# Patient Record
Sex: Female | Born: 1950 | Race: White | Hispanic: No | Marital: Married | Smoking: Former smoker
Health system: Southern US, Community
[De-identification: ages and names within clinical notes are randomized; demographics above are authoritative.]

## PROBLEM LIST (undated history)

## (undated) DIAGNOSIS — K219 Gastro-esophageal reflux disease without esophagitis: Secondary | ICD-10-CM

## (undated) DIAGNOSIS — I38 Endocarditis, valve unspecified: Secondary | ICD-10-CM

## (undated) DIAGNOSIS — N816 Rectocele: Secondary | ICD-10-CM

## (undated) DIAGNOSIS — F32A Depression, unspecified: Secondary | ICD-10-CM

## (undated) DIAGNOSIS — L259 Unspecified contact dermatitis, unspecified cause: Secondary | ICD-10-CM

## (undated) DIAGNOSIS — K449 Diaphragmatic hernia without obstruction or gangrene: Secondary | ICD-10-CM

## (undated) DIAGNOSIS — R0789 Other chest pain: Secondary | ICD-10-CM

## (undated) DIAGNOSIS — D3500 Benign neoplasm of unspecified adrenal gland: Secondary | ICD-10-CM

## (undated) DIAGNOSIS — R011 Cardiac murmur, unspecified: Secondary | ICD-10-CM

## (undated) DIAGNOSIS — T7840XA Allergy, unspecified, initial encounter: Secondary | ICD-10-CM

## (undated) DIAGNOSIS — F419 Anxiety disorder, unspecified: Secondary | ICD-10-CM

## (undated) DIAGNOSIS — L03032 Cellulitis of left toe: Secondary | ICD-10-CM

## (undated) DIAGNOSIS — K589 Irritable bowel syndrome without diarrhea: Secondary | ICD-10-CM

## (undated) DIAGNOSIS — E782 Mixed hyperlipidemia: Secondary | ICD-10-CM

## (undated) DIAGNOSIS — A0472 Enterocolitis due to Clostridium difficile, not specified as recurrent: Secondary | ICD-10-CM

## (undated) DIAGNOSIS — K902 Blind loop syndrome, not elsewhere classified: Secondary | ICD-10-CM

## (undated) DIAGNOSIS — E039 Hypothyroidism, unspecified: Secondary | ICD-10-CM

## (undated) DIAGNOSIS — K7689 Other specified diseases of liver: Secondary | ICD-10-CM

## (undated) DIAGNOSIS — E278 Other specified disorders of adrenal gland: Secondary | ICD-10-CM

## (undated) DIAGNOSIS — M533 Sacrococcygeal disorders, not elsewhere classified: Secondary | ICD-10-CM

## (undated) DIAGNOSIS — C801 Malignant (primary) neoplasm, unspecified: Secondary | ICD-10-CM

## (undated) DIAGNOSIS — B370 Candidal stomatitis: Secondary | ICD-10-CM

## (undated) DIAGNOSIS — F329 Major depressive disorder, single episode, unspecified: Secondary | ICD-10-CM

## (undated) DIAGNOSIS — R319 Hematuria, unspecified: Secondary | ICD-10-CM

## (undated) DIAGNOSIS — R197 Diarrhea, unspecified: Secondary | ICD-10-CM

## (undated) DIAGNOSIS — K862 Cyst of pancreas: Secondary | ICD-10-CM

## (undated) DIAGNOSIS — J45909 Unspecified asthma, uncomplicated: Secondary | ICD-10-CM

## (undated) DIAGNOSIS — B001 Herpesviral vesicular dermatitis: Secondary | ICD-10-CM

## (undated) DIAGNOSIS — N959 Unspecified menopausal and perimenopausal disorder: Secondary | ICD-10-CM

## (undated) DIAGNOSIS — K625 Hemorrhage of anus and rectum: Secondary | ICD-10-CM

## (undated) DIAGNOSIS — N814 Uterovaginal prolapse, unspecified: Secondary | ICD-10-CM

## (undated) DIAGNOSIS — E538 Deficiency of other specified B group vitamins: Secondary | ICD-10-CM

## (undated) DIAGNOSIS — I071 Rheumatic tricuspid insufficiency: Secondary | ICD-10-CM

## (undated) DIAGNOSIS — I10 Essential (primary) hypertension: Secondary | ICD-10-CM

## (undated) DIAGNOSIS — R51 Headache: Secondary | ICD-10-CM

## (undated) DIAGNOSIS — E559 Vitamin D deficiency, unspecified: Secondary | ICD-10-CM

## (undated) DIAGNOSIS — K2270 Barrett's esophagus without dysplasia: Secondary | ICD-10-CM

## (undated) DIAGNOSIS — K635 Polyp of colon: Secondary | ICD-10-CM

## (undated) DIAGNOSIS — N8111 Cystocele, midline: Secondary | ICD-10-CM

## (undated) DIAGNOSIS — K5790 Diverticulosis of intestine, part unspecified, without perforation or abscess without bleeding: Secondary | ICD-10-CM

## (undated) DIAGNOSIS — Z0000 Encounter for general adult medical examination without abnormal findings: Secondary | ICD-10-CM

## (undated) DIAGNOSIS — G4700 Insomnia, unspecified: Secondary | ICD-10-CM

## (undated) DIAGNOSIS — J019 Acute sinusitis, unspecified: Secondary | ICD-10-CM

## (undated) DIAGNOSIS — M545 Low back pain: Secondary | ICD-10-CM

## (undated) HISTORY — DX: Barrett's esophagus without dysplasia: K22.70

## (undated) HISTORY — DX: Enterocolitis due to Clostridium difficile, not specified as recurrent: A04.72

## (undated) HISTORY — DX: Diarrhea, unspecified: R19.7

## (undated) HISTORY — DX: Deficiency of other specified B group vitamins: E53.8

## (undated) HISTORY — DX: Unspecified menopausal and perimenopausal disorder: N95.9

## (undated) HISTORY — DX: Headache: R51

## (undated) HISTORY — DX: Uterovaginal prolapse, unspecified: N81.4

## (undated) HISTORY — DX: Other specified disorders of adrenal gland: E27.8

## (undated) HISTORY — DX: Polyp of colon: K63.5

## (undated) HISTORY — DX: Acute sinusitis, unspecified: J01.90

## (undated) HISTORY — DX: Diaphragmatic hernia without obstruction or gangrene: K44.9

## (undated) HISTORY — PX: APPENDECTOMY: SHX54

## (undated) HISTORY — PX: UPPER GI ENDOSCOPY: SHX6162

## (undated) HISTORY — DX: Other specified diseases of liver: K76.89

## (undated) HISTORY — DX: Rectocele: N81.6

## (undated) HISTORY — PX: HYSTEROSCOPY: SHX211

## (undated) HISTORY — DX: Endocarditis, valve unspecified: I38

## (undated) HISTORY — DX: Hypothyroidism, unspecified: E03.9

## (undated) HISTORY — DX: Low back pain: M54.5

## (undated) HISTORY — DX: Gastro-esophageal reflux disease without esophagitis: K21.9

## (undated) HISTORY — PX: UTERINE FIBROID SURGERY: SHX826

## (undated) HISTORY — DX: Vitamin D deficiency, unspecified: E55.9

## (undated) HISTORY — DX: Allergy, unspecified, initial encounter: T78.40XA

## (undated) HISTORY — DX: Rheumatic tricuspid insufficiency: I07.1

## (undated) HISTORY — DX: Diverticulosis of intestine, part unspecified, without perforation or abscess without bleeding: K57.90

## (undated) HISTORY — PX: OTHER SURGICAL HISTORY: SHX169

## (undated) HISTORY — DX: Unspecified contact dermatitis, unspecified cause: L25.9

## (undated) HISTORY — DX: Cystocele, midline: N81.11

## (undated) HISTORY — DX: Blind loop syndrome, not elsewhere classified: K90.2

## (undated) HISTORY — DX: Encounter for general adult medical examination without abnormal findings: Z00.00

## (undated) HISTORY — DX: Hemorrhage of anus and rectum: K62.5

## (undated) HISTORY — DX: Benign neoplasm of unspecified adrenal gland: D35.00

## (undated) HISTORY — DX: Insomnia, unspecified: G47.00

## (undated) HISTORY — DX: Anxiety disorder, unspecified: F41.9

## (undated) HISTORY — DX: Irritable bowel syndrome, unspecified: K58.9

## (undated) HISTORY — DX: Depression, unspecified: F32.A

## (undated) HISTORY — DX: Cellulitis of left toe: L03.032

## (undated) HISTORY — DX: Herpesviral vesicular dermatitis: B00.1

## (undated) HISTORY — DX: Hematuria, unspecified: R31.9

## (undated) HISTORY — DX: Candidal stomatitis: B37.0

## (undated) HISTORY — DX: Cyst of pancreas: K86.2

## (undated) HISTORY — DX: Other chest pain: R07.89

## (undated) HISTORY — DX: Major depressive disorder, single episode, unspecified: F32.9

## (undated) HISTORY — DX: Sacrococcygeal disorders, not elsewhere classified: M53.3

## (undated) HISTORY — DX: Mixed hyperlipidemia: E78.2

---

## 1998-07-03 ENCOUNTER — Other Ambulatory Visit: Admission: RE | Admit: 1998-07-03 | Discharge: 1998-07-03 | Payer: Self-pay | Admitting: Gynecology

## 1998-07-24 ENCOUNTER — Other Ambulatory Visit: Admission: RE | Admit: 1998-07-24 | Discharge: 1998-07-24 | Payer: Self-pay | Admitting: Gynecology

## 1999-09-24 ENCOUNTER — Other Ambulatory Visit: Admission: RE | Admit: 1999-09-24 | Discharge: 1999-09-24 | Payer: Self-pay | Admitting: Gynecology

## 2002-03-21 ENCOUNTER — Other Ambulatory Visit: Admission: RE | Admit: 2002-03-21 | Discharge: 2002-03-21 | Payer: Self-pay | Admitting: Gynecology

## 2002-08-22 ENCOUNTER — Emergency Department (HOSPITAL_COMMUNITY): Admission: EM | Admit: 2002-08-22 | Discharge: 2002-08-22 | Payer: Self-pay | Admitting: Emergency Medicine

## 2003-04-03 ENCOUNTER — Ambulatory Visit (HOSPITAL_COMMUNITY): Admission: RE | Admit: 2003-04-03 | Discharge: 2003-04-03 | Payer: Self-pay | Admitting: *Deleted

## 2003-04-03 ENCOUNTER — Encounter: Payer: Self-pay | Admitting: *Deleted

## 2004-09-04 ENCOUNTER — Other Ambulatory Visit: Admission: RE | Admit: 2004-09-04 | Discharge: 2004-09-04 | Payer: Self-pay | Admitting: Gynecology

## 2004-09-25 ENCOUNTER — Ambulatory Visit: Payer: Self-pay | Admitting: Family Medicine

## 2005-03-27 ENCOUNTER — Ambulatory Visit: Payer: Self-pay | Admitting: Family Medicine

## 2006-02-24 ENCOUNTER — Ambulatory Visit: Payer: Self-pay | Admitting: Family Medicine

## 2006-02-27 ENCOUNTER — Other Ambulatory Visit: Admission: RE | Admit: 2006-02-27 | Discharge: 2006-02-27 | Payer: Self-pay | Admitting: Gynecology

## 2006-08-06 ENCOUNTER — Ambulatory Visit: Payer: Self-pay | Admitting: Family Medicine

## 2006-11-18 ENCOUNTER — Ambulatory Visit: Payer: Self-pay | Admitting: Family Medicine

## 2007-03-16 ENCOUNTER — Other Ambulatory Visit: Admission: RE | Admit: 2007-03-16 | Discharge: 2007-03-16 | Payer: Self-pay | Admitting: Gynecology

## 2007-06-15 ENCOUNTER — Telehealth: Payer: Self-pay | Admitting: Family Medicine

## 2007-08-04 ENCOUNTER — Telehealth: Payer: Self-pay | Admitting: Family Medicine

## 2007-11-01 ENCOUNTER — Telehealth: Payer: Self-pay | Admitting: Family Medicine

## 2007-12-04 ENCOUNTER — Emergency Department (HOSPITAL_COMMUNITY): Admission: EM | Admit: 2007-12-04 | Discharge: 2007-12-05 | Payer: Self-pay | Admitting: Emergency Medicine

## 2007-12-07 ENCOUNTER — Ambulatory Visit: Payer: Self-pay | Admitting: Family Medicine

## 2007-12-07 LAB — CONVERTED CEMR LAB
Bilirubin Urine: NEGATIVE
Blood in Urine, dipstick: NEGATIVE
Glucose, Urine, Semiquant: NEGATIVE
Ketones, urine, test strip: NEGATIVE
Nitrite: NEGATIVE
Protein, U semiquant: NEGATIVE
Specific Gravity, Urine: <1.005
Urobilinogen, UA: 0.2
WBC Urine, dipstick: NEGATIVE
pH: 5.5

## 2007-12-14 ENCOUNTER — Telehealth: Payer: Self-pay | Admitting: Family Medicine

## 2007-12-14 ENCOUNTER — Ambulatory Visit: Payer: Self-pay | Admitting: Family Medicine

## 2007-12-14 DIAGNOSIS — G4700 Insomnia, unspecified: Secondary | ICD-10-CM | POA: Insufficient documentation

## 2007-12-14 DIAGNOSIS — F341 Dysthymic disorder: Secondary | ICD-10-CM

## 2008-02-09 ENCOUNTER — Telehealth: Payer: Self-pay | Admitting: Family Medicine

## 2008-02-14 ENCOUNTER — Telehealth: Payer: Self-pay | Admitting: Family Medicine

## 2008-02-15 ENCOUNTER — Telehealth: Payer: Self-pay | Admitting: Family Medicine

## 2008-03-29 ENCOUNTER — Telehealth: Payer: Self-pay | Admitting: Family Medicine

## 2008-04-09 ENCOUNTER — Emergency Department: Payer: Self-pay | Admitting: Emergency Medicine

## 2008-04-09 ENCOUNTER — Encounter: Payer: Self-pay | Admitting: Nurse Practitioner

## 2008-04-13 ENCOUNTER — Telehealth: Payer: Self-pay | Admitting: Family Medicine

## 2008-04-14 ENCOUNTER — Other Ambulatory Visit: Admission: RE | Admit: 2008-04-14 | Discharge: 2008-04-14 | Payer: Self-pay | Admitting: Gynecology

## 2008-04-26 ENCOUNTER — Telehealth: Payer: Self-pay | Admitting: Family Medicine

## 2008-04-27 ENCOUNTER — Ambulatory Visit: Payer: Self-pay | Admitting: Family Medicine

## 2008-04-27 ENCOUNTER — Telehealth: Payer: Self-pay | Admitting: Gastroenterology

## 2008-04-28 ENCOUNTER — Ambulatory Visit: Payer: Self-pay | Admitting: Gastroenterology

## 2008-05-06 ENCOUNTER — Encounter: Payer: Self-pay | Admitting: Gastroenterology

## 2008-05-08 ENCOUNTER — Ambulatory Visit: Payer: Self-pay | Admitting: Gastroenterology

## 2008-05-12 ENCOUNTER — Telehealth: Payer: Self-pay | Admitting: Gastroenterology

## 2008-05-18 ENCOUNTER — Telehealth: Payer: Self-pay | Admitting: Nurse Practitioner

## 2008-05-24 ENCOUNTER — Telehealth: Payer: Self-pay | Admitting: Family Medicine

## 2008-08-01 ENCOUNTER — Ambulatory Visit: Payer: Self-pay | Admitting: Family Medicine

## 2008-08-21 ENCOUNTER — Telehealth: Payer: Self-pay | Admitting: Family Medicine

## 2008-09-13 ENCOUNTER — Telehealth: Payer: Self-pay | Admitting: Gastroenterology

## 2008-09-14 DIAGNOSIS — K7689 Other specified diseases of liver: Secondary | ICD-10-CM

## 2008-09-15 ENCOUNTER — Encounter: Payer: Self-pay | Admitting: Cardiovascular Disease

## 2008-09-15 ENCOUNTER — Ambulatory Visit: Payer: Self-pay | Admitting: Gastroenterology

## 2008-09-15 ENCOUNTER — Ambulatory Visit: Payer: Self-pay | Admitting: Cardiovascular Disease

## 2008-09-15 ENCOUNTER — Ambulatory Visit: Payer: Self-pay

## 2008-09-18 ENCOUNTER — Ambulatory Visit: Payer: Self-pay | Admitting: Cardiovascular Disease

## 2008-09-19 ENCOUNTER — Telehealth: Payer: Self-pay | Admitting: Family Medicine

## 2008-10-03 ENCOUNTER — Telehealth: Payer: Self-pay | Admitting: Gastroenterology

## 2008-10-19 ENCOUNTER — Telehealth: Payer: Self-pay | Admitting: Family Medicine

## 2008-10-23 ENCOUNTER — Ambulatory Visit: Payer: Self-pay | Admitting: Family Medicine

## 2008-10-23 DIAGNOSIS — R609 Edema, unspecified: Secondary | ICD-10-CM

## 2008-10-23 DIAGNOSIS — E039 Hypothyroidism, unspecified: Secondary | ICD-10-CM | POA: Insufficient documentation

## 2008-10-23 LAB — CONVERTED CEMR LAB
Nitrite: NEGATIVE
Specific Gravity, Urine: 1.01

## 2008-10-24 ENCOUNTER — Telehealth: Payer: Self-pay | Admitting: Gastroenterology

## 2008-10-24 LAB — CONVERTED CEMR LAB
ALT: 50 U/L — ABNORMAL HIGH (ref 0–35)
AST: 38 U/L — ABNORMAL HIGH (ref 0–37)
Albumin: 4.3 g/dL (ref 3.5–5.2)
Alkaline Phosphatase: 104 U/L (ref 39–117)
Basophils Absolute: 0 K/uL (ref 0.0–0.1)
Basophils Relative: 0.3 % (ref 0.0–3.0)
Bilirubin, Direct: 0.1 mg/dL (ref 0.0–0.3)
Eosinophils Absolute: 0.1 K/uL (ref 0.0–0.7)
Eosinophils Relative: 2.2 % (ref 0.0–5.0)
HCT: 40.5 % (ref 36.0–46.0)
Hemoglobin: 14.2 g/dL (ref 12.0–15.0)
Lymphocytes Relative: 29.2 % (ref 12.0–46.0)
MCHC: 35.1 g/dL (ref 30.0–36.0)
MCV: 93 fL (ref 78.0–100.0)
Monocytes Absolute: 0.5 K/uL (ref 0.1–1.0)
Monocytes Relative: 9.4 % (ref 3.0–12.0)
Neutro Abs: 3.4 K/uL (ref 1.4–7.7)
Neutrophils Relative %: 58.9 % (ref 43.0–77.0)
Platelets: 231 K/uL (ref 150–400)
RBC: 4.35 M/uL (ref 3.87–5.11)
RDW: 12.3 % (ref 11.5–14.6)
TSH: 0.58 u[IU]/mL (ref 0.35–5.50)
Total Bilirubin: 0.9 mg/dL (ref 0.3–1.2)
Total Protein: 7.3 g/dL (ref 6.0–8.3)
WBC: 5.7 10*3/microliter (ref 4.5–10.5)

## 2008-10-25 ENCOUNTER — Telehealth: Payer: Self-pay | Admitting: Family Medicine

## 2008-10-26 ENCOUNTER — Telehealth: Payer: Self-pay | Admitting: Gastroenterology

## 2008-10-26 ENCOUNTER — Ambulatory Visit (HOSPITAL_COMMUNITY): Admission: RE | Admit: 2008-10-26 | Discharge: 2008-10-26 | Payer: Self-pay | Admitting: Gastroenterology

## 2008-11-14 ENCOUNTER — Telehealth: Payer: Self-pay | Admitting: Gastroenterology

## 2008-11-15 ENCOUNTER — Telehealth: Payer: Self-pay | Admitting: Family Medicine

## 2008-12-05 ENCOUNTER — Ambulatory Visit: Payer: Self-pay | Admitting: Family Medicine

## 2008-12-05 DIAGNOSIS — R197 Diarrhea, unspecified: Secondary | ICD-10-CM | POA: Insufficient documentation

## 2008-12-11 LAB — CONVERTED CEMR LAB
HDL: 45.1 mg/dL (ref >39.00)
VLDL: 28.8 mg/dL (ref 0.0–40.0)

## 2008-12-26 ENCOUNTER — Telehealth: Payer: Self-pay | Admitting: Gastroenterology

## 2008-12-28 ENCOUNTER — Telehealth: Payer: Self-pay | Admitting: Family Medicine

## 2009-01-04 ENCOUNTER — Ambulatory Visit: Payer: Self-pay | Admitting: Family Medicine

## 2009-01-04 DIAGNOSIS — I10 Essential (primary) hypertension: Secondary | ICD-10-CM | POA: Insufficient documentation

## 2009-01-05 ENCOUNTER — Ambulatory Visit: Payer: Self-pay | Admitting: Gynecology

## 2009-01-09 ENCOUNTER — Telehealth: Payer: Self-pay | Admitting: Family Medicine

## 2009-01-16 ENCOUNTER — Encounter: Admission: RE | Admit: 2009-01-16 | Discharge: 2009-01-16 | Payer: Self-pay | Admitting: Gynecology

## 2009-01-22 ENCOUNTER — Telehealth: Payer: Self-pay | Admitting: Family Medicine

## 2009-01-23 ENCOUNTER — Telehealth: Payer: Self-pay | Admitting: Family Medicine

## 2009-01-25 ENCOUNTER — Telehealth: Payer: Self-pay | Admitting: Gastroenterology

## 2009-01-26 ENCOUNTER — Ambulatory Visit: Payer: Self-pay | Admitting: Gastroenterology

## 2009-01-26 DIAGNOSIS — K219 Gastro-esophageal reflux disease without esophagitis: Secondary | ICD-10-CM | POA: Insufficient documentation

## 2009-01-26 DIAGNOSIS — R141 Gas pain: Secondary | ICD-10-CM | POA: Insufficient documentation

## 2009-01-26 DIAGNOSIS — K5730 Diverticulosis of large intestine without perforation or abscess without bleeding: Secondary | ICD-10-CM | POA: Insufficient documentation

## 2009-01-26 DIAGNOSIS — R143 Flatulence: Secondary | ICD-10-CM

## 2009-01-26 DIAGNOSIS — R142 Eructation: Secondary | ICD-10-CM

## 2009-02-13 ENCOUNTER — Ambulatory Visit: Payer: Self-pay | Admitting: Gastroenterology

## 2009-02-13 DIAGNOSIS — K902 Blind loop syndrome, not elsewhere classified: Secondary | ICD-10-CM | POA: Insufficient documentation

## 2009-02-14 LAB — CONVERTED CEMR LAB
ALT: 25 units/L (ref 0–35)
Alkaline Phosphatase: 101 units/L (ref 39–117)
Basophils Absolute: 0 10*3/uL (ref 0.0–0.1)
Bilirubin, Direct: 0.1 mg/dL (ref 0.0–0.3)
CO2: 30 meq/L (ref 19–32)
Chloride: 106 meq/L (ref 96–112)
Ferritin: 18.2 ng/mL (ref 10.0–291.0)
Folate: 16.5 ng/mL
Hemoglobin: 14 g/dL (ref 12.0–15.0)
Lymphocytes Relative: 34.3 % (ref 12.0–46.0)
Monocytes Relative: 8.7 % (ref 3.0–12.0)
Neutrophils Relative %: 53.3 % (ref 43.0–77.0)
Platelets: 251 10*3/uL (ref 150.0–400.0)
Potassium: 4.3 meq/L (ref 3.5–5.1)
RDW: 11.7 % (ref 11.5–14.6)
Saturation Ratios: 15.1 % — ABNORMAL LOW (ref 20.0–50.0)
Sed Rate: 10 mm/hr (ref 0–22)
Sodium: 143 meq/L (ref 135–145)
Total Protein: 7.6 g/dL (ref 6.0–8.3)
Transferrin: 346.1 mg/dL (ref 212.0–360.0)

## 2009-02-15 ENCOUNTER — Ambulatory Visit: Payer: Self-pay | Admitting: Gastroenterology

## 2009-02-15 DIAGNOSIS — E538 Deficiency of other specified B group vitamins: Secondary | ICD-10-CM

## 2009-02-23 ENCOUNTER — Ambulatory Visit: Payer: Self-pay | Admitting: Gastroenterology

## 2009-02-23 ENCOUNTER — Telehealth: Payer: Self-pay | Admitting: Family Medicine

## 2009-03-02 ENCOUNTER — Ambulatory Visit: Payer: Self-pay | Admitting: Gastroenterology

## 2009-03-06 ENCOUNTER — Emergency Department (HOSPITAL_COMMUNITY): Admission: EM | Admit: 2009-03-06 | Discharge: 2009-03-06 | Payer: Self-pay | Admitting: Emergency Medicine

## 2009-03-07 ENCOUNTER — Ambulatory Visit: Payer: Self-pay | Admitting: Family Medicine

## 2009-03-15 ENCOUNTER — Telehealth: Payer: Self-pay | Admitting: Gastroenterology

## 2009-03-15 ENCOUNTER — Telehealth: Payer: Self-pay | Admitting: Family Medicine

## 2009-03-29 ENCOUNTER — Emergency Department (HOSPITAL_COMMUNITY): Admission: EM | Admit: 2009-03-29 | Discharge: 2009-03-29 | Payer: Self-pay | Admitting: Emergency Medicine

## 2009-04-02 ENCOUNTER — Ambulatory Visit: Payer: Self-pay | Admitting: Family Medicine

## 2009-04-03 ENCOUNTER — Ambulatory Visit: Payer: Self-pay | Admitting: Cardiovascular Disease

## 2009-04-05 ENCOUNTER — Ambulatory Visit: Payer: Self-pay | Admitting: Gastroenterology

## 2009-04-06 ENCOUNTER — Other Ambulatory Visit: Admission: RE | Admit: 2009-04-06 | Discharge: 2009-04-06 | Payer: Self-pay | Admitting: Gynecology

## 2009-04-06 ENCOUNTER — Telehealth: Payer: Self-pay | Admitting: Family Medicine

## 2009-04-06 ENCOUNTER — Ambulatory Visit: Payer: Self-pay | Admitting: Gynecology

## 2009-04-09 ENCOUNTER — Ambulatory Visit: Payer: Self-pay | Admitting: Gynecology

## 2009-04-09 ENCOUNTER — Telehealth: Payer: Self-pay | Admitting: Family Medicine

## 2009-04-16 ENCOUNTER — Ambulatory Visit: Payer: Self-pay | Admitting: Gynecology

## 2009-04-30 ENCOUNTER — Telehealth: Payer: Self-pay | Admitting: Family Medicine

## 2009-05-17 ENCOUNTER — Ambulatory Visit: Payer: Self-pay | Admitting: Cardiovascular Disease

## 2009-05-24 ENCOUNTER — Telehealth: Payer: Self-pay | Admitting: Gastroenterology

## 2009-06-05 ENCOUNTER — Ambulatory Visit: Payer: Self-pay | Admitting: Family Medicine

## 2009-06-05 DIAGNOSIS — R635 Abnormal weight gain: Secondary | ICD-10-CM | POA: Insufficient documentation

## 2009-06-05 DIAGNOSIS — R739 Hyperglycemia, unspecified: Secondary | ICD-10-CM | POA: Insufficient documentation

## 2009-06-05 LAB — CONVERTED CEMR LAB
Blood in Urine, dipstick: NEGATIVE
Ketones, urine, test strip: NEGATIVE
Nitrite: NEGATIVE
Protein, U semiquant: NEGATIVE
WBC Urine, dipstick: NEGATIVE
pH: 7

## 2009-06-07 ENCOUNTER — Telehealth: Payer: Self-pay | Admitting: Family Medicine

## 2009-06-07 LAB — CONVERTED CEMR LAB
Basophils Absolute: 0.1 10*3/uL (ref 0.0–0.1)
Basophils Relative: 0.5 % (ref 0.0–3.0)
Eosinophils Absolute: 0.3 10*3/uL (ref 0.0–0.7)
Eosinophils Relative: 2.2 % (ref 0.0–5.0)
HCT: 41.7 % (ref 36.0–46.0)
Hemoglobin: 14.1 g/dL (ref 12.0–15.0)
Hgb A1c MFr Bld: 5.2 % (ref 4.6–6.5)
Lymphocytes Relative: 31.1 % (ref 12.0–46.0)
Lymphs Abs: 3.7 10*3/uL (ref 0.7–4.0)
MCHC: 33.7 g/dL (ref 30.0–36.0)
MCV: 95.3 fL (ref 78.0–100.0)
Monocytes Absolute: 1.1 10*3/uL — ABNORMAL HIGH (ref 0.1–1.0)
Monocytes Relative: 9.1 % (ref 3.0–12.0)
Neutro Abs: 6.6 10*3/uL (ref 1.4–7.7)
Neutrophils Relative %: 57.1 % (ref 43.0–77.0)
Platelets: 308 10*3/uL (ref 150.0–400.0)
RBC: 4.38 M/uL (ref 3.87–5.11)
RDW: 11.8 % (ref 11.5–14.6)
TSH: 0.78 microintl units/mL (ref 0.35–5.50)
Vitamin B-12: 374 pg/mL (ref 211–911)
WBC: 9 10*3/uL (ref 4.5–10.5)

## 2009-06-12 ENCOUNTER — Telehealth: Payer: Self-pay | Admitting: Family Medicine

## 2009-06-13 ENCOUNTER — Ambulatory Visit: Payer: Self-pay | Admitting: Family Medicine

## 2009-06-16 ENCOUNTER — Telehealth: Payer: Self-pay | Admitting: Family Medicine

## 2009-07-16 ENCOUNTER — Telehealth: Payer: Self-pay | Admitting: Family Medicine

## 2009-07-16 ENCOUNTER — Ambulatory Visit: Payer: Self-pay | Admitting: Gynecology

## 2009-07-27 ENCOUNTER — Telehealth: Payer: Self-pay | Admitting: Gastroenterology

## 2009-07-29 ENCOUNTER — Emergency Department (HOSPITAL_COMMUNITY): Admission: EM | Admit: 2009-07-29 | Discharge: 2009-07-29 | Payer: Self-pay | Admitting: Emergency Medicine

## 2009-07-30 ENCOUNTER — Telehealth (INDEPENDENT_AMBULATORY_CARE_PROVIDER_SITE_OTHER): Payer: Self-pay | Admitting: *Deleted

## 2009-07-30 ENCOUNTER — Ambulatory Visit: Payer: Self-pay | Admitting: Gastroenterology

## 2009-09-16 ENCOUNTER — Encounter: Payer: Self-pay | Admitting: Family Medicine

## 2009-09-20 ENCOUNTER — Telehealth: Payer: Self-pay | Admitting: Gastroenterology

## 2009-10-12 ENCOUNTER — Telehealth: Payer: Self-pay | Admitting: Family Medicine

## 2009-11-15 ENCOUNTER — Ambulatory Visit: Payer: Self-pay | Admitting: Family Medicine

## 2009-11-15 DIAGNOSIS — C44300 Unspecified malignant neoplasm of skin of unspecified part of face: Secondary | ICD-10-CM | POA: Insufficient documentation

## 2009-11-15 DIAGNOSIS — C44310 Basal cell carcinoma of skin of unspecified parts of face: Secondary | ICD-10-CM | POA: Insufficient documentation

## 2009-11-15 DIAGNOSIS — Z85828 Personal history of other malignant neoplasm of skin: Secondary | ICD-10-CM | POA: Insufficient documentation

## 2009-11-15 DIAGNOSIS — G43909 Migraine, unspecified, not intractable, without status migrainosus: Secondary | ICD-10-CM | POA: Insufficient documentation

## 2009-11-21 ENCOUNTER — Telehealth: Payer: Self-pay | Admitting: Family Medicine

## 2009-11-22 ENCOUNTER — Telehealth: Payer: Self-pay | Admitting: Family Medicine

## 2009-11-29 ENCOUNTER — Encounter: Payer: Self-pay | Admitting: Family Medicine

## 2009-11-29 LAB — CONVERTED CEMR LAB
Basophils Relative: 0.6 % (ref 0.0–3.0)
Eosinophils Relative: 2.7 % (ref 0.0–5.0)
HCT: 41.6 % (ref 36.0–46.0)
Hemoglobin: 14.5 g/dL (ref 12.0–15.0)
Lymphs Abs: 2.2 10*3/uL (ref 0.7–4.0)
MCV: 92.9 fL (ref 78.0–100.0)
Monocytes Absolute: 0.4 10*3/uL (ref 0.1–1.0)
Monocytes Relative: 7.8 % (ref 3.0–12.0)
Neutro Abs: 2.7 10*3/uL (ref 1.4–7.7)
Platelets: 284 10*3/uL (ref 150.0–400.0)
RBC: 4.48 M/uL (ref 3.87–5.11)
WBC: 5.4 10*3/uL (ref 4.5–10.5)

## 2009-12-11 ENCOUNTER — Telehealth: Payer: Self-pay | Admitting: Family Medicine

## 2009-12-13 ENCOUNTER — Telehealth: Payer: Self-pay | Admitting: Family Medicine

## 2009-12-20 ENCOUNTER — Telehealth: Payer: Self-pay | Admitting: Family Medicine

## 2009-12-21 ENCOUNTER — Telehealth: Payer: Self-pay | Admitting: Gastroenterology

## 2009-12-25 ENCOUNTER — Encounter (INDEPENDENT_AMBULATORY_CARE_PROVIDER_SITE_OTHER): Payer: Self-pay | Admitting: *Deleted

## 2009-12-25 ENCOUNTER — Ambulatory Visit: Payer: Self-pay | Admitting: Gastroenterology

## 2009-12-25 LAB — CONVERTED CEMR LAB
Albumin: 4.4 g/dL (ref 3.5–5.2)
Alkaline Phosphatase: 102 units/L (ref 39–117)
Basophils Absolute: 0 10*3/uL (ref 0.0–0.1)
Bilirubin, Direct: 0.1 mg/dL (ref 0.0–0.3)
CO2: 29 meq/L (ref 19–32)
Calcium: 9.4 mg/dL (ref 8.4–10.5)
Creatinine, Ser: 0.5 mg/dL (ref 0.4–1.2)
Ferritin: 18 ng/mL (ref 10.0–291.0)
Folate: 16.7 ng/mL
GFR calc non Af Amer: 130.99 mL/min (ref >60)
HCT: 41.1 % (ref 36.0–46.0)
Hemoglobin: 14.1 g/dL (ref 12.0–15.0)
IgA: 297 mg/dL (ref 68–378)
Lipase: 23 units/L (ref 11.0–59.0)
Lymphs Abs: 2 10*3/uL (ref 0.7–4.0)
MCHC: 34.3 g/dL (ref 30.0–36.0)
MCV: 92.8 fL (ref 78.0–100.0)
Magnesium: 2.1 mg/dL (ref 1.5–2.5)
Monocytes Absolute: 0.6 10*3/uL (ref 0.1–1.0)
Monocytes Relative: 9.2 % (ref 3.0–12.0)
Neutro Abs: 3.4 10*3/uL (ref 1.4–7.7)
Platelets: 277 10*3/uL (ref 150.0–400.0)
RDW: 12.6 % (ref 11.5–14.6)
Saturation Ratios: 13.5 % — ABNORMAL LOW (ref 20.0–50.0)
Sodium: 144 meq/L (ref 135–145)
Tissue Transglutaminase Ab, IgA: 4.5 units (ref <20)
Total Protein: 7.5 g/dL (ref 6.0–8.3)
Vitamin B-12: 543 pg/mL (ref 211–911)

## 2009-12-26 ENCOUNTER — Ambulatory Visit: Payer: Self-pay | Admitting: Gastroenterology

## 2009-12-26 LAB — HM COLONOSCOPY

## 2009-12-28 ENCOUNTER — Telehealth: Payer: Self-pay | Admitting: Gastroenterology

## 2009-12-31 ENCOUNTER — Encounter: Payer: Self-pay | Admitting: Gastroenterology

## 2010-01-09 ENCOUNTER — Telehealth: Payer: Self-pay | Admitting: Gastroenterology

## 2010-01-22 ENCOUNTER — Telehealth: Payer: Self-pay | Admitting: Family Medicine

## 2010-01-28 ENCOUNTER — Telehealth: Payer: Self-pay | Admitting: Family Medicine

## 2010-02-04 LAB — CONVERTED CEMR LAB: UREASE: NEGATIVE

## 2010-02-25 ENCOUNTER — Emergency Department (HOSPITAL_COMMUNITY)
Admission: EM | Admit: 2010-02-25 | Discharge: 2010-02-25 | Payer: Self-pay | Source: Home / Self Care | Admitting: Emergency Medicine

## 2010-03-26 ENCOUNTER — Telehealth: Payer: Self-pay | Admitting: Family Medicine

## 2010-04-18 ENCOUNTER — Ambulatory Visit: Payer: Self-pay | Admitting: Family Medicine

## 2010-04-23 ENCOUNTER — Telehealth: Payer: Self-pay | Admitting: Family Medicine

## 2010-05-01 ENCOUNTER — Ambulatory Visit: Payer: Self-pay | Admitting: Family Medicine

## 2010-05-02 ENCOUNTER — Telehealth: Payer: Self-pay | Admitting: Family Medicine

## 2010-05-03 ENCOUNTER — Ambulatory Visit: Payer: Self-pay | Admitting: Cardiology

## 2010-05-03 LAB — CONVERTED CEMR LAB
Basophils Absolute: 0 10*3/uL (ref 0.0–0.1)
Eosinophils Absolute: 0.3 10*3/uL (ref 0.0–0.7)
Lymphocytes Relative: 31 % (ref 12.0–46.0)
MCHC: 34.1 g/dL (ref 30.0–36.0)
Monocytes Relative: 8.5 % (ref 3.0–12.0)
Neutrophils Relative %: 56.5 % (ref 43.0–77.0)
Platelets: 269 10*3/uL (ref 150.0–400.0)
RDW: 12.9 % (ref 11.5–14.6)
Sed Rate: 13 mm/hr (ref 0–22)

## 2010-05-14 ENCOUNTER — Other Ambulatory Visit: Admission: RE | Admit: 2010-05-14 | Discharge: 2010-05-14 | Payer: Self-pay | Admitting: Gynecology

## 2010-05-14 ENCOUNTER — Ambulatory Visit: Payer: Self-pay | Admitting: Gynecology

## 2010-05-17 ENCOUNTER — Telehealth: Payer: Self-pay | Admitting: Family Medicine

## 2010-05-29 ENCOUNTER — Telehealth: Payer: Self-pay | Admitting: Family Medicine

## 2010-06-11 ENCOUNTER — Telehealth: Payer: Self-pay | Admitting: Family Medicine

## 2010-06-13 ENCOUNTER — Telehealth: Payer: Self-pay | Admitting: Family Medicine

## 2010-07-08 ENCOUNTER — Telehealth: Payer: Self-pay | Admitting: Gastroenterology

## 2010-08-16 ENCOUNTER — Ambulatory Visit
Admission: RE | Admit: 2010-08-16 | Discharge: 2010-08-16 | Payer: Self-pay | Source: Home / Self Care | Attending: Family Medicine | Admitting: Family Medicine

## 2010-09-10 ENCOUNTER — Ambulatory Visit
Admission: RE | Admit: 2010-09-10 | Discharge: 2010-09-10 | Payer: Self-pay | Source: Home / Self Care | Attending: Family Medicine | Admitting: Family Medicine

## 2010-09-10 DIAGNOSIS — N959 Unspecified menopausal and perimenopausal disorder: Secondary | ICD-10-CM | POA: Insufficient documentation

## 2010-09-12 ENCOUNTER — Telehealth: Payer: Self-pay | Admitting: Family Medicine

## 2010-09-16 ENCOUNTER — Telehealth: Payer: Self-pay | Admitting: Gastroenterology

## 2010-09-17 NOTE — Assessment & Plan Note (Signed)
Summary: ?ear inf/drainage/cjr   Vital Signs:  Patient profile:   60 year old female Height:      65 inches (165.10 cm) Weight:      149.31 pounds (67.87 kg) O2 Sat:      98 % on Room air Temp:     98.3 degrees F (36.83 degrees C) oral Pulse rate:   85 / minute BP sitting:   150 / 80  (left arm) Cuff size:   regular  Vitals Entered By: Josph Macho RMA (April 18, 2010 10:12 AM)  O2 Flow:  Room air CC: possible left ear infection/ drainage X3 days/ CF Is Patient Diabetic? No   History of Present Illness: Patient in today for pain and discharge from left ear. She has had trouble with this ear in the past. Noting OM, tinnitus previously. No previous discharge. Noted some minor chills this am. No fevers/malaise/HA/CP/cough/palp/SOB. She was having a diverticular flare earlier in the week with abdominal cramping/loose stool so she started taking some Ciprofloxacin 500mg  she had at home and her abdominal pain is better. No loose stool at present. She is under a great deal of stress helping to care for her 3 young grandchildren and notes her BP has been running hi. The highest she has seen is 169/98 the lowest was 116/79. More often the numbers are at the higher end. She is requesting a refill on her Hydrocodone for management of chronic pain and migraines. She denies taking them on a daily basis.  Current Medications (verified): 1)  Alprazolam 2 Mg Tabs (Alprazolam) .Marland Kitchen.. 1 By Mouth Three Times A Day As Needed Stress 2)  Hydrocodone-Acetaminophen 10-650 Mg Tabs (Hydrocodone-Acetaminophen) .Marland Kitchen.. 1 Q4hu As Needed For Pain or Headache No More Than 4 Per Day 3)  Vitamin D 2000 Unit Tabs (Cholecalciferol) .... One Tablet By Mouth Once Daily 4)  Vitamin E 400 Unit Caps (Vitamin E) .... One Capsule By Mouth Once Daily 5)  Calcium Gluconate 650 Mg Tabs (Calcium Gluconate) .... Once Daily 6)  B Complex  Tabs (B Complex Vitamins) .... One Tablet By Mouth Once Daily 7)  Ciprofloxacin Hcl 500 Mg  Tabs (Ciprofloxacin Hcl) .... Two Times A Day 8)  Temazepam 30 Mg Caps (Temazepam) .Marland Kitchen.. 1 By Mouth As Needed Sleep  Allergies (verified): 1)  ! Sulfamethoxazole (Sulfamethoxazole) 2)  ! * Iv Dye  Past History:  Past medical history reviewed for relevance to current acute and chronic problems. Social history (including risk factors) reviewed for relevance to current acute and chronic problems.  Past Medical History: Reviewed history from 05/17/2009 and no changes required. Current Problems:  GERD IBS ANXIETY/DEPRESSION DIVERTICULOSIS INSOMNIA (ICD-780.52) UTI (ICD-599.0) Chronic chest pain  Social History: Reviewed history from 05/16/2009 and no changes required. Occupation:  Retired Patient is a former smoker. -stopped 6 years ago Alcohol Use - no Illicit Drug Use - no Patient does not get regular exercise.  Daily Caffeine Use: 1 cup daily of coffee  Review of Systems      See HPI  Physical Exam  General:  Well-developed,well-nourished,in no acute distress; alert,appropriate and cooperative throughout examination Head:  Normocephalic and atraumatic without obvious abnormalities. No apparent alopecia or balding. Eyes:  No corneal or conjunctival inflammation noted. EOMI.  Ears:  left canal is mildly edematous and with some serous drainage. TM mildly dull and erythematous. Right ear unremarkable Nose:  External nasal examination shows no deformity or inflammation. Nasal mucosa are pink and moist without lesions or exudates. Mouth:  Oral mucosa  and oropharynx without lesions or exudates.  Teeth in good repair. Neck:  No deformities, masses, or tenderness noted. Lungs:  Normal respiratory effort, chest expands symmetrically. Lungs are clear to auscultation, no crackles or wheezes. Heart:  Normal rate and regular rhythm. S1 and S2 normal without gallop, murmur, click, rub or other extra sounds. Abdomen:  Bowel sounds positive,abdomen soft and non-tender without masses,  organomegaly or hernias noted. Extremities:  No clubbing, cyanosis, edema, or deformity noted   Cervical Nodes:  No lymphadenopathy noted Psych:  Cognition and judgment appear intact. Alert and cooperative with normal attention span and concentration. No apparent delusions, illusions, hallucinations   Impression & Recommendations:  Problem # 1:  OTITIS EXTERNA, ACUTE, LEFT (ICD-380.12) Ciprofloxacin two times a day and avoid water in the ears  Problem # 2:  HYPERTENSION (ICD-401.9)  Her updated medication list for this problem includes:    Amlodipine Besylate 2.5 Mg Tabs (Amlodipine besylate) .Marland Kitchen... 1 tab by mouth once daily recheck bp in 6/8 weeks  Problem # 3:  DIVERTICULOSIS-COLON (ICD-562.10) Encouraged daily probiotic and fiber supplements  Complete Medication List: 1)  Alprazolam 2 Mg Tabs (Alprazolam) .Marland Kitchen.. 1 by mouth three times a day as needed stress 2)  Hydrocodone-acetaminophen 10-650 Mg Tabs (Hydrocodone-acetaminophen) .Marland Kitchen.. 1 q4hu as needed for pain or headache no more than 4 per day 3)  Vitamin D 2000 Unit Tabs (Cholecalciferol) .... One tablet by mouth once daily 4)  Vitamin E 400 Unit Caps (Vitamin e) .... One capsule by mouth once daily 5)  Calcium Gluconate 650 Mg Tabs (Calcium gluconate) .... Once daily 6)  B Complex Tabs (B complex vitamins) .... One tablet by mouth once daily 7)  Ciprofloxacin Hcl 500 Mg Tabs (Ciprofloxacin hcl) .... Two times a day 8)  Temazepam 30 Mg Caps (Temazepam) .Marland Kitchen.. 1 by mouth as needed sleep 9)  Ciprofloxacin Hcl 500 Mg Tabs (Ciprofloxacin hcl) .Marland Kitchen.. 1 tab by mouth two times a day x 10 days 10)  Amlodipine Besylate 2.5 Mg Tabs (Amlodipine besylate) .Marland Kitchen.. 1 tab by mouth once daily  Patient Instructions: 1)  Please schedule a follow-up appointment in 6-8  weeks.  2)  Limit your Sodium(salt) .  3)  Take your antibiotic as prescribed until ALL of it is gone, but stop if you develop a rash or swelling and contact our office as soon as possible.    4)  Take a probiotic such as Align caps daily or Activia yogurt daily whenever you take an antibiotic 5)  Consider a fiber supplement daily to prevent diverticular flares. 6)  Benefiber powder or Metamucil are 2 good brands Prescriptions: AMLODIPINE BESYLATE 2.5 MG TABS (AMLODIPINE BESYLATE) 1 tab by mouth once daily  #30 x 1   Entered and Authorized by:   Danise Edge MD   Signed by:   Danise Edge MD on 04/18/2010   Method used:   Electronically to        [facility] (retail)       [address] [place], [place]  [address]       Ph: [phone]       Fax: [phone]   RxID:   [id number] HYDROCODONE-ACETAMINOPHEN 10-650 MG TABS (HYDROCODONE-ACETAMINOPHEN) 1 q4hu as needed for pain or headache no more than 4 per day  #60 x 1   Entered and Authorized by:   Danise Edge MD   Signed by:   Misty Stanley  Abner Greenspan MD on 04/18/2010   Method used:   Print then Give to Patient   RxID:   [id number] CIPROFLOXACIN HCL 500 MG TABS (CIPROFLOXACIN HCL) 1 tab by mouth two times a day x 10 days  #20 x 0   Entered and Authorized by:   Danise Edge MD   Signed by:   Danise Edge MD on 04/18/2010   Method used:   Electronically to        [facility] (retail)       [address] [place], [place]  [address]       Ph: [phone]       Fax: [phone]   RxID:   [id number]

## 2010-09-17 NOTE — Progress Notes (Signed)
Summary: meds?  Phone Note Call from Patient Call back at Home Phone [phone]   Caller: Patient Call For: Dr. Jarold Motto Reason for Call: Talk to Nurse Summary of Call: would like to know what meds she needs to be taking Initial call taken by: Vallarie Mare,  Dec 28, 2009 9:36 AM  Follow-up for Phone Call        Pt reporting.  Nexium was increased to two times a day at last OV.  This is not helping symptoms.  Did not sleep last pm sue to reflux.  Pt asking if there is something else that she can try?  had been on protonix prior to starting nexim and it did not help either. Follow-up by: Ashok Cordia RN,  Dec 28, 2009 10:57 AM  Additional Follow-up for Phone Call Additional follow up Details #1::        nexium qam..The biopsies taken during your recent procedure are pending.....  You should continue to follow the recommendations that we discussed at the time of the procedure.     Please feel free to call if you have any questions or concerns.. pending Additional Follow-up by: Mardella Layman MD FACG,  Dec 28, 2009 11:46 AM    Additional Follow-up for Phone Call Additional follow up Details #2::    Called pt to inform her of reply from this am.  Pt states that since she ate lunch today, she has developed LLQ pain,  Abd swelling, dairrhea.  Cont's to have severe heartburn.  Pt feels like she is starting with a flare up of diverticulitis. Temp 99.2. Follow-up by: Ashok Cordia RN,  Dec 28, 2009 2:34 PM  Additional Follow-up for Phone Call Additional follow up Details #3:: Details for Additional Follow-up Action Taken: CIPRO 500 MG two times a day FOR 10 DAYS.... Additional Follow-up by: Mardella Layman MD Clementeen Graham,  Dec 28, 2009 2:49 PM  New/Updated Medications: CIPROFLOXACIN HCL 500 MG TABS (CIPROFLOXACIN HCL) two times a day Prescriptions: CIPROFLOXACIN HCL 500 MG TABS (CIPROFLOXACIN HCL) two times a day  #20 x 0   Entered by:   Ashok Cordia RN   Authorized by:   Mardella Layman MD [facility]   Signed by:   Ashok Cordia RN on 12/28/2009   Method used:   Electronically to        [facility] (retail)       [address] [place], [place]  [address]       Ph: [phone]       Fax: [phone]   RxID:   [id number]   Appended Document: meds? Pt notified.Rx sent in.   Pt instructed to go to ER if worsens.  Pt instructed to remain on clear liquid diet for a few days.

## 2010-09-17 NOTE — Progress Notes (Signed)
Summary: rx hydrocodone   Phone Note From Pharmacy   Caller: [facility]  [address] [place] [phone]* Reason for Call: Needs renewal Summary of Call: hydrocodoen 10/650  [facility]  Initial call taken by: Pura Spice, RN,  December 13, 2009 2:27 PM  Follow-up for Phone Call        ok with 5 refills per dr Scotty Court.  Follow-up by: Pura Spice, RN,  December 13, 2009 2:28 PM    New/Updated Medications: HYDROCODONE-ACETAMINOPHEN 10-650 MG TABS (HYDROCODONE-ACETAMINOPHEN) 1 q4hu as needed for pain or headache no more than 4 per day Prescriptions: HYDROCODONE-ACETAMINOPHEN 10-650 MG TABS (HYDROCODONE-ACETAMINOPHEN) 1 q4hu as needed for pain or headache no more than 4 per day  #60 x 5   Entered by:   Pura Spice, RN   Authorized by:   Judithann Sheen MD   Signed by:   Pura Spice, RN on 12/13/2009   Method used:   Telephoned to ...       [facility] [phone]* (retail)       [address] [place], [place]  [address]       Ph: [phone]       Fax: [phone]   RxID:   [id number]

## 2010-09-17 NOTE — Progress Notes (Signed)
Summary: ear pain  Phone Note Call from Patient   Caller: Patient Call For: Megan Sheen MD Summary of Call: Pt is calling to tell Dr. Abner Greenspan she is on the 7 th day of antibiotics, but is still having severe ear pain.  Asking for some kind of drops that may help? [facility] [phone] Initial call taken by: Lynann Beaver CMA,  April 23, 2010 3:53 PM  Follow-up for Phone Call        Auralgan drops 2-4 drops q 3-4 hours as needed pain in b/l ears Disp 1 bottle Follow-up by: Danise Edge MD,  April 23, 2010 4:49 PM    New/Updated Medications: Lyla Son DROPS 2-4 drops q 3-4 hours as needed pain in b/l ears Prescriptions: AURALGAN DROPS 2-4 drops q 3-4 hours as needed pain in b/l ears  #1 bottle x 0   Entered by:   Josph Macho RMA   Authorized by:   Danise Edge MD   Signed by:   Josph Macho RMA on 04/23/2010   Method used:   Faxed to ...       [facility] [phone]* (retail)       [address] [place], [place]  [address]       Ph: [phone]       Fax: [phone]   RxID:   [id number]

## 2010-09-17 NOTE — Letter (Signed)
Summary: [facility]  [facility]   Imported By: Maryln Gottron 09/18/2009 13:42:33  _____________________________________________________________________  External Attachment:    Type:   Image     Comment:   External Document  Appended Document: [facility] reviewed

## 2010-09-17 NOTE — Progress Notes (Signed)
Summary: rx alprazolam   Phone Note From Pharmacy   Caller: [facility]  [address] [place] [phone]* Reason for Call: Needs renewal Summary of Call: refill alprazolam  last ov 11-15-2009  Initial call taken by: Pura Spice, RN,  Dec 20, 2009 8:43 AM  Follow-up for Phone Call        call in 2mg  three times a day as needed anxiety, #30 with no rf Follow-up by: Nelwyn Salisbury MD,  Dec 20, 2009 11:28 AM    New/Updated Medications: ALPRAZOLAM 2 MG TABS (ALPRAZOLAM) 1 by mouth three times a day as needed stress Prescriptions: ALPRAZOLAM 2 MG TABS (ALPRAZOLAM) 1 by mouth three times a day as needed stress  #30 x 0   Entered by:   Pura Spice, RN   Authorized by:   Nelwyn Salisbury MD   Signed by:   Pura Spice, RN on 12/20/2009   Method used:   Telephoned to ...       [facility] [phone]* (retail)       [address] [place], [place]  [address]       Ph: [phone]       Fax: [phone]   RxID:   [id number]

## 2010-09-17 NOTE — Progress Notes (Signed)
Summary: garbled - hear transferred vm with this  Phone Note Call from Patient Call back at [phone]   Summary of Call: Sleep med request.  Had me on something - ?temazepam.  Tired.  Don't sleep at night.  [facility] [phone]  Initial call taken by: Rudy Jew, RN,  March 26, 2010 1:52 PM  Follow-up for Phone Call        temazepam 30 mg called in  Follow-up by: Pura Spice, RN,  March 26, 2010 5:22 PM    New/Updated Medications: TEMAZEPAM 30 MG CAPS (TEMAZEPAM) 1 by mouth as needed sleep Prescriptions: TEMAZEPAM 30 MG CAPS (TEMAZEPAM) 1 by mouth as needed sleep  #30 x 5   Entered by:   Pura Spice, RN   Authorized by:   Judithann Sheen MD   Signed by:   Pura Spice, RN on 03/26/2010   Method used:   Telephoned to ...       [facility] [phone]* (retail)       [address] [place], [place]  [address]       Ph: [phone]       Fax: [phone]   RxID:   [id number]

## 2010-09-17 NOTE — Letter (Signed)
Summary: Patient Notice- Colon Biospy Results  [facility]  [address] [place], [place] [address]   Phone: [phone]  Fax: [phone]        Dec 31, 2009 MRN: [id number]    AYLEAH HOFMEISTER [address] [facility] [address] [place], [place]  [address]    Dear Ms. KAYES,  I am pleased to inform you that the biopsies taken during your recent colonoscopy did not show any evidence of cancer upon pathologic examination.  Additional information/recommendations:  __No further action is needed at this time.  Please follow-up with      your primary care physician for your other healthcare needs.  __Please call [phone] to schedule a return visit to review      your condition.  X__Continue with the treatment plan as outlined on the day of your      exam.  __You should have a repeat colonoscopy examination for this problem           in _ years.  Please call us if you are having persistent problems or have questions about your condition that have not been fully answered at this time.  Sincerely,  Mardella Layman MD [facility]   This letter has been electronically signed by your physician.  Appended Document: Patient Notice- Colon Biospy Results letter mailed  Appended Document: Patient Notice- Colon Biospy Results review

## 2010-09-17 NOTE — Assessment & Plan Note (Signed)
Summary: H/A // RS/PTS SPOUSE RSC/CJR   Vital Signs:  Patient profile:   60 year old female Weight:      155 pounds BMI:     25.89 O2 Sat:      98 % Temp:     98 degrees F Pulse rate:   77 / minute BP sitting:   150 / 92  (left arm)  Vitals Entered By: Pura Spice, RN (November 15, 2009 10:31 AM) CC: c/o left side headache nausea  ck area on left of face.    History of Present Illness: This 60 year old white married female who has had migraine headaches in the past but had improved but now has had a recurrent problem over the past month. She complains of pain in the left side of his in the left eye and has associated nausea. She has never been to the headache clinic he had 5 natural problem or no insurance. She is under considerable pressure with a debilitated husband, no jobs and financial problems. She complains of clicking in the left ear at times She has a small lesion over the left cheek which have been there for several months but will not get well She continued to have problems with abdominal bloating and abdominal pain. The problem with chronic diarrhea has subsided and she does no longer hasfecal incontinence She relates she is standing weight despite the fact she is controlling her diet but is not able to exercise as much as previously so we will check a TSH thyroid function blood pressure not under control and patient has not been taking medication  Allergies: 1)  ! Sulfamethoxazole (Sulfamethoxazole) 2)  ! 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 -stopped 6 years ago Alcohol Use -  no Illicit Drug Use - no Patient does not get regular exercise.  Daily Caffeine Use: 1 cup daily of coffee  Risk Factors: Smoking Status: quit (04/28/2008)  Review of Systems  The patient denies anorexia, fever, weight loss, weight gain, vision loss, decreased hearing, hoarseness, chest pain, syncope, dyspnea on exertion, peripheral edema, prolonged cough, headaches, hemoptysis, abdominal pain, melena, hematochezia, severe indigestion/heartburn, hematuria, incontinence, genital sores, muscle weakness, suspicious skin lesions, transient blindness, difficulty walking, depression, unusual weight change, abnormal bleeding, enlarged lymph nodes, angioedema, breast masses, and testicular masses.    Physical Exam  General:  Well-developed,well-nourished,in no acute distress; alert,appropriate and cooperative throughout examination Head:  Normocephalic and atraumatic without obvious abnormalities. No apparent alopecia or balding. Eyes:  No corneal or conjunctival inflammation noted. EOMI. Perrla. Funduscopic exam benign, without hemorrhages, exudates or papilledema. Vision grossly normal. Ears:  External ear exam shows no significant lesions or deformities.  Otoscopic examination reveals clear canals, tympanic membranes are intact bilaterally without bulging, retraction, inflammation or discharge. Hearing is grossly normal bilaterally. Nose:  External nasal examination shows no deformity or inflammation. Nasal mucosa are pink and moist without lesions or exudates. Mouth:  Oral mucosa and oropharynx without lesions or exudates.  Teeth in good repair. Neck:  No deformities, masses, or tenderness noted. Chest Wall:  No deformities, masses, or tenderness noted. Lungs:  Normal respiratory effort, chest expands symmetrically. Lungs are clear to auscultation, no crackles or wheezes. Heart:  Normal  rate and regular rhythm. S1 and S2 normal without gallop, murmur, click, rub or other extra sounds. Abdomen:   Bowel sounds positive,abdomen soft and non-tender without masses, organomegaly or hernias noted. Rectal:  not examined Msk:  No deformity or scoliosis noted of thoracic or lumbar spine.   Extremities:  trace left pedal edema and trace right pedal edema.     Impression & Recommendations:  Problem # 1:  MIGRAINE HEADACHE (ICD-346.90) Assessment Deteriorated  Her updated medication list for this problem includes:    Hydrocodone-acetaminophen 10-650 Mg Tabs (Hydrocodone-acetaminophen) .Marland Kitchen... 1 q4hu as needed for pain or headache    Maxalt 10 Mg Tabs (Rizatriptan benzoate) ..... One onset of severe headache repeat one time if necessary  Problem # 2:  BASAL CELL CARCINOMA, FACE (ICD-173.3) Assessment: New  Orders: Dermatology Referral (Derma)  Problem # 3:  WEIGHT GAIN (ICD-783.1) Assessment: Deteriorated  Problem # 4:  HYPERTENSION (ICD-401.9) Assessment: Deteriorated Benicar 20 mg q.d.  Problem # 5:  ANXIETY DEPRESSION (ICD-300.4) Assessment: Deteriorated Lexapro 10 mg q.d.  Problem # 6:  EDEMA (ICD-782.3) Assessment: Unchanged decrease salt intake  Problem # 7:  GERD (ICD-530.81) Assessment: Deteriorated  The following medications were removed from the medication list:    Protonix 40 Mg Tbec (Pantoprazole sodium) ..... One tablet by mouth once daily Her updated medication list for this problem includes:    Glycopyrrolate 2 Mg Tabs (Glycopyrrolate) .Marland Kitchen... 1 by mouth bid    Nexium 40 Mg Cpdr (Esomeprazole magnesium) ..... One q.d.  Problem # 8:  HYPOTHYROIDISM (ICD-244.9) Assessment: Deteriorated  Orders: TLB-TSH (Thyroid Stimulating Hormone) (84443-TSH)  Complete Medication List: 1)  Alprazolam 2 Mg Tabs (Alprazolam) .... As needed 2)  Hydrocodone-acetaminophen 10-650 Mg Tabs (Hydrocodone-acetaminophen) .Marland Kitchen.. 1 q4hu as needed for pain or headache 3)  Glycopyrrolate 2 Mg Tabs (Glycopyrrolate) .Marland Kitchen.. 1 by mouth bid 4)  Metronidazole 250 Mg Tabs (Metronidazole) .Marland Kitchen.. 1 by  mouth three times a day for 10 days 5)  Gi Cocktail (equal Parts Maalox, Donnatal, Xylocaine)  .Marland Kitchen.. 1 tbsp q 6-8 hrs prn 6)  Nexium 40 Mg Cpdr (Esomeprazole magnesium) .... One q.d. 7)  Lexapro 10 Mg Tabs (Escitalopram oxalate) .... One q.d. 8)  Maxalt 10 Mg Tabs (Rizatriptan benzoate) .... One onset of severe headache repeat one time if necessary  Other Orders: TLB-CBC Platelet - w/Differential (85025-CBCD)  Patient Instructions: 1)  Nexium 1 per day to prevent GI pain 2)  Lexapro 1 each day for anxiety depression and to prevent headaches 3)  Benicar 20 mg each day dor blood pressure 4)  Maxalt 1 onset bad headache 5)  use hydrocodone if neewdsed for pain 6)  will call results of lab studies

## 2010-09-17 NOTE — Consult Note (Signed)
Summary: [facility]  [facility]   Imported By: Maryln Gottron 12/14/2009 14:30:56  _____________________________________________________________________  External Attachment:    Type:   Image     Comment:   External Document

## 2010-09-17 NOTE — Progress Notes (Signed)
Summary: increased pain  Phone Note Call from Patient   Caller: Patient Call For: Judithann Sheen MD Reason for Call: Acute Illness Complaint: Earache/Ear Infection, Headache Action Taken: Provider Notified Summary of Call: Pt is calling for lab work, and wants to know if there is anything else she can take.  Still having a lot of pain in ear, and having sweats.  Headache is severe.  Drops are not helping.  [facility]. [phone]  Initial call taken by: Lynann Beaver CMA,  May 02, 2010 12:41 PM  Follow-up for Phone Call        can have some hydrocodone APAP for pain 5/325 1 tab by mouth three times a day but may cause sedation, disp 60, no RF. If pain is bad enough should present to ER or urgent care for reevaluation this afternoon Follow-up by: Danise Edge MD,  May 02, 2010 12:58 PM  Additional Follow-up for Phone Call Additional follow up Details #1::        Pt given Dr. Mariel Aloe recommendations, and prescription called to [facility]. Additional Follow-up by: Lynann Beaver CMA,  May 02, 2010 3:10 PM    New/Updated Medications: HYDROCODONE-ACETAMINOPHEN 5-325 MG TABS (HYDROCODONE-ACETAMINOPHEN) one by mouth three times a day for pain as needed Prescriptions: HYDROCODONE-ACETAMINOPHEN 5-325 MG TABS (HYDROCODONE-ACETAMINOPHEN) one by mouth three times a day for pain as needed  #60 x 0   Entered by:   Lynann Beaver CMA   Authorized by:   Danise Edge MD   Signed by:   Lynann Beaver CMA on 05/02/2010   Method used:   Telephoned to ...       [facility]  [address] (retail)       [address] [place], [place]  [address]       Ph: [phone]       Fax: [phone]   RxID:   [id number]   Appended Document: increased pain If patient continues in worse pain needs to be seen but if she is not worse can use 2 Hydrocodone at a time over the weekend until she is seen by ENT on 9/20. She cannot take more than 6 tabs a day  Patient  informed- patient states she is going this morning to get CT and she is planning on laying around until her ENT appt.  Clinical Lists Changes  Medications: Removed medication of HYDROCODONE-ACETAMINOPHEN 5-325 MG TABS (HYDROCODONE-ACETAMINOPHEN) one by mouth three times a day for pain as needed - Signed

## 2010-09-17 NOTE — Progress Notes (Signed)
Summary: maxalt to expensive.   Phone Note Call from Patient   Caller: Patient Summary of Call: called to say maxalt would cost her 800.00 dollars and was told imitrex would work Initial call taken by: Pura Spice, RN,  November 22, 2009 3:24 PM  Follow-up for Phone Call        called pt and requested to return call  Follow-up by: Pura Spice, RN,  November 22, 2009 3:24 PM  Additional Follow-up for Phone Call Additional follow up Details #1::        pt called back and would like rx for imitrex called to [facility].  Additional Follow-up by: Pura Spice, RN,  November 22, 2009 4:00 PM    Additional Follow-up for Phone Call Additional follow up Details #2::    imitrex 100 mg per dr Scotty Court  Follow-up by: Pura Spice, RN,  November 22, 2009 5:19 PM  New/Updated Medications: IMITREX 100 MG TABS (SUMATRIPTAN SUCCINATE) take 1 by mouth at hr onset headache then may repeat 1 hr if not relieved. Prescriptions: IMITREX 100 MG TABS (SUMATRIPTAN SUCCINATE) take 1 by mouth at hr onset headache then may repeat 1 hr if not relieved.  #9 x 5   Entered by:   Pura Spice, RN   Authorized by:   Judithann Sheen MD   Signed by:   Pura Spice, RN on 11/22/2009   Method used:   Electronically to        [facility] [address]* (retail)       [address] [place], [place]  [address]       Ph: [phone]       Fax: [phone]   RxID:   [id number]

## 2010-09-17 NOTE — Progress Notes (Signed)
Summary: Diazepam refill  Phone Note Refill Request   Refills Requested: Medication #1:  DIAZEPAM 10 MG TABS by mouth two times a day pt is requesting refill [facility] [phone]  Initial call taken by: Heron Sabins,  May 29, 2010 4:25 PM  Follow-up for Phone Call        In previous note you stated if these worked you would provide more? Please advise? Follow-up by: Josph Macho RMA,  May 29, 2010 4:53 PM  Additional Follow-up for Phone Call Additional follow up Details #1::        she can have it but she is now only allowed no more than 1 alprzolam daily. Keep her Diazepam 10mg  two times a day, give 60, norf Additional Follow-up by: Danise Edge MD,  May 29, 2010 7:43 PM    Prescriptions: DIAZEPAM 10 MG TABS (DIAZEPAM) by mouth two times a day  #60 x 0   Entered by:   Josph Macho RMA   Authorized by:   Danise Edge MD   Signed by:   Josph Macho RMA on 05/30/2010   Method used:   Telephoned to ...       [facility] [phone]* (retail)       [address] [place], [place]  [address]       Ph: [phone]       Fax: [phone]   RxID:   [id number]

## 2010-09-17 NOTE — Progress Notes (Signed)
Summary: wants relief  Phone Note Call from Patient Call back at Home Phone [phone]   Caller: Patient---live call Summary of Call: Has a sinus infection. Nostril is swollen near the eye. Saline is not helping. would like something called in to [facility].  No fever. Initial call taken by: Warnell Forester,  December 11, 2009 3:03 PM  Follow-up for Phone Call        Pt is having eye pain, teeth pain, sinus headache and stuffiness.  Would like an antibiotic called to [facility]. Follow-up by: Lynann Beaver CMA,  December 11, 2009 3:29 PM  Additional Follow-up for Phone Call Additional follow up Details #1::        per dr Scotty Court he called pt on  12-11-09 and med sent to [facility] on 12-11-09   Additional Follow-up by: Pura Spice, RN,  December 12, 2009 9:41 AM

## 2010-09-17 NOTE — Letter (Signed)
Summary: [facility] Instructions  [facility]  [address] [place], [place] [address]   Phone: [phone]  Fax: [phone]       Megan [name]    1951-06-19    MRN: [id number]        Procedure Day Dorna Bloom: Wednesday, 12/26/09     Arrival Time: 2:30      Procedure Time: 3:30     Location of Procedure:                    _ X_  [facility] (4th Floor)                         PREPARATION FOR COLONOSCOPY WITH MOVIPREP      THE DAY BEFORE YOUR PROCEDURE         DATE: 12/25/09    DAY: Tuesday  1.  Drink clear liquids the entire day  Starting NOW.-NO SOLID FOOD  2.  Do not drink anything colored red or purple.  Avoid juices with pulp.  No orange juice.  3.  Drink at least 64 oz. (8 glasses) of fluid/clear liquids during the day to prevent dehydration and help the prep work efficiently.  CLEAR LIQUIDS INCLUDE: Water Jello Ice Popsicles Tea (sugar ok, no milk/cream) Powdered fruit flavored drinks Coffee (sugar ok, no milk/cream) Gatorade Juice: apple, white grape, white cranberry  Lemonade Clear bullion, consomm, broth Carbonated beverages (any kind) Strained chicken noodle soup Hard Candy                             4.  In the morning, mix first dose of MoviPrep solution:    Empty 1 Pouch A and 1 Pouch B into the disposable container    Add lukewarm drinking water to the top line of the container. Mix to dissolve    Refrigerate (mixed solution should be used within 24 hrs)  5.  Begin drinking the prep at 5:00 p.m. The MoviPrep container is divided by 4 marks.   Every 15 minutes drink the solution down to the next mark (approximately 8 oz) until the full liter is complete.   6.  Follow completed prep with 16 oz of clear liquid of your choice (Nothing red or purple).  Continue to drink clear liquids until bedtime.  7.  Before going to bed, mix second dose of MoviPrep solution:    Empty 1 Pouch A and 1 Pouch B into the disposable  container    Add lukewarm drinking water to the top line of the container. Mix to dissolve    Refrigerate  THE DAY OF YOUR PROCEDURE      DATE: 12/26/09   DAY: Wednesday  Beginning at 10:30  a.m. (5 hours before procedure):         1. Every 15 minutes, drink the solution down to the next mark (approx 8 oz) until the full liter is complete.  2. Follow completed prep with 16 oz. of clear liquid of your choice.    3. You may drink clear liquids until 1:30  (2 HOURS BEFORE PROCEDURE).   MEDICATION INSTRUCTIONS  Unless otherwise instructed, you should take regular prescription medications with a small sip of water   as early as possible the morning of your procedure.                  OTHER INSTRUCTIONS  You will need  a responsible adult at least 61 years of age to accompany you and drive you home.   This person must remain in the waiting room during your procedure.  Wear loose fitting clothing that is easily removed.  Leave jewelry and other valuables at home.  However, you may wish to bring a book to read or  an iPod/MP3 player to listen to music as you wait for your procedure to start.  Remove all body piercing jewelry and leave at home.  Total time from sign-in until discharge is approximately 2-3 hours.  You should go home directly after your procedure and rest.  You can resume normal activities the  day after your procedure.  The day of your procedure you should not:   Drive   Make legal decisions   Operate machinery   Drink alcohol   Return to work  You will receive specific instructions about eating, activities and medications before you leave.    The above instructions have been reviewed and explained to me by   _______________________    I fully understand and can verbalize these instructions _____________________________ Date _________

## 2010-09-17 NOTE — Miscellaneous (Signed)
Summary: Orders Update/clotest  Clinical Lists Changes  Orders: Added new Test order of TLB-H Pylori Screen Gastric Biopsy (83013-CLOTEST) - Signed 

## 2010-09-17 NOTE — Procedures (Signed)
Summary: Upper Endoscopy  Patient: Megan [name] Note: All result statuses are Final unless otherwise noted.  Tests: (1) Upper Endoscopy (EGD)   EGD Upper Endoscopy       DONE     [facility]     [address]     [place], [place]  [address]           ENDOSCOPY PROCEDURE REPORT           PATIENT:  Megan [name], Megan [name]  MR#:  [id number]     BIRTHDATE:  October 10, 1950, 58 yrs. old  GENDER:  female           ENDOSCOPIST:  Vania Rea. Jarold Motto, MD, [facility]     Referred by:           PROCEDURE DATE:  12/26/2009     PROCEDURE:  EGD with biopsy     ASA CLASS:  Class II     INDICATIONS:  abdominal pain, diarrhea, GERD           MEDICATIONS:   There was residual sedation effect present from     prior procedure., Versed 2 mg IV     TOPICAL ANESTHETIC:  Exactacain Spray           DESCRIPTION OF PROCEDURE:   After the risks benefits and     alternatives of the procedure were thoroughly explained, informed     consent was obtained.  The [facility] GIF-H180 [id number] endoscope was     introduced through the mouth and advanced to the second portion of     the duodenum, without limitations.  The instrument was slowly     withdrawn as the mucosa was fully examined.     <<PROCEDUREIMAGES>>           Barrett's esophagus was found in the distal esophagus. SEE     PICTURES.2CM SEGMENT OF GRANULAR BARRETT'S MUCOSA BIOPSIED.  A     hiatal hernia was found. PROLAPSING 5 CM HH NOTED.  Normal     duodenal folds were noted. SMALL BOWEL BIOPSY DONE.  The stomach     was entered and closely examined. The antrum, angularis, and     lesser curvature were well visualized, including a retroflexed     view of the cardia and fundus. The stomach wall was normally     distensable. The scope passed easily through the pylorus into the     duodenum. CLO BX. DONE.    Retroflexed views revealed a hiatal     hernia.    The scope was then withdrawn from the patient and the     procedure completed.           COMPLICATIONS:   None           ENDOSCOPIC IMPRESSION:     1) Barrett's esophagus in the distal esophagus     2) Hiatal hernia     3) Normal duodenal folds     4) Normal stomach     5) A hiatal hernia     1.CHRONIC GERD     2.BARRETT'S MUCOSA.R/O CELIAC DISEASE.H.PYLORI,ETC,     RECOMMENDATIONS:     1) Anti-reflux regimen to be follow     2) Await biopsy results     3) Rx CLO if positive     4) continue current medications           REPEAT EXAM:  No           ______________________________  Vania Rea. Jarold Motto, MD, [facility]           CC:  Dianna Limbo, MD           n.     Rosalie Doctor:   Vania Rea. Amaurie Wandel at 12/26/2009 04:30 PM           Georgianne Fick, [id number]  Note: An exclamation mark (!) indicates a result that was not dispersed into the flowsheet. Document Creation Date: 12/26/2009 4:31 PM _______________________________________________________________________  (1) Order result status: Final Collection or observation date-time: 12/26/2009 16:22 Requested date-time:  Receipt date-time:  Reported date-time:  Referring Physician:   Ordering Physician: Sheryn Bison [phone]) Specimen Source:  Source: Launa Grill Order Number: [id number] Lab site:   Appended Document: Upper Endoscopy reviewed

## 2010-09-17 NOTE — Progress Notes (Signed)
Summary: refill on alprazolam  Phone Note From Pharmacy   Caller: [facility]  [address] [phone]* Reason for Call: Needs renewal Details for Reason: alprazolam 2mg  Summary of Call: ok per dr York Spaniel with 5 refills  Initial call taken by: Romualdo Bolk, CMA Duncan Dull),  January 28, 2010 10:51 AM  Follow-up for Phone Call        [facility]  Follow-up by: Pura Spice, RN,  January 29, 2010 7:50 AM    Prescriptions: ALPRAZOLAM 2 MG TABS (ALPRAZOLAM) 1 by mouth three times a day as needed stress  #90 x 5   Entered by:   Pura Spice, RN   Authorized by:   Judithann Sheen MD   Signed by:   Pura Spice, RN on 01/29/2010   Method used:   Printed then faxed to ...       [facility] [phone]* (retail)       [address] [place], [place]  [address]       Ph: [phone]       Fax: [phone]   RxID:   [id number]

## 2010-09-17 NOTE — Procedures (Signed)
Summary: Colonoscopy  Patient: Megan [name] Note: All result statuses are Final unless otherwise noted.  Tests: (1) Colonoscopy (COL)   COL Colonoscopy           DONE     [facility]     [address]     [place], [place]  [address]           COLONOSCOPY PROCEDURE REPORT           PATIENT:  [name], Megan  MR#:  [id number]     BIRTHDATE:  [birth year], 58 yrs. old  GENDER:  female     ENDOSCOPIST:  Vania Rea. Jarold Motto, MD, [facility]     REF. BY:     PROCEDURE DATE:  12/26/2009     PROCEDURE:  Colonoscopy with biopsy     ASA CLASS:  Class II     INDICATIONS:  colorectal cancer screening, average risk, FOBT     positive stool, unexplained diarrhea, abdominal pain     MEDICATIONS:   Fentanyl 125 mcg IV, Versed 12 mg IV, Benadryl 50     mg IV           DESCRIPTION OF PROCEDURE:   After the risks benefits and     alternatives of the procedure were thoroughly explained, informed     consent was obtained.  Digital rectal exam was performed and     revealed no abnormalities.   The [facility] [id number]-[id number] endoscope     was introduced through the anus and advanced to the cecum, which     was identified by both the appendix and ileocecal valve, without     limitations.  The quality of the prep was excellent, using     MoviPrep.  The instrument was then slowly withdrawn as the colon     was fully examined.     <<PROCEDUREIMAGES>>           FINDINGS:  Moderate diverticulosis was found in the sigmoid to     descending colon segments.  External hemorrhoids were found.  No     polyps or cancers were seen.  This was otherwise a normal     examination of the colon. RANDOM BIOPSIES DONE.   Retroflexed     views in the rectum revealed no abnormalities.    The scope was     then withdrawn from the patient and the procedure completed.           COMPLICATIONS:  None     ENDOSCOPIC IMPRESSION:     1) Moderate diverticulosis in the sigmoid to descending colon     segments     2) External  hemorrhoids     3) No polyps or cancers     4) Otherwise normal examination     R/O MICROSCOPIC/COLLAGENOUS COLITIS.     RECOMMENDATIONS:     1) Await biopsy results     2) Upper endoscopy will be scheduled     REPEAT EXAM:  No           ______________________________     Vania Rea. Jarold Motto, MD, [facility]           CC:  Dianna Limbo, MD           n.     Rosalie Doctor:   Vania Rea. Patterson at 12/26/2009 04:13 PM           Georgianne Fick, [id number]  Note: An exclamation mark (!) indicates a result that was not dispersed  into the flowsheet. Document Creation Date: 12/26/2009 4:14 PM _______________________________________________________________________  (1) Order result status: Final Collection or observation date-time: 12/26/2009 16:06 Requested date-time:  Receipt date-time:  Reported date-time:  Referring Physician:   Ordering Physician: Sheryn Bison [phone]) Specimen Source:  Source: Launa Grill Order Number: [id number] Lab site:   Appended Document: Colonoscopy reviewed

## 2010-09-17 NOTE — Progress Notes (Signed)
Summary: med request  Phone Note Call from Patient Call back at Home Phone [phone]   Caller: Patient Call For: Dr. Jarold Motto Reason for Call: Talk to Nurse Summary of Call: would like meds for Barrett's Esophagus... antacids not helping Initial call taken by: Vallarie Mare,  July 08, 2010 3:32 PM  Follow-up for Phone Call        patient taking mylanta and prilosec and nothing is helping she would like to change her PPI. patient complains of "aching" all the way down her esophagus and burning. She  would like something generic or wants to know if there is something eles she can take to relieve the pain.  Follow-up by: Harlow Mares CMA Duncan Dull),  July 08, 2010 3:45 PM  Additional Follow-up for Phone Call Additional follow up Details #1::        OMEPRAZOLE 40MG /DAY AND as needed CARAFATE SUSPENSION/// Additional Follow-up by: Mardella Layman MD FACG,  July 08, 2010 4:29 PM    Additional Follow-up for Phone Call Additional follow up Details #2::    pt aware and she will call back if she needs [place] Follow-up by: Harlow Mares CMA Duncan Dull),  July 08, 2010 4:59 PM  New/Updated Medications: OMEPRAZOLE 40 MG CPDR (OMEPRAZOLE) take one by mouth once daily CARAFATE 1 GM/10ML SUSP (SUCRALFATE) take 5 CC by mouth as needed Prescriptions: CARAFATE 1 GM/10ML SUSP (SUCRALFATE) take 5 CC by mouth as needed  #667ml x 0   Entered by:   Harlow Mares CMA (AAMA)   Authorized by:   Mardella Layman MD [facility]   Signed by:   Harlow Mares CMA (AAMA) on 07/08/2010   Method used:   Electronically to        [facility] [address] (retail)       [address] [place], [place]  [address]       Ph: [phone]       Fax: [phone]   RxID:   [id number] OMEPRAZOLE 40 MG CPDR (OMEPRAZOLE) take one by mouth once daily  #30 x 3   Entered by:   Harlow Mares CMA (AAMA)   Authorized by:   Mardella Layman MD [facility]   Signed by:   Harlow Mares CMA  (AAMA) on 07/08/2010   Method used:   Electronically to        [facility] [address] (retail)       [address] [place], [place]  [address]       Ph: [phone]       Fax: [phone]   RxID:   [id number]

## 2010-09-17 NOTE — Assessment & Plan Note (Signed)
Summary: rectal bleeding//abd bloating--ch.   History of Present Illness Visit Type: Initial Visit Primary GI MD: Sheryn Bison MD FACP FAGA Primary Provider: Dianna Limbo, MD Requesting Provider: n/a Chief Complaint: Increase in acid reflux with hoarsenes. Pt also has some LLQ sharp intermittant abd pains. Pt states she cannot sleep at night with the acid reflux problems.  History of Present Illness:   60 year old Caucasian female patient of Dr. Dianna Limbo Who Has Chronic abdominal gas, bloating, bowel irregularity, mostly diarrhea, and intermittent rectal bleeding. 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 She continues with gas, bloating, diarrhea, intermittent rectal bleeding, and now has upper abdominal discomfort with nausea, reflux symptoms, and painful swallowing. She currently is on GI cocktail, Nexium 40 mg a day, and p.r.n. hydrocodone and alprazolam.  I reviewed her past records in detail. 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 However, she has no history at [facility], weight loss, systemic complaints, or any specific hepatobiliary complaints. Previous abdominal ultrasound exam was normal. She does have B12 deficiency and is on B12 replacement therapy. She also has osteopenia and is on calcium and vitamin D. She denies abuse of NSAIDs, alcohol or cigarettes. Family history is remarkable for colon polyps in her father. She is status post appendectomy but has not had gynecologic surgery.   GI Review of Systems    Reports abdominal pain, acid reflux, heartburn, and  nausea.     Location of  Abdominal pain: LLQ.    Denies belching, bloating, chest pain, dysphagia with liquids, dysphagia with solids, loss of appetite, vomiting, vomiting blood, weight loss, and  weight gain.      Reports rectal bleeding.     Denies anal fissure, black tarry stools, change in bowel habit,  constipation, diarrhea, diverticulosis, fecal incontinence, heme positive stool, hemorrhoids, irritable bowel syndrome, jaundice, light color stool, liver problems, and  rectal pain.    Current Medications (verified): 1)  Alprazolam 2 Mg Tabs (Alprazolam) .Marland Kitchen.. 1 By Mouth Three Times A Day As Needed Stress 2)  Hydrocodone-Acetaminophen 10-650 Mg Tabs (Hydrocodone-Acetaminophen) .Marland Kitchen.. 1 Q4hu As Needed For Pain or Headache No More Than 4 Per Day 3)  Gi Cocktail (Equal Parts Maalox, Donnatal, Xylocaine) .Marland Kitchen.. 1 Tbsp Q 6-8 Hrs Prn 4)  Nexium 40 Mg Cpdr (Esomeprazole Magnesium) .... One Q.d. 5)  Imitrex 100 Mg Tabs (Sumatriptan Succinate) .... Take 1 By Mouth At Hr Onset Headache Then May Repeat 1 Hr If Not Relieved. 6)  Vitamin D 2000 Unit Tabs (Cholecalciferol) .... One Tablet By Mouth Once Daily 7)  Vitamin E 400 Unit Caps (Vitamin E) .... One Capsule By Mouth Once Daily 8)  Calcium Gluconate 650 Mg Tabs (Calcium Gluconate) .... Once Daily 9)  B Complex  Tabs (B Complex Vitamins) .... One Tablet By Mouth Once Daily  Allergies (verified): 1)  ! Sulfamethoxazole (Sulfamethoxazole) 2)  ! * Iv Dye  Past History:  Past medical, surgical, family and social histories (including risk factors) reviewed for relevance to current acute and chronic problems.  Past Medical History: Reviewed history from 05/17/2009 and no changes required. Current Problems:  GERD IBS ANXIETY/DEPRESSION DIVERTICULOSIS INSOMNIA (ICD-780.52) UTI (ICD-599.0) Chronic chest pain  Past Surgical History: Reviewed history from 05/16/2009 and no changes required. 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 Family History of Colon Cancer: Maternal Grandmother, Mother, Cousins x 3, Maternal Aunt  Family History of Breast Cancer: Paternal Grandmother Family History of Ovarian Cancer: Cousin Family History of  Colon Polyps: Father Family History of Diabetes: Mother, grandmother  Social History: Reviewed history from 05/16/2009 and no changes required. Occupation:  Retired Patient is a former smoker. -stopped 6 years ago Alcohol Use - no Illicit Drug Use - no Patient does not get regular exercise.  Daily Caffeine Use: 1 cup daily of coffee  Review of Systems       The patient complains of anxiety-new and cough.  The patient denies allergy/sinus, anemia, arthritis/joint pain, back pain, blood in urine, breast changes/lumps, change in vision, confusion, coughing up blood, depression-new, fainting, fatigue, fever, headaches-new, hearing problems, heart murmur, heart rhythm changes, itching, menstrual pain, muscle pains/cramps, night sweats, nosebleeds, pregnancy symptoms, shortness of breath, skin rash, sleeping problems, sore throat, swelling of feet/legs, swollen lymph glands, thirst - excessive , urination - excessive , urination changes/pain, urine leakage, vision changes, and voice change.   ENT:  Complains of nasal congestion, sore throat, and hoarseness; denies earache, ear discharge, tinnitus, decreased hearing, loss of smell, nosebleeds, and difficulty swallowing. CV:  Complains of chest pains; denies angina, palpitations, syncope, dyspnea on exertion, orthopnea, PND, peripheral edema, and claudication. Resp:  Denies dyspnea at rest, dyspnea with exercise, cough, sputum, wheezing, coughing up blood, and pleurisy. GI:  Complains of difficulty swallowing, pain on swallowing, nausea, indigestion/heartburn, abdominal pain, gas/bloating, diarrhea, and change in bowel habits; denies vomiting, vomiting blood, jaundice, constipation, bloody BM's, black BMs, and fecal incontinence. GU:  Denies urinary burning, blood in urine, nocturnal urination, urinary frequency, urinary incontinence, abnormal vaginal bleeding, amenorrhea, menorrhagia, vaginal discharge, pelvic pain, genital sores, painful intercourse,  and decreased libido. MS:  Complains of joint stiffness; denies joint pain / LOM, joint swelling, joint deformity, low back pain, muscle weakness, muscle cramps, muscle atrophy, leg pain at night, leg pain with exertion, and shoulder pain / LOM hand / wrist pain (CTS). Derm:  Denies rash, itching, dry skin, hives, moles, warts, and unhealing ulcers. Neuro:  Denies weakness, paralysis, abnormal sensation, seizures, syncope, tremors, vertigo, transient blindness, frequent falls, frequent headaches, difficulty walking, headache, sciatica, radiculopathy other:, restless legs, memory loss, and confusion. Psych:  Denies depression, anxiety, memory loss, suicidal ideation, hallucinations, paranoia, phobia, and confusion. Endo:  Complains of cold intolerance; denies heat intolerance, polydipsia, polyphagia, polyuria, unusual weight change, and hirsutism. Heme:  Denies bruising, bleeding, enlarged lymph nodes, and pagophagia. Allergy:  Denies hives, rash, sneezing, hay fever, and recurrent infections.  Vital Signs:  Patient profile:   60 year old female Height:      65 inches Weight:      153 pounds BMI:     25.55 Pulse rate:   80 / minute Pulse rhythm:   regular BP sitting:   150 / 74  (right arm) Cuff size:   regular  Physical Exam  General:  Well developed, well nourished, no acute distress.healthy appearing.   Head:  Normocephalic and atraumatic. Eyes:  PERRLA, no icterus.exam deferred to patient's ophthalmologist.   Mouth:  No deformity or lesions, dentition normal. Neck:  Supple; no masses or thyromegaly. Lungs:  Clear throughout to auscultation. Heart:  Regular rate and rhythm; no murmurs, rubs,  or bruits. Abdomen:  Soft, nontender and nondistended. No masses, hepatosplenomegaly or hernias noted. Normal bowel sounds.Mild distention noted without masses or tenderness. Rectal:  Inspection Unremarkable. No rectal masses or tenderness soft stool present that is guaiac positive. Msk:   Symmetrical with no gross deformities. Normal  posture. Pulses:  Normal pulses noted. Extremities:  No clubbing, cyanosis, edema or deformities noted. Neurologic:  Alert and  oriented x4;  grossly normal neurologically. Skin:  Intact without significant lesions or rashes. Cervical Nodes:  No significant cervical adenopathy. Psych:  Alert and cooperative. Normal mood and affect.   Impression & Recommendations:  Problem # 1:  RECTAL BLEEDING (ICD-569.3) Assessment Unchanged Rule out inflammatory bowel disease versus colonic polyposis. She does appear to have diarrhea predominant IBS and perhaps recurrent bacterial overgrowth syndrome with associated B12 deficiency. There is no history of bladder emptying problems or other neuromuscular diseases. Colonoscopy has been scheduled and we will obtain random biopsies for exam. Also screening lab tests have been ordered for review. Orders: TLB-CBC Platelet - w/Differential (85025-CBCD) TLB-BMP (Basic Metabolic Panel-BMET) (80048-METABOL) TLB-Hepatic/Liver Function Pnl (80076-HEPATIC) TLB-TSH (Thyroid Stimulating Hormone) (84443-TSH) TLB-B12, Serum-Total ONLY (16109-U04) TLB-Ferritin (82728-FER) TLB-Folic Acid (Folate) (82746-FOL) TLB-IBC Pnl (Iron/FE;Transferrin) (83550-IBC) TLB-Amylase (82150-AMYL) TLB-CRP-High Sensitivity (C-Reactive Protein) (86140-FCRP) TLB-Lipase (83690-LIPASE) TLB-Magnesium (Mg) (83735-MG) TLB-Sedimentation Rate (ESR) (85652-ESR) TLB-IgA (Immunoglobulin A) (82784-IGA) T-Sprue Panel (Celiac Disease Aby Eval) (83516x3/[address]) T- * Misc. Laboratory test [phone])  Problem # 2:  GERD (ICD-530.81) Assessment: Deteriorated Increase Nexium to 40 mg twice a day. Endoscopy scheduled ASAP for review. She may need followup upper abdominal ultrasound exam. Strict avoidance of NSAIDs advised. Orders: TLB-CBC Platelet - w/Differential (85025-CBCD) TLB-BMP (Basic Metabolic Panel-BMET) (80048-METABOL) TLB-Hepatic/Liver Function  Pnl (80076-HEPATIC) TLB-TSH (Thyroid Stimulating Hormone) (84443-TSH) TLB-B12, Serum-Total ONLY (11914-N82) TLB-Ferritin (82728-FER) TLB-Folic Acid (Folate) (82746-FOL) TLB-IBC Pnl (Iron/FE;Transferrin) (83550-IBC) TLB-Amylase (82150-AMYL) TLB-CRP-High Sensitivity (C-Reactive Protein) (86140-FCRP) TLB-Lipase (83690-LIPASE) TLB-Magnesium (Mg) (83735-MG) TLB-Sedimentation Rate (ESR) (85652-ESR) TLB-IgA (Immunoglobulin A) (82784-IGA) T-Sprue Panel (Celiac Disease Aby Eval) (83516x3/[address]) T- * Misc. Laboratory test [phone])  Problem # 3:  MIGRAINE HEADACHE (ICD-346.90) Assessment: Unchanged Use of p.r.n. Imitrex suggested with avoidance of NSAIDs.  Problem # 4:  ANEMIA (ICD-285.9) Assessment: Improved Followup labs to exclude significant iron deficiency in a patient with known B12 deficiency.  Patient Instructions: 1)  Please go the basement for lab work. 2)  take Nexium two times a day. 3)  You are scheduled for an endoscopy and colonoscopy tomorrow. 4)  The medication list was reviewed and reconciled.  All changed / newly prescribed medications were explained.  A complete medication list was provided to the patient / caregiver. 5)  Copy sent to : Dr. Dianna Limbo 6)  Please continue current medications.  7)  Colonoscopy and Flexible Sigmoidoscopy brochure given.  8)  Conscious Sedation brochure given.  9)  Upper Endoscopy brochure given.  10)  Avoid foods high in acid content ( tomatoes, citrus juices, spicy foods) . Avoid eating within 3 to 4 hours of lying down or before exercising. Do not over eat; try smaller more frequent meals. Elevate head of bed four inches when sleeping.   Appended Document: rectal bleeding//abd bloating--ch.    Clinical Lists Changes  Medications: Changed medication from NEXIUM 40 MG CPDR (ESOMEPRAZOLE MAGNESIUM) one q.d. to NEXIUM 40 MG CPDR (ESOMEPRAZOLE MAGNESIUM) one bid Added new medication of MOVIPREP 100 GM  SOLR  (PEG-KCL-NACL-NASULF-NA ASC-C) As per prep instructions. Orders: Added new Test order of Colon/Endo (Colon/Endo) - Signed      Appended Document: rectal bleeding//abd bloating--ch. reviewed

## 2010-09-17 NOTE — Assessment & Plan Note (Signed)
Summary: chills/headache/eye pain/cjr   Vital Signs:  Patient profile:   60 year old female Height:      65 inches (165.10 cm) Weight:      151 pounds (68.64 kg) O2 Sat:      98 % on Room air Temp:     98.8 degrees F (37.11 degrees C) oral Pulse rate:   87 / minute BP sitting:   138 / 82  (left arm) Cuff size:   regular  Vitals Entered By: Josph Macho RMA (May 01, 2010 12:13 PM)  O2 Flow:  Room air CC: Chills, headaches, left eye and ear pain, dizziness, blurry vision, weakness X3 weeks/ CF   Current Medications (verified): 1)  Alprazolam 2 Mg Tabs (Alprazolam) .Marland Kitchen.. 1 By Mouth Three Times A Day As Needed Stress 2)  Hydrocodone-Acetaminophen 10-650 Mg Tabs (Hydrocodone-Acetaminophen) .Marland Kitchen.. 1 Q4hu As Needed For Pain or Headache No More Than 4 Per Day 3)  Vitamin D 2000 Unit Tabs (Cholecalciferol) .... One Tablet By Mouth Once Daily 4)  Vitamin E 400 Unit Caps (Vitamin E) .... One Capsule By Mouth Once Daily 5)  Calcium Gluconate 650 Mg Tabs (Calcium Gluconate) .... Once Daily 6)  B Complex  Tabs (B Complex Vitamins) .... One Tablet By Mouth Once Daily 7)  Temazepam 30 Mg Caps (Temazepam) .Marland Kitchen.. 1 By Mouth As Needed Sleep 8)  Amlodipine Besylate 2.5 Mg Tabs (Amlodipine Besylate) .Marland Kitchen.. 1 Tab By Mouth Once Daily 9)  Auralgan Drops .Marland Kitchen.. 2-4 Drops Q 3-4 Hours As Needed Pain in B/l Ears  Allergies (verified): 1)  ! Sulfamethoxazole (Sulfamethoxazole) 2)  ! * Iv Dye  Physical Exam  General:  Well-developed,well-nourished,in no acute distress; alert,appropriate and cooperative throughout examination Head:  Normocephalic and atraumatic without obvious abnormalities. No apparent alopecia or balding. Eyes:  No corneal or conjunctival inflammation noted. EOMI.  Ears:  R external canal clear. R TM w/clear fluid noted behind. L canal clear. TM erythematous, dull with cloudy fluid noted behind and dull Nose:  mucosa boggy and erythematous Mouth:  Oral mucosa and oropharynx without  lesions or exudates.  Teeth in good repair. Neck:  No deformities, masses, or tenderness noted. Lungs:  Normal respiratory effort, chest expands symmetrically. Lungs are clear to auscultation, no crackles or wheezes. Heart:  Normal rate and regular rhythm. S1 and S2 normal without gallop, murmur, click, rub or other extra sounds. Abdomen:  Bowel sounds positive,abdomen soft and non-tender without masses, organomegaly or hernias noted. Extremities:  No clubbing, cyanosis, edema, or deformity noted s.   Neurologic:  No cranial nerve deficits noted. Station and gait are normal. Plantar reflexes are down-going bilaterally. DTRs are symmetrical throughout. Sensory, motor and coordinative functions appear intact. Psych:  Cognition and judgment appear intact. Alert and cooperative with normal attention span and concentration. No apparent delusions, illusions, hallucinations   Impression & Recommendations:  Problem # 1:  SINUSITIS, MAXILLARY, ACUTE (ICD-461.0)  The following medications were removed from the medication list:    Ciprofloxacin Hcl 500 Mg Tabs (Ciprofloxacin hcl) .Marland Kitchen..Marland Kitchen Two times a day    Ciprofloxacin Hcl 500 Mg Tabs (Ciprofloxacin hcl) .Marland Kitchen... 1 tab by mouth two times a day x 10 days Her updated medication list for this problem includes:    Cefdinir 300 Mg Caps (Cefdinir) .Marland Kitchen... 1 cap by mouth two times a day x 21 days  Orders: Radiology Referral (Radiology)/ CT sinuses ENT Referral (ENT) TLB-CBC Platelet - w/Differential (85025-CBCD) TLB-Sedimentation Rate (ESR) (85652-ESR) Specimen Handling (40981) Venipuncture (19147) Partial response to  Cipro now with worsening symptoms  Problem # 2:  HYPERTENSION (ICD-401.9)  Her updated medication list for this problem includes:    Amlodipine Besylate 2.5 Mg Tabs (Amlodipine besylate) .Marland Kitchen... 1 tab by mouth once daily Mild elevation with acute illness, will not change medications todat  Problem # 3:  GERD (ICD-530.81) No complaints today  but with persistent antibiotic use will ask her to start a probiotic daily  Complete Medication List: 1)  Alprazolam 2 Mg Tabs (Alprazolam) .Marland Kitchen.. 1 by mouth three times a day as needed stress 2)  Hydrocodone-acetaminophen 10-650 Mg Tabs (Hydrocodone-acetaminophen) .Marland Kitchen.. 1 q4hu as needed for pain or headache no more than 4 per day 3)  Vitamin D 2000 Unit Tabs (Cholecalciferol) .... One tablet by mouth once daily 4)  Vitamin E 400 Unit Caps (Vitamin e) .... One capsule by mouth once daily 5)  Calcium Gluconate 650 Mg Tabs (Calcium gluconate) .... Once daily 6)  B Complex Tabs (B complex vitamins) .... One tablet by mouth once daily 7)  Temazepam 30 Mg Caps (Temazepam) .Marland Kitchen.. 1 by mouth as needed sleep 8)  Amlodipine Besylate 2.5 Mg Tabs (Amlodipine besylate) .Marland Kitchen.. 1 tab by mouth once daily 9)  Auralgan Drops  .Marland Kitchen.. 2-4 drops q 3-4 hours as needed pain in b/l ears 10)  Cefdinir 300 Mg Caps (Cefdinir) .Marland Kitchen.. 1 cap by mouth two times a day x 21 days 11)  Align 4 Mg Caps (Probiotic product) .Marland Kitchen.. 1 cap by mouth daily  Patient Instructions: 1)  Take 650 - 1000 mg of tylenol every 6-8 hours as needed for relief of pain or comfort of fever. Avoid taking more than 3000 mg in a 24 hour period( can cause liver damage in higher doses).  2)  Take 600 mg of Ibuprofen (Advil, Motrin) with food every6-8 hours as needed  for relief of pain or comfort of fever.  3)  Take your antibiotic as prescribed until ALL of it is gone, but stop if you develop a rash or swelling and contact our office as soon as possible.  4)  Acute sinusitis symptoms for less than 10 days are not helped by antibiotics. Use warm moist compresses, and over the counter decongestants( only as directed). Call if no improvement in 5-7 days, sooner if increasing pain, fever, or new symptoms.  Prescriptions: ALIGN 4 MG CAPS (PROBIOTIC PRODUCT) 1 cap by mouth daily  #7 x 0   Entered and Authorized by:   Danise Edge MD   Signed by:   Danise Edge MD on  05/01/2010   Method used:   Samples Given   RxID:   [id number] CEFDINIR 300 MG CAPS (CEFDINIR) 1 cap by mouth two times a day x 21 days  #42 x 0   Entered and Authorized by:   Danise Edge MD   Signed by:   Danise Edge MD on 05/01/2010   Method used:   Electronically to        [facility] [id number] (retail)       [address] [place], [place]  [address]       Ph: [phone]       Fax: [phone]   RxID:   [id number]   Appended Document: chills/headache/eye pain/cjr     History of Present Illness: patient came today with complaints of recurrent left ear pain. She was seen early in September and treated with ciprofloxacin for otitis. While  on the ciprofloxacin she did improve to a degree. Since being off the antibiotic just for a few days however she is developing worsening left-sided headaches facial pressure or pain behind her eyes pain in her left ear over the last 2 days she's had some episodes of chills, generalized weakness, malaise, myalgias and dysequilibrium. She is denying any significant rhinorrhea, throat pain, chest pain, palpitations, shortness of breath, diarrhea, or vomiting. She has had some mild nausea with her sensitivity dysequilibrium over the last 2 days. No discharge noted from the ears no tinnitus but some mild anorexia and has noted weakness are present. She relays a history today of boxed root canal done roughly 30 years ago. She says she had a root canal done that had to be removed 30 years ago he had used silicon at that time there was some concern that some was left behind but until now had not had further difficulty.  Allergies: 1)  ! Sulfamethoxazole (Sulfamethoxazole) 2)  ! * Iv Dye   Complete Medication List: 1)  Alprazolam 2 Mg Tabs (Alprazolam) .Marland Kitchen.. 1 by mouth three times a day as needed stress 2)  Hydrocodone-acetaminophen 10-650 Mg Tabs (Hydrocodone-acetaminophen) .Marland Kitchen.. 1 q4hu as needed for pain or  headache no more than 4 per day 3)  Vitamin D 2000 Unit Tabs (Cholecalciferol) .... One tablet by mouth once daily 4)  Vitamin E 400 Unit Caps (Vitamin e) .... One capsule by mouth once daily 5)  Calcium Gluconate 650 Mg Tabs (Calcium gluconate) .... Once daily 6)  B Complex Tabs (B complex vitamins) .... One tablet by mouth once daily 7)  Temazepam 30 Mg Caps (Temazepam) .Marland Kitchen.. 1 by mouth as needed sleep 8)  Amlodipine Besylate 2.5 Mg Tabs (Amlodipine besylate) .Marland Kitchen.. 1 tab by mouth once daily 9)  Auralgan Drops  .Marland Kitchen.. 2-4 drops q 3-4 hours as needed pain in b/l ears 10)  Cefdinir 300 Mg Caps (Cefdinir) .Marland Kitchen.. 1 cap by mouth two times a day x 21 days 11)  Align 4 Mg Caps (Probiotic product) .Marland Kitchen.. 1 cap by mouth daily

## 2010-09-17 NOTE — Progress Notes (Signed)
Summary: Triage  Phone Note Call from Patient Call back at Home Phone [phone]   Caller: Patient Call For: Dr. Jarold Motto Reason for Call: Talk to Nurse Summary of Call: Pt has an appt on 12-25-09. She is having  abd. bloating. Wants to know what she can do until her appt. Initial call taken by: Karna Christmas,  Dec 21, 2009 2:22 PM  Follow-up for Phone Call        LM for pt to call.   Ashok Cordia RN  Dec 21, 2009 2:37 PM  Talked to pt.  C/O abd swelling, bloating for a few days.  Thought she may have seen a small amt of blood in stool this am.  Asking if there is anything she can do until her OV next week.  SHe has Protonix and Glycopyrrolarte 2 mg at home.  SHe has not been taking either one recently.  Asks if she should go back on these. Follow-up by: Ashok Cordia RN,  Dec 21, 2009 3:09 PM  Additional Follow-up for Phone Call Additional follow up Details #1::        yes Additional Follow-up by: Mardella Layman MD [facility],  Dec 21, 2009 3:22 PM    Additional Follow-up for Phone Call Additional follow up Details #2::    Pt notified.  Instructed that we can work in with extender on 5.9.11 if needed. Follow-up by: Ashok Cordia RN,  Dec 21, 2009 3:35 PM

## 2010-09-17 NOTE — Progress Notes (Signed)
Summary: Triage  Phone Note Call from Patient Call back at [phone]   Caller: Patient Call For: Dr. Jarold Motto Reason for Call: Talk to Nurse Summary of Call: Pt. has had the stomach bug over the weekend. Is still experiencing Diarrhea and is weak. Wants to know if something could be called in to help soothe the problem Initial call taken by: Karna Christmas,  September 20, 2009 11:36 AM  Follow-up for Phone Call        Pt and her husband both had stomach virus this past weekend.  Nausea, vomiting and diarrhea.  Husband is well now, but pt states she is not bouncing back like she thinks she should.  Continues to have several loose stools daily, Waves of nausea. Feels weak.  Asks if there is anything she needs to be doing considering all her stomach problems.  Follow-up by: Ashok Cordia RN,  September 20, 2009 12:26 PM  Additional Follow-up for Phone Call Additional follow up Details #1::        as needed GI coctail Additional Follow-up by: Mardella Layman MD [facility],  September 20, 2009 12:36 PM    Additional Follow-up for Phone Call Additional follow up Details #2::    Pt notified.  Req Rx be sent to [facility] on [address].  Pt instructed to report back in worsens. Follow-up by: Ashok Cordia RN,  September 20, 2009 12:51 PM  New/Updated Medications: * GI COCKTAIL (EQUAL PARTS MAALOX, DONNATAL, XYLOCAINE) 1 tbsp q 6-8 hrs prn Prescriptions: GI COCKTAIL (EQUAL PARTS MAALOX, DONNATAL, XYLOCAINE) 1 tbsp q 6-8 hrs prn  #1 pt x 1   Entered by:   Ashok Cordia RN   Authorized by:   Mardella Layman MD [facility]   Signed by:   Ashok Cordia RN on 09/20/2009   Method used:   Faxed to ...       [facility]  [address] [id number] (retail)       [address]       [place], [place]  [address]       Ph: [phone]       Fax: [phone]   RxID:   [id number]

## 2010-09-17 NOTE — Progress Notes (Signed)
Summary: rx maxalt  Phone Note Call from Patient   Caller: Patient Call For: Judithann Sheen MD Summary of Call: Pt would like lab results and to let Dr. Scotty Court know she still has a headache and needs meds. [phone] Initial call taken by: Lynann Beaver CMA,  November 21, 2009 12:54 PM  Follow-up for Phone Call        states the maxalt 10 mg helped her wants rx and will pick up tomorrow rx given per dr Scotty Court  Follow-up by: Pura Spice, RN,  November 21, 2009 5:09 PM    Prescriptions: MAXALT 10 MG TABS (RIZATRIPTAN BENZOATE) one onset of severe headache repeat one time if necessary  #3 x 0   Entered by:   Pura Spice, RN   Authorized by:   Judithann Sheen MD   Signed by:   Pura Spice, RN on 11/21/2009   Method used:   Print then Give to Patient   RxID:   [id number]

## 2010-09-17 NOTE — Progress Notes (Signed)
Summary: Nexium samples  Phone Note Call from Patient Call back at Home Phone [phone]   Caller: Patient Call For: Dr. Jarold Motto Reason for Call: Talk to Nurse Summary of Call: Pt is asking for samples of Nexium Initial call taken by: Karna Christmas,  Jan 09, 2010 3:53 PM  Follow-up for Phone Call        Samples given.  Pt notified. Follow-up by: Ashok Cordia RN,  Jan 09, 2010 4:08 PM    New/Updated Medications: NEXIUM 40 MG CPDR (ESOMEPRAZOLE MAGNESIUM) one bid

## 2010-09-17 NOTE — Progress Notes (Signed)
Summary: Hydro refill  Phone Note Refill Request Message from:  Fax from Pharmacy on June 11, 2010 11:22 AM  Refills Requested: Medication #1:  HYDROCODONE-ACETAMINOPHEN 10-650 MG TABS 1 q4hu as needed for pain or headache no more than 4 per day   Dosage confirmed as above?Dosage Confirmed Please advise?  Initial call taken by: Josph Macho RMA,  June 11, 2010 11:22 AM  Follow-up for Phone Call        OK to refill once more with same sig, #60, 1 rf Follow-up by: Danise Edge MD,  June 11, 2010 4:24 PM    Prescriptions: HYDROCODONE-ACETAMINOPHEN 10-650 MG TABS (HYDROCODONE-ACETAMINOPHEN) 1 q4hu as needed for pain or headache no more than 4 per day  #60 x 1   Entered by:   Josph Macho RMA   Authorized by:   Danise Edge MD   Signed by:   Josph Macho RMA on 06/11/2010   Method used:   Telephoned to ...       [facility] [phone]* (retail)       [address] [place], [place]  [address]       Ph: [phone]       Fax: [phone]   RxID:   [id number]

## 2010-09-17 NOTE — Progress Notes (Signed)
Summary: nerve meds.  Phone Note Call from Patient   Caller: Patient Call For: Judithann Sheen MD Summary of Call: Pt wants to switch generic Xanax to a different RX.......Marland Kitchenis not helping, and is gaining weight.  Please call [facility]. [phone] Initial call taken by: Lynann Beaver CMA,  May 17, 2010 11:47 AM  Follow-up for Phone Call        We need to cancel the remaining alprzolam rxs will allowDiazepam 10 mg by mouth two times a day because it is longer acting. If she does not want this or it does not work she needs to come in and discuss options. May call in a 2 week supply to start, 1 two times a day as needed anxiety # 14, if she finds this more helpful we can provide more later Follow-up by: Danise Edge MD,  May 17, 2010 12:03 PM  Additional Follow-up for Phone Call Additional follow up Details #1::        Pt states she wanted the Diazepam called into [facility]. I cancelled the Alprazolam and started the Diazepam. Additional Follow-up by: Josph Macho RMA,  May 17, 2010 12:36 PM    New/Updated Medications: DIAZEPAM 10 MG TABS (DIAZEPAM) by mouth two times a day Prescriptions: DIAZEPAM 10 MG TABS (DIAZEPAM) by mouth two times a day  #14 x 0   Entered by:   Josph Macho RMA   Authorized by:   Danise Edge MD   Signed by:   Josph Macho RMA on 05/17/2010   Method used:   Telephoned to ...       [facility] [phone]* (retail)       [address] [place], [place]  [address]       Ph: [phone]       Fax: [phone]   RxID:   [id number]

## 2010-09-17 NOTE — Progress Notes (Signed)
Summary: meds sent to office for  symbicort & nexium   Phone Note Outgoing Call   Call placed by: gina  Call placed to: Patient Summary of Call: received  in mail 3 boxes symbicort and bottle of nexium  Initial call taken by: Pura Spice, RN,  January 22, 2010 4:06 PM  Follow-up for Phone Call        left mess for pt to call regarding meds here at office.  Follow-up by: Pura Spice, RN,  January 22, 2010 4:07 PM  Additional Follow-up for Phone Call Additional follow up Details #1::        pt returned call meds at front office and stated she will pick up tomorrow  Additional Follow-up by: Pura Spice, RN,  January 22, 2010 4:54 PM

## 2010-09-17 NOTE — Progress Notes (Signed)
Summary: sinus  Phone Note Call from Patient Call back at Home Phone [phone]   Caller: Patient Call For: Megan Sheen MD Summary of Call: C/o sinus "infection", eyes hurt and headache- wants Doxy refilled.  Follow-up for Phone Call        MISSED CALL

## 2010-09-17 NOTE — Progress Notes (Signed)
Summary: RX request  Phone Note Call from Patient Call back at Home Phone [phone]   Caller: Patient Call For: Dr. Abner Greenspan Summary of Call: Pt is ready to start an antidepressant.  Not Cymbalta.  [facility] Initial call taken by: Lynann Beaver CMA AAMA,  June 13, 2010 2:39 PM  Follow-up for Phone Call        Have her come in if she wants to, now, if she wants to start meds first and then come in 3 wks for eval. She can start Citalopram 20mg  by mouth once daily #30 and 1 rf. Call or come in sooner if any concerns Follow-up by: Danise Edge MD,  June 13, 2010 8:38 PM  Additional Follow-up for Phone Call Additional follow up Details #1::        spoke with pt and states will try med first and will make appt to see Dr.Blythe in 3 wks.  Additional Follow-up by: Pura Spice, RN,  June 14, 2010 10:40 AM    New/Updated Medications: CITALOPRAM HYDROBROMIDE 20 MG TABS (CITALOPRAM HYDROBROMIDE) 1 by mouth once daily  needs appt in 3 wks Prescriptions: CITALOPRAM HYDROBROMIDE 20 MG TABS (CITALOPRAM HYDROBROMIDE) 1 by mouth once daily  needs appt in 3 wks  #30 x 1   Entered by:   Pura Spice, RN   Authorized by:   Danise Edge MD   Signed by:   Pura Spice, RN on 06/14/2010   Method used:   Electronically to        [facility] [phone]* (retail)       [address] [place], [place]  [address]       Ph: [phone]       Fax: [phone]   RxID:   [id number]

## 2010-09-17 NOTE — Letter (Signed)
Summary: Patient Notice-Endo Biopsy Results  [facility]  [address] [place], [place] [address]   Phone: [phone]  Fax: [phone]        Dec 31, 2009 MRN: [id number]    Megan [name] [address] [place], [place]  [address]    Dear Ms. SHONE,  I am pleased to inform you that the biopsies taken during your recent endoscopic examination did not show any evidence of cancer upon pathologic examination.  Additional information/recommendations:  __No further action is needed at this time.  Please follow-up with      your primary care physician for your other healthcare needs.  __ Please call [phone] to schedule a return visit to review      your condition.  X_ Continue with the treatment plan as outlined on the day of your      exam.  __ You should have a repeat endoscopic examination for this problem              in _ months/years.   Please call us if you are having persistent problems or have questions about your condition that have not been fully answered at this time.  Sincerely,  Mardella Layman MD [facility]  This letter has been electronically signed by your physician.  Appended Document: Patient Notice-Endo Biopsy Results letter mailed  Appended Document: Patient Notice-Endo Biopsy Results reviewed

## 2010-09-19 NOTE — Assessment & Plan Note (Signed)
Summary: shoulder pain//ccm   Vital Signs:  Patient profile:   60 year old female O2 Sat:      98 % Temp:     98.1 degrees F Pulse rate:   97 / minute BP sitting:   140 / 90  (left arm)  Vitals Entered By: Pura Spice, RN (August 16, 2010 4:26 PM) CC: left shoulder 2 months and states on Dec 23 speech became very slurred severe weakness and then slept for long period of time .  stated did not seek medical attn.    History of Present Illness: Here for 2 months of worsening pain in the left shoulder which now runs down the upper left arm. No neck pain. No recent trauma. She has used Advil and Tramadol and Vicodin, but nothing helps.   Allergies: 1)  ! Sulfamethoxazole (Sulfamethoxazole) 2)  ! * Iv Dye  Past History:  Past Medical History: Reviewed history from 05/17/2009 and no changes required. Current Problems:  GERD IBS ANXIETY/DEPRESSION DIVERTICULOSIS INSOMNIA (ICD-780.52) UTI (ICD-599.0) Chronic chest pain  Past Surgical History: Reviewed history from 05/16/2009 and no changes required. 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    Physical Exam  General:  Well-developed,well-nourished,in no acute distress; alert,appropriate and cooperative throughout examination Msk:  tender in the anterior left shoulder, full ROM. Her pain is worsened by adducting the left arm across the chest. No  crepitus.    Impression & Recommendations:  Problem # 1:  SHOULDER PAIN (ICD-719.41)  Her updated medication list for this problem includes:    Hydrocodone-acetaminophen 10-650 Mg Tabs (Hydrocodone-acetaminophen) .Marland Kitchen... 1 q4hu as needed for pain or headache no more than 4 per day  Orders: Depo- Medrol 40mg  (J1030) Depo- Medrol 80mg  (J1040) Admin of Therapeutic Inj  intramuscular or subcutaneous (47829) Orthopedic Referral (Ortho)  Complete Medication List: 1)  Alprazolam 2 Mg Tabs (Alprazolam) .Marland Kitchen.. 1 by mouth three times a day as needed stress 2)  Hydrocodone-acetaminophen 10-650 Mg Tabs (Hydrocodone-acetaminophen) .Marland Kitchen.. 1 q4hu as needed for pain or headache no more than 4 per day 3)  Vitamin D 2000 Unit Tabs (Cholecalciferol) .... One tablet by mouth once daily 4)  Vitamin E 400 Unit Caps (Vitamin e) .... One capsule by mouth once daily 5)  Calcium Gluconate 650 Mg Tabs (Calcium gluconate) .... Once daily 6)  B Complex Tabs (B complex vitamins) .... One tablet by mouth once daily 7)  Diazepam 10 Mg Tabs (Diazepam) .... By mouth two times a day 8)  Amlodipine Besylate 2.5 Mg Tabs (Amlodipine besylate) .Marland Kitchen.. 1 tab by mouth once daily 9)  Auralgan Drops  .Marland Kitchen.. 2-4 drops q 3-4 hours as needed pain in b/l ears 10)  Cefdinir 300 Mg Caps (Cefdinir) .Marland Kitchen.. 1 cap by mouth two times a day x 21 days 11)  Align 4 Mg Caps (Probiotic product) .Marland Kitchen.. 1 cap by mouth daily 12)  Citalopram Hydrobromide 20 Mg Tabs (Citalopram hydrobromide) .Marland Kitchen.. 1 by mouth once daily  needs appt in 3 wks 13)  Omeprazole 40 Mg Cpdr (Omeprazole) .... Take one by mouth once daily 14)  Carafate 1 Gm/49ml  Susp (Sucralfate) .... Take 5 cc by mouth as needed  Patient Instructions: 1)  this probably represents an impingement syndrome. Given a steroid shot today. Refer to orthopedics   Medication Administration  Injection # 1:    Medication: Depo- Medrol 40mg     Diagnosis: SHOULDER PAIN (ICD-719.41)    Route: IM    Site: RUOQ  gluteus    Exp Date: 02/2013    Lot #: [id number]    Mfr: Pharmacia    Patient tolerated injection without complications    Given by: Pura Spice, RN (August 16, 2010 5:04 PM)  Injection # 2:    Medication: Depo- Medrol 80mg     Diagnosis: SHOULDER PAIN (ICD-719.41)    Route: IM    Site: RUOQ gluteus    Exp Date: 02/2013    Lot #: [id number]    Mfr: Pharmacia    Patient tolerated injection without complications    Given by: Pura Spice, RN (August 16, 2010 5:05 PM)  Orders Added: 1)  Est. Patient Level IV [09811] 2)  Depo- Medrol 40mg  [J1030] 3)  Depo- Medrol 80mg  [J1040] 4)  Admin of Therapeutic Inj  intramuscular or subcutaneous [96372] 5)  Orthopedic Referral [Ortho]

## 2010-09-19 NOTE — Progress Notes (Signed)
  Phone Note Call from Patient   Caller: Patient Call For: Judithann Sheen MD Summary of Call: Explained to pt how to take Estradiol and Provera. Initial call taken by: Lynann Beaver CMA AAMA,  September 12, 2010 10:18 AM  Follow-up for Phone Call        Will call instructions Follow-up by: Judithann Sheen MD,  September 14, 2010 3:45 PM

## 2010-09-19 NOTE — Assessment & Plan Note (Signed)
Summary: FU ON MED/NJR   Vital Signs:  Patient profile:   60 year old female Weight:      152 pounds O2 Sat:      98 % on Room air Temp:     98.1 degrees F oral Pulse rate:   83 / minute Pulse rhythm:   regular BP sitting:   152 / 74  (left arm) Cuff size:   regular  Vitals Entered By: Alfred Levins, CMA (September 10, 2010 3:19 PM)  O2 Flow:  Room air CC: fatigue, SOB sometimes, abnormal weight gain   History of Present Illness: rechecked  BP  62/31 this 60 year old white married female who is very unhappy and has considerable stress in her family especially with her husband complains of anxiety, depression, and fatigue. She relates she is unable to take any kind of anti-depressant medication Also complains of hot flashes and symptoms of postmenopausal syndrome GERD is under good control with Prozac Discuss some leg the regards of postmenopausal syndrome treatment with estrogen and progesterone and the patient would like to try this   Current Medications (verified): 1)  Hydrocodone-Acetaminophen 10-650 Mg Tabs (Hydrocodone-Acetaminophen) .Marland Kitchen.. 1 Q4hu As Needed For Pain or Headache No More Than 4 Per Day 2)  Vitamin D 2000 Unit Tabs (Cholecalciferol) .... One Tablet By Mouth Once Daily 3)  Calcium Gluconate 650 Mg Tabs (Calcium Gluconate) .... Once Daily 4)  Diazepam 10 Mg Tabs (Diazepam) .... By Mouth Two Times A Day 5)  Amlodipine Besylate 2.5 Mg Tabs (Amlodipine Besylate) .Marland Kitchen.. 1 Tab By Mouth Once Daily 6)  Cefdinir 300 Mg Caps (Cefdinir) .Marland Kitchen.. 1 Cap By Mouth Two Times A Day X 21 Days 7)  Align 4 Mg Caps (Probiotic Product) .Marland Kitchen.. 1 Cap By Mouth Daily 8)  Omeprazole 40 Mg Cpdr (Omeprazole) .... Take One By Mouth Once Daily  Allergies (verified): 1)  ! Sulfamethoxazole (Sulfamethoxazole) 2)  ! 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 -stopped 6 years ago Alcohol Use - no Illicit Drug Use - no Patient does not get regular exercise.  Daily Caffeine Use: 1 cup daily of coffee  Risk Factors: Exercise: no (04/28/2008)  Risk Factors: Smoking Status: quit (04/28/2008)  Review of Systems      See HPI  Physical Exam  General:  Well-developed,well-nourished,in no acute distress; alert,appropriate and cooperative throughout examination Neck:  No deformities, masses, or tenderness noted. Chest Wall:  No deformities, masses, or tenderness noted. Breasts:  No mass, nodules, thickening, tenderness, bulging, retraction, inflamation, nipple discharge or skin changes noted.   Lungs:  Normal respiratory effort, chest expands symmetrically. Lungs are clear to auscultation, no crackles or wheezes. Heart:  Normal rate and regular rhythm. S1 and S2 normal without gallop, murmur, click, rub or other extra sounds. Abdomen:  Bowel sounds positive,abdomen soft and non-tender without masses, organomegaly or hernias noted. 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 1 tab qd Provera 5 m g20-30 each cycle  Problem # 2:  MIGRAINE HEADACHE (ICD-346.90) Assessment: Unchanged  Her updated medication  list for this problem includes:    Hydrocodone-acetaminophen 10-650 Mg Tabs (Hydrocodone-acetaminophen) .Marland Kitchen... 1 q4hu as needed for pain or headache no more than 4 per day  Problem # 3:  HYPERTENSION (ICD-401.9) Assessment: Improved  The following medications were removed from the medication list:    Amlodipine Besylate 2.5 Mg Tabs (Amlodipine besylate) .Marland Kitchen... 1 tab by mouth once daily Her updated medication list for this problem includes:    Amlodipine Besylate 5 Mg Tabs (Amlodipine besylate) .Marland Kitchen... 1 once daily for blood pressure  Problem # 4:  ANXIETY DEPRESSION (ICD-300.4) Assessment: Unchanged  Problem # 5:  GERD (ICD-530.81) Assessment: Improved  The following medications were removed from the medication list:    Carafate 1 Gm/46ml Susp (Sucralfate) .Marland Kitchen... Take 5 cc by mouth as needed Her updated medication list for this problem includes:    Omeprazole 40 Mg Cpdr (Omeprazole) .Marland Kitchen... Take one by mouth once daily  Problem # 6:  DIARRHEA, CHRONIC (ICD-787.91) Assessment: Unchanged  Her updated medication list for this problem includes:    Align 4 Mg Caps (Probiotic product) .Marland Kitchen... 1 cap by mouth daily  Complete Medication List: 1)  Hydrocodone-acetaminophen 10-650 Mg Tabs (Hydrocodone-acetaminophen) .Marland Kitchen.. 1 q4hu as needed for pain or headache no more than 4 per day 2)  Vitamin D 2000 Unit Tabs (Cholecalciferol) .... One tablet by mouth once daily 3)  Calcium Gluconate 650 Mg Tabs (Calcium gluconate) .... Once daily 4)  Diazepam 10 Mg Tabs (Diazepam) .... By mouth two times a day 5)  Align 4 Mg Caps (Probiotic product) .Marland Kitchen.. 1 cap by mouth daily 6)  Omeprazole 40 Mg Cpdr (Omeprazole) .... Take one by mouth once daily 7)  Estradiol 1 Mg Tabs (Estradiol) .Marland Kitchen.. 1 tab qd 8)  Provera 2.5 Mg Tabs (Medroxyprogesterone acetate) .Marland Kitchen.. 1 ta b 20-79mday of tablets 9)  Lorazepam 2 Mg Tabs (Lorazepam) .Marland Kitchen.. 1 hs for sleep 10)  Amlodipine Besylate 5 Mg Tabs (Amlodipine besylate) .Marland Kitchen.. 1 once daily for  blood pressure  Patient Instructions: 1)  2 treatment patient was estradiol plus Provera and she is to return in 3-4 months for a pelvic and discussion regarding the effect of the medication 2)  Refill all of her other medications Prescriptions: AMLODIPINE BESYLATE 5 MG TABS (AMLODIPINE BESYLATE) 1 once daily for blood pressure  #30 x 11   Entered and Authorized by:   Judithann Sheen MD   Signed by:   Judithann Sheen MD on 09/10/2010   Method used:   Electronically to        [id number]* (retail)       [address] [place], [place]  [address]       Ph: [phone]       Fax: [phone]   RxID:   [id number] LORAZEPAM 2 MG TABS (LORAZEPAM) 1 hs for sleep  #30 x 5   Entered and Authorized by:   Judithann Sheen MD   Signed by:   Judithann Sheen MD on 09/10/2010   Method used:   Print then Give to Patient   RxID:   [id number] PROVERA 2.5 MG TABS (MEDROXYPROGESTERONE  ACETATE) 1 ta b 20-57mday of tablets  #10 x 11   Entered and Authorized by:   Judithann Sheen MD   Signed by:   Judithann Sheen MD on 09/10/2010   Method used:   Electronically to        [id number]* (retail)       [address] [place], [place]  [address]       Ph: [phone]       Fax: [phone]   RxID:   [id number] ESTRADIOL 1 MG TABS (ESTRADIOL) 1 tab qd  #20 x 5   Entered and Authorized by:   Judithann Sheen MD   Signed by:   Judithann Sheen MD on 09/10/2010   Method used:   Electronically to        [id number]* (retail)       [address] [place], [place]  [address]       Ph: [phone]       Fax: [phone]   RxID:   [id number]    Orders Added: 1)  Est. Patient Level IV [51761]

## 2010-09-20 ENCOUNTER — Ambulatory Visit: Payer: Self-pay | Admitting: Gastroenterology

## 2010-09-24 ENCOUNTER — Ambulatory Visit (INDEPENDENT_AMBULATORY_CARE_PROVIDER_SITE_OTHER): Payer: Self-pay | Admitting: Family Medicine

## 2010-09-24 ENCOUNTER — Encounter: Payer: Self-pay | Admitting: Family Medicine

## 2010-09-24 VITALS — BP 132/88 | HR 79 | Temp 98.4°F | Wt 155.0 lb

## 2010-09-24 DIAGNOSIS — IMO0001 Reserved for inherently not codable concepts without codable children: Secondary | ICD-10-CM

## 2010-09-24 DIAGNOSIS — N6019 Diffuse cystic mastopathy of unspecified breast: Secondary | ICD-10-CM

## 2010-09-24 DIAGNOSIS — R609 Edema, unspecified: Secondary | ICD-10-CM

## 2010-09-24 DIAGNOSIS — M609 Myositis, unspecified: Secondary | ICD-10-CM

## 2010-09-24 LAB — CK: Total CK: 39 U/L (ref 7–177)

## 2010-09-24 LAB — SEDIMENTATION RATE: Sed Rate: 17 mm/hr (ref 0–22)

## 2010-09-25 DIAGNOSIS — M609 Myositis, unspecified: Secondary | ICD-10-CM | POA: Insufficient documentation

## 2010-09-25 NOTE — Patient Instructions (Signed)
After getting lab for studies I will call you and discuss diagnosis and treatment

## 2010-09-25 NOTE — Progress Notes (Signed)
Summary: Triage  Phone Note Call from Patient Call back at Home Phone [phone]   Caller: Patient Call For: Dr. Jarold Motto Reason for Call: Talk to Nurse Summary of Call: Diverticulitis flareup....bloating and uncontrollable diarrhea Initial call taken by: Karna Christmas,  September 16, 2010 1:34 PM  Follow-up for Phone Call        Patient reports her diverticulitis flare started last pm. Patient doesn't know if it's food related or due to a change in meds. She reports constant diarrhea or oozing d/t pressure and bloating in her "belly pushing the stool out". Denies fever or bloody stools although she does report mucus in  her stools. Graciella Freer RN  September 16, 2010 2:13 PM   Additional Follow-up for Phone Call Additional follow up Details #1::        NEEDS OV Additional Follow-up by: Mardella Layman MD FACG,  September 16, 2010 2:47 PM    Additional Follow-up for Phone Call Additional follow up Details #2::    Notified patient that she needs an OV per Dr Jarold Motto. Patient scheduled for 09/20/10@ 1130am. Follow-up by: Graciella Freer RN,  September 16, 2010 4:48 PM

## 2010-09-25 NOTE — Progress Notes (Signed)
  Subjective:    Patient ID: Megan [name], female    DOB: Nov 23, 1950, 60 y.o.   MRN: [id number] This 60-year-old white who had the onset of peripheral edema over the past 2 days was also considerable tenderness and pain of the pounds with no pain in the calves she has no no other acute complaintHPIShe was seen 2-1/2 weeks ago at this time had postmenopausal syndrome and was started on estrogen and progesterone at that time she is uncertain when she has any benefit at this time   Review of Systemssee H&P denies any shortness of breath or cough denies chest pain no increased episodes of GERD no constipation no diarrhea no urinary symptoms    Objective:   Physical Exampositive physical findings reveal tenderness over the thighs and muscle and possible increased vascularity but no erythema. Only has trace edema pretibial bilateral at this time but patient but has been or severe        Assessment & Plan:  Plan do obtain laboratory studies CK and sedimentation rate are also noted to decrease estradiol 2.5 mg. 2 utilize diuretic for edema when needed

## 2010-09-27 ENCOUNTER — Telehealth: Payer: Self-pay | Admitting: Internal Medicine

## 2010-09-27 ENCOUNTER — Telehealth: Payer: Self-pay | Admitting: *Deleted

## 2010-09-27 DIAGNOSIS — M25519 Pain in unspecified shoulder: Secondary | ICD-10-CM

## 2010-09-27 MED ORDER — HYDROCODONE-ACETAMINOPHEN 5-325 MG PO TABS: 1.0000 | ORAL_TABLET | Freq: Four times a day (QID) | ORAL | Status: AC | PRN

## 2010-09-27 NOTE — Telephone Encounter (Signed)
Esr and ck labwork nl. Has had hydrocodone in the past from pmd. Can call in hydrocodone 5mg  one po q6 hours prn pain #20 until pmd can review further

## 2010-09-27 NOTE — Telephone Encounter (Signed)
Pt wants RX for pain stronger than Tylenol, and results of labs.

## 2010-10-09 ENCOUNTER — Telehealth: Payer: Self-pay | Admitting: *Deleted

## 2010-10-09 NOTE — Telephone Encounter (Signed)
Needs a letter from Dr. Scotty Court re: her depression and health issues for disability.

## 2010-10-10 NOTE — Telephone Encounter (Signed)
Chart was opened in error. 

## 2010-10-15 ENCOUNTER — Telehealth: Payer: Self-pay | Admitting: Family Medicine

## 2010-10-15 NOTE — Telephone Encounter (Signed)
Pt would like a return call from Dr Scotty Court to her at [phone] to discuss disability papers.

## 2010-10-23 ENCOUNTER — Encounter: Payer: Self-pay | Admitting: Family Medicine

## 2010-11-01 ENCOUNTER — Telehealth: Payer: Self-pay | Admitting: Gastroenterology

## 2010-11-03 LAB — DIFFERENTIAL
Eosinophils Relative: 2 % (ref 0–5)
Lymphocytes Relative: 33 % (ref 12–46)
Lymphs Abs: 2 10*3/uL (ref 0.7–4.0)
Monocytes Relative: 9 % (ref 3–12)

## 2010-11-03 LAB — LIPASE, BLOOD: Lipase: 33 U/L (ref 11–59)

## 2010-11-03 LAB — POCT CARDIAC MARKERS
CKMB, poc: <1 ng/mL — ABNORMAL LOW (ref 1.0–8.0)
Myoglobin, poc: 30.6 ng/mL (ref 12–200)
Troponin i, poc: <0.05 ng/mL (ref 0.00–0.09)
Troponin i, poc: <0.05 ng/mL (ref 0.00–0.09)

## 2010-11-03 LAB — D-DIMER, QUANTITATIVE: D-Dimer, Quant: 0.45 ug/mL-FEU (ref 0.00–0.48)

## 2010-11-03 LAB — COMPREHENSIVE METABOLIC PANEL
AST: 20 U/L (ref 0–37)
CO2: 26 mEq/L (ref 19–32)
Calcium: 9.2 mg/dL (ref 8.4–10.5)
Creatinine, Ser: 0.62 mg/dL (ref 0.4–1.2)
GFR calc Af Amer: >60 mL/min (ref >60)
GFR calc non Af Amer: >60 mL/min (ref >60)
Total Protein: 7.3 g/dL (ref 6.0–8.3)

## 2010-11-03 LAB — URINE CULTURE

## 2010-11-03 LAB — URINALYSIS, ROUTINE W REFLEX MICROSCOPIC
Hgb urine dipstick: NEGATIVE
Nitrite: NEGATIVE
Specific Gravity, Urine: 1.007 (ref 1.005–1.030)
Urobilinogen, UA: 0.2 mg/dL (ref 0.0–1.0)

## 2010-11-03 LAB — CBC
Hemoglobin: 13.8 g/dL (ref 12.0–15.0)
MCH: 31.9 pg (ref 26.0–34.0)
MCHC: 34.7 g/dL (ref 30.0–36.0)
Platelets: 249 10*3/uL (ref 150–400)
RDW: 12.3 % (ref 11.5–15.5)

## 2010-11-05 ENCOUNTER — Telehealth: Payer: Self-pay | Admitting: Gastroenterology

## 2010-11-05 NOTE — Telephone Encounter (Signed)
Phone Note  Call from Patient Call back at [facility] Phone [phone]   Call For: Dr Jarold Motto Reason for Call: Talk to Nurse Summary of Call: Having a major flare up. Is swollen and has diarrhea. Has been using the GI tonic & protonix with no relief. Last time she had these symptoms she was given a pill that cleared it up right away. Initial call taken by: Leanor Kail [facility],  November 01, 2010 10:49 AM  Follow-up for Phone Call         patient states that she is taking carafate and omeprazole without relief for her diarrhea and i have explain to her that these drugs are not for diarrhea they are for her upper abdominal issues. She states that she has extra flagyl and xifaxan that she has never taken. I advised her that she should complete a treatment a treatment when she is rxed it and that she is not advised to take previous medicaion she can take Imoddium OTC per box directions and take her Align Daily. She denies any abdominal pain or blood in her stooll just occ diarrhea. I have given her the first avb slot for Dr. Jarold Motto.  Follow-up by: Harlow Mares CMA [facility]),  November 01, 2010 11:30 AM   Pt's husband called to report pt's abdominal pain is getting worse,diarrhea has improved and she can't wait any longer. He also stated she is c/o a dry mouth and throat. Pt is taking Activia and Prilosec with no help. Pt given an appointment tomorrow at 3:30pm with Mike Gip, PA. I kept the appt with Dr Jarold Motto in the system.     ________________________________________________________________________

## 2010-11-05 NOTE — Progress Notes (Signed)
Summary: Major flare up  Phone Note Call from Patient Call back at [facility] Phone [phone]   Call For: Dr Jarold Motto Reason for Call: Talk to Nurse Summary of Call: Having a major flare up. Is swollen and has diarrhea. Has been using the GI tonic & protonix with no relief. Last time she had these symptoms she was given a pill that cleared it up right away. Initial call taken by: Leanor Kail [facility],  November 01, 2010 10:49 AM  Follow-up for Phone Call        patient states that she is taking carafate and omeprazole without relief for her diarrhea and i have explain to her that these drugs are not for diarrhea they are for her upper abdominal issues. She states that she has extra flagyl and xifaxan that she has never taken. I advised her that she should complete a treatment a treatment when she is rxed it and that she is not advised to take previous medicaion she can take Imoddium OTC per box directions and take her Align Daily. She denies any abdominal pain or blood in her stooll just occ diarrhea. I have given her the first avb slot for Dr. Jarold Motto.  Follow-up by: Harlow Mares CMA (AAMA),  November 01, 2010 11:30 AM

## 2010-11-06 ENCOUNTER — Other Ambulatory Visit (INDEPENDENT_AMBULATORY_CARE_PROVIDER_SITE_OTHER): Payer: Self-pay

## 2010-11-06 ENCOUNTER — Ambulatory Visit (INDEPENDENT_AMBULATORY_CARE_PROVIDER_SITE_OTHER): Payer: Self-pay | Admitting: Physician Assistant

## 2010-11-06 ENCOUNTER — Encounter: Payer: Self-pay | Admitting: Physician Assistant

## 2010-11-06 DIAGNOSIS — K219 Gastro-esophageal reflux disease without esophagitis: Secondary | ICD-10-CM

## 2010-11-06 DIAGNOSIS — B3781 Candidal esophagitis: Secondary | ICD-10-CM

## 2010-11-06 DIAGNOSIS — K589 Irritable bowel syndrome without diarrhea: Secondary | ICD-10-CM

## 2010-11-06 LAB — HIGH SENSITIVITY CRP: CRP, High Sensitivity: 0.9 mg/L (ref 0.00–5.00)

## 2010-11-06 LAB — CBC WITH DIFFERENTIAL/PLATELET
Basophils Absolute: 0 10*3/uL (ref 0.0–0.1)
Eosinophils Absolute: 0.2 10*3/uL (ref 0.0–0.7)
Lymphocytes Relative: 31.3 % (ref 12.0–46.0)
Monocytes Relative: 8.4 % (ref 3.0–12.0)
Platelets: 297 10*3/uL (ref 150.0–400.0)
RDW: 12.8 % (ref 11.5–14.6)

## 2010-11-06 MED ORDER — ESOMEPRAZOLE MAGNESIUM 40 MG PO CPDR: 40.0000 mg | DELAYED_RELEASE_CAPSULE | Freq: Every day | ORAL | Status: DC

## 2010-11-06 MED ORDER — GLYCOPYRROLATE 2 MG PO TABS: 2.0000 mg | ORAL_TABLET | Freq: Three times a day (TID) | ORAL | Status: AC

## 2010-11-06 MED ORDER — AMBULATORY NON FORMULARY MEDICATION: 5.0000 mL | Freq: Four times a day (QID) | Status: AC

## 2010-11-06 NOTE — Patient Instructions (Signed)
Please go to the basement level to have your labs drawn.  We have given you samples of Nexium 40 MG. Take twice daily x 14 days then once daily.  Stop taking the Prilosec. We have sent prescriptions for Robinul Forte and Mycostatin mouth wash to your pharmacy. Call us @ [phone]  On  Monday with a progress report.

## 2010-11-06 NOTE — Progress Notes (Signed)
Reviewed and agree with management. Elie Goody., MD

## 2010-11-06 NOTE — Progress Notes (Signed)
Reviewed and agree with management

## 2010-11-06 NOTE — Progress Notes (Signed)
Subjective:    Patient ID: Megan [name], female    DOB: 01-17-1951, 60 y.o.   MRN: [id number]  HPI This is a pleasant 60 year old white female known to Dr. Jarold Motto who underwent workup in May of 2011 for complaints of abdominal bloating and distention diarrhea and acid reflux. She had colonoscopy which showed diverticulosis and no polyps. She had random biopsies done which were negative. Upper endoscopy showed a small hiatal hernia biopsies were negative for Barrett's. Small bowel biopsies were also done and showed no evidence of villous atrophy or other abnormality. 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 She received a course of Cipro which seemed to help. She had also been maintained on Nexium which is were working well for her reflux but was changed to Prilosec several months ago for insurance reasons.   She comes in today with complaints of recurrence of diarrhea over the past couple of weeks, associated with abdominal cramping bloating and gas. Actually at this point the diarrhea has resolved and she is having normal bowel movements but continues to complain of bloating. She is also having abdominal cramping in her upper abdomen after eating. She complains of nausea without vomiting and ongoing problems with reflux. Her primary complaint today is a hoarseness and dry feeling throughout her esophagus and in her mouth. She says that her mouth this so dry at night she has to get up and drink water but she feels achy  throughout her esophagus.She is not really having any  odynophagia ,but does feel that  food is sticking a bit with swallowing. She is sleeping with the head of her bed elevated. She has not had any recent antibiotics over the past few months.    Review of Systems  Constitutional: Positive for appetite change. Negative for fever, chills, diaphoresis, fatigue and unexpected weight change.  HENT: Negative.   Eyes: Negative.   Respiratory: Negative.     Cardiovascular: Negative.   Gastrointestinal: Positive for nausea, abdominal pain, diarrhea and abdominal distention. Negative for vomiting, constipation and blood in stool.  Genitourinary: Negative.   Musculoskeletal: Negative.   Neurological: Negative.   Hematological: Negative.   Psychiatric/Behavioral: Negative.        Objective:   Physical Exam Well-developed white female in no acute distress alert and oriented x3 HEENT nontraumatic normocephalic EOMI PERRLA sclera anicteric tongue is coated with yellowish exudate,buccal mucosa normal,, N eck supple no JVD,Cardiovascular regular rate and rhythm with S1-S2 no murmur rub or gallop, Pulmonary clear, Abdomen;soft nontender ,nondistended ,bowel sounds active no focal tenderness, no mass or hepatosplenomegaly Rectal exam not don, Skin benign, Psych; affect and mood normal and appropriate        Assessment & Plan:   #91 60 year old female with chronic GERD with exacerbation , probable esophagitis Also concerned with oropharyngeal candidiasis and possible candida esophagitis given exam and her significant complaint of dryness.   Plan; trial of Nexium 40 mg by mouth twice daily samples given today. It improved with decrease Nexium to once daily thereafter in place of her Prilosec.   Add Mycostatin oral suspension 5 cc 4 times daily swish and swallow x2 weeks , if she does not notice any improvement in symptoms in 5 days she is asked to call and may give her a course of Diflucan at that point. Continue strict anti-reflux regimen and elevation of the head of the bed.   #2 IBS/ bacterial overgrowth syndrome   Plan; currently she is not having any diarrhea continues with  bloating and intermittent cramping.  Start Robinul Forte 2 mg every morning and may take second dose   Check CBC and CRP today   RV with Dr. Jarold Motto as needed    #3 Diverticulosis   Clinically no evidence for diverticulitis at this time.

## 2010-11-08 ENCOUNTER — Telehealth: Payer: Self-pay | Admitting: *Deleted

## 2010-11-08 NOTE — Telephone Encounter (Signed)
Message copied by Jesse Fall on Fri Nov 08, 2010  8:47 AM ------      Message from: [place], [place]      Created: Thu Nov 07, 2010  5:05 PM       PLEASE LET HER KNOW HER LABS ARE NORMAL

## 2010-11-08 NOTE — Telephone Encounter (Signed)
Patient notified of lab results.Jesse Fall, RN Level II

## 2010-11-11 ENCOUNTER — Ambulatory Visit (INDEPENDENT_AMBULATORY_CARE_PROVIDER_SITE_OTHER): Payer: Self-pay | Admitting: Family Medicine

## 2010-11-11 ENCOUNTER — Encounter: Payer: Self-pay | Admitting: Family Medicine

## 2010-11-11 VITALS — BP 144/80 | Temp 98.4°F

## 2010-11-11 DIAGNOSIS — R7309 Other abnormal glucose: Secondary | ICD-10-CM

## 2010-11-11 DIAGNOSIS — R638 Other symptoms and signs concerning food and fluid intake: Secondary | ICD-10-CM

## 2010-11-11 DIAGNOSIS — IMO0001 Reserved for inherently not codable concepts without codable children: Secondary | ICD-10-CM

## 2010-11-11 DIAGNOSIS — R197 Diarrhea, unspecified: Secondary | ICD-10-CM

## 2010-11-11 DIAGNOSIS — R5383 Other fatigue: Secondary | ICD-10-CM

## 2010-11-11 LAB — TSH: TSH: 1 u[IU]/mL (ref 0.35–5.50)

## 2010-11-11 LAB — BASIC METABOLIC PANEL
BUN: 8 mg/dL (ref 6–23)
Calcium: 9.3 mg/dL (ref 8.4–10.5)
GFR: 83.69 mL/min (ref >60.00)
Potassium: 4 mEq/L (ref 3.5–5.1)
Sodium: 140 mEq/L (ref 135–145)

## 2010-11-11 NOTE — Progress Notes (Signed)
  Subjective:    Patient ID: Megan [name], female    DOB: [birth year], 60 y.o.   MRN: [id number]  HPI  Patient seen as a work in with multiple complaints. She's had some ongoing, somewhat intermittent abdominal bloating for quite some time.  Presented last May with bloating and had upper endoscopy and colonoscopy which showed diverticulosis changes. She was seen recently and placed on Mycostatin oral suspension for possible thrush. She complains of increased thirst and dry mouth. Some urine frequency. Strong family history of type 2 diabetes in both parents. No weight loss. Relates some recent weight gain. Recent CBC and  C-reactive protein levels normal.  Intermittent diarrhea for the past couple weeks. Increased fatigue. No recent oral antibiotics. Hypertension treated with low-dose Toprol-XL 25 mg daily. Blood pressures were quite labile by home readings. Patient recently initiated on Nexium 40 mg daily. She relates about 2 weeks ago also some reflux-type symptoms. Still has occasional sensation of food getting hung up in her esophagus. The symptoms are inconsistent. Denies recent fever or chills. No recent stool changes other than continued nonbloody diarrhea up to 6 stools per day occasionally.   Review of Systems  Constitutional: Positive for fatigue and unexpected weight change. Negative for fever, chills and appetite change.  HENT: Negative for neck pain.   Respiratory: Negative for cough, shortness of breath and wheezing.   Cardiovascular: Negative for chest pain, palpitations and leg swelling.  Gastrointestinal: Positive for abdominal pain and abdominal distention. Negative for blood in stool.  Genitourinary: Negative for dysuria.       Objective:   Physical Exam  Constitutional: She is oriented to person, place, and time. She appears well-developed and well-nourished.  HENT:  Head: Normocephalic and atraumatic.  Right Ear: External ear normal.  Eyes: Conjunctivae are normal.  Pupils are equal, round, and reactive to light.  Neck: Normal range of motion. Neck supple. No thyromegaly present.  Cardiovascular: Normal rate, regular rhythm and normal heart sounds.   No murmur heard. Pulmonary/Chest: Effort normal and breath sounds normal. No respiratory distress. She has no rales. She exhibits no tenderness.  Abdominal: Soft. Bowel sounds are normal. She exhibits no distension and no mass. There is no tenderness. There is no guarding.  Musculoskeletal: She exhibits no edema.  Lymphadenopathy:    She has no cervical adenopathy.  Neurological: She is alert and oriented to person, place, and time.  Skin: No rash noted.          Assessment & Plan:   #1 fatigue. Questionable etiology. Recent diarrhea-?related. Rule out electrolyte abnormality. Check basic metabolic panel and TSH  #2 increased thirst. One-hour postprandial blood sugar. 110. Also complains of dry eyes.   Question  autoimmune Sjogren's  #3 hypertension  #4 chronic intermittent diarrhea  #5 GERD

## 2010-11-12 NOTE — Progress Notes (Signed)
Quick Note:  Pt informed ______ 

## 2010-11-15 ENCOUNTER — Telehealth: Payer: Self-pay | Admitting: Gastroenterology

## 2010-11-18 NOTE — Telephone Encounter (Signed)
Pt reports problems with swallowing; had been on Mycostatin. Pt saw Dr Caryl Never on 11/28/10. Called pt this am and she stated she will just wait until her 11/28/10 with Dr Jarold Motto and discuss her problems then.

## 2010-11-19 ENCOUNTER — Telehealth: Payer: Self-pay | Admitting: Gastroenterology

## 2010-11-19 ENCOUNTER — Telehealth: Payer: Self-pay | Admitting: *Deleted

## 2010-11-19 LAB — COMPREHENSIVE METABOLIC PANEL
ALT: 23 U/L (ref 0–35)
AST: 24 U/L (ref 0–37)
Albumin: 4 g/dL (ref 3.5–5.2)
CO2: 28 mEq/L (ref 19–32)
Calcium: 9.2 mg/dL (ref 8.4–10.5)
Creatinine, Ser: 0.62 mg/dL (ref 0.4–1.2)
GFR calc Af Amer: >60 mL/min (ref >60)
Sodium: 142 mEq/L (ref 135–145)
Total Protein: 7.3 g/dL (ref 6.0–8.3)

## 2010-11-19 LAB — CBC
MCHC: 33.9 g/dL (ref 30.0–36.0)
MCV: 92.4 fL (ref 78.0–100.0)
Platelets: 247 10*3/uL (ref 150–400)
RBC: 4.58 MIL/uL (ref 3.87–5.11)
RDW: 12.3 % (ref 11.5–15.5)

## 2010-11-19 LAB — URINALYSIS, ROUTINE W REFLEX MICROSCOPIC
Bilirubin Urine: NEGATIVE
Nitrite: NEGATIVE
Protein, ur: NEGATIVE mg/dL
Specific Gravity, Urine: 1.009 (ref 1.005–1.030)
Urobilinogen, UA: 0.2 mg/dL (ref 0.0–1.0)

## 2010-11-19 LAB — LIPASE, BLOOD: Lipase: 17 U/L (ref 11–59)

## 2010-11-19 LAB — DIFFERENTIAL
Eosinophils Absolute: 0.2 10*3/uL (ref 0.0–0.7)
Eosinophils Relative: 3 % (ref 0–5)
Lymphocytes Relative: 38 % (ref 12–46)
Lymphs Abs: 1.8 10*3/uL (ref 0.7–4.0)
Monocytes Absolute: 0.4 10*3/uL (ref 0.1–1.0)
Monocytes Relative: 9 % (ref 3–12)

## 2010-11-19 MED ORDER — ESOMEPRAZOLE MAGNESIUM 40 MG PO CPDR: 40.0000 mg | DELAYED_RELEASE_CAPSULE | Freq: Every day | ORAL | Status: DC

## 2010-11-19 NOTE — Telephone Encounter (Signed)
Pt is asking for an antibiotic and prednisone for a sinus infection.  Teeth, forehead, and headache with nasal congestion.  Please call to [facility].

## 2010-11-19 NOTE — Telephone Encounter (Signed)
Advised patient that she was given samples to take Nexium bid only for 14 days but she will need an rx for the once a day, I have printed it and left her a discount card out front.

## 2010-11-20 MED ORDER — DOXYCYCLINE HYCLATE 100 MG PO TABS: 100.0000 mg | ORAL_TABLET | Freq: Two times a day (BID) | ORAL | Status: AC

## 2010-11-20 NOTE — Telephone Encounter (Signed)
Per Dr. Scotty Court- Doxy 100mg  #20 bid and mucinex d max strength. No prednisone. Pt aware and rx sent to pharmacy.

## 2010-11-21 LAB — LIPID PANEL
Cholesterol: 196 mg/dL (ref 0–200)
HDL: 72 mg/dL — AB (ref 35–70)
LDL Cholesterol: 112 mg/dL
Triglycerides: 58 mg/dL (ref 40–160)

## 2010-11-21 LAB — BASIC METABOLIC PANEL: Sodium: 137 mmol/L (ref 137–147)

## 2010-11-21 LAB — TSH: TSH: 1.74 u[IU]/mL (ref 0.41–5.90)

## 2010-11-23 LAB — TSH: TSH: 1.131 u[IU]/mL (ref 0.350–4.500)

## 2010-11-25 ENCOUNTER — Telehealth: Payer: Self-pay | Admitting: Gastroenterology

## 2010-11-25 MED ORDER — ESOMEPRAZOLE MAGNESIUM 40 MG PO CPDR: 10.0000 mg | DELAYED_RELEASE_CAPSULE | Freq: Every day | ORAL | Status: DC

## 2010-11-25 NOTE — Telephone Encounter (Signed)
Notified pt I left her Nexium at the front desk.

## 2010-11-28 ENCOUNTER — Encounter: Payer: Self-pay | Admitting: Gastroenterology

## 2010-11-28 ENCOUNTER — Ambulatory Visit (INDEPENDENT_AMBULATORY_CARE_PROVIDER_SITE_OTHER): Payer: Self-pay | Admitting: Gastroenterology

## 2010-11-28 DIAGNOSIS — R1310 Dysphagia, unspecified: Secondary | ICD-10-CM

## 2010-11-28 DIAGNOSIS — K219 Gastro-esophageal reflux disease without esophagitis: Secondary | ICD-10-CM

## 2010-11-28 DIAGNOSIS — R079 Chest pain, unspecified: Secondary | ICD-10-CM

## 2010-11-28 MED ORDER — SUCRALFATE 1 GM/10ML PO SUSP: 1.0000 g | Freq: Four times a day (QID) | ORAL | Status: DC

## 2010-11-28 NOTE — Progress Notes (Signed)
Addended by: Harlow Mares on: 11/28/2010 12:23 PM   Modules accepted: Orders

## 2010-11-28 NOTE — Patient Instructions (Signed)
Your procedure has been scheduled for 11/29/2010, please follow the seperate instructions.  Your prescription(s) have been sent to you pharmacy.

## 2010-11-28 NOTE — Progress Notes (Signed)
This is a 60 year old Caucasian female with chronic acid reflux and known Barrett's mucosa EEGs had several weeks of chest pain with severe odontophagia refractory to oral Diflucan and by mouth Mycostatin mouthwash. Endoscopy one year ago showed evidence of Barrett's mucosa with a hilar hernia. Is on regular PPI therapy. She has been on antibiotics but these were initiated after her problems began. She does have chronic anxiety problems his own arise up am hydrocodone. Other problems include IBS and diverticulosis coli.  Current Medications, Allergies, Past Medical History, Past Surgical History, Family History and Social History were reviewed in [employer] record.  Pertinent Review of Systems Negative... no current cardiovascular, pulmonary, or hepatobiliary complaints.   Physical Exam: Awake, alert, no acute distress. I cannot appreciate stigmata of chronic liver disease. Examination oropharynx and neck is unremarkable. There is no lymphadenopathy or thyromegaly. Chest entirely clear and there are no murmurs gallops or rubs noted. Abdominal exam shows no organomegaly, masses or tenderness. Bowel sounds are normal. Peripheral extremities are unremarkable mental status is normal.    Assessment and Plan: Severe dysphagia and painful swallowing-rule out cystic Candida infection, viral esophagitis, pill esophagitis, or worsening acid reflux. I have added Carafate suspension to her Rx will try to perform endoscopy ASAP. Labs have also been ordered for review. Encounter Diagnoses  Name Primary?  Marland Kitchen Dysphagia   . Chest pain   . Esophageal reflux

## 2010-11-29 ENCOUNTER — Ambulatory Visit (AMBULATORY_SURGERY_CENTER): Payer: Self-pay | Admitting: Gastroenterology

## 2010-11-29 ENCOUNTER — Encounter: Payer: Self-pay | Admitting: Gastroenterology

## 2010-11-29 DIAGNOSIS — K219 Gastro-esophageal reflux disease without esophagitis: Secondary | ICD-10-CM

## 2010-11-29 DIAGNOSIS — K209 Esophagitis, unspecified: Secondary | ICD-10-CM

## 2010-11-29 DIAGNOSIS — K2270 Barrett's esophagus without dysplasia: Secondary | ICD-10-CM

## 2010-11-29 DIAGNOSIS — R1310 Dysphagia, unspecified: Secondary | ICD-10-CM

## 2010-11-29 MED ORDER — SODIUM CHLORIDE 0.9 % IV SOLN: 500.0000 mL | INTRAVENOUS | Status: DC

## 2010-11-29 MED ORDER — LIDOCAINE VISCOUS 2 % MT SOLN: 20.0000 mL | OROMUCOSAL | Status: AC | PRN

## 2010-11-29 NOTE — Progress Notes (Signed)
Patient and spouse stating they can not afford the Carafate ordered on 11/28/10. Discussed with Dr. Jarold Motto, rx. For viscous xylocaine sent electronically per dr. Norval Gable order.

## 2010-11-29 NOTE — Patient Instructions (Addendum)
Follow discharge instructions. Resume medications. Follow dilatation diet today. Call Dr. Norval Gable office for an appointment in 2 weeks. The office will call you with an appointment for a Barium Enema.  A prescription for 100 ml. Of Viscous Xylocaine has been electronically sent to your pharmacy, [facility]. This medication can be used in substitution of the Carafate.

## 2010-12-02 ENCOUNTER — Telehealth: Payer: Self-pay | Admitting: *Deleted

## 2010-12-02 NOTE — Telephone Encounter (Signed)
Follow up Call- Patient questions:  Do you have a fever, pain , or abdominal swelling? no Pain Score  0 *  Have you tolerated food without any problems? no  Have you been able to return to your normal activities? no  Do you have any questions about your discharge instructions: Diet   no Medications  no Follow up visit  no  Do you have questions or concerns about your Care? no  Actions: * If pain score is 4 or above: No action needed, pain <4.   

## 2010-12-04 ENCOUNTER — Telehealth: Payer: Self-pay | Admitting: Family Medicine

## 2010-12-04 ENCOUNTER — Telehealth: Payer: Self-pay | Admitting: *Deleted

## 2010-12-04 NOTE — Telephone Encounter (Signed)
Pt req samples of Nexium 40 mg. Pls call when ready for pick up.

## 2010-12-05 ENCOUNTER — Encounter: Payer: Self-pay | Admitting: *Deleted

## 2010-12-05 NOTE — Telephone Encounter (Signed)
Lmom notifiing pt pt of her f/u appt with Dr Jarold Motto on 12/13/10 at 1115am. Also mailed a letter with appt info.

## 2010-12-05 NOTE — Telephone Encounter (Signed)
Pt is aware that samples are ready for pickup  

## 2010-12-06 ENCOUNTER — Encounter: Payer: Self-pay | Admitting: Gastroenterology

## 2010-12-13 ENCOUNTER — Ambulatory Visit (INDEPENDENT_AMBULATORY_CARE_PROVIDER_SITE_OTHER): Payer: Self-pay | Admitting: Gastroenterology

## 2010-12-13 ENCOUNTER — Other Ambulatory Visit (INDEPENDENT_AMBULATORY_CARE_PROVIDER_SITE_OTHER): Payer: Self-pay

## 2010-12-13 ENCOUNTER — Encounter: Payer: Self-pay | Admitting: Gastroenterology

## 2010-12-13 DIAGNOSIS — R079 Chest pain, unspecified: Secondary | ICD-10-CM | POA: Insufficient documentation

## 2010-12-13 DIAGNOSIS — K219 Gastro-esophageal reflux disease without esophagitis: Secondary | ICD-10-CM

## 2010-12-13 DIAGNOSIS — F419 Anxiety disorder, unspecified: Secondary | ICD-10-CM

## 2010-12-13 LAB — CBC WITH DIFFERENTIAL/PLATELET
Basophils Absolute: 0 10*3/uL (ref 0.0–0.1)
Eosinophils Relative: 2.1 % (ref 0.0–5.0)
HCT: 41.2 % (ref 36.0–46.0)
Lymphocytes Relative: 34.2 % (ref 12.0–46.0)
Lymphs Abs: 2.2 10*3/uL (ref 0.7–4.0)
Monocytes Relative: 8.2 % (ref 3.0–12.0)
Neutrophils Relative %: 55.2 % (ref 43.0–77.0)
Platelets: 299 10*3/uL (ref 150.0–400.0)
RDW: 12.5 % (ref 11.5–14.6)
WBC: 6.3 10*3/uL (ref 4.5–10.5)

## 2010-12-13 LAB — BASIC METABOLIC PANEL
BUN: 8 mg/dL (ref 6–23)
Chloride: 107 mEq/L (ref 96–112)
Creatinine, Ser: 0.6 mg/dL (ref 0.4–1.2)
GFR: 106.19 mL/min (ref >60.00)
Glucose, Bld: 81 mg/dL (ref 70–99)

## 2010-12-13 LAB — TSH: TSH: 0.66 u[IU]/mL (ref 0.35–5.50)

## 2010-12-13 LAB — SEDIMENTATION RATE: Sed Rate: 25 mm/hr — ABNORMAL HIGH (ref 0–22)

## 2010-12-13 LAB — HEPATIC FUNCTION PANEL
AST: 22 U/L (ref 0–37)
Albumin: 3.9 g/dL (ref 3.5–5.2)
Total Protein: 7.4 g/dL (ref 6.0–8.3)

## 2010-12-13 LAB — IBC PANEL
Iron: 74 ug/dL (ref 42–145)
Transferrin: 338.4 mg/dL (ref 212.0–360.0)

## 2010-12-13 LAB — FOLATE: Folate: >24.8 ng/mL (ref >5.9)

## 2010-12-13 LAB — VITAMIN B12: Vitamin B-12: 1175 pg/mL — ABNORMAL HIGH (ref 211–911)

## 2010-12-13 LAB — FERRITIN: Ferritin: 21 ng/mL (ref 10.0–291.0)

## 2010-12-13 MED ORDER — HYDROCODONE-ACETAMINOPHEN 10-650 MG PO TABS: 1.0000 | ORAL_TABLET | Freq: Three times a day (TID) | ORAL | Status: DC | PRN

## 2010-12-13 NOTE — Progress Notes (Signed)
This is a 60 year old Caucasian female who has recurrent chest pain of unexplained etiology felt secondary possibly to acid reflux. Recent endoscopy was performed and showed a hiatal hernia but biopsies were all unremarkable. She is on twice a day PPI therapy and Carafate suspension and anti-spasmodics without improvement. Last ultrasound was several years ago. She denies any specific hepatobiliary lower gastrointestinal complaints. She has had cardiac workups and denies any cardiopulmonary symptomatology.  Current Medications, Allergies, Past Medical History, Past Surgical History, Family History and Social History were reviewed in [employer] record.  Pertinent Review of Systems Negative   Physical Exam: Awake and alert in no acute distress without stigmata of chronic liver disease. Her chest is clear and there were no murmurs gallops or rubs noted. Abdominal exam is entirely benign without organomegaly, masses or tenderness. Bowel sounds are normal. Peripheral extremities are unremarkable mental status is clear.   Assessment and Plan: Atypical chest pain---consider primary esophageal spasm. I have placed her on hydrocodone with acetaminophen 10 mg-325 every 6-8 hours for pain while continuing all other medications. I have ordered labs for review and also barium swallow. We probably will need to schedule esophageal manometry and 24-hour pH probe testing. Encounter Diagnoses  Name Primary?  . Esophageal reflux   . Chest pain

## 2010-12-13 NOTE — Patient Instructions (Addendum)
We have printed your hydrocodone rx and given it to you today. Your Barium Swallow is scheduled for 12/16/2011 at [facility], please have nothing to eat or drink after midnight and arrive 15 min early.  Make an appt to come back and see Dr. Jarold Motto in 2 weeks.  Please go to the basement today for your labs.

## 2010-12-16 ENCOUNTER — Telehealth: Payer: Self-pay | Admitting: Gastroenterology

## 2010-12-16 ENCOUNTER — Ambulatory Visit (HOSPITAL_COMMUNITY)
Admission: RE | Admit: 2010-12-16 | Discharge: 2010-12-16 | Disposition: A | Discharge status: Home / Self Care | Payer: Self-pay | Source: Ambulatory Visit | Attending: Gastroenterology | Admitting: Gastroenterology

## 2010-12-16 DIAGNOSIS — K2289 Other specified disease of esophagus: Secondary | ICD-10-CM | POA: Insufficient documentation

## 2010-12-16 DIAGNOSIS — K449 Diaphragmatic hernia without obstruction or gangrene: Secondary | ICD-10-CM | POA: Insufficient documentation

## 2010-12-16 DIAGNOSIS — K219 Gastro-esophageal reflux disease without esophagitis: Secondary | ICD-10-CM

## 2010-12-16 DIAGNOSIS — K228 Other specified diseases of esophagus: Secondary | ICD-10-CM | POA: Insufficient documentation

## 2010-12-16 NOTE — Telephone Encounter (Signed)
Pt is at the hospital having a barium swallow. Received call from radiology, pt is having chest pain. Instructed radiology tech to have the pt go to the ER to be evaluated for the chest pain.

## 2010-12-16 NOTE — Telephone Encounter (Signed)
Left message for pt to call back  °

## 2010-12-17 ENCOUNTER — Ambulatory Visit (INDEPENDENT_AMBULATORY_CARE_PROVIDER_SITE_OTHER): Payer: Self-pay | Admitting: Internal Medicine

## 2010-12-17 ENCOUNTER — Ambulatory Visit (INDEPENDENT_AMBULATORY_CARE_PROVIDER_SITE_OTHER)
Admission: RE | Admit: 2010-12-17 | Discharge: 2010-12-17 | Disposition: A | Discharge status: Home / Self Care | Payer: Self-pay | Source: Ambulatory Visit | Attending: Internal Medicine | Admitting: Internal Medicine

## 2010-12-17 ENCOUNTER — Encounter: Payer: Self-pay | Admitting: Internal Medicine

## 2010-12-17 VITALS — BP 136/80 | Temp 98.8°F | Wt 154.0 lb

## 2010-12-17 DIAGNOSIS — R05 Cough: Secondary | ICD-10-CM

## 2010-12-17 DIAGNOSIS — R0781 Pleurodynia: Secondary | ICD-10-CM

## 2010-12-17 DIAGNOSIS — I10 Essential (primary) hypertension: Secondary | ICD-10-CM

## 2010-12-17 DIAGNOSIS — R071 Chest pain on breathing: Secondary | ICD-10-CM

## 2010-12-17 MED ORDER — AMLODIPINE BESYLATE 2.5 MG PO TABS: 2.5000 mg | ORAL_TABLET | Freq: Every day | ORAL | Status: DC

## 2010-12-17 MED ORDER — AMOXICILLIN-POT CLAVULANATE 875-125 MG PO TABS: 1.0000 | ORAL_TABLET | Freq: Two times a day (BID) | ORAL | Status: AC

## 2010-12-18 ENCOUNTER — Telehealth: Payer: Self-pay | Admitting: *Deleted

## 2010-12-18 ENCOUNTER — Telehealth: Payer: Self-pay

## 2010-12-18 DIAGNOSIS — K219 Gastro-esophageal reflux disease without esophagitis: Secondary | ICD-10-CM

## 2010-12-18 NOTE — Telephone Encounter (Signed)
Message copied by Kyung Rudd on Wed Dec 18, 2010 11:03 AM ------      Message from: Letitia Libra, Maisie Fus      Created: Wed Dec 18, 2010  7:50 AM       cxr nl

## 2010-12-18 NOTE — Telephone Encounter (Signed)
Pt aware.

## 2010-12-18 NOTE — Telephone Encounter (Signed)
Message copied by Harlow Mares on Wed Dec 18, 2010  3:15 PM ------      Message from: Jarold Motto, DAVID      Created: Wed Dec 18, 2010  8:34 AM       Please schedule esophageal manometry and also 24-hour pH probe study off PPI medication.

## 2010-12-19 ENCOUNTER — Telehealth: Payer: Self-pay

## 2010-12-19 NOTE — Telephone Encounter (Signed)
Patient aware of prep and date and time, I have also mailed it to her both 24 hour and mano will be done on 01/06/2011 9am booking: [phone]

## 2010-12-20 NOTE — Telephone Encounter (Signed)
Patient has been following with [facility] since February, she will have to call there for refills

## 2010-12-20 NOTE — Telephone Encounter (Signed)
Pharmacy informed.

## 2010-12-22 DIAGNOSIS — R0781 Pleurodynia: Secondary | ICD-10-CM | POA: Insufficient documentation

## 2010-12-22 NOTE — Assessment & Plan Note (Signed)
Improved control. Continue low-dose Norvasc. Monitor blood pressure as an outpatient and followup in clinic with PMD

## 2010-12-22 NOTE — Progress Notes (Signed)
  Subjective:    Patient ID: Megan [name], female    DOB: 1951-03-23, 60 y.o.   MRN: [id number]  HPI Patient presents to clinic for evaluation of cough. His 14 day history of cough felt to be nonproductive. No wheezing or shortness of breath. Does have right pleuritic pain and intermittent sweats. Denies fever or chills. History of hypertension and states has been off Toprol and instead resumed previous Norvasc. Tolerates without lower extremity swelling and has improved blood pressure control. No alleviating or exacerbating factors. No other complaints.  Reviewed past medical history, medications and allergies    Review of Systems  Constitutional: Positive for diaphoresis. Negative for fever and chills.  HENT: Positive for congestion.   Respiratory: Positive for cough. Negative for shortness of breath and wheezing.   Cardiovascular: Positive for chest pain. Negative for palpitations.  All other systems reviewed and are negative.       Objective:   Physical Exam    Physical Exam  Vitals reviewed. Constitutional:  appears well-developed and well-nourished. No distress.  HENT:  Head: Normocephalic and atraumatic.  Right Ear: Tympanic membrane, external ear and ear canal normal.  Left Ear: Tympanic membrane, external ear and ear canal normal.  Nose: Nose normal.  Mouth/Throat: Oropharynx is clear and moist. No oropharyngeal exudate.  Eyes: Conjunctivae and EOM are normal. Pupils are equal, round, and reactive to light. Right eye exhibits no discharge. Left eye exhibits no discharge. No scleral icterus.  Neck: Neck supple. No thyromegaly present.  Cardiovascular: Normal rate, regular rhythm and normal heart sounds.  Exam reveals no gallop and no friction rub.   No murmur heard. Pulmonary/Chest: Effort normal and breath sounds normal. No respiratory distress.  has no wheezes.  has no rales.  Lymphadenopathy:   no cervical adenopathy.  Neurological:  is alert.  Skin: Skin is  warm and dry.  not diaphoretic.  Psychiatric: normal mood and affect.      Assessment & Plan:

## 2010-12-22 NOTE — Assessment & Plan Note (Signed)
Obtain chest x-ray. Begin ten-day course of Augmentin. Follow closely if no improvement or worsening.

## 2010-12-24 ENCOUNTER — Ambulatory Visit: Payer: Self-pay | Admitting: Internal Medicine

## 2010-12-24 ENCOUNTER — Other Ambulatory Visit: Payer: Self-pay

## 2010-12-24 MED ORDER — HYDROCODONE-ACETAMINOPHEN 10-650 MG PO TABS: 1.0000 | ORAL_TABLET | Freq: Three times a day (TID) | ORAL | Status: DC | PRN

## 2010-12-24 NOTE — Telephone Encounter (Signed)
rx phoned in to [facility] on [address] for hydrocodone-acte 10-650 with 5 refills

## 2010-12-30 ENCOUNTER — Telehealth: Payer: Self-pay | Admitting: *Deleted

## 2010-12-30 NOTE — Telephone Encounter (Signed)
Pt does not like the Valium, and needs Dr. Scotty Court to call in Xanax to [facility].

## 2010-12-31 ENCOUNTER — Encounter: Payer: Self-pay | Admitting: Gastroenterology

## 2010-12-31 ENCOUNTER — Ambulatory Visit (INDEPENDENT_AMBULATORY_CARE_PROVIDER_SITE_OTHER): Payer: Self-pay | Admitting: Gastroenterology

## 2010-12-31 VITALS — BP 130/72 | HR 88 | Ht 66.0 in | Wt 155.2 lb

## 2010-12-31 DIAGNOSIS — K224 Dyskinesia of esophagus: Secondary | ICD-10-CM

## 2010-12-31 DIAGNOSIS — K219 Gastro-esophageal reflux disease without esophagitis: Secondary | ICD-10-CM

## 2010-12-31 MED ORDER — GLYCOPYRROLATE 2 MG PO TABS: 2.0000 mg | ORAL_TABLET | Freq: Three times a day (TID) | ORAL | Status: DC

## 2010-12-31 NOTE — Progress Notes (Signed)
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 She currently is doing well on Nexium 40 mg a day with when necessary Robinull 2 mg. Apparently she was recently treated for acute bronchitis despite a normal chest x-ray. Within the last month she's had endoscopy and barium swallow. Biopsies of her lower esophageal area not show evidence of Barrett's mucosa. She currently is without chest pain or dysphagia or hepatobiliary or lower gastrointestinal symptoms.  Current Medications, Allergies, Past Medical History, Past Surgical History, Family History and Social History were reviewed in [employer] record.  Pertinent Review of Systems Negative       Assessment and Plan: GERD with secondary esophageal spasm well controlled current regime. Patient education video shown to patient and reflect regime reviewed. I will see her on a when necessary basis as needed. No diagnosis found.

## 2010-12-31 NOTE — Patient Instructions (Signed)
Today you watched the [employer].  We have cancelled your mano and 24 hours probe.

## 2010-12-31 NOTE — Assessment & Plan Note (Signed)
[facility]                            CARDIOLOGY OFFICE NOTE   Megan [name], Megan [name]                   MRN:          [id number]  DATE:09/15/2008                            DOB:          December 04, 1950    REASON FOR CONSULTATION:  Evaluate the patient with chest pain.   HISTORY OF PRESENT ILLNESS:  Megan [name] is a very nice 60 year old  woman who presents with 2-3 weeks of dull, constant chest pain causing a  squeezing sensation in the left chest under the left breast radiating to  the jaw and left arm, as well as to the back.  Her pain is nearly been  constant over this time period.  It is not exacerbated by exertion.  The  intensity waxes and wanes.  She has had similar chest pains in the past,  but they have not been this severe.  She has been told in the past that  she has mitral valve prolapse.  She also complains of night sweats that  have progressively worsened.  She has had no fevers, appetite changes,  or other constitutional symptoms.  She denies palpitations,  lightheadedness, or syncope.  She has also been undergoing an evaluation  of GI symptoms such as abdominal bloating and alternating diarrhea and  constipation.  She has undergone extensive evaluation including  colonoscopy, endoscopy, and abdominal CT scan.  She has been diagnosed  with irritable bowel syndrome.   CURRENT MEDICATIONS:  Calcium Plus D daily, aspirin 81 mg approximately  3 times daily, and cranberry vitamin 250 mg daily.   ALLERGIES:  SULFA and CONTRAST MEDIA.   PAST MEDICAL HISTORY:  Includes question of mitral valve prolapse,  anxiety, depression, irritable bowel syndrome, gastroesophageal reflux  disease.   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   FAMILY HISTORY:  Pertinent for colon cancer in her mother in her 56s,  Parkinson disease in her father in his 94s, and no history of coronary  artery disease in any first degree family members.   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  She keeps children in her home.  No tobacco at present, but  smoked until 5 years ago.  No history of alcohol or illicit drug use.  She is not engaged in regular exercise.   REVIEW OF SYSTEMS:  Pertinent positives included headaches, calf  cramping, fatigue.  Otherwise as reviewed above.  A complete 12-point  review of systems was performed and there were no other pertinent  positives to report.   PHYSICAL EXAMINATION:  GENERAL:  The patient is alert and oriented.  She  is somewhat anxious appearing woman in no acute distress.  VITAL SIGNS:  Weight is 144 pounds, blood pressure 130/80, heart rate  80, respiratory rate 16.  HEENT:  Normal neck, normal carotid upstrokes.  No bruits.  JVP normal.  LUNGS:  Clear bilaterally.  HEART:  Regular rate and rhythm.  The apical impulse is normal.  There  is a midsystolic click elicited when she  goes from the squatting to  standing position.  There are no murmurs or gallops.  ABDOMEN:  Soft, nontender, no organomegaly.  Positive bowel sounds.  EXTREMITIES:  No clubbing, cyanosis, or edema.  Peripheral pulses are  intact and equal.  SKIN:  Warm and dry without rash.  BACK:  No CVA tenderness.  NEUROLOGIC:  Cranial nerves II through XII are intact.  Strength is  intact and equal.   EKG shows normal sinus rhythm with a right bundle-branch block,  otherwise within normal limits.   ASSESSMENT:  This is a 60 year old woman with chest pain as detailed  above.  Her symptoms are atypical in that she is having a prolonged  episode of near constant chest pain.  She carries a diagnosis of mitral  valve prolapse and her exam is consistent with this.  In order to rule  out significant obstructive coronary disease, an exercise echocardiogram  was performed in the office today.  The official interpretation is  pending, but I  have preliminarily reviewed this.  There were no  significant EKG changes.  The echo images demonstrated normal to  hyperdynamic left ventricular function at rest with no wall motion  abnormalities at peak exercise.   I think her stress test results are reassuring.  I suspect her chest  pain is related to mitral valve prolapse.  I have taken the liberty of  starting her on long-acting metoprolol 25 mg daily.  She will follow up  here in 6 months' time.  She really does not have other cardiac risk  factors present.  However, I do not have copies of her lipid values.   I appreciate the opportunity to see [name].  She is scheduled  for 66-month followup and I would be happy to see her sooner if any new  problems arise.     Veverly Fells. Excell Seltzer, MD  Electronically Signed    MDC/MedQ  DD: 09/15/2008  DT: 09/16/2008  Job #: [id number]   cc:   Ellin Saba., MD  Vania Rea Jarold Motto, MD, [name], FAGA

## 2011-01-01 ENCOUNTER — Other Ambulatory Visit: Payer: Self-pay

## 2011-01-01 MED ORDER — ALPRAZOLAM 2 MG PO TABS: ORAL_TABLET | ORAL | Status: DC

## 2011-01-01 NOTE — Telephone Encounter (Signed)
rx faxed [facility] for alprazolam 2mg 

## 2011-01-15 ENCOUNTER — Telehealth: Payer: Self-pay | Admitting: Family Medicine

## 2011-01-15 NOTE — Telephone Encounter (Signed)
Pt req samples of Nexium 40 mg. Pls call when ready.

## 2011-01-16 ENCOUNTER — Telehealth: Payer: Self-pay | Admitting: Gastroenterology

## 2011-01-16 NOTE — Telephone Encounter (Signed)
lmom for pt to call back

## 2011-01-17 NOTE — Telephone Encounter (Signed)
Again, LMOM to call back. Closed out encounter.

## 2011-01-21 NOTE — Telephone Encounter (Signed)
Pt is aware that no samples of nexium are available at this time.

## 2011-02-11 ENCOUNTER — Ambulatory Visit (INDEPENDENT_AMBULATORY_CARE_PROVIDER_SITE_OTHER)
Admission: RE | Admit: 2011-02-11 | Discharge: 2011-02-11 | Disposition: A | Discharge status: Home / Self Care | Payer: Self-pay | Source: Ambulatory Visit | Attending: Cardiovascular Disease | Admitting: Cardiovascular Disease

## 2011-02-11 ENCOUNTER — Other Ambulatory Visit (INDEPENDENT_AMBULATORY_CARE_PROVIDER_SITE_OTHER): Payer: Self-pay

## 2011-02-11 ENCOUNTER — Other Ambulatory Visit: Payer: Self-pay | Admitting: Gastroenterology

## 2011-02-11 ENCOUNTER — Encounter: Payer: Self-pay | Admitting: Gastroenterology

## 2011-02-11 ENCOUNTER — Ambulatory Visit (INDEPENDENT_AMBULATORY_CARE_PROVIDER_SITE_OTHER): Payer: Self-pay | Admitting: Gastroenterology

## 2011-02-11 DIAGNOSIS — R109 Unspecified abdominal pain: Secondary | ICD-10-CM

## 2011-02-11 DIAGNOSIS — K219 Gastro-esophageal reflux disease without esophagitis: Secondary | ICD-10-CM

## 2011-02-11 MED ORDER — ESOMEPRAZOLE MAGNESIUM 40 MG PO CPDR: 40.0000 mg | DELAYED_RELEASE_CAPSULE | Freq: Every day | ORAL | Status: DC

## 2011-02-11 NOTE — Progress Notes (Signed)
This is a 60 year old Caucasian female who continues with diffuse abdominal pain, regurgitation, acid reflux, and early satiety despite vigorous treatment for acid reflux with PPI therapy, and acid, and when necessary Carafate. She complains of abdominal gas, bloating, and diffuse abdominal discomfort. Her history is positive for previous removal of an ovarian tumor many years ago. She allegedly has yearly GYN followup. She has not had previous abdominal surgical procedures.  Current Medications, Allergies, Past Medical History, Past Surgical History, Family History and Social History were reviewed in [employer] record.  Pertinent Review of Systems Negative   Past Medical History  Diagnosis Date  . GERD (gastroesophageal reflux disease)   . Depression   . Anxiety   . Insomnia   . IBS (irritable bowel syndrome)   . Diverticulosis   . Barrett esophagus   . Hiatal hernia   . Unspecified menopausal and postmenopausal disorder   . Vitamin B12 deficiency   . Blind loop syndrome   . Hepatic cyst   . Unspecified hypothyroidism   . Diarrhea   . Family history of malignant neoplasm of gastrointestinal tract     multiple members   Past Surgical History  Procedure Date  . Appendectomy   . Uterine fibroid surgery     reports that she quit smoking about 15 years ago. Her smoking use included Cigarettes. She has never used smokeless tobacco. She reports that she does not drink alcohol or use illicit drugs. family history includes Breast cancer in her paternal grandmother; Colon cancer in her cousin, maternal aunt, maternal grandmother, and mother; Colon polyps in her father; Diabetes in her mother and paternal grandmother; and Ovarian cancer in her cousin. Allergies  Allergen Reactions  . Sulfamethoxazole Shortness Of Breath    chest tightness      Physical Exam: Her abdomen is distended without localized masses or tenderness or organomegaly. Bowel sounds are fairly  normal. Rectal exam is deferred.     A and P: Rule out intra-abdominal inflammatory process or partial bowel obstruction. I am impressed that he rabdomen  more distended and on previous exams. Her last CT scan was in 2004. We have scheduled repeat abdominal pelvic CT scan and we'll proceed accordingly. If this is unremarkable we will proceed with gastric emptying scan perhaps manometry and 24-hour pH probe testing. She is to continue other medications as listed above in the enterium.

## 2011-02-11 NOTE — Patient Instructions (Signed)
You have been scheduled for a CT scan of the abdomen and pelvis at  CT ([address]---this is in the same building as [facility]).   You are scheduled on 02/11/2011 at 3:30pm. You should arrive 15 minutes prior to your appointment time for registration. Please follow the written instructions below on the day of your exam:  1) Do not eat or drink anything after 11:30am (4 hours prior to your test) 2) You have been given 2 bottles of oral contrast to drink. The solution may taste better if refrigerated, but do NOT add ice or any other liquid to this solution. Shake well before drinking.  Drink 1 bottle of contrast @ 1:30pm (2 hours prior to your exam)  Drink 1 bottle of contrast @ 2:30pm (1 hour prior to your exam)  You may take any medications as prescribed with a small amount of water except for the following: Metformin, Glucophage, Glucovance, Avandamet, Riomet, Fortamet, Actoplus Met, Janumet, Glumetza or Metaglip. The above medications must be held the day of the exam and 48 hours after the exam.  The purpose of you drinking the oral contrast is to aid in the visualization of your intestinal tract. The contrast solution may cause some diarrhea. Before your exam is started, you will be given a small amount of fluid to drink. Depending on your individual set of symptoms, you may also receive an intravenous injection of x-ray contrast/dye.  If you have any questions regarding your exam or if you need to reschedule, you may call the CT department at [phone] between the hours of 8:00 am and 5:00 pm, Monday-Friday.  ________________________________________________________________________ Please go to the basement today for your labs.  We have printed your Nexium Rx you can mail it in to Patient assistance.

## 2011-02-12 ENCOUNTER — Telehealth: Payer: Self-pay | Admitting: *Deleted

## 2011-02-12 DIAGNOSIS — K219 Gastro-esophageal reflux disease without esophagitis: Secondary | ICD-10-CM

## 2011-02-12 DIAGNOSIS — R140 Abdominal distension (gaseous): Secondary | ICD-10-CM

## 2011-02-12 DIAGNOSIS — IMO0001 Reserved for inherently not codable concepts without codable children: Secondary | ICD-10-CM

## 2011-02-12 DIAGNOSIS — R6881 Early satiety: Secondary | ICD-10-CM

## 2011-02-12 NOTE — Telephone Encounter (Signed)
Notified pt I scheduled her for GES on 03/06/2011 at 0800, arrive at 0745am. Npo after midnight; hold all stomach meds x 24 hours; pt stated understanding.

## 2011-03-04 ENCOUNTER — Encounter: Payer: Self-pay | Admitting: Family Medicine

## 2011-03-04 ENCOUNTER — Ambulatory Visit (INDEPENDENT_AMBULATORY_CARE_PROVIDER_SITE_OTHER): Payer: Self-pay | Admitting: Family Medicine

## 2011-03-04 DIAGNOSIS — H6691 Otitis media, unspecified, right ear: Secondary | ICD-10-CM

## 2011-03-04 DIAGNOSIS — Z8719 Personal history of other diseases of the digestive system: Secondary | ICD-10-CM | POA: Insufficient documentation

## 2011-03-04 DIAGNOSIS — K449 Diaphragmatic hernia without obstruction or gangrene: Secondary | ICD-10-CM

## 2011-03-04 DIAGNOSIS — H6690 Otitis media, unspecified, unspecified ear: Secondary | ICD-10-CM

## 2011-03-04 DIAGNOSIS — J029 Acute pharyngitis, unspecified: Secondary | ICD-10-CM

## 2011-03-04 DIAGNOSIS — I10 Essential (primary) hypertension: Secondary | ICD-10-CM

## 2011-03-04 DIAGNOSIS — K2270 Barrett's esophagus without dysplasia: Secondary | ICD-10-CM

## 2011-03-04 MED ORDER — AMOXICILLIN 500 MG PO CAPS
500.0000 mg | ORAL_CAPSULE | Freq: Two times a day (BID) | ORAL | Status: DC
Start: 2011-03-04 — End: 2011-03-04

## 2011-03-04 MED ORDER — AMOXICILLIN 500 MG PO CAPS: 500.0000 mg | ORAL_CAPSULE | Freq: Three times a day (TID) | ORAL | Status: AC

## 2011-03-04 NOTE — Patient Instructions (Signed)
ACUTE PHARYNGITIS   To treat  With amoxicillin 500mg  three times daily after meals Continue other medications for hiatal hernia , Dr. Eloise Harman will probably consider surgery since the Barrett's esophagus is getting much worse, refill medications up-to-datej

## 2011-03-04 NOTE — Progress Notes (Signed)
  Subjective:    Patient ID: Megan [name], female    DOB: March 10, 1951, 60 y.o.   MRN: [id number] This 60 year old white married female with known history of Barrett's esophagus and hiatal hernia is treated by gastroenterologist Dr. Sheryn Bison. Is having such severe symptoms of esophagitis and not improving, other studies are being ordered The patient is in today complaining of severe sore throat stating throat is wrong and burning. Pain when eating and swallowing food also some discomfort in the right ear there No cough no chest congestion, no fever  HPI    Review of Systems see history and physical regarding review of systems no other complaints     Objective:   Physical Exam the patient is a well-developed well-nourished white female who does not appear her stated age of 60, however does appear to be having pain however but not in distress HEENT reveals pharynx to be erythematous and   small anterior lymph nodes, is slightly erythematous right tympanic membrane Lung examination reveals lungs to be clear to palpation percussion and auscultation no rales no wheezing no dullness Heart examination reveals no cardiomegaly heart sounds are good without murmurs regular rhythm peripheral pulses are good Abdomen ambulation reveals epigastric tenderness, liver spleen and kidneys are nonpalpable no masses bowel sounds minimally hyperactive       Assessment & Plan:  Examination reveals the patient to have acute pharyngitis and right otitis media 2 treat with amoxicillin 500 mg 3 times daily for 10 days Hiatal hernia and Barrett's esophagitis to continue treatment as has been planned 2 continuing the care of Dr. Jarold Motto Hypertension is well controlled no change in treatment Medications refills are up today

## 2011-03-05 ENCOUNTER — Telehealth: Payer: Self-pay | Admitting: Gastroenterology

## 2011-03-05 NOTE — Telephone Encounter (Signed)
Scan needed,,,,

## 2011-03-05 NOTE — Telephone Encounter (Signed)
Pt saw Dr Scotty Court yesterday for sore throat:  " ACUTE PHARYNGITIS To treat With amoxicillin 500mg  three times daily after meals  Continue other medications for hiatal hernia , Dr. Eloise Harman will probably consider surgery since the Barrett's esophagus is getting much worse, refill medications up-to-date". She reports her HH has really been acting up the past 2 weeks. Pt is scheduled for GES tomorrow. Pt wants to know should she continue with GES or be referred to a surgeon? Please advise.

## 2011-03-05 NOTE — Telephone Encounter (Signed)
Notified pt Dr Jarold Motto stated she needs to go ahead with the GES tomorrow; pt stated understanding.

## 2011-03-06 ENCOUNTER — Encounter (HOSPITAL_COMMUNITY)
Admission: RE | Admit: 2011-03-06 | Discharge: 2011-03-06 | Disposition: A | Discharge status: Home / Self Care | Payer: Self-pay | Source: Ambulatory Visit | Attending: Gastroenterology | Admitting: Gastroenterology

## 2011-03-06 DIAGNOSIS — K219 Gastro-esophageal reflux disease without esophagitis: Secondary | ICD-10-CM

## 2011-03-06 DIAGNOSIS — R143 Flatulence: Secondary | ICD-10-CM | POA: Insufficient documentation

## 2011-03-06 DIAGNOSIS — R141 Gas pain: Secondary | ICD-10-CM | POA: Insufficient documentation

## 2011-03-06 DIAGNOSIS — IMO0001 Reserved for inherently not codable concepts without codable children: Secondary | ICD-10-CM

## 2011-03-06 DIAGNOSIS — R142 Eructation: Secondary | ICD-10-CM | POA: Insufficient documentation

## 2011-03-06 DIAGNOSIS — R109 Unspecified abdominal pain: Secondary | ICD-10-CM | POA: Insufficient documentation

## 2011-03-06 DIAGNOSIS — R140 Abdominal distension (gaseous): Secondary | ICD-10-CM

## 2011-03-06 DIAGNOSIS — R6881 Early satiety: Secondary | ICD-10-CM

## 2011-03-06 MED ORDER — TECHNETIUM TC 99M SULFUR COLLOID
2.1000 | Freq: Once | INTRAVENOUS | Status: AC | PRN
  Administered 2011-03-06: 2.1 via INTRAVENOUS

## 2011-03-07 ENCOUNTER — Telehealth: Payer: Self-pay | Admitting: Gastroenterology

## 2011-03-07 NOTE — Telephone Encounter (Signed)
Informed pt the GES is normal per the radiologist. Pt stated she is hurting so bad, her throat burns and she can't sleep. On Dr Norval Gable last note he stated: A and P: Rule out intra-abdominal inflammatory process or partial bowel obstruction. I am impressed that he rabdomen more distended and on previous exams. Her last CT scan was in 2004. We have scheduled repeat abdominal pelvic CT scan and we'll proceed accordingly. If this is unremarkable we will proceed with gastric emptying scan perhaps manometry and 24-hour pH probe testing. She is to continue other medications as listed above in the enterium. When I read this to her, she stated Dr Jarold Motto decided not to do the 24hr Probe. Pt had Carafate listed as an old med; offered to refill that but she stated it didn't do any good. Pt is afraid all the acid is ruining her throat and doing harm. OK to refer to surgeon? Thanks.

## 2011-03-10 NOTE — Telephone Encounter (Signed)
She needs manometry and 24-hour pH probe testing before surgical referral.

## 2011-03-10 NOTE — Telephone Encounter (Signed)
Notified pt of her appt for Esophageal Manometry and 24 hr PH Probe study on 03/31/11 at 11:30am; arrive at 1100am. Instructions mailed to pt's home and order faxed to [phone]. Booking # [id number] with Noreene Larsson.

## 2011-03-14 ENCOUNTER — Other Ambulatory Visit: Payer: Self-pay

## 2011-03-14 MED ORDER — ALBUTEROL SULFATE HFA 108 (90 BASE) MCG/ACT IN AERS: 2.0000 | INHALATION_SPRAY | Freq: Four times a day (QID) | RESPIRATORY_TRACT | Status: DC | PRN

## 2011-03-14 NOTE — Telephone Encounter (Signed)
Pt called requesting an inhaler sent to pharmacy.  Pt is going on a trip and wants to have it in case she needs it.  Rx sent to pharmacy.

## 2011-03-24 ENCOUNTER — Telehealth: Payer: Self-pay | Admitting: Gastroenterology

## 2011-03-24 DIAGNOSIS — R197 Diarrhea, unspecified: Secondary | ICD-10-CM

## 2011-03-24 NOTE — Telephone Encounter (Signed)
NEEDS C.DIFF BY PCR.Marland KitchenMarland Kitchen

## 2011-03-24 NOTE — Telephone Encounter (Signed)
Notified pt she needs to come in and get a container for a stool sample; pt stated understanding.

## 2011-03-24 NOTE — Telephone Encounter (Signed)
Last OV 02/11/11 for Abd. Pain, Regurgitation, Acid Reflux, Early Satiety. CT scan ordered was negative. 03/06/11 GES done that was normal. She is scheduled for Esophageal Manometry and 24 hr PH Probe on 03/31/11. Today, pt reports diarrhea that started last week and with cramping in her lower stomach; nothing helps or slows down the cramping. Pt has tried Robinul and Imodium w/o any success and she reports she is very weak. If anything is ordered, use [facility]. Please advise.

## 2011-03-25 ENCOUNTER — Other Ambulatory Visit: Payer: Self-pay

## 2011-03-25 ENCOUNTER — Telehealth: Payer: Self-pay | Admitting: Gastroenterology

## 2011-03-25 DIAGNOSIS — R197 Diarrhea, unspecified: Secondary | ICD-10-CM

## 2011-03-25 NOTE — Telephone Encounter (Signed)
Per call yesterday, Imodium did not help the pain or diarrhea. She now has nausea. Please advise. Thanks.

## 2011-03-25 NOTE — Telephone Encounter (Signed)
Prn imodium is ok

## 2011-03-25 NOTE — Telephone Encounter (Signed)
Pt called yesterday for diarrhea and abdominal cramping. She brought in the stool sample today for CDIFF and I informed her the results would not be back for a day or two. Pt reports she still has diarrhea, she's still having abdominal pain and now she has nausea. Please advise. Thanks.

## 2011-03-26 ENCOUNTER — Telehealth: Payer: Self-pay | Admitting: *Deleted

## 2011-03-26 MED ORDER — SACCHAROMYCES BOULARDII 250 MG PO CAPS: ORAL_CAPSULE | ORAL | Status: DC

## 2011-03-26 MED ORDER — VANCOMYCIN HCL 125 MG PO CAPS: ORAL_CAPSULE | ORAL | Status: DC

## 2011-03-26 MED ORDER — ONDANSETRON HCL 4 MG PO TABS: ORAL_TABLET | ORAL | Status: DC

## 2011-03-26 NOTE — Telephone Encounter (Signed)
Informed pt Dr Jarold Motto suggests she come in to see Willette Cluster, NP tomorrow at 0930am. Pt asked if CDIFF is back and it isn't. I will call her if it comes back today. Pt will see [place] tomorrow.

## 2011-03-26 NOTE — Telephone Encounter (Signed)
See pa 

## 2011-03-26 NOTE — Telephone Encounter (Signed)
Notified pt she is positive for CDIFF and needs to be on medications. We are also cancelling her appt tomorrow with Gunnar Fusi. Pt reports she is still bothered with cramping, diarrhea and now nausea. Per Dr Jarold Motto, ordered Vanc 125mg  qid x 2 weeks, then 125mg  bid x 2 weeks; Florastor bid x 2 weeks; Zofran 4 mg po q8hr prn to [facility]. Pt will see Dr Jarold Motto on 04/24/11 at 1030am.

## 2011-03-27 ENCOUNTER — Encounter: Payer: Self-pay | Admitting: *Deleted

## 2011-03-27 ENCOUNTER — Ambulatory Visit: Payer: Self-pay | Admitting: Nurse Practitioner

## 2011-03-27 ENCOUNTER — Telehealth: Payer: Self-pay | Admitting: Gastroenterology

## 2011-03-27 MED ORDER — VANCOMYCIN 50 MG/ML ORAL SOLUTION: ORAL | Status: DC

## 2011-03-27 NOTE — Telephone Encounter (Signed)
Sarah at [facility] called to report the Vanc pills would cost $1800, but pt can get a liquid vanc at [facility] for $200. Spoke with Tamela Oddi at [facility], [phone], and gave a verbal order for: Vancomycin 50mg /ml, 1/2 tsp 4 x daily for 2 weeks, then 1/2 twice daily for 2 weeks.

## 2011-03-31 ENCOUNTER — Encounter: Payer: Self-pay | Admitting: Gastroenterology

## 2011-03-31 ENCOUNTER — Ambulatory Visit (HOSPITAL_COMMUNITY): Admission: RE | Admit: 2011-03-31 | Payer: Self-pay | Source: Ambulatory Visit | Admitting: Gastroenterology

## 2011-03-31 ENCOUNTER — Ambulatory Visit (HOSPITAL_COMMUNITY)
Admission: RE | Admit: 2011-03-31 | Discharge: 2011-03-31 | Disposition: A | Discharge status: Home / Self Care | Payer: Self-pay | Source: Ambulatory Visit | Attending: Gastroenterology | Admitting: Gastroenterology

## 2011-03-31 DIAGNOSIS — R1310 Dysphagia, unspecified: Secondary | ICD-10-CM | POA: Insufficient documentation

## 2011-03-31 DIAGNOSIS — K219 Gastro-esophageal reflux disease without esophagitis: Secondary | ICD-10-CM

## 2011-04-10 ENCOUNTER — Telehealth: Payer: Self-pay | Admitting: Gastroenterology

## 2011-04-10 ENCOUNTER — Telehealth: Payer: Self-pay | Admitting: Family Medicine

## 2011-04-10 MED ORDER — PROCHLORPERAZINE MALEATE 10 MG PO TABS: 10.0000 mg | ORAL_TABLET | Freq: Four times a day (QID) | ORAL | Status: AC | PRN

## 2011-04-10 NOTE — Telephone Encounter (Signed)
Per Dr. Scotty Court, mucinex d xtra strength bid

## 2011-04-10 NOTE — Telephone Encounter (Signed)
Pt called. States she was Dx with C-Diff and is on a 4wk course of Vancomycin. She is now sick with some wort of sinus, cough, throat, congestions thing, and wants to know if anything can be called in. She is unsure what she can take in combination with the Vanco. Please call.

## 2011-04-10 NOTE — Telephone Encounter (Signed)
Pt has nausea and would like something called in to [facility].

## 2011-04-10 NOTE — Telephone Encounter (Signed)
Patient is on vanc for c-diff.  She has developed a sinus infection and sore throat.  She does have a low grade fever.  She is asking if her sinus infection will clear up on Vanc.  I have asked her to go to her primary care MD for evaluation of upper respiratory and sinus symptoms.

## 2011-04-10 NOTE — Telephone Encounter (Signed)
Per Dr. Scotty Court, send compazine 10 mg q 6 hrs prn to pharmacy  Pt notified and verbalized understanding

## 2011-04-11 NOTE — Telephone Encounter (Signed)
I agree with assessment and plan.

## 2011-04-14 ENCOUNTER — Telehealth: Payer: Self-pay | Admitting: *Deleted

## 2011-04-14 MED ORDER — HYDROCODONE-HOMATROPINE 5-1.5 MG/5ML PO SYRP
5.0000 mL | ORAL_SOLUTION | Freq: Four times a day (QID) | ORAL | Status: DC | PRN
Start: 2011-04-14 — End: 2011-04-24

## 2011-04-14 MED ORDER — LEVOFLOXACIN 500 MG PO TABS: 500.0000 mg | ORAL_TABLET | Freq: Every day | ORAL | Status: DC

## 2011-04-14 NOTE — Telephone Encounter (Addendum)
Husband came to lobby with a note and asked that something be done for his wife. He stated he had the flu and he must have given it to her. Pt has a hx of CDIFF and is on her 3rd week of Vanc. The note states she woke up last week with a sore throat and sinus pain. She had a GES on 03/06/11 and she writes in the note this may be the source of her infection as she is now coughing up thick yellow mucus. She called Dr Charmian Muff [facility] and she was instructed to take Mucinex EX. She reports the Mucinex wasn't helping and her coughing and the mucus have gotten worse and she feels as though it has gone to her chest. Pt called Dr Charmian Muff [facility] again today and hasn't heard anything. Pt writes she is suffering at both ends and has suffered all weekend and she reports her temp stays around 99.5-100.1. Dr Jarold Motto, please advise.

## 2011-04-14 NOTE — Progress Notes (Signed)
24 Hour PH Study:: A 24-hour pH probe study was completed on 04/01/2011. There is marked acid reflux in the upright position with total time the pH is less than 4 of 17% in the distal probe. 129 reflux episodes occurred with the longest episode being 8 minutes. There also is some increased acid reflux in the proximal probe in the upright position. The percentage of time the pH was less than 4 recumbent position is also elevated to 11.1%. Total DeMeester score is 71.9, normal less than 42. There is 100% symptom correlation with chest pain and acid reflux recurrence. Total reflux episodes of 214 with the longest episode of 28 minutes.   Assessment: This 24-hour pH probe study shows proximal and distal acid reflux in both upright and recumbent position. This patient may need fundoplication surgery to control her severe acid reflux. Esophageal manometry was also completed which is essentially normal.

## 2011-04-14 NOTE — Telephone Encounter (Signed)
Ok per Dr. Scotty Court to call in levaquin 500 mg x 10 days.

## 2011-04-14 NOTE — Telephone Encounter (Signed)
Hycodan cough syrup called in as well and pt is aware.

## 2011-04-14 NOTE — Telephone Encounter (Signed)
Dr Scotty Court, I am sending this info to you because Dr Jarold Motto thinks it's a PCP issue. I would be happy to fax you a copy of the note the husband brought in. Thanks, Aram Beecham [phone].

## 2011-04-14 NOTE — Progress Notes (Signed)
ESOPHAGEAL MANOMETRY--- 1.UES: Normal coordination between pharyngeal contraction and cricopharyngeal relaxation.  2.LES: Mean pressure is 20 mm of mercury with normal relaxation swallowing  3. Motility Pattern: There is normal peristalsis to both wet and dry swallows. 100% contractions are peristaltic. Mean pressure of contractions is 93 mmHg.  Assessment: This abnormal soft tissue manometry without any evidence of LES INCONTICENCY or an esophageal motility disorder. Since this is a normal esophageal manometric study.

## 2011-04-14 NOTE — Telephone Encounter (Signed)
We will call [facility].Marland KitchenMarland Kitchen

## 2011-04-14 NOTE — Telephone Encounter (Signed)
Done

## 2011-04-14 NOTE — Telephone Encounter (Addendum)
Pt states she is on Vancomycin for C-Diff, but is dealing with a worsening sinus infection with yellow sputum.  Please send in Rx to [facility].  Dr. Norval Gable office called back confirming he feels this is a primary care issue.

## 2011-04-14 NOTE — Telephone Encounter (Signed)
Addended by: Azucena Freed on: 04/14/2011 05:24 PM   Modules accepted: Orders

## 2011-04-17 ENCOUNTER — Emergency Department (HOSPITAL_COMMUNITY)
Admission: EM | Admit: 2011-04-17 | Discharge: 2011-04-17 | Disposition: A | Discharge status: Home / Self Care | Payer: Self-pay | Attending: Emergency Medicine | Admitting: Emergency Medicine

## 2011-04-17 ENCOUNTER — Emergency Department (HOSPITAL_COMMUNITY): Payer: Self-pay

## 2011-04-17 DIAGNOSIS — R197 Diarrhea, unspecified: Secondary | ICD-10-CM | POA: Insufficient documentation

## 2011-04-17 DIAGNOSIS — R0602 Shortness of breath: Secondary | ICD-10-CM | POA: Insufficient documentation

## 2011-04-17 DIAGNOSIS — A0472 Enterocolitis due to Clostridium difficile, not specified as recurrent: Secondary | ICD-10-CM | POA: Insufficient documentation

## 2011-04-17 DIAGNOSIS — R059 Cough, unspecified: Secondary | ICD-10-CM | POA: Insufficient documentation

## 2011-04-17 DIAGNOSIS — R05 Cough: Secondary | ICD-10-CM | POA: Insufficient documentation

## 2011-04-17 DIAGNOSIS — J029 Acute pharyngitis, unspecified: Secondary | ICD-10-CM | POA: Insufficient documentation

## 2011-04-17 LAB — COMPREHENSIVE METABOLIC PANEL
Albumin: 3.7 g/dL (ref 3.5–5.2)
BUN: 6 mg/dL (ref 6–23)
Creatinine, Ser: 0.55 mg/dL (ref 0.50–1.10)
Total Protein: 7.5 g/dL (ref 6.0–8.3)

## 2011-04-17 LAB — URINALYSIS, ROUTINE W REFLEX MICROSCOPIC
Glucose, UA: NEGATIVE mg/dL
Leukocytes, UA: NEGATIVE
Specific Gravity, Urine: 1.016 (ref 1.005–1.030)
pH: 6 (ref 5.0–8.0)

## 2011-04-17 LAB — DIFFERENTIAL
Basophils Absolute: 0 10*3/uL (ref 0.0–0.1)
Eosinophils Absolute: 0.3 10*3/uL (ref 0.0–0.7)
Eosinophils Relative: 4 % (ref 0–5)
Lymphocytes Relative: 34 % (ref 12–46)
Monocytes Absolute: 0.7 10*3/uL (ref 0.1–1.0)

## 2011-04-17 LAB — CBC
HCT: 39.3 % (ref 36.0–46.0)
MCHC: 33.8 g/dL (ref 30.0–36.0)
MCV: 89.5 fL (ref 78.0–100.0)
Platelets: 339 10*3/uL (ref 150–400)
RDW: 12.1 % (ref 11.5–15.5)

## 2011-04-22 ENCOUNTER — Telehealth: Payer: Self-pay | Admitting: Gastroenterology

## 2011-04-22 NOTE — Telephone Encounter (Signed)
Advised pt that labs from ER are in the epic system and all are normal. She will keep appt on 04/24/2011

## 2011-04-24 ENCOUNTER — Other Ambulatory Visit: Payer: Self-pay

## 2011-04-24 ENCOUNTER — Encounter: Payer: Self-pay | Admitting: Gastroenterology

## 2011-04-24 ENCOUNTER — Telehealth: Payer: Self-pay | Admitting: Gastroenterology

## 2011-04-24 ENCOUNTER — Ambulatory Visit (INDEPENDENT_AMBULATORY_CARE_PROVIDER_SITE_OTHER): Payer: Self-pay | Admitting: Gastroenterology

## 2011-04-24 VITALS — BP 122/76 | HR 84 | Ht 66.0 in | Wt 154.8 lb

## 2011-04-24 DIAGNOSIS — R197 Diarrhea, unspecified: Secondary | ICD-10-CM

## 2011-04-24 DIAGNOSIS — K529 Noninfective gastroenteritis and colitis, unspecified: Secondary | ICD-10-CM

## 2011-04-24 DIAGNOSIS — A0472 Enterocolitis due to Clostridium difficile, not specified as recurrent: Secondary | ICD-10-CM

## 2011-04-24 DIAGNOSIS — K589 Irritable bowel syndrome without diarrhea: Secondary | ICD-10-CM

## 2011-04-24 MED ORDER — ALOSETRON HCL 0.5 MG PO TABS: 0.5000 mg | ORAL_TABLET | Freq: Two times a day (BID) | ORAL | Status: DC

## 2011-04-24 NOTE — Telephone Encounter (Signed)
Patient states that she can not afford the Lotronex she has no insurance and its about $1000. She wants to know if there is another drug she can have in place of it for her IBS. I have advised her to other drug like it but I will ask Dr Jarold Motto and call her back tomorrow.

## 2011-04-24 NOTE — Progress Notes (Signed)
This is a chronically ill 60 year old Caucasian female with over one year of refractory diarrhea, gas, bloating, and general malaise with multiple perianal problems and occasional fecal incontinency had urinary incontinency. Colonoscopy with random biopsies and ileal biopsies have been unremarkable. She recently was found to have C. difficile and was treated with tapering doses of vancomycin over one month with only mild improvement. CT scan of the abdomen from June was reviewed and was unremarkable. Her were benign ovarian cysts noted. Patient also has chronic acid reflux confirmed by endoscopy, manometry, and 24-hour pH probe testing. Her reflexes only partially controlled with Nexium 40 mg every morning. Patient does have chronic pain and is on when necessary hydrocodone and continues with diarrhea. Malabsorption workup otherwise has been negative.  Current Medications, Allergies, Past Medical History, Past Surgical History, Family History and Social History were reviewed in [employer] record.  Pertinent Review of Systems Negative.Marland Kitchen general malaise is a prominent complaint. She also has such severe explosive diarrhea that she is afraid to leave the house. She has chronic anxiety syndrome and is on Xanax 2 mg 3 times a day, Norvasc for hypertension, and a trial of probiotics has not been useful.   Physical Exam: Awake alert no acute distress but somewhat agitated patient who also appears depressed. There are no stigmata of chronic liver disease, or thyromegaly. Chest is clear cardiac exam shows her to be in a regular rhythm without murmurs gallops or rubs. Her abdomen shows continued distention without definite organomegaly, masses or tenderness. Rectal exam shows no definite fissure or fistula but very poor anal squeeze pressure. Stool is loose but is guaiac negative and does not smell of C. 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 difficile infection which hopefully has cleared. We will repeat her stool C. difficile toxin by PCR, and if this is negative, start Lotronex 0.5 mg twice a day as tolerated. We will have her discontinue all antacids but continue her Nexium for her severe GERD. She may need referral to a tertiary Medical Center should she not improve. I've explained the risk and benefits of Lotronex to her, and she is on the appropriate release papers. I will see her back in 2 weeks' time for followup.  Please copy her primary care physician, referring physician, and pertinent subspecialists. Encounter Diagnoses  Name Primary?  . C. difficile colitis Yes  . IBS (irritable bowel syndrome)   . Chronic diarrhea

## 2011-04-24 NOTE — Patient Instructions (Signed)
Today you have read and signed the handout on Lotronex and a rx has been given to you in the office. Please go to the basement today for your labs.

## 2011-04-25 LAB — CLOSTRIDIUM DIFFICILE BY PCR: Toxigenic C. Difficile by PCR: NOT DETECTED

## 2011-04-25 NOTE — Telephone Encounter (Signed)
Advised pt that there is no alt, she states that she will try to get some assistance or money

## 2011-04-25 NOTE — Telephone Encounter (Signed)
no

## 2011-05-08 ENCOUNTER — Ambulatory Visit (INDEPENDENT_AMBULATORY_CARE_PROVIDER_SITE_OTHER): Payer: Self-pay | Admitting: Gastroenterology

## 2011-05-08 ENCOUNTER — Encounter: Payer: Self-pay | Admitting: Gastroenterology

## 2011-05-08 VITALS — BP 120/72 | HR 76 | Ht 66.0 in | Wt 155.2 lb

## 2011-05-08 DIAGNOSIS — A0472 Enterocolitis due to Clostridium difficile, not specified as recurrent: Secondary | ICD-10-CM

## 2011-05-08 DIAGNOSIS — K589 Irritable bowel syndrome without diarrhea: Secondary | ICD-10-CM

## 2011-05-08 MED ORDER — CILIDINIUM-CHLORDIAZEPOXIDE 2.5-5 MG PO CAPS: 1.0000 | ORAL_CAPSULE | Freq: Three times a day (TID) | ORAL | Status: DC | PRN

## 2011-05-08 NOTE — Progress Notes (Signed)
History of Present Illness: This is a 60-year-old Caucasian female with IBS and recent C. difficile infection treated one month of tapering vancomycin. She currently is having some diarrhea gas and bloating. She denies systemic complaints, hematochezia, nausea vomiting, or upper GI complaints. She also has failed to respond in the past to probiotics. The patient self discontinued Nexium which seems to also help with her symptomatology. She has noticed relief for her abdominal spasms with her husbands by mouth Flexeril.    Current Medications, Allergies, Past Medical History, Past Surgical History, Family History and Social History were reviewed in [employer] record.   Assessment and plan: Rule out relapsing C. difficile infection. Stool for C. difficile toxin by PCR ordered. Trial of Librax every 6-8 hours when necessary also suggested. I have asked her to avoid antacids with magnesium. He will call  in 1 week's time for progress report Encounter Diagnosis  Name Primary?  . C. difficile diarrhea Yes

## 2011-05-08 NOTE — Patient Instructions (Signed)
Your prescription(s) have been sent to you pharmacy.  Please go to the basement today for your labs.   

## 2011-05-09 DIAGNOSIS — N8500 Endometrial hyperplasia, unspecified: Secondary | ICD-10-CM | POA: Insufficient documentation

## 2011-05-09 DIAGNOSIS — K5790 Diverticulosis of intestine, part unspecified, without perforation or abscess without bleeding: Secondary | ICD-10-CM | POA: Insufficient documentation

## 2011-05-12 ENCOUNTER — Other Ambulatory Visit: Payer: Self-pay

## 2011-05-12 DIAGNOSIS — A0472 Enterocolitis due to Clostridium difficile, not specified as recurrent: Secondary | ICD-10-CM

## 2011-05-13 ENCOUNTER — Telehealth: Payer: Self-pay | Admitting: *Deleted

## 2011-05-13 ENCOUNTER — Encounter: Payer: Self-pay | Admitting: *Deleted

## 2011-05-13 LAB — POCT I-STAT, CHEM 8
Glucose, Bld: 104 — ABNORMAL HIGH
HCT: 40
Hemoglobin: 13.6
Potassium: 3.8
Sodium: 140

## 2011-05-13 LAB — CLOSTRIDIUM DIFFICILE BY PCR: Toxigenic C. Difficile by PCR: DETECTED — CR

## 2011-05-13 NOTE — Telephone Encounter (Signed)
Message copied by Florene Glen on Tue May 13, 2011  4:22 PM ------      Message from: PATTERSON, DAVID R      Created: Tue May 13, 2011  3:58 PM       She will have to restart vancomycin 125 mg 3 times a day for one month then see me in followup.

## 2011-05-13 NOTE — Telephone Encounter (Signed)
Notified pt she was positive for CDIFF again. She needs to start back on Vanc and f/u with Dr Jarold Motto in 1 month. Called Vanc to [facility] for compounding d/t the cost. Will mail pt an appt; pt stated understanding.

## 2011-05-15 ENCOUNTER — Telehealth: Payer: Self-pay | Admitting: Gastroenterology

## 2011-05-15 NOTE — Telephone Encounter (Signed)
Pt called in today to report she doesn't feel as though she has CDIFF. Yesterday she had her 1st formed stool in months and remains free of diarrhea. She wonders if they mixed up her sample since she never heard the results of the sample brought in on 04/24/11. Also, how did we did the results so fast? Explained the CDIFF was negative on 04/24/11 and the sample from 05/12/11 may have grown more bacteria faster and the dx was obvious. Encouraged pt to complete the Vanc and she agreed. She also wanted the results of the ESO. Mano. and 24 hr PH Probe. According to your documentation, the Esophageal Manometry was normal but the PH study stated:   Assessment: This 24-hour pH probe study shows proximal and distal acid reflux in both upright and recumbent position. This patient may need fundoplication surgery to control her severe acid reflux. Esophageal manometry was also completed which is essentially normal. Please advise.

## 2011-05-15 NOTE — Telephone Encounter (Signed)
lmom for pt that I will call her back tomorrow to address the PH study. Our phones and power have been off if she tries to call back, but decrease the Vanc to QOD dosing x 2 weeks then stop.  Dr Jarold Motto, what about the results of the [facility] Study? Thanks.  Assessment: This 24-hour pH probe study shows proximal and distal acid reflux in both upright and recumbent position. This patient may need fundoplication surgery to control her severe acid reflux. Esophageal manometry was also completed which is essentially normal.  Please advise.

## 2011-05-15 NOTE — Telephone Encounter (Signed)
Have her do qod dosing for 2 weeks,then stop

## 2011-05-16 ENCOUNTER — Telehealth: Payer: Self-pay | Admitting: *Deleted

## 2011-05-16 ENCOUNTER — Ambulatory Visit (INDEPENDENT_AMBULATORY_CARE_PROVIDER_SITE_OTHER): Payer: Self-pay | Admitting: Gynecology

## 2011-05-16 ENCOUNTER — Other Ambulatory Visit (HOSPITAL_COMMUNITY)
Admission: RE | Admit: 2011-05-16 | Discharge: 2011-05-16 | Disposition: A | Discharge status: Home / Self Care | Payer: Self-pay | Source: Ambulatory Visit | Attending: Gynecology | Admitting: Gynecology

## 2011-05-16 ENCOUNTER — Encounter: Payer: Self-pay | Admitting: Gynecology

## 2011-05-16 VITALS — BP 120/76 | Ht 65.0 in | Wt 156.0 lb

## 2011-05-16 DIAGNOSIS — Z01419 Encounter for gynecological examination (general) (routine) without abnormal findings: Secondary | ICD-10-CM

## 2011-05-16 DIAGNOSIS — M8580 Other specified disorders of bone density and structure, unspecified site: Secondary | ICD-10-CM

## 2011-05-16 DIAGNOSIS — N814 Uterovaginal prolapse, unspecified: Secondary | ICD-10-CM

## 2011-05-16 DIAGNOSIS — M899 Disorder of bone, unspecified: Secondary | ICD-10-CM

## 2011-05-16 DIAGNOSIS — M949 Disorder of cartilage, unspecified: Secondary | ICD-10-CM

## 2011-05-16 DIAGNOSIS — N8111 Cystocele, midline: Secondary | ICD-10-CM

## 2011-05-16 DIAGNOSIS — N816 Rectocele: Secondary | ICD-10-CM

## 2011-05-16 NOTE — Telephone Encounter (Signed)
Spoke with pt;  we will see her husband on 05/27/11 at 3pm.

## 2011-05-16 NOTE — Telephone Encounter (Signed)
Message copied by Florene Glen on Fri May 16, 2011 10:24 AM ------      Message from: Jarold Motto, DAVID R      Created: Fri May 16, 2011 10:06 AM      Regarding: RE: CDIFF       Very contagous  ,would need ov of course.Marland Kitchen??      ----- Message -----         From: Linna Hoff, RN         Sent: 05/15/2011   2:39 PM           To: Sheryn Bison, MD      Subject: CDIFF                                                    Dr Jarold Motto, pt stated her husband has had diarrhea for weeks and she wonders if they are passing it back and forth. He called his PCP , Dr Tawanna Cooler, and he would not do the test stating CDIFF is NOT CONTAGIOUS. He suggested he come here for the test and we can do it because we are all part of [place]. Pt is not established here and does not really want a referral d/t finances at this time.      Can we do the CDIFF if they are not established? Please advise. Thanks.

## 2011-05-16 NOTE — Progress Notes (Signed)
Megan [name] 02/26/1951 [id number]        60 y.o.  for annual exam.  Having a lot of GI issues being followed by Dr. Jarold Motto. I saw her last year she was going to start HRT due to a flushes and sweats. She said she transiently tried it but didn't like it and notes her hot flushes and sweats have pretty much gone away.  Past medical history,surgical history, medications, allergies, family history and social history were all reviewed and documented in the EPIC chart. ROS:  Was performed and pertinent positives and negatives are included in the history.  Exam: chaperone present Filed Vitals:   05/16/11 0909  BP: 120/76   General appearance  Normal Skin grossly normal Head/Neck normal with no cervical or supraclavicular adenopathy thyroid normal Lungs  clear Cardiac RR, without RMG Abdominal  soft, nontender, without masses, organomegaly or hernia Breasts  examined lying and sitting without masses, retractions, discharge or axillary adenopathy. Pelvic  Ext/BUS/vagina  normal First to second degree cystocele  Cervix  normal  Pap done  Uterus  axial, normal size, shape and contour, midline and mobile nontender first to second degree uterine prolapse  Adnexa  Without masses or tenderness    Anus and perineum  normal   Rectovaginal  normal sphincter tone without palpated masses or tenderness. First to second degree rectocele   Assessment/Plan:  60 y.o. female for annual exam.    1. Pelvic relaxation. Patient has first second degree cystocele, and uterine prolapse and rectocele. She is having issues with stress incontinence with loss of urine with coughing sneezing laughing. She's having a lot of GI issues now does not want to have surgery but she is considering having a sling by the urologist. I recommended that if indeed she does this we may want to consider doing hysterectomy and posterior repair. She'll follow up with me if she planned scheduled surgery otherwise she is asymptomatic  from her prolapse and she'll monitor present. 2. Osteopenia. She is 2 years from her bone density and question schedule a DEXA now and followup. 3. Health maintenance. Of note in her problem list hypothyroidism listed and she never knew this was an issue. She is not on thyroid replacement. I've asked her call Dr. Charmian Muff office and asked him about this and she agrees to do that. Self breast exams on a monthly basis discussed and urged. She is 2 years from her last mammogram of strong recommended her to call and schedule this and she agrees to do so. She'll continue followup with Dr. Jarold Motto in reference to her GI health. No blood work was done today was all done through her primary. I did remind her to ask them to do a vitamin D level to make sure she is therapeutic.  Assuming she continues well from a gynecologic standpoint she'll see me in a year sooner as needed.    [name] MD, 9:42 AM 05/16/2011

## 2011-05-19 ENCOUNTER — Telehealth: Payer: Self-pay | Admitting: *Deleted

## 2011-05-19 ENCOUNTER — Telehealth: Payer: Self-pay | Admitting: Gastroenterology

## 2011-05-19 NOTE — Telephone Encounter (Signed)
Pt was told by Dr. Audie Box that Dr. Charmian Muff records stated she has under active thyroid.  Please call to discuss this with her as she didn't know anything about it.  He also suggested she have some vitamin levels checked.

## 2011-05-19 NOTE — Telephone Encounter (Signed)
Left chart notes with dx at the front desk for pt.

## 2011-05-27 ENCOUNTER — Ambulatory Visit (INDEPENDENT_AMBULATORY_CARE_PROVIDER_SITE_OTHER): Payer: Self-pay

## 2011-05-27 DIAGNOSIS — M810 Age-related osteoporosis without current pathological fracture: Secondary | ICD-10-CM

## 2011-05-27 DIAGNOSIS — M8580 Other specified disorders of bone density and structure, unspecified site: Secondary | ICD-10-CM

## 2011-05-27 NOTE — Telephone Encounter (Signed)
Pt had called on 05/19/11 to req to get order to have labs drawn to check pts Vit B-12 and Vit D lvs checked per request by Dr Audie Box. Pt has not gotten a response and needs this done asap. Pls order labs and notify pt when to sch.

## 2011-05-29 ENCOUNTER — Telehealth: Payer: Self-pay | Admitting: *Deleted

## 2011-05-29 NOTE — Telephone Encounter (Signed)
Pt informed with the below note, pt will make appointment to get lab drawn.

## 2011-05-29 NOTE — Telephone Encounter (Signed)
Per office note 05/16/11 pt was to have PCP get a vit. D. Level drawn. Her PCP cannot see her anytime soon, so pt wants to know she could come by office and have level drawn? Please advise

## 2011-05-29 NOTE — Telephone Encounter (Signed)
Okay, order placed.

## 2011-06-02 ENCOUNTER — Telehealth: Payer: Self-pay | Admitting: Gynecology

## 2011-06-02 NOTE — Telephone Encounter (Signed)
Tell patient bone density shows osteoporosis. I want to check some baseline labs to include vitamin D, TSH, PTH. She recently had a comprehensive metabolic panel through another doctor's office which I do not need to repeat. I put orders in for the labs ask to have these done and then make an office appointment to talk to me about her bone density and her treatment options.

## 2011-06-02 NOTE — Telephone Encounter (Signed)
Pt informed with the below note, pt will make appointment for labs.

## 2011-06-03 ENCOUNTER — Other Ambulatory Visit (INDEPENDENT_AMBULATORY_CARE_PROVIDER_SITE_OTHER): Payer: Self-pay | Admitting: *Deleted

## 2011-06-03 DIAGNOSIS — M810 Age-related osteoporosis without current pathological fracture: Secondary | ICD-10-CM

## 2011-06-03 DIAGNOSIS — R635 Abnormal weight gain: Secondary | ICD-10-CM

## 2011-06-05 ENCOUNTER — Other Ambulatory Visit: Payer: Self-pay | Admitting: Gynecology

## 2011-06-05 ENCOUNTER — Telehealth: Payer: Self-pay | Admitting: *Deleted

## 2011-06-05 DIAGNOSIS — Z1231 Encounter for screening mammogram for malignant neoplasm of breast: Secondary | ICD-10-CM

## 2011-06-05 NOTE — Telephone Encounter (Signed)
Pt called wanting order sent for a diag. Mammogram due to left breast pain. I told pt she would need office visit if she was having any breast pain. Pt states she has no money now and will see how the pain does with a heat pad placed on it. I told pt to call appointment desk when she is ready to schedule appointment, pt agreed with this.

## 2011-06-12 ENCOUNTER — Other Ambulatory Visit: Payer: Self-pay

## 2011-06-12 ENCOUNTER — Ambulatory Visit (INDEPENDENT_AMBULATORY_CARE_PROVIDER_SITE_OTHER): Payer: Self-pay | Admitting: Gynecology

## 2011-06-12 ENCOUNTER — Encounter: Payer: Self-pay | Admitting: Gynecology

## 2011-06-12 DIAGNOSIS — M810 Age-related osteoporosis without current pathological fracture: Secondary | ICD-10-CM

## 2011-06-12 MED ORDER — LORAZEPAM 2 MG PO TABS: 2.0000 mg | ORAL_TABLET | Freq: Three times a day (TID) | ORAL | Status: DC | PRN

## 2011-06-12 NOTE — Telephone Encounter (Signed)
May refill for #90 (one month only) and she will need to establish with another provider.

## 2011-06-12 NOTE — Telephone Encounter (Signed)
Pts spouse called back re: status of lorazepam refill. Pt req that this be called in to [facility] on [address] today.

## 2011-06-12 NOTE — Progress Notes (Signed)
Patient presents to discuss her recent DEXA study which shows osteoporosis -2.5 at the left femoral neck. In comparison to her prior study in 2010 there is a statistically significant decline in the right hip of 9.4% in the left hip of 4.1% her AP spine is stable. I reviewed these findings with her my recommendation that we should consider treatment. I reviewed the various treatment options to include oral bisphosphonates, IV bisphosphate, Prolia, Evista, HRT, Forteo and calcitonin. She is having a lot of issues with her gastrointestinal tract includes a large hiatal hernia and I don't think that oral bisphosphonates would be a good choice. After lengthy discussion I think IV Reclast would be a good choice. I discussed was involved with the administration as well as the risks to include atypical fractures osteonecrosis of the jaw. We'll go ahead and pre-certify her for this and move towards administration she knows to follow up for these arrangements. I did do a workup for secondary causes to include a TSH, PTH, vitamin D her vitamin D level was low at 26 and she has increased her vitamin D intake to 2000 units daily.

## 2011-06-12 NOTE — Telephone Encounter (Signed)
rx request for lorazepam 2 mg #120. Pls advise.  Pt last seen 9.28/12.

## 2011-06-12 NOTE — Telephone Encounter (Signed)
rx called in to pharmacy.  Pt is aware. Pt states she would like to see Dr. Caryl Never.

## 2011-06-17 ENCOUNTER — Encounter: Payer: Self-pay | Admitting: Gastroenterology

## 2011-06-17 ENCOUNTER — Ambulatory Visit (INDEPENDENT_AMBULATORY_CARE_PROVIDER_SITE_OTHER): Payer: Self-pay | Admitting: Gastroenterology

## 2011-06-17 DIAGNOSIS — K219 Gastro-esophageal reflux disease without esophagitis: Secondary | ICD-10-CM

## 2011-06-17 DIAGNOSIS — K589 Irritable bowel syndrome without diarrhea: Secondary | ICD-10-CM

## 2011-06-17 DIAGNOSIS — A0472 Enterocolitis due to Clostridium difficile, not specified as recurrent: Secondary | ICD-10-CM

## 2011-06-17 DIAGNOSIS — K2970 Gastritis, unspecified, without bleeding: Secondary | ICD-10-CM | POA: Insufficient documentation

## 2011-06-17 DIAGNOSIS — R109 Unspecified abdominal pain: Secondary | ICD-10-CM

## 2011-06-17 MED ORDER — HYOSCYAMINE SULFATE 0.125 MG SL SUBL: 0.1250 mg | SUBLINGUAL_TABLET | Freq: Four times a day (QID) | SUBLINGUAL | Status: DC | PRN

## 2011-06-17 NOTE — Progress Notes (Signed)
This is a very complicated 60 year old Caucasian female with chronic IBS and recent C. difficile colitis treated with vancomycin. She currently denies lower gastrointestinal problems but complains of intermittent severe spasmodic pain in the subxiphoid area exacerbated by bending and lifting without nausea vomiting or any specific hepatobiliary complaints. She is on daily Nexium for chronic GERD confirmed by 24-hour pH probe testing and manometry. Gastric imaging scan was normal.  Current Medications, Allergies, Past Medical History, Past Surgical History, Family History and Social History were reviewed in [employer] record.   Physical Exam: Awake alert no acute distress. Chest is clear and abdominal exam is unremarkable. She and her regular rhythm without murmurs gallops or rubs. There is no evidence of abdominal wall hernia or other abnormalities. Bowel sounds are normal. Mental status is normal.    Assessment and Plan: A very difficult patient with many functional complaints. I have scheduled a gallbladder ultrasound to exclude cholelithiasis, increased her Nexium to twice a day, and have prescribed when necessary sublingual Levsin. I think she is a poor candidate for fundoplication surgery. We will check followup C. difficile toxin in her stool. I'm beginning to feel that psychiatric evaluation is in order. Encounter Diagnoses  Name Primary?  . Abdominal pain Yes  . GERD (gastroesophageal reflux disease)   . Clostridium difficile enterocolitis   . IBS (irritable bowel syndrome)

## 2011-06-17 NOTE — Patient Instructions (Signed)
Your Abdominal U/S is scheduled on 06/23/2011 at 11am to arrive at 10:45am at [facility] We are giving you Nexium samples and use Levsin as needed twice daily Follow up in 3 weeks

## 2011-06-23 ENCOUNTER — Ambulatory Visit (HOSPITAL_COMMUNITY)
Admission: RE | Admit: 2011-06-23 | Discharge: 2011-06-23 | Disposition: A | Discharge status: Home / Self Care | Payer: Self-pay | Source: Ambulatory Visit | Attending: Gastroenterology | Admitting: Gastroenterology

## 2011-06-23 DIAGNOSIS — K7689 Other specified diseases of liver: Secondary | ICD-10-CM | POA: Insufficient documentation

## 2011-06-23 DIAGNOSIS — R109 Unspecified abdominal pain: Secondary | ICD-10-CM | POA: Insufficient documentation

## 2011-06-24 ENCOUNTER — Ambulatory Visit (INDEPENDENT_AMBULATORY_CARE_PROVIDER_SITE_OTHER): Payer: Self-pay | Admitting: Urology

## 2011-06-24 ENCOUNTER — Ambulatory Visit (HOSPITAL_COMMUNITY): Payer: Self-pay

## 2011-06-24 DIAGNOSIS — N3946 Mixed incontinence: Secondary | ICD-10-CM

## 2011-06-24 DIAGNOSIS — N8111 Cystocele, midline: Secondary | ICD-10-CM

## 2011-06-24 DIAGNOSIS — R350 Frequency of micturition: Secondary | ICD-10-CM

## 2011-06-24 DIAGNOSIS — N3289 Other specified disorders of bladder: Secondary | ICD-10-CM

## 2011-06-24 DIAGNOSIS — R3915 Urgency of urination: Secondary | ICD-10-CM

## 2011-06-25 ENCOUNTER — Telehealth: Payer: Self-pay | Admitting: *Deleted

## 2011-06-25 MED ORDER — CILIDINIUM-CHLORDIAZEPOXIDE 2.5-5 MG PO CAPS: ORAL_CAPSULE | ORAL | Status: DC

## 2011-06-25 NOTE — Telephone Encounter (Signed)
Notified pt that Dr Jarold Motto stated she has Functional GI problems and needs Librax prn; pt requests I order med from [facility] on [address]- done.

## 2011-06-25 NOTE — Telephone Encounter (Signed)
Gave pt results of her Abd. U/S which was basically normal. She reports she is in constant pain under her ribs on the left side near her stomach. If she bends over, lifts anything or reaches " it catches and hurts". She states she knows it's her HH because some it juts out and then catches until it relaxes and goes down again. Pt reports she stays upset as do her intestines and stomach. Pt reports she has been on Nexium for years and worries about the side effects from it. I asked if she's ever tried Dexilant and she stated no. Dr Jarold Motto, any advice or can pt try Dexilant samples? Thanks.

## 2011-06-25 NOTE — Telephone Encounter (Signed)
FUNCTIONAL GI PROBLEM.Marland KitchenPRN LIBRAX.Marland Kitchen

## 2011-06-26 LAB — HM MAMMOGRAPHY

## 2011-06-27 ENCOUNTER — Ambulatory Visit (HOSPITAL_COMMUNITY)
Admission: RE | Admit: 2011-06-27 | Discharge: 2011-06-27 | Disposition: A | Discharge status: Home / Self Care | Payer: Self-pay | Source: Ambulatory Visit | Attending: Gynecology | Admitting: Gynecology

## 2011-06-27 ENCOUNTER — Other Ambulatory Visit: Payer: Self-pay | Admitting: *Deleted

## 2011-06-27 DIAGNOSIS — M810 Age-related osteoporosis without current pathological fracture: Secondary | ICD-10-CM

## 2011-06-27 DIAGNOSIS — Z1231 Encounter for screening mammogram for malignant neoplasm of breast: Secondary | ICD-10-CM | POA: Insufficient documentation

## 2011-06-30 ENCOUNTER — Ambulatory Visit (INDEPENDENT_AMBULATORY_CARE_PROVIDER_SITE_OTHER): Payer: Self-pay | Admitting: Family Medicine

## 2011-06-30 ENCOUNTER — Encounter: Payer: Self-pay | Admitting: Family Medicine

## 2011-06-30 DIAGNOSIS — R0789 Other chest pain: Secondary | ICD-10-CM

## 2011-06-30 DIAGNOSIS — K219 Gastro-esophageal reflux disease without esophagitis: Secondary | ICD-10-CM

## 2011-06-30 DIAGNOSIS — Z0000 Encounter for general adult medical examination without abnormal findings: Secondary | ICD-10-CM

## 2011-06-30 DIAGNOSIS — R011 Cardiac murmur, unspecified: Secondary | ICD-10-CM

## 2011-06-30 DIAGNOSIS — F419 Anxiety disorder, unspecified: Secondary | ICD-10-CM

## 2011-06-30 DIAGNOSIS — A0472 Enterocolitis due to Clostridium difficile, not specified as recurrent: Secondary | ICD-10-CM

## 2011-06-30 DIAGNOSIS — R079 Chest pain, unspecified: Secondary | ICD-10-CM

## 2011-06-30 DIAGNOSIS — F411 Generalized anxiety disorder: Secondary | ICD-10-CM

## 2011-06-30 DIAGNOSIS — R3915 Urgency of urination: Secondary | ICD-10-CM

## 2011-06-30 DIAGNOSIS — R1013 Epigastric pain: Secondary | ICD-10-CM

## 2011-06-30 DIAGNOSIS — K449 Diaphragmatic hernia without obstruction or gangrene: Secondary | ICD-10-CM

## 2011-06-30 DIAGNOSIS — I10 Essential (primary) hypertension: Secondary | ICD-10-CM

## 2011-06-30 LAB — CBC
Hemoglobin: 13.6 g/dL (ref 12.0–15.0)
RBC: 4.52 MIL/uL (ref 3.87–5.11)

## 2011-06-30 MED ORDER — SUCRALFATE 1 GM/10ML PO SUSP: 1.0000 g | Freq: Four times a day (QID) | ORAL | Status: DC

## 2011-06-30 MED ORDER — HYOSCYAMINE SULFATE 0.125 MG SL SUBL: 0.1250 mg | SUBLINGUAL_TABLET | SUBLINGUAL | Status: DC | PRN

## 2011-06-30 MED ORDER — LORAZEPAM 2 MG PO TABS: 2.0000 mg | ORAL_TABLET | Freq: Three times a day (TID) | ORAL | Status: DC | PRN

## 2011-06-30 MED ORDER — RANITIDINE HCL 300 MG PO TABS: 300.0000 mg | ORAL_TABLET | Freq: Every day | ORAL | Status: DC

## 2011-06-30 NOTE — Patient Instructions (Signed)
Heart Murmur A heart murmur is an extra sound heard by your caregiver when listening to your heart with a device called a stethoscope. The sound might be a "hum" or "whoosh" sound heard when the heart beats. The sound comes from turbulence when blood flows through the heart. There are two types of heart murmurs:  Innocent (Harmless) murmurs: Most people with this type of heart murmur do not have signs or symptoms of heart problems. Many children have innocent heart murmurs. When an innocent heart murmur is found, there is no need to get tests or do treatment. Also, there is no need to restrict activities or stop playing sports. Innocent heart murmurs may be caused by many things. For example, it might be caused by a tiny hole or defect in the wall of the heart. These defects often close as a child grows. An innocent heart murmur may be heard by an examining clinician throughout your life. If you see a new caregiver, please let him or her know this was found during past exams.   Abnormal murmurs: May have signs and symptoms of heart problems. These types of murmurs can occur in children and adults. In children, abnormal heart murmurs are typically caused from heart defects that are present at birth. In adults, abnormal murmurs are usually from heart valve problems caused by disease, infection, or aging.  SYMPTOMS   Innocent (Harmless) murmurs do not cause symptoms or require you to limit physical activity.   Many people with abnormal murmurs may or may not have symptoms. If symptoms do develop, they might include:   Shortness of breath.   Blue coloring of the skin, especially on the fingertips.   Chest pain.   Palpitations or feeling a "fluttering" or a "skipped" heart beat.   Fainting.   Persistent cough.   Getting tired much faster than expected.  DIAGNOSIS  A heart murmur might be heard during a pre-sports physical or during any type of examination. When a murmur is heard, it may suggest  a possible problem. When this happens, your caregiver may ask you to see a heart specialist (cardiologist). You may also be asked to undergo one or more heart tests. In these cases, testing may vary depending upon what your caregiver heard. Tests for a heart murmur might include one or more of the following:  EKG (electrocardiogram).   Echocardiogram.   Cardiac MRI.  For children and adults who have an abnormal heart murmur and want to play sports, it is important to complete testing, review test results, and receive recommendations from your caregiver. If heart disease is present, it may be risky to play. Finding out the results of your test Not all test results are available during your visit. If your test results are not back during the visit, make an appointment with your caregiver to find out the results. Do not assume everything is normal if you have not heard from your caregiver or the medical facility. It is important for you to follow up on all of your test results.  TREATMENT  As noted above, innocent (harmless) murmurs require no treatment or activity restriction. If the murmur represents a problem with the heart, treatment will depend upon the exact nature of the problem. In these cases, medicine or surgery may be needed to treat the problem. HOME CARE INSTRUCTIONS If you want to participate in sports or other types of strenuous physical activity, it is important to discuss this first with your caregiver. If the murmur represents a  problem with the heart and you choose to participate in sports, there is a small chance that a serious problem (including sudden death) could result.  SEEK MEDICAL CARE IF:   You feel that your symptoms are slowly worsening.   You develop any new symptoms that cause concern.   You feel that you are having side effects from any medicines prescribed.  SEEK IMMEDIATE MEDICAL CARE IF:   Chest pain develops.   You are short of breath.   You notice that  your heart beats irregularly often enough to cause you to worry.   You have fainting spells.   There is a worsening of any problems that brought you or your child in for medical care.  Document Released: 09/11/2004 Document Revised: 04/16/2011 Document Reviewed: 10/12/2007 [facility] Patient Information 2012 [place], [place].

## 2011-07-01 LAB — LIPID PANEL
Total CHOL/HDL Ratio: 4
Triglycerides: 80 mg/dL (ref 0.0–149.0)

## 2011-07-01 LAB — HEPATIC FUNCTION PANEL
ALT: 19 U/L (ref 0–35)
AST: 21 U/L (ref 0–37)
Alkaline Phosphatase: 100 U/L (ref 39–117)
Bilirubin, Direct: 0 mg/dL (ref 0.0–0.3)
Total Bilirubin: 0.7 mg/dL (ref 0.3–1.2)

## 2011-07-01 LAB — RENAL FUNCTION PANEL
CO2: 27 mEq/L (ref 19–32)
Calcium: 8.7 mg/dL (ref 8.4–10.5)
Chloride: 107 mEq/L (ref 96–112)
Potassium: 3.7 mEq/L (ref 3.5–5.1)
Sodium: 141 mEq/L (ref 135–145)

## 2011-07-02 ENCOUNTER — Telehealth: Payer: Self-pay

## 2011-07-02 LAB — CLOSTRIDIUM DIFFICILE BY PCR: Toxigenic C. Difficile by PCR: DETECTED — CR

## 2011-07-02 MED ORDER — LACTINEX PO CHEW: 2.0000 | CHEWABLE_TABLET | Freq: Three times a day (TID) | ORAL | Status: DC

## 2011-07-02 MED ORDER — VANCOMYCIN HCL 125 MG PO CAPS: 125.0000 mg | ORAL_CAPSULE | Freq: Four times a day (QID) | ORAL | Status: DC

## 2011-07-02 NOTE — Telephone Encounter (Signed)
Left a message for pt to return my call. RX also sent into pharmacy for CDIFF

## 2011-07-02 NOTE — Progress Notes (Signed)
Left a message for patient to return my call. I rx'd medication to pharmacy

## 2011-07-03 ENCOUNTER — Telehealth: Payer: Self-pay | Admitting: Gastroenterology

## 2011-07-03 ENCOUNTER — Telehealth: Payer: Self-pay | Admitting: Family Medicine

## 2011-07-03 ENCOUNTER — Encounter (INDEPENDENT_AMBULATORY_CARE_PROVIDER_SITE_OTHER): Payer: Self-pay | Admitting: Internal Medicine

## 2011-07-03 MED ORDER — METRONIDAZOLE 500 MG PO TABS: 500.0000 mg | ORAL_TABLET | Freq: Three times a day (TID) | ORAL | Status: DC

## 2011-07-03 MED ORDER — LACTINEX PO CHEW: 2.0000 | CHEWABLE_TABLET | Freq: Three times a day (TID) | ORAL | Status: DC

## 2011-07-03 NOTE — Telephone Encounter (Signed)
Pt called back @ 1:57 to cancel this call because her primary care doctor gave her medication and she no longer needs our assistanace

## 2011-07-03 NOTE — Telephone Encounter (Signed)
Patient wants to know if she can take her antibiotic with the carafate. Please contact patient ASAP

## 2011-07-03 NOTE — Telephone Encounter (Signed)
Informed patient and sent in RX's

## 2011-07-03 NOTE — Telephone Encounter (Signed)
Call cancelled

## 2011-07-03 NOTE — Telephone Encounter (Signed)
Advised per Dr. Milinda Cave OK to continue carafate.  Take carafate one hour prior to flagyl.  Pt is agreeable.  Pt asks for name of pribiotic Dr. Abner Greenspan prescribed.  Name given.  Pt states that [facility] has told her they do not have that brand and any probiotic will work.  Advised pt that we would like for her to be on this probiotic.  Pt states she will call [facility] and let them know she wants this particular med.  Pt will call us if she has any problems.

## 2011-07-03 NOTE — Telephone Encounter (Signed)
So she really should use th Vanco, if she is not going to then I will start flagyl 500mg  po tid x 14 days but she will need to see GI ASAP to further evaluate, also start Lactinex 2 tabs po tid x 2 months and report any concerning symptoms

## 2011-07-03 NOTE — Telephone Encounter (Signed)
Left a message for patient to return my call. 

## 2011-07-03 NOTE — Telephone Encounter (Signed)
Patient informed and would like to know if there is anything else she can take because the antibiotic is $1800? Pt states she has tried the liquid compound and it "tears" her stomach up? Please advise?

## 2011-07-04 ENCOUNTER — Other Ambulatory Visit: Payer: Self-pay | Admitting: *Deleted

## 2011-07-04 MED ORDER — ZOLEDRONIC ACID 5 MG/100ML IV SOLN: 5.0000 mg | Freq: Once | INTRAVENOUS | Status: DC

## 2011-07-07 ENCOUNTER — Telehealth: Payer: Self-pay | Admitting: *Deleted

## 2011-07-07 NOTE — Telephone Encounter (Signed)
Lm for patient to call.  Need to give her appointment information for Reclast.  Set up at [facility] Short Stay on 07/18/11 @10am .

## 2011-07-07 NOTE — Telephone Encounter (Signed)
Message copied by Libby Maw on Mon Jul 07, 2011  2:08 PM ------      Message from: [name]      Created: Megan [name] Jun 27, 2011 12:55 PM       Okay for labs      ----- Message -----         From: Otto Herb, CNA         Sent: 06/27/2011  11:41 AM           To: [name], MD            Dr. Audie Box, is it ok to get labs done here now for Reclast?  Will need Calcium and Creatinine done.      ----- Message -----         From: [name], MD         Sent: 06/26/2011   9:24 AM           To: Otto Herb, CNA            I spoke with Megan [name] and she said that Reclast is non-issue as Investment banker, operational administers the medication at in off site location and that we do not get involve as far as the purchase is concerned so I will go ahead and arrange for this      ----- Message -----         From: Otto Herb, CNA         Sent: 06/17/2011  11:48 AM           To: [name], MD            Dr. Audie Box, this patient is on a sliding scale at [facility].  Per Megan [name] you would be paying the cost of it. :( May have to come up with another plan.      Megan [name]      ----- Message -----         From: [name], CMA         Sent: 06/12/2011   4:18 PM           To: Megan Alguire Duwaine Maxin, CNA                        ----- Message -----         From: [name], MD         Sent: 06/12/2011   3:14 PM           To: [name], CMA            We need to precertify and arrange for Reclast for this patient

## 2011-07-08 NOTE — Telephone Encounter (Signed)
Patient informed information below.  Mailed out instruction sheet.

## 2011-07-11 ENCOUNTER — Other Ambulatory Visit (HOSPITAL_COMMUNITY): Payer: Self-pay | Admitting: Radiology

## 2011-07-14 ENCOUNTER — Telehealth: Payer: Self-pay | Admitting: Family Medicine

## 2011-07-14 ENCOUNTER — Encounter: Payer: Self-pay | Admitting: Family Medicine

## 2011-07-14 ENCOUNTER — Ambulatory Visit: Payer: Self-pay | Admitting: Family Medicine

## 2011-07-14 DIAGNOSIS — R0789 Other chest pain: Secondary | ICD-10-CM

## 2011-07-14 HISTORY — DX: Other chest pain: R07.89

## 2011-07-14 NOTE — Assessment & Plan Note (Signed)
Likely related to Reflux but also a slight murmur noted and a sense of dyspnea when the pain occurs, will start with a 2 d Echo but proceed with further work up if symptoms worsen

## 2011-07-14 NOTE — Telephone Encounter (Signed)
With as much GI upset and atypical CP she is having I believe she should wait to do the Reclast until after she has been evaluated by GI because it could aggravate her symptoms

## 2011-07-14 NOTE — Progress Notes (Signed)
Megan [name] [id number] [birth year] 07/14/2011      Progress Note New Patient  Subjective  Chief Complaint  Chief Complaint  Patient presents with  . Establish Care    new patient- transfer    HPI  60 year old Caucasian female who is in today to establish care. She was previously with the practice of Dr. Scotty Court who is retired. Her major complaints are GI interesting lady. She is a long history of gastrointestinal disease with reflux, hiatal hernia also a history of irritable bowel syndrome C. difficile colitis diverticulitis. He is having increasing difficult chest pain with a burning sensation at times. Has some mild sense of dyspnea when these pains occur. Denies any significant palpitations, diaphoresis when these episodes occur. Does have some intermittent abdominal pain cramping as well. Not presently following with GI. Reports having a HIDA scan in the past which was normal as well as a pH probe which confirmed hiatal hernia and reflux. She sees Dr. Audie Box for her gynecology care. No other acute pelvis. Does have some urinary urgency. Denies dysuria or hematuria. No recent febrile illness, congestion.  Past Medical History  Diagnosis Date  . GERD (gastroesophageal reflux disease)   . Depression   . Anxiety   . Insomnia   . IBS (irritable bowel syndrome)   . Diverticulosis   . Barrett esophagus   . Hiatal hernia   . Unspecified menopausal and postmenopausal disorder   . Vitamin B12 deficiency   . Blind loop syndrome   . Hepatic cyst   . Unspecified hypothyroidism   . Diarrhea   . Family history of malignant neoplasm of gastrointestinal tract     multiple members  . Endometrial hyperplasia 02/27/2006    BENIGN ENDO BX ON 02/2007  . C. difficile diarrhea   . Osteoporosis 05/2011    -2.5 Lt femoral neck  . Cystocele, midline   . Rectocele   . Uterine prolapse without mention of vaginal wall prolapse   . Chest pain, atypical 07/14/2011    Past Surgical History   Procedure Date  . Appendectomy   . Uterine fibroid surgery   . Hysteroscopy     POLYP    Family History  Problem Relation Age of Onset  . Colon cancer Mother   . Diabetes Mother   . Cancer Mother     colon  . Hypertension Mother   . Colon polyps Father   . Colon cancer Maternal Aunt   . Colon cancer Maternal Grandmother   . Breast cancer Maternal Grandmother   . Diabetes Paternal Grandmother   . Breast cancer Paternal Grandmother   . Colon cancer Cousin     X3  . Ovarian cancer Cousin     History   Social History  . Marital Status: Married    Spouse Name: N/A    Number of Children: 4  . Years of Education: N/A   Occupational History  . Retired    Social History Main Topics  . Smoking status: Former Smoker    Quit date: 08/19/1995  . Smokeless tobacco: Never Used  . Alcohol Use: No  . Drug Use: No  . Sexually Active: No   Other Topics Concern  . Not on file   Social History Narrative  . No narrative on file    Current Outpatient Prescriptions on File Prior to Visit  Medication Sig Dispense Refill  . albuterol (PROVENTIL HFA;VENTOLIN HFA) 108 (90 BASE) MCG/ACT inhaler Inhale 2 puffs into the lungs every 6 (six) hours as  needed for wheezing.  18 g  0  . amLODipine (NORVASC) 5 MG tablet Take 5 mg by mouth daily.        . Calcium Carbonate (CALCIUM 600 PO) Take by mouth.        . Cholecalciferol (VITAMIN D) 2000 UNITS CAPS Take by mouth.        . clidinium-chlordiazePOXIDE (LIBRAX) 2.5-5 MG per capsule Take one capsule by mouth 3 times daily when needed for abdominal pain.  60 capsule  1  . esomeprazole (NEXIUM) 40 MG capsule Take 1 capsule (40 mg total) by mouth daily before breakfast.  90 capsule  3   No current facility-administered medications on file prior to visit.    Allergies  Allergen Reactions  . Sulfamethoxazole Shortness Of Breath    chest tightness  . Iohexol   . Iodinated Diagnostic Agents     Pt states at Dr.Grapeys office 15 years ago  blacked out for 2 hours during injection for IVP. Was told never to have IV dye again.     Review of Systems  Review of Systems  Constitutional: Negative for fever, chills and malaise/fatigue.  HENT: Negative for hearing loss, nosebleeds and congestion.   Eyes: Negative for discharge.  Respiratory: Positive for shortness of breath. Negative for cough, sputum production and wheezing.   Cardiovascular: Positive for chest pain. Negative for palpitations and leg swelling.  Gastrointestinal: Positive for heartburn, nausea and abdominal pain. Negative for vomiting, diarrhea, constipation and blood in stool.  Genitourinary: Negative for dysuria, urgency, frequency and hematuria.  Musculoskeletal: Negative for myalgias, back pain and falls.  Skin: Negative for rash.  Neurological: Negative for dizziness, tremors, sensory change, focal weakness, loss of consciousness, weakness and headaches.  Endo/Heme/Allergies: Negative for polydipsia. Does not bruise/bleed easily.  Psychiatric/Behavioral: Negative for depression and suicidal ideas. The patient is not nervous/anxious and does not have insomnia.     Objective  BP 153/81  Pulse 79  Temp(Src) 98 F (36.7 C) (Oral)  Ht 5\' 5"  (1.651 m)  Wt 157 lb 1.9 oz (71.269 kg)  BMI 26.15 kg/m2  SpO2 98%  Physical Exam  Physical Exam  Constitutional: She is oriented to person, place, and time and well-developed, well-nourished, and in no distress. No distress.  HENT:  Head: Normocephalic and atraumatic.  Right Ear: External ear normal.  Left Ear: External ear normal.  Nose: Nose normal.  Mouth/Throat: Oropharynx is clear and moist. No oropharyngeal exudate.  Eyes: Conjunctivae are normal. Pupils are equal, round, and reactive to light. Right eye exhibits no discharge. Left eye exhibits no discharge. No scleral icterus.  Neck: Normal range of motion. Neck supple. No thyromegaly present.  Cardiovascular: Normal rate, regular rhythm and intact distal  pulses.   Murmur heard.      1/6 systolic murmur  Pulmonary/Chest: Effort normal and breath sounds normal. No respiratory distress. She has no wheezes. She has no rales.  Abdominal: Soft. Bowel sounds are normal. She exhibits no distension and no mass. There is no tenderness.  Musculoskeletal: Normal range of motion. She exhibits no edema and no tenderness.  Lymphadenopathy:    She has no cervical adenopathy.  Neurological: She is alert and oriented to person, place, and time. She has normal reflexes. No cranial nerve deficit. Coordination normal.  Skin: Skin is warm and dry. No rash noted. She is not diaphoretic.  Psychiatric: Mood, memory and affect normal.       Assessment & Plan  GERD (gastroesophageal reflux disease) Patient with h/o hiatal  hernia and worsening reflux, also noted to have a h/o Barrrett's esophagitis, CDiff and Diverticulitis, has not seen GI recently requesting a referral for ongoing care with GI will arrange. Is encouraged to take Nexium in am and Rantitidine in pm and f/u with GI, avoid offending foods  HYPERTENSION Mildly elevated today but patient very nervous about being here. Will reassess at next visit. No change in meds at this time  Chest pain, atypical Likely related to Reflux but also a slight murmur noted and a sense of dyspnea when the pain occurs, will start with a 2 d Echo but proceed with further work up if symptoms worsen

## 2011-07-14 NOTE — Assessment & Plan Note (Signed)
Mildly elevated today but patient very nervous about being here. Will reassess at next visit. No change in meds at this time

## 2011-07-14 NOTE — Assessment & Plan Note (Addendum)
Patient with h/o hiatal hernia and worsening reflux, also noted to have a h/o Barrrett's esophagitis, CDiff and Diverticulitis, has not seen GI recently requesting a referral for ongoing care with GI will arrange. Is encouraged to take Nexium in am and Rantitidine in pm and f/u with GI, avoid offending foods

## 2011-07-14 NOTE — Telephone Encounter (Signed)
Patient called back & said she does not want to do the reclast, she feels like it would be too much on her body

## 2011-07-14 NOTE — Telephone Encounter (Signed)
Patient informed. 

## 2011-07-14 NOTE — Telephone Encounter (Signed)
Please advise 

## 2011-07-15 ENCOUNTER — Ambulatory Visit (HOSPITAL_COMMUNITY): Payer: Self-pay | Attending: Family Medicine | Admitting: Radiology

## 2011-07-15 DIAGNOSIS — R011 Cardiac murmur, unspecified: Secondary | ICD-10-CM | POA: Insufficient documentation

## 2011-07-15 DIAGNOSIS — I079 Rheumatic tricuspid valve disease, unspecified: Secondary | ICD-10-CM | POA: Insufficient documentation

## 2011-07-15 DIAGNOSIS — I059 Rheumatic mitral valve disease, unspecified: Secondary | ICD-10-CM | POA: Insufficient documentation

## 2011-07-15 DIAGNOSIS — I10 Essential (primary) hypertension: Secondary | ICD-10-CM | POA: Insufficient documentation

## 2011-07-17 ENCOUNTER — Encounter (INDEPENDENT_AMBULATORY_CARE_PROVIDER_SITE_OTHER): Payer: Self-pay | Admitting: Internal Medicine

## 2011-07-17 ENCOUNTER — Ambulatory Visit (INDEPENDENT_AMBULATORY_CARE_PROVIDER_SITE_OTHER): Payer: Self-pay | Admitting: Internal Medicine

## 2011-07-17 VITALS — BP 122/60 | HR 66 | Temp 98.1°F | Ht 65.0 in | Wt 159.2 lb

## 2011-07-17 DIAGNOSIS — A0472 Enterocolitis due to Clostridium difficile, not specified as recurrent: Secondary | ICD-10-CM

## 2011-07-17 NOTE — Patient Instructions (Signed)
List of GI physician in this area was given to patient. She was advised that we do not do second opinions

## 2011-07-17 NOTE — Progress Notes (Signed)
Subjective:     Patient ID: Megan [name], female   DOB: Apr 05, 1951, 60 y.o.   MRN: [id number]  HPI  Referred by Dr. Joaquin Courts for c-difficile.  She says she wants a second opinion for her c-difficile. She developed C-difficile in July of this year.  She had not been on antibiotics prior to developing C-diff.  She has been on Vancomycin x 2 and now is on Flagyl.  She has just finished the Flagyl. She tells me she has 12 stools a day.  She has fecal incontinence.  This will occur when she is walking. Stools are very small.  Her stools are formed.Her stools are average size now.  Appetite is not good. She has gained weight however.  She says she has a hiatal hernia.  She tells me she has some rectal bleeding because she is raw.   She last saw Dr. Jarold Motto in September. She tells me she has had 3 EGD this year. Her last colonoscopy 2 yrs ago and was normal by  Dr. Jarold Motto.    Patient is actually here for a second opinion she states. She says Dr. Jarold Motto has not done anything for her.  I advised her that our practice does not do second opinions.  Review of Systems see hpi     Objective:   Physical Exam N/A     Assessment:    Patient is here for a second opinion for her c-difficile.  She last saw Dr. Jarold Motto in September of this year.    Plan:        We do not do second opinions.  I have gave her names of other GI in this area.

## 2011-07-18 ENCOUNTER — Other Ambulatory Visit: Payer: Self-pay | Admitting: Family Medicine

## 2011-07-18 ENCOUNTER — Inpatient Hospital Stay (HOSPITAL_COMMUNITY): Admission: RE | Admit: 2011-07-18 | Payer: Self-pay | Source: Ambulatory Visit

## 2011-07-21 ENCOUNTER — Ambulatory Visit (INDEPENDENT_AMBULATORY_CARE_PROVIDER_SITE_OTHER): Payer: Self-pay | Admitting: Family Medicine

## 2011-07-21 ENCOUNTER — Encounter: Payer: Self-pay | Admitting: Family Medicine

## 2011-07-21 DIAGNOSIS — I340 Nonrheumatic mitral (valve) insufficiency: Secondary | ICD-10-CM

## 2011-07-21 DIAGNOSIS — A0472 Enterocolitis due to Clostridium difficile, not specified as recurrent: Secondary | ICD-10-CM

## 2011-07-21 DIAGNOSIS — I10 Essential (primary) hypertension: Secondary | ICD-10-CM

## 2011-07-21 DIAGNOSIS — B370 Candidal stomatitis: Secondary | ICD-10-CM

## 2011-07-21 DIAGNOSIS — I071 Rheumatic tricuspid insufficiency: Secondary | ICD-10-CM

## 2011-07-21 DIAGNOSIS — I059 Rheumatic mitral valve disease, unspecified: Secondary | ICD-10-CM

## 2011-07-21 DIAGNOSIS — I079 Rheumatic tricuspid valve disease, unspecified: Secondary | ICD-10-CM

## 2011-07-21 DIAGNOSIS — E039 Hypothyroidism, unspecified: Secondary | ICD-10-CM

## 2011-07-21 HISTORY — DX: Candidal stomatitis: B37.0

## 2011-07-21 HISTORY — DX: Rheumatic tricuspid insufficiency: I07.1

## 2011-07-21 MED ORDER — FLUCONAZOLE 150 MG PO TABS: ORAL_TABLET | ORAL | Status: DC

## 2011-07-21 MED ORDER — METRONIDAZOLE 500 MG PO TABS: 500.0000 mg | ORAL_TABLET | Freq: Three times a day (TID) | ORAL | Status: DC

## 2011-07-21 NOTE — Patient Instructions (Signed)
Clostridium Difficile Infection Clostridium difficile (C. diff) is a bacteria found in the intestinal tract or colon. Under certain conditions, it causes diarrhea and sometimes severe disease. The severe form of the disease is known as pseudomembranous colitis (often called C. diff colitis). This disease can damage the lining of the colon or cause the colon to become enlarged (toxic megacolon).  CAUSES  Your colon normally contains many different bacteria, including C. diff. The balance of bacteria in your colon can change during illness. This is especially true when you take antibiotic medicine. Taking antibiotics may allow the C. diff to grow, multiply excessively, and make a toxin that then causes illness. The elderly and people with certain medical conditions have a greater risk of getting C. diff infections. SYMPTOMS   Watery diarrhea.   Fever.   Fatigue.   Loss of appetite.   Nausea.   Abdominal swelling, pain, or tenderness.   Dehydration.  DIAGNOSIS  Your symptoms may make your caregiver suspicious of a C. diff infection, especially if you have used antibiotics in the preceding weeks. However, there are only 2 ways to know for certain whether you have a C. diff infection:  A lab test that finds the toxin in your stool.   The specific appearance of an abnormality (pseudomembrane) in your colon. This can only be seen by doing a sigmoidoscopy or colonoscopy. These procedures involve passing an instrument through your rectum to look at the inside of your colon.  Your caregiver will help determine if these tests are necessary. TREATMENT   Most people are successfully treated with 1 of 2 specific antibiotics, usually given by mouth. Other antibiotics you are receiving are stopped if possible.   Intravenous (IV) fluids and correction of electrolyte imbalance may be necessary.   Rarely, surgery may be needed to remove the infected part of the intestines.   Careful hand washing by  you and your caregivers is important to prevent the spread of infection. In the hospital, your caregivers may also put on gowns and gloves to prevent the spread of the C. diff bacteria. Your room is also cleaned regularly with a hospital grade disinfectant.  HOME CARE INSTRUCTIONS  Drink enough fluids to keep your urine clear or pale yellow. Avoid milk, caffeine, and alcohol.   Ask your caregiver for specific rehydration instructions.   Try eating small, frequent meals rather than large meals.   Take your antibiotics as directed. Finish them even if you start to feel better.   Do not use medicines to slow diarrhea. This could delay healing or cause complications.   Wash your hands thoroughly after using the bathroom and before preparing food.   Make sure people who live with you wash their hands often, too.  SEEK MEDICAL CARE IF:  Diarrhea persists longer than expected or recurs after completing your course of antibiotic treatment for the C. diff infection.   You have trouble staying hydrated.  SEEK IMMEDIATE MEDICAL CARE IF:  You develop a new fever.   You have increasing abdominal pain or tenderness.   There is blood in your stools, or your stools are dark black and tarry.   You cannot hold down food or liquids.  MAKE SURE YOU:   Understand these instructions.   Will watch your condition.   Will get help right away if you are not doing well or get worse.  Document Released: 05/14/2005 Document Revised: 04/16/2011 Document Reviewed: 01/10/2011 [facility] Patient Information 2012 [place], [place].  For the rectal irritation  cleanse with [facility] Astringent frequently and then try applying Desitin to skin as needed, may consider Anusol HC suppositories twice daily for any enlarged hemorrhoids Try Flaxseed ground up daily a couple tsp a day

## 2011-07-21 NOTE — Assessment & Plan Note (Signed)
Persistent despite treatment with Flagyl, vancomycin and Lactinex at various times. She gets better for a time on the meds and then worsens again. Will restart Flagyl and continue Lactinex, will also refer to infectious disease for further management secondary to persistent or recurrent disease.

## 2011-07-21 NOTE — Progress Notes (Signed)
Megan [name] [id number] 08-11-1951 07/21/2011      Progress Note-Follow Up  Subjective  Chief Complaint  Chief Complaint  Patient presents with  . Follow-up    2 week follow up    HPI  60 year old Caucasian female who is in today for reevaluation of multiple medical problems. Her first visit with [place] was her last visit with [place] and she was noted to have significant GI disease. She has a history of hiatal hernia, Barrett's esophagus a and C. differential colitis. She also has diverticulosis or bowel syndrome reflux and chronic rectal irritation with hemorrhoids. She was referred to a secondary gastroenterologist for a second opinion but they declined to give her a second opinion on her options especially around the hiatal hernia at this time. Unfortunately at her visit she was complaining of some diarrhea with a history of C. difficile she was tested she was found to be positive again. She declined vancomycin for cost reasons and went back on her Flagyl and Lactinex and she notes that her symptoms are notably better. Unfortunately they are still malodorous and frequent. No severe abdominal pain or chest pain. She does continue to trouble with rectal irritation and occasional bleeding due to the frequent stooling. No fevers or chills. No trips to the ER or urinary complaints noted today. She does note that whenever she been on Flagyl and go or any antibiotic she'll think it's a white coating on her tongue and developed blisters in her throat. She did get at this time it resolved when she finished the Flagyl several days ago.  Past Medical History  Diagnosis Date  . GERD (gastroesophageal reflux disease)   . Depression   . Anxiety   . Insomnia   . IBS (irritable bowel syndrome)   . Diverticulosis   . Barrett esophagus   . Hiatal hernia   . Unspecified menopausal and postmenopausal disorder   . Vitamin B12 deficiency   . Blind loop syndrome   . Hepatic cyst   . Unspecified  hypothyroidism   . Diarrhea   . Family history of malignant neoplasm of gastrointestinal tract     multiple members  . Endometrial hyperplasia 02/27/2006    BENIGN ENDO BX ON 02/2007  . C. difficile diarrhea   . Osteoporosis 05/2011    -2.5 Lt femoral neck  . Cystocele, midline   . Rectocele   . Uterine prolapse without mention of vaginal wall prolapse   . Chest pain, atypical 07/14/2011  . Leaky heart valve   . Hiatal hernia     Past Surgical History  Procedure Date  . Appendectomy   . Uterine fibroid surgery   . Hysteroscopy     POLYP    Family History  Problem Relation Age of Onset  . Colon cancer Mother   . Diabetes Mother   . Cancer Mother     colon  . Hypertension Mother   . Colon polyps Father   . Colon cancer Maternal Aunt   . Colon cancer Maternal Grandmother   . Breast cancer Maternal Grandmother   . Diabetes Paternal Grandmother   . Breast cancer Paternal Grandmother   . Colon cancer Cousin     X3  . Ovarian cancer Cousin     History   Social History  . Marital Status: Married    Spouse Name: N/A    Number of Children: 4  . Years of Education: N/A   Occupational History  . Retired    Social History Main  Topics  . Smoking status: Former Smoker    Quit date: 08/19/1995  . Smokeless tobacco: Never Used  . Alcohol Use: No  . Drug Use: No  . Sexually Active: No   Other Topics Concern  . Not on file   Social History Narrative  . No narrative on file    Current Outpatient Prescriptions on File Prior to Visit  Medication Sig Dispense Refill  . albuterol (PROVENTIL HFA;VENTOLIN HFA) 108 (90 BASE) MCG/ACT inhaler Inhale 2 puffs into the lungs every 6 (six) hours as needed for wheezing.  18 g  0  . amLODipine (NORVASC) 5 MG tablet Take 5 mg by mouth daily.        . Calcium Carbonate (CALCIUM 600 PO) Take by mouth.        Marland Kitchen CARAFATE 1 GM/10ML suspension TAKE TWO TEASPOONSFUL BY MOUTH 4 TIMES DAILY  420 mL  1  . Cholecalciferol (VITAMIN D) 2000  UNITS CAPS Take by mouth.        . clidinium-chlordiazePOXIDE (LIBRAX) 2.5-5 MG per capsule Take one capsule by mouth 3 times daily when needed for abdominal pain.  60 capsule  1  . fish oil-omega-3 fatty acids 1000 MG capsule Take 2 g by mouth daily.        Marland Kitchen lactobacillus acidophilus & bulgar (LACTINEX) chewable tablet Chew 2 tablets by mouth 3 (three) times daily with meals.  180 tablet  0  . LORazepam (ATIVAN) 2 MG tablet Take 1 tablet (2 mg total) by mouth every 8 (eight) hours as needed for anxiety.  90 tablet  0  . ranitidine (ZANTAC) 300 MG tablet Take 1 tablet (300 mg total) by mouth at bedtime.  30 tablet  1    Allergies  Allergen Reactions  . Sulfamethoxazole Shortness Of Breath    chest tightness  . Iohexol   . Iodinated Diagnostic Agents     Pt states at Dr.Grapeys office 15 years ago blacked out for 2 hours during injection for IVP. Was told never to have IV dye again.     Review of Systems  Review of Systems  Constitutional: Positive for chills and malaise/fatigue. Negative for fever.  HENT: Positive for sore throat. Negative for congestion.        Coating on tongue and blisters in throat after treatment with any antibiotic, better today since being off meds for several days  Eyes: Negative for discharge.  Respiratory: Negative for shortness of breath.   Cardiovascular: Negative for chest pain, palpitations and leg swelling.  Gastrointestinal: Positive for heartburn, abdominal pain, diarrhea and blood in stool. Negative for nausea.  Genitourinary: Negative for dysuria.  Musculoskeletal: Negative for falls.  Skin: Negative for rash.  Neurological: Negative for loss of consciousness and headaches.  Endo/Heme/Allergies: Negative for polydipsia.  Psychiatric/Behavioral: Negative for depression and suicidal ideas. The patient is not nervous/anxious and does not have insomnia.     Objective  BP 144/83  Pulse 77  Temp(Src) 98 F (36.7 C) (Oral)  Ht 5\' 5"  (1.651 m)   Wt 158 lb 12.8 oz (72.031 kg)  BMI 26.43 kg/m2  SpO2 97%  Physical Exam  Physical Exam  Constitutional: She is oriented to person, place, and time and well-developed, well-nourished, and in no distress. No distress.  HENT:  Head: Normocephalic and atraumatic.  Nose: Nose normal.  Mouth/Throat: Oropharynx is clear and moist. No oropharyngeal exudate.       Slight white coating on tongue  Eyes: Conjunctivae are normal.  Neck: Neck supple.  No thyromegaly present.  Cardiovascular: Normal rate, regular rhythm and normal heart sounds.   No murmur heard. Pulmonary/Chest: Effort normal and breath sounds normal. She has no wheezes.  Abdominal: She exhibits no distension and no mass.  Musculoskeletal: She exhibits no edema.  Lymphadenopathy:    She has no cervical adenopathy.  Neurological: She is alert and oriented to person, place, and time.  Skin: Skin is warm and dry. No rash noted. She is not diaphoretic.  Psychiatric: Memory, affect and judgment normal.    Lab Results  Component Value Date   TSH 0.66 12/13/2010   Lab Results  Component Value Date   WBC 5.6 06/30/2011   HGB 13.6 06/30/2011   HCT 40.9 06/30/2011   MCV 90.5 06/30/2011   PLT 309 06/30/2011   Lab Results  Component Value Date   CREATININE 0.7 06/30/2011   BUN 9 06/30/2011   NA 141 06/30/2011   K 3.7 06/30/2011   CL 107 06/30/2011   CO2 27 06/30/2011   Lab Results  Component Value Date   ALT 19 06/30/2011   AST 21 06/30/2011   ALKPHOS 100 06/30/2011   BILITOT 0.7 06/30/2011   Lab Results  Component Value Date   CHOL 218* 06/30/2011   Lab Results  Component Value Date   HDL 55.80 06/30/2011   No results found for this basename: [facility]   Lab Results  Component Value Date   TRIG 80.0 06/30/2011   Lab Results  Component Value Date   CHOLHDL 4 06/30/2011     Assessment & Plan  C. difficile diarrhea Persistent despite treatment with Flagyl, vancomycin and Lactinex at various times. She  gets better for a time on the meds and then worsens again. Will restart Flagyl and continue Lactinex, will also refer to infectious disease for further management secondary to persistent or recurrent disease.   Tricuspid regurgitation Asymptomatic but does already see cardiology, patient encouraged to discuss with cardiology and let us know if any concerning symptoms occur.   HYPOTHYROIDISM Well treated on current dose of Synthroid continue the same  HYPERTENSION Mild elevation today but patient continues to be ill, will reassess at next visit.  Thrush Diflucan 150 mg po q week x 4 weeks

## 2011-07-21 NOTE — Assessment & Plan Note (Signed)
Well treated on current dose of Synthroid continue the same

## 2011-07-21 NOTE — Assessment & Plan Note (Signed)
Mild elevation today but patient continues to be ill, will reassess at next visit.

## 2011-07-21 NOTE — Assessment & Plan Note (Signed)
Asymptomatic but does already see cardiology, patient encouraged to discuss with cardiology and let us know if any concerning symptoms occur.

## 2011-07-21 NOTE — Assessment & Plan Note (Signed)
Diflucan 150 mg po q week x 4 weeks

## 2011-07-22 ENCOUNTER — Telehealth: Payer: Self-pay | Admitting: *Deleted

## 2011-07-22 NOTE — Telephone Encounter (Signed)
FYI , found out patient had cancelled appointment for Reclast for 07/18/11.  Called patient to find out what happened.  She stated still fighting C-Diff and was advised by Dr. Reuel Derby to put that "on the back burner for now". So she will be back in touch to reschedule.  I told patient she would have to have labs redrawn within 30 days of rescheduling.

## 2011-07-29 ENCOUNTER — Encounter: Payer: Self-pay | Admitting: Internal Medicine

## 2011-07-29 ENCOUNTER — Ambulatory Visit (INDEPENDENT_AMBULATORY_CARE_PROVIDER_SITE_OTHER): Payer: Self-pay | Admitting: Internal Medicine

## 2011-07-29 VITALS — BP 162/76 | HR 79 | Temp 97.7°F | Ht 65.0 in | Wt 159.0 lb

## 2011-07-29 DIAGNOSIS — A0472 Enterocolitis due to Clostridium difficile, not specified as recurrent: Secondary | ICD-10-CM

## 2011-07-29 DIAGNOSIS — Z23 Encounter for immunization: Secondary | ICD-10-CM

## 2011-07-29 DIAGNOSIS — Z0000 Encounter for general adult medical examination without abnormal findings: Secondary | ICD-10-CM

## 2011-07-29 LAB — COMPREHENSIVE METABOLIC PANEL
ALT: 21 U/L (ref 0–35)
AST: 23 U/L (ref 0–37)
Alkaline Phosphatase: 89 U/L (ref 39–117)
BUN: 8 mg/dL (ref 6–23)
Calcium: 9.9 mg/dL (ref 8.4–10.5)
Chloride: 103 mEq/L (ref 96–112)
Creat: 0.61 mg/dL (ref 0.50–1.10)

## 2011-07-29 LAB — CBC WITH DIFFERENTIAL/PLATELET
Basophils Absolute: 0 10*3/uL (ref 0.0–0.1)
Basophils Relative: 0 % (ref 0–1)
Eosinophils Relative: 3 % (ref 0–5)
Lymphocytes Relative: 39 % (ref 12–46)
MCHC: 33.6 g/dL (ref 30.0–36.0)
MCV: 91.1 fL (ref 78.0–100.0)
Neutro Abs: 3.6 10*3/uL (ref 1.7–7.7)
Platelets: 325 10*3/uL (ref 150–400)
RDW: 13 % (ref 11.5–15.5)
WBC: 7.2 10*3/uL (ref 4.0–10.5)

## 2011-07-29 MED ORDER — VANCOMYCIN HCL 125 MG PO CAPS: 125.0000 mg | ORAL_CAPSULE | Freq: Four times a day (QID) | ORAL | Status: AC

## 2011-07-29 MED ORDER — VANCOMYCIN 50 MG/ML ORAL SOLUTION: ORAL | Status: DC

## 2011-07-29 NOTE — Progress Notes (Signed)
INFECTIOUS DISEASE CLINIC NOTE  RFV: initial visit, community referral for c.difficile treatment  Subjective:    Patient ID: Megan [name], female    DOB: 1950/12/06, 60 y.o.   MRN: [id number]  HPI Megan [name] is a 60yo Female that has hisotyr of barrett's esophagus, GERD, hiatal hernia who was being worked up for chronic diarrhea in July 2012. She was found to have community acquired C.Difficile infection since she denied having any recent hospitalization, nor any antibiotic usage. She was initially prescribed 1 month dose of oral liquid vancomycin QID without taper. Initially had some benefit, but then reoccurred, with increasing stools shortly after finishing her first course of therapy. She was ruled out for parasites, unclear if she was tested for giardia or other enteric infections. The patient then was given a 3 wk course of po vancomycin with similar improvements but now had thrush in addition to her diarrheal infection. She reports having 2 addn course of metronidazole, the last of which finished 10 days ago. She now reports having anywhere from 10-20 loose stools. Some fecal incontinence, however she was recently diagnosed with vaginal/rectal prolapse? Surgery postponed until her c.difficile in under control. The patient has been suffering for now 6 months. She denies weight loss, has had about 10-15 weight gain during this time period. No fever.chills.nightsweats.decreased appetite.nor decrease urine output. Does have flatulence. No significant bowel controls + fecal incontinence. Foul smelling stool. occ abdominal cramping.  No recent travel. Drinks [employer]. Her husband and son do not have c.difficile.  Active Ambulatory Problems    Diagnosis Date Noted  . BASAL CELL CARCINOMA, FACE 11/15/2009  . HYPOTHYROIDISM 10/23/2008  . VITAMIN B12 DEFICIENCY 02/15/2009  . ANXIETY DEPRESSION 12/14/2007  . MIGRAINE HEADACHE 11/15/2009  . HYPERTENSION 01/04/2009  . GERD 01/26/2009  .  DIVERTICULOSIS-COLON 01/26/2009  . 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 INSOMNIA 12/14/2007  . WEIGHT GAIN 06/05/2009  . DIARRHEA, CHRONIC 12/05/2008  . HYPERGLYCEMIA 06/05/2009  . BASAL CELL CARCINOMA, FACE 11/15/2009  . FLATULENCE-GAS-BLOATING 01/26/2009  . POSTMENOPAUSAL SYNDROME 09/10/2010  . Myositis 09/25/2010  . Chest pain 12/13/2010  . Anxiety disorder 12/13/2010  . Pleuritic chest pain 12/22/2010  . Barrett's esophagus 03/04/2011  . Hiatal hernia 03/04/2011  . C. difficile colitis 04/24/2011  . IBS (irritable bowel syndrome) 04/24/2011  . Chronic diarrhea 04/24/2011  . C. difficile diarrhea 05/08/2011  . Endometrial hyperplasia   . Diverticulosis   . Abdominal pain 06/17/2011  . GERD (gastroesophageal reflux disease) 06/17/2011  . Clostridium difficile enterocolitis 06/17/2011  . Chest pain, atypical 07/14/2011  . 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 Mitral regurgitation 07/21/2011  . Thrush 07/21/2011   Resolved Ambulatory Problems    Diagnosis Date Noted  . Other specified disorders of liver 09/14/2008  . Edema 10/23/2008   Past Medical History  Diagnosis Date  . Depression   . Anxiety   . Insomnia   . Barrett esophagus   . Vitamin B12 deficiency   . Hepatic cyst   . Family history of malignant neoplasm of gastrointestinal tract   . Osteoporosis 05/2011  . Cystocele, midline   . Rectocele   . Uterine prolapse without mention of vaginal wall prolapse   . Leaky heart valve    Allergies  Allergen Reactions  . Sulfamethoxazole Shortness Of Breath    chest tightness  . Iohexol   . Iodinated Diagnostic Agents     Pt states at Dr.Grapeys office 15 years ago blacked out for 2 hours during injection for IVP. Was  told never to have IV dye again.    Current Outpatient Prescriptions  Medication Sig Dispense Refill  . albuterol (PROVENTIL HFA;VENTOLIN HFA) 108 (90 BASE) MCG/ACT inhaler Inhale 2 puffs into the lungs every 6 (six) hours as needed for wheezing.  18  g  0  . amLODipine (NORVASC) 5 MG tablet Take 5 mg by mouth daily.        . Calcium Carbonate (CALCIUM 600 PO) Take by mouth.        Marland Kitchen CARAFATE 1 GM/10ML suspension TAKE TWO TEASPOONSFUL BY MOUTH 4 TIMES DAILY  420 mL  1  . Cholecalciferol (VITAMIN D) 2000 UNITS CAPS Take by mouth.        . clidinium-chlordiazePOXIDE (LIBRAX) 2.5-5 MG per capsule Take one capsule by mouth 3 times daily when needed for abdominal pain.  60 capsule  1  . fish oil-omega-3 fatty acids 1000 MG capsule Take 2 g by mouth daily.        . fluconazole (DIFLUCAN) 150 MG tablet 1 tab po weekly x 4 weeks  4 tablet  1  . lactobacillus acidophilus & bulgar (LACTINEX) chewable tablet Chew 2 tablets by mouth 3 (three) times daily with meals.  180 tablet  0  . LORazepam (ATIVAN) 2 MG tablet Take 1 tablet (2 mg total) by mouth every 8 (eight) hours as needed for anxiety.  90 tablet  0  . ranitidine (ZANTAC) 300 MG tablet Take 1 tablet (300 mg total) by mouth at bedtime.  30 tablet  1  . metroNIDAZOLE (FLAGYL) 500 MG tablet Take 1 tablet (500 mg total) by mouth 3 (three) times daily. X 14 days  42 tablet  0  . vancomycin (VANCOCIN) 125 MG capsule Take 1 capsule (125 mg total) by mouth 4 (four) times daily. Take 1 tab 4 times daily x 2 wk; followed by 1 tab 3 times daily x 2 wk; then 1 tab 2 times daily x 2wk; then 1 tab daily x 2wk  140 capsule  0   History   Social History  . Marital Status: Married    Spouse Name: N/A    Number of Children: 4  . Years of Education: N/A   Occupational History  . Retired    Social History Main Topics  . Smoking status: Former Smoker    Quit date: 08/19/1995  . Smokeless tobacco: Never Used  . Alcohol Use: No  . Drug Use: No  . Sexually Active: No   Other Topics Concern  . Not on file   Social History Narrative  . No narrative on file   Family History  Problem Relation Age of Onset  . Colon cancer Mother   . Diabetes Mother   . Cancer Mother     colon  . Hypertension Mother     . Colon polyps Father   . Colon cancer Maternal Aunt   . Colon cancer Maternal Grandmother   . Breast cancer Maternal Grandmother   . Diabetes Paternal Grandmother   . Breast cancer Paternal Grandmother   . Colon cancer Cousin     X3  . Ovarian cancer Cousin      Review of Systems Review of Systems  Constitutional: Negative for fever, chills, diaphoresis, activity change, appetite change, and unexpected weight change. Positive for fatigue HENT: Negative for congestion, sore throat, rhinorrhea, sneezing, trouble swallowing and sinus pressure. Does have thrush when she takes antibiotics Eyes: Negative for photophobia and visual disturbance.  Respiratory: Negative for cough, chest tightness, shortness  of breath, wheezing and stridor.  Cardiovascular: Negative for chest pain, palpitations and leg swelling.  Gastrointestinal: positive for abdominal pain, diarrhea, reflux, occassional nausea Genitourinary: Negative for dysuria, hematuria, flank pain and difficulty urinating.  Musculoskeletal: Negative for myalgias, back pain, joint swelling, arthralgias and gait problem.  Skin: Negative for color change, pallor, rash and wound.  Neurological: Negative for dizziness, tremors, weakness and light-headedness.  Hematological: Negative for adenopathy. Does not bruise/bleed easily.  Psychiatric/Behavioral: Negative for behavioral problems, confusion, sleep disturbance, dysphoric mood, decreased concentration and agitation.       Objective:   Physical Exam BP 162/76  Pulse 79  Temp(Src) 97.7 F (36.5 C) (Oral)  Ht 5\' 5"  (1.651 m)  Wt 159 lb (72.122 kg)  BMI 26.46 kg/m2  General Appearance:    Alert, cooperative, no distress, appears stated age  Head:    Normocephalic, without obvious abnormality, atraumatic  Eyes:    PERRL, conjunctiva/corneas clear, EOM's intact, fundi    benign, both eyes  Ears:    Normal TM's and external ear canals, both ears  Nose:   Nares normal, septum midline,  mucosa normal, no drainage    or sinus tenderness  Throat:   Lips, mucosa, and tongue normal; teeth and gums normal  Neck:   Supple, symmetrical, trachea midline, no adenopathy;    thyroid:  no enlargement/tenderness/nodules; no carotid   bruit or JVD     Lungs:     Clear to auscultation bilaterally, respirations unlabored      Heart:    Regular rate and rhythm, S1 and S2 normal, no murmur, rub   or gallop     Abdomen:     Soft, mildly tender in all 4 quadrants on deep palpation, no rebound or guarding, bowel sounds active all four quadrants,    no masses, no organomegaly        Extremities:   Extremities normal, atraumatic, no cyanosis or edema  Pulses:   2+ and symmetric all extremities  Skin:   Skin color, texture, turgor normal, no rashes or lesions  Lymph nodes:   Cervical, supraclavicular, and axillary nodes normal     Old labs: cdiff pcr positive on 07/02/11 11/15: wbc 6.6, hct 40.9, cr 0.7, alb 4.     Assessment & Plan:  Recurrent c.difficile colitis  1) initially wanted to do nitazoxanide but medications too cost prohibitive at $1500 for 2 wks. Will repeat a prolonged vancomycin taper of 125mg  PO QID x 2wk, followed by 125mg  TID x 2wks, then BId x 2 wks, then daily.  Will ask patient to stop taking carafate as that might decrease absorption of vancomycin.  2) if symptoms not improved, will do a trial of fecal bacteriotherapy via at home enema. Patient has a son and husband who don't have c.diff who could be potential donors. Patient in amenable to trying this therapy since she has had this infection for 6 months now.  3) health maintenance = will give flu vaccine today.  Will call back in 2 wks to touch base on her symptoms.   If no improvement in next 10days, will send stool for giarrdia, cryptosporidium, stool culture Her cell: [phone]

## 2011-07-31 ENCOUNTER — Telehealth: Payer: Self-pay | Admitting: Family Medicine

## 2011-07-31 NOTE — Telephone Encounter (Signed)
Patient has seen the infectious disease doctor, the doctor took her off carafate, she is now having formed BM's, she is supposed to start taking Vancocin. Can she pick up supplies tomorrow 08/01/11 to have another cdiff test? She will make an appt to followup with you early next week.

## 2011-07-31 NOTE — Telephone Encounter (Signed)
Doesn't the infectious disease doctor want to run his own CDiff test? If not we can run it but I would run it after the course of Vancomycin

## 2011-08-01 NOTE — Telephone Encounter (Signed)
SW patient, she is agreeable to wait for test since she felt like she "smelled" the cdiff again this morning. She did mention that they did not test her at infectious disease. She is on the schedule with you the later part of January

## 2011-08-15 ENCOUNTER — Other Ambulatory Visit: Payer: Self-pay | Admitting: *Deleted

## 2011-08-15 DIAGNOSIS — A0472 Enterocolitis due to Clostridium difficile, not specified as recurrent: Secondary | ICD-10-CM

## 2011-08-15 MED ORDER — LACTINEX PO CHEW: 2.0000 | CHEWABLE_TABLET | Freq: Three times a day (TID) | ORAL | Status: DC

## 2011-08-21 ENCOUNTER — Telehealth: Payer: Self-pay | Admitting: *Deleted

## 2011-08-21 NOTE — Telephone Encounter (Signed)
States she is still stooling 6 times or more a day. "tired" of this. Wants to know what to do. I told her I will check with md & call her back. The OV notes from last visit have different things md wrote to do next. Will have md call pt

## 2011-08-21 NOTE — Telephone Encounter (Signed)
I left her a message that Dr. Drue Second wanted to see her next Tuesday. Told her we had cancelled her appt tomorrow. I told Corrie Dandy at front desk that when pt calls make her an appt next Tues with Dr. Drue Second at 2 or 2:30

## 2011-08-21 NOTE — Telephone Encounter (Signed)
I asked Megan [name] to put her in an appt tomorrow as pt has called back concerned about being seen

## 2011-08-22 ENCOUNTER — Ambulatory Visit: Payer: Self-pay | Admitting: Infectious Diseases

## 2011-08-26 ENCOUNTER — Encounter: Payer: Self-pay | Admitting: Internal Medicine

## 2011-08-26 ENCOUNTER — Ambulatory Visit (INDEPENDENT_AMBULATORY_CARE_PROVIDER_SITE_OTHER): Payer: Self-pay | Admitting: Internal Medicine

## 2011-08-26 VITALS — BP 147/89 | HR 84 | Temp 98.0°F | Wt 159.0 lb

## 2011-08-26 DIAGNOSIS — K529 Noninfective gastroenteritis and colitis, unspecified: Secondary | ICD-10-CM

## 2011-08-26 DIAGNOSIS — R197 Diarrhea, unspecified: Secondary | ICD-10-CM

## 2011-08-27 ENCOUNTER — Other Ambulatory Visit: Payer: Self-pay | Admitting: Internal Medicine

## 2011-08-27 ENCOUNTER — Other Ambulatory Visit (INDEPENDENT_AMBULATORY_CARE_PROVIDER_SITE_OTHER): Payer: Self-pay

## 2011-08-27 ENCOUNTER — Other Ambulatory Visit: Payer: Self-pay | Admitting: *Deleted

## 2011-08-27 DIAGNOSIS — K529 Noninfective gastroenteritis and colitis, unspecified: Secondary | ICD-10-CM

## 2011-08-27 DIAGNOSIS — A0472 Enterocolitis due to Clostridium difficile, not specified as recurrent: Secondary | ICD-10-CM

## 2011-08-27 DIAGNOSIS — R197 Diarrhea, unspecified: Secondary | ICD-10-CM

## 2011-08-28 LAB — GIARDIA/CRYPTOSPORIDIUM (EIA)
Cryptosporidium Screen (EIA): NEGATIVE
Giardia Screen (EIA): NEGATIVE

## 2011-08-29 ENCOUNTER — Encounter (INDEPENDENT_AMBULATORY_CARE_PROVIDER_SITE_OTHER): Payer: Self-pay

## 2011-08-29 ENCOUNTER — Ambulatory Visit (INDEPENDENT_AMBULATORY_CARE_PROVIDER_SITE_OTHER): Payer: Self-pay | Admitting: Cardiovascular Disease

## 2011-08-29 ENCOUNTER — Encounter: Payer: Self-pay | Admitting: Cardiovascular Disease

## 2011-08-29 DIAGNOSIS — I071 Rheumatic tricuspid insufficiency: Secondary | ICD-10-CM

## 2011-08-29 DIAGNOSIS — I079 Rheumatic tricuspid valve disease, unspecified: Secondary | ICD-10-CM

## 2011-08-29 DIAGNOSIS — R002 Palpitations: Secondary | ICD-10-CM | POA: Insufficient documentation

## 2011-08-29 DIAGNOSIS — I10 Essential (primary) hypertension: Secondary | ICD-10-CM

## 2011-08-29 HISTORY — DX: Palpitations: R00.2

## 2011-08-29 LAB — FECAL LACTOFERRIN, QUANT: Lactoferrin: NEGATIVE

## 2011-08-29 NOTE — Assessment & Plan Note (Signed)
The patient has frequent palpitations with multiple associated symptoms. I have recommended that she undergo a 48-hour Holter monitor for further evaluation.

## 2011-08-29 NOTE — Assessment & Plan Note (Signed)
I reviewed the patient's echocardiogram images. She does have moderate tricuspid regurgitation with borderline pulmonary pressures based on estimation from her tricuspid regurgitation velocities. I'm not convinced this finding is related to any of her symptoms and she does not have evidence of right heart volume overload. I have recommended that we repeat an echo at 12 month interval to reassess her tricuspid valve and make sure there is not progressive TR. Based on her physical exam I suspect that this will be stable.

## 2011-08-29 NOTE — Patient Instructions (Signed)
Your physician has recommended that you wear a 48 holter monitor. Holter monitors are medical devices that record the heart's electrical activity. Doctors most often use these monitors to diagnose arrhythmias. Arrhythmias are problems with the speed or rhythm of the heartbeat. The monitor is a small, portable device. You can wear one while you do your normal daily activities. This is usually used to diagnose what is causing palpitations/syncope (passing out).  Your physician has requested that you have an echocardiogram in [place]. Echocardiography is a painless test that uses sound waves to create images of your heart. It provides your doctor with information about the size and shape of your heart and how well your heart's chambers and valves are working. This procedure takes approximately one hour. There are no restrictions for this procedure.  Your physician recommends that you schedule a follow-up appointment in November.  Your physician recommends that you continue on your current medications as directed. Please refer to the Current Medication list given to you today.

## 2011-08-29 NOTE — Assessment & Plan Note (Signed)
Blood pressure elevated today. We reviewed some home readings and they have been in the low range. I suspect anxiety plays a role here as she seems to have some lability to her blood pressure. Will not make any medication changes today.

## 2011-08-29 NOTE — Progress Notes (Signed)
HPI:  This is a 61 year old woman presenting for followup evaluation.  The patient was last seen in 2010 when she was evaluated for chest pain. She underwent a stress echo which showed no significant abnormalities. Over the past year she has complained of increasing pain in the epigastric region with pain radiating to the chest and left arm. She complains of a squeezing sensation. Her pain is nonexertional and she has undergone a GI evaluation demonstrating a hiatal hernia. She complains of episodes of palpitations with associated dyspnea and diaphoresis. She had an echocardiogram done in November demonstrating moderate tricuspid regurgitation and she has been extremely concerned about this. She denies orthopnea, PND, or lower extremity edema.  Outpatient Encounter Prescriptions as of 08/29/2011  Medication Sig Dispense Refill  . albuterol (PROVENTIL HFA;VENTOLIN HFA) 108 (90 BASE) MCG/ACT inhaler Inhale 2 puffs into the lungs every 6 (six) hours as needed for wheezing.  18 g  0  . alprazolam (XANAX) 2 MG tablet Take 2 mg by mouth at bedtime as needed.      Marland Kitchen amLODipine (NORVASC) 5 MG tablet Take 5 mg by mouth daily.        . Calcium Carbonate (CALCIUM 600 PO) Take by mouth.        . Cholecalciferol (VITAMIN D) 2000 UNITS CAPS Take by mouth.        . lactobacillus acidophilus & bulgar (LACTINEX) chewable tablet Chew 2 tablets by mouth 3 (three) times daily with meals.  180 tablet  0  . LORazepam (ATIVAN) 2 MG tablet Take 1 tablet (2 mg total) by mouth every 8 (eight) hours as needed for anxiety.  90 tablet  0  . ranitidine (ZANTAC) 300 MG tablet Take 1 tablet (300 mg total) by mouth at bedtime.  30 tablet  1  . DISCONTD: CARAFATE 1 GM/10ML suspension TAKE TWO TEASPOONSFUL BY MOUTH 4 TIMES DAILY  420 mL  1  . DISCONTD: clidinium-chlordiazePOXIDE (LIBRAX) 2.5-5 MG per capsule Take one capsule by mouth 3 times daily when needed for abdominal pain.  60 capsule  1  . DISCONTD: fish oil-omega-3 fatty acids  1000 MG capsule Take 2 g by mouth daily.        Marland Kitchen DISCONTD: fluconazole (DIFLUCAN) 150 MG tablet 1 tab po weekly x 4 weeks  4 tablet  1  . DISCONTD: metroNIDAZOLE (FLAGYL) 500 MG tablet Take 1 tablet (500 mg total) by mouth 3 (three) times daily. X 14 days  42 tablet  0    Allergies  Allergen Reactions  . Sulfamethoxazole Shortness Of Breath    chest tightness  . Iohexol   . Iodinated Diagnostic Agents     Pt states at Dr.Grapeys office 15 years ago blacked out for 2 hours during injection for IVP. Was told never to have IV dye again.     Past Medical History  Diagnosis Date  . GERD (gastroesophageal reflux disease)   . Depression   . Anxiety   . Insomnia   . IBS (irritable bowel syndrome)   . Diverticulosis   . Barrett esophagus   . Hiatal hernia   . Unspecified menopausal and postmenopausal disorder   . Vitamin B12 deficiency   . Blind loop syndrome   . Hepatic cyst   . Unspecified hypothyroidism   . Diarrhea   . Family history of malignant neoplasm of gastrointestinal tract     multiple members  . Endometrial hyperplasia 02/27/2006    BENIGN ENDO BX ON 02/2007  . C. difficile diarrhea   .  Osteoporosis 05/2011    -2.5 Lt femoral neck  . Cystocele, midline   . Rectocele   . Uterine prolapse without mention of vaginal wall prolapse   . Chest pain, atypical 07/14/2011  . Leaky heart valve   . Hiatal hernia   . 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 Thrush 07/21/2011    ROS: Negative except as per HPI  BP 160/80  Pulse 84  Ht 5\' 5"  (1.651 m)  Wt 73.392 kg (161 lb 12.8 oz)  BMI 26.92 kg/m2  PHYSICAL EXAM: Pt is alert and oriented, NAD HEENT: normal Neck: JVP - normal, carotids 2+= without bruits Lungs: CTA bilaterally CV: RRR with grade 2/6 soft systolic murmur at the left lower sternal border Abd: soft, NT, Positive BS, no hepatomegaly Ext: no C/C/E, distal pulses intact and equal Skin: warm/dry no rash  EKG:  EKG dated 06/30/2011 shows normal sinus rhythm  with right bundle branch block morphology and isolated PAC. This is unchanged from her 2010 tracing.  ASSESSMENT AND PLAN:

## 2011-08-30 LAB — CLOSTRIDIUM DIFFICILE EIA: CDIFTX: NEGATIVE

## 2011-08-31 LAB — STOOL CULTURE

## 2011-09-01 ENCOUNTER — Telehealth: Payer: Self-pay | Admitting: *Deleted

## 2011-09-01 NOTE — Progress Notes (Signed)
Subjective:    Patient ID: Megan [name], female    DOB: [birth year], 61 y.o.   MRN: [id number]  HPI Megan [name] is a 61yo Female that has history of barrett's esophagus, GERD, hiatal hernia who has had chronic diarrhea since July 2012. She was found to have community acquired C.Difficile infection since she denied having any recent hospitalization, nor any antibiotic usage. She was last seen at our clinic in 07/29/11. At that time the patient complained of still having 5-6 liquid stools. We empirically treated with another course of po vancomycin 125mg  QID. Initially, I wanted to try to do a trial of nitaxozanide but it was cost prohibitive for the patient. She states that she was able to do a weeks worth of treatment but felt poorly,--malaise, and anorexia. She tapered herself off of the medication, and her stools are now somewhat formed but still has 4-6 bowel mvmts per day.  She has an extensive c.difficile treatment history - refer back to clinic note on 12/11  She predominantly has upper GI symptoms, where she complains of frequent belching, acid reflux,chest discomfort after eating. She denies abdominal cramping associated with diarrhea, no blood noted in her stool. She does have occasisonal flatulence with her loose stools. She is predominantly worried that she is unable to undergo upcoming evaluation/surgery for rectal/vaginal prolapse if she still has c.difficile.  She denies fever,chills, nightsweats. Despite having loose stools, she is able to stay hydrated and eat consistently. Active Ambulatory Problems    Diagnosis Date Noted  . BASAL CELL CARCINOMA, FACE 11/15/2009  . HYPOTHYROIDISM 10/23/2008  . VITAMIN B12 DEFICIENCY 02/15/2009  . ANXIETY DEPRESSION 12/14/2007  . MIGRAINE HEADACHE 11/15/2009  . HYPERTENSION 01/04/2009  . GERD 01/26/2009  . DIVERTICULOSIS-COLON 01/26/2009  . 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 INSOMNIA 12/14/2007  . WEIGHT GAIN 06/05/2009  . DIARRHEA,  CHRONIC 12/05/2008  . HYPERGLYCEMIA 06/05/2009  . BASAL CELL CARCINOMA, FACE 11/15/2009  . FLATULENCE-GAS-BLOATING 01/26/2009  . POSTMENOPAUSAL SYNDROME 09/10/2010  . Myositis 09/25/2010  . Chest pain 12/13/2010  . Anxiety disorder 12/13/2010  . Pleuritic chest pain 12/22/2010  . Barrett's esophagus 03/04/2011  . Hiatal hernia 03/04/2011  . C. difficile colitis 04/24/2011  . IBS (irritable bowel syndrome) 04/24/2011  . Chronic diarrhea 04/24/2011  . C. difficile diarrhea 05/08/2011  . Endometrial hyperplasia   . Diverticulosis   . Abdominal pain 06/17/2011  . GERD (gastroesophageal reflux disease) 06/17/2011  . Clostridium difficile enterocolitis 06/17/2011  . Chest pain, atypical 07/14/2011  . 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 Mitral regurgitation 07/21/2011  . Thrush 07/21/2011  . Palpitations 08/29/2011   Resolved Ambulatory Problems    Diagnosis Date Noted  . Other specified disorders of liver 09/14/2008  . Edema 10/23/2008   Past Medical History  Diagnosis Date  . Depression   . Anxiety   . Insomnia   . Barrett esophagus   . Vitamin B12 deficiency   . Hepatic cyst   . Family history of malignant neoplasm of gastrointestinal tract   . Osteoporosis 05/2011  . Cystocele, midline   . Rectocele   . Uterine prolapse without mention of vaginal wall prolapse   . Leaky heart valve    Prior to Admission medications   Medication Sig Start Date End Date Taking? Authorizing Provider  albuterol (PROVENTIL HFA;VENTOLIN HFA) 108 (90 BASE) MCG/ACT inhaler Inhale 2 puffs into the lungs every 6 (six) hours as needed for wheezing. 03/14/11 03/13/12 Yes Damian Leavell.  amLODipine (NORVASC) 5 MG tablet Take  5 mg by mouth daily.     Yes Historical Provider, MD  Calcium Carbonate (CALCIUM 600 PO) Take by mouth.     Yes Historical Provider, MD  Cholecalciferol (VITAMIN D) 2000 UNITS CAPS Take by mouth.     Yes Historical Provider, MD  lactobacillus acidophilus & bulgar  (LACTINEX) chewable tablet Chew 2 tablets by mouth 3 (three) times daily with meals. 08/15/11  Yes Judyann Munson, MD  LORazepam (ATIVAN) 2 MG tablet Take 1 tablet (2 mg total) by mouth every 8 (eight) hours as needed for anxiety. 06/30/11  Yes Danise Edge, MD  ranitidine (ZANTAC) 300 MG tablet Take 1 tablet (300 mg total) by mouth at bedtime. 06/30/11 06/29/12 Yes Danise Edge, MD  alprazolam Prudy Feeler) 2 MG tablet Take 2 mg by mouth at bedtime as needed.    Historical Provider, MD   Allergies  Allergen Reactions  . Sulfamethoxazole Shortness Of Breath    chest tightness  . Iohexol   . Iodinated Diagnostic Agents     Pt states at Dr.Grapeys office 15 years ago blacked out for 2 hours during injection for IVP. Was told never to have IV dye again.    History  Substance Use Topics  . Smoking status: Former Smoker    Quit date: 08/19/1995  . Smokeless tobacco: Never Used  . Alcohol Use: No  family history includes Breast cancer in her maternal grandmother and paternal grandmother; Cancer in her mother; Colon cancer in her cousin, maternal aunt, maternal grandmother, and mother; Colon polyps in her father; Diabetes in her mother and paternal grandmother; Hypertension in her mother; and Ovarian cancer in her cousin.   Review of Systems  Constitutional: Negative for fever, chills, diaphoresis, activity change, appetite change,  and unexpected weight change. Positive for fatigue HENT: Negative for congestion, sore throat, rhinorrhea, sneezing, trouble swallowing and sinus pressure.  Eyes: Negative for photophobia and visual disturbance.  Respiratory: Negative for cough, chest tightness, shortness of breath, wheezing and stridor.  Cardiovascular: Negative for chest pain, positive for palpitations and negative leg swelling.  Gastrointestinal: positive for nausea, but no vomiting, abdominal pain,  Has diarrhea, no constipation, no blood in stool,but has abdominal distention Genitourinary: Negative  for dysuria, hematuria, flank pain and difficulty urinating.  Musculoskeletal: Negative for myalgias, back pain, joint swelling, arthralgias and gait problem.  Skin: Negative for color change, pallor, rash and wound.  Neurological: Negative for dizziness, tremors, weakness and light-headedness.  Hematological: Negative for adenopathy. Does not bruise/bleed easily.  Psychiatric/Behavioral: Negative for behavioral problems, confusion, sleep disturbance, dysphoric mood, decreased concentration and agitation.       Objective:   Physical Exam BP 147/89  Pulse 84  Temp(Src) 98 F (36.7 C) (Oral)  Wt 159 lb (72.122 kg)  General Appearance:    Alert, cooperative, no distress, appears stated age  Head:    Normocephalic, without obvious abnormality, atraumatic  Eyes:    PERRL, conjunctiva/corneas clear, EOM's intact, bilaterally  Ears:    Normal TM's and external ear canals, both ears  Nose:   Nares normal, septum midline, mucosa normal, no drainage    or sinus tenderness  Throat:   Lips, mucosa, and tongue normal; teeth and gums normal  Neck:   Supple, symmetrical, trachea midline, no adenopathy;         Lungs:     Clear to auscultation bilaterally, respirations unlabored      Heart:    Regular rate and rhythm, S1 and S2 normal, no murmur, rub   or  gallop     Abdomen:     Soft, non-tender, bowel sounds active all four quadrants,    no masses, no organomegaly        Extremities:   Extremities normal, atraumatic, no cyanosis or edema  Pulses:   2+ and symmetric all extremities  Skin:   Skin color, texture, turgor normal, no rashes or lesions  Lymph nodes:   Cervical, supraclavicular, and axillary nodes normal     Old Labs: CBC    Component Value Date/Time   WBC 7.2 07/29/2011 1515   RBC 4.51 07/29/2011 1515   HGB 13.8 07/29/2011 1515   HCT 41.1 07/29/2011 1515   PLT 325 07/29/2011 1515   MCV 91.1 07/29/2011 1515   MCH 30.6 07/29/2011 1515   MCHC 33.6 07/29/2011 1515   RDW  13.0 07/29/2011 1515   LYMPHSABS 2.8 07/29/2011 1515   MONOABS 0.6 07/29/2011 1515   EOSABS 0.2 07/29/2011 1515   BASOSABS 0.0 07/29/2011 1515   Micro:(addendum) 08/27/11: cdifficile negative 08/27/11: stool culture - normal    Assessment & Plan:  61yo Female with chronic diarrhea and history of recurrent c.difficile infection presents with ongoing diarrhea but stools are starting to become formed per patient report  1) c.difficile vs. Non-infectious causes of diarrhea = we will check c.difficile pcr assay on her stool to see if she is symptomatic from this infection vs. Having diarrhea from non-infectious causes. We will also check stool culture.  If c.difficile negative, we will ask patient to try bulking agents such as metamucil or can do immodium twice a day to see if it improves her symptoms. Can refer back to PCP for management of chronic diarrhea.  If positive for c.difficile, will consider fecal bacteriotherapy if not responding to PO vancomycin.   Will send copy to her pcp

## 2011-09-01 NOTE — Telephone Encounter (Signed)
Pt called and said she is ready to schedule her reclast appointment. She also mentioned talking with you about doing a rectal wall repair? Please advise

## 2011-09-01 NOTE — Telephone Encounter (Signed)
Okay to make arrangements for Reclast. She's having urinary incontinence and was considering a sling with the urologist. I talked about doing a hysterectomy and posterior repair if they were going to do the anterior repair.

## 2011-09-01 NOTE — Telephone Encounter (Signed)
Lm for pt to call

## 2011-09-02 ENCOUNTER — Ambulatory Visit: Payer: Self-pay | Admitting: Internal Medicine

## 2011-09-04 ENCOUNTER — Telehealth: Payer: Self-pay

## 2011-09-04 MED ORDER — DILTIAZEM HCL ER COATED BEADS 180 MG PO CP24: 180.0000 mg | ORAL_CAPSULE | Freq: Every day | ORAL | Status: DC

## 2011-09-04 NOTE — Telephone Encounter (Signed)
Dr Excell Seltzer called this pt about the results of heart monitor.  Dr Excell Seltzer recommended that the pt stop amlodipine and start Cardizem CD 180mg  daily.  The pt would like this Rx sent to the [facility] pharmacy.

## 2011-09-10 ENCOUNTER — Encounter: Payer: Self-pay | Admitting: Family Medicine

## 2011-09-10 ENCOUNTER — Institutional Professional Consult (permissible substitution): Payer: Self-pay | Admitting: Gynecology

## 2011-09-10 ENCOUNTER — Telehealth: Payer: Self-pay | Admitting: *Deleted

## 2011-09-10 ENCOUNTER — Ambulatory Visit (INDEPENDENT_AMBULATORY_CARE_PROVIDER_SITE_OTHER): Payer: Self-pay | Admitting: Family Medicine

## 2011-09-10 VITALS — BP 150/82 | HR 80 | Temp 99.6°F | Ht 65.0 in | Wt 160.8 lb

## 2011-09-10 DIAGNOSIS — F419 Anxiety disorder, unspecified: Secondary | ICD-10-CM

## 2011-09-10 DIAGNOSIS — F341 Dysthymic disorder: Secondary | ICD-10-CM

## 2011-09-10 DIAGNOSIS — R002 Palpitations: Secondary | ICD-10-CM

## 2011-09-10 DIAGNOSIS — A0472 Enterocolitis due to Clostridium difficile, not specified as recurrent: Secondary | ICD-10-CM

## 2011-09-10 DIAGNOSIS — J029 Acute pharyngitis, unspecified: Secondary | ICD-10-CM

## 2011-09-10 DIAGNOSIS — I10 Essential (primary) hypertension: Secondary | ICD-10-CM

## 2011-09-10 DIAGNOSIS — F411 Generalized anxiety disorder: Secondary | ICD-10-CM

## 2011-09-10 MED ORDER — ALPRAZOLAM 2 MG PO TABS: ORAL_TABLET | ORAL | Status: DC

## 2011-09-10 MED ORDER — AZITHROMYCIN 250 MG PO TABS: ORAL_TABLET | ORAL | Status: AC

## 2011-09-10 NOTE — Patient Instructions (Signed)

## 2011-09-10 NOTE — Telephone Encounter (Signed)
Patient called back.  She said she just saw PCP this morning and they advised her not to go with Reclast.  Told patient to keep appt with Dr. Velvet Bathe and discuss with him.

## 2011-09-10 NOTE — Telephone Encounter (Signed)
Lm for patient to call to get labs set up again for Reclast.  Had cancelled when she was sick and need to reschedule.

## 2011-09-11 NOTE — Progress Notes (Signed)
Patient ID: Megan [name], female   DOB: 1950/11/21, 61 y.o.   MRN: [id number] Megan [name] [id number] 05-20-1951 09/11/2011      Progress Note-Follow Up  Subjective  Chief Complaint  Chief Complaint  Patient presents with  . Follow-up    1 month follow up/ after taking Vancomycine    HPI  Patient is a 61 yo Caucasian female in today for follow up. She has recently seen cardiology and been given an rx forCardizem but has not picked it up. Her palpitations are slightly better than at her last visit but still present. No CP/sob/diaphoresis. She has also been seen by ID and her CDiff has been treated with Vancomycin again and her diarrhea is somewhat better. She continues with the Lactinex. No abdominal pain, anorexia, fevers. Moving bowels comfortably  Past Medical History  Diagnosis Date  . GERD (gastroesophageal reflux disease)   . Depression   . Anxiety   . Insomnia   . IBS (irritable bowel syndrome)   . Diverticulosis   . Barrett esophagus   . Hiatal hernia   . Unspecified menopausal and postmenopausal disorder   . Vitamin B12 deficiency   . Blind loop syndrome   . Hepatic cyst   . Unspecified hypothyroidism   . Diarrhea   . Family history of malignant neoplasm of gastrointestinal tract     multiple members  . Endometrial hyperplasia 02/27/2006    BENIGN ENDO BX ON 02/2007  . C. difficile diarrhea   . Osteoporosis 05/2011    -2.5 Lt femoral neck  . Cystocele, midline   . Rectocele   . Uterine prolapse without mention of vaginal wall prolapse   . Chest pain, atypical 07/14/2011  . Leaky heart valve   . Hiatal hernia   . 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 Thrush 07/21/2011    Past Surgical History  Procedure Date  . Appendectomy   . Uterine fibroid surgery   . Hysteroscopy     POLYP    Family History  Problem Relation Age of Onset  . Colon cancer Mother   . Diabetes Mother   . Cancer Mother     colon  . Hypertension Mother   . Colon polyps  Father   . Colon cancer Maternal Aunt   . Colon cancer Maternal Grandmother   . Breast cancer Maternal Grandmother   . Diabetes Paternal Grandmother   . Breast cancer Paternal Grandmother   . Colon cancer Cousin     X3  . Ovarian cancer Cousin     History   Social History  . Marital Status: Married    Spouse Name: N/A    Number of Children: 4  . Years of Education: N/A   Occupational History  . Retired    Social History Main Topics  . Smoking status: Former Smoker    Quit date: 08/19/1995  . Smokeless tobacco: Never Used  . Alcohol Use: No  . Drug Use: No  . Sexually Active: No   Other Topics Concern  . Not on file   Social History Narrative  . No narrative on file    Current Outpatient Prescriptions on File Prior to Visit  Medication Sig Dispense Refill  . albuterol (PROVENTIL HFA;VENTOLIN HFA) 108 (90 BASE) MCG/ACT inhaler Inhale 2 puffs into the lungs every 6 (six) hours as needed for wheezing.  18 g  0  . Calcium Carbonate (CALCIUM 600 PO) Take by mouth.        . Cholecalciferol (VITAMIN D) 2000  UNITS CAPS Take by mouth.        . diltiazem (CARDIZEM CD) 180 MG 24 hr capsule Take 1 capsule (180 mg total) by mouth daily.  90 capsule  3  . lactobacillus acidophilus & bulgar (LACTINEX) chewable tablet Chew 2 tablets by mouth 3 (three) times daily with meals.  180 tablet  0  . LORazepam (ATIVAN) 2 MG tablet Take 1 tablet (2 mg total) by mouth every 8 (eight) hours as needed for anxiety.  90 tablet  0  . ranitidine (ZANTAC) 300 MG tablet Take 1 tablet (300 mg total) by mouth at bedtime.  30 tablet  1    Allergies  Allergen Reactions  . Sulfamethoxazole Shortness Of Breath    chest tightness  . Iohexol   . Iodinated Diagnostic Agents     Pt states at Dr.Grapeys office 15 years ago blacked out for 2 hours during injection for IVP. Was told never to have IV dye again.     Review of Systems  Review of Systems  Constitutional: Negative for fever and  malaise/fatigue.  HENT: Negative for congestion.   Eyes: Negative for discharge.  Respiratory: Negative for shortness of breath.   Cardiovascular: Negative for chest pain, palpitations and leg swelling.  Gastrointestinal: Negative for nausea, abdominal pain and diarrhea.  Genitourinary: Negative for dysuria.  Musculoskeletal: Negative for falls.  Skin: Negative for rash.  Neurological: Negative for loss of consciousness and headaches.  Endo/Heme/Allergies: Negative for polydipsia.  Psychiatric/Behavioral: Negative for depression and suicidal ideas. The patient is not nervous/anxious and does not have insomnia.     Objective  BP 150/82  Pulse 80  Temp(Src) 99.6 F (37.6 C) (Temporal)  Ht 5\' 5"  (1.651 m)  Wt 160 lb 12.8 oz (72.938 kg)  BMI 26.76 kg/m2  SpO2 98%  Physical Exam  Physical Exam  Constitutional: She is oriented to person, place, and time and well-developed, well-nourished, and in no distress. No distress.  HENT:  Head: Normocephalic and atraumatic.  Eyes: Conjunctivae are normal.  Neck: Neck supple. No thyromegaly present.  Cardiovascular: Normal rate, regular rhythm and normal heart sounds.   No murmur heard. Pulmonary/Chest: Effort normal and breath sounds normal. She has no wheezes.  Abdominal: She exhibits no distension and no mass.  Musculoskeletal: She exhibits no edema.  Lymphadenopathy:    She has no cervical adenopathy.  Neurological: She is alert and oriented to person, place, and time.  Skin: Skin is warm and dry. No rash noted. She is not diaphoretic.  Psychiatric: Memory, affect and judgment normal.    Lab Results  Component Value Date   TSH 0.66 12/13/2010   Lab Results  Component Value Date   WBC 7.2 07/29/2011   HGB 13.8 07/29/2011   HCT 41.1 07/29/2011   MCV 91.1 07/29/2011   PLT 325 07/29/2011   Lab Results  Component Value Date   CREATININE 0.61 07/29/2011   BUN 8 07/29/2011   NA 141 07/29/2011   K 4.2 07/29/2011   CL 103  07/29/2011   CO2 27 07/29/2011   Lab Results  Component Value Date   ALT 21 07/29/2011   AST 23 07/29/2011   ALKPHOS 89 07/29/2011   BILITOT 0.4 07/29/2011   Lab Results  Component Value Date   CHOL 218* 06/30/2011   Lab Results  Component Value Date   HDL 55.80 06/30/2011   No results found for this basename: [facility]   Lab Results  Component Value Date   TRIG 80.0 06/30/2011  Lab Results  Component Value Date   CHOLHDL 4 06/30/2011     Assessment & Plan   Palpitations Was just given a prescription for diltiazem by her cardiologist but has not picked up yet. Her blood pressure is up slightly today. She is asked to go pick up her prescription and started reassess at next visit.  HYPERTENSION Avoid sodium and start Cardizem.  C. difficile diarrhea Has been seen by infectious disease now and has tested negative for C. difficile again. Bowels are beginning to improve encouraged daily probiotic and fiber supplements  ANXIETY DEPRESSION Declines any new medications today but will will use alprazolam twice a day until the next visit and reassess her

## 2011-09-11 NOTE — Telephone Encounter (Signed)
Pt had appointment for reclast,then canceled appointment.

## 2011-09-14 NOTE — Assessment & Plan Note (Signed)
Was just given a prescription for diltiazem by her cardiologist but has not picked up yet. Her blood pressure is up slightly today. She is asked to go pick up her prescription and started reassess at next visit.

## 2011-09-14 NOTE — Assessment & Plan Note (Signed)
Declines any new medications today but will will use alprazolam twice a day until the next visit and reassess her

## 2011-09-14 NOTE — Assessment & Plan Note (Signed)
Has been seen by infectious disease now and has tested negative for C. difficile again. Bowels are beginning to improve encouraged daily probiotic and fiber supplements

## 2011-09-14 NOTE — Assessment & Plan Note (Signed)
Avoid sodium and start Cardizem.

## 2011-09-16 ENCOUNTER — Encounter: Payer: Self-pay | Admitting: *Deleted

## 2011-09-16 ENCOUNTER — Ambulatory Visit (INDEPENDENT_AMBULATORY_CARE_PROVIDER_SITE_OTHER): Payer: Self-pay | Admitting: Urology

## 2011-09-16 DIAGNOSIS — N3289 Other specified disorders of bladder: Secondary | ICD-10-CM

## 2011-09-16 DIAGNOSIS — R3915 Urgency of urination: Secondary | ICD-10-CM

## 2011-09-16 DIAGNOSIS — N3946 Mixed incontinence: Secondary | ICD-10-CM

## 2011-09-16 NOTE — Progress Notes (Signed)
Patient ID: Megan [name], female   DOB: 1950-09-16, 61 y.o.   MRN: [id number] Pt called wanting to talk with TF about what her pcp told regarding starting on bone medication for osteoporosis. Left message on pt vm to make o.v with TF discuss

## 2011-09-24 ENCOUNTER — Telehealth: Payer: Self-pay

## 2011-09-24 NOTE — Telephone Encounter (Signed)
Pt left a message stating that during her last appt the md and her had discussed going on antidepressants? Pt stated that she didn't start anything then but is ready to be put on something now? Pt stated that her depression gets worse and worse everyday and would like to be put on a mood stabilizer? Pt will need RX to be printed because her new insurance makes her personally bring an RX to the pharmacy? Please advise?

## 2011-09-24 NOTE — Telephone Encounter (Signed)
OK to give rx for Citalopram 10 mg tabs 1 tab po daily, # 30 with 2 refills and then she has to be seen in roughly 4 weeks to reassess. It will take several weeks to really have a notable effect so she should take it routinely as long as she does not have any concerning symptoms. Can have some GI upset but usually gets better in a week or 2. It is not expensive

## 2011-09-25 MED ORDER — CITALOPRAM HYDROBROMIDE 10 MG PO TABS: 10.0000 mg | ORAL_TABLET | Freq: Every day | ORAL | Status: DC

## 2011-09-25 NOTE — Telephone Encounter (Signed)
Pt informed and RX sent to [facility]

## 2011-10-03 ENCOUNTER — Telehealth: Payer: Self-pay | Admitting: *Deleted

## 2011-10-03 ENCOUNTER — Encounter (HOSPITAL_COMMUNITY): Payer: Self-pay | Admitting: *Deleted

## 2011-10-03 ENCOUNTER — Emergency Department (HOSPITAL_COMMUNITY): Payer: Self-pay

## 2011-10-03 ENCOUNTER — Emergency Department (HOSPITAL_COMMUNITY)
Admission: EM | Admit: 2011-10-03 | Discharge: 2011-10-03 | Disposition: A | Discharge status: Home / Self Care | Payer: Self-pay | Attending: Emergency Medicine | Admitting: Emergency Medicine

## 2011-10-03 DIAGNOSIS — R197 Diarrhea, unspecified: Secondary | ICD-10-CM | POA: Insufficient documentation

## 2011-10-03 DIAGNOSIS — R10819 Abdominal tenderness, unspecified site: Secondary | ICD-10-CM | POA: Insufficient documentation

## 2011-10-03 DIAGNOSIS — K625 Hemorrhage of anus and rectum: Secondary | ICD-10-CM | POA: Insufficient documentation

## 2011-10-03 DIAGNOSIS — E039 Hypothyroidism, unspecified: Secondary | ICD-10-CM | POA: Insufficient documentation

## 2011-10-03 DIAGNOSIS — K602 Anal fissure, unspecified: Secondary | ICD-10-CM | POA: Insufficient documentation

## 2011-10-03 LAB — URINALYSIS, ROUTINE W REFLEX MICROSCOPIC
Bilirubin Urine: NEGATIVE
Hgb urine dipstick: NEGATIVE
Nitrite: NEGATIVE
Specific Gravity, Urine: 1.011 (ref 1.005–1.030)
Urobilinogen, UA: 0.2 mg/dL (ref 0.0–1.0)
pH: 6 (ref 5.0–8.0)

## 2011-10-03 LAB — DIFFERENTIAL
Basophils Absolute: 0 10*3/uL (ref 0.0–0.1)
Basophils Relative: 0 % (ref 0–1)
Eosinophils Absolute: 0.3 10*3/uL (ref 0.0–0.7)
Eosinophils Relative: 3 % (ref 0–5)
Monocytes Absolute: 0.6 10*3/uL (ref 0.1–1.0)

## 2011-10-03 LAB — CBC
HCT: 42.4 % (ref 36.0–46.0)
MCH: 30.9 pg (ref 26.0–34.0)
MCHC: 34.4 g/dL (ref 30.0–36.0)
MCV: 89.8 fL (ref 78.0–100.0)
Platelets: 274 10*3/uL (ref 150–400)
RDW: 12.7 % (ref 11.5–15.5)
WBC: 8.6 10*3/uL (ref 4.0–10.5)

## 2011-10-03 LAB — COMPREHENSIVE METABOLIC PANEL
ALT: 25 U/L (ref 0–35)
AST: 35 U/L (ref 0–37)
Calcium: 9.3 mg/dL (ref 8.4–10.5)
Creatinine, Ser: 0.58 mg/dL (ref 0.50–1.10)
GFR calc non Af Amer: >90 mL/min (ref >90)
Sodium: 138 mEq/L (ref 135–145)
Total Protein: 8 g/dL (ref 6.0–8.3)

## 2011-10-03 LAB — OCCULT BLOOD, POC DEVICE: Fecal Occult Bld: POSITIVE

## 2011-10-03 LAB — LIPASE, BLOOD: Lipase: 34 U/L (ref 11–59)

## 2011-10-03 MED ORDER — SODIUM CHLORIDE 0.9 % IV SOLN
1000.0000 mL | INTRAVENOUS | Status: DC
  Administered 2011-10-03 (×3): 1000 mL via INTRAVENOUS

## 2011-10-03 MED ORDER — ONDANSETRON HCL 4 MG/2ML IJ SOLN
4.0000 mg | Freq: Once | INTRAMUSCULAR | Status: AC
  Administered 2011-10-03: 4 mg via INTRAVENOUS
  Filled 2011-10-03: qty 2

## 2011-10-03 MED ORDER — SODIUM CHLORIDE 0.9 % IV BOLUS (SEPSIS): 2000.0000 mL | Freq: Once | INTRAVENOUS | Status: DC

## 2011-10-03 MED ORDER — HYDROMORPHONE HCL PF 1 MG/ML IJ SOLN
1.0000 mg | Freq: Once | INTRAMUSCULAR | Status: AC
  Administered 2011-10-03: 1 mg via INTRAVENOUS
  Filled 2011-10-03: qty 1

## 2011-10-03 NOTE — Telephone Encounter (Signed)
H/O colitis and diverticulitis.  Had bloating, cramping and diarrhea last night after eating hot dog.  Took Carafate last night, took xanax and that finally relieved cramping.  Diarrhea has subsided. Bleeding began this AM.  Pt is laying in bed now, if she moves around she will notice blood beginning to leak out and cramping will worsen.  Pt is fatigued.  Denies fever.  Pt has a family history of colon cancer and states she has never had any bleeding before. I asked pt if she spoke with scheduler or nurse at Dr. Norval Gable office.  Pt states she only spoke to scheduler.  Pt states she asked scheduler if bleeding like this is normal and was told, "I don't know, but we can't see you until Tuesday."  Pt was not offered opportunity to speak with nurse or to leave message for nurse. Advised pt to go to [facility] ED for eval.  Dr. Milinda Cave is in agreement.

## 2011-10-03 NOTE — ED Notes (Signed)
MD at bedside. 

## 2011-10-03 NOTE — ED Provider Notes (Signed)
History     CSN: [id number]  Arrival date & time 10/03/11  1424   First MD Initiated Contact with Patient 10/03/11 1634      Chief Complaint  Patient presents with  . Abdominal Pain  . Rectal Bleeding    (Consider location/radiation/quality/duration/timing/severity/associated sxs/prior treatment) Patient is a 61 y.o. female presenting with abdominal pain and hematochezia. The history is provided by the patient.  Abdominal Pain The primary symptoms of the illness include abdominal pain, diarrhea and hematochezia. The primary symptoms of the illness do not include fever or hematemesis.  The abdominal pain radiates to the LLQ and RLQ.  The diarrhea is bloody.  Rectal Bleeding  Associated symptoms include abdominal pain and diarrhea. Pertinent negatives include no fever and no hematemesis.   patient states she started having diarrhea associated with abdominal pain yesterday after having a hot dog. 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 She states she recurrently has issues with her stomach but has never had rectal bleeding like she did today. Patient states she thought she had a half cup to a cup of blood with a bowel movement. It has persisted throughout the day. She called her GI Dr. and has an appointment for Tuesday but when she spoke her primary Dr. because of the persistent symptoms she was told to come to the emergency department. Past Medical History  Diagnosis Date  . GERD (gastroesophageal reflux disease)   . Depression   . Anxiety   . Insomnia   . IBS (irritable bowel syndrome)   . Diverticulosis   . Barrett esophagus   . Hiatal hernia   . Unspecified menopausal and postmenopausal disorder   . Vitamin B12 deficiency   . Blind loop syndrome   . Hepatic cyst   . Unspecified hypothyroidism   . Diarrhea   . Family history of malignant neoplasm of gastrointestinal tract     multiple members    . Endometrial hyperplasia 02/27/2006    BENIGN ENDO BX ON 02/2007  . C. difficile diarrhea   . Osteoporosis 05/2011    -2.5 Lt femoral neck  . Cystocele, midline   . Rectocele   . Uterine prolapse without mention of vaginal wall prolapse   . Chest pain, atypical 07/14/2011  . Leaky heart valve   . Hiatal hernia   . 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 Thrush 07/21/2011    Past Surgical History  Procedure Date  . Appendectomy   . Uterine fibroid surgery   . Hysteroscopy     POLYP    Family History  Problem Relation Age of Onset  . Colon cancer Mother   . Diabetes Mother   . Cancer Mother     colon  . Hypertension Mother   . Colon polyps Father   . Colon cancer Maternal Aunt   . Colon cancer Maternal Grandmother   . Breast cancer Maternal Grandmother   . Diabetes Paternal Grandmother   . Breast cancer Paternal Grandmother   . Colon cancer Cousin     X3  . Ovarian cancer Cousin     History  Substance Use Topics  . Smoking status: Former Smoker    Quit date: 08/19/1995  . Smokeless tobacco: Never Used  . Alcohol Use: No    OB History    Grav Para Term Preterm Abortions TAB SAB Ect Mult Living   6 4   2     4       Review  of Systems  Constitutional: Negative for fever.  Gastrointestinal: Positive for abdominal pain, diarrhea and hematochezia. Negative for hematemesis.  All other systems reviewed and are negative.    Allergies  Sulfamethoxazole; Iohexol; and Iodinated diagnostic agents  Home Medications   Current Outpatient Rx  Name Route Sig Dispense Refill  . ALPRAZOLAM 2 MG PO TABS  1 tab po daily prn anxiety 30 tablet 1  . CALCIUM 600 PO Oral Take by mouth.      Marland Kitchen VITAMIN D 2000 UNITS PO CAPS Oral Take by mouth.      Marland Kitchen DILTIAZEM HCL ER COATED BEADS 180 MG PO CP24 Oral Take 1 capsule (180 mg total) by mouth daily. 90 capsule 3  . LACTINEX PO CHEW Oral Chew 2 tablets by mouth 3 (three) times daily with meals. 180 tablet 0  . LORAZEPAM 2 MG PO TABS  Oral Take 1 tablet (2 mg total) by mouth every 8 (eight) hours as needed for anxiety. 90 tablet 0  . RANITIDINE HCL 300 MG PO TABS Oral Take 1 tablet (300 mg total) by mouth at bedtime. 30 tablet 1    BP 161/68  Pulse 75  Temp(Src) 98.8 F (37.1 C) (Oral)  Resp 20  SpO2 100%  Physical Exam  Nursing note and vitals reviewed. Constitutional: She appears well-developed and well-nourished. No distress.  HENT:  Head: Normocephalic and atraumatic.  Right Ear: External ear normal.  Left Ear: External ear normal.  Eyes: Conjunctivae are normal. Right eye exhibits no discharge. Left eye exhibits no discharge. No scleral icterus.  Neck: Neck supple. No tracheal deviation present.  Cardiovascular: Normal rate, regular rhythm and intact distal pulses.   Pulmonary/Chest: Effort normal and breath sounds normal. No stridor. No respiratory distress. She has no wheezes. She has no rales.  Abdominal: Soft. Bowel sounds are normal. She exhibits no distension, no ascites and no mass. There is tenderness in the right lower quadrant and left lower quadrant. There is no rigidity, no rebound and no guarding.  Genitourinary: Rectal exam shows fissure.       Anal fissure noted superior portion  Musculoskeletal: She exhibits no edema and no tenderness.  Neurological: She is alert. She has normal strength. No sensory deficit. Cranial nerve deficit:  no gross defecits noted. She exhibits normal muscle tone. She displays no seizure activity. Coordination normal.  Skin: Skin is warm and dry. No rash noted.  Psychiatric: She has a normal mood and affect.    ED Course  Procedures (including critical care time)   Labs Reviewed  CBC  DIFFERENTIAL  COMPREHENSIVE METABOLIC PANEL  URINALYSIS, ROUTINE W REFLEX MICROSCOPIC  LIPASE, BLOOD  OCCULT BLOOD, POC DEVICE   Ct Abdomen Pelvis Wo Contrast  10/03/2011  *RADIOLOGY REPORT*  Clinical Data: Abdominal pain  CT ABDOMEN AND PELVIS WITHOUT CONTRAST  Technique:   Multidetector CT imaging of the abdomen and pelvis was performed following the standard protocol without intravenous contrast.  Comparison: Abdomen pelvis CT 02/11/2011 of the  Findings: No acute or significant findings in the visualized lung bases.  Stable 1.1 cm cyst in the lateral segment of the left hepatic lobe. Remainder of the liver is unremarkable on noncontrast imaging. There is no biliary ductal dilatation.  The spleen, gallbladder, left kidney, pancreas, and right adrenal gland appear normal.  Tiny focal cortical calcifications in the upper pole of the right kidney laterally is stable.  No definite urinary tract calculi.  There is no hydronephrosis or perinephric stranding.  Both ureters are normal  in caliber.  No ureteral stone.  The urinary bladder is normal.  Oral contrast is seen within the normal appearing stomach. Majority of the oral contrast administered is within the stomach, with a small amount and some of the proximal small bowel loops. Small bowel loops are normal in caliber and wall thickness.  The colon is normal in caliber.  No colonic wall thickening is seen. Rectum is normal.  No mesenteric inflammatory changes.  Negative for lymphadenopathy, ascites, or free air.  A definite appendix is not seen.  No pericecal inflammatory changes present.  Uterus is retroverted and within normal limits for size and stable. There is no adnexal mass. Trace amount of probable free fluid in the cul-de-sac is likely physiologic and is unchanged to the prior study.  Inguinal regions are unremarkable.  Normal appearance of the bones.  IMPRESSION: No acute findings in the abdomen or pelvis.  Negative for lymphadenopathy or mass.  Original Report Authenticated By: Britta Mccreedy, M.D.   Dg Abd Acute W/chest  10/03/2011  *RADIOLOGY REPORT*  Clinical Data: Abdominal pain and rectal bleeding  ACUTE ABDOMEN SERIES (ABDOMEN 2 VIEW & CHEST 1 VIEW)  Comparison: 02/11/11  Findings:  Heart size appears normal.   No  pleural effusion or pulmonary edema.  There is no airspace consolidation identified.  The bowel gas pattern appears nonobstructed.  No dilated loops of small bowel or air-fluid levels identified.  No abnormal abdominal or pelvic calcifications noted.  IMPRESSION:  1.  Nonobstructive bowel gas pattern. 2.  No acute cardiopulmonary abnormalities.  Original Report Authenticated By: Rosealee Albee, M.D.      MDM  Patient has been having rectal bleeding associated with diarrhea. On exam she does appear to have an anal fissure. At this time she's not had any significant bleeding. Her hemoglobin and hematocrit are stable. Patient does have a plan for outpatient follow up with her GI doctor. At this point this appears reasonable as she does not show any evidence of any hemodynamic instability and it is possible that the fissure has been causing the bleeding.        Celene Kras, MD 10/03/11 [phone]

## 2011-10-03 NOTE — ED Notes (Signed)
Pt in c/o abd pain with diarrhea after eating a hot dog, today noted rectal bleeding, states blood is bright red

## 2011-10-03 NOTE — Telephone Encounter (Signed)
VM left by patient stating she had a hot dog last night and this morning she passed 1/4 cup of bright red blood rectally.  States in message if she moves she can feel more trickle out.  She has called Dr.Patterson's office and can not be seen until next Tuesday.  I do not see a triage note from that office.  RC to patient.  Message left to return call at number [phone].

## 2011-10-03 NOTE — Telephone Encounter (Signed)
Agree.-PM 

## 2011-10-03 NOTE — ED Notes (Signed)
Patient is resting comfortably. 

## 2011-10-03 NOTE — ED Notes (Signed)
Vital signs stable. 

## 2011-10-06 ENCOUNTER — Telehealth: Payer: Self-pay | Admitting: *Deleted

## 2011-10-06 NOTE — Telephone Encounter (Signed)
Called pt because I noticed she had went to see Dr Karilyn Cota back in November after reading the note I wanted to see if she had decided to change GI care she went on to tell me that she had no problem with Dr Jarold Motto but she was not happy with the way his nurse talked to her and that she was really sick and needed help and was just trying to get some help. So she went to get another opinion, but they didn't do second opinions so her Primary care MD sent her to infectious disease. She has since been treated but is now having some rectal bleeding but she will call her PCP and have her refer her somewhere else bc she does not want to come back here and have to deal with Aram Beecham

## 2011-10-07 ENCOUNTER — Ambulatory Visit: Payer: Self-pay | Admitting: Gastroenterology

## 2011-10-07 ENCOUNTER — Other Ambulatory Visit: Payer: Self-pay

## 2011-10-07 DIAGNOSIS — R1013 Epigastric pain: Secondary | ICD-10-CM

## 2011-10-07 MED ORDER — RANITIDINE HCL 300 MG PO TABS: 300.0000 mg | ORAL_TABLET | Freq: Every day | ORAL | Status: DC

## 2011-10-10 ENCOUNTER — Ambulatory Visit (INDEPENDENT_AMBULATORY_CARE_PROVIDER_SITE_OTHER): Payer: Self-pay | Admitting: Family Medicine

## 2011-10-10 ENCOUNTER — Encounter: Payer: Self-pay | Admitting: Family Medicine

## 2011-10-10 DIAGNOSIS — K449 Diaphragmatic hernia without obstruction or gangrene: Secondary | ICD-10-CM

## 2011-10-10 DIAGNOSIS — F411 Generalized anxiety disorder: Secondary | ICD-10-CM

## 2011-10-10 DIAGNOSIS — A0472 Enterocolitis due to Clostridium difficile, not specified as recurrent: Secondary | ICD-10-CM

## 2011-10-10 DIAGNOSIS — I10 Essential (primary) hypertension: Secondary | ICD-10-CM

## 2011-10-10 DIAGNOSIS — F419 Anxiety disorder, unspecified: Secondary | ICD-10-CM

## 2011-10-10 DIAGNOSIS — K219 Gastro-esophageal reflux disease without esophagitis: Secondary | ICD-10-CM

## 2011-10-10 DIAGNOSIS — K625 Hemorrhage of anus and rectum: Secondary | ICD-10-CM | POA: Insufficient documentation

## 2011-10-10 DIAGNOSIS — K602 Anal fissure, unspecified: Secondary | ICD-10-CM

## 2011-10-10 DIAGNOSIS — Z8619 Personal history of other infectious and parasitic diseases: Secondary | ICD-10-CM

## 2011-10-10 DIAGNOSIS — F341 Dysthymic disorder: Secondary | ICD-10-CM

## 2011-10-10 DIAGNOSIS — R109 Unspecified abdominal pain: Secondary | ICD-10-CM

## 2011-10-10 HISTORY — DX: Hemorrhage of anus and rectum: K62.5

## 2011-10-10 MED ORDER — HYDROCORTISONE ACETATE 25 MG RE SUPP: 25.0000 mg | Freq: Two times a day (BID) | RECTAL | Status: AC | PRN

## 2011-10-10 MED ORDER — ALPRAZOLAM 2 MG PO TABS: 2.0000 mg | ORAL_TABLET | Freq: Two times a day (BID) | ORAL | Status: DC | PRN

## 2011-10-10 NOTE — Assessment & Plan Note (Signed)
She feels overall the ranitidine is controlling her symptoms adequately. She had been on Nexium for years and is not interested in going back on a PPI at this time. With her history of C. difficile she is well advised to continue Ranitidine and oral breakthrough meds such as Tums and Rolaids, she needs to avoid offending foods and large meals

## 2011-10-10 NOTE — Patient Instructions (Signed)
Anal Fissure, Adult An anal fissure is a small tear or crack in the skin around the anus. Bleeding from a fissure usually stops on its own within a few minutes. However, bleeding will often reoccur with each bowel movement until the crack heals.  CAUSES   Passing large, hard stools.   Frequent diarrheal stools.   Constipation.   Inflammatory bowel disease (Crohn's disease or ulcerative colitis).   Infections.   Anal sex.  SYMPTOMS   Small amounts of blood seen on your stools, on toilet paper, or in the toilet after a bowel movement.   Rectal bleeding.   Painful bowel movements.   Itching or irritation around the anus.  DIAGNOSIS Your caregiver will examine the anal area. An anal fissure can usually be seen with careful inspection. A rectal exam may be performed and a short tube (anoscope) may be used to examine the anal canal. TREATMENT   You may be instructed to take fiber supplements. These supplements can soften your stool to help make bowel movements easier.   Sitz baths may be recommended to help heal the tear. Do not use soap in the sitz baths.   A medicated cream or ointment may be prescribed to lessen discomfort.  HOME CARE INSTRUCTIONS   Maintain a diet high in fruits, whole grains, and vegetables. Avoid constipating foods like bananas and dairy products.   Take sitz baths as directed by your caregiver.   Drink enough fluids to keep your urine clear or pale yellow.   Only take over-the-counter or prescription medicines for pain, discomfort, or fever as directed by your caregiver. Do not take aspirin as this may increase bleeding.   Do not use ointments containing numbing medications (anesthetics) or hydrocortisone. They could slow healing.  SEEK MEDICAL CARE IF:   Your fissure is not completely healed within 3 days.   You have further bleeding.   You have a fever.   You have diarrhea mixed with blood.   You have pain.   Your problem is getting worse  rather than better.  MAKE SURE YOU:   Understand these instructions.   Will watch your condition.   Will get help right away if you are not doing well or get worse.  Document Released: 08/04/2005 Document Revised: 04/16/2011 Document Reviewed: 01/19/2011 [employer] Patient Information 2012 [employer]. 

## 2011-10-10 NOTE — Assessment & Plan Note (Signed)
This has resolved since being retreated by Infectious disease. Her most recent testing was negative and she is relieved in this regard.

## 2011-10-10 NOTE — Assessment & Plan Note (Signed)
Had to present to the hospital on 10-03-11 with an episode of bright red rectal bleeding. She had eaten a hot on and had significantly painful abdominal cramping followed by diarrhea and ultimately rectal bleeding she says it was a large amount. In the ER she was told she had a anal fissure. The bleeding has not recurred she finds herself anxious. She continues to have abdominal pain and flares with her hiatal hernia and reflux as well. At this point she has found herself without a gastroenterologist. She had hoped to obtain a second opinion regarding management of a hiatal hernia due to her persistent discomfort and pain. Unfortunately the second office chose not to see her and now her initial gastroenterologist does not plan to see her back. With this new episode of rectal bleeding she needs to be seen by gastroenterology. She agrees to a new referral with a different group. Changed and she needs to maintain a bland, low-fat, low acid diet until seen. She is given some Anusol-HC suppositories to use twice a day if rectal bleeding should recur. And if this is unhelpful in rectal bleeding persists she will present to the ER during this time period

## 2011-10-10 NOTE — Assessment & Plan Note (Signed)
Patient tried Citalopram as directed and reports her bp went up and she was unable to sleep sos she stopped it. She feels Alprazolam works better for her than Lorazepam so for now we will allow her Alprazolam up to twice daily and d/c Lorazepam. Will hold off an any further SSRI or SNRI trials until her GI situation has calmed down some.

## 2011-10-10 NOTE — Assessment & Plan Note (Signed)
Adequately controlled on repeat check, no change to medications

## 2011-10-10 NOTE — Progress Notes (Signed)
Patient ID: Megan [name], female   DOB: March 09, 1951, 61 y.o.   MRN: [id number] EVALYSE STROOPE [id number] 12-13-1950 10/10/2011      Progress Note-Follow Up  Subjective  Chief Complaint  Chief Complaint  Patient presents with  . Follow-up    1 month follow up    HPI  Patient is a 61 year old Caucasian female who is in today for ER followup. She presented to the emergency room at [facility] with abdominal pain and rectal bleeding on 10/03/2011. She thought out and immediately had abdominal cramping throughout that next day and diarrhea. By later that morning she was having rectal bleeding she describes as roughly a cup with appropriate blood. She presented to the ER and was told she had an anal fissure. Ultimately they were able to stop her bleeding and it has not recurred. They asked to followup with gastroenterology and at present she does not have a gastroenterologist so she is here today for assistance. She continues to have a tender abdomen but no actual cramping or diarrhea at this time. She continues to have intermittent reflux sometimes severe. For the most part she feels the ranitidine controls the worst of the morning but not always. She has had an unfortunate GI history in the last 1-2 years. At the current C. difficile colitis was finally able to get that under control with the help of infectious disease. She is trying to maintain a bland diet and does feel that helping somewhat. No fevers or chills. No palpitations or shortness of breath reported today. No dysphagia or pharyngitis noted.  Past Medical History  Diagnosis Date  . GERD (gastroesophageal reflux disease)   . Depression   . Anxiety   . Insomnia   . IBS (irritable bowel syndrome)   . Diverticulosis   . Barrett esophagus   . Hiatal hernia   . Unspecified menopausal and postmenopausal disorder   . Vitamin B12 deficiency   . Blind loop syndrome   . Hepatic cyst   . Unspecified  hypothyroidism   . Diarrhea   . Family history of malignant neoplasm of gastrointestinal tract     multiple members  . Endometrial hyperplasia 02/27/2006    BENIGN ENDO BX ON 02/2007  . C. difficile diarrhea   . Osteoporosis 05/2011    -2.5 Lt femoral neck  . Cystocele, midline   . Rectocele   . Uterine prolapse without mention of vaginal wall prolapse   . Chest pain, atypical 07/14/2011  . Leaky heart valve   . Hiatal hernia   . 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 Thrush 07/21/2011  . Rectal bleeding 10/10/2011    Past Surgical History  Procedure Date  . Appendectomy   . Uterine fibroid surgery   . Hysteroscopy     POLYP    Family History  Problem Relation Age of Onset  . Colon cancer Mother   . Diabetes Mother   . Cancer Mother     colon  . Hypertension Mother   . Colon polyps Father   . Colon cancer Maternal Aunt   . Colon cancer Maternal Grandmother   . Breast cancer Maternal Grandmother   . Diabetes Paternal Grandmother   . Breast cancer Paternal Grandmother   . Colon cancer Cousin     X3  . Ovarian cancer Cousin     History   Social History  . Marital Status: Married    Spouse Name: N/A    Number of Children: 4  . Years  of Education: N/A   Occupational History  . Retired    Social History Main Topics  . Smoking status: Former Smoker    Quit date: 08/19/1995  . Smokeless tobacco: Never Used  . Alcohol Use: No  . Drug Use: No  . Sexually Active: No   Other Topics Concern  . Not on file   Social History Narrative  . No narrative on file    Current Outpatient Prescriptions on File Prior to Visit  Medication Sig Dispense Refill  . Calcium Carbonate (CALCIUM 600 PO) Take by mouth.        . Cholecalciferol (VITAMIN D) 2000 UNITS CAPS Take by mouth.        . diltiazem (CARDIZEM CD) 180 MG 24 hr capsule Take 1 capsule (180 mg total) by mouth daily.  90 capsule  3  . lactobacillus acidophilus & bulgar (LACTINEX) chewable tablet Chew 2 tablets by  mouth 3 (three) times daily with meals.  180 tablet  0  . ranitidine (ZANTAC) 300 MG tablet Take 1 tablet (300 mg total) by mouth at bedtime.  30 tablet  0    Allergies  Allergen Reactions  . Sulfamethoxazole Shortness Of Breath    chest tightness  . Iohexol   . Iodinated Diagnostic Agents     Pt states at Dr.Grapeys office 15 years ago blacked out for 2 hours during injection for IVP. Was told never to have IV dye again.     Review of Systems  Review of Systems  Constitutional: Negative for fever and malaise/fatigue.  HENT: Negative for congestion.   Eyes: Negative for discharge.  Respiratory: Negative for shortness of breath.   Cardiovascular: Negative for chest pain, palpitations and leg swelling.  Gastrointestinal: Positive for nausea, abdominal pain, diarrhea and blood in stool. Negative for constipation and melena.  Genitourinary: Negative for dysuria.  Musculoskeletal: Negative for falls.  Skin: Negative for rash.  Neurological: Negative for loss of consciousness and headaches.  Endo/Heme/Allergies: Negative for polydipsia.  Psychiatric/Behavioral: Negative for depression and suicidal ideas. The patient is not nervous/anxious and does not have insomnia.     Objective  BP 134/78  Pulse 84  Temp(Src) 98.8 F (37.1 C) (Temporal)  Ht 5\' 5"  (1.651 m)  Wt 161 lb 12.8 oz (73.392 kg)  BMI 26.92 kg/m2  SpO2 99%  Physical Exam  Physical Exam  Constitutional: She is oriented to person, place, and time and well-developed, well-nourished, and in no distress. No distress.  HENT:  Head: Normocephalic and atraumatic.  Eyes: Conjunctivae are normal.  Neck: Neck supple. No thyromegaly present.  Cardiovascular: Normal rate, regular rhythm and normal heart sounds.   No murmur heard. Pulmonary/Chest: Effort normal and breath sounds normal. She has no wheezes.  Abdominal: Soft. Bowel sounds are normal. She exhibits no distension and no mass. There is tenderness. There is no  rebound and no guarding.       Mildly tender with palp diffusely  Musculoskeletal: She exhibits no edema.  Lymphadenopathy:    She has no cervical adenopathy.  Neurological: She is alert and oriented to person, place, and time.  Skin: Skin is warm and dry. No rash noted. She is not diaphoretic.  Psychiatric: Memory, affect and judgment normal.    Lab Results  Component Value Date   TSH 0.66 12/13/2010   Lab Results  Component Value Date   WBC 8.6 10/03/2011   HGB 14.6 10/03/2011   HCT 42.4 10/03/2011   MCV 89.8 10/03/2011   PLT 274 10/03/2011  Lab Results  Component Value Date   CREATININE 0.58 10/03/2011   BUN 7 10/03/2011   NA 138 10/03/2011   K 4.6 10/03/2011   CL 103 10/03/2011   CO2 24 10/03/2011   Lab Results  Component Value Date   ALT 25 10/03/2011   AST 35 10/03/2011   ALKPHOS 107 10/03/2011   BILITOT 0.3 10/03/2011   Lab Results  Component Value Date   CHOL 218* 06/30/2011   Lab Results  Component Value Date   HDL 55.80 06/30/2011   No results found for this basename: [facility]   Lab Results  Component Value Date   TRIG 80.0 06/30/2011   Lab Results  Component Value Date   CHOLHDL 4 06/30/2011     Assessment & Plan  HYPERTENSION Adequately controlled on repeat check, no change to medications  ANXIETY DEPRESSION Patient tried Citalopram as directed and reports her bp went up and she was unable to sleep sos she stopped it. She feels Alprazolam works better for her than Lorazepam so for now we will allow her Alprazolam up to twice daily and d/c Lorazepam. Will hold off an any further SSRI or SNRI trials until her GI situation has calmed down some.  C. difficile diarrhea This has resolved since being retreated by Infectious disease. Her most recent testing was negative and she is relieved in this regard.  Rectal bleeding Had to present to the hospital on 10-03-11 with an episode of bright red rectal bleeding. She had eaten a hot on and had significantly  painful abdominal cramping followed by diarrhea and ultimately rectal bleeding she says it was a large amount. In the ER she was told she had a anal fissure. The bleeding has not recurred she finds herself anxious. She continues to have abdominal pain and flares with her hiatal hernia and reflux as well. At this point she has found herself without a gastroenterologist. She had hoped to obtain a second opinion regarding management of a hiatal hernia due to her persistent discomfort and pain. Unfortunately the second office chose not to see her and now her initial gastroenterologist does not plan to see her back. With this new episode of rectal bleeding she needs to be seen by gastroenterology. She agrees to a new referral with a different group. Changed and she needs to maintain a bland, low-fat, low acid diet until seen. She is given some Anusol-HC suppositories to use twice a day if rectal bleeding should recur. And if this is unhelpful in rectal bleeding persists she will present to the ER during this time period  Gastroesophageal reflux disease with hiatal hernia She feels overall the ranitidine is controlling her symptoms adequately. She had been on Nexium for years and is not interested in going back on a PPI at this time. With her history of C. difficile she is well advised to continue Ranitidine and oral breakthrough meds such as Tums and Rolaids, she needs to avoid offending foods and large meals

## 2011-10-20 ENCOUNTER — Telehealth: Payer: Self-pay

## 2011-10-20 MED ORDER — CYCLOBENZAPRINE HCL 5 MG PO TABS: 5.0000 mg | ORAL_TABLET | Freq: Two times a day (BID) | ORAL | Status: DC | PRN

## 2011-10-20 NOTE — Telephone Encounter (Signed)
Muscles in chest, back, and shoulders are sore from painting this weekend? Pt states she can't sleep because of everything being so sore? Patient would like to know if anything can be called in to help her muscles? Please advise?

## 2011-10-20 NOTE — Telephone Encounter (Signed)
OK to give Cyclobenzaprine 5 mg tab 1 tab po bid prn pain. Disp: # 40, no refills

## 2011-10-20 NOTE — Telephone Encounter (Signed)
Pt informed of RX being sent to pharmacy

## 2011-10-20 NOTE — Telephone Encounter (Signed)
rx sent to pharmacy

## 2011-10-23 ENCOUNTER — Ambulatory Visit (INDEPENDENT_AMBULATORY_CARE_PROVIDER_SITE_OTHER): Payer: Self-pay | Admitting: Gynecology

## 2011-10-23 ENCOUNTER — Encounter: Payer: Self-pay | Admitting: Gynecology

## 2011-10-23 ENCOUNTER — Other Ambulatory Visit: Payer: Self-pay | Admitting: Gynecology

## 2011-10-23 DIAGNOSIS — R350 Frequency of micturition: Secondary | ICD-10-CM

## 2011-10-23 DIAGNOSIS — N814 Uterovaginal prolapse, unspecified: Secondary | ICD-10-CM

## 2011-10-23 DIAGNOSIS — M810 Age-related osteoporosis without current pathological fracture: Secondary | ICD-10-CM

## 2011-10-23 DIAGNOSIS — N898 Other specified noninflammatory disorders of vagina: Secondary | ICD-10-CM

## 2011-10-23 DIAGNOSIS — N8111 Cystocele, midline: Secondary | ICD-10-CM

## 2011-10-23 DIAGNOSIS — N390 Urinary tract infection, site not specified: Secondary | ICD-10-CM

## 2011-10-23 DIAGNOSIS — N949 Unspecified condition associated with female genital organs and menstrual cycle: Secondary | ICD-10-CM

## 2011-10-23 DIAGNOSIS — N816 Rectocele: Secondary | ICD-10-CM

## 2011-10-23 DIAGNOSIS — R102 Pelvic and perineal pain: Secondary | ICD-10-CM

## 2011-10-23 DIAGNOSIS — M545 Low back pain: Secondary | ICD-10-CM

## 2011-10-23 LAB — URINALYSIS W MICROSCOPIC + REFLEX CULTURE
Ketones, ur: NEGATIVE mg/dL
Leukocytes, UA: NEGATIVE
Nitrite: NEGATIVE
Specific Gravity, Urine: 1.025 (ref 1.005–1.030)
Urobilinogen, UA: 0.2 mg/dL (ref 0.0–1.0)
pH: 6 (ref 5.0–8.0)

## 2011-10-23 LAB — WET PREP FOR TRICH, YEAST, CLUE
Clue Cells Wet Prep HPF POC: NONE SEEN
Yeast Wet Prep HPF POC: NONE SEEN

## 2011-10-23 MED ORDER — CIPROFLOXACIN HCL 250 MG PO TABS: 250.0000 mg | ORAL_TABLET | Freq: Two times a day (BID) | ORAL | Status: DC

## 2011-10-23 MED ORDER — CLINDAMYCIN PHOSPHATE 2 % VA CREA: 1.0000 | TOPICAL_CREAM | Freq: Every day | VAGINAL | Status: AC

## 2011-10-23 MED ORDER — CIPROFLOXACIN HCL 250 MG PO TABS: 250.0000 mg | ORAL_TABLET | Freq: Two times a day (BID) | ORAL | Status: AC

## 2011-10-23 NOTE — Progress Notes (Signed)
Patient presents a very complex history to include her recent emergency room visit due to blood in her stool and a lot of GI issues. She notes now having more of a mucousy watery rectal discharge.  She has a long history of GI issues and has been seen by Dr. Jarold Motto but apparently is having issue getting back into his office, was referred to another physician who then refused to see her. Her primary physician now is trying to get her an appointment at [facility] gastroenterology. Her emergency room evaluation included a negative CT of the abdomen and pelvis. She's having a lot of back pain and now is having some vaginal pain and wondering whether it's due to her history of uterine prolapse, cystocele/rectocele. Also noticed a yellow vaginal discharge and urinary frequency that's been coming and goingfor the last several months. She recently was treated for C. Difficile and it is doing better from that standpoint. Lastly she wanted to ask about her osteoporosis. We were setting her up to be treated with Reclast and her primary physician has suggested waiting on that until her GI issues are improving and she wanted my opinion on that.  Exam was Sherrilyn Rist chaperone present Spine straight no CVA tenderness Abdomen soft nontender without masses guarding rebound organomegaly Pelvic external BUS vagina with moderate atrophic changes mild cystocele mild rectocele. Slight yellow discharge noted.Cervix normal. Uterus retroverted person normal size midline mobile nontender with mild dissent. Adnexa without masses or tenderness. Rectovaginal exam is normal no overt blood noted.  Assessment and plan: 1. Vaginal discomfort.  Wet prep suggestive of BV. We'll treat with Cleocin vaginal cream nightly x1 week. Her cystocele rectocele uterine prolapse are mild I do not think they're contributing to her pain. I think her low back abdominal pain is probably GI related. 2. Urinary frequency. Her UA suggests low-level UTI. Will  cover with ciprofloxacin 250 twice a day x7 days. 3. GI symptoms. Patient will follow up with her primary his arranging an appointment at [facility]. I think the majority of her issues are GI related to follow up with them. 4. Osteoporosis. I agree with her primary physician that at this point I would wait until she gets her GI issues under control before starting the Reclast. I do not think again an oral bisphosphonate would be wise given her current situation. Alternatives such as Prolia could be considered but I think at this point we'll wait and then start her Reclast once she is in to see the GI physicians.

## 2011-10-23 NOTE — Patient Instructions (Signed)
Take oral antibiotics as prescribed. Use vaginal cream as prescribed. Follow up with the gastroenterologist at [facility] is your primary physician is arranging. We can discuss Reclast after your GI evaluation.

## 2011-10-24 ENCOUNTER — Other Ambulatory Visit: Payer: Self-pay

## 2011-10-24 ENCOUNTER — Telehealth: Payer: Self-pay | Admitting: *Deleted

## 2011-10-24 MED ORDER — METRONIDAZOLE 0.75 % VA GEL: 1.0000 | Freq: Two times a day (BID) | VAGINAL | Status: AC

## 2011-10-24 MED ORDER — ESOMEPRAZOLE MAGNESIUM 20 MG PO CPDR: 20.0000 mg | DELAYED_RELEASE_CAPSULE | Freq: Every day | ORAL | Status: DC

## 2011-10-24 NOTE — Telephone Encounter (Signed)
Pt was giving cleocin 2 % vaginal cream on 10/23/11 at [facility] pt said medication is too expensive at $ 90 with insurance. Pt requesting something else if possible? Please advise

## 2011-10-24 NOTE — Telephone Encounter (Signed)
We can try MetroGel as it does not keep her. I'm afraid to use oral Flagyl that would really upset her stomach.

## 2011-10-24 NOTE — Telephone Encounter (Signed)
Pt states she doesn't go to [facility] until April 8th and the GI doctor used to give patient Nexium 40 mg, but pt would like Nexium 20 mg sent to [facility]? Please advise?

## 2011-10-24 NOTE — Telephone Encounter (Signed)
Nexium 20 mg po qd, disp # 30 5 rf

## 2011-10-24 NOTE — Telephone Encounter (Signed)
RX sent and patient informed.  

## 2011-10-24 NOTE — Telephone Encounter (Signed)
Pt asked for rx to be sent to pharmacy with no returned call if approved.

## 2011-10-25 LAB — URINE CULTURE: Colony Count: 2000

## 2011-11-21 ENCOUNTER — Ambulatory Visit: Payer: Self-pay | Admitting: Family Medicine

## 2011-12-02 ENCOUNTER — Encounter: Payer: Self-pay | Admitting: Family Medicine

## 2011-12-02 ENCOUNTER — Ambulatory Visit (INDEPENDENT_AMBULATORY_CARE_PROVIDER_SITE_OTHER): Payer: Self-pay | Admitting: Family Medicine

## 2011-12-02 VITALS — BP 124/72 | HR 80 | Temp 98.2°F | Ht 65.0 in | Wt 156.8 lb

## 2011-12-02 DIAGNOSIS — K625 Hemorrhage of anus and rectum: Secondary | ICD-10-CM

## 2011-12-02 DIAGNOSIS — R002 Palpitations: Secondary | ICD-10-CM

## 2011-12-02 DIAGNOSIS — F411 Generalized anxiety disorder: Secondary | ICD-10-CM

## 2011-12-02 DIAGNOSIS — I10 Essential (primary) hypertension: Secondary | ICD-10-CM

## 2011-12-02 DIAGNOSIS — F329 Major depressive disorder, single episode, unspecified: Secondary | ICD-10-CM

## 2011-12-02 DIAGNOSIS — F341 Dysthymic disorder: Secondary | ICD-10-CM

## 2011-12-02 DIAGNOSIS — F419 Anxiety disorder, unspecified: Secondary | ICD-10-CM

## 2011-12-02 MED ORDER — ALPRAZOLAM 2 MG PO TABS: 2.0000 mg | ORAL_TABLET | Freq: Two times a day (BID) | ORAL | Status: DC | PRN

## 2011-12-02 MED ORDER — SERTRALINE HCL 50 MG PO TABS: ORAL_TABLET | ORAL | Status: DC

## 2011-12-02 NOTE — Patient Instructions (Signed)
Depression  You have signs of depression. This is a common problem. It can occur at any age. It is often hard to recognize. People can suffer from depression and still have moments of enjoyment. Depression interferes with your basic ability to function in life. It upsets your relationships, sleep, eating, and work habits.  CAUSES   Depression is believed to be caused by an imbalance in brain chemicals. It may be triggered by an unpleasant event. Relationship crises, a death in the family, financial worries, retirement, or other stressors are normal causes of depression. Depression may also start for no known reason. Other factors that may play a part include medical illnesses, some medicines, genetics, and alcohol or drug abuse.  SYMPTOMS    Feeling unhappy or worthless.   Long-lasting (chronic) tiredness or worn-out feeling.   Self-destructive thoughts and actions.   Not being able to sleep or sleeping too much.   Eating more than usual or not eating at all.   Headaches or feeling anxious.   Trouble concentrating or making decisions.   Unexplained physical problems and substance abuse.  TREATMENT   Depression usually gets better with treatment. This can include:   Antidepressant medicines. It can take weeks before the proper dose is achieved and benefits are reached.   Talking with a therapist, clergyperson, counselor, or friend. These people can help you gain insight into your problem and regain control of your life.   Eating a good diet.   Getting regular physical exercise, such as walking for 30 minutes every day.   Not abusing alcohol or drugs.  Treating depression often takes 6 months or longer. This length of treatment is needed to keep symptoms from returning. Call your caregiver and arrange for follow-up care as suggested.  SEEK IMMEDIATE MEDICAL CARE IF:    You start to have thoughts of hurting yourself or others.   Call your local emergency services (911 in [place]   Go to your local  medical emergency department.   Call the National Suicide Prevention Lifeline: 1-800-273-TALK (1-800-273-8255).  Document Released: 08/04/2005 Document Revised: 07/24/2011 Document Reviewed: 01/04/2010  [employer] Patient Information 2012 [employer].

## 2011-12-03 ENCOUNTER — Encounter: Payer: Self-pay | Admitting: Family Medicine

## 2011-12-03 NOTE — Assessment & Plan Note (Addendum)
Will try Sertraline now and if no response will progress to Venlafaxine

## 2011-12-03 NOTE — Assessment & Plan Note (Signed)
No recent episodes

## 2011-12-03 NOTE — Progress Notes (Signed)
Patient ID: Megan [name], female   DOB: 08-09-1951, 61 y.o.   MRN: [id number] Megan [name] [id number] [birth year] 12/03/2011      Progress Note-Follow Up  Subjective  Chief Complaint  Chief Complaint  Patient presents with  . Follow-up    6 month    HPI  Patient is a 61 year old Caucasian female who is in today for followup. She is now being seen at [facility] for her GI issues which are numerous. She is pleased with her care and is about to undergo significant repeat testing. No recent rectal bleeding episodes but she does have ongoing discomfort and mild diarrhea. Clearly improved since being treated by infectious disease for her recurrent C. difficile. No fevers, chills, back pain. She continues to struggle with anxiety and depression and is willing to restart her medication at today's visit. No chest pain, palpitation shortness of breath at this time.  Past Medical History  Diagnosis Date  . GERD (gastroesophageal reflux disease)   . Depression   . Anxiety   . Insomnia   . IBS (irritable bowel syndrome)   . Diverticulosis   . Barrett esophagus   . Hiatal hernia   . Unspecified menopausal and postmenopausal disorder   . Vitamin B12 deficiency   . Blind loop syndrome   . Hepatic cyst   . Unspecified hypothyroidism   . Diarrhea   . Family history of malignant neoplasm of gastrointestinal tract     multiple members  . Endometrial hyperplasia 02/27/2006    BENIGN ENDO BX ON 02/2007  . C. difficile diarrhea   . Osteoporosis 05/2011    -2.5 Lt femoral neck  . Cystocele, midline   . Rectocele   . Uterine prolapse without mention of vaginal wall prolapse   . Chest pain, atypical 07/14/2011  . Leaky heart valve   . Hiatal hernia   . 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 Thrush 07/21/2011  . Rectal bleeding 10/10/2011    Past Surgical History  Procedure Date  . Appendectomy   . Uterine fibroid surgery   . Hysteroscopy     POLYP    Family History    Problem Relation Age of Onset  . Colon cancer Mother   . Diabetes Mother   . Cancer Mother     colon  . Hypertension Mother   . Colon polyps Father   . Colon cancer Maternal Aunt   . Colon cancer Maternal Grandmother   . Breast cancer Maternal Grandmother   . Diabetes Paternal Grandmother   . Breast cancer Paternal Grandmother   . Colon cancer Cousin     X3  . Ovarian cancer Cousin     History   Social History  . Marital Status: Married    Spouse Name: N/A    Number of Children: 4  . Years of Education: N/A   Occupational History  . Retired    Social History Main Topics  . Smoking status: Former Smoker    Quit date: 08/19/1995  . Smokeless tobacco: Never Used  . Alcohol Use: No  . Drug Use: No  . Sexually Active: No   Other Topics Concern  . Not on file   Social History Narrative  . No narrative on file    Current Outpatient Prescriptions on File Prior to Visit  Medication Sig Dispense Refill  . Calcium Carbonate (CALCIUM 600 PO) Take by mouth.        . Cholecalciferol (VITAMIN D) 2000 UNITS CAPS Take by mouth.        Marland Kitchen  diltiazem (CARDIZEM CD) 180 MG 24 hr capsule Take 1 capsule (180 mg total) by mouth daily.  90 capsule  3  . lactobacillus acidophilus & bulgar (LACTINEX) chewable tablet Chew 2 tablets by mouth 3 (three) times daily with meals.  180 tablet  0  . ranitidine (ZANTAC) 300 MG tablet Take 1 tablet (300 mg total) by mouth at bedtime.  30 tablet  0  . cyclobenzaprine (FLEXERIL) 5 MG tablet Take 1 tablet (5 mg total) by mouth 2 (two) times daily as needed.  40 tablet  0  . esomeprazole (NEXIUM) 20 MG capsule Take 1 capsule (20 mg total) by mouth daily before breakfast.  30 capsule  5  . sertraline (ZOLOFT) 50 MG tablet 1/2 tab po daily and then increase to 1 tab po daily  30 tablet  3    Allergies  Allergen Reactions  . Sulfamethoxazole Shortness Of Breath    chest tightness  . Iohexol   . Iodinated Diagnostic Agents     Pt states at Dr.Grapeys  office 15 years ago blacked out for 2 hours during injection for IVP. Was told never to have IV dye again.     Review of Systems  Review of Systems  Constitutional: Negative for fever and malaise/fatigue.  HENT: Negative for congestion.   Eyes: Negative for discharge.  Respiratory: Negative for shortness of breath.   Cardiovascular: Negative for chest pain, palpitations and leg swelling.  Gastrointestinal: Positive for heartburn and diarrhea. Negative for nausea, abdominal pain, blood in stool and melena.  Genitourinary: Negative for dysuria.  Musculoskeletal: Negative for falls.  Skin: Negative for rash.  Neurological: Negative for loss of consciousness and headaches.  Endo/Heme/Allergies: Negative for polydipsia.  Psychiatric/Behavioral: Negative for depression and suicidal ideas. The patient is not nervous/anxious and does not have insomnia.     Objective  BP 124/72  Pulse 80  Temp(Src) 98.2 F (36.8 C) (Temporal)  Ht 5\' 5"  (1.651 m)  Wt 156 lb 12.8 oz (71.124 kg)  BMI 26.09 kg/m2  SpO2 98%  Physical Exam  Physical Exam  Constitutional: She is oriented to person, place, and time and well-developed, well-nourished, and in no distress. No distress.  HENT:  Head: Normocephalic and atraumatic.  Eyes: Conjunctivae are normal.  Neck: Neck supple. No thyromegaly present.  Cardiovascular: Normal rate, regular rhythm and normal heart sounds.   Pulmonary/Chest: Effort normal and breath sounds normal. She has no wheezes.  Abdominal: She exhibits no distension and no mass.  Musculoskeletal: She exhibits no edema.  Lymphadenopathy:    She has no cervical adenopathy.  Neurological: She is alert and oriented to person, place, and time.  Skin: Skin is warm and dry. No rash noted. She is not diaphoretic.  Psychiatric: Memory, affect and judgment normal.    Lab Results  Component Value Date   TSH 0.66 12/13/2010   Lab Results  Component Value Date   WBC 8.6 10/03/2011   HGB  14.6 10/03/2011   HCT 42.4 10/03/2011   MCV 89.8 10/03/2011   PLT 274 10/03/2011   Lab Results  Component Value Date   CREATININE 0.58 10/03/2011   BUN 7 10/03/2011   NA 138 10/03/2011   K 4.6 10/03/2011   CL 103 10/03/2011   CO2 24 10/03/2011   Lab Results  Component Value Date   ALT 25 10/03/2011   AST 35 10/03/2011   ALKPHOS 107 10/03/2011   BILITOT 0.3 10/03/2011   Lab Results  Component Value Date   CHOL 218*  06/30/2011   Lab Results  Component Value Date   HDL 55.80 06/30/2011   No results found for this basename: [facility]   Lab Results  Component Value Date   TRIG 80.0 06/30/2011   Lab Results  Component Value Date   CHOLHDL 4 06/30/2011     Assessment & Plan  ANXIETY DEPRESSION Will try Sertraline now and if no response will progress to Venlafaxine  HYPERTENSION Adequately controlled on current meds no changes  Palpitations No recent episodes  Rectal bleeding No recent episodes

## 2011-12-03 NOTE — Assessment & Plan Note (Signed)
Adequately controlled on current meds no changes 

## 2012-01-15 ENCOUNTER — Telehealth: Payer: Self-pay

## 2012-01-15 NOTE — Telephone Encounter (Signed)
It sounds like she needs to go ahead and stop the bp med, but the key thing with her bps is HOW IS SHE FEELING? Lightheaded, generalized weakness, etc?

## 2012-01-15 NOTE — Telephone Encounter (Signed)
Patient left a message stating that Dr Excell Seltzer put her on BP meds and BP is running low. Pt states she went to the eye doctor today and it was 99/44. Eye doctor told patient she should contact her MD. Pt stated her pulse is always in the 60's, but BP runs 105/57, 116/66 and one day she was stressed and the top number was 134. Patient stated that since she started the Zoloft she notices that the BP started running lower. Please advise?

## 2012-01-15 NOTE — Telephone Encounter (Signed)
Patient left a message stating that Dr Cooper put her on BP meds and BP is running low. Pt states she went to the eye doctor today and it was 99/44. Eye doctor told patient she should contact her MD. Pt stated her pulse is always in the 60's, but BP runs 105/57, 116/66 and one day she was stressed and the top number was 134. Patient stated that since she started the Zoloft she notices that the BP started running lower. Please advise? 

## 2012-01-16 NOTE — Telephone Encounter (Signed)
Pt states that when she starts "feeling bad" she gets blurred vision, dizzy, and very tired.  She will wake up during the night and her pulse will be in the 60's.  Pt's norm is usually 70's-90's.  Advised she will most likely need to stop BP med.  She has not taken today's dose.  Any further recommendations?

## 2012-01-16 NOTE — Telephone Encounter (Signed)
Pt advised.  She will continue diltiazem-the only BP med she is taking.  She will monitor BP daily and when symptomatic.  She will stop med as directed if needed and call next week to schedule an appt with Dr. Abner Greenspan.

## 2012-01-16 NOTE — Telephone Encounter (Signed)
I see that she was already on diltiazem, which is a BP med, when Dr. Abner Greenspan saw her last month.  Which bp med is she referring to when she says that Dr. Excell Seltzer put her on a bp med?  I'm assuming it is one IN ADDITION TO her diltiazem.  Is that right? Also, HR in the 60s is fine, esp in the night when she has been sleeping---this is normal and she needs to be reassured. Otherwise I would recommend checking bp once daily + any other time when she feels her symptoms that you described.  If they are <110 on top and/or <60 on bottom, then she should stop ALL her bp meds (the diltiazem is the only one I see on her med list).  Drink plenty of sugar-free fluids.  Make f/u appt with Dr. Abner Greenspan to review bp's next week.-thx

## 2012-01-20 ENCOUNTER — Telehealth: Payer: Self-pay | Admitting: Cardiovascular Disease

## 2012-01-20 NOTE — Telephone Encounter (Signed)
Please review phone notes with pt's PCP.

## 2012-01-20 NOTE — Telephone Encounter (Signed)
Pt calling re cardizem lowering bp too much, 99/44  Last reading, can lower dosage?

## 2012-01-21 NOTE — Telephone Encounter (Signed)
I spoke with the pt and she complains of having a low BP while taking Cardizem CD 180mg .  The pt has noticed that her BP has dropped since starting Zoloft. I reviewed the pt's notes from her PCP and they had instructed her to stop Cardizem CD.  The pt did not stop this medication.  The pt would like to take a lower dose of Cardizem CD if okay with Dr Excell Seltzer.  I will forward this message to MD for review and further orders.

## 2012-01-22 NOTE — Telephone Encounter (Signed)
Okay to take Cardizem CD 120 mg daily

## 2012-01-23 MED ORDER — DILTIAZEM HCL ER COATED BEADS 120 MG PO CP24: 120.0000 mg | ORAL_CAPSULE | Freq: Every day | ORAL | Status: DC

## 2012-01-23 NOTE — Telephone Encounter (Signed)
I spoke with the pt and made her aware of new dosage on Cardizem CD.  Rx sent to pharmacy. I made the pt aware that if her BP remains low and she has fatigue on new dosage then she should stop this medication. Pt agreed with plan.

## 2012-02-03 ENCOUNTER — Encounter: Payer: Self-pay | Admitting: Family Medicine

## 2012-02-03 ENCOUNTER — Ambulatory Visit (HOSPITAL_BASED_OUTPATIENT_CLINIC_OR_DEPARTMENT_OTHER)
Admission: RE | Admit: 2012-02-03 | Discharge: 2012-02-03 | Disposition: A | Discharge status: Home / Self Care | Payer: Self-pay | Source: Ambulatory Visit | Attending: Family Medicine | Admitting: Family Medicine

## 2012-02-03 ENCOUNTER — Ambulatory Visit (INDEPENDENT_AMBULATORY_CARE_PROVIDER_SITE_OTHER): Payer: Self-pay | Admitting: Family Medicine

## 2012-02-03 VITALS — BP 148/84 | HR 82 | Temp 97.2°F | Ht 65.0 in | Wt 156.0 lb

## 2012-02-03 DIAGNOSIS — R221 Localized swelling, mass and lump, neck: Secondary | ICD-10-CM | POA: Insufficient documentation

## 2012-02-03 DIAGNOSIS — F329 Major depressive disorder, single episode, unspecified: Secondary | ICD-10-CM

## 2012-02-03 DIAGNOSIS — R220 Localized swelling, mass and lump, head: Secondary | ICD-10-CM

## 2012-02-03 DIAGNOSIS — I10 Essential (primary) hypertension: Secondary | ICD-10-CM

## 2012-02-03 DIAGNOSIS — F341 Dysthymic disorder: Secondary | ICD-10-CM

## 2012-02-03 DIAGNOSIS — R51 Headache: Secondary | ICD-10-CM | POA: Insufficient documentation

## 2012-02-03 DIAGNOSIS — J329 Chronic sinusitis, unspecified: Secondary | ICD-10-CM | POA: Insufficient documentation

## 2012-02-03 DIAGNOSIS — R1013 Epigastric pain: Secondary | ICD-10-CM

## 2012-02-03 DIAGNOSIS — F3289 Other specified depressive episodes: Secondary | ICD-10-CM

## 2012-02-03 DIAGNOSIS — J019 Acute sinusitis, unspecified: Secondary | ICD-10-CM

## 2012-02-03 HISTORY — DX: Acute sinusitis, unspecified: J01.90

## 2012-02-03 MED ORDER — SERTRALINE HCL 50 MG PO TABS: 75.0000 mg | ORAL_TABLET | Freq: Every day | ORAL | Status: DC

## 2012-02-03 MED ORDER — RANITIDINE HCL 300 MG PO TABS: 300.0000 mg | ORAL_TABLET | Freq: Every day | ORAL | Status: DC

## 2012-02-03 MED ORDER — AZITHROMYCIN 250 MG PO TABS: ORAL_TABLET | ORAL | Status: AC

## 2012-02-03 MED ORDER — METHYLPREDNISOLONE 4 MG PO KIT: PACK | ORAL | Status: DC

## 2012-02-03 NOTE — Assessment & Plan Note (Signed)
Will proceed with CT of sinuses due to length and severity of symptoms and facial swelling on left noted. Start a Zpak, Medrol dose pak

## 2012-02-03 NOTE — Assessment & Plan Note (Signed)
Mild elevation today, did just have her Cardizem decreased but also is struggling with pain and increased depression, will treat both and recheck bp in 2 weeks before changing bp meds

## 2012-02-03 NOTE — Progress Notes (Signed)
Patient ID: Megan [name], female   DOB: 1950-11-16, 61 y.o.   MRN: [id number] Megan [name] [id number] [birth year] 02/03/2012      Progress Note-Follow Up  Subjective  Chief Complaint  Chief Complaint  Patient presents with  . Follow-up    2 month     HPI  Patient is a 61 year old Caucasian female who is in today for followup. She is noting that initially when he started sertraline mood improved and she had a good response but notes over the last couple weeks it seems her worn off. She is a low mood with difficulty concentrating irritability worsening again. No suicidal ideation. She denies any new stressors except for her ongoing health and life stressors which are unchanged. She is having an increase in headache and sinus pressure. She did see her eye doctor recently for some pain and discomfort behind her left eye. Has long history of dry eyes and has had some recent visual changes. She feels both her eyes are weaker but only her left eye hurts. Is also complaining of left ear pain and pressure and pain over her for head as well as over the left cheek. She's had fevers and intermittent sweats. She is nasal congestion dry nose. Postnasal drip and trouble swallowing occurs at times. No obvious chills. No chest pain, palpitation new GI or GU complaints. She does note some mild decrease in her hearing as well  Past Medical History  Diagnosis Date  . GERD (gastroesophageal reflux disease)   . Depression   . Anxiety   . Insomnia   . IBS (irritable bowel syndrome)   . Diverticulosis   . Barrett esophagus   . Hiatal hernia   . Unspecified menopausal and postmenopausal disorder   . Vitamin B12 deficiency   . Blind loop syndrome   . Hepatic cyst   . Unspecified hypothyroidism   . Diarrhea   . Family history of malignant neoplasm of gastrointestinal tract     multiple members  . Endometrial hyperplasia 02/27/2006    BENIGN ENDO BX ON 02/2007  . C. difficile diarrhea   .  Osteoporosis 05/2011    -2.5 Lt femoral neck  . Cystocele, midline   . Rectocele   . Uterine prolapse without mention of vaginal wall prolapse   . Chest pain, atypical 07/14/2011  . Leaky heart valve   . Hiatal hernia   . 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 Thrush 07/21/2011  . Rectal bleeding 10/10/2011  . Sinusitis acute 02/03/2012    Past Surgical History  Procedure Date  . Appendectomy   . Uterine fibroid surgery   . Hysteroscopy     POLYP    Family History  Problem Relation Age of Onset  . Colon cancer Mother   . Diabetes Mother   . Cancer Mother     colon  . Hypertension Mother   . Colon polyps Father   . Colon cancer Maternal Aunt   . Colon cancer Maternal Grandmother   . Breast cancer Maternal Grandmother   . Diabetes Paternal Grandmother   . Breast cancer Paternal Grandmother   . Colon cancer Cousin     X3  . Ovarian cancer Cousin     History   Social History  . Marital Status: Married    Spouse Name: N/A    Number of Children: 4  . Years of Education: N/A   Occupational History  . Retired    Social History Main Topics  . Smoking status: Former Smoker  Quit date: 08/19/1995  . Smokeless tobacco: Never Used  . Alcohol Use: No  . Drug Use: No  . Sexually Active: No   Other Topics Concern  . Not on file   Social History Narrative  . No narrative on file    Current Outpatient Prescriptions on File Prior to Visit  Medication Sig Dispense Refill  . alprazolam (XANAX) 2 MG tablet Take 1 tablet (2 mg total) by mouth 2 (two) times daily as needed for sleep or anxiety. 1 tab po daily prn anxiety  60 tablet  2  . Calcium Carbonate (CALCIUM 600 PO) Take by mouth.        . Cholecalciferol (VITAMIN D) 2000 UNITS CAPS Take by mouth.        . diltiazem (CARDIZEM CD) 120 MG 24 hr capsule Take 1 capsule (120 mg total) by mouth daily.  90 capsule  3  . lactobacillus acidophilus & bulgar (LACTINEX) chewable tablet Chew 2 tablets by mouth 3 (three) times  daily with meals.  180 tablet  0  . DISCONTD: ranitidine (ZANTAC) 300 MG tablet Take 1 tablet (300 mg total) by mouth at bedtime.  30 tablet  0  . DISCONTD: sertraline (ZOLOFT) 50 MG tablet 1/2 tab po daily and then increase to 1 tab po daily  30 tablet  3    Allergies  Allergen Reactions  . Sulfamethoxazole Shortness Of Breath    chest tightness  . Iohexol   . Iodinated Diagnostic Agents     Pt states at Dr.Grapeys office 15 years ago blacked out for 2 hours during injection for IVP. Was told never to have IV dye again.     Review of Systems  Review of Systems  Constitutional: Positive for fever. Negative for chills and malaise/fatigue.  HENT: Positive for ear pain. Negative for congestion, sore throat and tinnitus.   Eyes: Negative for discharge.  Respiratory: Negative for cough, sputum production, shortness of breath and wheezing.   Cardiovascular: Negative for chest pain, palpitations and leg swelling.  Gastrointestinal: Positive for abdominal pain. Negative for nausea and diarrhea.  Genitourinary: Negative for dysuria.  Musculoskeletal: Negative for falls.  Skin: Negative for rash.  Neurological: Positive for headaches. Negative for loss of consciousness.  Endo/Heme/Allergies: Negative for polydipsia.  Psychiatric/Behavioral: Positive for depression. Negative for suicidal ideas. The patient is nervous/anxious. The patient does not have insomnia.     Objective  BP 148/84  Pulse 82  Temp 97.2 F (36.2 C) (Temporal)  Ht 5\' 5"  (1.651 m)  Wt 156 lb (70.761 kg)  BMI 25.96 kg/m2  SpO2 98%  Physical Exam  Physical Exam  Constitutional: She is oriented to person, place, and time and well-developed, well-nourished, and in no distress. No distress.  HENT:  Head: Normocephalic and atraumatic.  Right Ear: External ear normal.  Mouth/Throat: Oropharynx is clear and moist.       Left TM dull, erythematous, retracted. Nares boggy and erythematous. Left nasolabial fold  edematous.  Eyes: Conjunctivae and EOM are normal. Pupils are equal, round, and reactive to light. Right eye exhibits no discharge. Left eye exhibits no discharge.  Neck: Neck supple. No thyromegaly present.  Cardiovascular: Normal rate, regular rhythm and normal heart sounds.   No murmur heard. Pulmonary/Chest: Effort normal and breath sounds normal. She has no wheezes.  Abdominal: She exhibits no distension and no mass.  Musculoskeletal: She exhibits no edema.  Lymphadenopathy:    She has no cervical adenopathy.  Neurological: She is alert and oriented to  person, place, and time.  Skin: Skin is warm and dry. No rash noted. She is not diaphoretic.  Psychiatric: Memory, affect and judgment normal.    Lab Results  Component Value Date   TSH 0.66 12/13/2010   Lab Results  Component Value Date   WBC 8.6 10/03/2011   HGB 14.6 10/03/2011   HCT 42.4 10/03/2011   MCV 89.8 10/03/2011   PLT 274 10/03/2011   Lab Results  Component Value Date   CREATININE 0.58 10/03/2011   BUN 7 10/03/2011   NA 138 10/03/2011   K 4.6 10/03/2011   CL 103 10/03/2011   CO2 24 10/03/2011   Lab Results  Component Value Date   ALT 25 10/03/2011   AST 35 10/03/2011   ALKPHOS 107 10/03/2011   BILITOT 0.3 10/03/2011   Lab Results  Component Value Date   CHOL 218* 06/30/2011   Lab Results  Component Value Date   HDL 55.80 06/30/2011   No results found for this basename: LDLCALC   Lab Results  Component Value Date   TRIG 80.0 06/30/2011   Lab Results  Component Value Date   CHOLHDL 4 06/30/2011     Assessment & Plan  ANXIETY DEPRESSION Initial response to Zoloft was good but she is feeling more down again, will titrated medication up at this time to 75 mg daily and reassess  HYPERTENSION Mild elevation today, did just have her Cardizem decreased but also is struggling with pain and increased depression, will treat both and recheck bp in 2 weeks before changing bp meds  Sinusitis acute Will proceed  with CT of sinuses due to length and severity of symptoms and facial swelling on left noted. Start a Zpak, Medrol dose pak

## 2012-02-03 NOTE — Patient Instructions (Addendum)

## 2012-02-03 NOTE — Assessment & Plan Note (Signed)
Initial response to Zoloft was good but she is feeling more down again, will titrated medication up at this time to 75 mg daily and reassess

## 2012-02-04 ENCOUNTER — Telehealth: Payer: Self-pay

## 2012-02-04 ENCOUNTER — Other Ambulatory Visit: Payer: Self-pay | Admitting: Family Medicine

## 2012-02-04 DIAGNOSIS — J341 Cyst and mucocele of nose and nasal sinus: Secondary | ICD-10-CM

## 2012-02-04 DIAGNOSIS — J342 Deviated nasal septum: Secondary | ICD-10-CM

## 2012-02-04 NOTE — Telephone Encounter (Signed)
Pt would like to proceed with ENT referral. Needs to be someone in [facility] system

## 2012-02-04 NOTE — Telephone Encounter (Signed)
OK to give her some Norco 5/325 tabs 1 tab o q 8 hr, as needed , dis: 40, 1 rf

## 2012-02-04 NOTE — Telephone Encounter (Signed)
Patient called back and left a message saying that she doesn't want to proceed with the referral now.  Pt would like to know if MD would send something in for her pain? Pt stated she takes Tylenol but it doesn't help. Please advise?

## 2012-02-05 MED ORDER — HYDROCODONE-ACETAMINOPHEN 5-325 MG PO TABS: 1.0000 | ORAL_TABLET | Freq: Three times a day (TID) | ORAL | Status: DC | PRN

## 2012-02-05 NOTE — Telephone Encounter (Signed)
Pt advised that RX was faxed to pharmacy

## 2012-03-11 ENCOUNTER — Other Ambulatory Visit: Payer: Self-pay

## 2012-03-11 DIAGNOSIS — F329 Major depressive disorder, single episode, unspecified: Secondary | ICD-10-CM

## 2012-03-11 MED ORDER — SERTRALINE HCL 50 MG PO TABS: 75.0000 mg | ORAL_TABLET | Freq: Every day | ORAL | Status: DC

## 2012-03-11 NOTE — Telephone Encounter (Signed)
She can increase her Sertraline to 100mg  tabs po daily, disp # 30 1 but she needs to come in in 4-6 weeks so we can evaluate how she is doing on it

## 2012-03-11 NOTE — Telephone Encounter (Signed)
Pt called in stating that she needed a refill on her Zoloft (RX sent). Pt also stated that she has taken 100 mg a couple times and she thought it worked and would like to know if she could have an RX of 100mg  because she has been under a lot of stress? Please advise?

## 2012-03-12 MED ORDER — SERTRALINE HCL 100 MG PO TABS: 100.0000 mg | ORAL_TABLET | Freq: Every day | ORAL | Status: DC

## 2012-03-12 NOTE — Telephone Encounter (Signed)
RX sent to pharmacy and pt informed 

## 2012-03-23 ENCOUNTER — Ambulatory Visit (INDEPENDENT_AMBULATORY_CARE_PROVIDER_SITE_OTHER): Payer: Self-pay | Admitting: Family Medicine

## 2012-03-23 ENCOUNTER — Encounter: Payer: Self-pay | Admitting: Family Medicine

## 2012-03-23 VITALS — BP 160/74 | HR 74 | Temp 97.1°F | Ht 65.0 in | Wt 156.0 lb

## 2012-03-23 DIAGNOSIS — F419 Anxiety disorder, unspecified: Secondary | ICD-10-CM

## 2012-03-23 DIAGNOSIS — I10 Essential (primary) hypertension: Secondary | ICD-10-CM

## 2012-03-23 DIAGNOSIS — G43909 Migraine, unspecified, not intractable, without status migrainosus: Secondary | ICD-10-CM

## 2012-03-23 DIAGNOSIS — J342 Deviated nasal septum: Secondary | ICD-10-CM

## 2012-03-23 DIAGNOSIS — L259 Unspecified contact dermatitis, unspecified cause: Secondary | ICD-10-CM

## 2012-03-23 DIAGNOSIS — J341 Cyst and mucocele of nose and nasal sinus: Secondary | ICD-10-CM

## 2012-03-23 DIAGNOSIS — J3489 Other specified disorders of nose and nasal sinuses: Secondary | ICD-10-CM

## 2012-03-23 DIAGNOSIS — J019 Acute sinusitis, unspecified: Secondary | ICD-10-CM

## 2012-03-23 DIAGNOSIS — F411 Generalized anxiety disorder: Secondary | ICD-10-CM

## 2012-03-23 DIAGNOSIS — J329 Chronic sinusitis, unspecified: Secondary | ICD-10-CM

## 2012-03-23 DIAGNOSIS — F341 Dysthymic disorder: Secondary | ICD-10-CM

## 2012-03-23 DIAGNOSIS — R110 Nausea: Secondary | ICD-10-CM

## 2012-03-23 HISTORY — DX: Unspecified contact dermatitis, unspecified cause: L25.9

## 2012-03-23 MED ORDER — METHYLPREDNISOLONE 4 MG PO KIT: PACK | ORAL | Status: AC

## 2012-03-23 MED ORDER — PROMETHAZINE HCL 25 MG PO TABS: 25.0000 mg | ORAL_TABLET | Freq: Three times a day (TID) | ORAL | Status: DC | PRN

## 2012-03-23 MED ORDER — HYDROCODONE-ACETAMINOPHEN 5-325 MG PO TABS: 1.0000 | ORAL_TABLET | Freq: Three times a day (TID) | ORAL | Status: DC | PRN

## 2012-03-23 MED ORDER — CEFDINIR 300 MG PO CAPS: 28.0000 mg | ORAL_CAPSULE | Freq: Two times a day (BID) | ORAL | Status: AC

## 2012-03-23 MED ORDER — TRIAMCINOLONE ACETONIDE 0.1 % EX CREA: TOPICAL_CREAM | Freq: Two times a day (BID) | CUTANEOUS | Status: DC | PRN

## 2012-03-23 MED ORDER — DIAZEPAM 10 MG PO TABS: 10.0000 mg | ORAL_TABLET | Freq: Two times a day (BID) | ORAL | Status: AC | PRN

## 2012-03-23 MED ORDER — GUAIFENESIN ER 600 MG PO TB12: 600.0000 mg | ORAL_TABLET | Freq: Two times a day (BID) | ORAL | Status: DC

## 2012-03-23 MED ORDER — DILTIAZEM HCL ER COATED BEADS 180 MG PO CP24: 180.0000 mg | ORAL_CAPSULE | Freq: Every day | ORAL | Status: DC

## 2012-03-23 NOTE — Progress Notes (Signed)
Patient ID: Megan [name], female   DOB: [birth year], 61 y.o.   MRN: [id number] AZHANE ECKART [id number] 1951/02/03 03/23/2012      Progress Note-Follow Up  Subjective  Chief Complaint  Chief Complaint  Patient presents with  . deviated septum    causing pain X 2 weeks  . discuss medications    add something with Zoloft- xanax doesn't last long  . BP elevated    BP has been running 147 to 159  over 82    HPI  Is a 61 year old Caucasian female is in today with worsening headache and congestion. We since maxillofacial CT scan showed a left-sided nasal cyst in her sinus and right-sided septal deviation which is stable. No active disease was noted at that time this was June. Now she's having increasing malaise and headache, congestion and she believes she is becoming infected again. She had a Medrol Dosepak after her last visit and that was temporarily helpful. She recalls a root canal she had years ago in her left upper jaw line where they took 6 hours and put great deal of silicone up into her sinuses. After that she had a lot of sinus trouble and she is concerned about some possible complications now. No chest pain, palpitations, shortness of breath. Does have some intermittent left ear pain as well.   Past Medical History  Diagnosis Date  . GERD (gastroesophageal reflux disease)   . Depression   . Anxiety   . Insomnia   . IBS (irritable bowel syndrome)   . Diverticulosis   . Barrett esophagus   . Hiatal hernia   . Unspecified menopausal and postmenopausal disorder   . Vitamin B12 deficiency   . Blind loop syndrome   . Hepatic cyst   . Unspecified hypothyroidism   . Diarrhea   . Family history of malignant neoplasm of gastrointestinal tract     multiple members  . Endometrial hyperplasia 02/27/2006    BENIGN ENDO BX ON 02/2007  . C. difficile diarrhea   . Osteoporosis 05/2011    -2.5 Lt femoral neck  . Cystocele, midline   . Rectocele   . Uterine prolapse without  mention of vaginal wall prolapse   . Chest pain, atypical 07/14/2011  . Leaky heart valve   . Hiatal hernia   . 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 Thrush 07/21/2011  . Rectal bleeding 10/10/2011  . Sinusitis acute 02/03/2012  . Contact dermatitis 03/23/2012    Past Surgical History  Procedure Date  . Appendectomy   . Uterine fibroid surgery   . Hysteroscopy     POLYP    Family History  Problem Relation Age of Onset  . Colon cancer Mother   . Diabetes Mother   . Cancer Mother     colon  . Hypertension Mother   . Colon polyps Father   . Colon cancer Maternal Aunt   . Colon cancer Maternal Grandmother   . Breast cancer Maternal Grandmother   . Diabetes Paternal Grandmother   . Breast cancer Paternal Grandmother   . Colon cancer Cousin     X3  . Ovarian cancer Cousin     History   Social History  . Marital Status: Married    Spouse Name: N/A    Number of Children: 4  . Years of Education: N/A   Occupational History  . Retired    Social History Main Topics  . Smoking status: Former Smoker    Quit date: 08/19/1995  . Smokeless tobacco:  Never Used  . Alcohol Use: No  . Drug Use: No  . Sexually Active: No   Other Topics Concern  . Not on file   Social History Narrative  . No narrative on file    Current Outpatient Prescriptions on File Prior to Visit  Medication Sig Dispense Refill  . Calcium Carbonate (CALCIUM 600 PO) Take by mouth.        . Cholecalciferol (VITAMIN D) 2000 UNITS CAPS Take by mouth.        . lactobacillus acidophilus & bulgar (LACTINEX) chewable tablet Chew 2 tablets by mouth 3 (three) times daily with meals.  180 tablet  0  . ranitidine (ZANTAC) 300 MG tablet Take 1 tablet (300 mg total) by mouth at bedtime.  90 tablet  3  . sertraline (ZOLOFT) 100 MG tablet Take 1 tablet (100 mg total) by mouth daily.  30 tablet  1  . promethazine (PHENERGAN) 25 MG tablet Take 1 tablet (25 mg total) by mouth every 8 (eight) hours as needed for nausea.  20  tablet  1    Allergies  Allergen Reactions  . Sulfamethoxazole Shortness Of Breath    chest tightness  . Iohexol   . Iodinated Diagnostic Agents     Pt states at Dr.Grapeys office 15 years ago blacked out for 2 hours during injection for IVP. Was told never to have IV dye again.     Review of Systems  Review of Systems  Constitutional: Positive for malaise/fatigue. Negative for fever and chills.  HENT: Positive for ear pain, congestion and sore throat.   Eyes: Negative for discharge.  Respiratory: Positive for cough and sputum production. Negative for shortness of breath and wheezing.   Cardiovascular: Negative for chest pain, palpitations and leg swelling.  Gastrointestinal: Negative for nausea, abdominal pain and diarrhea.  Genitourinary: Negative for dysuria.  Musculoskeletal: Negative for falls.  Skin: Negative for rash.  Neurological: Positive for headaches. Negative for loss of consciousness.  Endo/Heme/Allergies: Negative for polydipsia.  Psychiatric/Behavioral: Negative for depression and suicidal ideas. The patient is nervous/anxious and has insomnia.     Objective  BP 160/74  Pulse 74  Temp 97.1 F (36.2 C) (Temporal)  Ht 5\' 5"  (1.651 m)  Wt 156 lb (70.761 kg)  BMI 25.96 kg/m2  SpO2 97%  Physical Exam  Physical Exam  Constitutional: She is oriented to person, place, and time and well-developed, well-nourished, and in no distress. No distress.  HENT:  Head: Normocephalic and atraumatic.       Left TM is erythematous and dull, right TM obscured by cerumen. Nasal mucosa boggy and erythematous  Eyes: Conjunctivae are normal.  Neck: Neck supple. No thyromegaly present.  Cardiovascular: Normal rate, regular rhythm and normal heart sounds.   No murmur heard. Pulmonary/Chest: Effort normal and breath sounds normal. She has no wheezes.  Abdominal: She exhibits no distension and no mass.  Musculoskeletal: She exhibits no edema.  Lymphadenopathy:    She has no  cervical adenopathy.  Neurological: She is alert and oriented to person, place, and time.  Skin: Skin is warm and dry. No rash noted. She is not diaphoretic.  Psychiatric: Memory, affect and judgment normal.    Lab Results  Component Value Date   TSH 0.66 12/13/2010   Lab Results  Component Value Date   WBC 8.6 10/03/2011   HGB 14.6 10/03/2011   HCT 42.4 10/03/2011   MCV 89.8 10/03/2011   PLT 274 10/03/2011   Lab Results  Component Value Date  CREATININE 0.58 10/03/2011   BUN 7 10/03/2011   NA 138 10/03/2011   K 4.6 10/03/2011   CL 103 10/03/2011   CO2 24 10/03/2011   Lab Results  Component Value Date   ALT 25 10/03/2011   AST 35 10/03/2011   ALKPHOS 107 10/03/2011   BILITOT 0.3 10/03/2011   Lab Results  Component Value Date   CHOL 218* 06/30/2011   Lab Results  Component Value Date   HDL 55.80 06/30/2011   Lab Results  Component Value Date   LDLCALC 112 11/21/2010   Lab Results  Component Value Date   TRIG 80.0 06/30/2011   Lab Results  Component Value Date   CHOLHDL 4 06/30/2011     Assessment & Plan  Sinusitis acute Now recurrent with stable rightward deviated septum and a left sided sinus cyst. Started on Cefdinir and Medrol dosepak today and referred to ENT for further consideration, encouraged Mucinex bid as well  Contact dermatitis Left great toe, cleanse with Witch Hazel Astringent and then apply triamcinolone cream bid  HYPERTENSION Worse with increased pain, has recently had her Cardizem CD increased to 180 mg daily  MIGRAINE HEADACHE Recently worse with sinusitis, given a refill on her pain meds to use as needed  ANXIETY DEPRESSION Alprazolam is no longer working, will stop this and start Diazepam 10 mg po bid prn

## 2012-03-23 NOTE — Assessment & Plan Note (Signed)
Now recurrent with stable rightward deviated septum and a left sided sinus cyst. Started on Cefdinir and Medrol dosepak today and referred to ENT for further consideration, encouraged Mucinex bid as well

## 2012-03-23 NOTE — Patient Instructions (Addendum)

## 2012-03-23 NOTE — Assessment & Plan Note (Signed)
Left great toe, cleanse with [facility] Astringent and then apply triamcinolone cream bid

## 2012-03-23 NOTE — Assessment & Plan Note (Signed)
Worse with increased pain, has recently had her Cardizem CD increased to 180 mg daily

## 2012-03-23 NOTE — Assessment & Plan Note (Signed)
Recently worse with sinusitis, given a refill on her pain meds to use as needed

## 2012-03-23 NOTE — Assessment & Plan Note (Signed)
Alprazolam is no longer working, will stop this and start Diazepam 10 mg po bid prn

## 2012-04-02 ENCOUNTER — Telehealth: Payer: Self-pay

## 2012-04-02 MED ORDER — AZITHROMYCIN 250 MG PO TABS: 250.0000 mg | ORAL_TABLET | Freq: Every day | ORAL | Status: DC

## 2012-04-02 NOTE — Telephone Encounter (Signed)
Ok to call in azithromycin 250mg  tabs, 2 tabs po qd x 1d, then 1 tab po qd x 4d, #6, no RF.  F/u with Dr. Abner Greenspan in office if no better after this course of abx.--thx

## 2012-04-02 NOTE — Telephone Encounter (Signed)
Pt states she finished the Prednisone but stopped the antibiotics because she got so "sick" (nausea and diarrhea). Pt states she is not any better with the sinus infection and her whole body hurts. Pt would like to know if she can have a zpack sent in to her pharmacy? Please advise?

## 2012-04-09 ENCOUNTER — Telehealth: Payer: Self-pay | Admitting: Family Medicine

## 2012-04-09 NOTE — Telephone Encounter (Signed)
Pt would like to see MD for this. Appt scheduled

## 2012-04-09 NOTE — Telephone Encounter (Signed)
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

## 2012-04-09 NOTE — Telephone Encounter (Signed)
Please advise 

## 2012-04-09 NOTE — Telephone Encounter (Signed)
Left a message for patient to return my call. 

## 2012-04-12 ENCOUNTER — Encounter: Payer: Self-pay | Admitting: Cardiovascular Disease

## 2012-04-12 ENCOUNTER — Ambulatory Visit (INDEPENDENT_AMBULATORY_CARE_PROVIDER_SITE_OTHER): Payer: Self-pay | Admitting: Family Medicine

## 2012-04-12 ENCOUNTER — Ambulatory Visit (INDEPENDENT_AMBULATORY_CARE_PROVIDER_SITE_OTHER): Payer: Self-pay | Admitting: Cardiovascular Disease

## 2012-04-12 ENCOUNTER — Encounter: Payer: Self-pay | Admitting: Family Medicine

## 2012-04-12 VITALS — BP 142/84 | HR 90 | Temp 97.9°F | Ht 65.0 in | Wt 159.4 lb

## 2012-04-12 VITALS — BP 152/81 | HR 71 | Ht 65.0 in | Wt 158.0 lb

## 2012-04-12 DIAGNOSIS — J019 Acute sinusitis, unspecified: Secondary | ICD-10-CM

## 2012-04-12 DIAGNOSIS — I10 Essential (primary) hypertension: Secondary | ICD-10-CM

## 2012-04-12 DIAGNOSIS — R0789 Other chest pain: Secondary | ICD-10-CM

## 2012-04-12 DIAGNOSIS — I079 Rheumatic tricuspid valve disease, unspecified: Secondary | ICD-10-CM

## 2012-04-12 DIAGNOSIS — I071 Rheumatic tricuspid insufficiency: Secondary | ICD-10-CM

## 2012-04-12 DIAGNOSIS — R51 Headache: Secondary | ICD-10-CM

## 2012-04-12 MED ORDER — HYDROCHLOROTHIAZIDE 25 MG PO TABS: 25.0000 mg | ORAL_TABLET | Freq: Every day | ORAL | Status: DC

## 2012-04-12 MED ORDER — NEBIVOLOL HCL 5 MG PO TABS: 5.0000 mg | ORAL_TABLET | Freq: Every day | ORAL | Status: DC

## 2012-04-12 MED ORDER — BUTALBITAL-APAP-CAFFEINE 50-325-40 MG PO TABS: 1.0000 | ORAL_TABLET | ORAL | Status: AC | PRN

## 2012-04-12 NOTE — Assessment & Plan Note (Signed)
Blood pressure is suboptimally controlled. I have recommended the addition of hydrochlorothiazide 25 mg daily. She will come back for followup lab work in one to 2 weeks.

## 2012-04-12 NOTE — Assessment & Plan Note (Signed)
Her chest pain is highly atypical and has been similar in the past. She has undergone stress testing which has been negative. I do not think we need to repeat this

## 2012-04-12 NOTE — Patient Instructions (Signed)

## 2012-04-12 NOTE — Assessment & Plan Note (Signed)
The patient has had moderate tricuspid regurgitation. I do not appreciate a tricuspid regurgitation murmur on her exam. She does note symptoms of abdominal swelling and shortness of breath and I think this should be rechecked. We will repeat a 2-D echocardiogram to make sure that she does not have worsening pulmonary hypertension or tricuspid regurgitation.

## 2012-04-12 NOTE — Patient Instructions (Addendum)
Your physician has recommended you make the following change in your medication: START HCTZ 25mg  take one by mouth daily  Your physician recommends that you return for lab work in: 1-2 [facility]  Your physician has requested that you have an echocardiogram. Echocardiography is a painless test that uses sound waves to create images of your heart. It provides your doctor with information about the size and shape of your heart and how well your heart's chambers and valves are working. This procedure takes approximately one hour. There are no restrictions for this procedure.  Your physician wants you to follow-up in: 1 YEAR with Dr Excell Seltzer.  You will receive a reminder letter in the mail two months in advance. If you don't receive a letter, please call our office to schedule the follow-up appointment.

## 2012-04-12 NOTE — Progress Notes (Signed)
HPI:  61 year old woman presenting for followup evaluation. She's had chest pain, palpitations, and has been noted to have tricuspid regurgitation by cardiac echo. Her last echocardiogram from November 2012 showed a left ventricular ejection fraction of 60-65%. There was moderate tricuspid regurgitation noted with an estimated PA pressure of 40 mm mercury.  She does not feel well. She has multiple complaints today. She's been having a lot of problems with congestion and headaches. She's completed courses of antibiotics and apparently has undergone some neuro imaging as well. She complains of generalized fatigue. She feels like her abdomen is bloated and her appetite is decreased. She has not had leg swelling. She complains of shortness of breath with activity but denies orthopnea or PND. She's had chest pains across the center of her chest for the last week but these are nonexertional. He feels like a sharp twisting type pain.  Outpatient Encounter Prescriptions as of 04/12/2012  Medication Sig Dispense Refill  . Calcium Carbonate (CALCIUM 600 PO) Take by mouth.        . Cholecalciferol (VITAMIN D) 2000 UNITS CAPS Take by mouth.        . diazepam (VALIUM) 10 MG tablet Take 10 mg by mouth every 12 (twelve) hours as needed.      . diltiazem (CARDIZEM CD) 180 MG 24 hr capsule Take 1 capsule (180 mg total) by mouth daily.      Marland Kitchen HYDROcodone-acetaminophen (NORCO/VICODIN) 5-325 MG per tablet Take 1 tablet by mouth every 8 (eight) hours as needed.  60 tablet  1  . lactobacillus acidophilus & bulgar (LACTINEX) chewable tablet Chew 2 tablets by mouth 3 (three) times daily with meals.  180 tablet  0  . Lactobacillus-Inulin (CULTURELLE DIGESTIVE HEALTH) CAPS Take by mouth. 1tab daily      . ranitidine (ZANTAC) 300 MG tablet Take 1 tablet (300 mg total) by mouth at bedtime.  90 tablet  3  . sertraline (ZOLOFT) 100 MG tablet Take 1 tablet (100 mg total) by mouth daily.  30 tablet  1  . triamcinolone cream  (KENALOG) 0.1 % Apply topically 2 (two) times daily as needed.  45 g  0  . promethazine (PHENERGAN) 25 MG tablet Take 1 tablet (25 mg total) by mouth every 8 (eight) hours as needed for nausea.  20 tablet  1  . DISCONTD: azithromycin (ZITHROMAX) 250 MG tablet Take 1 tablet (250 mg total) by mouth daily.  6 each  0  . DISCONTD: guaiFENesin (MUCINEX) 600 MG 12 hr tablet Take 1 tablet (600 mg total) by mouth 2 (two) times daily.  20 tablet  0    Allergies  Allergen Reactions  . Sulfamethoxazole Shortness Of Breath    chest tightness  . Iohexol   . Iodinated Diagnostic Agents     Pt states at Dr.Grapeys office 15 years ago blacked out for 2 hours during injection for IVP. Was told never to have IV dye again.     Past Medical History  Diagnosis Date  . GERD (gastroesophageal reflux disease)   . Depression   . Anxiety   . Insomnia   . IBS (irritable bowel syndrome)   . Diverticulosis   . Barrett esophagus   . Hiatal hernia   . Unspecified menopausal and postmenopausal disorder   . Vitamin B12 deficiency   . Blind loop syndrome   . Hepatic cyst   . Unspecified hypothyroidism   . Diarrhea   . Family history of malignant neoplasm of gastrointestinal tract  multiple members  . Endometrial hyperplasia 02/27/2006    BENIGN ENDO BX ON 02/2007  . C. difficile diarrhea   . Osteoporosis 05/2011    -2.5 Lt femoral neck  . Cystocele, midline   . Rectocele   . Uterine prolapse without mention of vaginal wall prolapse   . Chest pain, atypical 07/14/2011  . Leaky heart valve   . Hiatal hernia   . 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 Thrush 07/21/2011  . Rectal bleeding 10/10/2011  . Sinusitis acute 02/03/2012  . Contact dermatitis 03/23/2012    ROS: Negative except as per HPI  BP 152/81  Pulse 71  Ht 5\' 5"  (1.651 m)  Wt 158 lb (71.668 kg)  BMI 26.29 kg/m2  PHYSICAL EXAM: Pt is alert and oriented, NAD HEENT: normal Neck: JVP - normal, carotids 2+= without bruits Lungs: CTA  bilaterally CV: RRR without murmur or gallop Abd: soft, NT, Positive BS, no hepatomegaly Ext: no C/C/E, distal pulses intact and equal Skin: warm/dry no rash  EKG:  Normal sinus rhythm 71 beats per minute, incomplete right bundle branch block.  ASSESSMENT AND PLAN:

## 2012-04-14 ENCOUNTER — Telehealth: Payer: Self-pay | Admitting: Family Medicine

## 2012-04-14 NOTE — Telephone Encounter (Signed)
Left a message for patient to return my call. 

## 2012-04-14 NOTE — Telephone Encounter (Signed)
If she has a kidney stone she should hydrate well, may use her Diazepam and Hydrocodone over night to manage the pain and when she comes in in the morning for a BMET we can check a UA as well to look for blood. If she stops urinating over night or develops fevers she may need to go get looked at.

## 2012-04-14 NOTE — Telephone Encounter (Signed)
Patient states she feels like she has a kidney stone. She is having a lot of pain in her lower right back which started last night but is worse today. She is not having any urinary issues. Does Dr. Abner Greenspan have any recommendations on what she should do?

## 2012-04-15 ENCOUNTER — Encounter: Payer: Self-pay | Admitting: Family Medicine

## 2012-04-15 ENCOUNTER — Telehealth: Payer: Self-pay

## 2012-04-15 ENCOUNTER — Other Ambulatory Visit (INDEPENDENT_AMBULATORY_CARE_PROVIDER_SITE_OTHER): Payer: Self-pay

## 2012-04-15 DIAGNOSIS — R0789 Other chest pain: Secondary | ICD-10-CM

## 2012-04-15 DIAGNOSIS — I10 Essential (primary) hypertension: Secondary | ICD-10-CM

## 2012-04-15 DIAGNOSIS — R51 Headache: Secondary | ICD-10-CM

## 2012-04-15 DIAGNOSIS — I079 Rheumatic tricuspid valve disease, unspecified: Secondary | ICD-10-CM

## 2012-04-15 DIAGNOSIS — I071 Rheumatic tricuspid insufficiency: Secondary | ICD-10-CM

## 2012-04-15 DIAGNOSIS — R109 Unspecified abdominal pain: Secondary | ICD-10-CM

## 2012-04-15 DIAGNOSIS — R519 Headache, unspecified: Secondary | ICD-10-CM | POA: Insufficient documentation

## 2012-04-15 DIAGNOSIS — R319 Hematuria, unspecified: Secondary | ICD-10-CM

## 2012-04-15 HISTORY — DX: Headache: R51

## 2012-04-15 LAB — BASIC METABOLIC PANEL
CO2: 29 mEq/L (ref 19–32)
Calcium: 9 mg/dL (ref 8.4–10.5)
Chloride: 97 mEq/L (ref 96–112)
Glucose, Bld: 88 mg/dL (ref 70–99)
Potassium: 3.3 mEq/L — ABNORMAL LOW (ref 3.5–5.1)
Sodium: 135 mEq/L (ref 135–145)

## 2012-04-15 LAB — POCT URINALYSIS DIPSTICK
Bilirubin, UA: NEGATIVE
Glucose, UA: NEGATIVE
Ketones, UA: NEGATIVE
Spec Grav, UA: 1.01

## 2012-04-15 NOTE — Progress Notes (Signed)
Patient ID: Megan [name], female   DOB: Dec 02, 1950, 61 y.o.   MRN: [id number] Megan [name] [id number] [birth year] 04/15/2012      Progress Note-Follow Up  Subjective  Chief Complaint  Chief Complaint  Patient presents with  . Follow-up    on cluster headaches    HPI  Patient is a 61 year old Caucasian female who is in today complaining of persistent left-sided headache. We've recently treated her for sinusitis and we will refer her to ENT. In she reports they feel there is no sinusitis at this time but the headaches persist and they've told her they think it's cluster headaches. She complains of a vascular pain being over the left eye. Says over the last week there've been times nearly debilitating. She says her teeth hurt as well as her face. No significant nasal congestion, sore throat, ear pain. No chest pain, palpitations, shortness of breath, GU complaints at this time. Overall her bowels continue to improve.  Past Medical History  Diagnosis Date  . GERD (gastroesophageal reflux disease)   . Depression   . Anxiety   . Insomnia   . IBS (irritable bowel syndrome)   . Diverticulosis   . Barrett esophagus   . Hiatal hernia   . Unspecified menopausal and postmenopausal disorder   . Vitamin B12 deficiency   . Blind loop syndrome   . Hepatic cyst   . Unspecified hypothyroidism   . Diarrhea   . Family history of malignant neoplasm of gastrointestinal tract     multiple members  . Endometrial hyperplasia 02/27/2006    BENIGN ENDO BX ON 02/2007  . C. difficile diarrhea   . Osteoporosis 05/2011    -2.5 Lt femoral neck  . Cystocele, midline   . Rectocele   . Uterine prolapse without mention of vaginal wall prolapse   . Chest pain, atypical 07/14/2011  . Leaky heart valve   . Hiatal hernia   . 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 Thrush 07/21/2011  . Rectal bleeding 10/10/2011  . Sinusitis acute 02/03/2012  . Contact dermatitis 03/23/2012  . Headache 04/15/2012     Past Surgical History  Procedure Date  . Appendectomy   . Uterine fibroid surgery   . Hysteroscopy     POLYP    Family History  Problem Relation Age of Onset  . Colon cancer Mother   . Diabetes Mother   . Cancer Mother     colon  . Hypertension Mother   . Colon polyps Father   . Colon cancer Maternal Aunt   . Colon cancer Maternal Grandmother   . Breast cancer Maternal Grandmother   . Diabetes Paternal Grandmother   . Breast cancer Paternal Grandmother   . Colon cancer Cousin     X3  . Ovarian cancer Cousin     History   Social History  . Marital Status: Married    Spouse Name: N/A    Number of Children: 4  . Years of Education: N/A   Occupational History  . Retired    Social History Main Topics  . Smoking status: Former Smoker    Quit date: 08/19/1995  . Smokeless tobacco: Never Used  . Alcohol Use: No  . Drug Use: No  . Sexually Active: No   Other Topics Concern  . Not on file   Social History Narrative  . No narrative on file    Current Outpatient Prescriptions on File Prior to Visit  Medication Sig Dispense Refill  . Calcium Carbonate (CALCIUM 600  PO) Take by mouth.        . Cholecalciferol (VITAMIN D) 2000 UNITS CAPS Take by mouth.        . diazepam (VALIUM) 10 MG tablet Take 10 mg by mouth every 12 (twelve) hours as needed.      . diltiazem (CARDIZEM CD) 180 MG 24 hr capsule Take 1 capsule (180 mg total) by mouth daily.      . hydrochlorothiazide (HYDRODIURIL) 25 MG tablet Take 1 tablet (25 mg total) by mouth daily.  90 tablet  3  . HYDROcodone-acetaminophen (NORCO/VICODIN) 5-325 MG per tablet Take 1 tablet by mouth every 8 (eight) hours as needed.  60 tablet  1  . lactobacillus acidophilus & bulgar (LACTINEX) chewable tablet Chew 2 tablets by mouth 3 (three) times daily with meals.  180 tablet  0  . Lactobacillus-Inulin (CULTURELLE DIGESTIVE HEALTH) CAPS Take by mouth. 1tab daily      . ranitidine (ZANTAC) 300 MG tablet Take 1 tablet (300 mg  total) by mouth at bedtime.  90 tablet  3  . sertraline (ZOLOFT) 100 MG tablet Take 1 tablet (100 mg total) by mouth daily.  30 tablet  1  . triamcinolone cream (KENALOG) 0.1 % Apply topically 2 (two) times daily as needed.  45 g  0  . nebivolol (BYSTOLIC) 5 MG tablet Take 1 tablet (5 mg total) by mouth daily.  30 tablet  0  . promethazine (PHENERGAN) 25 MG tablet Take 1 tablet (25 mg total) by mouth every 8 (eight) hours as needed for nausea.  20 tablet  1    Allergies  Allergen Reactions  . Sulfamethoxazole Shortness Of Breath    chest tightness  . Iohexol   . Iodinated Diagnostic Agents     Pt states at Dr.Grapeys office 15 years ago blacked out for 2 hours during injection for IVP. Was told never to have IV dye again.     Review of Systems  Review of Systems  Constitutional: Negative for fever and malaise/fatigue.  HENT: Positive for congestion.   Eyes: Negative for discharge.  Respiratory: Negative for cough and shortness of breath.   Cardiovascular: Negative for chest pain, palpitations and leg swelling.  Gastrointestinal: Negative for nausea, abdominal pain and diarrhea.  Genitourinary: Negative for dysuria.  Musculoskeletal: Negative for falls.  Skin: Negative for rash.  Neurological: Positive for headaches. Negative for loss of consciousness.  Endo/Heme/Allergies: Negative for polydipsia.  Psychiatric/Behavioral: Negative for depression and suicidal ideas. The patient is not nervous/anxious and does not have insomnia.     Objective  BP 142/84  Pulse 90  Temp 97.9 F (36.6 C) (Temporal)  Ht 5\' 5"  (1.651 m)  Wt 159 lb 6.4 oz (72.303 kg)  BMI 26.53 kg/m2  SpO2 98%  Physical Exam  Physical Exam  Constitutional: She is oriented to person, place, and time and well-developed, well-nourished, and in no distress. No distress.  HENT:  Head: Normocephalic and atraumatic.  Eyes: Conjunctivae are normal.  Neck: Neck supple. No thyromegaly present.  Cardiovascular:  Normal rate, regular rhythm and normal heart sounds.   No murmur heard. Pulmonary/Chest: Effort normal and breath sounds normal. She has no wheezes.  Abdominal: She exhibits no distension and no mass.  Musculoskeletal: She exhibits no edema.  Lymphadenopathy:    She has no cervical adenopathy.  Neurological: She is alert and oriented to person, place, and time.  Skin: Skin is warm and dry. No rash noted. She is not diaphoretic.  Psychiatric: Memory, affect and judgment  normal.    Lab Results  Component Value Date   TSH 0.66 12/13/2010   Lab Results  Component Value Date   WBC 8.6 10/03/2011   HGB 14.6 10/03/2011   HCT 42.4 10/03/2011   MCV 89.8 10/03/2011   PLT 274 10/03/2011   Lab Results  Component Value Date   CREATININE 0.7 04/15/2012   BUN 8 04/15/2012   NA 135 04/15/2012   K 3.3* 04/15/2012   CL 97 04/15/2012   CO2 29 04/15/2012   Lab Results  Component Value Date   ALT 25 10/03/2011   AST 35 10/03/2011   ALKPHOS 107 10/03/2011   BILITOT 0.3 10/03/2011   Lab Results  Component Value Date   CHOL 218* 06/30/2011   Lab Results  Component Value Date   HDL 55.80 06/30/2011   Lab Results  Component Value Date   LDLCALC 112 11/21/2010   Lab Results  Component Value Date   TRIG 80.0 06/30/2011   Lab Results  Component Value Date   CHOLHDL 4 06/30/2011     Assessment & Plan  HYPERTENSION Improved on recheck   Headache She has been seen by ENT they have ruled out sinusitis as a cause of her headaches. They have labeled them cluster HA, they are centered over the left eye. We will proceed with MRI to rule out any more serious pathology due to the intensity of the HA she describes as almost unbearable  Sinusitis acute resolved

## 2012-04-15 NOTE — Assessment & Plan Note (Signed)
She has been seen by ENT they have ruled out sinusitis as a cause of her headaches. They have labeled them cluster HA, they are centered over the left eye. We will proceed with MRI to rule out any more serious pathology due to the intensity of the HA she describes as almost unbearable

## 2012-04-15 NOTE — Telephone Encounter (Signed)
Patient has an appt 04/15/12.

## 2012-04-15 NOTE — Assessment & Plan Note (Signed)
resolved 

## 2012-04-15 NOTE — Progress Notes (Signed)
Quick Note:  Patient Informed and voiced understanding ______ 

## 2012-04-15 NOTE — Telephone Encounter (Signed)
She can have Norco 10/325 1 tab po q 6 hour prn pain for the weekend, #30 but no refills this is a controlled substance so if she needs any more she needs to come in to sign a contract

## 2012-04-15 NOTE — Telephone Encounter (Signed)
Patient left a message stating that she would like something stronger called in because the Vicodin and other medication is not working- to [facility].   I spoke with pt and repeated to her what MD stated in previous note. I also informed pt that MD was off this afternoon and I would send the note but she might want to get on the schedule for tomorrow just incase MD would want her to come in. Pt stated she didn't want to schedule an appt because she has been in to many times recently. I informed pt that I would send this note to MD and Judeth Cornfield would give her a call tomorrow and let her know if something else could be sent in. Pt voiced understanding. Please advise?

## 2012-04-15 NOTE — Assessment & Plan Note (Signed)
Improved on recheck.  

## 2012-04-16 MED ORDER — HYDROCODONE-ACETAMINOPHEN 10-325 MG PO TABS: 1.0000 | ORAL_TABLET | Freq: Four times a day (QID) | ORAL | Status: DC | PRN

## 2012-04-16 NOTE — Telephone Encounter (Signed)
Pt notified RX sent to [facility].

## 2012-04-16 NOTE — Telephone Encounter (Signed)
Patient returned Stephanie's call.

## 2012-04-16 NOTE — Telephone Encounter (Signed)
I have attempted to contact this patient by phone with the following results: left message to return my call on answering machine.

## 2012-04-17 LAB — URINE CULTURE

## 2012-04-20 NOTE — Progress Notes (Signed)
Quick Note:  Patient Informed and voiced understanding. Pt states she is still in pain on both sides. Pt is going to try and stick it out for a few more days. ______

## 2012-04-26 ENCOUNTER — Ambulatory Visit (HOSPITAL_COMMUNITY): Payer: Self-pay | Attending: Internal Medicine

## 2012-04-26 ENCOUNTER — Other Ambulatory Visit (INDEPENDENT_AMBULATORY_CARE_PROVIDER_SITE_OTHER): Payer: Self-pay

## 2012-04-26 DIAGNOSIS — I059 Rheumatic mitral valve disease, unspecified: Secondary | ICD-10-CM | POA: Insufficient documentation

## 2012-04-26 DIAGNOSIS — I071 Rheumatic tricuspid insufficiency: Secondary | ICD-10-CM

## 2012-04-26 DIAGNOSIS — I10 Essential (primary) hypertension: Secondary | ICD-10-CM

## 2012-04-26 DIAGNOSIS — I379 Nonrheumatic pulmonary valve disorder, unspecified: Secondary | ICD-10-CM | POA: Insufficient documentation

## 2012-04-26 DIAGNOSIS — R0789 Other chest pain: Secondary | ICD-10-CM

## 2012-04-26 DIAGNOSIS — I079 Rheumatic tricuspid valve disease, unspecified: Secondary | ICD-10-CM

## 2012-04-26 DIAGNOSIS — I369 Nonrheumatic tricuspid valve disorder, unspecified: Secondary | ICD-10-CM | POA: Insufficient documentation

## 2012-04-26 LAB — BASIC METABOLIC PANEL
BUN: 8 mg/dL (ref 6–23)
CO2: 30 mEq/L (ref 19–32)
Calcium: 9.4 mg/dL (ref 8.4–10.5)
Creatinine, Ser: 0.6 mg/dL (ref 0.4–1.2)
Glucose, Bld: 103 mg/dL — ABNORMAL HIGH (ref 70–99)

## 2012-04-26 NOTE — Progress Notes (Signed)
Echocardiogram performed.  

## 2012-04-27 ENCOUNTER — Ambulatory Visit (INDEPENDENT_AMBULATORY_CARE_PROVIDER_SITE_OTHER): Payer: Self-pay | Admitting: Family Medicine

## 2012-04-27 ENCOUNTER — Encounter: Payer: Self-pay | Admitting: Family Medicine

## 2012-04-27 VITALS — BP 140/84 | HR 97 | Temp 97.6°F | Ht 65.0 in | Wt 159.0 lb

## 2012-04-27 DIAGNOSIS — M533 Sacrococcygeal disorders, not elsewhere classified: Secondary | ICD-10-CM

## 2012-04-27 DIAGNOSIS — I10 Essential (primary) hypertension: Secondary | ICD-10-CM

## 2012-04-27 DIAGNOSIS — R319 Hematuria, unspecified: Secondary | ICD-10-CM

## 2012-04-27 DIAGNOSIS — R109 Unspecified abdominal pain: Secondary | ICD-10-CM

## 2012-04-27 DIAGNOSIS — G43909 Migraine, unspecified, not intractable, without status migrainosus: Secondary | ICD-10-CM

## 2012-04-27 HISTORY — DX: Sacrococcygeal disorders, not elsewhere classified: M53.3

## 2012-04-27 HISTORY — DX: Hematuria, unspecified: R31.9

## 2012-04-27 LAB — POCT URINALYSIS DIPSTICK
Blood, UA: NEGATIVE
Glucose, UA: NEGATIVE
Protein, UA: NEGATIVE
Spec Grav, UA: 1.015
Urobilinogen, UA: 0.2
pH, UA: 7.5

## 2012-04-27 MED ORDER — CYCLOBENZAPRINE HCL 10 MG PO TABS: 10.0000 mg | ORAL_TABLET | Freq: Three times a day (TID) | ORAL | Status: AC | PRN

## 2012-04-27 MED ORDER — HYDROCODONE-ACETAMINOPHEN 10-325 MG PO TABS: 1.0000 | ORAL_TABLET | Freq: Four times a day (QID) | ORAL | Status: AC | PRN

## 2012-04-27 NOTE — Patient Instructions (Addendum)
Sacroiliac Joint Dysfunction The sacroiliac joint connects the lower part of the spine (the sacrum) with the bones of the pelvis. CAUSES  Sometimes, there is no obvious reason for sacroiliac joint dysfunction. Other times, it may occur   During pregnancy.   After injury, such as:   Car accidents.   Sport-related injuries.   Work-related injuries.   Due to one leg being shorter than the other.   Due to other conditions that affect the joints, such as:   Rheumatoid arthritis.   Gout.   Psoriasis.   Joint infection (septic arthritis).  SYMPTOMS  Symptoms may include:  Pain in the:   Lower back.   Buttocks.   Groin.   Thighs and legs.   Difficult sitting, standing, walking, lying, bending or lifting.  DIAGNOSIS  A number of tests may be used to help diagnose the cause of sacroiliac joint dysfunction, including:  Imaging tests to look for other causes of pain, including:   MRI.   CT scan.   Bone scan.   Diagnostic injection: During a special x-ray (called fluoroscopy), a needle is put into the sacroiliac joint. A numbing medicine is injected into the joint. If the pain is improved or stopped, the diagnosis of sacroiliac joint dysfunction is more likely.  TREATMENT  There are a number of types of treatment used for sacroiliac joint dysfunction, including:  Only take over-the-counter or prescription medicines for pain, discomfort, or fever as directed by your caregiver.   Medications to relax muscles.   Rest. Decreasing activity can help cut down on painful muscle spasms and allow the back to heal.   Application of heat or ice to the lower back may improve muscle spasms and soothe pain.   Brace. A special back brace, called a sacroiliac belt, can help support the joint while your back is healing.   Physical therapy can help teach comfortable positions and exercises to strengthen muscles that support the sacroiliac joint.   Cortisone injections. Injections  of steroid medicine into the joint can help decrease swelling and improve pain.   Hyaluronic acid injections. This chemical improves lubrication within the sacroiliac joint, thereby decreasing pain.   Radiofrequency ablation. A special needle is placed into the joint, where it burns away nerves that are carrying pain messages from the joint.   Surgery. Because pain occurs during movement of the joint, screws and plates may be installed in order to limit or prevent joint motion.  HOME CARE INSTRUCTIONS   Take all medications exactly as directed.   Follow instructions regarding both rest and physical activity, to avoid worsening the pain.   Do physical therapy exercises exactly as prescribed.  SEEK IMMEDIATE MEDICAL CARE IF:  You experience increasingly severe pain.   You develop new symptoms, such as numbness or tingling in your legs or feet.   You lose bladder or bowel control.  Document Released: 10/31/2008 Document Revised: 07/24/2011 Document Reviewed: 10/31/2008 [employer] Patient Information 2012 [employer]. 

## 2012-04-27 NOTE — Progress Notes (Signed)
Quick Note:  Patient Informed and voiced understanding ______ 

## 2012-04-27 NOTE — Assessment & Plan Note (Signed)
Improved on repeat, given samples of Bystolic 10 mg to take 1/2 tab po daily, continue other meds

## 2012-04-27 NOTE — Assessment & Plan Note (Signed)
Resolved on recheck today 

## 2012-04-27 NOTE — Assessment & Plan Note (Signed)
Moist heat, gentle stretching, cyclobenzaprine tid prn and report if no improvement

## 2012-04-27 NOTE — Progress Notes (Signed)
Patient ID: Megan [name], female   DOB: 03/30/1951, 61 y.o.   MRN: [id number] DEREKA LUERAS [id number] 24-Apr-1951 04/27/2012      Progress Note-Follow Up  Subjective  Chief Complaint  Chief Complaint  Patient presents with  . Follow-up    2 week    HPI  Patient is a 61 year old Caucasian female who is in today for followup. Her headaches have resolved and she did not choose to proceed with the MRI. She continues to have trouble with right hip pain. The pain is posterior and initially she thought was kidney stones but is worse with position changes with palpation over the sacroiliac joint. She denies fevers chills. She denies polyuria, dysuria, hematuria. No chest pain, palpitations, shortness of breath, GI or GU complaints.  Past Medical History  Diagnosis Date  . GERD (gastroesophageal reflux disease)   . Depression   . Anxiety   . Insomnia   . IBS (irritable bowel syndrome)   . Diverticulosis   . Barrett esophagus   . Hiatal hernia   . Unspecified menopausal and postmenopausal disorder   . Vitamin B12 deficiency   . Blind loop syndrome   . Hepatic cyst   . Unspecified hypothyroidism   . Diarrhea   . Family history of malignant neoplasm of gastrointestinal tract     multiple members  . Endometrial hyperplasia 02/27/2006    BENIGN ENDO BX ON 02/2007  . C. difficile diarrhea   . Osteoporosis 05/2011    -2.5 Lt femoral neck  . Cystocele, midline   . Rectocele   . Uterine prolapse without mention of vaginal wall prolapse   . Chest pain, atypical 07/14/2011  . Leaky heart valve   . Hiatal hernia   . 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 Thrush 07/21/2011  . Rectal bleeding 10/10/2011  . Sinusitis acute 02/03/2012  . Contact dermatitis 03/23/2012  . Headache 04/15/2012  . 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 Hematuria 04/27/2012    Past Surgical History  Procedure Date  . Appendectomy   . Uterine fibroid surgery   . Hysteroscopy     POLYP    Family  History  Problem Relation Age of Onset  . Colon cancer Mother   . Diabetes Mother   . Cancer Mother     colon  . Hypertension Mother   . Colon polyps Father   . Colon cancer Maternal Aunt   . Colon cancer Maternal Grandmother   . Breast cancer Maternal Grandmother   . Diabetes Paternal Grandmother   . Breast cancer Paternal Grandmother   . Colon cancer Cousin     X3  . Ovarian cancer Cousin     History   Social History  . Marital Status: Married    Spouse Name: N/A    Number of Children: 4  . Years of Education: N/A   Occupational History  . Retired    Social History Main Topics  . Smoking status: Former Smoker    Quit date: 08/19/1995  . Smokeless tobacco: Never Used  . Alcohol Use: No  . Drug Use: No  . Sexually Active: No   Other Topics Concern  . Not on file   Social History Narrative  . No narrative on file    Current Outpatient Prescriptions on File Prior to Visit  Medication Sig Dispense Refill  . Calcium Carbonate (CALCIUM 600 PO) Take by mouth.        . Cholecalciferol (VITAMIN D) 2000 UNITS CAPS Take by mouth.        Marland Kitchen  diazepam (VALIUM) 10 MG tablet Take 10 mg by mouth every 12 (twelve) hours as needed.      . diltiazem (CARDIZEM CD) 180 MG 24 hr capsule Take 1 capsule (180 mg total) by mouth daily.      . hydrochlorothiazide (HYDRODIURIL) 25 MG tablet Take 1 tablet (25 mg total) by mouth daily.  90 tablet  3  . lactobacillus acidophilus & bulgar (LACTINEX) chewable tablet Chew 2 tablets by mouth 3 (three) times daily with meals.  180 tablet  0  . Lactobacillus-Inulin (CULTURELLE DIGESTIVE HEALTH) CAPS Take by mouth. 1tab daily      . nebivolol (BYSTOLIC) 5 MG tablet Take 1 tablet (5 mg total) by mouth daily.  30 tablet  0  . ranitidine (ZANTAC) 300 MG tablet Take 1 tablet (300 mg total) by mouth at bedtime.  90 tablet  3  . sertraline (ZOLOFT) 100 MG tablet Take 1 tablet (100 mg total) by mouth daily.  30 tablet  1  . triamcinolone cream (KENALOG)  0.1 % Apply topically 2 (two) times daily as needed.  45 g  0  . promethazine (PHENERGAN) 25 MG tablet Take 1 tablet (25 mg total) by mouth every 8 (eight) hours as needed for nausea.  20 tablet  1    Allergies  Allergen Reactions  . Sulfamethoxazole Shortness Of Breath    chest tightness  . Iohexol   . Iodinated Diagnostic Agents     Pt states at Dr.Grapeys office 15 years ago blacked out for 2 hours during injection for IVP. Was told never to have IV dye again.     Review of Systems  Review of Systems  Constitutional: Negative for fever and malaise/fatigue.  HENT: Negative for congestion.   Eyes: Negative for discharge.  Respiratory: Negative for shortness of breath.   Cardiovascular: Negative for chest pain, palpitations and leg swelling.  Gastrointestinal: Negative for nausea, abdominal pain and diarrhea.  Genitourinary: Negative for dysuria.  Musculoskeletal: Positive for joint pain. Negative for falls.       Right posterior hip pain, worse with position changes  Skin: Negative for rash.  Neurological: Negative for loss of consciousness and headaches.  Endo/Heme/Allergies: Negative for polydipsia.  Psychiatric/Behavioral: Negative for depression and suicidal ideas. The patient is not nervous/anxious and does not have insomnia.     Objective  BP 140/84  Pulse 97  Temp 97.6 F (36.4 C) (Temporal)  Ht 5\' 5"  (1.651 m)  Wt 159 lb (72.122 kg)  BMI 26.46 kg/m2  SpO2 97%  Physical Exam  Physical Exam  Constitutional: She is oriented to person, place, and time and well-developed, well-nourished, and in no distress. No distress.  HENT:  Head: Normocephalic and atraumatic.  Eyes: Conjunctivae are normal.  Neck: Neck supple. No thyromegaly present.  Cardiovascular: Normal rate, regular rhythm and normal heart sounds.   No murmur heard. Pulmonary/Chest: Effort normal and breath sounds normal. She has no wheezes.  Abdominal: She exhibits no distension and no mass.    Musculoskeletal: She exhibits tenderness. She exhibits no edema.       Pain with palp over posterior right hip  Lymphadenopathy:    She has no cervical adenopathy.  Neurological: She is alert and oriented to person, place, and time.  Skin: Skin is warm and dry. No rash noted. She is not diaphoretic.  Psychiatric: Memory, affect and judgment normal.    Lab Results  Component Value Date   TSH 0.66 12/13/2010   Lab Results  Component Value Date  WBC 8.6 10/03/2011   HGB 14.6 10/03/2011   HCT 42.4 10/03/2011   MCV 89.8 10/03/2011   PLT 274 10/03/2011   Lab Results  Component Value Date   CREATININE 0.6 04/26/2012   BUN 8 04/26/2012   NA 137 04/26/2012   K 3.6 04/26/2012   CL 100 04/26/2012   CO2 30 04/26/2012   Lab Results  Component Value Date   ALT 25 10/03/2011   AST 35 10/03/2011   ALKPHOS 107 10/03/2011   BILITOT 0.3 10/03/2011   Lab Results  Component Value Date   CHOL 218* 06/30/2011   Lab Results  Component Value Date   HDL 55.80 06/30/2011   Lab Results  Component Value Date   LDLCALC 112 11/21/2010   Lab Results  Component Value Date   TRIG 80.0 06/30/2011   Lab Results  Component Value Date   CHOLHDL 4 06/30/2011     Assessment & Plan  MIGRAINE HEADACHE Headache resolved since last visit, she chose not to proceed with MRI. Will hold on further work up for now unless pain returns  HYPERTENSION Improved on repeat, given samples of Bystolic 10 mg to take 1/2 tab po daily, continue other meds  Sacroiliac joint disease Moist heat, gentle stretching, cyclobenzaprine tid prn and report if no improvement  Hematuria Resolved on recheck today

## 2012-04-27 NOTE — Assessment & Plan Note (Signed)
Headache resolved since last visit, she chose not to proceed with MRI. Will hold on further work up for now unless pain returns

## 2012-04-28 ENCOUNTER — Encounter: Payer: Self-pay | Admitting: Cardiovascular Disease

## 2012-04-28 NOTE — Telephone Encounter (Signed)
Patient returning nurse call for lab results, she can be reached at [phone]

## 2012-04-28 NOTE — Telephone Encounter (Signed)
This encounter was created in error - please disregard.

## 2012-05-19 ENCOUNTER — Other Ambulatory Visit: Payer: Self-pay

## 2012-05-19 MED ORDER — ALBUTEROL SULFATE HFA 108 (90 BASE) MCG/ACT IN AERS: 2.0000 | INHALATION_SPRAY | Freq: Four times a day (QID) | RESPIRATORY_TRACT | Status: DC | PRN

## 2012-05-19 NOTE — Telephone Encounter (Signed)
Verbal ok from MD to refill Ventolin #1 with 1 refill

## 2012-05-26 ENCOUNTER — Other Ambulatory Visit: Payer: Self-pay | Admitting: Family Medicine

## 2012-05-27 ENCOUNTER — Encounter: Payer: Self-pay | Admitting: Gynecology

## 2012-05-27 ENCOUNTER — Other Ambulatory Visit: Payer: Self-pay | Admitting: Gynecology

## 2012-05-27 ENCOUNTER — Ambulatory Visit (INDEPENDENT_AMBULATORY_CARE_PROVIDER_SITE_OTHER): Payer: Self-pay | Admitting: Gynecology

## 2012-05-27 VITALS — BP 148/82 | Ht 66.0 in | Wt 159.0 lb

## 2012-05-27 DIAGNOSIS — M810 Age-related osteoporosis without current pathological fracture: Secondary | ICD-10-CM

## 2012-05-27 DIAGNOSIS — N949 Unspecified condition associated with female genital organs and menstrual cycle: Secondary | ICD-10-CM

## 2012-05-27 DIAGNOSIS — R102 Pelvic and perineal pain: Secondary | ICD-10-CM

## 2012-05-27 DIAGNOSIS — N952 Postmenopausal atrophic vaginitis: Secondary | ICD-10-CM

## 2012-05-27 DIAGNOSIS — Z01419 Encounter for gynecological examination (general) (routine) without abnormal findings: Secondary | ICD-10-CM

## 2012-05-27 LAB — COMPREHENSIVE METABOLIC PANEL
Albumin: 4.4 g/dL (ref 3.5–5.2)
Alkaline Phosphatase: 99 U/L (ref 39–117)
BUN: 9 mg/dL (ref 6–23)
Glucose, Bld: 107 mg/dL — ABNORMAL HIGH (ref 70–99)
Potassium: 3.6 mEq/L (ref 3.5–5.3)

## 2012-05-27 LAB — URINALYSIS W MICROSCOPIC + REFLEX CULTURE
Bilirubin Urine: NEGATIVE
Casts: NONE SEEN
Crystals: NONE SEEN
Ketones, ur: NEGATIVE mg/dL
Nitrite: NEGATIVE
Specific Gravity, Urine: 1.015 (ref 1.005–1.030)
pH: 6.5 (ref 5.0–8.0)

## 2012-05-27 NOTE — Patient Instructions (Signed)
Office will call you to arrange IV bone medication

## 2012-05-27 NOTE — Progress Notes (Signed)
Megan [name] May 23, 1951 [id number]        61 y.o.  [id number] for annual exam.  Several issues noted below  Past medical history,surgical history, medications, allergies, family history and social history were all reviewed and documented in the EPIC chart. ROS:  Was performed and pertinent positives and negatives are included in the history.  Exam: Fleet Contras assistant Filed Vitals:   05/27/12 1053  BP: 148/82  Height: 5\' 6"  (1.676 m)  Weight: 159 lb (72.122 kg)   General appearance  Normal Skin grossly normal Head/Neck normal with no cervical or supraclavicular adenopathy thyroid normal Lungs  clear Cardiac RR, without RMG Abdominal  soft, nontender, without masses, organomegaly or hernia Breasts  examined lying and sitting without masses, retractions, discharge or axillary adenopathy. Pelvic  Ext/BUS/vagina  normal with atrophic changes  Cervix  normal with atrophic changes  Uterus  anteverted, normal size, shape and contour, midline and mobile nontender   Adnexa  Without masses or tenderness    Anus and perineum  normal   Rectovaginal  normal sphincter tone without palpated masses or tenderness.    Assessment/Plan:  61 y.o. [id number] female for annual exam.   1. Osteoporosis.  DEXA 05/2011 shows osteoporosis -2.5 at the left femoral neck. In comparison to her prior study in 2010 there is a statistically significant decline in the right hip of 9.4% in the left hip of 4.1%, her AP spine is stable. I reviewed these findings with her previously as well as today and my recommendation that we should consider treatment. I reviewed the various treatment options to include oral bisphosphonates, IV bisphosphate, Prolia, Evista, HRT, Forteo and calcitonin. She was having a lot of issues with her gastrointestinal tract including a large hiatal hernia and I don't think that oral bisphosphonates would be a good choice.  she also was having issues with C. Difficile which have resolved. She's  currently having issues with chronic sinusitis. I'm not sure Prolia would be a great choice with the infection history and after a lengthy discussion I think IV Reclast would be a good choice. I discussed was involved with the administration as well as the risks to include atypical fractures osteonecrosis of the jaw possible GI issues as well as arrhythmias. We'll go ahead and pre-certify her for this and move towards administration. Secondary workup before was all negative with the exception of a low vitamin D. She is on 2000 units daily and we'll recheck her vitamin D level today along with a comprehensive metabolic panel. 2. Mammography. Patient is due November and I reminded her to schedule this. SBE monthly reviewed. 3. Pap smear. Present today. Last Pap smear 2012. No history of significant abnormalities and we'll plan less frequent screening. 4. Pelvic discomfort. Patients attributing to GYN. Was recently evaluated for hematuria and said that they found nothing. She was having problems with SUI previously but reports now that this is not an issue that she is going less frequently and voiding small amounts.  Her exam today is normal. She was followed for mild pelvic relaxation with mild cystocele rectocele uterine prolapse although exam today appears to be fairly well supported. Start with pelvic ultrasound. Recheck urinalysis and assuming studies negative will plan observation now. 5. Colonoscopy. 2011. We'll follow up with her gastroenterologist that she has been. 6. Health maintenance. No other lab work was done this is done through her primary physician's office who follows her for her other issues. Follow up for ultrasound and lab work ultimately Reclast injection  and then in one year.    [name] MD, 11:26 AM 05/27/2012

## 2012-05-27 NOTE — Telephone Encounter (Signed)
Please advise refill? Last RX wrote on 04-27-12 quantity 30 with 0 refills  If ok fax to [phone]

## 2012-05-27 NOTE — Telephone Encounter (Signed)
Faxed

## 2012-05-29 LAB — URINE CULTURE
Colony Count: NO GROWTH
Organism ID, Bacteria: NO GROWTH

## 2012-06-10 ENCOUNTER — Telehealth: Payer: Self-pay

## 2012-06-10 NOTE — Telephone Encounter (Signed)
pts husband came and picked up a bottle of Bystolic 5 mg and 1 bottle of Bystolic 10 mg to cut in half.

## 2012-06-10 NOTE — Telephone Encounter (Signed)
Pt left a message stating that she would like some Bystolic 5 mg samples.

## 2012-06-23 ENCOUNTER — Ambulatory Visit (INDEPENDENT_AMBULATORY_CARE_PROVIDER_SITE_OTHER): Payer: Self-pay | Admitting: Family Medicine

## 2012-06-23 ENCOUNTER — Encounter: Payer: Self-pay | Admitting: Family Medicine

## 2012-06-23 VITALS — BP 138/82 | HR 70 | Temp 98.4°F | Ht 65.0 in | Wt 161.0 lb

## 2012-06-23 DIAGNOSIS — R51 Headache: Secondary | ICD-10-CM

## 2012-06-23 DIAGNOSIS — Z23 Encounter for immunization: Secondary | ICD-10-CM

## 2012-06-23 DIAGNOSIS — K449 Diaphragmatic hernia without obstruction or gangrene: Secondary | ICD-10-CM

## 2012-06-23 DIAGNOSIS — F419 Anxiety disorder, unspecified: Secondary | ICD-10-CM

## 2012-06-23 DIAGNOSIS — I10 Essential (primary) hypertension: Secondary | ICD-10-CM

## 2012-06-23 DIAGNOSIS — K219 Gastro-esophageal reflux disease without esophagitis: Secondary | ICD-10-CM

## 2012-06-23 DIAGNOSIS — R000 Tachycardia, unspecified: Secondary | ICD-10-CM

## 2012-06-23 DIAGNOSIS — F341 Dysthymic disorder: Secondary | ICD-10-CM

## 2012-06-23 DIAGNOSIS — G4700 Insomnia, unspecified: Secondary | ICD-10-CM

## 2012-06-23 MED ORDER — DIAZEPAM 10 MG PO TABS: 10.0000 mg | ORAL_TABLET | Freq: Two times a day (BID) | ORAL | Status: DC | PRN

## 2012-06-23 MED ORDER — HYDROCODONE-ACETAMINOPHEN 5-325 MG PO TABS: 1.0000 | ORAL_TABLET | Freq: Four times a day (QID) | ORAL | Status: DC | PRN

## 2012-06-23 MED ORDER — NEBIVOLOL HCL 5 MG PO TABS: 5.0000 mg | ORAL_TABLET | Freq: Every day | ORAL | Status: DC

## 2012-06-23 MED ORDER — DILTIAZEM HCL ER COATED BEADS 120 MG PO CP24: 120.0000 mg | ORAL_CAPSULE | Freq: Every day | ORAL | Status: DC

## 2012-06-23 NOTE — Assessment & Plan Note (Signed)
Ranitidine and probiotics are helpful continue the same

## 2012-06-23 NOTE — Progress Notes (Signed)
Patient ID: Megan [name], female   DOB: June 30, 1951, 61 y.o.   MRN: [id number] ALMAROSA CAHOON [id number] Feb 08, 1951 06/23/2012      Progress Note-Follow Up  Subjective  Chief Complaint  Chief Complaint  Patient presents with  . Follow-up    2 month   . Injections    flu    HPI  Patient is a 61 year old Caucasian female who is in today for followup. She continues to have frequent left-sided headaches but has been seen by ENT and neurology no new concerns have been identified. The pain is mostly a dull ache without any associated symptoms. She does note overall she feels somewhat better. He acknowledges some persistent fatigue but denies chest pain, palpitations, shortness of breath, fevers, GI or GU concerns at today's visit  Past Medical History  Diagnosis Date  . GERD (gastroesophageal reflux disease)   . Depression   . Anxiety   . Insomnia   . IBS (irritable bowel syndrome)   . Diverticulosis   . Barrett esophagus   . Hiatal hernia   . Unspecified menopausal and postmenopausal disorder   . Vitamin B12 deficiency   . Blind loop syndrome   . Hepatic cyst   . Unspecified hypothyroidism   . Diarrhea   . Family history of malignant neoplasm of gastrointestinal tract     multiple members  . Endometrial hyperplasia 02/27/2006    BENIGN ENDO BX ON 02/2007  . C. difficile diarrhea   . Osteoporosis 05/2011    -2.5 Lt femoral neck  . Cystocele, midline   . Rectocele   . Uterine prolapse without mention of vaginal wall prolapse   . Chest pain, atypical 07/14/2011  . Leaky heart valve   . Hiatal hernia   . 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 Thrush 07/21/2011  . Rectal bleeding 10/10/2011  . Sinusitis acute 02/03/2012  . Contact dermatitis 03/23/2012  . Headache 04/15/2012  . 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 Hematuria 04/27/2012    Past Surgical History  Procedure Date  . Appendectomy   . Uterine fibroid surgery   . Hysteroscopy     POLYP    Family  History  Problem Relation Age of Onset  . Colon cancer Mother   . Diabetes Mother   . Cancer Mother     colon  . Hypertension Mother   . Colon polyps Father   . Colon cancer Maternal Aunt   . Colon cancer Maternal Grandmother   . Breast cancer Maternal Grandmother   . Diabetes Paternal Grandmother   . Breast cancer Paternal Grandmother   . Colon cancer Cousin     X3  . Ovarian cancer Cousin     History   Social History  . Marital Status: Married    Spouse Name: N/A    Number of Children: 4  . Years of Education: N/A   Occupational History  . Retired    Social History Main Topics  . Smoking status: Former Smoker    Quit date: 08/19/1995  . Smokeless tobacco: Never Used  . Alcohol Use: No  . Drug Use: No  . Sexually Active: No   Other Topics Concern  . Not on file   Social History Narrative  . No narrative on file    Current Outpatient Prescriptions on File Prior to Visit  Medication Sig Dispense Refill  . albuterol (PROVENTIL HFA;VENTOLIN HFA) 108 (90 BASE) MCG/ACT inhaler Inhale 2 puffs into the lungs every 6 (six) hours as needed.  1 Inhaler  1  . Calcium Carbonate (CALCIUM 600 PO) Take by mouth.        . Cholecalciferol (VITAMIN D) 2000 UNITS CAPS Take by mouth.        . lactobacillus acidophilus & bulgar (LACTINEX) chewable tablet Chew 2 tablets by mouth 3 (three) times daily with meals.  180 tablet  0  . ranitidine (ZANTAC) 300 MG tablet Take 1 tablet (300 mg total) by mouth at bedtime.  90 tablet  3  . [DISCONTINUED] nebivolol (BYSTOLIC) 5 MG tablet Take 1 tablet (5 mg total) by mouth daily.  30 tablet  0  . promethazine (PHENERGAN) 25 MG tablet Take 1 tablet (25 mg total) by mouth every 8 (eight) hours as needed for nausea.  20 tablet  1    Allergies  Allergen Reactions  . Sulfamethoxazole Shortness Of Breath    chest tightness  . Iohexol   . Iodinated Diagnostic Agents     Pt states at Dr.Grapeys office 15 years ago blacked out for 2 hours during  injection for IVP. Was told never to have IV dye again.     Review of Systems  Review of Systems  Constitutional: Negative for fever and malaise/fatigue.  HENT: Positive for congestion.   Eyes: Negative for discharge.  Respiratory: Negative for shortness of breath.   Cardiovascular: Negative for chest pain, palpitations and leg swelling.  Gastrointestinal: Negative for nausea, abdominal pain and diarrhea.  Genitourinary: Negative for dysuria.  Musculoskeletal: Negative for falls.  Skin: Negative for rash.  Neurological: Positive for headaches. Negative for loss of consciousness.  Endo/Heme/Allergies: Negative for polydipsia.  Psychiatric/Behavioral: Negative for depression and suicidal ideas. The patient is not nervous/anxious and does not have insomnia.     Objective  BP 138/82  Pulse 70  Temp 98.4 F (36.9 C) (Temporal)  Ht 5\' 5"  (1.651 m)  Wt 161 lb (73.029 kg)  BMI 26.79 kg/m2  SpO2 99%  Physical Exam  Physical Exam  Constitutional: She is oriented to person, place, and time and well-developed, well-nourished, and in no distress. No distress.  HENT:  Head: Normocephalic and atraumatic.  Eyes: Conjunctivae normal are normal.  Neck: Neck supple. No thyromegaly present.  Cardiovascular: Normal rate, regular rhythm and normal heart sounds.   No murmur heard. Pulmonary/Chest: Effort normal and breath sounds normal. She has no wheezes.  Abdominal: She exhibits no distension and no mass.  Musculoskeletal: She exhibits no edema.  Lymphadenopathy:    She has no cervical adenopathy.  Neurological: She is alert and oriented to person, place, and time.  Skin: Skin is warm and dry. No rash noted. She is not diaphoretic.  Psychiatric: Memory, affect and judgment normal.    Lab Results  Component Value Date   TSH 0.93 06/23/2012   Lab Results  Component Value Date   WBC 8.6 10/03/2011   HGB 14.6 10/03/2011   HCT 42.4 10/03/2011   MCV 89.8 10/03/2011   PLT 274 10/03/2011    Lab Results  Component Value Date   CREATININE 0.67 05/27/2012   BUN 9 05/27/2012   NA 138 05/27/2012   K 3.6 05/27/2012   CL 100 05/27/2012   CO2 29 05/27/2012   Lab Results  Component Value Date   ALT 26 05/27/2012   AST 24 05/27/2012   ALKPHOS 99 05/27/2012   BILITOT 0.4 05/27/2012   Lab Results  Component Value Date   CHOL 218* 06/30/2011   Lab Results  Component Value Date   HDL 55.80 06/30/2011  Lab Results  Component Value Date   LDLCALC 112 11/21/2010   Lab Results  Component Value Date   TRIG 80.0 06/30/2011   Lab Results  Component Value Date   CHOLHDL 4 06/30/2011     Assessment & Plan  HYPERTENSION Improved control on current meds, given a refill on Diltiazem and bystolic. TSH normal today  Gastroesophageal reflux disease with hiatal hernia Ranitidine and probiotics are helpful continue the same  Headache Persistent despite improvement in sinusitis, she has been seen by ENT and has been offered correction of a deviated septum but declines a this time

## 2012-06-23 NOTE — Assessment & Plan Note (Addendum)
Improved control on current meds, given a refill on Diltiazem and bystolic. TSH normal today

## 2012-06-23 NOTE — Assessment & Plan Note (Signed)
Persistent despite improvement in sinusitis, she has been seen by ENT and has been offered correction of a deviated septum but declines a this time

## 2012-06-24 NOTE — Progress Notes (Signed)
Quick Note:  Patient Informed and voiced understanding ______ 

## 2012-06-28 ENCOUNTER — Telehealth: Payer: Self-pay | Admitting: *Deleted

## 2012-06-28 NOTE — Telephone Encounter (Signed)
She can go back on the 180 mg daily but then I need a follow up visit in roughly 2 weeks to assess bp on new meds. Can disp #30 with 1 rf of the 180 mg dose

## 2012-06-28 NOTE — Telephone Encounter (Signed)
Pt was seen on 11/6 and given refill on Cardizem 120.  Pt has also been on Cardizem 180 in the past.  Pt thought 120 dose was appropriate, but noticed this weekend that BP was up and she was having some ringing in her ears.  She found some Cardizem 180 that she had at home and took those and noticed BP coming down.  Pt would like 180mg  dose called to pharmacy. Is this OK without OV or nurse BP check. Please advise.

## 2012-06-29 MED ORDER — DILTIAZEM HCL ER COATED BEADS 180 MG PO CP24: 180.0000 mg | ORAL_CAPSULE | Freq: Every day | ORAL | Status: DC

## 2012-06-29 NOTE — Telephone Encounter (Signed)
RX sent to pharmacy. Pt informed.

## 2012-07-27 ENCOUNTER — Encounter: Payer: Self-pay | Admitting: Family Medicine

## 2012-07-27 ENCOUNTER — Ambulatory Visit (INDEPENDENT_AMBULATORY_CARE_PROVIDER_SITE_OTHER): Payer: Self-pay | Admitting: Family Medicine

## 2012-07-27 ENCOUNTER — Ambulatory Visit: Payer: Self-pay | Admitting: Family Medicine

## 2012-07-27 VITALS — BP 148/78 | HR 98 | Temp 97.4°F | Ht 65.0 in | Wt 163.0 lb

## 2012-07-27 DIAGNOSIS — IMO0001 Reserved for inherently not codable concepts without codable children: Secondary | ICD-10-CM

## 2012-07-27 DIAGNOSIS — I10 Essential (primary) hypertension: Secondary | ICD-10-CM

## 2012-07-27 DIAGNOSIS — R109 Unspecified abdominal pain: Secondary | ICD-10-CM

## 2012-07-27 DIAGNOSIS — M609 Myositis, unspecified: Secondary | ICD-10-CM

## 2012-07-27 DIAGNOSIS — F418 Other specified anxiety disorders: Secondary | ICD-10-CM

## 2012-07-27 DIAGNOSIS — R300 Dysuria: Secondary | ICD-10-CM

## 2012-07-27 DIAGNOSIS — R51 Headache: Secondary | ICD-10-CM

## 2012-07-27 DIAGNOSIS — F341 Dysthymic disorder: Secondary | ICD-10-CM

## 2012-07-27 DIAGNOSIS — A0472 Enterocolitis due to Clostridium difficile, not specified as recurrent: Secondary | ICD-10-CM

## 2012-07-27 LAB — CBC
Hemoglobin: 13.5 g/dL (ref 12.0–15.0)
MCHC: 32.7 g/dL (ref 30.0–36.0)
RDW: 12.9 % (ref 11.5–14.6)
WBC: 6.1 10*3/uL (ref 4.5–10.5)

## 2012-07-27 LAB — POCT URINALYSIS DIPSTICK
Ketones, UA: NEGATIVE
Nitrite, UA: NEGATIVE
Protein, UA: NEGATIVE
Urobilinogen, UA: 0.2
pH, UA: 6.5

## 2012-07-27 MED ORDER — DILTIAZEM HCL ER COATED BEADS 180 MG PO CP24: 180.0000 mg | ORAL_CAPSULE | Freq: Every day | ORAL | Status: DC

## 2012-07-27 MED ORDER — AMITRIPTYLINE HCL 25 MG PO TABS: 25.0000 mg | ORAL_TABLET | Freq: Every day | ORAL | Status: DC

## 2012-07-27 NOTE — Assessment & Plan Note (Signed)
No c/o

## 2012-07-27 NOTE — Assessment & Plan Note (Signed)
No concerns today 

## 2012-07-27 NOTE — Assessment & Plan Note (Signed)
Mild elevation today, avoid sodium and recheck next month

## 2012-07-27 NOTE — Patient Instructions (Addendum)
Switch to Digestive [employer]vantage probiotics to see if that helps, finish 7 days of Flagyl if symptoms worsen after stopping can call for refill Diarrhea Infections caused by germs (bacterial) or a virus commonly cause diarrhea. Your caregiver has determined that with time, rest and fluids, the diarrhea should improve. In general, eat normally while drinking more water than usual. Although water may prevent dehydration, it does not contain salt and minerals (electrolytes). Broths, weak tea without caffeine and oral rehydration solutions (ORS) replace fluids and electrolytes. Small amounts of fluids should be taken frequently. Large amounts at one time may not be tolerated. Plain water may be harmful in infants and the elderly. Oral rehydrating solutions (ORS) are available at pharmacies and grocery stores. ORS replace water and important electrolytes in proper proportions. Sports drinks are not as effective as ORS and may be harmful due to sugars worsening diarrhea.  ORS is especially recommended for use in children with diarrhea. As a general guideline for children, replace any new fluid losses from diarrhea and/or vomiting with ORS as follows:  If your child weighs 22 pounds or under (10 kg or less), give 60-120 mL ( -  cup or 2 - 4 ounces) of ORS for each episode of diarrheal stool or vomiting episode.  If your child weighs more than 22 pounds (more than 10 kgs), give 120-240 mL ( - 1 cup or 4 - 8 ounces) of ORS for each diarrheal stool or episode of vomiting.  While correcting for dehydration, children should eat normally. However, foods high in sugar should be avoided because this may worsen diarrhea. Large amounts of carbonated soft drinks, juice, gelatin desserts and other highly sugared drinks should be avoided.  After correction of dehydration, other liquids that are appealing to the child may be added. Children should drink small amounts of fluids frequently and fluids should be increased as  tolerated. Children should drink enough fluids to keep urine clear or pale yellow.  Adults should eat normally while drinking more fluids than usual. Drink small amounts of fluids frequently and increase as tolerated. Drink enough fluids to keep urine clear or pale yellow. Broths, weak decaffeinated tea, lemon lime soft drinks (allowed to go flat) and ORS replace fluids and electrolytes.  Avoid:  Carbonated drinks.  Juice.  Extremely hot or cold fluids.  Caffeine drinks.  Fatty, greasy foods.  Alcohol.  Tobacco.  Too much intake of anything at one time.  Gelatin desserts.  Probiotics are active cultures of beneficial bacteria. They may lessen the amount and number of diarrheal stools in adults. Probiotics can be found in yogurt with active cultures and in supplements.  Wash hands well to avoid spreading bacteria and virus.  Anti-diarrheal medications are not recommended for infants and children.  Only take over-the-counter or prescription medicines for pain, discomfort or fever as directed by your caregiver. Do not give aspirin to children because it may cause Reye's Syndrome.  For adults, ask your caregiver if you should continue all prescribed and over-the-counter medicines.  If your caregiver has given you a follow-up appointment, it is very important to keep that appointment. Not keeping the appointment could result in a chronic or permanent injury, and disability. If there is any problem keeping the appointment, you must call back to this facility for assistance. SEEK IMMEDIATE MEDICAL CARE IF:   You or your child is unable to keep fluids down or other symptoms or problems become worse in spite of treatment.  Vomiting or diarrhea develops and becomes  persistent.  There is vomiting of blood or bile (green material).  There is blood in the stool or the stools are black and tarry.  There is no urine output in 6-8 hours or there is only a small amount of very dark  urine.  Abdominal pain develops, increases or localizes.  You have a fever.  Your baby is older than 3 months with a rectal temperature of 102 F (38.9 C) or higher.  Your baby is 45 months old or younger with a rectal temperature of 100.4 F (38 C) or higher.  You or your child develops excessive weakness, dizziness, fainting or extreme thirst.  You or your child develops a rash, stiff neck, severe headache or become irritable or sleepy and difficult to awaken. MAKE SURE YOU:   Understand these instructions.  Will watch your condition.  Will get help right away if you are not doing well or get worse. Document Released: 07/25/2002 Document Revised: 10/27/2011 Document Reviewed: 06/11/2009 [facility] Patient Information 2013 [place], [place].

## 2012-07-27 NOTE — Progress Notes (Signed)
Patient ID: Megan [name], female   DOB: 1951/05/07, 61 y.o.   MRN: [id number] Megan [name] [id number] [birth year] 07/27/2012      Progress Note-Follow Up  Subjective  Chief Complaint  Chief Complaint  Patient presents with  . Follow-up    on BP medication    HPI  Patient is a 61 year old Caucasian female who with left-sided headache. Previously we have recommended MRI and referral to neurology but she has declined. At this point he says she's ready to proceed with referral. She has constant low-level pain left-sided and diffuse abdomen pains over the top of her head behind her left eye at times. Has a whooshing sound in her ear as well intermittently. Continues to struggle with anxiety and depression. Stop Zoloft as it made her feel more depressed and nauseous. She had trouble sleeping and is very irritable. Denies suicidal ideation. Has had some increased abdominal cramping and some diarrhea recently. Had some flatulence Mr.several days ago and her symptoms are improved at this time. No dyspepsia or diarrhea. No fevers or chills she is noted but no malaise or myalgias is appreciated. No chest pain palpitations shortness or breath and no other acute complaints are noted  Past Medical History  Diagnosis Date  . GERD (gastroesophageal reflux disease)   . Depression   . Anxiety   . Insomnia   . IBS (irritable bowel syndrome)   . Diverticulosis   . Barrett esophagus   . Hiatal hernia   . Unspecified menopausal and postmenopausal disorder   . Vitamin B12 deficiency   . Blind loop syndrome   . Hepatic cyst   . Unspecified hypothyroidism   . Diarrhea   . Family history of malignant neoplasm of gastrointestinal tract     multiple members  . Endometrial hyperplasia 02/27/2006    BENIGN ENDO BX ON 02/2007  . C. difficile diarrhea   . Osteoporosis 05/2011    -2.5 Lt femoral neck  . Cystocele, midline   . Rectocele   . Uterine prolapse without mention of vaginal wall prolapse    . Chest pain, atypical 07/14/2011  . Leaky heart valve   . Hiatal hernia   . 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 Thrush 07/21/2011  . Rectal bleeding 10/10/2011  . Sinusitis acute 02/03/2012  . Contact dermatitis 03/23/2012  . Headache 04/15/2012  . 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 Hematuria 04/27/2012    Past Surgical History  Procedure Date  . Appendectomy   . Uterine fibroid surgery   . Hysteroscopy     POLYP    Family History  Problem Relation Age of Onset  . Colon cancer Mother   . Diabetes Mother   . Cancer Mother     colon  . Hypertension Mother   . Colon polyps Father   . Colon cancer Maternal Aunt   . Colon cancer Maternal Grandmother   . Breast cancer Maternal Grandmother   . Diabetes Paternal Grandmother   . Breast cancer Paternal Grandmother   . Colon cancer Cousin     X3  . Ovarian cancer Cousin     History   Social History  . Marital Status: Married    Spouse Name: N/A    Number of Children: 4  . Years of Education: N/A   Occupational History  . Retired    Social History Main Topics  . Smoking status: Former Smoker    Quit date: 08/19/1995  . Smokeless tobacco: Never Used  . Alcohol Use: No  .  Drug Use: No  . Sexually Active: No   Other Topics Concern  . Not on file   Social History Narrative  . No narrative on file    Current Outpatient Prescriptions on File Prior to Visit  Medication Sig Dispense Refill  . albuterol (PROVENTIL HFA;VENTOLIN HFA) 108 (90 BASE) MCG/ACT inhaler Inhale 2 puffs into the lungs every 6 (six) hours as needed.  1 Inhaler  1  . Calcium Carbonate (CALCIUM 600 PO) Take by mouth.        . Cholecalciferol (VITAMIN D) 2000 UNITS CAPS Take by mouth.        . diazepam (VALIUM) 10 MG tablet Take 1 tablet (10 mg total) by mouth every 12 (twelve) hours as needed for anxiety or sleep.  60 tablet  2  . HYDROcodone-acetaminophen (NORCO/VICODIN) 5-325 MG per tablet Take 1 tablet by mouth every 6 (six) hours as needed for  pain.  60 tablet  2  . lactobacillus acidophilus & bulgar (LACTINEX) chewable tablet Chew 2 tablets by mouth 3 (three) times daily with meals.  180 tablet  0  . nebivolol (BYSTOLIC) 5 MG tablet Take 1 tablet (5 mg total) by mouth daily. Only fill if patient requests  30 tablet  5  . ranitidine (ZANTAC) 300 MG tablet Take 1 tablet (300 mg total) by mouth at bedtime.  90 tablet  3  . vitamin B-12 (CYANOCOBALAMIN) 100 MCG tablet Take 100 mcg by mouth 2 (two) times daily.      . vitamin C (ASCORBIC ACID) 500 MG tablet Take 500 mg by mouth 2 (two) times daily.      Marland Kitchen amitriptyline (ELAVIL) 25 MG tablet Take 1 tablet (25 mg total) by mouth at bedtime.  30 tablet  1  . promethazine (PHENERGAN) 25 MG tablet Take 1 tablet (25 mg total) by mouth every 8 (eight) hours as needed for nausea.  20 tablet  1    Allergies  Allergen Reactions  . Sulfamethoxazole Shortness Of Breath    chest tightness  . Iohexol   . Iodinated Diagnostic Agents     Pt states at Dr.Grapeys office 15 years ago blacked out for 2 hours during injection for IVP. Was told never to have IV dye again.     Review of Systems  Review of Systems  Constitutional: Positive for malaise/fatigue. Negative for fever.  HENT: Negative for congestion.   Eyes: Negative for discharge.  Respiratory: Negative for shortness of breath.   Cardiovascular: Negative for chest pain, palpitations and leg swelling.  Gastrointestinal: Positive for nausea, abdominal pain and diarrhea. Negative for constipation.  Genitourinary: Negative for dysuria.  Musculoskeletal: Negative for falls.  Skin: Negative for rash.  Neurological: Negative for loss of consciousness and headaches.  Endo/Heme/Allergies: Negative for polydipsia.  Psychiatric/Behavioral: Positive for depression. Negative for suicidal ideas. The patient is nervous/anxious and has insomnia.     Objective  BP 148/78  Pulse 98  Temp 97.4 F (36.3 C) (Temporal)  Ht 5\' 5"  (1.651 m)  Wt 163 lb  (73.936 kg)  BMI 27.12 kg/m2  SpO2 98%  Physical Exam  Physical Exam  Constitutional: She is oriented to person, place, and time and well-developed, well-nourished, and in no distress. No distress.  HENT:  Head: Normocephalic and atraumatic.  Eyes: Conjunctivae normal are normal.  Neck: Neck supple. No thyromegaly present.  Cardiovascular: Normal rate, regular rhythm and normal heart sounds.   No murmur heard. Pulmonary/Chest: Effort normal and breath sounds normal. She has no wheezes.  Abdominal: She exhibits no distension and no mass.  Musculoskeletal: She exhibits no edema.  Lymphadenopathy:    She has no cervical adenopathy.  Neurological: She is alert and oriented to person, place, and time.  Skin: Skin is warm and dry. No rash noted. She is not diaphoretic.  Psychiatric: Memory, affect and judgment normal.    Lab Results  Component Value Date   TSH 0.93 06/23/2012   Lab Results  Component Value Date   WBC 6.1 07/27/2012   HGB 13.5 07/27/2012   HCT 41.3 07/27/2012   MCV 93.9 07/27/2012   PLT 275.0 07/27/2012   Lab Results  Component Value Date   CREATININE 0.67 05/27/2012   BUN 9 05/27/2012   NA 138 05/27/2012   K 3.6 05/27/2012   CL 100 05/27/2012   CO2 29 05/27/2012   Lab Results  Component Value Date   ALT 26 05/27/2012   AST 24 05/27/2012   ALKPHOS 99 05/27/2012   BILITOT 0.4 05/27/2012   Lab Results  Component Value Date   CHOL 218* 06/30/2011   Lab Results  Component Value Date   HDL 55.80 06/30/2011   Lab Results  Component Value Date   LDLCALC 112 11/21/2010   Lab Results  Component Value Date   TRIG 80.0 06/30/2011   Lab Results  Component Value Date   CHOLHDL 4 06/30/2011     Assessment & Plan  ANXIETY DEPRESSION Try Amitriptyline 25 mg qhs, should help with insomnia, anxiety and depression, HA  Headache Comes and goes and is worse lately.   Myositis No c/o.   Abdominal pain No concerns today  C. difficile  colitis Had an episode of increased abdominal pain and diarrhea so she had some Flagyl at home so she took that and her symptoms ar eall down now. Has a few more days worth of Flagyl and will call her infectious disease doctor. Restart a new probiotic  HYPERTENSION Mild elevation today, avoid sodium and recheck next month

## 2012-07-27 NOTE — Assessment & Plan Note (Signed)
Comes and goes and is worse lately.

## 2012-07-27 NOTE — Assessment & Plan Note (Signed)
Had an episode of increased abdominal pain and diarrhea so she had some Flagyl at home so she took that and her symptoms ar eall down now. Has a few more days worth of Flagyl and will call her infectious disease doctor. Restart a new probiotic

## 2012-07-27 NOTE — Assessment & Plan Note (Signed)
Try Amitriptyline 25 mg qhs, should help with insomnia, anxiety and depression, HA

## 2012-08-03 ENCOUNTER — Other Ambulatory Visit: Payer: Self-pay | Admitting: Family Medicine

## 2012-08-03 DIAGNOSIS — I10 Essential (primary) hypertension: Secondary | ICD-10-CM

## 2012-08-03 DIAGNOSIS — R109 Unspecified abdominal pain: Secondary | ICD-10-CM

## 2012-08-03 NOTE — Progress Notes (Signed)
Quick Note:  Patient Informed and voiced understanding ______ 

## 2012-08-04 ENCOUNTER — Telehealth: Payer: Self-pay | Admitting: Family Medicine

## 2012-08-04 ENCOUNTER — Other Ambulatory Visit (INDEPENDENT_AMBULATORY_CARE_PROVIDER_SITE_OTHER): Payer: Self-pay

## 2012-08-04 DIAGNOSIS — I10 Essential (primary) hypertension: Secondary | ICD-10-CM

## 2012-08-04 DIAGNOSIS — R109 Unspecified abdominal pain: Secondary | ICD-10-CM

## 2012-08-04 LAB — RENAL FUNCTION PANEL
BUN: 8 mg/dL (ref 6–23)
CO2: 28 mEq/L (ref 19–32)
Chloride: 105 mEq/L (ref 96–112)
Creatinine, Ser: 0.7 mg/dL (ref 0.4–1.2)
GFR: 94.78 mL/min (ref >60.00)
Phosphorus: 3.2 mg/dL (ref 2.3–4.6)
Sodium: 139 mEq/L (ref 135–145)

## 2012-08-04 NOTE — Telephone Encounter (Signed)
She could try taking a 1/2 tab of the Elavil 25 mg qhs and see if she tolerates that, if not she can stay off it. Let me know if she wants something else for sleep

## 2012-08-04 NOTE — Telephone Encounter (Signed)
Please advise? Pt aware that MD is out of office until tomorrow

## 2012-08-04 NOTE — Telephone Encounter (Signed)
Patient states the Elavil makes her sleepy and nauseous. With the first dose she slept 2 days so she is no longer taking the medication. Please advise.

## 2012-08-05 NOTE — Progress Notes (Signed)
Quick Note:  Patient Informed and voiced understanding ______ 

## 2012-08-05 NOTE — Telephone Encounter (Signed)
Left a message for patient to return my call. 

## 2012-08-05 NOTE — Telephone Encounter (Signed)
Pt informed and voiced understanding

## 2012-08-07 ENCOUNTER — Ambulatory Visit (HOSPITAL_BASED_OUTPATIENT_CLINIC_OR_DEPARTMENT_OTHER)
Admission: RE | Admit: 2012-08-07 | Discharge: 2012-08-07 | Disposition: A | Discharge status: Home / Self Care | Payer: Self-pay | Source: Ambulatory Visit | Attending: Family Medicine | Admitting: Family Medicine

## 2012-08-07 DIAGNOSIS — R42 Dizziness and giddiness: Secondary | ICD-10-CM | POA: Insufficient documentation

## 2012-08-07 DIAGNOSIS — R51 Headache: Secondary | ICD-10-CM | POA: Insufficient documentation

## 2012-08-07 DIAGNOSIS — H9319 Tinnitus, unspecified ear: Secondary | ICD-10-CM | POA: Insufficient documentation

## 2012-08-07 DIAGNOSIS — J3489 Other specified disorders of nose and nasal sinuses: Secondary | ICD-10-CM | POA: Insufficient documentation

## 2012-08-07 MED ORDER — GADOBENATE DIMEGLUMINE 529 MG/ML IV SOLN
15.0000 mL | Freq: Once | INTRAVENOUS | Status: AC | PRN
  Administered 2012-08-07: 15 mL via INTRAVENOUS

## 2012-08-09 ENCOUNTER — Telehealth: Payer: Self-pay

## 2012-08-09 MED ORDER — AZITHROMYCIN 250 MG PO TABS: 250.0000 mg | ORAL_TABLET | Freq: Every day | ORAL | Status: DC

## 2012-08-09 NOTE — Progress Notes (Signed)
Quick Note:  Patient Informed and voiced understanding. Patient states MD has already referred her to [facility] so she will contact them. RX sent ______

## 2012-08-09 NOTE — Telephone Encounter (Signed)
RX sent and pt notified. 

## 2012-08-13 ENCOUNTER — Telehealth: Payer: Self-pay

## 2012-08-13 NOTE — Telephone Encounter (Signed)
[name] with [facility] spoke with patient at 5:34pm on 08-12-12. Pt called regarding headache "sinus headache", pt states she had MRI on Friday and found spots on her brain and sinus infection. She has been referred to neurology and is on her third day of Zpack. She started with headache at 8 this morning. Located behind her eyes above her forehead. Over the course of the morning she took- Hydrocodone, Fioricet, Valium and phenergan. Drank coffee and Coke. The pain eased up but has started again 15:00. She just took Hydrocodone, Valium, and a phenergan again. She states she has never had anything that hurt like this before. She states the pain is horrible, "Worst headache of her life". Her husband is with her. Pain scale 20 out of 10. Emergent s/sx ruled out per headache protocol with the exception of "sudden sever disabling pain "worst headache of my life". Advised to call 911/ED. Understanding expressed

## 2012-08-19 ENCOUNTER — Telehealth: Payer: Self-pay

## 2012-08-19 NOTE — Telephone Encounter (Signed)
Patient left a message stating that finished her Zpak on the 29th and doesn't see the specialist until the 10th of Jan. Pt states she still has all the sinus infection pressure, teeth hurting. Pt is wandering if she should start a new antibiotic? Pt also states she has been taking 2 Hydrocodones at a time so she would like MD to increase her RX? Please advise? Pt is aware that MD is out of the office until tomorrow.

## 2012-08-19 NOTE — Telephone Encounter (Signed)
Will not increase the hydrocodone without a visit, can rx a new antibiotic Levaquin 500 mg daily x 10 days, disp #10, if no allergies

## 2012-08-20 MED ORDER — LEVOFLOXACIN 500 MG PO TABS: 500.0000 mg | ORAL_TABLET | Freq: Every day | ORAL | Status: DC

## 2012-08-20 NOTE — Telephone Encounter (Signed)
RX sent, pt informed, and informed about the hydrocodone. Pt states she doesn't feel like going anywhere so she will just alternate the hydrocodone with tylenol

## 2012-09-07 ENCOUNTER — Other Ambulatory Visit: Payer: Self-pay | Admitting: Cardiovascular Disease

## 2012-09-07 ENCOUNTER — Ambulatory Visit (INDEPENDENT_AMBULATORY_CARE_PROVIDER_SITE_OTHER): Payer: Self-pay | Admitting: Family Medicine

## 2012-09-07 ENCOUNTER — Encounter: Payer: Self-pay | Admitting: Family Medicine

## 2012-09-07 ENCOUNTER — Ambulatory Visit: Payer: Self-pay | Admitting: Family Medicine

## 2012-09-07 VITALS — BP 150/82 | HR 77 | Temp 97.6°F | Ht 65.0 in | Wt 164.1 lb

## 2012-09-07 DIAGNOSIS — F329 Major depressive disorder, single episode, unspecified: Secondary | ICD-10-CM

## 2012-09-07 DIAGNOSIS — I10 Essential (primary) hypertension: Secondary | ICD-10-CM

## 2012-09-07 DIAGNOSIS — R51 Headache: Secondary | ICD-10-CM

## 2012-09-07 DIAGNOSIS — F341 Dysthymic disorder: Secondary | ICD-10-CM

## 2012-09-07 DIAGNOSIS — K589 Irritable bowel syndrome without diarrhea: Secondary | ICD-10-CM

## 2012-09-07 DIAGNOSIS — F32A Depression, unspecified: Secondary | ICD-10-CM

## 2012-09-07 DIAGNOSIS — M810 Age-related osteoporosis without current pathological fracture: Secondary | ICD-10-CM | POA: Insufficient documentation

## 2012-09-07 DIAGNOSIS — G4700 Insomnia, unspecified: Secondary | ICD-10-CM

## 2012-09-07 DIAGNOSIS — M549 Dorsalgia, unspecified: Secondary | ICD-10-CM

## 2012-09-07 MED ORDER — DILTIAZEM HCL ER COATED BEADS 180 MG PO CP24: 180.0000 mg | ORAL_CAPSULE | Freq: Every day | ORAL | Status: DC

## 2012-09-07 MED ORDER — CARVEDILOL 6.25 MG PO TABS: 6.2500 mg | ORAL_TABLET | Freq: Two times a day (BID) | ORAL | Status: DC

## 2012-09-07 MED ORDER — AMITRIPTYLINE HCL 50 MG PO TABS: 50.0000 mg | ORAL_TABLET | Freq: Every day | ORAL | Status: DC

## 2012-09-07 MED ORDER — ESCITALOPRAM OXALATE 10 MG PO TABS: 10.0000 mg | ORAL_TABLET | Freq: Every day | ORAL | Status: DC

## 2012-09-07 MED ORDER — HYDROCODONE-ACETAMINOPHEN 5-325 MG PO TABS: 1.0000 | ORAL_TABLET | Freq: Four times a day (QID) | ORAL | Status: DC | PRN

## 2012-09-07 NOTE — Assessment & Plan Note (Signed)
Continues to struggle with a difficult marriage and chronic health problems. He is greatly and he was often. Will try to 10 mg daily. We'll also increase from 25-50 to see that he help her rest somewhat better at night. Report any concerning side effects and reassess in 2 months time or as needed.

## 2012-09-07 NOTE — Assessment & Plan Note (Signed)
Reports she has been advised to use Reclast in past. Will hold off for now secondary to all of her trouble with her GI tract will consider further treatment in future

## 2012-09-07 NOTE — Progress Notes (Signed)
Patient ID: Megan [name], female   DOB: 01-23-1951, 62 y.o.   MRN: [id number] Megan [name] [id number] [birth year] 09/07/2012      Progress Note-Follow Up  Subjective  Chief Complaint  Chief Complaint  Patient presents with  . Follow-up    6 week    HPI  Patient is a 62 year old Caucasian female who is in today for followup she was seen by neurology and ultimately they were concerned that the majority of her symptoms were related to anxiety and have sent her back to [place]. Headaches are actually improved. She's having less frequently but she does continue to struggle with high levels of stress and anxiety. Acknowledges her marriage is still difficult. She cries frequently struggles with anhedonia and insomnia. Denies any suicidal ideation. Is struggling with increased back pain arthralgias and myalgias. Her GI situation continues to be improved. No diarrhea, abdominal pain, chest pain. She does note at times feeling so anxious that she becomes tremulous and short of breath this is not on a routine basis.  Past Medical History  Diagnosis Date  . GERD (gastroesophageal reflux disease)   . Depression   . Anxiety   . Insomnia   . IBS (irritable bowel syndrome)   . Diverticulosis   . Barrett esophagus   . Hiatal hernia   . Unspecified menopausal and postmenopausal disorder   . Vitamin B12 deficiency   . Blind loop syndrome   . Hepatic cyst   . Unspecified hypothyroidism   . Diarrhea   . Family history of malignant neoplasm of gastrointestinal tract     multiple members  . Endometrial hyperplasia 02/27/2006    BENIGN ENDO BX ON 02/2007  . C. difficile diarrhea   . Osteoporosis 05/2011    -2.5 Lt femoral neck  . Cystocele, midline   . Rectocele   . Uterine prolapse without mention of vaginal wall prolapse   . Chest pain, atypical 07/14/2011  . Leaky heart valve   . Hiatal hernia   . 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 Thrush 07/21/2011  . Rectal bleeding 10/10/2011  .  Sinusitis acute 02/03/2012  . Contact dermatitis 03/23/2012  . Headache 04/15/2012  . 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 Hematuria 04/27/2012  . Osteoporosis 09/07/2012    Past Surgical History  Procedure Date  . Appendectomy   . Uterine fibroid surgery   . Hysteroscopy     POLYP    Family History  Problem Relation Age of Onset  . Colon cancer Mother   . Diabetes Mother   . Cancer Mother     colon  . Hypertension Mother   . Colon polyps Father   . Colon cancer Maternal Aunt   . Colon cancer Maternal Grandmother   . Breast cancer Maternal Grandmother   . Diabetes Paternal Grandmother   . Breast cancer Paternal Grandmother   . Colon cancer Cousin     X3  . Ovarian cancer Cousin     History   Social History  . Marital Status: Married    Spouse Name: N/A    Number of Children: 4  . Years of Education: N/A   Occupational History  . Retired    Social History Main Topics  . Smoking status: Former Smoker    Quit date: 08/19/1995  . Smokeless tobacco: Never Used  . Alcohol Use: No  . Drug Use: No  . Sexually Active: No   Other Topics Concern  . Not on file   Social History Narrative  .  No narrative on file    Current Outpatient Prescriptions on File Prior to Visit  Medication Sig Dispense Refill  . albuterol (PROVENTIL HFA;VENTOLIN HFA) 108 (90 BASE) MCG/ACT inhaler Inhale 2 puffs into the lungs every 6 (six) hours as needed.  1 Inhaler  1  . Calcium Carbonate (CALCIUM 600 PO) Take 1,200 mg by mouth. Plus 1000 IU D3      . Cholecalciferol (VITAMIN D) 2000 UNITS CAPS Take by mouth.        . diazepam (VALIUM) 10 MG tablet Take 1 tablet (10 mg total) by mouth every 12 (twelve) hours as needed for anxiety or sleep.  60 tablet  2  . lactobacillus acidophilus & bulgar (LACTINEX) chewable tablet Chew 2 tablets by mouth 3 (three) times daily with meals.  180 tablet  0  . ranitidine (ZANTAC) 300 MG tablet Take 1 tablet (300 mg total) by mouth at bedtime.  90 tablet  3    . vitamin B-12 (CYANOCOBALAMIN) 100 MCG tablet Take 1,000 mcg by mouth daily.       . vitamin C (ASCORBIC ACID) 500 MG tablet Take 500 mg by mouth 2 (two) times daily.      . carvedilol (COREG) 6.25 MG tablet Take 1 tablet (6.25 mg total) by mouth 2 (two) times daily with a meal.  60 tablet  3  . escitalopram (LEXAPRO) 10 MG tablet Take 1 tablet (10 mg total) by mouth daily.  30 tablet  2  . promethazine (PHENERGAN) 25 MG tablet Take 1 tablet (25 mg total) by mouth every 8 (eight) hours as needed for nausea.  20 tablet  1    Allergies  Allergen Reactions  . Sulfamethoxazole Shortness Of Breath    chest tightness  . Iohexol   . Iodinated Diagnostic Agents     Pt states at Dr.Grapeys office 15 years ago blacked out for 2 hours during injection for IVP. Was told never to have IV dye again.     Review of Systems  Review of Systems  Constitutional: Negative for fever and malaise/fatigue.  HENT: Negative for congestion.        Improved.  Eyes: Negative for discharge.  Respiratory: Negative for shortness of breath.   Cardiovascular: Negative for chest pain, palpitations and leg swelling.  Gastrointestinal: Negative for nausea, abdominal pain and diarrhea.  Genitourinary: Negative for dysuria.  Musculoskeletal: Positive for myalgias and back pain. Negative for falls.  Skin: Negative for rash.  Neurological: Positive for tremors and headaches. Negative for loss of consciousness.  Endo/Heme/Allergies: Negative for polydipsia.  Psychiatric/Behavioral: Positive for depression. Negative for suicidal ideas. The patient is nervous/anxious and has insomnia.     Objective  BP 150/82  Pulse 77  Temp 97.6 F (36.4 C) (Oral)  Ht 5\' 5"  (1.651 m)  Wt 164 lb 1.3 oz (74.426 kg)  BMI 27.30 kg/m2  SpO2 97%  Physical Exam  Physical Exam  Constitutional: She is oriented to person, place, and time and well-developed, well-nourished, and in no distress. No distress.  HENT:  Head: Normocephalic  and atraumatic.  Eyes: Conjunctivae normal are normal.  Neck: Neck supple. No thyromegaly present.  Cardiovascular: Normal rate, regular rhythm and normal heart sounds.   Pulmonary/Chest: Effort normal and breath sounds normal. She has no wheezes.  Abdominal: Soft. Bowel sounds are normal. She exhibits no distension and no mass.  Musculoskeletal: She exhibits no edema.  Lymphadenopathy:    She has no cervical adenopathy.  Neurological: She is alert and oriented to  person, place, and time.  Skin: Skin is warm and dry. No rash noted. She is not diaphoretic.  Psychiatric: Memory, affect and judgment normal.    Lab Results  Component Value Date   TSH 0.93 06/23/2012   Lab Results  Component Value Date   WBC 6.1 07/27/2012   HGB 13.5 07/27/2012   HCT 41.3 07/27/2012   MCV 93.9 07/27/2012   PLT 275.0 07/27/2012   Lab Results  Component Value Date   CREATININE 0.7 08/04/2012   BUN 8 08/04/2012   NA 139 08/04/2012   K 4.0 08/04/2012   CL 105 08/04/2012   CO2 28 08/04/2012   Lab Results  Component Value Date   ALT 26 05/27/2012   AST 24 05/27/2012   ALKPHOS 99 05/27/2012   BILITOT 0.4 05/27/2012   Lab Results  Component Value Date   CHOL 218* 06/30/2011   Lab Results  Component Value Date   HDL 55.80 06/30/2011   Lab Results  Component Value Date   LDLCALC 112 11/21/2010   Lab Results  Component Value Date   TRIG 80.0 06/30/2011   Lab Results  Component Value Date   CHOLHDL 4 06/30/2011     Assessment & Plan  ANXIETY DEPRESSION Continues to struggle with a difficult marriage and chronic health problems. He is greatly and he was often. Will try to 10 mg daily. We'll also increase from 25-50 to see that he help her rest somewhat better at night. Report any concerning side effects and reassess in 2 months time or as needed.  Osteoporosis Reports she has been advised to use Reclast in past. Will hold off for now secondary to all of her trouble with her GI tract  will consider further treatment in future  IBS (irritable bowel syndrome) Symptoms greatly improved. No changes  HYPERTENSION Still not fully controlled, will maintain Cardizem CD at 180 and switch Bystolic to Carvedilol 6.25 mg bid and recheck at next visit  INSOMNIA Will try increasing Elavil to 50 mg po qhs, call with any concerns

## 2012-09-07 NOTE — Assessment & Plan Note (Signed)
Will try increasing Elavil to 50 mg po qhs, call with any concerns

## 2012-09-07 NOTE — Assessment & Plan Note (Signed)
Still not fully controlled, will maintain Cardizem CD at 180 and switch Bystolic to Carvedilol 6.25 mg bid and recheck at next visit

## 2012-09-07 NOTE — Patient Instructions (Addendum)
Anxiety and Panic Attacks  Anxiety is your body's way of reacting to real danger or something you think is a danger. It may be fear or worry over a situation like losing your job. Sometimes the cause is not known. A panic attack is made up of physical signs like sweating, shaking, or chest pain. Anxiety and panic attacks may start suddenly. They may be strong. They may come at any time of day, even while sleeping. They may come at any time of life. Panic attacks are scary, but they do not harm you physically.    HOME CARE   Avoid any known causes of your anxiety.   Try to relax. Yoga may help. Tell yourself everything will be okay.   Exercise often.   Get expert advice and help (therapy) to stop anxiety or attacks from happening.   Avoid caffeine, alcohol, and drugs.   Only take medicine as told by your doctor.  GET HELP RIGHT AWAY IF:   Your attacks seem different than normal attacks.   Your problems are getting worse or concern you.  MAKE SURE YOU:   Understand these instructions.   Will watch your condition.   Will get help right away if you are not doing well or get worse.  Document Released: 09/06/2010 Document Revised: 10/27/2011 Document Reviewed: 09/06/2010  [employer] Patient Information 2013 [employer]

## 2012-09-07 NOTE — Assessment & Plan Note (Signed)
Symptoms greatly improved. No changes

## 2012-09-16 ENCOUNTER — Telehealth: Payer: Self-pay | Admitting: Family Medicine

## 2012-09-16 MED ORDER — AZITHROMYCIN 250 MG PO TABS: ORAL_TABLET | ORAL | Status: DC

## 2012-09-16 NOTE — Telephone Encounter (Signed)
Patient Information:  Caller Name: Lynden Ang  Phone: [phone]  Patient: Megan [name], Megan [name]  Gender: Female  DOB: [birth year]  Age: 62 Years  PCP: Danise Edge [facility]  Office Follow Up:  Does the office need to follow up with this patient?: Yes  Instructions For The Office: Patient states Physician is aware she has chronic sinus infections. Asking for Zpack. Declined appt. She is caring for her grandchildren today  RN Note:  Patient declines appt. caring for her Grandchildren. Requesting medication .  pharmacy - [facility] [address]. [phone].  PLEASE CONTACT PATIENT.  Symptoms  Reason For Call & Symptoms: Onset of sinus infection for several days. She is requesting an antibiotic Zpack for sinus issues.  +headache across forehead and eyes.  Left earpain.   LOV 09/07/12.  Reviewed Health History In EMR: Yes  Reviewed Medications In EMR: Yes  Reviewed Allergies In EMR: Yes  Reviewed Surgeries / Procedures: No  Date of Onset of Symptoms: 09/13/2012  Treatments Tried: Saliene solution, nasal spray  Treatments Tried Worked: No  Guideline(s) Used:  Sinus Pain and Congestion  Disposition Per Guideline:   See Today in Office  Reason For Disposition Reached:   Earache  Advice Given:  For a Runny Nose With Profuse Discharge:  Nasal mucus and discharge helps to wash viruses and bacteria out of the nose and sinuses.  Blowing the nose is all that is needed.  If the skin around your nostrils gets irritated, apply a tiny amount of petroleum ointment to the nasal openings once or twice a day.  For a Stuffy Nose - Use Nasal Washes:  Introduction: Saline (salt water) nasal irrigation (nasal wash) is an effective and simple home remedy for treating stuffy nose and sinus congestion. The nose can be irrigated by pouring, spraying, or squirting salt water into the nose and then letting it run back out.  How it Helps: The salt water rinses out excess mucus, washes out any  irritants (dust, allergens) that might be present, and moistens the nasal cavity.  Methods: There are several ways to perform nasal irrigation. You can use a saline nasal spray bottle (available over-the-counter), a rubber ear syringe, a medical syringe without the needle, or a Neti Pot.  Pain and Fever Medicines:  Acetaminophen (e.g., Tylenol):  Regular Strength Tylenol: Take 650 mg (two 325 mg pills) by mouth every 4-6 hours as needed. Each Regular Strength Tylenol pill has 325 mg of acetaminophen.  Ibuprofen (e.g., Motrin, Advil):  Take 400 mg (two 200 mg pills) by mouth every 6 hours.  Hydration:  Drink plenty of liquids (6-8 glasses of water daily). If the air in your home is dry, use a cool mist humidifier  Call Back If:   Severe pain lasts longer than 2 hours after pain medicine  Sinus pain lasts longer than 1 day after starting treatment using nasal washes  Sinus congestion (fullness) lasts longer than 10 days  Fever lasts longer than 3 days  You become worse.  Patient Refused Recommendation:  Patient Requests Prescription  request Zpack RX

## 2012-09-16 NOTE — Telephone Encounter (Signed)
Please advise 

## 2012-09-16 NOTE — Telephone Encounter (Signed)
Pt informed and voiced understanding. [facility]

## 2012-09-16 NOTE — Telephone Encounter (Signed)
OK to send in Zpak once but needs to come in if no improvement. Mucinex 600 mg bid x 10 days

## 2012-11-05 ENCOUNTER — Telehealth: Payer: Self-pay | Admitting: Family Medicine

## 2012-11-05 MED ORDER — AZITHROMYCIN 250 MG PO TABS: ORAL_TABLET | ORAL | Status: DC

## 2012-11-05 NOTE — Telephone Encounter (Signed)
Patient's son called patient has been exposed to whooping cough (daughter n law)  Does not live in house with daughter n law  Please advise

## 2012-11-05 NOTE — Telephone Encounter (Signed)
She can take zpak as prophylaxis against pertussis (whooping cough).  She should return for Tdap at her convenience. Call if she develops cough or fever.

## 2012-11-05 NOTE — Telephone Encounter (Signed)
Left detailed message on home# and to call Monday if questions and to schedule nurse visit for tdap if she hasn't had one in the last 10 years.

## 2012-11-08 NOTE — Telephone Encounter (Signed)
Notified pt and she states she started medication this weekend. Has appt on Thursday and will get tdap at that time.

## 2012-11-11 ENCOUNTER — Encounter: Payer: Self-pay | Admitting: Family Medicine

## 2012-11-11 ENCOUNTER — Ambulatory Visit (INDEPENDENT_AMBULATORY_CARE_PROVIDER_SITE_OTHER): Payer: Self-pay | Admitting: Family Medicine

## 2012-11-11 VITALS — BP 146/82 | HR 76 | Temp 98.0°F | Ht 66.0 in | Wt 163.0 lb

## 2012-11-11 DIAGNOSIS — F329 Major depressive disorder, single episode, unspecified: Secondary | ICD-10-CM

## 2012-11-11 DIAGNOSIS — J329 Chronic sinusitis, unspecified: Secondary | ICD-10-CM

## 2012-11-11 DIAGNOSIS — G4700 Insomnia, unspecified: Secondary | ICD-10-CM

## 2012-11-11 DIAGNOSIS — R002 Palpitations: Secondary | ICD-10-CM

## 2012-11-11 DIAGNOSIS — R51 Headache: Secondary | ICD-10-CM

## 2012-11-11 DIAGNOSIS — J069 Acute upper respiratory infection, unspecified: Secondary | ICD-10-CM

## 2012-11-11 DIAGNOSIS — I10 Essential (primary) hypertension: Secondary | ICD-10-CM

## 2012-11-11 DIAGNOSIS — F341 Dysthymic disorder: Secondary | ICD-10-CM

## 2012-11-11 MED ORDER — HYDROCHLOROTHIAZIDE 25 MG PO TABS: 25.0000 mg | ORAL_TABLET | Freq: Every day | ORAL | Status: DC

## 2012-11-11 MED ORDER — AZITHROMYCIN 250 MG PO TABS: ORAL_TABLET | ORAL | Status: DC

## 2012-11-11 MED ORDER — DIAZEPAM 10 MG PO TABS: 10.0000 mg | ORAL_TABLET | Freq: Two times a day (BID) | ORAL | Status: DC | PRN

## 2012-11-11 MED ORDER — HYDROCODONE-ACETAMINOPHEN 5-325 MG PO TABS: 1.0000 | ORAL_TABLET | Freq: Four times a day (QID) | ORAL | Status: DC | PRN

## 2012-11-11 MED ORDER — ESCITALOPRAM OXALATE 20 MG PO TABS: 20.0000 mg | ORAL_TABLET | Freq: Every day | ORAL | Status: DC

## 2012-11-11 NOTE — Assessment & Plan Note (Signed)
Has stopped responding as well to Lexapro will try increasing to 20 mg daily

## 2012-11-11 NOTE — Progress Notes (Signed)
Patient ID: Megan [name], female   DOB: April 19, 1951, 62 y.o.   MRN: [id number] Megan [name] [id number] 1951/04/06 11/11/2012      Progress Note-Follow Up  Subjective  Chief Complaint  Chief Complaint  Patient presents with  . Follow-up    2 month    HPI  Patient is a 62 year old Caucasian female who is in today in followup. She notes she stopped the Elavil as it made her have insomnia since her sleep. She felt anxious and hyper. She continues to struggle with anxiety and depression but denies suicidal ideation. She continues to be under great deal of stress given her current family situation. She also notes some congestion. She does note Valium does help her sleep somewhat. She's been struggling with congestion and cough. Was recently treated with azithromycin due to some pertussis exposures. Does not have significant cough but continues to have nasal congestion and malaise. No fevers or chills. No chest pain or palpitations. No shortness or breath or GI complaints.   Past Medical History  Diagnosis Date  . GERD (gastroesophageal reflux disease)   . Depression   . Anxiety   . Insomnia   . IBS (irritable bowel syndrome)   . Diverticulosis   . Barrett esophagus   . Hiatal hernia   . Unspecified menopausal and postmenopausal disorder   . Vitamin B12 deficiency   . Blind loop syndrome   . Hepatic cyst   . Unspecified hypothyroidism   . Diarrhea   . Family history of malignant neoplasm of gastrointestinal tract     multiple members  . Endometrial hyperplasia 02/27/2006    BENIGN ENDO BX ON 02/2007  . C. difficile diarrhea   . Osteoporosis 05/2011    -2.5 Lt femoral neck  . Cystocele, midline   . Rectocele   . Uterine prolapse without mention of vaginal wall prolapse   . Chest pain, atypical 07/14/2011  . Leaky heart valve   . Hiatal hernia   . 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 Thrush 07/21/2011  . Rectal bleeding 10/10/2011  . Sinusitis acute 02/03/2012  .  Contact dermatitis 03/23/2012  . Headache 04/15/2012  . 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 Hematuria 04/27/2012  . Osteoporosis 09/07/2012    Past Surgical History  Procedure Laterality Date  . Appendectomy    . Uterine fibroid surgery    . Hysteroscopy      POLYP    Family History  Problem Relation Age of Onset  . Colon cancer Mother   . Diabetes Mother   . Cancer Mother     colon  . Hypertension Mother   . Colon polyps Father   . Colon cancer Maternal Aunt   . Colon cancer Maternal Grandmother   . Breast cancer Maternal Grandmother   . Diabetes Paternal Grandmother   . Breast cancer Paternal Grandmother   . Colon cancer Cousin     X3  . Ovarian cancer Cousin     History   Social History  . Marital Status: Married    Spouse Name: N/A    Number of Children: 4  . Years of Education: N/A   Occupational History  . Retired    Social History Main Topics  . Smoking status: Former Smoker    Quit date: 08/19/1995  . Smokeless tobacco: Never Used  . Alcohol Use: No  . Drug Use: No  . Sexually Active: No   Other Topics Concern  . Not on file   Social History Narrative  .  No narrative on file    Current Outpatient Prescriptions on File Prior to Visit  Medication Sig Dispense Refill  . albuterol (PROVENTIL HFA;VENTOLIN HFA) 108 (90 BASE) MCG/ACT inhaler Inhale 2 puffs into the lungs every 6 (six) hours as needed.  1 Inhaler  1  . Calcium Carbonate (CALCIUM 600 PO) Take 1,200 mg by mouth. Plus 1000 IU D3      . carvedilol (COREG) 6.25 MG tablet Take 1 tablet (6.25 mg total) by mouth 2 (two) times daily with a meal.  60 tablet  3  . Cholecalciferol (VITAMIN D) 2000 UNITS CAPS Take by mouth.        . diltiazem (CARDIZEM CD) 180 MG 24 hr capsule Take 1 capsule (180 mg total) by mouth daily.  30 capsule  5  . Flaxseed, Linseed, (FLAXSEED OIL) 1200 MG CAPS Take 1 capsule by mouth daily.      . Magnesium 250 MG TABS Take 1 tablet by mouth 2 (two) times daily.      .  ranitidine (ZANTAC) 300 MG tablet Take 1 tablet (300 mg total) by mouth at bedtime.  90 tablet  3  . vitamin B-12 (CYANOCOBALAMIN) 100 MCG tablet Take 1,000 mcg by mouth daily.       . vitamin C (ASCORBIC ACID) 500 MG tablet Take 500 mg by mouth 2 (two) times daily.      . vitamin E 400 UNIT capsule Take 400 Units by mouth daily.      . Vitamins A & D (VITAMIN A & D) 5000-400 UNITS CAPS Take 1 capsule by mouth daily.      Marland Kitchen zinc gluconate 50 MG tablet Take 50 mg by mouth daily.      . promethazine (PHENERGAN) 25 MG tablet Take 1 tablet (25 mg total) by mouth every 8 (eight) hours as needed for nausea.  20 tablet  1   No current facility-administered medications on file prior to visit.    Allergies  Allergen Reactions  . Sulfamethoxazole Shortness Of Breath    chest tightness  . Iohexol   . Iodinated Diagnostic Agents     Pt states at Dr.Grapeys office 15 years ago blacked out for 2 hours during injection for IVP. Was told never to have IV dye again.     Review of Systems  Review of Systems  Constitutional: Negative for fever and malaise/fatigue.  HENT: Positive for congestion and sore throat.   Eyes: Negative for discharge.  Respiratory: Negative for shortness of breath.   Cardiovascular: Negative for chest pain, palpitations and leg swelling.  Gastrointestinal: Negative for nausea, abdominal pain and diarrhea.  Genitourinary: Negative for dysuria.  Musculoskeletal: Negative for falls.  Skin: Negative for rash.  Neurological: Negative for loss of consciousness and headaches.  Endo/Heme/Allergies: Negative for polydipsia.  Psychiatric/Behavioral: Positive for depression. Negative for suicidal ideas. The patient is nervous/anxious. The patient does not have insomnia.     Objective  BP 146/82  Pulse 76  Temp(Src) 98 F (36.7 C) (Oral)  Ht 5\' 6"  (1.676 m)  Wt 163 lb 0.6 oz (73.954 kg)  BMI 26.33 kg/m2  SpO2 96%  Physical Exam  Physical Exam  Constitutional: She is  oriented to person, place, and time and well-developed, well-nourished, and in no distress. No distress.  HENT:  Head: Normocephalic and atraumatic.  Eyes: Conjunctivae are normal.  Neck: Neck supple. No thyromegaly present.  Cardiovascular: Normal rate, regular rhythm and normal heart sounds.   No murmur heard. Pulmonary/Chest: Effort normal and  breath sounds normal. She has no wheezes.  Abdominal: She exhibits no distension and no mass.  Musculoskeletal: She exhibits no edema.  Lymphadenopathy:    She has no cervical adenopathy.  Neurological: She is alert and oriented to person, place, and time.  Skin: Skin is warm and dry. No rash noted. She is not diaphoretic.  Psychiatric: Memory, affect and judgment normal.    Lab Results  Component Value Date   TSH 0.93 06/23/2012   Lab Results  Component Value Date   WBC 6.1 07/27/2012   HGB 13.5 07/27/2012   HCT 41.3 07/27/2012   MCV 93.9 07/27/2012   PLT 275.0 07/27/2012   Lab Results  Component Value Date   CREATININE 0.7 08/04/2012   BUN 8 08/04/2012   NA 139 08/04/2012   K 4.0 08/04/2012   CL 105 08/04/2012   CO2 28 08/04/2012   Lab Results  Component Value Date   ALT 26 05/27/2012   AST 24 05/27/2012   ALKPHOS 99 05/27/2012   BILITOT 0.4 05/27/2012   Lab Results  Component Value Date   CHOL 218* 06/30/2011   Lab Results  Component Value Date   HDL 55.80 06/30/2011   Lab Results  Component Value Date   LDLCALC 112 11/21/2010   Lab Results  Component Value Date   TRIG 80.0 06/30/2011   Lab Results  Component Value Date   CHOLHDL 4 06/30/2011     Assessment & Plan  HYPERTENSION Mild elevation and some edema, will try adding HCTZ  URI, acute C/o significant head congestion and PND, given refill on Azithromycin to use prn.  ANXIETY DEPRESSION Has stopped responding as well to Lexapro will try increasing to 20 mg daily  INSOMNIA Elavil was actually waking her so she stopped it. Valium is helpful for  sleep so will continue meds

## 2012-11-11 NOTE — Assessment & Plan Note (Signed)
Elavil was actually waking her so she stopped it. Valium is helpful for sleep so will continue meds

## 2012-11-11 NOTE — Assessment & Plan Note (Signed)
C/o significant head congestion and PND, given refill on Azithromycin to use prn.

## 2012-11-11 NOTE — Patient Instructions (Addendum)
Saline nasal flush, Mucinex twice a day Probiotics  Sinusitis Sinusitis is redness, soreness, and swelling (inflammation) of the paranasal sinuses. Paranasal sinuses are air pockets within the bones of your face (beneath the eyes, the middle of the forehead, or above the eyes). In healthy paranasal sinuses, mucus is able to drain out, and air is able to circulate through them by way of your nose. However, when your paranasal sinuses are inflamed, mucus and air can become trapped. This can allow bacteria and other germs to grow and cause infection. Sinusitis can develop quickly and last only a short time (acute) or continue over a long period (chronic). Sinusitis that lasts for more than 12 weeks is considered chronic.  CAUSES  Causes of sinusitis include:  Allergies.  Structural abnormalities, such as displacement of the cartilage that separates your nostrils (deviated septum), which can decrease the air flow through your nose and sinuses and affect sinus drainage.  Functional abnormalities, such as when the small hairs (cilia) that line your sinuses and help remove mucus do not work properly or are not present. SYMPTOMS  Symptoms of acute and chronic sinusitis are the same. The primary symptoms are pain and pressure around the affected sinuses. Other symptoms include:  Upper toothache.  Earache.  Headache.  Bad breath.  Decreased sense of smell and taste.  A cough, which worsens when you are lying flat.  Fatigue.  Fever.  Thick drainage from your nose, which often is green and may contain pus (purulent).  Swelling and warmth over the affected sinuses. DIAGNOSIS  Your caregiver will perform a physical exam. During the exam, your caregiver may:  Look in your nose for signs of abnormal growths in your nostrils (nasal polyps).  Tap over the affected sinus to check for signs of infection.  View the inside of your sinuses (endoscopy) with a special imaging device with a light  attached (endoscope), which is inserted into your sinuses. If your caregiver suspects that you have chronic sinusitis, one or more of the following tests may be recommended:  Allergy tests.  Nasal culture A sample of mucus is taken from your nose and sent to a lab and screened for bacteria.  Nasal cytology A sample of mucus is taken from your nose and examined by your caregiver to determine if your sinusitis is related to an allergy. TREATMENT  Most cases of acute sinusitis are related to a viral infection and will resolve on their own within 10 days. Sometimes medicines are prescribed to help relieve symptoms (pain medicine, decongestants, nasal steroid sprays, or saline sprays).  However, for sinusitis related to a bacterial infection, your caregiver will prescribe antibiotic medicines. These are medicines that will help kill the bacteria causing the infection.  Rarely, sinusitis is caused by a fungal infection. In theses cases, your caregiver will prescribe antifungal medicine. For some cases of chronic sinusitis, surgery is needed. Generally, these are cases in which sinusitis recurs more than 3 times per year, despite other treatments. HOME CARE INSTRUCTIONS   Drink plenty of water. Water helps thin the mucus so your sinuses can drain more easily.  Use a humidifier.  Inhale steam 3 to 4 times a day (for example, sit in the bathroom with the shower running).  Apply a warm, moist washcloth to your face 3 to 4 times a day, or as directed by your caregiver.  Use saline nasal sprays to help moisten and clean your sinuses.  Take over-the-counter or prescription medicines for pain, discomfort, or fever  only as directed by your caregiver. SEEK IMMEDIATE MEDICAL CARE IF:  You have increasing pain or severe headaches.  You have nausea, vomiting, or drowsiness.  You have swelling around your face.  You have vision problems.  You have a stiff neck.  You have difficulty breathing. MAKE  SURE YOU:   Understand these instructions.  Will watch your condition.  Will get help right away if you are not doing well or get worse. Document Released: 08/04/2005 Document Revised: 10/27/2011 Document Reviewed: 08/19/2011 [facility] Patient Information 2013 [place], [place].

## 2012-11-11 NOTE — Assessment & Plan Note (Addendum)
Mild elevation and some edema, will try adding HCTZ

## 2012-11-19 ENCOUNTER — Ambulatory Visit (INDEPENDENT_AMBULATORY_CARE_PROVIDER_SITE_OTHER): Payer: BC Managed Care – PPO | Admitting: Gynecology

## 2012-11-19 ENCOUNTER — Encounter: Payer: Self-pay | Admitting: Gynecology

## 2012-11-19 DIAGNOSIS — R1032 Left lower quadrant pain: Secondary | ICD-10-CM

## 2012-11-19 DIAGNOSIS — N390 Urinary tract infection, site not specified: Secondary | ICD-10-CM

## 2012-11-19 LAB — CBC WITH DIFFERENTIAL/PLATELET
Eosinophils Relative: 2 % (ref 0–5)
Lymphocytes Relative: 22 % (ref 12–46)
Lymphs Abs: 2.4 10*3/uL (ref 0.7–4.0)
MCV: 90.2 fL (ref 78.0–100.0)
Platelets: 310 10*3/uL (ref 150–400)
RBC: 4.58 MIL/uL (ref 3.87–5.11)
WBC: 10.7 10*3/uL — ABNORMAL HIGH (ref 4.0–10.5)

## 2012-11-19 LAB — COMPREHENSIVE METABOLIC PANEL
ALT: 17 U/L (ref 0–35)
Albumin: 4.4 g/dL (ref 3.5–5.2)
CO2: 29 mEq/L (ref 19–32)
Calcium: 9.9 mg/dL (ref 8.4–10.5)
Chloride: 100 mEq/L (ref 96–112)
Creat: 0.68 mg/dL (ref 0.50–1.10)
Potassium: 3.8 mEq/L (ref 3.5–5.3)
Sodium: 137 mEq/L (ref 135–145)
Total Protein: 7.2 g/dL (ref 6.0–8.3)

## 2012-11-19 LAB — URINALYSIS W MICROSCOPIC + REFLEX CULTURE
Glucose, UA: NEGATIVE mg/dL
Nitrite: NEGATIVE
Protein, ur: NEGATIVE mg/dL

## 2012-11-19 MED ORDER — CIPROFLOXACIN HCL 250 MG PO TABS: 250.0000 mg | ORAL_TABLET | Freq: Two times a day (BID) | ORAL | Status: DC

## 2012-11-19 NOTE — Progress Notes (Signed)
Patient presents with a two-day history of left lower quadrant pain. Patient is a relatively acute onset of stabbing aching pain starting in the nighttime lasted all day yesterday and she took an oxycodone that she had at home to help with that. She also notes that her urination stream seems to have decreased. No frank blood noted. She's not having any dysuria or frequency. No nausea vomiting fever or chills. Last bowel movement was 2 days ago. Does have a history of GI issues to include C. difficile, hiatal hernia and diverticulosis. She also has a history of recurrent lower abdominal/pelvic pain that comes and goes. She was actually recommended for ultrasound in October when I saw her but she never followed up for this.  Exam with Selena Batten assistant Spine straight no CVA tenderness. Abdomen soft with mild left lower quadrant discomfort. No rebound guarding masses organomegaly. Pelvic external BUS vagina with mild atrophic changes. Slight cystocele/uterine prolapse. Cervix grossly normal with atrophic changes. Uterus normal size midline mobile nontender. Adnexa without gross masses. Left abdomen tender to deep palpation with abdominal hand.  Assessment and plan: Left lower quadrant pain. Urinalysis does show some calcium oxalate, few bacteria, 3-6 WBC. Will cover as UTI with ciprofloxacin 250 mg twice a day x7 days. Recommend baseline CBC and comprehensive metabolic panel. I reviewed with her with her history of diverticulosis she may be having a bout of diverticulitis or renal lithiasis although no evidence of hematuria microscopically. Also recommended baseline ultrasound to rule out ovarian process and she agrees to schedule this also. Assuming ultrasound normal in pain resolves we will follow. If her pain persists then I may refer to urology.

## 2012-11-19 NOTE — Patient Instructions (Signed)
Take antibiotics twice daily Follow up for ultrasound as scheduled

## 2012-11-22 LAB — URINE CULTURE

## 2012-11-29 ENCOUNTER — Ambulatory Visit (INDEPENDENT_AMBULATORY_CARE_PROVIDER_SITE_OTHER): Payer: BC Managed Care – PPO

## 2012-11-29 ENCOUNTER — Encounter: Payer: Self-pay | Admitting: Gynecology

## 2012-11-29 ENCOUNTER — Ambulatory Visit (INDEPENDENT_AMBULATORY_CARE_PROVIDER_SITE_OTHER): Payer: BC Managed Care – PPO | Admitting: Gynecology

## 2012-11-29 DIAGNOSIS — N854 Malposition of uterus: Secondary | ICD-10-CM

## 2012-11-29 DIAGNOSIS — N83339 Acquired atrophy of ovary and fallopian tube, unspecified side: Secondary | ICD-10-CM

## 2012-11-29 DIAGNOSIS — R1032 Left lower quadrant pain: Secondary | ICD-10-CM

## 2012-11-29 NOTE — Progress Notes (Signed)
Patient presents for ultrasound having been recently evaluated for left lower quadrant pain. Was treated for UTI and notes that her symptoms have all resolved.  Ultrasound shows uterus to be normal size and echotexture. endometrial echo 1.9 mm. Right and left ovaries atrophic. Cul-de-sac negative.  Assessment and plan: Left lower quadrant pain now resolved. Ultrasound normal. Monitor at present. If recurrent pain and/or any other symptoms then will represent.

## 2012-11-29 NOTE — Patient Instructions (Signed)
Follow up as needed otherwise in the fall 2014 when you're due for your annual exam.

## 2012-12-17 ENCOUNTER — Ambulatory Visit: Payer: Self-pay | Admitting: Family Medicine

## 2012-12-30 ENCOUNTER — Ambulatory Visit (INDEPENDENT_AMBULATORY_CARE_PROVIDER_SITE_OTHER): Payer: BC Managed Care – PPO | Admitting: Family Medicine

## 2012-12-30 ENCOUNTER — Encounter: Payer: Self-pay | Admitting: Family Medicine

## 2012-12-30 VITALS — BP 132/82 | HR 81 | Temp 97.8°F | Ht 66.0 in | Wt 164.1 lb

## 2012-12-30 DIAGNOSIS — T7840XA Allergy, unspecified, initial encounter: Secondary | ICD-10-CM

## 2012-12-30 DIAGNOSIS — Z5189 Encounter for other specified aftercare: Secondary | ICD-10-CM

## 2012-12-30 DIAGNOSIS — K2270 Barrett's esophagus without dysplasia: Secondary | ICD-10-CM

## 2012-12-30 DIAGNOSIS — K219 Gastro-esophageal reflux disease without esophagitis: Secondary | ICD-10-CM

## 2012-12-30 DIAGNOSIS — H6690 Otitis media, unspecified, unspecified ear: Secondary | ICD-10-CM

## 2012-12-30 DIAGNOSIS — I10 Essential (primary) hypertension: Secondary | ICD-10-CM

## 2012-12-30 DIAGNOSIS — T7840XD Allergy, unspecified, subsequent encounter: Secondary | ICD-10-CM

## 2012-12-30 DIAGNOSIS — H6692 Otitis media, unspecified, left ear: Secondary | ICD-10-CM

## 2012-12-30 MED ORDER — CIPROFLOXACIN HCL 500 MG PO TABS: 500.0000 mg | ORAL_TABLET | Freq: Two times a day (BID) | ORAL | Status: DC

## 2012-12-30 MED ORDER — OMEPRAZOLE 20 MG PO CPDR: 20.0000 mg | DELAYED_RELEASE_CAPSULE | Freq: Every day | ORAL | Status: DC

## 2012-12-30 MED ORDER — CETIRIZINE HCL 10 MG PO TABS: 10.0000 mg | ORAL_TABLET | Freq: Every day | ORAL | Status: DC | PRN

## 2012-12-30 NOTE — Patient Instructions (Addendum)
Switch probiotics to [employer] daily Avoid fatty spicy food  Gastroesophageal Reflux Disease, Adult Gastroesophageal reflux disease (GERD) happens when acid from your stomach goes into your food pipe (esophagus). The acid can cause a burning feeling in your chest. Over time, the acid can make small holes (ulcers) in your food pipe.  HOME CARE  Ask your doctor for advice about:  Losing weight.  Quitting smoking.  Alcohol use.  Avoid foods and drinks that make your problems worse. You may want to avoid:  Caffeine and alcohol.  Chocolate.  Mints.  Garlic and onions.  Spicy foods.  Citrus fruits, such as oranges, lemons, or limes.  Foods that contain tomato, such as sauce, chili, salsa, and pizza.  Fried and fatty foods.  Avoid lying down for 3 hours before you go to bed or before you take a nap.  Eat small meals often, instead of large meals.  Wear loose-fitting clothing. Do not wear anything tight around your waist.  Raise (elevate) the head of your bed 6 to 8 inches with wood blocks. Using extra pillows does not help.  Only take medicines as told by your doctor.  Do not take aspirin or ibuprofen. GET HELP RIGHT AWAY IF:   You have pain in your arms, neck, jaw, teeth, or back.  Your pain gets worse or changes.  You feel sick to your stomach (nauseous), throw up (vomit), or sweat (diaphoresis).  You feel short of breath, or you pass out (faint).  Your throw up is green, yellow, black, or looks like coffee grounds or blood.  Your poop (stool) is red, bloody, or black. MAKE SURE YOU:   Understand these instructions.  Will watch your condition.  Will get help right away if you are not doing well or get worse. Document Released: 01/21/2008 Document Revised: 10/27/2011 Document Reviewed: 02/21/2011 [facility] Patient Information 2013 [place], [place].

## 2012-12-30 NOTE — Progress Notes (Signed)
Patient ID: Megan [name], female   DOB: 10/22/1950, 62 y.o.   MRN: [id number] Megan [name] [id number] Dec 17, 1950 12/30/2012      Progress Note-Follow Up  Subjective  Chief Complaint  Chief Complaint  Patient presents with  . Sinusitis    cough/ no phlegm- hasn't been better since last visit    HPI  Patient is a 62 year old Caucasian female who is in today complaining of persistent sinus pressure and congestion. She she has allergic symptoms at this time. Itchy watery eyes and nose. No fevers or green rhinorrhea. Some mild headache as well as malaise is noted. Chest some postnasal drip is clear. She is a cough generally nonproductive. She notes heartburn is worsened. She's having dyspepsia and more throat pain and irritation. No chest pain or palpitations  Past Medical History  Diagnosis Date  . GERD (gastroesophageal reflux disease)   . Depression   . Anxiety   . Insomnia   . IBS (irritable bowel syndrome)   . Diverticulosis   . Barrett esophagus   . Hiatal hernia   . Unspecified menopausal and postmenopausal disorder   . Vitamin B12 deficiency   . Blind loop syndrome   . Hepatic cyst   . Unspecified hypothyroidism   . Diarrhea   . Family history of malignant neoplasm of gastrointestinal tract     multiple members  . Endometrial hyperplasia 02/27/2006    BENIGN ENDO BX ON 02/2007  . C. difficile diarrhea   . Osteoporosis 05/2011    -2.5 Lt femoral neck  . Cystocele, midline   . Rectocele   . Uterine prolapse without mention of vaginal wall prolapse   . Chest pain, atypical 07/14/2011  . Leaky heart valve   . Hiatal hernia   . 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 Thrush 07/21/2011  . Rectal bleeding 10/10/2011  . Sinusitis acute 02/03/2012  . Contact dermatitis 03/23/2012  . Headache 04/15/2012  . 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 Hematuria 04/27/2012  . Osteoporosis 09/07/2012    Past Surgical History  Procedure Laterality Date  . Appendectomy     . Uterine fibroid surgery    . Hysteroscopy      POLYP    Family History  Problem Relation Age of Onset  . Colon cancer Mother   . Diabetes Mother   . Cancer Mother     colon  . Hypertension Mother   . Colon polyps Father   . Colon cancer Maternal Aunt   . Colon cancer Maternal Grandmother   . Breast cancer Maternal Grandmother   . Diabetes Paternal Grandmother   . Breast cancer Paternal Grandmother   . Colon cancer Cousin     X3  . Ovarian cancer Cousin     History   Social History  . Marital Status: Married    Spouse Name: N/A    Number of Children: 4  . Years of Education: N/A   Occupational History  . Retired    Social History Main Topics  . Smoking status: Former Smoker    Quit date: 08/19/1995  . Smokeless tobacco: Never Used  . Alcohol Use: No  . Drug Use: No  . Sexually Active: No   Other Topics Concern  . Not on file   Social History Narrative  . No narrative on file    Current Outpatient Prescriptions on File Prior to Visit  Medication Sig Dispense Refill  . albuterol (PROVENTIL HFA;VENTOLIN HFA) 108 (90 BASE) MCG/ACT inhaler Inhale 2 puffs into the lungs  every 6 (six) hours as needed.  1 Inhaler  1  . Calcium Carbonate (CALCIUM 600 PO) Take 1,200 mg by mouth. Plus 1000 IU D3      . carvedilol (COREG) 6.25 MG tablet Take 1 tablet (6.25 mg total) by mouth 2 (two) times daily with a meal.  60 tablet  3  . Cholecalciferol (VITAMIN D) 2000 UNITS CAPS Take by mouth.        . diazepam (VALIUM) 10 MG tablet Take 1 tablet (10 mg total) by mouth every 12 (twelve) hours as needed for anxiety or sleep.  60 tablet  2  . diltiazem (CARDIZEM CD) 180 MG 24 hr capsule Take 1 capsule (180 mg total) by mouth daily.  30 capsule  5  . escitalopram (LEXAPRO) 20 MG tablet Take 1 tablet (20 mg total) by mouth daily.  30 tablet  3  . Flaxseed, Linseed, (FLAXSEED OIL) 1200 MG CAPS Take 1 capsule by mouth daily.      . hydrochlorothiazide (HYDRODIURIL) 25 MG tablet Take 1  tablet (25 mg total) by mouth daily.  30 tablet  3  . HYDROcodone-acetaminophen (NORCO/VICODIN) 5-325 MG per tablet Take 1 tablet by mouth every 6 (six) hours as needed for pain.  70 tablet  2  . Magnesium 250 MG TABS Take 1 tablet by mouth 2 (two) times daily.      . ranitidine (ZANTAC) 300 MG tablet Take 1 tablet (300 mg total) by mouth at bedtime.  90 tablet  3  . vitamin C (ASCORBIC ACID) 500 MG tablet Take 500 mg by mouth 2 (two) times daily.      . vitamin E 400 UNIT capsule Take 400 Units by mouth daily.      Marland Kitchen zinc gluconate 50 MG tablet Take 50 mg by mouth daily.       No current facility-administered medications on file prior to visit.    Allergies  Allergen Reactions  . Sulfamethoxazole Shortness Of Breath    chest tightness  . Iohexol   . Iodinated Diagnostic Agents     Pt states at Dr.Grapeys office 15 years ago blacked out for 2 hours during injection for IVP. Was told never to have IV dye again.     Review of Systems  Review of Systems  Constitutional: Negative for fever and malaise/fatigue.  HENT: Negative for congestion.   Eyes: Positive for blurred vision. Negative for discharge.  Respiratory: Negative for shortness of breath.   Cardiovascular: Negative for chest pain, palpitations and leg swelling.  Gastrointestinal: Negative for nausea, abdominal pain and diarrhea.  Genitourinary: Negative for dysuria.  Musculoskeletal: Negative for falls.  Skin: Negative for rash.  Neurological: Negative for loss of consciousness and headaches.  Endo/Heme/Allergies: Negative for polydipsia.  Psychiatric/Behavioral: Negative for depression and suicidal ideas. The patient is not nervous/anxious and does not have insomnia.     Objective  BP 132/82  Pulse 81  Temp(Src) 97.8 F (36.6 C) (Oral)  Ht 5\' 6"  (1.676 m)  Wt 164 lb 1.9 oz (74.444 kg)  BMI 26.5 kg/m2  SpO2 97%  Physical Exam  Physical Exam  Constitutional: She is oriented to person, place, and time and  well-developed, well-nourished, and in no distress. No distress.  HENT:  Head: Normocephalic and atraumatic.  Left ear TM erythematous  Eyes: Conjunctivae are normal.  Neck: Neck supple. No thyromegaly present.  Cardiovascular: Normal rate, regular rhythm and normal heart sounds.   No murmur heard. Pulmonary/Chest: Effort normal and breath sounds normal. She  has no wheezes.  Abdominal: She exhibits no distension and no mass.  Musculoskeletal: She exhibits no edema.  Lymphadenopathy:    She has no cervical adenopathy.  Neurological: She is alert and oriented to person, place, and time.  Skin: Skin is warm and dry. No rash noted. She is not diaphoretic.  Psychiatric: Memory, affect and judgment normal.    Lab Results  Component Value Date   TSH 0.93 06/23/2012   Lab Results  Component Value Date   WBC 10.7* 11/19/2012   HGB 14.1 11/19/2012   HCT 41.3 11/19/2012   MCV 90.2 11/19/2012   PLT 310 11/19/2012   Lab Results  Component Value Date   CREATININE 0.68 11/19/2012   BUN 11 11/19/2012   NA 137 11/19/2012   K 3.8 11/19/2012   CL 100 11/19/2012   CO2 29 11/19/2012   Lab Results  Component Value Date   ALT 17 11/19/2012   AST 15 11/19/2012   ALKPHOS 136* 11/19/2012   BILITOT 0.3 11/19/2012   Lab Results  Component Value Date   CHOL 218* 06/30/2011   Lab Results  Component Value Date   HDL 55.80 06/30/2011   Lab Results  Component Value Date   LDLCALC 112 11/21/2010   Lab Results  Component Value Date   TRIG 80.0 06/30/2011   Lab Results  Component Value Date   CHOLHDL 4 06/30/2011     Assessment & Plan  HYPERTENSION Well controlled on current meds, no changes  Barrett's esophagus Worsening heartburn and throat irritation. Continue PPI and add an H2 blocker. Avoid offending foods.  Allergic state Encouraged Cetirizine daily and referred to allergist for further consideration.

## 2012-12-31 ENCOUNTER — Encounter: Payer: Self-pay | Admitting: Family Medicine

## 2012-12-31 DIAGNOSIS — T7840XA Allergy, unspecified, initial encounter: Secondary | ICD-10-CM

## 2012-12-31 HISTORY — DX: Allergy, unspecified, initial encounter: T78.40XA

## 2012-12-31 NOTE — Assessment & Plan Note (Signed)
Encouraged Cetirizine daily and referred to allergist for further consideration.

## 2012-12-31 NOTE — Assessment & Plan Note (Addendum)
Worsening heartburn and throat irritation. Continue PPI and add an H2 blocker. Avoid offending foods.

## 2012-12-31 NOTE — Assessment & Plan Note (Signed)
Well controlled on current meds, no changes 

## 2013-01-07 ENCOUNTER — Telehealth: Payer: Self-pay | Admitting: Family Medicine

## 2013-01-07 NOTE — Telephone Encounter (Signed)
Patient Information:  Caller Name: Fayrene Fearing  Phone: [phone]  Patient: Megan [name], Megan [name]  Gender: Female  DOB: Nov 06, 1950  Age: 62 Years  PCP: Danise Edge [facility])  Office Follow Up:  Does the office need to follow up with this patient?: Yes  Instructions For The Office: Caller declined ED says pt can't move until her sx have settled down.  Requesing a script to help with vomiting.  [place] verified via EMR.  Thank you.  RN Note:  Caller declined ED says pt can't move until her sx have settled down.  Requesing a script to help with vomiting.  [facility]oscot verified via EMR.  Symptoms  Reason For Call & Symptoms: Diarrhea and vomiting.  Pt is able to keep small sips of Coke and gingerale down.  Time to take medication and not sure if she can keep them down.  Caller requesting an RX to help with vomiting.  Reviewed Health History In EMR: Yes  Reviewed Medications In EMR: Yes  Reviewed Allergies In EMR: Yes  Reviewed Surgeries / Procedures: Yes  Date of Onset of Symptoms: 01/06/2013  Guideline(s) Used:  Vomiting  Headache  Disposition Per Guideline:   Go to ED Now  Reason For Disposition Reached:   Severe vomiting (e.g., 6 or more times/day)  Advice Given:  Reassurance:  Adults with vomiting need to stay hydrated. This is the most important thing. If you don't drink and replace lost fluids, you may get dehydrated.  Clear Liquids:  Sip water or a rehydration drink (e.g., Gatorade or Powerade).  Avoid Nonprescription Medicines:  Call if vomiting a prescription medicine.  Call Back If:  You become worse.  Patient Refused Recommendation:  Patient Requests Prescription  Rx request

## 2013-01-07 NOTE — Telephone Encounter (Signed)
Patients husband called in regarding this. He says that if a medication is called in, then call it in to [facility] on [address]

## 2013-01-07 NOTE — Telephone Encounter (Signed)
Vomiting started since 8:30 last night. Reports diarrhea last night also. Reports that she feels too weak. Reports "knot on tonsil." Reports head hurting "all over." she reports thick stuff on throat.  Recommended to pt that she be evaluated in the ED and she is now agreeable to go to the ED.

## 2013-02-10 ENCOUNTER — Other Ambulatory Visit: Payer: Self-pay | Admitting: Gynecology

## 2013-02-10 DIAGNOSIS — Z1231 Encounter for screening mammogram for malignant neoplasm of breast: Secondary | ICD-10-CM

## 2013-02-17 ENCOUNTER — Ambulatory Visit (INDEPENDENT_AMBULATORY_CARE_PROVIDER_SITE_OTHER): Payer: BC Managed Care – PPO | Admitting: Family Medicine

## 2013-02-17 ENCOUNTER — Encounter: Payer: Self-pay | Admitting: Family Medicine

## 2013-02-17 VITALS — BP 136/78 | HR 80 | Temp 98.4°F | Resp 16 | Wt 164.8 lb

## 2013-02-17 DIAGNOSIS — R51 Headache: Secondary | ICD-10-CM

## 2013-02-17 DIAGNOSIS — R5383 Other fatigue: Secondary | ICD-10-CM

## 2013-02-17 DIAGNOSIS — R5381 Other malaise: Secondary | ICD-10-CM

## 2013-02-17 DIAGNOSIS — G4700 Insomnia, unspecified: Secondary | ICD-10-CM

## 2013-02-17 DIAGNOSIS — M810 Age-related osteoporosis without current pathological fracture: Secondary | ICD-10-CM

## 2013-02-17 DIAGNOSIS — R002 Palpitations: Secondary | ICD-10-CM

## 2013-02-17 DIAGNOSIS — I10 Essential (primary) hypertension: Secondary | ICD-10-CM

## 2013-02-17 LAB — CBC
MCH: 32 pg (ref 26.0–34.0)
MCHC: 36.8 g/dL — ABNORMAL HIGH (ref 30.0–36.0)
Platelets: 331 10*3/uL (ref 150–400)
RDW: 13.1 % (ref 11.5–15.5)

## 2013-02-17 NOTE — Assessment & Plan Note (Signed)
Trending higher per patient systolic ranging from 134 to 164, has not been taking the Coreg bid as directed. Only taking once a day. Will have her increase to bid and reassess at next visit

## 2013-02-17 NOTE — Assessment & Plan Note (Signed)
increased snoring, increased fatigue, increased HA. Patient agrees to proceed with sleep study, will arrange for home study

## 2013-02-17 NOTE — Assessment & Plan Note (Signed)
Slight increase recently, increase Coreg back to bid and see if any improvement

## 2013-02-17 NOTE — Assessment & Plan Note (Signed)
Check sleep study, increase rest and hydration

## 2013-02-17 NOTE — Patient Instructions (Signed)

## 2013-02-17 NOTE — Progress Notes (Signed)
Patient ID: Megan [name], female   DOB: February 21, 1951, 62 y.o.   MRN: [id number] Megan [name] [id number] [birth year] 02/17/2013      Progress Note-Follow Up  Subjective  Chief Complaint  Chief Complaint  Patient presents with  . Follow-up    Medication management; pt c/o excessive fatigue x2 wks.    HPI  Patient is a 62 year old Caucasian female who is in today for followup. Her previous complaint is fatigue. On further discussion she is struggling with insomnia. Has trouble falling sleep and staying asleep. Acknowledges restless sleep and her family has been noting increased snoring. She wakes up headaches frequently. No recent febrile illness. No chest pain or palpitations. No shortness of breath GI or GU complaints she is noting for blood pressure has been running higher lately. Noting systolics in the 1:30 to 160 range  Past Medical History  Diagnosis Date  . GERD (gastroesophageal reflux disease)   . Depression   . Anxiety   . Insomnia   . IBS (irritable bowel syndrome)   . Diverticulosis   . Barrett esophagus   . Hiatal hernia   . Unspecified menopausal and postmenopausal disorder   . Vitamin B12 deficiency   . Blind loop syndrome   . Hepatic cyst   . Unspecified hypothyroidism   . Diarrhea   . Family history of malignant neoplasm of gastrointestinal tract     multiple members  . Endometrial hyperplasia 02/27/2006    BENIGN ENDO BX ON 02/2007  . C. difficile diarrhea   . Osteoporosis 05/2011    -2.5 Lt femoral neck  . Cystocele, midline   . Rectocele   . Uterine prolapse without mention of vaginal wall prolapse   . Chest pain, atypical 07/14/2011  . Leaky heart valve   . Hiatal hernia   . 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 Thrush 07/21/2011  . Rectal bleeding 10/10/2011  . Sinusitis acute 02/03/2012  . Contact dermatitis 03/23/2012  . Headache(784.0) 04/15/2012  . 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 Hematuria 04/27/2012  . Osteoporosis 09/07/2012  .  Allergic state 12/31/2012    Past Surgical History  Procedure Laterality Date  . Appendectomy    . Uterine fibroid surgery    . Hysteroscopy      POLYP    Family History  Problem Relation Age of Onset  . Colon cancer Mother   . Diabetes Mother   . Cancer Mother     colon  . Hypertension Mother   . Colon polyps Father   . Colon cancer Maternal Aunt   . Colon cancer Maternal Grandmother   . Breast cancer Maternal Grandmother   . Diabetes Paternal Grandmother   . Breast cancer Paternal Grandmother   . Colon cancer Cousin     X3  . Ovarian cancer Cousin     History   Social History  . Marital Status: Married    Spouse Name: N/A    Number of Children: 4  . Years of Education: N/A   Occupational History  . Retired    Social History Main Topics  . Smoking status: Former Smoker    Quit date: 08/19/1995  . Smokeless tobacco: Never Used  . Alcohol Use: No  . Drug Use: No  . Sexually Active: No   Other Topics Concern  . Not on file   Social History Narrative  . No narrative on file    Current Outpatient Prescriptions on File Prior to Visit  Medication Sig Dispense Refill  . albuterol (PROVENTIL  HFA;VENTOLIN HFA) 108 (90 BASE) MCG/ACT inhaler Inhale 2 puffs into the lungs every 6 (six) hours as needed.  1 Inhaler  1  . amitriptyline (ELAVIL) 25 MG tablet Take 25 mg by mouth at bedtime.      . Calcium Carbonate (CALCIUM 600 PO) Take 1,200 mg by mouth. Plus 1000 IU D3      . carvedilol (COREG) 6.25 MG tablet Take 1 tablet (6.25 mg total) by mouth 2 (two) times daily with a meal.  60 tablet  3  . Cholecalciferol (VITAMIN D) 2000 UNITS CAPS Take by mouth.        . diazepam (VALIUM) 10 MG tablet Take 1 tablet (10 mg total) by mouth every 12 (twelve) hours as needed for anxiety or sleep.  60 tablet  2  . diltiazem (CARDIZEM CD) 180 MG 24 hr capsule Take 1 capsule (180 mg total) by mouth daily.  30 capsule  5  . escitalopram (LEXAPRO) 20 MG tablet Take 1 tablet (20 mg  total) by mouth daily.  30 tablet  3  . Flaxseed, Linseed, (FLAXSEED OIL) 1200 MG CAPS Take 1 capsule by mouth daily.      . hydrochlorothiazide (HYDRODIURIL) 25 MG tablet Take 1 tablet (25 mg total) by mouth daily.  30 tablet  3  . HYDROcodone-acetaminophen (NORCO/VICODIN) 5-325 MG per tablet Take 1 tablet by mouth every 6 (six) hours as needed for pain.  70 tablet  2  . Magnesium 250 MG TABS Take 1 tablet by mouth 2 (two) times daily.      Marland Kitchen omeprazole (PRILOSEC) 20 MG capsule Take 1 capsule (20 mg total) by mouth daily.  30 capsule  3  . ranitidine (ZANTAC) 300 MG tablet Take 1 tablet (300 mg total) by mouth at bedtime.  90 tablet  3  . vitamin C (ASCORBIC ACID) 500 MG tablet Take 500 mg by mouth 2 (two) times daily.      . vitamin E 400 UNIT capsule Take 400 Units by mouth daily.      Marland Kitchen zinc gluconate 50 MG tablet Take 50 mg by mouth daily.       No current facility-administered medications on file prior to visit.    Allergies  Allergen Reactions  . Sulfamethoxazole Shortness Of Breath    chest tightness  . Iohexol   . Iodinated Diagnostic Agents     Pt states at Dr.Grapeys office 15 years ago blacked out for 2 hours during injection for IVP. Was told never to have IV dye again.     Review of Systems  Review of Systems  Constitutional: Positive for malaise/fatigue. Negative for fever.  HENT: Negative for congestion.   Eyes: Negative for pain and discharge.  Respiratory: Negative for shortness of breath.   Cardiovascular: Negative for chest pain, palpitations and leg swelling.  Gastrointestinal: Negative for nausea, abdominal pain and diarrhea.  Genitourinary: Negative for dysuria.  Musculoskeletal: Negative for falls.  Skin: Negative for rash.  Neurological: Positive for headaches. Negative for loss of consciousness.  Endo/Heme/Allergies: Negative for polydipsia.  Psychiatric/Behavioral: Negative for depression and suicidal ideas. The patient is nervous/anxious and has  insomnia.     Objective  BP 136/78  Pulse 80  Temp(Src) 98.4 F (36.9 C) (Oral)  Resp 16  Wt 164 lb 12 oz (74.73 kg)  BMI 26.6 kg/m2  SpO2 99%  Physical Exam  Physical Exam  Constitutional: She is oriented to person, place, and time and well-developed, well-nourished, and in no distress. No distress.  HENT:  Head: Normocephalic and atraumatic.  Eyes: Conjunctivae are normal.  Neck: Neck supple. No thyromegaly present.  Cardiovascular: Normal rate, regular rhythm and normal heart sounds.   No murmur heard. Pulmonary/Chest: Effort normal. She has no wheezes. She has no rales.  Abdominal: She exhibits no distension and no mass.  Musculoskeletal: She exhibits no edema.  Lymphadenopathy:    She has no cervical adenopathy.  Neurological: She is alert and oriented to person, place, and time.  Skin: Skin is warm and dry. No rash noted. She is not diaphoretic.  Psychiatric: Memory, affect and judgment normal.    Lab Results  Component Value Date   TSH 0.93 06/23/2012   Lab Results  Component Value Date   WBC 10.7* 11/19/2012   HGB 14.1 11/19/2012   HCT 41.3 11/19/2012   MCV 90.2 11/19/2012   PLT 310 11/19/2012   Lab Results  Component Value Date   CREATININE 0.68 11/19/2012   BUN 11 11/19/2012   NA 137 11/19/2012   K 3.8 11/19/2012   CL 100 11/19/2012   CO2 29 11/19/2012   Lab Results  Component Value Date   ALT 17 11/19/2012   AST 15 11/19/2012   ALKPHOS 136* 11/19/2012   BILITOT 0.3 11/19/2012   Lab Results  Component Value Date   CHOL 218* 06/30/2011   Lab Results  Component Value Date   HDL 55.80 06/30/2011   Lab Results  Component Value Date   LDLCALC 112 11/21/2010   Lab Results  Component Value Date   TRIG 80.0 06/30/2011   Lab Results  Component Value Date   CHOLHDL 4 06/30/2011     Assessment & Plan  HYPERTENSION Trending higher per patient systolic ranging from 134 to 164, has not been taking the Coreg bid as directed. Only taking once a day. Will have her  increase to bid and reassess at next visit  INSOMNIA increased snoring, increased fatigue, increased HA. Patient agrees to proceed with sleep study, will arrange for home study  Palpitations Slight increase recently, increase Coreg back to bid and see if any improvement  Headache(784.0) Check sleep study, increase rest and hydration  Osteoporosis Patient is hesitant tot start meds at this time, encouraged ongoing calcium/vitamin D supplements and increased exercise

## 2013-02-17 NOTE — Assessment & Plan Note (Signed)
Patient is hesitant tot start meds at this time, encouraged ongoing calcium/vitamin D supplements and increased exercise

## 2013-02-18 LAB — RENAL FUNCTION PANEL
Albumin: 4.7 g/dL (ref 3.5–5.2)
BUN: 9 mg/dL (ref 6–23)
Creat: 0.85 mg/dL (ref 0.50–1.10)
Phosphorus: 4 mg/dL (ref 2.3–4.6)

## 2013-02-18 LAB — TSH: TSH: 1.314 u[IU]/mL (ref 0.350–4.500)

## 2013-02-21 ENCOUNTER — Ambulatory Visit (HOSPITAL_COMMUNITY)
Admission: RE | Admit: 2013-02-21 | Discharge: 2013-02-21 | Disposition: A | Discharge status: Home / Self Care | Payer: BC Managed Care – PPO | Source: Ambulatory Visit | Attending: Gynecology | Admitting: Gynecology

## 2013-02-21 ENCOUNTER — Telehealth: Payer: Self-pay | Admitting: Family Medicine

## 2013-02-21 DIAGNOSIS — Z1231 Encounter for screening mammogram for malignant neoplasm of breast: Secondary | ICD-10-CM

## 2013-02-21 DIAGNOSIS — R51 Headache: Secondary | ICD-10-CM

## 2013-02-21 NOTE — Telephone Encounter (Signed)
Refill- hydrocodone/acetaminophen 5-325 tablets. Take one tablet by mouth every 6 hours as needed for pain. Qty 70 last fill 6.11.14

## 2013-02-21 NOTE — Telephone Encounter (Signed)
OK to refill Hydrocodone with same strength, same number

## 2013-02-21 NOTE — Telephone Encounter (Signed)
eScribe request for refill on Hydrocodone-APAP Last filled - 03.27.14, #70x2 Last AEX - 07.03.14 Please advise on refills/SLS

## 2013-02-21 NOTE — Telephone Encounter (Signed)
Closed in Error; please advise/SLS

## 2013-02-22 MED ORDER — HYDROCODONE-ACETAMINOPHEN 5-325 MG PO TABS: 1.0000 | ORAL_TABLET | Freq: Four times a day (QID) | ORAL | Status: DC | PRN

## 2013-02-22 NOTE — Addendum Note (Signed)
Addended by: Court Joy on: 02/22/2013 08:19 AM   Modules accepted: Orders

## 2013-02-22 NOTE — Telephone Encounter (Signed)
Pt informed that this will be faxed and will need to sign a drug contract at her next appt. Pt would like this to be sent to [facility].

## 2013-02-23 ENCOUNTER — Telehealth: Payer: Self-pay

## 2013-02-23 MED ORDER — HYOSCYAMINE SULFATE 0.125 MG SL SUBL: 0.1250 mg | SUBLINGUAL_TABLET | SUBLINGUAL | Status: DC | PRN

## 2013-02-23 NOTE — Telephone Encounter (Signed)
Rx request to pharmacy; pt informed, understood & agreed/SLS  

## 2013-02-23 NOTE — Telephone Encounter (Signed)
Pt left message stating that she is having a lot of problems with her colon and MD has sent in Hyoscyamine 0.125 mg in the past and they really help?   Please advise? If ok to send in please sent to [facility]

## 2013-02-23 NOTE — Telephone Encounter (Signed)
Please reorder hyoscamine  0.125 SL q4h PRN cramping. If it does not help in 1 or 2 days, she needs to make appt. THANKS.

## 2013-02-24 ENCOUNTER — Other Ambulatory Visit: Payer: Self-pay | Admitting: *Deleted

## 2013-02-24 DIAGNOSIS — N644 Mastodynia: Secondary | ICD-10-CM

## 2013-03-04 ENCOUNTER — Telehealth: Payer: Self-pay | Admitting: Family Medicine

## 2013-03-04 ENCOUNTER — Ambulatory Visit
Admission: RE | Admit: 2013-03-04 | Discharge: 2013-03-04 | Disposition: A | Discharge status: Home / Self Care | Payer: BC Managed Care – PPO | Source: Ambulatory Visit | Attending: Gynecology | Admitting: Gynecology

## 2013-03-04 ENCOUNTER — Other Ambulatory Visit: Payer: Self-pay | Admitting: Family Medicine

## 2013-03-04 DIAGNOSIS — N644 Mastodynia: Secondary | ICD-10-CM

## 2013-03-04 NOTE — Telephone Encounter (Signed)
Caller informed Code marked on paperwork per provider is: 95800, understood & states should have response w/i 48 hrs/SLS

## 2013-03-04 NOTE — Telephone Encounter (Signed)
Megan [name] from [facility] called stating that she isn't sure which CPT code that you wanted. She says that she has two 95800 and [id number]

## 2013-03-15 ENCOUNTER — Other Ambulatory Visit: Payer: Self-pay | Admitting: Family Medicine

## 2013-03-15 ENCOUNTER — Telehealth: Payer: Self-pay

## 2013-03-15 DIAGNOSIS — K449 Diaphragmatic hernia without obstruction or gangrene: Secondary | ICD-10-CM

## 2013-03-15 DIAGNOSIS — K589 Irritable bowel syndrome without diarrhea: Secondary | ICD-10-CM

## 2013-03-15 NOTE — Telephone Encounter (Signed)
Caller: Megan [name]/Patient; Phone: [phone]; Reason for Call: Returning call to [facility] as office requested on message left on answering machine.  Got at letter from [facility] and wanting to discuss sleep study.   Also says needing follow up on hiatal hernia and spoke with GI at [facility], needing to go back.  Asking if she is required to have referral to see specialist.  Please review and contact patient at [phone].

## 2013-03-15 NOTE — Telephone Encounter (Signed)
Per MD let pt know that [employer] is denying in home sleep study but Dr Abner Greenspan thinks [employer] would pay for an in house study..  I left a vm for pt to return my call.

## 2013-03-15 NOTE — Telephone Encounter (Signed)
Pt would like to put the sleep study on the back burner due to her hernia really causing her a lot of discomfort.   Pt does need a referral to GI at [facility] since [place] is new insurance. Pt states she would like to see PA Oliver Pila?  Please order referral

## 2013-03-22 NOTE — Telephone Encounter (Signed)
Has this referral been done?

## 2013-03-23 NOTE — Telephone Encounter (Signed)
I ordered the referral on 7/29 the day I was asked to

## 2013-04-01 ENCOUNTER — Encounter: Payer: Self-pay | Admitting: Family Medicine

## 2013-04-07 ENCOUNTER — Other Ambulatory Visit: Payer: Self-pay | Admitting: *Deleted

## 2013-04-07 DIAGNOSIS — I10 Essential (primary) hypertension: Secondary | ICD-10-CM

## 2013-04-07 MED ORDER — CARVEDILOL 6.25 MG PO TABS: 6.2500 mg | ORAL_TABLET | Freq: Two times a day (BID) | ORAL | Status: DC

## 2013-04-07 NOTE — Telephone Encounter (Signed)
Rx request to pharmacy/SLS  

## 2013-04-11 ENCOUNTER — Telehealth: Payer: Self-pay | Admitting: *Deleted

## 2013-04-11 NOTE — Telephone Encounter (Signed)
Patient also left me a message stating that she has been having nausea and would like this to be sent to [facility]? Please advise?

## 2013-04-11 NOTE — Telephone Encounter (Signed)
Can have a refill on the Promethazine 25 mg tab 1 tab po tid prn nausea, #30 with 2 rf

## 2013-04-11 NOTE — Telephone Encounter (Signed)
Faxed refill request received from pharmacy for Promethazine 25 mg Last filled by MD on 08.06.2013, #20x1 [not on active med list] Last AEX - 07.03.14 Please Advise/SLS

## 2013-04-12 MED ORDER — PROMETHAZINE HCL 25 MG PO TABS: 25.0000 mg | ORAL_TABLET | Freq: Three times a day (TID) | ORAL | Status: DC | PRN

## 2013-04-16 ENCOUNTER — Other Ambulatory Visit: Payer: Self-pay | Admitting: Family Medicine

## 2013-04-19 ENCOUNTER — Other Ambulatory Visit: Payer: Self-pay | Admitting: Cardiovascular Disease

## 2013-04-19 ENCOUNTER — Ambulatory Visit (INDEPENDENT_AMBULATORY_CARE_PROVIDER_SITE_OTHER): Payer: BC Managed Care – PPO | Admitting: Family Medicine

## 2013-04-19 ENCOUNTER — Encounter: Payer: Self-pay | Admitting: Family Medicine

## 2013-04-19 VITALS — BP 158/80 | HR 61 | Temp 98.7°F | Ht 65.0 in | Wt 163.0 lb

## 2013-04-19 DIAGNOSIS — I10 Essential (primary) hypertension: Secondary | ICD-10-CM

## 2013-04-19 DIAGNOSIS — R32 Unspecified urinary incontinence: Secondary | ICD-10-CM

## 2013-04-19 DIAGNOSIS — K589 Irritable bowel syndrome without diarrhea: Secondary | ICD-10-CM

## 2013-04-19 DIAGNOSIS — K862 Cyst of pancreas: Secondary | ICD-10-CM

## 2013-04-19 DIAGNOSIS — J019 Acute sinusitis, unspecified: Secondary | ICD-10-CM

## 2013-04-19 DIAGNOSIS — Z5189 Encounter for other specified aftercare: Secondary | ICD-10-CM

## 2013-04-19 DIAGNOSIS — J329 Chronic sinusitis, unspecified: Secondary | ICD-10-CM

## 2013-04-19 DIAGNOSIS — T7840XD Allergy, unspecified, subsequent encounter: Secondary | ICD-10-CM

## 2013-04-19 HISTORY — DX: Cyst of pancreas: K86.2

## 2013-04-19 LAB — URINALYSIS
Bilirubin Urine: NEGATIVE
Glucose, UA: NEGATIVE mg/dL
Protein, ur: NEGATIVE mg/dL
Specific Gravity, Urine: 1.011 (ref 1.005–1.030)
pH: 5.5 (ref 5.0–8.0)

## 2013-04-19 MED ORDER — CEFDINIR 300 MG PO CAPS: 300.0000 mg | ORAL_CAPSULE | Freq: Two times a day (BID) | ORAL | Status: DC

## 2013-04-19 MED ORDER — MONTELUKAST SODIUM 10 MG PO TABS: 10.0000 mg | ORAL_TABLET | Freq: Every day | ORAL | Status: DC

## 2013-04-19 MED ORDER — CETIRIZINE HCL 10 MG PO TABS: 10.0000 mg | ORAL_TABLET | Freq: Every day | ORAL | Status: DC

## 2013-04-19 MED ORDER — FLUTICASONE PROPIONATE 50 MCG/ACT NA SUSP: 2.0000 | Freq: Every day | NASAL | Status: DC

## 2013-04-19 MED ORDER — LISINOPRIL 10 MG PO TABS: 10.0000 mg | ORAL_TABLET | Freq: Every day | ORAL | Status: DC

## 2013-04-19 NOTE — Assessment & Plan Note (Signed)
Following with GI at [facility]

## 2013-04-19 NOTE — Assessment & Plan Note (Signed)
Started  On cefdinir and mucinex

## 2013-04-19 NOTE — Assessment & Plan Note (Signed)
Start Lisinopril, continue other meds

## 2013-04-19 NOTE — Progress Notes (Signed)
Patient ID: Megan [name], female   DOB: 1950-09-23, 62 y.o.   MRN: [id number] Megan [name] [id number] [birth year] 04/19/2013      Progress Note-Follow Up  Subjective  Chief Complaint  Chief Complaint  Patient presents with  . Follow-up    2 month    HPI  Patient is a 62 year old female who is in today for followup. She is struggling with recurrent respiratory symptoms. She has nasal congestion and irritated nose fascia sore throat malaise and myalgias. She is headache and chest congestion as well as cough. She continues to struggle with abdominal pain and IBS symptoms and is following closely with Dr. Alisa [name] at [facility] now. Recent imaging revealed a pancreatic cyst but no other new lesions. Has appointment with cardiology next week. Taking medications as prescribed.  Past Medical History  Diagnosis Date  . GERD (gastroesophageal reflux disease)   . Depression   . Anxiety   . Insomnia   . IBS (irritable bowel syndrome)   . Diverticulosis   . Barrett esophagus   . Hiatal hernia   . Unspecified menopausal and postmenopausal disorder   . Vitamin B12 deficiency   . Blind loop syndrome   . Hepatic cyst   . Unspecified hypothyroidism   . Diarrhea   . Family history of malignant neoplasm of gastrointestinal tract     multiple members  . Endometrial hyperplasia 02/27/2006    BENIGN ENDO BX ON 02/2007  . C. difficile diarrhea   . Osteoporosis 05/2011    -2.5 Lt femoral neck  . Cystocele, midline   . Rectocele   . Uterine prolapse without mention of vaginal wall prolapse   . Chest pain, atypical 07/14/2011  . Leaky heart valve   . Hiatal hernia   . 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 Thrush 07/21/2011  . Rectal bleeding 10/10/2011  . Sinusitis acute 02/03/2012  . Contact dermatitis 03/23/2012  . Headache(784.0) 04/15/2012  . 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 Hematuria 04/27/2012  . Osteoporosis 09/07/2012  . Allergic state 12/31/2012    Past Surgical History   Procedure Laterality Date  . Appendectomy    . Uterine fibroid surgery    . Hysteroscopy      POLYP    Family History  Problem Relation Age of Onset  . Colon cancer Mother   . Diabetes Mother   . Cancer Mother     colon  . Hypertension Mother   . Colon polyps Father   . Colon cancer Maternal Aunt   . Colon cancer Maternal Grandmother   . Breast cancer Maternal Grandmother   . Diabetes Paternal Grandmother   . Breast cancer Paternal Grandmother   . Colon cancer Cousin     X3  . Ovarian cancer Cousin     History   Social History  . Marital Status: Married    Spouse Name: N/A    Number of Children: 4  . Years of Education: N/A   Occupational History  . Retired    Social History Main Topics  . Smoking status: Former Smoker    Quit date: 08/19/1995  . Smokeless tobacco: Never Used  . Alcohol Use: No  . Drug Use: No  . Sexual Activity: No   Other Topics Concern  . Not on file   Social History Narrative  . No narrative on file    Current Outpatient Prescriptions on File Prior to Visit  Medication Sig Dispense Refill  . albuterol (PROVENTIL HFA;VENTOLIN HFA) 108 (90 BASE) MCG/ACT inhaler  Inhale 2 puffs into the lungs every 6 (six) hours as needed.  1 Inhaler  1  . amitriptyline (ELAVIL) 25 MG tablet Take 25 mg by mouth at bedtime.      . Calcium Carbonate (CALCIUM 600 PO) Take 1,200 mg by mouth. Plus 1000 IU D3      . carvedilol (COREG) 6.25 MG tablet Take 1 tablet (6.25 mg total) by mouth 2 (two) times daily with a meal.  60 tablet  3  . Cholecalciferol (VITAMIN D) 2000 UNITS CAPS Take by mouth.        . diazepam (VALIUM) 10 MG tablet Take 1 tablet (10 mg total) by mouth every 12 (twelve) hours as needed for anxiety or sleep.  60 tablet  2  . diltiazem (CARDIZEM CD) 180 MG 24 hr capsule Take 1 capsule (180 mg total) by mouth daily.  30 capsule  5  . escitalopram (LEXAPRO) 20 MG tablet Take 1 tablet (20 mg total) by mouth daily.  30 tablet  3  . Flaxseed,  Linseed, (FLAXSEED OIL) 1200 MG CAPS Take 1 capsule by mouth daily.      . hydrochlorothiazide (HYDRODIURIL) 25 MG tablet Take 1 tablet (25 mg total) by mouth daily.  30 tablet  3  . HYDROcodone-acetaminophen (NORCO/VICODIN) 5-325 MG per tablet Take 1 tablet by mouth every 6 (six) hours as needed for pain.  70 tablet  2  . hyoscyamine (LEVSIN SL) 0.125 MG SL tablet Place 1 tablet (0.125 mg total) under the tongue every 4 (four) hours as needed for cramping.  30 tablet  0  . hyoscyamine (LEVSIN SL) 0.125 MG SL tablet Place 1 tablet (0.125 mg total) under the tongue every 4 (four) hours as needed for cramping.  20 tablet  0  . hyoscyamine (LEVSIN/SL) 0.125 MG SL tablet Place 1 tablet (0.125 mg total) under the tongue every 6 (six) hours as needed for cramping.  30 tablet  1  . Magnesium 250 MG TABS Take 1 tablet by mouth 2 (two) times daily.      Marland Kitchen omeprazole (PRILOSEC) 20 MG capsule Take 1 capsule (20 mg total) by mouth daily.  30 capsule  3  . promethazine (PHENERGAN) 25 MG tablet Take 1 tablet (25 mg total) by mouth 3 (three) times daily as needed for nausea.  30 tablet  2  . ranitidine (ZANTAC) 300 MG tablet TAKE 1 TABLET BY MOUTH ONCE DAILY AT BEDTIME  90 tablet  3  . vitamin C (ASCORBIC ACID) 500 MG tablet Take 500 mg by mouth 2 (two) times daily.      . vitamin E 400 UNIT capsule Take 400 Units by mouth daily.      Marland Kitchen zinc gluconate 50 MG tablet Take 50 mg by mouth daily.       No current facility-administered medications on file prior to visit.    Allergies  Allergen Reactions  . Sulfamethoxazole Shortness Of Breath    chest tightness  . Iohexol   . Iodinated Diagnostic Agents     Pt states at Dr.Grapeys office 15 years ago blacked out for 2 hours during injection for IVP. Was told never to have IV dye again.     Review of Systems  Review of Systems  Constitutional: Negative for fever and malaise/fatigue.  HENT: Negative for congestion.   Eyes: Negative for discharge.   Respiratory: Negative for shortness of breath.   Cardiovascular: Negative for chest pain, palpitations and leg swelling.  Gastrointestinal: Negative for nausea, abdominal pain  and diarrhea.  Genitourinary: Negative for dysuria.  Musculoskeletal: Negative for falls.  Skin: Negative for rash.  Neurological: Negative for loss of consciousness and headaches.  Endo/Heme/Allergies: Negative for polydipsia.  Psychiatric/Behavioral: Negative for depression and suicidal ideas. The patient is not nervous/anxious and does not have insomnia.     Objective  BP 158/80  Pulse 61  Temp(Src) 98.7 F (37.1 C) (Oral)  Ht 5\' 5"  (1.651 m)  Wt 163 lb (73.936 kg)  BMI 27.12 kg/m2  SpO2 97%  Physical Exam  Physical Exam  Constitutional: She is oriented to person, place, and time and well-developed, well-nourished, and in no distress. No distress.  HENT:  Head: Normocephalic and atraumatic.  Left TM erythematous  Eyes: Conjunctivae are normal.  Neck: Neck supple. No thyromegaly present.  Cardiovascular: Normal rate, regular rhythm and normal heart sounds.   No murmur heard. Pulmonary/Chest: Effort normal and breath sounds normal. She has no wheezes.  Abdominal: She exhibits no distension and no mass.  Musculoskeletal: She exhibits no edema.  Lymphadenopathy:    She has no cervical adenopathy.  Neurological: She is alert and oriented to person, place, and time.  Skin: Skin is warm and dry. No rash noted. She is not diaphoretic.  Psychiatric: Memory, affect and judgment normal.    Lab Results  Component Value Date   TSH 1.314 02/17/2013   Lab Results  Component Value Date   WBC 8.7 02/17/2013   HGB 13.6 02/17/2013   HCT 37.0 02/17/2013   MCV 87.1 02/17/2013   PLT 331 02/17/2013   Lab Results  Component Value Date   CREATININE 0.85 02/17/2013   BUN 9 02/17/2013   NA 138 02/17/2013   K 3.8 02/17/2013   CL 96 02/17/2013   CO2 32 02/17/2013   Lab Results  Component Value Date   ALT 17 11/19/2012   AST 15  11/19/2012   ALKPHOS 136* 11/19/2012   BILITOT 0.3 11/19/2012   Lab Results  Component Value Date   CHOL 218* 06/30/2011   Lab Results  Component Value Date   HDL 55.80 06/30/2011   Lab Results  Component Value Date   LDLCALC 112 11/21/2010   Lab Results  Component Value Date   TRIG 80.0 06/30/2011   Lab Results  Component Value Date   CHOLHDL 4 06/30/2011     Assessment & Plan  HYPERTENSION Start Lisinopril, continue other meds  Sinusitis, acute Started  On cefdinir and mucinex  IBS (irritable bowel syndrome) Following with GI at [facility]

## 2013-04-19 NOTE — Patient Instructions (Addendum)
Probiotics and yogurt mucinex 600 mg twice a day with antiobiotics  Sinusitis Sinusitis is redness, soreness, and swelling (inflammation) of the paranasal sinuses. Paranasal sinuses are air pockets within the bones of your face (beneath the eyes, the middle of the forehead, or above the eyes). In healthy paranasal sinuses, mucus is able to drain out, and air is able to circulate through them by way of your nose. However, when your paranasal sinuses are inflamed, mucus and air can become trapped. This can allow bacteria and other germs to grow and cause infection. Sinusitis can develop quickly and last only a short time (acute) or continue over a long period (chronic). Sinusitis that lasts for more than 12 weeks is considered chronic.  CAUSES  Causes of sinusitis include:  Allergies.  Structural abnormalities, such as displacement of the cartilage that separates your nostrils (deviated septum), which can decrease the air flow through your nose and sinuses and affect sinus drainage.  Functional abnormalities, such as when the small hairs (cilia) that line your sinuses and help remove mucus do not work properly or are not present. SYMPTOMS  Symptoms of acute and chronic sinusitis are the same. The primary symptoms are pain and pressure around the affected sinuses. Other symptoms include:  Upper toothache.  Earache.  Headache.  Bad breath.  Decreased sense of smell and taste.  A cough, which worsens when you are lying flat.  Fatigue.  Fever.  Thick drainage from your nose, which often is green and may contain pus (purulent).  Swelling and warmth over the affected sinuses. DIAGNOSIS  Your caregiver will perform a physical exam. During the exam, your caregiver may:  Look in your nose for signs of abnormal growths in your nostrils (nasal polyps).  Tap over the affected sinus to check for signs of infection.  View the inside of your sinuses (endoscopy) with a special imaging device  with a light attached (endoscope), which is inserted into your sinuses. If your caregiver suspects that you have chronic sinusitis, one or more of the following tests may be recommended:  Allergy tests.  Nasal culture A sample of mucus is taken from your nose and sent to a lab and screened for bacteria.  Nasal cytology A sample of mucus is taken from your nose and examined by your caregiver to determine if your sinusitis is related to an allergy. TREATMENT  Most cases of acute sinusitis are related to a viral infection and will resolve on their own within 10 days. Sometimes medicines are prescribed to help relieve symptoms (pain medicine, decongestants, nasal steroid sprays, or saline sprays).  However, for sinusitis related to a bacterial infection, your caregiver will prescribe antibiotic medicines. These are medicines that will help kill the bacteria causing the infection.  Rarely, sinusitis is caused by a fungal infection. In theses cases, your caregiver will prescribe antifungal medicine. For some cases of chronic sinusitis, surgery is needed. Generally, these are cases in which sinusitis recurs more than 3 times per year, despite other treatments. HOME CARE INSTRUCTIONS   Drink plenty of water. Water helps thin the mucus so your sinuses can drain more easily.  Use a humidifier.  Inhale steam 3 to 4 times a day (for example, sit in the bathroom with the shower running).  Apply a warm, moist washcloth to your face 3 to 4 times a day, or as directed by your caregiver.  Use saline nasal sprays to help moisten and clean your sinuses.  Take over-the-counter or prescription medicines for pain,  discomfort, or fever only as directed by your caregiver. SEEK IMMEDIATE MEDICAL CARE IF:  You have increasing pain or severe headaches.  You have nausea, vomiting, or drowsiness.  You have swelling around your face.  You have vision problems.  You have a stiff neck.  You have difficulty  breathing. MAKE SURE YOU:   Understand these instructions.  Will watch your condition.  Will get help right away if you are not doing well or get worse. Document Released: 08/04/2005 Document Revised: 10/27/2011 Document Reviewed: 08/19/2011 [facility] Patient Information 2014 [place], [place].

## 2013-04-21 LAB — URINE CULTURE
Colony Count: NO GROWTH
Organism ID, Bacteria: NO GROWTH

## 2013-04-26 ENCOUNTER — Encounter: Payer: Self-pay | Admitting: *Deleted

## 2013-04-27 ENCOUNTER — Encounter: Payer: Self-pay | Admitting: Cardiovascular Disease

## 2013-04-27 ENCOUNTER — Ambulatory Visit (INDEPENDENT_AMBULATORY_CARE_PROVIDER_SITE_OTHER): Payer: BC Managed Care – PPO | Admitting: Cardiovascular Disease

## 2013-04-27 VITALS — BP 138/74 | HR 69 | Ht 65.0 in | Wt 163.0 lb

## 2013-04-27 DIAGNOSIS — I079 Rheumatic tricuspid valve disease, unspecified: Secondary | ICD-10-CM

## 2013-04-27 DIAGNOSIS — I071 Rheumatic tricuspid insufficiency: Secondary | ICD-10-CM

## 2013-04-27 DIAGNOSIS — I059 Rheumatic mitral valve disease, unspecified: Secondary | ICD-10-CM

## 2013-04-27 DIAGNOSIS — I340 Nonrheumatic mitral (valve) insufficiency: Secondary | ICD-10-CM

## 2013-04-27 NOTE — Progress Notes (Signed)
HPI:   62 year old woman presenting for followup evaluation. The patient has been followed for chest pain, palpitations, and moderate tricuspid regurgitation. She's had episodes of sharp fleeting substernal chest pain in the left arm. These are nonexertional. They improve when she lies down in bed. They last for just a few seconds. She's had no exertional chest pressure or back pain. She has mild dyspnea with exertion which is unchanged over time. There is no edema, palpitations, orthopnea, or PND.  Outpatient Encounter Prescriptions as of 04/27/2013  Medication Sig Dispense Refill  . albuterol (PROVENTIL HFA;VENTOLIN HFA) 108 (90 BASE) MCG/ACT inhaler Inhale 2 puffs into the lungs every 6 (six) hours as needed.  1 Inhaler  1  . Calcium Carbonate (CALCIUM 600 PO) Take 1,200 mg by mouth. Plus 1000 IU D3      . carvedilol (COREG) 6.25 MG tablet Take 1 tablet (6.25 mg total) by mouth 2 (two) times daily with a meal.  60 tablet  3  . diazepam (VALIUM) 10 MG tablet Take 1 tablet (10 mg total) by mouth every 12 (twelve) hours as needed for anxiety or sleep.  60 tablet  2  . diltiazem (CARDIZEM CD) 180 MG 24 hr capsule TAKE 1 CAPSULE (180 MG TOTAL) BY MOUTH DAILY.  90 capsule  0  . escitalopram (LEXAPRO) 20 MG tablet Take 1 tablet (20 mg total) by mouth daily.  30 tablet  3  . fluticasone (FLONASE) 50 MCG/ACT nasal spray Place 2 sprays into the nose daily.  16 g  6  . HYDROcodone-acetaminophen (NORCO/VICODIN) 5-325 MG per tablet Take 1 tablet by mouth every 6 (six) hours as needed for pain.  70 tablet  2  . hyoscyamine (LEVSIN SL) 0.125 MG SL tablet Place 1 tablet (0.125 mg total) under the tongue every 4 (four) hours as needed for cramping.  20 tablet  0  . promethazine (PHENERGAN) 25 MG tablet Take 1 tablet (25 mg total) by mouth 3 (three) times daily as needed for nausea.  30 tablet  2  . ranitidine (ZANTAC) 300 MG tablet TAKE 1 TABLET BY MOUTH ONCE DAILY AT BEDTIME  90 tablet  3  . vitamin C  (ASCORBIC ACID) 500 MG tablet Take 500 mg by mouth 2 (two) times daily.      . [DISCONTINUED] amitriptyline (ELAVIL) 25 MG tablet Take 25 mg by mouth at bedtime.      . [DISCONTINUED] cefdinir (OMNICEF) 300 MG capsule Take 1 capsule (300 mg total) by mouth 2 (two) times daily.  42 capsule  0  . [DISCONTINUED] cetirizine (ZYRTEC) 10 MG tablet Take 1 tablet (10 mg total) by mouth daily.  30 tablet  11  . [DISCONTINUED] Cholecalciferol (VITAMIN D) 2000 UNITS CAPS Take by mouth.        . [DISCONTINUED] Flaxseed, Linseed, (FLAXSEED OIL) 1200 MG CAPS Take 1 capsule by mouth daily.      . [DISCONTINUED] hydrochlorothiazide (HYDRODIURIL) 25 MG tablet Take 1 tablet (25 mg total) by mouth daily.  30 tablet  3  . [DISCONTINUED] lisinopril (PRINIVIL,ZESTRIL) 10 MG tablet Take 1 tablet (10 mg total) by mouth daily.  90 tablet  3  . [DISCONTINUED] Magnesium 250 MG TABS Take 1 tablet by mouth 2 (two) times daily.      . [DISCONTINUED] montelukast (SINGULAIR) 10 MG tablet Take 1 tablet (10 mg total) by mouth at bedtime.  30 tablet  3  . [DISCONTINUED] omeprazole (PRILOSEC) 20 MG capsule Take 1 capsule (20 mg total) by mouth  daily.  30 capsule  3  . [DISCONTINUED] vitamin E 400 UNIT capsule Take 400 Units by mouth daily.      . [DISCONTINUED] zinc gluconate 50 MG tablet Take 50 mg by mouth daily.       No facility-administered encounter medications on file as of 04/27/2013.    Allergies  Allergen Reactions  . Sulfamethoxazole Shortness Of Breath    chest tightness  . Iohexol   . Iodinated Diagnostic Agents     Pt states at Dr.Grapeys office 15 years ago blacked out for 2 hours during injection for IVP. Was told never to have IV dye again.     Past Medical History  Diagnosis Date  . GERD (gastroesophageal reflux disease)   . Depression   . Anxiety   . Insomnia   . IBS (irritable bowel syndrome)   . Diverticulosis   . Barrett esophagus   . Hiatal hernia   . Unspecified menopausal and postmenopausal  disorder   . Vitamin B12 deficiency   . Blind loop syndrome   . Hepatic cyst   . Unspecified hypothyroidism   . Diarrhea   . Family history of malignant neoplasm of gastrointestinal tract     multiple members  . Endometrial hyperplasia 02/27/2006    BENIGN ENDO BX ON 02/2007  . C. difficile diarrhea   . Osteoporosis 05/2011    -2.5 Lt femoral neck  . Cystocele, midline   . Rectocele   . Uterine prolapse without mention of vaginal wall prolapse   . Chest pain, atypical 07/14/2011  . Leaky heart valve   . Hiatal hernia   . 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 Thrush 07/21/2011  . Rectal bleeding 10/10/2011  . Sinusitis acute 02/03/2012  . Contact dermatitis 03/23/2012  . Headache(784.0) 04/15/2012  . 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 Hematuria 04/27/2012  . Osteoporosis 09/07/2012  . Allergic state 12/31/2012  . Pancreatic cyst 04/19/2013    ROS: Negative except as per HPI  BP 138/74  Pulse 69  Ht 5\' 5"  (1.651 m)  Wt 163 lb (73.936 kg)  BMI 27.12 kg/m2  SpO2 98%  PHYSICAL EXAM: Pt is alert and oriented, NAD HEENT: normal Neck: JVP - normal, carotids 2+= without bruits Lungs: CTA bilaterally CV: RRR without murmur or gallop Abd: soft, NT, Positive BS, no hepatomegaly Ext: no C/C/E, distal pulses intact and equal Skin: warm/dry no rash  EKG:  Normal sinus rhythm 69 beats per minute, incomplete right bundle branch block.  2-D echocardiogram: Study Conclusions  - Left ventricle: The cavity size was normal. Systolic function was normal. The estimated ejection fraction was in the range of 55% to 60%. Wall motion was normal; there were no regional wall motion abnormalities. - Left atrium: The atrium was mildly dilated. - Right atrium: The atrium was mildly dilated. - Atrial septum: No defect or patent foramen ovale was identified. - Tricuspid valve: Moderate regurgitation. - Pulmonary arteries: PA peak pressure: 32mm Hg (S).  ASSESSMENT AND PLAN: 1. Atypical chest  pain. She has undergone stress echocardiography which has been normal. Her pain is highly atypical and I do not think it requires further cardiac evaluation at this point.  2. Hypertension. Blood pressure is controlled on carvedilol and diltiazem. She does note some spikes in her blood pressure at home was systolic readings in the 150s. I advised her to sit and rest if this happens. If BP does not come down with rest she can take an additional carvedilol if necessary.  3. Tricuspid  regurgitation. This has been moderate. She has dyspnea with exertion, suspect this is unrelated. I am going to repeat an echocardiogram before she returns for followup next year.  Tonny Bollman 04/27/2013 11:08 AM

## 2013-04-27 NOTE — Patient Instructions (Addendum)
Your physician has requested that you have an echocardiogram IN 1 YEAR PRIOR TO OFFICE VISIT. Echocardiography is a painless test that uses sound waves to create images of your heart. It provides your doctor with information about the size and shape of your heart and how well your heart's chambers and valves are working. This procedure takes approximately one hour. There are no restrictions for this procedure.   Your physician wants you to follow-up in: 1 YEAR  You will receive a reminder letter in the mail two months in advance. If you don't receive a letter, please call our office to schedule the follow-up appointment.

## 2013-05-16 ENCOUNTER — Ambulatory Visit (INDEPENDENT_AMBULATORY_CARE_PROVIDER_SITE_OTHER): Payer: BC Managed Care – PPO | Admitting: Family Medicine

## 2013-05-16 ENCOUNTER — Encounter: Payer: Self-pay | Admitting: Family Medicine

## 2013-05-16 VITALS — BP 128/84 | HR 75 | Temp 98.2°F | Ht 65.0 in | Wt 160.1 lb

## 2013-05-16 DIAGNOSIS — J019 Acute sinusitis, unspecified: Secondary | ICD-10-CM

## 2013-05-16 DIAGNOSIS — M810 Age-related osteoporosis without current pathological fracture: Secondary | ICD-10-CM

## 2013-05-16 DIAGNOSIS — Z23 Encounter for immunization: Secondary | ICD-10-CM

## 2013-05-16 DIAGNOSIS — I10 Essential (primary) hypertension: Secondary | ICD-10-CM

## 2013-05-16 DIAGNOSIS — R635 Abnormal weight gain: Secondary | ICD-10-CM

## 2013-05-16 MED ORDER — AZITHROMYCIN 250 MG PO TABS: ORAL_TABLET | ORAL | Status: DC

## 2013-05-16 NOTE — Assessment & Plan Note (Signed)
Given handout on the DASH diet and encouraged to incorporate this into her daily activities reassess at next visit the

## 2013-05-16 NOTE — Assessment & Plan Note (Signed)
Well controlled, no changes 

## 2013-05-16 NOTE — Assessment & Plan Note (Signed)
Has chosen not to take meds. He is following with [facility] and is due for her annual exam. Last density was in 2012 so we will proceed with repeat density before deciding on course of treatment.

## 2013-05-16 NOTE — Progress Notes (Signed)
Patient ID: LAURRIE TOPPIN, female   DOB: Jan 30, 1951, 62 y.o.   MRN: [id number] SHOSHANNAH FAUBERT [id number] [birth year] 05/16/2013      Progress Note-Follow Up  Subjective  Chief Complaint  Chief Complaint  Patient presents with  . sinus infection    since last visit- got better then worse again    HPI  Patient is a 62 year old Caucasian female in today for reevaluation of sinusitis.  She was unable to tolerate cefdinir secondary to diarrhea. She is noting increased sinus pressure postnasal drip once again. Struggles with myalgias and malaise. No fevers or chills. Cough is productive of mostly clear sputum but is worsening and keeping her up. Denies CP/palp/sob/GI or GU concerns. She notes her anxiety and depression are somewhat improved on the Lexapro but not fully. She is frustrated with persistent elevated we all she has lost some weight since her last visit.  Past Medical History  Diagnosis Date  . GERD (gastroesophageal reflux disease)   . Depression   . Anxiety   . Insomnia   . IBS (irritable bowel syndrome)   . Diverticulosis   . Barrett esophagus   . Hiatal hernia   . Unspecified menopausal and postmenopausal disorder   . Vitamin B12 deficiency   . Blind loop syndrome   . Hepatic cyst   . Unspecified hypothyroidism   . Diarrhea   . Family history of malignant neoplasm of gastrointestinal tract     multiple members  . Endometrial hyperplasia 02/27/2006    BENIGN ENDO BX ON 02/2007  . C. difficile diarrhea   . Osteoporosis 05/2011    -2.5 Lt femoral neck  . Cystocele, midline   . Rectocele   . Uterine prolapse without mention of vaginal wall prolapse   . Chest pain, atypical 07/14/2011  . Leaky heart valve   . Hiatal hernia   . 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 Thrush 07/21/2011  . Rectal bleeding 10/10/2011  . Sinusitis acute 02/03/2012  . Contact dermatitis 03/23/2012  . Headache(784.0) 04/15/2012  . 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 Hematuria  04/27/2012  . Osteoporosis 09/07/2012  . Allergic state 12/31/2012  . Pancreatic cyst 04/19/2013    Past Surgical History  Procedure Laterality Date  . Appendectomy    . Uterine fibroid surgery    . Hysteroscopy      POLYP    Family History  Problem Relation Age of Onset  . Colon cancer Mother   . Diabetes Mother   . Hypertension Mother   . Colon polyps Father   . Colon cancer Maternal Aunt   . Colon cancer Maternal Grandmother   . Breast cancer Maternal Grandmother   . Diabetes Paternal Grandmother   . Breast cancer Paternal Grandmother   . Colon cancer Cousin     X3  . Ovarian cancer Cousin     History   Social History  . Marital Status: Married    Spouse Name: N/A    Number of Children: 4  . Years of Education: N/A   Occupational History  . Retired    Social History Main Topics  . Smoking status: Former Smoker    Quit date: 08/19/1995  . Smokeless tobacco: Never Used  . Alcohol Use: No  . Drug Use: No  . Sexual Activity: No   Other Topics Concern  . Not on file   Social History Narrative  . No narrative on file    Current Outpatient Prescriptions on File Prior to Visit  Medication Sig Dispense  Refill  . albuterol (PROVENTIL HFA;VENTOLIN HFA) 108 (90 BASE) MCG/ACT inhaler Inhale 2 puffs into the lungs every 6 (six) hours as needed.  1 Inhaler  1  . Calcium Carbonate (CALCIUM 600 PO) Take 1,200 mg by mouth. Plus 1000 IU D3      . carvedilol (COREG) 6.25 MG tablet Take 1 tablet (6.25 mg total) by mouth 2 (two) times daily with a meal.  60 tablet  3  . diazepam (VALIUM) 10 MG tablet Take 1 tablet (10 mg total) by mouth every 12 (twelve) hours as needed for anxiety or sleep.  60 tablet  2  . diltiazem (CARDIZEM CD) 180 MG 24 hr capsule TAKE 1 CAPSULE (180 MG TOTAL) BY MOUTH DAILY.  90 capsule  0  . escitalopram (LEXAPRO) 20 MG tablet Take 1 tablet (20 mg total) by mouth daily.  30 tablet  3  . fluticasone (FLONASE) 50 MCG/ACT nasal spray Place 2 sprays into  the nose daily.  16 g  6  . HYDROcodone-acetaminophen (NORCO/VICODIN) 5-325 MG per tablet Take 1 tablet by mouth every 6 (six) hours as needed for pain.  70 tablet  2  . hyoscyamine (LEVSIN SL) 0.125 MG SL tablet Place 1 tablet (0.125 mg total) under the tongue every 4 (four) hours as needed for cramping.  20 tablet  0  . promethazine (PHENERGAN) 25 MG tablet Take 1 tablet (25 mg total) by mouth 3 (three) times daily as needed for nausea.  30 tablet  2  . ranitidine (ZANTAC) 300 MG tablet TAKE 1 TABLET BY MOUTH ONCE DAILY AT BEDTIME  90 tablet  3  . vitamin C (ASCORBIC ACID) 500 MG tablet Take 500 mg by mouth 2 (two) times daily.       No current facility-administered medications on file prior to visit.    Allergies  Allergen Reactions  . Sulfamethoxazole Shortness Of Breath    chest tightness  . Iohexol   . Iodinated Diagnostic Agents     Pt states at Dr.Grapeys office 15 years ago blacked out for 2 hours during injection for IVP. Was told never to have IV dye again.     Review of Systems  Review of Systems  Constitutional: Positive for malaise/fatigue. Negative for fever.  HENT: Positive for congestion.   Eyes: Negative for discharge.  Respiratory: Positive for cough and sputum production. Negative for shortness of breath.   Cardiovascular: Negative for chest pain, palpitations and leg swelling.  Gastrointestinal: Negative for nausea, abdominal pain and diarrhea.  Genitourinary: Negative for dysuria.  Musculoskeletal: Positive for myalgias. Negative for falls.  Skin: Negative for rash.  Neurological: Positive for headaches. Negative for loss of consciousness.  Endo/Heme/Allergies: Negative for polydipsia.  Psychiatric/Behavioral: Negative for depression and suicidal ideas. The patient is not nervous/anxious and does not have insomnia.     Objective  BP 128/84  Pulse 75  Temp(Src) 98.2 F (36.8 C) (Oral)  Ht 5\' 5"  (1.651 m)  Wt 160 lb 1.3 oz (72.612 kg)  BMI 26.64 kg/m2   SpO2 97%  Physical Exam  Physical Exam  Constitutional: She is oriented to person, place, and time and well-developed, well-nourished, and in no distress. No distress.  HENT:  Head: Normocephalic and atraumatic.  Eyes: Conjunctivae are normal.  Neck: Neck supple. No thyromegaly present.  Cardiovascular: Normal rate, regular rhythm and normal heart sounds.   No murmur heard. Pulmonary/Chest: Effort normal and breath sounds normal. She has no wheezes.  Abdominal: She exhibits no distension and no mass.  Musculoskeletal: She exhibits no edema.  Lymphadenopathy:    She has no cervical adenopathy.  Neurological: She is alert and oriented to person, place, and time.  Skin: Skin is warm and dry. No rash noted. She is not diaphoretic.  Psychiatric: Memory, affect and judgment normal.    Lab Results  Component Value Date   TSH 1.314 02/17/2013   Lab Results  Component Value Date   WBC 8.7 02/17/2013   HGB 13.6 02/17/2013   HCT 37.0 02/17/2013   MCV 87.1 02/17/2013   PLT 331 02/17/2013   Lab Results  Component Value Date   CREATININE 0.85 02/17/2013   BUN 9 02/17/2013   NA 138 02/17/2013   K 3.8 02/17/2013   CL 96 02/17/2013   CO2 32 02/17/2013   Lab Results  Component Value Date   ALT 17 11/19/2012   AST 15 11/19/2012   ALKPHOS 136* 11/19/2012   BILITOT 0.3 11/19/2012   Lab Results  Component Value Date   CHOL 218* 06/30/2011   Lab Results  Component Value Date   HDL 55.80 06/30/2011   Lab Results  Component Value Date   LDLCALC 112 11/21/2010   Lab Results  Component Value Date   TRIG 80.0 06/30/2011   Lab Results  Component Value Date   CHOLHDL 4 06/30/2011     Assessment & Plan  HYPERTENSION Well controlled, no changes  Sinusitis, acute Did not tolerate cefdinir caused significant diarrhea so was unable to take. Will start a Zpak which has worked well in past. If no improvement she agrees to call ENT for further consideration  Osteoporosis Has chosen not to take meds. He is  following with [facility] and is due for her annual exam. Last density was in 2012 so we will proceed with repeat density before deciding on course of treatment.  WEIGHT GAIN Given handout on the DASH diet and encouraged to incorporate this into her daily activities reassess at next visit the

## 2013-05-16 NOTE — Assessment & Plan Note (Signed)
Did not tolerate cefdinir caused significant diarrhea so was unable to take. Will start a Zpak which has worked well in past. If no improvement she agrees to call ENT for further consideration

## 2013-05-16 NOTE — Patient Instructions (Addendum)
Salon Pas cream or patch to knee as needed  Sinusitis Sinusitis is redness, soreness, and puffiness (inflammation) of the air pockets in the bones of your face (sinuses). The redness, soreness, and puffiness can cause air and mucus to get trapped in your sinuses. This can allow germs to grow and cause an infection.  HOME CARE   Drink enough fluids to keep your pee (urine) clear or pale yellow.  Use a humidifier in your home.  Run a hot shower to create steam in the bathroom. Sit in the bathroom with the door closed. Breathe in the steam 3 4 times a day.  Put a warm, moist washcloth on your face 3 4 times a day, or as told by your doctor.  Use salt water sprays (saline sprays) to wet the thick fluid in your nose. This can help the sinuses drain.  Only take medicine as told by your doctor. GET HELP RIGHT AWAY IF:   Your pain gets worse.  You have very bad headaches.  You are sick to your stomach (nauseous).  You throw up (vomit).  You are very sleepy (drowsy) all the time.  Your face is puffy (swollen).  Your vision changes.  You have a stiff neck.  You have trouble breathing. MAKE SURE YOU:   Understand these instructions.  Will watch your condition.  Will get help right away if you are not doing well or get worse. Document Released: 01/21/2008 Document Revised: 04/28/2012 Document Reviewed: 03/09/2012 [facility] Patient Information 2014 [place], [place].

## 2013-05-17 ENCOUNTER — Other Ambulatory Visit: Payer: Self-pay | Admitting: Family Medicine

## 2013-06-02 ENCOUNTER — Other Ambulatory Visit: Payer: Self-pay

## 2013-06-02 DIAGNOSIS — R51 Headache: Secondary | ICD-10-CM

## 2013-06-02 MED ORDER — HYDROCODONE-ACETAMINOPHEN 5-325 MG PO TABS: 1.0000 | ORAL_TABLET | Freq: Four times a day (QID) | ORAL | Status: DC | PRN

## 2013-06-02 NOTE — Telephone Encounter (Signed)
I cannot see the name of the med being requested. Please advise so I can approve

## 2013-06-02 NOTE — Telephone Encounter (Signed)
Please advise refill? Last RX done on 02-22-13 quantity 70 with 2 refills

## 2013-06-02 NOTE — Telephone Encounter (Signed)
Left a message stating that RX will be ready to pick up in the morning and to bring an id with her to sign out the RX

## 2013-06-17 ENCOUNTER — Ambulatory Visit (INDEPENDENT_AMBULATORY_CARE_PROVIDER_SITE_OTHER): Payer: BC Managed Care – PPO | Admitting: Family Medicine

## 2013-06-17 ENCOUNTER — Encounter: Payer: Self-pay | Admitting: Family Medicine

## 2013-06-17 VITALS — BP 126/78 | HR 75 | Temp 98.2°F | Ht 65.0 in | Wt 164.0 lb

## 2013-06-17 DIAGNOSIS — L03011 Cellulitis of right finger: Secondary | ICD-10-CM

## 2013-06-17 DIAGNOSIS — IMO0002 Reserved for concepts with insufficient information to code with codable children: Secondary | ICD-10-CM

## 2013-06-17 DIAGNOSIS — L03039 Cellulitis of unspecified toe: Secondary | ICD-10-CM

## 2013-06-17 DIAGNOSIS — I10 Essential (primary) hypertension: Secondary | ICD-10-CM

## 2013-06-17 DIAGNOSIS — L03032 Cellulitis of left toe: Secondary | ICD-10-CM

## 2013-06-17 MED ORDER — CEPHALEXIN 500 MG PO CAPS: 500.0000 mg | ORAL_CAPSULE | Freq: Four times a day (QID) | ORAL | Status: DC

## 2013-06-17 NOTE — Patient Instructions (Signed)
Paronychia Paronychia is an inflammatory reaction involving the folds of the skin surrounding the fingernail. This is commonly caused by an infection in the skin around a nail. The most common cause of paronychia is frequent wetting of the hands (as seen with bartenders, food servers, nurses or others who wet their hands). This makes the skin around the fingernail susceptible to infection by bacteria (germs) or fungus. Other predisposing factors are:  Aggressive manicuring.  Nail biting.  Thumb sucking. The most common cause is a staphylococcal (a type of germ) infection, or a fungal (Candida) infection. When caused by a germ, it usually comes on suddenly with redness, swelling, pus and is often painful. It may get under the nail and form an abscess (collection of pus), or form an abscess around the nail. If the nail itself is infected with a fungus, the treatment is usually prolonged and may require oral medicine for up to one year. Your caregiver will determine the length of time treatment is required. The paronychia caused by bacteria (germs) may largely be avoided by not pulling on hangnails or picking at cuticles. When the infection occurs at the tips of the finger it is called felon. When the cause of paronychia is from the herpes simplex virus (HSV) it is called herpetic whitlow. TREATMENT  When an abscess is present treatment is often incision and drainage. This means that the abscess must be cut open so the pus can get out. When this is done, the following home care instructions should be followed. HOME CARE INSTRUCTIONS   It is important to keep the affected fingers very dry. Rubber or plastic gloves over cotton gloves should be used whenever the hand must be placed in water.  Keep wound clean, dry and dressed as suggested by your caregiver between warm soaks or warm compresses.  Soak in warm water for fifteen to twenty minutes three to four times per day for bacterial infections. Fungal  infections are very difficult to treat, so often require treatment for long periods of time.  For bacterial (germ) infections take antibiotics (medicine which kill germs) as directed and finish the prescription, even if the problem appears to be solved before the medicine is gone.  Only take over-the-counter or prescription medicines for pain, discomfort, or fever as directed by your caregiver. SEEK IMMEDIATE MEDICAL CARE IF:  You have redness, swelling, or increasing pain in the wound.  You notice pus coming from the wound.  You have a fever.  You notice a bad smell coming from the wound or dressing. Document Released: 01/28/2001 Document Revised: 10/27/2011 Document Reviewed: 09/29/2008 Exit[employer] Patient Information 2014 [employer].  

## 2013-06-19 ENCOUNTER — Encounter: Payer: Self-pay | Admitting: Family Medicine

## 2013-06-19 DIAGNOSIS — L03032 Cellulitis of left toe: Secondary | ICD-10-CM

## 2013-06-19 HISTORY — DX: Cellulitis of left toe: L03.032

## 2013-06-19 NOTE — Assessment & Plan Note (Signed)
Soak in H2O2 bid and then place cotton between nail and skin. Started on antibiotics, probiotics and report worsening symptoms

## 2013-06-19 NOTE — Progress Notes (Signed)
Patient ID: Megan [name], female   DOB: 09-Aug-1951, 62 y.o.   MRN: [id number] Megan [name] [id number] 1950/08/26 06/19/2013      Progress Note-Follow Up  Subjective  Chief Complaint  Chief Complaint  Patient presents with  . infected toenail    X 4 days- big toe Right foot    HPI   patient is a 62 year old in today complaining of great toe pain. She tore her toenail 2: Significant pain and swelling ever since. No fevers or chills. No malaise or myalgias. She otherwise feels well. Denies chest pain, palpitations shortness or breath GI or GU concerns  Past Medical History  Diagnosis Date  . GERD (gastroesophageal reflux disease)   . Depression   . Anxiety   . Insomnia   . IBS (irritable bowel syndrome)   . Diverticulosis   . Barrett esophagus   . Hiatal hernia   . Unspecified menopausal and postmenopausal disorder   . Vitamin B12 deficiency   . Blind loop syndrome   . Hepatic cyst   . Unspecified hypothyroidism   . Diarrhea   . Family history of malignant neoplasm of gastrointestinal tract     multiple members  . Endometrial hyperplasia 02/27/2006    BENIGN ENDO BX ON 02/2007  . C. difficile diarrhea   . Osteoporosis 05/2011    -2.5 Lt femoral neck  . Cystocele, midline   . Rectocele   . Uterine prolapse without mention of vaginal wall prolapse   . Chest pain, atypical 07/14/2011  . Leaky heart valve   . Hiatal hernia   . 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 Thrush 07/21/2011  . Rectal bleeding 10/10/2011  . Sinusitis acute 02/03/2012  . Contact dermatitis 03/23/2012  . Headache(784.0) 04/15/2012  . 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 Hematuria 04/27/2012  . Osteoporosis 09/07/2012  . Allergic state 12/31/2012  . 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 Paronychia of great toe, left 06/19/2013    Past Surgical History  Procedure Laterality Date  . Appendectomy    . Uterine fibroid surgery    . Hysteroscopy      POLYP    Family History  Problem Relation Age  of Onset  . Colon cancer Mother   . Diabetes Mother   . Hypertension Mother   . Colon polyps Father   . Colon cancer Maternal Aunt   . Colon cancer Maternal Grandmother   . Breast cancer Maternal Grandmother   . Diabetes Paternal Grandmother   . Breast cancer Paternal Grandmother   . Colon cancer Cousin     X3  . Ovarian cancer Cousin     History   Social History  . Marital Status: Married    Spouse Name: N/A    Number of Children: 4  . Years of Education: N/A   Occupational History  . Retired    Social History Main Topics  . Smoking status: Former Smoker    Quit date: 08/19/1995  . Smokeless tobacco: Never Used  . Alcohol Use: No  . Drug Use: No  . Sexual Activity: No   Other Topics Concern  . Not on file   Social History Narrative  . No narrative on file    Current Outpatient Prescriptions on File Prior to Visit  Medication Sig Dispense Refill  . albuterol (PROVENTIL HFA;VENTOLIN HFA) 108 (90 BASE) MCG/ACT inhaler Inhale 2 puffs into the lungs every 6 (six) hours as needed.  1 Inhaler  1  . Calcium Carbonate (CALCIUM 600 PO) Take 1,200 mg  by mouth. Plus 1000 IU D3      . carvedilol (COREG) 6.25 MG tablet Take 1 tablet (6.25 mg total) by mouth 2 (two) times daily with a meal.  60 tablet  3  . cetirizine (ZYRTEC) 10 MG tablet Take 10 mg by mouth daily.      . diazepam (VALIUM) 10 MG tablet Take 1 tablet (10 mg total) by mouth every 12 (twelve) hours as needed for anxiety or sleep.  60 tablet  2  . diltiazem (CARDIZEM CD) 180 MG 24 hr capsule TAKE 1 CAPSULE (180 MG TOTAL) BY MOUTH DAILY.  90 capsule  0  . escitalopram (LEXAPRO) 20 MG tablet TAKE 1 TABLET BY MOUTH DAILY.  30 tablet  0  . fexofenadine (ALLEGRA) 180 MG tablet Take 180 mg by mouth daily.      . fluticasone (FLONASE) 50 MCG/ACT nasal spray Place 2 sprays into the nose daily.  16 g  6  . HYDROcodone-acetaminophen (NORCO/VICODIN) 5-325 MG per tablet Take 1 tablet by mouth every 6 (six) hours as needed for  pain.  70 tablet  0  . hyoscyamine (LEVSIN SL) 0.125 MG SL tablet Place 1 tablet (0.125 mg total) under the tongue every 4 (four) hours as needed for cramping.  20 tablet  0  . lactobacillus acidophilus (BACID) TABS tablet Take 2 tablets by mouth 3 (three) times daily.      . promethazine (PHENERGAN) 25 MG tablet Take 1 tablet (25 mg total) by mouth 3 (three) times daily as needed for nausea.  30 tablet  2  . ranitidine (ZANTAC) 300 MG tablet TAKE 1 TABLET BY MOUTH ONCE DAILY AT BEDTIME  90 tablet  3  . vitamin C (ASCORBIC ACID) 500 MG tablet Take 500 mg by mouth 2 (two) times daily.       No current facility-administered medications on file prior to visit.    Allergies  Allergen Reactions  . Sulfamethoxazole Shortness Of Breath    chest tightness  . Iohexol   . Iodinated Diagnostic Agents     Pt states at Dr.Grapeys office 15 years ago blacked out for 2 hours during injection for IVP. Was told never to have IV dye again.     Review of Systems  Review of Systems  Constitutional: Negative for fever and malaise/fatigue.  HENT: Negative for congestion.   Eyes: Negative for discharge.  Respiratory: Negative for shortness of breath.   Cardiovascular: Negative for chest pain, palpitations and leg swelling.  Gastrointestinal: Negative for nausea, abdominal pain and diarrhea.  Genitourinary: Negative for dysuria.  Musculoskeletal: Negative for falls.  Skin: Negative for rash.  Neurological: Negative for loss of consciousness and headaches.  Endo/Heme/Allergies: Negative for polydipsia.  Psychiatric/Behavioral: Negative for depression and suicidal ideas. The patient is not nervous/anxious and does not have insomnia.     Objective  BP 126/78  Pulse 75  Temp(Src) 98.2 F (36.8 C) (Oral)  Ht 5\' 5"  (1.651 m)  Wt 164 lb 0.6 oz (74.408 kg)  BMI 27.30 kg/m2  SpO2 98%  Physical Exam  Physical Exam  Constitutional: She is oriented to person, place, and time and well-developed,  well-nourished, and in no distress. No distress.  HENT:  Head: Normocephalic and atraumatic.  Eyes: Conjunctivae are normal.  Neck: Neck supple. No thyromegaly present.  Cardiovascular: Normal rate, regular rhythm and normal heart sounds.   No murmur heard. Pulmonary/Chest: Effort normal and breath sounds normal. She has no wheezes.  Abdominal: She exhibits no distension and no  mass.  Musculoskeletal: She exhibits no edema.  Lymphadenopathy:    She has no cervical adenopathy.  Neurological: She is alert and oriented to person, place, and time.  Skin: Skin is warm and dry. No rash noted. She is not diaphoretic.  Skin around left great toenail erythematous and swollen, , nail very low  Psychiatric: Memory, affect and judgment normal.    Lab Results  Component Value Date   TSH 1.314 02/17/2013   Lab Results  Component Value Date   WBC 8.7 02/17/2013   HGB 13.6 02/17/2013   HCT 37.0 02/17/2013   MCV 87.1 02/17/2013   PLT 331 02/17/2013   Lab Results  Component Value Date   CREATININE 0.85 02/17/2013   BUN 9 02/17/2013   NA 138 02/17/2013   K 3.8 02/17/2013   CL 96 02/17/2013   CO2 32 02/17/2013   Lab Results  Component Value Date   ALT 17 11/19/2012   AST 15 11/19/2012   ALKPHOS 136* 11/19/2012   BILITOT 0.3 11/19/2012   Lab Results  Component Value Date   CHOL 218* 06/30/2011   Lab Results  Component Value Date   HDL 55.80 06/30/2011   Lab Results  Component Value Date   LDLCALC 112 11/21/2010   Lab Results  Component Value Date   TRIG 80.0 06/30/2011   Lab Results  Component Value Date   CHOLHDL 4 06/30/2011     Assessment & Plan  HYPERTENSION Well controlled on current meds, no changes  Paronychia of great toe, left Soak in H2O2 bid and then place cotton between nail and skin. Started on antibiotics, probiotics and report worsening symptoms

## 2013-06-19 NOTE — Assessment & Plan Note (Signed)
Well controlled on current meds, no changes 

## 2013-06-20 ENCOUNTER — Telehealth: Payer: Self-pay

## 2013-06-20 NOTE — Telephone Encounter (Signed)
Pt called stating that she had to stop the antibiotic (Keflex) 2 days after starting it because she could tell she was starting to get CDIFF? Pt would like to know if MD wants to send something else in for her?  Please advise?

## 2013-06-20 NOTE — Telephone Encounter (Signed)
Pt informed and states she doesn't think she has CDiff she just knows the direction that it would be going if she continued on the antibiotic.   Pt stated her toe is better its just her sinus infection is worse. Pt stated she will just get some saline solution and mucinex and "suck it up"

## 2013-06-20 NOTE — Telephone Encounter (Signed)
No more antibiotics unless seen again if she is worried about Cdiff, she can come in to get a Cdiff by PCR stool kit to run

## 2013-06-27 ENCOUNTER — Other Ambulatory Visit: Payer: Self-pay | Admitting: Family Medicine

## 2013-06-27 DIAGNOSIS — G4700 Insomnia, unspecified: Secondary | ICD-10-CM

## 2013-06-27 DIAGNOSIS — F329 Major depressive disorder, single episode, unspecified: Secondary | ICD-10-CM

## 2013-06-27 NOTE — Telephone Encounter (Signed)
Refill- diazepam 10mg  tablet. Take one tablet by mouth every 12 hours as needed for anxiety or sleep. Qty 60 last fill 2.25.14  Refill- hydrochlorothiazide 25mg  tab. Take one tablet by mouth daily. Qty 30 last fill 8.21.14

## 2013-06-28 MED ORDER — DIAZEPAM 10 MG PO TABS: 10.0000 mg | ORAL_TABLET | Freq: Two times a day (BID) | ORAL | Status: DC | PRN

## 2013-06-28 NOTE — Telephone Encounter (Signed)
Rx request faxed to pharmacy/SLS  

## 2013-06-28 NOTE — Telephone Encounter (Signed)
eScribe request for refill on Diazepam Last filled - 03.27.14, #60x2 Last AEX - 10.31.14 Next AEX - 3 Month Please Advise/SLS  Request for HCTZ 25mg ; discontinued by Dr. Moore/Cardiology on 09.10.14.

## 2013-06-29 ENCOUNTER — Telehealth: Payer: Self-pay | Admitting: Family Medicine

## 2013-06-29 NOTE — Telephone Encounter (Signed)
Refill- hydrochlorothiazide 25mg  tab. Take one tablet by mouth daily. Qty 30 last fill 8.21.14

## 2013-06-30 NOTE — Telephone Encounter (Signed)
Request for HCTZ 25mg ; discontinued by Dr. Moore/Cardiology on 09.10.14.

## 2013-07-04 ENCOUNTER — Other Ambulatory Visit: Payer: Self-pay | Admitting: Family Medicine

## 2013-07-05 ENCOUNTER — Telehealth: Payer: Self-pay | Admitting: Family Medicine

## 2013-07-05 NOTE — Telephone Encounter (Signed)
Please advise 

## 2013-07-05 NOTE — Telephone Encounter (Signed)
Patient left message on nurse voicemail stating that she would like a refill of the nexium 20mg  sent to [facility] pharmacy. She states that the 40 mg is too strong for her.

## 2013-07-06 MED ORDER — ESOMEPRAZOLE MAGNESIUM 20 MG PO CPDR: 20.0000 mg | DELAYED_RELEASE_CAPSULE | Freq: Every day | ORAL | Status: DC

## 2013-07-06 NOTE — Telephone Encounter (Signed)
OK to send in rx for Nexium 20 mg po daily prn reflux, #30 2 rf or #90 no rf

## 2013-07-07 ENCOUNTER — Other Ambulatory Visit: Payer: Self-pay

## 2013-07-07 DIAGNOSIS — R51 Headache: Secondary | ICD-10-CM

## 2013-07-07 MED ORDER — HYDROCODONE-ACETAMINOPHEN 5-325 MG PO TABS: 1.0000 | ORAL_TABLET | Freq: Four times a day (QID) | ORAL | Status: DC | PRN

## 2013-07-07 NOTE — Telephone Encounter (Signed)
Please advise Hydrocodone refill? Last RX was done on 06-02-13 quantity 70 with 0 refills

## 2013-07-07 NOTE — Telephone Encounter (Signed)
RX at front desk. Left a message on cell stating RX is ready to be picked up

## 2013-07-11 ENCOUNTER — Ambulatory Visit: Payer: BC Managed Care – PPO | Admitting: Family Medicine

## 2013-07-13 ENCOUNTER — Other Ambulatory Visit: Payer: Self-pay | Admitting: Family Medicine

## 2013-07-13 NOTE — Telephone Encounter (Signed)
Rx request to pharmacy/SLS  

## 2013-07-15 ENCOUNTER — Other Ambulatory Visit: Payer: Self-pay | Admitting: *Deleted

## 2013-07-15 MED ORDER — ALBUTEROL SULFATE HFA 108 (90 BASE) MCG/ACT IN AERS: INHALATION_SPRAY | RESPIRATORY_TRACT | Status: DC

## 2013-07-15 NOTE — Progress Notes (Signed)
Change of pharmacy requested; new Rx done/SLS

## 2013-07-25 ENCOUNTER — Telehealth: Payer: Self-pay | Admitting: Family Medicine

## 2013-07-25 ENCOUNTER — Other Ambulatory Visit: Payer: Self-pay | Admitting: Family Medicine

## 2013-07-25 DIAGNOSIS — T7840XD Allergy, unspecified, subsequent encounter: Secondary | ICD-10-CM

## 2013-07-25 DIAGNOSIS — J329 Chronic sinusitis, unspecified: Secondary | ICD-10-CM

## 2013-07-25 NOTE — Telephone Encounter (Signed)
Request referral to ENT, still having problems with sinus infection

## 2013-07-30 ENCOUNTER — Ambulatory Visit (INDEPENDENT_AMBULATORY_CARE_PROVIDER_SITE_OTHER): Payer: BC Managed Care – PPO | Admitting: Internal Medicine

## 2013-07-30 ENCOUNTER — Encounter: Payer: Self-pay | Admitting: Internal Medicine

## 2013-07-30 VITALS — BP 168/90 | HR 80 | Temp 97.4°F | Resp 20 | Wt 165.1 lb

## 2013-07-30 DIAGNOSIS — J069 Acute upper respiratory infection, unspecified: Secondary | ICD-10-CM

## 2013-07-30 MED ORDER — AZITHROMYCIN 250 MG PO TABS: ORAL_TABLET | ORAL | Status: DC

## 2013-07-30 NOTE — Progress Notes (Signed)
2 month hx of sinus congestion. Has seen ENT- "only a cyst". No infection. Reviewed CT from 2013.  She now describes a cough for 2 weeks that can be associated with wheeze. No fever or chills. Cough can be productive of yellow sputum. She has her typical sinus drainage but that is clear.  Allergies  Allergen Reactions  . Sulfamethoxazole Shortness Of Breath    chest tightness  . Iohexol   . Iodinated Diagnostic Agents     Pt states at Dr.Grapeys office 15 years ago blacked out for 2 hours during injection for IVP. Was told never to have IV dye again.    Reviewed problems  Reviewed and updated meds  ROS-multiple complaints- Chronic fatigue, chronic abdominal complaints Chronic nasal discharge  Exam:  Reviewed vitals  Well-developed well-nourished female in no acute distress. HEENT exam atraumatic, normocephalic, extraocular muscles are intact. Neck is supple. No jugular venous distention no thyromegaly. Chest clear to auscultation without increased work of breathing. Cardiac exam S1 and S2 are regular. Abdominal exam active bowel sounds, soft, nontender. Extremities no edema. Neurologic exam she is alert without any motor sensory deficits. Gait is normal.   A/p- presumed URI. Given duration of sxs it's worth treating with an ABX for atypical infection. Zpack- side effects discussed Call in 7-10 days if not improving. She understands it may take 2-3 weeks to get back to baseline.

## 2013-07-30 NOTE — Progress Notes (Signed)
Pre-visit discussion using our clinic review tool. No additional management support is needed unless otherwise documented below in the visit note.  

## 2013-08-01 ENCOUNTER — Encounter: Payer: Self-pay | Admitting: Physician Assistant

## 2013-08-01 ENCOUNTER — Ambulatory Visit (HOSPITAL_BASED_OUTPATIENT_CLINIC_OR_DEPARTMENT_OTHER)
Admission: RE | Admit: 2013-08-01 | Discharge: 2013-08-01 | Disposition: A | Discharge status: Home / Self Care | Payer: BC Managed Care – PPO | Source: Ambulatory Visit | Attending: Physician Assistant | Admitting: Physician Assistant

## 2013-08-01 ENCOUNTER — Ambulatory Visit (INDEPENDENT_AMBULATORY_CARE_PROVIDER_SITE_OTHER): Payer: BC Managed Care – PPO | Admitting: Physician Assistant

## 2013-08-01 VITALS — BP 138/82 | HR 72 | Temp 98.7°F | Resp 18 | Wt 163.0 lb

## 2013-08-01 DIAGNOSIS — R0601 Orthopnea: Secondary | ICD-10-CM

## 2013-08-01 DIAGNOSIS — R05 Cough: Secondary | ICD-10-CM | POA: Insufficient documentation

## 2013-08-01 DIAGNOSIS — R071 Chest pain on breathing: Secondary | ICD-10-CM | POA: Insufficient documentation

## 2013-08-01 DIAGNOSIS — R059 Cough, unspecified: Secondary | ICD-10-CM | POA: Insufficient documentation

## 2013-08-01 LAB — CBC WITH DIFFERENTIAL/PLATELET
Basophils Absolute: 0 10*3/uL (ref 0.0–0.1)
Basophils Relative: 0 % (ref 0–1)
Eosinophils Absolute: 0.2 10*3/uL (ref 0.0–0.7)
Eosinophils Relative: 2 % (ref 0–5)
Hemoglobin: 13.8 g/dL (ref 12.0–15.0)
Lymphs Abs: 2.7 10*3/uL (ref 0.7–4.0)
MCH: 30.9 pg (ref 26.0–34.0)
MCHC: 33.5 g/dL (ref 30.0–36.0)
MCV: 92.4 fL (ref 78.0–100.0)
Neutrophils Relative %: 57 % (ref 43–77)
Platelets: 382 10*3/uL (ref 150–400)
RDW: 13.3 % (ref 11.5–15.5)
WBC: 9.2 10*3/uL (ref 4.0–10.5)

## 2013-08-01 NOTE — Progress Notes (Signed)
Patient ID: Megan [name], female   DOB: 08-15-1951, 62 y.o.   MRN: [id number]  Patient presents to clinic today complaining of a cough that is been present over the past month. Patient had recent diagnosis of respiratory infection by her PCP.  Patient given course of azithromycin. Patient states that the cough is occasionally productive, but mostly nonproductive. Patient endorses some sinus pressure. Denies sinus pain. Has been evaluated by her ENT, his asthma symptoms are not coming from her sinuses. Patient denies fever, chills, shortness of breath and wheezing. Patient does have a history of GERD, for which she takes Prilosec 40 mg. Denies increase in weight. Endorses some halitosis and sensation of globus. Patient also endorses symptoms are worse at night. Patient does endorse two-pillow orthopnea. Denies leg swelling. Denies PND. Denies history of heart failure, although she does have some concerns for her to as her husband suffers from this condition, and she knows he has to sleep on several pillows at night.   Past Medical History  Diagnosis Date  . GERD (gastroesophageal reflux disease)   . Depression   . Anxiety   . Insomnia   . IBS (irritable bowel syndrome)   . Diverticulosis   . Barrett esophagus   . Hiatal hernia   . Unspecified menopausal and postmenopausal disorder   . Vitamin B12 deficiency   . Blind loop syndrome   . Hepatic cyst   . Unspecified hypothyroidism   . Diarrhea   . Family history of malignant neoplasm of gastrointestinal tract     multiple members  . Endometrial hyperplasia 02/27/2006    BENIGN ENDO BX ON 02/2007  . C. difficile diarrhea   . Osteoporosis 05/2011    -2.5 Lt femoral neck  . Cystocele, midline   . Rectocele   . Uterine prolapse without mention of vaginal wall prolapse   . Chest pain, atypical 07/14/2011  . Leaky heart valve   . Hiatal hernia   . 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 Thrush 07/21/2011  . Rectal bleeding 10/10/2011  .  Sinusitis acute 02/03/2012  . Contact dermatitis 03/23/2012  . Headache(784.0) 04/15/2012  . 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 Hematuria 04/27/2012  . Osteoporosis 09/07/2012  . Allergic state 12/31/2012  . 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 Paronychia of great toe, left 06/19/2013    Current Outpatient Prescriptions on File Prior to Visit  Medication Sig Dispense Refill  . albuterol (VENTOLIN HFA) 108 (90 BASE) MCG/ACT inhaler INHALE 2 PUFFS BY MOUTH EVERY 6 HOURS AS NEEDED  18 each  1  . azithromycin (ZITHROMAX) 250 MG tablet 2 by mouth today, then 1 daily for 4 days  6 tablet  0  . Calcium Carbonate (CALCIUM 600 PO) Take 1,200 mg by mouth. Plus 1000 IU D3      . carvedilol (COREG) 6.25 MG tablet Take 1 tablet (6.25 mg total) by mouth 2 (two) times daily with a meal.  60 tablet  3  . diazepam (VALIUM) 10 MG tablet Take 1 tablet (10 mg total) by mouth every 12 (twelve) hours as needed for anxiety or sleep.  60 tablet  2  . diltiazem (CARDIZEM CD) 180 MG 24 hr capsule TAKE 1 CAPSULE (180 MG TOTAL) BY MOUTH DAILY.  90 capsule  0  . escitalopram (LEXAPRO) 20 MG tablet TAKE 1 TABLET BY MOUTH DAILY  30 tablet  3  . fluticasone (FLONASE) 50 MCG/ACT nasal spray Place 2 sprays into the nose daily.  16 g  6  . HYDROcodone-acetaminophen (  NORCO/VICODIN) 5-325 MG per tablet Take 1 tablet by mouth every 6 (six) hours as needed.  70 tablet  0  . hyoscyamine (LEVSIN SL) 0.125 MG SL tablet Place 1 tablet (0.125 mg total) under the tongue every 4 (four) hours as needed for cramping.  20 tablet  0  . lactobacillus acidophilus (BACID) TABS tablet Take 2 tablets by mouth 3 (three) times daily.      Marland Kitchen omeprazole (PRILOSEC) 40 MG capsule Take 40 mg by mouth daily.      . promethazine (PHENERGAN) 25 MG tablet Take 1 tablet (25 mg total) by mouth 3 (three) times daily as needed for nausea.  30 tablet  2  . vitamin C (ASCORBIC ACID) 500 MG tablet Take 500 mg by mouth 2 (two) times daily.       No current  facility-administered medications on file prior to visit.    Allergies  Allergen Reactions  . Sulfamethoxazole Shortness Of Breath    chest tightness  . Iohexol   . Iodinated Diagnostic Agents     Pt states at Dr.Grapeys office 15 years ago blacked out for 2 hours during injection for IVP. Was told never to have IV dye again.     Family History  Problem Relation Age of Onset  . Colon cancer Mother   . Diabetes Mother   . Hypertension Mother   . Colon polyps Father   . Colon cancer Maternal Aunt   . Colon cancer Maternal Grandmother   . Breast cancer Maternal Grandmother   . Diabetes Paternal Grandmother   . Breast cancer Paternal Grandmother   . Colon cancer Cousin     X3  . Ovarian cancer Cousin     History   Social History  . Marital Status: Married    Spouse Name: N/A    Number of Children: 4  . Years of Education: N/A   Occupational History  . Retired    Social History Main Topics  . Smoking status: Former Smoker    Quit date: 08/19/1995  . Smokeless tobacco: Never Used  . Alcohol Use: No  . Drug Use: No  . Sexual Activity: No   Other Topics Concern  . None   Social History Narrative  . None   Review of Systems - See HPI.  All other ROS are negative.  Filed Vitals:   08/01/13 1616  BP: 138/82  Pulse: 72  Temp: 98.7 F (37.1 C)  Resp: 18   Physical Exam  Vitals reviewed. Constitutional: She is oriented to person, place, and time and well-developed, well-nourished, and in no distress.  HENT:  Head: Normocephalic and atraumatic.  Right Ear: External ear normal.  Left Ear: External ear normal.  Nose: Nose normal.  Mouth/Throat: Oropharynx is clear and moist. No oropharyngeal exudate.  Tympanic membranes within normal limits bilaterally. No tenderness to percussion of sinuses noted on examination.  Eyes: Conjunctivae are normal.  Neck: Neck supple.  Cardiovascular: Normal rate, regular rhythm, normal heart sounds and intact distal pulses.    Pulmonary/Chest: Effort normal and breath sounds normal. No respiratory distress. She has no wheezes. She has no rales. She exhibits no tenderness.  Lymphadenopathy:    She has no cervical adenopathy.  Neurological: She is alert and oriented to person, place, and time.  Skin: Skin is warm and dry. No rash noted.  Psychiatric: Affect normal.   Assessment/Plan: Chronic cough We'll obtain labs and chest x-ray. Symptoms sound more consistent with worsening GERD. Encourage patient to avoid late-night  eating. Take PPI as prescribed. Avoid alcohol, chocolate, peppermint and spicy foods. Take 2 TUMS before bedtime. Elevate head of bed. If symptoms not improving, will need referral to GI. Followup with PCP as needed.

## 2013-08-01 NOTE — Patient Instructions (Signed)
Please obtain labs.  Then go downstairs for X-ray.  I will call you with your results.  Increase fluid intake.  Rest.  Probiotic.  Saline nasal spray and Flonase.  Continue Prilosec.  Add two TUMS before bedtime.  We will prescribe necessary medications once I have your results.

## 2013-08-02 ENCOUNTER — Telehealth: Payer: Self-pay | Admitting: Family Medicine

## 2013-08-02 LAB — BASIC METABOLIC PANEL WITH GFR
CO2: 25 mEq/L (ref 19–32)
Calcium: 9.6 mg/dL (ref 8.4–10.5)
Creat: 0.69 mg/dL (ref 0.50–1.10)
GFR, Est African American: >89 mL/min
Sodium: 140 mEq/L (ref 135–145)

## 2013-08-02 LAB — BRAIN NATRIURETIC PEPTIDE: Brain Natriuretic Peptide: 39.5 pg/mL (ref 0.0–100.0)

## 2013-08-02 NOTE — Telephone Encounter (Signed)
Patient feels awful  Wants results of x ray and labs and if Selena Batten can call her in something to [facility] [address]

## 2013-08-03 NOTE — Telephone Encounter (Signed)
Result Notes    Notes Recorded by Piedad Climes, PA-C on 08/03/2013 at 7:09 AM Please inform patient that her chest x-ray and all of her labs look good. I think patient's symptoms may possibly be coming from worsening GERD. I would like for her to avoid eating late at night, avoid alcohol, continue prilosec as prescribed and add 2 TUMS at bedtime. Patient should avoid eating anything 1 hour prior to lying down. Also she should elevate the head of her bed (can place books under the bed post at head of bed). Follow-up in 2 weeks. If symptoms are still present, she may need referral to GI.    LMOM with contact name and number for return call RE: Results and further provider instructions/SLS

## 2013-08-04 NOTE — Telephone Encounter (Signed)
Patient returned your call please call [phone]

## 2013-08-05 NOTE — Telephone Encounter (Signed)
PATIENT CALLED AGAIN TODAY LOOKING FOR RESULTS

## 2013-08-08 NOTE — Telephone Encounter (Signed)
Patient informed, understood & agreed; f/u scheduled Mon, 12.29.14 at 9:15a w/PCP [per pt request]/SLS

## 2013-08-09 DIAGNOSIS — R05 Cough: Secondary | ICD-10-CM | POA: Insufficient documentation

## 2013-08-09 NOTE — Assessment & Plan Note (Signed)
We'll obtain labs and chest x-ray. Symptoms sound more consistent with worsening GERD. Encourage patient to avoid late-night eating. Take PPI as prescribed. Avoid alcohol, chocolate, peppermint and spicy foods. Take 2 TUMS before bedtime. Elevate head of bed. If symptoms not improving, will need referral to GI. Followup with PCP as needed.

## 2013-08-10 ENCOUNTER — Telehealth: Payer: Self-pay | Admitting: Family Medicine

## 2013-08-10 NOTE — Telephone Encounter (Signed)
Patient called in stating that she had "tweaked" her back again and wanted to know if Dr. Abner Greenspan would call in another rx of tramadol. I asked Bethann Berkshire, CMA about this and we did not see an rx for tramadol in patients medication history. Offered patient same day appointment but she declined, stating that she will wait to see Dr. Abner Greenspan

## 2013-08-15 ENCOUNTER — Encounter: Payer: Self-pay | Admitting: Family Medicine

## 2013-08-15 ENCOUNTER — Telehealth: Payer: Self-pay | Admitting: Family Medicine

## 2013-08-15 ENCOUNTER — Ambulatory Visit (INDEPENDENT_AMBULATORY_CARE_PROVIDER_SITE_OTHER): Payer: BC Managed Care – PPO | Admitting: Family Medicine

## 2013-08-15 VITALS — BP 138/72 | HR 76 | Temp 98.5°F | Ht 65.0 in | Wt 166.0 lb

## 2013-08-15 DIAGNOSIS — R1031 Right lower quadrant pain: Secondary | ICD-10-CM

## 2013-08-15 DIAGNOSIS — I10 Essential (primary) hypertension: Secondary | ICD-10-CM

## 2013-08-15 DIAGNOSIS — M545 Low back pain, unspecified: Secondary | ICD-10-CM

## 2013-08-15 DIAGNOSIS — T7840XA Allergy, unspecified, initial encounter: Secondary | ICD-10-CM

## 2013-08-15 DIAGNOSIS — R109 Unspecified abdominal pain: Secondary | ICD-10-CM

## 2013-08-15 DIAGNOSIS — R110 Nausea: Secondary | ICD-10-CM

## 2013-08-15 DIAGNOSIS — R197 Diarrhea, unspecified: Secondary | ICD-10-CM

## 2013-08-15 HISTORY — DX: Low back pain, unspecified: M54.50

## 2013-08-15 MED ORDER — CYCLOBENZAPRINE HCL 10 MG PO TABS: 10.0000 mg | ORAL_TABLET | Freq: Three times a day (TID) | ORAL | Status: DC | PRN

## 2013-08-15 MED ORDER — PROMETHAZINE HCL 25 MG PO TABS: 25.0000 mg | ORAL_TABLET | Freq: Three times a day (TID) | ORAL | Status: DC | PRN

## 2013-08-15 MED ORDER — CARVEDILOL 6.25 MG PO TABS: 6.2500 mg | ORAL_TABLET | Freq: Two times a day (BID) | ORAL | Status: DC

## 2013-08-15 MED ORDER — DILTIAZEM HCL ER COATED BEADS 180 MG PO CP24: 180.0000 mg | ORAL_CAPSULE | Freq: Every day | ORAL | Status: DC

## 2013-08-15 NOTE — Progress Notes (Signed)
Pre visit review using our clinic review tool, if applicable. No additional management support is needed unless otherwise documented below in the visit note. 

## 2013-08-15 NOTE — Progress Notes (Signed)
Patient ID: Megan [name], female   DOB: 1950-12-08, 62 y.o.   MRN: [id number] Megan [name] [id number] Dec 25, 1950 08/15/2013      Progress Note-Follow Up  Subjective  Chief Complaint  Chief Complaint  Patient presents with  . Follow-up    2 week    HPI  62 year old Caucasian female who is in today for followup. Reports her cough is improving but not gone. She has a persistent sense of postnasal drip and feels that he was very tenacious. Acknowledges severe dehydration. Denies fevers and chills. No thick green sputum. No chest pain or palpitations. No shortness of breath. She's complaining of some low back pain radiating slightly to the right hip. No radicular symptoms down the leg. Is having some urinary frequency and even note some incontinence at night but not during the day. Denies dysuria or hematuria. Complains that her diarrhea has become slightly more frequent and more smelling similar to CDiff in the past. No malaise or myalgias. No anorexia nausea vomiting.  Past Medical History  Diagnosis Date  . GERD (gastroesophageal reflux disease)   . Depression   . Anxiety   . Insomnia   . IBS (irritable bowel syndrome)   . Diverticulosis   . Barrett esophagus   . Hiatal hernia   . Unspecified menopausal and postmenopausal disorder   . Vitamin B12 deficiency   . Blind loop syndrome   . Hepatic cyst   . Unspecified hypothyroidism   . Diarrhea   . Family history of malignant neoplasm of gastrointestinal tract     multiple members  . Endometrial hyperplasia 02/27/2006    BENIGN ENDO BX ON 02/2007  . C. difficile diarrhea   . Osteoporosis 05/2011    -2.5 Lt femoral neck  . Cystocele, midline   . Rectocele   . Uterine prolapse without mention of vaginal wall prolapse   . Chest pain, atypical 07/14/2011  . Leaky heart valve   . Hiatal hernia   . 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 Thrush 07/21/2011  . Rectal bleeding 10/10/2011  . Sinusitis acute 02/03/2012  .  Contact dermatitis 03/23/2012  . Headache(784.0) 04/15/2012  . 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 Hematuria 04/27/2012  . Osteoporosis 09/07/2012  . Allergic state 12/31/2012  . 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 Paronychia of great toe, left 06/19/2013    Past Surgical History  Procedure Laterality Date  . Appendectomy    . Uterine fibroid surgery    . Hysteroscopy      POLYP    Family History  Problem Relation Age of Onset  . Colon cancer Mother   . Diabetes Mother   . Hypertension Mother   . Colon polyps Father   . Colon cancer Maternal Aunt   . Colon cancer Maternal Grandmother   . Breast cancer Maternal Grandmother   . Diabetes Paternal Grandmother   . Breast cancer Paternal Grandmother   . Colon cancer Cousin     X3  . Ovarian cancer Cousin     History   Social History  . Marital Status: Married    Spouse Name: N/A    Number of Children: 4  . Years of Education: N/A   Occupational History  . Retired    Social History Main Topics  . Smoking status: Former Smoker    Quit date: 08/19/1995  . Smokeless tobacco: Never Used  . Alcohol Use: No  . Drug Use: No  . Sexual Activity: No   Other Topics Concern  . Not on  file   Social History Narrative  . No narrative on file    Current Outpatient Prescriptions on File Prior to Visit  Medication Sig Dispense Refill  . albuterol (VENTOLIN HFA) 108 (90 BASE) MCG/ACT inhaler INHALE 2 PUFFS BY MOUTH EVERY 6 HOURS AS NEEDED  18 each  1  . Calcium Carbonate (CALCIUM 600 PO) Take 1,200 mg by mouth. Plus 1000 IU D3      . carvedilol (COREG) 6.25 MG tablet Take 1 tablet (6.25 mg total) by mouth 2 (two) times daily with a meal.  60 tablet  3  . diazepam (VALIUM) 10 MG tablet Take 1 tablet (10 mg total) by mouth every 12 (twelve) hours as needed for anxiety or sleep.  60 tablet  2  . diltiazem (CARDIZEM CD) 180 MG 24 hr capsule TAKE 1 CAPSULE (180 MG TOTAL) BY MOUTH DAILY.  90 capsule  0  . escitalopram (LEXAPRO) 20 MG tablet  TAKE 1 TABLET BY MOUTH DAILY  30 tablet  3  . fluticasone (FLONASE) 50 MCG/ACT nasal spray Place 2 sprays into the nose daily.  16 g  6  . HYDROcodone-acetaminophen (NORCO/VICODIN) 5-325 MG per tablet Take 1 tablet by mouth every 6 (six) hours as needed.  70 tablet  0  . hyoscyamine (LEVSIN SL) 0.125 MG SL tablet Place 1 tablet (0.125 mg total) under the tongue every 4 (four) hours as needed for cramping.  20 tablet  0  . lactobacillus acidophilus (BACID) TABS tablet Take 2 tablets by mouth 3 (three) times daily.      Marland Kitchen omeprazole (PRILOSEC) 40 MG capsule Take 40 mg by mouth daily.      . promethazine (PHENERGAN) 25 MG tablet Take 1 tablet (25 mg total) by mouth 3 (three) times daily as needed for nausea.  30 tablet  2  . vitamin C (ASCORBIC ACID) 500 MG tablet Take 500 mg by mouth 2 (two) times daily.      Marland Kitchen azithromycin (ZITHROMAX) 250 MG tablet 2 by mouth today, then 1 daily for 4 days  6 tablet  0   No current facility-administered medications on file prior to visit.    Allergies  Allergen Reactions  . Sulfamethoxazole Shortness Of Breath    chest tightness  . Iohexol   . Iodinated Diagnostic Agents     Pt states at Dr.Grapeys office 15 years ago blacked out for 2 hours during injection for IVP. Was told never to have IV dye again.     Review of Systems  Review of Systems  Constitutional: Negative for fever and malaise/fatigue.  HENT: Negative for congestion.   Eyes: Negative for discharge.  Respiratory: Negative for shortness of breath.   Cardiovascular: Negative for chest pain, palpitations and leg swelling.  Gastrointestinal: Negative for nausea, abdominal pain and diarrhea.  Genitourinary: Negative for dysuria.  Musculoskeletal: Negative for falls.  Skin: Negative for rash.  Neurological: Negative for loss of consciousness and headaches.  Endo/Heme/Allergies: Negative for polydipsia.  Psychiatric/Behavioral: Negative for depression and suicidal ideas. The patient is not  nervous/anxious and does not have insomnia.     Objective  BP 138/72  Pulse 76  Temp(Src) 98.5 F (36.9 C) (Oral)  Ht 5\' 5"  (1.651 m)  Wt 166 lb 0.6 oz (75.315 kg)  BMI 27.63 kg/m2  SpO2 96%  Physical Exam  Physical Exam  Constitutional: She is oriented to person, place, and time and well-developed, well-nourished, and in no distress. No distress.  HENT:  Head: Normocephalic and atraumatic.  Eyes: Conjunctivae are normal.  Neck: Neck supple. No thyromegaly present.  Cardiovascular: Normal rate, regular rhythm and normal heart sounds.   No murmur heard. Pulmonary/Chest: Effort normal and breath sounds normal. She has no wheezes.  Abdominal: She exhibits no distension and no mass.  Musculoskeletal: She exhibits no edema.  Lymphadenopathy:    She has no cervical adenopathy.  Neurological: She is alert and oriented to person, place, and time.  Skin: Skin is warm and dry. No rash noted. She is not diaphoretic.  Psychiatric: Memory, affect and judgment normal.    Lab Results  Component Value Date   TSH 1.314 02/17/2013   Lab Results  Component Value Date   WBC 9.2 08/01/2013   HGB 13.8 08/01/2013   HCT 41.2 08/01/2013   MCV 92.4 08/01/2013   PLT 382 08/01/2013   Lab Results  Component Value Date   CREATININE 0.69 08/01/2013   BUN 8 08/01/2013   NA 140 08/01/2013   K 4.1 08/01/2013   CL 103 08/01/2013   CO2 25 08/01/2013   Lab Results  Component Value Date   ALT 17 11/19/2012   AST 15 11/19/2012   ALKPHOS 136* 11/19/2012   BILITOT 0.3 11/19/2012   Lab Results  Component Value Date   CHOL 218* 06/30/2011   Lab Results  Component Value Date   HDL 55.80 06/30/2011   Lab Results  Component Value Date   LDLCALC 112 11/21/2010   Lab Results  Component Value Date   TRIG 80.0 06/30/2011   Lab Results  Component Value Date   CHOLHDL 4 06/30/2011     Assessment & Plan  HYPERTENSION Well controlled on current meds. No changes  Allergic state PND and cough  persistent but improved. Continue Flonase and increase hydration  Low back pain Has used Flexeril with good results in past. Given a refill to try encouraged encouraged topical treatments as well.  DIARRHEA, CHRONIC Patient reports increased frequency and smell similar to when she had CDiff in past, will check cbc and CDiff by PCR today. Encouraged probiotics and bland diet, increased hydration  Abdominal pain RLQ mild increase will check CBC and UA and patient will report worsening symptoms

## 2013-08-15 NOTE — Assessment & Plan Note (Signed)
Patient reports increased frequency and smell similar to when she had CDiff in past, will check cbc and CDiff by PCR today. Encouraged probiotics and bland diet, increased hydration

## 2013-08-15 NOTE — Telephone Encounter (Signed)
I sent it to pharmacy during her appt, sent #[phone] with 1 refill to [facility]

## 2013-08-15 NOTE — Patient Instructions (Signed)
Salon Pas patches as neededeBack Pain, Adult Back pain is very common. The pain often gets better over time. The cause of back pain is usually not dangerous. Most people can learn to manage their back pain on their own.  HOME CARE   Stay active. Start with short walks on flat ground if you can. Try to walk farther each day.  Do not sit, drive, or stand in one place for more than 30 minutes. Do not stay in bed.  Do not avoid exercise or work. Activity can help your back heal faster.  Be careful when you bend or lift an object. Bend at your knees, keep the object close to you, and do not twist.  Sleep on a firm mattress. Lie on your side, and bend your knees. If you lie on your back, put a pillow under your knees.  Only take medicines as told by your doctor.  Put ice on the injured area.  Put ice in a plastic bag.  Place a towel between your skin and the bag.  Leave the ice on for 15-20 minutes, 03-04 times a day for the first 2 to 3 days. After that, you can switch between ice and heat packs.  Ask your doctor about back exercises or massage.  Avoid feeling anxious or stressed. Find good ways to deal with stress, such as exercise. GET HELP RIGHT AWAY IF:   Your pain does not go away with rest or medicine.  Your pain does not go away in 1 week.  You have new problems.  You do not feel well.  The pain spreads into your legs.  You cannot control when you poop (bowel movement) or pee (urinate).  Your arms or legs feel weak or lose feeling (numbness).  You feel sick to your stomach (nauseous) or throw up (vomit).  You have belly (abdominal) pain.  You feel like you may pass out (faint). MAKE SURE YOU:   Understand these instructions.  Will watch your condition.  Will get help right away if you are not doing well or get worse. Document Released: 01/21/2008 Document Revised: 10/27/2011 Document Reviewed: 12/23/2010 [facility] Patient Information 2014 [place], [place].

## 2013-08-15 NOTE — Assessment & Plan Note (Signed)
RLQ mild increase will check CBC and UA and patient will report worsening symptoms

## 2013-08-15 NOTE — Assessment & Plan Note (Signed)
Well controlled on current meds. No changes

## 2013-08-15 NOTE — Telephone Encounter (Signed)
Was this given to the patient?

## 2013-08-15 NOTE — Assessment & Plan Note (Signed)
PND and cough persistent but improved. Continue Flonase and increase hydration

## 2013-08-15 NOTE — Assessment & Plan Note (Signed)
Has used Flexeril with good results in past. Given a refill to try encouraged encouraged topical treatments as well.

## 2013-08-15 NOTE — Telephone Encounter (Signed)
See note

## 2013-08-15 NOTE — Telephone Encounter (Signed)
Was in today.  Dr b said she would send in flexiril for her back.  The pharmacy says thety have not received anything

## 2013-08-16 ENCOUNTER — Telehealth: Payer: Self-pay

## 2013-08-16 DIAGNOSIS — R51 Headache: Secondary | ICD-10-CM

## 2013-08-16 LAB — URINALYSIS
Glucose, UA: NEGATIVE mg/dL
Hgb urine dipstick: NEGATIVE
Protein, ur: NEGATIVE mg/dL
Urobilinogen, UA: 0.2 mg/dL (ref 0.0–1.0)

## 2013-08-16 MED ORDER — HYDROCODONE-ACETAMINOPHEN 5-325 MG PO TABS: 1.0000 | ORAL_TABLET | Freq: Four times a day (QID) | ORAL | Status: DC | PRN

## 2013-08-16 NOTE — Telephone Encounter (Signed)
Patient informed. 

## 2013-08-16 NOTE — Telephone Encounter (Signed)
Pt left a message stating she needs her Hydrocodone refilled?  Last RX was done on 07-07-13 quantity 70 with 0 refills  RX printed and given to MD to sign  Left message for pt to return my call.   RX is ready and pt needs to bring ID

## 2013-08-17 LAB — URINE CULTURE
Colony Count: NO GROWTH
Organism ID, Bacteria: NO GROWTH

## 2013-08-31 ENCOUNTER — Other Ambulatory Visit: Payer: Self-pay | Admitting: Family Medicine

## 2013-09-15 ENCOUNTER — Ambulatory Visit: Payer: BC Managed Care – PPO | Admitting: Family Medicine

## 2013-10-07 ENCOUNTER — Ambulatory Visit: Payer: BC Managed Care – PPO | Admitting: Gynecology

## 2013-10-08 ENCOUNTER — Emergency Department (HOSPITAL_BASED_OUTPATIENT_CLINIC_OR_DEPARTMENT_OTHER)
Admission: EM | Admit: 2013-10-08 | Discharge: 2013-10-08 | Discharge status: Against Medical Advice | Payer: BC Managed Care – PPO | Attending: Emergency Medicine | Admitting: Emergency Medicine

## 2013-10-08 ENCOUNTER — Encounter (HOSPITAL_BASED_OUTPATIENT_CLINIC_OR_DEPARTMENT_OTHER): Payer: Self-pay | Admitting: Emergency Medicine

## 2013-10-08 DIAGNOSIS — R110 Nausea: Secondary | ICD-10-CM | POA: Insufficient documentation

## 2013-10-08 DIAGNOSIS — R1031 Right lower quadrant pain: Secondary | ICD-10-CM | POA: Insufficient documentation

## 2013-10-08 DIAGNOSIS — Z87891 Personal history of nicotine dependence: Secondary | ICD-10-CM | POA: Insufficient documentation

## 2013-10-08 DIAGNOSIS — Z5321 Procedure and treatment not carried out due to patient leaving prior to being seen by health care provider: Secondary | ICD-10-CM

## 2013-10-08 LAB — URINE MICROSCOPIC-ADD ON

## 2013-10-08 LAB — URINALYSIS, ROUTINE W REFLEX MICROSCOPIC
BILIRUBIN URINE: NEGATIVE
Glucose, UA: NEGATIVE mg/dL
Hgb urine dipstick: NEGATIVE
Ketones, ur: NEGATIVE mg/dL
Nitrite: NEGATIVE
Protein, ur: NEGATIVE mg/dL
SPECIFIC GRAVITY, URINE: 1.013 (ref 1.005–1.030)
UROBILINOGEN UA: 0.2 mg/dL (ref 0.0–1.0)
pH: 7.5 (ref 5.0–8.0)

## 2013-10-08 NOTE — ED Notes (Signed)
Family informed RN that they were leaving and RN requested that patient wait and she would speak with MD, but patient left with signing AMA

## 2013-10-08 NOTE — ED Notes (Signed)
Patient here with right flank pain and right lower abdominal pain x 1 week, reports that it is a sharp pain with nausea

## 2013-10-08 NOTE — ED Provider Notes (Signed)
Left without being seen. I did not see or examine the patient.   Clinical Impression 1. Patient left without being seen      Blanchard Kelch, MD 10/08/13 [id number]

## 2013-10-09 ENCOUNTER — Emergency Department (HOSPITAL_COMMUNITY)
Admission: EM | Admit: 2013-10-09 | Discharge: 2013-10-09 | Disposition: A | Discharge status: Home / Self Care | Payer: BC Managed Care – PPO | Attending: Emergency Medicine | Admitting: Emergency Medicine

## 2013-10-09 ENCOUNTER — Encounter (HOSPITAL_COMMUNITY): Payer: Self-pay | Admitting: Emergency Medicine

## 2013-10-09 ENCOUNTER — Emergency Department (HOSPITAL_COMMUNITY): Payer: BC Managed Care – PPO

## 2013-10-09 DIAGNOSIS — F3289 Other specified depressive episodes: Secondary | ICD-10-CM | POA: Insufficient documentation

## 2013-10-09 DIAGNOSIS — Z862 Personal history of diseases of the blood and blood-forming organs and certain disorders involving the immune mechanism: Secondary | ICD-10-CM | POA: Insufficient documentation

## 2013-10-09 DIAGNOSIS — R109 Unspecified abdominal pain: Secondary | ICD-10-CM | POA: Insufficient documentation

## 2013-10-09 DIAGNOSIS — Z8742 Personal history of other diseases of the female genital tract: Secondary | ICD-10-CM | POA: Insufficient documentation

## 2013-10-09 DIAGNOSIS — F411 Generalized anxiety disorder: Secondary | ICD-10-CM | POA: Insufficient documentation

## 2013-10-09 DIAGNOSIS — Z8639 Personal history of other endocrine, nutritional and metabolic disease: Secondary | ICD-10-CM | POA: Insufficient documentation

## 2013-10-09 DIAGNOSIS — M810 Age-related osteoporosis without current pathological fracture: Secondary | ICD-10-CM | POA: Insufficient documentation

## 2013-10-09 DIAGNOSIS — Z87448 Personal history of other diseases of urinary system: Secondary | ICD-10-CM | POA: Insufficient documentation

## 2013-10-09 DIAGNOSIS — Z79899 Other long term (current) drug therapy: Secondary | ICD-10-CM | POA: Insufficient documentation

## 2013-10-09 DIAGNOSIS — Z872 Personal history of diseases of the skin and subcutaneous tissue: Secondary | ICD-10-CM | POA: Insufficient documentation

## 2013-10-09 DIAGNOSIS — Z87891 Personal history of nicotine dependence: Secondary | ICD-10-CM | POA: Insufficient documentation

## 2013-10-09 DIAGNOSIS — K219 Gastro-esophageal reflux disease without esophagitis: Secondary | ICD-10-CM | POA: Insufficient documentation

## 2013-10-09 DIAGNOSIS — F329 Major depressive disorder, single episode, unspecified: Secondary | ICD-10-CM | POA: Insufficient documentation

## 2013-10-09 DIAGNOSIS — Z8619 Personal history of other infectious and parasitic diseases: Secondary | ICD-10-CM | POA: Insufficient documentation

## 2013-10-09 DIAGNOSIS — IMO0002 Reserved for concepts with insufficient information to code with codable children: Secondary | ICD-10-CM | POA: Insufficient documentation

## 2013-10-09 DIAGNOSIS — K589 Irritable bowel syndrome without diarrhea: Secondary | ICD-10-CM | POA: Insufficient documentation

## 2013-10-09 DIAGNOSIS — Z9089 Acquired absence of other organs: Secondary | ICD-10-CM | POA: Insufficient documentation

## 2013-10-09 DIAGNOSIS — M549 Dorsalgia, unspecified: Secondary | ICD-10-CM | POA: Insufficient documentation

## 2013-10-09 LAB — URINALYSIS, ROUTINE W REFLEX MICROSCOPIC
BILIRUBIN URINE: NEGATIVE
Glucose, UA: NEGATIVE mg/dL
Hgb urine dipstick: NEGATIVE
Ketones, ur: NEGATIVE mg/dL
NITRITE: NEGATIVE
PH: 7 (ref 5.0–8.0)
Protein, ur: NEGATIVE mg/dL
SPECIFIC GRAVITY, URINE: 1.004 — AB (ref 1.005–1.030)
UROBILINOGEN UA: 0.2 mg/dL (ref 0.0–1.0)

## 2013-10-09 LAB — URINE MICROSCOPIC-ADD ON

## 2013-10-09 MED ORDER — HYDROMORPHONE HCL PF 1 MG/ML IJ SOLN
1.0000 mg | Freq: Once | INTRAMUSCULAR | Status: AC
  Administered 2013-10-09: 1 mg via INTRAMUSCULAR
  Filled 2013-10-09: qty 1

## 2013-10-09 MED ORDER — METHOCARBAMOL 500 MG PO TABS: 1000.0000 mg | ORAL_TABLET | Freq: Three times a day (TID) | ORAL | Status: DC | PRN

## 2013-10-09 NOTE — ED Notes (Addendum)
Pt c/o right lower abdominal pain that radiates around to right back area onset last weekend. Pt went to [facility] yesterday but left. Pt reports nausea. Pt took Vicodin and phenergan at 0815 today.

## 2013-10-09 NOTE — ED Notes (Signed)
Pt discharged.Vital signs stable and GCS 15 

## 2013-10-09 NOTE — Discharge Instructions (Signed)
You may try robaxin as need for muscle spasm/pain. You may also take your pain medication as need. Follow up with primary care doctor in 1 week if symptoms fail to improve/resolve. Return to ER if worse, new symptoms, fevers, persistent vomiting, other concern.  You were given pain medication in the ER - no driving for the next 6 hours.      Abdominal Pain, Women Abdominal (stomach, pelvic, or belly) pain can be caused by many things. It is important to tell your doctor:  The location of the pain.  Does it come and go or is it present all the time?  Are there things that start the pain (eating certain foods, exercise)?  Are there other symptoms associated with the pain (fever, nausea, vomiting, diarrhea)? All of this is helpful to know when trying to find the cause of the pain. CAUSES   Stomach: virus or bacteria infection, or ulcer.  Intestine: appendicitis (inflamed appendix), regional ileitis (Crohn's disease), ulcerative colitis (inflamed colon), irritable bowel syndrome, diverticulitis (inflamed diverticulum of the colon), or cancer of the stomach or intestine.  Gallbladder disease or stones in the gallbladder.  Kidney disease, kidney stones, or infection.  Pancreas infection or cancer.  Fibromyalgia (pain disorder).  Diseases of the female organs:  Uterus: fibroid (non-cancerous) tumors or infection.  Fallopian tubes: infection or tubal pregnancy.  Ovary: cysts or tumors.  Pelvic adhesions (scar tissue).  Endometriosis (uterus lining tissue growing in the pelvis and on the pelvic organs).  Pelvic congestion syndrome (female organs filling up with blood just before the menstrual period).  Pain with the menstrual period.  Pain with ovulation (producing an egg).  Pain with an IUD (intrauterine device, birth control) in the uterus.  Cancer of the female organs.  Functional pain (pain not caused by a disease, may improve without treatment).  Psychological  pain.  Depression. DIAGNOSIS  Your doctor will decide the seriousness of your pain by doing an examination.  Blood tests.  X-rays.  Ultrasound.  CT scan (computed tomography, special type of X-ray).  MRI (magnetic resonance imaging).  Cultures, for infection.  Barium enema (dye inserted in the large intestine, to better view it with X-rays).  Colonoscopy (looking in intestine with a lighted tube).  Laparoscopy (minor surgery, looking in abdomen with a lighted tube).  Major abdominal exploratory surgery (looking in abdomen with a large incision). TREATMENT  The treatment will depend on the cause of the pain.   Many cases can be observed and treated at home.  Over-the-counter medicines recommended by your caregiver.  Prescription medicine.  Antibiotics, for infection.  Birth control pills, for painful periods or for ovulation pain.  Hormone treatment, for endometriosis.  Nerve blocking injections.  Physical therapy.  Antidepressants.  Counseling with a psychologist or psychiatrist.  Minor or major surgery. HOME CARE INSTRUCTIONS   Do not take laxatives, unless directed by your caregiver.  Take over-the-counter pain medicine only if ordered by your caregiver. Do not take aspirin because it can cause an upset stomach or bleeding.  Try a clear liquid diet (broth or water) as ordered by your caregiver. Slowly move to a bland diet, as tolerated, if the pain is related to the stomach or intestine.  Have a thermometer and take your temperature several times a day, and record it.  Bed rest and sleep, if it helps the pain.  Avoid sexual intercourse, if it causes pain.  Avoid stressful situations.  Keep your follow-up appointments and tests, as your caregiver orders.  If the pain does not go away with medicine or surgery, you may try:  Acupuncture.  Relaxation exercises (yoga, meditation).  Group therapy.  Counseling. SEEK MEDICAL CARE IF:   You  notice certain foods cause stomach pain.  Your home care treatment is not helping your pain.  You need stronger pain medicine.  You want your IUD removed.  You feel faint or lightheaded.  You develop nausea and vomiting.  You develop a rash.  You are having side effects or an allergy to your medicine. SEEK IMMEDIATE MEDICAL CARE IF:   Your pain does not go away or gets worse.  You have a fever.  Your pain is felt only in portions of the abdomen. The right side could possibly be appendicitis. The left lower portion of the abdomen could be colitis or diverticulitis.  You are passing blood in your stools (bright red or black tarry stools, with or without vomiting).  You have blood in your urine.  You develop chills, with or without a fever.  You pass out. MAKE SURE YOU:   Understand these instructions.  Will watch your condition.  Will get help right away if you are not doing well or get worse. Document Released: 06/01/2007 Document Revised: 10/27/2011 Document Reviewed: 06/21/2009 [facility] Patient Information 2014 [place], [place].    Flank Pain Flank pain refers to pain that is located on the side of the body between the upper abdomen and the back. The pain may occur over a short period of time (acute) or may be long-term or reoccurring (chronic). It may be mild or severe. Flank pain can be caused by many things. CAUSES  Some of the more common causes of flank pain include:  Muscle strains.   Muscle spasms.   A disease of your spine (vertebral disk disease).   A lung infection (pneumonia).   Fluid around your lungs (pulmonary edema).   A kidney infection.   Kidney stones.   A very painful skin rash caused by the chickenpox virus (shingles).   Gallbladder disease.  [place] care will depend on the cause of your pain. In general,  Rest as directed by your caregiver.  Drink enough fluids to keep your urine clear or pale  yellow.  Only take over-the-counter or prescription medicines as directed by your caregiver. Some medicines may help relieve the pain.  Tell your caregiver about any changes in your pain.  Follow up with your caregiver as directed. SEEK IMMEDIATE MEDICAL CARE IF:   Your pain is not controlled with medicine.   You have new or worsening symptoms.  Your pain increases.   You have abdominal pain.   You have shortness of breath.   You have persistent nausea or vomiting.   You have swelling in your abdomen.   You feel faint or pass out.   You have blood in your urine.  You have a fever or persistent symptoms for more than 2 3 days.  You have a fever and your symptoms suddenly get worse. MAKE SURE YOU:   Understand these instructions.  Will watch your condition.  Will get help right away if you are not doing well or get worse. Document Released: 09/25/2005 Document Revised: 04/28/2012 Document Reviewed: 03/18/2012 [facility] Patient Information 2014 [place].

## 2013-10-09 NOTE — ED Provider Notes (Signed)
CSN: [id number]     Arrival date & time 10/09/13  1053 History   First MD Initiated Contact with Patient 10/09/13 1114     Chief Complaint  Patient presents with  . Abdominal Pain  . Back Pain     (Consider location/radiation/quality/duration/timing/severity/associated sxs/prior Treatment) Patient is a 63 y.o. female presenting with abdominal pain and back pain. The history is provided by the patient and the spouse.  Abdominal Pain Associated symptoms: no chest pain, no chills, no constipation, no cough, no diarrhea, no dysuria, no fever, no hematuria, no shortness of breath, no sore throat, no vaginal bleeding, no vaginal discharge and no vomiting   Back Pain Associated symptoms: abdominal pain   Associated symptoms: no chest pain, no dysuria, no fever and no headaches   pt c/o right flank pain, esp posteriorly in the past few days. Constant, dull, non radiating. Waxes and wanes in intensity. Denies dysuria or hematuria. No nvd. Having normal bms. No vaginal discharge or bleeding. Denies side or back strain, fall or injury. No hx kidney stones. States only prior abd surgery was remote hx appendectomy. No hx gallstones. Denies fever or chills.      Past Medical History  Diagnosis Date  . GERD (gastroesophageal reflux disease)   . Depression   . Anxiety   . Insomnia   . IBS (irritable bowel syndrome)   . Diverticulosis   . Barrett esophagus   . Hiatal hernia   . Unspecified menopausal and postmenopausal disorder   . Vitamin B12 deficiency   . Blind loop syndrome   . Hepatic cyst   . Unspecified hypothyroidism   . Diarrhea   . Family history of malignant neoplasm of gastrointestinal tract     multiple members  . Endometrial hyperplasia 02/27/2006    BENIGN ENDO BX ON 02/2007  . C. difficile diarrhea   . Osteoporosis 05/2011    -2.5 Lt femoral neck  . Cystocele, midline   . Rectocele   . Uterine prolapse without mention of vaginal wall prolapse   . Chest pain, atypical  07/14/2011  . Leaky heart valve   . Hiatal hernia   . 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 Thrush 07/21/2011  . Rectal bleeding 10/10/2011  . Sinusitis acute 02/03/2012  . Contact dermatitis 03/23/2012  . Headache(784.0) 04/15/2012  . 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 Hematuria 04/27/2012  . Osteoporosis 09/07/2012  . Allergic state 12/31/2012  . 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 Paronychia of great toe, left 06/19/2013  . Low back pain 08/15/2013   Past Surgical History  Procedure Laterality Date  . Appendectomy    . Uterine fibroid surgery    . Hysteroscopy      POLYP   Family History  Problem Relation Age of Onset  . Colon cancer Mother   . Diabetes Mother   . Hypertension Mother   . Colon polyps Father   . Colon cancer Maternal Aunt   . Colon cancer Maternal Grandmother   . Breast cancer Maternal Grandmother   . Diabetes Paternal Grandmother   . Breast cancer Paternal Grandmother   . Colon cancer Cousin     X3  . Ovarian cancer Cousin    History  Substance Use Topics  . Smoking status: Former Smoker    Quit date: 08/19/1995  . Smokeless tobacco: Never Used  . Alcohol Use: No   OB History   Grav Para Term Preterm Abortions TAB SAB Ect Mult Living   6 4   2  4     Review of Systems  Constitutional: Negative for fever and chills.  HENT: Negative for sore throat.   Eyes: Negative for redness.  Respiratory: Negative for cough and shortness of breath.   Cardiovascular: Negative for chest pain and leg swelling.  Gastrointestinal: Positive for abdominal pain. Negative for vomiting, diarrhea, constipation and blood in stool.  Genitourinary: Negative for dysuria, hematuria, flank pain, vaginal bleeding and vaginal discharge.  Musculoskeletal: Positive for back pain. Negative for neck pain.  Skin: Negative for rash.  Neurological: Negative for headaches.  Hematological: Does not bruise/bleed easily.  Psychiatric/Behavioral: Negative for confusion.      Allergies   Sulfamethoxazole; Iohexol; and Iodinated diagnostic agents  Home Medications   Current Outpatient Rx  Name  Route  Sig  Dispense  Refill  . albuterol (VENTOLIN HFA) 108 (90 BASE) MCG/ACT inhaler      INHALE 2 PUFFS BY MOUTH EVERY 6 HOURS AS NEEDED   18 each   1   . azithromycin (ZITHROMAX) 250 MG tablet      2 by mouth today, then 1 daily for 4 days   6 tablet   0   . Calcium Carbonate (CALCIUM 600 PO)   Oral   Take 1,200 mg by mouth. Plus 1000 IU D3         . carvedilol (COREG) 6.25 MG tablet   Oral   Take 1 tablet (6.25 mg total) by mouth 2 (two) times daily with a meal.   180 tablet   3   . cyclobenzaprine (FLEXERIL) 10 MG tablet   Oral   Take 1 tablet (10 mg total) by mouth 3 (three) times daily as needed for muscle spasms.   40 tablet   1   . diazepam (VALIUM) 10 MG tablet   Oral   Take 1 tablet (10 mg total) by mouth every 12 (twelve) hours as needed for anxiety or sleep.   60 tablet   2   . diltiazem (CARDIZEM CD) 180 MG 24 hr capsule   Oral   Take 1 capsule (180 mg total) by mouth daily.   90 capsule   3   . escitalopram (LEXAPRO) 20 MG tablet      TAKE 1 TABLET BY MOUTH DAILY   30 tablet   3   . fluticasone (FLONASE) 50 MCG/ACT nasal spray   Nasal   Place 2 sprays into the nose daily.   16 g   6   . HYDROcodone-acetaminophen (NORCO/VICODIN) 5-325 MG per tablet   Oral   Take 1 tablet by mouth every 6 (six) hours as needed.   70 tablet   0   . hyoscyamine (LEVSIN SL) 0.125 MG SL tablet   Sublingual   Place 1 tablet (0.125 mg total) under the tongue every 4 (four) hours as needed for cramping.   20 tablet   0   . lactobacillus acidophilus (BACID) TABS tablet   Oral   Take 2 tablets by mouth 3 (three) times daily.         . montelukast (SINGULAIR) 10 MG tablet      TAKE 1 TABLET BY MOUTH AT BEDTIME   30 tablet   2   . omeprazole (PRILOSEC) 40 MG capsule   Oral   Take 40 mg by mouth daily.         . promethazine  (PHENERGAN) 25 MG tablet   Oral   Take 1 tablet (25 mg total) by mouth 3 (three) times daily  as needed for nausea.   30 tablet   2   . vitamin C (ASCORBIC ACID) 500 MG tablet   Oral   Take 500 mg by mouth 2 (two) times daily.          BP 177/93  Pulse 87  Temp(Src) 97.2 F (36.2 C) (Oral)  Resp 17  Wt 160 lb (72.576 kg)  SpO2 97% Physical Exam  Nursing note and vitals reviewed. Constitutional: She appears well-developed and well-nourished. No distress.  HENT:  Mouth/Throat: Oropharynx is clear and moist.  Eyes: Conjunctivae are normal. No scleral icterus.  Neck: Neck supple. No tracheal deviation present.  Cardiovascular: Normal rate, regular rhythm, normal heart sounds and intact distal pulses.   Pulmonary/Chest: Effort normal and breath sounds normal. No respiratory distress.  Abdominal: Soft. Normal appearance and bowel sounds are normal. She exhibits no distension and no mass. There is no tenderness. There is no rebound and no guarding.  No hernia.   Genitourinary:  No cva tenderness  Musculoskeletal: She exhibits no edema.  tls spine non tender.   Neurological: She is alert.  Skin: Skin is warm and dry. No rash noted. She is not diaphoretic.  No shingles/rash to area of pain   Psychiatric: She has a normal mood and affect.    ED Course  Procedures (including critical care time)  Results for orders placed during the hospital encounter of 10/09/13  URINALYSIS, ROUTINE W REFLEX MICROSCOPIC      Result Value Ref Range   Color, Urine YELLOW  YELLOW   APPearance CLEAR  CLEAR   Specific Gravity, Urine 1.004 (*) 1.005 - 1.030   pH 7.0  5.0 - 8.0   Glucose, UA NEGATIVE  NEGATIVE mg/dL   Hgb urine dipstick NEGATIVE  NEGATIVE   Bilirubin Urine NEGATIVE  NEGATIVE   Ketones, ur NEGATIVE  NEGATIVE mg/dL   Protein, ur NEGATIVE  NEGATIVE mg/dL   Urobilinogen, UA 0.2  0.0 - 1.0 mg/dL   Nitrite NEGATIVE  NEGATIVE   Leukocytes, UA SMALL (*) NEGATIVE  URINE MICROSCOPIC-ADD  ON      Result Value Ref Range   Squamous Epithelial / LPF RARE  RARE   WBC, UA 0-2  <3 WBC/hpf   RBC / HPF 0-2  <3 RBC/hpf   Bacteria, UA RARE  RARE   Ct Abdomen Pelvis Wo Contrast  10/09/2013   CLINICAL DATA:  Right lower abdominal pain  EXAM: CT ABDOMEN AND PELVIS WITHOUT CONTRAST  TECHNIQUE: Multidetector CT imaging of the abdomen and pelvis was performed following the standard protocol without intravenous contrast.  COMPARISON:  10/03/2011  FINDINGS: There is no pleural effusion.  The lung bases appear clear.  1.5 cm low attenuation structure is identified within the left hepatic lobe compatible with a cyst. The gallbladder is normal. No biliary dilatation. Normal appearance of the pancreas. The spleen is unremarkable.  The right adrenal gland is normal. Stable left adrenal adenoma measuring 1.3 cm, image number 20/series 5. The right kidney appears within normal limits. No right-sided nephrolithiasis or hydronephrosis. The left kidney also appears normal. No left-sided nephrolithiasis or hydronephrosis. The urinary bladder appears normal. The uterus and adnexal structures are both unremarkable.  The abdominal aorta has a normal caliber. There is mild calcified atherosclerotic disease without evidence for aneurysm. No upper abdominal adenopathy identified. No pelvic or inguinal adenopathy identified. The stomach appears normal. The small bowel loops are within normal limits. Normal appearance of the colon.  There is no free fluid or abnormal  fluid collections identified within the abdomen or the pelvis. Review of the visualized bony structures is negative for aggressive lytic or sclerotic bone lesion.  IMPRESSION: 1. No acute findings within the abdomen or pelvis. 2. Liver cyst and left adrenal adenoma.  Stable from prior exam.   Electronically Signed   By: Kerby Moors M.D.   On: 10/09/2013 12:09     MDM  Dilaudid 1 mg im.  Ua. Ct.  Reviewed nursing notes and prior charts for additional  history.   Recheck pt comfortable.   Ct neg acute. abd soft nt.  Pt appears stable for d/c.     Mirna Mires, MD 10/09/13 1235

## 2013-10-17 ENCOUNTER — Telehealth: Payer: Self-pay | Admitting: Family Medicine

## 2013-10-17 DIAGNOSIS — R51 Headache: Secondary | ICD-10-CM

## 2013-10-17 NOTE — Telephone Encounter (Signed)
OK to refill Hydrocodone same sig, same strength, #70

## 2013-10-17 NOTE — Telephone Encounter (Signed)
Hydrocodone refill. 

## 2013-10-17 NOTE — Telephone Encounter (Signed)
Please advise refill?  Last RX was done on 08-16-13 quantity 70 with 0 refills

## 2013-10-18 MED ORDER — HYDROCODONE-ACETAMINOPHEN 5-325 MG PO TABS: 1.0000 | ORAL_TABLET | Freq: Four times a day (QID) | ORAL | Status: DC | PRN

## 2013-10-18 NOTE — Telephone Encounter (Signed)
RX printed and pt informed

## 2013-10-19 ENCOUNTER — Encounter: Payer: Self-pay | Admitting: Gynecology

## 2013-10-19 ENCOUNTER — Ambulatory Visit (INDEPENDENT_AMBULATORY_CARE_PROVIDER_SITE_OTHER): Payer: BC Managed Care – PPO | Admitting: Gynecology

## 2013-10-19 DIAGNOSIS — R109 Unspecified abdominal pain: Secondary | ICD-10-CM

## 2013-10-19 NOTE — Patient Instructions (Signed)
Followup with gastroenterologist appointment as scheduled. Followup both with the gastroenterologist and to her primary doctor in reference to the CT scan mentioning the left adrenal tumor.

## 2013-10-19 NOTE — Progress Notes (Signed)
Megan [name] 14-Dec-1950 [id number]        63 y.o.  [id number] complaining of 3 weeks of right sided abdominal pain. Occurred relatively acutely while in bed and turning over. Initially localized to the right lower quadrant but now seems more diffuse throughout the abdomen. Also notes bloating and nausea. Was evaluated in the emergency room 10/09/2013 where a CT scan was performed and was normal. They did note a left adrenal adenoma 1.3 cm and stated it was stable from prior CT. Also noted uterus and adnexa were negative. No evidence of peritoneal fluid or ascites. No evidence of adenopathy. Was told that they thought was due to muscle spasm and was given muscle relaxants but her pain continues. Does have a history of diverticulitis, GERD, Barrett's esophagus with hiatal hernia. Does have appointment with her gastroenterologist next week. Wanted to see me just to make sure he did not think it was GYN related. She did have a GYN ultrasound in April 2014 for left lower cautery discomfort which was normal showing atrophic-appearing ovaries. She is status post appendectomy in the past.  Past medical history,surgical history, problem list, medications, allergies, family history and social history were all reviewed and documented in the EPIC chart.  Exam: Kim assistant General appearance  Normal Abdomen soft with generalized tenderness to deep palpation. No acute changes, guarding, rebound organomegaly. Active bowel sounds throughout. Pelvic external BUS vagina with atrophic changes. Cervix atrophic and normal. Uterus grossly normal size midline mobile nontender. Adnexa without masses or tenderness. Rectovaginal exam is normal.  Assessment/Plan:  63 y.o. [id number] 3 week history of initial right lower quadrant pain now more diffuse with nausea and bloating. History of multiple GI issues in the past. CT scan shows no evidence of pelvic pathology. Her pelvic exam is normal. Do not feel any further GYN  evaluation is necessary at this time but recommend she followup with her gastroenterologist.  I did review the reported adrenal adenoma. I reviewed her 2013 CT scan report but there was no discussion of the adenoma. Her 2015 CT scan said it was stable from prior exam. I've asked her and stressed the importance for her to discuss this with her gastroenterologist and her primary physician who she has an appointment in 2 weeks and followup per their recommendation if any further evaluation is necessary.   Note: This document was prepared with digital dictation and possible smart phrase technology. Any transcriptional errors that result from this process are unintentional.   Anastasio Auerbach MD, 9:38 AM 10/19/2013

## 2013-10-27 ENCOUNTER — Encounter: Payer: Self-pay | Admitting: Family Medicine

## 2013-10-27 ENCOUNTER — Ambulatory Visit (INDEPENDENT_AMBULATORY_CARE_PROVIDER_SITE_OTHER): Payer: BC Managed Care – PPO | Admitting: Family Medicine

## 2013-10-27 VITALS — Ht 65.0 in

## 2013-10-27 DIAGNOSIS — R109 Unspecified abdominal pain: Secondary | ICD-10-CM

## 2013-10-27 DIAGNOSIS — N8500 Endometrial hyperplasia, unspecified: Secondary | ICD-10-CM

## 2013-10-27 DIAGNOSIS — R748 Abnormal levels of other serum enzymes: Secondary | ICD-10-CM

## 2013-10-27 DIAGNOSIS — I10 Essential (primary) hypertension: Secondary | ICD-10-CM

## 2013-10-27 DIAGNOSIS — K219 Gastro-esophageal reflux disease without esophagitis: Secondary | ICD-10-CM

## 2013-10-27 LAB — CBC
HCT: 39.5 % (ref 36.0–46.0)
Hemoglobin: 13.5 g/dL (ref 12.0–15.0)
MCH: 30.8 pg (ref 26.0–34.0)
MCHC: 34.2 g/dL (ref 30.0–36.0)
MCV: 90 fL (ref 78.0–100.0)
PLATELETS: 325 10*3/uL (ref 150–400)
RBC: 4.39 MIL/uL (ref 3.87–5.11)
RDW: 13.7 % (ref 11.5–15.5)
WBC: 8.1 10*3/uL (ref 4.0–10.5)

## 2013-10-27 LAB — SEDIMENTATION RATE: SED RATE: 6 mm/h (ref 0–22)

## 2013-10-27 NOTE — Progress Notes (Signed)
Pre visit review using our clinic review tool, if applicable. No additional management support is needed unless otherwise documented below in the visit note. 

## 2013-10-28 ENCOUNTER — Telehealth: Payer: Self-pay | Admitting: Family Medicine

## 2013-10-28 LAB — HEPATIC FUNCTION PANEL
ALBUMIN: 4.2 g/dL (ref 3.5–5.2)
ALT: 16 U/L (ref 0–35)
AST: 15 U/L (ref 0–37)
Alkaline Phosphatase: 88 U/L (ref 39–117)
Bilirubin, Direct: <0.1 mg/dL (ref 0.0–0.3)
Total Bilirubin: 0.2 mg/dL (ref 0.2–1.2)
Total Protein: 6.9 g/dL (ref 6.0–8.3)

## 2013-10-28 LAB — RENAL FUNCTION PANEL
ALBUMIN: 4.2 g/dL (ref 3.5–5.2)
BUN: 5 mg/dL — ABNORMAL LOW (ref 6–23)
CALCIUM: 8.9 mg/dL (ref 8.4–10.5)
CO2: 29 meq/L (ref 19–32)
CREATININE: 0.52 mg/dL (ref 0.50–1.10)
Chloride: 104 mEq/L (ref 96–112)
Glucose, Bld: 84 mg/dL (ref 70–99)
Phosphorus: 3.3 mg/dL (ref 2.3–4.6)
Potassium: 3.9 mEq/L (ref 3.5–5.3)
Sodium: 141 mEq/L (ref 135–145)

## 2013-10-28 LAB — TSH: TSH: 0.68 u[IU]/mL (ref 0.350–4.500)

## 2013-10-28 NOTE — Telephone Encounter (Signed)
Relevant patient education assigned to patient using Emmi. ° °

## 2013-10-28 NOTE — Telephone Encounter (Signed)
Lab results sent to mychart

## 2013-10-28 NOTE — Telephone Encounter (Signed)
WOULD  LIKE TEST RESULTS

## 2013-10-30 ENCOUNTER — Encounter: Payer: Self-pay | Admitting: Family Medicine

## 2013-10-30 NOTE — Assessment & Plan Note (Signed)
Has been evaluated by her GYN and no GYN cause noted.

## 2013-10-30 NOTE — Progress Notes (Signed)
Patient ID: Megan [name], female   DOB: 27-Mar-1951, 63 y.o.   MRN: [id number] SHAWNTELLE UNGAR [id number] 09/10/1950 10/30/2013      Progress Note-Follow Up  Subjective  Chief Complaint  Chief Complaint  Patient presents with  . Follow-up    hospital    HPI  Patient is a 63 year old female in today for routine medical care and ED follow up. Was seen in ED with increased abdominal and has since been seen by gyn as well. No obvious cause of increased symptoms has been identfied. She reports cramping, chills and bloating. Denies CP/palp/SOB/HA/congestion/fevers or GU c/o. Taking meds as prescribed  Past Medical History  Diagnosis Date  . GERD (gastroesophageal reflux disease)   . Depression   . Anxiety   . Insomnia   . IBS (irritable bowel syndrome)   . Diverticulosis   . Barrett esophagus   . Hiatal hernia   . Unspecified menopausal and postmenopausal disorder   . Vitamin B12 deficiency   . Blind loop syndrome   . Hepatic cyst   . Unspecified hypothyroidism   . Diarrhea   . Family history of malignant neoplasm of gastrointestinal tract     multiple members  . Endometrial hyperplasia 02/27/2006    BENIGN ENDO BX ON 02/2007  . C. difficile diarrhea   . Cystocele, midline   . Rectocele   . Uterine prolapse without mention of vaginal wall prolapse   . Chest pain, atypical 07/14/2011  . Leaky heart valve   . Hiatal hernia   . 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 Thrush 07/21/2011  . Rectal bleeding 10/10/2011  . Sinusitis acute 02/03/2012  . Contact dermatitis 03/23/2012  . Headache(784.0) 04/15/2012  . 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 Hematuria 04/27/2012  . Allergic state 12/31/2012  . 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 Paronychia of great toe, left 06/19/2013  . Low back pain 08/15/2013  . Osteoporosis 05/2011    -2.5 Lt femoral neck    Past Surgical History  Procedure Laterality Date  . Appendectomy    . Uterine fibroid surgery    . Hysteroscopy      POLYP     Family History  Problem Relation Age of Onset  . Colon cancer Mother   . Diabetes Mother   . Hypertension Mother   . Colon polyps Father   . Colon cancer Maternal Aunt   . Colon cancer Maternal Grandmother   . Breast cancer Maternal Grandmother   . Diabetes Paternal Grandmother   . Breast cancer Paternal Grandmother   . Colon cancer Cousin     X3  . Ovarian cancer Cousin     History   Social History  . Marital Status: Married    Spouse Name: N/A    Number of Children: 4  . Years of Education: N/A   Occupational History  . Retired    Social History Main Topics  . Smoking status: Former Smoker    Quit date: 08/19/1995  . Smokeless tobacco: Never Used  . Alcohol Use: No  . Drug Use: No  . Sexual Activity: No   Other Topics Concern  . Not on file   Social History Narrative  . No narrative on file    Current Outpatient Prescriptions on File Prior to Visit  Medication Sig Dispense Refill  . albuterol (VENTOLIN HFA) 108 (90 BASE) MCG/ACT inhaler INHALE 2 PUFFS BY MOUTH EVERY 6 HOURS AS NEEDED  18 each  1  . carvedilol (COREG) 6.25 MG tablet  Take 1 tablet (6.25 mg total) by mouth 2 (two) times daily with a meal.  180 tablet  3  . cyclobenzaprine (FLEXERIL) 10 MG tablet Take 1 tablet (10 mg total) by mouth 3 (three) times daily as needed for muscle spasms.  40 tablet  1  . diazepam (VALIUM) 10 MG tablet Take 1 tablet (10 mg total) by mouth every 12 (twelve) hours as needed for anxiety or sleep.  60 tablet  2  . diltiazem (CARDIZEM CD) 180 MG 24 hr capsule Take 1 capsule (180 mg total) by mouth daily.  90 capsule  3  . escitalopram (LEXAPRO) 20 MG tablet TAKE 1 TABLET BY MOUTH DAILY  30 tablet  3  . fluticasone (FLONASE) 50 MCG/ACT nasal spray Place 2 sprays into the nose daily.  16 g  6  . HYDROcodone-acetaminophen (NORCO/VICODIN) 5-325 MG per tablet Take 1 tablet by mouth every 6 (six) hours as needed.  70 tablet  0  . hyoscyamine (LEVSIN SL) 0.125 MG SL tablet Place  1 tablet (0.125 mg total) under the tongue every 4 (four) hours as needed for cramping.  20 tablet  0  . lactobacillus acidophilus (BACID) TABS tablet Take 2 tablets by mouth 3 (three) times daily.      . methocarbamol (ROBAXIN) 500 MG tablet Take 2 tablets (1,000 mg total) by mouth 3 (three) times daily as needed for muscle spasms.  20 tablet  0  . promethazine (PHENERGAN) 25 MG tablet Take 1 tablet (25 mg total) by mouth 3 (three) times daily as needed for nausea.  30 tablet  2   No current facility-administered medications on file prior to visit.    Allergies  Allergen Reactions  . Sulfamethoxazole Shortness Of Breath    chest tightness  . Iohexol   . Iodinated Diagnostic Agents     Pt states at Dr.Grapeys office 15 years ago blacked out for 2 hours during injection for IVP. Was told never to have IV dye again.     Review of Systems  Review of Systems  Constitutional: Negative for fever and malaise/fatigue.  HENT: Negative for congestion.   Eyes: Negative for discharge.  Respiratory: Negative for shortness of breath.   Cardiovascular: Negative for chest pain, palpitations and leg swelling.  Gastrointestinal: Positive for heartburn, nausea, abdominal pain and blood in stool. Negative for diarrhea.  Genitourinary: Negative for dysuria.  Musculoskeletal: Negative for falls.  Skin: Negative for rash.  Neurological: Negative for loss of consciousness and headaches.  Endo/Heme/Allergies: Negative for polydipsia.  Psychiatric/Behavioral: Negative for depression and suicidal ideas. The patient is not nervous/anxious and does not have insomnia.     Objective  Ht 5\' 5"  (1.651 m)  Physical Exam  Physical Exam  Constitutional: She is oriented to person, place, and time and well-developed, well-nourished, and in no distress. No distress.  HENT:  Head: Normocephalic and atraumatic.  Eyes: Conjunctivae are normal.  Neck: Neck supple. No thyromegaly present.  Cardiovascular: Normal  rate, regular rhythm and normal heart sounds.   No murmur heard. Pulmonary/Chest: Effort normal and breath sounds normal. She has no wheezes.  Abdominal: Soft. Bowel sounds are normal. She exhibits no distension and no mass. There is tenderness. There is no rebound and no guarding.  Mildly tender with palp diffusely  Musculoskeletal: She exhibits no edema.  Lymphadenopathy:    She has no cervical adenopathy.  Neurological: She is alert and oriented to person, place, and time.  Skin: Skin is warm and dry. No rash noted.  She is not diaphoretic.  Psychiatric: Memory, affect and judgment normal.    Lab Results  Component Value Date   TSH 0.680 10/27/2013   Lab Results  Component Value Date   WBC 8.1 10/27/2013   HGB 13.5 10/27/2013   HCT 39.5 10/27/2013   MCV 90.0 10/27/2013   PLT 325 10/27/2013   Lab Results  Component Value Date   CREATININE 0.52 10/27/2013   BUN 5* 10/27/2013   NA 141 10/27/2013   K 3.9 10/27/2013   CL 104 10/27/2013   CO2 29 10/27/2013   Lab Results  Component Value Date   ALT 16 10/27/2013   AST 15 10/27/2013   ALKPHOS 88 10/27/2013   BILITOT 0.2 10/27/2013   Lab Results  Component Value Date   CHOL 218* 06/30/2011   Lab Results  Component Value Date   HDL 55.80 06/30/2011   Lab Results  Component Value Date   LDLCALC 112 11/21/2010   Lab Results  Component Value Date   TRIG 80.0 06/30/2011   Lab Results  Component Value Date   CHOLHDL 4 06/30/2011     Assessment & Plan  HYPERTENSION Poorly controlled will continue current medications, encouraged DASH diet, minimize caffeine and obtain adequate sleep. Report concerning symptoms and follow up as directed and as needed. Increased secondary to pain.  Abdominal pain Recently seen in ED, no obvious cause found. Labs normal today. Encouraged to follow up with her gastroenterologist at [facility] due to symptoms.   Endometrial hyperplasia Has been evaluated by her GYN and no GYN cause noted.  GERD Avoid  offending foods, start probiotics. Do not eat large meals in late evening and consider raising head of bed.

## 2013-10-30 NOTE — Assessment & Plan Note (Signed)
Poorly controlled will continue current medications, encouraged DASH diet, minimize caffeine and obtain adequate sleep. Report concerning symptoms and follow up as directed and as needed. Increased secondary to pain.

## 2013-10-30 NOTE — Assessment & Plan Note (Signed)
Recently seen in ED, no obvious cause found. Labs normal today. Encouraged to follow up with her gastroenterologist at [facility] due to symptoms.

## 2013-10-30 NOTE — Assessment & Plan Note (Signed)
Avoid offending foods, start probiotics. Do not eat large meals in late evening and consider raising head of bed.  

## 2013-11-02 ENCOUNTER — Other Ambulatory Visit: Payer: Self-pay | Admitting: Family Medicine

## 2013-11-03 ENCOUNTER — Telehealth: Payer: Self-pay | Admitting: Cardiovascular Disease

## 2013-11-03 ENCOUNTER — Telehealth: Payer: Self-pay

## 2013-11-03 DIAGNOSIS — I10 Essential (primary) hypertension: Secondary | ICD-10-CM

## 2013-11-03 NOTE — Telephone Encounter (Signed)
Spoke with patient who states she is having blood pressure spikes, more frequently this week.  Patient c/o headache and nausea with elevated BP.  Patient states she took Prednisone earlier this week in preparation for a CAT scan as she is allergic to dye.  Patient took last dose of Prednisone Monday night at approximately 8 pm.  I advised patient that it can take 36 hours for prednisone to be cleared from her system and that per Dr. Antionette Char notes at last ov 9/14, patient may take an extra Carvedilol 6.25 mg for elevated blood pressure.  Patient states her BP is currently 179/88, HR 75.  Patient reports she is currently taking Diltiazem 180 mg QD and Carvedilol 6.25 mg BID for hypertension.  Patient is agreeable to take extra 6.25 mg Carvedilol today and will recheck BP later today.  I advised patient that she may repeat this therapy tomorrow if needed and to notify our office if BP does not improve tomorrow.  Patient verbalized agreement and understanding.

## 2013-11-03 NOTE — Telephone Encounter (Signed)
Pleasecheck with patient but do not think she still takes hctz

## 2013-11-03 NOTE — Telephone Encounter (Signed)
Sure HCTZ 25 mg po daily disp #30 with 1 rf but should come in in roughly 2 weeks for bp check and renal panel

## 2013-11-03 NOTE — Telephone Encounter (Signed)
Pt would like to know if she should take the HCTZ again since she is retaining water?

## 2013-11-03 NOTE — Telephone Encounter (Signed)
Please advise refill? Hydrochlorothiazide 25 mg is no longer in pt's med list.

## 2013-11-03 NOTE — Telephone Encounter (Signed)
New Prob     Pt states she has been experiencing HAs and bilateral ear pain she associates with spiking of her BP. Pt is concerned and would like to speak to a nurse.

## 2013-11-04 ENCOUNTER — Encounter: Payer: BC Managed Care – PPO | Admitting: Gynecology

## 2013-11-04 MED ORDER — HYDROCHLOROTHIAZIDE 25 MG PO TABS: 25.0000 mg | ORAL_TABLET | Freq: Every day | ORAL | Status: DC

## 2013-11-04 MED ORDER — CARVEDILOL 12.5 MG PO TABS: 12.5000 mg | ORAL_TABLET | Freq: Two times a day (BID) | ORAL | Status: DC

## 2013-11-04 NOTE — Telephone Encounter (Signed)
Spoke with patient who reports her BP today is 169/88.  I advised patient that Dr. Burt Knack recommends patient increase Carvedilol (Coreg) to 12.5 mg twice daily.  Patient states her PCP called this morning and advised patient to start a new medication which patient was going to go to pharmacy and pick up.  Patient states she will follow Dr. Antionette Char advice and will increase Coreg instead of starting the new medication.  I advised patient to monitor her BP and to call me next week to report.  Patient verbalized agreement and understanding.

## 2013-11-04 NOTE — Telephone Encounter (Signed)
Left a message for patient to return my call.  Medication sent and labs ordered for 11-16-13 due to the [place] weekend

## 2013-11-04 NOTE — Telephone Encounter (Signed)
May be best to increase carvedilol to 12.5 mg twice daily. Continue to monitor blood pressure and call back if this remains elevated.

## 2013-11-04 NOTE — Telephone Encounter (Signed)
Patient informed. I tried to transfer but it hung up. I will call patient back.

## 2013-11-16 ENCOUNTER — Other Ambulatory Visit: Payer: Self-pay | Admitting: Family Medicine

## 2013-11-16 ENCOUNTER — Ambulatory Visit (INDEPENDENT_AMBULATORY_CARE_PROVIDER_SITE_OTHER): Payer: BC Managed Care – PPO | Admitting: *Deleted

## 2013-11-16 VITALS — BP 132/82

## 2013-11-16 DIAGNOSIS — R51 Headache: Secondary | ICD-10-CM

## 2013-11-16 DIAGNOSIS — I10 Essential (primary) hypertension: Secondary | ICD-10-CM

## 2013-11-16 NOTE — Progress Notes (Signed)
   Subjective:    Patient ID: Megan [name], female    DOB: 05-04-1951, 63 y.o.   MRN: [id number]  HPI    Review of Systems     Objective:   Physical Exam        Assessment & Plan:  Pt presented for blood pressure check. Blood pressure was taken with large size cuff.  Blood pressure taken in sitting position/SLS

## 2013-11-17 MED ORDER — HYDROCODONE-ACETAMINOPHEN 5-325 MG PO TABS: 1.0000 | ORAL_TABLET | Freq: Four times a day (QID) | ORAL | Status: DC | PRN

## 2013-11-17 NOTE — Telephone Encounter (Signed)
RX printed for MD to sign and message sent to mychart.  Last RX was done on 10-18-13 quantity 70 with 0 refills

## 2013-11-24 ENCOUNTER — Telehealth: Payer: Self-pay | Admitting: Family Medicine

## 2013-11-24 NOTE — Telephone Encounter (Signed)
Patient left message stating she previously turned down Dr. Rhae Lerner offer to call in [place], she is now having a lot more trouble sleeping due to stress, she has tried Tylenol PM with no help

## 2013-11-24 NOTE — Telephone Encounter (Signed)
OK to call her in Ambien 10 mg tabs, 1/2 to 1 tab po qhs prn insomnia, disp #15. Marcelyn Ditty

## 2013-11-24 NOTE — Telephone Encounter (Signed)
Dr Charlett Blake, can we call in Ambien for pt.

## 2013-11-25 NOTE — Telephone Encounter (Signed)
Called pt to verify pharmacy. Sent Rx into [facility].

## 2013-12-05 ENCOUNTER — Telehealth: Payer: Self-pay | Admitting: Family Medicine

## 2013-12-05 MED ORDER — HYDROCORTISONE ACETATE 25 MG RE SUPP: 25.0000 mg | Freq: Every evening | RECTAL | Status: DC | PRN

## 2013-12-05 NOTE — Telephone Encounter (Signed)
Can something be called into [facility] for hemorrhoids

## 2013-12-05 NOTE — Telephone Encounter (Signed)
Please advise 

## 2013-12-05 NOTE — Telephone Encounter (Signed)
anusol hc suppositories, 1 pr qhs x 7 days and then prn, disp #20 with 1 rf to [facility]

## 2013-12-07 ENCOUNTER — Encounter: Payer: BC Managed Care – PPO | Admitting: Gynecology

## 2013-12-07 ENCOUNTER — Encounter: Payer: Self-pay | Admitting: Physician Assistant

## 2013-12-07 ENCOUNTER — Ambulatory Visit (INDEPENDENT_AMBULATORY_CARE_PROVIDER_SITE_OTHER): Payer: BC Managed Care – PPO | Admitting: Physician Assistant

## 2013-12-07 VITALS — BP 110/78 | HR 68 | Temp 98.1°F | Resp 16 | Ht 65.0 in | Wt 163.5 lb

## 2013-12-07 DIAGNOSIS — J329 Chronic sinusitis, unspecified: Secondary | ICD-10-CM

## 2013-12-07 MED ORDER — AZITHROMYCIN 250 MG PO TABS: ORAL_TABLET | ORAL | Status: DC

## 2013-12-07 NOTE — Patient Instructions (Signed)
Please take antibiotic as directed.  Increase fluid intake.  Use Saline nasal spray.  Take a daily multivitamin. Continue allergy medications and Flonase.  Place a humidifier in the bedroom.  Please call or return clinic if symptoms are not improving.  Sinusitis Sinusitis is redness, soreness, and swelling (inflammation) of the paranasal sinuses. Paranasal sinuses are air pockets within the bones of your face (beneath the eyes, the middle of the forehead, or above the eyes). In healthy paranasal sinuses, mucus is able to drain out, and air is able to circulate through them by way of your nose. However, when your paranasal sinuses are inflamed, mucus and air can become trapped. This can allow bacteria and other germs to grow and cause infection. Sinusitis can develop quickly and last only a short time (acute) or continue over a long period (chronic). Sinusitis that lasts for more than 12 weeks is considered chronic.  CAUSES  Causes of sinusitis include:  Allergies.  Structural abnormalities, such as displacement of the cartilage that separates your nostrils (deviated septum), which can decrease the air flow through your nose and sinuses and affect sinus drainage.  Functional abnormalities, such as when the small hairs (cilia) that line your sinuses and help remove mucus do not work properly or are not present. SYMPTOMS  Symptoms of acute and chronic sinusitis are the same. The primary symptoms are pain and pressure around the affected sinuses. Other symptoms include:  Upper toothache.  Earache.  Headache.  Bad breath.  Decreased sense of smell and taste.  A cough, which worsens when you are lying flat.  Fatigue.  Fever.  Thick drainage from your nose, which often is green and may contain pus (purulent).  Swelling and warmth over the affected sinuses. DIAGNOSIS  Your caregiver will perform a physical exam. During the exam, your caregiver may:  Look in your nose for signs of  abnormal growths in your nostrils (nasal polyps).  Tap over the affected sinus to check for signs of infection.  View the inside of your sinuses (endoscopy) with a special imaging device with a light attached (endoscope), which is inserted into your sinuses. If your caregiver suspects that you have chronic sinusitis, one or more of the following tests may be recommended:  Allergy tests.  Nasal culture A sample of mucus is taken from your nose and sent to a lab and screened for bacteria.  Nasal cytology A sample of mucus is taken from your nose and examined by your caregiver to determine if your sinusitis is related to an allergy. TREATMENT  Most cases of acute sinusitis are related to a viral infection and will resolve on their own within 10 days. Sometimes medicines are prescribed to help relieve symptoms (pain medicine, decongestants, nasal steroid sprays, or saline sprays).  However, for sinusitis related to a bacterial infection, your caregiver will prescribe antibiotic medicines. These are medicines that will help kill the bacteria causing the infection.  Rarely, sinusitis is caused by a fungal infection. In theses cases, your caregiver will prescribe antifungal medicine. For some cases of chronic sinusitis, surgery is needed. Generally, these are cases in which sinusitis recurs more than 3 times per year, despite other treatments. HOME CARE INSTRUCTIONS   Drink plenty of water. Water helps thin the mucus so your sinuses can drain more easily.  Use a humidifier.  Inhale steam 3 to 4 times a day (for example, sit in the bathroom with the shower running).  Apply a warm, moist washcloth to your face 3   to 4 times a day, or as directed by your caregiver.  Use saline nasal sprays to help moisten and clean your sinuses.  Take over-the-counter or prescription medicines for pain, discomfort, or fever only as directed by your caregiver. SEEK IMMEDIATE MEDICAL CARE IF:  You have increasing  pain or severe headaches.  You have nausea, vomiting, or drowsiness.  You have swelling around your face.  You have vision problems.  You have a stiff neck.  You have difficulty breathing. MAKE SURE YOU:   Understand these instructions.  Will watch your condition.  Will get help right away if you are not doing well or get worse. Document Released: 08/04/2005 Document Revised: 10/27/2011 Document Reviewed: 08/19/2011 Exit[employer] Patient Information 2014 [employer].   

## 2013-12-07 NOTE — Assessment & Plan Note (Signed)
Rx Azithromycin.  Increase fluids.  Rest.  Saline nasal spray.  Delsym for cough.  Continue allergy medications.  Humidifier in bedroom.  Call or RTC if symptoms not improving.

## 2013-12-07 NOTE — Progress Notes (Signed)
Patient presents to clinic today c/o sinus pressure, sinus pain, left ear pressure and pain, PND and mild non-productive cough x 2 weeks.  Endorses thick rhinorrhea.  Denies fever, SOB, wheezing, tooth pain.  Denies recent travel or sick contact.  Has history of sever sinus infections.  Past Medical History  Diagnosis Date  . GERD (gastroesophageal reflux disease)   . Depression   . Anxiety   . Insomnia   . IBS (irritable bowel syndrome)   . Diverticulosis   . Barrett esophagus   . Hiatal hernia   . Unspecified menopausal and postmenopausal disorder   . Vitamin B12 deficiency   . Blind loop syndrome   . Hepatic cyst   . Unspecified hypothyroidism   . Diarrhea   . Family history of malignant neoplasm of gastrointestinal tract     multiple members  . Endometrial hyperplasia 02/27/2006    BENIGN ENDO BX ON 02/2007  . C. difficile diarrhea   . Cystocele, midline   . Rectocele   . Uterine prolapse without mention of vaginal wall prolapse   . Chest pain, atypical 07/14/2011  . Leaky heart valve   . Hiatal hernia   . 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 Thrush 07/21/2011  . Rectal bleeding 10/10/2011  . Sinusitis acute 02/03/2012  . Contact dermatitis 03/23/2012  . Headache(784.0) 04/15/2012  . 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 Hematuria 04/27/2012  . Allergic state 12/31/2012  . 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 Paronychia of great toe, left 06/19/2013  . Low back pain 08/15/2013  . Osteoporosis 05/2011    -2.5 Lt femoral neck    Current Outpatient Prescriptions on File Prior to Visit  Medication Sig Dispense Refill  . albuterol (VENTOLIN HFA) 108 (90 BASE) MCG/ACT inhaler INHALE 2 PUFFS BY MOUTH EVERY 6 HOURS AS NEEDED  18 each  1  . carvedilol (COREG) 12.5 MG tablet Take 1 tablet (12.5 mg total) by mouth 2 (two) times daily with a meal.  180 tablet  3  . cyclobenzaprine (FLEXERIL) 10 MG tablet Take 1 tablet (10 mg total) by mouth 3 (three) times daily as needed for muscle spasms.  40  tablet  1  . diazepam (VALIUM) 10 MG tablet Take 1 tablet (10 mg total) by mouth every 12 (twelve) hours as needed for anxiety or sleep.  60 tablet  2  . diltiazem (CARDIZEM CD) 180 MG 24 hr capsule Take 1 capsule (180 mg total) by mouth daily.  90 capsule  3  . fluticasone (FLONASE) 50 MCG/ACT nasal spray Place 2 sprays into the nose daily.  16 g  6  . hydrochlorothiazide (HYDRODIURIL) 25 MG tablet Take 1 tablet (25 mg total) by mouth daily.  30 tablet  1  . HYDROcodone-acetaminophen (NORCO/VICODIN) 5-325 MG per tablet Take 1 tablet by mouth every 6 (six) hours as needed.  70 tablet  0  . hydrocortisone (ANUSOL-HC) 25 MG suppository Place 1 suppository (25 mg total) rectally at bedtime as needed for hemorrhoids. X 7 days then prn  20 suppository  1  . hyoscyamine (LEVSIN SL) 0.125 MG SL tablet Place 1 tablet (0.125 mg total) under the tongue every 4 (four) hours as needed for cramping.  20 tablet  0  . lactobacillus acidophilus (BACID) TABS tablet Take 2 tablets by mouth 3 (three) times daily.      . methocarbamol (ROBAXIN) 500 MG tablet Take 2 tablets (1,000 mg total) by mouth 3 (three) times daily as needed for muscle spasms.  20 tablet  0  . promethazine (PHENERGAN) 25 MG tablet Take 1 tablet (25 mg total) by mouth 3 (three) times daily as needed for nausea.  30 tablet  2   No current facility-administered medications on file prior to visit.    Allergies  Allergen Reactions  . Sulfamethoxazole Shortness Of Breath    chest tightness  . Iohexol   . Iodinated Diagnostic Agents     Pt states at Dr.Grapeys office 15 years ago blacked out for 2 hours during injection for IVP. Was told never to have IV dye again.     Family History  Problem Relation Age of Onset  . Colon cancer Mother   . Diabetes Mother   . Hypertension Mother   . Colon polyps Father   . Colon cancer Maternal Aunt   . Colon cancer Maternal Grandmother   . Breast cancer Maternal Grandmother   . Diabetes Paternal  Grandmother   . Breast cancer Paternal Grandmother   . Colon cancer Cousin     X3  . Ovarian cancer Cousin     History   Social History  . Marital Status: Married    Spouse Name: N/A    Number of Children: 4  . Years of Education: N/A   Occupational History  . Retired    Social History Main Topics  . Smoking status: Former Smoker    Quit date: 08/19/1995  . Smokeless tobacco: Never Used  . Alcohol Use: No  . Drug Use: No  . Sexual Activity: No   Other Topics Concern  . None   Social History Narrative  . None    Review of Systems - See HPI.  All other ROS are negative.  BP 110/78  Pulse 68  Temp(Src) 98.1 F (36.7 C) (Oral)  Resp 16  Ht 5\' 5"  (1.651 m)  Wt 163 lb 8 oz (74.163 kg)  BMI 27.21 kg/m2  SpO2 98%  Physical Exam  Vitals reviewed. Constitutional: She is oriented to person, place, and time and well-developed, well-nourished, and in no distress.  HENT:  Head: Normocephalic and atraumatic.  Right Ear: External ear normal.  Left Ear: External ear normal.  Nose: Nose normal.  Mouth/Throat: Oropharynx is clear and moist. No oropharyngeal exudate.  TM within normal limits bilaterally.  + TTP of left frontal and maxillary sinuses.  Eyes: Conjunctivae are normal. Pupils are equal, round, and reactive to light.  Neck: Neck supple.  Cardiovascular: Normal rate, regular rhythm and intact distal pulses.   I/VI chronic murmur auscultated.  Pulmonary/Chest: Effort normal and breath sounds normal. No respiratory distress. She has no wheezes. She has no rales. She exhibits no tenderness.  Lymphadenopathy:    She has no cervical adenopathy.  Neurological: She is alert and oriented to person, place, and time.  Skin: Skin is warm and dry. No rash noted.  Psychiatric: Affect normal.    Recent Results (from the past 2160 hour(s))  URINALYSIS, ROUTINE W REFLEX MICROSCOPIC     Status: Abnormal   Collection Time    10/08/13 10:53 AM      Result Value Ref Range    Color, Urine YELLOW  YELLOW   APPearance CLEAR  CLEAR   Specific Gravity, Urine 1.013  1.005 - 1.030   pH 7.5  5.0 - 8.0   Glucose, UA NEGATIVE  NEGATIVE mg/dL   Hgb urine dipstick NEGATIVE  NEGATIVE   Bilirubin Urine NEGATIVE  NEGATIVE   Ketones, ur NEGATIVE  NEGATIVE mg/dL   Protein, ur NEGATIVE  NEGATIVE mg/dL   Urobilinogen, UA 0.2  0.0 - 1.0 mg/dL   Nitrite NEGATIVE  NEGATIVE   Leukocytes, UA SMALL (*) NEGATIVE  URINE MICROSCOPIC-ADD ON     Status: Abnormal   Collection Time    10/08/13 10:53 AM      Result Value Ref Range   Squamous Epithelial / LPF RARE  RARE   WBC, UA 0-2  <3 WBC/hpf   Bacteria, UA FEW (*) RARE  URINALYSIS, ROUTINE W REFLEX MICROSCOPIC     Status: Abnormal   Collection Time    10/09/13 11:28 AM      Result Value Ref Range   Color, Urine YELLOW  YELLOW   APPearance CLEAR  CLEAR   Specific Gravity, Urine 1.004 (*) 1.005 - 1.030   pH 7.0  5.0 - 8.0   Glucose, UA NEGATIVE  NEGATIVE mg/dL   Hgb urine dipstick NEGATIVE  NEGATIVE   Bilirubin Urine NEGATIVE  NEGATIVE   Ketones, ur NEGATIVE  NEGATIVE mg/dL   Protein, ur NEGATIVE  NEGATIVE mg/dL   Urobilinogen, UA 0.2  0.0 - 1.0 mg/dL   Nitrite NEGATIVE  NEGATIVE   Leukocytes, UA SMALL (*) NEGATIVE  URINE MICROSCOPIC-ADD ON     Status: None   Collection Time    10/09/13 11:28 AM      Result Value Ref Range   Squamous Epithelial / LPF RARE  RARE   WBC, UA 0-2  <3 WBC/hpf   RBC / HPF 0-2  <3 RBC/hpf   Bacteria, UA RARE  RARE  CBC     Status: None   Collection Time    10/27/13  3:00 PM      Result Value Ref Range   WBC 8.1  4.0 - 10.5 K/uL   RBC 4.39  3.87 - 5.11 MIL/uL   Hemoglobin 13.5  12.0 - 15.0 g/dL   HCT 39.5  36.0 - 46.0 %   MCV 90.0  78.0 - 100.0 fL   MCH 30.8  26.0 - 34.0 pg   MCHC 34.2  30.0 - 36.0 g/dL   RDW 13.7  11.5 - 15.5 %   Platelets 325  150 - 400 K/uL  RENAL FUNCTION PANEL     Status: Abnormal   Collection Time    10/27/13  3:00 PM      Result Value Ref Range   Sodium 141   135 - 145 mEq/L   Potassium 3.9  3.5 - 5.3 mEq/L   Chloride 104  96 - 112 mEq/L   CO2 29  19 - 32 mEq/L   Glucose, Bld 84  70 - 99 mg/dL   BUN 5 (*) 6 - 23 mg/dL   Creat 0.52  0.50 - 1.10 mg/dL   Albumin 4.2  3.5 - 5.2 g/dL   Calcium 8.9  8.4 - 10.5 mg/dL   Phosphorus 3.3  2.3 - 4.6 mg/dL  HEPATIC FUNCTION PANEL     Status: None   Collection Time    10/27/13  3:00 PM      Result Value Ref Range   Total Bilirubin 0.2  0.2 - 1.2 mg/dL   Bilirubin, Direct <0.1  0.0 - 0.3 mg/dL   Indirect Bilirubin NOT CALC  0.2 - 1.2 mg/dL   Alkaline Phosphatase 88  39 - 117 U/L   AST 15  0 - 37 U/L   ALT 16  0 - 35 U/L   Total Protein 6.9  6.0 - 8.3 g/dL   Albumin 4.2  3.5 -  5.2 g/dL  TSH     Status: None   Collection Time    10/27/13  3:00 PM      Result Value Ref Range   TSH 0.680  0.350 - 4.500 uIU/mL  SEDIMENTATION RATE     Status: None   Collection Time    10/27/13  3:00 PM      Result Value Ref Range   Sed Rate 6  0 - 22 mm/hr    Assessment/Plan: Sinusitis Rx Azithromycin.  Increase fluids.  Rest.  Saline nasal spray.  Delsym for cough.  Continue allergy medications.  Humidifier in bedroom.  Call or RTC if symptoms not improving.

## 2013-12-07 NOTE — Progress Notes (Signed)
Pre visit review using our clinic review tool, if applicable. No additional management support is needed unless otherwise documented below in the visit note/SLS  

## 2013-12-13 ENCOUNTER — Encounter: Payer: Self-pay | Admitting: Family Medicine

## 2013-12-14 ENCOUNTER — Other Ambulatory Visit: Payer: Self-pay | Admitting: Family Medicine

## 2013-12-14 DIAGNOSIS — F418 Other specified anxiety disorders: Secondary | ICD-10-CM

## 2013-12-14 MED ORDER — FLUOXETINE HCL 20 MG PO TABS: 20.0000 mg | ORAL_TABLET | Freq: Every day | ORAL | Status: DC

## 2013-12-27 ENCOUNTER — Other Ambulatory Visit: Payer: Self-pay | Admitting: Family Medicine

## 2014-01-02 ENCOUNTER — Other Ambulatory Visit: Payer: Self-pay | Admitting: Family Medicine

## 2014-01-02 NOTE — Telephone Encounter (Signed)
Last RX was done on 11-25-13 quantity 15 with 0 refills  RX printed for md to sign and fax

## 2014-01-11 ENCOUNTER — Telehealth: Payer: Self-pay | Admitting: *Deleted

## 2014-01-11 ENCOUNTER — Other Ambulatory Visit: Payer: Self-pay | Admitting: Family Medicine

## 2014-01-11 DIAGNOSIS — R51 Headache: Secondary | ICD-10-CM

## 2014-01-11 MED ORDER — HYDROCODONE-ACETAMINOPHEN 5-325 MG PO TABS: 1.0000 | ORAL_TABLET | Freq: Four times a day (QID) | ORAL | Status: DC | PRN

## 2014-01-11 NOTE — Telephone Encounter (Signed)
Pt left message requesting refill of hydrocodone for her deviated septum or worsening sinuses. Last rx printed 11/17/13, #70. Please advise.

## 2014-01-11 NOTE — Telephone Encounter (Signed)
printed

## 2014-01-12 NOTE — Telephone Encounter (Signed)
Left a message stating that RX is ready to be picked up

## 2014-01-19 ENCOUNTER — Other Ambulatory Visit: Payer: Self-pay | Admitting: Family Medicine

## 2014-01-19 NOTE — Telephone Encounter (Signed)
RX printed for md to signa and send to pharmacy  Last RX was done on 12-16-13 quantity 15 with 0 refills

## 2014-01-27 ENCOUNTER — Encounter: Payer: Self-pay | Admitting: Physician Assistant

## 2014-01-27 ENCOUNTER — Ambulatory Visit (INDEPENDENT_AMBULATORY_CARE_PROVIDER_SITE_OTHER): Payer: BC Managed Care – PPO | Admitting: Physician Assistant

## 2014-01-27 VITALS — BP 116/82 | HR 72 | Temp 98.2°F | Resp 16 | Ht 65.0 in | Wt 159.0 lb

## 2014-01-27 DIAGNOSIS — J329 Chronic sinusitis, unspecified: Secondary | ICD-10-CM

## 2014-01-27 MED ORDER — METHYLPREDNISOLONE ACETATE 40 MG/ML IJ SUSP
40.0000 mg | Freq: Once | INTRAMUSCULAR | Status: AC
  Administered 2014-01-27: 40 mg via INTRAMUSCULAR

## 2014-01-27 MED ORDER — AMOXICILLIN-POT CLAVULANATE 875-125 MG PO TABS: 1.0000 | ORAL_TABLET | Freq: Two times a day (BID) | ORAL | Status: DC

## 2014-01-27 MED ORDER — HYDROCOD POLST-CHLORPHEN POLST 10-8 MG/5ML PO LQCR: 5.0000 mL | Freq: Two times a day (BID) | ORAL | Status: DC | PRN

## 2014-01-27 NOTE — Progress Notes (Signed)
Pre visit review using our clinic review tool, if applicable. No additional management support is needed unless otherwise documented below in the visit note/SLS  

## 2014-01-27 NOTE — Assessment & Plan Note (Signed)
Im 40 Depo medrol given.  Rx Augmentin.  Rx Tussionex for cough.  Increase fluids.  Rest.  Continue allergy medications.  Humidifier in bedroom. Discussed need for repeat visit with ENT giving recurrent infections.  Patient voices understanding.

## 2014-01-27 NOTE — Addendum Note (Signed)
Addended by: Rockwell Germany on: 01/27/2014 03:58 PM   Modules accepted: Orders

## 2014-01-27 NOTE — Progress Notes (Signed)
Patient presents to clinic today c/o 3-4 weeks of sinus pressure, sinus pain, ear pain and tooth pain. Endorses intermittent fevers and chills.  Denies recent travel or sick contact.  Patient with deviated septum and history of chronic sinusitis.  Is followed by an ENT at [facility].  Was told she needed surgery but has not proceeded with this.   Past Medical History  Diagnosis Date  . GERD (gastroesophageal reflux disease)   . Depression   . Anxiety   . Insomnia   . IBS (irritable bowel syndrome)   . Diverticulosis   . Barrett esophagus   . Hiatal hernia   . Unspecified menopausal and postmenopausal disorder   . Vitamin B12 deficiency   . Blind loop syndrome   . Hepatic cyst   . Unspecified hypothyroidism   . Diarrhea   . Family history of malignant neoplasm of gastrointestinal tract     multiple members  . Endometrial hyperplasia 02/27/2006    BENIGN ENDO BX ON 02/2007  . C. difficile diarrhea   . Cystocele, midline   . Rectocele   . Uterine prolapse without mention of vaginal wall prolapse   . Chest pain, atypical 07/14/2011  . Leaky heart valve   . Hiatal hernia   . 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 Thrush 07/21/2011  . Rectal bleeding 10/10/2011  . Sinusitis acute 02/03/2012  . Contact dermatitis 03/23/2012  . Headache(784.0) 04/15/2012  . 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 Hematuria 04/27/2012  . Allergic state 12/31/2012  . 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 Paronychia of great toe, left 06/19/2013  . Low back pain 08/15/2013  . Osteoporosis 05/2011    -2.5 Lt femoral neck    Current Outpatient Prescriptions on File Prior to Visit  Medication Sig Dispense Refill  . albuterol (VENTOLIN HFA) 108 (90 BASE) MCG/ACT inhaler INHALE 2 PUFFS BY MOUTH EVERY 6 HOURS AS NEEDED  18 each  1  . carvedilol (COREG) 12.5 MG tablet Take 1 tablet (12.5 mg total) by mouth 2 (two) times daily with a meal.  180 tablet  3  . cyclobenzaprine (FLEXERIL) 10 MG tablet Take 1 tablet (10 mg total)  by mouth 3 (three) times daily as needed for muscle spasms.  40 tablet  1  . diazepam (VALIUM) 10 MG tablet Take 1 tablet (10 mg total) by mouth every 12 (twelve) hours as needed for anxiety or sleep.  60 tablet  2  . diltiazem (CARDIZEM CD) 180 MG 24 hr capsule Take 1 capsule (180 mg total) by mouth daily.  90 capsule  3  . FLUoxetine (PROZAC) 20 MG tablet Take 1 tablet (20 mg total) by mouth daily.  30 tablet  3  . fluticasone (FLONASE) 50 MCG/ACT nasal spray Place 2 sprays into the nose daily.  16 g  6  . hydrochlorothiazide (HYDRODIURIL) 25 MG tablet TAKE 1 TABLET BY MOUTH EVERY DAY  30 tablet  0  . HYDROcodone-acetaminophen (NORCO/VICODIN) 5-325 MG per tablet Take 1 tablet by mouth every 6 (six) hours as needed.  70 tablet  0  . hydrocortisone (ANUSOL-HC) 25 MG suppository Place 1 suppository (25 mg total) rectally at bedtime as needed for hemorrhoids. X 7 days then prn  20 suppository  1  . hyoscyamine (LEVSIN SL) 0.125 MG SL tablet Place 1 tablet (0.125 mg total) under the tongue every 4 (four) hours as needed for cramping.  20 tablet  0  . lactobacillus acidophilus (BACID) TABS tablet Take 2 tablets by mouth 3 (three) times  daily.      . methocarbamol (ROBAXIN) 500 MG tablet Take 2 tablets (1,000 mg total) by mouth 3 (three) times daily as needed for muscle spasms.  20 tablet  0  . promethazine (PHENERGAN) 25 MG tablet Take 1 tablet (25 mg total) by mouth 3 (three) times daily as needed for nausea.  30 tablet  2  . zolpidem (AMBIEN) 10 MG tablet TAKE 1/2-1 TABLET BY MOUTH EVERY NIGHT AT BEDTIME AS NEEDED  15 tablet  0   No current facility-administered medications on file prior to visit.    Allergies  Allergen Reactions  . Sulfamethoxazole Shortness Of Breath    chest tightness  . Iohexol   . Iodinated Diagnostic Agents     Pt states at Dr.Grapeys office 15 years ago blacked out for 2 hours during injection for IVP. Was told never to have IV dye again.     Family History  Problem  Relation Age of Onset  . Colon cancer Mother   . Diabetes Mother   . Hypertension Mother   . Colon polyps Father   . Colon cancer Maternal Aunt   . Colon cancer Maternal Grandmother   . Breast cancer Maternal Grandmother   . Diabetes Paternal Grandmother   . Breast cancer Paternal Grandmother   . Colon cancer Cousin     X3  . Ovarian cancer Cousin     History   Social History  . Marital Status: Married    Spouse Name: N/A    Number of Children: 4  . Years of Education: N/A   Occupational History  . Retired    Social History Main Topics  . Smoking status: Former Smoker    Quit date: 08/19/1995  . Smokeless tobacco: Never Used  . Alcohol Use: No  . Drug Use: No  . Sexual Activity: No   Other Topics Concern  . None   Social History Narrative  . None   Review of Systems - See HPI.  All other ROS are negative.  BP 116/82  Pulse 72  Temp(Src) 98.2 F (36.8 C) (Oral)  Resp 16  Ht 5\' 5"  (1.651 m)  Wt 159 lb (72.122 kg)  BMI 26.46 kg/m2  SpO2 98%  Physical Exam  Vitals reviewed. Constitutional: She is oriented to person, place, and time and well-developed, well-nourished, and in no distress.  HENT:  Head: Normocephalic and atraumatic.  Right Ear: External ear normal.  Left Ear: External ear normal.  Nose: Nose normal.  Mouth/Throat: Oropharynx is clear and moist. No oropharyngeal exudate.  TM within normal limits bilaterally.  + TTP of sinuses noted on examination.   Eyes: Conjunctivae are normal. Pupils are equal, round, and reactive to light.  Neck: Neck supple.  Cardiovascular: Normal rate, regular rhythm, normal heart sounds and intact distal pulses.   Pulmonary/Chest: Effort normal and breath sounds normal. No respiratory distress. She has no wheezes. She has no rales. She exhibits no tenderness.  Lymphadenopathy:    She has no cervical adenopathy.  Neurological: She is alert and oriented to person, place, and time.  Skin: Skin is warm and dry. No rash  noted.  Psychiatric: Affect normal.   Assessment/Plan: Sinusitis Im 40 Depo medrol given.  Rx Augmentin.  Rx Tussionex for cough.  Increase fluids.  Rest.  Continue allergy medications.  Humidifier in bedroom. Discussed need for repeat visit with ENT giving recurrent infections.  Patient voices understanding.

## 2014-01-27 NOTE — Patient Instructions (Signed)
Please take antibiotic as directed with food.  Use Tussionex for cough.  Continue allergy medication and inhalers. Increase your fluid intake.  Rest. Keep a humidifier in your bedroom.  I recommend you pay another visit to your ENT to re-discuss options to prevent further infections.   Sinusitis Sinusitis is redness, soreness, and swelling (inflammation) of the paranasal sinuses. Paranasal sinuses are air pockets within the bones of your face (beneath the eyes, the middle of the forehead, or above the eyes). In healthy paranasal sinuses, mucus is able to drain out, and air is able to circulate through them by way of your nose. However, when your paranasal sinuses are inflamed, mucus and air can become trapped. This can allow bacteria and other germs to grow and cause infection. Sinusitis can develop quickly and last only a short time (acute) or continue over a long period (chronic). Sinusitis that lasts for more than 12 weeks is considered chronic.  CAUSES  Causes of sinusitis include:  Allergies.  Structural abnormalities, such as displacement of the cartilage that separates your nostrils (deviated septum), which can decrease the air flow through your nose and sinuses and affect sinus drainage.  Functional abnormalities, such as when the small hairs (cilia) that line your sinuses and help remove mucus do not work properly or are not present. SYMPTOMS  Symptoms of acute and chronic sinusitis are the same. The primary symptoms are pain and pressure around the affected sinuses. Other symptoms include:  Upper toothache.  Earache.  Headache.  Bad breath.  Decreased sense of smell and taste.  A cough, which worsens when you are lying flat.  Fatigue.  Fever.  Thick drainage from your nose, which often is green and may contain pus (purulent).  Swelling and warmth over the affected sinuses. DIAGNOSIS  Your caregiver will perform a physical exam. During the exam, your caregiver  may:  Look in your nose for signs of abnormal growths in your nostrils (nasal polyps).  Tap over the affected sinus to check for signs of infection.  View the inside of your sinuses (endoscopy) with a special imaging device with a light attached (endoscope), which is inserted into your sinuses. If your caregiver suspects that you have chronic sinusitis, one or more of the following tests may be recommended:  Allergy tests.  Nasal culture A sample of mucus is taken from your nose and sent to a lab and screened for bacteria.  Nasal cytology A sample of mucus is taken from your nose and examined by your caregiver to determine if your sinusitis is related to an allergy. TREATMENT  Most cases of acute sinusitis are related to a viral infection and will resolve on their own within 10 days. Sometimes medicines are prescribed to help relieve symptoms (pain medicine, decongestants, nasal steroid sprays, or saline sprays).  However, for sinusitis related to a bacterial infection, your caregiver will prescribe antibiotic medicines. These are medicines that will help kill the bacteria causing the infection.  Rarely, sinusitis is caused by a fungal infection. In theses cases, your caregiver will prescribe antifungal medicine. For some cases of chronic sinusitis, surgery is needed. Generally, these are cases in which sinusitis recurs more than 3 times per year, despite other treatments. HOME CARE INSTRUCTIONS   Drink plenty of water. Water helps thin the mucus so your sinuses can drain more easily.  Use a humidifier.  Inhale steam 3 to 4 times a day (for example, sit in the bathroom with the shower running).  Apply a warm,  moist washcloth to your face 3 to 4 times a day, or as directed by your caregiver.  Use saline nasal sprays to help moisten and clean your sinuses.  Take over-the-counter or prescription medicines for pain, discomfort, or fever only as directed by your caregiver. SEEK IMMEDIATE  MEDICAL CARE IF:  You have increasing pain or severe headaches.  You have nausea, vomiting, or drowsiness.  You have swelling around your face.  You have vision problems.  You have a stiff neck.  You have difficulty breathing. MAKE SURE YOU:   Understand these instructions.  Will watch your condition.  Will get help right away if you are not doing well or get worse. Document Released: 08/04/2005 Document Revised: 10/27/2011 Document Reviewed: 08/19/2011 [facility] Patient Information 2014 [place], [place].

## 2014-01-30 ENCOUNTER — Other Ambulatory Visit: Payer: Self-pay | Admitting: Family Medicine

## 2014-02-15 ENCOUNTER — Telehealth: Payer: Self-pay | Admitting: Family Medicine

## 2014-02-15 DIAGNOSIS — R51 Headache: Secondary | ICD-10-CM

## 2014-02-15 NOTE — Telephone Encounter (Signed)
Patient left message requesting refill on Hydrocodone, call when ready for pick up

## 2014-02-16 NOTE — Telephone Encounter (Signed)
Last refill 01/11/2014. Last OV 01/27/2014

## 2014-02-18 NOTE — Telephone Encounter (Signed)
OK to refill Hydrocodone 

## 2014-02-20 MED ORDER — HYDROCODONE-ACETAMINOPHEN 5-325 MG PO TABS: 1.0000 | ORAL_TABLET | Freq: Four times a day (QID) | ORAL | Status: DC | PRN

## 2014-02-20 NOTE — Telephone Encounter (Signed)
RX printed and gave to md to sign  Pt informed that rx is ready to pick up

## 2014-03-01 ENCOUNTER — Telehealth: Payer: Self-pay | Admitting: Family Medicine

## 2014-03-01 NOTE — Telephone Encounter (Signed)
Called patient to inform her, she declines appointment at this time will try OTC drops and if it does not get better she will call to schedule appointment

## 2014-03-01 NOTE — Telephone Encounter (Signed)
I typically do not call in antibiotics for patient without evaluating them in clinic.  Conjunctivitis can be viral in nature and not necessarily a bacterial infection.  Also need to make sure there is not a scratch on her cornea or other cause for her symptoms.  I recommend she be seen.  In the mean time she should apply warm compresses to the affected eye.  Can use lubricating drops as well.

## 2014-03-01 NOTE — Telephone Encounter (Signed)
Patient is requesting to have something called in for pink eye that she got from her grandchildren, advised patient Dr. Charlett Blake is out of the office and she would need an appointment and she requested that I ask Einar Pheasant is he would call something in for her

## 2014-03-02 ENCOUNTER — Other Ambulatory Visit: Payer: Self-pay | Admitting: Family Medicine

## 2014-03-10 ENCOUNTER — Encounter: Payer: Self-pay | Admitting: Family Medicine

## 2014-03-10 ENCOUNTER — Ambulatory Visit (INDEPENDENT_AMBULATORY_CARE_PROVIDER_SITE_OTHER): Payer: BC Managed Care – PPO | Admitting: Family Medicine

## 2014-03-10 VITALS — BP 130/80 | HR 74 | Temp 98.4°F | Ht 65.0 in | Wt 160.1 lb

## 2014-03-10 DIAGNOSIS — F5105 Insomnia due to other mental disorder: Secondary | ICD-10-CM | POA: Insufficient documentation

## 2014-03-10 DIAGNOSIS — F99 Mental disorder, not otherwise specified: Secondary | ICD-10-CM | POA: Insufficient documentation

## 2014-03-10 DIAGNOSIS — I10 Essential (primary) hypertension: Secondary | ICD-10-CM

## 2014-03-10 DIAGNOSIS — R5381 Other malaise: Secondary | ICD-10-CM

## 2014-03-10 DIAGNOSIS — E785 Hyperlipidemia, unspecified: Secondary | ICD-10-CM

## 2014-03-10 DIAGNOSIS — K219 Gastro-esophageal reflux disease without esophagitis: Secondary | ICD-10-CM

## 2014-03-10 DIAGNOSIS — K902 Blind loop syndrome, not elsewhere classified: Secondary | ICD-10-CM

## 2014-03-10 DIAGNOSIS — Z1211 Encounter for screening for malignant neoplasm of colon: Secondary | ICD-10-CM

## 2014-03-10 DIAGNOSIS — R159 Full incontinence of feces: Secondary | ICD-10-CM

## 2014-03-10 DIAGNOSIS — R5383 Other fatigue: Secondary | ICD-10-CM

## 2014-03-10 DIAGNOSIS — G4700 Insomnia, unspecified: Secondary | ICD-10-CM

## 2014-03-10 DIAGNOSIS — M25579 Pain in unspecified ankle and joints of unspecified foot: Secondary | ICD-10-CM

## 2014-03-10 LAB — CBC WITH DIFFERENTIAL/PLATELET
Basophils Absolute: 0 10*3/uL (ref 0.0–0.1)
Basophils Relative: 0 % (ref 0–1)
EOS ABS: 0.1 10*3/uL (ref 0.0–0.7)
Eosinophils Relative: 1 % (ref 0–5)
HCT: 38 % (ref 36.0–46.0)
Hemoglobin: 13 g/dL (ref 12.0–15.0)
Lymphocytes Relative: 28 % (ref 12–46)
Lymphs Abs: 2.2 10*3/uL (ref 0.7–4.0)
MCH: 31.1 pg (ref 26.0–34.0)
MCHC: 34.2 g/dL (ref 30.0–36.0)
MCV: 90.9 fL (ref 78.0–100.0)
Monocytes Absolute: 0.7 10*3/uL (ref 0.1–1.0)
Monocytes Relative: 9 % (ref 3–12)
NEUTROS PCT: 62 % (ref 43–77)
Neutro Abs: 5 10*3/uL (ref 1.7–7.7)
Platelets: 358 10*3/uL (ref 150–400)
RBC: 4.18 MIL/uL (ref 3.87–5.11)
RDW: 13.6 % (ref 11.5–15.5)
WBC: 8 10*3/uL (ref 4.0–10.5)

## 2014-03-10 LAB — TSH: TSH: 0.751 u[IU]/mL (ref 0.350–4.500)

## 2014-03-10 LAB — URIC ACID: Uric Acid, Serum: 5.8 mg/dL (ref 2.4–7.0)

## 2014-03-10 LAB — LIPID PANEL
Cholesterol: 189 mg/dL (ref 0–200)
HDL: 53 mg/dL (ref >39)
LDL Cholesterol: 107 mg/dL — ABNORMAL HIGH (ref 0–99)
Total CHOL/HDL Ratio: 3.6 Ratio
Triglycerides: 147 mg/dL (ref <150)
VLDL: 29 mg/dL (ref 0–40)

## 2014-03-10 LAB — RENAL FUNCTION PANEL
ALBUMIN: 3.9 g/dL (ref 3.5–5.2)
BUN: 7 mg/dL (ref 6–23)
CALCIUM: 9.1 mg/dL (ref 8.4–10.5)
CO2: 30 mEq/L (ref 19–32)
CREATININE: 0.6 mg/dL (ref 0.50–1.10)
Chloride: 103 mEq/L (ref 96–112)
GLUCOSE: 92 mg/dL (ref 70–99)
Phosphorus: 3.2 mg/dL (ref 2.3–4.6)
Potassium: 4.1 mEq/L (ref 3.5–5.3)
Sodium: 139 mEq/L (ref 135–145)

## 2014-03-10 LAB — HEPATIC FUNCTION PANEL
ALBUMIN: 3.9 g/dL (ref 3.5–5.2)
ALT: 16 U/L (ref 0–35)
AST: 15 U/L (ref 0–37)
Alkaline Phosphatase: 97 U/L (ref 39–117)
Bilirubin, Direct: 0.1 mg/dL (ref 0.0–0.3)
Indirect Bilirubin: 0.3 mg/dL (ref 0.2–1.2)
Total Bilirubin: 0.4 mg/dL (ref 0.2–1.2)
Total Protein: 6.6 g/dL (ref 6.0–8.3)

## 2014-03-10 MED ORDER — HYDROCODONE-ACETAMINOPHEN 10-325 MG PO TABS: 1.0000 | ORAL_TABLET | Freq: Three times a day (TID) | ORAL | Status: DC | PRN

## 2014-03-10 MED ORDER — ZOLPIDEM TARTRATE 10 MG PO TABS
10.0000 mg | ORAL_TABLET | Freq: Every evening | ORAL | Status: DC | PRN
Start: 2014-03-10 — End: 2014-06-22

## 2014-03-10 NOTE — Progress Notes (Signed)
Pre visit review using our clinic review tool, if applicable. No additional management support is needed unless otherwise documented below in the visit note. 

## 2014-03-10 NOTE — Progress Notes (Signed)
Patient ID: Megan [name], female   DOB: Oct 07, 1950, 63 y.o.   MRN: [id number] [facility] [id number] 1951/04/09 03/10/2014      Progress Note-Follow Up  Subjective  Chief Complaint  Chief Complaint  Patient presents with  . feet pain    both feet X weeks    HPI  Patient is a 63 year old femal in today for routine medical care. Here today to discuss new onset of b/l foot pain. Her left foot started hurting along the lateral aspect about 2 weeks ago. Denies injury, back pain, fevers etc. The left foot did not have any erythema or warmth associated with it. The left foot is now better but now her right heel hurts. She notes the right heel has warmth or redness associated. The pain in the right heel is worse in the evening. When the left foot pain was present at her to 24/7. Did note some trouble with paresthesias and numbness in her lower legs before this pain started but that has also resolved. Denies any back pain that is worsening. Has been using hydrocodone for the pain with good relief but 10 mg is necessary. Denies CP/palp/SOB/HA/congestion/fevers/GU c/o. Taking meds as prescribed. Has noted some occasional episodes of fecal incontinence without warning, has had this trouble in the past but not for a while. No melena or bloody stool.  Past Medical History  Diagnosis Date  . GERD (gastroesophageal reflux disease)   . Depression   . Anxiety   . Insomnia   . IBS (irritable bowel syndrome)   . Diverticulosis   . Barrett esophagus   . Hiatal hernia   . Unspecified menopausal and postmenopausal disorder   . Vitamin B12 deficiency   . Blind loop syndrome   . Hepatic cyst   . Unspecified hypothyroidism   . Diarrhea   . Family history of malignant neoplasm of gastrointestinal tract     multiple members  . Endometrial hyperplasia 02/27/2006    BENIGN ENDO BX ON 02/2007  . C. difficile diarrhea   . Cystocele, midline   . Rectocele   . Uterine prolapse without mention of  vaginal wall prolapse   . Chest pain, atypical 07/14/2011  . Leaky heart valve   . Hiatal hernia   . 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 Thrush 07/21/2011  . Rectal bleeding 10/10/2011  . Sinusitis acute 02/03/2012  . Contact dermatitis 03/23/2012  . Headache(784.0) 04/15/2012  . 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 Hematuria 04/27/2012  . Allergic state 12/31/2012  . 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 Paronychia of great toe, left 06/19/2013  . Low back pain 08/15/2013  . Osteoporosis 05/2011    -2.5 Lt femoral neck    Past Surgical History  Procedure Laterality Date  . Appendectomy    . Uterine fibroid surgery    . Hysteroscopy      POLYP    Family History  Problem Relation Age of Onset  . Colon cancer Mother   . Diabetes Mother   . Hypertension Mother   . Colon polyps Father   . Colon cancer Maternal Aunt   . Colon cancer Maternal Grandmother   . Breast cancer Maternal Grandmother   . Diabetes Paternal Grandmother   . Breast cancer Paternal Grandmother   . Colon cancer Cousin     X3  . Ovarian cancer Cousin     History   Social History  . Marital Status: Married    Spouse Name: N/A    Number of Children:  4  . Years of Education: N/A   Occupational History  . Retired    Social History Main Topics  . Smoking status: Former Smoker    Quit date: 08/19/1995  . Smokeless tobacco: Never Used  . Alcohol Use: No  . Drug Use: No  . Sexual Activity: No   Other Topics Concern  . Not on file   Social History Narrative  . No narrative on file    Current Outpatient Prescriptions on File Prior to Visit  Medication Sig Dispense Refill  . albuterol (VENTOLIN HFA) 108 (90 BASE) MCG/ACT inhaler INHALE 2 PUFFS BY MOUTH EVERY 6 HOURS AS NEEDED  18 each  1  . carvedilol (COREG) 12.5 MG tablet Take 1 tablet (12.5 mg total) by mouth 2 (two) times daily with a meal.  180 tablet  3  . cyclobenzaprine (FLEXERIL) 10 MG tablet Take 1 tablet (10 mg total) by mouth 3 (three) times  daily as needed for muscle spasms.  40 tablet  1  . diazepam (VALIUM) 10 MG tablet Take 1 tablet (10 mg total) by mouth every 12 (twelve) hours as needed for anxiety or sleep.  60 tablet  2  . diltiazem (CARDIZEM CD) 180 MG 24 hr capsule Take 1 capsule (180 mg total) by mouth daily.  90 capsule  3  . FLUoxetine (PROZAC) 20 MG tablet Take 1 tablet (20 mg total) by mouth daily.  30 tablet  3  . fluticasone (FLONASE) 50 MCG/ACT nasal spray Place 2 sprays into the nose daily.  16 g  6  . hydrochlorothiazide (HYDRODIURIL) 25 MG tablet TAKE 1 TABLET BY MOUTH EVERY DAY  30 tablet  0  . HYDROcodone-acetaminophen (NORCO/VICODIN) 5-325 MG per tablet Take 1 tablet by mouth every 6 (six) hours as needed.  70 tablet  0  . hydrocortisone (ANUSOL-HC) 25 MG suppository Place 1 suppository (25 mg total) rectally at bedtime as needed for hemorrhoids. X 7 days then prn  20 suppository  1  . hyoscyamine (LEVSIN SL) 0.125 MG SL tablet Place 1 tablet (0.125 mg total) under the tongue every 4 (four) hours as needed for cramping.  20 tablet  0  . lactobacillus acidophilus (BACID) TABS tablet Take 2 tablets by mouth 3 (three) times daily.      . methocarbamol (ROBAXIN) 500 MG tablet Take 2 tablets (1,000 mg total) by mouth 3 (three) times daily as needed for muscle spasms.  20 tablet  0  . promethazine (PHENERGAN) 25 MG tablet Take 1 tablet (25 mg total) by mouth 3 (three) times daily as needed for nausea.  30 tablet  2  . zolpidem (AMBIEN) 10 MG tablet TAKE 1/2-1 TABLET BY MOUTH EVERY NIGHT AT BEDTIME AS NEEDED  15 tablet  0   No current facility-administered medications on file prior to visit.    Allergies  Allergen Reactions  . Sulfamethoxazole Shortness Of Breath    chest tightness  . Iohexol   . Iodinated Diagnostic Agents     Pt states at Dr.Grapeys office 15 years ago blacked out for 2 hours during injection for IVP. Was told never to have IV dye again.     Review of Systems  Review of Systems   Constitutional: Negative for fever and malaise/fatigue.  HENT: Negative for congestion.   Eyes: Negative for discharge.  Respiratory: Negative for shortness of breath.   Cardiovascular: Negative for chest pain, palpitations and leg swelling.  Gastrointestinal: Negative for nausea, abdominal pain and diarrhea.  Genitourinary: Negative for  dysuria.  Musculoskeletal: Positive for joint pain. Negative for falls.  Skin: Negative for rash.  Neurological: Negative for loss of consciousness and headaches.  Endo/Heme/Allergies: Negative for polydipsia.  Psychiatric/Behavioral: Negative for depression and suicidal ideas. The patient is not nervous/anxious and does not have insomnia.     Objective  BP 130/80  Pulse 74  Temp(Src) 98.4 F (36.9 C) (Oral)  Ht 5\' 5"  (1.651 m)  Wt 160 lb 1.9 oz (72.63 kg)  BMI 26.65 kg/m2  SpO2 97%  Physical Exam  Physical Exam  Constitutional: She is oriented to person, place, and time and well-developed, well-nourished, and in no distress. No distress.  HENT:  Head: Normocephalic and atraumatic.  Eyes: Conjunctivae are normal.  Neck: Neck supple. No thyromegaly present.  Cardiovascular: Normal rate, regular rhythm and normal heart sounds.   No murmur heard. Pulmonary/Chest: Effort normal and breath sounds normal. She has no wheezes.  Abdominal: She exhibits no distension and no mass.  Musculoskeletal: She exhibits no edema.  Lymphadenopathy:    She has no cervical adenopathy.  Neurological: She is alert and oriented to person, place, and time.  Skin: Skin is warm and dry. No rash noted. She is not diaphoretic.  Psychiatric: Memory, affect and judgment normal.    Lab Results  Component Value Date   TSH 0.680 10/27/2013   Lab Results  Component Value Date   WBC 8.1 10/27/2013   HGB 13.5 10/27/2013   HCT 39.5 10/27/2013   MCV 90.0 10/27/2013   PLT 325 10/27/2013   Lab Results  Component Value Date   CREATININE 0.52 10/27/2013   BUN 5* 10/27/2013    NA 141 10/27/2013   K 3.9 10/27/2013   CL 104 10/27/2013   CO2 29 10/27/2013   Lab Results  Component Value Date   ALT 16 10/27/2013   AST 15 10/27/2013   ALKPHOS 88 10/27/2013   BILITOT 0.2 10/27/2013   Lab Results  Component Value Date   CHOL 218* 06/30/2011   Lab Results  Component Value Date   HDL 55.80 06/30/2011   Lab Results  Component Value Date   LDLCALC 112 11/21/2010   Lab Results  Component Value Date   TRIG 80.0 06/30/2011   Lab Results  Component Value Date   CHOLHDL 4 06/30/2011     Assessment & Plan   HYPERTENSION Well controlled, no changes to meds. Encouraged heart healthy diet such as the DASH diet and exercise as tolerated.   BLIND LOOP SYNDROME Recent intermittent episodes of fecal incontinence. Encouraged Hardin Negus [facility] probiotics, avoid offending foods and referred back to her gastroenterologist at [facility]  GERD Avoid offending foods, start probiotics. Do not eat large meals in late evening and consider raising head of bed.   Other malaise and fatigue Persistent despite sleeping with use Ambien use, will recheck labs.  Insomnia Good response to Ambien refilled today  Pain in joint, ankle and foot B/l foot pain. Left now right. No obvious trauma will check uric acid level encouraged ice, aspercreme, stretching and good shoes and call if no improvement.

## 2014-03-10 NOTE — Assessment & Plan Note (Signed)
B/l foot pain. Left now right. No obvious trauma will check uric acid level encouraged ice, aspercreme, stretching and good shoes and call if no improvement.

## 2014-03-10 NOTE — Assessment & Plan Note (Signed)
Persistent despite sleeping with use Ambien use, will recheck labs.

## 2014-03-10 NOTE — Assessment & Plan Note (Signed)
Recent intermittent episodes of fecal incontinence. Encouraged Hardin Neg[facility] probiotics, avoid offending foods and referred back to her gastroenterologist at [facility]

## 2014-03-10 NOTE — Assessment & Plan Note (Signed)
Good response to Ambien refilled today

## 2014-03-10 NOTE — Assessment & Plan Note (Signed)
Avoid offending foods, start probiotics. Do not eat large meals in late evening and consider raising head of bed.  

## 2014-03-10 NOTE — Assessment & Plan Note (Signed)
Well controlled, no changes to meds. Encouraged heart healthy diet such as the DASH diet and exercise as tolerated.  °

## 2014-03-10 NOTE — Patient Instructions (Signed)

## 2014-03-18 ENCOUNTER — Other Ambulatory Visit: Payer: Self-pay | Admitting: Family Medicine

## 2014-03-22 ENCOUNTER — Other Ambulatory Visit: Payer: Self-pay | Admitting: Family Medicine

## 2014-03-23 NOTE — Telephone Encounter (Signed)
RX printed for MD to sign and fax  Last RX was done on 06-28-13 quantity 60 with 2 refills

## 2014-04-07 ENCOUNTER — Other Ambulatory Visit: Payer: Self-pay | Admitting: Cardiovascular Disease

## 2014-04-10 ENCOUNTER — Other Ambulatory Visit: Payer: Self-pay | Admitting: Family Medicine

## 2014-04-11 ENCOUNTER — Other Ambulatory Visit: Payer: Self-pay

## 2014-04-11 DIAGNOSIS — M25579 Pain in unspecified ankle and joints of unspecified foot: Secondary | ICD-10-CM

## 2014-04-11 MED ORDER — HYDROCODONE-ACETAMINOPHEN 10-325 MG PO TABS: 1.0000 | ORAL_TABLET | Freq: Three times a day (TID) | ORAL | Status: DC | PRN

## 2014-04-11 NOTE — Telephone Encounter (Signed)
Pt left a message stating she needs her Hydrocodone refilled?  Last RX was done on 03-10-14 quantity 50 with 0 refills  RX printed for md to sign and pt notified

## 2014-04-17 ENCOUNTER — Encounter: Payer: Self-pay | Admitting: Family Medicine

## 2014-04-18 ENCOUNTER — Other Ambulatory Visit: Payer: Self-pay | Admitting: Family Medicine

## 2014-04-19 ENCOUNTER — Other Ambulatory Visit: Payer: Self-pay | Admitting: Family Medicine

## 2014-04-19 DIAGNOSIS — F418 Other specified anxiety disorders: Secondary | ICD-10-CM

## 2014-04-19 MED ORDER — CITALOPRAM HYDROBROMIDE 40 MG PO TABS: 40.0000 mg | ORAL_TABLET | Freq: Every day | ORAL | Status: DC

## 2014-04-20 ENCOUNTER — Other Ambulatory Visit: Payer: Self-pay | Admitting: Family Medicine

## 2014-04-20 ENCOUNTER — Telehealth: Payer: Self-pay

## 2014-04-20 DIAGNOSIS — F418 Other specified anxiety disorders: Secondary | ICD-10-CM

## 2014-04-20 MED ORDER — FLUOXETINE HCL 40 MG PO CAPS: 40.0000 mg | ORAL_CAPSULE | Freq: Every day | ORAL | Status: DC

## 2014-04-20 NOTE — Telephone Encounter (Signed)
I think I had to reorder and I ordered the wrong one. I have sent the correct one in now

## 2014-04-20 NOTE — Telephone Encounter (Signed)
We refilled pts Celexa 40mg  but [facility] pharmacy called and states pt was expecting her Prozac to be refilled at 40 mg? It states that pt takes 20 mg  Please advise?

## 2014-04-21 ENCOUNTER — Other Ambulatory Visit (HOSPITAL_COMMUNITY): Payer: BC Managed Care – PPO

## 2014-04-21 ENCOUNTER — Ambulatory Visit (HOSPITAL_COMMUNITY)
Admission: RE | Admit: 2014-04-21 | Discharge: 2014-04-21 | Disposition: A | Discharge status: Home / Self Care | Payer: BC Managed Care – PPO | Source: Ambulatory Visit | Attending: Cardiovascular Disease | Admitting: Cardiovascular Disease

## 2014-04-21 DIAGNOSIS — F17200 Nicotine dependence, unspecified, uncomplicated: Secondary | ICD-10-CM | POA: Insufficient documentation

## 2014-04-21 DIAGNOSIS — I369 Nonrheumatic tricuspid valve disorder, unspecified: Secondary | ICD-10-CM | POA: Insufficient documentation

## 2014-04-21 DIAGNOSIS — I10 Essential (primary) hypertension: Secondary | ICD-10-CM | POA: Insufficient documentation

## 2014-04-21 DIAGNOSIS — E785 Hyperlipidemia, unspecified: Secondary | ICD-10-CM | POA: Insufficient documentation

## 2014-04-21 DIAGNOSIS — I340 Nonrheumatic mitral (valve) insufficiency: Secondary | ICD-10-CM

## 2014-04-21 DIAGNOSIS — I071 Rheumatic tricuspid insufficiency: Secondary | ICD-10-CM

## 2014-04-21 NOTE — Progress Notes (Signed)
2D Echocardiogram Complete.  04/21/2014   Tyniya Kuyper, RDCS

## 2014-04-28 ENCOUNTER — Ambulatory Visit: Payer: BC Managed Care – PPO | Admitting: Cardiovascular Disease

## 2014-05-07 ENCOUNTER — Other Ambulatory Visit: Payer: Self-pay | Admitting: Cardiovascular Disease

## 2014-05-10 ENCOUNTER — Telehealth: Payer: Self-pay | Admitting: Family Medicine

## 2014-05-10 DIAGNOSIS — M25579 Pain in unspecified ankle and joints of unspecified foot: Secondary | ICD-10-CM

## 2014-05-10 NOTE — Telephone Encounter (Signed)
Pt is needing new rx HYDROcodone-acetaminophen (NORCO) 10-325 MG per tablet Please call when available for pick up.

## 2014-05-11 MED ORDER — HYDROCODONE-ACETAMINOPHEN 10-325 MG PO TABS: 1.0000 | ORAL_TABLET | Freq: Three times a day (TID) | ORAL | Status: DC | PRN

## 2014-05-11 NOTE — Telephone Encounter (Signed)
Last RX done on 04-11-14 quantity 50 with 0 refills  rx printed. Lm

## 2014-05-15 ENCOUNTER — Other Ambulatory Visit: Payer: Self-pay | Admitting: Family Medicine

## 2014-05-22 ENCOUNTER — Ambulatory Visit (INDEPENDENT_AMBULATORY_CARE_PROVIDER_SITE_OTHER): Payer: Self-pay | Admitting: Family Medicine

## 2014-05-22 ENCOUNTER — Encounter: Payer: Self-pay | Admitting: Family Medicine

## 2014-05-22 VITALS — BP 136/72 | HR 80 | Temp 98.4°F | Ht 65.0 in | Wt 160.2 lb

## 2014-05-22 DIAGNOSIS — Z0000 Encounter for general adult medical examination without abnormal findings: Secondary | ICD-10-CM

## 2014-05-22 DIAGNOSIS — K219 Gastro-esophageal reflux disease without esophagitis: Secondary | ICD-10-CM

## 2014-05-22 DIAGNOSIS — R143 Flatulence: Secondary | ICD-10-CM

## 2014-05-22 DIAGNOSIS — K862 Cyst of pancreas: Secondary | ICD-10-CM

## 2014-05-22 DIAGNOSIS — K902 Blind loop syndrome, not elsewhere classified: Secondary | ICD-10-CM

## 2014-05-22 DIAGNOSIS — R197 Diarrhea, unspecified: Secondary | ICD-10-CM

## 2014-05-22 DIAGNOSIS — R142 Eructation: Secondary | ICD-10-CM

## 2014-05-22 DIAGNOSIS — K7689 Other specified diseases of liver: Secondary | ICD-10-CM | POA: Insufficient documentation

## 2014-05-22 DIAGNOSIS — I10 Essential (primary) hypertension: Secondary | ICD-10-CM

## 2014-05-22 DIAGNOSIS — K5790 Diverticulosis of intestine, part unspecified, without perforation or abscess without bleeding: Secondary | ICD-10-CM

## 2014-05-22 DIAGNOSIS — M545 Low back pain: Secondary | ICD-10-CM

## 2014-05-22 DIAGNOSIS — Z23 Encounter for immunization: Secondary | ICD-10-CM

## 2014-05-22 DIAGNOSIS — M25579 Pain in unspecified ankle and joints of unspecified foot: Secondary | ICD-10-CM

## 2014-05-22 DIAGNOSIS — D3500 Benign neoplasm of unspecified adrenal gland: Secondary | ICD-10-CM | POA: Insufficient documentation

## 2014-05-22 DIAGNOSIS — K449 Diaphragmatic hernia without obstruction or gangrene: Secondary | ICD-10-CM

## 2014-05-22 DIAGNOSIS — E278 Other specified disorders of adrenal gland: Secondary | ICD-10-CM

## 2014-05-22 DIAGNOSIS — R141 Gas pain: Secondary | ICD-10-CM

## 2014-05-22 HISTORY — DX: Benign neoplasm of unspecified adrenal gland: D35.00

## 2014-05-22 HISTORY — DX: Other specified disorders of adrenal gland: E27.8

## 2014-05-22 LAB — URINALYSIS, ROUTINE W REFLEX MICROSCOPIC
Bilirubin Urine: NEGATIVE
Ketones, ur: NEGATIVE
Nitrite: NEGATIVE
Specific Gravity, Urine: 1.015 (ref 1.000–1.030)
Total Protein, Urine: NEGATIVE
URINE GLUCOSE: NEGATIVE
UROBILINOGEN UA: 0.2 (ref 0.0–1.0)
pH: 5.5 (ref 5.0–8.0)

## 2014-05-22 LAB — CBC WITH DIFFERENTIAL/PLATELET
Basophils Absolute: 0 10*3/uL (ref 0.0–0.1)
Basophils Relative: 0.4 % (ref 0.0–3.0)
EOS ABS: 0.1 10*3/uL (ref 0.0–0.7)
Eosinophils Relative: 1.5 % (ref 0.0–5.0)
HCT: 42.8 % (ref 36.0–46.0)
Hemoglobin: 14.1 g/dL (ref 12.0–15.0)
LYMPHS PCT: 28.5 % (ref 12.0–46.0)
Lymphs Abs: 2.1 10*3/uL (ref 0.7–4.0)
MCHC: 32.9 g/dL (ref 30.0–36.0)
MCV: 94.5 fl (ref 78.0–100.0)
MONO ABS: 0.6 10*3/uL (ref 0.1–1.0)
Monocytes Relative: 7.8 % (ref 3.0–12.0)
NEUTROS ABS: 4.5 10*3/uL (ref 1.4–7.7)
Neutrophils Relative %: 61.8 % (ref 43.0–77.0)
Platelets: 340 10*3/uL (ref 150.0–400.0)
RBC: 4.53 Mil/uL (ref 3.87–5.11)
RDW: 13.4 % (ref 11.5–15.5)
WBC: 7.3 10*3/uL (ref 4.0–10.5)

## 2014-05-22 LAB — RENAL FUNCTION PANEL
Albumin: 4.2 g/dL (ref 3.5–5.2)
BUN: 6 mg/dL (ref 6–23)
CO2: 26 mEq/L (ref 19–32)
Calcium: 9 mg/dL (ref 8.4–10.5)
Chloride: 103 mEq/L (ref 96–112)
Creatinine, Ser: 0.7 mg/dL (ref 0.4–1.2)
GFR: 95.88 mL/min (ref >60.00)
GLUCOSE: 91 mg/dL (ref 70–99)
PHOSPHORUS: 3.6 mg/dL (ref 2.3–4.6)
Potassium: 3.5 mEq/L (ref 3.5–5.1)
Sodium: 138 mEq/L (ref 135–145)

## 2014-05-22 LAB — SEDIMENTATION RATE: Sed Rate: 14 mm/hr (ref 0–22)

## 2014-05-22 MED ORDER — HYDROCODONE-ACETAMINOPHEN 10-325 MG PO TABS: 1.0000 | ORAL_TABLET | Freq: Three times a day (TID) | ORAL | Status: DC | PRN

## 2014-05-22 MED ORDER — CHOLESTYRAMINE 4 G PO PACK
4.0000 g | PACK | Freq: Two times a day (BID) | ORAL | Status: DC
Start: 2014-05-22 — End: 2014-12-12

## 2014-05-22 NOTE — Patient Instructions (Signed)
Diverticulosis Diverticulosis is the condition that develops when small pouches (diverticula) form in the wall of your colon. Your colon, or large intestine, is where water is absorbed and stool is formed. The pouches form when the inside layer of your colon pushes through weak spots in the outer layers of your colon. CAUSES  No one knows exactly what causes diverticulosis. RISK FACTORS  Being older than 50. Your risk for this condition increases with age. Diverticulosis is rare in people younger than 40 years. By age 80, almost everyone has it.  Eating a low-fiber diet.  Being frequently constipated.  Being overweight.  Not getting enough exercise.  Smoking.  Taking over-the-counter pain medicines, like aspirin and ibuprofen. SYMPTOMS  Most people with diverticulosis do not have symptoms. DIAGNOSIS  Because diverticulosis often has no symptoms, health care providers often discover the condition during an exam for other colon problems. In many cases, a health care provider will diagnose diverticulosis while using a flexible scope to examine the colon (colonoscopy). TREATMENT  If you have never developed an infection related to diverticulosis, you may not need treatment. If you have had an infection before, treatment may include:  Eating more fruits, vegetables, and grains.  Taking a fiber supplement.  Taking a live bacteria supplement (probiotic).  Taking medicine to relax your colon. HOME CARE INSTRUCTIONS   Drink at least 6-8 glasses of water each day to prevent constipation.  Try not to strain when you have a bowel movement.  Keep all follow-up appointments. If you have had an infection before:  Increase the fiber in your diet as directed by your health care provider or dietitian.  Take a dietary fiber supplement if your health care provider approves.  Only take medicines as directed by your health care provider. SEEK MEDICAL CARE IF:   You have abdominal  pain.  You have bloating.  You have cramps.  You have not gone to the bathroom in 3 days. SEEK IMMEDIATE MEDICAL CARE IF:   Your pain gets worse.  Yourbloating becomes very bad.  You have a fever or chills, and your symptoms suddenly get worse.  You begin vomiting.  You have bowel movements that are bloody or black. MAKE SURE YOU:  Understand these instructions.  Will watch your condition.  Will get help right away if you are not doing well or get worse. Document Released: 05/01/2004 Document Revised: 08/09/2013 Document Reviewed: 06/29/2013 [employer] Patient Information 2015 [employer]. This information is not intended to replace advice given to you by your health care provider. Make sure you discuss any questions you have with your health care provider.  

## 2014-05-22 NOTE — Progress Notes (Signed)
Pre visit review using our clinic review tool, if applicable. No additional management support is needed unless otherwise documented below in the visit note. 

## 2014-05-23 ENCOUNTER — Other Ambulatory Visit: Payer: Self-pay | Admitting: Family Medicine

## 2014-05-23 DIAGNOSIS — Z888 Allergy status to other drugs, medicaments and biological substances status: Secondary | ICD-10-CM

## 2014-05-23 LAB — URINE CULTURE
Colony Count: NO GROWTH
ORGANISM ID, BACTERIA: NO GROWTH

## 2014-05-23 MED ORDER — PREDNISONE 50 MG PO TABS
ORAL_TABLET | ORAL | Status: DC
Start: 2014-05-23 — End: 2014-09-11

## 2014-05-25 ENCOUNTER — Ambulatory Visit (HOSPITAL_BASED_OUTPATIENT_CLINIC_OR_DEPARTMENT_OTHER)
Admission: RE | Admit: 2014-05-25 | Discharge: 2014-05-25 | Disposition: A | Discharge status: Home / Self Care | Payer: Self-pay | Source: Ambulatory Visit | Attending: Family Medicine | Admitting: Family Medicine

## 2014-05-25 ENCOUNTER — Encounter (HOSPITAL_BASED_OUTPATIENT_CLINIC_OR_DEPARTMENT_OTHER): Payer: Self-pay

## 2014-05-25 DIAGNOSIS — R141 Gas pain: Secondary | ICD-10-CM | POA: Insufficient documentation

## 2014-05-25 DIAGNOSIS — K219 Gastro-esophageal reflux disease without esophagitis: Secondary | ICD-10-CM | POA: Insufficient documentation

## 2014-05-25 DIAGNOSIS — E278 Other specified disorders of adrenal gland: Secondary | ICD-10-CM | POA: Insufficient documentation

## 2014-05-25 DIAGNOSIS — K862 Cyst of pancreas: Secondary | ICD-10-CM | POA: Insufficient documentation

## 2014-05-25 DIAGNOSIS — K449 Diaphragmatic hernia without obstruction or gangrene: Secondary | ICD-10-CM | POA: Insufficient documentation

## 2014-05-25 DIAGNOSIS — K5790 Diverticulosis of intestine, part unspecified, without perforation or abscess without bleeding: Secondary | ICD-10-CM | POA: Insufficient documentation

## 2014-05-25 DIAGNOSIS — K7689 Other specified diseases of liver: Secondary | ICD-10-CM | POA: Insufficient documentation

## 2014-05-25 DIAGNOSIS — R197 Diarrhea, unspecified: Secondary | ICD-10-CM | POA: Insufficient documentation

## 2014-05-25 DIAGNOSIS — R143 Flatulence: Secondary | ICD-10-CM | POA: Insufficient documentation

## 2014-05-25 DIAGNOSIS — R142 Eructation: Secondary | ICD-10-CM | POA: Insufficient documentation

## 2014-05-25 DIAGNOSIS — K902 Blind loop syndrome, not elsewhere classified: Secondary | ICD-10-CM | POA: Insufficient documentation

## 2014-05-25 HISTORY — DX: Unspecified asthma, uncomplicated: J45.909

## 2014-05-25 HISTORY — DX: Essential (primary) hypertension: I10

## 2014-05-25 MED ORDER — IOHEXOL 300 MG/ML  SOLN
100.0000 mL | Freq: Once | INTRAMUSCULAR | Status: AC | PRN
  Administered 2014-05-25: 100 mL via INTRAVENOUS

## 2014-05-28 ENCOUNTER — Encounter: Payer: Self-pay | Admitting: Family Medicine

## 2014-05-28 NOTE — Progress Notes (Signed)
Patient ID: Megan [name], female   DOB: 1950/08/27, 63 y.o.   MRN: [id number] Megan [name] [id number] Sep 19, 1950 05/28/2014      Progress Note-Follow Up  Subjective  Chief Complaint  Chief Complaint  Patient presents with  . Follow-up    6 week  . Injections    flu    HPI  Patient is a 63 year old female in today for routine medical care. She continues to struggle with GI issues. Is just come off a course of Flagyl is feeling somewhat better. Follows with Dr. Lawrence Marseilles at [place] to have intermittent abdominal pain but no recent fevers or chills. Does have some nausea but no vomiting. Denies CP/palp/SOB/HA/congestion/fevers. Taking meds as prescribed  Past Medical History  Diagnosis Date  . GERD (gastroesophageal reflux disease)   . Depression   . Anxiety   . Insomnia   . IBS (irritable bowel syndrome)   . Diverticulosis   . Barrett esophagus   . Hiatal hernia   . Unspecified menopausal and postmenopausal disorder   . Vitamin B12 deficiency   . Blind loop syndrome   . Hepatic cyst   . Unspecified hypothyroidism   . Diarrhea   . Family history of malignant neoplasm of gastrointestinal tract     multiple members  . Endometrial hyperplasia 02/27/2006    BENIGN ENDO BX ON 02/2007  . C. difficile diarrhea   . Cystocele, midline   . Rectocele   . Uterine prolapse without mention of vaginal wall prolapse   . Chest pain, atypical 07/14/2011  . Leaky heart valve   . Hiatal hernia   . 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 Thrush 07/21/2011  . Rectal bleeding 10/10/2011  . Sinusitis acute 02/03/2012  . Contact dermatitis 03/23/2012  . Headache(784.0) 04/15/2012  . 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 Hematuria 04/27/2012  . Allergic state 12/31/2012  . 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 Paronychia of great toe, left 06/19/2013  . Low back pain 08/15/2013  . Osteoporosis 05/2011    -2.5 Lt femoral neck  . 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  Hypertension   . Asthma   . Adrenal adenoma 05/22/2014    Past Surgical History  Procedure Laterality Date  . Appendectomy    . Uterine fibroid surgery    . Hysteroscopy      POLYP    Family History  Problem Relation Age of Onset  . Colon cancer Mother   . Diabetes Mother   . Hypertension Mother   . Colon polyps Father   . Colon cancer Maternal Aunt   . Colon cancer Maternal Grandmother   . Breast cancer Maternal Grandmother   . Diabetes Paternal Grandmother   . Breast cancer Paternal Grandmother   . Colon cancer Cousin     X3  . Ovarian cancer Cousin     History   Social History  . Marital Status: Married    Spouse Name: N/A    Number of Children: 4  . Years of Education: N/A   Occupational History  . Retired    Social History Main Topics  . Smoking status: Former Smoker    Quit date: 08/19/1995  . Smokeless tobacco: Never Used  . Alcohol Use: No  . Drug Use: No  . Sexual Activity: No   Other Topics Concern  . Not on file   Social History Narrative  . No narrative on file    Current Outpatient Prescriptions on File Prior to Visit  Medication Sig Dispense Refill  . albuterol (VENTOLIN HFA) 108 (90 BASE) MCG/ACT inhaler INHALE 2 PUFFS BY MOUTH EVERY 6 HOURS AS NEEDED  18 each  1  . CARTIA XT 180 MG 24 hr capsule TAKE ONE CAPSULE BY MOUTH DAILY  30 capsule  0  . cyclobenzaprine (FLEXERIL) 10 MG tablet Take 1 tablet (10 mg total) by mouth 3 (three) times daily as needed for muscle spasms.  40 tablet  1  . diazepam (VALIUM) 10 MG tablet TAKE 1 TABLET BY MOUTH EVERY 12 HOURS AS NEEDED FOR ANXIETY OR SLEEP  60 tablet  0  . diltiazem (CARDIZEM CD) 180 MG 24 hr capsule Take 1 capsule (180 mg total) by mouth daily.  90 capsule  3  . fluticasone (FLONASE) 50 MCG/ACT nasal spray PLACE 2 SPRAYS INTO NOSE EVERY DAY  16 g  1  . hydrochlorothiazide (HYDRODIURIL) 25 MG tablet TAKE 1 TABLET BY MOUTH EVERY DAY  30 tablet  0  . hyoscyamine (LEVSIN SL) 0.125 MG SL tablet  Place 1 tablet (0.125 mg total) under the tongue every 4 (four) hours as needed for cramping.  20 tablet  0  . lactobacillus acidophilus (BACID) TABS tablet Take 2 tablets by mouth 3 (three) times daily.      . promethazine (PHENERGAN) 25 MG tablet TAKE 1 TABLET BY MOUTH THREE TIMES DAILY AS NEEDED FOR NAUSEA  30 tablet  0  . ranitidine (ZANTAC) 300 MG tablet TAKE 1 TABLET BY MOUTH ONCE DAILY AT BEDTIME  90 tablet  0  . zolpidem (AMBIEN) 10 MG tablet Take 1 tablet (10 mg total) by mouth at bedtime as needed for sleep.  15 tablet  2   No current facility-administered medications on file prior to visit.    Allergies  Allergen Reactions  . Sulfamethoxazole Shortness Of Breath    chest tightness  . Iohexol   . Iodinated Diagnostic Agents     Pt states at Dr.Grapeys office 15 years ago blacked out for 2 hours during injection for IVP. Was told never to have IV dye again.     Review of Systems  Review of Systems  Constitutional: Negative for fever and malaise/fatigue.  HENT: Negative for congestion.   Eyes: Negative for discharge.  Respiratory: Negative for shortness of breath.   Cardiovascular: Negative for chest pain, palpitations and leg swelling.  Gastrointestinal: Negative for nausea, abdominal pain and diarrhea.  Genitourinary: Negative for dysuria.  Musculoskeletal: Negative for falls.  Skin: Negative for rash.  Neurological: Negative for loss of consciousness and headaches.  Endo/Heme/Allergies: Negative for polydipsia.  Psychiatric/Behavioral: Negative for depression and suicidal ideas. The patient is not nervous/anxious and does not have insomnia.     Objective  BP 136/72  Pulse 80  Temp(Src) 98.4 F (36.9 C) (Oral)  Ht 5\' 5"  (1.651 m)  Wt 160 lb 3.2 oz (72.666 kg)  BMI 26.66 kg/m2  SpO2 99%  Physical Exam  Physical Exam  Constitutional: She is oriented to person, place, and time and well-developed, well-nourished, and in no distress. No distress.  HENT:  Head:  Normocephalic and atraumatic.  Eyes: Conjunctivae are normal.  Neck: Neck supple. No thyromegaly present.  Cardiovascular: Normal rate, regular rhythm and normal heart sounds.   No murmur heard. Pulmonary/Chest: Effort normal and breath sounds normal. She has no wheezes.  Abdominal: She exhibits no distension and no mass.  Musculoskeletal: She exhibits no edema.  Lymphadenopathy:    She has no cervical adenopathy.  Neurological: She is alert  and oriented to person, place, and time.  Skin: Skin is warm and dry. No rash noted. She is not diaphoretic.  Psychiatric: Memory, affect and judgment normal.    Lab Results  Component Value Date   TSH 0.751 03/10/2014   Lab Results  Component Value Date   WBC 7.3 05/22/2014   HGB 14.1 05/22/2014   HCT 42.8 05/22/2014   MCV 94.5 05/22/2014   PLT 340.0 05/22/2014   Lab Results  Component Value Date   CREATININE 0.7 05/22/2014   BUN 6 05/22/2014   NA 138 05/22/2014   K 3.5 05/22/2014   CL 103 05/22/2014   CO2 26 05/22/2014   Lab Results  Component Value Date   ALT 16 03/10/2014   AST 15 03/10/2014   ALKPHOS 97 03/10/2014   BILITOT 0.4 03/10/2014   Lab Results  Component Value Date   CHOL 189 03/10/2014   Lab Results  Component Value Date   HDL 53 03/10/2014   Lab Results  Component Value Date   LDLCALC 107* 03/10/2014   Lab Results  Component Value Date   TRIG 147 03/10/2014   Lab Results  Component Value Date   CHOLHDL 3.6 03/10/2014     Assessment & Plan  Pancreatic cyst No cyst seen on CT scan on 05/25/14  Essential hypertension Well controlled, no changes to meds. Encouraged heart healthy diet such as the DASH diet and exercise as tolerated.   GERD Avoid offending foods, start probiotics. Do not eat large meals in late evening and consider raising head of bed.   Hepatic cyst Benign appearing and stable.   Adrenal adenoma Stable and benign appearin gon current CT scan  Low back pain Encouraged moist heat and gentle  stretching as tolerated. May try NSAIDs and prescription meds as directed and report if symptoms worsen or seek immediate care

## 2014-05-28 NOTE — Assessment & Plan Note (Signed)
Avoid offending foods, start probiotics. Do not eat large meals in late evening and consider raising head of bed.  

## 2014-05-28 NOTE — Assessment & Plan Note (Signed)
No cyst seen on CT scan on 05/25/14

## 2014-05-28 NOTE — Assessment & Plan Note (Signed)
Benign appearing and stable.

## 2014-05-28 NOTE — Assessment & Plan Note (Signed)
Encouraged moist heat and gentle stretching as tolerated. May try NSAIDs and prescription meds as directed and report if symptoms worsen or seek immediate care 

## 2014-05-28 NOTE — Assessment & Plan Note (Signed)
Stable and benign appearin gon current CT scan

## 2014-05-28 NOTE — Assessment & Plan Note (Signed)
Well controlled, no changes to meds. Encouraged heart healthy diet such as the DASH diet and exercise as tolerated.  °

## 2014-05-29 ENCOUNTER — Telehealth: Payer: Self-pay | Admitting: Family Medicine

## 2014-05-29 NOTE — Telephone Encounter (Signed)
Caller name: Magdeline Relation to pt: self Call back number: [phone] Pharmacy:  Reason for call:   Patient would like a callback regarding CT results. She says that she saw results on mychart but does not know what it means

## 2014-05-29 NOTE — Telephone Encounter (Signed)
Pt informed: CT abdomen stable, hepatic and adrenal cysts unchanged.   Patient states she is still having pain and would like to know why? Please advise?

## 2014-05-29 NOTE — Telephone Encounter (Signed)
Would suggest that she double back around to gastroenterology. The pain is cramping from her bowels most likely she is welcome to come back in for reevaluation if worse

## 2014-05-31 NOTE — Telephone Encounter (Signed)
Notified pt. She states she contacted GI yesterday and they started her back on Flagyl due to her history of diverticulitis.

## 2014-06-07 ENCOUNTER — Other Ambulatory Visit: Payer: Self-pay | Admitting: Family Medicine

## 2014-06-19 ENCOUNTER — Encounter: Payer: Self-pay | Admitting: Family Medicine

## 2014-06-22 ENCOUNTER — Other Ambulatory Visit: Payer: Self-pay

## 2014-06-22 DIAGNOSIS — G4700 Insomnia, unspecified: Secondary | ICD-10-CM

## 2014-06-22 MED ORDER — ZOLPIDEM TARTRATE 10 MG PO TABS: 10.0000 mg | ORAL_TABLET | Freq: Every evening | ORAL | Status: DC | PRN

## 2014-06-23 ENCOUNTER — Telehealth: Payer: Self-pay | Admitting: Family Medicine

## 2014-06-23 NOTE — Telephone Encounter (Signed)
Caller name: Urvi Relation to pt: self Call back number: [phone] Pharmacy: [facility]  Reason for call:   Patient called in inquiring about ambien refill. Called [facility] and they state that they never received refill. Please resend.

## 2014-06-23 NOTE — Telephone Encounter (Signed)
Called in rx to pts pharmacy. Done.

## 2014-06-28 ENCOUNTER — Telehealth: Payer: Self-pay | Admitting: Family Medicine

## 2014-06-28 NOTE — Telephone Encounter (Signed)
Caller name: Crescent Relation to pt: self Call back number: [phone] Pharmacy: [facility]  Reason for call:   Patient states that she is still having shoulder pain but cannot afford to go to specialty or rehab and wanted to know what Dr. Charlett Blake recommends she take

## 2014-06-29 NOTE — Telephone Encounter (Signed)
Try Salon pas patches or gel twice a day. Tylenol ES 500 mg tab 1 tab po bid every day and then if no improvement come in for imaging and evaluation

## 2014-06-29 NOTE — Telephone Encounter (Signed)
Pt informed and voiced understanding

## 2014-06-29 NOTE — Telephone Encounter (Signed)
Left a message for patient to return my call. 

## 2014-07-02 ENCOUNTER — Other Ambulatory Visit: Payer: Self-pay | Admitting: Family Medicine

## 2014-07-07 ENCOUNTER — Telehealth: Payer: Self-pay | Admitting: Family Medicine

## 2014-07-07 DIAGNOSIS — M25579 Pain in unspecified ankle and joints of unspecified foot: Secondary | ICD-10-CM

## 2014-07-07 MED ORDER — HYDROCODONE-ACETAMINOPHEN 10-325 MG PO TABS: 1.0000 | ORAL_TABLET | Freq: Three times a day (TID) | ORAL | Status: DC | PRN

## 2014-07-07 NOTE — Telephone Encounter (Signed)
Please inform pt this is ready and to bring photo id  RX printed last RX done on 05-22-14 quantity 65 with 0 refills

## 2014-07-07 NOTE — Telephone Encounter (Signed)
Patient states that she needs b12 serum called in to [facility] in [place]

## 2014-07-07 NOTE — Telephone Encounter (Signed)
ERROR on b12 serum.   Informed patient that rx was ready for pickup.

## 2014-07-07 NOTE — Telephone Encounter (Signed)
Caller name: Tranesha, Lessner Relation to pt: self Call back number: [phone] Pharmacy:  Reason for call:  Pt requesting a refill of HYDROcodone-acetaminophen (NORCO) 10-325 MG per tablet

## 2014-07-28 ENCOUNTER — Encounter (HOSPITAL_BASED_OUTPATIENT_CLINIC_OR_DEPARTMENT_OTHER): Payer: Self-pay

## 2014-07-28 ENCOUNTER — Emergency Department (HOSPITAL_BASED_OUTPATIENT_CLINIC_OR_DEPARTMENT_OTHER)
Admission: EM | Admit: 2014-07-28 | Discharge: 2014-07-28 | Disposition: A | Discharge status: Home / Self Care | Payer: Self-pay | Attending: Emergency Medicine | Admitting: Emergency Medicine

## 2014-07-28 DIAGNOSIS — Z87891 Personal history of nicotine dependence: Secondary | ICD-10-CM | POA: Insufficient documentation

## 2014-07-28 DIAGNOSIS — R197 Diarrhea, unspecified: Secondary | ICD-10-CM | POA: Insufficient documentation

## 2014-07-28 DIAGNOSIS — Z8619 Personal history of other infectious and parasitic diseases: Secondary | ICD-10-CM | POA: Insufficient documentation

## 2014-07-28 DIAGNOSIS — H6121 Impacted cerumen, right ear: Secondary | ICD-10-CM | POA: Insufficient documentation

## 2014-07-28 DIAGNOSIS — Z7952 Long term (current) use of systemic steroids: Secondary | ICD-10-CM | POA: Insufficient documentation

## 2014-07-28 DIAGNOSIS — R42 Dizziness and giddiness: Secondary | ICD-10-CM | POA: Insufficient documentation

## 2014-07-28 DIAGNOSIS — G4700 Insomnia, unspecified: Secondary | ICD-10-CM | POA: Insufficient documentation

## 2014-07-28 DIAGNOSIS — K589 Irritable bowel syndrome without diarrhea: Secondary | ICD-10-CM | POA: Insufficient documentation

## 2014-07-28 DIAGNOSIS — J45998 Other asthma: Secondary | ICD-10-CM | POA: Insufficient documentation

## 2014-07-28 DIAGNOSIS — Z79899 Other long term (current) drug therapy: Secondary | ICD-10-CM | POA: Insufficient documentation

## 2014-07-28 DIAGNOSIS — I10 Essential (primary) hypertension: Secondary | ICD-10-CM | POA: Insufficient documentation

## 2014-07-28 DIAGNOSIS — Z872 Personal history of diseases of the skin and subcutaneous tissue: Secondary | ICD-10-CM | POA: Insufficient documentation

## 2014-07-28 DIAGNOSIS — Z8639 Personal history of other endocrine, nutritional and metabolic disease: Secondary | ICD-10-CM | POA: Insufficient documentation

## 2014-07-28 DIAGNOSIS — Z8739 Personal history of other diseases of the musculoskeletal system and connective tissue: Secondary | ICD-10-CM | POA: Insufficient documentation

## 2014-07-28 DIAGNOSIS — Z800 Family history of malignant neoplasm of digestive organs: Secondary | ICD-10-CM | POA: Insufficient documentation

## 2014-07-28 DIAGNOSIS — Z7951 Long term (current) use of inhaled steroids: Secondary | ICD-10-CM | POA: Insufficient documentation

## 2014-07-28 DIAGNOSIS — Z8742 Personal history of other diseases of the female genital tract: Secondary | ICD-10-CM | POA: Insufficient documentation

## 2014-07-28 DIAGNOSIS — Z862 Personal history of diseases of the blood and blood-forming organs and certain disorders involving the immune mechanism: Secondary | ICD-10-CM | POA: Insufficient documentation

## 2014-07-28 DIAGNOSIS — K219 Gastro-esophageal reflux disease without esophagitis: Secondary | ICD-10-CM | POA: Insufficient documentation

## 2014-07-28 MED ORDER — ANTIPYRINE-BENZOCAINE 5.4-1.4 % OT SOLN: 3.0000 [drp] | OTIC | Status: DC | PRN

## 2014-07-28 NOTE — ED Notes (Signed)
Right ear pain x 1 week 

## 2014-07-28 NOTE — ED Provider Notes (Signed)
CSN: [id number]     Arrival date & time 07/28/14  1549 History   First MD Initiated Contact with Patient 07/28/14 1603     Chief Complaint  Patient presents with  . Otalgia     (Consider location/radiation/quality/duration/timing/severity/associated sxs/prior Treatment) HPI Comments: 1 week history of right ear pain without trauma. Patient attempted to remove earwax with sweet oil and peroxide without relief. She endorses pain, intermittent dizziness and decreased hearing. Denies any bleeding or drainage. Denies inserting anything into the ear canal. Denies any headache or vision change. Denies any vomiting but has had nausea. No chest pain or shortness of breath. No abdominal pain.  The history is provided by the patient.    Past Medical History  Diagnosis Date  . GERD (gastroesophageal reflux disease)   . Depression   . Anxiety   . Insomnia   . IBS (irritable bowel syndrome)   . Diverticulosis   . Barrett esophagus   . Hiatal hernia   . Unspecified menopausal and postmenopausal disorder   . Vitamin B12 deficiency   . Blind loop syndrome   . Hepatic cyst   . Unspecified hypothyroidism   . Diarrhea   . Family history of malignant neoplasm of gastrointestinal tract     multiple members  . Endometrial hyperplasia 02/27/2006    BENIGN ENDO BX ON 02/2007  . C. difficile diarrhea   . Cystocele, midline   . Rectocele   . Uterine prolapse without mention of vaginal wall prolapse   . Chest pain, atypical 07/14/2011  . Leaky heart valve   . Hiatal hernia   . 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 Thrush 07/21/2011  . Rectal bleeding 10/10/2011  . Sinusitis acute 02/03/2012  . Contact dermatitis 03/23/2012  . Headache(784.0) 04/15/2012  . 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 Hematuria 04/27/2012  . Allergic state 12/31/2012  . 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 Paronychia of great toe, left 06/19/2013  . Low back pain 08/15/2013  . Osteoporosis 05/2011    -2.5 Lt femoral neck  . Adrenal  gland cyst 05/22/2014  . Hypertension   . Asthma   . Adrenal adenoma 05/22/2014   Past Surgical History  Procedure Laterality Date  . Appendectomy    . Uterine fibroid surgery    . Hysteroscopy      POLYP   Family History  Problem Relation Age of Onset  . Colon cancer Mother   . Diabetes Mother   . Hypertension Mother   . Colon polyps Father   . Colon cancer Maternal Aunt   . Colon cancer Maternal Grandmother   . Breast cancer Maternal Grandmother   . Diabetes Paternal Grandmother   . Breast cancer Paternal Grandmother   . Colon cancer Cousin     X3  . Ovarian cancer Cousin    History  Substance Use Topics  . Smoking status: Former Smoker    Quit date: 08/19/1995  . Smokeless tobacco: Never Used  . Alcohol Use: No   OB History    Gravida Para Term Preterm AB TAB SAB Ectopic Multiple Living   6 4   2     4      Review of Systems  Constitutional: Negative for fever, activity change and appetite change.  HENT: Positive for ear pain. Negative for ear discharge.   Respiratory: Negative for cough, chest tightness and shortness of breath.   Cardiovascular: Negative for chest pain.  Gastrointestinal: Negative for nausea, vomiting and abdominal pain.  Genitourinary: Negative for dysuria, hematuria, vaginal  bleeding and vaginal discharge.  Musculoskeletal: Negative for myalgias and arthralgias.  Skin: Negative for rash.  Neurological: Positive for dizziness. Negative for light-headedness, numbness and headaches.  A complete 10 system review of systems was obtained and all systems are negative except as noted in the HPI and PMH.      Allergies  Sulfamethoxazole; Iohexol; and Iodinated diagnostic agents  Home Medications   Prior to Admission medications   Medication Sig Start Date End Date Taking? Authorizing Provider  albuterol (VENTOLIN HFA) 108 (90 BASE) MCG/ACT inhaler INHALE 2 PUFFS BY MOUTH EVERY 6 HOURS AS NEEDED 07/15/13   [name], MD   antipyrine-benzocaine Toniann Fail) otic solution Place 3-4 drops into the right ear every 2 (two) hours as needed for ear pain. 07/28/14   Ezequiel Essex, MD  CARTIA XT 180 MG 24 hr capsule TAKE 1 CAPSULE BY MOUTH ONCE A DAY 06/08/14   [name], MD  carvedilol (COREG) 12.5 MG tablet Take 12.5 mg by mouth daily. 11/04/13   Blane Ohara, MD  cholestyramine Lucrezia Starch) 4 G packet Take 1 packet (4 g total) by mouth 2 (two) times daily. 05/22/14   [name], MD  cyclobenzaprine (FLEXERIL) 10 MG tablet Take 1 tablet (10 mg total) by mouth 3 (three) times daily as needed for muscle spasms. 08/15/13   [name], MD  diazepam (VALIUM) 10 MG tablet TAKE 1 TABLET BY MOUTH EVERY 12 HOURS AS NEEDED FOR ANXIETY OR SLEEP    [name], MD  diltiazem (CARDIZEM CD) 180 MG 24 hr capsule Take 1 capsule (180 mg total) by mouth daily. 08/15/13   [name], MD  fluticasone (FLONASE) 50 MCG/ACT nasal spray PLACE 2 SPRAYS INTO NOSE EVERY DAY 05/16/14   [name], MD  hydrochlorothiazide (HYDRODIURIL) 25 MG tablet TAKE 1 TABLET BY MOUTH EVERY DAY    [name], MD  HYDROcodone-acetaminophen (NORCO) 10-325 MG per tablet Take 1 tablet by mouth every 8 (eight) hours as needed. 07/07/14   [name], MD  hyoscyamine (LEVSIN SL) 0.125 MG SL tablet Place 1 tablet (0.125 mg total) under the tongue every 4 (four) hours as needed for cramping. 02/23/13   Irene Pap, NP  lactobacillus acidophilus (BACID) TABS tablet Take 2 tablets by mouth 3 (three) times daily.    Historical Provider, MD  predniSONE (DELTASONE) 50 MG tablet 1 tab po 13 hours prior then 7 hours prior then 1 hour prior to CT scan 05/23/14   [name], MD  promethazine (PHENERGAN) 25 MG tablet TAKE 1 TABLET BY MOUTH THREE TIMES DAILY AS NEEDED FOR NAUSEA 07/03/14   [name], MD  ranitidine (ZANTAC) 300 MG tablet TAKE 1 TABLET BY MOUTH ONCE DAILY AT BEDTIME 04/11/14   [name], MD  zolpidem (AMBIEN) 10 MG tablet Take  1 tablet (10 mg total) by mouth at bedtime as needed for sleep. 06/22/14   [name], MD   BP 155/62 mmHg  Pulse 73  Temp(Src) 98.5 F (36.9 C) (Oral)  Resp 16  Ht 5\' 6"  (1.676 m)  Wt 167 lb (75.751 kg)  BMI 26.97 kg/m2  SpO2 96% Physical Exam  Constitutional: She is oriented to person, place, and time. She appears well-developed and well-nourished. No distress.  HENT:  Head: Normocephalic and atraumatic.  Right Ear: External ear normal.  Left Ear: External ear normal.  Mouth/Throat: Oropharynx is clear and moist. No oropharyngeal exudate.  Cerumen in R ear  canal.  No mastoid pain or tragus pain.  No pain with pulling of pinna  Eyes: Conjunctivae and EOM are normal. Pupils are equal, round, and reactive to light.  Neck: Normal range of motion. Neck supple.  No meningismus.  Cardiovascular: Normal rate, regular rhythm, normal heart sounds and intact distal pulses.   No murmur heard. Pulmonary/Chest: Effort normal and breath sounds normal. No respiratory distress.  Abdominal: Soft. There is no tenderness. There is no rebound and no guarding.  Musculoskeletal: Normal range of motion. She exhibits no edema or tenderness.  Neurological: She is alert and oriented to person, place, and time. No cranial nerve deficit. She exhibits normal muscle tone. Coordination normal.  No ataxia on finger to nose bilaterally. No pronator drift. 5/5 strength throughout. CN 2-12 intact. Negative Romberg. Equal grip strength. Sensation intact. Gait is normal.   Skin: Skin is warm.  Psychiatric: She has a normal mood and affect. Her behavior is normal.  Nursing note and vitals reviewed.   ED Course  Procedures (including critical care time) Labs Review Labs Reviewed - No data to display  Imaging Review No results found.   EKG Interpretation None      MDM   Final diagnoses:  Cerumen impaction, right   R cerumen impaction.  Irrigation performed by nursing staff. On recheck, the  tympanic membrane is normal. No perforation or infection. Patient feels improved. Discharge home with symptomatic control and follow-up with PCP. Return precautions discussed.  Ezequiel Essex, MD 07/28/14 [phone]

## 2014-07-28 NOTE — Discharge Instructions (Signed)
Cerumen Impaction °A cerumen impaction is when the wax in your ear forms a plug. This plug usually causes reduced hearing. Sometimes it also causes an earache or dizziness. Removing a cerumen impaction can be difficult and painful. The wax sticks to the ear canal. The canal is sensitive and bleeds easily. If you try to remove a heavy wax buildup with a cotton tipped swab, you may push it in further. °Irrigation with water, suction, and small ear curettes may be used to clear out the wax. If the impaction is fixed to the skin in the ear canal, ear drops may be needed for a few days to loosen the wax. People who build up a lot of wax frequently can use ear wax removal products available in your local drugstore. °SEEK MEDICAL CARE IF:  °You develop an earache, increased hearing loss, or marked dizziness. °Document Released: 09/11/2004 Document Revised: 10/27/2011 Document Reviewed: 11/01/2009 °ExitCare® Patient Information ©2015 [employer]. This information is not intended to replace advice given to you by your health care provider. Make sure you discuss any questions you have with your health care provider. ° °

## 2014-08-01 ENCOUNTER — Encounter: Payer: Self-pay | Admitting: Family Medicine

## 2014-08-02 ENCOUNTER — Other Ambulatory Visit: Payer: Self-pay | Admitting: Family Medicine

## 2014-08-02 MED ORDER — HYOSCYAMINE SULFATE ER 0.375 MG PO TBCR: 2.0000 | EXTENDED_RELEASE_TABLET | Freq: Two times a day (BID) | ORAL | Status: DC | PRN

## 2014-08-07 ENCOUNTER — Other Ambulatory Visit: Payer: Self-pay | Admitting: Family Medicine

## 2014-08-07 DIAGNOSIS — M25579 Pain in unspecified ankle and joints of unspecified foot: Secondary | ICD-10-CM

## 2014-08-07 NOTE — Telephone Encounter (Signed)
Requesting Hydrocodone-ACE 10-325mg -Take 1 tablet by mouth every 8 hours as needed. Last refill:07-07-14;#65,0 Last OV:05/22/14 UDS-Signed contract:05/22/14 Please advise.//AB/CMA

## 2014-08-07 NOTE — Telephone Encounter (Addendum)
Pt requesting a refill of HYDROcodone-acetaminophen (NORCO) 10-325 MG per tablet

## 2014-08-08 NOTE — Telephone Encounter (Signed)
OK to refill Hydrocodone, same strength, same sig, #65

## 2014-08-09 MED ORDER — HYDROCODONE-ACETAMINOPHEN 10-325 MG PO TABS: 1.0000 | ORAL_TABLET | Freq: Three times a day (TID) | ORAL | Status: DC | PRN

## 2014-08-09 NOTE — Telephone Encounter (Signed)
Pt following up, on rx

## 2014-08-09 NOTE — Telephone Encounter (Signed)
Rx printed, awaiting sig. Call patient when ready.

## 2014-08-10 NOTE — Telephone Encounter (Signed)
LM for patient that Rx at front desk. Placed in file.

## 2014-08-23 ENCOUNTER — Other Ambulatory Visit: Payer: Self-pay | Admitting: Family Medicine

## 2014-08-23 ENCOUNTER — Encounter: Payer: Self-pay | Admitting: Family Medicine

## 2014-08-23 MED ORDER — FLUOXETINE HCL 20 MG PO CAPS: 20.0000 mg | ORAL_CAPSULE | Freq: Every day | ORAL | Status: DC

## 2014-08-31 ENCOUNTER — Telehealth: Payer: Self-pay | Admitting: Family Medicine

## 2014-08-31 MED ORDER — ALBUTEROL SULFATE HFA 108 (90 BASE) MCG/ACT IN AERS: INHALATION_SPRAY | RESPIRATORY_TRACT | Status: DC

## 2014-08-31 NOTE — Telephone Encounter (Signed)
Rx sent to the pharmacy by e-script.//AB/CMA 

## 2014-08-31 NOTE — Telephone Encounter (Signed)
Caller name:Torr,Joshua Relation to pt: spouse  Call back number: [phone] Pharmacy:  [id number] - [place] [phone] (Phone)     Reason for call:  Pt requesting albuterol (VENTOLIN HFA) 108 (90 BASE) MCG/ACT inhaler

## 2014-08-31 NOTE — Telephone Encounter (Signed)
Patient states that she is out of this medication °

## 2014-09-06 ENCOUNTER — Other Ambulatory Visit: Payer: Self-pay | Admitting: Family Medicine

## 2014-09-06 NOTE — Telephone Encounter (Signed)
Patient has requested refill too soon.  Spoke with patient, who stated that her husband has been taking her medication because he takes this as well, and they didn't realize they were taking from the same bottle.  She states that she does need it and is requesting we refill it again.    Please advise.   eal

## 2014-09-11 ENCOUNTER — Encounter: Payer: Self-pay | Admitting: Family Medicine

## 2014-09-11 ENCOUNTER — Ambulatory Visit (INDEPENDENT_AMBULATORY_CARE_PROVIDER_SITE_OTHER): Payer: 59 | Admitting: Family Medicine

## 2014-09-11 VITALS — BP 166/83 | HR 73 | Temp 98.0°F | Ht 65.5 in | Wt 159.0 lb

## 2014-09-11 DIAGNOSIS — M4328 Fusion of spine, sacral and sacrococcygeal region: Secondary | ICD-10-CM

## 2014-09-11 DIAGNOSIS — T7840XA Allergy, unspecified, initial encounter: Secondary | ICD-10-CM

## 2014-09-11 DIAGNOSIS — I10 Essential (primary) hypertension: Secondary | ICD-10-CM

## 2014-09-11 DIAGNOSIS — G4700 Insomnia, unspecified: Secondary | ICD-10-CM

## 2014-09-11 DIAGNOSIS — Z23 Encounter for immunization: Secondary | ICD-10-CM

## 2014-09-11 DIAGNOSIS — F341 Dysthymic disorder: Secondary | ICD-10-CM

## 2014-09-11 DIAGNOSIS — Z0000 Encounter for general adult medical examination without abnormal findings: Secondary | ICD-10-CM

## 2014-09-11 DIAGNOSIS — K219 Gastro-esophageal reflux disease without esophagitis: Secondary | ICD-10-CM

## 2014-09-11 DIAGNOSIS — M533 Sacrococcygeal disorders, not elsewhere classified: Secondary | ICD-10-CM

## 2014-09-11 DIAGNOSIS — R1084 Generalized abdominal pain: Secondary | ICD-10-CM

## 2014-09-11 DIAGNOSIS — K589 Irritable bowel syndrome without diarrhea: Secondary | ICD-10-CM

## 2014-09-11 DIAGNOSIS — R030 Elevated blood-pressure reading, without diagnosis of hypertension: Secondary | ICD-10-CM

## 2014-09-11 DIAGNOSIS — E785 Hyperlipidemia, unspecified: Secondary | ICD-10-CM

## 2014-09-11 DIAGNOSIS — M25579 Pain in unspecified ankle and joints of unspecified foot: Secondary | ICD-10-CM

## 2014-09-11 DIAGNOSIS — IMO0001 Reserved for inherently not codable concepts without codable children: Secondary | ICD-10-CM

## 2014-09-11 HISTORY — DX: Encounter for general adult medical examination without abnormal findings: Z00.00

## 2014-09-11 LAB — CBC
HEMATOCRIT: 42.6 % (ref 36.0–46.0)
HEMOGLOBIN: 14.4 g/dL (ref 12.0–15.0)
MCHC: 33.7 g/dL (ref 30.0–36.0)
MCV: 92.5 fl (ref 78.0–100.0)
Platelets: 335 10*3/uL (ref 150.0–400.0)
RBC: 4.61 Mil/uL (ref 3.87–5.11)
RDW: 12.9 % (ref 11.5–15.5)
WBC: 6.9 10*3/uL (ref 4.0–10.5)

## 2014-09-11 LAB — LIPID PANEL
Cholesterol: 247 mg/dL — ABNORMAL HIGH (ref 0–200)
HDL: 50.8 mg/dL (ref >39.00)
LDL Cholesterol: 167 mg/dL — ABNORMAL HIGH (ref 0–99)
NonHDL: 196.2
TRIGLYCERIDES: 146 mg/dL (ref 0.0–149.0)
Total CHOL/HDL Ratio: 5
VLDL: 29.2 mg/dL (ref 0.0–40.0)

## 2014-09-11 LAB — COMPREHENSIVE METABOLIC PANEL
ALT: 15 U/L (ref 0–35)
AST: 15 U/L (ref 0–37)
Albumin: 4.5 g/dL (ref 3.5–5.2)
Alkaline Phosphatase: 101 U/L (ref 39–117)
BILIRUBIN TOTAL: 0.5 mg/dL (ref 0.2–1.2)
BUN: 8 mg/dL (ref 6–23)
CO2: 25 mEq/L (ref 19–32)
Calcium: 9.7 mg/dL (ref 8.4–10.5)
Chloride: 106 mEq/L (ref 96–112)
Creatinine, Ser: 0.64 mg/dL (ref 0.40–1.20)
GFR: 99.25 mL/min (ref >60.00)
Glucose, Bld: 110 mg/dL — ABNORMAL HIGH (ref 70–99)
Potassium: 4.1 mEq/L (ref 3.5–5.1)
Sodium: 142 mEq/L (ref 135–145)
Total Protein: 7.8 g/dL (ref 6.0–8.3)

## 2014-09-11 LAB — TSH: TSH: 0.82 u[IU]/mL (ref 0.35–4.50)

## 2014-09-11 MED ORDER — HYDROCODONE-ACETAMINOPHEN 10-325 MG PO TABS: 1.0000 | ORAL_TABLET | Freq: Three times a day (TID) | ORAL | Status: DC | PRN

## 2014-09-11 MED ORDER — LOSARTAN POTASSIUM 25 MG PO TABS: 25.0000 mg | ORAL_TABLET | Freq: Every day | ORAL | Status: DC

## 2014-09-11 MED ORDER — ZOLPIDEM TARTRATE 10 MG PO TABS: 10.0000 mg | ORAL_TABLET | Freq: Every evening | ORAL | Status: DC | PRN

## 2014-09-11 MED ORDER — DIAZEPAM 10 MG PO TABS: 10.0000 mg | ORAL_TABLET | Freq: Two times a day (BID) | ORAL | Status: DC | PRN

## 2014-09-11 NOTE — Assessment & Plan Note (Signed)
Encouraged healthy diet, avoid artifical sweeteners continue probiotics and given refill on pain meds to use prn

## 2014-09-11 NOTE — Assessment & Plan Note (Signed)
Not taking her antihistamine is encouraged to restart

## 2014-09-11 NOTE — Assessment & Plan Note (Signed)
Allowed refill on Ambien. Encouraged good sleep hygiene such as dark, quiet room. No blue/green glowing lights such as computer screens in bedroom. No alcohol or stimulants in evening. Cut down on caffeine as able. Regular exercise is helpful but not just prior to bed time.

## 2014-09-11 NOTE — Assessment & Plan Note (Addendum)
Patient encouraged to maintain heart healthy diet, regular exercise, adequate sleep. Consider daily probiotics. Take medications as prescribed. Labs today. GYN with  Dr Phineas Real, BI at [facility]

## 2014-09-11 NOTE — Patient Instructions (Signed)
Preventive Care for Adults A healthy lifestyle and preventive care can promote health and wellness. Preventive health guidelines for women include the following key practices.  A routine yearly physical is a good way to check with your health care provider about your health and preventive screening. It is a chance to share any concerns and updates on your health and to receive a thorough exam.  Visit your dentist for a routine exam and preventive care every 6 months. Brush your teeth twice a day and floss once a day. Good oral hygiene prevents tooth decay and gum disease.  The frequency of eye exams is based on your age, health, family medical history, use of contact lenses, and other factors. Follow your health care provider's recommendations for frequency of eye exams.  Eat a healthy diet. Foods like vegetables, fruits, whole grains, low-fat dairy products, and lean protein foods contain the nutrients you need without too many calories. Decrease your intake of foods high in solid fats, added sugars, and salt. Eat the right amount of calories for you.Get information about a proper diet from your health care provider, if necessary.  Regular physical exercise is one of the most important things you can do for your health. Most adults should get at least 150 minutes of moderate-intensity exercise (any activity that increases your heart rate and causes you to sweat) each week. In addition, most adults need muscle-strengthening exercises on 2 or more days a week.  Maintain a healthy weight. The body mass index (BMI) is a screening tool to identify possible weight problems. It provides an estimate of body fat based on height and weight. Your health care provider can find your BMI and can help you achieve or maintain a healthy weight.For adults 20 years and older:  A BMI below 18.5 is considered underweight.  A BMI of 18.5 to 24.9 is normal.  A BMI of 25 to 29.9 is considered overweight.  A BMI of  30 and above is considered obese.  Maintain normal blood lipids and cholesterol levels by exercising and minimizing your intake of saturated fat. Eat a balanced diet with plenty of fruit and vegetables. Blood tests for lipids and cholesterol should begin at age 22 and be repeated every 5 years. If your lipid or cholesterol levels are high, you are over 50, or you are at high risk for heart disease, you may need your cholesterol levels checked more frequently.Ongoing high lipid and cholesterol levels should be treated with medicines if diet and exercise are not working.  If you smoke, find out from your health care provider how to quit. If you do not use tobacco, do not start.  Lung cancer screening is recommended for adults aged 67-80 years who are at high risk for developing lung cancer because of a history of smoking. A yearly low-dose CT scan of the lungs is recommended for people who have at least a 30-pack-year history of smoking and are a current smoker or have quit within the past 15 years. A pack year of smoking is smoking an average of 1 pack of cigarettes a day for 1 year (for example: 1 pack a day for 30 years or 2 packs a day for 15 years). Yearly screening should continue until the smoker has stopped smoking for at least 15 years. Yearly screening should be stopped for people who develop a health problem that would prevent them from having lung cancer treatment.  If you are pregnant, do not drink alcohol. If you are breastfeeding,  be very cautious about drinking alcohol. If you are not pregnant and choose to drink alcohol, do not have more than 1 drink per day. One drink is considered to be 12 ounces (355 mL) of beer, 5 ounces (148 mL) of wine, or 1.5 ounces (44 mL) of liquor.  Avoid use of street drugs. Do not share needles with anyone. Ask for help if you need support or instructions about stopping the use of drugs.  High blood pressure causes heart disease and increases the risk of  stroke. Your blood pressure should be checked at least every 1 to 2 years. Ongoing high blood pressure should be treated with medicines if weight loss and exercise do not work.  If you are 75-52 years old, ask your health care provider if you should take aspirin to prevent strokes.  Diabetes screening involves taking a blood sample to check your fasting blood sugar level. This should be done once every 3 years, after age 15, if you are within normal weight and without risk factors for diabetes. Testing should be considered at a younger age or be carried out more frequently if you are overweight and have at least 1 risk factor for diabetes.  Breast cancer screening is essential preventive care for women. You should practice "breast self-awareness." This means understanding the normal appearance and feel of your breasts and may include breast self-examination. Any changes detected, no matter how small, should be reported to a health care provider. Women in their 58s and 30s should have a clinical breast exam (CBE) by a health care provider as part of a regular health exam every 1 to 3 years. After age 16, women should have a CBE every year. Starting at age 53, women should consider having a mammogram (breast X-ray test) every year. Women who have a family history of breast cancer should talk to their health care provider about genetic screening. Women at a high risk of breast cancer should talk to their health care providers about having an MRI and a mammogram every year.  Breast cancer gene (BRCA)-related cancer risk assessment is recommended for women who have family members with BRCA-related cancers. BRCA-related cancers include breast, ovarian, tubal, and peritoneal cancers. Having family members with these cancers may be associated with an increased risk for harmful changes (mutations) in the breast cancer genes BRCA1 and BRCA2. Results of the assessment will determine the need for genetic counseling and  BRCA1 and BRCA2 testing.  Routine pelvic exams to screen for cancer are no longer recommended for nonpregnant women who are considered low risk for cancer of the pelvic organs (ovaries, uterus, and vagina) and who do not have symptoms. Ask your health care provider if a screening pelvic exam is right for you.  If you have had past treatment for cervical cancer or a condition that could lead to cancer, you need Pap tests and screening for cancer for at least 20 years after your treatment. If Pap tests have been discontinued, your risk factors (such as having a new sexual partner) need to be reassessed to determine if screening should be resumed. Some women have medical problems that increase the chance of getting cervical cancer. In these cases, your health care provider may recommend more frequent screening and Pap tests.  The HPV test is an additional test that may be used for cervical cancer screening. The HPV test looks for the virus that can cause the cell changes on the cervix. The cells collected during the Pap test can be  tested for HPV. The HPV test could be used to screen women aged 31 years and older, and should be used in women of any age who have unclear Pap test results. After the age of 69, women should have HPV testing at the same frequency as a Pap test.  Colorectal cancer can be detected and often prevented. Most routine colorectal cancer screening begins at the age of 25 years and continues through age 27 years. However, your health care provider may recommend screening at an earlier age if you have risk factors for colon cancer. On a yearly basis, your health care provider may provide home test kits to check for hidden blood in the stool. Use of a small camera at the end of a tube, to directly examine the colon (sigmoidoscopy or colonoscopy), can detect the earliest forms of colorectal cancer. Talk to your health care provider about this at age 62, when routine screening begins. Direct  exam of the colon should be repeated every 5-10 years through age 74 years, unless early forms of pre-cancerous polyps or small growths are found.  People who are at an increased risk for hepatitis B should be screened for this virus. You are considered at high risk for hepatitis B if:  You were born in a country where hepatitis B occurs often. Talk with your health care provider about which countries are considered high risk.  Your parents were born in a high-risk country and you have not received a shot to protect against hepatitis B (hepatitis B vaccine).  You have HIV or AIDS.  You use needles to inject street drugs.  You live with, or have sex with, someone who has hepatitis B.  You get hemodialysis treatment.  You take certain medicines for conditions like cancer, organ transplantation, and autoimmune conditions.  Hepatitis C blood testing is recommended for all people born from [birth year] through 1965 and any individual with known risks for hepatitis C.  Practice safe sex. Use condoms and avoid high-risk sexual practices to reduce the spread of sexually transmitted infections (STIs). STIs include gonorrhea, chlamydia, syphilis, trichomonas, herpes, HPV, and human immunodeficiency virus (HIV). Herpes, HIV, and HPV are viral illnesses that have no cure. They can result in disability, cancer, and death.  You should be screened for sexually transmitted illnesses (STIs) including gonorrhea and chlamydia if:  You are sexually active and are younger than 24 years.  You are older than 24 years and your health care provider tells you that you are at risk for this type of infection.  Your sexual activity has changed since you were last screened and you are at an increased risk for chlamydia or gonorrhea. Ask your health care provider if you are at risk.  If you are at risk of being infected with HIV, it is recommended that you take a prescription medicine daily to prevent HIV infection. This is  called preexposure prophylaxis (PrEP). You are considered at risk if:  You are a heterosexual woman, are sexually active, and are at increased risk for HIV infection.  You take drugs by injection.  You are sexually active with a partner who has HIV.  Talk with your health care provider about whether you are at high risk of being infected with HIV. If you choose to begin PrEP, you should first be tested for HIV. You should then be tested every 3 months for as long as you are taking PrEP.  Osteoporosis is a disease in which the bones lose minerals and strength  with aging. This can result in serious bone fractures or breaks. The risk of osteoporosis can be identified using a bone density scan. Women ages 46 years and over and women at risk for fractures or osteoporosis should discuss screening with their health care providers. Ask your health care provider whether you should take a calcium supplement or vitamin D to reduce the rate of osteoporosis.  Menopause can be associated with physical symptoms and risks. Hormone replacement therapy is available to decrease symptoms and risks. You should talk to your health care provider about whether hormone replacement therapy is right for you.  Use sunscreen. Apply sunscreen liberally and repeatedly throughout the day. You should seek shade when your shadow is shorter than you. Protect yourself by wearing long sleeves, pants, a wide-brimmed hat, and sunglasses year round, whenever you are outdoors.  Once a month, do a whole body skin exam, using a mirror to look at the skin on your back. Tell your health care provider of new moles, moles that have irregular borders, moles that are larger than a pencil eraser, or moles that have changed in shape or color.  Stay current with required vaccines (immunizations).  Influenza vaccine. All adults should be immunized every year.  Tetanus, diphtheria, and acellular pertussis (Td, Tdap) vaccine. Pregnant women should  receive 1 dose of Tdap vaccine during each pregnancy. The dose should be obtained regardless of the length of time since the last dose. Immunization is preferred during the 27th-36th week of gestation. An adult who has not previously received Tdap or who does not know her vaccine status should receive 1 dose of Tdap. This initial dose should be followed by tetanus and diphtheria toxoids (Td) booster doses every 10 years. Adults with an unknown or incomplete history of completing a 3-dose immunization series with Td-containing vaccines should begin or complete a primary immunization series including a Tdap dose. Adults should receive a Td booster every 10 years.  Varicella vaccine. An adult without evidence of immunity to varicella should receive 2 doses or a second dose if she has previously received 1 dose. Pregnant females who do not have evidence of immunity should receive the first dose after pregnancy. This first dose should be obtained before leaving the health care facility. The second dose should be obtained 4-8 weeks after the first dose.  Human papillomavirus (HPV) vaccine. Females aged 13-26 years who have not received the vaccine previously should obtain the 3-dose series. The vaccine is not recommended for use in pregnant females. However, pregnancy testing is not needed before receiving a dose. If a female is found to be pregnant after receiving a dose, no treatment is needed. In that case, the remaining doses should be delayed until after the pregnancy. Immunization is recommended for any person with an immunocompromised condition through the age of 97 years if she did not get any or all doses earlier. During the 3-dose series, the second dose should be obtained 4-8 weeks after the first dose. The third dose should be obtained 24 weeks after the first dose and 16 weeks after the second dose.  Zoster vaccine. One dose is recommended for adults aged 7 years or older unless certain conditions are  present.  Measles, mumps, and rubella (MMR) vaccine. Adults born before 60 generally are considered immune to measles and mumps. Adults born in [birth year] or later should have 1 or more doses of MMR vaccine unless there is a contraindication to the vaccine or there is laboratory evidence of immunity to  each of the three diseases. A routine second dose of MMR vaccine should be obtained at least 28 days after the first dose for students attending postsecondary schools, health care workers, or international travelers. People who received inactivated measles vaccine or an unknown type of measles vaccine during 1963-1967 should receive 2 doses of MMR vaccine. People who received inactivated mumps vaccine or an unknown type of mumps vaccine before 1979 and are at high risk for mumps infection should consider immunization with 2 doses of MMR vaccine. For females of childbearing age, rubella immunity should be determined. If there is no evidence of immunity, females who are not pregnant should be vaccinated. If there is no evidence of immunity, females who are pregnant should delay immunization until after pregnancy. Unvaccinated health care workers born before 26 who lack laboratory evidence of measles, mumps, or rubella immunity or laboratory confirmation of disease should consider measles and mumps immunization with 2 doses of MMR vaccine or rubella immunization with 1 dose of MMR vaccine.  Pneumococcal 13-valent conjugate (PCV13) vaccine. When indicated, a person who is uncertain of her immunization history and has no record of immunization should receive the PCV13 vaccine. An adult aged 51 years or older who has certain medical conditions and has not been previously immunized should receive 1 dose of PCV13 vaccine. This PCV13 should be followed with a dose of pneumococcal polysaccharide (PPSV23) vaccine. The PPSV23 vaccine dose should be obtained at least 8 weeks after the dose of PCV13 vaccine. An adult aged 65  years or older who has certain medical conditions and previously received 1 or more doses of PPSV23 vaccine should receive 1 dose of PCV13. The PCV13 vaccine dose should be obtained 1 or more years after the last PPSV23 vaccine dose.  Pneumococcal polysaccharide (PPSV23) vaccine. When PCV13 is also indicated, PCV13 should be obtained first. All adults aged 30 years and older should be immunized. An adult younger than age 48 years who has certain medical conditions should be immunized. Any person who resides in a nursing home or long-term care facility should be immunized. An adult smoker should be immunized. People with an immunocompromised condition and certain other conditions should receive both PCV13 and PPSV23 vaccines. People with human immunodeficiency virus (HIV) infection should be immunized as soon as possible after diagnosis. Immunization during chemotherapy or radiation therapy should be avoided. Routine use of PPSV23 vaccine is not recommended for American Indians, [place] Natives, or people younger than 65 years unless there are medical conditions that require PPSV23 vaccine. When indicated, people who have unknown immunization and have no record of immunization should receive PPSV23 vaccine. One-time revaccination 5 years after the first dose of PPSV23 is recommended for people aged 19-64 years who have chronic kidney failure, nephrotic syndrome, asplenia, or immunocompromised conditions. People who received 1-2 doses of PPSV23 before age 65 years should receive another dose of PPSV23 vaccine at age 87 years or later if at least 5 years have passed since the previous dose. Doses of PPSV23 are not needed for people immunized with PPSV23 at or after age 26 years.  Meningococcal vaccine. Adults with asplenia or persistent complement component deficiencies should receive 2 doses of quadrivalent meningococcal conjugate (MenACWY-D) vaccine. The doses should be obtained at least 2 months apart.  Microbiologists working with certain meningococcal bacteria, [place] recruits, people at risk during an outbreak, and people who travel to or live in countries with a high rate of meningitis should be immunized. A first-year college student up through age  21 years who is living in a residence hall should receive a dose if she did not receive a dose on or after her 16th birthday. Adults who have certain high-risk conditions should receive one or more doses of vaccine.  Hepatitis A vaccine. Adults who wish to be protected from this disease, have certain high-risk conditions, work with hepatitis A-infected animals, work in hepatitis A research labs, or travel to or work in countries with a high rate of hepatitis A should be immunized. Adults who were previously unvaccinated and who anticipate close contact with an international adoptee during the first 60 days after arrival in the [place] from a country with a high rate of hepatitis A should be immunized.  Hepatitis B vaccine. Adults who wish to be protected from this disease, have certain high-risk conditions, may be exposed to blood or other infectious body fluids, are household contacts or sex partners of hepatitis B positive people, are clients or workers in certain care facilities, or travel to or work in countries with a high rate of hepatitis B should be immunized.  Haemophilus influenzae type b (Hib) vaccine. A previously unvaccinated person with asplenia or sickle cell disease or having a scheduled splenectomy should receive 1 dose of Hib vaccine. Regardless of previous immunization, a recipient of a hematopoietic stem cell transplant should receive a 3-dose series 6-12 months after her successful transplant. Hib vaccine is not recommended for adults with HIV infection. Preventive Services / Frequency Ages 64 to 68 years  Blood pressure check.** / Every 1 to 2 years.  Lipid and cholesterol check.** / Every 5 years beginning at age  22.  Clinical breast exam.** / Every 3 years for women in their 88s and 53s.  BRCA-related cancer risk assessment.** / For women who have family members with a BRCA-related cancer (breast, ovarian, tubal, or peritoneal cancers).  Pap test.** / Every 2 years from ages 90 through 51. Every 3 years starting at age 21 through age 56 or 3 with a history of 3 consecutive normal Pap tests.  HPV screening.** / Every 3 years from ages 24 through ages 1 to 46 with a history of 3 consecutive normal Pap tests.  Hepatitis C blood test.** / For any individual with known risks for hepatitis C.  Skin self-exam. / Monthly.  Influenza vaccine. / Every year.  Tetanus, diphtheria, and acellular pertussis (Tdap, Td) vaccine.** / Consult your health care provider. Pregnant women should receive 1 dose of Tdap vaccine during each pregnancy. 1 dose of Td every 10 years.  Varicella vaccine.** / Consult your health care provider. Pregnant females who do not have evidence of immunity should receive the first dose after pregnancy.  HPV vaccine. / 3 doses over 6 months, if 72 and younger. The vaccine is not recommended for use in pregnant females. However, pregnancy testing is not needed before receiving a dose.  Measles, mumps, rubella (MMR) vaccine.** / You need at least 1 dose of MMR if you were born in 1957 or later. You may also need a 2nd dose. For females of childbearing age, rubella immunity should be determined. If there is no evidence of immunity, females who are not pregnant should be vaccinated. If there is no evidence of immunity, females who are pregnant should delay immunization until after pregnancy.  Pneumococcal 13-valent conjugate (PCV13) vaccine.** / Consult your health care provider.  Pneumococcal polysaccharide (PPSV23) vaccine.** / 1 to 2 doses if you smoke cigarettes or if you have certain conditions.  Meningococcal vaccine.** /  1 dose if you are age 26 to 26 years and a [place] living in a residence hall, or have one of several medical conditions, you need to get vaccinated against meningococcal disease. You may also need additional booster doses.  Hepatitis A vaccine.** / Consult your health care provider.  Hepatitis B vaccine.** / Consult your health care provider.  Haemophilus influenzae type b (Hib) vaccine.** / Consult your health care provider. Ages 64 to 108 years  Blood pressure check.** / Every 1 to 2 years.  Lipid and cholesterol check.** / Every 5 years beginning at age 53 years.  Lung cancer screening. / Every year if you are aged 61-80 years and have a 30-pack-year history of smoking and currently smoke or have quit within the past 15 years. Yearly screening is stopped once you have quit smoking for at least 15 years or develop a health problem that would prevent you from having lung cancer treatment.  Clinical breast exam.** / Every year after age 46 years.  BRCA-related cancer risk assessment.** / For women who have family members with a BRCA-related cancer (breast, ovarian, tubal, or peritoneal cancers).  Mammogram.** / Every year beginning at age 35 years and continuing for as long as you are in good health. Consult with your health care provider.  Pap test.** / Every 3 years starting at age 79 years through age 65 or 7 years with a history of 3 consecutive normal Pap tests.  HPV screening.** / Every 3 years from ages 60 years through ages 46 to 16 years with a history of 3 consecutive normal Pap tests.  Fecal occult blood test (FOBT) of stool. / Every year beginning at age 61 years and continuing until age 58 years. You may not need to do this test if you get a colonoscopy every 10 years.  Flexible sigmoidoscopy or colonoscopy.** / Every 5 years for a flexible sigmoidoscopy or every 10 years for a colonoscopy beginning at age 72 years and continuing until age 32 years.  Hepatitis C blood test.** / For all people born from [birth year] through  1966 and any individual with known risks for hepatitis C.  Skin self-exam. / Monthly.  Influenza vaccine. / Every year.  Tetanus, diphtheria, and acellular pertussis (Tdap/Td) vaccine.** / Consult your health care provider. Pregnant women should receive 1 dose of Tdap vaccine during each pregnancy. 1 dose of Td every 10 years.  Varicella vaccine.** / Consult your health care provider. Pregnant females who do not have evidence of immunity should receive the first dose after pregnancy.  Zoster vaccine.** / 1 dose for adults aged 38 years or older.  Measles, mumps, rubella (MMR) vaccine.** / You need at least 1 dose of MMR if you were born in 1957 or later. You may also need a 2nd dose. For females of childbearing age, rubella immunity should be determined. If there is no evidence of immunity, females who are not pregnant should be vaccinated. If there is no evidence of immunity, females who are pregnant should delay immunization until after pregnancy.  Pneumococcal 13-valent conjugate (PCV13) vaccine.** / Consult your health care provider.  Pneumococcal polysaccharide (PPSV23) vaccine.** / 1 to 2 doses if you smoke cigarettes or if you have certain conditions.  Meningococcal vaccine.** / Consult your health care provider.  Hepatitis A vaccine.** / Consult your health care provider.  Hepatitis B vaccine.** / Consult your health care provider.  Haemophilus influenzae type b (Hib) vaccine.** / Consult your health care provider. Ages 80  years and over  Blood pressure check.** / Every 1 to 2 years.  Lipid and cholesterol check.** / Every 5 years beginning at age 70 years.  Lung cancer screening. / Every year if you are aged 56-80 years and have a 30-pack-year history of smoking and currently smoke or have quit within the past 15 years. Yearly screening is stopped once you have quit smoking for at least 15 years or develop a health problem that would prevent you from having lung cancer  treatment.  Clinical breast exam.** / Every year after age 44 years.  BRCA-related cancer risk assessment.** / For women who have family members with a BRCA-related cancer (breast, ovarian, tubal, or peritoneal cancers).  Mammogram.** / Every year beginning at age 6 years and continuing for as long as you are in good health. Consult with your health care provider.  Pap test.** / Every 3 years starting at age 66 years through age 71 or 59 years with 3 consecutive normal Pap tests. Testing can be stopped between 65 and 70 years with 3 consecutive normal Pap tests and no abnormal Pap or HPV tests in the past 10 years.  HPV screening.** / Every 3 years from ages 96 years through ages 16 or 56 years with a history of 3 consecutive normal Pap tests. Testing can be stopped between 65 and 70 years with 3 consecutive normal Pap tests and no abnormal Pap or HPV tests in the past 10 years.  Fecal occult blood test (FOBT) of stool. / Every year beginning at age 15 years and continuing until age 75 years. You may not need to do this test if you get a colonoscopy every 10 years.  Flexible sigmoidoscopy or colonoscopy.** / Every 5 years for a flexible sigmoidoscopy or every 10 years for a colonoscopy beginning at age 52 years and continuing until age 109 years.  Hepatitis C blood test.** / For all people born from [birth year] through 1966 and any individual with known risks for hepatitis C.  Osteoporosis screening.** / A one-time screening for women ages 3 years and over and women at risk for fractures or osteoporosis.  Skin self-exam. / Monthly.  Influenza vaccine. / Every year.  Tetanus, diphtheria, and acellular pertussis (Tdap/Td) vaccine.** / 1 dose of Td every 10 years.  Varicella vaccine.** / Consult your health care provider.  Zoster vaccine.** / 1 dose for adults aged 5 years or older.  Pneumococcal 13-valent conjugate (PCV13) vaccine.** / Consult your health care provider.  Pneumococcal  polysaccharide (PPSV23) vaccine.** / 1 dose for all adults aged 74 years and older.  Meningococcal vaccine.** / Consult your health care provider.  Hepatitis A vaccine.** / Consult your health care provider.  Hepatitis B vaccine.** / Consult your health care provider.  Haemophilus influenzae type b (Hib) vaccine.** / Consult your health care provider. ** Family history and personal history of risk and conditions may change your health care provider's recommendations. Document Released: 09/30/2001 Document Revised: 12/19/2013 Document Reviewed: 12/30/2010 [facility] Patient Information 2015 [place], [place]. This information is not intended to replace advice given to you by your health care provider. Make sure you discuss any questions you have with your health care provider.

## 2014-09-11 NOTE — Progress Notes (Signed)
Pre visit review using our clinic review tool, if applicable. No additional management support is needed unless otherwise documented below in the visit note. 

## 2014-09-11 NOTE — Assessment & Plan Note (Signed)
Describing some episodes of low sugar with diaphoresis after not eating for hours. Encouraged small, frequent meals with lean proteins and healthy fats. Check CMP

## 2014-09-11 NOTE — Progress Notes (Signed)
Patient ID: Megan [name], female   DOB: 1951-04-12, 64 y.o.   MRN: [id number]   IYONA PEHRSON  [id number] 1950/09/19 09/11/2014      Progress Note-Follow Up  Subjective  Chief Complaint  Chief Complaint  Patient presents with  . Annual Exam    no pap smear-fasting    HPI  Patient is a 64 y.o. female in today for routine medical care. Continues to struggle with numerous chronic complaints. Continues to struggle with intermittent diarrhea and abdominal pain but it is manageable with current meds and addition of probiotics. No acute illness but does complain of ongong nasal congestion, PND and throat irritation. No fevers or chills. Follows with GYN for paps, no female concerns. Denies CP/palp/SOB/HA/congestion/fevers/GI or GU c/o. Taking meds as prescribed  Past Medical History  Diagnosis Date  . GERD (gastroesophageal reflux disease)   . Depression   . Anxiety   . Insomnia   . IBS (irritable bowel syndrome)   . Diverticulosis   . Barrett esophagus   . Hiatal hernia   . Unspecified menopausal and postmenopausal disorder   . Vitamin B12 deficiency   . Blind loop syndrome   . Hepatic cyst   . Unspecified hypothyroidism   . Diarrhea   . Family history of malignant neoplasm of gastrointestinal tract     multiple members  . Endometrial hyperplasia 02/27/2006    BENIGN ENDO BX ON 02/2007  . C. difficile diarrhea   . Cystocele, midline   . Rectocele   . Uterine prolapse without mention of vaginal wall prolapse   . Chest pain, atypical 07/14/2011  . Leaky heart valve   . Hiatal hernia   . 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 Thrush 07/21/2011  . Rectal bleeding 10/10/2011  . Sinusitis acute 02/03/2012  . Contact dermatitis 03/23/2012  . Headache(784.0) 04/15/2012  . 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 Hematuria 04/27/2012  . Allergic state 12/31/2012  . 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 Paronychia of great toe, left 06/19/2013  . Low back pain 08/15/2013  .  Osteoporosis 05/2011    -2.5 Lt femoral neck  . 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 Hypertension   . Asthma   . Adrenal adenoma 05/22/2014    Past Surgical History  Procedure Laterality Date  . Appendectomy    . Uterine fibroid surgery    . Hysteroscopy      POLYP    Family History  Problem Relation Age of Onset  . Colon cancer Mother   . Diabetes Mother   . Hypertension Mother   . Colon polyps Father   . Colon cancer Maternal Aunt   . Colon cancer Maternal Grandmother   . Breast cancer Maternal Grandmother   . Diabetes Paternal Grandmother   . Breast cancer Paternal Grandmother   . Colon cancer Cousin     X3  . Ovarian cancer Cousin     History   Social History  . Marital Status: Married    Spouse Name: N/A    Number of Children: 4  . Years of Education: N/A   Occupational History  . Retired    Social History Main Topics  . Smoking status: Former Smoker    Quit date: 08/19/1995  . Smokeless tobacco: Never Used  . Alcohol Use: No  . Drug Use: No  . Sexual Activity: No   Other Topics Concern  . Not on file   Social History Narrative    Current Outpatient Prescriptions on File Prior to Visit  Medication Sig Dispense Refill  . albuterol (VENTOLIN HFA) 108 (90 BASE) MCG/ACT inhaler INHALE 2 PUFFS BY MOUTH EVERY 6 HOURS AS NEEDED 18 each 1  . antipyrine-benzocaine (AURALGAN) otic solution Place 3-4 drops into the right ear every 2 (two) hours as needed for ear pain. 10 mL 0  . CARTIA XT 180 MG 24 hr capsule TAKE 1 CAPSULE BY MOUTH ONCE A DAY 90 capsule 1  . carvedilol (COREG) 12.5 MG tablet Take 12.5 mg by mouth daily.    . cholestyramine (QUESTRAN) 4 G packet Take 1 packet (4 g total) by mouth 2 (two) times daily. 60 each 5  . cyclobenzaprine (FLEXERIL) 10 MG tablet Take 1 tablet (10 mg total) by mouth 3 (three) times daily as needed for muscle spasms. 40 tablet 1  . diazepam (VALIUM) 10 MG tablet TAKE 1 TABLET BY MOUTH EVERY 12 HOURS AS NEEDED FOR ANXIETY OR  SLEEP 60 tablet 0  . diltiazem (CARDIZEM CD) 180 MG 24 hr capsule Take 1 capsule (180 mg total) by mouth daily. 90 capsule 3  . FLUoxetine (PROZAC) 20 MG capsule Take 1 capsule (20 mg total) by mouth daily. 30 capsule 2  . fluticasone (FLONASE) 50 MCG/ACT nasal spray PLACE 2 SPRAYS INTO NOSE EVERY DAY 16 g 1  . hydrochlorothiazide (HYDRODIURIL) 25 MG tablet TAKE 1 TABLET BY MOUTH EVERY DAY 30 tablet 0  . HYDROcodone-acetaminophen (NORCO) 10-325 MG per tablet Take 1 tablet by mouth every 8 (eight) hours as needed. 65 tablet 0  . hyoscyamine (LEVBID) 0.375 MG 12 hr tablet TAKE 2 TABLETS BY MOUTH TWICE DAILY AS NEEDED 360 tablet 2  . lactobacillus acidophilus (BACID) TABS tablet Take 2 tablets by mouth 3 (three) times daily.    . promethazine (PHENERGAN) 25 MG tablet TAKE 1 TABLET BY MOUTH THREE TIMES DAILY AS NEEDED FOR NAUSEA 30 tablet 0  . ranitidine (ZANTAC) 300 MG tablet TAKE 1 TABLET BY MOUTH ONCE DAILY AT BEDTIME 90 tablet 0  . zolpidem (AMBIEN) 10 MG tablet Take 1 tablet (10 mg total) by mouth at bedtime as needed for sleep. 15 tablet 2   No current facility-administered medications on file prior to visit.    Allergies  Allergen Reactions  . Sulfamethoxazole Shortness Of Breath    chest tightness  . Iohexol   . Iodinated Diagnostic Agents     Pt states at Dr.Grapeys office 15 years ago blacked out for 2 hours during injection for IVP. Was told never to have IV dye again.     Review of Systems  Review of Systems  Constitutional: Positive for malaise/fatigue. Negative for fever and chills.  HENT: Positive for congestion and ear pain. Negative for ear discharge, hearing loss, nosebleeds and tinnitus.   Eyes: Negative for discharge.  Respiratory: Negative for cough, sputum production, shortness of breath and wheezing.   Cardiovascular: Negative for chest pain, palpitations and leg swelling.  Gastrointestinal: Positive for heartburn and abdominal pain. Negative for nausea, vomiting,  diarrhea, constipation and blood in stool.  Genitourinary: Negative for dysuria, urgency, frequency and hematuria.  Musculoskeletal: Positive for back pain. Negative for myalgias and falls.  Skin: Negative for rash.  Neurological: Negative for dizziness, tremors, sensory change, focal weakness, loss of consciousness, weakness and headaches.  Endo/Heme/Allergies: Negative for polydipsia. Does not bruise/bleed easily.  Psychiatric/Behavioral: Negative for depression and suicidal ideas. The patient is nervous/anxious. The patient does not have insomnia.     Objective  BP 166/83 mmHg  Pulse 73  Temp(Src) 98 F (  36.7 C) (Oral)  Ht 5' 5.5" (1.664 m)  Wt 159 lb (72.122 kg)  BMI 26.05 kg/m2  SpO2 99%  Physical Exam  Physical Exam  Constitutional: She is oriented to person, place, and time and well-developed, well-nourished, and in no distress. No distress.  HENT:  Head: Normocephalic and atraumatic.  Right Ear: External ear normal.  Left Ear: External ear normal.  Nose: Nose normal.  Mouth/Throat: Oropharynx is clear and moist. No oropharyngeal exudate.  Eyes: Conjunctivae are normal. Pupils are equal, round, and reactive to light. Right eye exhibits no discharge. Left eye exhibits no discharge. No scleral icterus.  Neck: Normal range of motion. Neck supple. No thyromegaly present.  Cardiovascular: Normal rate, regular rhythm, normal heart sounds and intact distal pulses.   No murmur heard. Pulmonary/Chest: Effort normal and breath sounds normal. No respiratory distress. She has no wheezes. She has no rales.  Abdominal: Soft. Bowel sounds are normal. She exhibits no distension and no mass. There is no tenderness.  Musculoskeletal: Normal range of motion. She exhibits no edema or tenderness.  Lymphadenopathy:    She has no cervical adenopathy.  Neurological: She is alert and oriented to person, place, and time. She has normal reflexes. No cranial nerve deficit. Coordination normal.    Skin: Skin is warm and dry. No rash noted. She is not diaphoretic.  Psychiatric: Mood, memory and affect normal.    Lab Results  Component Value Date   TSH 0.751 03/10/2014   Lab Results  Component Value Date   WBC 7.3 05/22/2014   HGB 14.1 05/22/2014   HCT 42.8 05/22/2014   MCV 94.5 05/22/2014   PLT 340.0 05/22/2014   Lab Results  Component Value Date   CREATININE 0.7 05/22/2014   BUN 6 05/22/2014   NA 138 05/22/2014   K 3.5 05/22/2014   CL 103 05/22/2014   CO2 26 05/22/2014   Lab Results  Component Value Date   ALT 16 03/10/2014   AST 15 03/10/2014   ALKPHOS 97 03/10/2014   BILITOT 0.4 03/10/2014   Lab Results  Component Value Date   CHOL 189 03/10/2014   Lab Results  Component Value Date   HDL 53 03/10/2014   Lab Results  Component Value Date   LDLCALC 107* 03/10/2014   Lab Results  Component Value Date   TRIG 147 03/10/2014   Lab Results  Component Value Date   CHOLHDL 3.6 03/10/2014     Assessment & Plan   IBS (irritable bowel syndrome) Avoid offending foods, continue probiotics. Do not eat large meals in late evening and consider raising head of bed. Add a fiber supplement bid    Essential hypertension Poorly controlled will alter medications, encouraged DASH diet, minimize caffeine and obtain adequate sleep. Report concerning symptoms and follow up as directed and as needed. Add Losartan 25 mg daily   Abdominal pain Encouraged healthy diet, avoid artifical sweeteners continue probiotics and given refill on pain meds to use prn   Allergic state Not taking her antihistamine is encouraged to restart   HYPERGLYCEMIA Describing some episodes of low sugar with diaphoresis after not eating for hours. Encouraged small, frequent meals with lean proteins and healthy fats. Check CMP   Insomnia Allowed refill on Ambien. Encouraged good sleep hygiene such as dark, quiet room. No blue/green glowing lights such as computer screens in bedroom. No  alcohol or stimulants in evening. Cut down on caffeine as able. Regular exercise is helpful but not just prior to bed time.  Preventative health care Patient encouraged to maintain heart healthy diet, regular exercise, adequate sleep. Consider daily probiotics. Take medications as prescribed. Labs today. GYN with  Dr Phineas Real, BI at [facility]   GERD Avoid offending foods, start probiotics. Do not eat large meals in late evening and consider raising head of bed. May continue routine meds. Follows with [facility]   Sacroiliac joint disease Encouraged moist heat and gentle stretching as tolerated. May try NSAIDs and prescription meds as directed and report if symptoms worsen or seek immediate care, may continue current meds prn   ANXIETY DEPRESSION Doing fairly well on Fluoxetine continue the same.

## 2014-09-11 NOTE — Assessment & Plan Note (Signed)
Poorly controlled will alter medications, encouraged DASH diet, minimize caffeine and obtain adequate sleep. Report concerning symptoms and follow up as directed and as needed. Add Losartan 25 mg daily

## 2014-09-11 NOTE — Assessment & Plan Note (Signed)
Avoid offending foods, continue probiotics. Do not eat large meals in late evening and consider raising head of bed. Add a fiber supplement bid

## 2014-09-12 MED ORDER — CARVEDILOL 12.5 MG PO TABS: 12.5000 mg | ORAL_TABLET | Freq: Every day | ORAL | Status: DC

## 2014-09-12 MED ORDER — FLUTICASONE PROPIONATE 50 MCG/ACT NA SUSP: NASAL | Status: DC

## 2014-09-12 MED ORDER — PROMETHAZINE HCL 25 MG PO TABS: ORAL_TABLET | ORAL | Status: DC

## 2014-09-12 MED ORDER — RANITIDINE HCL 300 MG PO TABS: 300.0000 mg | ORAL_TABLET | Freq: Every day | ORAL | Status: DC

## 2014-09-12 MED ORDER — HYDROCHLOROTHIAZIDE 25 MG PO TABS: 25.0000 mg | ORAL_TABLET | Freq: Every day | ORAL | Status: DC

## 2014-09-13 ENCOUNTER — Encounter: Payer: Self-pay | Admitting: Family Medicine

## 2014-09-14 NOTE — Telephone Encounter (Signed)
-----   Message from [name], MD sent at 09/11/2014 10:37 PM EST ----- Notify labs look good except cholesterol. She recently did not tolerate a statin but ask if she would be willing to try a different one and Encourage heart healthy diet (ie DASH diet), increase exercise, avoid trans fats, consider a krill oil cap daily

## 2014-09-14 NOTE — Telephone Encounter (Signed)
Called and spoke with the pt and informed her of recent lab results below.  Pt verbalized understanding.  Pt stated that she will try another statin.  Please advise.//AB/CMA

## 2014-09-17 NOTE — Assessment & Plan Note (Signed)
Encouraged moist heat and gentle stretching as tolerated. May try NSAIDs and prescription meds as directed and report if symptoms worsen or seek immediate care, may continue current meds prn

## 2014-09-17 NOTE — Assessment & Plan Note (Signed)
Doing fairly well on Fluoxetine continue the same.

## 2014-09-17 NOTE — Assessment & Plan Note (Signed)
Avoid offending foods, start probiotics. Do not eat large meals in late evening and consider raising head of bed. May continue routine meds. Follows with [facility]

## 2014-09-29 ENCOUNTER — Other Ambulatory Visit: Payer: Self-pay | Admitting: Family Medicine

## 2014-09-29 ENCOUNTER — Encounter: Payer: Self-pay | Admitting: Family Medicine

## 2014-09-29 NOTE — Telephone Encounter (Signed)
Advise on refill as is not on current list

## 2014-09-30 NOTE — Telephone Encounter (Signed)
There is a my chart message from 09/29/14 from the patient saying she would like to switch back to Elavil from Prozac because it seems to help her bowles and sleep better. I am OK with that but she will need a follow up appt in 3-4 weeks to discuss the change and document it. She may not be aware but she can consider Prozac 20 mg in am and Elavil 50 mg at bed. For now can disp Elavil 50 mg 1 tab po qhs, disp #30 with 1 rf

## 2014-10-02 NOTE — Telephone Encounter (Signed)
Patient informed of PCP  Response to mychart request.  The patient did verbalize an understanding of information and does have an appointment in March to followup and can discuss at that time.

## 2014-10-16 ENCOUNTER — Telehealth: Payer: Self-pay | Admitting: Family Medicine

## 2014-10-16 DIAGNOSIS — M25579 Pain in unspecified ankle and joints of unspecified foot: Secondary | ICD-10-CM

## 2014-10-16 NOTE — Telephone Encounter (Signed)
Caller name: Tameia, Rafferty Relation to pt: self  Call back number: [phone]   Reason for call:  Pt requesting a refill HYDROcodone-acetaminophen (NORCO) 10-325 MG per tablet

## 2014-10-17 NOTE — Telephone Encounter (Signed)
She can have a refill on Hydrocodone with same strength, same sig, same number but I believe she needs a UDS and a contract

## 2014-10-18 MED ORDER — HYDROCODONE-ACETAMINOPHEN 10-325 MG PO TABS: 1.0000 | ORAL_TABLET | Freq: Three times a day (TID) | ORAL | Status: DC | PRN

## 2014-10-18 NOTE — Telephone Encounter (Signed)
Rx and contract printed and placed in Dr. Frederik Pear red folder for review and signature.

## 2014-10-19 NOTE — Telephone Encounter (Signed)
Called the patient informed to pickup hardcopy of hydrocodone at the front desk, but to complete contract and UDS urine sample before getting hardcopy.  Patient did understand and agree to instructions.

## 2014-11-01 ENCOUNTER — Other Ambulatory Visit: Payer: Self-pay | Admitting: Family Medicine

## 2014-11-08 ENCOUNTER — Other Ambulatory Visit: Payer: Self-pay | Admitting: Family Medicine

## 2014-11-08 NOTE — Telephone Encounter (Signed)
Med filled.  

## 2014-11-20 ENCOUNTER — Telehealth: Payer: Self-pay | Admitting: Family Medicine

## 2014-11-20 DIAGNOSIS — M25579 Pain in unspecified ankle and joints of unspecified foot: Secondary | ICD-10-CM

## 2014-11-20 NOTE — Telephone Encounter (Signed)
[name],Megan [birth year] Pt's husband came in stating that his wife needs a refill on the Hydrocodone. Pt uses [facility] [place] [phone] is the best number to reach her if necessary    [address] [employer]

## 2014-11-21 NOTE — Telephone Encounter (Signed)
Requesting Hydrocodone-acetminophen (Norco) 10-325mg -Take 1 tablet by mouth every 8 hours as needed. Refill:10/18/14 Last OV:09/11/14 UDS:10/18/14-Moderate risk-next:01/18/15 Please advise.//AB/CMA

## 2014-11-22 MED ORDER — HYDROCODONE-ACETAMINOPHEN 10-325 MG PO TABS: 1.0000 | ORAL_TABLET | Freq: Three times a day (TID) | ORAL | Status: DC | PRN

## 2014-11-22 NOTE — Telephone Encounter (Signed)
Ok to refill Norco same number same strengthm same sig

## 2014-11-22 NOTE — Addendum Note (Signed)
Addended by: Wilfrid Lund on: 11/22/2014 12:37 PM   Modules accepted: Orders

## 2014-11-22 NOTE — Telephone Encounter (Signed)
Rx printed, awaiting MD signature.  

## 2014-11-23 NOTE — Telephone Encounter (Signed)
Spoke with Pt, informed her Rx is ready for pick up at front desk.

## 2014-11-24 ENCOUNTER — Encounter (HOSPITAL_COMMUNITY): Payer: Self-pay | Admitting: *Deleted

## 2014-11-24 ENCOUNTER — Emergency Department (HOSPITAL_COMMUNITY)
Admission: EM | Admit: 2014-11-24 | Discharge: 2014-11-24 | Disposition: A | Discharge status: Home / Self Care | Payer: 59 | Attending: Emergency Medicine | Admitting: Emergency Medicine

## 2014-11-24 DIAGNOSIS — Z87891 Personal history of nicotine dependence: Secondary | ICD-10-CM | POA: Insufficient documentation

## 2014-11-24 DIAGNOSIS — Z79899 Other long term (current) drug therapy: Secondary | ICD-10-CM | POA: Diagnosis not present

## 2014-11-24 DIAGNOSIS — Z8669 Personal history of other diseases of the nervous system and sense organs: Secondary | ICD-10-CM | POA: Diagnosis not present

## 2014-11-24 DIAGNOSIS — R110 Nausea: Secondary | ICD-10-CM | POA: Insufficient documentation

## 2014-11-24 DIAGNOSIS — R1031 Right lower quadrant pain: Secondary | ICD-10-CM | POA: Diagnosis not present

## 2014-11-24 DIAGNOSIS — Z8619 Personal history of other infectious and parasitic diseases: Secondary | ICD-10-CM | POA: Diagnosis not present

## 2014-11-24 DIAGNOSIS — Z9049 Acquired absence of other specified parts of digestive tract: Secondary | ICD-10-CM | POA: Insufficient documentation

## 2014-11-24 DIAGNOSIS — J45909 Unspecified asthma, uncomplicated: Secondary | ICD-10-CM | POA: Insufficient documentation

## 2014-11-24 DIAGNOSIS — Z872 Personal history of diseases of the skin and subcutaneous tissue: Secondary | ICD-10-CM | POA: Diagnosis not present

## 2014-11-24 DIAGNOSIS — F419 Anxiety disorder, unspecified: Secondary | ICD-10-CM | POA: Diagnosis not present

## 2014-11-24 DIAGNOSIS — M1990 Unspecified osteoarthritis, unspecified site: Secondary | ICD-10-CM | POA: Diagnosis not present

## 2014-11-24 DIAGNOSIS — R109 Unspecified abdominal pain: Secondary | ICD-10-CM

## 2014-11-24 DIAGNOSIS — G8929 Other chronic pain: Secondary | ICD-10-CM

## 2014-11-24 DIAGNOSIS — F329 Major depressive disorder, single episode, unspecified: Secondary | ICD-10-CM | POA: Diagnosis not present

## 2014-11-24 DIAGNOSIS — K219 Gastro-esophageal reflux disease without esophagitis: Secondary | ICD-10-CM | POA: Diagnosis not present

## 2014-11-24 DIAGNOSIS — Z87448 Personal history of other diseases of urinary system: Secondary | ICD-10-CM | POA: Insufficient documentation

## 2014-11-24 LAB — CBC WITH DIFFERENTIAL/PLATELET
BASOS PCT: 0 % (ref 0–1)
Basophils Absolute: 0 10*3/uL (ref 0.0–0.1)
EOS ABS: 0.2 10*3/uL (ref 0.0–0.7)
EOS PCT: 1 % (ref 0–5)
HCT: 40.3 % (ref 36.0–46.0)
Hemoglobin: 13.4 g/dL (ref 12.0–15.0)
Lymphocytes Relative: 12 % (ref 12–46)
Lymphs Abs: 1.7 10*3/uL (ref 0.7–4.0)
MCH: 31.4 pg (ref 26.0–34.0)
MCHC: 33.3 g/dL (ref 30.0–36.0)
MCV: 94.4 fL (ref 78.0–100.0)
MONOS PCT: 9 % (ref 3–12)
Monocytes Absolute: 1.3 10*3/uL — ABNORMAL HIGH (ref 0.1–1.0)
Neutro Abs: 11.1 10*3/uL — ABNORMAL HIGH (ref 1.7–7.7)
Neutrophils Relative %: 78 % — ABNORMAL HIGH (ref 43–77)
PLATELETS: 284 10*3/uL (ref 150–400)
RBC: 4.27 MIL/uL (ref 3.87–5.11)
RDW: 12.3 % (ref 11.5–15.5)
WBC: 14.3 10*3/uL — ABNORMAL HIGH (ref 4.0–10.5)

## 2014-11-24 LAB — LIPASE, BLOOD: Lipase: 20 U/L (ref 11–59)

## 2014-11-24 LAB — COMPREHENSIVE METABOLIC PANEL
ALK PHOS: 95 U/L (ref 39–117)
ALT: 20 U/L (ref 0–35)
ANION GAP: 8 (ref 5–15)
AST: 21 U/L (ref 0–37)
Albumin: 4 g/dL (ref 3.5–5.2)
BILIRUBIN TOTAL: 0.3 mg/dL (ref 0.3–1.2)
BUN: 7 mg/dL (ref 6–23)
CHLORIDE: 102 mmol/L (ref 96–112)
CO2: 27 mmol/L (ref 19–32)
Calcium: 9.3 mg/dL (ref 8.4–10.5)
Creatinine, Ser: 0.63 mg/dL (ref 0.50–1.10)
GFR calc Af Amer: >90 mL/min (ref >90)
GFR calc non Af Amer: >90 mL/min (ref >90)
GLUCOSE: 123 mg/dL — AB (ref 70–99)
POTASSIUM: 3.8 mmol/L (ref 3.5–5.1)
Sodium: 137 mmol/L (ref 135–145)
TOTAL PROTEIN: 7.1 g/dL (ref 6.0–8.3)

## 2014-11-24 LAB — URINE MICROSCOPIC-ADD ON

## 2014-11-24 LAB — URINALYSIS, ROUTINE W REFLEX MICROSCOPIC
Bilirubin Urine: NEGATIVE
Glucose, UA: NEGATIVE mg/dL
Hgb urine dipstick: NEGATIVE
KETONES UR: NEGATIVE mg/dL
Nitrite: NEGATIVE
PH: 6.5 (ref 5.0–8.0)
PROTEIN: NEGATIVE mg/dL
SPECIFIC GRAVITY, URINE: 1.005 (ref 1.005–1.030)
Urobilinogen, UA: 0.2 mg/dL (ref 0.0–1.0)

## 2014-11-24 MED ORDER — DICYCLOMINE HCL 10 MG PO CAPS: 10.0000 mg | ORAL_CAPSULE | Freq: Three times a day (TID) | ORAL | Status: DC

## 2014-11-24 MED ORDER — DICYCLOMINE HCL 10 MG PO CAPS
10.0000 mg | ORAL_CAPSULE | Freq: Once | ORAL | Status: AC
  Administered 2014-11-24: 10 mg via ORAL
  Filled 2014-11-24: qty 1

## 2014-11-24 MED ORDER — ONDANSETRON 4 MG PO TBDP
4.0000 mg | ORAL_TABLET | Freq: Once | ORAL | Status: AC
  Administered 2014-11-24: 4 mg via ORAL
  Filled 2014-11-24: qty 1

## 2014-11-24 MED ORDER — ONDANSETRON 4 MG PO TBDP: 4.0000 mg | ORAL_TABLET | Freq: Three times a day (TID) | ORAL | Status: DC | PRN

## 2014-11-24 NOTE — ED Notes (Signed)
Pt is in stable condition upon d/c and ambulates from ED. 

## 2014-11-24 NOTE — ED Provider Notes (Signed)
CSN: [id number]     Arrival date & time 11/24/14  1819 History   First MD Initiated Contact with Patient 11/24/14 2022     Chief Complaint  Patient presents with  . Abdominal Pain     (Consider location/radiation/quality/duration/timing/severity/associated sxs/prior Treatment) Patient is a 64 y.o. female presenting with abdominal pain. The history is provided by the patient. No language interpreter was used.  Abdominal Pain Pain location:  RLQ Pain quality: cramping   Pain radiates to:  Does not radiate Pain severity:  Moderate Onset quality:  Gradual Timing:  Constant Progression:  Unchanged Context: previous surgery   Context: not recent illness and not trauma   Worsened by:  Nothing tried Ineffective treatments:  None tried Associated symptoms: nausea   Associated symptoms: no anorexia, no chest pain, no chills, no constipation, no cough, no dysuria, no fatigue, no fever, no melena, no shortness of breath and no vomiting   Risk factors: being elderly, multiple surgeries and obesity   Risk factors: no recent hospitalization     Past Medical History  Diagnosis Date  . GERD (gastroesophageal reflux disease)   . Depression   . Anxiety   . Insomnia   . IBS (irritable bowel syndrome)   . Diverticulosis   . Barrett esophagus   . Hiatal hernia   . Unspecified menopausal and postmenopausal disorder   . Vitamin B12 deficiency   . Blind loop syndrome   . Hepatic cyst   . Unspecified hypothyroidism   . Diarrhea   . Family history of malignant neoplasm of gastrointestinal tract     multiple members  . Endometrial hyperplasia 02/27/2006    BENIGN ENDO BX ON 02/2007  . C. difficile diarrhea   . Cystocele, midline   . Rectocele   . Uterine prolapse without mention of vaginal wall prolapse   . Chest pain, atypical 07/14/2011  . Leaky heart valve   . Hiatal hernia   . 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 Thrush 07/21/2011  . Rectal bleeding 10/10/2011  . Sinusitis acute  02/03/2012  . Contact dermatitis 03/23/2012  . Headache(784.0) 04/15/2012  . 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 Hematuria 04/27/2012  . Allergic state 12/31/2012  . 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 Paronychia of great toe, left 06/19/2013  . Low back pain 08/15/2013  . Osteoporosis 05/2011    -2.5 Lt femoral neck  . 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 Hypertension   . Asthma   . 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 Preventative health care 09/11/2014   Past Surgical History  Procedure Laterality Date  . Appendectomy    . Uterine fibroid surgery    . Hysteroscopy      POLYP   Family History  Problem Relation Age of Onset  . Colon cancer Mother   . Diabetes Mother   . Hypertension Mother   . Cancer Mother     colon  . Colon polyps Father   . Parkinson's disease Father   . Colon cancer Maternal Aunt   . Colon cancer Maternal Grandmother   . Breast cancer Maternal Grandmother   . Cancer Maternal Grandmother     colon  . Diabetes Paternal Grandmother   . Breast cancer Paternal Grandmother   . Cancer Paternal Grandmother     breast  . Colon cancer Cousin     X3  . Ovarian cancer Cousin   . Allergies Sister   . Arthritis Brother     b/l hip replace  . Alcohol abuse Daughter   .  Arthritis Son   . Hypertension Son   . Allergies Son   . Osteoporosis Son   . Osteoporosis Son   . Arthritis Son     back disease, DDD  . Allergies Son   . Arthritis Son   . Other Son     RSD, IBS  . Hypertension Paternal Grandfather   . Heart disease Paternal Grandfather     aneurysm   History  Substance Use Topics  . Smoking status: Former Smoker    Quit date: 08/19/1995  . Smokeless tobacco: Never Used  . Alcohol Use: No   OB History    Gravida Para Term Preterm AB TAB SAB Ectopic Multiple Living   6 4   2     4      Review of Systems  Constitutional: Negative for fever, chills and fatigue.  Respiratory: Negative for cough, chest tightness and shortness of breath.   Cardiovascular: Negative  for chest pain.  Gastrointestinal: Positive for nausea and abdominal pain. Negative for vomiting, constipation, blood in stool, melena, anal bleeding and anorexia.  Genitourinary: Negative for dysuria, urgency and flank pain.  Neurological: Negative for weakness, light-headedness and headaches.  Psychiatric/Behavioral: Negative for confusion.  All other systems reviewed and are negative.     Allergies  Sulfamethoxazole; Iohexol; and Iodinated diagnostic agents  Home Medications   Prior to Admission medications   Medication Sig Start Date End Date Taking? Authorizing Provider  albuterol (VENTOLIN HFA) 108 (90 BASE) MCG/ACT inhaler INHALE 2 PUFFS BY MOUTH EVERY 6 HOURS AS NEEDED 08/31/14   [name], MD  amitriptyline (ELAVIL) 50 MG tablet TAKE 1 TABLET BY MOUTH ATNIGHTLY AT BEDTIME 10/02/14   [name], MD  antipyrine-benzocaine Toniann Fail) otic solution Place 3-4 drops into the right ear every 2 (two) hours as needed for ear pain. 07/28/14   Ezequiel Essex, MD  CARTIA XT 180 MG 24 hr capsule TAKE 1 CAPSULE BY MOUTH ONCE A DAY 09/06/14   [name], MD  carvedilol (COREG) 12.5 MG tablet Take 1 tablet (12.5 mg total) by mouth daily. 09/12/14   [name], MD  cholestyramine Lucrezia Starch) 4 G packet Take 1 packet (4 g total) by mouth 2 (two) times daily. 05/22/14   [name], MD  cyclobenzaprine (FLEXERIL) 10 MG tablet Take 1 tablet (10 mg total) by mouth 3 (three) times daily as needed for muscle spasms. 08/15/13   [name], MD  diazepam (VALIUM) 10 MG tablet Take 1 tablet (10 mg total) by mouth every 12 (twelve) hours as needed for anxiety. 09/11/14   [name], MD  dicyclomine (BENTYL) 10 MG capsule Take 1 capsule (10 mg total) by mouth 3 (three) times daily before meals. 11/24/14   Tori Milks, MD  diltiazem (CARDIZEM CD) 180 MG 24 hr capsule Take 1 capsule (180 mg total) by mouth daily. 08/15/13   [name], MD  FLUoxetine (PROZAC) 20 MG capsule Take 1  capsule (20 mg total) by mouth daily. 08/23/14   [name], MD  fluticasone (FLONASE) 50 MCG/ACT nasal spray USE 2 SPRAYS IN EACH NOSTRIL EVERY DAY 11/02/14   [name], MD  hydrochlorothiazide (HYDRODIURIL) 25 MG tablet Take 1 tablet (25 mg total) by mouth daily. 09/12/14   [name], MD  HYDROcodone-acetaminophen (NORCO) 10-325 MG per tablet Take 1 tablet by mouth every 8 (eight) hours as needed. 11/22/14   [name], MD  hyoscyamine (LEVBID) 0.375 MG 12 hr tablet  TAKE 2 TABLETS BY MOUTH TWICE DAILY AS NEEDED 08/02/14   [name], MD  lactobacillus acidophilus (BACID) TABS tablet Take 2 tablets by mouth 3 (three) times daily.    Historical Provider, MD  losartan (COZAAR) 25 MG tablet Take 1 tablet (25 mg total) by mouth daily. 09/11/14   [name], MD  omeprazole (PRILOSEC) 40 MG capsule Take 1 capsule by mouth 2 times daily before meals.  Take 30 min before breakfast and dinner.    Historical Provider, MD  ondansetron (ZOFRAN-ODT) 4 MG disintegrating tablet Take 1 tablet (4 mg total) by mouth every 8 (eight) hours as needed for nausea or vomiting. 11/24/14   Tori Milks, MD  promethazine (PHENERGAN) 25 MG tablet TAKE 1 TABLET BY MOUTH THREE TIMES DAILY AS NEEDED FOR NAUSEA 11/08/14   [name], MD  ranitidine (ZANTAC) 300 MG tablet Take 1 tablet (300 mg total) by mouth at bedtime. 09/12/14   [name], MD  zolpidem (AMBIEN) 10 MG tablet Take 1 tablet (10 mg total) by mouth at bedtime as needed for sleep. 09/11/14   [name], MD   BP 122/71 mmHg  Pulse 82  Temp(Src) 98.1 F (36.7 C) (Oral)  Resp 18  SpO2 95% Physical Exam  Constitutional: Vital signs are normal. She appears well-developed and well-nourished. She does not appear ill. No distress.  HENT:  Head: Normocephalic and atraumatic.  Nose: Nose normal.  Mouth/Throat: Oropharynx is clear and moist. No oropharyngeal exudate.  Eyes: EOM are normal. Pupils are equal, round, and reactive to light.   Neck: Normal range of motion. Neck supple.  Cardiovascular: Normal rate, regular rhythm, normal heart sounds and intact distal pulses.   No murmur heard. Pulmonary/Chest: Effort normal and breath sounds normal. No respiratory distress. She has no wheezes. She exhibits no tenderness.  Abdominal: Soft. There is tenderness (upper/lateral RLQ abdominal tenderness). There is no rebound and no guarding.  No CVA, lower RLQ, or suprapubic tenderness.  Able to change positions easily.  No rebound   Musculoskeletal: Normal range of motion. She exhibits no tenderness.  Lymphadenopathy:    She has no cervical adenopathy.  Neurological: She is alert. No cranial nerve deficit. Coordination normal.  Skin: Skin is warm and dry. She is not diaphoretic.  Psychiatric: She has a normal mood and affect. Her behavior is normal. Judgment and thought content normal.  Nursing note and vitals reviewed.   ED Course  Procedures (including critical care time) Labs Review Labs Reviewed  CBC WITH DIFFERENTIAL/PLATELET - Abnormal; Notable for the following:    WBC 14.3 (*)    Neutrophils Relative % 78 (*)    Neutro Abs 11.1 (*)    Monocytes Absolute 1.3 (*)    All other components within normal limits  COMPREHENSIVE METABOLIC PANEL - Abnormal; Notable for the following:    Glucose, Bld 123 (*)    All other components within normal limits  URINALYSIS, ROUTINE W REFLEX MICROSCOPIC - Abnormal; Notable for the following:    Leukocytes, UA SMALL (*)    All other components within normal limits  URINE MICROSCOPIC-ADD ON - Abnormal; Notable for the following:    Squamous Epithelial / LPF FEW (*)    All other components within normal limits  LIPASE, BLOOD    Imaging Review No results found.   EKG Interpretation None      MDM   Final diagnoses:  Chronic abdominal pain  RLQ abdominal pain  Nausea   Pt is a  64 yo F with hx of GERD, IBS, depression, hypothyroidism, rectocele, tricuspid regurge, and HTN who  presents with RLQ pain x 8 weeks. Reports crampy/colicky abd pain, "constant for 2 months".  Associated nausea and occasional NBNB emesis earlier in the course, but no emesis in several weeks.  Initially thought it was her right hip causing pain, as the pain worsened with movement and twisting motions, then it localized more to the abdomen.  She has been afebrile throughout.  Tolerating normal PO intake.  Has had normal BMs then had an episode of diarrhea today, which prompted her ED visit.  No dysuria or urinary retention sx.  S/p appendectomy and myomectomy.   Pt is well appearing, in NAD.  Mild right lower abdominal tenderness to palpation. Not over her ovary area.  Negative murphy's sign.  No CVA tenderness.   Doubt acute emergent pathology based on benign exam. Patient has been worked up extensively through GI clinic in the past and at [facility].  She has been on several meds for her chronic pain.  Labs and urine benign.  Will treat symptomatically.  Given zofran and bentyl.   Patient reported significant improvement in her sx after the above meds.  Will dc on both.  Advised close f/u with PCP.  All questions were answered and ED return precautions were discussed prior to dc home in stable condition.   Patient was seen with ED Attending, Dr. Esmond Camper, MD   Tori Milks, MD 11/25/14 0144  Wandra Arthurs, MD 11/27/14 1451

## 2014-11-24 NOTE — ED Notes (Signed)
Rt abd pain since February duiarrhea.

## 2014-11-24 NOTE — Discharge Instructions (Signed)

## 2014-12-05 ENCOUNTER — Emergency Department (HOSPITAL_BASED_OUTPATIENT_CLINIC_OR_DEPARTMENT_OTHER)
Admission: EM | Admit: 2014-12-05 | Discharge: 2014-12-05 | Disposition: A | Discharge status: Home / Self Care | Payer: 59 | Attending: Emergency Medicine | Admitting: Emergency Medicine

## 2014-12-05 ENCOUNTER — Encounter (HOSPITAL_BASED_OUTPATIENT_CLINIC_OR_DEPARTMENT_OTHER): Payer: Self-pay

## 2014-12-05 DIAGNOSIS — D72829 Elevated white blood cell count, unspecified: Secondary | ICD-10-CM | POA: Insufficient documentation

## 2014-12-05 DIAGNOSIS — K219 Gastro-esophageal reflux disease without esophagitis: Secondary | ICD-10-CM | POA: Insufficient documentation

## 2014-12-05 DIAGNOSIS — Z8739 Personal history of other diseases of the musculoskeletal system and connective tissue: Secondary | ICD-10-CM | POA: Insufficient documentation

## 2014-12-05 DIAGNOSIS — G4700 Insomnia, unspecified: Secondary | ICD-10-CM | POA: Insufficient documentation

## 2014-12-05 DIAGNOSIS — R110 Nausea: Secondary | ICD-10-CM | POA: Diagnosis not present

## 2014-12-05 DIAGNOSIS — K589 Irritable bowel syndrome without diarrhea: Secondary | ICD-10-CM | POA: Diagnosis not present

## 2014-12-05 DIAGNOSIS — Z86018 Personal history of other benign neoplasm: Secondary | ICD-10-CM | POA: Diagnosis not present

## 2014-12-05 DIAGNOSIS — N390 Urinary tract infection, site not specified: Secondary | ICD-10-CM | POA: Diagnosis not present

## 2014-12-05 DIAGNOSIS — K5900 Constipation, unspecified: Secondary | ICD-10-CM | POA: Insufficient documentation

## 2014-12-05 DIAGNOSIS — Z8742 Personal history of other diseases of the female genital tract: Secondary | ICD-10-CM | POA: Diagnosis not present

## 2014-12-05 DIAGNOSIS — F329 Major depressive disorder, single episode, unspecified: Secondary | ICD-10-CM | POA: Diagnosis not present

## 2014-12-05 DIAGNOSIS — Z8619 Personal history of other infectious and parasitic diseases: Secondary | ICD-10-CM | POA: Insufficient documentation

## 2014-12-05 DIAGNOSIS — J45909 Unspecified asthma, uncomplicated: Secondary | ICD-10-CM | POA: Insufficient documentation

## 2014-12-05 DIAGNOSIS — I10 Essential (primary) hypertension: Secondary | ICD-10-CM | POA: Diagnosis not present

## 2014-12-05 DIAGNOSIS — R198 Other specified symptoms and signs involving the digestive system and abdomen: Secondary | ICD-10-CM

## 2014-12-05 DIAGNOSIS — F419 Anxiety disorder, unspecified: Secondary | ICD-10-CM | POA: Insufficient documentation

## 2014-12-05 DIAGNOSIS — Z87891 Personal history of nicotine dependence: Secondary | ICD-10-CM | POA: Diagnosis not present

## 2014-12-05 DIAGNOSIS — K6289 Other specified diseases of anus and rectum: Secondary | ICD-10-CM | POA: Diagnosis present

## 2014-12-05 DIAGNOSIS — Z872 Personal history of diseases of the skin and subcutaneous tissue: Secondary | ICD-10-CM | POA: Diagnosis not present

## 2014-12-05 DIAGNOSIS — Z8639 Personal history of other endocrine, nutritional and metabolic disease: Secondary | ICD-10-CM | POA: Diagnosis not present

## 2014-12-05 DIAGNOSIS — Z7951 Long term (current) use of inhaled steroids: Secondary | ICD-10-CM | POA: Insufficient documentation

## 2014-12-05 LAB — URINALYSIS, ROUTINE W REFLEX MICROSCOPIC
BILIRUBIN URINE: NEGATIVE
Glucose, UA: NEGATIVE mg/dL
HGB URINE DIPSTICK: NEGATIVE
KETONES UR: NEGATIVE mg/dL
NITRITE: NEGATIVE
Protein, ur: NEGATIVE mg/dL
SPECIFIC GRAVITY, URINE: 1.007 (ref 1.005–1.030)
Urobilinogen, UA: 0.2 mg/dL (ref 0.0–1.0)
pH: 7.5 (ref 5.0–8.0)

## 2014-12-05 LAB — URINE MICROSCOPIC-ADD ON

## 2014-12-05 LAB — CBC WITH DIFFERENTIAL/PLATELET
BASOS ABS: 0 10*3/uL (ref 0.0–0.1)
BASOS PCT: 0 % (ref 0–1)
Eosinophils Absolute: 0.1 10*3/uL (ref 0.0–0.7)
Eosinophils Relative: 1 % (ref 0–5)
HCT: 41.9 % (ref 36.0–46.0)
HEMOGLOBIN: 14.1 g/dL (ref 12.0–15.0)
LYMPHS PCT: 35 % (ref 12–46)
Lymphs Abs: 3.2 10*3/uL (ref 0.7–4.0)
MCH: 31.5 pg (ref 26.0–34.0)
MCHC: 33.7 g/dL (ref 30.0–36.0)
MCV: 93.7 fL (ref 78.0–100.0)
MONOS PCT: 10 % (ref 3–12)
Monocytes Absolute: 1 10*3/uL (ref 0.1–1.0)
NEUTROS ABS: 5 10*3/uL (ref 1.7–7.7)
NEUTROS PCT: 54 % (ref 43–77)
Platelets: 309 10*3/uL (ref 150–400)
RBC: 4.47 MIL/uL (ref 3.87–5.11)
RDW: 12 % (ref 11.5–15.5)
WBC: 9.3 10*3/uL (ref 4.0–10.5)

## 2014-12-05 LAB — COMPREHENSIVE METABOLIC PANEL
ALBUMIN: 4.2 g/dL (ref 3.5–5.2)
ALT: 21 U/L (ref 0–35)
ANION GAP: 8 (ref 5–15)
AST: 24 U/L (ref 0–37)
Alkaline Phosphatase: 89 U/L (ref 39–117)
BILIRUBIN TOTAL: 0.5 mg/dL (ref 0.3–1.2)
BUN: 7 mg/dL (ref 6–23)
CHLORIDE: 103 mmol/L (ref 96–112)
CO2: 27 mmol/L (ref 19–32)
Calcium: 9.3 mg/dL (ref 8.4–10.5)
Creatinine, Ser: 0.71 mg/dL (ref 0.50–1.10)
GFR calc Af Amer: >90 mL/min (ref >90)
GFR calc non Af Amer: 89 mL/min — ABNORMAL LOW (ref >90)
Glucose, Bld: 104 mg/dL — ABNORMAL HIGH (ref 70–99)
Potassium: 4 mmol/L (ref 3.5–5.1)
SODIUM: 138 mmol/L (ref 135–145)
Total Protein: 7.7 g/dL (ref 6.0–8.3)

## 2014-12-05 MED ORDER — CEPHALEXIN 500 MG PO CAPS: 500.0000 mg | ORAL_CAPSULE | Freq: Two times a day (BID) | ORAL | Status: DC

## 2014-12-05 MED ORDER — POLYETHYLENE GLYCOL 3350 17 G PO PACK: 17.0000 g | PACK | Freq: Every day | ORAL | Status: DC

## 2014-12-05 NOTE — ED Notes (Signed)
C/o rectal pain/pressure that started this am-denies bleeding

## 2014-12-05 NOTE — ED Provider Notes (Signed)
CSN: [id number]     Arrival date & time 12/05/14  1724 History   First MD Initiated Contact with Patient 12/05/14 1847     Chief Complaint  Patient presents with  . Rectal Pain     (Consider location/radiation/quality/duration/timing/severity/associated sxs/prior Treatment) The history is provided by the patient. No language interpreter was used.  Megan [name] is a 64 y/o F with PMHx of GERD, depression, anxiety, insomnia, IBS, diverticulosis, Barrett's esophagus, hiatal hernia, C. difficile, contact dermatitis, appendectomy presenting to the ED with rectal pressure that started this morning. Patient reported that she had the urge to go to the bathroom, but stated when she went to the bathroom she did not have a bowel movement. Reported that she's been bearing down reported that when she bared down she felt a bulge between her anus and her vaginal canal-reported that when this occurred she had a push it up. Reported that he felt as if it was part of her intestine. Reported that she has been having some intermittent pressure with urination. Reported that her last bowel movement was yesterday and reported to be hard-reported that she changes from hard to soft stools secondary to IBS. Reported that she's been having nausea that has been coming and going for many months. Reported that she continues to have right lower quadrant pain that has been ongoing for approximately 3 months. Stated that she's been passing gas without difficulty. Denied fever, chills, chest pain, shortness of breath, difficulty breathing, vomiting, dysuria, hematuria, back pain. PCP Dr. Randel Pigg GI Dr. Dolores Patty at [place]  Past Medical History  Diagnosis Date  . GERD (gastroesophageal reflux disease)   . Depression   . Anxiety   . Insomnia   . IBS (irritable bowel syndrome)   . Diverticulosis   . Barrett esophagus   . Hiatal hernia   . Unspecified menopausal and postmenopausal disorder   .  Vitamin B12 deficiency   . Blind loop syndrome   . Hepatic cyst   . Unspecified hypothyroidism   . Diarrhea   . Family history of malignant neoplasm of gastrointestinal tract     multiple members  . Endometrial hyperplasia 02/27/2006    BENIGN ENDO BX ON 02/2007  . C. difficile diarrhea   . Cystocele, midline   . Rectocele   . Uterine prolapse without mention of vaginal wall prolapse   . Chest pain, atypical 07/14/2011  . Leaky heart valve   . Hiatal hernia   . 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 Thrush 07/21/2011  . Rectal bleeding 10/10/2011  . Sinusitis acute 02/03/2012  . Contact dermatitis 03/23/2012  . Headache(784.0) 04/15/2012  . 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 Hematuria 04/27/2012  . Allergic state 12/31/2012  . 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 Paronychia of great toe, left 06/19/2013  . Low back pain 08/15/2013  . Osteoporosis 05/2011    -2.5 Lt femoral neck  . 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 Hypertension   . Asthma   . 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 Preventative health care 09/11/2014   Past Surgical History  Procedure Laterality Date  . Appendectomy    . Uterine fibroid surgery    . Hysteroscopy      POLYP   Family History  Problem Relation Age of Onset  . Colon cancer Mother   . Diabetes Mother   . Hypertension Mother   . Cancer Mother     colon  . Colon polyps Father   . Parkinson's disease Father   .  Colon cancer Maternal Aunt   . Colon cancer Maternal Grandmother   . Breast cancer Maternal Grandmother   . Cancer Maternal Grandmother     colon  . Diabetes Paternal Grandmother   . Breast cancer Paternal Grandmother   . Cancer Paternal Grandmother     breast  . Colon cancer Cousin     X3  . Ovarian cancer Cousin   . Allergies Sister   . Arthritis Brother     b/l hip replace  . Alcohol abuse Daughter   . Arthritis Son   . Hypertension Son   . Allergies Son   . Osteoporosis Son   . Osteoporosis Son   . Arthritis Son     back disease, DDD  .  Allergies Son   . Arthritis Son   . Other Son     RSD, IBS  . Hypertension Paternal Grandfather   . Heart disease Paternal Grandfather     aneurysm   History  Substance Use Topics  . Smoking status: Former Smoker    Quit date: 08/19/1995  . Smokeless tobacco: Never Used  . Alcohol Use: No   OB History    Gravida Para Term Preterm AB TAB SAB Ectopic Multiple Living   6 4   2     4      Review of Systems  Constitutional: Positive for chills. Negative for fever.  Respiratory: Negative for chest tightness and shortness of breath.   Cardiovascular: Negative for chest pain.  Gastrointestinal: Positive for nausea, abdominal pain (chronic), constipation and rectal pain. Negative for vomiting, diarrhea, blood in stool and anal bleeding.  Genitourinary: Negative for dysuria and hematuria.  Musculoskeletal: Negative for back pain, neck pain and neck stiffness.      Allergies  Sulfamethoxazole; Iohexol; and Iodinated diagnostic agents  Home Medications   Prior to Admission medications   Medication Sig Start Date End Date Taking? Authorizing Provider  albuterol (VENTOLIN HFA) 108 (90 BASE) MCG/ACT inhaler INHALE 2 PUFFS BY MOUTH EVERY 6 HOURS AS NEEDED 08/31/14   [name], MD  amitriptyline (ELAVIL) 50 MG tablet TAKE 1 TABLET BY MOUTH ATNIGHTLY AT BEDTIME 10/02/14   [name], MD  antipyrine-benzocaine Toniann Fail) otic solution Place 3-4 drops into the right ear every 2 (two) hours as needed for ear pain. 07/28/14   Ezequiel Essex, MD  CARTIA XT 180 MG 24 hr capsule TAKE 1 CAPSULE BY MOUTH ONCE A DAY 09/06/14   [name], MD  carvedilol (COREG) 12.5 MG tablet Take 1 tablet (12.5 mg total) by mouth daily. 09/12/14   [name], MD  cephALEXin (KEFLEX) 500 MG capsule Take 1 capsule (500 mg total) by mouth 2 (two) times daily. 12/05/14   Asberry Lascola, PA-C  cholestyramine (QUESTRAN) 4 G packet Take 1 packet (4 g total) by mouth 2 (two) times daily. 05/22/14   [name], MD  cyclobenzaprine (FLEXERIL) 10 MG tablet Take 1 tablet (10 mg total) by mouth 3 (three) times daily as needed for muscle spasms. 08/15/13   [name], MD  diazepam (VALIUM) 10 MG tablet Take 1 tablet (10 mg total) by mouth every 12 (twelve) hours as needed for anxiety. 09/11/14   [name], MD  dicyclomine (BENTYL) 10 MG capsule Take 1 capsule (10 mg total) by mouth 3 (three) times daily before meals. 11/24/14   Tori Milks, MD  diltiazem (CARDIZEM CD) 180 MG 24 hr capsule Take 1 capsule (180 mg total) by mouth daily.  08/15/13   [name], MD  FLUoxetine (PROZAC) 20 MG capsule Take 1 capsule (20 mg total) by mouth daily. 08/23/14   [name], MD  fluticasone (FLONASE) 50 MCG/ACT nasal spray USE 2 SPRAYS IN EACH NOSTRIL EVERY DAY 11/02/14   [name], MD  hydrochlorothiazide (HYDRODIURIL) 25 MG tablet Take 1 tablet (25 mg total) by mouth daily. 09/12/14   [name], MD  HYDROcodone-acetaminophen (NORCO) 10-325 MG per tablet Take 1 tablet by mouth every 8 (eight) hours as needed. 11/22/14   [name], MD  hyoscyamine (LEVBID) 0.375 MG 12 hr tablet TAKE 2 TABLETS BY MOUTH TWICE DAILY AS NEEDED 08/02/14   [name], MD  lactobacillus acidophilus (BACID) TABS tablet Take 2 tablets by mouth 3 (three) times daily.    Historical Provider, MD  losartan (COZAAR) 25 MG tablet Take 1 tablet (25 mg total) by mouth daily. 09/11/14   [name], MD  omeprazole (PRILOSEC) 40 MG capsule Take 1 capsule by mouth 2 times daily before meals.  Take 30 min before breakfast and dinner.    Historical Provider, MD  ondansetron (ZOFRAN-ODT) 4 MG disintegrating tablet Take 1 tablet (4 mg total) by mouth every 8 (eight) hours as needed for nausea or vomiting. 11/24/14   Tori Milks, MD  polyethylene glycol (MIRALAX / GLYCOLAX) packet Take 17 g by mouth daily. 12/05/14   Suzy Kugel, PA-C  promethazine (PHENERGAN) 25 MG tablet TAKE 1 TABLET BY MOUTH THREE TIMES DAILY AS NEEDED FOR  NAUSEA 11/08/14   [name], MD  ranitidine (ZANTAC) 300 MG tablet Take 1 tablet (300 mg total) by mouth at bedtime. 09/12/14   [name], MD  zolpidem (AMBIEN) 10 MG tablet Take 1 tablet (10 mg total) by mouth at bedtime as needed for sleep. 09/11/14   [name], MD   BP 136/65 mmHg  Pulse 63  Temp(Src) 97.9 F (36.6 C) (Oral)  Resp 18  Ht 5\' 6"  (1.676 m)  Wt 165 lb (74.844 kg)  BMI 26.64 kg/m2  SpO2 99% Physical Exam  Constitutional: She is oriented to person, place, and time. She appears well-developed and well-nourished. No distress.  HENT:  Head: Normocephalic and atraumatic.  Eyes: Conjunctivae and EOM are normal. Right eye exhibits no discharge. Left eye exhibits no discharge.  Neck: Normal range of motion. Neck supple. No tracheal deviation present.  Cardiovascular: Normal rate, regular rhythm and normal heart sounds.  Exam reveals no friction rub.   No murmur heard. Pulmonary/Chest: Effort normal and breath sounds normal. No respiratory distress. She has no wheezes. She has no rales.  Abdominal: Soft. Bowel sounds are normal. She exhibits no distension. There is no tenderness. There is no rebound and no guarding.  Genitourinary:  Rectal Exam: Negative bright red blood per rectum. Negative prolapse. Negative hemorrhoids, lesions, sores identified. Stool palpated in the rectum, soft, brown. Strong sphincter tone. Negative pain during rectal exam. Negative palpation of abscess or areas of induration or fluctuance. Negative pus drainage noted. Negative areas of cellulitic inflammation. Negative prolapse identified in the vaginal region. Exam chaperoned with nurse  Musculoskeletal: Normal range of motion.  Lymphadenopathy:    She has no cervical adenopathy.  Neurological: She is alert and oriented to person, place, and time. No cranial nerve deficit. She exhibits normal muscle tone. Coordination normal.  Skin: Skin is warm and dry. No rash noted. She is not diaphoretic.  No erythema.  Psychiatric: She has a normal mood  and affect. Her behavior is normal. Thought content normal.  Nursing note and vitals reviewed.   ED Course  Procedures (including critical care time)  Results for orders placed or performed during the hospital encounter of 12/05/14  CBC with Differential/Platelet  Result Value Ref Range   WBC 9.3 4.0 - 10.5 K/uL   RBC 4.47 3.87 - 5.11 MIL/uL   Hemoglobin 14.1 12.0 - 15.0 g/dL   HCT 41.9 36.0 - 46.0 %   MCV 93.7 78.0 - 100.0 fL   MCH 31.5 26.0 - 34.0 pg   MCHC 33.7 30.0 - 36.0 g/dL   RDW 12.0 11.5 - 15.5 %   Platelets 309 150 - 400 K/uL   Neutrophils Relative % 54 43 - 77 %   Neutro Abs 5.0 1.7 - 7.7 K/uL   Lymphocytes Relative 35 12 - 46 %   Lymphs Abs 3.2 0.7 - 4.0 K/uL   Monocytes Relative 10 3 - 12 %   Monocytes Absolute 1.0 0.1 - 1.0 K/uL   Eosinophils Relative 1 0 - 5 %   Eosinophils Absolute 0.1 0.0 - 0.7 K/uL   Basophils Relative 0 0 - 1 %   Basophils Absolute 0.0 0.0 - 0.1 K/uL  Comprehensive metabolic panel  Result Value Ref Range   Sodium 138 135 - 145 mmol/L   Potassium 4.0 3.5 - 5.1 mmol/L   Chloride 103 96 - 112 mmol/L   CO2 27 19 - 32 mmol/L   Glucose, Bld 104 (H) 70 - 99 mg/dL   BUN 7 6 - 23 mg/dL   Creatinine, Ser 0.71 0.50 - 1.10 mg/dL   Calcium 9.3 8.4 - 10.5 mg/dL   Total Protein 7.7 6.0 - 8.3 g/dL   Albumin 4.2 3.5 - 5.2 g/dL   AST 24 0 - 37 U/L   ALT 21 0 - 35 U/L   Alkaline Phosphatase 89 39 - 117 U/L   Total Bilirubin 0.5 0.3 - 1.2 mg/dL   GFR calc non Af Amer 89 (L) >90 mL/min   GFR calc Af Amer >90 >90 mL/min   Anion gap 8 5 - 15  Urinalysis, Routine w reflex microscopic  Result Value Ref Range   Color, Urine YELLOW YELLOW   APPearance CLEAR CLEAR   Specific Gravity, Urine 1.007 1.005 - 1.030   pH 7.5 5.0 - 8.0   Glucose, UA NEGATIVE NEGATIVE mg/dL   Hgb urine dipstick NEGATIVE NEGATIVE   Bilirubin Urine NEGATIVE NEGATIVE   Ketones, ur NEGATIVE NEGATIVE mg/dL   Protein, ur NEGATIVE  NEGATIVE mg/dL   Urobilinogen, UA 0.2 0.0 - 1.0 mg/dL   Nitrite NEGATIVE NEGATIVE   Leukocytes, UA MODERATE (A) NEGATIVE  Urine microscopic-add on  Result Value Ref Range   Squamous Epithelial / LPF RARE RARE   WBC, UA 7-10 <3 WBC/hpf   Bacteria, UA RARE RARE    Labs Review Labs Reviewed  COMPREHENSIVE METABOLIC PANEL - Abnormal; Notable for the following:    Glucose, Bld 104 (*)    GFR calc non Af Amer 89 (*)    All other components within normal limits  URINALYSIS, ROUTINE W REFLEX MICROSCOPIC - Abnormal; Notable for the following:    Leukocytes, UA MODERATE (*)    All other components within normal limits  URINE CULTURE  CBC WITH DIFFERENTIAL/PLATELET  URINE MICROSCOPIC-ADD ON    Imaging Review No results found.   EKG Interpretation None      7:38 PM This provider discussed case in great detail with attending  physician, Dr. Roxine Caddy. Discussed history and physical examination. As per attending physician, that since patient was bearing down, patient could have had a rectal prolapse. Since no prolapse noted at this time - recommended patient to be discharged home and to follow up with PCP and GI as an outpatient.   MDM   Final diagnoses:  UTI (lower urinary tract infection)  Rectal pressure    Medications - No data to display  Filed Vitals:   12/05/14 1729 12/05/14 1933 12/05/14 2114  BP: 147/72 142/65 136/65  Pulse: 73 66 63  Temp: 97.9 F (36.6 C)    TempSrc: Oral    Resp: 18 20 18   Height: 5\' 6"  (1.676 m)    Weight: 165 lb (74.844 kg)    SpO2: 97% 99% 99%   This provider reviewed the patient's chart. Patient is followed by GI at [facility], Dr. Dolores Patty - has been followed by GI since 2013 with issues with abdominal pain that has been chronic. Patient last seen on 03/21/2014. Upper EGD and sigmoidoscopy performed with negative findings. Colonscopy performed in 2011 with negative findings. Patient has CT abdomen and pelvis performed on 05/25/2014  that identified a hepatic cyst that has been stable, left adrenal adenoma with sigmoid diverticulosis noted.  CBC negative elevated leukocytosis. Hemoglobin 14.1, hematocrit 41.9. CMP unremarkable-glucose of 104, negative elevated anion gap. Urinalysis noted moderate leukocytes with white blood cell count 7-10. Urine culture pending. Patient presenting to the ED with a bulge in the perineum that she identified earlier today and a pressure sensation in her rectum. Negative prolapse of the uterus identified on physical exam. Negative bulge in the perineum identified. Rectal exam performed that identified soft stool in the rectum with negative prolapse or hemorrhoids. Negative findings of abscess, inflammation areas of induration or fluctuance-negative pus drainage noted at this time. Labs unremarkable. Mild UTI findings with moderate leukocytes with white blood cell count 7-10-urine cultures ordered and pending. Will treat patient secondary to patient having a pressure sensation with increased urination. Discussed case in great detail with attending physician who agrees to plan of discharge with follow-up with GI. Patient stable, afebrile. Patient not septic appearing. Discharged patient. Discharge patient with me relaxers and Keflex. Discussed with patient to rest and stay hydrated. Discussed with patient that if she does not have a bowel movement or does not pass gas within approximately 24 hours report back to the ED immediately. Referred patient to PCP and GI. Discussed with patient to increase fluids to soften the stools. Discussed with patient to closely monitor symptoms and if symptoms are to worsen or change to report back to the ED - strict return instructions given.  Patient agreed to plan of care, understood, all questions answered.   Jamse Mead, PA-C 12/05/14 2116  Jamse Mead, PA-C 12/05/14 2117  Jamse Mead, PA-C 12/05/14 2123  Malvin Johns, MD 12/06/14 [phone]

## 2014-12-05 NOTE — Discharge Instructions (Signed)
Please call your doctor for a followup appointment within 24-48 hours. When you talk to your doctor please let them know that you were seen in the emergency department and have them acquire all of your records so that they can discuss the findings with you and formulate a treatment plan to fully care for your new and ongoing problems. Please follow-up with her primary care provider Highly recommend following up with gastroenterologist regarding episode Please rest and stay hydrated Please take antibiotics as prescribed Please increase fluid intake in order to soften the stools to prevent straining Please continue to monitor symptoms closely and if symptoms are to worsen or change (fever greater than 101, chills, sweating, nausea, vomiting, chest pain, shortness of breathe, difficulty breathing, weakness, numbness, tingling, worsening or changes to pain pattern, inability keep food or fluids down, blood in the stools, black tarry stools, prolapse of rectum or uterus, increased bulging, abdominal distention passage of gas) please report back to the Emergency Department immediately.   Urinary Tract Infection Urinary tract infections (UTIs) can develop anywhere along your urinary tract. Your urinary tract is your body's drainage system for removing wastes and extra water. Your urinary tract includes two kidneys, two ureters, a bladder, and a urethra. Your kidneys are a pair of bean-shaped organs. Each kidney is about the size of your fist. They are located below your ribs, one on each side of your spine. CAUSES Infections are caused by microbes, which are microscopic organisms, including fungi, viruses, and bacteria. These organisms are so small that they can only be seen through a microscope. Bacteria are the microbes that most commonly cause UTIs. SYMPTOMS  Symptoms of UTIs may vary by age and gender of the patient and by the location of the infection. Symptoms in young women typically include a frequent  and intense urge to urinate and a painful, burning feeling in the bladder or urethra during urination. Older women and men are more likely to be tired, shaky, and weak and have muscle aches and abdominal pain. A fever may mean the infection is in your kidneys. Other symptoms of a kidney infection include pain in your back or sides below the ribs, nausea, and vomiting. DIAGNOSIS To diagnose a UTI, your caregiver will ask you about your symptoms. Your caregiver also will ask to provide a urine sample. The urine sample will be tested for bacteria and white blood cells. White blood cells are made by your body to help fight infection. TREATMENT  Typically, UTIs can be treated with medication. Because most UTIs are caused by a bacterial infection, they usually can be treated with the use of antibiotics. The choice of antibiotic and length of treatment depend on your symptoms and the type of bacteria causing your infection. HOME CARE INSTRUCTIONS  If you were prescribed antibiotics, take them exactly as your caregiver instructs you. Finish the medication even if you feel better after you have only taken some of the medication.  Drink enough water and fluids to keep your urine clear or pale yellow.  Avoid caffeine, tea, and carbonated beverages. They tend to irritate your bladder.  Empty your bladder often. Avoid holding urine for long periods of time.  Empty your bladder before and after sexual intercourse.  After a bowel movement, women should cleanse from front to back. Use each tissue only once. SEEK MEDICAL CARE IF:   You have back pain.  You develop a fever.  Your symptoms do not begin to resolve within 3 days. [place]  CARE IF:   You have severe back pain or lower abdominal pain.  You develop chills.  You have nausea or vomiting.  You have continued burning or discomfort with urination. MAKE SURE YOU:   Understand these instructions.  Will watch your  condition.  Will get help right away if you are not doing well or get worse. Document Released: 05/14/2005 Document Revised: 02/03/2012 Document Reviewed: 09/12/2011 [facility] Patient Information 2015 [place], [place]. This information is not intended to replace advice given to you by your health care provider. Make sure you discuss any questions you have with your health care provider.

## 2014-12-07 ENCOUNTER — Other Ambulatory Visit: Payer: Self-pay | Admitting: Family Medicine

## 2014-12-07 ENCOUNTER — Telehealth: Payer: Self-pay | Admitting: Family Medicine

## 2014-12-07 DIAGNOSIS — Z124 Encounter for screening for malignant neoplasm of cervix: Secondary | ICD-10-CM

## 2014-12-07 LAB — URINE CULTURE
Colony Count: 25000
Special Requests: NORMAL

## 2014-12-07 NOTE — Telephone Encounter (Signed)
Referral placed.

## 2014-12-07 NOTE — Telephone Encounter (Signed)
Patient informed referral done.

## 2014-12-07 NOTE — Telephone Encounter (Signed)
Caller name: Lauralei Relation to pt: self Call back number: [phone] Pharmacy:  Reason for call:   Now has [facility] and is needing referral to continue seeing her obgyn--Dr. Donalynn Furlong at [facility].

## 2014-12-12 ENCOUNTER — Ambulatory Visit (INDEPENDENT_AMBULATORY_CARE_PROVIDER_SITE_OTHER): Payer: 59 | Admitting: Family Medicine

## 2014-12-12 ENCOUNTER — Encounter: Payer: Self-pay | Admitting: Family Medicine

## 2014-12-12 ENCOUNTER — Encounter: Payer: Self-pay | Admitting: Gynecology

## 2014-12-12 ENCOUNTER — Ambulatory Visit (INDEPENDENT_AMBULATORY_CARE_PROVIDER_SITE_OTHER): Payer: 59 | Admitting: Gynecology

## 2014-12-12 VITALS — BP 124/78 | HR 65 | Temp 98.0°F | Resp 18 | Ht 66.0 in | Wt 162.0 lb

## 2014-12-12 VITALS — BP 124/76

## 2014-12-12 DIAGNOSIS — R1084 Generalized abdominal pain: Secondary | ICD-10-CM

## 2014-12-12 DIAGNOSIS — I10 Essential (primary) hypertension: Secondary | ICD-10-CM

## 2014-12-12 DIAGNOSIS — R509 Fever, unspecified: Secondary | ICD-10-CM

## 2014-12-12 DIAGNOSIS — N8111 Cystocele, midline: Secondary | ICD-10-CM

## 2014-12-12 DIAGNOSIS — K589 Irritable bowel syndrome without diarrhea: Secondary | ICD-10-CM

## 2014-12-12 DIAGNOSIS — R194 Change in bowel habit: Secondary | ICD-10-CM

## 2014-12-12 DIAGNOSIS — R739 Hyperglycemia, unspecified: Secondary | ICD-10-CM

## 2014-12-12 DIAGNOSIS — F341 Dysthymic disorder: Secondary | ICD-10-CM

## 2014-12-12 DIAGNOSIS — R319 Hematuria, unspecified: Secondary | ICD-10-CM

## 2014-12-12 DIAGNOSIS — K219 Gastro-esophageal reflux disease without esophagitis: Secondary | ICD-10-CM

## 2014-12-12 DIAGNOSIS — R109 Unspecified abdominal pain: Secondary | ICD-10-CM

## 2014-12-12 DIAGNOSIS — E782 Mixed hyperlipidemia: Secondary | ICD-10-CM

## 2014-12-12 DIAGNOSIS — Z0000 Encounter for general adult medical examination without abnormal findings: Secondary | ICD-10-CM

## 2014-12-12 LAB — CBC
HEMATOCRIT: 41 % (ref 36.0–46.0)
HEMOGLOBIN: 14 g/dL (ref 12.0–15.0)
MCHC: 34 g/dL (ref 30.0–36.0)
MCV: 92.2 fl (ref 78.0–100.0)
PLATELETS: 306 10*3/uL (ref 150.0–400.0)
RBC: 4.45 Mil/uL (ref 3.87–5.11)
RDW: 12.6 % (ref 11.5–15.5)
WBC: 6.8 10*3/uL (ref 4.0–10.5)

## 2014-12-12 LAB — URINALYSIS, ROUTINE W REFLEX MICROSCOPIC
BILIRUBIN URINE: NEGATIVE
HGB URINE DIPSTICK: NEGATIVE
Ketones, ur: NEGATIVE
Nitrite: NEGATIVE
RBC / HPF: NONE SEEN (ref 0–?)
SPECIFIC GRAVITY, URINE: 1.015 (ref 1.000–1.030)
Total Protein, Urine: NEGATIVE
Urine Glucose: NEGATIVE
Urobilinogen, UA: 0.2 (ref 0.0–1.0)
pH: 6 (ref 5.0–8.0)

## 2014-12-12 LAB — COMPREHENSIVE METABOLIC PANEL
ALBUMIN: 4.4 g/dL (ref 3.5–5.2)
ALT: 20 U/L (ref 0–35)
AST: 19 U/L (ref 0–37)
Alkaline Phosphatase: 92 U/L (ref 39–117)
BILIRUBIN TOTAL: 0.4 mg/dL (ref 0.2–1.2)
BUN: 9 mg/dL (ref 6–23)
CO2: 24 mEq/L (ref 19–32)
CREATININE: 0.69 mg/dL (ref 0.40–1.20)
Calcium: 9.7 mg/dL (ref 8.4–10.5)
Chloride: 104 mEq/L (ref 96–112)
GFR: 90.92 mL/min (ref >60.00)
GLUCOSE: 103 mg/dL — AB (ref 70–99)
Potassium: 4.6 mEq/L (ref 3.5–5.1)
Sodium: 140 mEq/L (ref 135–145)
Total Protein: 7.3 g/dL (ref 6.0–8.3)

## 2014-12-12 LAB — LIPID PANEL
Cholesterol: 180 mg/dL (ref 0–200)
HDL: 47 mg/dL (ref >39.00)
LDL Cholesterol: 107 mg/dL — ABNORMAL HIGH (ref 0–99)
NonHDL: 133
Total CHOL/HDL Ratio: 4
Triglycerides: 129 mg/dL (ref 0.0–149.0)
VLDL: 25.8 mg/dL (ref 0.0–40.0)

## 2014-12-12 LAB — TSH: TSH: 1.29 u[IU]/mL (ref 0.35–4.50)

## 2014-12-12 LAB — HEMOGLOBIN A1C: HEMOGLOBIN A1C: 5.6 % (ref 4.6–6.5)

## 2014-12-12 NOTE — Progress Notes (Signed)
Megan [name] 10-12-1950 [id number]        63 y.o.  [id number] Presents with a three-month history of abdominal pain both diffuse as well as localizing in the right lower quadrant. In review of her records so she's been having issues with abdominal pain for years.  I have seen her in the past noting a normal GYN ultrasound 11/2012.  More recently she has a CT scan October 2015 which showed some diverticulosis and a stable adrenal tumor. Uterus and adnexa were negative. No evidence of ascites or adenopathy. She notes now a lot of diffuse discomfort bloating some right lower quadrant nagging pain nausea and constipation with intermittent diarrhea. She also has a history of some pelvic relaxation with cystocele and uterine prolapse that has been stable historically. She does note some vaginal pressure at times. Also notes urinary frequency and some SUI symptoms.  Past medical history,surgical history, problem list, medications, allergies, family history and social history were all reviewed and documented in the EPIC chart.  Directed ROS with pertinent positives and negatives documented in the history of present illness/assessment and plan.  Exam: Kim assistant Filed Vitals:   12/12/14 1043  BP: 124/76   General appearance:  Normal Abdomen diffusely distended with normal bowel sounds. No masses guarding rebound organomegaly. Pelvic external BUS vagina with first to second-degree cystocele. Mild uterine prolapse. Mild rectocele. Cervix normal. Uterus normal size midline mobile. Adnexa without gross masses. Rectal exam is normal.  Assessment/Plan:  64 y.o. [id number] with a long history of abdominal pain now more acute over the last several months. Bloated with nausea diarrhea/constipation. She is in the process of setting up an appointment with her gastroenterologist which I think is the etiology of her pain. She does have pelvic relaxation but I do not think this is the source of her abdominal pain.  Options for her pelvic relaxation were reviewed to include hysterectomy and anterior/posterior colporrhaphy and possible sling. Patient states that she is not interested in a sling having read a lot about it.  She is overdue for her annual exam and I asked her to schedule that in 1-2 months to allow for GI evaluation first. I'm not interested in pursuing surgery at this time until her GI issues are either resolved or under control. I reviewed with her she has a lot of bloating nausea/vomiting and constipation that this could lead to postoperative issues and breakdown of her repair. Patient's comfortable in waiting for this. I did recommend a baseline ultrasound now at her request just to make sure there is not an ovarian process and she is scheduling a follow up with her gastroenterologist. She also was seen today by her primary physician who drew some blood work and she'll follow up with her in reference to this.    Anastasio Auerbach MD, 10:59 AM 12/12/2014

## 2014-12-12 NOTE — Patient Instructions (Signed)
Will await urine culture to treat unless you develop symptoms such as burning Probiotics daily,    Urinary Tract Infection Urinary tract infections (UTIs) can develop anywhere along your urinary tract. Your urinary tract is your body's drainage system for removing wastes and extra water. Your urinary tract includes two kidneys, two ureters, a bladder, and a urethra. Your kidneys are a pair of bean-shaped organs. Each kidney is about the size of your fist. They are located below your ribs, one on each side of your spine. CAUSES Infections are caused by microbes, which are microscopic organisms, including fungi, viruses, and bacteria. These organisms are so small that they can only be seen through a microscope. Bacteria are the microbes that most commonly cause UTIs. SYMPTOMS  Symptoms of UTIs may vary by age and gender of the patient and by the location of the infection. Symptoms in young women typically include a frequent and intense urge to urinate and a painful, burning feeling in the bladder or urethra during urination. Older women and men are more likely to be tired, shaky, and weak and have muscle aches and abdominal pain. A fever may mean the infection is in your kidneys. Other symptoms of a kidney infection include pain in your back or sides below the ribs, nausea, and vomiting. DIAGNOSIS To diagnose a UTI, your caregiver will ask you about your symptoms. Your caregiver also will ask to provide a urine sample. The urine sample will be tested for bacteria and white blood cells. White blood cells are made by your body to help fight infection. TREATMENT  Typically, UTIs can be treated with medication. Because most UTIs are caused by a bacterial infection, they usually can be treated with the use of antibiotics. The choice of antibiotic and length of treatment depend on your symptoms and the type of bacteria causing your infection. HOME CARE INSTRUCTIONS  If you were prescribed antibiotics, take  them exactly as your caregiver instructs you. Finish the medication even if you feel better after you have only taken some of the medication.  Drink enough water and fluids to keep your urine clear or pale yellow.  Avoid caffeine, tea, and carbonated beverages. They tend to irritate your bladder.  Empty your bladder often. Avoid holding urine for long periods of time.  Empty your bladder before and after sexual intercourse.  After a bowel movement, women should cleanse from front to back. Use each tissue only once. SEEK MEDICAL CARE IF:   You have back pain.  You develop a fever.  Your symptoms do not begin to resolve within 3 days. SEEK IMMEDIATE MEDICAL CARE IF:   You have severe back pain or lower abdominal pain.  You develop chills.  You have nausea or vomiting.  You have continued burning or discomfort with urination. MAKE SURE YOU:   Understand these instructions.  Will watch your condition.  Will get help right away if you are not doing well or get worse. Document Released: 05/14/2005 Document Revised: 02/03/2012 Document Reviewed: 09/12/2011 [facility] Patient Information 2015 [place], [place]. This information is not intended to replace advice given to you by your health care provider. Make sure you discuss any questions you have with your health care provider.

## 2014-12-12 NOTE — Assessment & Plan Note (Signed)
Well controlled, no changes to meds. Encouraged heart healthy diet such as the DASH diet and exercise as tolerated. Improved with follow up

## 2014-12-12 NOTE — Progress Notes (Signed)
Megan [name]  [id number] [birth year] 12/12/2014      Progress Note-Follow Up  Subjective  Chief Complaint  Chief Complaint  Patient presents with  . Follow-up    States she's been in the ER twice since last visit for diarrhea followed by golf ball sized feces that were really hard to pass    HPI  Patient is a 64 y.o. female in today for routine medical care. Patient is in today for evaluation of numerous concerns once again. She is reporting recurrent abdominal pain. Has had diffuse pain most notably, most recently she's noted right lower quadrant pain and pelvic floor discomfort. She's had a change in her bowel habits and is now alternating frequently between diarrhea and constipation. She describes rectal pressure and sometimes a sense of something protruding from the rectum. Denies vaginal discharge but does have an appointment this week with GYN. She has complained of an occasional fever over the last month but none presently. Feels chilled frequently. Complains of back pain and myalgias but this is ongoing. Acknowledges ongoing anxiety, depression and anhedonia but denies suicidal ideation. Denies CP/palp/SOB/HA/congestion/fevers/GI or GU c/o. Taking meds as prescribed  Past Medical History  Diagnosis Date  . GERD (gastroesophageal reflux disease)   . Depression   . Anxiety   . Insomnia   . IBS (irritable bowel syndrome)   . Diverticulosis   . Barrett esophagus   . Hiatal hernia   . Unspecified menopausal and postmenopausal disorder   . Vitamin B12 deficiency   . Blind loop syndrome   . Hepatic cyst   . Unspecified hypothyroidism   . Diarrhea   . Family history of malignant neoplasm of gastrointestinal tract     multiple members  . Endometrial hyperplasia 02/27/2006    BENIGN ENDO BX ON 02/2007  . C. difficile diarrhea   . Cystocele, midline   . Rectocele   . Uterine prolapse without mention of vaginal wall prolapse   . Chest pain, atypical 07/14/2011  . Leaky  heart valve   . Hiatal hernia   . 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 Thrush 07/21/2011  . Rectal bleeding 10/10/2011  . Sinusitis acute 02/03/2012  . Contact dermatitis 03/23/2012  . Headache(784.0) 04/15/2012  . 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 Hematuria 04/27/2012  . Allergic state 12/31/2012  . 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 Paronychia of great toe, left 06/19/2013  . Low back pain 08/15/2013  . Osteoporosis 05/2011    -2.5 Lt femoral neck  . 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 Hypertension   . Asthma   . 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 Preventative health care 09/11/2014    Past Surgical History  Procedure Laterality Date  . Appendectomy    . Uterine fibroid surgery    . Hysteroscopy      POLYP    Family History  Problem Relation Age of Onset  . Colon cancer Mother   . Diabetes Mother   . Hypertension Mother   . Cancer Mother     colon  . Colon polyps Father   . Parkinson's disease Father   . Colon cancer Maternal Aunt   . Colon cancer Maternal Grandmother   . Breast cancer Maternal Grandmother   . Cancer Maternal Grandmother     colon  . Diabetes Paternal Grandmother   . Breast cancer Paternal Grandmother   . Cancer Paternal Grandmother     breast  . Colon cancer Cousin     X3  . Ovarian cancer Cousin   .  Allergies Sister   . Arthritis Brother     b/l hip replace  . Alcohol abuse Daughter   . Arthritis Son   . Hypertension Son   . Allergies Son   . Osteoporosis Son   . Osteoporosis Son   . Arthritis Son     back disease, DDD  . Allergies Son   . Arthritis Son   . Other Son     RSD, IBS  . Hypertension Paternal Grandfather   . Heart disease Paternal Grandfather     aneurysm    History   Social History  . Marital Status: Married    Spouse Name: N/A  . Number of Children: 4  . Years of Education: N/A   Occupational History  . Retired    Social History Main Topics  . Smoking status: Former Smoker    Quit date: 08/19/1995  . Smokeless tobacco:  Never Used  . Alcohol Use: No  . Drug Use: No  . Sexual Activity: Not on file   Other Topics Concern  . Not on file   Social History Narrative    Current Outpatient Prescriptions on File Prior to Visit  Medication Sig Dispense Refill  . albuterol (VENTOLIN HFA) 108 (90 BASE) MCG/ACT inhaler INHALE 2 PUFFS BY MOUTH EVERY 6 HOURS AS NEEDED 18 each 1  . amitriptyline (ELAVIL) 50 MG tablet TAKE 1 TABLET BY MOUTH ATNIGHTLY AT BEDTIME 30 tablet 3  . antipyrine-benzocaine (AURALGAN) otic solution Place 3-4 drops into the right ear every 2 (two) hours as needed for ear pain. 10 mL 0  . CARTIA XT 180 MG 24 hr capsule TAKE 1 CAPSULE BY MOUTH ONCE A DAY 90 capsule 1  . carvedilol (COREG) 12.5 MG tablet Take 1 tablet (12.5 mg total) by mouth daily. 90 tablet 1  . cyclobenzaprine (FLEXERIL) 10 MG tablet Take 1 tablet (10 mg total) by mouth 3 (three) times daily as needed for muscle spasms. 40 tablet 1  . diazepam (VALIUM) 10 MG tablet Take 1 tablet (10 mg total) by mouth every 12 (twelve) hours as needed for anxiety. 60 tablet 2  . dicyclomine (BENTYL) 10 MG capsule Take 1 capsule (10 mg total) by mouth 3 (three) times daily before meals. 30 capsule 0  . diltiazem (CARDIZEM CD) 180 MG 24 hr capsule Take 1 capsule (180 mg total) by mouth daily. 90 capsule 3  . fluticasone (FLONASE) 50 MCG/ACT nasal spray USE 2 SPRAYS IN EACH NOSTRIL EVERY DAY 16 g 2  . hydrochlorothiazide (HYDRODIURIL) 25 MG tablet Take 1 tablet (25 mg total) by mouth daily. 90 tablet 1  . HYDROcodone-acetaminophen (NORCO) 10-325 MG per tablet Take 1 tablet by mouth every 8 (eight) hours as needed. 65 tablet 0  . hyoscyamine (LEVBID) 0.375 MG 12 hr tablet TAKE 2 TABLETS BY MOUTH TWICE DAILY AS NEEDED 360 tablet 2  . lactobacillus acidophilus (BACID) TABS tablet Take 2 tablets by mouth 3 (three) times daily.    Marland Kitchen losartan (COZAAR) 25 MG tablet Take 1 tablet (25 mg total) by mouth daily. 30 tablet 3  . omeprazole (PRILOSEC) 40 MG capsule  Take 1 capsule by mouth 2 times daily before meals.  Take 30 min before breakfast and dinner.    . ondansetron (ZOFRAN-ODT) 4 MG disintegrating tablet Take 1 tablet (4 mg total) by mouth every 8 (eight) hours as needed for nausea or vomiting. 20 tablet 0  . polyethylene glycol (MIRALAX / GLYCOLAX) packet Take 17 g by mouth daily. 14 each  0  . promethazine (PHENERGAN) 25 MG tablet TAKE 1 TABLET BY MOUTH THREE TIMES DAILY AS NEEDED FOR NAUSEA 30 tablet 0  . ranitidine (ZANTAC) 300 MG tablet Take 1 tablet (300 mg total) by mouth at bedtime. 90 tablet 1  . zolpidem (AMBIEN) 10 MG tablet Take 1 tablet (10 mg total) by mouth at bedtime as needed for sleep. 15 tablet 2   No current facility-administered medications on file prior to visit.    Allergies  Allergen Reactions  . Sulfamethoxazole Shortness Of Breath    chest tightness  . Iohexol   . Iodinated Diagnostic Agents     Pt states at Dr.Grapeys office 15 years ago blacked out for 2 hours during injection for IVP. Was told never to have IV dye again.     Review of Systems  Review of Systems  Constitutional: Positive for fever, chills and malaise/fatigue.  HENT: Negative for congestion, hearing loss and nosebleeds.   Eyes: Negative for discharge.  Respiratory: Negative for cough, sputum production, shortness of breath and wheezing.   Cardiovascular: Negative for chest pain, palpitations and leg swelling.  Gastrointestinal: Positive for heartburn, nausea, abdominal pain, diarrhea and constipation. Negative for vomiting and blood in stool.  Genitourinary: Positive for urgency, frequency and flank pain. Negative for dysuria and hematuria.  Musculoskeletal: Positive for back pain. Negative for myalgias and falls.  Skin: Negative for rash.  Neurological: Negative for dizziness, tremors, sensory change, focal weakness, loss of consciousness, weakness and headaches.  Endo/Heme/Allergies: Negative for polydipsia. Does not bruise/bleed easily.    Psychiatric/Behavioral: Positive for depression. Negative for suicidal ideas. The patient is nervous/anxious. The patient does not have insomnia.     Objective  BP 124/78 mmHg  Pulse 65  Temp(Src) 98 F (36.7 C) (Oral)  Resp 18  Ht 5\' 6"  (1.676 m)  Wt 162 lb (73.483 kg)  BMI 26.16 kg/m2  SpO2 98%  Physical Exam  Physical Exam  Constitutional: She is oriented to person, place, and time and well-developed, well-nourished, and in no distress. No distress.  HENT:  Head: Normocephalic and atraumatic.  Right Ear: External ear normal.  Left Ear: External ear normal.  Nose: Nose normal.  Mouth/Throat: Oropharynx is clear and moist. No oropharyngeal exudate.  Eyes: Conjunctivae are normal. Pupils are equal, round, and reactive to light. Right eye exhibits no discharge. Left eye exhibits no discharge. No scleral icterus.  Neck: Normal range of motion. Neck supple. No thyromegaly present.  Cardiovascular: Normal rate, regular rhythm, normal heart sounds and intact distal pulses.   No murmur heard. Pulmonary/Chest: Effort normal and breath sounds normal. No respiratory distress. She has no wheezes. She has no rales.  Abdominal: Soft. Bowel sounds are normal. She exhibits no distension and no mass. There is tenderness.  Musculoskeletal: Normal range of motion. She exhibits no edema or tenderness.  Lymphadenopathy:    She has no cervical adenopathy.  Neurological: She is alert and oriented to person, place, and time. She has normal reflexes. No cranial nerve deficit. Coordination normal.  Skin: Skin is warm and dry. No rash noted. She is not diaphoretic.  Psychiatric: Mood, memory and affect normal.    Lab Results  Component Value Date   TSH 0.82 09/11/2014   Lab Results  Component Value Date   WBC 9.3 12/05/2014   HGB 14.1 12/05/2014   HCT 41.9 12/05/2014   MCV 93.7 12/05/2014   PLT 309 12/05/2014   Lab Results  Component Value Date   CREATININE 0.71 12/05/2014  BUN 7  12/05/2014   NA 138 12/05/2014   K 4.0 12/05/2014   CL 103 12/05/2014   CO2 27 12/05/2014   Lab Results  Component Value Date   ALT 21 12/05/2014   AST 24 12/05/2014   ALKPHOS 89 12/05/2014   BILITOT 0.5 12/05/2014   Lab Results  Component Value Date   CHOL 247* 09/11/2014   Lab Results  Component Value Date   HDL 50.80 09/11/2014   Lab Results  Component Value Date   LDLCALC 167* 09/11/2014   Lab Results  Component Value Date   TRIG 146.0 09/11/2014   Lab Results  Component Value Date   CHOLHDL 5 09/11/2014     Assessment & Plan  Essential hypertension Well controlled, no changes to meds. Encouraged heart healthy diet such as the DASH diet and exercise as tolerated. Improved with follow up   IBS (irritable bowel syndrome) Worsening change in bowel habits with increased diarrhea, rectal pressure and recent episode of painful constipation. Referred back to gastroenterology at [facility] for further evaluation   Hyperglycemia  minimize simple carbs. Increase exercise as tolerated. Continue current meds   Abdominal pain Has appt with GYN this afternoon, has previously discussed hysterectomy in past. Symptoms suggestive of prolapse. Will await gyn input   Hematuria Recent uti treated and patient now not symptomatic, repeat UA with c&s

## 2014-12-12 NOTE — Progress Notes (Signed)
Pre visit review using our clinic review tool, if applicable. No additional management support is needed unless otherwise documented below in the visit note. 

## 2014-12-12 NOTE — Assessment & Plan Note (Signed)
Worsening change in bowel habits with increased diarrhea, rectal pressure and recent episode of painful constipation. Referred back to gastroenterology at [facility] for further evaluation

## 2014-12-12 NOTE — Assessment & Plan Note (Signed)
Has appt with GYN this afternoon, has previously discussed hysterectomy in past. Symptoms suggestive of prolapse. Will await gyn input

## 2014-12-12 NOTE — Assessment & Plan Note (Signed)
Recent uti treated and patient now not symptomatic, repeat UA with c&s

## 2014-12-12 NOTE — Assessment & Plan Note (Signed)
minimize simple carbs. Increase exercise as tolerated. Continue current meds  

## 2014-12-12 NOTE — Patient Instructions (Signed)
Follow up for ultrasound as scheduled.  Follow up with her gastroenterologist.  Follow up for annual exam in 1-2 months.

## 2014-12-13 LAB — HIV ANTIBODY (ROUTINE TESTING W REFLEX): HIV: NONREACTIVE

## 2014-12-14 ENCOUNTER — Encounter: Payer: Self-pay | Admitting: Family Medicine

## 2014-12-14 LAB — URINE CULTURE
COLONY COUNT: NO GROWTH
ORGANISM ID, BACTERIA: NO GROWTH

## 2014-12-15 ENCOUNTER — Telehealth: Payer: Self-pay | Admitting: Family Medicine

## 2014-12-15 ENCOUNTER — Other Ambulatory Visit: Payer: Self-pay | Admitting: Family Medicine

## 2014-12-15 DIAGNOSIS — R194 Change in bowel habit: Secondary | ICD-10-CM

## 2014-12-15 DIAGNOSIS — E785 Hyperlipidemia, unspecified: Secondary | ICD-10-CM

## 2014-12-15 NOTE — Telephone Encounter (Signed)
Patient does not want to repeat cmp, ua and culture. She will call back for an appointment if pain continues.

## 2014-12-15 NOTE — Telephone Encounter (Signed)
Lab entered

## 2014-12-15 NOTE — Telephone Encounter (Signed)
Caller name: [place], Meraz Relation to pt: self  Call back number: [phone]   Reason for call:  As per via my chart message pt is returning your call pt states she is exp. pain in right side. Pt has a gastro appointment next week and was last seen 12/12/14 with Dr. Charlett Blake

## 2014-12-17 ENCOUNTER — Encounter: Payer: Self-pay | Admitting: Family Medicine

## 2014-12-17 DIAGNOSIS — E782 Mixed hyperlipidemia: Secondary | ICD-10-CM

## 2014-12-17 DIAGNOSIS — R194 Change in bowel habit: Secondary | ICD-10-CM | POA: Insufficient documentation

## 2014-12-17 DIAGNOSIS — E785 Hyperlipidemia, unspecified: Secondary | ICD-10-CM | POA: Insufficient documentation

## 2014-12-17 HISTORY — DX: Mixed hyperlipidemia: E78.2

## 2014-12-17 NOTE — Assessment & Plan Note (Addendum)
Very tearful due to discomfort and ongoing life stressors, has not tolerated meds well historically, encouraged to consider counseling.

## 2014-12-17 NOTE — Assessment & Plan Note (Signed)
Avoid offending foods, start probiotics. Do not eat large meals in late evening and consider raising head of bed.  

## 2014-12-17 NOTE — Assessment & Plan Note (Signed)
With pelvic floor pressure, no obvious cause on testing so far, has appt this week with gyn and is encouraged to make an appt with her Gastroenterology group at [facility]. Avoid offending foods, use probiotics. Do not eat large meals in late evening and consider raising head of bed.

## 2014-12-17 NOTE — Assessment & Plan Note (Signed)
Encouraged heart healthy diet, increase exercise, avoid trans fats, consider a krill oil cap daily 

## 2014-12-18 ENCOUNTER — Ambulatory Visit (INDEPENDENT_AMBULATORY_CARE_PROVIDER_SITE_OTHER): Payer: 59

## 2014-12-18 ENCOUNTER — Encounter: Payer: Self-pay | Admitting: Gynecology

## 2014-12-18 ENCOUNTER — Other Ambulatory Visit: Payer: Self-pay | Admitting: Gynecology

## 2014-12-18 ENCOUNTER — Ambulatory Visit (INDEPENDENT_AMBULATORY_CARE_PROVIDER_SITE_OTHER): Payer: 59 | Admitting: Gynecology

## 2014-12-18 VITALS — BP 120/74

## 2014-12-18 DIAGNOSIS — R1084 Generalized abdominal pain: Secondary | ICD-10-CM

## 2014-12-18 DIAGNOSIS — N83319 Acquired atrophy of ovary, unspecified side: Secondary | ICD-10-CM

## 2014-12-18 DIAGNOSIS — N8331 Acquired atrophy of ovary: Secondary | ICD-10-CM | POA: Diagnosis not present

## 2014-12-18 NOTE — Progress Notes (Signed)
Megan [name] 1950/11/16 [id number]        63 y.o.  [id number] Presents for ultrasound due to her history of abdominal pain. Has appointment scheduled with gastroenterologist tomorrow.  Past medical history,surgical history, problem list, medications, allergies, family history and social history were all reviewed and documented in the EPIC chart.  Directed ROS with pertinent positives and negatives documented in the history of present illness/assessment and plan.  Exam: Filed Vitals:   12/18/14 0916  BP: 120/74   General appearance:  Normal  Ultrasound shows uterus normal size and echotexture. Endometrial echo 1.7 mm. Right and left ovaries visualized and atrophic. Cul-de-sac negative.  Assessment/Plan:  64 y.o. [id number] with normal GYN ultrasound. History of abdominal pain consistent with GI etiology. Patient will follow up with her gastroenterologist tomorrow as scheduled.    Anastasio Auerbach MD, 9:34 AM 12/18/2014

## 2014-12-18 NOTE — Patient Instructions (Signed)
Follow up with your gastroenterologist as arranged.

## 2014-12-19 ENCOUNTER — Telehealth: Payer: Self-pay | Admitting: Family Medicine

## 2014-12-19 DIAGNOSIS — M25579 Pain in unspecified ankle and joints of unspecified foot: Secondary | ICD-10-CM

## 2014-12-19 NOTE — Telephone Encounter (Signed)
Last office visit 12/12/14 Last refill 11/22/14  #65 with 0 refills Next office visit 01/26/15

## 2014-12-19 NOTE — Telephone Encounter (Signed)
Caller name:James Clayson Relation to TF:[id number] Call back [place]:  Reason for call: pt is needing rx  Please call when available for pick up  HYDROcodone-acetaminophen (NORCO) 10-325 MG per tablet

## 2014-12-20 NOTE — Telephone Encounter (Signed)
OK to refill requested med, same number same sig, same strength

## 2014-12-21 MED ORDER — HYDROCODONE-ACETAMINOPHEN 10-325 MG PO TABS: 1.0000 | ORAL_TABLET | Freq: Three times a day (TID) | ORAL | Status: DC | PRN

## 2014-12-21 NOTE — Telephone Encounter (Signed)
Printed and on counter for signature.  Called the patient to inform to pickup at the front desk today 12/21/14 after lunch.

## 2014-12-23 ENCOUNTER — Other Ambulatory Visit: Payer: Self-pay | Admitting: Family Medicine

## 2014-12-25 ENCOUNTER — Telehealth: Payer: Self-pay | Admitting: Family Medicine

## 2014-12-25 DIAGNOSIS — I10 Essential (primary) hypertension: Secondary | ICD-10-CM

## 2014-12-25 MED ORDER — CARVEDILOL 12.5 MG PO TABS: 12.5000 mg | ORAL_TABLET | Freq: Two times a day (BID) | ORAL | Status: DC

## 2014-12-25 NOTE — Telephone Encounter (Signed)
Corrected and sent in to [facility].  Called the patient informed per pt. Request sent in a #90 day supply to [facility].

## 2014-12-25 NOTE — Telephone Encounter (Signed)
Advise on increase please

## 2014-12-25 NOTE — Telephone Encounter (Signed)
When patient was in at her last visit  Dr B said to change the coreg 12.5 to 2 a day.  The rx was not updated to cover the extra pill a day  So she is out.  Please update with [facility] so she can get her meds today

## 2014-12-25 NOTE — Telephone Encounter (Signed)
Please update rx for Coreg 12.5 mg tab, 1 tab po bid, disp #60 local and 180 tabs with 1 rf to mail order

## 2015-01-03 ENCOUNTER — Encounter: Payer: Self-pay | Admitting: Family Medicine

## 2015-01-07 ENCOUNTER — Other Ambulatory Visit: Payer: Self-pay | Admitting: Cardiovascular Disease

## 2015-01-09 ENCOUNTER — Telehealth: Payer: 59 | Admitting: Nurse Practitioner

## 2015-01-09 DIAGNOSIS — J0100 Acute maxillary sinusitis, unspecified: Secondary | ICD-10-CM

## 2015-01-09 MED ORDER — FLUTICASONE PROPIONATE 50 MCG/ACT NA SUSP: 2.0000 | Freq: Every day | NASAL | Status: DC

## 2015-01-09 NOTE — Progress Notes (Signed)
We are sorry that you are not feeling well.  Here is how we plan to help!  Based on what you have shared with me it looks like you have sinusitis.  Sinusitis is inflammation and infection in the sinus cavities of the head.  Based on your presentation I believe you most likely have Acute Viral Sinusitis. This is an infection most likely caused by a virus.  There is not specific treatment for viral sinusitis other than to help you with the symptoms until the infection runs it's course.  You may use an oral decongestant such as Mucinex D or if you have glaucoma or high blood pressure use plain Mucinex.  Saline nasal spray help and can safely be used as often as needed for congestion, I have prescribed fluticason nasal spray. Spray two sprays in each nostril twice a day to help reduce your symptoms.  Some authorities believe that zinc sprays or the use of Echinacea may shorten the course of your symptoms.  Sinus infections are not as easily transmitted as other respiratory infection, however we still recommend that you avoid close contact with loved ones, especially the very young and elderly.  Remember to wash your hands thoroughly throughout the day as this is the number one way to prevent the spread of infection!  Home Care:  Only take medications as instructed by your medical team.  Complete the entire course of an antibiotic.  Do not take these medications with alcohol.  A steam or ultrasonic humidifier can help congestion.  You can place a towel over your head and breathe in the steam from hot water coming from a faucet.  Avoid close contacts especially the very young and the elderly.  Cover your mouth when you cough or sneeze.  Always remember to wash your hands.  Get Help Right Away If:  You develop worsening fever or sinus pain.  You develop a severe head ache or visual changes.  Your symptoms persist after you have completed your treatment plan.  Make sure you  Understand these  instructions.  Will watch your condition.  Will get help right away if you are not doing well or get worse.  Your e-visit answers were reviewed by a board certified advanced clinical practitioner to complete your personal care plan.  Depending on the condition, your plan could have included both over the counter or prescription medications.  If there is a problem please reply  once you have received a response from your provider.  Your safety is important to [place].  If you have drug allergies check your prescription carefully.    You can use MyChart to ask questions about today's visit, request a non-urgent call back, or ask for a work or school excuse.  You will get an e-mail in the next two days asking about your experience.  I hope that your e-visit has been valuable and will speed your recovery. Thank you for using e-visits.

## 2015-01-17 ENCOUNTER — Other Ambulatory Visit: Payer: Self-pay | Admitting: Family Medicine

## 2015-01-17 DIAGNOSIS — M25579 Pain in unspecified ankle and joints of unspecified foot: Secondary | ICD-10-CM

## 2015-01-17 NOTE — Telephone Encounter (Signed)
Caller name: Emony Dormer Relation to HT:[id number] Call back number: [phone] Pharmacy:  Reason for call: Pt's spouse is requesting med refill for HYDROcodone-acetaminophen (NORCO) 10-325 MG per tablet. Please advise.

## 2015-01-18 MED ORDER — HYDROCODONE-ACETAMINOPHEN 10-325 MG PO TABS: 1.0000 | ORAL_TABLET | Freq: Three times a day (TID) | ORAL | Status: DC | PRN

## 2015-01-18 NOTE — Telephone Encounter (Signed)
Called the patient informed to pickup hardcopy at the front desk. 

## 2015-01-18 NOTE — Telephone Encounter (Signed)
Last refill  #65 with 0 refills on 12/18/14 Last Office visit 12/12/14 Next sscheduled office visit 01/26/15  Advise on hydrocodone refill

## 2015-01-26 ENCOUNTER — Ambulatory Visit (INDEPENDENT_AMBULATORY_CARE_PROVIDER_SITE_OTHER): Payer: 59 | Admitting: Family Medicine

## 2015-01-26 ENCOUNTER — Ambulatory Visit (HOSPITAL_BASED_OUTPATIENT_CLINIC_OR_DEPARTMENT_OTHER)
Admission: RE | Admit: 2015-01-26 | Discharge: 2015-01-26 | Disposition: A | Discharge status: Home / Self Care | Payer: 59 | Source: Ambulatory Visit | Attending: Family Medicine | Admitting: Family Medicine

## 2015-01-26 ENCOUNTER — Encounter: Payer: Self-pay | Admitting: Family Medicine

## 2015-01-26 VITALS — BP 140/80 | HR 84 | Temp 98.1°F | Ht 66.0 in | Wt 162.0 lb

## 2015-01-26 DIAGNOSIS — K449 Diaphragmatic hernia without obstruction or gangrene: Secondary | ICD-10-CM

## 2015-01-26 DIAGNOSIS — I10 Essential (primary) hypertension: Secondary | ICD-10-CM

## 2015-01-26 DIAGNOSIS — M810 Age-related osteoporosis without current pathological fracture: Secondary | ICD-10-CM | POA: Diagnosis not present

## 2015-01-26 DIAGNOSIS — E782 Mixed hyperlipidemia: Secondary | ICD-10-CM

## 2015-01-26 DIAGNOSIS — K219 Gastro-esophageal reflux disease without esophagitis: Secondary | ICD-10-CM | POA: Diagnosis not present

## 2015-01-26 DIAGNOSIS — M25551 Pain in right hip: Secondary | ICD-10-CM | POA: Diagnosis not present

## 2015-01-26 DIAGNOSIS — F418 Other specified anxiety disorders: Secondary | ICD-10-CM | POA: Diagnosis not present

## 2015-01-26 DIAGNOSIS — R739 Hyperglycemia, unspecified: Secondary | ICD-10-CM

## 2015-01-26 MED ORDER — DILTIAZEM HCL ER COATED BEADS 180 MG PO CP24: 180.0000 mg | ORAL_CAPSULE | Freq: Every day | ORAL | Status: DC

## 2015-01-26 MED ORDER — CYANOCOBALAMIN 1000 MCG/ML IJ SOLN
1000.0000 ug | Freq: Once | INTRAMUSCULAR | Status: AC
  Administered 2015-01-26: 1000 ug via INTRAMUSCULAR

## 2015-01-26 MED ORDER — ESCITALOPRAM OXALATE 10 MG PO TABS: ORAL_TABLET | ORAL | Status: DC

## 2015-01-26 MED ORDER — ONDANSETRON 4 MG PO TBDP: 4.0000 mg | ORAL_TABLET | Freq: Three times a day (TID) | ORAL | Status: DC | PRN

## 2015-01-26 NOTE — Patient Instructions (Signed)
Sleep Apnea  Sleep apnea is a sleep disorder characterized by abnormal pauses in breathing while you sleep. When your breathing pauses, the level of oxygen in your blood decreases. This causes you to move out of deep sleep and into light sleep. As a result, your quality of sleep is poor, and the system that carries your blood throughout your body (cardiovascular system) experiences stress. If sleep apnea remains untreated, the following conditions can develop:  High blood pressure (hypertension).  Coronary artery disease.  Inability to achieve or maintain an erection (impotence).  Impairment of your thought process (cognitive dysfunction). There are three types of sleep apnea: 1. Obstructive sleep apnea--Pauses in breathing during sleep because of a blocked airway. 2. Central sleep apnea--Pauses in breathing during sleep because the area of the brain that controls your breathing does not send the correct signals to the muscles that control breathing. 3. Mixed sleep apnea--A combination of both obstructive and central sleep apnea. RISK FACTORS The following risk factors can increase your risk of developing sleep apnea:  Being overweight.  Smoking.  Having narrow passages in your nose and throat.  Being of older age.  Being female.  Alcohol use.  Sedative and tranquilizer use.  Ethnicity. Among individuals younger than 35 years, African Americans are at increased risk of sleep apnea. SYMPTOMS   Difficulty staying asleep.  Daytime sleepiness and fatigue.  Loss of energy.  Irritability.  Loud, heavy snoring.  Morning headaches.  Trouble concentrating.  Forgetfulness.  Decreased interest in sex. DIAGNOSIS  In order to diagnose sleep apnea, your caregiver will perform a physical examination. Your caregiver may suggest that you take a home sleep test. Your caregiver may also recommend that you spend the night in a sleep lab. In the sleep lab, several monitors record  information about your heart, lungs, and brain while you sleep. Your leg and arm movements and blood oxygen level are also recorded. TREATMENT The following actions may help to resolve mild sleep apnea:  Sleeping on your side.   Using a decongestant if you have nasal congestion.   Avoiding the use of depressants, including alcohol, sedatives, and narcotics.   Losing weight and modifying your diet if you are overweight. There also are devices and treatments to help open your airway:  Oral appliances. These are custom-made mouthpieces that shift your lower jaw forward and slightly open your bite. This opens your airway.  Devices that create positive airway pressure. This positive pressure "splints" your airway open to help you breathe better during sleep. The following devices create positive airway pressure:  Continuous positive airway pressure (CPAP) device. The CPAP device creates a continuous level of air pressure with an air pump. The air is delivered to your airway through a mask while you sleep. This continuous pressure keeps your airway open.  Nasal expiratory positive airway pressure (EPAP) device. The EPAP device creates positive air pressure as you exhale. The device consists of single-use valves, which are inserted into each nostril and held in place by adhesive. The valves create very little resistance when you inhale but create much more resistance when you exhale. That increased resistance creates the positive airway pressure. This positive pressure while you exhale keeps your airway open, making it easier to breath when you inhale again.  Bilevel positive airway pressure (BPAP) device. The BPAP device is used mainly in patients with central sleep apnea. This device is similar to the CPAP device because it also uses an air pump to deliver continuous air pressure   through a mask. However, with the BPAP machine, the pressure is set at two different levels. The pressure when you  exhale is lower than the pressure when you inhale.  Surgery. Typically, surgery is only done if you cannot comply with less invasive treatments or if the less invasive treatments do not improve your condition. Surgery involves removing excess tissue in your airway to create a wider passage way. Document Released: 07/25/2002 Document Revised: 11/29/2012 Document Reviewed: 12/11/2011 Exit[employer] Patient Information 2015 [employer]. This information is not intended to replace advice given to you by your health care provider. Make sure you discuss any questions you have with your health care provider.  

## 2015-01-26 NOTE — Progress Notes (Signed)
Pre visit review using our clinic review tool, if applicable. No additional management support is needed unless otherwise documented below in the visit note. 

## 2015-01-31 ENCOUNTER — Telehealth: Payer: Self-pay | Admitting: Family Medicine

## 2015-01-31 NOTE — Telephone Encounter (Addendum)
Relation to pt: self  Call back number: [phone] Pharmacy: [facility] # [address], [place] [phone] (Phone) [phone] (Fax)         Reason for call:  Pt states ondansetron (ZOFRAN-ODT) 4 MG disintegrating tablet is causing a yeast infection. Pt states she is expercicing vaginal itching

## 2015-01-31 NOTE — Telephone Encounter (Signed)
Spoke with patient and advised that it is uncommon for Zofran to lead to a yeast infection. Patient states that she has been wearing tight clothing and working in the yard. Advised that is more than likely the cause of the itching. States that she has tried OTC with no relief.  Please advise.

## 2015-01-31 NOTE — Telephone Encounter (Signed)
Patient calling back regarding this. Hoping to have something prescribed by end of day.

## 2015-01-31 NOTE — Telephone Encounter (Signed)
oK TO SEND IN RX FOR DIFLUCAN 10 MG TABS 1 tab po q week x 2 weeks disp #2

## 2015-02-01 MED ORDER — FLUCONAZOLE 150 MG PO TABS: ORAL_TABLET | ORAL | Status: DC

## 2015-02-01 NOTE — Telephone Encounter (Signed)
Diflucan 150 mg tab 1 tab po q week x 2 weeks. Disp #2

## 2015-02-01 NOTE — Telephone Encounter (Signed)
Sent in [place] as instructed

## 2015-02-01 NOTE — Telephone Encounter (Signed)
You stated diflucan 10mg  did you mean diflucan 100 mg tabs as I did not see 10 mg in the system

## 2015-02-04 ENCOUNTER — Encounter: Payer: Self-pay | Admitting: Family Medicine

## 2015-02-04 DIAGNOSIS — M25551 Pain in right hip: Secondary | ICD-10-CM | POA: Insufficient documentation

## 2015-02-04 NOTE — Assessment & Plan Note (Signed)
Well controlled, no changes to meds. Encouraged heart healthy diet such as the DASH diet and exercise as tolerated.  °

## 2015-02-04 NOTE — Assessment & Plan Note (Signed)
Avoid offending foods, start probiotics. Do not eat large meals in late evening and consider raising head of bed.  

## 2015-02-04 NOTE — Progress Notes (Signed)
Megan [name]  [id number] [birth year] 02/04/2015      Progress Note-Follow Up  Subjective  Chief Complaint  Chief Complaint  Patient presents with  . Follow-up    HPI  Patient is a 64 y.o. female in today for routine medical care. Patient is in today for follow-up on numerous concerns. She continues to deal with significant stress and restless sleep. She is struggling with illness of numerous family members and financial stressors. She struggles with chronic fatigue and hypersomnolence. She's had intermittent trouble with right hip pain worse with walking and shooting pains extend to mid thigh. She also has right lower quadrant pain but this is also worse with weightbearing. It comes and goes. No fevers chills. No change in bowel habits. Denies CP/palp/SOB/HA/congestion/fevers/GI or GU c/o. Taking meds as prescribed Past Medical History  Diagnosis Date  . GERD (gastroesophageal reflux disease)   . Depression   . Anxiety   . Insomnia   . IBS (irritable bowel syndrome)   . Diverticulosis   . Barrett esophagus   . Hiatal hernia   . Unspecified menopausal and postmenopausal disorder   . Vitamin B12 deficiency   . Blind loop syndrome   . Hepatic cyst   . Unspecified hypothyroidism   . Diarrhea   . Family history of malignant neoplasm of gastrointestinal tract     multiple members  . Endometrial hyperplasia 02/27/2006    BENIGN ENDO BX ON 02/2007  . C. difficile diarrhea   . Cystocele, midline   . Rectocele   . Uterine prolapse without mention of vaginal wall prolapse   . Chest pain, atypical 07/14/2011  . Leaky heart valve   . Hiatal hernia   . 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 Thrush 07/21/2011  . Rectal bleeding 10/10/2011  . Sinusitis acute 02/03/2012  . Contact dermatitis 03/23/2012  . Headache(784.0) 04/15/2012  . 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 Hematuria 04/27/2012  . Allergic state 12/31/2012  . 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 Paronychia of great toe, left  06/19/2013  . Low back pain 08/15/2013  . Osteoporosis 05/2011    -2.5 Lt femoral neck  . 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 Hypertension   . Asthma   . 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 Preventative health care 09/11/2014  . Hyperlipidemia, mixed 12/17/2014    Past Surgical History  Procedure Laterality Date  . Appendectomy    . Uterine fibroid surgery    . Hysteroscopy      POLYP    Family History  Problem Relation Age of Onset  . Colon cancer Mother   . Diabetes Mother   . Hypertension Mother   . Cancer Mother     colon  . Colon polyps Father   . Parkinson's disease Father   . Colon cancer Maternal Aunt   . Colon cancer Maternal Grandmother   . Breast cancer Maternal Grandmother   . Cancer Maternal Grandmother     colon  . Diabetes Paternal Grandmother   . Breast cancer Paternal Grandmother   . Cancer Paternal Grandmother     breast  . Colon cancer Cousin     X3  . Ovarian cancer Cousin   . Allergies Sister   . Arthritis Brother     b/l hip replace  . Alcohol abuse Daughter   . Arthritis Son   . Hypertension Son   . Allergies Son   . Osteoporosis Son   . Osteoporosis Son   . Arthritis Son     back  disease, DDD  . Allergies Son   . Arthritis Son   . Other Son     RSD, IBS  . Hypertension Paternal Grandfather   . Heart disease Paternal Grandfather     aneurysm    History   Social History  . Marital Status: Married    Spouse Name: N/A  . Number of Children: 4  . Years of Education: N/A   Occupational History  . Retired    Social History Main Topics  . Smoking status: Former Smoker    Quit date: 08/19/1995  . Smokeless tobacco: Never Used  . Alcohol Use: No  . Drug Use: No  . Sexual Activity: Not on file   Other Topics Concern  . Not on file   Social History Narrative    Current Outpatient Prescriptions on File Prior to Visit  Medication Sig Dispense Refill  . carvedilol (COREG) 12.5 MG tablet Take 1 tablet (12.5 mg total) by mouth 2 (two)  times daily. 180 tablet 3  . cyclobenzaprine (FLEXERIL) 10 MG tablet Take 1 tablet (10 mg total) by mouth 3 (three) times daily as needed for muscle spasms. 40 tablet 1  . diazepam (VALIUM) 10 MG tablet Take 1 tablet (10 mg total) by mouth every 12 (twelve) hours as needed for anxiety. 60 tablet 2  . dicyclomine (BENTYL) 10 MG capsule Take 1 capsule (10 mg total) by mouth 3 (three) times daily before meals. 30 capsule 0  . fluticasone (FLONASE) 50 MCG/ACT nasal spray USE 2 SPRAYS IN EACH NOSTRIL EVERY DAY 16 g 2  . fluticasone (FLONASE) 50 MCG/ACT nasal spray Place 2 sprays into both nostrils daily. 16 g 6  . HYDROcodone-acetaminophen (NORCO) 10-325 MG per tablet Take 1 tablet by mouth every 8 (eight) hours as needed. 65 tablet 0  . hyoscyamine (LEVBID) 0.375 MG 12 hr tablet TAKE 2 TABLETS BY MOUTH TWICE DAILY AS NEEDED 360 tablet 2  . lactobacillus acidophilus (BACID) TABS tablet Take 2 tablets by mouth 3 (three) times daily.    Marland Kitchen omeprazole (PRILOSEC) 40 MG capsule Take 1 capsule by mouth 2 times daily before meals.  Take 30 min before breakfast and dinner.    . promethazine (PHENERGAN) 25 MG tablet TAKE 1 TABLET BY MOUTH THREE TIMES DAILY AS NEEDED FOR NAUSEA 30 tablet 0  . ranitidine (ZANTAC) 300 MG tablet Take 1 tablet (300 mg total) by mouth at bedtime. 90 tablet 1  . VENTOLIN HFA 108 (90 BASE) MCG/ACT inhaler INHALE 2 PUFFS BY MOUTH EVERY 6 HOURS AS NEEDED 18 each 1  . zolpidem (AMBIEN) 10 MG tablet Take 1 tablet (10 mg total) by mouth at bedtime as needed for sleep. 15 tablet 2   No current facility-administered medications on file prior to visit.    Allergies  Allergen Reactions  . Sulfamethoxazole Shortness Of Breath    chest tightness  . Iohexol   . Iodinated Diagnostic Agents     Pt states at Dr.Grapeys office 15 years ago blacked out for 2 hours during injection for IVP. Was told never to have IV dye again.     Review of Systems  Review of Systems  Constitutional: Positive  for malaise/fatigue. Negative for fever.  HENT: Negative for congestion.   Eyes: Negative for discharge.  Respiratory: Negative for shortness of breath.   Cardiovascular: Negative for chest pain, palpitations and leg swelling.  Gastrointestinal: Positive for abdominal pain. Negative for nausea and diarrhea.  Genitourinary: Negative for dysuria.  Musculoskeletal: Negative for falls.  Skin: Negative for rash.  Neurological: Negative for loss of consciousness and headaches.  Endo/Heme/Allergies: Negative for polydipsia.  Psychiatric/Behavioral: Positive for depression. Negative for suicidal ideas. The patient is nervous/anxious. The patient does not have insomnia.     Objective  BP 140/80 mmHg  Pulse 84  Temp(Src) 98.1 F (36.7 C) (Oral)  Ht 5\' 6"  (1.676 m)  Wt 162 lb (73.483 kg)  BMI 26.16 kg/m2  SpO2 96%  Physical Exam  Physical Exam  Constitutional: She is oriented to person, place, and time and well-developed, well-nourished, and in no distress. No distress.  HENT:  Head: Normocephalic and atraumatic.  Eyes: Conjunctivae are normal.  Neck: Neck supple. No thyromegaly present.  Cardiovascular: Normal rate, regular rhythm and normal heart sounds.   No murmur heard. Pulmonary/Chest: Effort normal and breath sounds normal. She has no wheezes.  Abdominal: Soft. Bowel sounds are normal. She exhibits no distension and no mass. There is no rebound and no guarding.  Musculoskeletal: She exhibits no edema.  Lymphadenopathy:    She has no cervical adenopathy.  Neurological: She is alert and oriented to person, place, and time.  Skin: Skin is warm and dry. No rash noted. She is not diaphoretic.  Psychiatric: Memory, affect and judgment normal.    Lab Results  Component Value Date   TSH 1.29 12/12/2014   Lab Results  Component Value Date   WBC 6.8 12/12/2014   HGB 14.0 12/12/2014   HCT 41.0 12/12/2014   MCV 92.2 12/12/2014   PLT 306.0 12/12/2014   Lab Results  Component  Value Date   CREATININE 0.69 12/12/2014   BUN 9 12/12/2014   NA 140 12/12/2014   K 4.6 12/12/2014   CL 104 12/12/2014   CO2 24 12/12/2014   Lab Results  Component Value Date   ALT 20 12/12/2014   AST 19 12/12/2014   ALKPHOS 92 12/12/2014   BILITOT 0.4 12/12/2014   Lab Results  Component Value Date   CHOL 180 12/12/2014   Lab Results  Component Value Date   HDL 47.00 12/12/2014   Lab Results  Component Value Date   LDLCALC 107* 12/12/2014   Lab Results  Component Value Date   TRIG 129.0 12/12/2014   Lab Results  Component Value Date   CHOLHDL 4 12/12/2014     Assessment & Plan  Gastroesophageal reflux disease with hiatal hernia Avoid offending foods, start probiotics. Do not eat large meals in late evening and consider raising head of bed.   Essential hypertension Well controlled, no changes to meds. Encouraged heart healthy diet such as the DASH diet and exercise as tolerated.   Right hip pain Worsening, referred to orthopaedics for further consideration, xrays ordered  Hyperglycemia hgba1c acceptable, minimize simple carbs. Increase exercise as tolerated.   Hyperlipidemia, mixed Encouraged heart healthy diet, increase exercise, avoid trans fats, consider a krill oil cap daily

## 2015-02-04 NOTE — Assessment & Plan Note (Signed)
hgba1c acceptable, minimize simple carbs. Increase exercise as tolerated.  

## 2015-02-04 NOTE — Assessment & Plan Note (Signed)
Encouraged heart healthy diet, increase exercise, avoid trans fats, consider a krill oil cap daily 

## 2015-02-04 NOTE — Assessment & Plan Note (Signed)
Worsening, referred to orthopaedics for further consideration, xrays ordered

## 2015-02-05 ENCOUNTER — Other Ambulatory Visit (HOSPITAL_COMMUNITY)
Admission: RE | Admit: 2015-02-05 | Discharge: 2015-02-05 | Disposition: A | Discharge status: Home / Self Care | Payer: 59 | Source: Ambulatory Visit | Attending: Gynecology | Admitting: Gynecology

## 2015-02-05 ENCOUNTER — Ambulatory Visit (INDEPENDENT_AMBULATORY_CARE_PROVIDER_SITE_OTHER): Payer: 59 | Admitting: Gynecology

## 2015-02-05 ENCOUNTER — Encounter: Payer: Self-pay | Admitting: Gynecology

## 2015-02-05 VITALS — BP 140/88 | Ht 66.0 in | Wt 161.0 lb

## 2015-02-05 DIAGNOSIS — N8111 Cystocele, midline: Secondary | ICD-10-CM | POA: Diagnosis not present

## 2015-02-05 DIAGNOSIS — Z01419 Encounter for gynecological examination (general) (routine) without abnormal findings: Secondary | ICD-10-CM | POA: Insufficient documentation

## 2015-02-05 DIAGNOSIS — Z1151 Encounter for screening for human papillomavirus (HPV): Secondary | ICD-10-CM | POA: Diagnosis present

## 2015-02-05 DIAGNOSIS — N952 Postmenopausal atrophic vaginitis: Secondary | ICD-10-CM | POA: Diagnosis not present

## 2015-02-05 DIAGNOSIS — M810 Age-related osteoporosis without current pathological fracture: Secondary | ICD-10-CM

## 2015-02-05 DIAGNOSIS — N816 Rectocele: Secondary | ICD-10-CM

## 2015-02-05 NOTE — Addendum Note (Signed)
Addended by: Nelva Nay on: 02/05/2015 09:34 AM   Modules accepted: Orders

## 2015-02-05 NOTE — Patient Instructions (Addendum)
Call to Schedule your mammogram  Facilities in [place]: 1)  [facility], [address] Phone: [phone] 2)  [facility]. [facility], [address] Phone: [phone] 3)  Dr. Bertrand at [facility]  [address] Phone: [phone]     Mammogram A mammogram is an X-ray test to find changes in a woman's breast. You should get a mammogram if:  You are 40 years of age or older  You have risk factors.   Your doctor recommends that you have one.  BEFORE THE TEST  Do not schedule the test the week before your period, especially if your breasts are sore during this time.  On the day of your mammogram:  Wash your breasts and armpits well. After washing, do not put on any deodorant or talcum powder on until after your test.   Eat and drink as you usually do.   Take your medicines as usual.   If you are diabetic and take insulin, make sure you:   Eat before coming for your test.   Take your insulin as usual.   If you cannot keep your appointment, call before the appointment to cancel. Schedule another appointment.  TEST  You will need to undress from the waist up. You will put on a hospital gown.   Your breast will be put on the mammogram machine, and it will press firmly on your breast with a piece of plastic called a compression paddle. This will make your breast flatter so that the machine can X-ray all parts of your breast.   Both breasts will be X-rayed. Each breast will be X-rayed from above and from the side. An X-ray might need to be taken again if the picture is not good enough.   The mammogram will last about 15 to 30 minutes.  AFTER THE TEST Finding out the results of your test Ask when your test results will be ready. Make sure you get your test results.  Document Released: 10/31/2008 Document Revised: 07/24/2011 Document Reviewed: 10/31/2008 ExitCare Patient  Information 2012 [employer].  You may obtain a copy of any labs that were done today by logging onto MyChart as outlined in the instructions provided with your AVS (after visit summary). The office will not call with normal lab results but certainly if there are any significant abnormalities then we will contact you.   Health Maintenance, Female A healthy lifestyle and preventative care can promote health and wellness.  Maintain regular health, dental, and eye exams.  Eat a healthy diet. Foods like vegetables, fruits, whole grains, low-fat dairy products, and lean protein foods contain the nutrients you need without too many calories. Decrease your intake of foods high in solid fats, added sugars, and salt. Get information about a proper diet from your caregiver, if necessary.  Regular physical exercise is one of the most important things you can do for your health. Most adults should get at least 150 minutes of moderate-intensity exercise (any activity that increases your heart rate and causes you to sweat) each week. In addition, most adults need muscle-strengthening exercises on 2 or more days a week.   Maintain a healthy weight. The body mass index (BMI) is a screening tool to identify possible weight problems. It provides an estimate of body fat based on height and weight. Your caregiver can help determine your BMI, and can help you achieve or maintain a healthy weight. For adults   20 years and older:  A BMI below 18.5 is considered underweight.  A BMI of 18.5 to 24.9 is normal.  A BMI of 25 to 29.9 is considered overweight.  A BMI of 30 and above is considered obese.  Maintain normal blood lipids and cholesterol by exercising and minimizing your intake of saturated fat. Eat a balanced diet with plenty of fruits and vegetables. Blood tests for lipids and cholesterol should begin at age 20 and be repeated every 5 years. If your lipid or cholesterol levels are high, you are over 50, or  you are a high risk for heart disease, you may need your cholesterol levels checked more frequently.Ongoing high lipid and cholesterol levels should be treated with medicines if diet and exercise are not effective.  If you smoke, find out from your caregiver how to quit. If you do not use tobacco, do not start.  Lung cancer screening is recommended for adults aged 55 80 years who are at high risk for developing lung cancer because of a history of smoking. Yearly low-dose computed tomography (CT) is recommended for people who have at least a 30-pack-year history of smoking and are a current smoker or have quit within the past 15 years. A pack year of smoking is smoking an average of 1 pack of cigarettes a day for 1 year (for example: 1 pack a day for 30 years or 2 packs a day for 15 years). Yearly screening should continue until the smoker has stopped smoking for at least 15 years. Yearly screening should also be stopped for people who develop a health problem that would prevent them from having lung cancer treatment.  If you are pregnant, do not drink alcohol. If you are breastfeeding, be very cautious about drinking alcohol. If you are not pregnant and choose to drink alcohol, do not exceed 1 drink per day. One drink is considered to be 12 ounces (355 mL) of beer, 5 ounces (148 mL) of wine, or 1.5 ounces (44 mL) of liquor.  Avoid use of street drugs. Do not share needles with anyone. Ask for help if you need support or instructions about stopping the use of drugs.  High blood pressure causes heart disease and increases the risk of stroke. Blood pressure should be checked at least every 1 to 2 years. Ongoing high blood pressure should be treated with medicines, if weight loss and exercise are not effective.  If you are 55 to 65 years old, ask your caregiver if you should take aspirin to prevent strokes.  Diabetes screening involves taking a blood sample to check your fasting blood sugar level. This  should be done once every 3 years, after age 45, if you are within normal weight and without risk factors for diabetes. Testing should be considered at a younger age or be carried out more frequently if you are overweight and have at least 1 risk factor for diabetes.  Breast cancer screening is essential preventative care for women. You should practice "breast self-awareness." This means understanding the normal appearance and feel of your breasts and may include breast self-examination. Any changes detected, no matter how small, should be reported to a caregiver. Women in their 20s and 30s should have a clinical breast exam (CBE) by a caregiver as part of a regular health exam every 1 to 3 years. After age 40, women should have a CBE every year. Starting at age 40, women should consider having a mammogram (breast X-ray) every year. Women who have a   family history of breast cancer should talk to their caregiver about genetic screening. Women at a high risk of breast cancer should talk to their caregiver about having an MRI and a mammogram every year.  Breast cancer gene (BRCA)-related cancer risk assessment is recommended for women who have family members with BRCA-related cancers. BRCA-related cancers include breast, ovarian, tubal, and peritoneal cancers. Having family members with these cancers may be associated with an increased risk for harmful changes (mutations) in the breast cancer genes BRCA1 and BRCA2. Results of the assessment will determine the need for genetic counseling and BRCA1 and BRCA2 testing.  The Pap test is a screening test for cervical cancer. Women should have a Pap test starting at age 33. Between ages 75 and 14, Pap tests should be repeated every 2 years. Beginning at age 4, you should have a Pap test every 3 years as long as the past 3 Pap tests have been normal. If you had a hysterectomy for a problem that was not cancer or a condition that could lead to cancer, then you no longer  need Pap tests. If you are between ages 72 and 12, and you have had normal Pap tests going back 10 years, you no longer need Pap tests. If you have had past treatment for cervical cancer or a condition that could lead to cancer, you need Pap tests and screening for cancer for at least 20 years after your treatment. If Pap tests have been discontinued, risk factors (such as a new sexual partner) need to be reassessed to determine if screening should be resumed. Some women have medical problems that increase the chance of getting cervical cancer. In these cases, your caregiver may recommend more frequent screening and Pap tests.  The human papillomavirus (HPV) test is an additional test that may be used for cervical cancer screening. The HPV test looks for the virus that can cause the cell changes on the cervix. The cells collected during the Pap test can be tested for HPV. The HPV test could be used to screen women aged 84 years and older, and should be used in women of any age who have unclear Pap test results. After the age of 73, women should have HPV testing at the same frequency as a Pap test.  Colorectal cancer can be detected and often prevented. Most routine colorectal cancer screening begins at the age of 56 and continues through age 76. However, your caregiver may recommend screening at an earlier age if you have risk factors for colon cancer. On a yearly basis, your caregiver may provide home test kits to check for hidden blood in the stool. Use of a small camera at the end of a tube, to directly examine the colon (sigmoidoscopy or colonoscopy), can detect the earliest forms of colorectal cancer. Talk to your caregiver about this at age 20, when routine screening begins. Direct examination of the colon should be repeated every 5 to 10 years through age 83, unless early forms of pre-cancerous polyps or small growths are found.  Hepatitis C blood testing is recommended for all people born from [birth year]  through 1965 and any individual with known risks for hepatitis C.  Practice safe sex. Use condoms and avoid high-risk sexual practices to reduce the spread of sexually transmitted infections (STIs). Sexually active women aged 28 and younger should be checked for Chlamydia, which is a common sexually transmitted infection. Older women with new or multiple partners should also be tested for Chlamydia. Testing for  other STIs is recommended if you are sexually active and at increased risk.  Osteoporosis is a disease in which the bones lose minerals and strength with aging. This can result in serious bone fractures. The risk of osteoporosis can be identified using a bone density scan. Women ages 65 and over and women at risk for fractures or osteoporosis should discuss screening with their caregivers. Ask your caregiver whether you should be taking a calcium supplement or vitamin D to reduce the rate of osteoporosis.  Menopause can be associated with physical symptoms and risks. Hormone replacement therapy is available to decrease symptoms and risks. You should talk to your caregiver about whether hormone replacement therapy is right for you.  Use sunscreen. Apply sunscreen liberally and repeatedly throughout the day. You should seek shade when your shadow is shorter than you. Protect yourself by wearing long sleeves, pants, a wide-brimmed hat, and sunglasses year round, whenever you are outdoors.  Notify your caregiver of new moles or changes in moles, especially if there is a change in shape or color. Also notify your caregiver if a mole is larger than the size of a pencil eraser.  Stay current with your immunizations. Document Released: 02/17/2011 Document Revised: 11/29/2012 Document Reviewed: 02/17/2011 ExitCare Patient Information 2014 [employer].   

## 2015-02-05 NOTE — Progress Notes (Signed)
Megan [name] Feb 02, 1951 [id number]        63 y.o.  [id number] for annual exam.  Several issues noted below.  Past medical history,surgical history, problem list, medications, allergies, family history and social history were all reviewed and documented as reviewed in the EPIC chart.  ROS:  Performed with pertinent positives and negatives included in the history, assessment and plan.   Additional significant findings :  none   Exam: Kim Counsellor Vitals:   02/05/15 0815  BP: 140/88  Height: 5\' 6"  (1.676 m)  Weight: 161 lb (73.029 kg)   General appearance:  Normal affect, orientation and appearance. Skin: Grossly normal HEENT: Without gross lesions.  No cervical or supraclavicular adenopathy. Thyroid normal.  Lungs:  Clear without wheezing, rales or rhonchi Cardiac: RR, without RMG Abdominal:  Soft, nontender, without masses, guarding, rebound, organomegaly or hernia Breasts:  Examined lying and sitting without masses, retractions, discharge or axillary adenopathy. Pelvic:  Ext/BUS/vagina with atrophic changes. First to second degree cystocele. Mild uterine prolapse. First degree rectocele  Cervix atrophic. Pap/HPV  Uterus anteverted, normal size, shape and contour, midline and mobile nontender   Adnexa  Without masses or tenderness    Anus and perineum  Normal   Rectovaginal  Normal sphincter tone without palpated masses or tenderness.    Assessment/Plan:  64 y.o. [id number] female for annual exam.   1. Cystocele/uterine prolapse/rectocele. Patient notes some pressure at times. Not having significant issues as far as incontinence. Options include observation, pessary and surgery to include TVH anterior/posterior repair discussed. At this point patient does not feel symptoms warrant intervention. Will monitor for now and call if any issues. 2. Postmenopausal/atrophic changes. Without significant hot flashes, night sweats, vaginal dryness. No vaginal bleeding. Continue to  monitor and report any vaginal bleeding. 3. Osteoporosis.  DEXA 2012 T score -2.5. Had discussed treatment options in the past but never started on anything due to all of her GI issues.  I again rediscussed treatment options to include Prolia and Reclast. We'll get baseline DEXA now and follow up for treatment option discussion. 4. Mammography 2014. Patient knows she is overdue and agrees to schedule. SBE monthly reviewed. 5. Pap smear 2012.  Pap/HPV today. No history of significant abnormal Pap smears. 6. Colonoscopy 2016. Repeat at their recommended interval. 7. Health maintenance. No routine blood work done as this is done at her primary physician's office. Follow for DEXA and treatment option discussion. Otherwise annual exam in one year     Anastasio Auerbach MD, 8:44 AM 02/05/2015

## 2015-02-06 LAB — URINALYSIS W MICROSCOPIC + REFLEX CULTURE
BILIRUBIN URINE: NEGATIVE
Bacteria, UA: NONE SEEN
CASTS: NONE SEEN
CRYSTALS: NONE SEEN
GLUCOSE, UA: NEGATIVE mg/dL
Hgb urine dipstick: NEGATIVE
Ketones, ur: NEGATIVE mg/dL
NITRITE: NEGATIVE
Protein, ur: NEGATIVE mg/dL
SPECIFIC GRAVITY, URINE: 1.008 (ref 1.005–1.030)
Squamous Epithelial / LPF: NONE SEEN
Urobilinogen, UA: 0.2 mg/dL (ref 0.0–1.0)
pH: 6.5 (ref 5.0–8.0)

## 2015-02-06 LAB — CYTOLOGY - PAP

## 2015-02-08 ENCOUNTER — Other Ambulatory Visit: Payer: Self-pay | Admitting: Gynecology

## 2015-02-08 MED ORDER — AMPICILLIN 500 MG PO CAPS: 500.0000 mg | ORAL_CAPSULE | Freq: Four times a day (QID) | ORAL | Status: DC

## 2015-02-09 LAB — URINE CULTURE: Colony Count: >=100000

## 2015-02-14 ENCOUNTER — Other Ambulatory Visit: Payer: Self-pay | Admitting: Family Medicine

## 2015-02-14 NOTE — Telephone Encounter (Signed)
Relation to pt: self Call back number: [phone] Pharmacy: [id number] - [place], [place] [phone] (Phone) [phone] (Fax)          Reason for call:  Pt requesting a Rx for her hemorroids.

## 2015-02-15 MED ORDER — HYDROCORTISONE ACETATE 25 MG RE SUPP: RECTAL | Status: DC

## 2015-02-15 NOTE — Addendum Note (Signed)
Addended by: Sharon Seller B on: 02/15/2015 07:45 AM   Modules accepted: Orders

## 2015-02-16 ENCOUNTER — Telehealth: Payer: Self-pay | Admitting: Family Medicine

## 2015-02-16 NOTE — Telephone Encounter (Signed)
Relation to pt: self  Call back number: [phone] Pharmacy: [facility] # [address] [place] [phone] (Phone) [phone] (Fax)         Reason for call:  Pt states at last OV 01/26/2015, MD gave pt choices regarding serveral medication. Pt would like to try cymbalta please advise

## 2015-02-17 NOTE — Telephone Encounter (Signed)
OK to start Cymbalta 30 mg caps, 1 cap po daily disp #30 with 1 refill and then have her come in for follow up in next 4-6 weeks. Warn regarding possible nausea, insomnia, hypersomnia, irritability. All unlikely but could happen and will probably improve. If any other concerns occur then she should call and let us know.

## 2015-02-18 ENCOUNTER — Encounter: Payer: Self-pay | Admitting: Family Medicine

## 2015-02-20 ENCOUNTER — Other Ambulatory Visit: Payer: Self-pay | Admitting: Family Medicine

## 2015-02-20 DIAGNOSIS — M25579 Pain in unspecified ankle and joints of unspecified foot: Secondary | ICD-10-CM

## 2015-02-20 MED ORDER — PROMETHAZINE HCL 25 MG PO TABS: ORAL_TABLET | ORAL | Status: DC

## 2015-02-20 MED ORDER — HYDROCODONE-ACETAMINOPHEN 10-325 MG PO TABS: 1.0000 | ORAL_TABLET | Freq: Three times a day (TID) | ORAL | Status: DC | PRN

## 2015-02-20 MED ORDER — ALPRAZOLAM 2 MG PO TABS: 2.0000 mg | ORAL_TABLET | Freq: Two times a day (BID) | ORAL | Status: DC | PRN

## 2015-02-20 MED ORDER — DULOXETINE HCL 30 MG PO CPEP: 30.0000 mg | ORAL_CAPSULE | Freq: Every day | ORAL | Status: DC

## 2015-02-20 NOTE — Telephone Encounter (Signed)
Printed and on counter for signature. 

## 2015-02-20 NOTE — Telephone Encounter (Signed)
Patient informed of instructions regarding cymbalta and sent in to local pharmacy.  Also informed of all PCP instructions.  The patient did verbalize understainding of information.

## 2015-02-20 NOTE — Telephone Encounter (Signed)
Faxed hardcopy for Alprazolam to [facility]  Patient informed.

## 2015-02-22 ENCOUNTER — Ambulatory Visit (INDEPENDENT_AMBULATORY_CARE_PROVIDER_SITE_OTHER): Payer: 59

## 2015-02-22 DIAGNOSIS — M810 Age-related osteoporosis without current pathological fracture: Secondary | ICD-10-CM

## 2015-02-28 ENCOUNTER — Encounter: Payer: Self-pay | Admitting: Gynecology

## 2015-02-28 ENCOUNTER — Telehealth: Payer: Self-pay | Admitting: Gynecology

## 2015-02-28 NOTE — Telephone Encounter (Signed)
Pt informed with the below note. 

## 2015-02-28 NOTE — Telephone Encounter (Signed)
Tell patient that her bone density overall is stable. At this point I would recommend monitoring and repeating the bone density in 2 years.

## 2015-03-21 ENCOUNTER — Telehealth: Payer: Self-pay | Admitting: Family Medicine

## 2015-03-21 ENCOUNTER — Other Ambulatory Visit: Payer: Self-pay | Admitting: Family Medicine

## 2015-03-21 ENCOUNTER — Telehealth: Payer: 59 | Admitting: Family

## 2015-03-21 DIAGNOSIS — M25579 Pain in unspecified ankle and joints of unspecified foot: Secondary | ICD-10-CM

## 2015-03-21 DIAGNOSIS — M545 Low back pain: Secondary | ICD-10-CM

## 2015-03-21 MED ORDER — CYCLOBENZAPRINE HCL 10 MG PO TABS: 10.0000 mg | ORAL_TABLET | Freq: Three times a day (TID) | ORAL | Status: DC | PRN

## 2015-03-21 MED ORDER — NAPROXEN 500 MG PO TABS: 500.0000 mg | ORAL_TABLET | Freq: Two times a day (BID) | ORAL | Status: DC

## 2015-03-21 NOTE — Progress Notes (Signed)
We are sorry that you are not feeling well.  Here is how we plan to help!  Based on what you have shared with me it looks like you mostly have acute back pain.  Acute back pain is defined as musculoskeletal pain that can resolve in 1-3 weeks with conservative treatment.  I have prescribed Naprosyn 500 mg twice a day non-steroid anti-inflammatory (NSAID) as well as Flexeril 10 mg every eight hours as needed which is a muscle relaxer.  Some patients experience stomach irritation or in increased heartburn with anti-inflammatory drugs.  Please keep in mind that muscle relaxer's can cause fatigue and should not be taken while at work or driving.  Back pain is very common.  The pain often gets better over time.  The cause of back pain is usually not dangerous.  Most people can learn to manage their back pain on their own.  Home Care  Stay active.  Start with short walks on flat ground if you can.  Try to walk farther each day.  Do not sit, drive or stand in one place for more than 30 minutes.  Do not stay in bed.  Do not avoid exercise or work.  Activity can help your back heal faster.  Be careful when you bend or lift an object.  Bend at your knees, keep the object close to you, and do not twist.  Sleep on a firm mattress.  Lie on your side, and bend your knees.  If you lie on your back, put a pillow under your knees.  Only take medicines as told by your doctor.  Put ice on the injured area.  Put ice in a plastic bag  Place a towel between your skin and the bag  Leave the ice on for 15-20 minutes, 3-4 times a day for the first 2-3 days.  After that, you can switch between ice and heat packs.  Ask your doctor about back exercises or massage.  Avoid feeling anxious or stressed.  Find good ways to deal with stress, such as exercise.  Get Help Right Way If:  Your pain does not go away with rest or medicine.  Your pain does not go away in 1 week.  You have new problems.  You do not  feel well.  The pain spreads into your legs.  You cannot control when you poop (bowel movement) or pee (urinate)  You feel sick to your stomach (nauseous) or throw up (vomit)  You have belly (abdominal) pain.  You feel like you may pass out (faint).  If you develop a fever.  Make Sure you:  Understand these instructions.  Will watch your condition  Will get help right away if you are not doing well or get worse.  Your e-visit answers were reviewed by a board certified advanced clinical practitioner to complete your personal care plan.  Depending on the condition, your plan could have included both over the counter or prescription medications.  If there is a problem please reply  once you have received a response from your provider.  Your safety is important to [place].  If you have drug allergies check your prescription carefully.    You can use MyChart to ask questions about today's visit, request a non-urgent call back, or ask for a work or school excuse.  You will get an e-mail in the next two days asking about your experience.  I hope that your e-visit has been valuable and will speed your recovery. Thank you  for using e-visits.

## 2015-03-21 NOTE — Telephone Encounter (Signed)
Pt called for hydrocodone. Most days taking 2-3/day. She has 3 days left. Please call when ready [phone].

## 2015-03-21 NOTE — Telephone Encounter (Signed)
Last filled: 09/12/14 Amt: 90,1 Last OV:  01/26/15  Med filled.

## 2015-03-22 NOTE — Telephone Encounter (Signed)
Ok to RF hydrocodone, same sig, same number

## 2015-03-23 MED ORDER — HYDROCODONE-ACETAMINOPHEN 10-325 MG PO TABS: 1.0000 | ORAL_TABLET | Freq: Three times a day (TID) | ORAL | Status: DC | PRN

## 2015-03-23 NOTE — Telephone Encounter (Signed)
Printed and on counter for signature. 

## 2015-03-23 NOTE — Telephone Encounter (Signed)
Called the patient informed hardcopy for hydrocodone is ready for pickup at the front desk. (HAD TO LEAVE A DETAILED MESSAGE).

## 2015-04-03 ENCOUNTER — Telehealth: Payer: 59 | Admitting: Nurse Practitioner

## 2015-04-03 DIAGNOSIS — J0100 Acute maxillary sinusitis, unspecified: Secondary | ICD-10-CM

## 2015-04-03 MED ORDER — AMOXICILLIN-POT CLAVULANATE 875-125 MG PO TABS: 1.0000 | ORAL_TABLET | Freq: Two times a day (BID) | ORAL | Status: DC

## 2015-04-03 NOTE — Progress Notes (Signed)

## 2015-04-12 ENCOUNTER — Ambulatory Visit: Payer: 59 | Admitting: Family Medicine

## 2015-04-16 ENCOUNTER — Encounter: Payer: Self-pay | Admitting: Family Medicine

## 2015-04-16 ENCOUNTER — Ambulatory Visit (INDEPENDENT_AMBULATORY_CARE_PROVIDER_SITE_OTHER): Payer: 59 | Admitting: Family Medicine

## 2015-04-16 VITALS — BP 122/74 | HR 75 | Temp 97.9°F | Ht 66.0 in | Wt 159.2 lb

## 2015-04-16 DIAGNOSIS — G43809 Other migraine, not intractable, without status migrainosus: Secondary | ICD-10-CM

## 2015-04-16 DIAGNOSIS — R519 Headache, unspecified: Secondary | ICD-10-CM

## 2015-04-16 DIAGNOSIS — R51 Headache: Secondary | ICD-10-CM

## 2015-04-16 DIAGNOSIS — R739 Hyperglycemia, unspecified: Secondary | ICD-10-CM

## 2015-04-16 DIAGNOSIS — G4700 Insomnia, unspecified: Secondary | ICD-10-CM

## 2015-04-16 DIAGNOSIS — M25579 Pain in unspecified ankle and joints of unspecified foot: Secondary | ICD-10-CM | POA: Diagnosis not present

## 2015-04-16 DIAGNOSIS — E782 Mixed hyperlipidemia: Secondary | ICD-10-CM

## 2015-04-16 DIAGNOSIS — E538 Deficiency of other specified B group vitamins: Secondary | ICD-10-CM

## 2015-04-16 DIAGNOSIS — I10 Essential (primary) hypertension: Secondary | ICD-10-CM

## 2015-04-16 DIAGNOSIS — J019 Acute sinusitis, unspecified: Secondary | ICD-10-CM

## 2015-04-16 HISTORY — DX: Headache, unspecified: R51.9

## 2015-04-16 LAB — COMPREHENSIVE METABOLIC PANEL
ALBUMIN: 4.3 g/dL (ref 3.5–5.2)
ALT: 15 U/L (ref 0–35)
AST: 17 U/L (ref 0–37)
Alkaline Phosphatase: 96 U/L (ref 39–117)
BUN: 7 mg/dL (ref 6–23)
CALCIUM: 9.9 mg/dL (ref 8.4–10.5)
CHLORIDE: 102 meq/L (ref 96–112)
CO2: 29 meq/L (ref 19–32)
Creatinine, Ser: 0.66 mg/dL (ref 0.40–1.20)
GFR: 95.6 mL/min (ref >60.00)
Glucose, Bld: 95 mg/dL (ref 70–99)
POTASSIUM: 4 meq/L (ref 3.5–5.1)
SODIUM: 140 meq/L (ref 135–145)
Total Bilirubin: 0.4 mg/dL (ref 0.2–1.2)
Total Protein: 7.7 g/dL (ref 6.0–8.3)

## 2015-04-16 LAB — CBC
HEMATOCRIT: 42.6 % (ref 36.0–46.0)
Hemoglobin: 14.3 g/dL (ref 12.0–15.0)
MCHC: 33.5 g/dL (ref 30.0–36.0)
MCV: 93.1 fl (ref 78.0–100.0)
Platelets: 307 10*3/uL (ref 150.0–400.0)
RBC: 4.57 Mil/uL (ref 3.87–5.11)
RDW: 12.6 % (ref 11.5–15.5)
WBC: 7.6 10*3/uL (ref 4.0–10.5)

## 2015-04-16 LAB — SEDIMENTATION RATE: SED RATE: 6 mm/h (ref 0–22)

## 2015-04-16 LAB — LIPID PANEL
CHOLESTEROL: 215 mg/dL — AB (ref 0–200)
HDL: 45.7 mg/dL (ref >39.00)
LDL Cholesterol: 142 mg/dL — ABNORMAL HIGH (ref 0–99)
NonHDL: 168.98
Total CHOL/HDL Ratio: 5
Triglycerides: 134 mg/dL (ref 0.0–149.0)
VLDL: 26.8 mg/dL (ref 0.0–40.0)

## 2015-04-16 LAB — HEMOGLOBIN A1C: Hgb A1c MFr Bld: 5.7 % (ref 4.6–6.5)

## 2015-04-16 LAB — TSH: TSH: 1.48 u[IU]/mL (ref 0.35–4.50)

## 2015-04-16 LAB — VITAMIN B12

## 2015-04-16 MED ORDER — HYDROCODONE-ACETAMINOPHEN 10-325 MG PO TABS: 1.0000 | ORAL_TABLET | Freq: Three times a day (TID) | ORAL | Status: DC | PRN

## 2015-04-16 MED ORDER — CEFDINIR 300 MG PO CAPS: 300.0000 mg | ORAL_CAPSULE | Freq: Two times a day (BID) | ORAL | Status: DC

## 2015-04-16 NOTE — Progress Notes (Signed)
Patient ID: Megan [name], female   DOB: 22-Jul-1951, 64 y.o.   MRN: [id number]   Subjective:    Patient ID: Megan [name], female    DOB: December 07, 1950, 64 y.o.   MRN: [id number]  Chief Complaint  Patient presents with  . Follow-up    HPI Patient is in today for Follow-up. Is having worsening and more frequent headache. Denies congestion, fevers or signs of acute illness. No other neurologic complaints. Denies CP/palp/SOB/congestion/fevers/GI or GU c/o. Taking meds as prescribed  Past Medical History  Diagnosis Date  . GERD (gastroesophageal reflux disease)   . Depression   . Anxiety   . Insomnia   . IBS (irritable bowel syndrome)   . Diverticulosis   . Barrett esophagus   . Hiatal hernia   . Unspecified menopausal and postmenopausal disorder   . Vitamin B12 deficiency   . Blind loop syndrome   . Hepatic cyst   . Unspecified hypothyroidism   . Diarrhea   . Family history of malignant neoplasm of gastrointestinal tract     multiple members  . Endometrial hyperplasia 02/27/2006    BENIGN ENDO BX ON 02/2007  . C. difficile diarrhea   . Cystocele, midline   . Rectocele   . Uterine prolapse without mention of vaginal wall prolapse   . Chest pain, atypical 07/14/2011  . Leaky heart valve   . Hiatal hernia   . 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 Thrush 07/21/2011  . Rectal bleeding 10/10/2011  . Sinusitis acute 02/03/2012  . Contact dermatitis 03/23/2012  . Headache(784.0) 04/15/2012  . 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 Hematuria 04/27/2012  . Allergic state 12/31/2012  . 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 Paronychia of great toe, left 06/19/2013  . Low back pain 08/15/2013  . Osteoporosis 05/2011    T score-2.5 Lt femoral neck 2012, T score -2.4 2016 FRAX 12%/2.4%  . 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 Hypertension   . Asthma   . 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 Preventative health care 09/11/2014  . Hyperlipidemia, mixed 12/17/2014  . Colon polyp     Past Surgical History    Procedure Laterality Date  . Appendectomy    . Uterine fibroid surgery    . Hysteroscopy      POLYP    Family History  Problem Relation Age of Onset  . Colon cancer Mother   . Diabetes Mother   . Hypertension Mother   . Cancer Mother     colon  . Colon polyps Father   . Parkinson's disease Father   . Colon cancer Maternal Aunt   . Colon cancer Maternal Grandmother   . Breast cancer Maternal Grandmother 29  . Diabetes Paternal Grandmother   . Breast cancer Paternal Grandmother 63  . Colon cancer Cousin     X3  . Ovarian cancer Cousin   . Allergies Sister   . Arthritis Brother     b/l hip replace  . Alcohol abuse Daughter   . Arthritis Son   . Hypertension Son   . Allergies Son   . Osteoporosis Son   . Osteoporosis Son   . Arthritis Son     back disease, DDD  . Allergies Son   . Arthritis Son   . Other Son     RSD, IBS  . Hypertension Paternal Grandfather   . Heart disease Paternal Grandfather     aneurysm    Social History   Social History  . Marital Status: Married    Spouse  Name: N/A  . Number of Children: 4  . Years of Education: N/A   Occupational History  . Retired    Social History Main Topics  . Smoking status: Former Smoker    Quit date: 08/19/1995  . Smokeless tobacco: Never Used  . Alcohol Use: No  . Drug Use: No  . Sexual Activity: Not Currently    Birth Control/ Protection: Post-menopausal     Comment: 1st intercourse 79 yo-5 partners   Other Topics Concern  . Not on file   Social History Narrative    Outpatient Prescriptions Prior to Visit  Medication Sig Dispense Refill  . alprazolam (XANAX) 2 MG tablet Take 1 tablet (2 mg total) by mouth 2 (two) times daily as needed. 60 tablet 1  . amoxicillin-clavulanate (AUGMENTIN) 875-125 MG per tablet Take 1 tablet by mouth 2 (two) times daily. 20 tablet 0  . ampicillin (PRINCIPEN) 500 MG capsule Take 1 capsule (500 mg total) by mouth 4 (four) times daily. 20 capsule 0  . carvedilol  (COREG) 12.5 MG tablet Take 1 tablet (12.5 mg total) by mouth 2 (two) times daily. 180 tablet 3  . cyclobenzaprine (FLEXERIL) 10 MG tablet Take 1 tablet (10 mg total) by mouth 3 (three) times daily as needed for muscle spasms. 40 tablet 1  . dicyclomine (BENTYL) 10 MG capsule Take 1 capsule (10 mg total) by mouth 3 (three) times daily before meals. 30 capsule 0  . diltiazem (CARTIA XT) 180 MG 24 hr capsule Take 1 capsule (180 mg total) by mouth daily. 90 capsule 1  . DULoxetine (CYMBALTA) 30 MG capsule TAKE 1 CAPSULE(30 MG) BY MOUTH DAILY 90 capsule 1  . fluticasone (FLONASE) 50 MCG/ACT nasal spray USE 2 SPRAYS IN EACH NOSTRIL EVERY DAY 16 g 2  . HYDROcodone-acetaminophen (NORCO) 10-325 MG per tablet Take 1 tablet by mouth every 8 (eight) hours as needed. 65 tablet 0  . hydrocortisone (ANUCORT-HC) 25 MG suppository INSERT 1 SUPPOSITORY RECTALLY AT BEDTIME AS NEEDED FOR HEMORRHOIDS. X7DAYS THEN AS NEEDED 20 suppository 1  . hyoscyamine (LEVBID) 0.375 MG 12 hr tablet TAKE 2 TABLETS BY MOUTH TWICE DAILY AS NEEDED 360 tablet 2  . lactobacillus acidophilus (BACID) TABS tablet Take 2 tablets by mouth 3 (three) times daily.    . Linaclotide (LINZESS) 145 MCG CAPS capsule Take 145 mcg by mouth daily.    . naproxen (NAPROSYN) 500 MG tablet Take 1 tablet (500 mg total) by mouth 2 (two) times daily with a meal. 30 tablet 0  . omeprazole (PRILOSEC) 40 MG capsule Take 1 capsule by mouth 2 times daily before meals.  Take 30 min before breakfast and dinner.    . ondansetron (ZOFRAN-ODT) 4 MG disintegrating tablet Take 1 tablet (4 mg total) by mouth every 8 (eight) hours as needed for nausea or vomiting. 30 tablet 0  . promethazine (PHENERGAN) 25 MG tablet TAKE 1 TABLET BY MOUTH THREE TIMES DAILY AS NEEDED FOR NAUSEA 30 tablet 0  . ranitidine (ZANTAC) 300 MG tablet TAKE 1 TABLET BY MOUTH AT BEDTIME. 90 tablet 1  . rifaximin (XIFAXAN) 550 MG TABS tablet Take 550 mg by mouth 2 (two) times daily.    . VENTOLIN HFA 108  (90 BASE) MCG/ACT inhaler INHALE 2 PUFFS BY MOUTH EVERY 6 HOURS AS NEEDED 18 each 1  . zolpidem (AMBIEN) 10 MG tablet Take 1 tablet (10 mg total) by mouth at bedtime as needed for sleep. 15 tablet 2   No facility-administered medications prior to visit.  Allergies  Allergen Reactions  . Sulfamethoxazole Shortness Of Breath    chest tightness  . Iohexol   . Iodinated Diagnostic Agents     Pt states at Dr.Grapeys office 15 years ago blacked out for 2 hours during injection for IVP. Was told never to have IV dye again.     Review of Systems  Constitutional: Positive for malaise/fatigue. Negative for fever.  HENT: Positive for congestion.   Eyes: Negative for discharge.  Respiratory: Negative for shortness of breath.   Cardiovascular: Negative for chest pain, palpitations and leg swelling.  Gastrointestinal: Positive for nausea. Negative for abdominal pain.  Genitourinary: Negative for dysuria.  Musculoskeletal: Negative for falls.  Skin: Negative for rash.  Neurological: Positive for headaches. Negative for loss of consciousness.  Endo/Heme/Allergies: Negative for environmental allergies.  Psychiatric/Behavioral: Negative for depression. The patient is not nervous/anxious.        Objective:    Physical Exam  Constitutional: She is oriented to person, place, and time. She appears well-developed and well-nourished. No distress.  HENT:  Head: Normocephalic and atraumatic.  Nose: Nose normal.  TMs dull b/l with erythema, nares patent but erythematous and boggy  Eyes: Right eye exhibits no discharge. Left eye exhibits no discharge.  Neck: Normal range of motion. Neck supple.  Cardiovascular: Normal rate and regular rhythm.   No murmur heard. Pulmonary/Chest: Effort normal and breath sounds normal.  Abdominal: Soft. Bowel sounds are normal. There is no tenderness.  Musculoskeletal: She exhibits no edema.  Neurological: She is alert and oriented to person, place, and time.    Skin: Skin is warm and dry.  Psychiatric: She has a normal mood and affect.  Nursing note and vitals reviewed.   BP 122/74 mmHg  Pulse 75  Temp(Src) 97.9 F (36.6 C) (Oral)  Ht 5\' 6"  (1.676 m)  Wt 159 lb 4 oz (72.235 kg)  BMI 25.72 kg/m2  SpO2 96% Wt Readings from Last 3 Encounters:  04/16/15 159 lb 4 oz (72.235 kg)  02/05/15 161 lb (73.029 kg)  01/26/15 162 lb (73.483 kg)     Lab Results  Component Value Date   WBC 6.8 12/12/2014   HGB 14.0 12/12/2014   HCT 41.0 12/12/2014   PLT 306.0 12/12/2014   GLUCOSE 103* 12/12/2014   CHOL 180 12/12/2014   TRIG 129.0 12/12/2014   HDL 47.00 12/12/2014   LDLDIRECT 165.0 06/30/2011   LDLCALC 107* 12/12/2014   ALT 20 12/12/2014   AST 19 12/12/2014   NA 140 12/12/2014   K 4.6 12/12/2014   CL 104 12/12/2014   CREATININE 0.69 12/12/2014   BUN 9 12/12/2014   CO2 24 12/12/2014   TSH 1.29 12/12/2014   HGBA1C 5.6 12/12/2014    Lab Results  Component Value Date   TSH 1.29 12/12/2014   Lab Results  Component Value Date   WBC 6.8 12/12/2014   HGB 14.0 12/12/2014   HCT 41.0 12/12/2014   MCV 92.2 12/12/2014   PLT 306.0 12/12/2014   Lab Results  Component Value Date   NA 140 12/12/2014   K 4.6 12/12/2014   CO2 24 12/12/2014   GLUCOSE 103* 12/12/2014   BUN 9 12/12/2014   CREATININE 0.69 12/12/2014   BILITOT 0.4 12/12/2014   ALKPHOS 92 12/12/2014   AST 19 12/12/2014   ALT 20 12/12/2014   PROT 7.3 12/12/2014   ALBUMIN 4.4 12/12/2014   CALCIUM 9.7 12/12/2014   ANIONGAP 8 12/05/2014   GFR 90.92 12/12/2014   Lab Results  Component  Value Date   CHOL 180 12/12/2014   Lab Results  Component Value Date   HDL 47.00 12/12/2014   Lab Results  Component Value Date   LDLCALC 107* 12/12/2014   Lab Results  Component Value Date   TRIG 129.0 12/12/2014   Lab Results  Component Value Date   CHOLHDL 4 12/12/2014   Lab Results  Component Value Date   HGBA1C 5.6 12/12/2014       Assessment & Plan:   Essential  hypertension Well controlled, no changes to meds. Encouraged heart healthy diet such as the DASH diet and exercise as tolerated.   Hyperglycemia hgba1c acceptable, minimize simple carbs. Increase exercise as tolerated.   Hyperlipidemia, mixed Encouraged heart healthy diet, increase exercise, avoid trans fats, consider a krill oil cap daily  Migraine Encouraged increased hydration, 64 ounces of clear fluids daily. Minimize alcohol and caffeine. Eat small frequent meals with lean proteins and complex carbs. Avoid high and low blood sugars. Get adequate sleep, 7-8 hours a night. Needs exercise daily preferably in the morning. Left sided headache worse this month. Has been under a great deal of stress with a sick dog and new great grand baby  Sinusitis Worsening symptoms for past 2 weeks with thick green sputum, was treated with Augmentin for 5 days but then developed uncontrollable diarrhea, so stopped. Start new antibiotic, continue mucinex  Insomnia Encouraged good sleep hygiene such as dark, quiet room. No blue/green glowing lights such as computer screens in bedroom. No alcohol or stimulants in evening. Cut down on caffeine as able. Regular exercise is helpful but not just prior to bed time. Melatonin as needed  Headache Encouraged increased hydration, 64 ounces of clear fluids daily. Minimize alcohol and caffeine. Eat small frequent meals with lean proteins and complex carbs. Avoid high and low blood sugars. Get adequate sleep, 7-8 hours a night. Needs exercise daily preferably in the morning. Worsening, referred to neurology for further consideration   I am having Ms. Seybold maintain her lactobacillus acidophilus, hyoscyamine, omeprazole, zolpidem, fluticasone, dicyclomine, VENTOLIN HFA, carvedilol, Linaclotide, rifaximin, ondansetron, diltiazem, ampicillin, hydrocortisone, DULoxetine, promethazine, alprazolam, ranitidine, naproxen, cyclobenzaprine, HYDROcodone-acetaminophen, and  amoxicillin-clavulanate.  No orders of the defined types were placed in this encounter.     Elizabeth Sauer, LPN

## 2015-04-16 NOTE — Assessment & Plan Note (Signed)
hgba1c acceptable, minimize simple carbs. Increase exercise as tolerated.  

## 2015-04-16 NOTE — Assessment & Plan Note (Signed)
Well controlled, no changes to meds. Encouraged heart healthy diet such as the DASH diet and exercise as tolerated.  °

## 2015-04-16 NOTE — Assessment & Plan Note (Signed)
Encouraged increased hydration, 64 ounces of clear fluids daily. Minimize alcohol and caffeine. Eat small frequent meals with lean proteins and complex carbs. Avoid high and low blood sugars. Get adequate sleep, 7-8 hours a night. Needs exercise daily preferably in the morning. Left sided headache worse this month. Has been under a great deal of stress with a sick dog and new great grand baby

## 2015-04-16 NOTE — Assessment & Plan Note (Signed)
Encouraged heart healthy diet, increase exercise, avoid trans fats, consider a krill oil cap daily 

## 2015-04-16 NOTE — Patient Instructions (Addendum)
Encouraged increased hydration, 64 ounces of clear fluids daily. Minimize alcohol and caffeine. Eat small frequent meals with lean proteins and complex carbs. Avoid high and low blood sugars. Get adequate sleep, 7-8 hours a night. Needs exercise daily preferably in the morning.  L typrtophan or Melatonin up to 10 mg. Encouraged good sleep hygiene such as dark, quiet room. No blue/green glowing lights such as computer screens in bedroom. No alcohol or stimulants in evening. Cut down on caffeine as able. Regular exercise is helpful but not just prior to bed time. Hyland's calms Fort[place] for sleep  Sinusitis Sinusitis is redness, soreness, and inflammation of the paranasal sinuses. Paranasal sinuses are air pockets within the bones of your face (beneath the eyes, the middle of the forehead, or above the eyes). In healthy paranasal sinuses, mucus is able to drain out, and air is able to circulate through them by way of your nose. However, when your paranasal sinuses are inflamed, mucus and air can become trapped. This can allow bacteria and other germs to grow and cause infection. Sinusitis can develop quickly and last only a short time (acute) or continue over a long period (chronic). Sinusitis that lasts for more than 12 weeks is considered chronic.  CAUSES  Causes of sinusitis include:  Allergies.  Structural abnormalities, such as displacement of the cartilage that separates your nostrils (deviated septum), which can decrease the air flow through your nose and sinuses and affect sinus drainage.  Functional abnormalities, such as when the small hairs (cilia) that line your sinuses and help remove mucus do not work properly or are not present. SIGNS AND SYMPTOMS  Symptoms of acute and chronic sinusitis are the same. The primary symptoms are pain and pressure around the affected sinuses. Other symptoms include:  Upper toothache.  Earache.  Headache.  Bad breath.  Decreased sense of smell and  taste.  A cough, which worsens when you are lying flat.  Fatigue.  Fever.  Thick drainage from your nose, which often is green and may contain pus (purulent).  Swelling and warmth over the affected sinuses. DIAGNOSIS  Your health care provider will perform a physical exam. During the exam, your health care provider may:  Look in your nose for signs of abnormal growths in your nostrils (nasal polyps).  Tap over the affected sinus to check for signs of infection.  View the inside of your sinuses (endoscopy) using an imaging device that has a light attached (endoscope). If your health care provider suspects that you have chronic sinusitis, one or more of the following tests may be recommended:  Allergy tests.  Nasal culture. A sample of mucus is taken from your nose, sent to a lab, and screened for bacteria.  Nasal cytology. A sample of mucus is taken from your nose and examined by your health care provider to determine if your sinusitis is related to an allergy. TREATMENT  Most cases of acute sinusitis are related to a viral infection and will resolve on their own within 10 days. Sometimes medicines are prescribed to help relieve symptoms (pain medicine, decongestants, nasal steroid sprays, or saline sprays).  However, for sinusitis related to a bacterial infection, your health care provider will prescribe antibiotic medicines. These are medicines that will help kill the bacteria causing the infection.  Rarely, sinusitis is caused by a fungal infection. In theses cases, your health care provider will prescribe antifungal medicine. For some cases of chronic sinusitis, surgery is needed. Generally, these are cases in which sinusitis recurs  more than 3 times per year, despite other treatments. HOME CARE INSTRUCTIONS   Drink plenty of water. Water helps thin the mucus so your sinuses can drain more easily.  Use a humidifier.  Inhale steam 3 to 4 times a day (for example, sit in the  bathroom with the shower running).  Apply a warm, moist washcloth to your face 3 to 4 times a day, or as directed by your health care provider.  Use saline nasal sprays to help moisten and clean your sinuses.  Take medicines only as directed by your health care provider.  If you were prescribed either an antibiotic or antifungal medicine, finish it all even if you start to feel better. SEEK IMMEDIATE MEDICAL CARE IF:  You have increasing pain or severe headaches.  You have nausea, vomiting, or drowsiness.  You have swelling around your face.  You have vision problems.  You have a stiff neck.  You have difficulty breathing. MAKE SURE YOU:   Understand these instructions.  Will watch your condition.  Will get help right away if you are not doing well or get worse. Document Released: 08/04/2005 Document Revised: 12/19/2013 Document Reviewed: 08/19/2011 [facility] Patient Information 2015 [place], [place]. This information is not intended to replace advice given to you by your health care provider. Make sure you discuss any questions you have with your health care provider.

## 2015-04-16 NOTE — Assessment & Plan Note (Signed)
Worsening symptoms for past 2 weeks with thick green sputum, was treated with Augmentin for 5 days but then developed uncontrollable diarrhea, so stopped. Start new antibiotic, continue mucinex

## 2015-04-16 NOTE — Progress Notes (Deleted)
Patient ID: Megan [name], female   DOB: 02-13-1951, 64 y.o.   MRN: [id number]   Subjective:    Patient ID: Megan [name], female    DOB: November 29, 1950, 64 y.o.   MRN: [id number]  No chief complaint on file.   HPI Patient is in today for ***  Past Medical History  Diagnosis Date  . GERD (gastroesophageal reflux disease)   . Depression   . Anxiety   . Insomnia   . IBS (irritable bowel syndrome)   . Diverticulosis   . Barrett esophagus   . Hiatal hernia   . Unspecified menopausal and postmenopausal disorder   . Vitamin B12 deficiency   . Blind loop syndrome   . Hepatic cyst   . Unspecified hypothyroidism   . Diarrhea   . Family history of malignant neoplasm of gastrointestinal tract     multiple members  . Endometrial hyperplasia 02/27/2006    BENIGN ENDO BX ON 02/2007  . C. difficile diarrhea   . Cystocele, midline   . Rectocele   . Uterine prolapse without mention of vaginal wall prolapse   . Chest pain, atypical 07/14/2011  . Leaky heart valve   . Hiatal hernia   . 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 Thrush 07/21/2011  . Rectal bleeding 10/10/2011  . Sinusitis acute 02/03/2012  . Contact dermatitis 03/23/2012  . Headache(784.0) 04/15/2012  . 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 Hematuria 04/27/2012  . Allergic state 12/31/2012  . 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 Paronychia of great toe, left 06/19/2013  . Low back pain 08/15/2013  . Osteoporosis 05/2011    T score-2.5 Lt femoral neck 2012, T score -2.4 2016 FRAX 12%/2.4%  . 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 Hypertension   . Asthma   . 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 Preventative health care 09/11/2014  . Hyperlipidemia, mixed 12/17/2014  . Colon polyp     Past Surgical History  Procedure Laterality Date  . Appendectomy    . Uterine fibroid surgery    . Hysteroscopy      POLYP    Family History  Problem Relation Age of Onset  . Colon cancer Mother   . Diabetes Mother   . Hypertension Mother   . Cancer  Mother     colon  . Colon polyps Father   . Parkinson's disease Father   . Colon cancer Maternal Aunt   . Colon cancer Maternal Grandmother   . Breast cancer Maternal Grandmother 32  . Diabetes Paternal Grandmother   . Breast cancer Paternal Grandmother 64  . Colon cancer Cousin     X3  . Ovarian cancer Cousin   . Allergies Sister   . Arthritis Brother     b/l hip replace  . Alcohol abuse Daughter   . Arthritis Son   . Hypertension Son   . Allergies Son   . Osteoporosis Son   . Osteoporosis Son   . Arthritis Son     back disease, DDD  . Allergies Son   . Arthritis Son   . Other Son     RSD, IBS  . Hypertension Paternal Grandfather   . Heart disease Paternal Grandfather     aneurysm    Social History   Social History  . Marital Status: Married    Spouse Name: N/A  . Number of Children: 4  . Years of Education: N/A   Occupational History  . Retired    Social History Main Topics  . Smoking status: Former Smoker  Quit date: 08/19/1995  . Smokeless tobacco: Never Used  . Alcohol Use: No  . Drug Use: No  . Sexual Activity: Not Currently    Birth Control/ Protection: Post-menopausal     Comment: 1st intercourse 35 yo-5 partners   Other Topics Concern  . Not on file   Social History Narrative    Outpatient Prescriptions Prior to Visit  Medication Sig Dispense Refill  . alprazolam (XANAX) 2 MG tablet Take 1 tablet (2 mg total) by mouth 2 (two) times daily as needed. 60 tablet 1  . amoxicillin-clavulanate (AUGMENTIN) 875-125 MG per tablet Take 1 tablet by mouth 2 (two) times daily. 20 tablet 0  . ampicillin (PRINCIPEN) 500 MG capsule Take 1 capsule (500 mg total) by mouth 4 (four) times daily. 20 capsule 0  . carvedilol (COREG) 12.5 MG tablet Take 1 tablet (12.5 mg total) by mouth 2 (two) times daily. 180 tablet 3  . cyclobenzaprine (FLEXERIL) 10 MG tablet Take 1 tablet (10 mg total) by mouth 3 (three) times daily as needed for muscle spasms. 40 tablet 1  .  dicyclomine (BENTYL) 10 MG capsule Take 1 capsule (10 mg total) by mouth 3 (three) times daily before meals. 30 capsule 0  . diltiazem (CARTIA XT) 180 MG 24 hr capsule Take 1 capsule (180 mg total) by mouth daily. 90 capsule 1  . DULoxetine (CYMBALTA) 30 MG capsule TAKE 1 CAPSULE(30 MG) BY MOUTH DAILY 90 capsule 1  . fluticasone (FLONASE) 50 MCG/ACT nasal spray USE 2 SPRAYS IN EACH NOSTRIL EVERY DAY 16 g 2  . HYDROcodone-acetaminophen (NORCO) 10-325 MG per tablet Take 1 tablet by mouth every 8 (eight) hours as needed. 65 tablet 0  . hydrocortisone (ANUCORT-HC) 25 MG suppository INSERT 1 SUPPOSITORY RECTALLY AT BEDTIME AS NEEDED FOR HEMORRHOIDS. X7DAYS THEN AS NEEDED 20 suppository 1  . hyoscyamine (LEVBID) 0.375 MG 12 hr tablet TAKE 2 TABLETS BY MOUTH TWICE DAILY AS NEEDED 360 tablet 2  . lactobacillus acidophilus (BACID) TABS tablet Take 2 tablets by mouth 3 (three) times daily.    . Linaclotide (LINZESS) 145 MCG CAPS capsule Take 145 mcg by mouth daily.    . naproxen (NAPROSYN) 500 MG tablet Take 1 tablet (500 mg total) by mouth 2 (two) times daily with a meal. 30 tablet 0  . omeprazole (PRILOSEC) 40 MG capsule Take 1 capsule by mouth 2 times daily before meals.  Take 30 min before breakfast and dinner.    . ondansetron (ZOFRAN-ODT) 4 MG disintegrating tablet Take 1 tablet (4 mg total) by mouth every 8 (eight) hours as needed for nausea or vomiting. 30 tablet 0  . promethazine (PHENERGAN) 25 MG tablet TAKE 1 TABLET BY MOUTH THREE TIMES DAILY AS NEEDED FOR NAUSEA 30 tablet 0  . ranitidine (ZANTAC) 300 MG tablet TAKE 1 TABLET BY MOUTH AT BEDTIME. 90 tablet 1  . rifaximin (XIFAXAN) 550 MG TABS tablet Take 550 mg by mouth 2 (two) times daily.    . VENTOLIN HFA 108 (90 BASE) MCG/ACT inhaler INHALE 2 PUFFS BY MOUTH EVERY 6 HOURS AS NEEDED 18 each 1  . zolpidem (AMBIEN) 10 MG tablet Take 1 tablet (10 mg total) by mouth at bedtime as needed for sleep. 15 tablet 2   No facility-administered medications  prior to visit.    Allergies  Allergen Reactions  . Sulfamethoxazole Shortness Of Breath    chest tightness  . Iohexol   . Iodinated Diagnostic Agents     Pt states at Dr.Grapeys office 15  years ago blacked out for 2 hours during injection for IVP. Was told never to have IV dye again.     Review of Systems  Constitutional: Negative for fever and malaise/fatigue.  HENT: Negative for congestion.   Eyes: Negative for discharge.  Respiratory: Negative for shortness of breath.   Cardiovascular: Negative for chest pain, palpitations and leg swelling.  Gastrointestinal: Negative for nausea and abdominal pain.  Genitourinary: Negative for dysuria.  Musculoskeletal: Negative for falls.  Skin: Negative for rash.  Neurological: Negative for loss of consciousness and headaches.  Endo/Heme/Allergies: Negative for environmental allergies.  Psychiatric/Behavioral: Negative for depression. The patient is not nervous/anxious.        Objective:    Physical Exam  There were no vitals taken for this visit. Wt Readings from Last 3 Encounters:  02/05/15 161 lb (73.029 kg)  01/26/15 162 lb (73.483 kg)  12/12/14 162 lb (73.483 kg)     Lab Results  Component Value Date   WBC 6.8 12/12/2014   HGB 14.0 12/12/2014   HCT 41.0 12/12/2014   PLT 306.0 12/12/2014   GLUCOSE 103* 12/12/2014   CHOL 180 12/12/2014   TRIG 129.0 12/12/2014   HDL 47.00 12/12/2014   LDLDIRECT 165.0 06/30/2011   LDLCALC 107* 12/12/2014   ALT 20 12/12/2014   AST 19 12/12/2014   NA 140 12/12/2014   K 4.6 12/12/2014   CL 104 12/12/2014   CREATININE 0.69 12/12/2014   BUN 9 12/12/2014   CO2 24 12/12/2014   TSH 1.29 12/12/2014   HGBA1C 5.6 12/12/2014    Lab Results  Component Value Date   TSH 1.29 12/12/2014   Lab Results  Component Value Date   WBC 6.8 12/12/2014   HGB 14.0 12/12/2014   HCT 41.0 12/12/2014   MCV 92.2 12/12/2014   PLT 306.0 12/12/2014   Lab Results  Component Value Date   NA 140  12/12/2014   K 4.6 12/12/2014   CO2 24 12/12/2014   GLUCOSE 103* 12/12/2014   BUN 9 12/12/2014   CREATININE 0.69 12/12/2014   BILITOT 0.4 12/12/2014   ALKPHOS 92 12/12/2014   AST 19 12/12/2014   ALT 20 12/12/2014   PROT 7.3 12/12/2014   ALBUMIN 4.4 12/12/2014   CALCIUM 9.7 12/12/2014   ANIONGAP 8 12/05/2014   GFR 90.92 12/12/2014   Lab Results  Component Value Date   CHOL 180 12/12/2014   Lab Results  Component Value Date   HDL 47.00 12/12/2014   Lab Results  Component Value Date   LDLCALC 107* 12/12/2014   Lab Results  Component Value Date   TRIG 129.0 12/12/2014   Lab Results  Component Value Date   CHOLHDL 4 12/12/2014   Lab Results  Component Value Date   HGBA1C 5.6 12/12/2014       Assessment & Plan:   Problem List Items Addressed This Visit    None      I am having Ms. Raso maintain her lactobacillus acidophilus, hyoscyamine, omeprazole, zolpidem, fluticasone, dicyclomine, VENTOLIN HFA, carvedilol, Linaclotide, rifaximin, ondansetron, diltiazem, ampicillin, hydrocortisone, DULoxetine, promethazine, alprazolam, ranitidine, naproxen, cyclobenzaprine, HYDROcodone-acetaminophen, and amoxicillin-clavulanate.  No orders of the defined types were placed in this encounter.     Elizabeth Sauer, LPN

## 2015-04-16 NOTE — Progress Notes (Deleted)
Pre visit review using our clinic review tool, if applicable. No additional management support is needed unless otherwise documented below in the visit note. 

## 2015-04-16 NOTE — Progress Notes (Signed)
Pre visit review using our clinic review tool, if applicable. No additional management support is needed unless otherwise documented below in the visit note. 

## 2015-04-16 NOTE — Assessment & Plan Note (Signed)
Encouraged good sleep hygiene such as dark, quiet room. No blue/green glowing lights such as computer screens in bedroom. No alcohol or stimulants in evening. Cut down on caffeine as able. Regular exercise is helpful but not just prior to bed time. Melatonin as needed

## 2015-04-26 ENCOUNTER — Encounter: Payer: Self-pay | Admitting: Neurology

## 2015-04-26 ENCOUNTER — Ambulatory Visit (INDEPENDENT_AMBULATORY_CARE_PROVIDER_SITE_OTHER): Payer: 59 | Admitting: Neurology

## 2015-04-26 VITALS — BP 118/64 | HR 77 | Temp 98.4°F | Resp 16 | Ht 64.96 in | Wt 162.4 lb

## 2015-04-26 DIAGNOSIS — Z8489 Family history of other specified conditions: Secondary | ICD-10-CM | POA: Diagnosis not present

## 2015-04-26 DIAGNOSIS — R51 Headache: Secondary | ICD-10-CM | POA: Diagnosis not present

## 2015-04-26 DIAGNOSIS — Z8249 Family history of ischemic heart disease and other diseases of the circulatory system: Secondary | ICD-10-CM

## 2015-04-26 DIAGNOSIS — R519 Headache, unspecified: Secondary | ICD-10-CM | POA: Insufficient documentation

## 2015-04-26 MED ORDER — VENLAFAXINE HCL ER 37.5 MG PO CP24: ORAL_CAPSULE | ORAL | Status: DC

## 2015-04-26 NOTE — Patient Instructions (Signed)
Migraine Recommendations: 1.  Start venlafaxine ER 37.5mg , 1 capsule at bedtime for 7 days, then increase to 2 capsules at bedtime.  Call in 4 weeks with update and we can adjust dose if needed. 3.  Limit use of pain relievers to no more than 2 days out of the week.  These medications include acetaminophen, ibuprofen, triptans and narcotics.  This will help reduce risk of rebound headaches. 4.  Be aware of common food triggers such as processed sweets, processed foods with nitrites (such as deli meat, hot dogs, sausages), foods with MSG, alcohol (such as wine), chocolate, certain cheeses, certain fruits (dried fruits, some citrus fruit), vinegar, diet soda. 4.  Avoid caffeine 5.  Routine exercise 6.  Proper sleep hygiene 7.  Stay adequately hydrated with water 8.  Keep a headache diary. 9.  Maintain proper stress management. 10.  Do not skip meals. 11.  Will check MRI of brain with and without contrast and MRA of head 12.  Call in 4 weeks with update.  Follow up in 3 months.

## 2015-04-26 NOTE — Progress Notes (Signed)
NEUROLOGY CONSULTATION NOTE  Megan [name] MRN: [id number] DOB: 1951-02-06  Referring provider: Dr. Charlett Blake Primary care provider: Dr. Charlett Blake  Reason for consult:  headache  HISTORY OF PRESENT ILLNESS: Megan [name] is a 64 year old right-handed female with hypertension, hyperlipidemia, depression, IBS, hypothyroidism, and GERD who presents for headaches.  History obtained from patient and PCP note.  Images of brain MRI from 2013 and labs reviewed.  Onset:  Several months ago but getting worse.  At first it was thought to be due to sinusitis. Location:  Left occipital region Quality:  "like being hit with a baseball bat" Intensity:  "20/10" Aura:  no Prodrome:  Diaphoretic, weak Associated symptoms:  Nausea, vomiting, phonophobia, blurred vision Duration:  All day Frequency:  daily Triggers/exacerbating factors:  Stress, frustration Relieving factors:  Laying in dark room with ice pack  Past abortive medication:  Fioricet, Excedrin, Advil, Aleve Past preventative medication:  none Past medications for depression:  Cymbalta, amitriptyline, Prozac, Celexa, Lexapro, Zoloft  Current abortive medication:  Extra-strength Tylenol, Zofran 4mg  or Phenergan 25mg , 1/2 Xanax, hydrocodone (takes daily) Antihypertensive medications:  Coreg, diltiazem Antidepressant medications:  none Anticonvulsant medications:  none  She had an MRI of the brain with and without contrast on 08/07/12, which showed scattered subcortical FLAIR and T2 hyperintensities.  Recent labs included Sed Rate 6, B12 greater than 1500.  Caffeine:  1 cup coffee daily Alcohol:  no Smoker:  no Exercise:  Not routine Depression/stress:  She has history of depression.  She reports significant stress regarding caring for her grandchildren, great-grandchildren and sick dog.  She also has gastrointestinal ailments.  She reports short-term memory problems and occasional tremors in the hands. Sleep hygiene:   poor Family history of headache:  Father had headaches.  He also had Parkinson disease.  Paternal grandfather had cerebral aneurysm.  PAST MEDICAL HISTORY: Past Medical History  Diagnosis Date  . GERD (gastroesophageal reflux disease)   . Depression   . Anxiety   . Insomnia   . IBS (irritable bowel syndrome)   . Diverticulosis   . Barrett esophagus   . Hiatal hernia   . Unspecified menopausal and postmenopausal disorder   . Vitamin B12 deficiency   . Blind loop syndrome   . Hepatic cyst   . Unspecified hypothyroidism   . Diarrhea   . Family history of malignant neoplasm of gastrointestinal tract     multiple members  . Endometrial hyperplasia 02/27/2006    BENIGN ENDO BX ON 02/2007  . C. difficile diarrhea   . Cystocele, midline   . Rectocele   . Uterine prolapse without mention of vaginal wall prolapse   . Chest pain, atypical 07/14/2011  . Leaky heart valve   . Hiatal hernia   . 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 Thrush 07/21/2011  . Rectal bleeding 10/10/2011  . Sinusitis acute 02/03/2012  . Contact dermatitis 03/23/2012  . Headache(784.0) 04/15/2012  . 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 Hematuria 04/27/2012  . Allergic state 12/31/2012  . 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 Paronychia of great toe, left 06/19/2013  . Low back pain 08/15/2013  . Osteoporosis 05/2011    T score-2.5 Lt femoral neck 2012, T score -2.4 2016 FRAX 12%/2.4%  . 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 Hypertension   . Asthma   . 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 Preventative health care 09/11/2014  . Hyperlipidemia, mixed 12/17/2014  . Colon polyp   . Headache 04/16/2015    PAST SURGICAL HISTORY: Past Surgical History  Procedure Laterality Date  . Appendectomy    . Uterine fibroid surgery    . Hysteroscopy      POLYP    MEDICATIONS: Current Outpatient Prescriptions on File Prior to Visit  Medication Sig Dispense Refill  . alprazolam (XANAX) 2 MG tablet Take 1 tablet (2 mg total) by mouth 2 (two) times  daily as needed. 60 tablet 1  . carvedilol (COREG) 12.5 MG tablet Take 1 tablet (12.5 mg total) by mouth 2 (two) times daily. 180 tablet 3  . cefdinir (OMNICEF) 300 MG capsule Take 1 capsule (300 mg total) by mouth 2 (two) times daily. 20 capsule 0  . cyclobenzaprine (FLEXERIL) 10 MG tablet Take 1 tablet (10 mg total) by mouth 3 (three) times daily as needed for muscle spasms. 40 tablet 1  . dicyclomine (BENTYL) 10 MG capsule Take 1 capsule (10 mg total) by mouth 3 (three) times daily before meals. 30 capsule 0  . diltiazem (CARTIA XT) 180 MG 24 hr capsule Take 1 capsule (180 mg total) by mouth daily. 90 capsule 1  . fluticasone (FLONASE) 50 MCG/ACT nasal spray USE 2 SPRAYS IN EACH NOSTRIL EVERY DAY 16 g 2  . HYDROcodone-acetaminophen (NORCO) 10-325 MG per tablet Take 1 tablet by mouth every 8 (eight) hours as needed. 65 tablet 0  . hyoscyamine (LEVBID) 0.375 MG 12 hr tablet TAKE 2 TABLETS BY MOUTH TWICE DAILY AS NEEDED 360 tablet 2  . lactobacillus acidophilus (BACID) TABS tablet Take 2 tablets by mouth 3 (three) times daily.    . Linaclotide (LINZESS) 145 MCG CAPS capsule Take 145 mcg by mouth daily.    Marland Kitchen omeprazole (PRILOSEC) 40 MG capsule Take 1 capsule by mouth 2 times daily before meals.  Take 30 min before breakfast and dinner.    . ondansetron (ZOFRAN-ODT) 4 MG disintegrating tablet Take 1 tablet (4 mg total) by mouth every 8 (eight) hours as needed for nausea or vomiting. 30 tablet 0  . promethazine (PHENERGAN) 25 MG tablet TAKE 1 TABLET BY MOUTH THREE TIMES DAILY AS NEEDED FOR NAUSEA 30 tablet 0  . ranitidine (ZANTAC) 300 MG tablet TAKE 1 TABLET BY MOUTH AT BEDTIME. 90 tablet 1  . rifaximin (XIFAXAN) 550 MG TABS tablet Take 550 mg by mouth 2 (two) times daily.    . VENTOLIN HFA 108 (90 BASE) MCG/ACT inhaler INHALE 2 PUFFS BY MOUTH EVERY 6 HOURS AS NEEDED 18 each 1  . zolpidem (AMBIEN) 10 MG tablet Take 1 tablet (10 mg total) by mouth at bedtime as needed for sleep. 15 tablet 2  .  DULoxetine (CYMBALTA) 30 MG capsule TAKE 1 CAPSULE(30 MG) BY MOUTH DAILY (Patient not taking: Reported on 04/26/2015) 90 capsule 1   No current facility-administered medications on file prior to visit.    ALLERGIES: Allergies  Allergen Reactions  . Sulfamethoxazole Shortness Of Breath    chest tightness  . Amitriptyline Anxiety and Other (See Comments)    Depression, insomnia  . Augmentin [Amoxicillin-Pot Clavulanate] Diarrhea  . Iohexol   . Iodinated Diagnostic Agents     Pt states at Dr.Grapeys office 15 years ago blacked out for 2 hours during injection for IVP. Was told never to have IV dye again.     FAMILY HISTORY: Family History  Problem Relation Age of Onset  . Colon cancer Mother   . Diabetes Mother   . Hypertension Mother   . Cancer Mother     colon  . Colon polyps Father   . Parkinson's disease Father   .  Colon cancer Maternal Aunt   . Colon cancer Maternal Grandmother   . Breast cancer Maternal Grandmother 30  . Diabetes Paternal Grandmother   . Breast cancer Paternal Grandmother 47  . Colon cancer Cousin     X3  . Ovarian cancer Cousin   . Allergies Sister   . Arthritis Brother     b/l hip replace  . Alcohol abuse Daughter   . Arthritis Son   . Hypertension Son   . Allergies Son   . Osteoporosis Son   . Osteoporosis Son   . Arthritis Son     back disease, DDD  . Allergies Son   . Arthritis Son   . Other Son     RSD, IBS  . Hypertension Paternal Grandfather   . Heart disease Paternal Grandfather     aneurysm    SOCIAL HISTORY: Social History   Social History  . Marital Status: Married    Spouse Name: N/A  . Number of Children: 4  . Years of Education: N/A   Occupational History  . Retired    Social History Main Topics  . Smoking status: Former Smoker    Quit date: 08/19/1995  . Smokeless tobacco: Never Used  . Alcohol Use: No  . Drug Use: No  . Sexual Activity: Not Currently    Birth Control/ Protection: Post-menopausal      Comment: 1st intercourse 46 yo-5 partners   Other Topics Concern  . Not on file   Social History Narrative    REVIEW OF SYSTEMS: Constitutional: No fevers, chills, or sweats, no generalized fatigue, change in appetite Eyes: No visual changes, double vision, eye pain Ear, nose and throat: No hearing loss, ear pain, nasal congestion, sore throat Cardiovascular: No chest pain, palpitations Respiratory:  No shortness of breath at rest or with exertion, wheezes GastrointestinaI: Nausea, vomiting, upset stomach Genitourinary:  No dysuria, urinary retention or frequency Musculoskeletal:  No neck pain, back pain Integumentary: No rash, pruritus, skin lesions Neurological: as above Psychiatric: depression, insomnia, anxiety Endocrine: No palpitations, fatigue, diaphoresis, mood swings, change in appetite, change in weight, increased thirst Hematologic/Lymphatic:  No anemia, purpura, petechiae. Allergic/Immunologic: no itchy/runny eyes, nasal congestion, recent allergic reactions, rashes  PHYSICAL EXAM: Filed Vitals:   04/26/15 0934  BP: 118/64  Pulse: 77  Temp: 98.4 F (36.9 C)  Resp: 16   General: No acute distress.   Head:  Normocephalic/atraumatic Eyes:  fundi unremarkable, without vessel changes, exudates, hemorrhages or papilledema. Neck: supple, no paraspinal tenderness, full range of motion Back: No paraspinal tenderness Heart: regular rate and rhythm Lungs: Clear to auscultation bilaterally. Vascular: No carotid bruits. Neurological Exam: Mental status: alert and oriented to person, place, and time, recent and remote memory intact, fund of knowledge intact, attention and concentration intact, speech fluent and not dysarthric, language intact. Cranial nerves: CN I: not tested CN II: pupils equal, round and reactive to light, visual fields intact, fundi unremarkable, without vessel changes, exudates, hemorrhages or papilledema. CN III, IV, VI:  full range of motion, no  nystagmus, no ptosis CN V: facial sensation intact CN VII: upper and lower face symmetric CN VIII: hearing intact CN IX, X: gag intact, uvula midline CN XI: sternocleidomastoid and trapezius muscles intact CN XII: tongue midline Bulk & Tone: normal, no fasciculations. Motor:  5/5 throughout Sensation:  Pinprick and temperature reduced in toes and vibration sensation intact. Deep Tendon Reflexes:  2+ throughout, toes downgoing. Finger to nose testing:  Without dysmetria.  Heel to shin:  Without dysmetria.  Gait:  Normal station and stride.  Able to turn and tandem walk. Romberg negative.  IMPRESSION: New-onset daily headache in patient over 36 years old.  Sed Rate does not suggest temporal arteritis.  Symptoms seem consistent with migraine.  However further workup is warranted given her age and new onset of headaches and family history of cerebral aneurysm.  I feel her stress and anxiety is contributing to her headaches, as well as her other symptoms, such as tremor and forgetfullness.  PLAN: 1.  Will start venlafaxine ER, titrating from 37.5mg  to 75mg  daily 2.  Advised to limit pain relievers to no more than 2 days out of the week.  Try not to treat headaches with hydrocodone 3.  Sleep hygiene discussed 4.  Should pursue proper management for stress and depression. 5.  Follow up in 3 months.  She is to call in 4 weeks with update.  Thank you for allowing me to take part in the care of this patient.  Metta Clines, DO  CC: Penni Homans, MD

## 2015-04-29 NOTE — Assessment & Plan Note (Signed)
Encouraged increased hydration, 64 ounces of clear fluids daily. Minimize alcohol and caffeine. Eat small frequent meals with lean proteins and complex carbs. Avoid high and low blood sugars. Get adequate sleep, 7-8 hours a night. Needs exercise daily preferably in the morning. Worsening, referred to neurology for further consideration

## 2015-05-09 ENCOUNTER — Encounter: Payer: Self-pay | Admitting: Medical

## 2015-05-09 ENCOUNTER — Ambulatory Visit (INDEPENDENT_AMBULATORY_CARE_PROVIDER_SITE_OTHER): Payer: 59 | Admitting: Medical

## 2015-05-09 VITALS — BP 120/76 | HR 68 | Temp 97.7°F | Ht 66.0 in | Wt 163.0 lb

## 2015-05-09 DIAGNOSIS — N390 Urinary tract infection, site not specified: Secondary | ICD-10-CM

## 2015-05-09 DIAGNOSIS — B9789 Other viral agents as the cause of diseases classified elsewhere: Secondary | ICD-10-CM

## 2015-05-09 DIAGNOSIS — K219 Gastro-esophageal reflux disease without esophagitis: Secondary | ICD-10-CM | POA: Diagnosis not present

## 2015-05-09 DIAGNOSIS — R350 Frequency of micturition: Secondary | ICD-10-CM

## 2015-05-09 DIAGNOSIS — M549 Dorsalgia, unspecified: Secondary | ICD-10-CM | POA: Diagnosis not present

## 2015-05-09 DIAGNOSIS — R1084 Generalized abdominal pain: Secondary | ICD-10-CM | POA: Diagnosis not present

## 2015-05-09 DIAGNOSIS — J3489 Other specified disorders of nose and nasal sinuses: Secondary | ICD-10-CM

## 2015-05-09 DIAGNOSIS — K121 Other forms of stomatitis: Secondary | ICD-10-CM | POA: Diagnosis not present

## 2015-05-09 DIAGNOSIS — R82998 Other abnormal findings in urine: Secondary | ICD-10-CM

## 2015-05-09 LAB — COMPREHENSIVE METABOLIC PANEL
ALT: 16 U/L (ref 0–35)
AST: 15 U/L (ref 0–37)
Albumin: 4.1 g/dL (ref 3.5–5.2)
Alkaline Phosphatase: 83 U/L (ref 39–117)
BILIRUBIN TOTAL: 0.3 mg/dL (ref 0.2–1.2)
BUN: 7 mg/dL (ref 6–23)
CALCIUM: 9.2 mg/dL (ref 8.4–10.5)
CO2: 33 meq/L — AB (ref 19–32)
Chloride: 105 mEq/L (ref 96–112)
Creatinine, Ser: 0.58 mg/dL (ref 0.40–1.20)
GFR: 110.96 mL/min (ref >60.00)
Glucose, Bld: 88 mg/dL (ref 70–99)
Potassium: 4.1 mEq/L (ref 3.5–5.1)
Sodium: 142 mEq/L (ref 135–145)
Total Protein: 7.3 g/dL (ref 6.0–8.3)

## 2015-05-09 LAB — POCT URINALYSIS DIPSTICK
BILIRUBIN UA: NEGATIVE
Blood, UA: NEGATIVE
Glucose, UA: NEGATIVE
Ketones, UA: NEGATIVE
NITRITE UA: NEGATIVE
PH UA: 6.5
Protein, UA: NEGATIVE
Spec Grav, UA: <=1.005
Urobilinogen, UA: 0.2

## 2015-05-09 LAB — LIPASE: LIPASE: 12 U/L (ref 11.0–59.0)

## 2015-05-09 LAB — CBC WITH DIFFERENTIAL/PLATELET
BASOS ABS: 0 10*3/uL (ref 0.0–0.1)
BASOS PCT: 0.4 % (ref 0.0–3.0)
Eosinophils Absolute: 0.1 10*3/uL (ref 0.0–0.7)
Eosinophils Relative: 1.9 % (ref 0.0–5.0)
HEMATOCRIT: 39.8 % (ref 36.0–46.0)
Hemoglobin: 13.2 g/dL (ref 12.0–15.0)
LYMPHS PCT: 33.5 % (ref 12.0–46.0)
Lymphs Abs: 2.5 10*3/uL (ref 0.7–4.0)
MCHC: 33.2 g/dL (ref 30.0–36.0)
MCV: 93.6 fl (ref 78.0–100.0)
MONOS PCT: 9.9 % (ref 3.0–12.0)
Monocytes Absolute: 0.7 10*3/uL (ref 0.1–1.0)
NEUTROS ABS: 4.1 10*3/uL (ref 1.4–7.7)
Neutrophils Relative %: 54.3 % (ref 43.0–77.0)
PLATELETS: 318 10*3/uL (ref 150.0–400.0)
RBC: 4.26 Mil/uL (ref 3.87–5.11)
RDW: 13.1 % (ref 11.5–15.5)
WBC: 7.5 10*3/uL (ref 4.0–10.5)

## 2015-05-09 LAB — AMYLASE: AMYLASE: 31 U/L (ref 27–131)

## 2015-05-09 MED ORDER — MAGIC MOUTHWASH: ORAL | Status: DC

## 2015-05-09 NOTE — Progress Notes (Signed)
Pre visit review using our clinic review tool, if applicable. No additional management support is needed unless otherwise documented below in the visit note. 

## 2015-05-09 NOTE — Progress Notes (Signed)
Subjective:    Patient ID: Megan [name], female    DOB: [birth year], 64 y.o.   MRN: [id number]  HPI  Pt in for sores in her mouth. Pt has some sinus congestion recently past 2  Months. She reports a lot of pnd drainage. Pt was on augmentin just recently then given cefdnir. Her sinus don't feel better. She still has intermittent pain and pressure(but not today) Pt has seen ENT in the past. She has deviated septum. Pt told she may need surgery when she saw ENT. Pt also states tomorrow scheduled to get ct of her head. This was ordered by Dr. Charlett Blake.  The this weekend. She had small blister in roof of her mouth. Hurts to swollow when food passes the area.  Pt has history of a lot of GI diagnosis. She does not digest her food well per pt. Pt feels like flare of her  heart burn. Pt is on omeprazole. Pt  GI told her to come back as needed. Last saw GI in May. Went to [facility]. No diarrhea recently. Normal bm recently. Pt is on zantac 300 mg at night. Omeprazole 20 mg in am. Today ok today for reflux.  Just recently/last night some faint lt cva pain. Some frequent urination last night.   Review of Systems  Constitutional: Positive for fatigue. Negative for fever, chills, diaphoresis and activity change.  HENT: Positive for sinus pressure. Negative for congestion, ear pain, nosebleeds, postnasal drip and rhinorrhea.        Recent but not today on exam.  Respiratory: Negative for cough, chest tightness and shortness of breath.   Cardiovascular: Negative for chest pain, palpitations and leg swelling.  Gastrointestinal: Positive for abdominal pain. Negative for nausea, vomiting, diarrhea, constipation, blood in stool, abdominal distention and anal bleeding.       Diffuse mild abdomen pain recently.  Endocrine: Negative for polydipsia and polyphagia.  Genitourinary: Positive for frequency and difficulty urinating. Negative for dysuria, urgency, flank pain, pelvic pain and dyspareunia.    Musculoskeletal: Positive for back pain. Negative for neck pain and neck stiffness.       Lt cva area pain.  Neurological: Negative for dizziness, tremors, seizures, syncope, facial asymmetry, speech difficulty, weakness, light-headedness, numbness and headaches.  Psychiatric/Behavioral: Negative for behavioral problems, confusion and agitation. The patient is not nervous/anxious.      Past Medical History  Diagnosis Date  . GERD (gastroesophageal reflux disease)   . Depression   . Anxiety   . Insomnia   . IBS (irritable bowel syndrome)   . Diverticulosis   . Barrett esophagus   . Hiatal hernia   . Unspecified menopausal and postmenopausal disorder   . Vitamin B12 deficiency   . Blind loop syndrome   . Hepatic cyst   . Unspecified hypothyroidism   . Diarrhea   . Family history of malignant neoplasm of gastrointestinal tract     multiple members  . Endometrial hyperplasia 02/27/2006    BENIGN ENDO BX ON 02/2007  . C. difficile diarrhea   . Cystocele, midline   . Rectocele   . Uterine prolapse without mention of vaginal wall prolapse   . Chest pain, atypical 07/14/2011  . Leaky heart valve   . Hiatal hernia   . 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 Thrush 07/21/2011  . Rectal bleeding 10/10/2011  . Sinusitis acute 02/03/2012  . Contact dermatitis 03/23/2012  . Headache(784.0) 04/15/2012  . 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 Hematuria 04/27/2012  . Allergic state 12/31/2012  .  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 Paronychia of great toe, left 06/19/2013  . Low back pain 08/15/2013  . Osteoporosis 05/2011    T score-2.5 Lt femoral neck 2012, T score -2.4 2016 FRAX 12%/2.4%  . 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 Hypertension   . Asthma   . 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 Preventative health care 09/11/2014  . Hyperlipidemia, mixed 12/17/2014  . Colon polyp   . Headache 04/16/2015    Social History   Social History  . Marital Status: Married    Spouse Name: N/A  . Number of Children: 4   . Years of Education: N/A   Occupational History  . Retired    Social History Main Topics  . Smoking status: Former Smoker    Quit date: 08/19/1995  . Smokeless tobacco: Never Used  . Alcohol Use: No  . Drug Use: No  . Sexual Activity: Not Currently    Birth Control/ Protection: Post-menopausal     Comment: 1st intercourse 40 yo-5 partners   Other Topics Concern  . Not on file   Social History Narrative    Past Surgical History  Procedure Laterality Date  . Appendectomy    . Uterine fibroid surgery    . Hysteroscopy      POLYP    Family History  Problem Relation Age of Onset  . Colon cancer Mother   . Diabetes Mother   . Hypertension Mother   . Cancer Mother     colon  . Colon polyps Father   . Parkinson's disease Father   . Colon cancer Maternal Aunt   . Colon cancer Maternal Grandmother   . Breast cancer Maternal Grandmother 43  . Diabetes Paternal Grandmother   . Breast cancer Paternal Grandmother 58  . Colon cancer Cousin     X3  . Ovarian cancer Cousin   . Allergies Sister   . Arthritis Brother     b/l hip replace  . Alcohol abuse Daughter   . Arthritis Son   . Hypertension Son   . Allergies Son   . Osteoporosis Son   . Osteoporosis Son   . Arthritis Son     back disease, DDD  . Allergies Son   . Arthritis Son   . Other Son     RSD, IBS  . Hypertension Paternal Grandfather   . Heart disease Paternal Grandfather     aneurysm    Allergies  Allergen Reactions  . Sulfamethoxazole Shortness Of Breath    chest tightness  . Amitriptyline Anxiety and Other (See Comments)    Depression, insomnia  . Augmentin [Amoxicillin-Pot Clavulanate] Diarrhea  . Iohexol   . Iodinated Diagnostic Agents     Pt states at Dr.Grapeys office 15 years ago blacked out for 2 hours during injection for IVP. Was told never to have IV dye again.     Current Outpatient Prescriptions on File Prior to Visit  Medication Sig Dispense Refill  . alprazolam (XANAX) 2 MG  tablet Take 1 tablet (2 mg total) by mouth 2 (two) times daily as needed. 60 tablet 1  . carvedilol (COREG) 12.5 MG tablet Take 1 tablet (12.5 mg total) by mouth 2 (two) times daily. 180 tablet 3  . cyclobenzaprine (FLEXERIL) 10 MG tablet Take 1 tablet (10 mg total) by mouth 3 (three) times daily as needed for muscle spasms. 40 tablet 1  . dicyclomine (BENTYL) 10 MG capsule Take 1 capsule (10 mg total) by mouth 3 (three) times daily before meals. [place]  capsule 0  . diltiazem (CARTIA XT) 180 MG 24 hr capsule Take 1 capsule (180 mg total) by mouth daily. 90 capsule 1  . fluticasone (FLONASE) 50 MCG/ACT nasal spray USE 2 SPRAYS IN EACH NOSTRIL EVERY DAY 16 g 2  . HYDROcodone-acetaminophen (NORCO) 10-325 MG per tablet Take 1 tablet by mouth every 8 (eight) hours as needed. 65 tablet 0  . hyoscyamine (LEVBID) 0.375 MG 12 hr tablet TAKE 2 TABLETS BY MOUTH TWICE DAILY AS NEEDED 360 tablet 2  . lactobacillus acidophilus (BACID) TABS tablet Take 2 tablets by mouth 3 (three) times daily.    . Linaclotide (LINZESS) 145 MCG CAPS capsule Take 145 mcg by mouth daily.    Marland Kitchen omeprazole (PRILOSEC) 40 MG capsule Take 1 capsule by mouth 2 times daily before meals.  Take 30 min before breakfast and dinner.    . ondansetron (ZOFRAN-ODT) 4 MG disintegrating tablet Take 1 tablet (4 mg total) by mouth every 8 (eight) hours as needed for nausea or vomiting. 30 tablet 0  . promethazine (PHENERGAN) 25 MG tablet TAKE 1 TABLET BY MOUTH THREE TIMES DAILY AS NEEDED FOR NAUSEA 30 tablet 0  . ranitidine (ZANTAC) 300 MG tablet TAKE 1 TABLET BY MOUTH AT BEDTIME. 90 tablet 1  . rifaximin (XIFAXAN) 550 MG TABS tablet Take 550 mg by mouth 2 (two) times daily.    Marland Kitchen venlafaxine XR (EFFEXOR-XR) 37.5 MG 24 hr capsule Take 1capsule at bedtime for 7 days, then increase to 2 capsules at bedtime. 60 capsule 0  . VENTOLIN HFA 108 (90 BASE) MCG/ACT inhaler INHALE 2 PUFFS BY MOUTH EVERY 6 HOURS AS NEEDED 18 each 1  . zolpidem (AMBIEN) 10 MG tablet  Take 1 tablet (10 mg total) by mouth at bedtime as needed for sleep. 15 tablet 2   No current facility-administered medications on file prior to visit.    BP 120/76 mmHg  Pulse 68  Temp(Src) 97.7 F (36.5 C) (Oral)  Ht 5\' 6"  (1.676 m)  Wt 163 lb (73.936 kg)  BMI 26.32 kg/m2  SpO2 98%       Objective:   Physical Exam  General Mental Status- Alert. General Appearance- Not in acute distress.    HEENT Head- Normal. Ear Auditory Canal - Left- Normal. Right - Normal.Tympanic Membrane- Left- Normal. Right- Normal. Eye Sclera/Conjunctiva- Left- Normal. Right- Normal. Nose & Sinuses Nasal Mucosa- Left-  Not boggy or Congested. Right-  Not  boggy or Congested. No tenderness  to sinus today. Mouth & Throat Lips: Upper Lip- Normal: no dryness, cracking, pallor, cyanosis, or vesicular eruption. Lower Lip-Normal: no dryness, cracking, pallor, cyanosis or vesicular eruption. Buccal Mucosa- Bilateral-  Aphthous ulcers upper mid palate.  Oropharynx- No Discharge or Erythema. Tonsils: Characteristics- Bilateral- No Erythema or Congestion. Size/Enlargement- Bilateral- No enlargement. Discharge- bilateral-None.  Neck Neck- Supple. No Masses.   Chest and Lung Exam Auscultation: Breath Sounds:- even and unlabored.  Cardiovascular Auscultation:Rythm- Regular, rate and rhythm. Murmurs & Other Heart Sounds:Ausculatation of the heart reveal- No Murmurs.  Lymphatic Head & Neck General Head & Neck Lymphatics: Bilateral: Description- No Localized lymphadenopathy.   Neurologic Cranial Nerve exam:- CN III-XII intact(No nystagmus), symmetric smile. Strength:- 5/5 equal and symmetric strength both upper and lower extremities.    Abdomen Inspection:-Inspection Normal.  Palpation/Perucssion: Palpation and Percussion of the abdomen reveal- faint diffuse  Tender, No Rebound tenderness, No rigidity(Guarding) and No Palpable abdominal masses.  Liver:-Normal.  Spleen:- Normal.   Back- faint  lt cva pain.  Assessment & Plan:  For your apthous ulcers which are likley viral in origin will rx magic mouth wash. This should clear up in 3-5 days. Avoid salty and crunchy foods.   For your heart burn I would continue both zantac and prilosec. Today is good day. If symptoms persist consider med such as reglan.  Since some of abd pain is generalized will get labs today. Update [place] on symptom changes.  Will get ua dip and send urine culture out. But will rx antibiotic is culture + or urinary symptoms worsen.  With sinus pressure and recent use of antibiotics, I will wait and see what CT of head shows tomorrow. They often will comment on sinuses. Please call Dr. Charlett Blake nurse for update on that report.  Follow up in 1 wk or as needed

## 2015-05-09 NOTE — Patient Instructions (Addendum)
For your apthous ulcers which are likley viral in origin will rx magic mouth wash. This should clear up in 3-5 days. Avoid salty and crunchy foods.   For your heart burn I would continue both zantac and prilosec. Today is good day. If symptoms persist consider med such as reglan.  Since some of abd pain is generalized will get labs today. Update [place] on symptom changes.  Will get ua dip and send urine culture out. But will rx antibiotic is culture + or urinary symptoms worsen.  With sinus pressure and recent use of antibiotics, I will wait and see what CT of head shows tomorrow. They often will comment on sinuses. Please call Dr. Charlett Blake nurse for update on that report.  Follow up in 1 wk or as needed.

## 2015-05-10 ENCOUNTER — Ambulatory Visit
Admission: RE | Admit: 2015-05-10 | Discharge: 2015-05-10 | Disposition: A | Discharge status: Home / Self Care | Payer: 59 | Source: Ambulatory Visit | Attending: Neurology | Admitting: Neurology

## 2015-05-10 DIAGNOSIS — R51 Headache: Principal | ICD-10-CM

## 2015-05-10 DIAGNOSIS — R519 Headache, unspecified: Secondary | ICD-10-CM

## 2015-05-10 DIAGNOSIS — Z8249 Family history of ischemic heart disease and other diseases of the circulatory system: Secondary | ICD-10-CM

## 2015-05-10 LAB — URINE CULTURE
Colony Count: NO GROWTH
Organism ID, Bacteria: NO GROWTH

## 2015-05-10 MED ORDER — GADOBENATE DIMEGLUMINE 529 MG/ML IV SOLN
15.0000 mL | Freq: Once | INTRAVENOUS | Status: AC | PRN
  Administered 2015-05-10: 15 mL via INTRAVENOUS

## 2015-05-11 ENCOUNTER — Telehealth: Payer: Self-pay

## 2015-05-11 ENCOUNTER — Telehealth: Payer: Self-pay | Admitting: Medical

## 2015-05-11 NOTE — Telephone Encounter (Signed)
-----   Message from Pieter Partridge, DO sent at 05/11/2015  7:09 AM EDT ----- MRI of the brain does not reveal any large strokes, bleeds, aneurysms or tumors.  It does show some changes in the brain that is attributed to her history of high blood pressure and cholesterol.  I would recommend starting an aspirin 81mg  daily.

## 2015-05-11 NOTE — Telephone Encounter (Signed)
Dr. Charlett Blake want to send you note regarding her sinus pressure complaint, mri we reviewed and reminder to call pt on this coming Monday.

## 2015-05-11 NOTE — Telephone Encounter (Signed)
Lmtcb.

## 2015-05-12 NOTE — Telephone Encounter (Signed)
Hoping you would look at this mutual patient's MRI. She was in to see a PA here complaining of sinus pressure and while the MRI does not show an obvious causae of that complaint. Wondering if you think I should do anything about her PCA circulation? I have started her on Aspirin.

## 2015-05-14 ENCOUNTER — Telehealth: Payer: Self-pay | Admitting: Family Medicine

## 2015-05-14 NOTE — Telephone Encounter (Signed)
Patient informed. 

## 2015-05-14 NOTE — Telephone Encounter (Signed)
Patient informed of PCP instructions. 

## 2015-05-14 NOTE — Telephone Encounter (Signed)
ASA is enough.

## 2015-05-14 NOTE — Telephone Encounter (Signed)
Patient was explained to start 81 mg asprin daily and given results of her MRI. Patient relieved and will start ASA

## 2015-05-14 NOTE — Telephone Encounter (Signed)
Let patient know neurology concurs with aspirin no further changes

## 2015-05-14 NOTE — Telephone Encounter (Signed)
-----   Message from [name], MD sent at 05/14/2015  1:19 PM EDT ----- So no treatment unless high grade fever if this is true needs to be seen. Encourage 24 hours of bowel rest with clear fluids then as she feels better 24 hours of simple carbs (BRAT diet/Bananas, Rice Applesauce, Toast) then advance as tolerated ----- Message -----    From: Jamesetta Orleans, CMA    Sent: 05/14/2015  12:16 PM      To: [name], MD   Patient informed to take asa.  Patient took care of her new born grandson over the weekend and woke with the intestinal bug he had.  States on top of her intestinal problems already.  Do you recommend anything to do or script. ----- Message -----    From: [name], MD    Sent: 05/12/2015   5:00 PM      To: Donell Sievert Ewing, CMA  Please have her start Aspirin 81 mg daily and let her know I have forwarded her MRI results to neurology for further consideration. THX

## 2015-05-14 NOTE — Telephone Encounter (Signed)
LM for patient to contact the office.

## 2015-05-17 ENCOUNTER — Other Ambulatory Visit: Payer: Self-pay | Admitting: Family Medicine

## 2015-05-18 ENCOUNTER — Other Ambulatory Visit: Payer: Self-pay | Admitting: Family Medicine

## 2015-05-18 DIAGNOSIS — M25579 Pain in unspecified ankle and joints of unspecified foot: Secondary | ICD-10-CM

## 2015-05-18 MED ORDER — HYDROCODONE-ACETAMINOPHEN 10-325 MG PO TABS: 1.0000 | ORAL_TABLET | Freq: Three times a day (TID) | ORAL | Status: DC | PRN

## 2015-05-23 ENCOUNTER — Other Ambulatory Visit: Payer: Self-pay | Admitting: Neurology

## 2015-05-24 ENCOUNTER — Other Ambulatory Visit: Payer: Self-pay | Admitting: Neurology

## 2015-05-24 MED ORDER — VENLAFAXINE HCL ER 75 MG PO TB24: 75.0000 mg | ORAL_TABLET | Freq: Every day | ORAL | Status: DC

## 2015-05-28 ENCOUNTER — Encounter (HOSPITAL_COMMUNITY): Payer: Self-pay

## 2015-05-28 ENCOUNTER — Telehealth: Payer: Self-pay | Admitting: Medical

## 2015-05-28 ENCOUNTER — Encounter: Payer: 59 | Admitting: Medical

## 2015-05-28 ENCOUNTER — Emergency Department (HOSPITAL_COMMUNITY)
Admission: EM | Admit: 2015-05-28 | Discharge: 2015-05-28 | Disposition: A | Discharge status: Home / Self Care | Payer: 59 | Attending: Emergency Medicine | Admitting: Emergency Medicine

## 2015-05-28 ENCOUNTER — Emergency Department (HOSPITAL_COMMUNITY): Payer: 59

## 2015-05-28 ENCOUNTER — Telehealth: Payer: Self-pay | Admitting: Family Medicine

## 2015-05-28 DIAGNOSIS — J45909 Unspecified asthma, uncomplicated: Secondary | ICD-10-CM | POA: Insufficient documentation

## 2015-05-28 DIAGNOSIS — I10 Essential (primary) hypertension: Secondary | ICD-10-CM | POA: Insufficient documentation

## 2015-05-28 DIAGNOSIS — Z79899 Other long term (current) drug therapy: Secondary | ICD-10-CM | POA: Insufficient documentation

## 2015-05-28 DIAGNOSIS — R079 Chest pain, unspecified: Secondary | ICD-10-CM | POA: Diagnosis present

## 2015-05-28 DIAGNOSIS — F329 Major depressive disorder, single episode, unspecified: Secondary | ICD-10-CM | POA: Insufficient documentation

## 2015-05-28 DIAGNOSIS — Z87891 Personal history of nicotine dependence: Secondary | ICD-10-CM | POA: Insufficient documentation

## 2015-05-28 DIAGNOSIS — F419 Anxiety disorder, unspecified: Secondary | ICD-10-CM | POA: Insufficient documentation

## 2015-05-28 DIAGNOSIS — Z86010 Personal history of colonic polyps: Secondary | ICD-10-CM | POA: Insufficient documentation

## 2015-05-28 DIAGNOSIS — K219 Gastro-esophageal reflux disease without esophagitis: Secondary | ICD-10-CM | POA: Insufficient documentation

## 2015-05-28 DIAGNOSIS — Z7951 Long term (current) use of inhaled steroids: Secondary | ICD-10-CM | POA: Insufficient documentation

## 2015-05-28 DIAGNOSIS — E039 Hypothyroidism, unspecified: Secondary | ICD-10-CM | POA: Insufficient documentation

## 2015-05-28 DIAGNOSIS — E782 Mixed hyperlipidemia: Secondary | ICD-10-CM | POA: Diagnosis not present

## 2015-05-28 LAB — BASIC METABOLIC PANEL
ANION GAP: 9 (ref 5–15)
BUN: 5 mg/dL — ABNORMAL LOW (ref 6–20)
CALCIUM: 9.4 mg/dL (ref 8.9–10.3)
CHLORIDE: 106 mmol/L (ref 101–111)
CO2: 25 mmol/L (ref 22–32)
CREATININE: 0.67 mg/dL (ref 0.44–1.00)
GFR calc non Af Amer: >60 mL/min (ref >60)
Glucose, Bld: 109 mg/dL — ABNORMAL HIGH (ref 65–99)
Potassium: 4 mmol/L (ref 3.5–5.1)
SODIUM: 140 mmol/L (ref 135–145)

## 2015-05-28 LAB — URINALYSIS, ROUTINE W REFLEX MICROSCOPIC
BILIRUBIN URINE: NEGATIVE
Glucose, UA: NEGATIVE mg/dL
HGB URINE DIPSTICK: NEGATIVE
KETONES UR: NEGATIVE mg/dL
Leukocytes, UA: NEGATIVE
NITRITE: NEGATIVE
PROTEIN: NEGATIVE mg/dL
Specific Gravity, Urine: 1.007 (ref 1.005–1.030)
UROBILINOGEN UA: 0.2 mg/dL (ref 0.0–1.0)
pH: 7.5 (ref 5.0–8.0)

## 2015-05-28 LAB — I-STAT TROPONIN, ED
TROPONIN I, POC: 0 ng/mL (ref 0.00–0.08)
TROPONIN I, POC: 0 ng/mL (ref 0.00–0.08)

## 2015-05-28 LAB — CBC
HCT: 39.5 % (ref 36.0–46.0)
HEMOGLOBIN: 13.1 g/dL (ref 12.0–15.0)
MCH: 31 pg (ref 26.0–34.0)
MCHC: 33.2 g/dL (ref 30.0–36.0)
MCV: 93.4 fL (ref 78.0–100.0)
PLATELETS: 283 10*3/uL (ref 150–400)
RBC: 4.23 MIL/uL (ref 3.87–5.11)
RDW: 12.6 % (ref 11.5–15.5)
WBC: 7.9 10*3/uL (ref 4.0–10.5)

## 2015-05-28 MED ORDER — ASPIRIN 81 MG PO CHEW
324.0000 mg | CHEWABLE_TABLET | Freq: Once | ORAL | Status: AC
  Administered 2015-05-28: 324 mg via ORAL
  Filled 2015-05-28: qty 4

## 2015-05-28 MED ORDER — NITROGLYCERIN 0.4 MG SL SUBL
0.4000 mg | SUBLINGUAL_TABLET | SUBLINGUAL | Status: DC | PRN
  Filled 2015-05-28: qty 1

## 2015-05-28 NOTE — ED Provider Notes (Signed)
CSN: [id number]     Arrival date & time 05/28/15  3244 History   First MD Initiated Contact with Patient 05/28/15 [phone]     Chief Complaint  Patient presents with  . Chest Pain     (Consider location/radiation/quality/duration/timing/severity/associated sxs/prior Treatment) HPI  73 old female presents with central chest pain that has been present for the past 3 days. Pain is always present but seems to wax and wane throughout the day. Feels like someone is kicking her in her chest. No shortness of breath but has felt chest congestion. Earlier in the week she felt nonspecific fatigue and headache but no other symptoms such as vomiting, diarrhea, or abdominal pain. No urinary symptoms. No exertional symptoms, nothing makes better or worse.   Past Medical History  Diagnosis Date  . GERD (gastroesophageal reflux disease)   . Depression   . Anxiety   . Insomnia   . IBS (irritable bowel syndrome)   . Diverticulosis   . Barrett esophagus   . Hiatal hernia   . Unspecified menopausal and postmenopausal disorder   . Vitamin B12 deficiency   . Blind loop syndrome   . Hepatic cyst   . Unspecified hypothyroidism   . Diarrhea   . Family history of malignant neoplasm of gastrointestinal tract     multiple members  . Endometrial hyperplasia 02/27/2006    BENIGN ENDO BX ON 02/2007  . C. difficile diarrhea   . Cystocele, midline   . Rectocele   . Uterine prolapse without mention of vaginal wall prolapse   . Chest pain, atypical 07/14/2011  . Leaky heart valve   . Hiatal hernia   . 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 Thrush 07/21/2011  . Rectal bleeding 10/10/2011  . Sinusitis acute 02/03/2012  . Contact dermatitis 03/23/2012  . Headache(784.0) 04/15/2012  . 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 Hematuria 04/27/2012  . Allergic state 12/31/2012  . 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 Paronychia of great toe, left 06/19/2013  . Low back pain 08/15/2013  . Osteoporosis 05/2011    T score-2.5 Lt femoral  neck 2012, T score -2.4 2016 FRAX 12%/2.4%  . Adrenal gland cyst ([place]) 05/22/2014  . Hypertension   . Asthma   . 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 Preventative health care 09/11/2014  . Hyperlipidemia, mixed 12/17/2014  . Colon polyp   . Headache 04/16/2015   Past Surgical History  Procedure Laterality Date  . Appendectomy    . Uterine fibroid surgery    . Hysteroscopy      POLYP   Family History  Problem Relation Age of Onset  . Colon cancer Mother   . Diabetes Mother   . Hypertension Mother   . Cancer Mother     colon  . Colon polyps Father   . Parkinson's disease Father   . Colon cancer Maternal Aunt   . Colon cancer Maternal Grandmother   . Breast cancer Maternal Grandmother 45  . Diabetes Paternal Grandmother   . Breast cancer Paternal Grandmother 31  . Colon cancer Cousin     X3  . Ovarian cancer Cousin   . Allergies Sister   . Arthritis Brother     b/l hip replace  . Alcohol abuse Daughter   . Arthritis Son   . Hypertension Son   . Allergies Son   . Osteoporosis Son   . Osteoporosis Son   . Arthritis Son     back disease, DDD  . Allergies Son   . Arthritis Son   .  Other Son     RSD, IBS  . Hypertension Paternal Grandfather   . Heart disease Paternal Grandfather     aneurysm   Social History  Substance Use Topics  . Smoking status: Former Smoker    Quit date: 08/19/1995  . Smokeless tobacco: Never Used  . Alcohol Use: No   OB History    Gravida Para Term Preterm AB TAB SAB Ectopic Multiple Living   6 4   2     4      Review of Systems  Constitutional: Positive for fatigue. Negative for fever.  HENT: Positive for congestion.   Respiratory: Negative for cough.   Cardiovascular: Positive for chest pain.  Gastrointestinal: Negative for vomiting and diarrhea.  Genitourinary: Negative for dysuria.  All other systems reviewed and are negative.     Allergies  Sulfamethoxazole; Amitriptyline; Augmentin; Iohexol; and Iodinated diagnostic agents  Home  Medications   Prior to Admission medications   Medication Sig Start Date End Date Taking? Authorizing Provider  alprazolam Duanne Moron) 2 MG tablet Take 1 tablet (2 mg total) by mouth 2 (two) times daily as needed. 02/20/15   [name], MD  carvedilol (COREG) 12.5 MG tablet Take 1 tablet (12.5 mg total) by mouth 2 (two) times daily. 12/25/14   [name], MD  cyclobenzaprine (FLEXERIL) 10 MG tablet Take 1 tablet (10 mg total) by mouth 3 (three) times daily as needed for muscle spasms. 03/21/15   Sharion Balloon, FNP  dicyclomine (BENTYL) 10 MG capsule Take 1 capsule (10 mg total) by mouth 3 (three) times daily before meals. 11/24/14   Tori Milks, MD  diltiazem (CARTIA XT) 180 MG 24 hr capsule Take 1 capsule (180 mg total) by mouth daily. 01/26/15   [name], MD  fluticasone (FLONASE) 50 MCG/ACT nasal spray USE 2 SPRAYS IN EACH NOSTRIL EVERY DAY 11/02/14   [name], MD  HYDROcodone-acetaminophen (NORCO) 10-325 MG tablet Take 1 tablet by mouth every 8 (eight) hours as needed. 05/18/15   [name], MD  hyoscyamine (LEVBID) 0.375 MG 12 hr tablet TAKE 2 TABLETS BY MOUTH TWICE DAILY AS NEEDED 08/02/14   [name], MD  lactobacillus acidophilus (BACID) TABS tablet Take 2 tablets by mouth 3 (three) times daily.    Historical Provider, MD  Linaclotide Rolan Lipa) 145 MCG CAPS capsule Take 145 mcg by mouth daily.    Historical Provider, MD  magic mouthwash SOLN 5 ml po qid swish and spit. 05/09/15   Percell Miller Saguier, PA-C  omeprazole (PRILOSEC) 40 MG capsule Take 1 capsule by mouth 2 times daily before meals.  Take 30 min before breakfast and dinner.    Historical Provider, MD  ondansetron (ZOFRAN-ODT) 4 MG disintegrating tablet Take 1 tablet (4 mg total) by mouth every 8 (eight) hours as needed for nausea or vomiting. 01/26/15   [name], MD  promethazine (PHENERGAN) 25 MG tablet TAKE 1 TABLET BY MOUTH THREE TIMES DAILY AS NEEDED FOR NAUSEA 02/20/15   [name], MD  ranitidine (ZANTAC)  300 MG tablet TAKE 1 TABLET BY MOUTH AT BEDTIME. 03/21/15   [name], MD  rifaximin (XIFAXAN) 550 MG TABS tablet Take 550 mg by mouth 2 (two) times daily.    Historical Provider, MD  Venlafaxine HCl 75 MG TB24 Take 1 tablet (75 mg total) by mouth daily. 05/24/15   Adam Telford Nab, DO  VENTOLIN HFA 108 (90 BASE) MCG/ACT inhaler INHALE 2 PUFFS BY MOUTH EVERY 6  HOURS AS NEEDED 12/25/14   [name], MD  zolpidem (AMBIEN) 10 MG tablet Take 1 tablet (10 mg total) by mouth at bedtime as needed for sleep. 09/11/14   [name], MD   There were no vitals taken for this visit. Physical Exam  Constitutional: She is oriented to person, place, and time. She appears well-developed and well-nourished.  HENT:  Head: Normocephalic and atraumatic.  Right Ear: External ear normal.  Left Ear: External ear normal.  Nose: Nose normal.  Eyes: Right eye exhibits no discharge. Left eye exhibits no discharge.  Cardiovascular: Normal rate, regular rhythm and normal heart sounds.   Pulmonary/Chest: Effort normal and breath sounds normal. She has no wheezes. She exhibits no tenderness.  Abdominal: Soft. She exhibits no distension. There is no tenderness.  Musculoskeletal: She exhibits no edema.  Neurological: She is alert and oriented to person, place, and time.  Skin: Skin is warm and dry.  Nursing note and vitals reviewed.   ED Course  Procedures (including critical care time) Labs Review Labs Reviewed  BASIC METABOLIC PANEL - Abnormal; Notable for the following:    Glucose, Bld 109 (*)    BUN 5 (*)    All other components within normal limits  CBC  URINALYSIS, ROUTINE W REFLEX MICROSCOPIC (NOT AT [facility])  I-STAT TROPOININ, ED  Randolm Idol, ED    Imaging Review Dg Chest 2 View  05/28/2015   CLINICAL DATA:  Chest pain for 2 days intermittently and sharp. Nausea.  EXAM: CHEST  2 VIEW  COMPARISON:  08/01/2013  FINDINGS: Atherosclerotic aortic arch. Cardiac and mediastinal contours otherwise  normal.  The lungs appear clear.  No pleural effusion.  IMPRESSION: 1. Stable atherosclerotic calcification of the aortic arch. Otherwise, no significant abnormalities are observed.   Electronically Signed   By: Van Clines M.D.   On: 05/28/2015 10:47   I have personally reviewed and evaluated these images and lab results as part of my medical decision-making.   EKG Interpretation   Date/Time:  Monday May 28 2015 09:28:35 EDT Ventricular Rate:  70 PR Interval:  153 QRS Duration: 119 QT Interval:  417 QTC Calculation: 450 R Axis:   76 Text Interpretation:  Sinus rhythm Incomplete right bundle branch block no  significant change since 2011 Confirmed by Kaeley Vinje  MD, Deandre Stansel ([id number]) on  05/28/2015 9:45:40 AM       EKG Interpretation  Date/Time:  Monday May 28 2015 09:28:35 EDT Ventricular Rate:  70 PR Interval:  153 QRS Duration: 119 QT Interval:  417 QTC Calculation: 450 R Axis:   76 Text Interpretation:  Sinus rhythm Incomplete right bundle branch block no significant change since 2011 Confirmed by Tasha Diaz  MD, Jahmya Onofrio ([id number]) on 05/28/2015 9:45:40 AM       MDM   Final diagnoses:  Chest pain, unspecified chest pain type    Patient's pain resolved prior to even getting nitroglycerin. Her chest pain is atypical in the setting of vague overall weakness for the past couple days it is unclear if her exact etiology. Chest pain does occasionally feel worse with lying flat there could be a pericarditis component. Has 2 negative troponins and a nonspecific but unchanged and not changing EKG. HEART score is 3. This is not c/w PE. After discussion with patient, she wants to go home and f/u closely with her cardiologist (sees for mitral regurg). Discussed needing close follow up in next 1-2 days and return if any symptoms worsen.     Jasman Murri  Regenia Skeeter, MD 05/28/15 [phone]

## 2015-05-28 NOTE — Telephone Encounter (Signed)
Per pt's chart, pt went to the ER.

## 2015-05-28 NOTE — Telephone Encounter (Signed)
[facility] - Client TELEPHONE ADVICE RECORD [facility]  Patient Name: Megan [name]  DOB: 01-23-1951    Initial Comment Caller states she has been having chest pains all weekend. Nausea.   Nurse Assessment  Nurse: Wynetta Emery, RN, Baker Janus Date/Time Eilene Ghazi Time): 05/28/2015 8:12:42 AM  Confirm and document reason for call. If symptomatic, describe symptoms. ---Tye [place] has described her chest pain she has been having all weekend as a horse kicking her -- the pain wakes her up at night -- elevated blood pressure and 178/74 64 the pulse rate is staying in the 60's nausea also present getting worse over last two days  Has the patient traveled out of the country within the last 30 days? ---No  Does the patient have any new or worsening symptoms? ---Yes  Will a triage be completed? ---Yes  Related visit to physician within the last 2 weeks? ---No  Does the PT have any chronic conditions? (i.e. diabetes, asthma, etc.) ---Yes  List chronic conditions. ---heart murmur     Guidelines    Guideline Title Affirmed Question Affirmed Notes  Chest Pain [1] Intermittent chest pain or "angina" AND [2] increasing in severity or frequency (Exception: pains lasting a few seconds)    Final Disposition User   Go to ED Now Wynetta Emery, RN, [facility] - ED

## 2015-05-28 NOTE — ED Notes (Signed)
Pt is declining nitro at this time.

## 2015-05-28 NOTE — ED Notes (Signed)
Pt reports central chest pain that has been present x2 days.  Pain is intermittent and is described as a sharp radiating pain.  Pain radiates across chest.  Pt reports nausea when pain hits and overall fatigue/weakness.

## 2015-05-28 NOTE — Progress Notes (Signed)
This encounter was created in error - please disregard.

## 2015-05-30 ENCOUNTER — Encounter: Payer: Self-pay | Admitting: Physician Assistant

## 2015-05-30 ENCOUNTER — Ambulatory Visit (INDEPENDENT_AMBULATORY_CARE_PROVIDER_SITE_OTHER): Payer: 59 | Admitting: Physician Assistant

## 2015-05-30 ENCOUNTER — Telehealth: Payer: Self-pay | Admitting: Cardiovascular Disease

## 2015-05-30 VITALS — BP 138/64 | HR 67 | Temp 98.2°F | Resp 16 | Ht 66.0 in | Wt 160.5 lb

## 2015-05-30 DIAGNOSIS — B9689 Other specified bacterial agents as the cause of diseases classified elsewhere: Secondary | ICD-10-CM

## 2015-05-30 DIAGNOSIS — R109 Unspecified abdominal pain: Secondary | ICD-10-CM | POA: Diagnosis not present

## 2015-05-30 DIAGNOSIS — J019 Acute sinusitis, unspecified: Secondary | ICD-10-CM | POA: Diagnosis not present

## 2015-05-30 DIAGNOSIS — R0789 Other chest pain: Secondary | ICD-10-CM

## 2015-05-30 LAB — BASIC METABOLIC PANEL
BUN: 7 mg/dL (ref 6–23)
CALCIUM: 9.4 mg/dL (ref 8.4–10.5)
CHLORIDE: 104 meq/L (ref 96–112)
CO2: 29 mEq/L (ref 19–32)
CREATININE: 0.61 mg/dL (ref 0.40–1.20)
GFR: 104.66 mL/min (ref >60.00)
Glucose, Bld: 97 mg/dL (ref 70–99)
Potassium: 3.8 mEq/L (ref 3.5–5.1)
Sodium: 140 mEq/L (ref 135–145)

## 2015-05-30 LAB — POCT URINALYSIS DIPSTICK
Bilirubin, UA: NEGATIVE
Blood, UA: NEGATIVE
GLUCOSE UA: NEGATIVE
KETONES UA: NEGATIVE
Nitrite, UA: NEGATIVE
Protein, UA: NEGATIVE
SPEC GRAV UA: 1.025
Urobilinogen, UA: 0.2
pH, UA: 6

## 2015-05-30 MED ORDER — DOXYCYCLINE HYCLATE 100 MG PO CAPS: 100.0000 mg | ORAL_CAPSULE | Freq: Two times a day (BID) | ORAL | Status: DC

## 2015-05-30 MED ORDER — ONDANSETRON HCL 4 MG PO TABS: 4.0000 mg | ORAL_TABLET | Freq: Three times a day (TID) | ORAL | Status: DC | PRN

## 2015-05-30 MED ORDER — BENZONATATE 100 MG PO CAPS: 100.0000 mg | ORAL_CAPSULE | Freq: Two times a day (BID) | ORAL | Status: DC | PRN

## 2015-05-30 NOTE — Assessment & Plan Note (Signed)
Rx Doxycycline.  Increase fluids.  Rest.  Saline nasal spray.  Probiotic.  Mucinex as directed.  Humidifier in bedroom. Tessalon for cough.  Call or return to clinic if symptoms are not improving.  

## 2015-05-30 NOTE — Assessment & Plan Note (Signed)
Concern for stone. Urine dip with 1+ LE. Otherwise unremarkable. Will send for culture and obtain BMP and CT renal stone study to assess. Rx zofran for nausea.

## 2015-05-30 NOTE — Progress Notes (Signed)
Pre visit review using our clinic review tool, if applicable. No additional management support is needed unless otherwise documented below in the visit note/SLS  

## 2015-05-30 NOTE — Telephone Encounter (Signed)
New Message  Pt c/o BP issue:  1. What are your last 5 BP readings? Pt states that it was documented at the ER. She does not have those reading with her.   2. Are you having any other symptoms (ex. Dizziness, headache, blurred vision, passed out)? Dizziness 3. What is your medication issue? No sure   Comments: Pt called states that she recently went to the ER on Monday. Pt states her BP is fluctuating. No readings listed other than 179/88 from Monday 05/28/2015 Morning. Pt states it got better but last night 05/29/2015 she had a really bad dizzy spell. Per pt her pulse is staying in the 60's during the day. Pt states that she also has a leaking heart valve and heart murmur. Also sick with Sinus and intestinal infection.   Req a call back from the nurse to determine how to move forward.

## 2015-05-30 NOTE — Progress Notes (Signed)
Patient presents to clinic today for ER follow-up of atypical chest pain. Patient also with other acute concerns today.  Patient seen in ER on 05/28/2015 with complaints of central chest pressure/pain that had been present for 3-4 days. EKG and CXR negative. Troponin negative. Patient discharged to follow-up with PCP and Cardiology. Patient denies recurrence of chest pain since ER visit. Has not scheduled follow-up with her Cardiologist, Dr. Burt Knack, as of yet. Is taking BP medications as directed and endorses good home BP measurements.  Patient denies chest pain, palpitations, lightheadedness, dizziness, vision changes or frequent headaches.  BP Readings from Last 3 Encounters:  05/30/15 138/64  05/28/15 147/66  05/09/15 120/76   Patient c/o intermittent R flank pain associated with nausea and reddish-brown urine. States has been going on for several months. Denies urinary urgency, frequency, or dysuria. Denies hx of stone or CKD. Is staying well hydrated.   Patient also c/o 6 weeks of sinus pressure, sinus pain, ear pain with copious thick, colored drainage. Denies fever, chills or tooth pain. Endorses fatigue from chronic dry cough. Recent CXR in ER negative for acute process.  Past Medical History  Diagnosis Date  . GERD (gastroesophageal reflux disease)   . Depression   . Anxiety   . Insomnia   . IBS (irritable bowel syndrome)   . Diverticulosis   . Barrett esophagus   . Hiatal hernia   . Unspecified menopausal and postmenopausal disorder   . Vitamin B12 deficiency   . Blind loop syndrome   . Hepatic cyst   . Unspecified hypothyroidism   . Diarrhea   . Family history of malignant neoplasm of gastrointestinal tract     multiple members  . Endometrial hyperplasia 02/27/2006    BENIGN ENDO BX ON 02/2007  . C. difficile diarrhea   . Cystocele, midline   . Rectocele   . Uterine prolapse without mention of vaginal wall prolapse   . Chest pain, atypical 07/14/2011  . Leaky  heart valve   . Hiatal hernia   . 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 Thrush 07/21/2011  . Rectal bleeding 10/10/2011  . Sinusitis acute 02/03/2012  . Contact dermatitis 03/23/2012  . Headache(784.0) 04/15/2012  . 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 Hematuria 04/27/2012  . Allergic state 12/31/2012  . 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 Paronychia of great toe, left 06/19/2013  . Low back pain 08/15/2013  . Osteoporosis 05/2011    T score-2.5 Lt femoral neck 2012, T score -2.4 2016 FRAX 12%/2.4%  . 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 Hypertension   . Asthma   . 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 Preventative health care 09/11/2014  . Hyperlipidemia, mixed 12/17/2014  . Colon polyp   . Headache 04/16/2015    Current Outpatient Prescriptions on File Prior to Visit  Medication Sig Dispense Refill  . alprazolam (XANAX) 2 MG tablet Take 1 tablet (2 mg total) by mouth 2 (two) times daily as needed. 60 tablet 1  . carvedilol (COREG) 12.5 MG tablet Take 1 tablet (12.5 mg total) by mouth 2 (two) times daily. 180 tablet 3  . cyclobenzaprine (FLEXERIL) 10 MG tablet Take 1 tablet (10 mg total) by mouth 3 (three) times daily as needed for muscle spasms. 40 tablet 1  . dicyclomine (BENTYL) 10 MG capsule Take 1 capsule (10 mg total) by mouth 3 (three) times daily before meals. 30 capsule 0  . diltiazem (CARTIA XT) 180 MG 24 hr capsule Take 1 capsule (180 mg total) by mouth  daily. 90 capsule 1  . fluticasone (FLONASE) 50 MCG/ACT nasal spray USE 2 SPRAYS IN EACH NOSTRIL EVERY DAY 16 g 2  . HYDROcodone-acetaminophen (NORCO) 10-325 MG tablet Take 1 tablet by mouth every 8 (eight) hours as needed. 65 tablet 0  . hyoscyamine (LEVBID) 0.375 MG 12 hr tablet TAKE 2 TABLETS BY MOUTH TWICE DAILY AS NEEDED 360 tablet 2  . lactobacillus acidophilus (BACID) TABS tablet Take 2 tablets by mouth 3 (three) times daily.    . Linaclotide (LINZESS) 145 MCG CAPS capsule Take 145 mcg by mouth daily.    Marland Kitchen omeprazole (PRILOSEC) 40 MG  capsule Take 1 capsule by mouth 2 times daily before meals.  Take 30 min before breakfast and dinner.    . ondansetron (ZOFRAN-ODT) 4 MG disintegrating tablet Take 1 tablet (4 mg total) by mouth every 8 (eight) hours as needed for nausea or vomiting. 30 tablet 0  . promethazine (PHENERGAN) 25 MG tablet TAKE 1 TABLET BY MOUTH THREE TIMES DAILY AS NEEDED FOR NAUSEA 30 tablet 0  . ranitidine (ZANTAC) 300 MG tablet TAKE 1 TABLET BY MOUTH AT BEDTIME. 90 tablet 1  . rifaximin (XIFAXAN) 550 MG TABS tablet Take 550 mg by mouth 2 (two) times daily as needed.     . VENTOLIN HFA 108 (90 BASE) MCG/ACT inhaler INHALE 2 PUFFS BY MOUTH EVERY 6 HOURS AS NEEDED 18 each 1   No current facility-administered medications on file prior to visit.    Allergies  Allergen Reactions  . Sulfamethoxazole Shortness Of Breath    chest tightness  . Amitriptyline Anxiety and Other (See Comments)    Depression, insomnia  . Augmentin [Amoxicillin-Pot Clavulanate] Diarrhea  . Iohexol   . Iodinated Diagnostic Agents     Pt states at Dr.Grapeys office 15 years ago blacked out for 2 hours during injection for IVP. Was told never to have IV dye again.     Family History  Problem Relation Age of Onset  . Colon cancer Mother   . Diabetes Mother   . Hypertension Mother   . Cancer Mother     colon  . Colon polyps Father   . Parkinson's disease Father   . Colon cancer Maternal Aunt   . Colon cancer Maternal Grandmother   . Breast cancer Maternal Grandmother 72  . Diabetes Paternal Grandmother   . Breast cancer Paternal Grandmother 87  . Colon cancer Cousin     X3  . Ovarian cancer Cousin   . Allergies Sister   . Arthritis Brother     b/l hip replace  . Alcohol abuse Daughter   . Arthritis Son   . Hypertension Son   . Allergies Son   . Osteoporosis Son   . Osteoporosis Son   . Arthritis Son     back disease, DDD  . Allergies Son   . Arthritis Son   . Other Son     RSD, IBS  . Hypertension Paternal  Grandfather   . Heart disease Paternal Grandfather     aneurysm    Social History   Social History  . Marital Status: Married    Spouse Name: N/A  . Number of Children: 4  . Years of Education: N/A   Occupational History  . Retired    Social History Main Topics  . Smoking status: Former Smoker    Quit date: 08/19/1995  . Smokeless tobacco: Never Used  . Alcohol Use: No  . Drug Use: No  . Sexual Activity: Not Currently  Birth Control/ Protection: Post-menopausal     Comment: 1st intercourse 55 yo-5 partners   Other Topics Concern  . None   Social History Narrative    Review of Systems - See HPI.  All other ROS are negative.  BP 138/64 mmHg  Pulse 67  Temp(Src) 98.2 F (36.8 C) (Oral)  Resp 16  Ht _0  (1.676 m)  Wt 160 lb 8 oz (72.802 kg)  BMI 25.92 kg/m2  SpO2 100%  Physical Exam  Constitutional: She is oriented to person, place, and time and well-developed, well-nourished, and in no distress.  HENT:  Head: Normocephalic and atraumatic.  Right Ear: External ear normal.  Left Ear: External ear normal.  Mouth/Throat: Oropharynx is clear and moist.  + TTP of sinuses. Thick PND seen in posterior oropharynx.  Eyes: Conjunctivae are normal.  Neck: Neck supple.  Cardiovascular: Normal rate, regular rhythm, normal heart sounds and intact distal pulses.   Pulmonary/Chest: Effort normal and breath sounds normal. No respiratory distress. She has no wheezes. She has no rales. She exhibits no tenderness.  Abdominal: Soft. Bowel sounds are normal. She exhibits no distension and no mass. There is no tenderness. There is no rebound and no guarding.  Negative CVA tenderness.  Lymphadenopathy:    She has no cervical adenopathy.  Neurological: She is alert and oriented to person, place, and time.  Skin: Skin is warm and dry. No rash noted.  Psychiatric: Affect normal.  Vitals reviewed.   Recent Results (from the past 2160 hour(s))  CBC     Status: None   Collection  Time: 04/16/15  9:19 AM  Result Value Ref Range   WBC 7.6 4.0 - 10.5 K/uL   RBC 4.57 3.87 - 5.11 Mil/uL   Platelets 307.0 150.0 - 400.0 K/uL   Hemoglobin 14.3 12.0 - 15.0 g/dL   HCT 42.6 36.0 - 46.0 %   MCV 93.1 78.0 - 100.0 fl   MCHC 33.5 30.0 - 36.0 g/dL   RDW 12.6 11.5 - 15.5 %  Comprehensive metabolic panel     Status: None   Collection Time: 04/16/15  9:19 AM  Result Value Ref Range   Sodium 140 135 - 145 mEq/L   Potassium 4.0 3.5 - 5.1 mEq/L   Chloride 102 96 - 112 mEq/L   CO2 29 19 - 32 mEq/L   Glucose, Bld 95 70 - 99 mg/dL   BUN 7 6 - 23 mg/dL   Creatinine, Ser 0.66 0.40 - 1.20 mg/dL   Total Bilirubin 0.4 0.2 - 1.2 mg/dL   Alkaline Phosphatase 96 39 - 117 U/L   AST 17 0 - 37 U/L   ALT 15 0 - 35 U/L   Total Protein 7.7 6.0 - 8.3 g/dL   Albumin 4.3 3.5 - 5.2 g/dL   Calcium 9.9 8.4 - 10.5 mg/dL   GFR 95.60 >60.00 mL/min  Hemoglobin A1c     Status: None   Collection Time: 04/16/15  9:19 AM  Result Value Ref Range   Hgb A1c MFr Bld 5.7 4.6 - 6.5 %    Comment: Glycemic Control Guidelines for People with Diabetes:Non Diabetic:  <6%Goal of Therapy: <7%Additional Action Suggested:  >8%   Lipid panel     Status: Abnormal   Collection Time: 04/16/15  9:19 AM  Result Value Ref Range   Cholesterol 215 (H) 0 - 200 mg/dL    Comment: ATP III Classification       Desirable:  < 200 mg/dL  Borderline High:  200 - 239 mg/dL          High:  > = 240 mg/dL   Triglycerides 134.0 0.0 - 149.0 mg/dL    Comment: Normal:  <150 mg/dLBorderline High:  150 - 199 mg/dL   HDL 45.70 >39.00 mg/dL   VLDL 26.8 0.0 - 40.0 mg/dL   LDL Cholesterol 142 (H) 0 - 99 mg/dL   Total CHOL/HDL Ratio 5     Comment:                Men          Women1/2 Average Risk     3.4          3.3Average Risk          5.0          4.42X Average Risk          9.6          7.13X Average Risk          15.0          11.0                       NonHDL 168.98     Comment: NOTE:  Non-HDL goal should be 30 mg/dL higher  than patient's LDL goal (i.e. LDL goal of < 70 mg/dL, would have non-HDL goal of < 100 mg/dL)  TSH     Status: None   Collection Time: 04/16/15  9:19 AM  Result Value Ref Range   TSH 1.48 0.35 - 4.50 uIU/mL  Vitamin B12     Status: Abnormal   Collection Time: 04/16/15  9:19 AM  Result Value Ref Range   Vitamin B-12 >1500 (H) 211 - 911 pg/mL  Sed Rate (ESR)     Status: None   Collection Time: 04/16/15  9:19 AM  Result Value Ref Range   Sed Rate 6 0 - 22 mm/hr  CBC w/Diff     Status: None   Collection Time: 05/09/15  1:48 PM  Result Value Ref Range   WBC 7.5 4.0 - 10.5 K/uL   RBC 4.26 3.87 - 5.11 Mil/uL   Hemoglobin 13.2 12.0 - 15.0 g/dL   HCT 39.8 36.0 - 46.0 %   MCV 93.6 78.0 - 100.0 fl   MCHC 33.2 30.0 - 36.0 g/dL   RDW 13.1 11.5 - 15.5 %   Platelets 318.0 150.0 - 400.0 K/uL   Neutrophils Relative % 54.3 43.0 - 77.0 %   Lymphocytes Relative 33.5 12.0 - 46.0 %   Monocytes Relative 9.9 3.0 - 12.0 %   Eosinophils Relative 1.9 0.0 - 5.0 %   Basophils Relative 0.4 0.0 - 3.0 %   Neutro Abs 4.1 1.4 - 7.7 K/uL   Lymphs Abs 2.5 0.7 - 4.0 K/uL   Monocytes Absolute 0.7 0.1 - 1.0 K/uL   Eosinophils Absolute 0.1 0.0 - 0.7 K/uL   Basophils Absolute 0.0 0.0 - 0.1 K/uL  Comprehensive metabolic panel     Status: Abnormal   Collection Time: 05/09/15  1:48 PM  Result Value Ref Range   Sodium 142 135 - 145 mEq/L   Potassium 4.1 3.5 - 5.1 mEq/L   Chloride 105 96 - 112 mEq/L   CO2 33 (H) 19 - 32 mEq/L   Glucose, Bld 88 70 - 99 mg/dL   BUN 7 6 - 23 mg/dL   Creatinine, Ser 0.58 0.40 - 1.20 mg/dL   Total  Bilirubin 0.3 0.2 - 1.2 mg/dL   Alkaline Phosphatase 83 39 - 117 U/L   AST 15 0 - 37 U/L   ALT 16 0 - 35 U/L   Total Protein 7.3 6.0 - 8.3 g/dL   Albumin 4.1 3.5 - 5.2 g/dL   Calcium 9.2 8.4 - 10.5 mg/dL   GFR 110.96 >60.00 mL/min  Amylase     Status: None   Collection Time: 05/09/15  1:48 PM  Result Value Ref Range   Amylase 31 27 - 131 U/L  Lipase     Status: None   Collection  Time: 05/09/15  1:48 PM  Result Value Ref Range   Lipase 12.0 11.0 - 59.0 U/L  POCT urinalysis dipstick     Status: Abnormal   Collection Time: 05/09/15  2:32 PM  Result Value Ref Range   Color, UA light yellow    Clarity, UA clear    Glucose, UA neg    Bilirubin, UA neg    Ketones, UA neg    Spec Grav, UA <=1.005    Blood, UA neg    pH, UA 6.5    Protein, UA neg    Urobilinogen, UA 0.2    Nitrite, UA neg    Leukocytes, UA small (1+) (A) Negative  Urine culture     Status: None   Collection Time: 05/09/15  3:17 PM  Result Value Ref Range   Colony Count NO GROWTH    Organism ID, Bacteria NO GROWTH   Basic metabolic panel     Status: Abnormal   Collection Time: 05/28/15  9:40 AM  Result Value Ref Range   Sodium 140 135 - 145 mmol/L   Potassium 4.0 3.5 - 5.1 mmol/L   Chloride 106 101 - 111 mmol/L   CO2 25 22 - 32 mmol/L   Glucose, Bld 109 (H) 65 - 99 mg/dL   BUN 5 (L) 6 - 20 mg/dL   Creatinine, Ser 0.67 0.44 - 1.00 mg/dL   Calcium 9.4 8.9 - 10.3 mg/dL   GFR calc non Af Amer >60 >60 mL/min   GFR calc Af Amer >60 >60 mL/min    Comment: (NOTE) The eGFR has been calculated using the CKD EPI equation. This calculation has not been validated in all clinical situations. eGFR's persistently <60 mL/min signify possible Chronic Kidney Disease.    Anion gap 9 5 - 15  CBC     Status: None   Collection Time: 05/28/15  9:40 AM  Result Value Ref Range   WBC 7.9 4.0 - 10.5 K/uL   RBC 4.23 3.87 - 5.11 MIL/uL   Hemoglobin 13.1 12.0 - 15.0 g/dL   HCT 39.5 36.0 - 46.0 %   MCV 93.4 78.0 - 100.0 fL   MCH 31.0 26.0 - 34.0 pg   MCHC 33.2 30.0 - 36.0 g/dL   RDW 12.6 11.5 - 15.5 %   Platelets 283 150 - 400 K/uL  I-stat troponin, ED     Status: None   Collection Time: 05/28/15 10:07 AM  Result Value Ref Range   Troponin i, poc 0.00 0.00 - 0.08 ng/mL   Comment 3            Comment: Due to the release kinetics of cTnI, a negative result within the first hours of the onset of symptoms  does not rule out myocardial infarction with certainty. If myocardial infarction is still suspected, repeat the test at appropriate intervals.   Urinalysis, Routine w reflex microscopic  Status: None   Collection Time: 05/28/15 11:13 AM  Result Value Ref Range   Color, Urine YELLOW YELLOW   APPearance CLEAR CLEAR   Specific Gravity, Urine 1.007 1.005 - 1.030   pH 7.5 5.0 - 8.0   Glucose, UA NEGATIVE NEGATIVE mg/dL   Hgb urine dipstick NEGATIVE NEGATIVE   Bilirubin Urine NEGATIVE NEGATIVE   Ketones, ur NEGATIVE NEGATIVE mg/dL   Protein, ur NEGATIVE NEGATIVE mg/dL   Urobilinogen, UA 0.2 0.0 - 1.0 mg/dL   Nitrite NEGATIVE NEGATIVE   Leukocytes, UA NEGATIVE NEGATIVE    Comment: MICROSCOPIC NOT DONE ON URINES WITH NEGATIVE PROTEIN, BLOOD, LEUKOCYTES, NITRITE, OR GLUCOSE <1000 mg/dL.  I-stat troponin, ED     Status: None   Collection Time: 05/28/15  1:18 PM  Result Value Ref Range   Troponin i, poc 0.00 0.00 - 0.08 ng/mL   Comment 3            Comment: Due to the release kinetics of cTnI, a negative result within the first hours of the onset of symptoms does not rule out myocardial infarction with certainty. If myocardial infarction is still suspected, repeat the test at appropriate intervals.     Assessment/Plan: Acute bacterial sinusitis Rx Doxycycline.  Increase fluids.  Rest.  Saline nasal spray.  Probiotic.  Mucinex as directed.  Humidifier in bedroom. Tessalon for cough.  Call or return to clinic if symptoms are not improving.   Atypical chest pain ER workup negative. Symptoms have resolved. BP stable in clinic. Continue anti-hypertensive regimen. Patient to schedule ER follow-up with Cardiology giving history. Alarm signs/symptoms discussed with patient that would prompt ER visit.  Right flank pain Concern for stone. Urine dip with 1+ LE. Otherwise unremarkable. Will send for culture and obtain BMP and CT renal stone study to assess. Rx zofran for nausea.

## 2015-05-30 NOTE — Patient Instructions (Signed)
Please go to the lab for blood work. I will call with results.  You will also be contacted for a CT scan to assess the R flank pain you are having. Stay well hydrated and use Zofran as directed if needed for nausea.  Call Dr. Burt Knack (Cardiologist) to schedule an ER follow-up for chest pain, even though you are asymptomatic you need to follow-up with specialist giving your medical history. Continue BP medications as directed.  For Sinuses: Please take antibiotic as directed.  Increase fluid intake.  Use Saline nasal spray.  Take a daily multivitamin. Use Tessalon for cough and continue plain Mucinex.  Place a humidifier in the bedroom.  Please call or return clinic if symptoms are not improving.  Sinusitis Sinusitis is redness, soreness, and swelling (inflammation) of the paranasal sinuses. Paranasal sinuses are air pockets within the bones of your face (beneath the eyes, the middle of the forehead, or above the eyes). In healthy paranasal sinuses, mucus is able to drain out, and air is able to circulate through them by way of your nose. However, when your paranasal sinuses are inflamed, mucus and air can become trapped. This can allow bacteria and other germs to grow and cause infection. Sinusitis can develop quickly and last only a short time (acute) or continue over a long period (chronic). Sinusitis that lasts for more than 12 weeks is considered chronic.  CAUSES  Causes of sinusitis include:  Allergies.  Structural abnormalities, such as displacement of the cartilage that separates your nostrils (deviated septum), which can decrease the air flow through your nose and sinuses and affect sinus drainage.  Functional abnormalities, such as when the small hairs (cilia) that line your sinuses and help remove mucus do not work properly or are not present. SYMPTOMS  Symptoms of acute and chronic sinusitis are the same. The primary symptoms are pain and pressure around the affected sinuses. Other  symptoms include:  Upper toothache.  Earache.  Headache.  Bad breath.  Decreased sense of smell and taste.  A cough, which worsens when you are lying flat.  Fatigue.  Fever.  Thick drainage from your nose, which often is green and may contain pus (purulent).  Swelling and warmth over the affected sinuses. DIAGNOSIS  Your caregiver will perform a physical exam. During the exam, your caregiver may:  Look in your nose for signs of abnormal growths in your nostrils (nasal polyps).  Tap over the affected sinus to check for signs of infection.  View the inside of your sinuses (endoscopy) with a special imaging device with a light attached (endoscope), which is inserted into your sinuses. If your caregiver suspects that you have chronic sinusitis, one or more of the following tests may be recommended:  Allergy tests.  Nasal culture A sample of mucus is taken from your nose and sent to a lab and screened for bacteria.  Nasal cytology A sample of mucus is taken from your nose and examined by your caregiver to determine if your sinusitis is related to an allergy. TREATMENT  Most cases of acute sinusitis are related to a viral infection and will resolve on their own within 10 days. Sometimes medicines are prescribed to help relieve symptoms (pain medicine, decongestants, nasal steroid sprays, or saline sprays).  However, for sinusitis related to a bacterial infection, your caregiver will prescribe antibiotic medicines. These are medicines that will help kill the bacteria causing the infection.  Rarely, sinusitis is caused by a fungal infection. In theses cases, your caregiver will  prescribe antifungal medicine. For some cases of chronic sinusitis, surgery is needed. Generally, these are cases in which sinusitis recurs more than 3 times per year, despite other treatments. HOME CARE INSTRUCTIONS   Drink plenty of water. Water helps thin the mucus so your sinuses can drain more  easily.  Use a humidifier.  Inhale steam 3 to 4 times a day (for example, sit in the bathroom with the shower running).  Apply a warm, moist washcloth to your face 3 to 4 times a day, or as directed by your caregiver.  Use saline nasal sprays to help moisten and clean your sinuses.  Take over-the-counter or prescription medicines for pain, discomfort, or fever only as directed by your caregiver. SEEK IMMEDIATE MEDICAL CARE IF:  You have increasing pain or severe headaches.  You have nausea, vomiting, or drowsiness.  You have swelling around your face.  You have vision problems.  You have a stiff neck.  You have difficulty breathing. MAKE SURE YOU:   Understand these instructions.  Will watch your condition.  Will get help right away if you are not doing well or get worse. Document Released: 08/04/2005 Document Revised: 10/27/2011 Document Reviewed: 08/19/2011 [facility] Patient Information 2014 [place], [place].

## 2015-05-30 NOTE — Telephone Encounter (Signed)
Pt last evaluated in our office 04/27/13. I spoke with the pt and her BP at [facility] today was okay.  I could not find documentation of ER BP 179/88.  The PCP is treating the pt's sinusitis and I made her aware that this could be causing dizziness. I have scheduled the pt to follow-up in our office on 06/04/15.  Pt agreed with plan.

## 2015-05-30 NOTE — Assessment & Plan Note (Signed)
ER workup negative. Symptoms have resolved. BP stable in clinic. Continue anti-hypertensive regimen. Patient to schedule ER follow-up with Cardiology giving history. Alarm signs/symptoms discussed with patient that would prompt ER visit.

## 2015-05-31 ENCOUNTER — Other Ambulatory Visit: Payer: Self-pay | Admitting: *Deleted

## 2015-05-31 ENCOUNTER — Other Ambulatory Visit: Payer: Self-pay | Admitting: Physician Assistant

## 2015-05-31 DIAGNOSIS — R109 Unspecified abdominal pain: Secondary | ICD-10-CM

## 2015-05-31 DIAGNOSIS — G8929 Other chronic pain: Secondary | ICD-10-CM

## 2015-05-31 LAB — CULTURE, URINE COMPREHENSIVE
Colony Count: NO GROWTH
Organism ID, Bacteria: NO GROWTH

## 2015-05-31 MED ORDER — VENLAFAXINE HCL ER 75 MG PO CP24: 75.0000 mg | ORAL_CAPSULE | Freq: Every day | ORAL | Status: DC

## 2015-06-01 ENCOUNTER — Ambulatory Visit (HOSPITAL_BASED_OUTPATIENT_CLINIC_OR_DEPARTMENT_OTHER)
Admission: RE | Admit: 2015-06-01 | Discharge: 2015-06-01 | Disposition: A | Discharge status: Home / Self Care | Payer: 59 | Source: Ambulatory Visit | Attending: Physician Assistant | Admitting: Physician Assistant

## 2015-06-01 ENCOUNTER — Telehealth: Payer: Self-pay | Admitting: Family Medicine

## 2015-06-01 ENCOUNTER — Encounter: Payer: Self-pay | Admitting: *Deleted

## 2015-06-01 DIAGNOSIS — R109 Unspecified abdominal pain: Secondary | ICD-10-CM | POA: Insufficient documentation

## 2015-06-01 DIAGNOSIS — I7789 Other specified disorders of arteries and arterioles: Secondary | ICD-10-CM | POA: Insufficient documentation

## 2015-06-01 DIAGNOSIS — K449 Diaphragmatic hernia without obstruction or gangrene: Secondary | ICD-10-CM | POA: Insufficient documentation

## 2015-06-01 NOTE — Telephone Encounter (Signed)
Pt called stating that imaging told her they didn't know if they had the correct order for CT scan. Pt said that pain if from upper stomach under ribs to low stomach bladder area. Please verify orders in system for CT today 10/14 10:30am at [place].

## 2015-06-01 NOTE — Telephone Encounter (Signed)
Orders are correct per discussion with [facility]. She needs to go to her appointment. Please call and inform her

## 2015-06-01 NOTE — Telephone Encounter (Signed)
Pt was informed to go when she called earlier. Thanks.

## 2015-06-04 ENCOUNTER — Encounter: Payer: Self-pay | Admitting: Physician Assistant

## 2015-06-04 ENCOUNTER — Ambulatory Visit (INDEPENDENT_AMBULATORY_CARE_PROVIDER_SITE_OTHER): Payer: 59 | Admitting: Physician Assistant

## 2015-06-04 ENCOUNTER — Other Ambulatory Visit: Payer: Self-pay | Admitting: Physician Assistant

## 2015-06-04 VITALS — BP 142/72 | HR 75 | Ht 66.0 in | Wt 160.8 lb

## 2015-06-04 DIAGNOSIS — R0789 Other chest pain: Secondary | ICD-10-CM

## 2015-06-04 DIAGNOSIS — R079 Chest pain, unspecified: Secondary | ICD-10-CM | POA: Insufficient documentation

## 2015-06-04 DIAGNOSIS — I10 Essential (primary) hypertension: Secondary | ICD-10-CM

## 2015-06-04 DIAGNOSIS — I701 Atherosclerosis of renal artery: Secondary | ICD-10-CM

## 2015-06-04 DIAGNOSIS — I079 Rheumatic tricuspid valve disease, unspecified: Secondary | ICD-10-CM

## 2015-06-04 DIAGNOSIS — R072 Precordial pain: Secondary | ICD-10-CM | POA: Insufficient documentation

## 2015-06-04 DIAGNOSIS — E782 Mixed hyperlipidemia: Secondary | ICD-10-CM

## 2015-06-04 DIAGNOSIS — I071 Rheumatic tricuspid insufficiency: Secondary | ICD-10-CM

## 2015-06-04 NOTE — Progress Notes (Signed)
Cardiology Office Note   Date:  06/04/2015   ID:  Megan [name], DOB [birth year], MRN [id number]  PCP:  Penni Homans, MD  Cardiologist: Dr. Burt Knack  Chief Complaint: dizziness, elevated blood pressure    History of Present Illness: Megan [name] is a 64 y.o. female who presents for  High blood pressure. The patient was last seen in our office in 2014 by Dr. Burt Knack. She's been followed for chest pain, palpitations, moderate tricuspid regurgitation. 2-D echo showed normal LV function EF 55-60% with mildly dilated LA and RA and moderate tricuspid regurgitation. She has stress echocardiogram that was normal. Her chest pain has been atypical.   Patient was in the ED on 05/28/15 with central chest pain present for 3 days. EKG showed normal sinus rhythm with incomplete right bundle branch block unchanged since 2011. troponins were negative 2. Pain felt worse when she laid down. She was advised to follow-up with cardiology. BP was also elevated in the setting of a sinus infection. Antibiotics dictator stomach. CT of abdomen and pelvis was performed to rule out kidney stone. There was calcified plaque in the origin of the left renal artery which could potentially cause hemodynamically significant stenosis.  Being treated by Dr. Donivan Scull at [facility] for Barrett's Esophagus, diverticulitis, reflux.  Patient says chest felt like she was kicked by a horse. She was also sweaty. No further chest pain but significant GI symptoms. A lot of reflux and pain when she lays down at night. She has multiple complaints of back flank pain , fatigue. She says her pharmacy changed her diltiazem and she thinks this may be the reason her blood pressures been elevated.    Past Medical History  Diagnosis Date  . GERD (gastroesophageal reflux disease)   . Depression   . Anxiety   . Insomnia   . IBS (irritable bowel syndrome)   . Diverticulosis   . Barrett esophagus   . Hiatal hernia   . Unspecified  menopausal and postmenopausal disorder   . Vitamin B12 deficiency   . Blind loop syndrome   . Hepatic cyst   . Unspecified hypothyroidism   . Diarrhea   . Family history of malignant neoplasm of gastrointestinal tract     multiple members  . Endometrial hyperplasia 02/27/2006    BENIGN ENDO BX ON 02/2007  . C. difficile diarrhea   . Cystocele, midline   . Rectocele   . Uterine prolapse without mention of vaginal wall prolapse   . Chest pain, atypical 07/14/2011  . Leaky heart valve   . Hiatal hernia   . 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 Thrush 07/21/2011  . Rectal bleeding 10/10/2011  . Sinusitis acute 02/03/2012  . Contact dermatitis 03/23/2012  . Headache(784.0) 04/15/2012  . 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 Hematuria 04/27/2012  . Allergic state 12/31/2012  . 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 Paronychia of great toe, left 06/19/2013  . Low back pain 08/15/2013  . Osteoporosis 05/2011    T score-2.5 Lt femoral neck 2012, T score -2.4 2016 FRAX 12%/2.4%  . 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 Hypertension   . Asthma   . 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 Preventative health care 09/11/2014  . Hyperlipidemia, mixed 12/17/2014  . Colon polyp   . Headache 04/16/2015    Past Surgical History  Procedure Laterality Date  . Appendectomy    . Uterine fibroid surgery    . Hysteroscopy      POLYP     Current Outpatient  Prescriptions  Medication Sig Dispense Refill  . ALPRAZolam (XANAX XR) 2 MG 24 hr tablet Take 2 mg by mouth every morning. For stress    . aspirin 81 MG tablet Take 81 mg by mouth daily.    . carvedilol (COREG) 12.5 MG tablet Take 1 tablet (12.5 mg total) by mouth 2 (two) times daily. 180 tablet 3  . dicyclomine (BENTYL) 10 MG capsule Take 1 capsule (10 mg total) by mouth 3 (three) times daily before meals. 30 capsule 0  . diltiazem (CARTIA XT) 180 MG 24 hr capsule Take 1 capsule (180 mg total) by mouth daily. 90 capsule 1  . doxycycline (VIBRAMYCIN) 100 MG capsule Take  1 capsule (100 mg total) by mouth 2 (two) times daily. 20 capsule 0  . fluticasone (FLONASE) 50 MCG/ACT nasal spray USE 2 SPRAYS IN EACH NOSTRIL EVERY DAY 16 g 2  . HYDROcodone-acetaminophen (NORCO) 10-325 MG tablet Take 1 tablet by mouth every 8 (eight) hours as needed. 65 tablet 0  . hyoscyamine (LEVBID) 0.375 MG 12 hr tablet TAKE 2 TABLETS BY MOUTH TWICE DAILY AS NEEDED 360 tablet 2  . lactobacillus acidophilus (BACID) TABS tablet Take 2 tablets by mouth 3 (three) times daily.    . Linaclotide (LINZESS) 145 MCG CAPS capsule Take 145 mcg by mouth daily.    Marland Kitchen omeprazole (PRILOSEC) 40 MG capsule Take 1 capsule by mouth 2 times daily before meals.  Take 30 min before breakfast and dinner.    . ondansetron (ZOFRAN) 4 MG tablet Take 1 tablet (4 mg total) by mouth every 8 (eight) hours as needed for nausea or vomiting. 20 tablet 0  . ondansetron (ZOFRAN-ODT) 4 MG disintegrating tablet Take 1 tablet (4 mg total) by mouth every 8 (eight) hours as needed for nausea or vomiting. 30 tablet 0  . promethazine (PHENERGAN) 25 MG tablet TAKE 1 TABLET BY MOUTH THREE TIMES DAILY AS NEEDED FOR NAUSEA 30 tablet 0  . ranitidine (ZANTAC) 300 MG tablet TAKE 1 TABLET BY MOUTH AT BEDTIME. 90 tablet 1  . rifaximin (XIFAXAN) 550 MG TABS tablet Take 550 mg by mouth 2 (two) times daily as needed.     . VENTOLIN HFA 108 (90 BASE) MCG/ACT inhaler INHALE 2 PUFFS BY MOUTH EVERY 6 HOURS AS NEEDED 18 each 1   No current facility-administered medications for this visit.    Allergies:   Sulfamethoxazole; Amitriptyline; Augmentin; Iohexol; and Iodinated diagnostic agents    Social History:  The patient  reports that she quit smoking about 19 years ago. She has never used smokeless tobacco. She reports that she does not drink alcohol or use illicit drugs.   Family History:  The patient's family history includes Alcohol abuse in her daughter; Allergies in her sister, son, and son; Aneurysm in her paternal grandfather; Arthritis in  her brother, son, son, and son; Breast cancer (age of onset: 53) in her paternal grandmother; Breast cancer (age of onset: 43) in her maternal grandmother; Colon cancer in her cousin, maternal aunt, maternal grandmother, mother, and mother; Colon polyps in her father; Diabetes in her mother and paternal grandmother; Heart disease in her paternal grandfather; Hypertension in her mother, paternal grandfather, and son; Irritable bowel syndrome in her son; Osteoporosis in her son and son; Ovarian cancer in her cousin; Parkinson's disease in her father.    ROS:  Please see the history of present illness.   Otherwise, review of systems are positive for  Weight gain, fatigue, hearing loss, visual disturbances, abdominal pain,  nausea vomiting, anxiety depression, chronic back pain, headaches.   All other systems are reviewed and negative.    PHYSICAL EXAM: VS:  BP 142/72 mmHg  Pulse 75  Ht 5\' 6"  (1.676 m)  Wt 160 lb 12.8 oz (72.938 kg)  BMI 25.97 kg/m2 , BMI Body mass index is 25.97 kg/(m^2). GEN: Well nourished, well developed, in no acute distress Neck: no JVD, HJR, carotid bruits, or masses Cardiac:  RRR;  1/6 systolic murmur at left sternal border, nogallop, rubs, thrill or heave,  Respiratory:  clear to auscultation bilaterally, normal work of breathing GI: soft, nontender, nondistended, + BS MS: no deformity or atrophy Extremities: without cyanosis, clubbing, edema, good distal pulses bilaterally.  Skin: warm and dry, no rash Neuro:  Strength and sensation are intact    EKG:  EKG is ordered today. The ekg ordered today demonstrates  Normal sinus rhythm with incomplete right bundle branch block   Recent Labs: 04/16/2015: TSH 1.48 05/09/2015: ALT 16 05/28/2015: Hemoglobin 13.1; Platelets 283 05/30/2015: BUN 7; Creatinine, Ser 0.61; Potassium 3.8; Sodium 140    Lipid Panel    Component Value Date/Time   CHOL 215* 04/16/2015 0919   TRIG 134.0 04/16/2015 0919   HDL 45.70 04/16/2015  0919   CHOLHDL 5 04/16/2015 0919   VLDL 26.8 04/16/2015 0919   LDLCALC 142* 04/16/2015 0919   LDLDIRECT 165.0 06/30/2011 1153      Wt Readings from Last 3 Encounters:  06/04/15 160 lb 12.8 oz (72.938 kg)  05/30/15 160 lb 8 oz (72.802 kg)  05/09/15 163 lb (73.936 kg)      Other studies Reviewed: Additional studies/ records that were reviewed today include and review of the records demonstrates:     CLINICAL DATA:  64 year old with acute onset of right flank pain radiating into the back and into the left side, also radiating into the right groin. No recent injuries. Surgical history includes appendectomy. Personal history of C difficile colitis.   EXAM: CT ABDOMEN AND PELVIS WITHOUT CONTRAST     IMPRESSION: 1. No acute abnormalities involving the abdomen or pelvis. 2. No evidence of urinary tract calculi. 3. Small hiatal hernia. 4. Stable left adrenal nodule dating back to 2012. 5. Calcified plaque at the origin of the left renal artery which could potentially cause hemodynamically significant stenosis.      ASSESSMENT AND PLAN: Essential hypertension  Blood pressures been fluctuating since her diltiazem changed at the pharmacy. CT scan of the abdomen shows calcified plaque in the origin of the left renal artery which could potentially cause hemodynamically significant stenosis. Will order renal ultrasound to rule out renal artery stenosis. Follow-up with Dr. Burt Knack.  Tricuspid regurgitation  Patient has history of moderate TR by 2-D echo 2 years ago. We'll check 2-D echo.  Chest pain  Patient has atypical chest pain. She had negative troponins an EKG in the emergency room. She has a lot of GI symptoms going on as well. Being treated by Dr. Donivan Scull at [facility]. She has had negative stress echo in the past. We'll not workup further at this time.  Hyperlipidemia, mixed  Patient has elevated lipid profile in August 2016. This has not been treated by her primary because of  all her other symptoms. We'll hold off at this time with all her GI symptoms. Review with Dr. Burt Knack in 2 months.     Signed, Ermalinda Barrios, PA-C  06/04/2015 1:37 PM    [place], [place]  [address] Phone: [phone]; Fax: [phone]

## 2015-06-04 NOTE — Telephone Encounter (Signed)
No charge if went to ED.

## 2015-06-04 NOTE — Patient Instructions (Addendum)
Medication Instructions:  Your physician recommends that you continue on your current medications as directed. Please refer to the Current Medication list given to you today.   Labwork: None ordered   Testing/Procedures: Your physician has requested that you have an echocardiogram. Echocardiography is a painless test that uses sound waves to create images of your heart. It provides your doctor with information about the size and shape of your heart and how well your heart's chambers and valves are working. This procedure takes approximately one hour. There are no restrictions for this procedure.  Your physician has requested that you have an Renal Ultrasound.     Follow-Up: Your physician recommends that you schedule a follow-up appointment in: 1-2 MONTHS WITH DR. Burt Knack   Any Other Special Instructions Will Be Listed Below (If Applicable).           Renal Ultrasound Ultrasound uses harmless sound waves instead of X-rays to take pictures of the inside of your body. A probe or wand device (transducer) is held up against your body to capture these pictures. The continually changing images can be recorded on videotape or film. Diagnostic ultrasound imaging is commonly called sonography or ultrasonography. There are different types of ultrasound exams.   RISKS AND COMPLICATIONS Ultrasound has been used for many years and has never shown any harmful effects. Studies in humans have shown no direct link between the use of ultrasound and adverse outcomes. BEFORE THE PROCEDURE Other than drinking water, do not URINATE 1 hour prior to your scheduled time.  Follow any other diet instructions from your caregiver.   PROCEDURE  There is no pain in an ultrasound exam. A gel is applied to your skin, and the transducer is then placed on the area to be examined. The gel may feel cool. The gel wipes off easily, but it is a good idea to wear clothing that is easily washable. The images from inside your  body are displayed on one or more monitors that look like small television screens. The returning sound waves produce pictures of the organs that were in the path of the sound sent from the transducer. The room is usually darkened during the exam. This makes it easier to see the images on the monitor. The ultrasound exam should take less than 1 hour. AFTER THE PROCEDURE You can safely drive home and return to regular activities immediately after the exam. Ask when your test results will be ready. Make sure you get your test results.   This information is not intended to replace advice given to you by your health care provider. Make sure you discuss any questions you have with your health care provider.   Document Released: 08/01/2000 Document Revised: 10/27/2011 Document Reviewed: 01/23/2011 Elsevier Interactive Patient Education 2016 [employer]. Echocardiogram An echocardiogram, or echocardiography, uses sound waves (ultrasound) to produce an image of your heart. The echocardiogram is simple, painless, obtained within a short period of time, and offers valuable information to your health care provider. The images from an echocardiogram can provide information such as:  Evidence of coronary artery disease (CAD).  Heart size.  Heart muscle function.  Heart valve function.  Aneurysm detection.  Evidence of a past heart attack.  Fluid buildup around the heart.  Heart muscle thickening.  Assess heart valve function. LET [facility] CARE PROVIDER KNOW ABOUT:  Any allergies you have.  All medicines you are taking, including vitamins, herbs, eye drops, creams, and over-the-counter medicines.  Previous problems you or members of your  family have had with the use of anesthetics.  Any blood disorders you have.  Previous surgeries you have had.  Medical conditions you have.  Possibility of pregnancy, if this applies. BEFORE THE PROCEDURE  No special preparation is needed. Eat and  drink normally.  PROCEDURE   In order to produce an image of your heart, gel will be applied to your chest and a wand-like tool (transducer) will be moved over your chest. The gel will help transmit the sound waves from the transducer. The sound waves will harmlessly bounce off your heart to allow the heart images to be captured in real-time motion. These images will then be recorded.  You may need an IV to receive a medicine that improves the quality of the pictures. AFTER THE PROCEDURE You may return to your normal schedule including diet, activities, and medicines, unless your health care provider tells you otherwise.   This information is not intended to replace advice given to you by your health care provider. Make sure you discuss any questions you have with your health care provider.   Document Released: 08/01/2000 Document Revised: 08/25/2014 Document Reviewed: 04/11/2013 Elsevier Interactive Patient Education [employer].

## 2015-06-04 NOTE — Telephone Encounter (Signed)
Pt was no show 05/28/15 10:15am, acute appt, per notes pt went to ER this day, will mark for no charge.

## 2015-06-04 NOTE — Addendum Note (Signed)
Addended by: Gaetano Net on: 06/04/2015 02:13 PM   Modules accepted: Orders

## 2015-06-04 NOTE — Assessment & Plan Note (Signed)
Blood pressures been fluctuating since her diltiazem changed at the pharmacy. CT scan of the abdomen shows calcified plaque in the origin of the left renal artery which could potentially cause hemodynamically significant stenosis. Will order renal ultrasound to rule out renal artery stenosis. Follow-up with Dr. Burt Knack.

## 2015-06-04 NOTE — Assessment & Plan Note (Signed)
Patient has atypical chest pain. She had negative troponins an EKG in the emergency room. She has a lot of GI symptoms going on as well. Being treated by Dr. Donivan Scull at [facility]. She has had negative stress echo in the past. We'll not workup further at this time.

## 2015-06-04 NOTE — Assessment & Plan Note (Signed)
Patient has elevated lipid profile in August 2016. This has not been treated by her primary because of all her other symptoms. We'll hold off at this time with all her GI symptoms. Review with Dr. Burt Knack in 2 months.

## 2015-06-04 NOTE — Assessment & Plan Note (Signed)
Patient has history of moderate TR by 2-D echo 2 years ago. We'll check 2-D echo.

## 2015-06-08 ENCOUNTER — Telehealth: Payer: Self-pay | Admitting: Family Medicine

## 2015-06-08 ENCOUNTER — Other Ambulatory Visit: Payer: Self-pay | Admitting: Family Medicine

## 2015-06-08 DIAGNOSIS — H547 Unspecified visual loss: Secondary | ICD-10-CM

## 2015-06-08 NOTE — Telephone Encounter (Signed)
Please refer to Dr. Ralph Dowdy optomologist inside [facility] on [address] for yearly eye exam. Ph# [phone] or [phone]

## 2015-06-08 NOTE — Telephone Encounter (Signed)
Pt called back in to give an updated optomologist to refer her to - Le[facility] on [address]  Phone:  [phone] .

## 2015-06-11 ENCOUNTER — Ambulatory Visit (HOSPITAL_COMMUNITY): Payer: 59 | Attending: Internal Medicine

## 2015-06-11 ENCOUNTER — Other Ambulatory Visit: Payer: Self-pay | Admitting: Family Medicine

## 2015-06-11 ENCOUNTER — Other Ambulatory Visit: Payer: Self-pay

## 2015-06-11 DIAGNOSIS — I340 Nonrheumatic mitral (valve) insufficiency: Secondary | ICD-10-CM | POA: Insufficient documentation

## 2015-06-11 DIAGNOSIS — I071 Rheumatic tricuspid insufficiency: Secondary | ICD-10-CM | POA: Insufficient documentation

## 2015-06-11 DIAGNOSIS — I079 Rheumatic tricuspid valve disease, unspecified: Secondary | ICD-10-CM | POA: Diagnosis not present

## 2015-06-11 DIAGNOSIS — I517 Cardiomegaly: Secondary | ICD-10-CM | POA: Diagnosis not present

## 2015-06-12 ENCOUNTER — Other Ambulatory Visit: Payer: 59

## 2015-06-14 ENCOUNTER — Other Ambulatory Visit: Payer: Self-pay | Admitting: Family Medicine

## 2015-06-14 ENCOUNTER — Other Ambulatory Visit: Payer: Self-pay | Admitting: Physician Assistant

## 2015-06-14 DIAGNOSIS — R109 Unspecified abdominal pain: Secondary | ICD-10-CM

## 2015-06-14 MED ORDER — ONDANSETRON HCL 4 MG PO TABS: 4.0000 mg | ORAL_TABLET | Freq: Three times a day (TID) | ORAL | Status: DC | PRN

## 2015-06-15 ENCOUNTER — Ambulatory Visit: Payer: 59 | Admitting: Cardiovascular Disease

## 2015-06-15 ENCOUNTER — Ambulatory Visit
Admission: RE | Admit: 2015-06-15 | Discharge: 2015-06-15 | Disposition: A | Discharge status: Home / Self Care | Payer: 59 | Source: Ambulatory Visit | Attending: Physician Assistant | Admitting: Physician Assistant

## 2015-06-15 ENCOUNTER — Telehealth: Payer: Self-pay | Admitting: Family Medicine

## 2015-06-15 ENCOUNTER — Other Ambulatory Visit: Payer: Self-pay | Admitting: Family Medicine

## 2015-06-15 ENCOUNTER — Telehealth: Payer: Self-pay

## 2015-06-15 DIAGNOSIS — M545 Low back pain: Secondary | ICD-10-CM

## 2015-06-15 DIAGNOSIS — I701 Atherosclerosis of renal artery: Secondary | ICD-10-CM

## 2015-06-15 DIAGNOSIS — M25579 Pain in unspecified ankle and joints of unspecified foot: Secondary | ICD-10-CM

## 2015-06-15 DIAGNOSIS — R109 Unspecified abdominal pain: Secondary | ICD-10-CM

## 2015-06-15 MED ORDER — CYCLOBENZAPRINE HCL 10 MG PO TABS: 10.0000 mg | ORAL_TABLET | Freq: Three times a day (TID) | ORAL | Status: DC | PRN

## 2015-06-15 MED ORDER — ALPRAZOLAM ER 2 MG PO TB24: 2.0000 mg | ORAL_TABLET | ORAL | Status: DC

## 2015-06-15 MED ORDER — PROMETHAZINE HCL 25 MG PO TABS: ORAL_TABLET | ORAL | Status: DC

## 2015-06-15 MED ORDER — ONDANSETRON HCL 4 MG PO TABS: 4.0000 mg | ORAL_TABLET | Freq: Three times a day (TID) | ORAL | Status: DC | PRN

## 2015-06-15 NOTE — Telephone Encounter (Signed)
Relation to AU:[id number] Call back number: [phone]    Reason for call:   Patient wanted to inform PA she had her U/S done and wanted to mention she is in pain and it hurts when she moves in need of clinical advice.

## 2015-06-15 NOTE — Telephone Encounter (Signed)
Printed alprazolam and on counter for signature.  Will fax to pharmacy [facility] [address]

## 2015-06-15 NOTE — Telephone Encounter (Signed)
Patient states she has had severe pain today in her lower back on right . She has been unable to walk,standor sit. Has.chills and sttates she has had urinary frequency. Says she had U/S today and is waiting on results.

## 2015-06-15 NOTE — Telephone Encounter (Addendum)
Called patient with  [facility] [employer] response to c/o pain.>

## 2015-06-15 NOTE — Telephone Encounter (Signed)
Caller name:Mr. Garmon  Relation to XN:[id number]  Call back number:[phone] Pharmacy:  Reason for call:  Spouse is requesting a refill HYDROcodone-acetaminophen (NORCO) 10-325 MG tablet. Spouse states patient insurance will change and would like to pick up Monday. Please advise

## 2015-06-15 NOTE — Telephone Encounter (Signed)
Called patient per Megan [name]. Patient states she will wait a while and follow instructions. States she will not submit urine because she has done several and nothing found. Says she will go to [facility] or ER if symptoms worsen.

## 2015-06-15 NOTE — Telephone Encounter (Signed)
Nurse is calling back to get more info.

## 2015-06-15 NOTE — Telephone Encounter (Signed)
Recommend she increase fluids and take probiotic. Can come to clinic and give a urine sample today if she would like.  Take norco as directed. Apply heating pad to lower back. Urgent Care or ER if anything worsens. Otherwise schedule appointment early next week for assessment.

## 2015-06-16 NOTE — Telephone Encounter (Signed)
She can have refill on her hydrocodone

## 2015-06-18 MED ORDER — HYDROCODONE-ACETAMINOPHEN 10-325 MG PO TABS: 1.0000 | ORAL_TABLET | Freq: Three times a day (TID) | ORAL | Status: DC | PRN

## 2015-06-18 NOTE — Telephone Encounter (Signed)
Printed and patient informed to pickup later to today once PCP signed and at the front.

## 2015-06-20 ENCOUNTER — Telehealth: Payer: Self-pay | Admitting: Family Medicine

## 2015-06-20 ENCOUNTER — Telehealth: Payer: Self-pay | Admitting: Cardiovascular Disease

## 2015-06-20 NOTE — Telephone Encounter (Signed)
Pt calling about ultrasounds that she had 06/15/15. She said that results were supposed to be sent here even though they were ordered by Cardiology office. I advised pt that I could ask for [facility] or Dr. Charlett Blake to f/u. I told her Cardiology was probably sent the results.  Call pt at # [phone]

## 2015-06-20 NOTE — Telephone Encounter (Signed)
I can see where the [place] was performed and resulted. Overall looks good. There is an area or questionable concern but the ordering MD/APP needs to comment on this. She needs to call the office that ordered it to discuss results if they have not contacted her yet.

## 2015-06-20 NOTE — Telephone Encounter (Signed)
I spoke with the pt and made her aware of preliminary results. This test is in Dr [employer] for review.

## 2015-06-20 NOTE — Telephone Encounter (Signed)
This message does not correlate with patient's EMR, Dr. Charlett Blake patient, order looks to be placed by Ermalinda Barrios [?]; Please Advise/SLS

## 2015-06-20 NOTE — Telephone Encounter (Signed)
New message     Calling to get renal artery ultrasound

## 2015-06-21 NOTE — Telephone Encounter (Signed)
Called the patient informed of PA instructions regarding U/S results.

## 2015-06-21 NOTE — Telephone Encounter (Signed)
Called left a detailed message to call back.

## 2015-06-22 NOTE — Telephone Encounter (Signed)
Notes Recorded by Sherren Mocha, MD on 06/22/2015 at 7:01 AM No significant stenosis in the proximal renal arteries bilaterally. May report essentially normal result to patient. thx  Pt aware of results by phone.

## 2015-06-22 NOTE — Telephone Encounter (Signed)
Left message on machine for pt to contact the office.   

## 2015-07-20 ENCOUNTER — Ambulatory Visit (INDEPENDENT_AMBULATORY_CARE_PROVIDER_SITE_OTHER): Payer: Commercial Managed Care - HMO | Admitting: Family Medicine

## 2015-07-20 ENCOUNTER — Encounter: Payer: Self-pay | Admitting: Family Medicine

## 2015-07-20 VITALS — BP 137/66 | HR 72 | Temp 98.1°F | Ht 66.0 in | Wt 165.5 lb

## 2015-07-20 DIAGNOSIS — M545 Low back pain: Secondary | ICD-10-CM

## 2015-07-20 DIAGNOSIS — M25579 Pain in unspecified ankle and joints of unspecified foot: Secondary | ICD-10-CM

## 2015-07-20 DIAGNOSIS — I10 Essential (primary) hypertension: Secondary | ICD-10-CM

## 2015-07-22 MED ORDER — CARVEDILOL 12.5 MG PO TABS: 12.5000 mg | ORAL_TABLET | Freq: Two times a day (BID) | ORAL | Status: DC

## 2015-07-22 MED ORDER — HYDROCODONE-ACETAMINOPHEN 10-325 MG PO TABS: 1.0000 | ORAL_TABLET | Freq: Three times a day (TID) | ORAL | Status: DC | PRN

## 2015-07-22 MED ORDER — ALPRAZOLAM ER 2 MG PO TB24: 2.0000 mg | ORAL_TABLET | ORAL | Status: DC

## 2015-07-22 MED ORDER — ONDANSETRON 4 MG PO TBDP: 4.0000 mg | ORAL_TABLET | Freq: Three times a day (TID) | ORAL | Status: DC | PRN

## 2015-07-22 MED ORDER — PROMETHAZINE HCL 25 MG PO TABS: ORAL_TABLET | ORAL | Status: DC

## 2015-07-22 MED ORDER — DILTIAZEM HCL ER COATED BEADS 180 MG PO CP24: 180.0000 mg | ORAL_CAPSULE | Freq: Every day | ORAL | Status: DC

## 2015-07-22 MED ORDER — CYCLOBENZAPRINE HCL 10 MG PO TABS: 10.0000 mg | ORAL_TABLET | Freq: Three times a day (TID) | ORAL | Status: DC | PRN

## 2015-07-22 MED ORDER — HYOSCYAMINE SULFATE ER 0.375 MG PO TB12: ORAL_TABLET | ORAL | Status: DC

## 2015-07-22 MED ORDER — FLUTICASONE PROPIONATE 50 MCG/ACT NA SUSP: 2.0000 | Freq: Every day | NASAL | Status: DC

## 2015-07-22 MED ORDER — RANITIDINE HCL 300 MG PO TABS: 300.0000 mg | ORAL_TABLET | Freq: Every day | ORAL | Status: DC

## 2015-07-22 MED ORDER — ALBUTEROL SULFATE HFA 108 (90 BASE) MCG/ACT IN AERS: 2.0000 | INHALATION_SPRAY | Freq: Four times a day (QID) | RESPIRATORY_TRACT | Status: DC | PRN

## 2015-07-22 NOTE — Progress Notes (Signed)
Patient had to leave without being seen, a family member went into labor. Refills approved and patient will need a follow up appt in near future.

## 2015-07-23 ENCOUNTER — Telehealth: Payer: Self-pay | Admitting: Family Medicine

## 2015-07-23 NOTE — Telephone Encounter (Signed)
-----   Message from [name], MD sent at 07/22/2015  2:47 PM EST ----- I approved a 30 day supply on some things and 90 day on others. Please arrange an appt with her in January

## 2015-07-23 NOTE — Telephone Encounter (Signed)
Patient informed to pickup hardcopy for Alprazolam and hydrocodone at the front desk. Patient will schedule appointment for January when she picks up hardcopy.

## 2015-07-31 ENCOUNTER — Ambulatory Visit: Payer: 59 | Admitting: Neurology

## 2015-08-01 ENCOUNTER — Ambulatory Visit (INDEPENDENT_AMBULATORY_CARE_PROVIDER_SITE_OTHER): Payer: Commercial Managed Care - HMO

## 2015-08-01 DIAGNOSIS — Z23 Encounter for immunization: Secondary | ICD-10-CM

## 2015-08-03 ENCOUNTER — Telehealth: Payer: Self-pay | Admitting: Cardiovascular Disease

## 2015-08-03 NOTE — Telephone Encounter (Signed)
Left message on machine for pt to contact the office.  The pt can have 12/19 slot at 10:00 if she calls back today.  If this does not work for the patient then she will need to speak with me to obtain earlier appointment.

## 2015-08-03 NOTE — Telephone Encounter (Signed)
New Message  Pt returned call. States that she has received several calls to call back to discuss receiving a sooner appt with Burt Knack. No appts available please assist

## 2015-08-14 ENCOUNTER — Telehealth: Payer: Self-pay | Admitting: Family Medicine

## 2015-08-14 NOTE — Telephone Encounter (Signed)
Patient Name: Megan [name] DOB: 04-03-1951 Initial Comment Caller states she has lower back pain, had a little blood in urine over the holidays Nurse Assessment Nurse: Ronnald Ramp, RN, Miranda Date/Time (Eastern Time): 08/14/2015 11:50:53 AM Confirm and document reason for call. If symptomatic, describe symptoms. ---Caller states she has been having pain in her lower back for 2 weeks. She had some blood in her urine 4-5 days ago, but none since then. No urinary symptoms. Has the patient traveled out of the country within the last 30 days? ---Not Applicable Does the patient have any new or worsening symptoms? ---Yes Will a triage be completed? ---Yes Related visit to physician within the last 2 weeks? ---No Does the PT have any chronic conditions? (i.e. diabetes, asthma, etc.) ---Yes List chronic conditions. ---HTN, Colon disease Is this a behavioral health or substance abuse call? ---No Guidelines Guideline Title Affirmed Question Affirmed Notes Back Pain [1] MODERATE back pain (e.g., interferes with normal activities) AND [2] present > 3 days Final Disposition User See PCP When Office is Open (within 3 days) Ronnald Ramp, RN, Miranda Comments Has not had anything for pain since last night Appt scheduled for tomorrow at 12/28 at 11am with Mackie Pai PA Referrals REFERRED TO PCP OFFICE Disagree/Comply: [facility]

## 2015-08-14 NOTE — Telephone Encounter (Signed)
Left message for pt to call back and arrange earlier appointment with Dr Burt Knack.  I have held a spot on 12/29 schedule.

## 2015-08-15 ENCOUNTER — Encounter: Payer: Self-pay | Admitting: Medical

## 2015-08-15 ENCOUNTER — Telehealth: Payer: Self-pay | Admitting: Family Medicine

## 2015-08-15 NOTE — Telephone Encounter (Signed)
Spouse called and cancel appointment for 11am charge or no charge

## 2015-08-15 NOTE — Telephone Encounter (Signed)
No charge. 

## 2015-08-15 NOTE — Progress Notes (Signed)
This encounter was created in error - please disregard.

## 2015-08-16 NOTE — Telephone Encounter (Signed)
I have made multiple attempts to arrange earlier appointment for this pt with Dr Burt Knack.  The pt has not called back. I will leave currently scheduled appointment on 10/01/15 with Dr Burt Knack.

## 2015-08-20 MED FILL — PROMETHAZINE 25 MG TABLET: 25 | 30 days supply | Qty: 30 | Fill #1

## 2015-08-21 ENCOUNTER — Other Ambulatory Visit: Payer: Self-pay | Admitting: Family Medicine

## 2015-08-21 ENCOUNTER — Encounter: Payer: Self-pay | Admitting: Family Medicine

## 2015-08-23 ENCOUNTER — Other Ambulatory Visit: Payer: Self-pay | Admitting: Family Medicine

## 2015-08-23 DIAGNOSIS — M25579 Pain in unspecified ankle and joints of unspecified foot: Secondary | ICD-10-CM

## 2015-08-23 MED ORDER — HYDROCODONE-ACETAMINOPHEN 10-325 MG PO TABS
1.0000 | ORAL_TABLET | Freq: Three times a day (TID) | ORAL | Status: DC | PRN
Start: 2015-08-23 — End: 2015-09-21

## 2015-08-23 NOTE — Telephone Encounter (Signed)
Printed and on counter for signature. Hydrocodone refill Will contact the patient to pickup hardcopy.

## 2015-08-24 ENCOUNTER — Ambulatory Visit (INDEPENDENT_AMBULATORY_CARE_PROVIDER_SITE_OTHER): Payer: Commercial Managed Care - HMO | Admitting: Physician Assistant

## 2015-08-24 ENCOUNTER — Encounter: Payer: Self-pay | Admitting: Physician Assistant

## 2015-08-24 ENCOUNTER — Ambulatory Visit (HOSPITAL_BASED_OUTPATIENT_CLINIC_OR_DEPARTMENT_OTHER)
Admission: RE | Admit: 2015-08-24 | Discharge: 2015-08-24 | Disposition: A | Discharge status: Home / Self Care | Payer: Commercial Managed Care - HMO | Source: Ambulatory Visit | Attending: Physician Assistant | Admitting: Physician Assistant

## 2015-08-24 VITALS — BP 136/70 | HR 74 | Temp 97.8°F | Ht 66.0 in | Wt 163.4 lb

## 2015-08-24 DIAGNOSIS — M545 Low back pain, unspecified: Secondary | ICD-10-CM

## 2015-08-24 DIAGNOSIS — G8929 Other chronic pain: Secondary | ICD-10-CM

## 2015-08-24 DIAGNOSIS — M47896 Other spondylosis, lumbar region: Secondary | ICD-10-CM | POA: Insufficient documentation

## 2015-08-24 DIAGNOSIS — R69 Illness, unspecified: Secondary | ICD-10-CM | POA: Diagnosis not present

## 2015-08-24 DIAGNOSIS — Z79899 Other long term (current) drug therapy: Secondary | ICD-10-CM | POA: Diagnosis not present

## 2015-08-24 DIAGNOSIS — R413 Other amnesia: Secondary | ICD-10-CM | POA: Diagnosis not present

## 2015-08-24 DIAGNOSIS — Z79891 Long term (current) use of opiate analgesic: Secondary | ICD-10-CM | POA: Diagnosis not present

## 2015-08-24 DIAGNOSIS — M47816 Spondylosis without myelopathy or radiculopathy, lumbar region: Secondary | ICD-10-CM | POA: Diagnosis not present

## 2015-08-24 LAB — VITAMIN B12

## 2015-08-24 LAB — CBC
HCT: 41.4 % (ref 36.0–46.0)
Hemoglobin: 13.8 g/dL (ref 12.0–15.0)
MCHC: 33.3 g/dL (ref 30.0–36.0)
MCV: 92.4 fl (ref 78.0–100.0)
Platelets: 323 10*3/uL (ref 150.0–400.0)
RBC: 4.48 Mil/uL (ref 3.87–5.11)
RDW: 13 % (ref 11.5–15.5)
WBC: 6.2 10*3/uL (ref 4.0–10.5)

## 2015-08-24 LAB — TSH: TSH: 1.11 u[IU]/mL (ref 0.35–4.50)

## 2015-08-24 MED ORDER — MELOXICAM 7.5 MG PO TABS: 7.5000 mg | ORAL_TABLET | Freq: Every day | ORAL | Status: DC

## 2015-08-24 MED FILL — MELOXICAM 7.5 MG TABLET: 7.5 | 30 days supply | Qty: 30 | Fill #0

## 2015-08-24 MED FILL — HYDROCODON-APAP 10-325: 10-325 | 22 days supply | Qty: 65 | Fill #0

## 2015-08-24 NOTE — Assessment & Plan Note (Signed)
Neuro exam within normal limits MMSE exam performed with score of 28/30. (Normal Category). Will check CBC, B12 and TSH today. Patient to follow-up with her Neurologist for further assessment.

## 2015-08-24 NOTE — Patient Instructions (Signed)
Please go to the lab for blood work. I will call you with your results. Go downstairs for an x-ray. I will all with these results as well.  Continue pain medication and your muscle relaxer. Add-on the Mobic once daily with food.   Please schedule a follow-up with your Neurologist for memory loss. We will alter regimen based on your lab results.  Follow-up with Dr. Charlett Blake in 1 month.

## 2015-08-24 NOTE — Progress Notes (Signed)
Patient presents to clinic today for follow-up of low back pain. Pain is described as a catching sensation over the bilateral lumbar region with radiation into legs bilaterally with weakness. Denies saddle anesthesia or change to bowel/bladder habits. Is taking Hydrocodone as directed with only mild improvement in symptoms. Cannot tolerate higher doses due to significant nausea and vomiting.  Endorses memory loss over the past 6 months. Is having a hard time recollecting certain things and remembering tasks.   Past Medical History  Diagnosis Date  . GERD (gastroesophageal reflux disease)   . Depression   . Anxiety   . Insomnia   . IBS (irritable bowel syndrome)   . Diverticulosis   . Barrett esophagus   . Hiatal hernia   . Unspecified menopausal and postmenopausal disorder   . Vitamin B12 deficiency   . Blind loop syndrome   . Hepatic cyst   . Unspecified hypothyroidism   . Diarrhea   . Family history of malignant neoplasm of gastrointestinal tract     multiple members  . Endometrial hyperplasia 02/27/2006    BENIGN ENDO BX ON 02/2007  . C. difficile diarrhea   . Cystocele, midline   . Rectocele   . Uterine prolapse without mention of vaginal wall prolapse   . Chest pain, atypical 07/14/2011  . Leaky heart valve   . Hiatal hernia   . 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 Thrush 07/21/2011  . Rectal bleeding 10/10/2011  . Sinusitis acute 02/03/2012  . Contact dermatitis 03/23/2012  . Headache(784.0) 04/15/2012  . 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 Hematuria 04/27/2012  . Allergic state 12/31/2012  . 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 Paronychia of great toe, left 06/19/2013  . Low back pain 08/15/2013  . Osteoporosis 05/2011    T score-2.5 Lt femoral neck 2012, T score -2.4 2016 FRAX 12%/2.4%  . 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 Hypertension   . Asthma   . 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 Preventative health care 09/11/2014  . Hyperlipidemia, mixed 12/17/2014  . Colon polyp   .  Headache 04/16/2015    Current Outpatient Prescriptions on File Prior to Visit  Medication Sig Dispense Refill  . albuterol (VENTOLIN HFA) 108 (90 BASE) MCG/ACT inhaler Inhale 2 puffs into the lungs every 6 (six) hours as needed. 18 each 1  . ALPRAZolam (XANAX XR) 2 MG 24 hr tablet Take 1 tablet (2 mg total) by mouth every morning. For stress 60 tablet 0  . aspirin 81 MG tablet Take 81 mg by mouth daily.    . carvedilol (COREG) 12.5 MG tablet Take 1 tablet (12.5 mg total) by mouth 2 (two) times daily. 180 tablet 0  . cyclobenzaprine (FLEXERIL) 10 MG tablet Take 1 tablet (10 mg total) by mouth 3 (three) times daily as needed for muscle spasms. 40 tablet 1  . DILT-XR 180 MG 24 hr capsule TAKE 1 CAPSULE BY MOUTH DAILY 90 capsule 2  . diltiazem (CARTIA XT) 180 MG 24 hr capsule Take 1 capsule (180 mg total) by mouth daily. 90 capsule 0  . fluticasone (FLONASE) 50 MCG/ACT nasal spray Place 2 sprays into both nostrils daily. 16 g 1  . HYDROcodone-acetaminophen (NORCO) 10-325 MG tablet Take 1 tablet by mouth every 8 (eight) hours as needed. 65 tablet 0  . hyoscyamine (LEVBID) 0.375 MG 12 hr tablet TAKE 2 TABLETS BY MOUTH TWICE DAILY AS NEEDED 120 tablet 0  . lactobacillus acidophilus (BACID) TABS tablet Take 2 tablets by mouth 3 (three) times  daily.    . Linaclotide (LINZESS) 145 MCG CAPS capsule Take 145 mcg by mouth daily.    Marland Kitchen omeprazole (PRILOSEC) 40 MG capsule Take 1 capsule by mouth 2 times daily before meals.  Take 30 min before breakfast and dinner.    . ondansetron (ZOFRAN) 4 MG tablet Take 1 tablet (4 mg total) by mouth every 8 (eight) hours as needed for nausea or vomiting. 20 tablet 1  . ondansetron (ZOFRAN-ODT) 4 MG disintegrating tablet Take 1 tablet (4 mg total) by mouth every 8 (eight) hours as needed for nausea or vomiting. 30 tablet 0  . promethazine (PHENERGAN) 25 MG tablet TAKE 1 TABLET BY MOUTH THREE TIMES DAILY AS NEEDED FOR NAUSEA 30 tablet 1  . ranitidine (ZANTAC) 300 MG tablet  Take 1 tablet (300 mg total) by mouth at bedtime. 90 tablet 0  . dicyclomine (BENTYL) 10 MG capsule Take 1 capsule (10 mg total) by mouth 3 (three) times daily before meals. (Patient not taking: Reported on 08/24/2015) 30 capsule 0  . rifaximin (XIFAXAN) 550 MG TABS tablet Take 550 mg by mouth 2 (two) times daily. Reported on 08/24/2015     No current facility-administered medications on file prior to visit.    Allergies  Allergen Reactions  . Sulfamethoxazole Shortness Of Breath    chest tightness  . Amitriptyline Anxiety and Other (See Comments)    Depression, insomnia  . Augmentin [Amoxicillin-Pot Clavulanate] Diarrhea  . Iohexol     unknown  . Iodinated Diagnostic Agents     Pt states at Dr.Grapeys office 15 years ago blacked out for 2 hours during injection for IVP. Was told never to have IV dye again.     Family History  Problem Relation Age of Onset  . Colon cancer Mother   . Diabetes Mother   . Hypertension Mother   . Colon cancer Mother   . Colon polyps Father   . Parkinson's disease Father   . Colon cancer Maternal Aunt   . Colon cancer Maternal Grandmother   . Breast cancer Maternal Grandmother 43  . Diabetes Paternal Grandmother   . Breast cancer Paternal Grandmother 44  . Colon cancer Cousin     X3  . Ovarian cancer Cousin   . Allergies Sister   . Arthritis Brother     b/l hip replace  . Alcohol abuse Daughter   . Arthritis Son   . Hypertension Son   . Allergies Son   . Osteoporosis Son   . Osteoporosis Son   . Arthritis Son     back disease, DDD  . Allergies Son   . Arthritis Son   . Irritable bowel syndrome Son     RSD  . Hypertension Paternal Grandfather   . Heart disease Paternal Grandfather   . Aneurysm Paternal Grandfather     Social History   Social History  . Marital Status: Married    Spouse Name: N/A  . Number of Children: 4  . Years of Education: N/A   Occupational History  . Retired    Social History Main Topics  . Smoking  status: Former Smoker    Quit date: 08/19/1995  . Smokeless tobacco: Never Used  . Alcohol Use: No  . Drug Use: No  . Sexual Activity: Not Currently    Birth Control/ Protection: Post-menopausal     Comment: 1st intercourse 72 yo-5 partners   Other Topics Concern  . None   Social History Narrative    Review of Systems -  See HPI.  All other ROS are negative.  BP 136/70 mmHg  Pulse 74  Temp(Src) 97.8 F (36.6 C) (Oral)  Ht '5\' 6"'$  (1.676 m)  Wt 163 lb 6.4 oz (74.118 kg)  BMI 26.39 kg/m2  SpO2 98%  Physical Exam  Constitutional: She is oriented to person, place, and time and well-developed, well-nourished, and in no distress.  HENT:  Head: Normocephalic and atraumatic.  Eyes: Conjunctivae are normal.  Cardiovascular: Normal rate, regular rhythm, normal heart sounds and intact distal pulses.   Pulmonary/Chest: Effort normal and breath sounds normal. No respiratory distress. She has no wheezes. She has no rales. She exhibits no tenderness.  Musculoskeletal:       Lumbar back: She exhibits pain and spasm.  Neurological: She is alert and oriented to person, place, and time.  Skin: Skin is warm and dry. No rash noted.  Psychiatric: Affect normal.  Vitals reviewed.   Recent Results (from the past 2160 hour(s))  Basic metabolic panel     Status: Abnormal   Collection Time: 05/28/15  9:40 AM  Result Value Ref Range   Sodium 140 135 - 145 mmol/L   Potassium 4.0 3.5 - 5.1 mmol/L   Chloride 106 101 - 111 mmol/L   CO2 25 22 - 32 mmol/L   Glucose, Bld 109 (H) 65 - 99 mg/dL   BUN 5 (L) 6 - 20 mg/dL   Creatinine, Ser 0.67 0.44 - 1.00 mg/dL   Calcium 9.4 8.9 - 10.3 mg/dL   GFR calc non Af Amer >60 >60 mL/min   GFR calc Af Amer >60 >60 mL/min    Comment: (NOTE) The eGFR has been calculated using the CKD EPI equation. This calculation has not been validated in all clinical situations. eGFR's persistently <60 mL/min signify possible Chronic Kidney Disease.    Anion gap 9 5 - 15    CBC     Status: None   Collection Time: 05/28/15  9:40 AM  Result Value Ref Range   WBC 7.9 4.0 - 10.5 K/uL   RBC 4.23 3.87 - 5.11 MIL/uL   Hemoglobin 13.1 12.0 - 15.0 g/dL   HCT 39.5 36.0 - 46.0 %   MCV 93.4 78.0 - 100.0 fL   MCH 31.0 26.0 - 34.0 pg   MCHC 33.2 30.0 - 36.0 g/dL   RDW 12.6 11.5 - 15.5 %   Platelets 283 150 - 400 K/uL  I-stat troponin, ED     Status: None   Collection Time: 05/28/15 10:07 AM  Result Value Ref Range   Troponin i, poc 0.00 0.00 - 0.08 ng/mL   Comment 3            Comment: Due to the release kinetics of cTnI, a negative result within the first hours of the onset of symptoms does not rule out myocardial infarction with certainty. If myocardial infarction is still suspected, repeat the test at appropriate intervals.   Urinalysis, Routine w reflex microscopic     Status: None   Collection Time: 05/28/15 11:13 AM  Result Value Ref Range   Color, Urine YELLOW YELLOW   APPearance CLEAR CLEAR   Specific Gravity, Urine 1.007 1.005 - 1.030   pH 7.5 5.0 - 8.0   Glucose, UA NEGATIVE NEGATIVE mg/dL   Hgb urine dipstick NEGATIVE NEGATIVE   Bilirubin Urine NEGATIVE NEGATIVE   Ketones, ur NEGATIVE NEGATIVE mg/dL   Protein, ur NEGATIVE NEGATIVE mg/dL   Urobilinogen, UA 0.2 0.0 - 1.0 mg/dL   Nitrite NEGATIVE NEGATIVE  Leukocytes, UA NEGATIVE NEGATIVE    Comment: MICROSCOPIC NOT DONE ON URINES WITH NEGATIVE PROTEIN, BLOOD, LEUKOCYTES, NITRITE, OR GLUCOSE <1000 mg/dL.  I-stat troponin, ED     Status: None   Collection Time: 05/28/15  1:18 PM  Result Value Ref Range   Troponin i, poc 0.00 0.00 - 0.08 ng/mL   Comment 3            Comment: Due to the release kinetics of cTnI, a negative result within the first hours of the onset of symptoms does not rule out myocardial infarction with certainty. If myocardial infarction is still suspected, repeat the test at appropriate intervals.   CULTURE, URINE COMPREHENSIVE     Status: None   Collection Time:  05/30/15 10:40 AM  Result Value Ref Range   Colony Count NO GROWTH    Organism ID, Bacteria NO GROWTH   Basic Metabolic Panel (BMET)     Status: None   Collection Time: 05/30/15 10:50 AM  Result Value Ref Range   Sodium 140 135 - 145 mEq/L   Potassium 3.8 3.5 - 5.1 mEq/L   Chloride 104 96 - 112 mEq/L   CO2 29 19 - 32 mEq/L   Glucose, Bld 97 70 - 99 mg/dL   BUN 7 6 - 23 mg/dL   Creatinine, Ser 0.61 0.40 - 1.20 mg/dL   Calcium 9.4 8.4 - 10.5 mg/dL   GFR 104.66 >60.00 mL/min  POCT urinalysis dipstick     Status: Abnormal   Collection Time: 05/30/15 11:45 AM  Result Value Ref Range   Color, UA gold    Clarity, UA cloudy    Glucose, UA neg    Bilirubin, UA neg    Ketones, UA neg    Spec Grav, UA 1.025    Blood, UA neg    pH, UA 6.0    Protein, UA neg    Urobilinogen, UA 0.2    Nitrite, UA neg    Leukocytes, UA small (1+) (A) Negative    Assessment/Plan: Memory loss Neuro exam within normal limits MMSE exam performed with score of 28/30. (Normal Category). Will check CBC, B12 and TSH today. Patient to follow-up with her Neurologist for further assessment.  Chronic low back pain Continue pain medication and Flexeril. Supportive measures reviewed. Mobic 7.5 mg daily added to regimen. Will obtain x-ray lumbar spine today.

## 2015-08-24 NOTE — Progress Notes (Signed)
Pre visit review using our clinic review tool, if applicable. No additional management support is needed unless otherwise documented below in the visit note. 

## 2015-08-24 NOTE — Assessment & Plan Note (Signed)
Continue pain medication and Flexeril. Supportive measures reviewed. Mobic 7.5 mg daily added to regimen. Will obtain x-ray lumbar spine today.

## 2015-09-06 ENCOUNTER — Ambulatory Visit (INDEPENDENT_AMBULATORY_CARE_PROVIDER_SITE_OTHER): Payer: Commercial Managed Care - HMO | Admitting: Family Medicine

## 2015-09-06 ENCOUNTER — Encounter: Payer: Self-pay | Admitting: Family Medicine

## 2015-09-06 VITALS — BP 122/78 | HR 81 | Temp 98.4°F | Ht 66.0 in | Wt 164.1 lb

## 2015-09-06 DIAGNOSIS — I10 Essential (primary) hypertension: Secondary | ICD-10-CM | POA: Diagnosis not present

## 2015-09-06 DIAGNOSIS — K219 Gastro-esophageal reflux disease without esophagitis: Secondary | ICD-10-CM

## 2015-09-06 DIAGNOSIS — R109 Unspecified abdominal pain: Secondary | ICD-10-CM

## 2015-09-06 DIAGNOSIS — R059 Cough, unspecified: Secondary | ICD-10-CM

## 2015-09-06 DIAGNOSIS — R51 Headache: Secondary | ICD-10-CM

## 2015-09-06 DIAGNOSIS — N200 Calculus of kidney: Secondary | ICD-10-CM | POA: Diagnosis not present

## 2015-09-06 DIAGNOSIS — R1013 Epigastric pain: Secondary | ICD-10-CM | POA: Diagnosis not present

## 2015-09-06 DIAGNOSIS — R519 Headache, unspecified: Secondary | ICD-10-CM

## 2015-09-06 DIAGNOSIS — R739 Hyperglycemia, unspecified: Secondary | ICD-10-CM

## 2015-09-06 DIAGNOSIS — R05 Cough: Secondary | ICD-10-CM | POA: Diagnosis not present

## 2015-09-06 DIAGNOSIS — K449 Diaphragmatic hernia without obstruction or gangrene: Secondary | ICD-10-CM

## 2015-09-06 DIAGNOSIS — J209 Acute bronchitis, unspecified: Secondary | ICD-10-CM

## 2015-09-06 LAB — POCT RAPID STREP A (OFFICE): Rapid Strep A Screen: NEGATIVE

## 2015-09-06 MED ORDER — ALBUTEROL SULFATE HFA 108 (90 BASE) MCG/ACT IN AERS: 2.0000 | INHALATION_SPRAY | Freq: Four times a day (QID) | RESPIRATORY_TRACT | Status: DC | PRN

## 2015-09-06 MED ORDER — CLARITHROMYCIN 500 MG PO TABS: 500.0000 mg | ORAL_TABLET | Freq: Two times a day (BID) | ORAL | Status: DC

## 2015-09-06 MED ORDER — OMEPRAZOLE 40 MG PO CPDR: DELAYED_RELEASE_CAPSULE | ORAL | Status: DC

## 2015-09-06 MED ORDER — HYDROCODONE-HOMATROPINE 5-1.5 MG/5ML PO SYRP: 5.0000 mL | ORAL_SOLUTION | Freq: Three times a day (TID) | ORAL | Status: DC | PRN

## 2015-09-06 MED FILL — HYDROCODONE-HOMATROPINE SYR: 5-1.5 | 8 days supply | Qty: 120 | Fill #0

## 2015-09-06 MED FILL — VENTOLIN HFA 90 MCG INHALER: 108 (90 BAS | 25 days supply | Qty: 18 | Fill #0

## 2015-09-06 MED FILL — CLARITHROMYCIN 500 MG TAB: 500 | 12 days supply | Qty: 24 | Fill #0

## 2015-09-06 MED FILL — OMEPRAZOLE DR 40 MG CAPSULE: 40 | 30 days supply | Qty: 60 | Fill #0

## 2015-09-06 NOTE — Assessment & Plan Note (Signed)
Well controlled, no changes to meds. Encouraged heart healthy diet such as the DASH diet and exercise as tolerated.  °

## 2015-09-06 NOTE — Patient Instructions (Signed)
Food Choices for Gastroesophageal Reflux Disease, Adult When you have gastroesophageal reflux disease (GERD), the foods you eat and your eating habits are very important. Choosing the right foods can help ease the discomfort of GERD. WHAT GENERAL GUIDELINES DO I NEED TO FOLLOW?  Choose fruits, vegetables, whole grains, low-fat dairy products, and low-fat meat, fish, and poultry.  Limit fats such as oils, salad dressings, butter, nuts, and avocado.  Keep a food diary to identify foods that cause symptoms.  Avoid foods that cause reflux. These may be different for different people.  Eat frequent small meals instead of three large meals each day.  Eat your meals slowly, in a relaxed setting.  Limit fried foods.  Cook foods using methods other than frying.  Avoid drinking alcohol.  Avoid drinking large amounts of liquids with your meals.  Avoid bending over or lying down until 2-3 hours after eating. WHAT FOODS ARE NOT RECOMMENDED? The following are some foods and drinks that may worsen your symptoms: Vegetables Tomatoes. Tomato juice. Tomato and spaghetti sauce. Chili peppers. Onion and garlic. Horseradish. Fruits Oranges, grapefruit, and lemon (fruit and juice). Meats High-fat meats, fish, and poultry. This includes hot dogs, ribs, ham, sausage, salami, and bacon. Dairy Whole milk and chocolate milk. Sour cream. Cream. Butter. Ice cream. Cream cheese.  Beverages Coffee and tea, with or without caffeine. Carbonated beverages or energy drinks. Condiments Hot sauce. Barbecue sauce.  Sweets/Desserts Chocolate and cocoa. Donuts. Peppermint and spearmint. Fats and Oils High-fat foods, including French fries and potato chips. Other Vinegar. Strong spices, such as black pepper, white pepper, red pepper, cayenne, curry powder, cloves, ginger, and chili powder. The items listed above may not be a complete list of foods and beverages to avoid. Contact your dietitian for more  information.   This information is not intended to replace advice given to you by your health care provider. Make sure you discuss any questions you have with your health care provider.   Document Released: 08/04/2005 Document Revised: 08/25/2014 Document Reviewed: 06/08/2013 Elsevier Interactive Patient Education 2016 [employer]  

## 2015-09-07 ENCOUNTER — Telehealth: Payer: Self-pay | Admitting: Family Medicine

## 2015-09-07 ENCOUNTER — Other Ambulatory Visit: Payer: Commercial Managed Care - HMO

## 2015-09-07 DIAGNOSIS — R12 Heartburn: Secondary | ICD-10-CM | POA: Diagnosis not present

## 2015-09-07 DIAGNOSIS — R6881 Early satiety: Secondary | ICD-10-CM | POA: Diagnosis not present

## 2015-09-07 DIAGNOSIS — R140 Abdominal distension (gaseous): Secondary | ICD-10-CM | POA: Diagnosis not present

## 2015-09-07 DIAGNOSIS — Z800 Family history of malignant neoplasm of digestive organs: Secondary | ICD-10-CM | POA: Diagnosis not present

## 2015-09-07 DIAGNOSIS — R194 Change in bowel habit: Secondary | ICD-10-CM

## 2015-09-07 DIAGNOSIS — K5900 Constipation, unspecified: Secondary | ICD-10-CM | POA: Diagnosis not present

## 2015-09-07 DIAGNOSIS — R197 Diarrhea, unspecified: Secondary | ICD-10-CM | POA: Diagnosis not present

## 2015-09-07 DIAGNOSIS — K219 Gastro-esophageal reflux disease without esophagitis: Secondary | ICD-10-CM | POA: Diagnosis not present

## 2015-09-07 DIAGNOSIS — R109 Unspecified abdominal pain: Secondary | ICD-10-CM | POA: Diagnosis not present

## 2015-09-07 DIAGNOSIS — R198 Other specified symptoms and signs involving the digestive system and abdomen: Secondary | ICD-10-CM | POA: Diagnosis not present

## 2015-09-07 DIAGNOSIS — N200 Calculus of kidney: Secondary | ICD-10-CM | POA: Diagnosis not present

## 2015-09-07 DIAGNOSIS — R1013 Epigastric pain: Secondary | ICD-10-CM | POA: Diagnosis not present

## 2015-09-07 LAB — CBC WITH DIFFERENTIAL/PLATELET
BASOS ABS: 0.1 10*3/uL (ref 0.0–0.1)
BASOS PCT: 0.6 % (ref 0.0–3.0)
EOS ABS: 0.3 10*3/uL (ref 0.0–0.7)
Eosinophils Relative: 3.4 % (ref 0.0–5.0)
HEMATOCRIT: 41.4 % (ref 36.0–46.0)
HEMOGLOBIN: 13.6 g/dL (ref 12.0–15.0)
LYMPHS PCT: 34.1 % (ref 12.0–46.0)
Lymphs Abs: 2.9 10*3/uL (ref 0.7–4.0)
MCHC: 32.9 g/dL (ref 30.0–36.0)
MCV: 93.8 fl (ref 78.0–100.0)
Monocytes Absolute: 0.7 10*3/uL (ref 0.1–1.0)
Monocytes Relative: 8.5 % (ref 3.0–12.0)
Neutro Abs: 4.5 10*3/uL (ref 1.4–7.7)
Neutrophils Relative %: 53.4 % (ref 43.0–77.0)
Platelets: 346 10*3/uL (ref 150.0–400.0)
RBC: 4.41 Mil/uL (ref 3.87–5.11)
RDW: 12.9 % (ref 11.5–15.5)
WBC: 8.4 10*3/uL (ref 4.0–10.5)

## 2015-09-07 LAB — COMPREHENSIVE METABOLIC PANEL
ALBUMIN: 4.3 g/dL (ref 3.5–5.2)
ALT: 12 U/L (ref 0–35)
AST: 14 U/L (ref 0–37)
Alkaline Phosphatase: 100 U/L (ref 39–117)
BUN: 6 mg/dL (ref 6–23)
CALCIUM: 9.2 mg/dL (ref 8.4–10.5)
CHLORIDE: 102 meq/L (ref 96–112)
CO2: 28 mEq/L (ref 19–32)
CREATININE: 0.67 mg/dL (ref 0.40–1.20)
GFR: 93.85 mL/min (ref >60.00)
Glucose, Bld: 83 mg/dL (ref 70–99)
Potassium: 4.1 mEq/L (ref 3.5–5.1)
Sodium: 139 mEq/L (ref 135–145)
Total Bilirubin: 0.4 mg/dL (ref 0.2–1.2)
Total Protein: 7.6 g/dL (ref 6.0–8.3)

## 2015-09-07 LAB — URINALYSIS, ROUTINE W REFLEX MICROSCOPIC
Bilirubin Urine: NEGATIVE
Hgb urine dipstick: NEGATIVE
Ketones, ur: NEGATIVE
NITRITE: NEGATIVE
Total Protein, Urine: NEGATIVE
UROBILINOGEN UA: 0.2 (ref 0.0–1.0)
Urine Glucose: NEGATIVE
pH: 7 (ref 5.0–8.0)

## 2015-09-07 LAB — H. PYLORI ANTIBODY, IGG: H Pylori IgG: NEGATIVE

## 2015-09-07 MED FILL — metroNIDAZOLE 500 MG TABS: 500 | 10 days supply | Qty: 20 | Fill #0

## 2015-09-07 MED FILL — SUCRALFATE 1 GM TABLET: 1 | 30 days supply | Qty: 120 | Fill #0 | Status: TO

## 2015-09-07 NOTE — Telephone Encounter (Signed)
Pt says that she had labs. She is requesting her lab results.    CB: [id number]

## 2015-09-07 NOTE — Telephone Encounter (Signed)
Called left message to call back 

## 2015-09-09 LAB — URINE CULTURE: Colony Count: 35000

## 2015-09-10 ENCOUNTER — Other Ambulatory Visit: Payer: Self-pay | Admitting: Family Medicine

## 2015-09-10 DIAGNOSIS — R300 Dysuria: Secondary | ICD-10-CM

## 2015-09-10 NOTE — Telephone Encounter (Signed)
Patient has been informed.

## 2015-09-11 ENCOUNTER — Other Ambulatory Visit (INDEPENDENT_AMBULATORY_CARE_PROVIDER_SITE_OTHER): Payer: Commercial Managed Care - HMO

## 2015-09-11 DIAGNOSIS — R300 Dysuria: Secondary | ICD-10-CM

## 2015-09-11 LAB — URINALYSIS
BILIRUBIN URINE: NEGATIVE
Hgb urine dipstick: NEGATIVE
KETONES UR: NEGATIVE
LEUKOCYTES UA: NEGATIVE
Nitrite: NEGATIVE
PH: 6 (ref 5.0–8.0)
Specific Gravity, Urine: <=1.005 — AB (ref 1.000–1.030)
TOTAL PROTEIN, URINE-UPE24: NEGATIVE
Urine Glucose: NEGATIVE
Urobilinogen, UA: 0.2 (ref 0.0–1.0)

## 2015-09-13 LAB — CULTURE, URINE COMPREHENSIVE
COLONY COUNT: NO GROWTH
Organism ID, Bacteria: NO GROWTH

## 2015-09-16 ENCOUNTER — Encounter: Payer: Self-pay | Admitting: Family Medicine

## 2015-09-16 DIAGNOSIS — J209 Acute bronchitis, unspecified: Secondary | ICD-10-CM | POA: Insufficient documentation

## 2015-09-16 DIAGNOSIS — J40 Bronchitis, not specified as acute or chronic: Secondary | ICD-10-CM | POA: Insufficient documentation

## 2015-09-16 NOTE — Assessment & Plan Note (Signed)
Avoid offending foods, start probiotics. Do not eat large meals in late evening and consider raising head of bed. Omeprazole prn 

## 2015-09-16 NOTE — Assessment & Plan Note (Signed)
Encouraged increased hydration, 64 ounces of clear fluids daily. Minimize alcohol and caffeine. Eat small frequent meals with lean proteins and complex carbs. Avoid high and low blood sugars. Get adequate sleep, 7-8 hours a night. Needs exercise daily preferably in the morning.  

## 2015-09-16 NOTE — Assessment & Plan Note (Signed)
minimize simple carbs. Increase exercise as tolerated.  

## 2015-09-16 NOTE — Assessment & Plan Note (Signed)
Encouraged increased rest and hydration, add probiotics, zinc such as Coldeze or Xicam. Treat fevers as needed, antibiotic

## 2015-09-16 NOTE — Progress Notes (Signed)
Patient ID: Megan [name], female   DOB: Sep 01, 1950, 65 y.o.   MRN: [id number]   Subjective:    Patient ID: Megan [name], female    DOB: Nov 05, 1950, 65 y.o.   MRN: [id number]  Chief Complaint  Patient presents with  . Cough    congestion    HPI Patient is in today for evaluation of numerous complaints. She has worsening head congestion as well as chest congestion. His cough is generally dry but occasionally productive. It is worse at night when she lies down. She notes a worsening sore throat for several days now. No fevers or chills. Has had some intermittent dyspepsia. No bloody or tarry stool. Denies CP/palp/SOB/HA/fevers/GI or GU c/o. Taking meds as prescribed  Past Medical History  Diagnosis Date  . GERD (gastroesophageal reflux disease)   . Depression   . Anxiety   . Insomnia   . IBS (irritable bowel syndrome)   . Diverticulosis   . Barrett esophagus   . Hiatal hernia   . Unspecified menopausal and postmenopausal disorder   . Vitamin B12 deficiency   . Blind loop syndrome   . Hepatic cyst   . Unspecified hypothyroidism   . Diarrhea   . Family history of malignant neoplasm of gastrointestinal tract     multiple members  . Endometrial hyperplasia 02/27/2006    BENIGN ENDO BX ON 02/2007  . C. difficile diarrhea   . Cystocele, midline   . Rectocele   . Uterine prolapse without mention of vaginal wall prolapse   . Chest pain, atypical 07/14/2011  . Leaky heart valve   . Hiatal hernia   . 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 Thrush 07/21/2011  . Rectal bleeding 10/10/2011  . Sinusitis acute 02/03/2012  . Contact dermatitis 03/23/2012  . Headache(784.0) 04/15/2012  . 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 Hematuria 04/27/2012  . Allergic state 12/31/2012  . 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 Paronychia of great toe, left 06/19/2013  . Low back pain 08/15/2013  . Osteoporosis 05/2011    T score-2.5 Lt femoral neck 2012, T score -2.4 2016 FRAX 12%/2.4%  . Adrenal gland  cyst ([place]) 05/22/2014  . Hypertension   . Asthma   . 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 Preventative health care 09/11/2014  . Hyperlipidemia, mixed 12/17/2014  . Colon polyp   . Headache 04/16/2015    Past Surgical History  Procedure Laterality Date  . Appendectomy    . Uterine fibroid surgery    . Hysteroscopy      POLYP    Family History  Problem Relation Age of Onset  . Colon cancer Mother   . Diabetes Mother   . Hypertension Mother   . Colon cancer Mother   . Colon polyps Father   . Parkinson's disease Father   . Colon cancer Maternal Aunt   . Colon cancer Maternal Grandmother   . Breast cancer Maternal Grandmother 51  . Diabetes Paternal Grandmother   . Breast cancer Paternal Grandmother 60  . Colon cancer Cousin     X3  . Ovarian cancer Cousin   . Allergies Sister   . Arthritis Brother     b/l hip replace  . Alcohol abuse Daughter   . Arthritis Son   . Hypertension Son   . Allergies Son   . Osteoporosis Son   . Osteoporosis Son   . Arthritis Son     back disease, DDD  . Allergies Son   . Arthritis Son   . Irritable bowel syndrome  Son     RSD  . Hypertension Paternal Grandfather   . Heart disease Paternal Grandfather   . Aneurysm Paternal Grandfather     Social History   Social History  . Marital Status: Married    Spouse Name: N/A  . Number of Children: 4  . Years of Education: N/A   Occupational History  . Retired    Social History Main Topics  . Smoking status: Former Smoker    Quit date: 08/19/1995  . Smokeless tobacco: Never Used  . Alcohol Use: No  . Drug Use: No  . Sexual Activity: Not Currently    Birth Control/ Protection: Post-menopausal     Comment: 1st intercourse 16 yo-5 partners   Other Topics Concern  . Not on file   Social History Narrative    Outpatient Prescriptions Prior to Visit  Medication Sig Dispense Refill  . ALPRAZolam (XANAX XR) 2 MG 24 hr tablet Take 1 tablet (2 mg total) by mouth every morning. For stress 60  tablet 0  . aspirin 81 MG tablet Take 81 mg by mouth daily.    . carvedilol (COREG) 12.5 MG tablet Take 1 tablet (12.5 mg total) by mouth 2 (two) times daily. 180 tablet 0  . cyclobenzaprine (FLEXERIL) 10 MG tablet Take 1 tablet (10 mg total) by mouth 3 (three) times daily as needed for muscle spasms. 40 tablet 1  . dicyclomine (BENTYL) 10 MG capsule Take 1 capsule (10 mg total) by mouth 3 (three) times daily before meals. 30 capsule 0  . diltiazem (CARTIA XT) 180 MG 24 hr capsule Take 1 capsule (180 mg total) by mouth daily. 90 capsule 0  . fluticasone (FLONASE) 50 MCG/ACT nasal spray Place 2 sprays into both nostrils daily. 16 g 1  . HYDROcodone-acetaminophen (NORCO) 10-325 MG tablet Take 1 tablet by mouth every 8 (eight) hours as needed. 65 tablet 0  . hyoscyamine (LEVBID) 0.375 MG 12 hr tablet TAKE 2 TABLETS BY MOUTH TWICE DAILY AS NEEDED 120 tablet 0  . lactobacillus acidophilus (BACID) TABS tablet Take 2 tablets by mouth 3 (three) times daily.    . Linaclotide (LINZESS) 145 MCG CAPS capsule Take 145 mcg by mouth daily.    . meloxicam (MOBIC) 7.5 MG tablet Take 1 tablet (7.5 mg total) by mouth daily. 30 tablet 0  . ondansetron (ZOFRAN-ODT) 4 MG disintegrating tablet Take 1 tablet (4 mg total) by mouth every 8 (eight) hours as needed for nausea or vomiting. 30 tablet 0  . promethazine (PHENERGAN) 25 MG tablet TAKE 1 TABLET BY MOUTH THREE TIMES DAILY AS NEEDED FOR NAUSEA 30 tablet 1  . ranitidine (ZANTAC) 300 MG tablet Take 1 tablet (300 mg total) by mouth at bedtime. 90 tablet 0  . rifaximin (XIFAXAN) 550 MG TABS tablet Take 550 mg by mouth 2 (two) times daily. Reported on 08/24/2015    . albuterol (VENTOLIN HFA) 108 (90 BASE) MCG/ACT inhaler Inhale 2 puffs into the lungs every 6 (six) hours as needed. 18 each 1  . omeprazole (PRILOSEC) 40 MG capsule Take 1 capsule by mouth 2 times daily before meals.  Take 30 min before breakfast and dinner.    Marland Kitchen DILT-XR 180 MG 24 hr capsule TAKE 1 CAPSULE BY  MOUTH DAILY 90 capsule 2  . ondansetron (ZOFRAN) 4 MG tablet Take 1 tablet (4 mg total) by mouth every 8 (eight) hours as needed for nausea or vomiting. 20 tablet 1   No facility-administered medications prior to visit.  Allergies  Allergen Reactions  . Sulfamethoxazole Shortness Of Breath    chest tightness  . Amitriptyline Anxiety and Other (See Comments)    Depression, insomnia  . Augmentin [Amoxicillin-Pot Clavulanate] Diarrhea  . Iohexol     unknown  . Iodinated Diagnostic Agents     Pt states at Dr.Grapeys office 15 years ago blacked out for 2 hours during injection for IVP. Was told never to have IV dye again.     Review of Systems  Constitutional: Negative for fever and malaise/fatigue.  HENT: Positive for congestion.   Eyes: Negative for discharge.  Respiratory: Positive for cough. Negative for shortness of breath.   Cardiovascular: Negative for chest pain, palpitations and leg swelling.  Gastrointestinal: Negative for nausea and abdominal pain.  Genitourinary: Negative for dysuria.  Musculoskeletal: Negative for falls.  Skin: Negative for rash.  Neurological: Negative for loss of consciousness and headaches.  Endo/Heme/Allergies: Negative for environmental allergies.  Psychiatric/Behavioral: Negative for depression. The patient is nervous/anxious.        Objective:    Physical Exam  Constitutional: She is oriented to person, place, and time. She appears well-developed and well-nourished. No distress.  HENT:  Head: Normocephalic and atraumatic.  Nose: Nose normal.  Eyes: Right eye exhibits no discharge. Left eye exhibits no discharge.  Neck: Normal range of motion. Neck supple.  Cardiovascular: Normal rate and regular rhythm.   No murmur heard. Pulmonary/Chest: Effort normal and breath sounds normal.  Abdominal: Soft. Bowel sounds are normal. There is no tenderness.  Musculoskeletal: She exhibits no edema.  Neurological: She is alert and oriented to person,  place, and time.  Skin: Skin is warm and dry.  Psychiatric: She has a normal mood and affect.  Nursing note and vitals reviewed.   BP 122/78 mmHg  Pulse 81  Temp(Src) 98.4 F (36.9 C) (Oral)  Ht 5\' 6"  (1.676 m)  Wt 164 lb 2 oz (74.447 kg)  BMI 26.50 kg/m2  SpO2 95% Wt Readings from Last 3 Encounters:  09/06/15 164 lb 2 oz (74.447 kg)  08/24/15 163 lb 6.4 oz (74.118 kg)  07/20/15 165 lb 8 oz (75.07 kg)     Lab Results  Component Value Date   WBC 8.4 09/06/2015   HGB 13.6 09/06/2015   HCT 41.4 09/06/2015   PLT 346.0 09/06/2015   GLUCOSE 83 09/06/2015   CHOL 215* 04/16/2015   TRIG 134.0 04/16/2015   HDL 45.70 04/16/2015   LDLDIRECT 165.0 06/30/2011   LDLCALC 142* 04/16/2015   ALT 12 09/06/2015   AST 14 09/06/2015   NA 139 09/06/2015   K 4.1 09/06/2015   CL 102 09/06/2015   CREATININE 0.67 09/06/2015   BUN 6 09/06/2015   CO2 28 09/06/2015   TSH 1.11 08/24/2015   HGBA1C 5.7 04/16/2015    Lab Results  Component Value Date   TSH 1.11 08/24/2015   Lab Results  Component Value Date   WBC 8.4 09/06/2015   HGB 13.6 09/06/2015   HCT 41.4 09/06/2015   MCV 93.8 09/06/2015   PLT 346.0 09/06/2015   Lab Results  Component Value Date   NA 139 09/06/2015   K 4.1 09/06/2015   CO2 28 09/06/2015   GLUCOSE 83 09/06/2015   BUN 6 09/06/2015   CREATININE 0.67 09/06/2015   BILITOT 0.4 09/06/2015   ALKPHOS 100 09/06/2015   AST 14 09/06/2015   ALT 12 09/06/2015   PROT 7.6 09/06/2015   ALBUMIN 4.3 09/06/2015   CALCIUM 9.2 09/06/2015   ANIONGAP  9 05/28/2015   GFR 93.85 09/06/2015   Lab Results  Component Value Date   CHOL 215* 04/16/2015   Lab Results  Component Value Date   HDL 45.70 04/16/2015   Lab Results  Component Value Date   LDLCALC 142* 04/16/2015   Lab Results  Component Value Date   TRIG 134.0 04/16/2015   Lab Results  Component Value Date   CHOLHDL 5 04/16/2015   Lab Results  Component Value Date   HGBA1C 5.7 04/16/2015         Assessment & Plan:   Problem List Items Addressed This Visit    Abdominal pain   Relevant Orders   Comprehensive metabolic panel (Completed)   Acute bronchitis    Encouraged increased rest and hydration, add probiotics, zinc such as Coldeze or Xicam. Treat fevers as needed, antibiotic      Relevant Medications   HYDROcodone-homatropine (HYCODAN) 5-1.5 MG/5ML syrup   clarithromycin (BIAXIN) 500 MG tablet   Essential hypertension    Well controlled, no changes to meds. Encouraged heart healthy diet such as the DASH diet and exercise as tolerated.       Gastroesophageal reflux disease with hiatal hernia    Avoid offending foods, start probiotics. Do not eat large meals in late evening and consider raising head of bed. Omeprazole prn      Relevant Medications   omeprazole (PRILOSEC) 40 MG capsule   Headache    Encouraged increased hydration, 64 ounces of clear fluids daily. Minimize alcohol and caffeine. Eat small frequent meals with lean proteins and complex carbs. Avoid high and low blood sugars. Get adequate sleep, 7-8 hours a night. Needs exercise daily preferably in the morning.      Hyperglycemia    minimize simple carbs. Increase exercise as tolerated.        Other Visit Diagnoses    Kidney stone    -  Primary    Relevant Orders    CBC with Differential (Completed)    Urinalysis    Dyspepsia        Relevant Orders    CBC with Differential (Completed)    Ambulatory referral to Gastroenterology    H. pylori antibody, IgG (Completed)    Cough        Relevant Orders    POCT rapid strep A (Completed)       I have discontinued Ms. Duhamel's DILT-XR. I am also having her start on HYDROcodone-homatropine and clarithromycin. Additionally, I am having her maintain her lactobacillus acidophilus, dicyclomine, Linaclotide, aspirin, rifaximin, ranitidine, promethazine, ondansetron, hyoscyamine, fluticasone, diltiazem, cyclobenzaprine, carvedilol, ALPRAZolam,  HYDROcodone-acetaminophen, meloxicam, omeprazole, and albuterol.  Meds ordered this encounter  Medications  . omeprazole (PRILOSEC) 40 MG capsule    Sig: Take 1 capsule by mouth 2 times daily before meals.  Take 30 min before breakfast and dinner.    Dispense:  60 capsule    Refill:  6  . albuterol (VENTOLIN HFA) 108 (90 Base) MCG/ACT inhaler    Sig: Inhale 2 puffs into the lungs every 6 (six) hours as needed.    Dispense:  18 g    Refill:  6  . HYDROcodone-homatropine (HYCODAN) 5-1.5 MG/5ML syrup    Sig: Take 5 mLs by mouth every 8 (eight) hours as needed for cough.    Dispense:  120 mL    Refill:  0  . clarithromycin (BIAXIN) 500 MG tablet    Sig: Take 1 tablet (500 mg total) by mouth 2 (two) times daily.  Dispense:  24 tablet    Refill:  0     Penni Homans, MD

## 2015-09-20 ENCOUNTER — Other Ambulatory Visit: Payer: Self-pay | Admitting: Family Medicine

## 2015-09-20 DIAGNOSIS — M25579 Pain in unspecified ankle and joints of unspecified foot: Secondary | ICD-10-CM

## 2015-09-20 NOTE — Telephone Encounter (Signed)
Decline refills and explain the need for using just one pharmacy and we can then decide what else to prescribe

## 2015-09-20 NOTE — Telephone Encounter (Signed)
Caller name: Self  Can be reached: [phone]  Pharmacy:  [place], [place] - [place] [phone] (Phone) [phone] (Fax)         Reason for call: Patient request refill on Xanax but she does not want the Extended Release. Her insurance will not cover this one and she can not tolerate them. She needs the Xanax 2mg .

## 2015-09-20 NOTE — Telephone Encounter (Signed)
Advise on this refill since patient had also received it from [facility].

## 2015-09-21 MED ORDER — HYDROCODONE-ACETAMINOPHEN 10-325 MG PO TABS: 1.0000 | ORAL_TABLET | Freq: Three times a day (TID) | ORAL | Status: DC | PRN

## 2015-09-21 MED ORDER — ALPRAZOLAM 2 MG PO TABS: 2.0000 mg | ORAL_TABLET | Freq: Two times a day (BID) | ORAL | Status: DC | PRN

## 2015-09-21 MED FILL — HYDROCODON-APAP 10-325: 10-325 | 22 days supply | Qty: 65 | Fill #0

## 2015-09-21 MED FILL — ALPRAZolam 2 MG TABS: 2 | 30 days supply | Qty: 60 | Fill #0

## 2015-09-21 NOTE — Telephone Encounter (Signed)
Faxed to [facility].  Called the patient informed of ALL PCP instructions on medication refills.

## 2015-09-21 NOTE — Telephone Encounter (Signed)
Last refill:08/23/2015

## 2015-09-21 NOTE — Telephone Encounter (Signed)
We received request for this refill (hardcopy refills) from both [facility] and [facility].  Declined refill on both per PCP instructions due to office policy to fill controlls at one pharmacy only. PCP did however agree to change her to the alprazolam 2 mg (not extended release) and to refill but to inform patient to get this refill in the future from one pharmacy only as she did sign contract which stated that she would agree to do so.  Called the patient to inform, left msg. To call back.

## 2015-09-21 NOTE — Addendum Note (Signed)
Addended by: Sharon Seller B on: 09/21/2015 07:56 AM   Modules accepted: Orders

## 2015-09-21 NOTE — Telephone Encounter (Signed)
Called the patient informed ok to pickup hardcopy at the front desk.

## 2015-09-21 NOTE — Addendum Note (Signed)
Addended by: Sharon Seller B on: 09/21/2015 01:39 PM   Modules accepted: Orders

## 2015-09-21 NOTE — Addendum Note (Signed)
Addended by: Sharon Seller B on: 09/21/2015 07:38 AM   Modules accepted: Orders, Medications

## 2015-09-21 NOTE — Telephone Encounter (Signed)
Printed hardcopy and on counter for signature 

## 2015-09-27 ENCOUNTER — Encounter: Payer: Self-pay | Admitting: Family Medicine

## 2015-10-01 ENCOUNTER — Encounter: Payer: Self-pay | Admitting: Cardiovascular Disease

## 2015-10-01 ENCOUNTER — Ambulatory Visit (INDEPENDENT_AMBULATORY_CARE_PROVIDER_SITE_OTHER): Payer: Commercial Managed Care - HMO | Admitting: Cardiovascular Disease

## 2015-10-01 VITALS — BP 134/76 | HR 70 | Ht 66.0 in | Wt 163.4 lb

## 2015-10-01 DIAGNOSIS — I10 Essential (primary) hypertension: Secondary | ICD-10-CM | POA: Diagnosis not present

## 2015-10-01 DIAGNOSIS — R002 Palpitations: Secondary | ICD-10-CM

## 2015-10-01 DIAGNOSIS — I071 Rheumatic tricuspid insufficiency: Secondary | ICD-10-CM

## 2015-10-01 MED ORDER — CARVEDILOL 6.25 MG PO TABS: 6.2500 mg | ORAL_TABLET | Freq: Two times a day (BID) | ORAL | Status: DC

## 2015-10-01 MED FILL — CARVEDILOL 6.25 MG TABLET: 6.25 | 90 days supply | Qty: 180 | Fill #0

## 2015-10-01 NOTE — Progress Notes (Signed)
Cardiology Office Note Date:  10/01/2015   ID:  Megan [name], DOB 20-Apr-1951, MRN [id number]  PCP:  Penni Homans, MD  Cardiologist:  Sherren Mocha, MD    Chief Complaint  Patient presents with  . Follow-up    Tricuspid regurgitation     History of Present Illness: Megan [name] is a 65 y.o. female who presents for follow-up evaluation. She's been followed for chest pain, palpitations, moderate tricuspid regurgitation.  She has had a stress echocardiogram that was normal.  Last echocardiogram in 2016 showed normal LV size and function, moderate and stable tricuspid regurgitation, and an estimated RV systolic pressure of 41 mmHg.  States BP has been erratic. She attributes this to stress. She worries a lot about her children. She has been having night sweats and problems with heart palpitations sometimes waking her up from sleep in the mornings. She denies dizziness, lightheadedness, or syncope. She has weaned herself off of carvedilol because she was concerned this was contributing to her nocturnal symptoms. She denies substernal chest pain or pressure. She denies dyspnea with exertion. She denies orthopnea or PND. No leg swelling.  Past Medical History  Diagnosis Date  . GERD (gastroesophageal reflux disease)   . Depression   . Anxiety   . Insomnia   . IBS (irritable bowel syndrome)   . Diverticulosis   . Barrett esophagus   . Hiatal hernia   . Unspecified menopausal and postmenopausal disorder   . Vitamin B12 deficiency   . Blind loop syndrome   . Hepatic cyst   . Unspecified hypothyroidism   . Diarrhea   . Family history of malignant neoplasm of gastrointestinal tract     multiple members  . Endometrial hyperplasia 02/27/2006    BENIGN ENDO BX ON 02/2007  . C. difficile diarrhea   . Cystocele, midline   . Rectocele   . Uterine prolapse without mention of vaginal wall prolapse   . Chest pain, atypical 07/14/2011  . Leaky heart valve   . Hiatal hernia     . 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 Thrush 07/21/2011  . Rectal bleeding 10/10/2011  . Sinusitis acute 02/03/2012  . Contact dermatitis 03/23/2012  . Headache(784.0) 04/15/2012  . 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 Hematuria 04/27/2012  . Allergic state 12/31/2012  . 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 Paronychia of great toe, left 06/19/2013  . Low back pain 08/15/2013  . Osteoporosis 05/2011    T score-2.5 Lt femoral neck 2012, T score -2.4 2016 FRAX 12%/2.4%  . Adrenal gland cyst ([place]) 05/22/2014  . Hypertension   . Asthma   . 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 Preventative health care 09/11/2014  . Hyperlipidemia, mixed 12/17/2014  . Colon polyp   . Headache 04/16/2015    Past Surgical History  Procedure Laterality Date  . Appendectomy    . Uterine fibroid surgery    . Hysteroscopy      POLYP    Current Outpatient Prescriptions  Medication Sig Dispense Refill  . albuterol (PROVENTIL HFA;VENTOLIN HFA) 108 (90 Base) MCG/ACT inhaler Inhale 2 puffs into the lungs every 6 (six) hours as needed for wheezing or shortness of breath.    . alprazolam (XANAX) 2 MG tablet Take 1 tablet (2 mg total) by mouth 2 (two) times daily as needed for sleep. 60 tablet 0  . aspirin 81 MG tablet Take 81 mg by mouth daily.    . carvedilol (COREG) 6.25 MG tablet Take 1 tablet (6.25 mg total) by mouth 2 (  two) times daily. 180 tablet 3  . cyclobenzaprine (FLEXERIL) 10 MG tablet Take 1 tablet (10 mg total) by mouth 3 (three) times daily as needed for muscle spasms. 40 tablet 1  . dicyclomine (BENTYL) 10 MG capsule Take 1 capsule (10 mg total) by mouth 3 (three) times daily before meals. 30 capsule 0  . diltiazem (CARTIA XT) 180 MG 24 hr capsule Take 1 capsule (180 mg total) by mouth daily. 90 capsule 0  . fluticasone (FLONASE) 50 MCG/ACT nasal spray Place 2 sprays into both nostrils daily. 16 g 1  . HYDROcodone-acetaminophen (NORCO) 10-325 MG tablet Take 1 tablet by mouth every 8 (eight) hours as needed. 65 tablet 0  .  lactobacillus acidophilus (BACID) TABS tablet Take 2 tablets by mouth 3 (three) times daily.    . meloxicam (MOBIC) 7.5 MG tablet Take 1 tablet (7.5 mg total) by mouth daily. 30 tablet 0  . omeprazole (PRILOSEC) 40 MG capsule Take 1 capsule by mouth 2 times daily before meals.  Take 30 min before breakfast and dinner. 60 capsule 6  . ondansetron (ZOFRAN-ODT) 4 MG disintegrating tablet Take 1 tablet (4 mg total) by mouth every 8 (eight) hours as needed for nausea or vomiting. 30 tablet 0  . promethazine (PHENERGAN) 25 MG tablet TAKE 1 TABLET BY MOUTH THREE TIMES DAILY AS NEEDED FOR NAUSEA 30 tablet 1  . ranitidine (ZANTAC) 300 MG tablet Take 1 tablet (300 mg total) by mouth at bedtime. 90 tablet 0   No current facility-administered medications for this visit.    Allergies:   Sulfamethoxazole; Amitriptyline; Augmentin; Iohexol; and Iodinated diagnostic agents   Social History:  The patient  reports that she quit smoking about 20 years ago. She has never used smokeless tobacco. She reports that she does not drink alcohol or use illicit drugs.   Family History:  The patient's  family history includes Alcohol abuse in her daughter; Allergies in her sister, son, and son; Aneurysm in her paternal grandfather; Arthritis in her brother, son, son, and son; Breast cancer (age of onset: 26) in her paternal grandmother; Breast cancer (age of onset: 3) in her maternal grandmother; Colon cancer in her cousin, maternal aunt, maternal grandmother, mother, and mother; Colon polyps in her father; Diabetes in her mother and paternal grandmother; Heart disease in her paternal grandfather; Hypertension in her mother, paternal grandfather, and son; Irritable bowel syndrome in her son; Osteoporosis in her son and son; Ovarian cancer in her cousin; Parkinson's disease in her father.    ROS:  Please see the history of present illness.   Positive for nausea, constipation, abdominal bloating. All other systems are reviewed  and negative.    PHYSICAL EXAM: VS:  BP 134/76 mmHg  Pulse 70  Ht 5\' 6"  (1.676 m)  Wt 74.118 kg (163 lb 6.4 oz)  BMI 26.39 kg/m2 , BMI Body mass index is 26.39 kg/(m^2). GEN: Well nourished, well developed, in no acute distress HEENT: normal Neck: no JVD, no masses. No carotid bruits Cardiac: RRR with 2/6 ejection murmur at the right upper sternal border              Respiratory:  clear to auscultation bilaterally, normal work of breathing GI: soft, nontender, nondistended, + BS MS: no deformity or atrophy Ext: no pretibial edema, pedal pulses 2+= bilaterally Skin: warm and dry, no rash Neuro:  Strength and sensation are intact Psych: euthymic mood, full affect  EKG:  EKG is not ordered today.  Recent Labs: 08/24/2015: TSH  1.11 09/06/2015: ALT 12; BUN 6; Creatinine, Ser 0.67; Hemoglobin 13.6; Platelets 346.0; Potassium 4.1; Sodium 139   Lipid Panel     Component Value Date/Time   CHOL 215* 04/16/2015 0919   TRIG 134.0 04/16/2015 0919   HDL 45.70 04/16/2015 0919   CHOLHDL 5 04/16/2015 0919   VLDL 26.8 04/16/2015 0919   LDLCALC 142* 04/16/2015 0919   LDLDIRECT 165.0 06/30/2011 1153      Wt Readings from Last 3 Encounters:  10/01/15 74.118 kg (163 lb 6.4 oz)  09/06/15 74.447 kg (164 lb 2 oz)  08/24/15 74.118 kg (163 lb 6.4 oz)     Cardiac Studies Reviewed: 2D Echo June 11, 2015: Study Conclusions  - Left ventricle: The cavity size was normal. Wall thickness was increased in a pattern of mild LVH. Systolic function was normal. The estimated ejection fraction was in the range of 60% to 65%. Wall motion was normal; there were no regional wall motion abnormalities. Left ventricular diastolic function parameters were normal. - Mitral valve: Mildly thickened leaflets . There was trivial regurgitation. - Left atrium: The atrium was normal in size. - Tricuspid valve: There was moderate regurgitation. - Pulmonary arteries: The main pulmonary artery is  dilated. PA peak pressure: 41 mm Hg (S). - Inferior vena cava: The vessel was normal in size. The respirophasic diameter changes were in the normal range (= 50%), consistent with normal central venous pressure.  Impressions:  - Compared to the prior echo in 2015, the degree of TR is moderate. RVSP is 41 mmHg, otherwise, no significant changes.  Renal artery Duplex 06-15-2015: IMPRESSION: 1. No evidence of hemodynamically significant stenosis in the proximal or mid renal arteries bilaterally. 2. There is questionable significance elevation of the peak systolic velocity in the more distal left renal artery in the region of the hilum. However, the vessels are often tortuous in this anatomic region and this velocity elevation may be exaggerated. 3. Normal sonographic appearance of the kidneys, no renal size discrepancy, hydronephrosis or nephrolithiasis.  ASSESSMENT AND PLAN: 1.  Essential HTN: Blood pressure in range today. Recommend the patient resume carvedilol at a lower dose of 6.25 mg twice daily. Otherwise continue current medical program.  2. Moderate tricuspid regurgitation: Most recent echo study reviewed. Estimated PA systolic pressure borderline. NYHA functional class I symptoms. Repeat echo in one year.  3. Chest pain: Resolved, past stress testing negative  4. Heart palpitations: Unclear etiology. Recommend 48-hour Holter monitor.  Overall she appears stable from a cardiac perspective. She is currently undergoing extensive GI evaluation. I advised a trial of yoga as I think a lot of her symptoms are related to stress.  Current medicines are reviewed with the patient today.  The patient does not have concerns regarding medicines.  Labs/ tests ordered today include:   Orders Placed This Encounter  Procedures  . Holter monitor - 48 hour  . Echocardiogram    Disposition:   FU one year with an echo prior to that office visit  Signed, Sherren Mocha, MD    10/01/2015 1:22 PM    [facility], [place], [place]  [address] Phone: [phone]; Fax: [phone]

## 2015-10-01 NOTE — Patient Instructions (Signed)
Medication Instructions:  Your physician has recommended you make the following change in your medication:  1. START Carvedilol 6.25mg  take one tablet by mouth twice a day  Labwork: No new orders.   Testing/Procedures: Your physician has recommended that you wear a 48 hour holter monitor. Holter monitors are medical devices that record the heart's electrical activity. Doctors most often use these monitors to diagnose arrhythmias. Arrhythmias are problems with the speed or rhythm of the heartbeat. The monitor is a small, portable device. You can wear one while you do your normal daily activities. This is usually used to diagnose what is causing palpitations/syncope (passing out).  Your physician has requested that you have an echocardiogram (need in 1 year). Echocardiography is a painless test that uses sound waves to create images of your heart. It provides your doctor with information about the size and shape of your heart and how well your heart's chambers and valves are working. This procedure takes approximately one hour. There are no restrictions for this procedure.  Follow-Up: Your physician wants you to follow-up in: 1 YEAR with Dr Burt Knack.  You will receive a reminder letter in the mail two months in advance. If you don't receive a letter, please call our office to schedule the follow-up appointment.   Any Other Special Instructions Will Be Listed Below (If Applicable).     If you need a refill on your cardiac medications before your next appointment, please call your pharmacy.

## 2015-10-02 ENCOUNTER — Telehealth: Payer: Self-pay | Admitting: Internal Medicine

## 2015-10-02 NOTE — Telephone Encounter (Signed)
1/19  Incoming referral for transfer of care from [facility].  Former Dr. Sharlett Iles patient.  Dr. Henrene Pastor was DOD on 09/06/2015 - records printed and routed to MD for review.

## 2015-10-03 ENCOUNTER — Ambulatory Visit (INDEPENDENT_AMBULATORY_CARE_PROVIDER_SITE_OTHER): Payer: Commercial Managed Care - HMO

## 2015-10-03 DIAGNOSIS — R002 Palpitations: Secondary | ICD-10-CM | POA: Diagnosis not present

## 2015-10-08 NOTE — Telephone Encounter (Signed)
Put on Dr. Blanch Media desk for review

## 2015-10-11 DIAGNOSIS — F419 Anxiety disorder, unspecified: Secondary | ICD-10-CM | POA: Diagnosis not present

## 2015-10-11 DIAGNOSIS — J449 Chronic obstructive pulmonary disease, unspecified: Secondary | ICD-10-CM | POA: Diagnosis not present

## 2015-10-11 DIAGNOSIS — J342 Deviated nasal septum: Secondary | ICD-10-CM | POA: Diagnosis not present

## 2015-10-11 DIAGNOSIS — J329 Chronic sinusitis, unspecified: Secondary | ICD-10-CM | POA: Diagnosis not present

## 2015-10-11 DIAGNOSIS — K6289 Other specified diseases of anus and rectum: Secondary | ICD-10-CM | POA: Diagnosis not present

## 2015-10-11 DIAGNOSIS — K589 Irritable bowel syndrome without diarrhea: Secondary | ICD-10-CM | POA: Diagnosis not present

## 2015-10-11 DIAGNOSIS — I10 Essential (primary) hypertension: Secondary | ICD-10-CM | POA: Diagnosis not present

## 2015-10-11 DIAGNOSIS — K209 Esophagitis, unspecified: Secondary | ICD-10-CM | POA: Diagnosis not present

## 2015-10-11 DIAGNOSIS — K2210 Ulcer of esophagus without bleeding: Secondary | ICD-10-CM | POA: Diagnosis not present

## 2015-10-11 DIAGNOSIS — K30 Functional dyspepsia: Secondary | ICD-10-CM | POA: Diagnosis not present

## 2015-10-11 DIAGNOSIS — J45909 Unspecified asthma, uncomplicated: Secondary | ICD-10-CM | POA: Diagnosis not present

## 2015-10-11 DIAGNOSIS — K210 Gastro-esophageal reflux disease with esophagitis: Secondary | ICD-10-CM | POA: Diagnosis not present

## 2015-10-15 ENCOUNTER — Ambulatory Visit: Payer: Commercial Managed Care - HMO | Admitting: Family Medicine

## 2015-10-19 ENCOUNTER — Other Ambulatory Visit: Payer: Self-pay | Admitting: Family Medicine

## 2015-10-19 DIAGNOSIS — M25579 Pain in unspecified ankle and joints of unspecified foot: Secondary | ICD-10-CM

## 2015-10-19 MED ORDER — HYDROCODONE-ACETAMINOPHEN 10-325 MG PO TABS: 1.0000 | ORAL_TABLET | Freq: Three times a day (TID) | ORAL | Status: DC | PRN

## 2015-10-19 NOTE — Telephone Encounter (Signed)
Requesting:Hydrocodone Contract  09/03/15 UDS moderate Last OV 09/06/2015 Last Refill  #65 with 0 refills on 09/21/2015  Please Advise

## 2015-10-19 NOTE — Telephone Encounter (Signed)
Called the patient to pickup hardcopy at the front desk.

## 2015-10-19 NOTE — Telephone Encounter (Signed)
Relation to [id number] Call back number:[phone] Pharmacy:  Reason for call:  Patient requesting a refill HYDROcodone-acetaminophen (NORCO) 10-325 MG tablet

## 2015-10-22 MED FILL — HYDROCODON-APAP 10-325: 10-325 | 21 days supply | Qty: 65 | Fill #0

## 2015-10-26 ENCOUNTER — Other Ambulatory Visit: Payer: Self-pay | Admitting: Family Medicine

## 2015-10-26 MED FILL — FLUTICASONE PROP 50 MCG SPR: 50 | 30 days supply | Qty: 16 | Fill #1

## 2015-10-26 MED FILL — DILTIAZEM 24HR ER 180 MG CA: 180 | 90 days supply | Qty: 90 | Fill #0 | Status: TO

## 2015-10-26 MED FILL — raNITIdine HCL 300 MG TABS: 300 | 90 days supply | Qty: 90 | Fill #0

## 2015-10-30 ENCOUNTER — Other Ambulatory Visit: Payer: Self-pay | Admitting: Family Medicine

## 2015-10-30 MED FILL — ALPRAZolam 2 MG TABS: 2 | 30 days supply | Qty: 60 | Fill #0

## 2015-10-30 NOTE — Telephone Encounter (Signed)
Faxed hardcopy for alprazolam to [facility].

## 2015-11-02 ENCOUNTER — Ambulatory Visit (INDEPENDENT_AMBULATORY_CARE_PROVIDER_SITE_OTHER): Payer: Commercial Managed Care - HMO | Admitting: Family Medicine

## 2015-11-02 ENCOUNTER — Encounter: Payer: Self-pay | Admitting: Family Medicine

## 2015-11-02 VITALS — BP 122/70 | HR 87 | Temp 98.0°F | Ht 66.0 in | Wt 151.0 lb

## 2015-11-02 DIAGNOSIS — K219 Gastro-esophageal reflux disease without esophagitis: Secondary | ICD-10-CM | POA: Diagnosis not present

## 2015-11-02 DIAGNOSIS — R739 Hyperglycemia, unspecified: Secondary | ICD-10-CM | POA: Diagnosis not present

## 2015-11-02 DIAGNOSIS — K449 Diaphragmatic hernia without obstruction or gangrene: Secondary | ICD-10-CM

## 2015-11-02 DIAGNOSIS — E782 Mixed hyperlipidemia: Secondary | ICD-10-CM

## 2015-11-02 DIAGNOSIS — I10 Essential (primary) hypertension: Secondary | ICD-10-CM | POA: Diagnosis not present

## 2015-11-02 DIAGNOSIS — T7840XD Allergy, unspecified, subsequent encounter: Secondary | ICD-10-CM

## 2015-11-02 DIAGNOSIS — I340 Nonrheumatic mitral (valve) insufficiency: Secondary | ICD-10-CM

## 2015-11-02 NOTE — Assessment & Plan Note (Signed)
Encouraged heart healthy diet, increase exercise, avoid trans fats, consider a krill oil cap daily 

## 2015-11-02 NOTE — Assessment & Plan Note (Addendum)
Follows with cardiology, moderate MR noted on echo Recent Holter PVCs, PACs and short runs pSVT, encouraged to follow up with cardiology if symptoms worsen

## 2015-11-02 NOTE — Assessment & Plan Note (Signed)
Well controlled, no changes to meds. Encouraged heart healthy diet such as the DASH diet and exercise as tolerated.  °

## 2015-11-02 NOTE — Progress Notes (Signed)
Pre visit review using our clinic review tool, if applicable. No additional management support is needed unless otherwise documented below in the visit note. 

## 2015-11-02 NOTE — Progress Notes (Signed)
Patient ID: Megan [name], female   DOB: 1951/02/23, 65 y.o.   MRN: [id number]   Subjective:    Patient ID: Megan [name], female    DOB: 31-Dec-1950, 65 y.o.   MRN: [id number]  Chief Complaint  Patient presents with  . Follow-up    7 weeks. htn, gerd,     HPI Patient is in today for follow up on numerous concerns. She is having persistent reflux, epigastric pain and intermittent loose stool no bloody or tarry stool. No fevers, chills. No vomiting. She is struggling with palpitations which sometimes wake her up. No diaphoresis. She is still anxious and struggling with a bad marriage. Denies SOB/HA/congestion/fevers or GU c/o. Taking meds as prescribed  Past Medical History  Diagnosis Date  . GERD (gastroesophageal reflux disease)   . Depression   . Anxiety   . Insomnia   . IBS (irritable bowel syndrome)   . Diverticulosis   . Barrett esophagus   . Hiatal hernia   . Unspecified menopausal and postmenopausal disorder   . Vitamin B12 deficiency   . Blind loop syndrome   . Hepatic cyst   . Unspecified hypothyroidism   . Diarrhea   . Family history of malignant neoplasm of gastrointestinal tract     multiple members  . Endometrial hyperplasia 02/27/2006    BENIGN ENDO BX ON 02/2007  . C. difficile diarrhea   . Cystocele, midline   . Rectocele   . Uterine prolapse without mention of vaginal wall prolapse   . Chest pain, atypical 07/14/2011  . Leaky heart valve   . Hiatal hernia   . 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 Thrush 07/21/2011  . Rectal bleeding 10/10/2011  . Sinusitis acute 02/03/2012  . Contact dermatitis 03/23/2012  . Headache(784.0) 04/15/2012  . 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 Hematuria 04/27/2012  . Allergic state 12/31/2012  . 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 Paronychia of great toe, left 06/19/2013  . Low back pain 08/15/2013  . Osteoporosis 05/2011    T score-2.5 Lt femoral neck 2012, T score -2.4 2016 FRAX 12%/2.4%  . 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 Hypertension   . Asthma   . 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 Preventative health care 09/11/2014  . Hyperlipidemia, mixed 12/17/2014  . Colon polyp   . Headache 04/16/2015    Past Surgical History  Procedure Laterality Date  . Appendectomy    . Uterine fibroid surgery    . Hysteroscopy      POLYP    Family History  Problem Relation Age of Onset  . Colon cancer Mother   . Diabetes Mother   . Hypertension Mother   . Colon cancer Mother   . Colon polyps Father   . Parkinson's disease Father   . Colon cancer Maternal Aunt   . Colon cancer Maternal Grandmother   . Breast cancer Maternal Grandmother 23  . Diabetes Paternal Grandmother   . Breast cancer Paternal Grandmother 60  . Colon cancer Cousin     X3  . Ovarian cancer Cousin   . Allergies Sister   . Arthritis Brother     b/l hip replace  . Alcohol abuse Daughter   . Arthritis Son   . Hypertension Son   . Allergies Son   . Osteoporosis Son   . Osteoporosis Son   . Arthritis Son     back disease, DDD  . Allergies Son   . Arthritis Son   . Irritable bowel syndrome Son  RSD  . Hypertension Paternal Grandfather   . Heart disease Paternal Grandfather   . Aneurysm Paternal Grandfather     Social History   Social History  . Marital Status: Married    Spouse Name: N/A  . Number of Children: 4  . Years of Education: N/A   Occupational History  . Retired    Social History Main Topics  . Smoking status: Former Smoker    Quit date: 08/19/1995  . Smokeless tobacco: Never Used  . Alcohol Use: No  . Drug Use: No  . Sexual Activity: Not Currently    Birth Control/ Protection: Post-menopausal     Comment: 1st intercourse 7 yo-5 partners   Other Topics Concern  . Not on file   Social History Narrative    Outpatient Prescriptions Prior to Visit  Medication Sig Dispense Refill  . albuterol (PROVENTIL HFA;VENTOLIN HFA) 108 (90 Base) MCG/ACT inhaler Inhale 2 puffs into the lungs every 6 (six) hours  as needed for wheezing or shortness of breath.    . alprazolam (XANAX) 2 MG tablet TAKE 1 TABLET BY MOUTH TWICE DAILY AS NEEDED FOR SLEEP 60 tablet 0  . aspirin 81 MG tablet Take 81 mg by mouth daily.    . carvedilol (COREG) 6.25 MG tablet Take 1 tablet (6.25 mg total) by mouth 2 (two) times daily. 180 tablet 3  . cyclobenzaprine (FLEXERIL) 10 MG tablet Take 1 tablet (10 mg total) by mouth 3 (three) times daily as needed for muscle spasms. 40 tablet 1  . dicyclomine (BENTYL) 10 MG capsule Take 1 capsule (10 mg total) by mouth 3 (three) times daily before meals. 30 capsule 0  . diltiazem (CARDIZEM CD) 180 MG 24 hr capsule TAKE ONE CAPSULE BY MOUTH DAILY 90 capsule 1  . fluticasone (FLONASE) 50 MCG/ACT nasal spray Place 2 sprays into both nostrils daily. 16 g 1  . HYDROcodone-acetaminophen (NORCO) 10-325 MG tablet Take 1 tablet by mouth every 8 (eight) hours as needed. 65 tablet 0  . lactobacillus acidophilus (BACID) TABS tablet Take 2 tablets by mouth 3 (three) times daily.    . meloxicam (MOBIC) 7.5 MG tablet Take 1 tablet (7.5 mg total) by mouth daily. 30 tablet 0  . omeprazole (PRILOSEC) 40 MG capsule Take 1 capsule by mouth 2 times daily before meals.  Take 30 min before breakfast and dinner. 60 capsule 6  . ondansetron (ZOFRAN-ODT) 4 MG disintegrating tablet Take 1 tablet (4 mg total) by mouth every 8 (eight) hours as needed for nausea or vomiting. 30 tablet 0  . promethazine (PHENERGAN) 25 MG tablet TAKE 1 TABLET BY MOUTH THREE TIMES DAILY AS NEEDED FOR NAUSEA 30 tablet 1  . ranitidine (ZANTAC) 300 MG tablet TAKE 1 TABLET BY MOUTH AT BEDTIME 90 tablet 1   No facility-administered medications prior to visit.    Allergies  Allergen Reactions  . Sulfamethoxazole Shortness Of Breath    chest tightness  . Amitriptyline Anxiety and Other (See Comments)    Depression, insomnia  . Augmentin [Amoxicillin-Pot Clavulanate] Diarrhea  . Iohexol     unknown  . Iodinated Diagnostic Agents     Pt  states at Dr.Grapeys office 15 years ago blacked out for 2 hours during injection for IVP. Was told never to have IV dye again.     Review of Systems  Constitutional: Positive for malaise/fatigue. Negative for fever.  HENT: Negative for congestion.   Eyes: Negative for blurred vision.  Respiratory: Negative for shortness of breath.  Cardiovascular: Negative for chest pain, palpitations and leg swelling.  Gastrointestinal: Positive for heartburn and abdominal pain. Negative for nausea and blood in stool.  Genitourinary: Negative for dysuria and frequency.  Musculoskeletal: Negative for falls.  Skin: Negative for rash.  Neurological: Negative for dizziness, loss of consciousness and headaches.  Endo/Heme/Allergies: Negative for environmental allergies.  Psychiatric/Behavioral: Negative for depression. The patient is nervous/anxious.        Objective:    Physical Exam  Constitutional: She is oriented to person, place, and time. She appears well-developed and well-nourished. No distress.  HENT:  Head: Normocephalic and atraumatic.  Nose: Nose normal.  oropharynx  Eyes: Right eye exhibits no discharge. Left eye exhibits no discharge.  Neck: Normal range of motion. Neck supple.  Cardiovascular: Normal rate and regular rhythm.   Murmur heard. Pulmonary/Chest: Effort normal and breath sounds normal.  Abdominal: Soft. Bowel sounds are normal. There is no tenderness.  Musculoskeletal: She exhibits no edema.  Neurological: She is alert and oriented to person, place, and time.  Skin: Skin is warm and dry.  Psychiatric: She has a normal mood and affect.  Nursing note and vitals reviewed.   BP 122/70 mmHg  Pulse 87  Temp(Src) 98 F (36.7 C) (Oral)  Ht 5\' 6"  (1.676 m)  Wt 151 lb (68.493 kg)  BMI 24.38 kg/m2  SpO2 98% Wt Readings from Last 3 Encounters:  11/02/15 151 lb (68.493 kg)  10/01/15 163 lb 6.4 oz (74.118 kg)  09/06/15 164 lb 2 oz (74.447 kg)     Lab Results    Component Value Date   WBC 8.4 09/06/2015   HGB 13.6 09/06/2015   HCT 41.4 09/06/2015   PLT 346.0 09/06/2015   GLUCOSE 83 09/06/2015   CHOL 215* 04/16/2015   TRIG 134.0 04/16/2015   HDL 45.70 04/16/2015   LDLDIRECT 165.0 06/30/2011   LDLCALC 142* 04/16/2015   ALT 12 09/06/2015   AST 14 09/06/2015   NA 139 09/06/2015   K 4.1 09/06/2015   CL 102 09/06/2015   CREATININE 0.67 09/06/2015   BUN 6 09/06/2015   CO2 28 09/06/2015   TSH 1.11 08/24/2015   HGBA1C 5.7 04/16/2015    Lab Results  Component Value Date   TSH 1.11 08/24/2015   Lab Results  Component Value Date   WBC 8.4 09/06/2015   HGB 13.6 09/06/2015   HCT 41.4 09/06/2015   MCV 93.8 09/06/2015   PLT 346.0 09/06/2015   Lab Results  Component Value Date   NA 139 09/06/2015   K 4.1 09/06/2015   CO2 28 09/06/2015   GLUCOSE 83 09/06/2015   BUN 6 09/06/2015   CREATININE 0.67 09/06/2015   BILITOT 0.4 09/06/2015   ALKPHOS 100 09/06/2015   AST 14 09/06/2015   ALT 12 09/06/2015   PROT 7.6 09/06/2015   ALBUMIN 4.3 09/06/2015   CALCIUM 9.2 09/06/2015   ANIONGAP 9 05/28/2015   GFR 93.85 09/06/2015   Lab Results  Component Value Date   CHOL 215* 04/16/2015   Lab Results  Component Value Date   HDL 45.70 04/16/2015   Lab Results  Component Value Date   LDLCALC 142* 04/16/2015   Lab Results  Component Value Date   TRIG 134.0 04/16/2015   Lab Results  Component Value Date   CHOLHDL 5 04/16/2015   Lab Results  Component Value Date   HGBA1C 5.7 04/16/2015       Assessment & Plan:   Problem List Items Addressed This Visit  Allergic state    Encouraged to try Cetirizine 10 mg daily, see if that helps      Essential hypertension    Well controlled, no changes to meds. Encouraged heart healthy diet such as the DASH diet and exercise as tolerated.       Gastroesophageal reflux disease with hiatal hernia - Primary    Avoid offending foods, start probiotics. Do not eat large meals in late evening  and consider raising head of bed. Has had an UGI at [facility] in 2/17. Esophagitis and gastritis noted they are planning to proceed with H Pylori breath test. Try the NOW probiotics      Hyperglycemia    minimize simple carbs. Increase exercise as tolerated.       Hyperlipidemia, mixed    Encouraged heart healthy diet, increase exercise, avoid trans fats, consider a krill oil cap daily      Mitral regurgitation    Follows with cardiology, moderate MR noted on echo Recent Holter PVCs, PACs and short runs pSVT, encouraged to follow up with cardiology if symptoms worsen         I am having Ms. Kirchner maintain her lactobacillus acidophilus, dicyclomine, aspirin, promethazine, ondansetron, fluticasone, cyclobenzaprine, meloxicam, omeprazole, albuterol, carvedilol, HYDROcodone-acetaminophen, diltiazem, ranitidine, and alprazolam.  No orders of the defined types were placed in this encounter.     Penni Homans, MD

## 2015-11-02 NOTE — Assessment & Plan Note (Signed)
Encouraged to try Cetirizine 10 mg daily, see if that helps

## 2015-11-02 NOTE — Assessment & Plan Note (Signed)
minimize simple carbs. Increase exercise as tolerated.  

## 2015-11-02 NOTE — Patient Instructions (Signed)
  [place] probiotic daily, 10 strains in 1 cap  Food Choices for Gastroesophageal Reflux Disease, Adult When you have gastroesophageal reflux disease (GERD), the foods you eat and your eating habits are very important. Choosing the right foods can help ease your discomfort.  WHAT GUIDELINES DO I NEED TO FOLLOW?   Choose fruits, vegetables, whole grains, and low-fat dairy products.   Choose low-fat meat, fish, and poultry.  Limit fats such as oils, salad dressings, butter, nuts, and avocado.   Keep a food diary. This helps you identify foods that cause symptoms.   Avoid foods that cause symptoms. These may be different for everyone.   Eat small meals often instead of 3 large meals a day.   Eat your meals slowly, in a place where you are relaxed.   Limit fried foods.   Cook foods using methods other than frying.   Avoid drinking alcohol.   Avoid drinking large amounts of liquids with your meals.   Avoid bending over or lying down until 2-3 hours after eating.  WHAT FOODS ARE NOT RECOMMENDED?  These are some foods and drinks that may make your symptoms worse: Vegetables Tomatoes. Tomato juice. Tomato and spaghetti sauce. Chili peppers. Onion and garlic. Horseradish. Fruits Oranges, grapefruit, and lemon (fruit and juice). Meats High-fat meats, fish, and poultry. This includes hot dogs, ribs, ham, sausage, salami, and bacon. Dairy Whole milk and chocolate milk. Sour cream. Cream. Butter. Ice cream. Cream cheese.  Drinks Coffee and tea. Bubbly (carbonated) drinks or energy drinks. Condiments Hot sauce. Barbecue sauce.  Sweets/Desserts Chocolate and cocoa. Donuts. Peppermint and spearmint. Fats and Oils High-fat foods. This includes [place] fries and potato chips. Other Vinegar. Strong spices. This includes black pepper, white pepper, red pepper, cayenne, curry powder, cloves, ginger, and chili powder. The items listed above may not be a complete list of foods  and drinks to avoid. Contact your dietitian for more information.   This information is not intended to replace advice given to you by your health care provider. Make sure you discuss any questions you have with your health care provider.   Document Released: 02/03/2012 Document Revised: 08/25/2014 Document Reviewed: 06/08/2013 Elsevier Interactive Patient Education [employer].

## 2015-11-02 NOTE — Assessment & Plan Note (Addendum)
Has not been taking the vitamin B12 due to numbers hi will recheck at next visit

## 2015-11-02 NOTE — Assessment & Plan Note (Addendum)
Avoid offending foods, start probiotics. Do not eat large meals in late evening and consider raising head of bed. Has had an UGI at [facility] in 2/17. Esophagitis and gastritis noted they are planning to proceed with H Pylori breath test. Try the NOW probiotics

## 2015-11-14 ENCOUNTER — Ambulatory Visit (INDEPENDENT_AMBULATORY_CARE_PROVIDER_SITE_OTHER): Payer: Commercial Managed Care - HMO | Admitting: Family Medicine

## 2015-11-14 VITALS — BP 138/78 | HR 66 | Temp 98.8°F | Ht 66.0 in | Wt 158.0 lb

## 2015-11-14 DIAGNOSIS — R590 Localized enlarged lymph nodes: Secondary | ICD-10-CM

## 2015-11-14 DIAGNOSIS — R599 Enlarged lymph nodes, unspecified: Secondary | ICD-10-CM

## 2015-11-14 LAB — CBC WITH DIFFERENTIAL/PLATELET
BASOS ABS: 0 10*3/uL (ref 0.0–0.1)
Basophils Relative: 0.4 % (ref 0.0–3.0)
EOS ABS: 0.1 10*3/uL (ref 0.0–0.7)
Eosinophils Relative: 1 % (ref 0.0–5.0)
HCT: 40 % (ref 36.0–46.0)
Hemoglobin: 13.4 g/dL (ref 12.0–15.0)
LYMPHS ABS: 2.9 10*3/uL (ref 0.7–4.0)
Lymphocytes Relative: 38.9 % (ref 12.0–46.0)
MCHC: 33.6 g/dL (ref 30.0–36.0)
MCV: 91.9 fl (ref 78.0–100.0)
MONOS PCT: 8.5 % (ref 3.0–12.0)
Monocytes Absolute: 0.6 10*3/uL (ref 0.1–1.0)
NEUTROS ABS: 3.9 10*3/uL (ref 1.4–7.7)
NEUTROS PCT: 51.2 % (ref 43.0–77.0)
PLATELETS: 335 10*3/uL (ref 150.0–400.0)
RBC: 4.35 Mil/uL (ref 3.87–5.11)
RDW: 13.1 % (ref 11.5–15.5)
WBC: 7.6 10*3/uL (ref 4.0–10.5)

## 2015-11-14 LAB — SEDIMENTATION RATE: SED RATE: 16 mm/h (ref 0–22)

## 2015-11-14 LAB — POCT RAPID STREP A (OFFICE): RAPID STREP A SCREEN: NEGATIVE

## 2015-11-14 MED ORDER — AMOXICILLIN 500 MG PO CAPS: 1000.0000 mg | ORAL_CAPSULE | Freq: Two times a day (BID) | ORAL | Status: DC

## 2015-11-14 MED FILL — AMOXICILLIN 500 MG CAPSULE: 500 | 10 days supply | Qty: 40 | Fill #0

## 2015-11-14 NOTE — Patient Instructions (Addendum)
I will be in touch with your labs asap Let me know if you do not feel improved by the end of the week- Sooner if worse.   We are going to treat you with amoxicillin for possible sinus/ ear or gum infection Use a probiotic as well while you are on this medication

## 2015-11-14 NOTE — Progress Notes (Signed)
[place] [address] [place], [place] [address] [phone]  Date:  11/14/2015   Name:  Megan [name]   DOB:  1951/04/08   MRN:  [id number]  PCP:  Penni Homans, MD    Chief Complaint: Adenopathy   History of Present Illness:  Megan [name] is a 65 y.o. very pleasant female patient who presents with the following:  Pt of Dr. Charlett Blake, here today with concern of a pain in her right sided neck glands, less so on the left. Her ear looked ok when she was here on 3/17.  She did recently undergo an upper GI and was round to have esophagitis.  She continues to note swelling in her neck glands, "they ache all day like a toothache." it will get worse at night and she will even get a sweat sometimes  Last night she felt nauseated and took phenergan- this usually helps but did not help this time Both of her ears hurt, but more on the right.   She has not measured a fever but does have sweats and chills.    She does notice a ST off an on.   No vomiting She does not have any tooth pain that she has noticed   Patient Active Problem List   Diagnosis Date Noted  . Acute bronchitis 09/16/2015  . Chronic low back pain 08/24/2015  . Memory loss 08/24/2015  . Chest pain 06/04/2015  . Atypical chest pain 05/30/2015  . Right flank pain 05/30/2015  . New onset of headaches after age 47 04/26/2015  . Headache 04/16/2015  . Right hip pain 02/04/2015  . Change in bowel habits 12/17/2014  . Hyperlipidemia, mixed 12/17/2014  . Preventative health care 09/11/2014  . 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 Hepatic cyst 05/22/2014  . Pain in joint, ankle and foot 03/10/2014  . Insomnia 03/10/2014  . Other malaise and fatigue 03/10/2014  . Sinusitis 12/07/2013  . Low back pain 08/15/2013  . Chronic cough 08/09/2013  . 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 Allergic state 12/31/2012  . Osteoporosis 09/07/2012  . Sacroiliac joint disease  04/27/2012  . Hematuria 04/27/2012  . Contact dermatitis 03/23/2012  . 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 Mitral regurgitation 07/21/2011  . Abdominal pain 06/17/2011  . Gastroesophageal reflux disease with hiatal hernia 06/17/2011  . Endometrial hyperplasia   . Diverticulosis   . C. difficile colitis 04/24/2011  . IBS (irritable bowel syndrome) 04/24/2011  . Barrett's esophagus 03/04/2011  . Hiatal hernia 03/04/2011  . BASAL CELL CARCINOMA, FACE 11/15/2009  . Migraine 11/15/2009  . WEIGHT GAIN 06/05/2009  . Hyperglycemia 06/05/2009  . VITAMIN B12 DEFICIENCY 02/15/2009  . 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 FLATULENCE-GAS-BLOATING 01/26/2009  . Essential hypertension 01/04/2009  . DIARRHEA, CHRONIC 12/05/2008  . ANXIETY DEPRESSION 12/14/2007    Past Medical History  Diagnosis Date  . GERD (gastroesophageal reflux disease)   . Depression   . Anxiety   . Insomnia   . IBS (irritable bowel syndrome)   . Diverticulosis   . Barrett esophagus   . Hiatal hernia   . Unspecified menopausal and postmenopausal disorder   . Vitamin B12 deficiency   . Blind loop syndrome   . Hepatic cyst   . Unspecified hypothyroidism   . Diarrhea   . Family history of malignant neoplasm of gastrointestinal tract     multiple members  . Endometrial hyperplasia 02/27/2006    BENIGN ENDO BX ON  02/2007  . C. difficile diarrhea   . Cystocele, midline   . Rectocele   . Uterine prolapse without mention of vaginal wall prolapse   . Chest pain, atypical 07/14/2011  . Leaky heart valve   . Hiatal hernia   . 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 Thrush 07/21/2011  . Rectal bleeding 10/10/2011  . Sinusitis acute 02/03/2012  . Contact dermatitis 03/23/2012  . Headache(784.0) 04/15/2012  . 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 Hematuria 04/27/2012  . Allergic state 12/31/2012  . 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 Paronychia of great toe, left 06/19/2013  . Low back pain 08/15/2013  . Osteoporosis 05/2011    T  score-2.5 Lt femoral neck 2012, T score -2.4 2016 FRAX 12%/2.4%  . Adrenal gland cyst ([place]) 05/22/2014  . Hypertension   . Asthma   . 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 Preventative health care 09/11/2014  . Hyperlipidemia, mixed 12/17/2014  . Colon polyp   . Headache 04/16/2015    Past Surgical History  Procedure Laterality Date  . Appendectomy    . Uterine fibroid surgery    . Hysteroscopy      POLYP    Social History  Substance Use Topics  . Smoking status: Former Smoker    Quit date: 08/19/1995  . Smokeless tobacco: Never Used  . Alcohol Use: No    Family History  Problem Relation Age of Onset  . Colon cancer Mother   . Diabetes Mother   . Hypertension Mother   . Colon cancer Mother   . Colon polyps Father   . Parkinson's disease Father   . Colon cancer Maternal Aunt   . Colon cancer Maternal Grandmother   . Breast cancer Maternal Grandmother 58  . Diabetes Paternal Grandmother   . Breast cancer Paternal Grandmother 57  . Colon cancer Cousin     X3  . Ovarian cancer Cousin   . Allergies Sister   . Arthritis Brother     b/l hip replace  . Alcohol abuse Daughter   . Arthritis Son   . Hypertension Son   . Allergies Son   . Osteoporosis Son   . Osteoporosis Son   . Arthritis Son     back disease, DDD  . Allergies Son   . Arthritis Son   . Irritable bowel syndrome Son     RSD  . Hypertension Paternal Grandfather   . Heart disease Paternal Grandfather   . Aneurysm Paternal Grandfather     Allergies  Allergen Reactions  . Sulfamethoxazole Shortness Of Breath    chest tightness  . Amitriptyline Anxiety and Other (See Comments)    Depression, insomnia  . Augmentin [Amoxicillin-Pot Clavulanate] Diarrhea  . Iohexol     unknown  . Iodinated Diagnostic Agents     Pt states at Dr.Grapeys office 15 years ago blacked out for 2 hours during injection for IVP. Was told never to have IV dye again.     Medication list has been reviewed and updated.  Current  Outpatient Prescriptions on File Prior to Visit  Medication Sig Dispense Refill  . albuterol (PROVENTIL HFA;VENTOLIN HFA) 108 (90 Base) MCG/ACT inhaler Inhale 2 puffs into the lungs every 6 (six) hours as needed for wheezing or shortness of breath.    . alprazolam (XANAX) 2 MG tablet TAKE 1 TABLET BY MOUTH TWICE DAILY AS NEEDED FOR SLEEP 60 tablet 0  . aspirin 81 MG tablet Take 81 mg by mouth daily.    . carvedilol (COREG) 6.25 MG tablet  Take 1 tablet (6.25 mg total) by mouth 2 (two) times daily. 180 tablet 3  . cyclobenzaprine (FLEXERIL) 10 MG tablet Take 1 tablet (10 mg total) by mouth 3 (three) times daily as needed for muscle spasms. 40 tablet 1  . dicyclomine (BENTYL) 10 MG capsule Take 1 capsule (10 mg total) by mouth 3 (three) times daily before meals. 30 capsule 0  . diltiazem (CARDIZEM CD) 180 MG 24 hr capsule TAKE ONE CAPSULE BY MOUTH DAILY 90 capsule 1  . fluticasone (FLONASE) 50 MCG/ACT nasal spray Place 2 sprays into both nostrils daily. 16 g 1  . HYDROcodone-acetaminophen (NORCO) 10-325 MG tablet Take 1 tablet by mouth every 8 (eight) hours as needed. 65 tablet 0  . lactobacillus acidophilus (BACID) TABS tablet Take 2 tablets by mouth 3 (three) times daily.    . meloxicam (MOBIC) 7.5 MG tablet Take 1 tablet (7.5 mg total) by mouth daily. 30 tablet 0  . omeprazole (PRILOSEC) 40 MG capsule Take 1 capsule by mouth 2 times daily before meals.  Take 30 min before breakfast and dinner. 60 capsule 6  . ondansetron (ZOFRAN-ODT) 4 MG disintegrating tablet Take 1 tablet (4 mg total) by mouth every 8 (eight) hours as needed for nausea or vomiting. 30 tablet 0  . promethazine (PHENERGAN) 25 MG tablet TAKE 1 TABLET BY MOUTH THREE TIMES DAILY AS NEEDED FOR NAUSEA 30 tablet 1  . ranitidine (ZANTAC) 300 MG tablet TAKE 1 TABLET BY MOUTH AT BEDTIME 90 tablet 1   No current facility-administered medications on file prior to visit.    Review of Systems:  As per HPI- otherwise negative. She was  exposed to strep per one of her 12 grandchlidren not long ago  Physical Examination: Filed Vitals:   11/14/15 1100  BP: 138/78  Pulse: 66  Temp: 98.8 F (37.1 C)   Filed Vitals:   11/14/15 1100  Height: '5\' 6"'$  (1.676 m)  Weight: 158 lb (71.668 kg)   Body mass index is 25.51 kg/(m^2). Ideal Body Weight: Weight in (lb) to have BMI = 25: 154.6  GEN: WDWN, NAD, Non-toxic, A & O x 3, looks well HEENT: Atraumatic, Normocephalic. Neck supple. No masses, No LAD. Ears and Nose: No external deformity. CV: RRR, No M/G/R. No JVD. No thrill. No extra heart sounds. PULM: CTA B, no wheezes, crackles, rhonchi. No retractions. No resp. distress. No accessory muscle use. ABD: S, NT, ND, +BS. No rebound. No HSM. EXTR: No c/c/e NEURO Normal gait.  PSYCH: Normally interactive. Conversant. Not depressed or anxious appearing.  Calm demeanor.  Mild enlargement of the submandibular nodes bilaterally.   Right ear drum is slightly dull compared to the left, but does not appear acutely infected.  She is missing several teeth but does not have appreciable tenderness when I press on her teeth  Assessment and Plan: LAD (lymphadenopathy), anterior cervical - Plan: CBC with Differential/Platelet, Sed Rate (ESR), POCT rapid strep A, amoxicillin (AMOXIL) 500 MG capsule  Here today with discomfort in her right ear and tender cervical nodes for a couple of weeks Will try treating her with a course of abx, check labs as above She is able to tolerate amox- diarrhea only with augmentin See patient instructions for more details.     Signed Lamar Blinks, MD

## 2015-11-20 ENCOUNTER — Other Ambulatory Visit: Payer: Self-pay | Admitting: Family Medicine

## 2015-11-20 DIAGNOSIS — M25579 Pain in unspecified ankle and joints of unspecified foot: Secondary | ICD-10-CM

## 2015-11-20 MED ORDER — HYDROCODONE-ACETAMINOPHEN 10-325 MG PO TABS: 1.0000 | ORAL_TABLET | Freq: Three times a day (TID) | ORAL | Status: DC | PRN

## 2015-11-20 NOTE — Telephone Encounter (Signed)
Printed and on counter for signature. Called the patient left msg. To pickup hardcopy.

## 2015-11-21 MED FILL — HYDROCODON-APAP 10-325: 10-325 | 21 days supply | Qty: 65 | Fill #0

## 2015-11-28 ENCOUNTER — Ambulatory Visit: Payer: Commercial Managed Care - HMO | Admitting: Family Medicine

## 2015-11-28 ENCOUNTER — Telehealth: Payer: Self-pay | Admitting: Family Medicine

## 2015-11-28 NOTE — Telephone Encounter (Signed)
Pt will not be here at 10:15am this morning. [place]. Charge or no charge?

## 2015-11-29 ENCOUNTER — Encounter: Payer: Self-pay | Admitting: Family Medicine

## 2015-11-29 NOTE — Telephone Encounter (Signed)
-----   Message from Darreld Mclean, MD sent at 11/28/2015  1:26 PM EDT ----- No charge

## 2015-11-29 NOTE — Telephone Encounter (Signed)
Waiving fee, mailing reminder letter °

## 2015-12-03 DIAGNOSIS — K869 Disease of pancreas, unspecified: Secondary | ICD-10-CM | POA: Diagnosis not present

## 2015-12-03 DIAGNOSIS — K219 Gastro-esophageal reflux disease without esophagitis: Secondary | ICD-10-CM | POA: Diagnosis not present

## 2015-12-03 DIAGNOSIS — R1013 Epigastric pain: Secondary | ICD-10-CM | POA: Diagnosis not present

## 2015-12-03 DIAGNOSIS — M940 Chondrocostal junction syndrome [Tietze]: Secondary | ICD-10-CM | POA: Diagnosis not present

## 2015-12-03 DIAGNOSIS — Z79899 Other long term (current) drug therapy: Secondary | ICD-10-CM | POA: Diagnosis not present

## 2015-12-03 DIAGNOSIS — K209 Esophagitis, unspecified: Secondary | ICD-10-CM | POA: Diagnosis not present

## 2015-12-13 ENCOUNTER — Other Ambulatory Visit: Payer: Self-pay | Admitting: Family Medicine

## 2015-12-14 ENCOUNTER — Other Ambulatory Visit: Payer: Self-pay | Admitting: Family Medicine

## 2015-12-14 ENCOUNTER — Encounter: Payer: Self-pay | Admitting: Gastroenterology

## 2015-12-14 ENCOUNTER — Ambulatory Visit (INDEPENDENT_AMBULATORY_CARE_PROVIDER_SITE_OTHER): Payer: Commercial Managed Care - HMO | Admitting: Family Medicine

## 2015-12-14 ENCOUNTER — Encounter: Payer: Self-pay | Admitting: Family Medicine

## 2015-12-14 VITALS — BP 132/70 | HR 86 | Temp 98.0°F | Ht 66.0 in | Wt 159.5 lb

## 2015-12-14 DIAGNOSIS — I10 Essential (primary) hypertension: Secondary | ICD-10-CM | POA: Diagnosis not present

## 2015-12-14 DIAGNOSIS — T7840XD Allergy, unspecified, subsequent encounter: Secondary | ICD-10-CM

## 2015-12-14 DIAGNOSIS — K869 Disease of pancreas, unspecified: Secondary | ICD-10-CM | POA: Diagnosis not present

## 2015-12-14 DIAGNOSIS — M25579 Pain in unspecified ankle and joints of unspecified foot: Secondary | ICD-10-CM | POA: Diagnosis not present

## 2015-12-14 DIAGNOSIS — E782 Mixed hyperlipidemia: Secondary | ICD-10-CM

## 2015-12-14 DIAGNOSIS — E278 Other specified disorders of adrenal gland: Secondary | ICD-10-CM | POA: Diagnosis not present

## 2015-12-14 DIAGNOSIS — R739 Hyperglycemia, unspecified: Secondary | ICD-10-CM

## 2015-12-14 DIAGNOSIS — K8689 Other specified diseases of pancreas: Secondary | ICD-10-CM | POA: Diagnosis not present

## 2015-12-14 DIAGNOSIS — K7689 Other specified diseases of liver: Secondary | ICD-10-CM | POA: Diagnosis not present

## 2015-12-14 DIAGNOSIS — Z0000 Encounter for general adult medical examination without abnormal findings: Secondary | ICD-10-CM

## 2015-12-14 DIAGNOSIS — R002 Palpitations: Secondary | ICD-10-CM | POA: Diagnosis not present

## 2015-12-14 DIAGNOSIS — K769 Liver disease, unspecified: Secondary | ICD-10-CM | POA: Diagnosis not present

## 2015-12-14 DIAGNOSIS — K589 Irritable bowel syndrome without diarrhea: Secondary | ICD-10-CM

## 2015-12-14 DIAGNOSIS — R1013 Epigastric pain: Secondary | ICD-10-CM | POA: Diagnosis not present

## 2015-12-14 LAB — CBC WITH DIFFERENTIAL/PLATELET
BASOS ABS: 0 {cells}/uL (ref 0–200)
Basophils Relative: 0 %
EOS PCT: 0 %
Eosinophils Absolute: 0 cells/uL — ABNORMAL LOW (ref 15–500)
HEMATOCRIT: 40.5 % (ref 35.0–45.0)
Hemoglobin: 13.7 g/dL (ref 11.7–15.5)
LYMPHS PCT: 13 %
Lymphs Abs: 1690 cells/uL (ref 850–3900)
MCH: 30.9 pg (ref 27.0–33.0)
MCHC: 33.8 g/dL (ref 32.0–36.0)
MCV: 91.2 fL (ref 80.0–100.0)
MPV: 10.4 fL (ref 7.5–12.5)
Monocytes Absolute: 260 cells/uL (ref 200–950)
Monocytes Relative: 2 %
NEUTROS PCT: 85 %
Neutro Abs: 11050 cells/uL — ABNORMAL HIGH (ref 1500–7800)
Platelets: 381 10*3/uL (ref 140–400)
RBC: 4.44 MIL/uL (ref 3.80–5.10)
RDW: 13.2 % (ref 11.0–15.0)
WBC: 13 10*3/uL — AB (ref 3.8–10.8)

## 2015-12-14 LAB — TSH: TSH: 0.46 m[IU]/L

## 2015-12-14 MED ORDER — BACLOFEN 10 MG PO TABS: 10.0000 mg | ORAL_TABLET | Freq: Three times a day (TID) | ORAL | Status: DC | PRN

## 2015-12-14 MED ORDER — CETIRIZINE HCL 10 MG PO TABS: 10.0000 mg | ORAL_TABLET | Freq: Every day | ORAL | Status: DC

## 2015-12-14 MED ORDER — HYDROCODONE-ACETAMINOPHEN 10-325 MG PO TABS: 1.0000 | ORAL_TABLET | Freq: Three times a day (TID) | ORAL | Status: DC | PRN

## 2015-12-14 MED ORDER — ALPRAZOLAM 2 MG PO TABS: ORAL_TABLET | ORAL | Status: DC

## 2015-12-14 MED ORDER — MONTELUKAST SODIUM 10 MG PO TABS: 10.0000 mg | ORAL_TABLET | Freq: Every day | ORAL | Status: DC

## 2015-12-14 MED ORDER — CARVEDILOL 6.25 MG PO TABS: 6.2500 mg | ORAL_TABLET | Freq: Two times a day (BID) | ORAL | Status: DC

## 2015-12-14 MED ORDER — CEFDINIR 300 MG PO CAPS: 300.0000 mg | ORAL_CAPSULE | Freq: Two times a day (BID) | ORAL | Status: DC

## 2015-12-14 MED FILL — PROMETHAZINE 25 MG TABLET: 25 | 10 days supply | Qty: 30 | Fill #0

## 2015-12-14 MED FILL — CEFDINIR 300 MG CAPSULE: 300 | 14 days supply | Qty: 28 | Fill #0

## 2015-12-14 MED FILL — FLUTICASONE PROP 50 MCG SPR: 50 | 30 days supply | Qty: 16 | Fill #0

## 2015-12-14 MED FILL — BACLOFEN 10 MG TABLET: 10 | 20 days supply | Qty: 60 | Fill #0

## 2015-12-14 MED FILL — MONTELUKAST SOD 10 MG TAB: 10 | 30 days supply | Qty: 30 | Fill #0

## 2015-12-14 MED FILL — ALL DAY ALLERGY 10 MG TAB: 10 | 100 days supply | Qty: 100 | Fill #0

## 2015-12-14 MED FILL — HYDROCODON-APAP 10-325: 10-325 | 21 days supply | Qty: 65 | Fill #0

## 2015-12-14 MED FILL — ALPRAZolam 2 MG TABS: 2 | 30 days supply | Qty: 60 | Fill #0

## 2015-12-14 NOTE — Telephone Encounter (Signed)
Faxed hardcopy for Alprazolam to [facility] pharmacy. 

## 2015-12-14 NOTE — Progress Notes (Signed)
Pre visit review using our clinic review tool, if applicable. No additional management support is needed unless otherwise documented below in the visit note. 

## 2015-12-14 NOTE — Telephone Encounter (Signed)
Requesting:  Alprazolam Contract   10/18/2014 UDS  Moderate Last OV  11/02/2015 Last Refill   #60 with 0 refills on 10/30/2015  Please Advise

## 2015-12-14 NOTE — Progress Notes (Signed)
Subjective:    Patient ID: Megan [name], female    DOB: 1951-01-28, 65 y.o.   MRN: [id number]  Chief Complaint  Patient presents with  . Follow-up    HPI Patient is in today for follow up has numerous concerns. Patient reports having some progressing acid reflux, had a ct scan due to recent concerns with esophageal problems, has been having some swollen lymph glands in neck that she reports is painful.    Past Medical History  Diagnosis Date  . GERD (gastroesophageal reflux disease)   . Depression   . Anxiety   . Insomnia   . IBS (irritable bowel syndrome)   . Diverticulosis   . Barrett esophagus   . Hiatal hernia   . Unspecified menopausal and postmenopausal disorder   . Vitamin B12 deficiency   . Blind loop syndrome   . Hepatic cyst   . Unspecified hypothyroidism   . Diarrhea   . Family history of malignant neoplasm of gastrointestinal tract     multiple members  . Endometrial hyperplasia 02/27/2006    BENIGN ENDO BX ON 02/2007  . C. difficile diarrhea   . Cystocele, midline   . Rectocele   . Uterine prolapse without mention of vaginal wall prolapse   . Chest pain, atypical 07/14/2011  . Leaky heart valve   . Hiatal hernia   . 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 Thrush 07/21/2011  . Rectal bleeding 10/10/2011  . Sinusitis acute 02/03/2012  . Contact dermatitis 03/23/2012  . Headache(784.0) 04/15/2012  . 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 Hematuria 04/27/2012  . Allergic state 12/31/2012  . 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 Paronychia of great toe, left 06/19/2013  . Low back pain 08/15/2013  . Osteoporosis 05/2011    T score-2.5 Lt femoral neck 2012, T score -2.4 2016 FRAX 12%/2.4%  . Adrenal gland cyst ([place]) 05/22/2014  . Hypertension   . Asthma   . 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 Preventative health care 09/11/2014  . Hyperlipidemia, mixed 12/17/2014  . Colon polyp   . Headache 04/16/2015    Past Surgical History  Procedure Laterality Date  . Appendectomy      . Uterine fibroid surgery    . Hysteroscopy      POLYP    Family History  Problem Relation Age of Onset  . Colon cancer Mother   . Diabetes Mother   . Hypertension Mother   . Colon cancer Mother   . Colon polyps Father   . Parkinson's disease Father   . Colon cancer Maternal Aunt   . Colon cancer Maternal Grandmother   . Breast cancer Maternal Grandmother 29  . Diabetes Paternal Grandmother   . Breast cancer Paternal Grandmother 41  . Colon cancer Cousin     X3  . Ovarian cancer Cousin   . Allergies Sister   . Arthritis Brother     b/l hip replace  . Alcohol abuse Daughter   . Arthritis Son   . Hypertension Son   . Allergies Son   . Osteoporosis Son   . Osteoporosis Son   . Arthritis Son     back disease, DDD  . Allergies Son   . Arthritis Son   . Irritable bowel syndrome Son     RSD  . Hypertension Paternal Grandfather   . Heart disease Paternal Grandfather   . Aneurysm Paternal Grandfather     Social History   Social History  . Marital Status: Married    Spouse Name: N/A  .  Number of Children: 4  . Years of Education: N/A   Occupational History  . Retired    Social History Main Topics  . Smoking status: Former Smoker    Quit date: 08/19/1995  . Smokeless tobacco: Never Used  . Alcohol Use: No  . Drug Use: No  . Sexual Activity: Not Currently    Birth Control/ Protection: Post-menopausal     Comment: 1st intercourse 57 yo-5 partners   Other Topics Concern  . Not on file   Social History Narrative    Outpatient Prescriptions Prior to Visit  Medication Sig Dispense Refill  . albuterol (PROVENTIL HFA;VENTOLIN HFA) 108 (90 Base) MCG/ACT inhaler Inhale 2 puffs into the lungs every 6 (six) hours as needed for wheezing or shortness of breath.    . alprazolam (XANAX) 2 MG tablet TAKE 1 TABLET BY MOUTH TWICE DAILY AS NEEDED FOR SLEEP 60 tablet 1  . amoxicillin (AMOXIL) 500 MG capsule Take 2 capsules (1,000 mg total) by mouth 2 (two) times daily. 40  capsule 0  . aspirin 81 MG tablet Take 81 mg by mouth daily.    . cyclobenzaprine (FLEXERIL) 10 MG tablet Take 1 tablet (10 mg total) by mouth 3 (three) times daily as needed for muscle spasms. 40 tablet 1  . dicyclomine (BENTYL) 10 MG capsule Take 1 capsule (10 mg total) by mouth 3 (three) times daily before meals. 30 capsule 0  . diltiazem (CARDIZEM CD) 180 MG 24 hr capsule TAKE ONE CAPSULE BY MOUTH DAILY 90 capsule 1  . fluticasone (FLONASE) 50 MCG/ACT nasal spray PLACE 2 SPRAYS INTO BOTH NOSTRILS DAILY 16 g 1  . lactobacillus acidophilus (BACID) TABS tablet Take 2 tablets by mouth 3 (three) times daily.    . meloxicam (MOBIC) 7.5 MG tablet Take 1 tablet (7.5 mg total) by mouth daily. 30 tablet 0  . omeprazole (PRILOSEC) 40 MG capsule Take 1 capsule by mouth 2 times daily before meals.  Take 30 min before breakfast and dinner. 60 capsule 6  . ondansetron (ZOFRAN-ODT) 4 MG disintegrating tablet Take 1 tablet (4 mg total) by mouth every 8 (eight) hours as needed for nausea or vomiting. 30 tablet 0  . promethazine (PHENERGAN) 25 MG tablet TAKE 1 TABLET BY MOUTH THREE TIMES DAILY AS NEEDED FOR NAUSEA 30 tablet 1  . ranitidine (ZANTAC) 300 MG tablet TAKE 1 TABLET BY MOUTH AT BEDTIME 90 tablet 1  . carvedilol (COREG) 6.25 MG tablet Take 1 tablet (6.25 mg total) by mouth 2 (two) times daily. 180 tablet 3  . HYDROcodone-acetaminophen (NORCO) 10-325 MG tablet Take 1 tablet by mouth every 8 (eight) hours as needed. 65 tablet 0  . alprazolam (XANAX) 2 MG tablet TAKE 1 TABLET BY MOUTH TWICE DAILY AS NEEDED FOR SLEEP 60 tablet 0   No facility-administered medications prior to visit.    Allergies  Allergen Reactions  . Sulfamethoxazole Shortness Of Breath    chest tightness  . Amitriptyline Anxiety and Other (See Comments)    Depression, insomnia  . Augmentin [Amoxicillin-Pot Clavulanate] Diarrhea  . Iohexol     unknown  . Iodinated Diagnostic Agents     Pt states at Dr.Grapeys office 15 years ago  blacked out for 2 hours during injection for IVP. Was told never to have IV dye again.     ROS     Objective:    Physical Exam  BP 132/70 mmHg  Pulse 86  Temp(Src) 98 F (36.7 C) (Oral)  Ht 5\' 6"  (1.676 m)  Wt 159 lb 8 oz (72.349 kg)  BMI 25.76 kg/m2  SpO2 98% Wt Readings from Last 3 Encounters:  12/14/15 159 lb 8 oz (72.349 kg)  11/14/15 158 lb (71.668 kg)  11/02/15 151 lb (68.493 kg)     Lab Results  Component Value Date   WBC 13.0* 12/14/2015   HGB 13.7 12/14/2015   HCT 40.5 12/14/2015   PLT 381 12/14/2015   GLUCOSE 83 09/06/2015   CHOL 215* 04/16/2015   TRIG 134.0 04/16/2015   HDL 45.70 04/16/2015   LDLDIRECT 165.0 06/30/2011   LDLCALC 142* 04/16/2015   ALT 12 09/06/2015   AST 14 09/06/2015   NA 139 09/06/2015   K 4.1 09/06/2015   CL 102 09/06/2015   CREATININE 0.67 09/06/2015   BUN 6 09/06/2015   CO2 28 09/06/2015   TSH 0.46 12/14/2015   HGBA1C 5.7 04/16/2015    Lab Results  Component Value Date   TSH 0.46 12/14/2015   Lab Results  Component Value Date   WBC 13.0* 12/14/2015   HGB 13.7 12/14/2015   HCT 40.5 12/14/2015   MCV 91.2 12/14/2015   PLT 381 12/14/2015   Lab Results  Component Value Date   NA 139 09/06/2015   K 4.1 09/06/2015   CO2 28 09/06/2015   GLUCOSE 83 09/06/2015   BUN 6 09/06/2015   CREATININE 0.67 09/06/2015   BILITOT 0.4 09/06/2015   ALKPHOS 100 09/06/2015   AST 14 09/06/2015   ALT 12 09/06/2015   PROT 7.6 09/06/2015   ALBUMIN 4.3 09/06/2015   CALCIUM 9.2 09/06/2015   ANIONGAP 9 05/28/2015   GFR 93.85 09/06/2015   Lab Results  Component Value Date   CHOL 215* 04/16/2015   Lab Results  Component Value Date   HDL 45.70 04/16/2015   Lab Results  Component Value Date   LDLCALC 142* 04/16/2015   Lab Results  Component Value Date   TRIG 134.0 04/16/2015   Lab Results  Component Value Date   CHOLHDL 5 04/16/2015   Lab Results  Component Value Date   HGBA1C 5.7 04/16/2015       Assessment & Plan:    Problem List Items Addressed This Visit    Preventative health care   Relevant Orders   TSH (Completed)   CBC with Differential/Platelet (Completed)   Pain in joint, ankle and foot   Relevant Medications   HYDROcodone-acetaminophen (NORCO) 10-325 MG tablet   Other Relevant Orders   TSH (Completed)   CBC with Differential/Platelet (Completed)   IBS (irritable bowel syndrome)    Avoid offending foods, start NOW probiotics daily add a fiber supplements. Continues to follow with [facility]      Relevant Medications   sucralfate (CARAFATE) 1 g tablet   Hyperlipidemia, mixed    Encouraged heart healthy diet, increase exercise, avoid trans fats, consider a krill oil cap daily      Relevant Medications   carvedilol (COREG) 6.25 MG tablet   Hyperglycemia    minimize simple carbs. Increase exercise as tolerated.      Essential hypertension - Primary    Well controlled, no changes to meds. Encouraged heart healthy diet such as the DASH diet and exercise as tolerated.       Relevant Medications   carvedilol (COREG) 6.25 MG tablet   Other Relevant Orders   TSH (Completed)   CBC with Differential/Platelet (Completed)   Allergic state    Singulair daily, Zyrtec daily       Other Visit Diagnoses  Palpitations        Relevant Medications    carvedilol (COREG) 6.25 MG tablet    Other Relevant Orders    TSH (Completed)    CBC with Differential/Platelet (Completed)       I am having Ms. Wehrenberg start on montelukast, baclofen, cetirizine, and cefdinir. I am also having her maintain her lactobacillus acidophilus, dicyclomine, aspirin, ondansetron, cyclobenzaprine, meloxicam, omeprazole, albuterol, diltiazem, ranitidine, amoxicillin, promethazine, fluticasone, alprazolam, sucralfate, carvedilol, and HYDROcodone-acetaminophen.  Meds ordered this encounter  Medications  . sucralfate (CARAFATE) 1 g tablet    Sig: Take 1 g by mouth 4 (four) times daily.  . carvedilol (COREG) 6.25 MG  tablet    Sig: Take 1 tablet (6.25 mg total) by mouth 2 (two) times daily.    Dispense:  180 tablet    Refill:  3  . HYDROcodone-acetaminophen (NORCO) 10-325 MG tablet    Sig: Take 1 tablet by mouth every 8 (eight) hours as needed.    Dispense:  65 tablet    Refill:  0  . montelukast (SINGULAIR) 10 MG tablet    Sig: Take 1 tablet (10 mg total) by mouth at bedtime.    Dispense:  30 tablet    Refill:  2  . baclofen (LIORESAL) 10 MG tablet    Sig: Take 1 tablet (10 mg total) by mouth 3 (three) times daily as needed for muscle spasms.    Dispense:  60 each    Refill:  1  . cetirizine (ZYRTEC) 10 MG tablet    Sig: Take 1 tablet (10 mg total) by mouth daily.    Dispense:  30 tablet    Refill:  3  . cefdinir (OMNICEF) 300 MG capsule    Sig: Take 1 capsule (300 mg total) by mouth 2 (two) times daily. x14    Dispense:  28 capsule    Refill:  0     Penni Homans, MD

## 2015-12-14 NOTE — Patient Instructions (Addendum)

## 2015-12-17 ENCOUNTER — Other Ambulatory Visit: Payer: Self-pay | Admitting: Family Medicine

## 2015-12-17 DIAGNOSIS — D72829 Elevated white blood cell count, unspecified: Secondary | ICD-10-CM

## 2015-12-30 NOTE — Assessment & Plan Note (Signed)
Well controlled, no changes to meds. Encouraged heart healthy diet such as the DASH diet and exercise as tolerated.  °

## 2015-12-30 NOTE — Assessment & Plan Note (Signed)
Singulair daily, Zyrtec daily

## 2015-12-30 NOTE — Assessment & Plan Note (Signed)
Encouraged heart healthy diet, increase exercise, avoid trans fats, consider a krill oil cap daily 

## 2015-12-30 NOTE — Assessment & Plan Note (Signed)
minimize simple carbs. Increase exercise as tolerated.  

## 2015-12-30 NOTE — Assessment & Plan Note (Addendum)
Avoid offending foods, start NOW probiotics daily add a fiber supplements. Continues to follow with [facility]

## 2015-12-31 ENCOUNTER — Other Ambulatory Visit: Payer: Self-pay | Admitting: Gynecology

## 2015-12-31 ENCOUNTER — Ambulatory Visit (HOSPITAL_BASED_OUTPATIENT_CLINIC_OR_DEPARTMENT_OTHER)
Admission: RE | Admit: 2015-12-31 | Discharge: 2015-12-31 | Disposition: A | Discharge status: Home / Self Care | Payer: Commercial Managed Care - HMO | Source: Ambulatory Visit | Attending: Gynecology | Admitting: Gynecology

## 2015-12-31 ENCOUNTER — Other Ambulatory Visit (INDEPENDENT_AMBULATORY_CARE_PROVIDER_SITE_OTHER): Payer: Commercial Managed Care - HMO

## 2015-12-31 DIAGNOSIS — Z1231 Encounter for screening mammogram for malignant neoplasm of breast: Secondary | ICD-10-CM | POA: Diagnosis not present

## 2015-12-31 DIAGNOSIS — D72829 Elevated white blood cell count, unspecified: Secondary | ICD-10-CM | POA: Diagnosis not present

## 2015-12-31 LAB — CBC WITH DIFFERENTIAL/PLATELET
BASOS PCT: 0.5 % (ref 0.0–3.0)
Basophils Absolute: 0 10*3/uL (ref 0.0–0.1)
EOS PCT: 2 % (ref 0.0–5.0)
Eosinophils Absolute: 0.1 10*3/uL (ref 0.0–0.7)
HEMATOCRIT: 40.1 % (ref 36.0–46.0)
HEMOGLOBIN: 13.4 g/dL (ref 12.0–15.0)
LYMPHS PCT: 31.6 % (ref 12.0–46.0)
Lymphs Abs: 1.9 10*3/uL (ref 0.7–4.0)
MCHC: 33.3 g/dL (ref 30.0–36.0)
MCV: 92.8 fl (ref 78.0–100.0)
MONOS PCT: 8.6 % (ref 3.0–12.0)
Monocytes Absolute: 0.5 10*3/uL (ref 0.1–1.0)
Neutro Abs: 3.5 10*3/uL (ref 1.4–7.7)
Neutrophils Relative %: 57.3 % (ref 43.0–77.0)
Platelets: 273 10*3/uL (ref 150.0–400.0)
RBC: 4.32 Mil/uL (ref 3.87–5.11)
RDW: 13.3 % (ref 11.5–15.5)
WBC: 6 10*3/uL (ref 4.0–10.5)

## 2016-01-04 ENCOUNTER — Encounter: Payer: Self-pay | Admitting: Family Medicine

## 2016-01-08 ENCOUNTER — Other Ambulatory Visit: Payer: Self-pay | Admitting: Family Medicine

## 2016-01-09 ENCOUNTER — Encounter: Payer: Self-pay | Admitting: Family Medicine

## 2016-01-10 ENCOUNTER — Other Ambulatory Visit: Payer: Self-pay | Admitting: Family Medicine

## 2016-01-10 ENCOUNTER — Telehealth: Payer: Self-pay | Admitting: Family Medicine

## 2016-01-10 DIAGNOSIS — M25579 Pain in unspecified ankle and joints of unspecified foot: Secondary | ICD-10-CM

## 2016-01-10 MED ORDER — SERTRALINE HCL 50 MG PO TABS: 50.0000 mg | ORAL_TABLET | Freq: Every day | ORAL | Status: DC

## 2016-01-10 MED ORDER — ESCITALOPRAM OXALATE 10 MG PO TABS: ORAL_TABLET | ORAL | Status: DC

## 2016-01-10 MED ORDER — ONDANSETRON 4 MG PO TBDP: 4.0000 mg | ORAL_TABLET | Freq: Three times a day (TID) | ORAL | Status: DC | PRN

## 2016-01-10 MED ORDER — SUCRALFATE 1 GM/10ML PO SUSP: 1.0000 g | Freq: Four times a day (QID) | ORAL | Status: DC | PRN

## 2016-01-10 MED ORDER — HYDROCODONE-ACETAMINOPHEN 10-325 MG PO TABS: 1.0000 | ORAL_TABLET | Freq: Three times a day (TID) | ORAL | Status: DC | PRN

## 2016-01-10 MED FILL — SUCRALFATE 1 GM TABLET: 1 | 30 days supply | Qty: 120 | Fill #0

## 2016-01-10 MED FILL — ONDANSETRON ODT 4 MG TABLET: 4 | 10 days supply | Qty: 30 | Fill #0

## 2016-01-10 MED FILL — FLUTICASONE PROP 50 MCG SPR: 50 | 30 days supply | Qty: 16 | Fill #1

## 2016-01-10 MED FILL — SERTRALINE HCL 50 MG TABLET: 50 | 34 days supply | Qty: 30 | Fill #0 | Status: TO

## 2016-01-10 NOTE — Telephone Encounter (Signed)
Pharmacy informed ok to change back to tablets.

## 2016-01-10 NOTE — Telephone Encounter (Signed)
OK to continue with Carafate tabs with same sig

## 2016-01-10 NOTE — Addendum Note (Signed)
Addended by: Sharon Seller B on: 01/10/2016 01:45 PM   Modules accepted: Orders, Medications

## 2016-01-10 NOTE — Telephone Encounter (Signed)
Pharmacy called to inform carafate suspension is too expensive and wants permission to fill the tablets again/patient aware.

## 2016-01-11 MED FILL — HYDROCODON-APAP 10-325: 10-325 | 21 days supply | Qty: 65 | Fill #0

## 2016-01-11 MED FILL — ALPRAZolam 2 MG TABS: 2 | 30 days supply | Qty: 60 | Fill #1

## 2016-01-14 ENCOUNTER — Emergency Department (HOSPITAL_COMMUNITY)
Admission: EM | Admit: 2016-01-14 | Discharge: 2016-01-14 | Disposition: A | Discharge status: Home / Self Care | Payer: Commercial Managed Care - HMO | Attending: Emergency Medicine | Admitting: Emergency Medicine

## 2016-01-14 ENCOUNTER — Emergency Department (HOSPITAL_COMMUNITY): Payer: Commercial Managed Care - HMO

## 2016-01-14 ENCOUNTER — Encounter (HOSPITAL_COMMUNITY): Payer: Self-pay | Admitting: Radiology

## 2016-01-14 DIAGNOSIS — Z872 Personal history of diseases of the skin and subcutaneous tissue: Secondary | ICD-10-CM | POA: Insufficient documentation

## 2016-01-14 DIAGNOSIS — G43909 Migraine, unspecified, not intractable, without status migrainosus: Secondary | ICD-10-CM | POA: Diagnosis not present

## 2016-01-14 DIAGNOSIS — R29810 Facial weakness: Secondary | ICD-10-CM | POA: Diagnosis not present

## 2016-01-14 DIAGNOSIS — G43109 Migraine with aura, not intractable, without status migrainosus: Secondary | ICD-10-CM | POA: Diagnosis not present

## 2016-01-14 DIAGNOSIS — K2270 Barrett's esophagus without dysplasia: Secondary | ICD-10-CM | POA: Insufficient documentation

## 2016-01-14 DIAGNOSIS — R200 Anesthesia of skin: Secondary | ICD-10-CM

## 2016-01-14 DIAGNOSIS — K219 Gastro-esophageal reflux disease without esophagitis: Secondary | ICD-10-CM | POA: Insufficient documentation

## 2016-01-14 DIAGNOSIS — Z5181 Encounter for therapeutic drug level monitoring: Secondary | ICD-10-CM | POA: Diagnosis not present

## 2016-01-14 DIAGNOSIS — I10 Essential (primary) hypertension: Secondary | ICD-10-CM | POA: Insufficient documentation

## 2016-01-14 DIAGNOSIS — H6091 Unspecified otitis externa, right ear: Secondary | ICD-10-CM

## 2016-01-14 DIAGNOSIS — Z79899 Other long term (current) drug therapy: Secondary | ICD-10-CM | POA: Diagnosis not present

## 2016-01-14 DIAGNOSIS — Z7982 Long term (current) use of aspirin: Secondary | ICD-10-CM | POA: Insufficient documentation

## 2016-01-14 DIAGNOSIS — Z8639 Personal history of other endocrine, nutritional and metabolic disease: Secondary | ICD-10-CM | POA: Diagnosis not present

## 2016-01-14 DIAGNOSIS — F419 Anxiety disorder, unspecified: Secondary | ICD-10-CM | POA: Insufficient documentation

## 2016-01-14 DIAGNOSIS — Z8742 Personal history of other diseases of the female genital tract: Secondary | ICD-10-CM | POA: Diagnosis not present

## 2016-01-14 DIAGNOSIS — F329 Major depressive disorder, single episode, unspecified: Secondary | ICD-10-CM | POA: Insufficient documentation

## 2016-01-14 DIAGNOSIS — Z8739 Personal history of other diseases of the musculoskeletal system and connective tissue: Secondary | ICD-10-CM | POA: Insufficient documentation

## 2016-01-14 DIAGNOSIS — G529 Cranial nerve disorder, unspecified: Secondary | ICD-10-CM | POA: Diagnosis not present

## 2016-01-14 DIAGNOSIS — Z87891 Personal history of nicotine dependence: Secondary | ICD-10-CM | POA: Insufficient documentation

## 2016-01-14 DIAGNOSIS — R29818 Other symptoms and signs involving the nervous system: Secondary | ICD-10-CM | POA: Diagnosis not present

## 2016-01-14 DIAGNOSIS — R51 Headache: Secondary | ICD-10-CM | POA: Diagnosis present

## 2016-01-14 DIAGNOSIS — Z86010 Personal history of colonic polyps: Secondary | ICD-10-CM | POA: Diagnosis not present

## 2016-01-14 DIAGNOSIS — Z8619 Personal history of other infectious and parasitic diseases: Secondary | ICD-10-CM | POA: Diagnosis not present

## 2016-01-14 DIAGNOSIS — J45909 Unspecified asthma, uncomplicated: Secondary | ICD-10-CM | POA: Insufficient documentation

## 2016-01-14 DIAGNOSIS — R202 Paresthesia of skin: Secondary | ICD-10-CM | POA: Diagnosis not present

## 2016-01-14 LAB — I-STAT TROPONIN, ED: TROPONIN I, POC: 0 ng/mL (ref 0.00–0.08)

## 2016-01-14 LAB — I-STAT CHEM 8, ED
BUN: 7 mg/dL (ref 6–20)
CALCIUM ION: 1.14 mmol/L (ref 1.13–1.30)
Chloride: 104 mmol/L (ref 101–111)
Creatinine, Ser: 0.6 mg/dL (ref 0.44–1.00)
Glucose, Bld: 93 mg/dL (ref 65–99)
HCT: 40 % (ref 36.0–46.0)
Hemoglobin: 13.6 g/dL (ref 12.0–15.0)
Potassium: 3.7 mmol/L (ref 3.5–5.1)
SODIUM: 142 mmol/L (ref 135–145)
TCO2: 24 mmol/L (ref 0–100)

## 2016-01-14 LAB — CBC
HCT: 39.5 % (ref 36.0–46.0)
HEMOGLOBIN: 12.9 g/dL (ref 12.0–15.0)
MCH: 30.4 pg (ref 26.0–34.0)
MCHC: 32.7 g/dL (ref 30.0–36.0)
MCV: 92.9 fL (ref 78.0–100.0)
Platelets: 313 10*3/uL (ref 150–400)
RBC: 4.25 MIL/uL (ref 3.87–5.11)
RDW: 12.5 % (ref 11.5–15.5)
WBC: 7.8 10*3/uL (ref 4.0–10.5)

## 2016-01-14 LAB — DIFFERENTIAL
Basophils Absolute: 0 10*3/uL (ref 0.0–0.1)
Basophils Relative: 1 %
EOS ABS: 0.2 10*3/uL (ref 0.0–0.7)
EOS PCT: 2 %
LYMPHS ABS: 3.8 10*3/uL (ref 0.7–4.0)
Lymphocytes Relative: 49 %
MONOS PCT: 11 %
Monocytes Absolute: 0.9 10*3/uL (ref 0.1–1.0)
NEUTROS PCT: 37 %
Neutro Abs: 2.9 10*3/uL (ref 1.7–7.7)

## 2016-01-14 LAB — COMPREHENSIVE METABOLIC PANEL
ALBUMIN: 3.8 g/dL (ref 3.5–5.0)
ALT: 16 U/L (ref 14–54)
ANION GAP: 6 (ref 5–15)
AST: 17 U/L (ref 15–41)
Alkaline Phosphatase: 89 U/L (ref 38–126)
BILIRUBIN TOTAL: 0.5 mg/dL (ref 0.3–1.2)
BUN: 7 mg/dL (ref 6–20)
CO2: 25 mmol/L (ref 22–32)
Calcium: 9.1 mg/dL (ref 8.9–10.3)
Chloride: 108 mmol/L (ref 101–111)
Creatinine, Ser: 0.67 mg/dL (ref 0.44–1.00)
Glucose, Bld: 96 mg/dL (ref 65–99)
POTASSIUM: 3.7 mmol/L (ref 3.5–5.1)
Sodium: 139 mmol/L (ref 135–145)
TOTAL PROTEIN: 6.9 g/dL (ref 6.5–8.1)

## 2016-01-14 LAB — PROTIME-INR
INR: 1.06 (ref 0.00–1.49)
Prothrombin Time: 14 seconds (ref 11.6–15.2)

## 2016-01-14 LAB — CBG MONITORING, ED: GLUCOSE-CAPILLARY: 94 mg/dL (ref 65–99)

## 2016-01-14 LAB — APTT: aPTT: 28 seconds (ref 24–37)

## 2016-01-14 MED ORDER — OFLOXACIN 0.3 % OT SOLN: 5.0000 [drp] | Freq: Two times a day (BID) | OTIC | Status: AC

## 2016-01-14 NOTE — Consult Note (Signed)
Neurology Consultation Reason for Consult: Droop Referring Physician: Billy Fischer, E  CC: Facial droop  History is obtained from: Patient  HPI: Megan [name] is a 65 y.o. female with a history of intestinal issues who presents with facial droop started sometime earlier today. She states that she has not been feeling very well all day, and went to lay down. Her husband last saw her 3 PM and when he returned home later that evening, noticed that she had facial droop. She states that she notices that she is numb in the right arm leg and face, most noticeable on the face. She also had some tingling earlier in her feet. A code stroke was called.  He is also complaining of a photophobic headache with some mild nausea as well.  LKW: 3 PM tpa given?: no, mild symptoms   ROS: A 14 point ROS was performed and is negative except as noted in the HPI.  Past Medical History  Diagnosis Date  . GERD (gastroesophageal reflux disease)   . Depression   . Anxiety   . Insomnia   . IBS (irritable bowel syndrome)   . Diverticulosis   . Barrett esophagus   . Hiatal hernia   . Unspecified menopausal and postmenopausal disorder   . Vitamin B12 deficiency   . Blind loop syndrome   . Hepatic cyst   . Unspecified hypothyroidism   . Diarrhea   . Family history of malignant neoplasm of gastrointestinal tract     multiple members  . Endometrial hyperplasia 02/27/2006    BENIGN ENDO BX ON 02/2007  . C. difficile diarrhea   . Cystocele, midline   . Rectocele   . Uterine prolapse without mention of vaginal wall prolapse   . Chest pain, atypical 07/14/2011  . Leaky heart valve   . Hiatal hernia   . 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 Thrush 07/21/2011  . Rectal bleeding 10/10/2011  . Sinusitis acute 02/03/2012  . Contact dermatitis 03/23/2012  . Headache(784.0) 04/15/2012  . 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 Hematuria 04/27/2012  . Allergic state 12/31/2012  . 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  Paronychia of great toe, left 06/19/2013  . Low back pain 08/15/2013  . Osteoporosis 05/2011    T score-2.5 Lt femoral neck 2012, T score -2.4 2016 FRAX 12%/2.4%  . 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 Hypertension   . Asthma   . 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 Preventative health care 09/11/2014  . Hyperlipidemia, mixed 12/17/2014  . Colon polyp   . Headache 04/16/2015     Family History  Problem Relation Age of Onset  . Colon cancer Mother   . Diabetes Mother   . Hypertension Mother   . Colon cancer Mother   . Colon polyps Father   . Parkinson's disease Father   . Colon cancer Maternal Aunt   . Colon cancer Maternal Grandmother   . Breast cancer Maternal Grandmother 40  . Diabetes Paternal Grandmother   . Breast cancer Paternal Grandmother 35  . Colon cancer Cousin     X3  . Ovarian cancer Cousin   . Allergies Sister   . Arthritis Brother     b/l hip replace  . Alcohol abuse Daughter   . Arthritis Son   . Hypertension Son   . Allergies Son   . Osteoporosis Son   . Osteoporosis Son   . Arthritis Son     back disease, DDD  . Allergies Son   . Arthritis Son   .  Irritable bowel syndrome Son     RSD  . Hypertension Paternal Grandfather   . Heart disease Paternal Grandfather   . Aneurysm Paternal Grandfather      Social History:  reports that she quit smoking about 20 years ago. She has never used smokeless tobacco. She reports that she does not drink alcohol or use illicit drugs.   Exam: Current vital signs: BP 131/69 mmHg  Pulse 69  Temp(Src) 98.2 F (36.8 C) (Oral)  Resp 13  SpO2 98% Vital signs in last 24 hours: Temp:  [98.2 F (36.8 C)] 98.2 F (36.8 C) (05/29 1849) Pulse Rate:  [65-70] 69 (05/29 1945) Resp:  [13-19] 13 (05/29 1945) BP: (131-151)/(65-71) 131/69 mmHg (05/29 1945) SpO2:  [97 %-98 %] 98 % (05/29 1945)   Physical Exam  Constitutional: Appears well-developed and well-nourished.  Psych: Affect appropriate to situation Eyes: No scleral  injection HENT: No OP obstrucion Head: Normocephalic.  Cardiovascular: Normal rate and regular rhythm.  Respiratory: Effort normal and breath sounds normal to anterior ascultation GI: Soft.  No distension. There is no tenderness.  Skin: WDI  Neuro: Mental Status: Patient is awake, alert, oriented to person, place, month, year, and situation. Patient is able to give a clear and coherent history. No signs of aphasia or neglect Cranial Nerves: II: Visual Fields are full. Pupils are equal, round, and reactive to light.   III,IV, VI: EOMI without ptosis or diploplia.  V: Facial sensation is Decreased on the right VII: Facial movement is noticeable for less movement on the left than right, but when she puffs out her cheeks her right puffs out and she holds her left side tight. VIII: hearing is intact to voice X: Uvula elevates symmetrically XI: Shoulder shrug is symmetric. XII: tongue is midline without atrophy or fasciculations.  Motor: Tone is normal. Bulk is normal. 5/5 strength was present in all four extremities.  Sensory: Sensation is decreased in the right arm face and leg Cerebellar: No ataxia    I have reviewed labs in epic and the results pertinent to this consultation are: CMP-unremarkable  I have reviewed the images obtained: CT head-unremarkable  Impression: 65 year old female with facial droop and numbness. The character of the symptoms is slightly unusual, I do wonder about complicated migraine. Certainly, however this would be a diagnosis of exclusion and she should have an MRI to rule out ischemic infarct. If this is negative, then I would favor treating as complicated migraine.  Recommendations: 1) MRI brain, if positive pursue stroke workup 2) if negative, would treat as complicated migraine   Roland Rack, MD [facility] [phone]  If 7pm- 7am, please page neurology on call as listed in [place].

## 2016-01-14 NOTE — ED Notes (Addendum)
Pt c/o R facial droop and slurred speech this afternoon. Husband states they were together at 1500 this afternoon and the pt was normal, then when he went back in the house he found her with facial droop and slurred speech. Speech is easy to understand but husband states it still sounds slurred to him. She has a slight r sided facial droop. She is alert, breathing easily, dneis pain

## 2016-01-14 NOTE — ED Notes (Signed)
Called [facility] to activate Code Stroke @ 1850

## 2016-01-16 NOTE — ED Provider Notes (Signed)
CSN: [id number]     Arrival date & time 01/14/16  1841 History   First MD Initiated Contact with Patient 01/14/16 1909     Chief Complaint  Patient presents with  . Code Stroke     (Consider location/radiation/quality/duration/timing/severity/associated sxs/prior Treatment) HPI   65yo female with history of ulcerative colitis, depression, anxiety, IBS, presents with concern for headache and right sided facial numbness, weakness, and numbness of right arm and leg. Code stroke activated in triage. Last known normal was 3PM.  Notes headache, frontal, aching, started slowly. Started to notice right side of face felt funny, "like novicaine". Husband thought right face was drooping, speech slightly slurred.   Past Medical History  Diagnosis Date  . GERD (gastroesophageal reflux disease)   . Depression   . Anxiety   . Insomnia   . IBS (irritable bowel syndrome)   . Diverticulosis   . Barrett esophagus   . Hiatal hernia   . Unspecified menopausal and postmenopausal disorder   . Vitamin B12 deficiency   . Blind loop syndrome   . Hepatic cyst   . Unspecified hypothyroidism   . Diarrhea   . Family history of malignant neoplasm of gastrointestinal tract     multiple members  . Endometrial hyperplasia 02/27/2006    BENIGN ENDO BX ON 02/2007  . C. difficile diarrhea   . Cystocele, midline   . Rectocele   . Uterine prolapse without mention of vaginal wall prolapse   . Chest pain, atypical 07/14/2011  . Leaky heart valve   . Hiatal hernia   . 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 Thrush 07/21/2011  . Rectal bleeding 10/10/2011  . Sinusitis acute 02/03/2012  . Contact dermatitis 03/23/2012  . Headache(784.0) 04/15/2012  . 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 Hematuria 04/27/2012  . Allergic state 12/31/2012  . 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 Paronychia of great toe, left 06/19/2013  . Low back pain 08/15/2013  . Osteoporosis 05/2011    T score-2.5 Lt femoral neck 2012, T score -2.4 2016 FRAX  12%/2.4%  . 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 Hypertension   . Asthma   . 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 Preventative health care 09/11/2014  . Hyperlipidemia, mixed 12/17/2014  . Colon polyp   . Headache 04/16/2015   Past Surgical History  Procedure Laterality Date  . Appendectomy    . Uterine fibroid surgery    . Hysteroscopy      POLYP   Family History  Problem Relation Age of Onset  . Colon cancer Mother   . Diabetes Mother   . Hypertension Mother   . Colon cancer Mother   . Colon polyps Father   . Parkinson's disease Father   . Colon cancer Maternal Aunt   . Colon cancer Maternal Grandmother   . Breast cancer Maternal Grandmother 63  . Diabetes Paternal Grandmother   . Breast cancer Paternal Grandmother 72  . Colon cancer Cousin     X3  . Ovarian cancer Cousin   . Allergies Sister   . Arthritis Brother     b/l hip replace  . Alcohol abuse Daughter   . Arthritis Son   . Hypertension Son   . Allergies Son   . Osteoporosis Son   . Osteoporosis Son   . Arthritis Son     back disease, DDD  . Allergies Son   . Arthritis Son   . Irritable bowel syndrome Son     RSD  . Hypertension Paternal Grandfather   .  Heart disease Paternal Grandfather   . Aneurysm Paternal Grandfather    Social History  Substance Use Topics  . Smoking status: Former Smoker    Quit date: 08/19/1995  . Smokeless tobacco: Never Used  . Alcohol Use: No   OB History    Gravida Para Term Preterm AB TAB SAB Ectopic Multiple Living   6 4   2     4      Review of Systems  Constitutional: Negative for fever.  HENT: Negative for sore throat.   Eyes: Negative for visual disturbance.  Respiratory: Negative for cough and shortness of breath.   Cardiovascular: Negative for chest pain.  Gastrointestinal: Negative for nausea, vomiting and abdominal pain.  Genitourinary: Negative for difficulty urinating.  Musculoskeletal: Negative for back pain and neck pain.  Skin: Negative for rash.   Neurological: Positive for facial asymmetry, speech difficulty (thnks maybe sounded slurred), weakness (husband noted right sided facial droop), numbness and headaches. Negative for syncope.      Allergies  Sulfamethoxazole; Amitriptyline; Augmentin; Iohexol; Lactose intolerance (gi); and Iodinated diagnostic agents  Home Medications   Prior to Admission medications   Medication Sig Start Date End Date Taking? Authorizing Provider  albuterol (PROVENTIL HFA;VENTOLIN HFA) 108 (90 Base) MCG/ACT inhaler Inhale 2 puffs into the lungs every 6 (six) hours as needed for wheezing or shortness of breath.   Yes Historical Provider, MD  alprazolam Duanne Moron) 2 MG tablet TAKE 1 TABLET BY MOUTH TWICE DAILY AS NEEDED FOR SLEEP 12/14/15  Yes [name], MD  Artificial Tear Ointment (DRY EYES OP) Place 1 drop into both eyes daily as needed (for dry eyes).   Yes Historical Provider, MD  aspirin 81 MG tablet Take 81 mg by mouth daily with supper.    Yes Historical Provider, MD  carvedilol (COREG) 6.25 MG tablet Take 1 tablet (6.25 mg total) by mouth 2 (two) times daily. 12/14/15  Yes [name], MD  cetirizine (ZYRTEC) 10 MG tablet Take 1 tablet (10 mg total) by mouth daily. Patient taking differently: Take 10 mg by mouth daily as needed for allergies.  12/14/15  Yes [name], MD  cyclobenzaprine (FLEXERIL) 10 MG tablet Take 1 tablet (10 mg total) by mouth 3 (three) times daily as needed for muscle spasms. 07/22/15  Yes [name], MD  diltiazem (CARDIZEM CD) 180 MG 24 hr capsule TAKE ONE CAPSULE BY MOUTH DAILY 10/26/15  Yes [name], MD  diphenhydramine-acetaminophen (TYLENOL PM) 25-500 MG TABS tablet Take 1 tablet by mouth at bedtime as needed (for sleep).   Yes Historical Provider, MD  fluticasone (FLONASE) 50 MCG/ACT nasal spray PLACE 2 SPRAYS INTO BOTH NOSTRILS DAILY Patient taking differently: PLACE 2 SPRAYS INTO BOTH NOSTRILS DAILY as needed for allergies 12/14/15  Yes [name], MD   HYDROcodone-acetaminophen (NORCO) 10-325 MG tablet Take 1 tablet by mouth every 8 (eight) hours as needed. Patient taking differently: Take 1 tablet by mouth every 8 (eight) hours as needed for moderate pain.  01/10/16  Yes [name], MD  lactobacillus acidophilus (BACID) TABS tablet Take 2 tablets by mouth 3 (three) times daily.   Yes Historical Provider, MD  montelukast (SINGULAIR) 10 MG tablet Take 1 tablet (10 mg total) by mouth at bedtime. Patient taking differently: Take 10 mg by mouth at bedtime as needed (for asthma symptoms).  12/14/15  Yes [name], MD  ondansetron (ZOFRAN-ODT) 4 MG disintegrating tablet Take 1 tablet (4 mg total) by mouth every  8 (eight) hours as needed for nausea or vomiting. 01/10/16  Yes [name], MD  promethazine (PHENERGAN) 25 MG tablet TAKE 1 TABLET BY MOUTH THREE TIMES DAILY AS NEEDED FOR NAUSEA 12/13/15  Yes [name], MD  ranitidine (ZANTAC) 300 MG tablet TAKE 1 TABLET BY MOUTH AT BEDTIME 10/26/15  Yes [name], MD  sertraline (ZOLOFT) 50 MG tablet Take 1 tablet (50 mg total) by mouth daily. Patient taking differently: Take 25 mg by mouth daily. 25 mg daily for the 1st week, then 50 mg daily 01/10/16  Yes [name], MD  sucralfate (CARAFATE) 1 g tablet Take 1 g by mouth 4 (four) times daily -  with meals and at bedtime. 01/10/16  Yes Historical Provider, MD  ofloxacin (FLOXIN) 0.3 % otic solution Place 5 drops into the right ear 2 (two) times daily. 01/14/16 02/18/16  Gareth Morgan, MD   BP 142/71 mmHg  Pulse 60  Temp(Src) 98.2 F (36.8 C) (Oral)  Resp 16  SpO2 99% Physical Exam  Constitutional: She is oriented to person, place, and time. She appears well-developed and well-nourished. No distress.  HENT:  Head: Normocephalic and atraumatic.  Eyes: Conjunctivae and EOM are normal.  Neck: Normal range of motion.  Cardiovascular: Normal rate, regular rhythm, normal heart sounds and intact distal pulses.  Exam reveals no gallop and  no friction rub.   No murmur heard. Pulmonary/Chest: Effort normal and breath sounds normal. No respiratory distress. She has no wheezes. She has no rales.  Abdominal: Soft. She exhibits no distension. There is no tenderness. There is no guarding.  Musculoskeletal: She exhibits no edema or tenderness.  Neurological: She is alert and oriented to person, place, and time. She has normal strength. A cranial nerve deficit (reports alterd sensation R side of face, no facial droop on my exam) and sensory deficit (right sided) is present. Coordination normal. GCS eye subscore is 4. GCS verbal subscore is 5. GCS motor subscore is 6.  Skin: Skin is warm and dry. No rash noted. She is not diaphoretic. No erythema.  Nursing note and vitals reviewed.   ED Course  Procedures (including critical care time) Labs Review Labs Reviewed  PROTIME-INR  APTT  CBC  DIFFERENTIAL  COMPREHENSIVE METABOLIC PANEL  I-STAT TROPOININ, ED  CBG MONITORING, ED  I-STAT CHEM 8, ED    Imaging Review Ct Head Wo Contrast  01/14/2016  CLINICAL DATA:  Code stroke.  Right facial droop EXAM: CT HEAD WITHOUT CONTRAST TECHNIQUE: Contiguous axial images were obtained from the base of the skull through the vertex without intravenous contrast. COMPARISON:  MRI head 05/10/2015. FINDINGS: Mild atrophy.  Negative for hydrocephalus. Mild chronic ischemic changes in the white matter and left internal capsule unchanged from the prior MRI. Negative for acute infarct. Negative for acute hemorrhage or mass lesion. No shift of the midline structures Negative calvarium. IMPRESSION: Atrophy and chronic microvascular ischemia.  No acute abnormality. These results were called by telephone at the time of interpretation on 01/14/2016 at 7:12 pm to Dr. Leonel Ramsay , who verbally acknowledged these results. Electronically Signed   By: Franchot Gallo M.D.   On: 01/14/2016 19:12   Mr Brain Wo Contrast  01/14/2016  CLINICAL DATA:  Initial valuation for  acute facial droop, right-sided numbness. EXAM: MRI HEAD WITHOUT CONTRAST TECHNIQUE: Multiplanar, multiecho pulse sequences of the brain and surrounding structures were obtained without intravenous contrast. COMPARISON:  Prior CT from earlier same day. FINDINGS: Mild age-related cerebral atrophy noted, stable.  Patchy T2/FLAIR white matter disease involving the periventricular and deep white matter as well as the subinsular regions is stable from previous, nonspecific. No abnormal foci of restricted diffusion to suggest acute infarct. Gray-white matter differentiation maintained. Major intracranial vascular flow voids are preserved. No acute or chronic intracranial hemorrhage. No areas of chronic infarction. No mass lesion, midline shift, or mass effect. No hydrocephalus. No extra-axial fluid collection. Major dural sinuses are grossly patent. Craniocervical junction normal. Visualized upper cervical spine unremarkable. Pituitary gland within normal limits. No acute abnormality about the globes and orbits. Small retention cyst noted within the left maxillary sinus. Paranasal sinuses are otherwise clear. No mastoid effusion. Inner ear structures normal. Bone marrow signal intensity within normal limits. No scalp soft tissue abnormality. IMPRESSION: 1. No acute intracranial infarct or other process identified. 2. Stable moderate cerebral white matter disease, nonspecific, but most commonly related to chronic small vessel ischemic disease. Electronically Signed   By: Jeannine Boga M.D.   On: 01/14/2016 22:23   I have personally reviewed and evaluated these images and lab results as part of my medical decision-making.   EKG Interpretation   Date/Time:  Monday Jan 14 2016 18:45:29 EDT Ventricular Rate:  70 PR Interval:  166 QRS Duration: 110 QT Interval:  402 QTC Calculation: 434 R Axis:   74 Text Interpretation:  Normal sinus rhythm Incomplete right bundle branch  block Borderline ECG Confirmed by  Carmin Muskrat  MD [phone]) on 01/15/2016  10:49:10 PM      MDM   Final diagnoses:  Complicated migraine  Numbness and tingling of right face  Otitis externa, right   65yo female with history of ulcerative colitis, depression, anxiety, IBS, presents with concern for headache and right sided facial numbness, weakness, and numbness of right arm and leg. Code stroke activated in triage.  Dr. Leonel Ramsay evaluated patient, recommends MR brain and if negative for acute stroke, suspect complicated migraine.  Headache started slowly, not consistent with SAH.  CT head negative. Labs WNL. MR without acute abnormalities.  Patient symptoms resolved while in the ED, including resolution of headache and numbness. Recommend close PCP follow up, discussed reasons to return in detail. Patient discharged in stable condition with understanding of reasons to return.    Gareth Morgan, MD 01/16/16 1248

## 2016-01-18 ENCOUNTER — Ambulatory Visit (INDEPENDENT_AMBULATORY_CARE_PROVIDER_SITE_OTHER): Payer: Commercial Managed Care - HMO | Admitting: Family Medicine

## 2016-01-18 ENCOUNTER — Encounter: Payer: Self-pay | Admitting: Family Medicine

## 2016-01-18 VITALS — BP 140/64 | HR 74 | Temp 98.6°F | Ht 66.0 in | Wt 159.5 lb

## 2016-01-18 DIAGNOSIS — E782 Mixed hyperlipidemia: Secondary | ICD-10-CM

## 2016-01-18 DIAGNOSIS — I10 Essential (primary) hypertension: Secondary | ICD-10-CM | POA: Diagnosis not present

## 2016-01-18 DIAGNOSIS — M810 Age-related osteoporosis without current pathological fracture: Secondary | ICD-10-CM | POA: Diagnosis not present

## 2016-01-18 DIAGNOSIS — R739 Hyperglycemia, unspecified: Secondary | ICD-10-CM

## 2016-01-18 DIAGNOSIS — J209 Acute bronchitis, unspecified: Secondary | ICD-10-CM

## 2016-01-18 DIAGNOSIS — K449 Diaphragmatic hernia without obstruction or gangrene: Secondary | ICD-10-CM

## 2016-01-18 DIAGNOSIS — K219 Gastro-esophageal reflux disease without esophagitis: Secondary | ICD-10-CM

## 2016-01-18 MED ORDER — AMOXICILLIN-POT CLAVULANATE 875-125 MG PO TABS: 1.0000 | ORAL_TABLET | Freq: Two times a day (BID) | ORAL | Status: DC

## 2016-01-18 MED ORDER — FEXOFENADINE HCL 180 MG PO TABS: 180.0000 mg | ORAL_TABLET | Freq: Two times a day (BID) | ORAL | Status: DC | PRN

## 2016-01-18 MED ORDER — TRAZODONE HCL 50 MG PO TABS: 25.0000 mg | ORAL_TABLET | Freq: Every evening | ORAL | Status: DC | PRN

## 2016-01-18 MED FILL — AMOX-CLAV 875-125 MG TABLET: 875-125 | 14 days supply | Qty: 28 | Fill #0

## 2016-01-18 MED FILL — traZODone HCL 50 MG TABS: 50 | 30 days supply | Qty: 30 | Fill #0

## 2016-01-18 NOTE — Progress Notes (Signed)
Pre visit review using our clinic review tool, if applicable. No additional management support is needed unless otherwise documented below in the visit note. 

## 2016-01-18 NOTE — Patient Instructions (Signed)

## 2016-01-22 ENCOUNTER — Ambulatory Visit: Payer: Commercial Managed Care - HMO | Admitting: Family Medicine

## 2016-01-23 MED FILL — PROMETHAZINE 25 MG TABLET: 25 | 10 days supply | Qty: 30 | Fill #1

## 2016-01-23 MED FILL — CARVEDILOL 6.25 MG TABLET: 6.25 | 90 days supply | Qty: 180 | Fill #1

## 2016-01-25 ENCOUNTER — Telehealth: Payer: Self-pay | Admitting: Family Medicine

## 2016-01-25 NOTE — Telephone Encounter (Signed)
Pt also wanted to notify Dr. Charlett Blake that she had a tick on her she pulled off on 01/09/16. She said it was on her bottom.

## 2016-01-25 NOTE — Telephone Encounter (Signed)
Pt called in because she says that she was seen by PCP. Pt says that she was prescribed a few antibiotics and is still feeling bad. Pt says that she has a lot of mucus. She would like to be advised further, what else could she do?     CB: [id number]

## 2016-01-25 NOTE — Telephone Encounter (Signed)
Patient informed of PCP instructions. 

## 2016-01-25 NOTE — Telephone Encounter (Signed)
If she develops rash, target lesion, fevers, headaches, malaise can check RMSF and lyme

## 2016-01-27 NOTE — Assessment & Plan Note (Signed)
Well controlled, no changes to meds. Encouraged heart healthy diet such as the DASH diet and exercise as tolerated.  °

## 2016-01-27 NOTE — Assessment & Plan Note (Signed)
hgba1c acceptable, minimize simple carbs. Increase exercise as tolerated.  

## 2016-01-27 NOTE — Assessment & Plan Note (Signed)
Encouraged heart healthy diet, increase exercise, avoid trans fats, consider a krill oil cap daily 

## 2016-01-27 NOTE — Assessment & Plan Note (Signed)
Augmentin, Mucinex and probiotics. Encouraged increased rest and hydration, add probiotics, zinc such as Coldeze or Xicam. Treat fevers as needed

## 2016-01-27 NOTE — Progress Notes (Signed)
Patient ID: Megan [name], female   DOB: Apr 22, 1951, 65 y.o.   MRN: [id number]   Subjective:    Patient ID: Megan [name], female    DOB: 08-04-1951, 65 y.o.   MRN: [id number]  Chief Complaint  Patient presents with  . Ear Pain    head congestion  . Sore Throat    HPI Patient is in today for evaluation of numerous concerns. Patient notes increased head congestion, ear and throat pain are also noted. She notes fatigue, PND and low grade fevers and cough. Cough is dry and pain is noted with coughing. Denies CP/palp/HA/GI or GU c/o. Taking meds as prescribed  Past Medical History  Diagnosis Date  . GERD (gastroesophageal reflux disease)   . Depression   . Anxiety   . Insomnia   . IBS (irritable bowel syndrome)   . Diverticulosis   . Barrett esophagus   . Hiatal hernia   . Unspecified menopausal and postmenopausal disorder   . Vitamin B12 deficiency   . Blind loop syndrome   . Hepatic cyst   . Unspecified hypothyroidism   . Diarrhea   . Family history of malignant neoplasm of gastrointestinal tract     multiple members  . Endometrial hyperplasia 02/27/2006    BENIGN ENDO BX ON 02/2007  . C. difficile diarrhea   . Cystocele, midline   . Rectocele   . Uterine prolapse without mention of vaginal wall prolapse   . Chest pain, atypical 07/14/2011  . Leaky heart valve   . Hiatal hernia   . 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 Thrush 07/21/2011  . Rectal bleeding 10/10/2011  . Sinusitis acute 02/03/2012  . Contact dermatitis 03/23/2012  . Headache(784.0) 04/15/2012  . 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 Hematuria 04/27/2012  . Allergic state 12/31/2012  . 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 Paronychia of great toe, left 06/19/2013  . Low back pain 08/15/2013  . Osteoporosis 05/2011    T score-2.5 Lt femoral neck 2012, T score -2.4 2016 FRAX 12%/2.4%  . 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 Hypertension   . Asthma   . 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 Preventative health care  09/11/2014  . Hyperlipidemia, mixed 12/17/2014  . Colon polyp   . Headache 04/16/2015    Past Surgical History  Procedure Laterality Date  . Appendectomy    . Uterine fibroid surgery    . Hysteroscopy      POLYP    Family History  Problem Relation Age of Onset  . Colon cancer Mother   . Diabetes Mother   . Hypertension Mother   . Colon cancer Mother   . Colon polyps Father   . Parkinson's disease Father   . Colon cancer Maternal Aunt   . Colon cancer Maternal Grandmother   . Breast cancer Maternal Grandmother 63  . Diabetes Paternal Grandmother   . Breast cancer Paternal Grandmother 48  . Colon cancer Cousin     X3  . Ovarian cancer Cousin   . Allergies Sister   . Arthritis Brother     b/l hip replace  . Alcohol abuse Daughter   . Arthritis Son   . Hypertension Son   . Allergies Son   . Osteoporosis Son   . Osteoporosis Son   . Arthritis Son     back disease, DDD  . Allergies Son   . Arthritis Son   . Irritable bowel syndrome Son     RSD  . Hypertension Paternal Grandfather   .  Heart disease Paternal Grandfather   . Aneurysm Paternal Grandfather     Social History   Social History  . Marital Status: Married    Spouse Name: N/A  . Number of Children: 4  . Years of Education: N/A   Occupational History  . Retired    Social History Main Topics  . Smoking status: Former Smoker    Quit date: 08/19/1995  . Smokeless tobacco: Never Used  . Alcohol Use: No  . Drug Use: No  . Sexual Activity: Not Currently    Birth Control/ Protection: Post-menopausal     Comment: 1st intercourse 38 yo-5 partners   Other Topics Concern  . Not on file   Social History Narrative    Outpatient Prescriptions Prior to Visit  Medication Sig Dispense Refill  . albuterol (PROVENTIL HFA;VENTOLIN HFA) 108 (90 Base) MCG/ACT inhaler Inhale 2 puffs into the lungs every 6 (six) hours as needed for wheezing or shortness of breath.    . alprazolam (XANAX) 2 MG tablet TAKE 1 TABLET  BY MOUTH TWICE DAILY AS NEEDED FOR SLEEP 60 tablet 1  . Artificial Tear Ointment (DRY EYES OP) Place 1 drop into both eyes daily as needed (for dry eyes).    Marland Kitchen aspirin 81 MG tablet Take 81 mg by mouth daily with supper.     . carvedilol (COREG) 6.25 MG tablet Take 1 tablet (6.25 mg total) by mouth 2 (two) times daily. 180 tablet 3  . cyclobenzaprine (FLEXERIL) 10 MG tablet Take 1 tablet (10 mg total) by mouth 3 (three) times daily as needed for muscle spasms. 40 tablet 1  . diltiazem (CARDIZEM CD) 180 MG 24 hr capsule TAKE ONE CAPSULE BY MOUTH DAILY 90 capsule 1  . diphenhydramine-acetaminophen (TYLENOL PM) 25-500 MG TABS tablet Take 1 tablet by mouth at bedtime as needed (for sleep).    . fluticasone (FLONASE) 50 MCG/ACT nasal spray PLACE 2 SPRAYS INTO BOTH NOSTRILS DAILY (Patient taking differently: PLACE 2 SPRAYS INTO BOTH NOSTRILS DAILY as needed for allergies) 16 g 1  . HYDROcodone-acetaminophen (NORCO) 10-325 MG tablet Take 1 tablet by mouth every 8 (eight) hours as needed. (Patient taking differently: Take 1 tablet by mouth every 8 (eight) hours as needed for moderate pain. ) 65 tablet 0  . lactobacillus acidophilus (BACID) TABS tablet Take 2 tablets by mouth 3 (three) times daily.    . montelukast (SINGULAIR) 10 MG tablet Take 1 tablet (10 mg total) by mouth at bedtime. (Patient taking differently: Take 10 mg by mouth at bedtime as needed (for asthma symptoms). ) 30 tablet 2  . ofloxacin (FLOXIN) 0.3 % otic solution Place 5 drops into the right ear 2 (two) times daily. 5 mL 0  . ondansetron (ZOFRAN-ODT) 4 MG disintegrating tablet Take 1 tablet (4 mg total) by mouth every 8 (eight) hours as needed for nausea or vomiting. 30 tablet 2  . promethazine (PHENERGAN) 25 MG tablet TAKE 1 TABLET BY MOUTH THREE TIMES DAILY AS NEEDED FOR NAUSEA 30 tablet 1  . ranitidine (ZANTAC) 300 MG tablet TAKE 1 TABLET BY MOUTH AT BEDTIME 90 tablet 1  . sertraline (ZOLOFT) 50 MG tablet Take 1 tablet (50 mg total) by  mouth daily. (Patient taking differently: Take 25 mg by mouth daily. 25 mg daily for the 1st week, then 50 mg daily) 30 tablet 1  . sucralfate (CARAFATE) 1 g tablet Take 1 g by mouth 4 (four) times daily -  with meals and at bedtime.  1  .  cetirizine (ZYRTEC) 10 MG tablet Take 1 tablet (10 mg total) by mouth daily. (Patient taking differently: Take 10 mg by mouth daily as needed for allergies. ) 30 tablet 3   No facility-administered medications prior to visit.    Allergies  Allergen Reactions  . Sulfamethoxazole Shortness Of Breath    chest tightness  . Amitriptyline Anxiety and Other (See Comments)    Depression, insomnia  . Iohexol Other (See Comments)    Passed out  . Lactose Intolerance (Gi) Diarrhea and Nausea And Vomiting  . Iodinated Diagnostic Agents     Pt states at Dr.Grapeys office 15 years ago blacked out for 2 hours during injection for IVP. Was told never to have IV dye again.     Review of Systems  Constitutional: Positive for fever and malaise/fatigue.  HENT: Positive for congestion.   Eyes: Negative for blurred vision.  Respiratory: Positive for cough, sputum production and shortness of breath.   Cardiovascular: Negative for chest pain, palpitations and leg swelling.  Gastrointestinal: Negative for nausea, abdominal pain and blood in stool.  Genitourinary: Negative for dysuria and frequency.  Musculoskeletal: Negative for falls.  Skin: Negative for rash.  Neurological: Negative for dizziness, loss of consciousness and headaches.  Endo/Heme/Allergies: Negative for environmental allergies.  Psychiatric/Behavioral: Negative for depression. The patient is not nervous/anxious.        Objective:    Physical Exam  Constitutional: She is oriented to person, place, and time. She appears well-developed and well-nourished. No distress.  HENT:  Head: Normocephalic and atraumatic.  Nose: Nose normal.  Nasal mucosa boggy and erythematous  Eyes: Right eye exhibits no  discharge. Left eye exhibits no discharge.  Neck: Normal range of motion. Neck supple.  Cardiovascular: Normal rate and regular rhythm.   No murmur heard. Pulmonary/Chest: Effort normal and breath sounds normal.  Abdominal: Soft. Bowel sounds are normal. There is no tenderness.  Musculoskeletal: She exhibits no edema.  Neurological: She is alert and oriented to person, place, and time.  Skin: Skin is warm and dry.  Psychiatric: She has a normal mood and affect.  Nursing note and vitals reviewed.   BP 140/64 mmHg  Pulse 74  Temp(Src) 98.6 F (37 C) (Oral)  Ht 5\' 6"  (1.676 m)  Wt 159 lb 8 oz (72.349 kg)  BMI 25.76 kg/m2  SpO2 97% Wt Readings from Last 3 Encounters:  01/18/16 159 lb 8 oz (72.349 kg)  12/14/15 159 lb 8 oz (72.349 kg)  11/14/15 158 lb (71.668 kg)     Lab Results  Component Value Date   WBC 7.8 01/14/2016   HGB 13.6 01/14/2016   HCT 40.0 01/14/2016   PLT 313 01/14/2016   GLUCOSE 93 01/14/2016   CHOL 215* 04/16/2015   TRIG 134.0 04/16/2015   HDL 45.70 04/16/2015   LDLDIRECT 165.0 06/30/2011   LDLCALC 142* 04/16/2015   ALT 16 01/14/2016   AST 17 01/14/2016   NA 142 01/14/2016   K 3.7 01/14/2016   CL 104 01/14/2016   CREATININE 0.60 01/14/2016   BUN 7 01/14/2016   CO2 25 01/14/2016   TSH 0.46 12/14/2015   INR 1.06 01/14/2016   HGBA1C 5.7 04/16/2015    Lab Results  Component Value Date   TSH 0.46 12/14/2015   Lab Results  Component Value Date   WBC 7.8 01/14/2016   HGB 13.6 01/14/2016   HCT 40.0 01/14/2016   MCV 92.9 01/14/2016   PLT 313 01/14/2016   Lab Results  Component Value Date  NA 142 01/14/2016   K 3.7 01/14/2016   CO2 25 01/14/2016   GLUCOSE 93 01/14/2016   BUN 7 01/14/2016   CREATININE 0.60 01/14/2016   BILITOT 0.5 01/14/2016   ALKPHOS 89 01/14/2016   AST 17 01/14/2016   ALT 16 01/14/2016   PROT 6.9 01/14/2016   ALBUMIN 3.8 01/14/2016   CALCIUM 9.1 01/14/2016   ANIONGAP 6 01/14/2016   GFR 93.85 09/06/2015   Lab  Results  Component Value Date   CHOL 215* 04/16/2015   Lab Results  Component Value Date   HDL 45.70 04/16/2015   Lab Results  Component Value Date   LDLCALC 142* 04/16/2015   Lab Results  Component Value Date   TRIG 134.0 04/16/2015   Lab Results  Component Value Date   CHOLHDL 5 04/16/2015   Lab Results  Component Value Date   HGBA1C 5.7 04/16/2015       Assessment & Plan:   Problem List Items Addressed This Visit    Osteoporosis    Encouraged to get adequate exercise, calcium and vitamin d intake      Hyperlipidemia, mixed    Encouraged heart healthy diet, increase exercise, avoid trans fats, consider a krill oil cap daily      Hyperglycemia    hgba1c acceptable, minimize simple carbs. Increase exercise as tolerated.       Gastroesophageal reflux disease with hiatal hernia    Avoid offending foods, start probiotics. Do not eat large meals in late evening and consider raising head of bed.       Essential hypertension - Primary    Well controlled, no changes to meds. Encouraged heart healthy diet such as the DASH diet and exercise as tolerated.       Acute bronchitis    Augmentin, Mucinex and probiotics. Encouraged increased rest and hydration, add probiotics, zinc such as Coldeze or Xicam. Treat fevers as needed         I have discontinued Ms. Mannings's cetirizine. I am also having her start on fexofenadine, amoxicillin-clavulanate, and traZODone. Additionally, I am having her maintain her lactobacillus acidophilus, aspirin, cyclobenzaprine, albuterol, diltiazem, ranitidine, promethazine, fluticasone, alprazolam, carvedilol, montelukast, ondansetron, sertraline, HYDROcodone-acetaminophen, diphenhydramine-acetaminophen, sucralfate, Artificial Tear Ointment (DRY EYES OP), and ofloxacin.  Meds ordered this encounter  Medications  . fexofenadine (ALLEGRA ALLERGY) 180 MG tablet    Sig: Take 1 tablet (180 mg total) by mouth 2 (two) times daily as needed for  allergies or rhinitis.  Marland Kitchen amoxicillin-clavulanate (AUGMENTIN) 875-125 MG tablet    Sig: Take 1 tablet by mouth 2 (two) times daily.    Dispense:  28 tablet    Refill:  0  . traZODone (DESYREL) 50 MG tablet    Sig: Take 0.5-1 tablets (25-50 mg total) by mouth at bedtime as needed for sleep.    Dispense:  30 tablet    Refill:  1     Penni Homans, MD

## 2016-01-27 NOTE — Assessment & Plan Note (Signed)
Avoid offending foods, start probiotics. Do not eat large meals in late evening and consider raising head of bed.  

## 2016-01-27 NOTE — Assessment & Plan Note (Signed)
Encouraged to get adequate exercise, calcium and vitamin d intake 

## 2016-01-28 ENCOUNTER — Encounter: Payer: Self-pay | Admitting: Family Medicine

## 2016-01-28 ENCOUNTER — Other Ambulatory Visit: Payer: Self-pay | Admitting: Family Medicine

## 2016-01-28 MED ORDER — DOXYCYCLINE HYCLATE 100 MG PO TABS: 100.0000 mg | ORAL_TABLET | Freq: Two times a day (BID) | ORAL | Status: DC

## 2016-01-28 NOTE — Telephone Encounter (Signed)
Patient informed of PCP instructions.she stated she has seen an ENT and will just wait until next week when PCP returns from being out of town as she wants to see PCP only.

## 2016-01-28 NOTE — Telephone Encounter (Signed)
Pt called in again stating she cannot get better. She wanted to see Dr. Charlett Blake if she came in but nothing open. She said that she still has thick mucus running down the back of her throat and she doesn't know what to do. Pt requesting call back at [phone].

## 2016-01-28 NOTE — Telephone Encounter (Signed)
Could refer to ent for further consideration or she can get looked at by some else for now

## 2016-01-29 MED FILL — DOXYCYCLINE 100 MG TABLET: 100 | 10 days supply | Qty: 20 | Fill #0

## 2016-01-29 MED FILL — CARTIA XT 180 MG CAPSULE SA: 180 | 90 days supply | Qty: 90 | Fill #0

## 2016-02-05 ENCOUNTER — Other Ambulatory Visit: Payer: Self-pay | Admitting: Family Medicine

## 2016-02-05 MED FILL — FLUTICASONE PROP 50 MCG SPR: 50 | 30 days supply | Qty: 16 | Fill #0 | Status: TO

## 2016-02-11 ENCOUNTER — Other Ambulatory Visit: Payer: Self-pay | Admitting: Family Medicine

## 2016-02-11 DIAGNOSIS — M25579 Pain in unspecified ankle and joints of unspecified foot: Secondary | ICD-10-CM

## 2016-02-11 MED FILL — ONDANSETRON ODT 4 MG TABLET: 4 | 10 days supply | Qty: 30 | Fill #1

## 2016-02-12 ENCOUNTER — Other Ambulatory Visit: Payer: Self-pay | Admitting: Family Medicine

## 2016-02-12 ENCOUNTER — Telehealth: Payer: Self-pay | Admitting: Family Medicine

## 2016-02-12 DIAGNOSIS — M25579 Pain in unspecified ankle and joints of unspecified foot: Secondary | ICD-10-CM

## 2016-02-12 MED ORDER — HYDROCODONE-ACETAMINOPHEN 10-325 MG PO TABS: 1.0000 | ORAL_TABLET | Freq: Three times a day (TID) | ORAL | Status: DC | PRN

## 2016-02-12 MED FILL — HYDROCODON-APAP 10-325: 10-325 | 22 days supply | Qty: 65 | Fill #0

## 2016-02-14 ENCOUNTER — Other Ambulatory Visit: Payer: Self-pay | Admitting: Family Medicine

## 2016-02-14 MED ORDER — HYOSCYAMINE SULFATE ER 0.375 MG PO TB12: 0.3750 mg | ORAL_TABLET | Freq: Three times a day (TID) | ORAL | Status: DC | PRN

## 2016-02-18 ENCOUNTER — Ambulatory Visit (INDEPENDENT_AMBULATORY_CARE_PROVIDER_SITE_OTHER): Payer: Commercial Managed Care - HMO | Admitting: Gynecology

## 2016-02-18 ENCOUNTER — Other Ambulatory Visit: Payer: Self-pay | Admitting: Family Medicine

## 2016-02-18 ENCOUNTER — Encounter: Payer: Self-pay | Admitting: Gynecology

## 2016-02-18 ENCOUNTER — Telehealth: Payer: Self-pay | Admitting: Family Medicine

## 2016-02-18 VITALS — BP 130/80 | Ht 66.0 in | Wt 159.0 lb

## 2016-02-18 DIAGNOSIS — N8189 Other female genital prolapse: Secondary | ICD-10-CM | POA: Diagnosis not present

## 2016-02-18 DIAGNOSIS — N952 Postmenopausal atrophic vaginitis: Secondary | ICD-10-CM

## 2016-02-18 DIAGNOSIS — J029 Acute pharyngitis, unspecified: Secondary | ICD-10-CM

## 2016-02-18 DIAGNOSIS — T7840XD Allergy, unspecified, subsequent encounter: Secondary | ICD-10-CM

## 2016-02-18 DIAGNOSIS — J329 Chronic sinusitis, unspecified: Secondary | ICD-10-CM

## 2016-02-18 DIAGNOSIS — M810 Age-related osteoporosis without current pathological fracture: Secondary | ICD-10-CM

## 2016-02-18 DIAGNOSIS — Z01419 Encounter for gynecological examination (general) (routine) without abnormal findings: Secondary | ICD-10-CM

## 2016-02-18 NOTE — Patient Instructions (Signed)

## 2016-02-18 NOTE — Telephone Encounter (Signed)
Pt called in because she says that she and the provider has previously discussed a referral to ENT if her throat isn't feeling better. Pt says that things aren't getting better. She says that it still hurt so she would like to have a referral.     CB: [phone]

## 2016-02-18 NOTE — Progress Notes (Signed)
    Megan [name] 12-11-1950 [id number]        64 y.o.  [id number]  for breast and pelvic exam. Several issues noted below.  Past medical history,surgical history, problem list, medications, allergies, family history and social history were all reviewed and documented as reviewed in the EPIC chart.  ROS:  Performed with pertinent positives and negatives included in the history, assessment and plan.   Additional significant findings :  Esophagitis   Exam: Megan [name] assistant Filed Vitals:   02/18/16 1058  BP: 130/80  Height: 5\' 6"  (1.676 m)  Weight: 159 lb (72.122 kg)   General appearance:  Normal affect, orientation and appearance. Skin: Grossly normal HEENT: Without gross lesions.  No cervical or supraclavicular adenopathy. Thyroid normal.  Lungs:  Clear without wheezing, rales or rhonchi Cardiac: RR, without RMG Abdominal:  Soft, nontender, without masses, guarding, rebound, organomegaly or hernia Breasts:  Examined lying and sitting without masses, retractions, discharge or axillary adenopathy. Pelvic:  Ext/BUS/vagina with atrophic changes. First to second degree cystocele. Mild uterine prolapse. First-degree rectocele.  Cervix atrophic  Uterus anteverted, normal size, shape and contour, midline and mobile nontender   Adnexa without masses or tenderness    Anus and perineum normal   Rectovaginal normal sphincter tone without palpated masses or tenderness. Confirms rectocele   Assessment/Plan:  66 y.o. [id number] female for breast and pelvic exam.  1. Postmenopausal/atrophic genital changes. Without significant hot flushes, night sweats, vaginal dryness or any vaginal bleeding. Continue monitor report any issues or vaginal bleeding. 2. Pelvic relaxation with cystocele/rectocele/uterine prolapse. Not having significant pressure symptoms or discomfort. I again reviewed with her the situation and issues. Options for management reviewed. As she is not having significant pressure  symptoms or urinary symptoms/bowel we both agreed to plan expectant management for now with follow up if any issues arise. Otherwise will reevaluate next year. 3. Osteoporosis.  DEXA 2012 T score -2.5. Follow up DEXA 2016 T score -2.4 FRAX 12%/2.4%. We previously discussed treatment options I again the day reviewed treatment options with her. The issue as to whether to treat now given her most current DEXA or based on her diagnosis of osteoporosis previously. Would not consider bisphosphonates given her significant esophagitis GI history. Would consider Prolia and I reviewed this with her. At this point we both decided not to proceed with treatment but continue observation. We will repeat DEXA at 2 year interval. Check vitamin D level today to make sure we are within the therapeutic range recognizing her GI issues. 4. Colonoscopy 2016. Repeat at their recommended interval. 5. Mammography 12/2015. Continue with annual mammography when due. SBE monthly reviewed. 6. Pap smear/HPV 01/2015.  No Pap smear done today. No history of significant abnormal Pap smears. 7. Health maintenance. Patient actively being evaluated for esophagitis now and is in the process of arranging follow up appointment with gastroenterology. No routine lab work done as this is done at her primary physician's office. Follow up in one year, sooner as needed.  Greater than 10 minutes of my time in excess of her breast and pelvic exam was spent in direct face to face counseling and coordination of care in regards to her problems of osteoporosis and pelvic relaxation.  Megan Auerbach MD, 11:48 AM 02/18/2016

## 2016-02-19 ENCOUNTER — Other Ambulatory Visit: Payer: Self-pay | Admitting: Family Medicine

## 2016-02-19 LAB — VITAMIN D 25 HYDROXY (VIT D DEFICIENCY, FRACTURES): VIT D 25 HYDROXY: 28 ng/mL — AB (ref 30–100)

## 2016-02-22 ENCOUNTER — Other Ambulatory Visit: Payer: Self-pay | Admitting: Gynecology

## 2016-02-22 DIAGNOSIS — E559 Vitamin D deficiency, unspecified: Secondary | ICD-10-CM

## 2016-02-26 DIAGNOSIS — R490 Dysphonia: Secondary | ICD-10-CM | POA: Diagnosis not present

## 2016-02-26 DIAGNOSIS — M26621 Arthralgia of right temporomandibular joint: Secondary | ICD-10-CM | POA: Diagnosis not present

## 2016-02-26 DIAGNOSIS — F3289 Other specified depressive episodes: Secondary | ICD-10-CM | POA: Diagnosis not present

## 2016-02-26 DIAGNOSIS — B370 Candidal stomatitis: Secondary | ICD-10-CM | POA: Diagnosis not present

## 2016-02-26 DIAGNOSIS — J310 Chronic rhinitis: Secondary | ICD-10-CM | POA: Diagnosis not present

## 2016-02-26 DIAGNOSIS — K219 Gastro-esophageal reflux disease without esophagitis: Secondary | ICD-10-CM | POA: Diagnosis not present

## 2016-02-27 MED FILL — SERTRALINE HCL 50 MG TABLET: 50 | 34 days supply | Qty: 30 | Fill #0

## 2016-02-29 ENCOUNTER — Other Ambulatory Visit: Payer: Self-pay | Admitting: Family Medicine

## 2016-02-29 ENCOUNTER — Other Ambulatory Visit: Payer: Self-pay | Admitting: Nurse Practitioner

## 2016-02-29 DIAGNOSIS — R1010 Upper abdominal pain, unspecified: Secondary | ICD-10-CM | POA: Diagnosis not present

## 2016-02-29 DIAGNOSIS — K219 Gastro-esophageal reflux disease without esophagitis: Secondary | ICD-10-CM | POA: Diagnosis not present

## 2016-02-29 DIAGNOSIS — R12 Heartburn: Secondary | ICD-10-CM | POA: Diagnosis not present

## 2016-02-29 DIAGNOSIS — R6881 Early satiety: Secondary | ICD-10-CM | POA: Diagnosis not present

## 2016-02-29 DIAGNOSIS — R140 Abdominal distension (gaseous): Secondary | ICD-10-CM | POA: Diagnosis not present

## 2016-02-29 DIAGNOSIS — R1013 Epigastric pain: Secondary | ICD-10-CM | POA: Diagnosis not present

## 2016-02-29 DIAGNOSIS — Z800 Family history of malignant neoplasm of digestive organs: Secondary | ICD-10-CM | POA: Diagnosis not present

## 2016-02-29 DIAGNOSIS — J029 Acute pharyngitis, unspecified: Secondary | ICD-10-CM | POA: Diagnosis not present

## 2016-02-29 DIAGNOSIS — R079 Chest pain, unspecified: Secondary | ICD-10-CM | POA: Diagnosis not present

## 2016-02-29 NOTE — Telephone Encounter (Signed)
Requesting: Alprazolam Contract   10/18/2014 UDS  moderate Last OV   01/18/2016 Last Refill  #60 with 1 refill on 12/14/2015  Please Advise

## 2016-03-03 ENCOUNTER — Other Ambulatory Visit: Payer: Self-pay | Admitting: Family Medicine

## 2016-03-03 ENCOUNTER — Telehealth: Payer: Self-pay | Admitting: Family Medicine

## 2016-03-03 NOTE — Telephone Encounter (Signed)
Pharmacy needs approval to fill alprazolam Duanne Moron) 2 MG tablet [id number] for this patient. States the pharmacy she took the Rx to does not have the medication so patient came back to [place]. Plse call Pharmacy downstairs.

## 2016-03-03 NOTE — Telephone Encounter (Signed)
Faxed hardcopy for Alprazolam to [facility]

## 2016-03-04 ENCOUNTER — Encounter: Payer: Self-pay | Admitting: Family Medicine

## 2016-03-04 NOTE — Telephone Encounter (Signed)
Do not fill xr---- fill reg xanax x1

## 2016-03-04 NOTE — Telephone Encounter (Signed)
Ben pharmacist from downstairs called to inform that the last alprazolam filled for this patient was alprazolam 2 mg #60 on 12/14/2015. Hardcopy sent in today is the XR and both are in her chart.  He states the patient should not be on the XR and was not what was filled last . Advise on change.  Suezanne Jacquet will take a verbal [phone]

## 2016-03-04 NOTE — Telephone Encounter (Signed)
Pt called in to schedule a lab appt. She says that she was advised by provider that a few labs are needed. Pt is unsure of which ones. Scheduled pt to come in on Friday. (when provider will be in the office)    Please assist with orders further.    Thanks.

## 2016-03-04 NOTE — Telephone Encounter (Signed)
Alprazolam was sent to the incorrect pharmacy. Called [facility] and canceled refill on alprazolam today 03/04/16 Faxed hardcopy into [facility] [employer].  Patient preference and did update pharmacy list and took out all pharmacies but the [facility] is now her only pharmacy.

## 2016-03-04 NOTE — Telephone Encounter (Signed)
See refill note dated 02/29/2016 regarding alprazolam refill (problem has been taken care of).

## 2016-03-04 NOTE — Telephone Encounter (Signed)
error 

## 2016-03-05 MED FILL — ALPRAZolam 2 MG TABS: 2 | 60 days supply | Qty: 60 | Fill #0

## 2016-03-05 NOTE — Telephone Encounter (Signed)
Called Suezanne Jacquet with [facility] with information regarding Xanax given by Dr. Etter Sjogren.

## 2016-03-07 ENCOUNTER — Telehealth: Payer: Self-pay | Admitting: Family Medicine

## 2016-03-07 ENCOUNTER — Other Ambulatory Visit: Payer: Self-pay | Admitting: Family Medicine

## 2016-03-07 ENCOUNTER — Other Ambulatory Visit: Payer: Commercial Managed Care - HMO

## 2016-03-07 DIAGNOSIS — E538 Deficiency of other specified B group vitamins: Secondary | ICD-10-CM

## 2016-03-07 NOTE — Telephone Encounter (Signed)
Patient came in the office today stating both her ENT and OBGYN MD requesting labs.  There were no specifics mentioned other than a full panel.  Patient was informed to find out from both providers what was needed and then patient would be contacted regarding PCP response to request.

## 2016-03-07 NOTE — Telephone Encounter (Signed)
°  Reason for call: Patient states Dr. Jodi Marble wanted her to have labs conducted. Contacted specialist office nurse Amy stated specialist instuctions were to follow up on depression and possible B12 definency.   Informed patient follow up scheduled for 03/18/2016 3pm. Patient doesn't want to wait until August to have B12 please advise if patient can come in sooner for B12. Please advise

## 2016-03-07 NOTE — Telephone Encounter (Signed)
OK to check Vitamin B12 for h/o vitamin B12 def

## 2016-03-07 NOTE — Telephone Encounter (Signed)
Put order in the computer, called the patient scheduled lab appt. For Monday 03/10/16

## 2016-03-10 ENCOUNTER — Telehealth: Payer: Self-pay | Admitting: Family Medicine

## 2016-03-10 ENCOUNTER — Other Ambulatory Visit: Payer: Commercial Managed Care - HMO

## 2016-03-10 NOTE — Telephone Encounter (Signed)
Pt called in to cancel her lab appt for today. Pt says that she has an appt scheduled with pcp for 8/1. She would like to just have labs completed on that day also to avoid making multiple  Trips.

## 2016-03-13 ENCOUNTER — Other Ambulatory Visit: Payer: Self-pay | Admitting: Family Medicine

## 2016-03-13 DIAGNOSIS — M25579 Pain in unspecified ankle and joints of unspecified foot: Secondary | ICD-10-CM

## 2016-03-15 NOTE — Telephone Encounter (Signed)
Pt request norco refill. I believe I got request on Thursday. Which is my long day. I try to handle rx refills in between patients, at lunch and late at night. I somehow missed her refill. Then later saw on Saturday morning from home. Even if I filled would need signature. So apologize. Will you show to Dr. Si Gaul on Monday.Or other provider if she is not in.

## 2016-03-16 MED ORDER — HYDROCODONE-ACETAMINOPHEN 10-325 MG PO TABS: 1.0000 | ORAL_TABLET | Freq: Three times a day (TID) | ORAL | 0 refills | Status: DC | PRN

## 2016-03-17 NOTE — Telephone Encounter (Signed)
Called left a detailed message hardcopy for Hydrocodone is ready for pickkup at the front desk.

## 2016-03-18 ENCOUNTER — Ambulatory Visit (INDEPENDENT_AMBULATORY_CARE_PROVIDER_SITE_OTHER): Payer: Commercial Managed Care - HMO | Admitting: Family Medicine

## 2016-03-18 ENCOUNTER — Encounter: Payer: Self-pay | Admitting: Family Medicine

## 2016-03-18 DIAGNOSIS — E782 Mixed hyperlipidemia: Secondary | ICD-10-CM | POA: Diagnosis not present

## 2016-03-18 DIAGNOSIS — M810 Age-related osteoporosis without current pathological fracture: Secondary | ICD-10-CM | POA: Diagnosis not present

## 2016-03-18 DIAGNOSIS — T7840XD Allergy, unspecified, subsequent encounter: Secondary | ICD-10-CM

## 2016-03-18 DIAGNOSIS — K449 Diaphragmatic hernia without obstruction or gangrene: Secondary | ICD-10-CM

## 2016-03-18 DIAGNOSIS — K219 Gastro-esophageal reflux disease without esophagitis: Secondary | ICD-10-CM | POA: Diagnosis not present

## 2016-03-18 DIAGNOSIS — I10 Essential (primary) hypertension: Secondary | ICD-10-CM

## 2016-03-18 MED ORDER — AZELASTINE HCL 0.1 % NA SOLN: 2.0000 | Freq: Two times a day (BID) | NASAL | 12 refills | Status: DC

## 2016-03-18 MED FILL — HYDROCODON-APAP 10-325: 10-325 | 22 days supply | Qty: 65 | Fill #0

## 2016-03-18 MED FILL — AZELASTINE 0.1% (137 MCG) S: 0.1 | 25 days supply | Qty: 30 | Fill #0

## 2016-03-18 NOTE — Progress Notes (Signed)
Pre visit review using our clinic review tool, if applicable. No additional management support is needed unless otherwise documented below in the visit note. 

## 2016-03-18 NOTE — Patient Instructions (Addendum)
Try cool compresses and apply Hyaluronic acid, can buy on [employer], [place] makes a good product    Barotitis Media Barotitis media is inflammation of your middle ear. This occurs when the auditory tube (eustachian tube) leading from the back of your nose (nasopharynx) to your eardrum is blocked. This blockage may result from a cold, environmental allergies, or an upper respiratory infection. Unresolved barotitis media may lead to damage or hearing loss (barotrauma), which may become permanent. HOME CARE INSTRUCTIONS   Use medicines as recommended by your health care provider. Over-the-counter medicines will help unblock the canal and can help during times of air travel.  Do not put anything into your ears to clean or unplug them. Eardrops will not be helpful.  Do not swim, dive, or fly until your health care provider says it is all right to do so. If these activities are necessary, chewing gum with frequent, forceful swallowing may help. It is also helpful to hold your nose and gently blow to pop your ears for equalizing pressure changes. This forces air into the eustachian tube.  Only take over-the-counter or prescription medicines for pain, discomfort, or fever as directed by your health care provider.  A decongestant may be helpful in decongesting the middle ear and make pressure equalization easier. SEEK MEDICAL CARE IF:  You experience a serious form of dizziness in which you feel as if the room is spinning and you feel nauseated (vertigo).  Your symptoms only involve one ear. SEEK IMMEDIATE MEDICAL CARE IF:   You develop a severe headache, dizziness, or severe ear pain.  You have bloody or pus-like drainage from your ears.  You develop a fever.  Your problems do not improve or become worse. MAKE SURE YOU:   Understand these instructions.  Will watch your condition.  Will get help right away if you are not doing well or get worse.   This information is not  intended to replace advice given to you by your health care provider. Make sure you discuss any questions you have with your health care provider.   Document Released: 08/01/2000 Document Revised: 05/25/2013 Document Reviewed: 03/01/2013 Elsevier Interactive Patient Education [employer].

## 2016-03-28 ENCOUNTER — Telehealth: Payer: Self-pay | Admitting: Family Medicine

## 2016-03-28 NOTE — Telephone Encounter (Signed)
Patient Name: Megan [name] DOB: 03-Dec-1950 Initial Comment caller states she has chest pains Nurse Assessment Nurse: Vallery Sa, RN, [facility] [place] Date/Time (Eastern Time): 03/28/2016 11:55:26 AM Confirm and document reason for call. If symptomatic, describe symptoms. You must click the next button to save text entered. ---Caller states she developed left chest pain when taking a deep breath in last night (rated as a 6 on the 1 to 10 scale). No injury in the past 3 days. No fever. No severe breathing difficulty. Alert and responsive. Has the patient traveled out of the country within the last 30 days? ---No Does the patient have any new or worsening symptoms? ---Yes Will a triage be completed? ---Yes Related visit to physician within the last 2 weeks? ---No Does the PT have any chronic conditions? (i.e. diabetes, asthma, etc.) ---Yes List chronic conditions. ---Colon and Stomach problems, Esophagitis, Heart Murmur and Leaky heart valve Is this a behavioral health or substance abuse call? ---No Guidelines Guideline Title Affirmed Question Affirmed Notes Chest Pain [1] Intermittent chest pain or "angina" AND [2] increasing in severity or frequency (Exception: pains lasting a few seconds) Final Disposition User Go to ED Now Vallery Sa, RN, [facility] - ED Disagree/Comply: Comply

## 2016-03-30 NOTE — Assessment & Plan Note (Signed)
Encouraged to get adequate exercise, calcium and vitamin d intake 

## 2016-03-30 NOTE — Progress Notes (Signed)
Patient ID: Megan [name], female   DOB: 1951/05/25, 65 y.o.   MRN: [id number]   Subjective:    Patient ID: Megan [name], female    DOB: [birth year], 65 y.o.   MRN: [id number]  Chief Complaint  Patient presents with  . Follow-up    HPI Patient is in today for follow up. She continues to struggle with congestion, heartburn and headaches. She struggles with fatigue and myalgias. Has been seen by ENT but no new thrapies were initiated. Denies CP/palp/SOB/fevers or GU c/o. Taking meds as prescribed  Past Medical History:  Diagnosis Date  . 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 Allergic state 12/31/2012  . Anxiety   . Asthma   . Barrett esophagus   . Blind loop syndrome   . C. difficile diarrhea   . Chest pain, atypical 07/14/2011  . Colon polyp   . Contact dermatitis 03/23/2012  . Cystocele, midline   . Depression   . Diarrhea   . Diverticulosis   . Endometrial hyperplasia 02/27/2006   BENIGN ENDO BX ON 02/2007  . Family history of malignant neoplasm of gastrointestinal tract    multiple members  . GERD (gastroesophageal reflux disease)   . Headache 04/16/2015  . Headache(784.0) 04/15/2012  . Hematuria 04/27/2012  . Hepatic cyst   . Hiatal hernia   . Hiatal hernia   . Hyperlipidemia, mixed 12/17/2014  . Hypertension   . IBS (irritable bowel syndrome)   . Insomnia   . Leaky heart valve   . Low back pain 08/15/2013  . Osteoporosis 05/2011   T score-2.5 Lt femoral neck 2012, T score -2.4 2016 FRAX 12%/2.4%  . 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 Paronychia of great toe, left 06/19/2013  . Preventative health care 09/11/2014  . Rectal bleeding 10/10/2011  . Rectocele   . 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 Sinusitis acute 02/03/2012  . Thrush 07/21/2011  . 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 Unspecified hypothyroidism   . Unspecified menopausal and postmenopausal disorder   . Uterine prolapse without mention of vaginal wall prolapse   .  Vitamin B12 deficiency     Past Surgical History:  Procedure Laterality Date  . APPENDECTOMY    . HYSTEROSCOPY     POLYP  . UTERINE FIBROID SURGERY      Family History  Problem Relation Age of Onset  . Colon cancer Mother   . Diabetes Mother   . Hypertension Mother   . Colon cancer Mother   . Colon polyps Father   . Parkinson's disease Father   . Colon cancer Maternal Aunt   . Colon cancer Maternal Grandmother   . Breast cancer Maternal Grandmother 58  . Diabetes Paternal Grandmother   . Breast cancer Paternal Grandmother 9  . Colon cancer Cousin     X3  . Ovarian cancer Cousin   . Allergies Sister   . Arthritis Brother     b/l hip replace  . Alcohol abuse Daughter   . Arthritis Son   . Hypertension Son   . Allergies Son   . Osteoporosis Son   . Osteoporosis Son   . Arthritis Son     back disease, DDD  . Allergies Son   . Arthritis Son   . Irritable bowel syndrome Son     RSD  . Hypertension Paternal Grandfather   . Heart disease Paternal Grandfather   . Aneurysm Paternal Grandfather     Social History   Social History  .  Marital status: Married    Spouse name: N/A  . Number of children: 4  . Years of education: N/A   Occupational History  . Retired    Social History Main Topics  . Smoking status: Former Smoker    Quit date: 08/19/1995  . Smokeless tobacco: Never Used  . Alcohol use No  . Drug use: No  . Sexual activity: Not Currently    Birth control/ protection: Post-menopausal     Comment: 1st intercourse 23 yo-5 partners   Other Topics Concern  . Not on file   Social History Narrative  . No narrative on file    Outpatient Medications Prior to Visit  Medication Sig Dispense Refill  . albuterol (VENTOLIN HFA) 108 (90 Base) MCG/ACT inhaler Inhale 2 puffs into the lungs every 6 (six) hours as needed for wheezing or shortness of breath. 18 g 5  . ALL DAY ALLERGY 10 MG tablet Take 1 tablet by mouth daily.  3  . ALPRAZolam (XANAX XR) 2 MG 24  hr tablet TAKE 1 TABLET BY MOUTH EVERY MORNING FOR STRESS 60 tablet 0  . alprazolam (XANAX) 2 MG tablet TAKE 1 TABLET BY MOUTH TWICE DAILY AS NEEDED FOR SLEEP 60 tablet 1  . Artificial Tear Ointment (DRY EYES OP) Place 1 drop into both eyes daily as needed (for dry eyes).    Marland Kitchen aspirin 81 MG tablet Take 81 mg by mouth daily with supper.     . carvedilol (COREG) 6.25 MG tablet Take 1 tablet (6.25 mg total) by mouth 2 (two) times daily. 180 tablet 3  . cyclobenzaprine (FLEXERIL) 10 MG tablet Take 1 tablet (10 mg total) by mouth 3 (three) times daily as needed for muscle spasms. 40 tablet 1  . diazepam (VALIUM) 10 MG tablet Take 10 mg by mouth daily.    Marland Kitchen diltiazem (CARDIZEM CD) 180 MG 24 hr capsule TAKE ONE CAPSULE BY MOUTH DAILY 90 capsule 1  . diphenhydramine-acetaminophen (TYLENOL PM) 25-500 MG TABS tablet Take 1 tablet by mouth at bedtime as needed (for sleep).    . fexofenadine (ALLEGRA ALLERGY) 180 MG tablet Take 1 tablet (180 mg total) by mouth 2 (two) times daily as needed for allergies or rhinitis.    . fluticasone (FLONASE) 50 MCG/ACT nasal spray PLACE 2 SPRAYS INTO BOTH NOSTRILS DAILY 16 g 6  . HYDROcodone-acetaminophen (NORCO) 10-325 MG tablet Take 1 tablet by mouth every 8 (eight) hours as needed. 65 tablet 0  . hyoscyamine (LEVBID) 0.375 MG 12 hr tablet Take 1 tablet (0.375 mg total) by mouth 3 (three) times daily as needed. 90 tablet 1  . lactobacillus acidophilus (BACID) TABS tablet Take 2 tablets by mouth 3 (three) times daily.    . montelukast (SINGULAIR) 10 MG tablet Take 1 tablet (10 mg total) by mouth at bedtime. (Patient taking differently: Take 10 mg by mouth at bedtime as needed (for asthma symptoms). ) 30 tablet 2  . ranitidine (ZANTAC) 300 MG tablet TAKE 1 TABLET BY MOUTH AT BEDTIME 90 tablet 1  . sertraline (ZOLOFT) 50 MG tablet Take 1 tablet (50 mg total) by mouth daily. (Patient taking differently: Take 25 mg by mouth daily. 25 mg daily for the 1st week, then 50 mg daily) 30  tablet 1  . sucralfate (CARAFATE) 1 g tablet Take 1 g by mouth 4 (four) times daily -  with meals and at bedtime.  1  . traZODone (DESYREL) 50 MG tablet Take 0.5-1 tablets (25-50 mg total) by mouth at  bedtime as needed for sleep. 30 tablet 1  . hyoscyamine (LEVBID) 0.375 MG 12 hr tablet TAKE 2 TABLETS BY MOUTH TWICE DAILY AS NEEDED 360 tablet 0   No facility-administered medications prior to visit.     Allergies  Allergen Reactions  . Sulfamethoxazole Shortness Of Breath    chest tightness  . Amitriptyline Anxiety and Other (See Comments)    Depression, insomnia  . Iohexol Other (See Comments)    Passed out  . Lactose Intolerance (Gi) Diarrhea and Nausea And Vomiting  . Iodinated Diagnostic Agents     Pt states at Dr.Grapeys office 15 years ago blacked out for 2 hours during injection for IVP. Was told never to have IV dye again.     Review of Systems  Constitutional: Positive for malaise/fatigue. Negative for fever.  HENT: Positive for congestion.   Eyes: Negative for blurred vision.  Respiratory: Negative for shortness of breath.   Cardiovascular: Negative for chest pain, palpitations and leg swelling.  Gastrointestinal: Positive for heartburn. Negative for abdominal pain, blood in stool and nausea.  Genitourinary: Negative for dysuria and frequency.  Musculoskeletal: Positive for myalgias. Negative for falls.  Skin: Negative for rash.  Neurological: Positive for headaches. Negative for dizziness and loss of consciousness.  Endo/Heme/Allergies: Negative for environmental allergies.  Psychiatric/Behavioral: Negative for depression. The patient is not nervous/anxious.        Objective:    Physical Exam  Constitutional: She is oriented to person, place, and time. She appears well-developed and well-nourished. No distress.  HENT:  Head: Normocephalic and atraumatic.  Nose: Nose normal.  Eyes: Right eye exhibits no discharge. Left eye exhibits no discharge.  Neck: Normal  range of motion. Neck supple.  Cardiovascular: Normal rate and regular rhythm.   No murmur heard. Pulmonary/Chest: Effort normal and breath sounds normal.  Abdominal: Soft. Bowel sounds are normal. There is no tenderness.  Musculoskeletal: She exhibits no edema.  Neurological: She is alert and oriented to person, place, and time.  Skin: Skin is warm and dry.  Psychiatric: She has a normal mood and affect.  Nursing note and vitals reviewed.   BP 130/82 (BP Location: Left Arm, Patient Position: Sitting, Cuff Size: Normal)   Pulse 66   Temp 98.3 F (36.8 C) (Oral)   Ht 5\' 6"  (1.676 m)   Wt 159 lb 8 oz (72.3 kg)   SpO2 96%   BMI 25.74 kg/m  Wt Readings from Last 3 Encounters:  03/18/16 159 lb 8 oz (72.3 kg)  02/18/16 159 lb (72.1 kg)  01/18/16 159 lb 8 oz (72.3 kg)     Lab Results  Component Value Date   WBC 7.8 01/14/2016   HGB 13.6 01/14/2016   HCT 40.0 01/14/2016   PLT 313 01/14/2016   GLUCOSE 93 01/14/2016   CHOL 215 (H) 04/16/2015   TRIG 134.0 04/16/2015   HDL 45.70 04/16/2015   LDLDIRECT 165.0 06/30/2011   LDLCALC 142 (H) 04/16/2015   ALT 16 01/14/2016   AST 17 01/14/2016   NA 142 01/14/2016   K 3.7 01/14/2016   CL 104 01/14/2016   CREATININE 0.60 01/14/2016   BUN 7 01/14/2016   CO2 25 01/14/2016   TSH 0.46 12/14/2015   INR 1.06 01/14/2016   HGBA1C 5.7 04/16/2015    Lab Results  Component Value Date   TSH 0.46 12/14/2015   Lab Results  Component Value Date   WBC 7.8 01/14/2016   HGB 13.6 01/14/2016   HCT 40.0 01/14/2016   MCV  92.9 01/14/2016   PLT 313 01/14/2016   Lab Results  Component Value Date   NA 142 01/14/2016   K 3.7 01/14/2016   CO2 25 01/14/2016   GLUCOSE 93 01/14/2016   BUN 7 01/14/2016   CREATININE 0.60 01/14/2016   BILITOT 0.5 01/14/2016   ALKPHOS 89 01/14/2016   AST 17 01/14/2016   ALT 16 01/14/2016   PROT 6.9 01/14/2016   ALBUMIN 3.8 01/14/2016   CALCIUM 9.1 01/14/2016   ANIONGAP 6 01/14/2016   GFR 93.85 09/06/2015    Lab Results  Component Value Date   CHOL 215 (H) 04/16/2015   Lab Results  Component Value Date   HDL 45.70 04/16/2015   Lab Results  Component Value Date   LDLCALC 142 (H) 04/16/2015   Lab Results  Component Value Date   TRIG 134.0 04/16/2015   Lab Results  Component Value Date   CHOLHDL 5 04/16/2015   Lab Results  Component Value Date   HGBA1C 5.7 04/16/2015       Assessment & Plan:   Problem List Items Addressed This Visit    Essential hypertension    Well controlled, no changes to meds. Encouraged heart healthy diet such as the DASH diet and exercise as tolerated.       Gastroesophageal reflux disease with hiatal hernia    Avoid offending foods, start probiotics. Do not eat large meals in late evening and consider raising head of bed.       Osteoporosis    Encouraged to get adequate exercise, calcium and vitamin d intake      Allergic state    Has seen ENT no new medications prescribed      Hyperlipidemia, mixed    Encouraged heart healthy diet, increase exercise, avoid trans fats, consider a krill oil cap daily       Other Visit Diagnoses   None.     I am having Ms. Soth start on azelastine. I am also having her maintain her lactobacillus acidophilus, aspirin, cyclobenzaprine, diltiazem, ranitidine, alprazolam, carvedilol, montelukast, sertraline, diphenhydramine-acetaminophen, sucralfate, Artificial Tear Ointment (DRY EYES OP), fexofenadine, traZODone, fluticasone, hyoscyamine, ALL DAY ALLERGY, diazepam, albuterol, ALPRAZolam, and HYDROcodone-acetaminophen.  Meds ordered this encounter  Medications  . azelastine (ASTELIN) 0.1 % nasal spray    Sig: Place 2 sprays into both nostrils 2 (two) times daily. Use in each nostril as directed    Dispense:  30 mL    Refill:  12     Penni Homans, MD

## 2016-03-30 NOTE — Assessment & Plan Note (Signed)
Encouraged heart healthy diet, increase exercise, avoid trans fats, consider a krill oil cap daily 

## 2016-03-30 NOTE — Assessment & Plan Note (Signed)
Well controlled, no changes to meds. Encouraged heart healthy diet such as the DASH diet and exercise as tolerated.  °

## 2016-03-30 NOTE — Assessment & Plan Note (Signed)
Has seen ENT no new medications prescribed

## 2016-03-30 NOTE — Assessment & Plan Note (Signed)
Avoid offending foods, start probiotics. Do not eat large meals in late evening and consider raising head of bed.  

## 2016-03-31 NOTE — Telephone Encounter (Signed)
Followed-up with the patient regarding her symptom of left chest pain. Patient voiced that she did not go to the ED, instead she just waited it out for a while and went to bed. She stated, "It got better and just took it easy all weekend". Currently, the patient is not reporting any symptoms. Advised to give the office a call if she develops worsening symptoms. Patient verbalized understanding and did not address any further concerns prior to call ending.

## 2016-04-03 IMAGING — MR MR MRA HEAD W/O CM
11 of 14 series · 30 of 48 positions shown · IV contrast (15 ml multihance)
Comparison: Brain MRI 07/28/2012.

CLINICAL DATA: Predominantly left-sided, new onset headaches with
memory loss beginning 2-3 months ago. Family history of aneurysms.

EXAM:
MRI HEAD WITHOUT AND WITH CONTRAST
MRA HEAD WITHOUT CONTRAST
TECHNIQUE: Multiplanar, multiecho pulse sequences of the brain and surrounding
structures were obtained without and with intravenous contrast.
Angiographic images of the head were obtained using MRA technique
without contrast.
CONTRAST:  15mL MULTIHANCE GADOBENATE DIMEGLUMINE 529 MG/ML IV SOLN

[Series 3: T1 · sagittal · 5.0mm · 0.45mm/px · 2 of 19 slices shown]
[im 1/19]
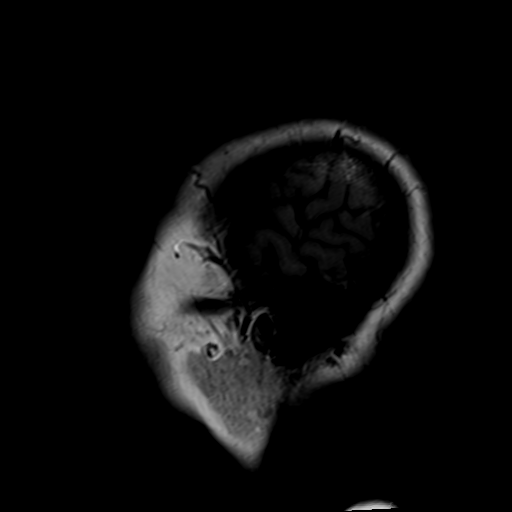
[im 19/19]
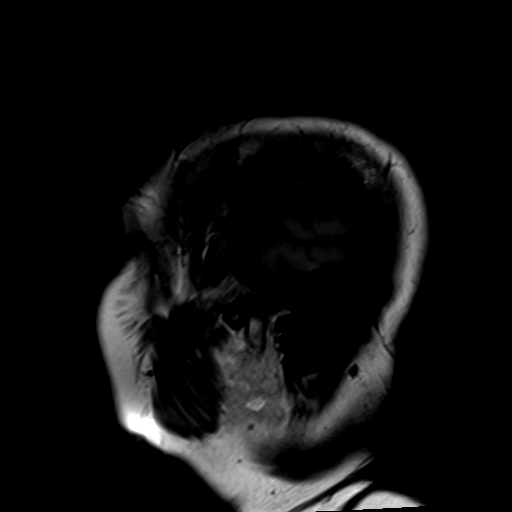

[Series 4: ep2d_diff_(id)_trace · axial · 3.0mm · 1.80mm/px · z∈[+4,+149]mm · 6 of 98 slices shown]
[im 1/98]
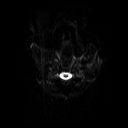
[im 20/98]
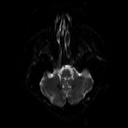
[im 39/98]
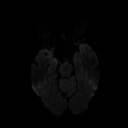
[im 59/98]
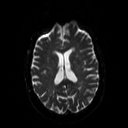
[im 78/98]
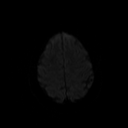
[im 98/98]
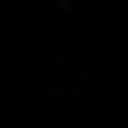

[Series 5: ep2d_diff_(id)_trace_adc · axial · 3.0mm · 1.80mm/px · z∈[+4,+149]mm · 3 of 50 slices shown]
[im 1/50]
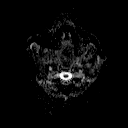
[im 25/50]
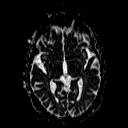
[im 50/50]
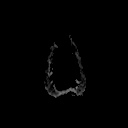

[Series 6: ep2d_diff cor for · coronal · 5.0mm · 0.90mm/px · 4 of 65 slices shown (1 of 2)]
[im 1/65]
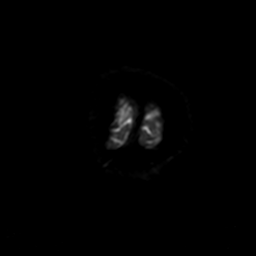
[im 22/65]
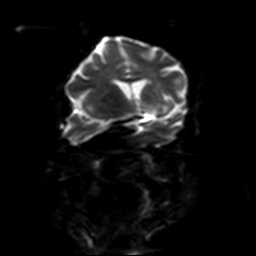
[im 43/65]
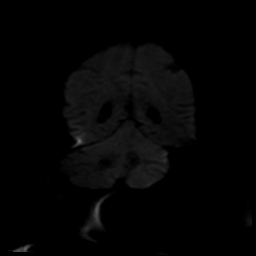
[im 65/65]
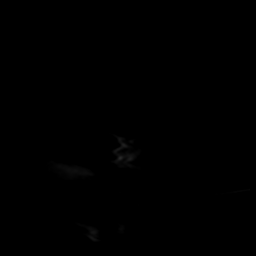

[Series 7: ep2d_diff cor for · coronal · 5.0mm · 0.90mm/px · 2 of 34 slices shown (2 of 2)]
[im 1/34]
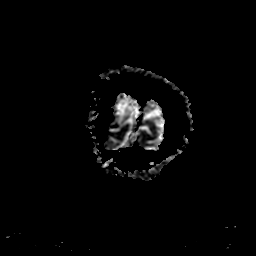
[im 34/34]
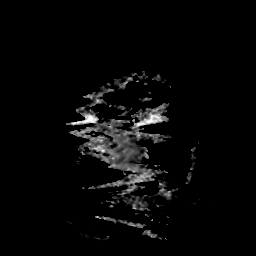

[Series 9: tof_3d_multi-slab new · axial · 0.7mm · 0.37mm/px · z∈[+10,+84]mm · 6 of 144 slices shown]
[im 1/144]
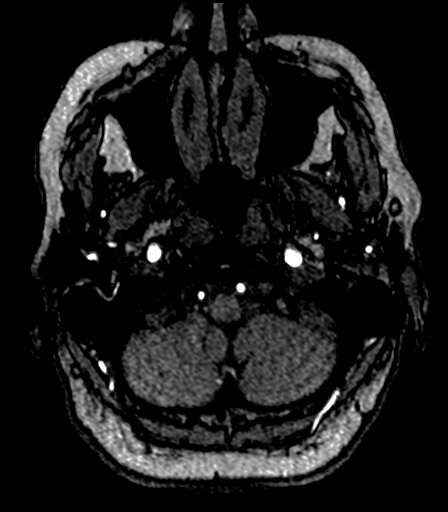
[im 18/144]
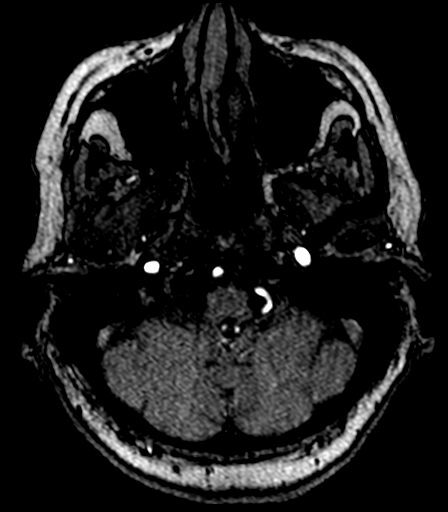
[im 36/144]
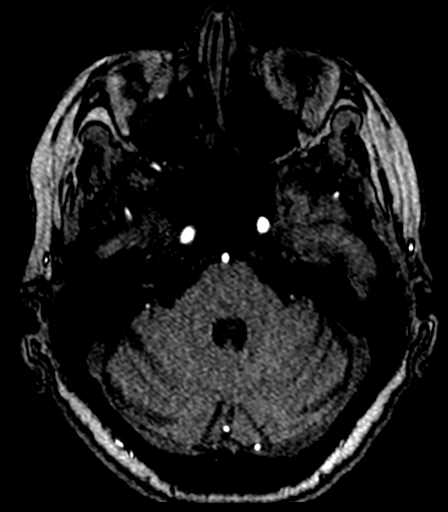
[im 54/144]
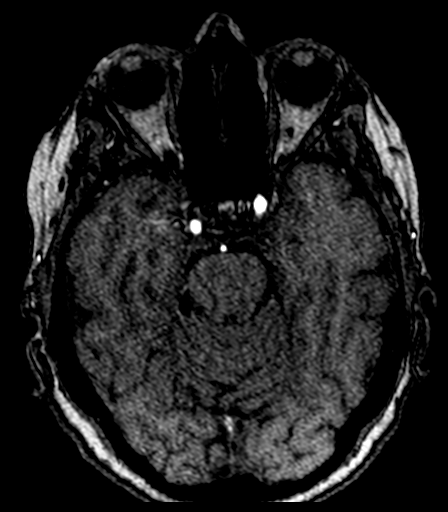
[im 90/144]
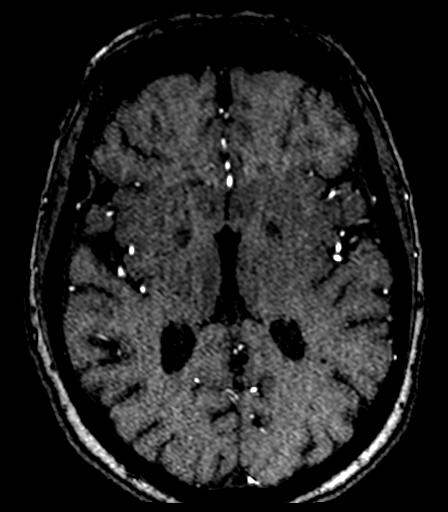
[im 108/144]
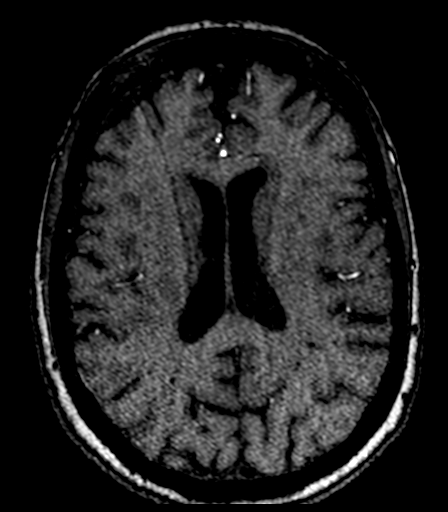

[Series 13: T2 · axial · 5.0mm · 0.30mm/px · 1 of 22 slices shown (1 of 2)]
[im 1/22]
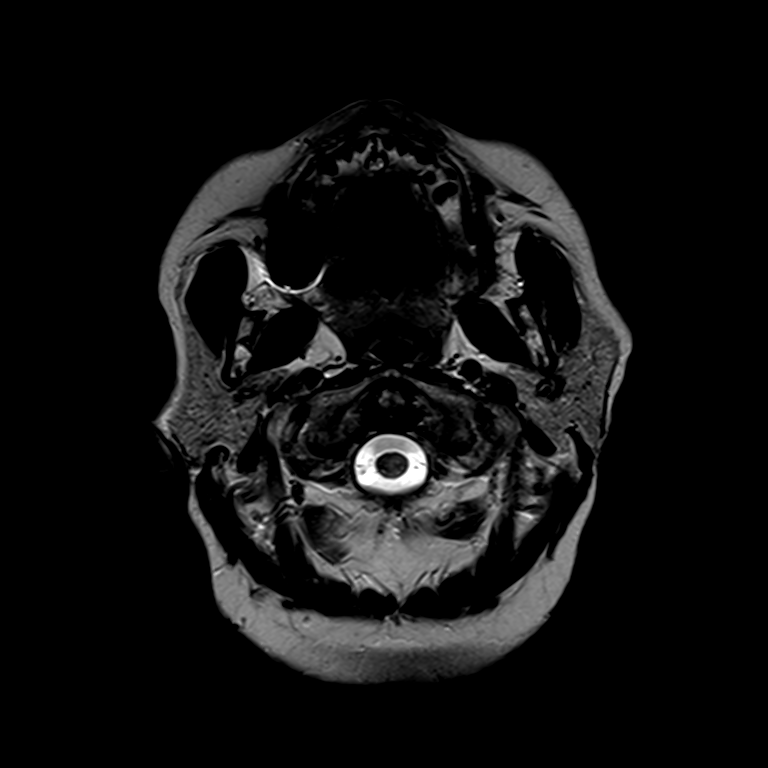

[Series 16: FLAIR · axial · 5.0mm · 0.45mm/px · 1 of 22 slices shown (1 of 2)]
[im 1/22]
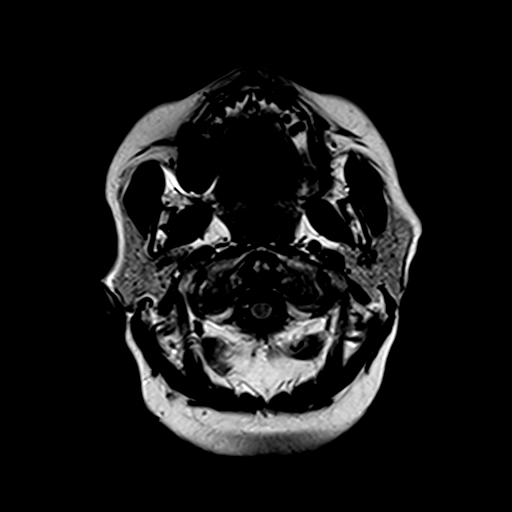

[Series 25: FLAIR · sagittal · 5.0mm · 0.45mm/px · 1 of 22 slices shown (2 of 2)]
[im 1/22]
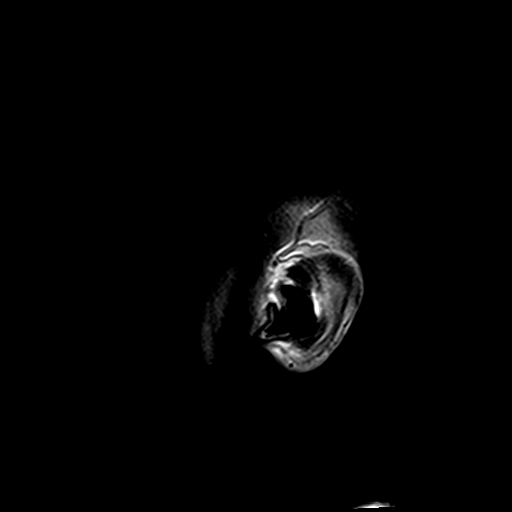

[Series 26: T2 · coronal · 5.0mm · 0.45mm/px · 2 of 24 slices shown (2 of 2)]
[im 1/24]
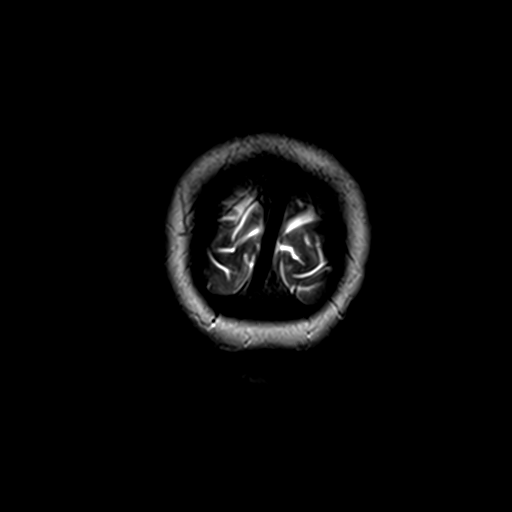
[im 24/24]
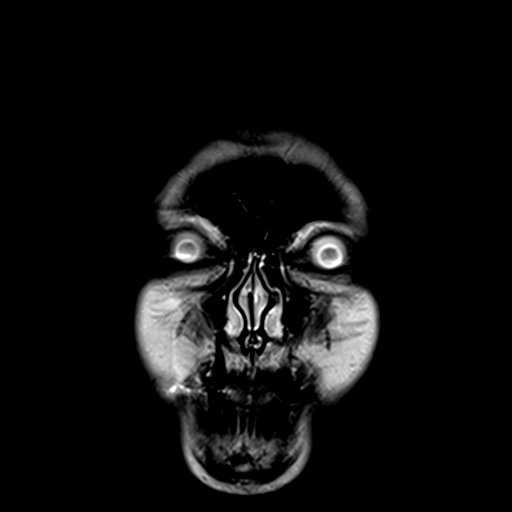

[Series 27: T1 post-contrast · coronal · 5.0mm · 0.43mm/px · 2 of 24 slices shown]
[im 1/24]
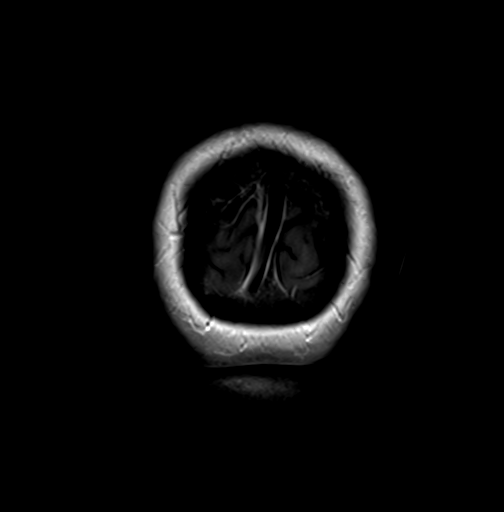
[im 24/24]
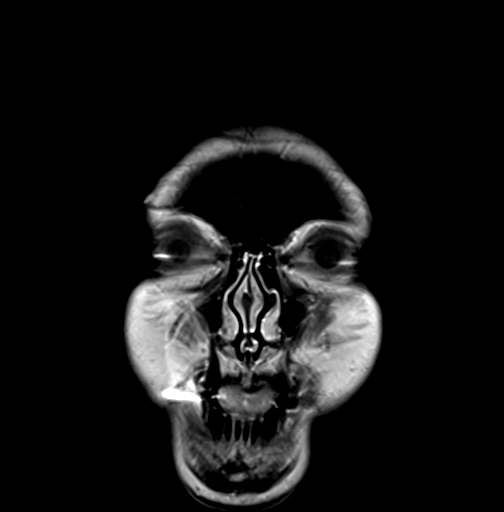

[30 of 48 positions shown; findings below may reference images not displayed]

FINDINGS: MRI HEAD FINDINGS

There is no evidence of acute infarct, intracranial hemorrhage,
mass, midline shift, or extra-axial fluid collection. Ventricles and
sulci are normal. Small foci of T2 hyperintensity in the subcortical
and deep cerebral white matter are unchanged. No abnormal
enhancement is identified.

Orbits are unremarkable. A small left maxillary sinus mucous
retention cyst is noted. Mastoid air cells are clear. Major
intracranial vascular flow voids are preserved.

MRA HEAD FINDINGS

The visualized distal vertebral arteries are patent and equal in
size. Left PICA origin is patent. AICA and SCA origins are patent.
Basilar artery is patent without stenosis. There are fetal type
origins of both PCAs. There are moderate to severe right and severe
left P2 segment stenoses, with mild branch vessel irregularity and
attenuation more distally.

Internal carotid arteries are patent from skullbase to carotid
termini without stenosis. Incidental note is made of a median artery
of the corpus callosum. There is mild narrowing versus artifact in
the distal right A1 segment. Left A1 segment and both M1 segments
are patent without stenosis. There is mild-to-moderate irregularity
of MCA branch vessels bilaterally. No intracranial aneurysm is
identified.
IMPRESSION: 1. No acute intracranial abnormality or mass.
2. Unchanged cerebral white matter disease, nonspecific but may
reflect chronic small vessel ischemia. Other considerations include
sequelae of trauma, hypercoagulable state, vasculitis, migraines,
prior infection and less likely demyelination.
3. Mild-to-moderate anterior and posterior circulation branch vessel
irregularity suggestive of atherosclerosis.
4. Moderate to severe right and severe left P2 PCA stenoses.

## 2016-04-15 ENCOUNTER — Other Ambulatory Visit: Payer: Self-pay | Admitting: Family Medicine

## 2016-04-15 DIAGNOSIS — M25579 Pain in unspecified ankle and joints of unspecified foot: Secondary | ICD-10-CM

## 2016-04-17 ENCOUNTER — Other Ambulatory Visit: Payer: Self-pay | Admitting: Family Medicine

## 2016-04-17 DIAGNOSIS — M25579 Pain in unspecified ankle and joints of unspecified foot: Secondary | ICD-10-CM

## 2016-04-17 MED ORDER — HYDROCODONE-ACETAMINOPHEN 10-325 MG PO TABS: 1.0000 | ORAL_TABLET | Freq: Three times a day (TID) | ORAL | 0 refills | Status: DC | PRN

## 2016-04-18 MED FILL — HYDROCODON-APAP 10-325: 10-325 | 22 days supply | Qty: 65 | Fill #0

## 2016-05-02 MED FILL — CARVEDILOL 6.25 MG TABLET: 6.25 | 90 days supply | Qty: 180 | Fill #0

## 2016-05-06 ENCOUNTER — Encounter: Payer: Self-pay | Admitting: Physician Assistant

## 2016-05-06 ENCOUNTER — Ambulatory Visit (INDEPENDENT_AMBULATORY_CARE_PROVIDER_SITE_OTHER): Payer: Commercial Managed Care - HMO | Admitting: Physician Assistant

## 2016-05-06 VITALS — BP 124/68 | HR 65 | Temp 98.1°F | Resp 16 | Ht 66.0 in | Wt 159.5 lb

## 2016-05-06 DIAGNOSIS — G8929 Other chronic pain: Secondary | ICD-10-CM

## 2016-05-06 DIAGNOSIS — R5383 Other fatigue: Secondary | ICD-10-CM

## 2016-05-06 DIAGNOSIS — R070 Pain in throat: Secondary | ICD-10-CM

## 2016-05-06 DIAGNOSIS — R1013 Epigastric pain: Secondary | ICD-10-CM | POA: Diagnosis not present

## 2016-05-06 LAB — CBC
HCT: 41.5 % (ref 36.0–46.0)
HEMOGLOBIN: 14 g/dL (ref 12.0–15.0)
MCHC: 33.7 g/dL (ref 30.0–36.0)
MCV: 92.5 fl (ref 78.0–100.0)
Platelets: 319 10*3/uL (ref 150.0–400.0)
RBC: 4.49 Mil/uL (ref 3.87–5.11)
RDW: 13.2 % (ref 11.5–15.5)
WBC: 8.5 10*3/uL (ref 4.0–10.5)

## 2016-05-06 LAB — COMPREHENSIVE METABOLIC PANEL
ALBUMIN: 4.2 g/dL (ref 3.5–5.2)
ALK PHOS: 101 U/L (ref 39–117)
ALT: 16 U/L (ref 0–35)
AST: 17 U/L (ref 0–37)
BILIRUBIN TOTAL: 0.4 mg/dL (ref 0.2–1.2)
BUN: 10 mg/dL (ref 6–23)
CALCIUM: 9.2 mg/dL (ref 8.4–10.5)
CO2: 30 meq/L (ref 19–32)
Chloride: 103 mEq/L (ref 96–112)
Creatinine, Ser: 0.68 mg/dL (ref 0.40–1.20)
GFR: 92.07 mL/min (ref >60.00)
Glucose, Bld: 88 mg/dL (ref 70–99)
Potassium: 4.2 mEq/L (ref 3.5–5.1)
Sodium: 139 mEq/L (ref 135–145)
Total Protein: 7.5 g/dL (ref 6.0–8.3)

## 2016-05-06 LAB — FOLATE: FOLATE: 17.7 ng/mL (ref >5.9)

## 2016-05-06 LAB — VITAMIN D 25 HYDROXY (VIT D DEFICIENCY, FRACTURES): VITD: 35.44 ng/mL (ref 30.00–100.00)

## 2016-05-06 LAB — LIPASE: LIPASE: 6 U/L — AB (ref 11.0–59.0)

## 2016-05-06 LAB — TSH: TSH: 0.86 u[IU]/mL (ref 0.35–4.50)

## 2016-05-06 LAB — VITAMIN B12: Vitamin B-12: 572 pg/mL (ref 211–911)

## 2016-05-06 MED ORDER — VENLAFAXINE HCL ER 37.5 MG PO CP24: 37.5000 mg | ORAL_CAPSULE | Freq: Every day | ORAL | 1 refills | Status: DC

## 2016-05-06 MED ORDER — PANTOPRAZOLE SODIUM 40 MG PO TBEC: 40.0000 mg | DELAYED_RELEASE_TABLET | Freq: Every day | ORAL | 3 refills | Status: DC

## 2016-05-06 MED ORDER — ZOLPIDEM TARTRATE 5 MG PO TABS: 5.0000 mg | ORAL_TABLET | Freq: Every evening | ORAL | 1 refills | Status: DC | PRN

## 2016-05-06 MED FILL — ZOLPIDEM TARTRATE 5 MG TAB: 5 | 15 days supply | Qty: 15 | Fill #0

## 2016-05-06 MED FILL — VENLAFAXINE HCL ER 37.5 MG: 37.5 | 30 days supply | Qty: 30 | Fill #0

## 2016-05-06 MED FILL — PANTOPRAZOLE SOD DR 40 MG T: 40 | 30 days supply | Qty: 30 | Fill #0

## 2016-05-06 NOTE — Patient Instructions (Signed)
Please go to the lab for blood work. I will call you with your results.  Please continue chronic medications with the following exceptions: - Stop Prilosec. Start the Protonix as directed. - Restart the Carafate. - I took the Trazodone off the list. We will attempt trial of Ambien 5 mg nightly.  - We will start an trial of Effexor Xr 37.5 mg daily for anxiety and depressed mood  Please schedule a follow-up with your Cardiologist and Gastroenterologist for ongoing symptoms.  Follow-up with Dr. Charlett Blake in 3-4 weeks. If you note any worsening symptoms on new medications, please return immediately.

## 2016-05-06 NOTE — Progress Notes (Signed)
Patient presents to clinic today c/o chronic fatigue over the past several months. Associated with depressed mood with anhedonia and changes in sleep patterns. Denies SI/HI but feels withdrawn. Denies change to appetite presently.  Endorses averaging about 1-2 hours of sleep per night. Is currently prescribed Trazodone 50 mg for sleep but states is subtherapeutic. Endorses doing well on Ambien previously. Patient with history of low Vitamin D, currently on 2000 units daily. Has not had repeat Vitamin D level drawn. With fatigue patient endorses some mild SOB with exertion. Denies chest pain with rest or exertion. Denies lightheadedness or syncope. Patient has noted some mild changes in memory. Noting some short-term memory loss -- recent people, tasks. Denies change in remote memory. Notes some difficulty with focus due to depressed mood. Also noting increased anxiety with panic attacks occurring daily. Is currently on Xanax for acute anxiety with relief of symptoms. Treatment for depression/anxiety has been discussed previously by PCP. Notes she has been on multiple drugs previously with side effect including Zoloft, Cymbalta and Lexapro. Cannot remember side effect. Notes being on Prozac in her youth without side effect and with good results.  Patient also endorses chronic epigastric pain for which she is followed by both Gastroenterology and ENT. Patient endorses pain is in the epigastric region but sometimes radiated to LUQ. Symptoms are worse with eating. Denies nausea or vomiting at present. Endorses alternating constipation and diarrhea. Is taking Hyosycamine TID which helps with meal-time pain. Is currently on Prilosec 40 mg and Zantac 300 mg nightly. Notes subtherapeutic. With GI, patient recently had upper endoscopy study revealing some inflammation. Prior colonoscopy with colon polyps. GI has recommend repeat EGD and further assessment but patient did not follow-up as directed.   Patient has also  noted chronic sore throat bilaterally. Inflammation noted on EGD. Patient was sent to ENT and had a flexible laryngoscopy (normal per patient). Patient was started on trial or anti-yeast medication for thrush and encouraged to follow-up. Patient has not followed with specialist as directed.  Past Medical History:  Diagnosis Date  . 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 Allergic state 12/31/2012  . Anxiety   . Asthma   . Barrett esophagus   . Blind loop syndrome   . C. difficile diarrhea   . Chest pain, atypical 07/14/2011  . Colon polyp   . Contact dermatitis 03/23/2012  . Cystocele, midline   . Depression   . Diarrhea   . Diverticulosis   . Endometrial hyperplasia 02/27/2006   BENIGN ENDO BX ON 02/2007  . Family history of malignant neoplasm of gastrointestinal tract    multiple members  . GERD (gastroesophageal reflux disease)   . Headache 04/16/2015  . Headache(784.0) 04/15/2012  . Hematuria 04/27/2012  . Hepatic cyst   . Hiatal hernia   . Hiatal hernia   . Hyperlipidemia, mixed 12/17/2014  . Hypertension   . IBS (irritable bowel syndrome)   . Insomnia   . Leaky heart valve   . Low back pain 08/15/2013  . Osteoporosis 05/2011   T score-2.5 Lt femoral neck 2012, T score -2.4 2016 FRAX 12%/2.4%  . 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 Paronychia of great toe, left 06/19/2013  . Preventative health care 09/11/2014  . Rectal bleeding 10/10/2011  . Rectocele   . 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 Sinusitis acute 02/03/2012  . Thrush 07/21/2011  . 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 Unspecified hypothyroidism   . Unspecified menopausal and postmenopausal disorder   .  Uterine prolapse without mention of vaginal wall prolapse   . Vitamin B12 deficiency     Current Outpatient Prescriptions on File Prior to Visit  Medication Sig Dispense Refill  . albuterol (VENTOLIN HFA) 108 (90 Base) MCG/ACT inhaler Inhale 2 puffs into the lungs every 6 (six) hours as needed  for wheezing or shortness of breath. 18 g 5  . ALL DAY ALLERGY 10 MG tablet Take 1 tablet by mouth daily as needed.   3  . alprazolam (XANAX) 2 MG tablet TAKE 1 TABLET BY MOUTH TWICE DAILY AS NEEDED FOR SLEEP 60 tablet 1  . Artificial Tear Ointment (DRY EYES OP) Place 1 drop into both eyes daily as needed (for dry eyes).    Marland Kitchen aspirin 81 MG tablet Take 81 mg by mouth daily with supper.     Marland Kitchen azelastine (ASTELIN) 0.1 % nasal spray Place 2 sprays into both nostrils 2 (two) times daily. Use in each nostril as directed 30 mL 12  . carvedilol (COREG) 6.25 MG tablet Take 1 tablet (6.25 mg total) by mouth 2 (two) times daily. 180 tablet 3  . cyclobenzaprine (FLEXERIL) 10 MG tablet Take 1 tablet (10 mg total) by mouth 3 (three) times daily as needed for muscle spasms. 40 tablet 1  . diphenhydramine-acetaminophen (TYLENOL PM) 25-500 MG TABS tablet Take 1 tablet by mouth at bedtime as needed (for sleep).    . fexofenadine (ALLEGRA ALLERGY) 180 MG tablet Take 1 tablet (180 mg total) by mouth 2 (two) times daily as needed for allergies or rhinitis.    Marland Kitchen HYDROcodone-acetaminophen (NORCO) 10-325 MG tablet Take 1 tablet by mouth every 8 (eight) hours as needed. 65 tablet 0  . hyoscyamine (LEVBID) 0.375 MG 12 hr tablet Take 1 tablet (0.375 mg total) by mouth 3 (three) times daily as needed. 90 tablet 1  . lactobacillus acidophilus (BACID) TABS tablet Take 2 tablets by mouth 3 (three) times daily.    . montelukast (SINGULAIR) 10 MG tablet Take 1 tablet (10 mg total) by mouth at bedtime. (Patient taking differently: Take 10 mg by mouth at bedtime as needed (for asthma symptoms). ) 30 tablet 2  . ranitidine (ZANTAC) 300 MG tablet TAKE 1 TABLET BY MOUTH AT BEDTIME 90 tablet 1  . sucralfate (CARAFATE) 1 g tablet Take 1 g by mouth 4 (four) times daily -  with meals and at bedtime.  1  . traZODone (DESYREL) 50 MG tablet Take 0.5-1 tablets (25-50 mg total) by mouth at bedtime as needed for sleep. 30 tablet 1  . fluticasone  (FLONASE) 50 MCG/ACT nasal spray PLACE 2 SPRAYS INTO BOTH NOSTRILS DAILY 16 g 6   No current facility-administered medications on file prior to visit.     Allergies  Allergen Reactions  . Sulfamethoxazole Shortness Of Breath    chest tightness  . Amitriptyline Anxiety and Other (See Comments)    Depression, insomnia  . Iohexol Other (See Comments)    Passed out  . Lactose Intolerance (Gi) Diarrhea and Nausea And Vomiting  . Iodinated Diagnostic Agents     Pt states at Dr.Grapeys office 15 years ago blacked out for 2 hours during injection for IVP. Was told never to have IV dye again.     Family History  Problem Relation Age of Onset  . Colon cancer Mother   . Diabetes Mother   . Hypertension Mother   . Colon cancer Mother   . Colon polyps Father   . Parkinson's disease Father   .  Colon cancer Maternal Aunt   . Colon cancer Maternal Grandmother   . Breast cancer Maternal Grandmother 52  . Diabetes Paternal Grandmother   . Breast cancer Paternal Grandmother 46  . Colon cancer Cousin     X3  . Ovarian cancer Cousin   . Allergies Sister   . Arthritis Brother     b/l hip replace  . Alcohol abuse Daughter   . Arthritis Son   . Hypertension Son   . Allergies Son   . Osteoporosis Son   . Osteoporosis Son   . Arthritis Son     back disease, DDD  . Allergies Son   . Arthritis Son   . Irritable bowel syndrome Son     RSD  . Hypertension Paternal Grandfather   . Heart disease Paternal Grandfather   . Aneurysm Paternal Grandfather     Social History   Social History  . Marital status: Married    Spouse name: N/A  . Number of children: 4  . Years of education: N/A   Occupational History  . Retired    Social History Main Topics  . Smoking status: Former Smoker    Quit date: 08/19/1995  . Smokeless tobacco: Never Used  . Alcohol use No  . Drug use: No  . Sexual activity: Not Currently    Birth control/ protection: Post-menopausal     Comment: 1st intercourse 7  yo-5 partners   Other Topics Concern  . None   Social History Narrative  . None   Review of Systems - See HPI.  All other ROS are negative.  BP 124/68 (BP Location: Right Arm, Patient Position: Sitting, Cuff Size: Normal)   Pulse 65   Temp 98.1 F (36.7 C) (Oral)   Resp 16   Ht _0  (1.676 m)   Wt 159 lb 8 oz (72.3 kg)   SpO2 97%   BMI 25.74 kg/m   Physical Exam  Constitutional: She is oriented to person, place, and time and well-developed, well-nourished, and in no distress.  HENT:  Head: Normocephalic and atraumatic.  Right Ear: External ear normal.  Left Ear: External ear normal.  Nose: Nose normal.  Mouth/Throat: Oropharynx is clear and moist. No oropharyngeal exudate.  Eyes: Conjunctivae are normal. Pupils are equal, round, and reactive to light.  Neck: Neck supple. No thyromegaly present.  Cardiovascular: Normal rate, regular rhythm, normal heart sounds and intact distal pulses.   Pulmonary/Chest: Effort normal and breath sounds normal. No respiratory distress. She has no wheezes. She has no rales. She exhibits no tenderness.  Abdominal: Soft. Bowel sounds are normal. She exhibits no distension. There is no tenderness.  Neurological: She is alert and oriented to person, place, and time.  Skin: Skin is warm and dry. No rash noted.  Psychiatric: She exhibits a depressed mood. She expresses no homicidal and no suicidal ideation. She has a flat affect.  Vitals reviewed.   Recent Results (from the past 2160 hour(s))  VITAMIN D 25 Hydroxy (Vit-D Deficiency, Fractures)     Status: Abnormal   Collection Time: 02/18/16 11:29 AM  Result Value Ref Range   Vit D, 25-Hydroxy 28 (L) 30 - 100 ng/mL    Comment: Vitamin D Status           25-OH Vitamin D        Deficiency                <20 ng/mL        Insufficiency  20 - 29 ng/mL        Optimal             > or = 30 ng/mL   For 25-OH Vitamin D testing on patients on D2-supplementation and patients for whom  quantitation of D2 and D3 fractions is required, the [facility] 25-OH VIT D, (D2,D3), LC/MS/MS is recommended: order code [id number] (patients > 2 yrs).   TSH     Status: None   Collection Time: 05/06/16 12:45 PM  Result Value Ref Range   TSH 0.86 0.35 - 4.50 uIU/mL  T4     Status: None   Collection Time: 05/06/16 12:45 PM  Result Value Ref Range   T4, Total 9.1 4.5 - 12.0 ug/dL  Comp Met (CMET)     Status: None   Collection Time: 05/06/16 12:45 PM  Result Value Ref Range   Sodium 139 135 - 145 mEq/L   Potassium 4.2 3.5 - 5.1 mEq/L   Chloride 103 96 - 112 mEq/L   CO2 30 19 - 32 mEq/L   Glucose, Bld 88 70 - 99 mg/dL   BUN 10 6 - 23 mg/dL   Creatinine, Ser 0.68 0.40 - 1.20 mg/dL   Total Bilirubin 0.4 0.2 - 1.2 mg/dL   Alkaline Phosphatase 101 39 - 117 U/L   AST 17 0 - 37 U/L   ALT 16 0 - 35 U/L   Total Protein 7.5 6.0 - 8.3 g/dL   Albumin 4.2 3.5 - 5.2 g/dL   Calcium 9.2 8.4 - 10.5 mg/dL   GFR 92.07 >60.00 mL/min  B12     Status: None   Collection Time: 05/06/16 12:45 PM  Result Value Ref Range   Vitamin B-12 572 211 - 911 pg/mL  Vitamin D (25 hydroxy)     Status: None   Collection Time: 05/06/16 12:45 PM  Result Value Ref Range   VITD 35.44 30.00 - 100.00 ng/mL  CBC     Status: None   Collection Time: 05/06/16 12:45 PM  Result Value Ref Range   WBC 8.5 4.0 - 10.5 K/uL   RBC 4.49 3.87 - 5.11 Mil/uL   Platelets 319.0 150.0 - 400.0 K/uL   Hemoglobin 14.0 12.0 - 15.0 g/dL   HCT 41.5 36.0 - 46.0 %   MCV 92.5 78.0 - 100.0 fl   MCHC 33.7 30.0 - 36.0 g/dL   RDW 13.2 11.5 - 15.5 %  Folate     Status: None   Collection Time: 05/06/16 12:45 PM  Result Value Ref Range   Folate 17.7 >5.9 ng/mL  Lipase     Status: Abnormal   Collection Time: 05/06/16 12:45 PM  Result Value Ref Range   Lipase 6.0 (L) 11.0 - 59.0 U/L    Assessment/Plan: 1. Other fatigue Seems related to significant depression. Will attempt trial of Effexor XR. Will restart Ambien short-term to help with  sleep. Sleep hygiene reviewed. Stop Trazodone. Xanax PRN. Will obtain lab assessment today (see below). Follow-up with PCP scheduled. - TSH - T4 - Comp Met (CMET) - B12 - Vitamin D (25 hydroxy) - CBC - Folate  2. Abdominal pain, chronic, epigastric Will check CMP. Will stop prilosec and begin Protonix. Continue Ranitidine. Dietary measures discussed. She is aware she needs to schedule FU with GI for further assessment and repeat EGD. - Comp Met (CMET) - pantoprazole (PROTONIX) 40 MG tablet; Take 1 tablet (40 mg total) by mouth daily.  Dispense: 30 tablet; Refill: 3 - Lipase  3.  Chronic throat pain Exam unremarkable. We are working on regimen for GERD and esophagitis. She is to schedule FU with GI and ENT.     Leeanne Rio, PA-C

## 2016-05-07 LAB — T4: T4 TOTAL: 9.1 ug/dL (ref 4.5–12.0)

## 2016-05-09 IMAGING — US US RENAL
1 series · 13 of 25 positions shown · non-contrast
Comparison: Prior CT scan of the abdomen and pelvis with
intravenous contrast 05/25/2014.

CLINICAL DATA: 63-year-old female with hypertension and right-sided
back pain. Evaluate for renal artery stenosis.

EXAM:
RENAL/URINARY TRACT ULTRASOUND
RENAL DUPLEX ULTRASOUND

[Series 1: us renal · 0.30mm/px · 13 of 59 slices shown]
[im 1/59]
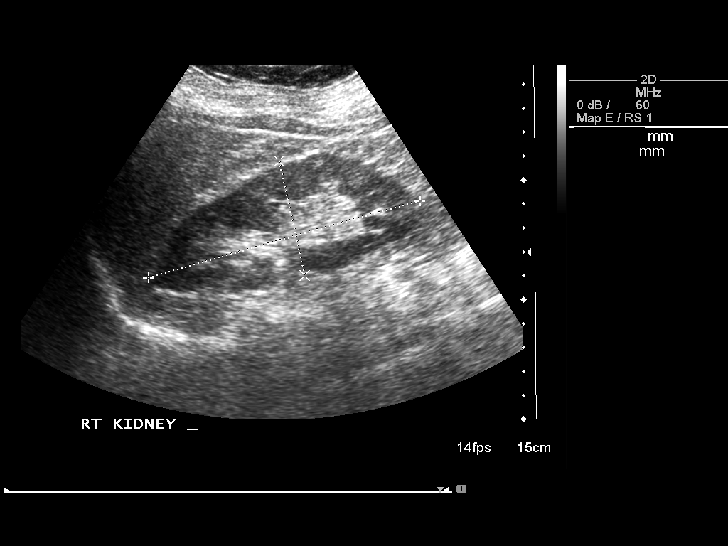
[im 5/59]
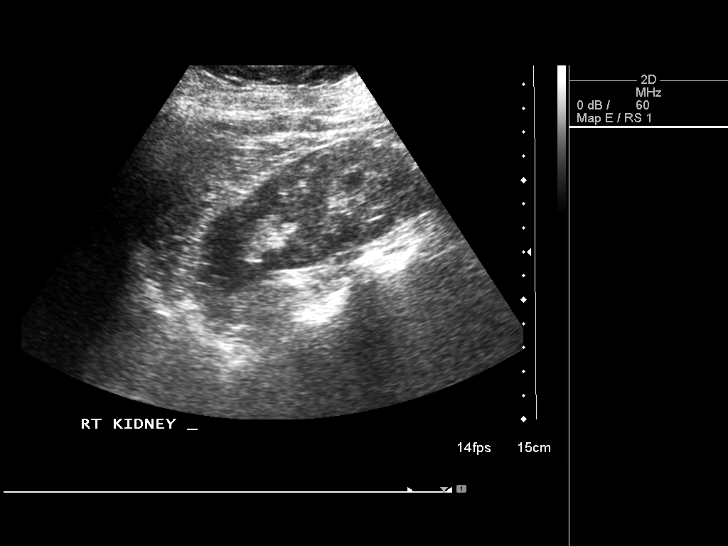
[im 10/59]
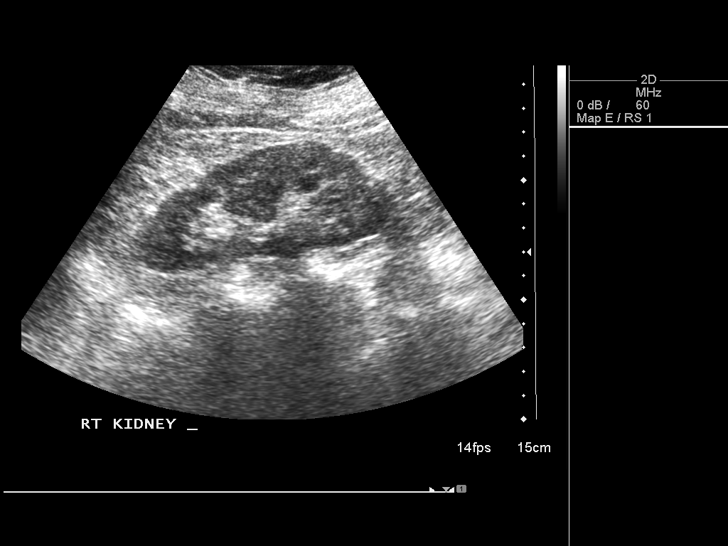
[im 15/59]
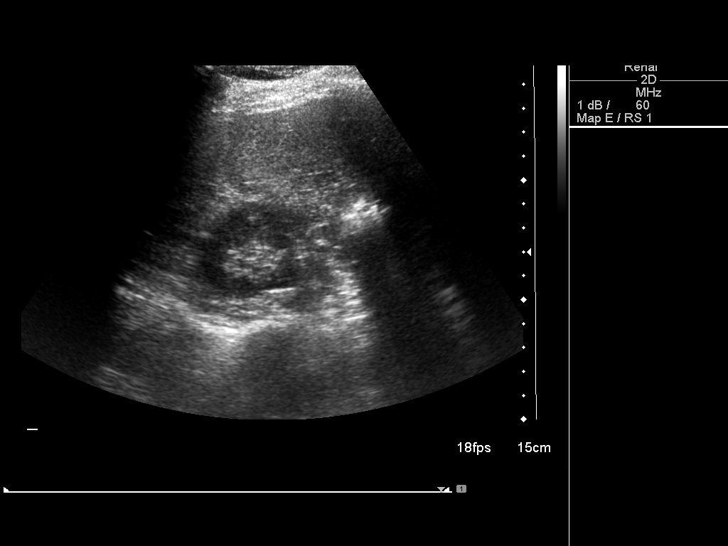
[im 20/59]
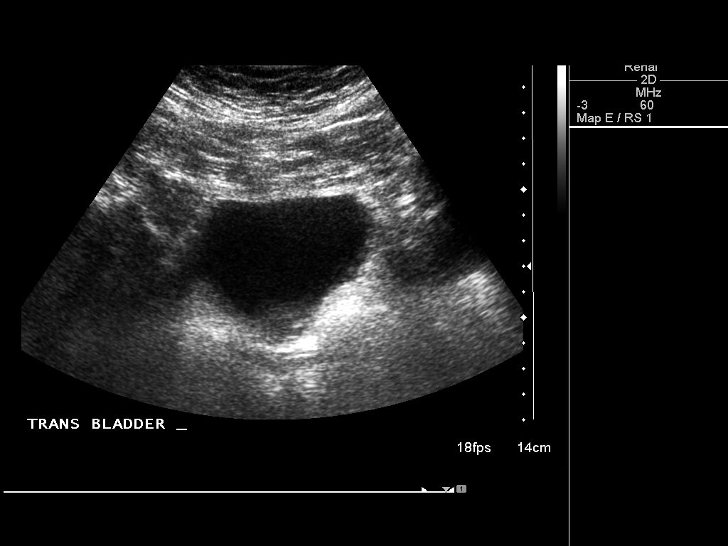
[im 25/59]
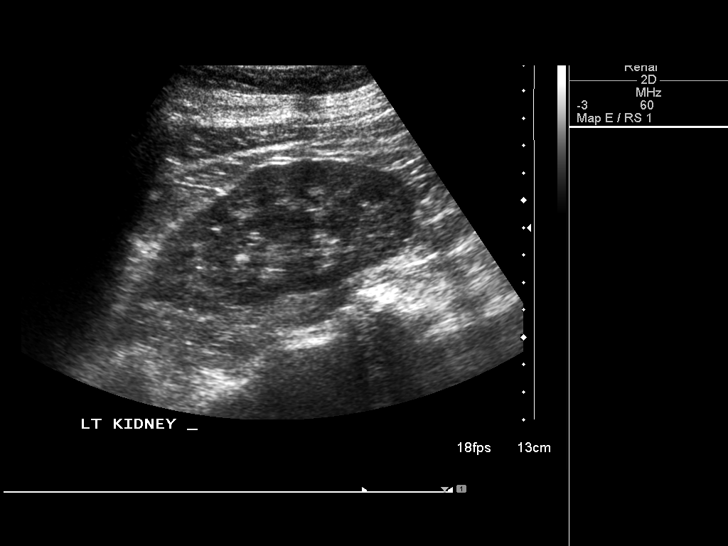
[im 30/59]
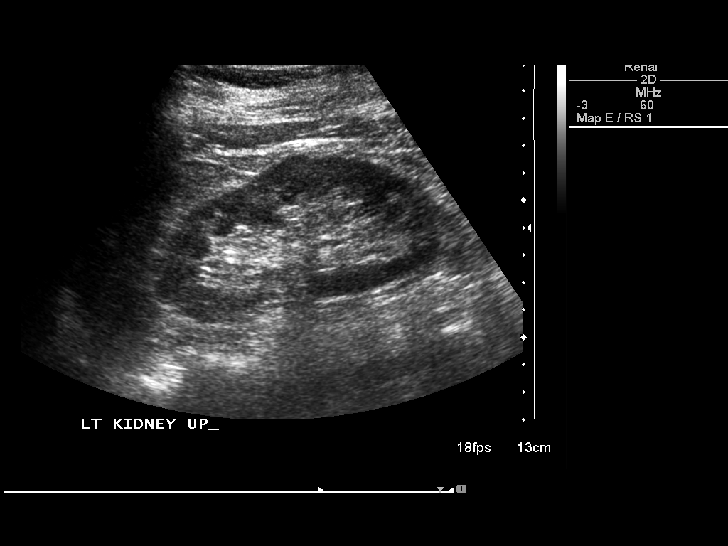
[im 34/59]
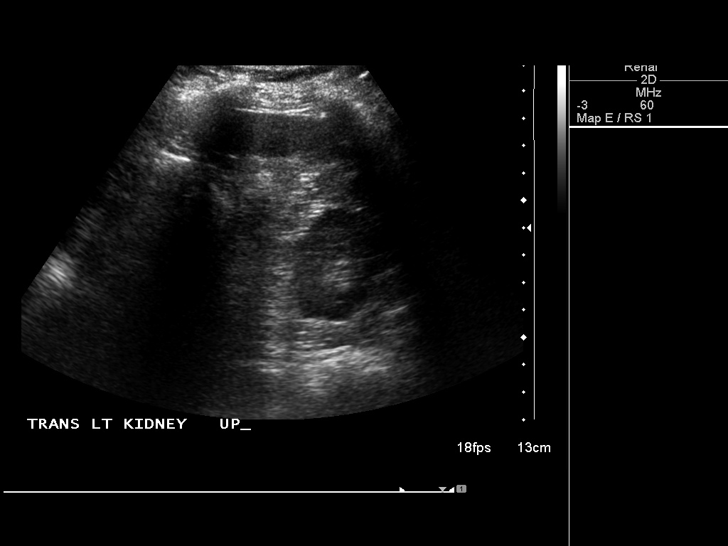
[im 39/59]
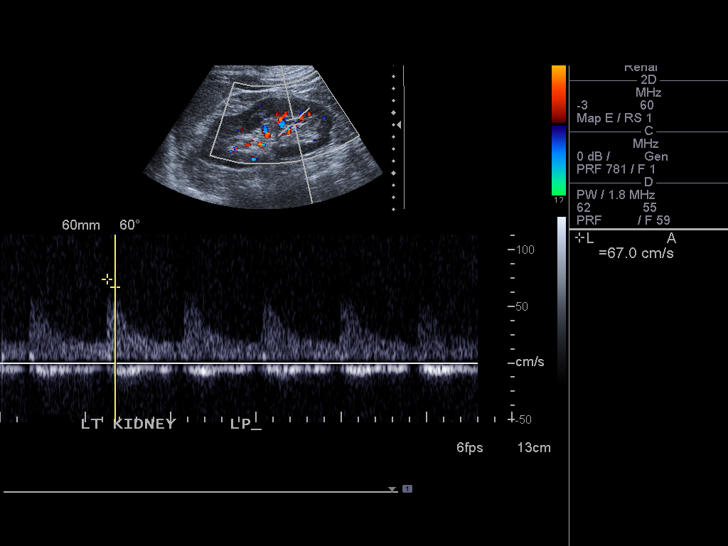
[im 44/59]
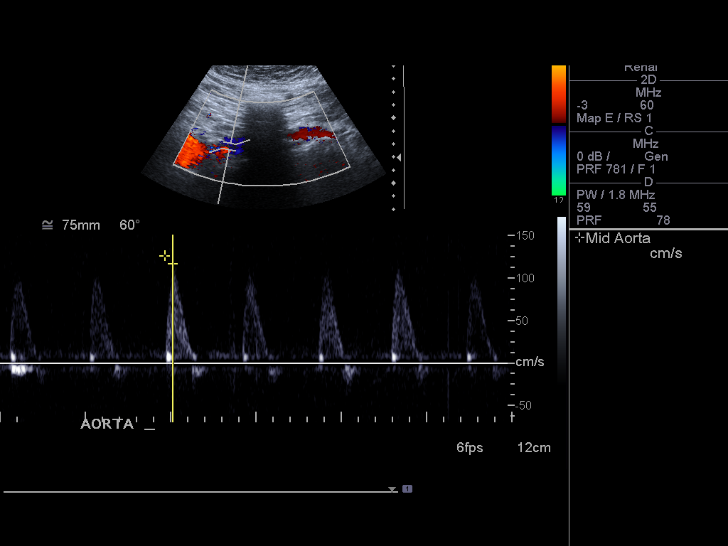
[im 49/59]
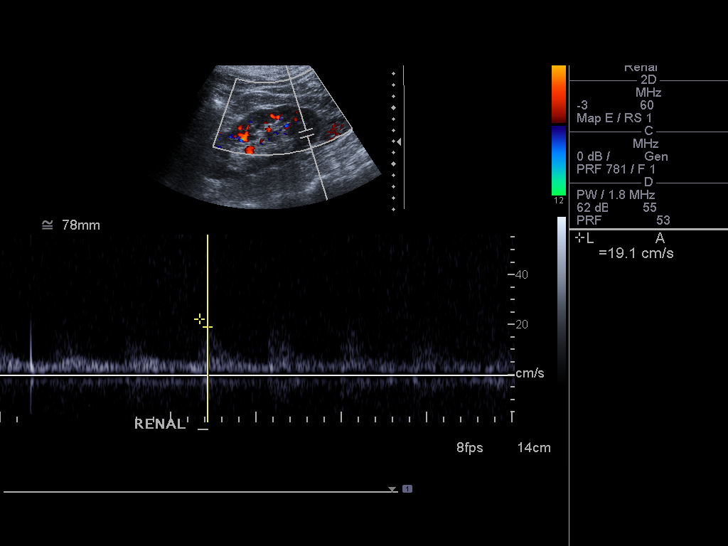
[im 54/59]
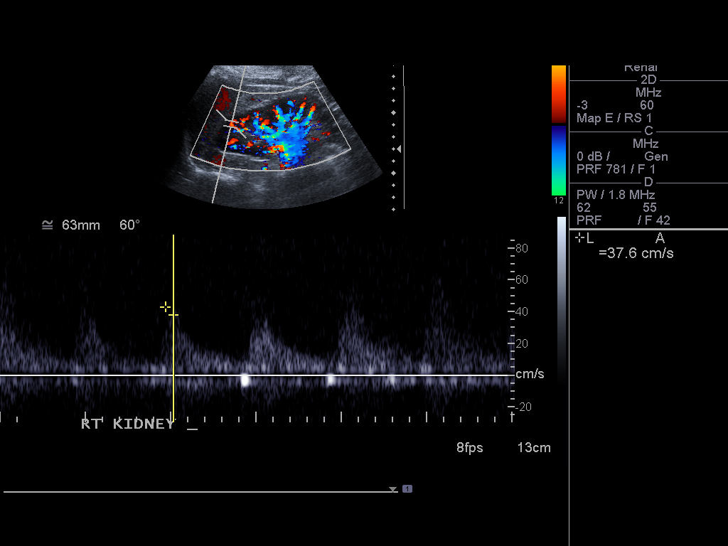
[im 59/59]
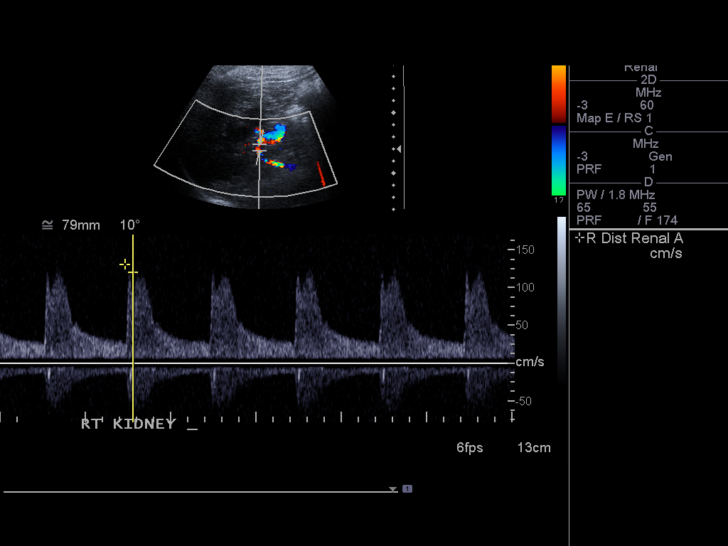

[13 of 25 positions shown; findings below may reference images not displayed]

FINDINGS: Right Kidney:

Length: 11.8. Echogenicity within normal limits. No mass or
hydronephrosis visualized.

Left Kidney:

Length: 11.4. Echogenicity within normal limits. No mass or
hydronephrosis visualized.

Bladder:  Within normal limits

RENAL DUPLEX ULTRASOUND

Right Renal Artery Velocities:

Origin:  116 cm/sec

Mid:  113 cm/sec

Hilum:  121 cm/sec

Interlobar:  65 cm/sec

Arcuate:  38 Cm/sec

Left Renal Artery Velocities:

Origin:  162 cm/sec

Mid:  179 cm/sec

Hilum:  234 cm/sec

Interlobar:  67 cm/sec

Arcuate:  29 cm/sec

Aortic Velocity:  117 Cm/sec

Right Renal-Aortic Ratios:

Origin: 1

Mid:  1

Hilum: 1

Interlobar:

Arcuate:

Left Renal-Aortic Ratios:

Origin:

Mid:

Hilum: 2

Interlobar:

Arcuate:
IMPRESSION: 1. No evidence of hemodynamically significant stenosis in the
proximal or mid renal arteries bilaterally.
2. There is questionable significance elevation of the peak systolic
velocity in the more distal left renal artery in the region of the
hilum. However, the vessels are often tortuous in this anatomic
region and this velocity elevation may be exaggerated.
3. Normal sonographic appearance of the kidneys, no renal size
discrepancy, hydronephrosis or nephrolithiasis.

## 2016-05-13 ENCOUNTER — Other Ambulatory Visit: Payer: Self-pay | Admitting: Family Medicine

## 2016-05-13 MED FILL — CARTIA XT 180 MG CAPSULE SA: 180 | 90 days supply | Qty: 90 | Fill #0

## 2016-05-15 ENCOUNTER — Other Ambulatory Visit: Payer: Self-pay | Admitting: Family Medicine

## 2016-05-15 DIAGNOSIS — M25579 Pain in unspecified ankle and joints of unspecified foot: Secondary | ICD-10-CM

## 2016-05-15 MED ORDER — HYDROCODONE-ACETAMINOPHEN 10-325 MG PO TABS: 1.0000 | ORAL_TABLET | Freq: Three times a day (TID) | ORAL | 0 refills | Status: DC | PRN

## 2016-05-15 NOTE — Telephone Encounter (Signed)
Requesting: Hydrocodone Contract  10/18/2014 UDS   Moderate is now due Last OV 03/18/2016 Last Refill   #65 with 0 refills on 04/17/2016  Please Advise

## 2016-05-16 MED FILL — HYDROCODON-APAP 10-325: 10-325 | 22 days supply | Qty: 65 | Fill #0

## 2016-05-20 ENCOUNTER — Encounter: Payer: Self-pay | Admitting: Physician Assistant

## 2016-05-20 ENCOUNTER — Telehealth: Payer: Self-pay | Admitting: Behavioral Health

## 2016-05-20 ENCOUNTER — Ambulatory Visit (INDEPENDENT_AMBULATORY_CARE_PROVIDER_SITE_OTHER): Payer: Commercial Managed Care - HMO | Admitting: Physician Assistant

## 2016-05-20 ENCOUNTER — Ambulatory Visit (HOSPITAL_BASED_OUTPATIENT_CLINIC_OR_DEPARTMENT_OTHER)
Admission: RE | Admit: 2016-05-20 | Discharge: 2016-05-20 | Disposition: A | Discharge status: Home / Self Care | Payer: Commercial Managed Care - HMO | Source: Ambulatory Visit | Attending: Physician Assistant | Admitting: Physician Assistant

## 2016-05-20 VITALS — BP 116/70 | HR 73 | Temp 98.3°F | Resp 16 | Ht 66.0 in | Wt 162.2 lb

## 2016-05-20 DIAGNOSIS — K5900 Constipation, unspecified: Secondary | ICD-10-CM | POA: Insufficient documentation

## 2016-05-20 DIAGNOSIS — R1012 Left upper quadrant pain: Secondary | ICD-10-CM | POA: Diagnosis not present

## 2016-05-20 LAB — COMPREHENSIVE METABOLIC PANEL
ALT: 18 U/L (ref 0–35)
AST: 19 U/L (ref 0–37)
Albumin: 4.2 g/dL (ref 3.5–5.2)
Alkaline Phosphatase: 97 U/L (ref 39–117)
BUN: 5 mg/dL — ABNORMAL LOW (ref 6–23)
CO2: 30 meq/L (ref 19–32)
Calcium: 9 mg/dL (ref 8.4–10.5)
Chloride: 104 mEq/L (ref 96–112)
Creatinine, Ser: 0.61 mg/dL (ref 0.40–1.20)
GFR: 104.35 mL/min (ref >60.00)
GLUCOSE: 95 mg/dL (ref 70–99)
POTASSIUM: 3.8 meq/L (ref 3.5–5.1)
Sodium: 140 mEq/L (ref 135–145)
Total Bilirubin: 0.4 mg/dL (ref 0.2–1.2)
Total Protein: 7.5 g/dL (ref 6.0–8.3)

## 2016-05-20 LAB — CBC WITH DIFFERENTIAL/PLATELET
BASOS ABS: 0 10*3/uL (ref 0.0–0.1)
Basophils Relative: 0.4 % (ref 0.0–3.0)
Eosinophils Absolute: 0.1 10*3/uL (ref 0.0–0.7)
Eosinophils Relative: 2.1 % (ref 0.0–5.0)
HCT: 40.1 % (ref 36.0–46.0)
Hemoglobin: 13.6 g/dL (ref 12.0–15.0)
LYMPHS ABS: 2.5 10*3/uL (ref 0.7–4.0)
Lymphocytes Relative: 40.7 % (ref 12.0–46.0)
MCHC: 34 g/dL (ref 30.0–36.0)
MCV: 91.8 fl (ref 78.0–100.0)
MONO ABS: 0.5 10*3/uL (ref 0.1–1.0)
MONOS PCT: 8.4 % (ref 3.0–12.0)
NEUTROS ABS: 2.9 10*3/uL (ref 1.4–7.7)
NEUTROS PCT: 48.4 % (ref 43.0–77.0)
PLATELETS: 327 10*3/uL (ref 150.0–400.0)
RBC: 4.37 Mil/uL (ref 3.87–5.11)
RDW: 13 % (ref 11.5–15.5)
WBC: 6.1 10*3/uL (ref 4.0–10.5)

## 2016-05-20 LAB — LIPASE: Lipase: 4 U/L — ABNORMAL LOW (ref 11.0–59.0)

## 2016-05-20 NOTE — Telephone Encounter (Signed)
Noted.  See result note.  

## 2016-05-20 NOTE — Patient Instructions (Signed)
Please go to the lab for blood work. Then go immediately downstairs for imaging.   Keep your phone on. I am worried about a bowel obstruction and would prefer you be assessed in ER. As you are refusing to go at the moment we will get STAT labs and imaging.  If there is anything alarming on imaging, we will need you to go to the ER.  If you note any vomiting or worsening pain before assessment is complete, please go to the ER.

## 2016-05-20 NOTE — Telephone Encounter (Signed)
Caller: [facility] [phone]  Reason for call: Results of CT Abdomen   She reported the following findings: moderate to large colonic stool burden without obstruction or free air.  Message routed to [place], [place] for review.

## 2016-05-20 NOTE — Progress Notes (Signed)
Pre visit review using our clinic review tool, if applicable. No additional management support is needed unless otherwise documented below in the visit note/SLS  

## 2016-05-21 ENCOUNTER — Encounter: Payer: Self-pay | Admitting: Physician Assistant

## 2016-05-21 ENCOUNTER — Other Ambulatory Visit: Payer: Self-pay | Admitting: Physician Assistant

## 2016-05-21 DIAGNOSIS — G8929 Other chronic pain: Secondary | ICD-10-CM

## 2016-05-21 DIAGNOSIS — K589 Irritable bowel syndrome without diarrhea: Secondary | ICD-10-CM

## 2016-05-21 DIAGNOSIS — R1013 Epigastric pain: Secondary | ICD-10-CM

## 2016-05-21 DIAGNOSIS — F5000 Anorexia nervosa, unspecified: Secondary | ICD-10-CM

## 2016-05-21 DIAGNOSIS — K5900 Constipation, unspecified: Secondary | ICD-10-CM

## 2016-05-21 MED ORDER — SUCRALFATE 1 G PO TABS: 1.0000 g | ORAL_TABLET | Freq: Three times a day (TID) | ORAL | 1 refills | Status: DC

## 2016-05-22 ENCOUNTER — Telehealth: Payer: Self-pay | Admitting: Family Medicine

## 2016-05-22 NOTE — Telephone Encounter (Signed)
Faxed labs/xrays.

## 2016-05-22 NOTE — Telephone Encounter (Signed)
°  Relation to [id number] Call back number:[phone]   Reason for call:   [facility] [address] [place], [place] [address] [phone]  requesting most recent lab results & x ray results fax # [phone]

## 2016-05-26 ENCOUNTER — Encounter: Payer: Self-pay | Admitting: Physician Assistant

## 2016-05-26 DIAGNOSIS — G8929 Other chronic pain: Secondary | ICD-10-CM

## 2016-05-26 DIAGNOSIS — R109 Unspecified abdominal pain: Principal | ICD-10-CM

## 2016-05-26 DIAGNOSIS — K5900 Constipation, unspecified: Secondary | ICD-10-CM

## 2016-05-26 NOTE — Progress Notes (Signed)
Patient with history of chronic abdominal pain, GERD with Barrett' esophagus presents to clinic today c/o 1 week of LUQ and epigastric pain occurring after an episode of heavy lifting. Patient denies pain with ROM. Endorses that she has noted increased heart burn, bloating an gas with symptoms. Is currently on Protonix. Is taking as directed. Patient also notes constipation with only 1 BM in the past week. Denies tenesmus, melena or hematochezia. Patient recently evaluated for multiple chronic issues, including chronic epigastric pain. Was encouraged to schedule FU with her GI but has not done so. Patient also denies fever, chills, alcohol consumption or recent NSAID use.  Past Medical History:  Diagnosis Date  . 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 Allergic state 12/31/2012  . Anxiety   . Asthma   . Barrett esophagus   . Blind loop syndrome   . C. difficile diarrhea   . Chest pain, atypical 07/14/2011  . Colon polyp   . Contact dermatitis 03/23/2012  . Cystocele, midline   . Depression   . Diarrhea   . Diverticulosis   . Endometrial hyperplasia 02/27/2006   BENIGN ENDO BX ON 02/2007  . Family history of malignant neoplasm of gastrointestinal tract    multiple members  . GERD (gastroesophageal reflux disease)   . Headache 04/16/2015  . Headache(784.0) 04/15/2012  . Hematuria 04/27/2012  . Hepatic cyst   . Hiatal hernia   . Hiatal hernia   . Hyperlipidemia, mixed 12/17/2014  . Hypertension   . IBS (irritable bowel syndrome)   . Insomnia   . Leaky heart valve   . Low back pain 08/15/2013  . Osteoporosis 05/2011   T score-2.5 Lt femoral neck 2012, T score -2.4 2016 FRAX 12%/2.4%  . 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 Paronychia of great toe, left 06/19/2013  . Preventative health care 09/11/2014  . Rectal bleeding 10/10/2011  . Rectocele   . 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 Sinusitis acute 02/03/2012  . Thrush 07/21/2011  . Tricuspid regurgitation  07/21/2011  . Unspecified hypothyroidism   . Unspecified menopausal and postmenopausal disorder   . Uterine prolapse without mention of vaginal wall prolapse   . Vitamin B12 deficiency     Current Outpatient Prescriptions on File Prior to Visit  Medication Sig Dispense Refill  . albuterol (VENTOLIN HFA) 108 (90 Base) MCG/ACT inhaler Inhale 2 puffs into the lungs every 6 (six) hours as needed for wheezing or shortness of breath. 18 g 5  . ALL DAY ALLERGY 10 MG tablet Take 1 tablet by mouth daily as needed.   3  . alprazolam (XANAX) 2 MG tablet TAKE 1 TABLET BY MOUTH TWICE DAILY AS NEEDED FOR SLEEP 60 tablet 1  . Artificial Tear Ointment (DRY EYES OP) Place 1 drop into both eyes daily as needed (for dry eyes).    Marland Kitchen azelastine (ASTELIN) 0.1 % nasal spray Place 2 sprays into both nostrils 2 (two) times daily. Use in each nostril as directed 30 mL 12  . CARTIA XT 180 MG 24 hr capsule TAKE ONE CAPSULE BY MOUTH DAILY 90 capsule 0  . carvedilol (COREG) 6.25 MG tablet Take 1 tablet (6.25 mg total) by mouth 2 (two) times daily. 180 tablet 3  . cyclobenzaprine (FLEXERIL) 10 MG tablet Take 1 tablet (10 mg total) by mouth 3 (three) times daily as needed for muscle spasms. 40 tablet 1  . diphenhydramine-acetaminophen (TYLENOL PM) 25-500 MG TABS tablet Take 1 tablet by mouth at  bedtime as needed (for sleep).    . fexofenadine (ALLEGRA ALLERGY) 180 MG tablet Take 1 tablet (180 mg total) by mouth 2 (two) times daily as needed for allergies or rhinitis.    . fluticasone (FLONASE) 50 MCG/ACT nasal spray PLACE 2 SPRAYS INTO BOTH NOSTRILS DAILY 16 g 6  . HYDROcodone-acetaminophen (NORCO) 10-325 MG tablet Take 1 tablet by mouth every 8 (eight) hours as needed. 65 tablet 0  . hyoscyamine (LEVBID) 0.375 MG 12 hr tablet Take 1 tablet (0.375 mg total) by mouth 3 (three) times daily as needed. 90 tablet 1  . lactobacillus acidophilus (BACID) TABS tablet Take 2 tablets by mouth 3 (three) times daily.    . montelukast  (SINGULAIR) 10 MG tablet Take 1 tablet (10 mg total) by mouth at bedtime. (Patient taking differently: Take 10 mg by mouth at bedtime as needed (for asthma symptoms). ) 30 tablet 2  . pantoprazole (PROTONIX) 40 MG tablet Take 1 tablet (40 mg total) by mouth daily. 30 tablet 3  . ranitidine (ZANTAC) 300 MG tablet TAKE 1 TABLET BY MOUTH AT BEDTIME 90 tablet 1  . traZODone (DESYREL) 50 MG tablet Take 0.5-1 tablets (25-50 mg total) by mouth at bedtime as needed for sleep. 30 tablet 1  . zolpidem (AMBIEN) 5 MG tablet Take 1 tablet (5 mg total) by mouth at bedtime as needed for sleep. 15 tablet 1  . aspirin 81 MG tablet Take 81 mg by mouth daily with supper.      No current facility-administered medications on file prior to visit.     Allergies  Allergen Reactions  . Sulfamethoxazole Shortness Of Breath    chest tightness  . Amitriptyline Anxiety and Other (See Comments)    Depression, insomnia  . Iohexol Other (See Comments)    Passed out  . Lactose Intolerance (Gi) Diarrhea and Nausea And Vomiting  . Iodinated Diagnostic Agents     Pt states at Dr.Grapeys office 15 years ago blacked out for 2 hours during injection for IVP. Was told never to have IV dye again.     Family History  Problem Relation Age of Onset  . Colon cancer Mother   . Diabetes Mother   . Hypertension Mother   . Colon cancer Mother   . Colon polyps Father   . Parkinson's disease Father   . Colon cancer Maternal Aunt   . Colon cancer Maternal Grandmother   . Breast cancer Maternal Grandmother 14  . Diabetes Paternal Grandmother   . Breast cancer Paternal Grandmother 74  . Colon cancer Cousin     X3  . Ovarian cancer Cousin   . Allergies Sister   . Arthritis Brother     b/l hip replace  . Alcohol abuse Daughter   . Arthritis Son   . Hypertension Son   . Allergies Son   . Osteoporosis Son   . Osteoporosis Son   . Arthritis Son     back disease, DDD  . Allergies Son   . Arthritis Son   . Irritable bowel  syndrome Son     RSD  . Hypertension Paternal Grandfather   . Heart disease Paternal Grandfather   . Aneurysm Paternal Grandfather     Social History   Social History  . Marital status: Married    Spouse name: N/A  . Number of children: 4  . Years of education: N/A   Occupational History  . Retired    Social History Main Topics  . Smoking status: Former  Smoker    Quit date: 08/19/1995  . Smokeless tobacco: Never Used  . Alcohol use No  . Drug use: No  . Sexual activity: Not Currently    Birth control/ protection: Post-menopausal     Comment: 1st intercourse 89 yo-5 partners   Other Topics Concern  . None   Social History Narrative  . None    Review of Systems - See HPI.  All other ROS are negative.  BP 116/70 (BP Location: Left Arm, Patient Position: Sitting, Cuff Size: Normal)   Pulse 73   Temp 98.3 F (36.8 C) (Oral)   Resp 16   Ht '5\' 6"'$  (1.676 m)   Wt 162 lb 4 oz (73.6 kg)   SpO2 99%   BMI 26.19 kg/m   Physical Exam  Constitutional: She is well-developed, well-nourished, and in no distress.  HENT:  Head: Normocephalic and atraumatic.  Eyes: Conjunctivae are normal.  Neck: Neck supple.  Cardiovascular: Normal rate, regular rhythm, normal heart sounds and intact distal pulses.   Abdominal: Soft. Bowel sounds are normal. She exhibits no distension. There is generalized tenderness.  Generalized tenderness noted to palpation without rebound or guarding. No increased tenderness in epigastric region. Of note, patient seems to not react to palpation when distracted by the student, but when I push in the same area with the same force and ask about pain, patient makes a grimace and notes significant pain.  Skin: Skin is warm and dry. No rash noted.  Psychiatric: Affect normal.  Vitals reviewed.   Recent Results (from the past 2160 hour(s))  TSH     Status: None   Collection Time: 05/06/16 12:45 PM  Result Value Ref Range   TSH 0.86 0.35 - 4.50 uIU/mL  T4      Status: None   Collection Time: 05/06/16 12:45 PM  Result Value Ref Range   T4, Total 9.1 4.5 - 12.0 ug/dL  Comp Met (CMET)     Status: None   Collection Time: 05/06/16 12:45 PM  Result Value Ref Range   Sodium 139 135 - 145 mEq/L   Potassium 4.2 3.5 - 5.1 mEq/L   Chloride 103 96 - 112 mEq/L   CO2 30 19 - 32 mEq/L   Glucose, Bld 88 70 - 99 mg/dL   BUN 10 6 - 23 mg/dL   Creatinine, Ser 0.68 0.40 - 1.20 mg/dL   Total Bilirubin 0.4 0.2 - 1.2 mg/dL   Alkaline Phosphatase 101 39 - 117 U/L   AST 17 0 - 37 U/L   ALT 16 0 - 35 U/L   Total Protein 7.5 6.0 - 8.3 g/dL   Albumin 4.2 3.5 - 5.2 g/dL   Calcium 9.2 8.4 - 10.5 mg/dL   GFR 92.07 >60.00 mL/min  B12     Status: None   Collection Time: 05/06/16 12:45 PM  Result Value Ref Range   Vitamin B-12 572 211 - 911 pg/mL  Vitamin D (25 hydroxy)     Status: None   Collection Time: 05/06/16 12:45 PM  Result Value Ref Range   VITD 35.44 30.00 - 100.00 ng/mL  CBC     Status: None   Collection Time: 05/06/16 12:45 PM  Result Value Ref Range   WBC 8.5 4.0 - 10.5 K/uL   RBC 4.49 3.87 - 5.11 Mil/uL   Platelets 319.0 150.0 - 400.0 K/uL   Hemoglobin 14.0 12.0 - 15.0 g/dL   HCT 41.5 36.0 - 46.0 %   MCV 92.5 78.0 - 100.0  fl   MCHC 33.7 30.0 - 36.0 g/dL   RDW 13.2 11.5 - 15.5 %  Folate     Status: None   Collection Time: 05/06/16 12:45 PM  Result Value Ref Range   Folate 17.7 >5.9 ng/mL  Lipase     Status: Abnormal   Collection Time: 05/06/16 12:45 PM  Result Value Ref Range   Lipase 6.0 (L) 11.0 - 59.0 U/L  CBC w/Diff     Status: None   Collection Time: 05/20/16 12:13 PM  Result Value Ref Range   WBC 6.1 4.0 - 10.5 K/uL   RBC 4.37 3.87 - 5.11 Mil/uL   Hemoglobin 13.6 12.0 - 15.0 g/dL   HCT 40.1 36.0 - 46.0 %   MCV 91.8 78.0 - 100.0 fl   MCHC 34.0 30.0 - 36.0 g/dL   RDW 13.0 11.5 - 15.5 %   Platelets 327.0 150.0 - 400.0 K/uL   Neutrophils Relative % 48.4 43.0 - 77.0 %   Lymphocytes Relative 40.7 12.0 - 46.0 %   Monocytes Relative  8.4 3.0 - 12.0 %   Eosinophils Relative 2.1 0.0 - 5.0 %   Basophils Relative 0.4 0.0 - 3.0 %   Neutro Abs 2.9 1.4 - 7.7 K/uL   Lymphs Abs 2.5 0.7 - 4.0 K/uL   Monocytes Absolute 0.5 0.1 - 1.0 K/uL   Eosinophils Absolute 0.1 0.0 - 0.7 K/uL   Basophils Absolute 0.0 0.0 - 0.1 K/uL  Comp Met (CMET)     Status: Abnormal   Collection Time: 05/20/16 12:13 PM  Result Value Ref Range   Sodium 140 135 - 145 mEq/L   Potassium 3.8 3.5 - 5.1 mEq/L   Chloride 104 96 - 112 mEq/L   CO2 30 19 - 32 mEq/L   Glucose, Bld 95 70 - 99 mg/dL   BUN 5 (L) 6 - 23 mg/dL   Creatinine, Ser 0.61 0.40 - 1.20 mg/dL   Total Bilirubin 0.4 0.2 - 1.2 mg/dL   Alkaline Phosphatase 97 39 - 117 U/L   AST 19 0 - 37 U/L   ALT 18 0 - 35 U/L   Total Protein 7.5 6.0 - 8.3 g/dL   Albumin 4.2 3.5 - 5.2 g/dL   Calcium 9.0 8.4 - 10.5 mg/dL   GFR 104.35 >60.00 mL/min  Lipase     Status: Abnormal   Collection Time: 05/20/16 12:13 PM  Result Value Ref Range   Lipase 4.0 (L) 11.0 - 59.0 U/L   Assessment/Plan: 1. Obstipation Patient endorses no BM or flatus in several days. Denies decreased appetite. Has continued to eat 3 meals per day. BS present. Bowel regimen given. Will check x-ray to r/o developing SBO. - DG Abd 2 Views; Future  2. LUQ pain Chronic with exacerbation. Discussed consistency with diet and medication. Will check lab assessment today. Discussed CT but patient refuses. Endorses she would like to follow-up with GI. Will start Carafate in case of gastritis. ER precautions reviewed.  - CBC w/Diff - Comp Met (CMET) - Lipase   Leeanne Rio, PA-C

## 2016-05-27 NOTE — Telephone Encounter (Signed)
Please assess status of CT order. Patient has not been contacted to schedule. Will you please reach out to her and let her know what the hold up is and help her get the imaging scheduled when possible. Thank you.

## 2016-05-28 MED ORDER — PREDNISONE 50 MG PO TABS: ORAL_TABLET | ORAL | 0 refills | Status: DC

## 2016-05-29 ENCOUNTER — Encounter: Payer: Self-pay | Admitting: Gynecology

## 2016-05-29 MED FILL — raNITIdine HCL 300 MG TABS: 300 | 90 days supply | Qty: 90 | Fill #1

## 2016-05-30 ENCOUNTER — Encounter (HOSPITAL_BASED_OUTPATIENT_CLINIC_OR_DEPARTMENT_OTHER): Payer: Self-pay

## 2016-05-30 ENCOUNTER — Ambulatory Visit (HOSPITAL_BASED_OUTPATIENT_CLINIC_OR_DEPARTMENT_OTHER)
Admission: RE | Admit: 2016-05-30 | Discharge: 2016-05-30 | Disposition: A | Discharge status: Home / Self Care | Payer: Commercial Managed Care - HMO | Source: Ambulatory Visit | Attending: Physician Assistant | Admitting: Physician Assistant

## 2016-05-30 DIAGNOSIS — D3502 Benign neoplasm of left adrenal gland: Secondary | ICD-10-CM | POA: Insufficient documentation

## 2016-05-30 DIAGNOSIS — I700 Atherosclerosis of aorta: Secondary | ICD-10-CM | POA: Diagnosis not present

## 2016-05-30 DIAGNOSIS — I701 Atherosclerosis of renal artery: Secondary | ICD-10-CM | POA: Insufficient documentation

## 2016-05-30 DIAGNOSIS — G8929 Other chronic pain: Secondary | ICD-10-CM | POA: Diagnosis not present

## 2016-05-30 DIAGNOSIS — K439 Ventral hernia without obstruction or gangrene: Secondary | ICD-10-CM | POA: Insufficient documentation

## 2016-05-30 DIAGNOSIS — R109 Unspecified abdominal pain: Secondary | ICD-10-CM | POA: Diagnosis not present

## 2016-05-30 DIAGNOSIS — R1010 Upper abdominal pain, unspecified: Secondary | ICD-10-CM | POA: Diagnosis not present

## 2016-05-30 HISTORY — DX: Malignant (primary) neoplasm, unspecified: C80.1

## 2016-05-30 MED ORDER — IOPAMIDOL (ISOVUE-300) INJECTION 61%
100.0000 mL | Freq: Once | INTRAVENOUS | Status: AC | PRN
Start: 2016-05-30 — End: 2016-05-30
  Administered 2016-05-30: 100 mL via INTRAVENOUS

## 2016-05-30 NOTE — Telephone Encounter (Signed)
Dr. Loetta Rough- She had Vit D level checked on 05/06/16 at [facility] and it was 35.44.

## 2016-05-30 NOTE — Telephone Encounter (Signed)
Vit D looked better. Stay on 2000 units daily

## 2016-06-03 ENCOUNTER — Encounter: Payer: Self-pay | Admitting: Family Medicine

## 2016-06-03 MED ORDER — ALPRAZOLAM 0.5 MG PO TABS: 0.5000 mg | ORAL_TABLET | Freq: Two times a day (BID) | ORAL | 0 refills | Status: DC | PRN

## 2016-06-03 NOTE — Telephone Encounter (Signed)
Rx of xanax printed. I will sign tomorrow. Pt can pick up or we can fax after I sign.

## 2016-06-09 ENCOUNTER — Encounter: Payer: Self-pay | Admitting: Physician Assistant

## 2016-06-10 ENCOUNTER — Other Ambulatory Visit: Payer: Self-pay | Admitting: Family Medicine

## 2016-06-10 MED ORDER — ALPRAZOLAM 2 MG PO TABS: 2.0000 mg | ORAL_TABLET | Freq: Two times a day (BID) | ORAL | 1 refills | Status: DC | PRN

## 2016-06-12 ENCOUNTER — Encounter: Payer: Self-pay | Admitting: Family Medicine

## 2016-06-12 ENCOUNTER — Telehealth: Payer: Self-pay | Admitting: Family Medicine

## 2016-06-12 ENCOUNTER — Other Ambulatory Visit: Payer: Self-pay | Admitting: Family Medicine

## 2016-06-12 ENCOUNTER — Ambulatory Visit (INDEPENDENT_AMBULATORY_CARE_PROVIDER_SITE_OTHER): Payer: Commercial Managed Care - HMO | Admitting: Family Medicine

## 2016-06-12 VITALS — BP 169/75 | HR 75 | Temp 98.4°F | Ht 66.0 in | Wt 161.2 lb

## 2016-06-12 DIAGNOSIS — I10 Essential (primary) hypertension: Secondary | ICD-10-CM

## 2016-06-12 DIAGNOSIS — I701 Atherosclerosis of renal artery: Secondary | ICD-10-CM | POA: Insufficient documentation

## 2016-06-12 DIAGNOSIS — R002 Palpitations: Secondary | ICD-10-CM

## 2016-06-12 DIAGNOSIS — R739 Hyperglycemia, unspecified: Secondary | ICD-10-CM

## 2016-06-12 DIAGNOSIS — E782 Mixed hyperlipidemia: Secondary | ICD-10-CM

## 2016-06-12 DIAGNOSIS — R300 Dysuria: Secondary | ICD-10-CM

## 2016-06-12 DIAGNOSIS — M25579 Pain in unspecified ankle and joints of unspecified foot: Secondary | ICD-10-CM

## 2016-06-12 DIAGNOSIS — R0789 Other chest pain: Secondary | ICD-10-CM

## 2016-06-12 DIAGNOSIS — F418 Other specified anxiety disorders: Secondary | ICD-10-CM

## 2016-06-12 DIAGNOSIS — I071 Rheumatic tricuspid insufficiency: Secondary | ICD-10-CM

## 2016-06-12 DIAGNOSIS — F341 Dysthymic disorder: Secondary | ICD-10-CM

## 2016-06-12 DIAGNOSIS — M810 Age-related osteoporosis without current pathological fracture: Secondary | ICD-10-CM

## 2016-06-12 DIAGNOSIS — I340 Nonrheumatic mitral (valve) insufficiency: Secondary | ICD-10-CM

## 2016-06-12 LAB — URINALYSIS, ROUTINE W REFLEX MICROSCOPIC
Bilirubin Urine: NEGATIVE
HGB URINE DIPSTICK: NEGATIVE
KETONES UR: NEGATIVE
LEUKOCYTES UA: NEGATIVE
Nitrite: NEGATIVE
Specific Gravity, Urine: 1.02 (ref 1.000–1.030)
TOTAL PROTEIN, URINE-UPE24: NEGATIVE
URINE GLUCOSE: NEGATIVE
Urobilinogen, UA: 0.2 (ref 0.0–1.0)
pH: 8.5 — AB (ref 5.0–8.0)

## 2016-06-12 MED ORDER — FLUOXETINE HCL 20 MG PO TABS: 10.0000 mg | ORAL_TABLET | Freq: Every day | ORAL | 2 refills | Status: DC

## 2016-06-12 MED ORDER — HYDROCODONE-ACETAMINOPHEN 10-325 MG PO TABS: 1.0000 | ORAL_TABLET | Freq: Three times a day (TID) | ORAL | 0 refills | Status: DC | PRN

## 2016-06-12 MED ORDER — NITROGLYCERIN 0.4 MG SL SUBL: 0.4000 mg | SUBLINGUAL_TABLET | SUBLINGUAL | 1 refills | Status: DC | PRN

## 2016-06-12 MED ORDER — CARVEDILOL 6.25 MG PO TABS: 6.2500 mg | ORAL_TABLET | Freq: Three times a day (TID) | ORAL | 3 refills | Status: DC

## 2016-06-12 NOTE — Telephone Encounter (Signed)
Relation to [id number] Call back number:[phone]   Reason for call:  Patient was seen today and stated PCP wanted her to return in 1 week for BP check with nurse, please advise

## 2016-06-12 NOTE — Patient Instructions (Signed)
Renal Artery Stenosis °Renal artery stenosis (RAS) is narrowing of the artery that carries blood to your kidneys. It can affect one or both kidneys. °Your kidneys filter waste and extra fluid from your blood. You get rid of the waste and fluid when you urinate. Your kidneys also make an important chemical messenger (hormone) called renin. Renin helps regulate your blood pressure. The first sign of RAS may be high blood pressure. Over time, other symptoms can develop. °CAUSES  °Plaque buildup in your arteries (atherosclerosis) is the main cause of RAS. The plaques that cause this are made up of: °· Fat. °· Cholesterol. °· Calcium. °· Other substances. °As these substances build up in your renal artery, this slows the blood supply to your kidneys. The lack of blood and oxygen causes the signs and symptoms of RAS. °A much less common cause of RAS is a disease called fibromuscular dysplasia. This disease causes abnormal cell growth that narrows the renal artery. It is not related to atherosclerosis. It occurs mostly in women who are 25-50 years old. It may be passed down through families. °RISK FACTORS  °You may be at risk for renal artery stenosis if you: °· Are a man who is at least 66 years old. °· Are a woman who is at least 66 years old. °· Have high blood pressure. °· Have high cholesterol. °· Are a smoker. °· Abuse alcohol. °· Have diabetes or prediabetes. °· Are overweight. °· Have a family history of early heart disease. °SIGNS AND SYMPTOMS  °RAS usually develops slowly. You may not have any signs or symptoms at first. The earliest signs may be: °· Developing high blood pressure. °· A sudden increase in existing high blood pressure. °· No longer responding to medicine that used to control your blood pressure. °Later signs and symptoms are due to kidney damage. They may include: °· Fatigue. °· Shortness of breath. °· Swollen legs and feet. °· Dry skin. °· Headaches. °· Muscle cramps. °· Loss of  appetite. °· Nausea or vomiting. °DIAGNOSIS  °Your health care provider may suspect RAS based on changes in your blood pressure and your risk factors. A physical exam will be done. Your health care provider may use a stethoscope to listen for a whooshing sound (bruit) that can occur where the renal artery is blocking blood flow. Several tests may be done to confirm a diagnosis of RAS. These may include: °· Blood and urine tests to check your kidney function. °· Imaging tests of your kidneys, such as: °¨ A test that involves using sound waves to create an image of your kidneys and the blood flow to your kidneys (ultrasound). °¨ A test in which dye is injected into one of your blood vessels so images can be taken as the dye flows through your renal arteries (angiogram). These tests can be done using X-rays, a CT scan (computed tomography angiogram, CTA), or a type of MRI (magnetic resonance angiogram, MRA). °TREATMENT  °Making lifestyle changes to reduce your risk factors is the first treatment option for early RAS. If the blood flow to one of your kidneys is cut by more than half, you may need medicine to: °· Lower your blood pressure. This is the main medical treatment for RAS. You may need more than one type of medicine for this. The two types that work best for RAS are: °¨ ACE inhibitors. °¨ Angiotensin receptor blockers. °· Reduce fluid in the body (diuretics). °· Lower your cholesterol (statins). °If medicine is not enough   to control RAS, you may need surgery. This may involve: °· Threading a tube with an inflatable balloon into the renal artery to force it open (angioplasty). °· Removing plaque from inside the artery (endarterectomy). °HOME CARE INSTRUCTIONS °· Take medicines only as directed by your health care provider. °· Make any lifestyle changes recommended by your health care provider. This may include: °¨ Working with a dietitian to maintain a heart-healthy diet. This type of diet is low in saturated  fat, salt, and added sugar. °¨ Starting an exercise program as directed by your health care provider. °¨ Maintaining a healthy weight. °¨ Quitting smoking. °¨ Not abusing alcohol. °· Keep all follow-up visits as directed by your health care provider. This is important. °SEEK MEDICAL CARE IF: °· Your symptoms of RAS are not getting better. °· Your symptoms are changing or getting worse. °SEEK IMMEDIATE MEDICAL CARE IF: °· You have very bad pain in your back or abdomen. °· You have blood in your urine. °  °This information is not intended to replace advice given to you by your health care provider. Make sure you discuss any questions you have with your health care provider. °  °Document Released: 04/30/2005 Document Revised: 08/25/2014 Document Reviewed: 11/17/2013 °Elsevier Interactive Patient Education ©2016 [employer] ° °

## 2016-06-12 NOTE — Telephone Encounter (Signed)
Yes schedule a bp check with RN next week.

## 2016-06-12 NOTE — Assessment & Plan Note (Signed)
Left side notable, found on recent CT exam referred to vascular surgeon for further evaluation.

## 2016-06-12 NOTE — Progress Notes (Signed)
Pre visit review using our clinic review tool, if applicable. No additional management support is needed unless otherwise documented below in the visit note. 

## 2016-06-12 NOTE — Assessment & Plan Note (Signed)
Also atherosclerosis, fatigue and atypical chest pain, referred back to cardiology

## 2016-06-13 MED FILL — HYDROCODON-APAP 10-325: 10-325 | 22 days supply | Qty: 65 | Fill #0

## 2016-06-13 NOTE — Telephone Encounter (Signed)
Called patient and left message to return call

## 2016-06-14 LAB — CULTURE, URINE COMPREHENSIVE
Colony Count: NO GROWTH
Organism ID, Bacteria: NO GROWTH

## 2016-06-15 MED ORDER — ASPIRIN EC 81 MG PO TBEC: 81.0000 mg | DELAYED_RELEASE_TABLET | Freq: Every day | ORAL | Status: DC

## 2016-06-15 NOTE — Assessment & Plan Note (Signed)
Encouraged heart healthy diet, increase exercise, avoid trans fats, consider a krill oil cap daily 

## 2016-06-15 NOTE — Assessment & Plan Note (Signed)
LVH and Tricuspid regurg now with some atypical chest pain, will refer to cardiology for further consideration. Start an 81 mg aspirin. Given SL NTG to use prn. sek care if no resolution after 3 doses.

## 2016-06-15 NOTE — Assessment & Plan Note (Addendum)
Not well controlled. Encouraged heart healthy diet such as the DASH diet and exercise as tolerated.

## 2016-06-15 NOTE — Assessment & Plan Note (Signed)
minimize simple carbs. Increase exercise as tolerated.  

## 2016-06-15 NOTE — Progress Notes (Addendum)
Patient ID: Megan [name], female   DOB: 1951-05-22, 65 y.o.   MRN: [id number]   Subjective:    Patient ID: Megan [name], female    DOB: [birth year], 65 y.o.   MRN: [id number]  Chief Complaint  Patient presents with  . Follow-up    HPI Patient is in today for follow up. She continues to struggle with high levels of stress, anxiety and anhedonia. No suicidal ideation but has not motivation. Did not tolerate Lexapro. Denies palp/SOB/HA/congestion/fevers/GI or GU c/o. Taking meds as prescribed. Does endorse some intermittent chest discomfort without associated symptoms no consistent pattern of flares.   Past Medical History:  Diagnosis Date  . 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 Allergic state 12/31/2012  . Anxiety   . Asthma   . Barrett esophagus   . Blind loop syndrome   . C. difficile diarrhea   . Cancer ([place])   . Chest pain, atypical 07/14/2011  . Colon polyp   . Contact dermatitis 03/23/2012  . Cystocele, midline   . Depression   . Diarrhea   . Diverticulosis   . Endometrial hyperplasia 02/27/2006   BENIGN ENDO BX ON 02/2007  . Family history of malignant neoplasm of gastrointestinal tract    multiple members  . GERD (gastroesophageal reflux disease)   . Headache 04/16/2015  . Headache(784.0) 04/15/2012  . Hematuria 04/27/2012  . Hepatic cyst   . Hiatal hernia   . Hiatal hernia   . Hyperlipidemia, mixed 12/17/2014  . Hypertension   . IBS (irritable bowel syndrome)   . Insomnia   . Leaky heart valve   . Low back pain 08/15/2013  . Osteoporosis 05/2011   T score-2.5 Lt femoral neck 2012, T score -2.4 2016 FRAX 12%/2.4%  . 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 Paronychia of great toe, left 06/19/2013  . Preventative health care 09/11/2014  . Rectal bleeding 10/10/2011  . Rectocele   . 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 Sinusitis acute 02/03/2012  . Thrush 07/21/2011  . 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 Unspecified hypothyroidism   .  Unspecified menopausal and postmenopausal disorder   . Uterine prolapse without mention of vaginal wall prolapse   . Vitamin B12 deficiency     Past Surgical History:  Procedure Laterality Date  . APPENDECTOMY    . HYSTEROSCOPY     POLYP  . UTERINE FIBROID SURGERY      Family History  Problem Relation Age of Onset  . Colon cancer Mother   . Diabetes Mother   . Hypertension Mother   . Colon polyps Father   . Parkinson's disease Father   . Colon cancer Maternal Grandmother   . Breast cancer Maternal Grandmother 33  . Diabetes Paternal Grandmother   . Breast cancer Paternal Grandmother 97  . Allergies Sister   . Arthritis Brother     b/l hip replace  . Alcohol abuse Daughter   . Arthritis Son   . Hypertension Son   . Allergies Son   . Osteoporosis Son   . Osteoporosis Son   . Arthritis Son     back disease, DDD  . Allergies Son   . Arthritis Son   . Irritable bowel syndrome Son     RSD  . Hypertension Paternal Grandfather   . Heart disease Paternal Grandfather   . Aneurysm Paternal Grandfather   . Colon cancer Maternal Aunt   . Colon cancer Cousin     X3  . Ovarian cancer Cousin  Social History   Social History  . Marital status: Married    Spouse name: N/A  . Number of children: 4  . Years of education: N/A   Occupational History  . Retired    Social History Main Topics  . Smoking status: Former Smoker    Quit date: 08/19/1995  . Smokeless tobacco: Never Used  . Alcohol use No  . Drug use: No  . Sexual activity: Not Currently    Birth control/ protection: Post-menopausal     Comment: 1st intercourse 4 yo-5 partners   Other Topics Concern  . Not on file   Social History Narrative  . No narrative on file    Outpatient Medications Prior to Visit  Medication Sig Dispense Refill  . albuterol (VENTOLIN HFA) 108 (90 Base) MCG/ACT inhaler Inhale 2 puffs into the lungs every 6 (six) hours as needed for wheezing or shortness of breath. 18 g 5  .  ALL DAY ALLERGY 10 MG tablet Take 1 tablet by mouth daily as needed.   3  . alprazolam (XANAX) 2 MG tablet Take 1 tablet (2 mg total) by mouth 2 (two) times daily as needed for anxiety. 60 tablet 1  . Artificial Tear Ointment (DRY EYES OP) Place 1 drop into both eyes daily as needed (for dry eyes).    Marland Kitchen azelastine (ASTELIN) 0.1 % nasal spray Place 2 sprays into both nostrils 2 (two) times daily. Use in each nostril as directed 30 mL 12  . CARTIA XT 180 MG 24 hr capsule TAKE ONE CAPSULE BY MOUTH DAILY 90 capsule 0  . cyclobenzaprine (FLEXERIL) 10 MG tablet Take 1 tablet (10 mg total) by mouth 3 (three) times daily as needed for muscle spasms. 40 tablet 1  . diphenhydramine-acetaminophen (TYLENOL PM) 25-500 MG TABS tablet Take 1 tablet by mouth at bedtime as needed (for sleep).    . fexofenadine (ALLEGRA ALLERGY) 180 MG tablet Take 1 tablet (180 mg total) by mouth 2 (two) times daily as needed for allergies or rhinitis.    . fluticasone (FLONASE) 50 MCG/ACT nasal spray PLACE 2 SPRAYS INTO BOTH NOSTRILS DAILY 16 g 6  . hyoscyamine (LEVBID) 0.375 MG 12 hr tablet Take 1 tablet (0.375 mg total) by mouth 3 (three) times daily as needed. 90 tablet 1  . lactobacillus acidophilus (BACID) TABS tablet Take 2 tablets by mouth 3 (three) times daily.    . montelukast (SINGULAIR) 10 MG tablet Take 1 tablet (10 mg total) by mouth at bedtime. (Patient taking differently: Take 10 mg by mouth at bedtime as needed (for asthma symptoms). ) 30 tablet 2  . pantoprazole (PROTONIX) 40 MG tablet Take 1 tablet (40 mg total) by mouth daily. 30 tablet 3  . ranitidine (ZANTAC) 300 MG tablet TAKE 1 TABLET BY MOUTH AT BEDTIME 90 tablet 1  . sucralfate (CARAFATE) 1 g tablet TAKE 1 TABLET(1 GRAM) BY MOUTH FOUR TIMES DAILY AT BEDTIME WITH MEALS 368 tablet 1  . traZODone (DESYREL) 50 MG tablet Take 0.5-1 tablets (25-50 mg total) by mouth at bedtime as needed for sleep. 30 tablet 1  . zolpidem (AMBIEN) 5 MG tablet Take 1 tablet (5 mg  total) by mouth at bedtime as needed for sleep. 15 tablet 1  . aspirin 81 MG tablet Take 81 mg by mouth daily with supper.     . carvedilol (COREG) 6.25 MG tablet Take 1 tablet (6.25 mg total) by mouth 2 (two) times daily. 180 tablet 3  . HYDROcodone-acetaminophen (NORCO) 10-325 MG  tablet Take 1 tablet by mouth every 8 (eight) hours as needed. 65 tablet 0  . predniSONE (DELTASONE) 50 MG tablet Take 1 tablet (50 mg) by mouth as directed at each time-- 13 hours before CT scan, 7 hours before CT scan and 1 hour before CT scan 3 tablet 0   No facility-administered medications prior to visit.     Allergies  Allergen Reactions  . Sulfamethoxazole Shortness Of Breath    chest tightness  . Amitriptyline Anxiety and Other (See Comments)    Depression, insomnia  . Effexor [Venlafaxine]     palpitations  . Iohexol Other (See Comments)    Passed out  . Lactose Intolerance (Gi) Diarrhea and Nausea And Vomiting  . Lexapro [Escitalopram Oxalate]     nausea  . Iodinated Diagnostic Agents     Pt states at Dr.Grapeys office 15 years ago blacked out for 2 hours during injection for IVP. Was told never to have IV dye again.     Review of Systems  Constitutional: Positive for malaise/fatigue. Negative for fever.  HENT: Negative for congestion.   Eyes: Negative for blurred vision.  Respiratory: Negative for shortness of breath.   Cardiovascular: Positive for chest pain. Negative for palpitations and leg swelling.  Gastrointestinal: Negative for abdominal pain, blood in stool and nausea.  Genitourinary: Negative for dysuria and frequency.  Musculoskeletal: Negative for falls.  Skin: Negative for rash.  Neurological: Negative for dizziness, loss of consciousness and headaches.  Endo/Heme/Allergies: Negative for environmental allergies.  Psychiatric/Behavioral: Positive for depression. The patient is nervous/anxious.        Objective:    Physical Exam  Constitutional: She is oriented to person,  place, and time. She appears well-developed and well-nourished. No distress.  HENT:  Head: Atraumatic.  Nose: Nose normal.  Eyes: Right eye exhibits no discharge. Left eye exhibits no discharge.  Neck: Normal range of motion. Neck supple.  Cardiovascular: Normal rate and regular rhythm.   No murmur heard. Pulmonary/Chest: Effort normal and breath sounds normal.  Abdominal: Soft. Bowel sounds are normal. There is no tenderness.  Musculoskeletal: She exhibits no edema.  Neurological: She is alert and oriented to person, place, and time.  Skin: Skin is warm and dry.  Psychiatric: She has a normal mood and affect.  Nursing note and vitals reviewed.   BP (!) 169/75 (BP Location: Right Arm, Patient Position: Sitting, Cuff Size: Normal)   Pulse 75   Temp 98.4 F (36.9 C) (Oral)   Ht 5\' 6"  (1.676 m)   Wt 161 lb 4 oz (73.1 kg)   SpO2 99%   BMI 26.03 kg/m  Wt Readings from Last 3 Encounters:  06/12/16 161 lb 4 oz (73.1 kg)  05/20/16 162 lb 4 oz (73.6 kg)  05/06/16 159 lb 8 oz (72.3 kg)     Lab Results  Component Value Date   WBC 6.1 05/20/2016   HGB 13.6 05/20/2016   HCT 40.1 05/20/2016   PLT 327.0 05/20/2016   GLUCOSE 95 05/20/2016   CHOL 215 (H) 04/16/2015   TRIG 134.0 04/16/2015   HDL 45.70 04/16/2015   LDLDIRECT 165.0 06/30/2011   LDLCALC 142 (H) 04/16/2015   ALT 18 05/20/2016   AST 19 05/20/2016   NA 140 05/20/2016   K 3.8 05/20/2016   CL 104 05/20/2016   CREATININE 0.61 05/20/2016   BUN 5 (L) 05/20/2016   CO2 30 05/20/2016   TSH 0.86 05/06/2016   INR 1.06 01/14/2016   HGBA1C 5.7 04/16/2015  Lab Results  Component Value Date   TSH 0.86 05/06/2016   Lab Results  Component Value Date   WBC 6.1 05/20/2016   HGB 13.6 05/20/2016   HCT 40.1 05/20/2016   MCV 91.8 05/20/2016   PLT 327.0 05/20/2016   Lab Results  Component Value Date   NA 140 05/20/2016   K 3.8 05/20/2016   CO2 30 05/20/2016   GLUCOSE 95 05/20/2016   BUN 5 (L) 05/20/2016   CREATININE  0.61 05/20/2016   BILITOT 0.4 05/20/2016   ALKPHOS 97 05/20/2016   AST 19 05/20/2016   ALT 18 05/20/2016   PROT 7.5 05/20/2016   ALBUMIN 4.2 05/20/2016   CALCIUM 9.0 05/20/2016   ANIONGAP 6 01/14/2016   GFR 104.35 05/20/2016   Lab Results  Component Value Date   CHOL 215 (H) 04/16/2015   Lab Results  Component Value Date   HDL 45.70 04/16/2015   Lab Results  Component Value Date   LDLCALC 142 (H) 04/16/2015   Lab Results  Component Value Date   TRIG 134.0 04/16/2015   Lab Results  Component Value Date   CHOLHDL 5 04/16/2015   Lab Results  Component Value Date   HGBA1C 5.7 04/16/2015       Assessment & Plan:   Problem List Items Addressed This Visit    ANXIETY DEPRESSION    Did not tolerate Lexpro, will try Fluoxetine      Relevant Medications   FLUoxetine (PROZAC) 20 MG tablet   Essential hypertension    Not well controlled. Encouraged heart healthy diet such as the DASH diet and exercise as tolerated.       Relevant Medications   carvedilol (COREG) 6.25 MG tablet   aspirin EC 81 MG tablet   Other Relevant Orders   Ambulatory referral to Cardiology   Hyperglycemia    minimize simple carbs. Increase exercise as tolerated.      Tricuspid regurgitation    Also atherosclerosis, fatigue and atypical chest pain, referred back to cardiology      Relevant Medications   carvedilol (COREG) 6.25 MG tablet   aspirin EC 81 MG tablet   Other Relevant Orders   Ambulatory referral to Cardiology   Mitral regurgitation    LVH and Tricuspid regurg now with some atypical chest pain, will refer to cardiology for further consideration. Start an 81 mg aspirin. Given SL NTG to use prn. sek care if no resolution after 3 doses.       Relevant Medications   carvedilol (COREG) 6.25 MG tablet   aspirin EC 81 MG tablet   Osteoporosis    Encouraged to get adequate exercise, calcium and vitamin d intake      Hyperlipidemia, mixed    Encouraged heart healthy diet,  increase exercise, avoid trans fats, consider a krill oil cap daily      Relevant Medications   carvedilol (COREG) 6.25 MG tablet   aspirin EC 81 MG tablet   Atypical chest pain - Primary   Relevant Orders   Ambulatory referral to Cardiology   EKG 12-Lead (Completed)   Renal artery stenosis (HCC)    Left side notable, found on recent CT exam referred to vascular surgeon for further evaluation.      Relevant Medications   carvedilol (COREG) 6.25 MG tablet   aspirin EC 81 MG tablet   Other Relevant Orders   Ambulatory referral to Vascular Surgery   Ambulatory referral to Cardiology    Other Visit Diagnoses    Pain  in joint, ankle and foot, unspecified laterality       Relevant Medications   HYDROcodone-acetaminophen (NORCO) 10-325 MG tablet   Palpitations       Relevant Medications   carvedilol (COREG) 6.25 MG tablet   Other Relevant Orders   Ambulatory referral to Cardiology   EKG 12-Lead (Completed)   Depression with anxiety       Relevant Medications   FLUoxetine (PROZAC) 20 MG tablet   Dysuria       Relevant Orders   Urinalysis, Routine w reflex microscopic (not at [facility]) (Completed)   CULTURE, URINE COMPREHENSIVE (Completed)      I have discontinued Ms. Cervenka's aspirin and predniSONE. I have also changed her carvedilol. Additionally, I am having her start on FLUoxetine and aspirin EC. Lastly, I am having her maintain her lactobacillus acidophilus, cyclobenzaprine, ranitidine, montelukast, diphenhydramine-acetaminophen, Artificial Tear Ointment (DRY EYES OP), fexofenadine, traZODone, fluticasone, hyoscyamine, ALL DAY ALLERGY, albuterol, azelastine, pantoprazole, zolpidem, CARTIA XT, sucralfate, alprazolam, and HYDROcodone-acetaminophen.  Meds ordered this encounter  Medications  . HYDROcodone-acetaminophen (NORCO) 10-325 MG tablet    Sig: Take 1 tablet by mouth every 8 (eight) hours as needed.    Dispense:  65 tablet    Refill:  0  . carvedilol (COREG) 6.25 MG  tablet    Sig: Take 1 tablet (6.25 mg total) by mouth 3 (three) times daily.    Dispense:  180 tablet    Refill:  3  . DISCONTD: nitroGLYCERIN (NITROSTAT) 0.4 MG SL tablet    Sig: Place 1 tablet (0.4 mg total) under the tongue every 5 (five) minutes as needed for chest pain.    Dispense:  25 tablet    Refill:  1  . FLUoxetine (PROZAC) 20 MG tablet    Sig: Take 0.5-1 tablets (10-20 mg total) by mouth daily.    Dispense:  30 tablet    Refill:  2  . aspirin EC 81 MG tablet    Sig: Take 1 tablet (81 mg total) by mouth daily.     Penni Homans, MD

## 2016-06-15 NOTE — Addendum Note (Signed)
Addended by: Penni Homans A on: 06/15/2016 11:33 PM   Modules accepted: Orders

## 2016-06-15 NOTE — Assessment & Plan Note (Signed)
Did not tolerate Lexpro, will try Fluoxetine

## 2016-06-15 NOTE — Assessment & Plan Note (Signed)
Encouraged to get adequate exercise, calcium and vitamin d intake 

## 2016-06-15 NOTE — Progress Notes (Signed)
Cardiology Office Note    Date:  06/16/2016   ID:  Megan [name], Megan [name] Dec 25, 1950, MRN [id number]  PCP:  Penni Homans, MD  Cardiologist:  Dr. Burt Knack  Chief Complaint: Chest pain  History of Present Illness:   Megan [name] is a 65 y.o. female moderate tricuspid regurgitation, palpitations, chest pain, Barrett's Esoghagus, HTN, renal artery stenosis who presented for evaluation of chest pain.   She has had a stress echocardiogram that was normal.  Last echocardiogram in 05/2015 showed normal LV size and function, moderate and stable tricuspid regurgitation, and an estimated RV systolic pressure of 41 mmHg.  48 hours holter monitor 09/2015 showed PACs, PVCS, pSVT (short runs). No sustained arrhythmias or pathologic pauses.   Seen by PCP 06/12/16 and complain of chest pain and fatigue and referred to [place] for further evaluation. Increased coreg to 6.25mg  TID for elevated BP. Recent abdominal pelvis CT showed aoritc atherosclerosis and marked narrowing at the orginin of the left renal artery.   Here with multiple complains. For the past 2-3 months she has been having chest pain underneath L breast near epigastric area. She describes the pain as "some one punching/tighntesss" that radiates to L side and then down. This been intermittent with exacerbating and alleviating factor. Also having exertional dyspnea and fatigue that relives with rest. Rest HR 60-90s at day times. At night she feels palpitation. Denies orthopnea, PND, syncope. BP fluctuates between 110/65 to 160/80.    Past Medical History:  Diagnosis Date  . 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 Allergic state 12/31/2012  . Anxiety   . Asthma   . Barrett esophagus   . Blind loop syndrome   . C. difficile diarrhea   . Cancer ([place])   . Chest pain, atypical 07/14/2011  . Colon polyp   . Contact dermatitis 03/23/2012  . Cystocele, midline   . Depression   . Diarrhea   . Diverticulosis     . Endometrial hyperplasia 02/27/2006   BENIGN ENDO BX ON 02/2007  . Family history of malignant neoplasm of gastrointestinal tract    multiple members  . GERD (gastroesophageal reflux disease)   . Headache 04/16/2015  . Headache(784.0) 04/15/2012  . Hematuria 04/27/2012  . Hepatic cyst   . Hiatal hernia   . Hiatal hernia   . Hyperlipidemia, mixed 12/17/2014  . Hypertension   . IBS (irritable bowel syndrome)   . Insomnia   . Leaky heart valve   . Low back pain 08/15/2013  . Osteoporosis 05/2011   T score-2.5 Lt femoral neck 2012, T score -2.4 2016 FRAX 12%/2.4%  . 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 Paronychia of great toe, left 06/19/2013  . Preventative health care 09/11/2014  . Rectal bleeding 10/10/2011  . Rectocele   . 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 Sinusitis acute 02/03/2012  . Thrush 07/21/2011  . 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 Unspecified hypothyroidism   . Unspecified menopausal and postmenopausal disorder   . Uterine prolapse without mention of vaginal wall prolapse   . Vitamin B12 deficiency     Past Surgical History:  Procedure Laterality Date  . APPENDECTOMY    . HYSTEROSCOPY     POLYP  . UTERINE FIBROID SURGERY      Current Medications: Prior to Admission medications   Medication Sig Start Date End Date Taking? Authorizing Provider  albuterol (VENTOLIN HFA) 108 (90 Base) MCG/ACT inhaler Inhale 2 puffs into the lungs every 6 (six) hours as needed  for wheezing or shortness of breath. 02/20/16   [name], MD  ALL DAY ALLERGY 10 MG tablet Take 1 tablet by mouth daily as needed.  12/14/15   Historical Provider, MD  alprazolam Duanne Moron) 2 MG tablet Take 1 tablet (2 mg total) by mouth 2 (two) times daily as needed for anxiety. 06/10/16   [name], MD  Artificial Tear Ointment (DRY EYES OP) Place 1 drop into both eyes daily as needed (for dry eyes).    Historical Provider, MD  azelastine (ASTELIN) 0.1 % nasal spray Place 2 sprays into both nostrils 2 (two)  times daily. Use in each nostril as directed 03/18/16   [name], MD  CARTIA XT 180 MG 24 hr capsule TAKE ONE CAPSULE BY MOUTH DAILY 05/13/16   [name], MD  carvedilol (COREG) 6.25 MG tablet Take 1 tablet (6.25 mg total) by mouth 3 (three) times daily. 06/12/16   [name], MD  cyclobenzaprine (FLEXERIL) 10 MG tablet Take 1 tablet (10 mg total) by mouth 3 (three) times daily as needed for muscle spasms. 07/22/15   [name], MD  diphenhydramine-acetaminophen (TYLENOL PM) 25-500 MG TABS tablet Take 1 tablet by mouth at bedtime as needed (for sleep).    Historical Provider, MD  fexofenadine (ALLEGRA ALLERGY) 180 MG tablet Take 1 tablet (180 mg total) by mouth 2 (two) times daily as needed for allergies or rhinitis. 01/18/16   [name], MD  FLUoxetine (PROZAC) 20 MG tablet Take 0.5-1 tablets (10-20 mg total) by mouth daily. 06/12/16   [name], MD  fluticasone (FLONASE) 50 MCG/ACT nasal spray PLACE 2 SPRAYS INTO BOTH NOSTRILS DAILY 02/05/16   [name], MD  HYDROcodone-acetaminophen (NORCO) 10-325 MG tablet Take 1 tablet by mouth every 8 (eight) hours as needed. 06/12/16   [name], MD  hyoscyamine (LEVBID) 0.375 MG 12 hr tablet Take 1 tablet (0.375 mg total) by mouth 3 (three) times daily as needed. 02/14/16   [name], MD  lactobacillus acidophilus (BACID) TABS tablet Take 2 tablets by mouth 3 (three) times daily.    Historical Provider, MD  montelukast (SINGULAIR) 10 MG tablet Take 1 tablet (10 mg total) by mouth at bedtime. Patient taking differently: Take 10 mg by mouth at bedtime as needed (for asthma symptoms).  12/14/15   [name], MD  nitroGLYCERIN (NITROSTAT) 0.4 MG SL tablet DISSOLVE 1 TABLET UNDER TONGUE EVERY 5 MINUTES AS NEEDED FOR CHEST PAIN 06/12/16   [name], MD  pantoprazole (PROTONIX) 40 MG tablet Take 1 tablet (40 mg total) by mouth daily. 05/06/16   Brunetta Jeans, PA-C  ranitidine (ZANTAC) 300 MG tablet TAKE 1 TABLET BY MOUTH  AT BEDTIME 10/26/15   [name], MD  sucralfate (CARAFATE) 1 g tablet TAKE 1 TABLET(1 GRAM) BY MOUTH FOUR TIMES DAILY AT BEDTIME WITH MEALS 05/21/16   [name], MD  traZODone (DESYREL) 50 MG tablet Take 0.5-1 tablets (25-50 mg total) by mouth at bedtime as needed for sleep. 01/18/16   [name], MD  zolpidem (AMBIEN) 5 MG tablet Take 1 tablet (5 mg total) by mouth at bedtime as needed for sleep. 05/06/16   Brunetta Jeans, PA-C    Allergies:   Iodine-131; Sulfamethoxazole; Amitriptyline; Effexor [venlafaxine]; Iohexol; Lactose intolerance (gi); Lexapro [escitalopram oxalate]; and Iodinated diagnostic agents   Social History   Social History  . Marital status: Married    Spouse name: N/A  .  Number of children: 4  . Years of education: N/A   Occupational History  . Retired    Social History Main Topics  . Smoking status: Former Smoker    Quit date: 08/19/1995  . Smokeless tobacco: Never Used  . Alcohol use No  . Drug use: No  . Sexual activity: Not Currently    Birth control/ protection: Post-menopausal     Comment: 1st intercourse 89 yo-5 partners   Other Topics Concern  . None   Social History Narrative  . None     Family History:  The patient's family history includes Alcohol abuse in her daughter; Allergies in her sister, son, and son; Aneurysm in her paternal grandfather; Arthritis in her brother, son, son, and son; Breast cancer (age of onset: 69) in her paternal grandmother; Breast cancer (age of onset: 37) in her maternal grandmother; Colon cancer in her cousin, maternal aunt, maternal grandmother, and mother; Colon polyps in her father; Diabetes in her mother and paternal grandmother; Heart disease in her paternal grandfather; Hypertension in her mother, paternal grandfather, and son; Irritable bowel syndrome in her son; Osteoporosis in her son and son; Ovarian cancer in her cousin; Parkinson's disease in her father.   ROS:   Please see the history of present  illness.    ROS All other systems reviewed and are negative.   PHYSICAL EXAM:   VS:  BP 128/74   Pulse 60   Ht 5\' 6"  (1.676 m)   Wt 162 lb 1.9 oz (73.5 kg)   BMI 26.17 kg/m    GEN: Well nourished, well developed, in no acute distress  HEENT: normal  Neck: no JVD, carotid bruits, or masses Cardiac: RRR; 2/6  Murmur,  rubs, or gallops,no edema. TTP at left lowest cartilage beside xiphoid process Respiratory:  clear to auscultation bilaterally, normal work of breathing GI: soft, nontender, nondistended, + BS MS: no deformity or atrophy  Skin: warm and dry, no rash Neuro:  Alert and Oriented x 3, Strength and sensation are intact Psych: euthymic mood, full affect  Wt Readings from Last 3 Encounters:  06/16/16 162 lb 1.9 oz (73.5 kg)  06/12/16 161 lb 4 oz (73.1 kg)  05/20/16 162 lb 4 oz (73.6 kg)      Studies/Labs Reviewed:   EKG:  EKG is ordered today.  The ekg ordered today demonstrates sinus rhythm at rate of 60 bpm, Incomplete RBBB. No acute changes  Recent Labs: 05/06/2016: TSH 0.86 05/20/2016: ALT 18; BUN 5; Creatinine, Ser 0.61; Hemoglobin 13.6; Platelets 327.0; Potassium 3.8; Sodium 140   Lipid Panel    Component Value Date/Time   CHOL 215 (H) 04/16/2015 0919   TRIG 134.0 04/16/2015 0919   HDL 45.70 04/16/2015 0919   CHOLHDL 5 04/16/2015 0919   VLDL 26.8 04/16/2015 0919   LDLCALC 142 (H) 04/16/2015 0919   LDLDIRECT 165.0 06/30/2011 1153    Additional studies/ records that were reviewed today include:   Cardiac Studies Reviewed: 2D Echo June 11, 2015: Study Conclusions  - Left ventricle: The cavity size was normal. Wall thickness was increased in a pattern of mild LVH. Systolic function was normal. The estimated ejection fraction was in the range of 60% to 65%. Wall motion was normal; there were no regional wall motion abnormalities. Left ventricular diastolic function parameters were normal. - Mitral valve: Mildly thickened leaflets . There  was trivial regurgitation. - Left atrium: The atrium was normal in size. - Tricuspid valve: There was moderate regurgitation. - Pulmonary arteries: The  main pulmonary artery is dilated. PA peak pressure: 41 mm Hg (S). - Inferior vena cava: The vessel was normal in size. The respirophasic diameter changes were in the normal range (= 50%), consistent with normal central venous pressure.  Impressions:  - Compared to the prior echo in 2015, the degree of TR is moderate. RVSP is 41 mmHg, otherwise, no significant changes.  Renal artery Duplex 06-15-2015: IMPRESSION: 1. No evidence of hemodynamically significant stenosis in the proximal or mid renal arteries bilaterally. 2. There is questionable significance elevation of the peak systolic velocity in the more distal left renal artery in the region of the hilum. However, the vessels are often tortuous in this anatomic region and this velocity elevation may be exaggerated. 3. Normal sonographic appearance of the kidneys, no renal size discrepancy, hydronephrosis or nephrolithiasis.   CT of abdomen Pelvis 05/30/16 IMPRESSION: No bowel obstruction. No bowel wall or mesenteric thickening. No abscess. Appendix absent.  No renal or ureteral calculi.  No hydronephrosis.  Aortic atherosclerosis. Marked narrowing at the origin of the left renal artery. Question whether patient is hypertensive given the degree of renal artery stenosis on the left.  Stable small left adrenal adenoma.  Minimal ventral hernia containing only fat.   ASSESSMENT & PLAN:    1. Chest pain - Seems atypical. Reproducible with palpitation underneath L breast. Can not take NSAID due to severe GI issue, schedule for EGD at [facility] 17. Given recent dyspnea and fatigue with exertion, will get exercise Myoview (pt prefers as husband had severe reaction to chemical). Discussed her BP might get really high during test and she is ok to try. If unable  switch to chemical. Hold CCB prior to stress test. PRN SL nitro prescribed by PCP.   2. Moderate tricuspid regurgitation  - On echo 05/2015 with PA pressure of 96mm HG. Will update echo given recent dyspnea.   3. Palpitations - 48 hours holter monitor 09/2015 showed PACs, PVCS, pSVT (short runs). No sustained arrhythmias or pathologic pauses. This time her palpitations is different and worse at night. No associated symptoms. Will change Cartia XT to 120mg  BID. She will let us know if no improvement. TSH was normal 05/06/16.   4. Hypertension - Difficult to control. Running low to high normal. Continue Coreg 6.25mg  TID as prescribed by PCP. Cartia XT as above. BP stable in clinic today.  5. Left renal artery stenosis - Recent abdominal pelvis CT showed aoritc atherosclerosis and marked narrowing at the orginin of the left renal artery.  Managed by PCP --> plan to refer to vascular surgeon per patient.    Medication Adjustments/Labs and Tests Ordered: Current medicines are reviewed at length with the patient today.  Concerns regarding medicines are outlined above.  Medication changes, Labs and Tests ordered today are listed in the Patient Instructions below. Patient Instructions  Medication Instructions:  Your physician has recommended you make the following change in your medication:  1-Change Cardizem 120 mg by mouth twice daily  Labwork: NONE  Testing/Procedures: Your physician has requested that you have an echocardiogram. Echocardiography is a painless test that uses sound waves to create images of your heart. It provides your doctor with information about the size and shape of your heart and how well your heart's chambers and valves are working. This procedure takes approximately one hour. There are no restrictions for this procedure.  Your physician has requested that you have en exercise stress myoview. For further information please visit [email or web address]. Please follow  instruction  sheet, as given.  Follow-Up: Your physician wants you to follow-up in: 2 months with Dr. Burt Knack.    If you need a refill on your cardiac medications before your next appointment, please call your pharmacy.       Jarrett Soho, [place]  06/16/2016 10:19 AM    [facility] [address], [place], [place]  [address] Phone: [phone]; Fax: [phone]

## 2016-06-16 ENCOUNTER — Encounter: Payer: Self-pay | Admitting: Physician Assistant

## 2016-06-16 ENCOUNTER — Ambulatory Visit (INDEPENDENT_AMBULATORY_CARE_PROVIDER_SITE_OTHER): Payer: Commercial Managed Care - HMO | Admitting: Physician Assistant

## 2016-06-16 VITALS — BP 128/74 | HR 60 | Ht 66.0 in | Wt 162.1 lb

## 2016-06-16 DIAGNOSIS — I071 Rheumatic tricuspid insufficiency: Secondary | ICD-10-CM

## 2016-06-16 DIAGNOSIS — R002 Palpitations: Secondary | ICD-10-CM | POA: Diagnosis not present

## 2016-06-16 DIAGNOSIS — I701 Atherosclerosis of renal artery: Secondary | ICD-10-CM

## 2016-06-16 DIAGNOSIS — I10 Essential (primary) hypertension: Secondary | ICD-10-CM

## 2016-06-16 DIAGNOSIS — R079 Chest pain, unspecified: Secondary | ICD-10-CM | POA: Diagnosis not present

## 2016-06-16 MED ORDER — DILTIAZEM HCL ER COATED BEADS 120 MG PO CP24: 120.0000 mg | ORAL_CAPSULE | Freq: Two times a day (BID) | ORAL | 3 refills | Status: DC

## 2016-06-16 NOTE — Patient Instructions (Addendum)
Medication Instructions:  Your physician has recommended you make the following change in your medication:  1-Change Cardizem 120 mg by mouth twice daily  Labwork: NONE  Testing/Procedures: Your physician has requested that you have an echocardiogram. Echocardiography is a painless test that uses sound waves to create images of your heart. It provides your doctor with information about the size and shape of your heart and how well your heart's chambers and valves are working. This procedure takes approximately one hour. There are no restrictions for this procedure.  Your physician has requested that you have en exercise stress myoview. For further information please visit [email or web address]. Please follow instruction sheet, as given.  Follow-Up: Your physician wants you to follow-up in: 2 months with Dr. Burt Knack.    If you need a refill on your cardiac medications before your next appointment, please call your pharmacy.

## 2016-06-18 NOTE — Telephone Encounter (Signed)
Appointment scheduled as directed. 

## 2016-06-19 ENCOUNTER — Encounter: Payer: Self-pay | Admitting: Family Medicine

## 2016-06-19 ENCOUNTER — Other Ambulatory Visit: Payer: Self-pay | Admitting: Family Medicine

## 2016-06-19 ENCOUNTER — Other Ambulatory Visit: Payer: Self-pay | Admitting: *Deleted

## 2016-06-19 DIAGNOSIS — R1013 Epigastric pain: Principal | ICD-10-CM

## 2016-06-19 DIAGNOSIS — R002 Palpitations: Secondary | ICD-10-CM

## 2016-06-19 DIAGNOSIS — G8929 Other chronic pain: Secondary | ICD-10-CM

## 2016-06-19 DIAGNOSIS — I10 Essential (primary) hypertension: Secondary | ICD-10-CM

## 2016-06-19 MED ORDER — RANITIDINE HCL 300 MG PO TABS: 300.0000 mg | ORAL_TABLET | Freq: Every day | ORAL | 1 refills | Status: DC

## 2016-06-19 MED ORDER — PANTOPRAZOLE SODIUM 40 MG PO TBEC: 40.0000 mg | DELAYED_RELEASE_TABLET | Freq: Every day | ORAL | 1 refills | Status: DC

## 2016-06-19 MED ORDER — CARVEDILOL 6.25 MG PO TABS: 6.2500 mg | ORAL_TABLET | Freq: Three times a day (TID) | ORAL | 3 refills | Status: DC

## 2016-06-19 MED ORDER — DILTIAZEM HCL ER COATED BEADS 120 MG PO CP24: 120.0000 mg | ORAL_CAPSULE | Freq: Two times a day (BID) | ORAL | 3 refills | Status: DC

## 2016-06-19 MED ORDER — FLUTICASONE PROPIONATE 50 MCG/ACT NA SUSP: 2.0000 | Freq: Every day | NASAL | 3 refills | Status: DC

## 2016-06-19 MED ORDER — SUCRALFATE 1 G PO TABS: 1.0000 g | ORAL_TABLET | Freq: Three times a day (TID) | ORAL | 0 refills | Status: DC

## 2016-06-19 NOTE — Telephone Encounter (Signed)
Request a 90 day refill

## 2016-06-19 NOTE — Telephone Encounter (Signed)
She can have a 90 day refill with 1 rf on the Trazadone but not the other 2. Can have 30 with 5 rf on the others

## 2016-06-20 ENCOUNTER — Other Ambulatory Visit: Payer: Self-pay

## 2016-06-20 ENCOUNTER — Ambulatory Visit (INDEPENDENT_AMBULATORY_CARE_PROVIDER_SITE_OTHER): Payer: Commercial Managed Care - HMO | Admitting: Physician Assistant

## 2016-06-20 VITALS — BP 140/71 | HR 71

## 2016-06-20 DIAGNOSIS — I10 Essential (primary) hypertension: Secondary | ICD-10-CM

## 2016-06-20 MED ORDER — ALPRAZOLAM 2 MG PO TABS: 2.0000 mg | ORAL_TABLET | Freq: Two times a day (BID) | ORAL | 5 refills | Status: DC | PRN

## 2016-06-20 MED ORDER — ZOLPIDEM TARTRATE 5 MG PO TABS: 5.0000 mg | ORAL_TABLET | Freq: Every evening | ORAL | 5 refills | Status: DC | PRN

## 2016-06-20 MED ORDER — TRAZODONE HCL 50 MG PO TABS: 25.0000 mg | ORAL_TABLET | Freq: Every evening | ORAL | 1 refills | Status: DC | PRN

## 2016-06-20 NOTE — Progress Notes (Signed)
Pre visit review using our clinic tool,if applicable. No additional management support is needed unless otherwise documented below in the visit note.   Patient in for BP check. States she is still taking Cardizem 180 mg. Has not switched to120 mg will switch when she uses up 180 mg per patient.  BP today = 140/61 P=71  Per Raiford Noble PA patient to continue current medication regimen and follow up in 3-4 weeks with Dr. Charlett Blake. Patient agreed

## 2016-06-20 NOTE — Telephone Encounter (Signed)
Called left message to call back 

## 2016-06-20 NOTE — Telephone Encounter (Signed)
Sent in Trazodone as instructed to mail order. Printed zolpidem and alprazolam as instructed and on counter for signature Called the patient to ask where to fax alprazolam and zolpidem

## 2016-06-20 NOTE — Progress Notes (Signed)
RN note reviewed. Plan is as stated.  Leeanne Rio, PA-C

## 2016-06-23 ENCOUNTER — Other Ambulatory Visit: Payer: Self-pay | Admitting: Family Medicine

## 2016-06-23 MED FILL — ZOLPIDEM TARTRATE 5 MG TAB: 5 | 30 days supply | Qty: 30 | Fill #0

## 2016-06-23 NOTE — Telephone Encounter (Signed)
Faxed alprazolam and zolpidem to [place].

## 2016-06-25 ENCOUNTER — Encounter: Payer: Self-pay | Admitting: Family Medicine

## 2016-06-27 ENCOUNTER — Telehealth: Payer: Self-pay | Admitting: Family Medicine

## 2016-06-27 NOTE — Telephone Encounter (Signed)
Patient informed. 

## 2016-06-27 NOTE — Telephone Encounter (Signed)
Relation to [id number] Call back number:[phone]   Reason for call:  Patient had a root canel and the dentist was conflicted between penicillin 500mg  or clindamycin 150mg . Please advise

## 2016-06-27 NOTE — Telephone Encounter (Signed)
With her history would stick with PCN

## 2016-07-03 ENCOUNTER — Telehealth (HOSPITAL_COMMUNITY): Payer: Self-pay | Admitting: *Deleted

## 2016-07-03 NOTE — Telephone Encounter (Signed)
Left message on voicemail per DPR in reference to upcoming appointment scheduled on 07/08/16 with detailed instructions given per Myocardial Perfusion Study Information Sheet for the test. LM to arrive 15 minutes early, and that it is imperative to arrive on time for appointment to keep from having the test rescheduled. If you need to cancel or reschedule your appointment, please call the office within 24 hours of your appointment. Failure to do so may result in a cancellation of your appointment, and a $50 no show fee. Phone number given for call back for any questions.

## 2016-07-04 ENCOUNTER — Other Ambulatory Visit (HOSPITAL_COMMUNITY): Payer: Commercial Managed Care - HMO

## 2016-07-04 ENCOUNTER — Encounter (HOSPITAL_COMMUNITY): Payer: Commercial Managed Care - HMO

## 2016-07-08 ENCOUNTER — Telehealth (HOSPITAL_COMMUNITY): Payer: Self-pay

## 2016-07-08 NOTE — Telephone Encounter (Signed)
Patient given detailed instructions per Myocardial Perfusion Study Information Sheet for the test on 07/09/16 at 1100. Patient notified to arrive 15 minutes early and that it is imperative to arrive on time for appointment to keep from having the test rescheduled.  If you need to cancel or reschedule your appointment, please call the office within 24 hours of your appointment. Failure to do so may result in a cancellation of your appointment, and a $50 no show fee. Patient verbalized understanding.T. Lanay Zinda, CNMT, RT-N

## 2016-07-09 ENCOUNTER — Other Ambulatory Visit: Payer: Self-pay

## 2016-07-09 ENCOUNTER — Ambulatory Visit (HOSPITAL_BASED_OUTPATIENT_CLINIC_OR_DEPARTMENT_OTHER): Payer: Commercial Managed Care - HMO

## 2016-07-09 ENCOUNTER — Ambulatory Visit (HOSPITAL_COMMUNITY): Payer: Commercial Managed Care - HMO | Attending: Cardiology

## 2016-07-09 ENCOUNTER — Encounter: Payer: Self-pay | Admitting: Vascular Surgery

## 2016-07-09 DIAGNOSIS — R079 Chest pain, unspecified: Secondary | ICD-10-CM | POA: Insufficient documentation

## 2016-07-09 DIAGNOSIS — R002 Palpitations: Secondary | ICD-10-CM | POA: Diagnosis not present

## 2016-07-09 DIAGNOSIS — I10 Essential (primary) hypertension: Secondary | ICD-10-CM | POA: Diagnosis not present

## 2016-07-09 DIAGNOSIS — R0609 Other forms of dyspnea: Secondary | ICD-10-CM | POA: Diagnosis not present

## 2016-07-09 LAB — MYOCARDIAL PERFUSION IMAGING
CSEPEDS: 30 s
Estimated workload: 6.4 METS
Exercise duration (min): 4 min
LV dias vol: 56 mL (ref 46–106)
LVSYSVOL: 9 mL
MPHR: 155 {beats}/min
NUC STRESS TID: 0.95
Peak HR: 151 {beats}/min
Percent HR: 97 %
RATE: 0.35
Rest HR: 75 {beats}/min
SDS: 3
SRS: 4
SSS: 7

## 2016-07-09 MED ORDER — TECHNETIUM TC 99M TETROFOSMIN IV KIT
11.0000 | PACK | Freq: Once | INTRAVENOUS | Status: AC | PRN
  Administered 2016-07-09: 11 via INTRAVENOUS
  Filled 2016-07-09: qty 11

## 2016-07-09 MED ORDER — TECHNETIUM TC 99M TETROFOSMIN IV KIT
32.5000 | PACK | Freq: Once | INTRAVENOUS | Status: AC | PRN
  Administered 2016-07-09: 32.5 via INTRAVENOUS
  Filled 2016-07-09: qty 33

## 2016-07-15 ENCOUNTER — Telehealth: Payer: Self-pay | Admitting: *Deleted

## 2016-07-15 DIAGNOSIS — R002 Palpitations: Secondary | ICD-10-CM

## 2016-07-15 DIAGNOSIS — I10 Essential (primary) hypertension: Secondary | ICD-10-CM

## 2016-07-15 MED ORDER — CARVEDILOL 6.25 MG PO TABS: 6.2500 mg | ORAL_TABLET | Freq: Two times a day (BID) | ORAL | 3 refills | Status: DC

## 2016-07-15 MED ORDER — DILTIAZEM HCL ER COATED BEADS 180 MG PO CP24: 180.0000 mg | ORAL_CAPSULE | Freq: Every day | ORAL | 3 refills | Status: DC

## 2016-07-15 NOTE — Telephone Encounter (Signed)
Continue BID dose of coreg. Yes she can take 180mg  of cardizem.

## 2016-07-15 NOTE — Telephone Encounter (Signed)
Lm for pt to let her know that Vin Bhagat, PA-C was ok with her staying on Cardizem 180 qd and decreasing the Coreg to bid.  Refill of Cardizem sent to [facility], [place].

## 2016-07-15 NOTE — Telephone Encounter (Signed)
Called pt to give her some test results.  She states that if she takes Coreg bid, her bp is good, but if she takes it tid, her bp goes to low, so she would rather take it only bid.  Also, on the Cardizem, she didn't feel comfortable switching to 120 bid so she stayed on the 180 qd.  Is that ok?  If so, she will need a refill on the 180 mg Cardizem.  Please advise!

## 2016-07-16 ENCOUNTER — Encounter: Payer: Self-pay | Admitting: Vascular Surgery

## 2016-07-16 ENCOUNTER — Other Ambulatory Visit: Payer: Self-pay

## 2016-07-16 ENCOUNTER — Ambulatory Visit (INDEPENDENT_AMBULATORY_CARE_PROVIDER_SITE_OTHER): Payer: Commercial Managed Care - HMO | Admitting: Vascular Surgery

## 2016-07-16 VITALS — BP 128/68 | HR 62 | Temp 98.0°F | Resp 16 | Ht 66.0 in | Wt 162.0 lb

## 2016-07-16 DIAGNOSIS — I701 Atherosclerosis of renal artery: Secondary | ICD-10-CM

## 2016-07-16 DIAGNOSIS — I700 Atherosclerosis of aorta: Secondary | ICD-10-CM

## 2016-07-16 NOTE — Progress Notes (Signed)
Referred by:  [name], MD [address] [place], [place] [address]   Reason for referral: Left renal artery stenosis    History of Present Illness  Megan [name] is a 65 y.o. (06/12/1951) female who presents with cc: abnormal CT.  This patient had undergone a CT abd/pelvis with contrast for abdominal pain.  The patient had not had a bowel movement in 2-3 weeks.  Incidentally on that CT, a high grade stenosis of the L renal artery had been found.  The patient notes no changes in a her regular urinary habits.  She has never had any oliguria or anuria.  She denies any problems with HTN, rather she has had some episodes of lower BP.  She denies any flank complaints.   Past Medical History:  Diagnosis Date  . 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 Allergic state 12/31/2012  . Anxiety   . Asthma   . Barrett esophagus   . Blind loop syndrome   . C. difficile diarrhea   . Cancer ([facility])   . Chest pain, atypical 07/14/2011  . Colon polyp   . Contact dermatitis 03/23/2012  . Cystocele, midline   . Depression   . Diarrhea   . Diverticulosis   . Endometrial hyperplasia 02/27/2006   BENIGN ENDO BX ON 02/2007  . Family history of malignant neoplasm of gastrointestinal tract    multiple members  . GERD (gastroesophageal reflux disease)   . Headache 04/16/2015  . Headache(784.0) 04/15/2012  . Hematuria 04/27/2012  . Hepatic cyst   . Hiatal hernia   . Hiatal hernia   . Hyperlipidemia, mixed 12/17/2014  . Hypertension   . IBS (irritable bowel syndrome)   . Insomnia   . Leaky heart valve   . Low back pain 08/15/2013  . Osteoporosis 05/2011   T score-2.5 Lt femoral neck 2012, T score -2.4 2016 FRAX 12%/2.4%  . 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 Paronychia of great toe, left 06/19/2013  . Preventative health care 09/11/2014  . Rectal bleeding 10/10/2011  . Rectocele   . 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 Sinusitis acute 02/03/2012  . Thrush  07/21/2011  . 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 Unspecified hypothyroidism   . Unspecified menopausal and postmenopausal disorder   . Uterine prolapse without mention of vaginal wall prolapse   . Vitamin B12 deficiency     Past Surgical History:  Procedure Laterality Date  . APPENDECTOMY    . HYSTEROSCOPY     POLYP  . UTERINE FIBROID SURGERY      Social History   Social History  . Marital status: Married    Spouse name: N/A  . Number of children: 4  . Years of education: N/A   Occupational History  . Retired    Social History Main Topics  . Smoking status: Former Smoker    Quit date: 08/19/1995  . Smokeless tobacco: Never Used  . Alcohol use No  . Drug use: No  . Sexual activity: Not Currently    Birth control/ protection: Post-menopausal     Comment: 1st intercourse 52 yo-5 partners   Other Topics Concern  . Not on file   Social History Narrative  . No narrative on file    Family History  Problem Relation Age of Onset  . Colon cancer Mother   . Diabetes Mother   . Hypertension Mother   . Colon polyps Father   . Parkinson's disease Father   .  Colon cancer Maternal Grandmother   . Breast cancer Maternal Grandmother 23  . Diabetes Paternal Grandmother   . Breast cancer Paternal Grandmother 55  . Allergies Sister   . Arthritis Brother     b/l hip replace  . Alcohol abuse Daughter   . Arthritis Son   . Hypertension Son   . Allergies Son   . Osteoporosis Son   . Osteoporosis Son   . Arthritis Son     back disease, DDD  . Allergies Son   . Arthritis Son   . Irritable bowel syndrome Son     RSD  . Hypertension Paternal Grandfather   . Heart disease Paternal Grandfather   . Aneurysm Paternal Grandfather   . Colon cancer Maternal Aunt   . Colon cancer Cousin     X3  . Ovarian cancer Cousin     Current Outpatient Prescriptions  Medication Sig Dispense Refill  . albuterol (VENTOLIN HFA) 108 (90 Base) MCG/ACT inhaler Inhale 2 puffs into the lungs  every 6 (six) hours as needed for wheezing or shortness of breath. 18 g 5  . ALL DAY ALLERGY 10 MG tablet Take 1 tablet by mouth daily as needed.   3  . alprazolam (XANAX) 2 MG tablet Take 1 tablet (2 mg total) by mouth 2 (two) times daily as needed for anxiety. 60 tablet 5  . Artificial Tear Ointment (DRY EYES OP) Place 1 drop into both eyes daily as needed (for dry eyes).    Marland Kitchen aspirin EC 81 MG tablet Take 1 tablet (81 mg total) by mouth daily.    Marland Kitchen azelastine (ASTELIN) 0.1 % nasal spray Place 2 sprays into both nostrils 2 (two) times daily. Use in each nostril as directed 30 mL 12  . carvedilol (COREG) 6.25 MG tablet Take 1 tablet (6.25 mg total) by mouth 2 (two) times daily with a meal. 270 tablet 3  . cyclobenzaprine (FLEXERIL) 10 MG tablet Take 1 tablet (10 mg total) by mouth 3 (three) times daily as needed for muscle spasms. 40 tablet 1  . diltiazem (CARDIZEM CD) 180 MG 24 hr capsule Take 1 capsule (180 mg total) by mouth daily. 90 capsule 3  . diphenhydramine-acetaminophen (TYLENOL PM) 25-500 MG TABS tablet Take 1 tablet by mouth at bedtime as needed (for sleep).    . fexofenadine (ALLEGRA ALLERGY) 180 MG tablet Take 1 tablet (180 mg total) by mouth 2 (two) times daily as needed for allergies or rhinitis.    Marland Kitchen FLUoxetine (PROZAC) 20 MG tablet Take 0.5-1 tablets (10-20 mg total) by mouth daily. 30 tablet 2  . fluticasone (FLONASE) 50 MCG/ACT nasal spray Place 2 sprays into both nostrils daily. 48 g 3  . HYDROcodone-acetaminophen (NORCO) 10-325 MG tablet Take 1 tablet by mouth every 8 (eight) hours as needed. 65 tablet 0  . hyoscyamine (LEVBID) 0.375 MG 12 hr tablet Take 1 tablet (0.375 mg total) by mouth 3 (three) times daily as needed. 90 tablet 1  . lactobacillus acidophilus (BACID) TABS tablet Take 2 tablets by mouth 3 (three) times daily.    . montelukast (SINGULAIR) 10 MG tablet Take 10 mg by mouth at bedtime.    . nitroGLYCERIN (NITROSTAT) 0.4 MG SL tablet DISSOLVE 1 TABLET UNDER TONGUE  EVERY 5 MINUTES AS NEEDED FOR CHEST PAIN 300 tablet 1  . pantoprazole (PROTONIX) 40 MG tablet Take 1 tablet (40 mg total) by mouth daily. 90 tablet 1  . ranitidine (ZANTAC) 300 MG tablet Take 1 tablet (300 mg total)  by mouth at bedtime. 90 tablet 1  . sucralfate (CARAFATE) 1 g tablet Take 1 tablet (1 g total) by mouth 4 (four) times daily -  with meals and at bedtime. 368 tablet 0  . traZODone (DESYREL) 50 MG tablet Take 0.5-1 tablets (25-50 mg total) by mouth at bedtime as needed for sleep. 90 tablet 1  . zolpidem (AMBIEN) 5 MG tablet Take 1 tablet (5 mg total) by mouth at bedtime as needed for sleep. 30 tablet 5   No current facility-administered medications for this visit.     Allergies  Allergen Reactions  . Iodine-131 Other (See Comments)    Other reaction(s): Dizziness (intolerance)  . Sulfamethoxazole Shortness Of Breath    chest tightness  . Amitriptyline Anxiety and Other (See Comments)    Depression, insomnia  . Effexor [Venlafaxine]     palpitations  . Iohexol Other (See Comments)    Passed out  . Lactose Intolerance (Gi) Diarrhea and Nausea And Vomiting  . Lexapro [Escitalopram Oxalate]     nausea  . Iodinated Diagnostic Agents     Pt states at Dr.Grapeys office 15 years ago blacked out for 2 hours during injection for IVP. Was told never to have IV dye again.      REVIEW OF SYSTEMS:   Cardiac:  positive for: no symptoms, negative for: Chest pain or chest pressure, Shortness of breath upon exertion and Shortness of breath when lying flat,   Vascular:  positive for: no symptoms,  negative for: Pain in calf, thigh, or hip brought on by ambulation, Pain in feet at night that wakes you up from your sleep, Blood clot in your veins and Leg swelling  Pulmonary:  positive for: no symptoms,  negative for: Oxygen at home, Productive cough and Wheezing  Neurologic:  positive for: No symptoms, negative for: Sudden weakness in arms or legs, Sudden numbness in arms or  legs, Sudden onset of difficulty speaking or slurred speech, Temporary loss of vision in one eye and Problems with dizziness  Gastrointestinal:  positive for: no symptoms, negative for: Blood in stool and Vomited blood  Genitourinary:  positive for: no symptoms, negative for: Burning when urinating and Blood in urine  Psychiatric:  positive for: no symptoms,  negative for: Major depression  Hematologic:  positive for: no symptoms,  negative for: negative for: Bleeding problems and Problems with blood clotting too easily  Dermatologic:  positive for: no symptoms, negative for: Rashes or ulcers  Constitutional:  positive for: no symptoms, negative for: Fever or chills  Ear/Nose/Throat:  positive for: no symptoms, negative for: Change in hearing, Nose bleeds and Sore throat  Musculoskeletal:  positive for: no symptoms, negative for: Back pain, Joint pain and Muscle pain   For VQI Use Only  PRE-ADM LIVING: Home  AMB STATUS: Ambulatory  CAD Sx: None  PRIOR CHF: None  STRESS TEST: No   Physical Examination  Vitals:   07/16/16 0925  BP: 128/68  Pulse: 62  Resp: 16  Temp: 98 F (36.7 C)  TempSrc: Oral  SpO2: 98%  Weight: 162 lb (73.5 kg)  Height: 5\' 6"  (1.676 m)    Body mass index is 26.15 kg/m.  General: Alert, O x 3, WD,NAD  Head: [place]/AT,   Ear/Nose/Throat: Hearing grossly intact, nares without erythema or drainage, oropharynx without Erythema or Exudate , Mallampati score: 3, Dentition intact  Eyes: PERRLA, EOMI,   Neck: Supple, mid-line trachea,    Pulmonary: Sym exp, good B air movt,CTA B  Cardiac:  RRR, Nl S1, S2, no Murmurs, No rubs, No S3,S4  Vascular: Vessel Right Left  Radial Palpable Palpable  Brachial Palpable Palpable  Carotid Palpable, No Bruit Palpable, No Bruit  Aorta Not palpable N/A  Femoral Palpable Palpable  Popliteal Not palpable Not palpable  PT Palpable Palpable  DP Palpable Palpable   Gastrointestinal: soft,  non-distended, non-tender to palpation, No guarding or rebound, no HSM, no masses, no CVAT B, No palpable prominent aortic pulse,  no flank bruits  Musculoskeletal: M/S 5/5 throughout  , Extremities without ischemic changes  Neurologic: CN 2-12 intact , Pain and light touch intact in extremities , Motor exam as listed above  Psychiatric: Judgement intact, Mood & affect appropriate for pt's clinical situation  Dermatologic: See M/S exam for extremity exam, No rashes otherwise noted  Lymph : Palpable lymph nodes: None  CT abd/pelvis with contrast (05/30/16):  No bowel obstruction. No bowel wall or mesenteric thickening. No abscess. Appendix absent.  No renal or ureteral calculi.  No hydronephrosis.  Aortic atherosclerosis. Marked narrowing at the origin of the left renal artery. Question whether patient is hypertensive given the degree of renal artery stenosis on the left.  Stable small left adrenal adenoma.  Minimal ventral hernia containing only fat.  I have reviewed this patient's CTA, there is scatter calcific aortic stenosis with a calcific plaque extending from just distal to the SMA take off into the left renal arterial orifice.  Stenosis of this artery is >90%.   Outside Studies/Documentation 10 pages of outside documents were reviewed including: outpatient cardiology and PCP charts.   Medical Decision Making  Megan [name] is a 65 y.o. female who presents with: asx L renal artery stenosis >90%, aortic atherosclerosis   Due to the geometry of the plaque extending into the L proximal renal artery, exact degree of stenosis cannot be clearly calculated.   There is enough, however, to be concerned that the L kidney is at risk for ischemia without intervention.  Despite the ASTRAL and CORAL trials, I offer L RA stenting in cases of potential renal compromise as the patient is likely to lose the kidney without intervention.  Based on the patient's vascular studies and  examination, I have offered the patient: Aortogram, L renal angiography and possible intervention. I discussed with the patient the nature of angiographic procedures, especially the limited patencies of any endovascular intervention.   The patient is aware of that the risks of an angiographic procedure include but are not limited to: bleeding, infection, access site complications, renal failure, embolization, rupture of vessel, dissection, arteriovenous fistula, possible need for emergent surgical intervention, possible need for surgical procedures to treat the patient's pathology, anaphylactic reaction to contrast, and stroke and death.   The patient is aware of the risks and agrees to proceed.  She is scheduled for 7 DEC 17.  I discussed in depth with the patient the nature of atherosclerosis, and emphasized the importance of maximal medical management including strict control of blood pressure, blood glucose, and lipid levels, obtaining regular exercise, antiplatelet agents, and cessation of smoking.   The patient is currently not on a statin as not medically indicated. The patient is currently on an anti-platelet: ASA.  The patient is aware that without maximal medical management the underlying atherosclerotic disease process will progress, limiting the benefit of any interventions.  Thank you for allowing [place] to participate in this patient's care.   Adele Barthel, MD, FACS [facility] Office: [phone] Pager: [phone]  07/16/2016, 8:34 AM

## 2016-07-17 ENCOUNTER — Encounter: Payer: Self-pay | Admitting: Family Medicine

## 2016-07-17 ENCOUNTER — Ambulatory Visit (INDEPENDENT_AMBULATORY_CARE_PROVIDER_SITE_OTHER): Payer: Commercial Managed Care - HMO | Admitting: Family Medicine

## 2016-07-17 VITALS — BP 130/64 | HR 71 | Temp 97.6°F | Wt 163.8 lb

## 2016-07-17 DIAGNOSIS — E559 Vitamin D deficiency, unspecified: Secondary | ICD-10-CM

## 2016-07-17 DIAGNOSIS — R739 Hyperglycemia, unspecified: Secondary | ICD-10-CM | POA: Diagnosis not present

## 2016-07-17 DIAGNOSIS — E782 Mixed hyperlipidemia: Secondary | ICD-10-CM

## 2016-07-17 DIAGNOSIS — E538 Deficiency of other specified B group vitamins: Secondary | ICD-10-CM | POA: Diagnosis not present

## 2016-07-17 DIAGNOSIS — B001 Herpesviral vesicular dermatitis: Secondary | ICD-10-CM

## 2016-07-17 DIAGNOSIS — M810 Age-related osteoporosis without current pathological fracture: Secondary | ICD-10-CM | POA: Diagnosis not present

## 2016-07-17 DIAGNOSIS — F341 Dysthymic disorder: Secondary | ICD-10-CM

## 2016-07-17 DIAGNOSIS — I701 Atherosclerosis of renal artery: Secondary | ICD-10-CM | POA: Diagnosis not present

## 2016-07-17 DIAGNOSIS — I10 Essential (primary) hypertension: Secondary | ICD-10-CM

## 2016-07-17 HISTORY — DX: Herpesviral vesicular dermatitis: B00.1

## 2016-07-17 HISTORY — DX: Vitamin D deficiency, unspecified: E55.9

## 2016-07-17 MED ORDER — ACYCLOVIR 400 MG PO TABS: ORAL_TABLET | ORAL | 0 refills | Status: DC

## 2016-07-17 MED FILL — ACYCLOVIR 400 MG TABLET: 400 | 7 days supply | Qty: 21 | Fill #0

## 2016-07-17 NOTE — Patient Instructions (Addendum)
After surgery try Zinc, Krill Oil and Biotin for the hair loss   Dental Abscess A dental abscess is a collection of pus in or around a tooth. What are the causes? This condition is caused by a bacterial infection around the root of the tooth that involves the inner part of the tooth (pulp). It may result from:  Severe tooth decay.  Trauma to the tooth that allows bacteria to enter into the pulp, such as a broken or chipped tooth.  Severe gum disease around a tooth. What are the signs or symptoms? Symptoms of this condition include:  Severe pain in and around the infected tooth.  Swelling and redness around the infected tooth, in the mouth, or in the face.  Tenderness.  Pus drainage.  Bad breath.  Bitter taste in the mouth.  Difficulty swallowing.  Difficulty opening the mouth.  Nausea.  Vomiting.  Chills.  Swollen neck glands.  Fever. How is this diagnosed? This condition is diagnosed with examination of the infected tooth. During the exam, your dentist may tap on the infected tooth. Your dentist will also ask about your medical and dental history and may order X-rays. How is this treated? This condition is treated by eliminating the infection. This may be done with:  Antibiotic medicine.  A root canal. This may be performed to save the tooth.  Pulling (extracting) the tooth. This may also involve draining the abscess. This is done if the tooth cannot be saved. Follow these instructions at home:  Take medicines only as directed by your dentist.  If you were prescribed antibiotic medicine, finish all of it even if you start to feel better.  Rinse your mouth (gargle) often with salt water to relieve pain or swelling.  Do not drive or operate heavy machinery while taking pain medicine.  Do not apply heat to the outside of your mouth.  Keep all follow-up visits as directed by your dentist. This is important. Contact a health care provider if:  Your pain  is worse and is not helped by medicine. Get help right away if:  You have a fever or chills.  Your symptoms suddenly get worse.  You have a very bad headache.  You have problems breathing or swallowing.  You have trouble opening your mouth.  You have swelling in your neck or around your eye. This information is not intended to replace advice given to you by your health care provider. Make sure you discuss any questions you have with your health care provider. Document Released: 08/04/2005 Document Revised: 12/13/2015 Document Reviewed: 08/01/2014 Elsevier Interactive Patient Education  2017 [employer].

## 2016-07-17 NOTE — Progress Notes (Signed)
Pre visit review using our clinic review tool, if applicable. No additional management support is needed unless otherwise documented below in the visit note. 

## 2016-07-17 NOTE — Assessment & Plan Note (Signed)
Check vitamin D today check a level

## 2016-07-17 NOTE — Assessment & Plan Note (Signed)
Has been evaluated by vascular surgery and they have set her up to have a stent placed to protect her kidney.

## 2016-07-17 NOTE — Assessment & Plan Note (Signed)
Acyclovir 400 mg tabs 1 tab po tid

## 2016-07-18 LAB — LIPID PANEL
CHOLESTEROL: 218 mg/dL — AB (ref 0–200)
HDL: 49.3 mg/dL (ref >39.00)
LDL CALC: 146 mg/dL — AB (ref 0–99)
NonHDL: 169.01
TRIGLYCERIDES: 115 mg/dL (ref 0.0–149.0)
Total CHOL/HDL Ratio: 4
VLDL: 23 mg/dL (ref 0.0–40.0)

## 2016-07-18 LAB — CBC
HCT: 40.3 % (ref 36.0–46.0)
Hemoglobin: 13.5 g/dL (ref 12.0–15.0)
MCHC: 33.4 g/dL (ref 30.0–36.0)
MCV: 93.4 fl (ref 78.0–100.0)
PLATELETS: 320 10*3/uL (ref 150.0–400.0)
RBC: 4.31 Mil/uL (ref 3.87–5.11)
RDW: 12.7 % (ref 11.5–15.5)
WBC: 8.2 10*3/uL (ref 4.0–10.5)

## 2016-07-18 LAB — HEMOGLOBIN A1C: HEMOGLOBIN A1C: 5.6 % (ref 4.6–6.5)

## 2016-07-18 LAB — VITAMIN B12: VITAMIN B 12: 393 pg/mL (ref 211–911)

## 2016-07-18 LAB — COMPREHENSIVE METABOLIC PANEL
ALBUMIN: 4.2 g/dL (ref 3.5–5.2)
ALK PHOS: 100 U/L (ref 39–117)
ALT: 14 U/L (ref 0–35)
AST: 15 U/L (ref 0–37)
BILIRUBIN TOTAL: 0.3 mg/dL (ref 0.2–1.2)
BUN: 8 mg/dL (ref 6–23)
CALCIUM: 9.6 mg/dL (ref 8.4–10.5)
CO2: 29 mEq/L (ref 19–32)
CREATININE: 0.67 mg/dL (ref 0.40–1.20)
Chloride: 103 mEq/L (ref 96–112)
GFR: 93.59 mL/min (ref >60.00)
Glucose, Bld: 89 mg/dL (ref 70–99)
Potassium: 4.3 mEq/L (ref 3.5–5.1)
Sodium: 141 mEq/L (ref 135–145)
TOTAL PROTEIN: 7.4 g/dL (ref 6.0–8.3)

## 2016-07-18 LAB — VITAMIN D 25 HYDROXY (VIT D DEFICIENCY, FRACTURES): VITD: 31.72 ng/mL (ref 30.00–100.00)

## 2016-07-20 NOTE — Assessment & Plan Note (Signed)
Very stressed about upcoming vascular procedure but managing well with current meds no changes

## 2016-07-20 NOTE — Assessment & Plan Note (Signed)
Improved today with recent medication adjustments but patient aware of importance of having her renal artery procedure completed.

## 2016-07-20 NOTE — Assessment & Plan Note (Signed)
Level wnl, continue supplements

## 2016-07-20 NOTE — Assessment & Plan Note (Signed)
minimize simple carbs. Increase exercise as tolerated.  

## 2016-07-20 NOTE — Progress Notes (Signed)
Patient ID: Megan [name], female   DOB: [birth year], 65 y.o.   MRN: [id number]   Subjective:    Patient ID: Megan [name], female    DOB: 1950/10/24, 65 y.o.   MRN: [id number]  Chief Complaint  Patient presents with  . Follow-up    HPI Patient is in today for follow up. She has a renal artery stent placement scheduled for 12/7 and is very apprehensive to have this done. No acute concerns today. No hospitalization or acute illness. Denies CP/palp/SOB/HA/congestion/fevers/GI or GU c/o. Taking meds as prescribed. Endorses anhedonia and high anxiety but no suicidal ideation. Agrees to flu shot today  Past Medical History:  Diagnosis Date  . 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 Allergic state 12/31/2012  . Anxiety   . Asthma   . Barrett esophagus   . Blind loop syndrome   . C. difficile diarrhea   . Cancer ([place])   . Chest pain, atypical 07/14/2011  . Cold sore 07/17/2016  . Colon polyp   . Contact dermatitis 03/23/2012  . Cystocele, midline   . Depression   . Diarrhea   . Diverticulosis   . Endometrial hyperplasia 02/27/2006   BENIGN ENDO BX ON 02/2007  . Family history of malignant neoplasm of gastrointestinal tract    multiple members  . GERD (gastroesophageal reflux disease)   . Headache 04/16/2015  . Headache(784.0) 04/15/2012  . Hematuria 04/27/2012  . Hepatic cyst   . Hiatal hernia   . Hiatal hernia   . Hyperlipidemia, mixed 12/17/2014  . Hypertension   . IBS (irritable bowel syndrome)   . Insomnia   . Leaky heart valve   . Low back pain 08/15/2013  . Osteoporosis 05/2011   T score-2.5 Lt femoral neck 2012, T score -2.4 2016 FRAX 12%/2.4%  . 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 Paronychia of great toe, left 06/19/2013  . Preventative health care 09/11/2014  . Rectal bleeding 10/10/2011  . Rectocele   . 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 Sinusitis acute 02/03/2012  . Thrush 07/21/2011  . 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 Unspecified  hypothyroidism   . Unspecified menopausal and postmenopausal disorder   . Uterine prolapse without mention of vaginal wall prolapse   . Vitamin B12 deficiency   . Vitamin D deficiency 07/17/2016    Past Surgical History:  Procedure Laterality Date  . APPENDECTOMY    . HYSTEROSCOPY     POLYP  . UTERINE FIBROID SURGERY      Family History  Problem Relation Age of Onset  . Colon cancer Mother   . Diabetes Mother   . Hypertension Mother   . Colon polyps Father   . Parkinson's disease Father   . Colon cancer Maternal Grandmother   . Breast cancer Maternal Grandmother 26  . Diabetes Paternal Grandmother   . Breast cancer Paternal Grandmother 63  . Allergies Sister   . Arthritis Brother     b/l hip replace  . Alcohol abuse Daughter   . Arthritis Son   . Hypertension Son   . Allergies Son   . Osteoporosis Son   . Osteoporosis Son   . Arthritis Son     back disease, DDD  . Allergies Son   . Arthritis Son   . Irritable bowel syndrome Son     RSD  . Hypertension Paternal Grandfather   . Heart disease Paternal Grandfather   . Aneurysm Paternal Grandfather   . Colon cancer Maternal Aunt   .  Colon cancer Cousin     X3  . Ovarian cancer Cousin     Social History   Social History  . Marital status: Married    Spouse name: N/A  . Number of children: 4  . Years of education: N/A   Occupational History  . Retired    Social History Main Topics  . Smoking status: Former Smoker    Quit date: 08/19/1995  . Smokeless tobacco: Never Used  . Alcohol use No  . Drug use: No  . Sexual activity: Not Currently    Birth control/ protection: Post-menopausal     Comment: 1st intercourse 56 yo-5 partners   Other Topics Concern  . Not on file   Social History Narrative  . No narrative on file    Outpatient Medications Prior to Visit  Medication Sig Dispense Refill  . albuterol (VENTOLIN HFA) 108 (90 Base) MCG/ACT inhaler Inhale 2 puffs into the lungs every 6 (six) hours as  needed for wheezing or shortness of breath. 18 g 5  . ALL DAY ALLERGY 10 MG tablet Take 1 tablet by mouth daily as needed.   3  . alprazolam (XANAX) 2 MG tablet Take 1 tablet (2 mg total) by mouth 2 (two) times daily as needed for anxiety. 60 tablet 5  . Artificial Tear Ointment (DRY EYES OP) Place 1 drop into both eyes daily as needed (for dry eyes).    Marland Kitchen aspirin EC 81 MG tablet Take 1 tablet (81 mg total) by mouth daily.    Marland Kitchen azelastine (ASTELIN) 0.1 % nasal spray Place 2 sprays into both nostrils 2 (two) times daily. Use in each nostril as directed 30 mL 12  . carvedilol (COREG) 6.25 MG tablet Take 1 tablet (6.25 mg total) by mouth 2 (two) times daily with a meal. 270 tablet 3  . cyclobenzaprine (FLEXERIL) 10 MG tablet Take 1 tablet (10 mg total) by mouth 3 (three) times daily as needed for muscle spasms. 40 tablet 1  . diltiazem (CARDIZEM CD) 180 MG 24 hr capsule Take 1 capsule (180 mg total) by mouth daily. 90 capsule 3  . diphenhydramine-acetaminophen (TYLENOL PM) 25-500 MG TABS tablet Take 1 tablet by mouth at bedtime as needed (for sleep).    . fexofenadine (ALLEGRA ALLERGY) 180 MG tablet Take 1 tablet (180 mg total) by mouth 2 (two) times daily as needed for allergies or rhinitis.    Marland Kitchen FLUoxetine (PROZAC) 20 MG tablet Take 0.5-1 tablets (10-20 mg total) by mouth daily. 30 tablet 2  . fluticasone (FLONASE) 50 MCG/ACT nasal spray Place 2 sprays into both nostrils daily. 48 g 3  . HYDROcodone-acetaminophen (NORCO) 10-325 MG tablet Take 1 tablet by mouth every 8 (eight) hours as needed. 65 tablet 0  . hyoscyamine (LEVBID) 0.375 MG 12 hr tablet Take 1 tablet (0.375 mg total) by mouth 3 (three) times daily as needed. 90 tablet 1  . lactobacillus acidophilus (BACID) TABS tablet Take 2 tablets by mouth 3 (three) times daily.    . montelukast (SINGULAIR) 10 MG tablet Take 10 mg by mouth at bedtime.    . nitroGLYCERIN (NITROSTAT) 0.4 MG SL tablet DISSOLVE 1 TABLET UNDER TONGUE EVERY 5 MINUTES AS  NEEDED FOR CHEST PAIN 300 tablet 1  . pantoprazole (PROTONIX) 40 MG tablet Take 1 tablet (40 mg total) by mouth daily. 90 tablet 1  . ranitidine (ZANTAC) 300 MG tablet Take 1 tablet (300 mg total) by mouth at bedtime. 90 tablet 1  . sucralfate (CARAFATE) 1  g tablet Take 1 tablet (1 g total) by mouth 4 (four) times daily -  with meals and at bedtime. 368 tablet 0  . traZODone (DESYREL) 50 MG tablet Take 0.5-1 tablets (25-50 mg total) by mouth at bedtime as needed for sleep. 90 tablet 1  . zolpidem (AMBIEN) 5 MG tablet Take 1 tablet (5 mg total) by mouth at bedtime as needed for sleep. 30 tablet 5   No facility-administered medications prior to visit.     Allergies  Allergen Reactions  . Iodine-131 Other (See Comments)    Other reaction(s): Dizziness (intolerance)  . Sulfamethoxazole Shortness Of Breath    chest tightness  . Amitriptyline Anxiety and Other (See Comments)    Depression, insomnia  . Effexor [Venlafaxine]     palpitations  . Iohexol Other (See Comments)    Passed out  . Lactose Intolerance (Gi) Diarrhea and Nausea And Vomiting  . Lexapro [Escitalopram Oxalate]     nausea  . Iodinated Diagnostic Agents     Pt states at Dr.Grapeys office 15 years ago blacked out for 2 hours during injection for IVP. Was told never to have IV dye again.     Review of Systems  Constitutional: Negative for fever and malaise/fatigue.  HENT: Negative for congestion.   Eyes: Negative for blurred vision.  Respiratory: Negative for shortness of breath.   Cardiovascular: Negative for chest pain, palpitations and leg swelling.  Gastrointestinal: Negative for abdominal pain, blood in stool and nausea.  Genitourinary: Negative for frequency.  Musculoskeletal: Negative for falls.  Skin: Negative for rash.  Neurological: Negative for dizziness, loss of consciousness and headaches.  Endo/Heme/Allergies: Negative for environmental allergies.  Psychiatric/Behavioral: Positive for depression. The  patient is nervous/anxious.        Objective:    Physical Exam  Constitutional: She is oriented to person, place, and time. She appears well-developed and well-nourished. No distress.  HENT:  Head: Normocephalic and atraumatic.  Nose: Nose normal.  Eyes: Right eye exhibits no discharge. Left eye exhibits no discharge.  Neck: Normal range of motion. Neck supple.  Cardiovascular: Normal rate and regular rhythm.   Murmur heard. Pulmonary/Chest: Effort normal and breath sounds normal.  Abdominal: Soft. Bowel sounds are normal. There is no tenderness.  Musculoskeletal: She exhibits no edema.  Neurological: She is alert and oriented to person, place, and time.  Skin: Skin is warm and dry.  Psychiatric: She has a normal mood and affect.  Nursing note and vitals reviewed.   BP 130/64 (BP Location: Left Arm, Patient Position: Sitting, Cuff Size: Normal)   Pulse 71   Temp 97.6 F (36.4 C) (Oral)   Wt 163 lb 12.8 oz (74.3 kg)   SpO2 97%   BMI 26.44 kg/m  Wt Readings from Last 3 Encounters:  07/17/16 163 lb 12.8 oz (74.3 kg)  07/16/16 162 lb (73.5 kg)  06/16/16 162 lb 1.9 oz (73.5 kg)     Lab Results  Component Value Date   WBC 8.2 07/17/2016   HGB 13.5 07/17/2016   HCT 40.3 07/17/2016   PLT 320.0 07/17/2016   GLUCOSE 89 07/17/2016   CHOL 218 (H) 07/17/2016   TRIG 115.0 07/17/2016   HDL 49.30 07/17/2016   LDLDIRECT 165.0 06/30/2011   LDLCALC 146 (H) 07/17/2016   ALT 14 07/17/2016   AST 15 07/17/2016   NA 141 07/17/2016   K 4.3 07/17/2016   CL 103 07/17/2016   CREATININE 0.67 07/17/2016   BUN 8 07/17/2016   CO2 29 07/17/2016  TSH 0.86 05/06/2016   INR 1.06 01/14/2016   HGBA1C 5.6 07/17/2016    Lab Results  Component Value Date   TSH 0.86 05/06/2016   Lab Results  Component Value Date   WBC 8.2 07/17/2016   HGB 13.5 07/17/2016   HCT 40.3 07/17/2016   MCV 93.4 07/17/2016   PLT 320.0 07/17/2016   Lab Results  Component Value Date   NA 141 07/17/2016   K  4.3 07/17/2016   CO2 29 07/17/2016   GLUCOSE 89 07/17/2016   BUN 8 07/17/2016   CREATININE 0.67 07/17/2016   BILITOT 0.3 07/17/2016   ALKPHOS 100 07/17/2016   AST 15 07/17/2016   ALT 14 07/17/2016   PROT 7.4 07/17/2016   ALBUMIN 4.2 07/17/2016   CALCIUM 9.6 07/17/2016   ANIONGAP 6 01/14/2016   GFR 93.59 07/17/2016   Lab Results  Component Value Date   CHOL 218 (H) 07/17/2016   Lab Results  Component Value Date   HDL 49.30 07/17/2016   Lab Results  Component Value Date   LDLCALC 146 (H) 07/17/2016   Lab Results  Component Value Date   TRIG 115.0 07/17/2016   Lab Results  Component Value Date   CHOLHDL 4 07/17/2016   Lab Results  Component Value Date   HGBA1C 5.6 07/17/2016       Assessment & Plan:   Problem List Items Addressed This Visit    Vitamin B12 deficiency   Relevant Orders   Vitamin B12 (Completed)   ANXIETY DEPRESSION    Very stressed about upcoming vascular procedure but managing well with current meds no changes      Essential hypertension    Improved today with recent medication adjustments but patient aware of importance of having her renal artery procedure completed.      Hyperglycemia - Primary     minimize simple carbs. Increase exercise as tolerated.       Relevant Orders   Hemoglobin A1c (Completed)   Osteoporosis    Check vitamin D today check a level      Relevant Orders   Comprehensive metabolic panel (Completed)   CBC (Completed)   Hyperlipidemia, mixed   Relevant Orders   Lipid panel (Completed)   Renal artery stenosis ([place])    Has been evaluated by vascular surgery and they have set her up to have a stent placed to protect her kidney.       Relevant Orders   Comprehensive metabolic panel (Completed)   CBC (Completed)   Vitamin D deficiency    Level wnl, continue supplements      Relevant Orders   VITAMIN D 25 Hydroxy (Vit-D Deficiency, Fractures) (Completed)      I am having Ms. Carignan start on  acyclovir. I am also having her maintain her lactobacillus acidophilus, cyclobenzaprine, diphenhydramine-acetaminophen, Artificial Tear Ointment (DRY EYES OP), fexofenadine, hyoscyamine, ALL DAY ALLERGY, albuterol, azelastine, HYDROcodone-acetaminophen, FLUoxetine, nitroGLYCERIN, aspirin EC, montelukast, alprazolam, fluticasone, pantoprazole, sucralfate, traZODone, ranitidine, zolpidem, carvedilol, and diltiazem.  Meds ordered this encounter  Medications  . acyclovir (ZOVIRAX) 400 MG tablet    Sig: 1 tab po tid x7D    Dispense:  21 tablet    Refill:  0     Penni Homans, MD

## 2016-07-21 ENCOUNTER — Encounter: Payer: Self-pay | Admitting: Family Medicine

## 2016-07-22 ENCOUNTER — Other Ambulatory Visit: Payer: Self-pay | Admitting: Family Medicine

## 2016-07-22 DIAGNOSIS — M25579 Pain in unspecified ankle and joints of unspecified foot: Secondary | ICD-10-CM

## 2016-07-22 DIAGNOSIS — M545 Low back pain: Secondary | ICD-10-CM

## 2016-07-23 ENCOUNTER — Other Ambulatory Visit (INDEPENDENT_AMBULATORY_CARE_PROVIDER_SITE_OTHER): Payer: Commercial Managed Care - HMO

## 2016-07-23 ENCOUNTER — Encounter: Payer: Self-pay | Admitting: Family Medicine

## 2016-07-23 DIAGNOSIS — M545 Low back pain: Secondary | ICD-10-CM | POA: Diagnosis not present

## 2016-07-23 DIAGNOSIS — R8299 Other abnormal findings in urine: Secondary | ICD-10-CM | POA: Diagnosis not present

## 2016-07-23 LAB — URINALYSIS, ROUTINE W REFLEX MICROSCOPIC
Bilirubin Urine: NEGATIVE
Hgb urine dipstick: NEGATIVE
KETONES UR: NEGATIVE
NITRITE: NEGATIVE
PH: 6 (ref 5.0–8.0)
RBC / HPF: NONE SEEN (ref 0–?)
SPECIFIC GRAVITY, URINE: 1.015 (ref 1.000–1.030)
TOTAL PROTEIN, URINE-UPE24: NEGATIVE
URINE GLUCOSE: NEGATIVE
UROBILINOGEN UA: 0.2 (ref 0.0–1.0)

## 2016-07-23 MED ORDER — HYDROCODONE-ACETAMINOPHEN 10-325 MG PO TABS: 1.0000 | ORAL_TABLET | Freq: Three times a day (TID) | ORAL | 0 refills | Status: DC | PRN

## 2016-07-23 MED FILL — HYDROCODON-APAP 10-325: 10-325 | 22 days supply | Qty: 65 | Fill #0

## 2016-07-23 NOTE — Telephone Encounter (Signed)
Me   Me 10:01 AM  Note    Rx response from provider on 07/22/16 was never answered and/or completed; asked Dr. Lorelei Pont if she would sign for patient's Hydrocodone approved by PCP on 07/22/16, she agreed and Rx was printed, signed, placed at front desk; patient informed via phone, understood & agreed. Pt will also give urine specimen for urine culture ordered placed 07/22/16 by PCP/SLS 12/06  Conversation: Visit Follow-Up Question  (Newest Message First)  Megan [name]  to [name], MD      07/23/16 9:29 AM  I got yr message yesterday to come in for a UTI test. But I couldn't make it yesterday but if the order has been put in at the lab I will come today and also while I am there if Dr. Charlett Blake could have my Hydrocodone prescription at front desk so I could pick that up at same time I would appreciate it. Thank you, Megan [name]      Me    10:00 AM  Note    [name], MD 07/22/16 to Megan [name], RMA     10:11 PM  OK to refill the hydrocodone  Megan [name], CMA  to [name], MD     1:20 PM  Requesting: Hydrocodone  Contract  10/18/2014  UDS  Moderate---due on 4/17  Last OV   07/17/2016  Last Refill  #65 With 0 refill  06/12/2016  Please Advise  Megan [name]  to Patient Medication Renewal Request Pool      12:38 PM  Megan [name] would like a refill of the following medications:  HYDROcodone-acetaminophen (NORCO) 10-325 MG tablet [BLYTH, STACEY, MD]  Preferred pharmacy: [facility], [place] [address]            9:51 AM    You contacted Megan [name]  July 22, 2016  [name], MD  to [name], RMA       10:11 PM  OK to refill the hydrocodone  Megan [name], CMA  to [name], MD       1:20 PM  Requesting: Hydrocodone  Contract  10/18/2014  UDS  Moderate---due on 4/17  Last OV   07/17/2016  Last Refill  #65 With 0  refill  06/12/2016   Please Advise  Megan [name]  to Patient Medication Renewal Request Pool     12:38 PM  Megan [name] would like a refill of the following medications:    HYDROcodone-acetaminophen (NORCO) 10-325 MG tablet Megan Homans, MD]   Preferred pharmacy: [place]      Conversation: Visit Follow-Up Question  (Newest Message First)  Megan [name]  to [name], MD       07/23/16 9:29 AM  I got yr message yesterday to come in for a UTI test. But I couldn't make it yesterday but if the order has been put in at the lab I will come today and also while I am there if Dr. Charlett Blake could have my Hydrocodone prescription at front desk so I could pick that up at same time I would appreciate it. Thank you, Megan [name]

## 2016-07-23 NOTE — Telephone Encounter (Signed)
Megan Lukes, MD 07/22/16 to Megan [name], RMA     10:11 PM  OK to refill the hydrocodone  Megan [name], CMA  to Megan Lukes, MD     1:20 PM  Requesting: Hydrocodone  Contract  10/18/2014  UDS  Moderate---due on 4/17  Last OV   07/17/2016  Last Refill  #65 With 0 refill  06/12/2016  Please Advise  Megan [name]  to Patient Medication Renewal Request Pool      12:38 PM  Megan [name] would like a refill of the following medications:  HYDROcodone-acetaminophen (NORCO) 10-325 MG tablet Megan Homans, MD]  Preferred pharmacy: [facility], [place] - [place]

## 2016-07-23 NOTE — Telephone Encounter (Signed)
Rx response from provider on 07/22/16 was never answered and/or completed; asked Dr. Lorelei Pont if she would sign for patient's Hydrocodone approved by PCP on 07/22/16, she agreed and Rx was printed, signed, placed at front desk; patient informed via phone, understood & agreed. Pt will also give urine specimen for urine culture ordered placed 07/22/16 by PCP/SLS 12/06  Conversation: Visit Follow-Up Question  (Newest Message First)  Megan [name]  to [name], MD      07/23/16 9:29 AM  I got yr message yesterday to come in for a UTI test. But I couldn't make it yesterday but if the order has been put in at the lab I will come today and also while I am there if Dr. Charlett Blake could have my Hydrocodone prescription at front desk so I could pick that up at same time I would appreciate it. Thank you, Megan [name]

## 2016-07-24 ENCOUNTER — Encounter: Payer: Self-pay | Admitting: Family Medicine

## 2016-07-24 ENCOUNTER — Encounter (HOSPITAL_COMMUNITY)
Admission: RE | Disposition: A | Discharge status: Home / Self Care | Payer: Self-pay | Source: Ambulatory Visit | Attending: Vascular Surgery

## 2016-07-24 ENCOUNTER — Encounter (HOSPITAL_COMMUNITY): Payer: Self-pay | Admitting: Vascular Surgery

## 2016-07-24 ENCOUNTER — Ambulatory Visit (HOSPITAL_COMMUNITY)
Admission: RE | Admit: 2016-07-24 | Discharge: 2016-07-24 | Disposition: A | Discharge status: Home / Self Care | Payer: Commercial Managed Care - HMO | Source: Ambulatory Visit | Attending: Vascular Surgery | Admitting: Vascular Surgery

## 2016-07-24 DIAGNOSIS — E039 Hypothyroidism, unspecified: Secondary | ICD-10-CM | POA: Diagnosis not present

## 2016-07-24 DIAGNOSIS — I701 Atherosclerosis of renal artery: Secondary | ICD-10-CM | POA: Diagnosis present

## 2016-07-24 DIAGNOSIS — Z87891 Personal history of nicotine dependence: Secondary | ICD-10-CM | POA: Diagnosis not present

## 2016-07-24 DIAGNOSIS — Z7982 Long term (current) use of aspirin: Secondary | ICD-10-CM | POA: Insufficient documentation

## 2016-07-24 DIAGNOSIS — E782 Mixed hyperlipidemia: Secondary | ICD-10-CM | POA: Diagnosis not present

## 2016-07-24 DIAGNOSIS — I10 Essential (primary) hypertension: Secondary | ICD-10-CM | POA: Insufficient documentation

## 2016-07-24 HISTORY — PX: PERIPHERAL VASCULAR CATHETERIZATION: SHX172C

## 2016-07-24 LAB — CULTURE, URINE COMPREHENSIVE: Organism ID, Bacteria: NO GROWTH

## 2016-07-24 LAB — POCT I-STAT, CHEM 8
BUN: 6 mg/dL (ref 6–20)
CALCIUM ION: 1.18 mmol/L (ref 1.15–1.40)
Chloride: 103 mmol/L (ref 101–111)
Creatinine, Ser: 0.6 mg/dL (ref 0.44–1.00)
GLUCOSE: 99 mg/dL (ref 65–99)
HCT: 40 % (ref 36.0–46.0)
HEMOGLOBIN: 13.6 g/dL (ref 12.0–15.0)
Potassium: 3.9 mmol/L (ref 3.5–5.1)
Sodium: 141 mmol/L (ref 135–145)
TCO2: 26 mmol/L (ref 0–100)

## 2016-07-24 SURGERY — RENAL ANGIOGRAPHY

## 2016-07-24 MED ORDER — SODIUM CHLORIDE 0.9 % IV SOLN
INTRAVENOUS | Status: DC
  Administered 2016-07-24: 06:00:00 via INTRAVENOUS

## 2016-07-24 MED ORDER — METHYLPREDNISOLONE SODIUM SUCC 125 MG IJ SOLR
125.0000 mg | INTRAMUSCULAR | Status: AC
  Administered 2016-07-24: 125 mg via INTRAVENOUS

## 2016-07-24 MED ORDER — SODIUM CHLORIDE 0.9 % IV SOLN: 1.0000 mL/kg/h | INTRAVENOUS | Status: DC

## 2016-07-24 MED ORDER — DIPHENHYDRAMINE HCL 50 MG/ML IJ SOLN
INTRAMUSCULAR | Status: AC
  Administered 2016-07-24: 25 mg via INTRAVENOUS
  Filled 2016-07-24: qty 1

## 2016-07-24 MED ORDER — MIDAZOLAM HCL 2 MG/2ML IJ SOLN
INTRAMUSCULAR | Status: AC
  Filled 2016-07-24: qty 2

## 2016-07-24 MED ORDER — FENTANYL CITRATE (PF) 100 MCG/2ML IJ SOLN
INTRAMUSCULAR | Status: AC
  Filled 2016-07-24: qty 2

## 2016-07-24 MED ORDER — MIDAZOLAM HCL 2 MG/2ML IJ SOLN
INTRAMUSCULAR | Status: DC | PRN
  Administered 2016-07-24: 1 mg via INTRAVENOUS

## 2016-07-24 MED ORDER — ACETAMINOPHEN 325 MG PO TABS: 650.0000 mg | ORAL_TABLET | ORAL | Status: DC | PRN

## 2016-07-24 MED ORDER — FAMOTIDINE IN NACL 20-0.9 MG/50ML-% IV SOLN
20.0000 mg | INTRAVENOUS | Status: AC
  Administered 2016-07-24: 20 mg via INTRAVENOUS

## 2016-07-24 MED ORDER — DIPHENHYDRAMINE HCL 50 MG/ML IJ SOLN
25.0000 mg | INTRAMUSCULAR | Status: AC
  Administered 2016-07-24: 25 mg via INTRAVENOUS

## 2016-07-24 MED ORDER — HEPARIN (PORCINE) IN NACL 2-0.9 UNIT/ML-% IJ SOLN
INTRAMUSCULAR | Status: DC | PRN
  Administered 2016-07-24: 1000 mL via INTRA_ARTERIAL

## 2016-07-24 MED ORDER — METHYLPREDNISOLONE SODIUM SUCC 125 MG IJ SOLR
INTRAMUSCULAR | Status: AC
  Administered 2016-07-24: 125 mg via INTRAVENOUS
  Filled 2016-07-24: qty 2

## 2016-07-24 MED ORDER — IODIXANOL 320 MG/ML IV SOLN
INTRAVENOUS | Status: DC | PRN
  Administered 2016-07-24: 45 mL via INTRA_ARTERIAL

## 2016-07-24 MED ORDER — LIDOCAINE HCL (PF) 1 % IJ SOLN
INTRAMUSCULAR | Status: AC
  Filled 2016-07-24: qty 30

## 2016-07-24 MED ORDER — LIDOCAINE HCL (PF) 1 % IJ SOLN
INTRAMUSCULAR | Status: DC | PRN
  Administered 2016-07-24: 12 mL

## 2016-07-24 MED ORDER — FAMOTIDINE IN NACL 20-0.9 MG/50ML-% IV SOLN
INTRAVENOUS | Status: AC
  Administered 2016-07-24: 20 mg via INTRAVENOUS
  Filled 2016-07-24: qty 50

## 2016-07-24 MED ORDER — HEPARIN (PORCINE) IN NACL 2-0.9 UNIT/ML-% IJ SOLN
INTRAMUSCULAR | Status: AC
Start: 2016-07-24 — End: 2016-07-24
  Filled 2016-07-24: qty 1000

## 2016-07-24 MED ORDER — FENTANYL CITRATE (PF) 100 MCG/2ML IJ SOLN
INTRAMUSCULAR | Status: DC | PRN
  Administered 2016-07-24: 50 ug via INTRAVENOUS

## 2016-07-24 SURGICAL SUPPLY — 13 items
CATH OMNI FLUSH 5F 65CM (CATHETERS) ×4 IMPLANT
CATH SOFT-VU 4F 65 STRAIGHT (CATHETERS) ×2 IMPLANT
CATH SOFT-VU STRAIGHT 4F 65CM (CATHETERS) ×2
CATH SOS OMNI O 5F 80CM (CATHETERS) ×4 IMPLANT
COVER PRB 48X5XTLSCP FOLD TPE (BAG) ×2 IMPLANT
COVER PROBE 5X48 (BAG) ×2
KIT MICROINTRODUCER STIFF 5F (SHEATH) ×4 IMPLANT
KIT PV (KITS) ×4 IMPLANT
SHEATH PINNACLE 6F 10CM (SHEATH) ×4 IMPLANT
SYR MEDRAD MARK V 150ML (SYRINGE) ×4 IMPLANT
TRANSDUCER W/STOPCOCK (MISCELLANEOUS) ×4 IMPLANT
TRAY PV CATH (CUSTOM PROCEDURE TRAY) ×4 IMPLANT
WIRE BENTSON .035X145CM (WIRE) ×4 IMPLANT

## 2016-07-24 NOTE — Progress Notes (Addendum)
Site area: Right groin a 6 french arterial sheath was removed by Adalberto Ill RN   Site Prior to Removal:  Level 0  Pressure Applied For 20 MINUTES    Bedrest Beginning at 0900am  Manual:   Yes.    Patient Status During Pull:  stable  Post Pull Groin Site:  Level 0  Post Pull Instructions Given:  Yes.    Post Pull Pulses Present:  Yes.    Dressing Applied:  Yes.    Comments:  VS remain stable during sheath

## 2016-07-24 NOTE — H&P (View-Only) (Signed)
Referred by:  [name], MD [address] [place], [place] [address]   Reason for referral: Left renal artery stenosis    History of Present Illness  Megan [name] is a 65 y.o. ([birth year]) female who presents with cc: abnormal CT.  This patient had undergone a CT abd/pelvis with contrast for abdominal pain.  The patient had not had a bowel movement in 2-3 weeks.  Incidentally on that CT, a high grade stenosis of the L renal artery had been found.  The patient notes no changes in a her regular urinary habits.  She has never had any oliguria or anuria.  She denies any problems with HTN, rather she has had some episodes of lower BP.  She denies any flank complaints.   Past Medical History:  Diagnosis Date  . 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 Allergic state 12/31/2012  . Anxiety   . Asthma   . Barrett esophagus   . Blind loop syndrome   . C. difficile diarrhea   . Cancer ([place])   . Chest pain, atypical 07/14/2011  . Colon polyp   . Contact dermatitis 03/23/2012  . Cystocele, midline   . Depression   . Diarrhea   . Diverticulosis   . Endometrial hyperplasia 02/27/2006   BENIGN ENDO BX ON 02/2007  . Family history of malignant neoplasm of gastrointestinal tract    multiple members  . GERD (gastroesophageal reflux disease)   . Headache 04/16/2015  . Headache(784.0) 04/15/2012  . Hematuria 04/27/2012  . Hepatic cyst   . Hiatal hernia   . Hiatal hernia   . Hyperlipidemia, mixed 12/17/2014  . Hypertension   . IBS (irritable bowel syndrome)   . Insomnia   . Leaky heart valve   . Low back pain 08/15/2013  . Osteoporosis 05/2011   T score-2.5 Lt femoral neck 2012, T score -2.4 2016 FRAX 12%/2.4%  . 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 Paronychia of great toe, left 06/19/2013  . Preventative health care 09/11/2014  . Rectal bleeding 10/10/2011  . Rectocele   . 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 Sinusitis acute 02/03/2012  . Thrush  07/21/2011  . 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 Unspecified hypothyroidism   . Unspecified menopausal and postmenopausal disorder   . Uterine prolapse without mention of vaginal wall prolapse   . Vitamin B12 deficiency     Past Surgical History:  Procedure Laterality Date  . APPENDECTOMY    . HYSTEROSCOPY     POLYP  . UTERINE FIBROID SURGERY      Social History   Social History  . Marital status: Married    Spouse name: N/A  . Number of children: 4  . Years of education: N/A   Occupational History  . Retired    Social History Main Topics  . Smoking status: Former Smoker    Quit date: 08/19/1995  . Smokeless tobacco: Never Used  . Alcohol use No  . Drug use: No  . Sexual activity: Not Currently    Birth control/ protection: Post-menopausal     Comment: 1st intercourse 40 yo-5 partners   Other Topics Concern  . Not on file   Social History Narrative  . No narrative on file    Family History  Problem Relation Age of Onset  . Colon cancer Mother   . Diabetes Mother   . Hypertension Mother   . Colon polyps Father   . Parkinson's disease Father   .  Colon cancer Maternal Grandmother   . Breast cancer Maternal Grandmother 30  . Diabetes Paternal Grandmother   . Breast cancer Paternal Grandmother 89  . Allergies Sister   . Arthritis Brother     b/l hip replace  . Alcohol abuse Daughter   . Arthritis Son   . Hypertension Son   . Allergies Son   . Osteoporosis Son   . Osteoporosis Son   . Arthritis Son     back disease, DDD  . Allergies Son   . Arthritis Son   . Irritable bowel syndrome Son     RSD  . Hypertension Paternal Grandfather   . Heart disease Paternal Grandfather   . Aneurysm Paternal Grandfather   . Colon cancer Maternal Aunt   . Colon cancer Cousin     X3  . Ovarian cancer Cousin     Current Outpatient Prescriptions  Medication Sig Dispense Refill  . albuterol (VENTOLIN HFA) 108 (90 Base) MCG/ACT inhaler Inhale 2 puffs into the lungs  every 6 (six) hours as needed for wheezing or shortness of breath. 18 g 5  . ALL DAY ALLERGY 10 MG tablet Take 1 tablet by mouth daily as needed.   3  . alprazolam (XANAX) 2 MG tablet Take 1 tablet (2 mg total) by mouth 2 (two) times daily as needed for anxiety. 60 tablet 5  . Artificial Tear Ointment (DRY EYES OP) Place 1 drop into both eyes daily as needed (for dry eyes).    Marland Kitchen aspirin EC 81 MG tablet Take 1 tablet (81 mg total) by mouth daily.    Marland Kitchen azelastine (ASTELIN) 0.1 % nasal spray Place 2 sprays into both nostrils 2 (two) times daily. Use in each nostril as directed 30 mL 12  . carvedilol (COREG) 6.25 MG tablet Take 1 tablet (6.25 mg total) by mouth 2 (two) times daily with a meal. 270 tablet 3  . cyclobenzaprine (FLEXERIL) 10 MG tablet Take 1 tablet (10 mg total) by mouth 3 (three) times daily as needed for muscle spasms. 40 tablet 1  . diltiazem (CARDIZEM CD) 180 MG 24 hr capsule Take 1 capsule (180 mg total) by mouth daily. 90 capsule 3  . diphenhydramine-acetaminophen (TYLENOL PM) 25-500 MG TABS tablet Take 1 tablet by mouth at bedtime as needed (for sleep).    . fexofenadine (ALLEGRA ALLERGY) 180 MG tablet Take 1 tablet (180 mg total) by mouth 2 (two) times daily as needed for allergies or rhinitis.    Marland Kitchen FLUoxetine (PROZAC) 20 MG tablet Take 0.5-1 tablets (10-20 mg total) by mouth daily. 30 tablet 2  . fluticasone (FLONASE) 50 MCG/ACT nasal spray Place 2 sprays into both nostrils daily. 48 g 3  . HYDROcodone-acetaminophen (NORCO) 10-325 MG tablet Take 1 tablet by mouth every 8 (eight) hours as needed. 65 tablet 0  . hyoscyamine (LEVBID) 0.375 MG 12 hr tablet Take 1 tablet (0.375 mg total) by mouth 3 (three) times daily as needed. 90 tablet 1  . lactobacillus acidophilus (BACID) TABS tablet Take 2 tablets by mouth 3 (three) times daily.    . montelukast (SINGULAIR) 10 MG tablet Take 10 mg by mouth at bedtime.    . nitroGLYCERIN (NITROSTAT) 0.4 MG SL tablet DISSOLVE 1 TABLET UNDER TONGUE  EVERY 5 MINUTES AS NEEDED FOR CHEST PAIN 300 tablet 1  . pantoprazole (PROTONIX) 40 MG tablet Take 1 tablet (40 mg total) by mouth daily. 90 tablet 1  . ranitidine (ZANTAC) 300 MG tablet Take 1 tablet (300 mg total)  by mouth at bedtime. 90 tablet 1  . sucralfate (CARAFATE) 1 g tablet Take 1 tablet (1 g total) by mouth 4 (four) times daily -  with meals and at bedtime. 368 tablet 0  . traZODone (DESYREL) 50 MG tablet Take 0.5-1 tablets (25-50 mg total) by mouth at bedtime as needed for sleep. 90 tablet 1  . zolpidem (AMBIEN) 5 MG tablet Take 1 tablet (5 mg total) by mouth at bedtime as needed for sleep. 30 tablet 5   No current facility-administered medications for this visit.     Allergies  Allergen Reactions  . Iodine-131 Other (See Comments)    Other reaction(s): Dizziness (intolerance)  . Sulfamethoxazole Shortness Of Breath    chest tightness  . Amitriptyline Anxiety and Other (See Comments)    Depression, insomnia  . Effexor [Venlafaxine]     palpitations  . Iohexol Other (See Comments)    Passed out  . Lactose Intolerance (Gi) Diarrhea and Nausea And Vomiting  . Lexapro [Escitalopram Oxalate]     nausea  . Iodinated Diagnostic Agents     Pt states at Dr.Grapeys office 15 years ago blacked out for 2 hours during injection for IVP. Was told never to have IV dye again.      REVIEW OF SYSTEMS:   Cardiac:  positive for: no symptoms, negative for: Chest pain or chest pressure, Shortness of breath upon exertion and Shortness of breath when lying flat,   Vascular:  positive for: no symptoms,  negative for: Pain in calf, thigh, or hip brought on by ambulation, Pain in feet at night that wakes you up from your sleep, Blood clot in your veins and Leg swelling  Pulmonary:  positive for: no symptoms,  negative for: Oxygen at home, Productive cough and Wheezing  Neurologic:  positive for: No symptoms, negative for: Sudden weakness in arms or legs, Sudden numbness in arms or  legs, Sudden onset of difficulty speaking or slurred speech, Temporary loss of vision in one eye and Problems with dizziness  Gastrointestinal:  positive for: no symptoms, negative for: Blood in stool and Vomited blood  Genitourinary:  positive for: no symptoms, negative for: Burning when urinating and Blood in urine  Psychiatric:  positive for: no symptoms,  negative for: Major depression  Hematologic:  positive for: no symptoms,  negative for: negative for: Bleeding problems and Problems with blood clotting too easily  Dermatologic:  positive for: no symptoms, negative for: Rashes or ulcers  Constitutional:  positive for: no symptoms, negative for: Fever or chills  Ear/Nose/Throat:  positive for: no symptoms, negative for: Change in hearing, Nose bleeds and Sore throat  Musculoskeletal:  positive for: no symptoms, negative for: Back pain, Joint pain and Muscle pain   For VQI Use Only  PRE-ADM LIVING: Home  AMB STATUS: Ambulatory  CAD Sx: None  PRIOR CHF: None  STRESS TEST: No   Physical Examination  Vitals:   07/16/16 0925  BP: 128/68  Pulse: 62  Resp: 16  Temp: 98 F (36.7 C)  TempSrc: Oral  SpO2: 98%  Weight: 162 lb (73.5 kg)  Height: 5\' 6"  (1.676 m)    Body mass index is 26.15 kg/m.  General: Alert, O x 3, WD,NAD  Head: [place]/AT,   Ear/Nose/Throat: Hearing grossly intact, nares without erythema or drainage, oropharynx without Erythema or Exudate , Mallampati score: 3, Dentition intact  Eyes: PERRLA, EOMI,   Neck: Supple, mid-line trachea,    Pulmonary: Sym exp, good B air movt,CTA B  Cardiac:  RRR, Nl S1, S2, no Murmurs, No rubs, No S3,S4  Vascular: Vessel Right Left  Radial Palpable Palpable  Brachial Palpable Palpable  Carotid Palpable, No Bruit Palpable, No Bruit  Aorta Not palpable N/A  Femoral Palpable Palpable  Popliteal Not palpable Not palpable  PT Palpable Palpable  DP Palpable Palpable   Gastrointestinal: soft,  non-distended, non-tender to palpation, No guarding or rebound, no HSM, no masses, no CVAT B, No palpable prominent aortic pulse,  no flank bruits  Musculoskeletal: M/S 5/5 throughout  , Extremities without ischemic changes  Neurologic: CN 2-12 intact , Pain and light touch intact in extremities , Motor exam as listed above  Psychiatric: Judgement intact, Mood & affect appropriate for pt's clinical situation  Dermatologic: See M/S exam for extremity exam, No rashes otherwise noted  Lymph : Palpable lymph nodes: None  CT abd/pelvis with contrast (05/30/16):  No bowel obstruction. No bowel wall or mesenteric thickening. No abscess. Appendix absent.  No renal or ureteral calculi.  No hydronephrosis.  Aortic atherosclerosis. Marked narrowing at the origin of the left renal artery. Question whether patient is hypertensive given the degree of renal artery stenosis on the left.  Stable small left adrenal adenoma.  Minimal ventral hernia containing only fat.  I have reviewed this patient's CTA, there is scatter calcific aortic stenosis with a calcific plaque extending from just distal to the SMA take off into the left renal arterial orifice.  Stenosis of this artery is >90%.   Outside Studies/Documentation 10 pages of outside documents were reviewed including: outpatient cardiology and PCP charts.   Medical Decision Making  ROSELYNE GUNN is a 65 y.o. female who presents with: asx L renal artery stenosis >90%, aortic atherosclerosis   Due to the geometry of the plaque extending into the L proximal renal artery, exact degree of stenosis cannot be clearly calculated.   There is enough, however, to be concerned that the L kidney is at risk for ischemia without intervention.  Despite the ASTRAL and CORAL trials, I offer L RA stenting in cases of potential renal compromise as the patient is likely to lose the kidney without intervention.  Based on the patient's vascular studies and  examination, I have offered the patient: Aortogram, L renal angiography and possible intervention. I discussed with the patient the nature of angiographic procedures, especially the limited patencies of any endovascular intervention.   The patient is aware of that the risks of an angiographic procedure include but are not limited to: bleeding, infection, access site complications, renal failure, embolization, rupture of vessel, dissection, arteriovenous fistula, possible need for emergent surgical intervention, possible need for surgical procedures to treat the patient's pathology, anaphylactic reaction to contrast, and stroke and death.   The patient is aware of the risks and agrees to proceed.  She is scheduled for 7 DEC 17.  I discussed in depth with the patient the nature of atherosclerosis, and emphasized the importance of maximal medical management including strict control of blood pressure, blood glucose, and lipid levels, obtaining regular exercise, antiplatelet agents, and cessation of smoking.   The patient is currently not on a statin as not medically indicated. The patient is currently on an anti-platelet: ASA.  The patient is aware that without maximal medical management the underlying atherosclerotic disease process will progress, limiting the benefit of any interventions.  Thank you for allowing [place] to participate in this patient's care.   Adele Barthel, MD, FACS [facility] Office: [phone] Pager: [phone]  07/16/2016, 8:34 AM

## 2016-07-24 NOTE — Discharge Instructions (Signed)
Angiogram, Care After °These instructions give you information about caring for yourself after your procedure. Your doctor may also give you more specific instructions. Call your doctor if you have any problems or questions after your procedure. °Follow these instructions at home: °· Take medicines only as told by your doctor. °· Follow your doctor's instructions about: °¨ Care of the area where the tube was inserted. °¨ Bandage (dressing) changes and removal. °· You may shower 24-48 hours after the procedure or as told by your doctor. °· Do not take baths, swim, or use a hot tub until your doctor approves. °· Every day, check the area where the tube was inserted. Watch for: °¨ Redness, swelling, or pain. °¨ Fluid, blood, or pus. °· Do not apply powder or lotion to the site. °· Do not lift anything that is heavier than 10 lb (4.5 kg) for 5 days or as told by your doctor. °· Ask your doctor when you can: °¨ Return to work or school. °¨ Do physical activities or play sports. °¨ Have sex. °· Do not drive or operate heavy machinery for 24 hours or as told by your doctor. °· Have someone with you for the first 24 hours after the procedure. °· Keep all follow-up visits as told by your doctor. This is important. °Contact a health care provider if: °· You have a fever. °· You have chills. °· You have more bleeding from the area where the tube was inserted. Hold pressure on the area. °· You have redness, swelling, or pain in the area where the tube was inserted. °· You have fluid or pus coming from the area. °Get help right away if: °· You have a lot of pain in the area where the tube was inserted. °· The area where the tube was inserted is bleeding, and the bleeding does not stop after 30 minutes of holding steady pressure on the area. °· The area near or just beyond the insertion site becomes pale, cool, tingly, or numb. °This information is not intended to replace advice given to you by your health care provider. Make  sure you discuss any questions you have with your health care provider. °Document Released: 10/31/2008 Document Revised: 01/10/2016 Document Reviewed: 01/05/2013 °Elsevier Interactive Patient Education © 2017 [employer] ° °

## 2016-07-24 NOTE — Interval H&P Note (Signed)
[facility]  History and Physical Update  The patient was interviewed and re-examined.  The patient's previous History and Physical has been reviewed and is unchanged from my consult.  There is no change in the plan of care: aortogram, left renal angiography, and possible intervention.   I discussed with the patient the nature of angiographic procedures, especially the limited patencies of any endovascular intervention.    The patient is aware of that the risks of an angiographic procedure include but are not limited to: bleeding, infection, access site complications, renal failure, embolization, rupture of vessel, dissection, arteriovenous fistula, possible need for emergent surgical intervention, possible need for surgical procedures to treat the patient's pathology, anaphylactic reaction to contrast, and stroke and death.    The patient is aware of the risks and agrees to proceed.   Adele Barthel, MD [facility] Office: [phone] Pager: [phone]  07/24/2016, 7:25 AM

## 2016-07-24 NOTE — Op Note (Deleted)
    OPERATIVE NOTE   PROCEDURE: 1. left radiocephalic arteriovenous fistula cannulation under ultrasound guidance 2. left arm fistulogram  PRE-OPERATIVE DIAGNOSIS: Malfunctioning left radiocephalic arteriovenous fistula  POST-OPERATIVE DIAGNOSIS: same as above   SURGEON: Adele Barthel, MD  ANESTHESIA: local  ESTIMATED BLOOD LOSS: 5 cc  FINDING(S): 1. Widely patent fistula with co-dominant drainage for forearm via cephalic and brachial venous system 2. Widely patent central venous system  SPECIMEN(S):  None  CONTRAST: 30 cc  INDICATIONS: Megan [name] is a 65 y.o. female who  presents with high pitch bruit in left radiocephalic arteriovenous fistula.  The patient is scheduled for left arm fistulogram.  The patient is aware the risks include but are not limited to: bleeding, infection, thrombosis of the cannulated access, and possible anaphylactic reaction to the contrast.  The patient is aware of the risks of the procedure and elects to proceed forward.  DESCRIPTION: After full informed written consent was obtained, the patient was brought back to the angiography suite and placed supine upon the angiography table.  Note, the patient had been premedicated with histamine blockers and IV solumedrol for prior history of contrast reaction.  The patient was connected to monitoring equipment.  The left fprearm was prepped and draped in the standard fashion for a right arm fistulogram.  Under ultrasound guidance, the left radiocephalic arteriovenous fistula was cannulated with a micropuncture needle.  The microwire was advanced into the fistula and the needle was exchanged for the a microsheath, which was lodged 2 cm into the access.  The wire was removed and the sheath was connected to the IV extension tubing.  Hand injections were completed to image the access from the antecubitum up to the level of superior vena cava.  The central venous structures were also imaged by hand injections.   Based on the images, this patient will need: no intervention.  A 4-0 Monocryl purse-string suture was sewn around the sheath.  The sheath was removed while tying down the suture.  A sterile bandage was applied to the puncture site.   COMPLICATIONS: none  CONDITION: stable  Adele Barthel, MD [facility] [place] Office: [phone] Pager: [phone]  07/24/2016 9:36 AM

## 2016-07-24 NOTE — Op Note (Signed)
OPERATIVE NOTE   PROCEDURE: 1.  Right common femoral artery cannulation under ultrasound guidance 2.  Placement of catheter in aorta 3.  Aortogram 4.  Conscious sedation for 30 minutes 5.  Selection of left renal artery 6.  Left renal artery angiography via catheter  PRE-OPERATIVE DIAGNOSIS: possible left renal artery stenosis >90%  POST-OPERATIVE DIAGNOSIS: same as above   SURGEON: Adele Barthel, MD  ANESTHESIA: conscious sedation  ESTIMATED BLOOD LOSS: 30 cc  CONTRAST: 45 cc  FINDING(S):  Aorta: patent with calcification throughout  Superior mesenteric artery: patent Celiac artery: patent   Right Left  RA patent Patent, gradient 5-7 mm Hg  CIA patent patent  EIA patent patent  IIA patent patent  CFA patent patent   SPECIMEN(S):  none  INDICATIONS:   Megan [name] is a 65 y.o. female who presents with recent CT which was suggestive of >90% stenosis in Left renal artery.  The patient presents for: Aortogram and left renal angiography with possible intervention.  This procedure is being done as a possible left renal salvage procedure.  I discussed with the patient the nature of angiographic procedures, especially the limited patencies of any endovascular intervention.  The patient is aware of that the risks of an angiographic procedure include but are not limited to: bleeding, infection, access site complications, renal failure, embolization, rupture of vessel, dissection, possible need for emergent surgical intervention, possible need for surgical procedures to treat the patient's pathology, and stroke and death.  The patient is aware of the risks and agrees to proceed.  DESCRIPTION: After full informed consent was obtained from the patient, the patient was brought back to the angiography suite.  The patient was placed supine upon the angiography table and connected to cardiopulmonary monitoring equipment.  The patient was then given conscious sedation, the amounts  of which are documented in the patient's chart.  A circulating radiologic technician maintained continuous monitoring of the patient's cardiopulmonary status.  Additionally, the control room radiologic technician provided backup monitoring throughout the procedure.  The patient was prepped and drape in the standard fashion for an angiographic procedure.  At this point, attention was turned to the right groin.  Under ultrasound guidance, the subcutaneous tissue surrounding the right common femoral artery was anesthesized with 1% lidocaine with epinephrine.  The artery was then cannulated with a micropuncture needle.  The microwire was advanced into the iliac arterial system.  The needle was exchanged for a microsheath, which was loaded into the common femoral artery over the wire.  The microwire was exchanged for a [facility] wire which was advanced into the aorta.  The microsheath was then exchanged for a 6-Fr sheath which was loaded into the common femoral artery.  The Omniflush catheter was then loaded over the wire up to the level of L1.  The catheter was connected to the power injector circuit.  After de-airring and de-clotting the circuit, a power injector aortogram was completed.  I pulled the catheter down to immediately at the renal artery level and did a slightly oblique and magnified view.  This demonstrated no angiographic evidence of hemodynamically significant renal artery stenosis.    I replaced the Bentson wire and exchanged the catheter for a SOS catheter, which I used to cannulate the left renal artery.  I did a hand injection of the left renal artery which demonstrated the patency of the mid-distal segment and branch segments.  I replaced the wire and then exchanged the catheter for a 4-Fr end-hole catheter.  I did a pullback gradient across the orifice of the left renal artery.  There was minimal gradient 5-7 mm Hg across the orifice, which was refutes the CT findings.  I removed the catheter  from the right femoral sheath.  The sheath was aspirated.  No clots were present and the sheath was reloaded with heparinized saline.   The sheath will be removed in the holding area.  Based on the images, this patient has no evidence of hemodynamically significant renal artery stenosis.   COMPLICATIONS: none  CONDITION: stable  Adele Barthel, MD [facility] [place] Office: [phone] Pager: [phone]  07/24/2016, 8:32 AM

## 2016-08-05 ENCOUNTER — Other Ambulatory Visit: Payer: Self-pay | Admitting: Family Medicine

## 2016-08-19 ENCOUNTER — Other Ambulatory Visit: Payer: Self-pay | Admitting: Family Medicine

## 2016-08-19 DIAGNOSIS — R109 Unspecified abdominal pain: Secondary | ICD-10-CM

## 2016-08-21 ENCOUNTER — Telehealth: Payer: Self-pay | Admitting: Family Medicine

## 2016-08-21 DIAGNOSIS — M25579 Pain in unspecified ankle and joints of unspecified foot: Secondary | ICD-10-CM

## 2016-08-21 NOTE — Telephone Encounter (Signed)
Requesting:  Hydrocodone Contract    10/18/2014 UDS    Moderate---due Last OV   07/17/2016 Last Refill     #[id number]   07/23/2016  Please Advise

## 2016-08-21 NOTE — Telephone Encounter (Signed)
Relation to pt: self Call back number:[phone]  Reason for call:  Patient requesting a refill HYDROcodone-acetaminophen (NORCO) 10-325 MG tablet

## 2016-08-21 NOTE — Telephone Encounter (Signed)
OK to refill update contract and get UDS

## 2016-08-22 ENCOUNTER — Telehealth: Payer: Commercial Managed Care - HMO | Admitting: Family

## 2016-08-22 ENCOUNTER — Encounter: Payer: Self-pay | Admitting: Family Medicine

## 2016-08-22 DIAGNOSIS — J019 Acute sinusitis, unspecified: Secondary | ICD-10-CM

## 2016-08-22 MED ORDER — HYDROCODONE-ACETAMINOPHEN 10-325 MG PO TABS: 1.0000 | ORAL_TABLET | Freq: Three times a day (TID) | ORAL | 0 refills | Status: DC | PRN

## 2016-08-22 MED ORDER — AMOXICILLIN-POT CLAVULANATE 875-125 MG PO TABS: 1.0000 | ORAL_TABLET | Freq: Two times a day (BID) | ORAL | 0 refills | Status: DC

## 2016-08-22 NOTE — Telephone Encounter (Signed)
Printed contract/prescription--PCP signed/attached UDS note to hardcopy Called the patient informed hardcopy ready for pickup/informed to update contract and UDS. The patient verbally understood/agreed to all instructions.

## 2016-08-22 NOTE — Progress Notes (Signed)

## 2016-08-25 ENCOUNTER — Ambulatory Visit (INDEPENDENT_AMBULATORY_CARE_PROVIDER_SITE_OTHER): Payer: Medicare HMO | Admitting: Family Medicine

## 2016-08-25 ENCOUNTER — Encounter: Payer: Self-pay | Admitting: Family Medicine

## 2016-08-25 DIAGNOSIS — J019 Acute sinusitis, unspecified: Secondary | ICD-10-CM | POA: Diagnosis not present

## 2016-08-25 DIAGNOSIS — I10 Essential (primary) hypertension: Secondary | ICD-10-CM | POA: Diagnosis not present

## 2016-08-25 DIAGNOSIS — R739 Hyperglycemia, unspecified: Secondary | ICD-10-CM

## 2016-08-25 MED ORDER — AMOXICILLIN-POT CLAVULANATE 875-125 MG PO TABS: 1.0000 | ORAL_TABLET | Freq: Two times a day (BID) | ORAL | 0 refills | Status: DC

## 2016-08-25 MED ORDER — METHYLPREDNISOLONE ACETATE 40 MG/ML IJ SUSP
40.0000 mg | Freq: Once | INTRAMUSCULAR | Status: AC
  Administered 2016-08-25: 40 mg via INTRAMUSCULAR

## 2016-08-25 MED ORDER — ALBUTEROL SULFATE HFA 108 (90 BASE) MCG/ACT IN AERS
2.0000 | INHALATION_SPRAY | Freq: Four times a day (QID) | RESPIRATORY_TRACT | 5 refills | Status: DC | PRN
Start: 2016-08-25 — End: 2016-11-20

## 2016-08-25 MED ORDER — DILTIAZEM HCL ER COATED BEADS 180 MG PO CP24: 180.0000 mg | ORAL_CAPSULE | Freq: Every day | ORAL | 3 refills | Status: DC

## 2016-08-25 MED ORDER — MUPIROCIN 2 % EX OINT: 1.0000 "application " | TOPICAL_OINTMENT | Freq: Every day | CUTANEOUS | 0 refills | Status: DC

## 2016-08-25 MED FILL — HYDROCODON-APAP 10-325: 10-325 | 22 days supply | Qty: 65 | Fill #0

## 2016-08-25 NOTE — Patient Instructions (Signed)
Encouraged increased rest and hydration, add probiotics, zinc such as Coldeze or Xicam. Treat fevers as needed. Add Vitamin C 500 to 1000mg , Elderberry, Mucinex twice   Sinusitis, Adult Sinusitis is soreness and inflammation of your sinuses. Sinuses are hollow spaces in the bones around your face. Your sinuses are located:  Around your eyes.  In the middle of your forehead.  Behind your nose.  In your cheekbones. Your sinuses and nasal passages are lined with a stringy fluid (mucus). Mucus normally drains out of your sinuses. When your nasal tissues become inflamed or swollen, the mucus can become trapped or blocked so air cannot flow through your sinuses. This allows bacteria, viruses, and funguses to grow, which leads to infection. Sinusitis can develop quickly and last for 7?10 days (acute) or for more than 12 weeks (chronic). Sinusitis often develops after a cold. What are the causes? This condition is caused by anything that creates swelling in the sinuses or stops mucus from draining, including:  Allergies.  Asthma.  Bacterial or viral infection.  Abnormally shaped bones between the nasal passages.  Nasal growths that contain mucus (nasal polyps).  Narrow sinus openings.  Pollutants, such as chemicals or irritants in the air.  A foreign object stuck in the nose.  A fungal infection. This is rare. What increases the risk? The following factors may make you more likely to develop this condition:  Having allergies or asthma.  Having had a recent cold or respiratory tract infection.  Having structural deformities or blockages in your nose or sinuses.  Having a weak immune system.  Doing a lot of swimming or diving.  Overusing nasal sprays.  Smoking. What are the signs or symptoms? The main symptoms of this condition are pain and a feeling of pressure around the affected sinuses. Other symptoms include:  Upper toothache.  Earache.  Headache.  Bad  breath.  Decreased sense of smell and taste.  A cough that may get worse at night.  Fatigue.  Fever.  Thick drainage from your nose. The drainage is often green and it may contain pus (purulent).  Stuffy nose or congestion.  Postnasal drip. This is when extra mucus collects in the throat or back of the nose.  Swelling and warmth over the affected sinuses.  Sore throat.  Sensitivity to light. How is this diagnosed? This condition is diagnosed based on symptoms, a medical history, and a physical exam. To find out if your condition is acute or chronic, your health care provider may:  Look in your nose for signs of nasal polyps.  Tap over the affected sinus to check for signs of infection.  View the inside of your sinuses using an imaging device that has a light attached (endoscope). If your health care provider suspects that you have chronic sinusitis, you may also:  Be tested for allergies.  Have a sample of mucus taken from your nose (nasal culture) and checked for bacteria.  Have a mucus sample examined to see if your sinusitis is related to an allergy. If your sinusitis does not respond to treatment and it lasts longer than 8 weeks, you may have an MRI or CT scan to check your sinuses. These scans also help to determine how severe your infection is. In rare cases, a bone biopsy may be done to rule out more serious types of fungal sinus disease. How is this treated? Treatment for sinusitis depends on the cause and whether your condition is chronic or acute. If a virus is causing  your sinusitis, your symptoms will go away on their own within 10 days. You may be given medicines to relieve your symptoms, including:  Topical nasal decongestants. They shrink swollen nasal passages and let mucus drain from your sinuses.  Antihistamines. These drugs block inflammation that is triggered by allergies. This can help to ease swelling in your nose and sinuses.  Topical nasal  corticosteroids. These are nasal sprays that ease inflammation and swelling in your nose and sinuses.  Nasal saline washes. These rinses can help to get rid of thick mucus in your nose. If your condition is caused by bacteria, you will be given an antibiotic medicine. If your condition is caused by a fungus, you will be given an antifungal medicine. Surgery may be needed to correct underlying conditions, such as narrow nasal passages. Surgery may also be needed to remove polyps. Follow these instructions at home: Medicines  Take, use, or apply over-the-counter and prescription medicines only as told by your health care provider. These may include nasal sprays.  If you were prescribed an antibiotic medicine, take it as told by your health care provider. Do not stop taking the antibiotic even if you start to feel better. Hydrate and Humidify  Drink enough water to keep your urine clear or pale yellow. Staying hydrated will help to thin your mucus.  Use a cool mist humidifier to keep the humidity level in your home above 50%.  Inhale steam for 10-15 minutes, 3-4 times a day or as told by your health care provider. You can do this in the bathroom while a hot shower is running.  Limit your exposure to cool or dry air. Rest  Rest as much as possible.  Sleep with your head raised (elevated).  Make sure to get enough sleep each night. General instructions  Apply a warm, moist washcloth to your face 3-4 times a day or as told by your health care provider. This will help with discomfort.  Wash your hands often with soap and water to reduce your exposure to viruses and other germs. If soap and water are not available, use hand sanitizer.  Do not smoke. Avoid being around people who are smoking (secondhand smoke).  Keep all follow-up visits as told by your health care provider. This is important. Contact a health care provider if:  You have a fever.  Your symptoms get worse.  Your  symptoms do not improve within 10 days. Get help right away if:  You have a severe headache.  You have persistent vomiting.  You have pain or swelling around your face or eyes.  You have vision problems.  You develop confusion.  Your neck is stiff.  You have trouble breathing. This information is not intended to replace advice given to you by your health care provider. Make sure you discuss any questions you have with your health care provider. Document Released: 08/04/2005 Document Revised: 03/30/2016 Document Reviewed: 05/30/2015 Elsevier Interactive Patient Education  2017 [employer].

## 2016-08-25 NOTE — Assessment & Plan Note (Signed)
minimize simple carbs. Increase exercise as tolerated.  

## 2016-08-25 NOTE — Progress Notes (Signed)
Pre visit review using our clinic review tool, if applicable. No additional management support is needed unless otherwise documented below in the visit note. 

## 2016-08-25 NOTE — Assessment & Plan Note (Signed)
Well controlled, no changes to meds. Encouraged heart healthy diet such as the DASH diet and exercise as tolerated.  °

## 2016-08-25 NOTE — Assessment & Plan Note (Addendum)
Has been ill for 2 weeks now and finally did do an evisit 3 days ago and was started on Augmentin bid, today she feels slightly better. Will continue Augmentin for 2 weeks total. Mucinex bid, Encouraged increased rest and hydration, add probiotics, zinc such as Coldeze or Xicam. Treat fevers as needed. Keep running a humidifier. Depo Medrol 40 mg IM today

## 2016-08-25 NOTE — Progress Notes (Signed)
Patient ID: Megan [name], female   DOB: Sep 19, 1950, 66 y.o.   MRN: [id number]   Subjective:    Patient ID: Megan [name], female    DOB: [birth year], 65 y.o.   MRN: [id number]  Chief Complaint  Patient presents with  . Sinus Problem    HPI Patient is in today for evaluation of sinusitis. She has been sick for 2 weeks now and symptoms are worsening. She reports it started with sore throat then progressed to head congestion and has now moved into her head. She has  Ear pressure, sinus pressure, headache and malaise. Cough is productive of green phlegm. Denies CP/palp/SOB/fevers/GI or GU c/o. Taking meds as prescribed. Using Mucinex which has been marginally helpful  Past Medical History:  Diagnosis Date  . 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 Allergic state 12/31/2012  . Anxiety   . Asthma   . Barrett esophagus   . Blind loop syndrome   . C. difficile diarrhea   . Cancer ([place])   . Chest pain, atypical 07/14/2011  . Cold sore 07/17/2016  . Colon polyp   . Contact dermatitis 03/23/2012  . Cystocele, midline   . Depression   . Diarrhea   . Diverticulosis   . Endometrial hyperplasia 02/27/2006   BENIGN ENDO BX ON 02/2007  . Family history of malignant neoplasm of gastrointestinal tract    multiple members  . GERD (gastroesophageal reflux disease)   . Headache 04/16/2015  . Headache(784.0) 04/15/2012  . Hematuria 04/27/2012  . Hepatic cyst   . Hiatal hernia   . Hiatal hernia   . Hyperlipidemia, mixed 12/17/2014  . Hypertension   . IBS (irritable bowel syndrome)   . Insomnia   . Leaky heart valve   . Low back pain 08/15/2013  . Osteoporosis 05/2011   T score-2.5 Lt femoral neck 2012, T score -2.4 2016 FRAX 12%/2.4%  . 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 Paronychia of great toe, left 06/19/2013  . Preventative health care 09/11/2014  . Rectal bleeding 10/10/2011  . Rectocele   . 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 Sinusitis acute 02/03/2012  .  Thrush 07/21/2011  . 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 Unspecified hypothyroidism   . Unspecified menopausal and postmenopausal disorder   . Uterine prolapse without mention of vaginal wall prolapse   . Vitamin B12 deficiency   . Vitamin D deficiency 07/17/2016    Past Surgical History:  Procedure Laterality Date  . APPENDECTOMY    . HYSTEROSCOPY     POLYP  . PERIPHERAL VASCULAR CATHETERIZATION Left 07/24/2016   Procedure: Renal Angiography;  Surgeon: Conrad [place], MD;  Location: [facility];  Service: Cardiovascular;  Laterality: Left;  . UTERINE FIBROID SURGERY      Family History  Problem Relation Age of Onset  . Colon cancer Mother   . Diabetes Mother   . Hypertension Mother   . Colon polyps Father   . Parkinson's disease Father   . Colon cancer Maternal Grandmother   . Breast cancer Maternal Grandmother 43  . Diabetes Paternal Grandmother   . Breast cancer Paternal Grandmother 48  . Allergies Sister   . Arthritis Brother     b/l hip replace  . Alcohol abuse Daughter   . Arthritis Son   . Hypertension Son   . Allergies Son   . Osteoporosis Son   . Osteoporosis Son   . Arthritis Son     back disease, DDD  . Allergies  Son   . Arthritis Son   . Irritable bowel syndrome Son     RSD  . Hypertension Paternal Grandfather   . Heart disease Paternal Grandfather   . Aneurysm Paternal Grandfather   . Colon cancer Maternal Aunt   . Colon cancer Cousin     X3  . Ovarian cancer Cousin     Social History   Social History  . Marital status: Married    Spouse name: N/A  . Number of children: 4  . Years of education: N/A   Occupational History  . Retired    Social History Main Topics  . Smoking status: Former Smoker    Quit date: 08/19/1995  . Smokeless tobacco: Never Used  . Alcohol use No  . Drug use: No  . Sexual activity: Not Currently    Birth control/ protection: Post-menopausal     Comment: 1st intercourse 70 yo-5 partners   Other Topics  Concern  . Not on file   Social History Narrative  . No narrative on file    Outpatient Medications Prior to Visit  Medication Sig Dispense Refill  . alprazolam (XANAX) 2 MG tablet Take 1 tablet (2 mg total) by mouth 2 (two) times daily as needed for anxiety. 60 tablet 5  . aspirin EC 81 MG tablet Take 1 tablet (81 mg total) by mouth daily.    Marland Kitchen azelastine (ASTELIN) 0.1 % nasal spray Place 2 sprays into both nostrils 2 (two) times daily. Use in each nostril as directed (Patient taking differently: Place 2 sprays into both nostrils 2 (two) times daily as needed for rhinitis. Use in each nostril as directed) 30 mL 12  . carvedilol (COREG) 6.25 MG tablet Take 1 tablet (6.25 mg total) by mouth 2 (two) times daily with a meal. 270 tablet 3  . cholecalciferol (VITAMIN D) 1000 units tablet Take 1,000 Units by mouth daily.    . cyclobenzaprine (FLEXERIL) 10 MG tablet Take 1 tablet (10 mg total) by mouth 3 (three) times daily as needed for muscle spasms. 40 tablet 1  . diphenhydramine-acetaminophen (TYLENOL PM) 25-500 MG TABS tablet Take 2 tablets by mouth at bedtime as needed (for sleep).     . fexofenadine (ALLEGRA ALLERGY) 180 MG tablet Take 1 tablet (180 mg total) by mouth 2 (two) times daily as needed for allergies or rhinitis.    Marland Kitchen FLUoxetine (PROZAC) 20 MG tablet Take 0.5-1 tablets (10-20 mg total) by mouth daily. (Patient taking differently: Take 20 mg by mouth daily. ) 30 tablet 2  . fluticasone (FLONASE) 50 MCG/ACT nasal spray Place 2 sprays into both nostrils daily. (Patient taking differently: Place 2 sprays into both nostrils daily as needed (congestion). ) 48 g 3  . HYDROcodone-acetaminophen (NORCO) 10-325 MG tablet Take 1 tablet by mouth every 8 (eight) hours as needed. 65 tablet 0  . hyoscyamine (LEVBID) 0.375 MG 12 hr tablet Take 1 tablet (0.375 mg total) by mouth 3 (three) times daily as needed. 90 tablet 1  . nitroGLYCERIN (NITROSTAT) 0.4 MG SL tablet DISSOLVE 1 TABLET UNDER TONGUE  EVERY 5 MINUTES AS NEEDED FOR CHEST PAIN 300 tablet 1  . ondansetron (ZOFRAN) 4 MG tablet Take 4 mg by mouth 2 (two) times daily as needed for nausea/vomiting.    . ondansetron (ZOFRAN) 4 MG tablet TAKE 1 TABLET(4 MG) BY MOUTH EVERY 8 HOURS AS NEEDED FOR NAUSEA OR VOMITING 20 tablet 0  . pantoprazole (PROTONIX) 40 MG tablet Take 1 tablet (40 mg total) by mouth  daily. 90 tablet 1  . Probiotic CAPS Take 1 capsule by mouth daily.    . promethazine (PHENERGAN) 25 MG tablet TAKE 1 TABLET BY MOUTH THREE TIMES DAILY AS NEEDED FOR NAUSEA 30 tablet 0  . ranitidine (ZANTAC) 300 MG tablet Take 1 tablet (300 mg total) by mouth at bedtime. 90 tablet 1  . sucralfate (CARAFATE) 1 g tablet Take 1 tablet (1 g total) by mouth 4 (four) times daily -  with meals and at bedtime. (Patient taking differently: Take 1 g by mouth 3 (three) times daily as needed (heartburn). ) 368 tablet 0  . zolpidem (AMBIEN) 5 MG tablet Take 1 tablet (5 mg total) by mouth at bedtime as needed for sleep. 30 tablet 5  . albuterol (VENTOLIN HFA) 108 (90 Base) MCG/ACT inhaler Inhale 2 puffs into the lungs every 6 (six) hours as needed for wheezing or shortness of breath. 18 g 5  . amoxicillin-clavulanate (AUGMENTIN) 875-125 MG tablet Take 1 tablet by mouth 2 (two) times daily. 14 tablet 0  . diltiazem (CARDIZEM CD) 180 MG 24 hr capsule Take 1 capsule (180 mg total) by mouth daily. 90 capsule 3  . traZODone (DESYREL) 50 MG tablet Take 0.5-1 tablets (25-50 mg total) by mouth at bedtime as needed for sleep. (Patient not taking: Reported on 08/25/2016) 90 tablet 1  . acyclovir (ZOVIRAX) 400 MG tablet 1 tab po tid x7D 21 tablet 0   No facility-administered medications prior to visit.     Allergies  Allergen Reactions  . Iodine-131 Other (See Comments)    Other reaction(s): Dizziness (intolerance)  . Sulfamethoxazole Shortness Of Breath    chest tightness  . Amitriptyline Anxiety and Other (See Comments)    Depression, insomnia  . Effexor  [Venlafaxine]     palpitations  . Iohexol Other (See Comments)    Passed out  . Lactose Intolerance (Gi) Diarrhea and Nausea And Vomiting  . Lexapro [Escitalopram Oxalate]     nausea  . Iodinated Diagnostic Agents     Pt states at Dr.Grapeys office 15 years ago blacked out for 2 hours during injection for IVP. Pt says she had dye recently with no problems     Review of Systems  Constitutional: Positive for malaise/fatigue. Negative for fever.  HENT: Positive for congestion, nosebleeds and sinus pain.   Eyes: Negative for blurred vision.  Respiratory: Positive for cough and sputum production.   Cardiovascular: Negative for chest pain and palpitations.  Gastrointestinal: Negative for vomiting.  Musculoskeletal: Negative for back pain.  Skin: Negative for rash.  Neurological: Negative for loss of consciousness and headaches.       Objective:    Physical Exam  Constitutional: She is oriented to person, place, and time. She appears well-developed and well-nourished. No distress.  HENT:  Head: Normocephalic and atraumatic.  Nose: Nose normal.  Nasal mucosa boggy and erythematous. Scant bleeding right nares along anterior septum  Eyes: Right eye exhibits no discharge. Left eye exhibits no discharge.  Neck: Normal range of motion. Neck supple.  Cardiovascular: Normal rate and regular rhythm.   No murmur heard. Pulmonary/Chest: Effort normal and breath sounds normal.  Abdominal: Soft. Bowel sounds are normal. There is no tenderness.  Musculoskeletal: She exhibits no edema.  Neurological: She is alert and oriented to person, place, and time.  Skin: Skin is warm and dry.  Psychiatric: She has a normal mood and affect.  Nursing note and vitals reviewed.   BP 131/65 (BP Location: Left Arm, Patient Position: Sitting,  Cuff Size: Normal)   Pulse 64   Temp 98 F (36.7 C) (Oral)   Wt 165 lb 9.6 oz (75.1 kg)   SpO2 100%   BMI 26.73 kg/m  Wt Readings from Last 3 Encounters:    08/25/16 165 lb 9.6 oz (75.1 kg)  07/24/16 163 lb (73.9 kg)  07/17/16 163 lb 12.8 oz (74.3 kg)     Lab Results  Component Value Date   WBC 8.2 07/17/2016   HGB 13.6 07/24/2016   HCT 40.0 07/24/2016   PLT 320.0 07/17/2016   GLUCOSE 99 07/24/2016   CHOL 218 (H) 07/17/2016   TRIG 115.0 07/17/2016   HDL 49.30 07/17/2016   LDLDIRECT 165.0 06/30/2011   LDLCALC 146 (H) 07/17/2016   ALT 14 07/17/2016   AST 15 07/17/2016   NA 141 07/24/2016   K 3.9 07/24/2016   CL 103 07/24/2016   CREATININE 0.60 07/24/2016   BUN 6 07/24/2016   CO2 29 07/17/2016   TSH 0.86 05/06/2016   INR 1.06 01/14/2016   HGBA1C 5.6 07/17/2016    Lab Results  Component Value Date   TSH 0.86 05/06/2016   Lab Results  Component Value Date   WBC 8.2 07/17/2016   HGB 13.6 07/24/2016   HCT 40.0 07/24/2016   MCV 93.4 07/17/2016   PLT 320.0 07/17/2016   Lab Results  Component Value Date   NA 141 07/24/2016   K 3.9 07/24/2016   CO2 29 07/17/2016   GLUCOSE 99 07/24/2016   BUN 6 07/24/2016   CREATININE 0.60 07/24/2016   BILITOT 0.3 07/17/2016   ALKPHOS 100 07/17/2016   AST 15 07/17/2016   ALT 14 07/17/2016   PROT 7.4 07/17/2016   ALBUMIN 4.2 07/17/2016   CALCIUM 9.6 07/17/2016   ANIONGAP 6 01/14/2016   GFR 93.59 07/17/2016   Lab Results  Component Value Date   CHOL 218 (H) 07/17/2016   Lab Results  Component Value Date   HDL 49.30 07/17/2016   Lab Results  Component Value Date   LDLCALC 146 (H) 07/17/2016   Lab Results  Component Value Date   TRIG 115.0 07/17/2016   Lab Results  Component Value Date   CHOLHDL 4 07/17/2016   Lab Results  Component Value Date   HGBA1C 5.6 07/17/2016       Assessment & Plan:   Problem List Items Addressed This Visit    Essential hypertension    Well controlled, no changes to meds. Encouraged heart healthy diet such as the DASH diet and exercise as tolerated.       Relevant Medications   diltiazem (CARDIZEM CD) 180 MG 24 hr capsule    Hyperglycemia     minimize simple carbs. Increase exercise as tolerated.       Sinusitis    Has been ill for 2 weeks now and finally did do an evisit 3 days ago and was started on Augmentin bid, today she feels slightly better. Will continue Augmentin for 2 weeks total. Mucinex bid, Encouraged increased rest and hydration, add probiotics, zinc such as Coldeze or Xicam. Treat fevers as needed. Keep running a humidifier. Depo Medrol 40 mg IM today      Relevant Medications   amoxicillin-clavulanate (AUGMENTIN) 875-125 MG tablet   methylPREDNISolone acetate (DEPO-MEDROL) injection 40 mg (Start on 08/25/2016 10:45 AM)      I have discontinued Ms. Lippmann's acyclovir. I am also having her start on mupirocin ointment. Additionally, I am having her maintain her cyclobenzaprine, diphenhydramine-acetaminophen, fexofenadine, hyoscyamine, azelastine,  FLUoxetine, nitroGLYCERIN, aspirin EC, alprazolam, fluticasone, pantoprazole, sucralfate, traZODone, ranitidine, zolpidem, carvedilol, cholecalciferol, Probiotic, ondansetron, ondansetron, promethazine, HYDROcodone-acetaminophen, albuterol, diltiazem, and amoxicillin-clavulanate. We will continue to administer methylPREDNISolone acetate.  Meds ordered this encounter  Medications  . albuterol (VENTOLIN HFA) 108 (90 Base) MCG/ACT inhaler    Sig: Inhale 2 puffs into the lungs every 6 (six) hours as needed for wheezing or shortness of breath.    Dispense:  18 g    Refill:  5  . diltiazem (CARDIZEM CD) 180 MG 24 hr capsule    Sig: Take 1 capsule (180 mg total) by mouth daily.    Dispense:  90 capsule    Refill:  3  . amoxicillin-clavulanate (AUGMENTIN) 875-125 MG tablet    Sig: Take 1 tablet by mouth 2 (two) times daily.    Dispense:  14 tablet    Refill:  0  . mupirocin ointment (BACTROBAN) 2 %    Sig: Place 1 application into the nose at bedtime.    Dispense:  22 g    Refill:  0  . methylPREDNISolone acetate (DEPO-MEDROL) injection 40 mg      Penni Homans, MD

## 2016-08-26 ENCOUNTER — Encounter: Payer: Self-pay | Admitting: Family Medicine

## 2016-08-26 DIAGNOSIS — Z79891 Long term (current) use of opiate analgesic: Secondary | ICD-10-CM | POA: Diagnosis not present

## 2016-08-26 DIAGNOSIS — Z79899 Other long term (current) drug therapy: Secondary | ICD-10-CM | POA: Diagnosis not present

## 2016-08-29 ENCOUNTER — Telehealth: Payer: Self-pay

## 2016-08-29 ENCOUNTER — Encounter: Payer: Self-pay | Admitting: Family Medicine

## 2016-08-29 NOTE — Telephone Encounter (Signed)
Per Dr. Charlett Blake no more controlled substances will be prescribed.   PC

## 2016-08-29 NOTE — Telephone Encounter (Signed)
Patient has been notified  PC

## 2016-09-02 ENCOUNTER — Encounter: Payer: Self-pay | Admitting: Vascular Surgery

## 2016-09-08 ENCOUNTER — Ambulatory Visit: Payer: Commercial Managed Care - HMO | Admitting: Family Medicine

## 2016-09-15 ENCOUNTER — Ambulatory Visit (INDEPENDENT_AMBULATORY_CARE_PROVIDER_SITE_OTHER): Payer: Medicare HMO | Admitting: Cardiovascular Disease

## 2016-09-15 ENCOUNTER — Encounter: Payer: Self-pay | Admitting: Cardiovascular Disease

## 2016-09-15 VITALS — BP 150/68 | HR 72 | Ht 66.0 in | Wt 165.1 lb

## 2016-09-15 DIAGNOSIS — I10 Essential (primary) hypertension: Secondary | ICD-10-CM | POA: Diagnosis not present

## 2016-09-15 DIAGNOSIS — I071 Rheumatic tricuspid insufficiency: Secondary | ICD-10-CM

## 2016-09-15 DIAGNOSIS — E782 Mixed hyperlipidemia: Secondary | ICD-10-CM | POA: Diagnosis not present

## 2016-09-15 NOTE — Progress Notes (Signed)
Cardiology Office Note Date:  09/15/2016   ID:  Lashya, Odenwald June 11, 1951, MRN [id number]  PCP:  Penni Homans, MD  Cardiologist:  Sherren Mocha, MD    Chief Complaint  Patient presents with  . Follow-up     History of Present Illness: Megan [name] is a 66 y.o. female who presents for follow-up evaluation. She's been followed for chest pain, palpitations, moderate tricuspid regurgitation.  She has had a stress echocardiogram that was normal.  Last echo in November 2017 showed normal LF/RV function and mild TR.  The patient is here alone today. She is in pretty good spirits. Admits that she is under a lot of stress as she is at home with her husband who has heart failure. She also spends a lot of time watching the news and worrying about all the things going on in the world. She's had some chest pain underneath the rib cage, unassociated with physical activity. She denies shortness of breath, leg swelling, orthopnea, or PND. She's had no heart palpitations.  Past Medical History:  Diagnosis Date  . 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 Allergic state 12/31/2012  . Anxiety   . Asthma   . Barrett esophagus   . Blind loop syndrome   . C. difficile diarrhea   . Cancer ([place])   . Chest pain, atypical 07/14/2011  . Cold sore 07/17/2016  . Colon polyp   . Contact dermatitis 03/23/2012  . Cystocele, midline   . Depression   . Diarrhea   . Diverticulosis   . Endometrial hyperplasia 02/27/2006   BENIGN ENDO BX ON 02/2007  . Family history of malignant neoplasm of gastrointestinal tract    multiple members  . GERD (gastroesophageal reflux disease)   . Headache 04/16/2015  . Headache(784.0) 04/15/2012  . Hematuria 04/27/2012  . Hepatic cyst   . Hiatal hernia   . Hiatal hernia   . Hyperlipidemia, mixed 12/17/2014  . Hypertension   . IBS (irritable bowel syndrome)   . Insomnia   . Leaky heart valve   . Low back pain 08/15/2013  .  Osteoporosis 05/2011   T score-2.5 Lt femoral neck 2012, T score -2.4 2016 FRAX 12%/2.4%  . 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 Paronychia of great toe, left 06/19/2013  . Preventative health care 09/11/2014  . Rectal bleeding 10/10/2011  . Rectocele   . 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 Sinusitis acute 02/03/2012  . Thrush 07/21/2011  . 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 Unspecified hypothyroidism   . Unspecified menopausal and postmenopausal disorder   . Uterine prolapse without mention of vaginal wall prolapse   . Vitamin B12 deficiency   . Vitamin D deficiency 07/17/2016    Past Surgical History:  Procedure Laterality Date  . APPENDECTOMY    . HYSTEROSCOPY     POLYP  . PERIPHERAL VASCULAR CATHETERIZATION Left 07/24/2016   Procedure: Renal Angiography;  Surgeon: Conrad Blue Sky, MD;  Location: [facility];  Service: Cardiovascular;  Laterality: Left;  . UTERINE FIBROID SURGERY      Current Outpatient Prescriptions  Medication Sig Dispense Refill  . albuterol (VENTOLIN HFA) 108 (90 Base) MCG/ACT inhaler Inhale 2 puffs into the lungs every 6 (six) hours as needed for wheezing or shortness of breath. 18 g 5  . alprazolam (XANAX) 2 MG tablet Take 1 tablet (2 mg total) by mouth 2 (two) times daily as needed for anxiety. 60 tablet 5  . amoxicillin-clavulanate (AUGMENTIN) 875-125  MG tablet Take 1 tablet by mouth 2 (two) times daily. 14 tablet 0  . aspirin EC 81 MG tablet Take 1 tablet (81 mg total) by mouth daily.    Marland Kitchen azelastine (ASTELIN) 0.1 % nasal spray Place 1 spray into both nostrils 2 (two) times daily as needed for rhinitis. Use in each nostril as directed    . carvedilol (COREG) 6.25 MG tablet Take 1 tablet (6.25 mg total) by mouth 2 (two) times daily with a meal. 270 tablet 3  . cholecalciferol (VITAMIN D) 1000 units tablet Take 1,000 Units by mouth daily.    . cyclobenzaprine (FLEXERIL) 10 MG tablet Take 1 tablet (10 mg total) by mouth 3 (three) times daily as needed for  muscle spasms. 40 tablet 1  . diltiazem (CARDIZEM CD) 180 MG 24 hr capsule Take 1 capsule (180 mg total) by mouth daily. 90 capsule 3  . diphenhydramine-acetaminophen (TYLENOL PM) 25-500 MG TABS tablet Take 2 tablets by mouth at bedtime as needed (for sleep).     . fexofenadine (ALLEGRA ALLERGY) 180 MG tablet Take 1 tablet (180 mg total) by mouth 2 (two) times daily as needed for allergies or rhinitis.    . fluticasone (FLONASE) 50 MCG/ACT nasal spray Place 2 sprays into both nostrils daily.    . hyoscyamine (LEVBID) 0.375 MG 12 hr tablet Take 0.375 mg by mouth 3 (three) times daily as needed (colon spasms).    . nitroGLYCERIN (NITROSTAT) 0.4 MG SL tablet DISSOLVE 1 TABLET UNDER TONGUE EVERY 5 MINUTES AS NEEDED FOR CHEST PAIN 300 tablet 1  . ondansetron (ZOFRAN) 4 MG tablet Take 4 mg by mouth 2 (two) times daily as needed for nausea/vomiting.    . pantoprazole (PROTONIX) 40 MG tablet Take 1 tablet (40 mg total) by mouth daily. 90 tablet 1  . Probiotic CAPS Take 1 capsule by mouth daily.    . promethazine (PHENERGAN) 25 MG tablet TAKE 1 TABLET BY MOUTH THREE TIMES DAILY AS NEEDED FOR NAUSEA 30 tablet 0  . ranitidine (ZANTAC) 300 MG tablet Take 1 tablet (300 mg total) by mouth at bedtime. 90 tablet 1  . zolpidem (AMBIEN) 5 MG tablet Take 1 tablet (5 mg total) by mouth at bedtime as needed for sleep. 30 tablet 5   No current facility-administered medications for this visit.     Allergies:   Iodine-131; Sulfamethoxazole; Amitriptyline; Effexor [venlafaxine]; Iohexol; Lactose intolerance (gi); Lexapro [escitalopram oxalate]; and Iodinated diagnostic agents   Social History:  The patient  reports that she quit smoking about 21 years ago. She has never used smokeless tobacco. She reports that she does not drink alcohol or use drugs.   Family History:  The patient's  family history includes Alcohol abuse in her daughter; Allergies in her sister, son, and son; Aneurysm in her paternal grandfather;  Arthritis in her brother, son, son, and son; Breast cancer (age of onset: 21) in her paternal grandmother; Breast cancer (age of onset: 69) in her maternal grandmother; Colon cancer in her cousin, maternal aunt, maternal grandmother, and mother; Colon polyps in her father; Diabetes in her mother and paternal grandmother; Heart disease in her paternal grandfather; Hypertension in her mother, paternal grandfather, and son; Irritable bowel syndrome in her son; Osteoporosis in her son and son; Ovarian cancer in her cousin; Parkinson's disease in her father.    ROS:  Please see the history of present illness.  Otherwise, review of systems is positive for Depression, hearing loss, cough, back pain, muscle pain, night sweats.  All other systems are reviewed and negative.    PHYSICAL EXAM: VS:  BP (!) 150/68   Pulse 72   Ht 5\' 6"  (1.676 m)   Wt 165 lb 1.9 oz (74.9 kg)   BMI 26.65 kg/m  , BMI Body mass index is 26.65 kg/m. GEN: Well nourished, well developed, in no acute distress  HEENT: normal  Neck: no JVD, no masses. No carotid bruits Cardiac: RRR with 2/6 systolic murmur at the LLSB                Respiratory:  clear to auscultation bilaterally, normal work of breathing GI: soft, nontender, nondistended, + BS MS: no deformity or atrophy  Ext: no pretibial edema, pedal pulses 2+= bilaterally Skin: warm and dry, no rash Neuro:  Strength and sensation are intact Psych: euthymic mood, full affect  EKG:  EKG is not ordered today.  Recent Labs: 05/06/2016: TSH 0.86 07/17/2016: ALT 14; Platelets 320.0 07/24/2016: BUN 6; Creatinine, Ser 0.60; Hemoglobin 13.6; Potassium 3.9; Sodium 141   Lipid Panel     Component Value Date/Time   CHOL 218 (H) 07/17/2016 1513   TRIG 115.0 07/17/2016 1513   HDL 49.30 07/17/2016 1513   CHOLHDL 4 07/17/2016 1513   VLDL 23.0 07/17/2016 1513   LDLCALC 146 (H) 07/17/2016 1513   LDLDIRECT 165.0 06/30/2011 1153      Wt Readings from Last 3 Encounters:    09/15/16 165 lb 1.9 oz (74.9 kg)  08/25/16 165 lb 9.6 oz (75.1 kg)  07/24/16 163 lb (73.9 kg)     Cardiac Studies Reviewed: Echo 07-09-2016: Left ventricle:  The cavity size was normal. Wall thickness was increased in a pattern of mild LVH. Systolic function was normal. The estimated ejection fraction was in the range of 60% to 65%. Wall motion was normal; there were no regional wall motion abnormalities. Doppler parameters are consistent with abnormal left ventricular relaxation (grade 1 diastolic dysfunction).  ------------------------------------------------------------------- Aortic valve:   Trileaflet; normal thickness leaflets. Mobility was not restricted.  Doppler:  Transvalvular velocity was within the normal range. There was no stenosis. There was no regurgitation.   ------------------------------------------------------------------- Aorta:  Aortic root: The aortic root was normal in size.  ------------------------------------------------------------------- Mitral valve:   Calcified annulus. Mildly thickened leaflets . Mobility was not restricted.  Doppler:  Transvalvular velocity was within the normal range. There was no evidence for stenosis. There was no regurgitation.    Peak gradient (D): 3 mm Hg.  ------------------------------------------------------------------- Left atrium:  The atrium was mildly dilated.  ------------------------------------------------------------------- Right ventricle:  The cavity size was normal. Wall thickness was normal. Systolic function was normal.  ------------------------------------------------------------------- Pulmonic valve:   Poorly visualized.  Structurally normal valve. Cusp separation was normal.  Doppler:  Transvalvular velocity was within the normal range. There was no evidence for stenosis. There was no regurgitation.  ------------------------------------------------------------------- Tricuspid valve:    Structurally normal valve.    Doppler: Transvalvular velocity was within the normal range. There was mild regurgitation.  ------------------------------------------------------------------- Pulmonary artery:   The main pulmonary artery was normal-sized. Systolic pressure was mildly increased.  ------------------------------------------------------------------- Right atrium:  The atrium was normal in size.  ------------------------------------------------------------------- Pericardium:  There was no pericardial effusion.  ------------------------------------------------------------------- Systemic veins: Inferior vena cava: The vessel was normal in size.  ------------------------------------------------------------------- Measurements   Left ventricle                           Value  Reference  LV ID, ED, PLAX chordal          (L)     40.1  mm     43 - 52  LV ID, ES, PLAX chordal                  25.6  mm     23 - 38  LV fx shortening, PLAX chordal           36    %      >=29  LV PW thickness, ED                      10.8  mm     ---------  IVS/LV PW ratio, ED                      1.12         <=1.3  Stroke volume, 2D                        57    ml     ---------  Stroke volume/bsa, 2D                    31    ml/m^2 ---------  LV ejection fraction, 1-p A4C            67    %      ---------  LV end-diastolic volume, 2-p             54    ml     ---------  LV end-systolic volume, 2-p              18    ml     ---------  LV ejection fraction, 2-p                67    %      ---------  Stroke volume, 2-p                       36    ml     ---------  LV end-diastolic volume/bsa, 2-p         29    ml/m^2 ---------  LV end-systolic volume/bsa, 2-p          10    ml/m^2 ---------  Stroke volume/bsa, 2-p                   19.3  ml/m^2 ---------  LV e&', lateral                           10.3  cm/s   ---------  LV E/e&', lateral                         8.82          ---------  LV e&', medial                            8.38  cm/s   ---------  LV E/e&', medial                          10.84        ---------  LV e&', average  9.34  cm/s   ---------  LV E/e&', average                         9.72         ---------    Ventricular septum                       Value        Reference  IVS thickness, ED                        12.1  mm     ---------    LVOT                                     Value        Reference  LVOT ID, S                               17    mm     ---------  LVOT area                                2.27  cm^2   ---------  LVOT ID                                  17    mm     ---------  LVOT peak velocity, S                    111   cm/s   ---------  LVOT mean velocity, S                    79    cm/s   ---------  LVOT VTI, S                              25.3  cm     ---------  LVOT peak gradient, S                    5     mm Hg  ---------  Stroke volume (SV), LVOT DP              57.4  ml     ---------  Stroke index (SV/bsa), LVOT DP           30.8  ml/m^2 ---------    Aorta                                    Value        Reference  Aortic root ID, ED                       31    mm     ---------  Ascending aorta ID, A-P, S               33    mm     ---------    Left atrium  Value        Reference  LA ID, A-P, ES                           42    mm     ---------  LA ID/bsa, A-P                   (H)     2.25  cm/m^2 <=2.2  LA volume, S                             50    ml     ---------  LA volume/bsa, S                         26.8  ml/m^2 ---------  LA volume, ES, 1-p A4C                   41    ml     ---------  LA volume/bsa, ES, 1-p A4C               22    ml/m^2 ---------  LA volume, ES, 1-p A2C                   57    ml     ---------  LA volume/bsa, ES, 1-p A2C               30.6  ml/m^2 ---------    Mitral valve                             Value        Reference  Mitral  E-wave peak velocity              90.8  cm/s   ---------  Mitral A-wave peak velocity              98.2  cm/s   ---------  Mitral deceleration time         (H)     261   ms     150 - 230  Mitral peak gradient, D                  3     mm Hg  ---------  Mitral E/A ratio, peak                   0.9          ---------    Pulmonary arteries                       Value        Reference  PA pressure, S, DP               (H)     37    mm Hg  <=30    Tricuspid valve                          Value        Reference  Tricuspid regurg peak velocity           290   cm/s   ---------  Tricuspid peak RV-RA gradient  34    mm Hg  ---------    Systemic veins                           Value        Reference  Estimated CVP                            3     mm Hg  ---------    Right ventricle                          Value        Reference  RV pressure, S, DP               (H)     37    mm Hg  <=30  RV s&', lateral, S                        15.4  cm/s   ---------   ASSESSMENT AND PLAN: 1.  Essential HTN: pt will continue same Rx. Lengthy discussion about need for exercise, weight loss, stress relief. I encouraged her to try to get out of her house once/day for a walk.   2. Chest pain, atypical. Recent Myoview scan normal. Continue medical therapy.   3. Tricuspid regurgitation, mild by recent echo. Has been mild-moderate in the past. Repeat 2D echo next year.   Current medicines are reviewed with the patient today.  The patient does not have concerns regarding medicines.  Labs/ tests ordered today include:  No orders of the defined types were placed in this encounter.   Disposition:   FU one year with an echo prior to the visit  Signed, Sherren Mocha, MD  09/15/2016 3:23 PM    [facility] [address], [place], [place]  [address] Phone: [phone]; Fax: [phone]

## 2016-09-15 NOTE — Patient Instructions (Signed)
Medication Instructions:  Your physician recommends that you continue on your current medications as directed. Please refer to the Current Medication list given to you today.  Labwork: No new orders.   Testing/Procedures: Your physician has requested that you have an echocardiogram in 1 YEAR. Echocardiography is a painless test that uses sound waves to create images of your heart. It provides your doctor with information about the size and shape of your heart and how well your heart's chambers and valves are working. This procedure takes approximately one hour. There are no restrictions for this procedure.  Follow-Up: Your physician wants you to follow-up in: 1 YEAR with Dr Cooper.  You will receive a reminder letter in the mail two months in advance. If you don't receive a letter, please call our office to schedule the follow-up appointment.   Any Other Special Instructions Will Be Listed Below (If Applicable).     If you need a refill on your cardiac medications before your next appointment, please call your pharmacy.   

## 2016-09-22 ENCOUNTER — Telehealth: Payer: Self-pay | Admitting: *Deleted

## 2016-09-22 DIAGNOSIS — I10 Essential (primary) hypertension: Secondary | ICD-10-CM

## 2016-09-22 DIAGNOSIS — R002 Palpitations: Secondary | ICD-10-CM

## 2016-09-22 NOTE — Telephone Encounter (Signed)
I will forward this message to Dr Burt Knack for review and advisement about whether the pt can increase Carvedilol to 12.5mg  twice a day.

## 2016-09-22 NOTE — Telephone Encounter (Signed)
Patient left a msg on the refill vm stating that she has increased her carvedilol dose back to 12.5 mg bid and requested a refill be sent to [facility]. Patient can be reached at [phone]. Please advise. Thanks, MI

## 2016-09-23 MED ORDER — CARVEDILOL 12.5 MG PO TABS: 12.5000 mg | ORAL_TABLET | Freq: Two times a day (BID) | ORAL | 3 refills | Status: DC

## 2016-09-23 NOTE — Telephone Encounter (Signed)
That's fine. thanks

## 2016-09-23 NOTE — Telephone Encounter (Signed)
Rx sent to the pharmacy per pt's request.  I left her a voicemail that dosage was changed to carvedilol 12.5mg  twice a day.

## 2016-09-24 ENCOUNTER — Encounter: Payer: Self-pay | Admitting: Family Medicine

## 2016-09-24 MED ORDER — DILTIAZEM HCL ER COATED BEADS 180 MG PO CP24: 180.0000 mg | ORAL_CAPSULE | Freq: Every day | ORAL | 3 refills | Status: DC

## 2016-10-08 ENCOUNTER — Encounter: Payer: Self-pay | Admitting: Family Medicine

## 2016-10-08 ENCOUNTER — Other Ambulatory Visit: Payer: Self-pay | Admitting: Family Medicine

## 2016-10-08 MED ORDER — CIPROFLOXACIN HCL 500 MG PO TABS: 500.0000 mg | ORAL_TABLET | Freq: Two times a day (BID) | ORAL | 0 refills | Status: DC

## 2016-10-08 MED ORDER — METRONIDAZOLE 500 MG PO TABS: 500.0000 mg | ORAL_TABLET | Freq: Three times a day (TID) | ORAL | 0 refills | Status: DC

## 2016-10-20 ENCOUNTER — Other Ambulatory Visit: Payer: Self-pay | Admitting: Family Medicine

## 2016-10-20 MED ORDER — PROMETHAZINE HCL 25 MG PO TABS: 25.0000 mg | ORAL_TABLET | Freq: Three times a day (TID) | ORAL | 0 refills | Status: DC | PRN

## 2016-10-28 ENCOUNTER — Encounter: Payer: Self-pay | Admitting: Gynecology

## 2016-10-28 ENCOUNTER — Ambulatory Visit (INDEPENDENT_AMBULATORY_CARE_PROVIDER_SITE_OTHER): Payer: Medicare HMO | Admitting: Gynecology

## 2016-10-28 VITALS — BP 140/82

## 2016-10-28 DIAGNOSIS — N898 Other specified noninflammatory disorders of vagina: Secondary | ICD-10-CM

## 2016-10-28 MED ORDER — BETAMETHASONE DIPROPIONATE AUG 0.05 % EX CREA: TOPICAL_CREAM | Freq: Two times a day (BID) | CUTANEOUS | 0 refills | Status: DC

## 2016-10-28 MED ORDER — FLUCONAZOLE 150 MG PO TABS: 150.0000 mg | ORAL_TABLET | Freq: Once | ORAL | 0 refills | Status: AC

## 2016-10-28 NOTE — Patient Instructions (Signed)
Take the fluconazole pill daily for 5 days.  Use the steroid cream on the outside of the vagina twice daily.  Follow up if the symptoms persist or worsen.

## 2016-10-28 NOTE — Progress Notes (Signed)
    Megan [name] 1951/03/06 [id number]        65 y.o.  [id number] presents with one-month history of vaginal irritation. Thought she had a yeast infection and used an OTC antifungal. The irritation has continued and she tried vinegar which caused a lot more irritation. No significant discharge, odor or urinary symptoms such as frequency dysuria or urgency low back pain fever or chills.  Past medical history,surgical history, problem list, medications, allergies, family history and social history were all reviewed and documented in the EPIC chart.  Directed ROS with pertinent positives and negatives documented in the history of present illness/assessment and plan.  Exam: Copywriter, advertising Vitals:   10/28/16 1051  BP: 140/82   General appearance:  Normal Abdomen soft nontender without masses guarding rebound Pelvic external BUS vagina with linear cracking of the skin upper crease bilaterally between labia majora and labia minora. No specific lesions were pigmented changes. First to second-degree cystocele. Mild uterine prolapse. First-degree rectocele.  Cervix atrophic. Uterus grossly normal size midline mobile nontender. Adnexa without masses or tenderness.  Assessment/Plan:  66 y.o. [id number] with irritative changes in her labial full bilaterally. Suspect low-grade yeast with subsequent irritation. Will cover with Diflucan 150 mg daily 5 days. Diprolene 0.05% cream twice daily 30 g supply follow up if symptoms persist, worsen or recur.    Anastasio Auerbach MD, 11:06 AM 10/28/2016

## 2016-11-03 ENCOUNTER — Telehealth: Payer: Self-pay | Admitting: Family Medicine

## 2016-11-03 NOTE — Telephone Encounter (Signed)
Pt would like to transfer care from Dr. Charlett Blake to [facility] ([facility]), Please advise ok to schedule.

## 2016-11-03 NOTE — Telephone Encounter (Signed)
OK with me.

## 2016-11-03 NOTE — Telephone Encounter (Signed)
[place] with me if PCP ok's this.

## 2016-11-04 ENCOUNTER — Ambulatory Visit (INDEPENDENT_AMBULATORY_CARE_PROVIDER_SITE_OTHER): Payer: Medicare HMO | Admitting: Physician Assistant

## 2016-11-04 ENCOUNTER — Encounter: Payer: Self-pay | Admitting: Physician Assistant

## 2016-11-04 VITALS — BP 128/62 | HR 74 | Temp 97.9°F | Resp 14 | Ht 66.0 in | Wt 167.0 lb

## 2016-11-04 DIAGNOSIS — J019 Acute sinusitis, unspecified: Secondary | ICD-10-CM | POA: Diagnosis not present

## 2016-11-04 DIAGNOSIS — B9689 Other specified bacterial agents as the cause of diseases classified elsewhere: Secondary | ICD-10-CM

## 2016-11-04 MED ORDER — DOXYCYCLINE HYCLATE 100 MG PO CAPS: 100.0000 mg | ORAL_CAPSULE | Freq: Two times a day (BID) | ORAL | 0 refills | Status: DC

## 2016-11-04 MED ORDER — BENZONATATE 100 MG PO CAPS: 100.0000 mg | ORAL_CAPSULE | Freq: Two times a day (BID) | ORAL | 0 refills | Status: DC | PRN

## 2016-11-04 NOTE — Telephone Encounter (Signed)
Pt has been scheduled with Megan [name] °

## 2016-11-04 NOTE — Progress Notes (Signed)
t c/o cough x 5 days. Pt states it began with hoarseness that just resolved yesterday. Notes post nasal drainage and cough is productive with yellow-brown sputum. Admits sinus pain and pressure that she states makes it hard to open her eyes from stinging. States that sinus pressure/pain is worsening, and this began longer than a week ago. Admits runny nose, ear pain (began yesterday - shooting pain), hearing difficulties, ear pressure. Tried Tylenol and hot tea with minimal relief. Pt states that cough is worse at night. Denies sore throat, chest pain, palpitations, dizziness, LOC.   Past Medical History:  Diagnosis Date  . 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 Allergic state 12/31/2012  . Anxiety   . Asthma   . Barrett esophagus   . Blind loop syndrome   . C. difficile diarrhea   . Cancer ([place])   . Chest pain, atypical 07/14/2011  . Cold sore 07/17/2016  . Colon polyp   . Contact dermatitis 03/23/2012  . Cystocele, midline   . Depression   . Diarrhea   . Diverticulosis   . Endometrial hyperplasia 02/27/2006   BENIGN ENDO BX ON 02/2007  . Family history of malignant neoplasm of gastrointestinal tract    multiple members  . GERD (gastroesophageal reflux disease)   . Headache 04/16/2015  . Headache(784.0) 04/15/2012  . Hematuria 04/27/2012  . Hepatic cyst   . Hiatal hernia   . Hiatal hernia   . Hyperlipidemia, mixed 12/17/2014  . Hypertension   . IBS (irritable bowel syndrome)   . Insomnia   . Leaky heart valve   . Low back pain 08/15/2013  . Osteoporosis 05/2011   T score-2.5 Lt femoral neck 2012, T score -2.4 2016 FRAX 12%/2.4%  . 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 Paronychia of great toe, left 06/19/2013  . Preventative health care 09/11/2014  . Rectal bleeding 10/10/2011  . Rectocele   . 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 Sinusitis acute 02/03/2012  . Thrush 07/21/2011  . 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 Unspecified hypothyroidism   . Unspecified  menopausal and postmenopausal disorder   . Uterine prolapse without mention of vaginal wall prolapse   . Vitamin B12 deficiency   . Vitamin D deficiency 07/17/2016    Current Outpatient Prescriptions on File Prior to Visit  Medication Sig Dispense Refill  . albuterol (VENTOLIN HFA) 108 (90 Base) MCG/ACT inhaler Inhale 2 puffs into the lungs every 6 (six) hours as needed for wheezing or shortness of breath. 18 g 5  . aspirin EC 81 MG tablet Take 1 tablet (81 mg total) by mouth daily.    Marland Kitchen augmented betamethasone dipropionate (DIPROLENE AF) 0.05 % cream Apply topically 2 (two) times daily. 30 g 0  . azelastine (ASTELIN) 0.1 % nasal spray Place 1 spray into both nostrils 2 (two) times daily as needed for rhinitis. Use in each nostril as directed    . carvedilol (COREG) 12.5 MG tablet Take 1 tablet (12.5 mg total) by mouth 2 (two) times daily with a meal. 180 tablet 3  . diltiazem (CARDIZEM CD) 180 MG 24 hr capsule Take 1 capsule (180 mg total) by mouth daily. 90 capsule 3  . fluticasone (FLONASE) 50 MCG/ACT nasal spray Place 2 sprays into both nostrils daily.    . hyoscyamine (LEVBID) 0.375 MG 12 hr tablet Take 0.375 mg by mouth 3 (three) times daily as needed (colon spasms).    . ondansetron (ZOFRAN) 4 MG tablet Take 4 mg by mouth  2 (two) times daily as needed for nausea/vomiting.    . pantoprazole (PROTONIX) 40 MG tablet Take 1 tablet (40 mg total) by mouth daily. 90 tablet 1  . Probiotic CAPS Take 1 capsule by mouth daily.    . promethazine (PHENERGAN) 25 MG tablet Take 1 tablet (25 mg total) by mouth 3 (three) times daily as needed for nausea or vomiting. 30 tablet 0  . ranitidine (ZANTAC) 300 MG tablet Take 1 tablet (300 mg total) by mouth at bedtime. 90 tablet 1   No current facility-administered medications on file prior to visit.     Allergies  Allergen Reactions  . Iodine-131 Other (See Comments)    Other reaction(s): Dizziness (intolerance)  . Sulfamethoxazole Shortness Of Breath      chest tightness  . Amitriptyline Anxiety and Other (See Comments)    Depression, insomnia  . Effexor [Venlafaxine]     palpitations  . Iohexol Other (See Comments)    Passed out  . Lactose Intolerance (Gi) Diarrhea and Nausea And Vomiting  . Lexapro [Escitalopram Oxalate]     nausea  . Iodinated Diagnostic Agents     Pt states at Dr.Grapeys office 15 years ago blacked out for 2 hours during injection for IVP. Pt says she had dye recently with no problems     Family History  Problem Relation Age of Onset  . Colon cancer Mother   . Diabetes Mother   . Hypertension Mother   . Colon polyps Father   . Parkinson's disease Father   . Colon cancer Maternal Grandmother   . Breast cancer Maternal Grandmother 33  . Diabetes Paternal Grandmother   . Breast cancer Paternal Grandmother 77  . Allergies Sister   . Arthritis Brother     b/l hip replace  . Alcohol abuse Daughter   . Arthritis Son   . Hypertension Son   . Allergies Son   . Osteoporosis Son   . Osteoporosis Son   . Arthritis Son     back disease, DDD  . Allergies Son   . Arthritis Son   . Irritable bowel syndrome Son     RSD  . Hypertension Paternal Grandfather   . Heart disease Paternal Grandfather   . Aneurysm Paternal Grandfather   . Colon cancer Maternal Aunt   . Colon cancer Cousin     X3  . Ovarian cancer Cousin     Social History   Social History  . Marital status: Married    Spouse name: N/A  . Number of children: 4  . Years of education: N/A   Occupational History  . Retired    Social History Main Topics  . Smoking status: Former Smoker    Quit date: 08/19/1995  . Smokeless tobacco: Never Used  . Alcohol use No  . Drug use: No  . Sexual activity: Not Currently    Birth control/ protection: Post-menopausal     Comment: 1st intercourse 74 yo-5 partners   Other Topics Concern  . None   Social History Narrative  . None   Review of Systems - See HPI.  All other ROS are negative.  BP  128/62   Pulse 74   Temp 97.9 F (36.6 C) (Oral)   Resp 14   Ht 5\' 6"  (1.676 m)   Wt 167 lb (75.8 kg)   SpO2 100%   BMI 26.95 kg/m   Physical Exam  Constitutional: She is oriented to person, place, and time and well-developed, well-nourished, and in no  distress. No distress.  HENT:  Right Ear: External ear normal.  Left Ear: External ear normal. Tympanic membrane is not erythematous.  Nose: Mucosal edema (with erythema) present. Right sinus exhibits maxillary sinus tenderness and frontal sinus tenderness. Left sinus exhibits maxillary sinus tenderness and frontal sinus tenderness.  Mouth/Throat: Oropharynx is clear and moist. No oropharyngeal exudate, posterior oropharyngeal edema or posterior oropharyngeal erythema.  Scant fluid seen behind left TM. Unstable to visualize right TM due to cerumen.  Eyes: Conjunctivae are normal. Pupils are equal, round, and reactive to light. No scleral icterus.  Neck: Neck supple.  Cardiovascular: Normal rate and regular rhythm.   Murmur heard.  Systolic murmur is present with a grade of 2/6  Pulmonary/Chest: Effort normal and breath sounds normal. No respiratory distress. She has no wheezes. She has no rales.  Lymphadenopathy:    She has no cervical adenopathy.  Neurological: She is alert and oriented to person, place, and time.  Skin: Skin is warm and dry. She is not diaphoretic.  Psychiatric: Affect normal.   Assessment/Plan: 1. Acute bacterial sinusitis Rx Doxycycline.  Increase fluids.  Rest.  Saline nasal spray.  Probiotic.  Mucinex as directed.  Humidifier in bedroom. Tessalon as directed.  Call or return to clinic if symptoms are not improving.  - doxycycline (VIBRAMYCIN) 100 MG capsule; Take 1 capsule (100 mg total) by mouth 2 (two) times daily.  Dispense: 20 capsule; Refill: 0 - benzonatate (TESSALON) 100 MG capsule; Take 1 capsule (100 mg total) by mouth 2 (two) times daily as needed for cough.  Dispense: 20 capsule; Refill:  0   Leeanne Rio, [place]

## 2016-11-04 NOTE — Progress Notes (Signed)
Pre visit review using our clinic review tool, if applicable. No additional management support is needed unless otherwise documented below in the visit note. 

## 2016-11-04 NOTE — Patient Instructions (Signed)
Please take antibiotic as directed.  Increase fluid intake.  Use Saline nasal spray.  Take a daily multivitamin. Tessalon as directed for cough.  Place a humidifier in the bedroom.  Please call or return clinic if symptoms are not improving.  Sinusitis Sinusitis is redness, soreness, and swelling (inflammation) of the paranasal sinuses. Paranasal sinuses are air pockets within the bones of your face (beneath the eyes, the middle of the forehead, or above the eyes). In healthy paranasal sinuses, mucus is able to drain out, and air is able to circulate through them by way of your nose. However, when your paranasal sinuses are inflamed, mucus and air can become trapped. This can allow bacteria and other germs to grow and cause infection. Sinusitis can develop quickly and last only a short time (acute) or continue over a long period (chronic). Sinusitis that lasts for more than 12 weeks is considered chronic.  CAUSES  Causes of sinusitis include:  Allergies.  Structural abnormalities, such as displacement of the cartilage that separates your nostrils (deviated septum), which can decrease the air flow through your nose and sinuses and affect sinus drainage.  Functional abnormalities, such as when the small hairs (cilia) that line your sinuses and help remove mucus do not work properly or are not present. SYMPTOMS  Symptoms of acute and chronic sinusitis are the same. The primary symptoms are pain and pressure around the affected sinuses. Other symptoms include:  Upper toothache.  Earache.  Headache.  Bad breath.  Decreased sense of smell and taste.  A cough, which worsens when you are lying flat.  Fatigue.  Fever.  Thick drainage from your nose, which often is green and may contain pus (purulent).  Swelling and warmth over the affected sinuses. DIAGNOSIS  Your caregiver will perform a physical exam. During the exam, your caregiver may:  Look in your nose for signs of abnormal growths  in your nostrils (nasal polyps).  Tap over the affected sinus to check for signs of infection.  View the inside of your sinuses (endoscopy) with a special imaging device with a light attached (endoscope), which is inserted into your sinuses. If your caregiver suspects that you have chronic sinusitis, one or more of the following tests may be recommended:  Allergy tests.  Nasal culture A sample of mucus is taken from your nose and sent to a lab and screened for bacteria.  Nasal cytology A sample of mucus is taken from your nose and examined by your caregiver to determine if your sinusitis is related to an allergy. TREATMENT  Most cases of acute sinusitis are related to a viral infection and will resolve on their own within 10 days. Sometimes medicines are prescribed to help relieve symptoms (pain medicine, decongestants, nasal steroid sprays, or saline sprays).  However, for sinusitis related to a bacterial infection, your caregiver will prescribe antibiotic medicines. These are medicines that will help kill the bacteria causing the infection.  Rarely, sinusitis is caused by a fungal infection. In theses cases, your caregiver will prescribe antifungal medicine. For some cases of chronic sinusitis, surgery is needed. Generally, these are cases in which sinusitis recurs more than 3 times per year, despite other treatments. HOME CARE INSTRUCTIONS   Drink plenty of water. Water helps thin the mucus so your sinuses can drain more easily.  Use a humidifier.  Inhale steam 3 to 4 times a day (for example, sit in the bathroom with the shower running).  Apply a warm, moist washcloth to your face 3   to 4 times a day, or as directed by your caregiver.  Use saline nasal sprays to help moisten and clean your sinuses.  Take over-the-counter or prescription medicines for pain, discomfort, or fever only as directed by your caregiver. SEEK IMMEDIATE MEDICAL CARE IF:  You have increasing pain or severe  headaches.  You have nausea, vomiting, or drowsiness.  You have swelling around your face.  You have vision problems.  You have a stiff neck.  You have difficulty breathing. MAKE SURE YOU:   Understand these instructions.  Will watch your condition.  Will get help right away if you are not doing well or get worse. Document Released: 08/04/2005 Document Revised: 10/27/2011 Document Reviewed: 08/19/2011 [facility] Patient Information 2014 [place], [place].

## 2016-11-05 ENCOUNTER — Telehealth: Payer: Self-pay | Admitting: Physician Assistant

## 2016-11-05 NOTE — Telephone Encounter (Signed)
Patient Name: Megan [name] DOB: [birth year] Initial Comment Caller states wife starts bleeding when she uses saline to flush out her sinuses and wants to know if she can use something else. Dr Burnetta Sabin Nurse Assessment Nurse: Zorita Pang, RN, Neoma Laming Date/Time Eilene Ghazi Time): 11/05/2016 12:05:12 PM Confirm and document reason for call. If symptomatic, describe symptoms. ---Caller states that she was seen yesterday and diagnosed with a sinus infection and was put on Doxycycline and was told to use saline flush and that makes her nose bleed. She also states that Flonase is also burning. She has a lot of thick brown mucous. Does the patient have any new or worsening symptoms? ---Yes Will a triage be completed? ---Yes Related visit to physician within the last 2 weeks? ---Yes Does the PT have any chronic conditions? (i.e. diabetes, asthma, etc.) ---Yes List chronic conditions. ---intestinal diseases Is this a behavioral health or substance abuse call? ---No Guidelines Guideline Title Affirmed Question Affirmed Notes Sinus Pain or Congestion [1] Sinus congestion as part of a cold AND [2] present < 10 days (all triage questions negative) Final Disposition User [facility] [place], RN, Neoma Laming Comments The caller states that she saw the MD yesterday and that he told her to continue using the saline washes and the Flonase. She states that it is burning and making her nose bleed and she wanted to know if there was anything else that she could use. She is using the commercially made saline. She is using a humidifier and is applying ice to her head for the headache. The MD did order her doxycycline. The caller stated that she was going to go to the drug store and this nurse encouraged her to speak with her pharmacist and see if there was anything else available. The nurse discovered that the caller does have hypertension so she was told to be very cautious as some over the counter  medications can raise the BP and she verbalizes understanding. Disagree/Comply: Comply

## 2016-11-05 NOTE — Telephone Encounter (Signed)
Spoke with patient regarding symptoms. Reports irritated and bleeding nares when using nasal spray. Per PCP, advised patient to stop saline nasal spray, continue antibiotics and use of humidifier. Advised to apply Vaseline inside nares with qtip. Patient verbalized understanding, will call for follow-up appointment if no improvement.

## 2016-11-05 NOTE — Telephone Encounter (Signed)
FYI,

## 2016-11-05 NOTE — Telephone Encounter (Signed)
Stop saline nasal spray then if irritative. Keep humidifier in bedroom. Apply small amount of Vaseline with a qtip inside the nares to help moisten the area and protect the friable tissue there. Continue antibiotic as directed.  If not improving can follow-up with me in office.

## 2016-11-07 ENCOUNTER — Encounter: Payer: Self-pay | Admitting: Physician Assistant

## 2016-11-07 MED ORDER — AMOXICILLIN-POT CLAVULANATE 875-125 MG PO TABS: 1.0000 | ORAL_TABLET | Freq: Two times a day (BID) | ORAL | 0 refills | Status: DC

## 2016-11-10 ENCOUNTER — Encounter: Payer: Self-pay | Admitting: Family Medicine

## 2016-11-10 ENCOUNTER — Other Ambulatory Visit: Payer: Self-pay | Admitting: Family Medicine

## 2016-11-10 DIAGNOSIS — R1013 Epigastric pain: Principal | ICD-10-CM

## 2016-11-10 DIAGNOSIS — G8929 Other chronic pain: Secondary | ICD-10-CM

## 2016-11-10 NOTE — Telephone Encounter (Signed)
Not on current list 

## 2016-11-12 ENCOUNTER — Telehealth: Payer: Self-pay | Admitting: Physician Assistant

## 2016-11-12 NOTE — Telephone Encounter (Signed)
Faxed completed PA  For Ventolin to [facility] at [phone] Awaiting response.

## 2016-11-14 ENCOUNTER — Encounter: Payer: Self-pay | Admitting: Physician Assistant

## 2016-11-17 ENCOUNTER — Other Ambulatory Visit: Payer: Self-pay

## 2016-11-17 MED ORDER — DILTIAZEM HCL ER COATED BEADS 180 MG PO CP24: 180.0000 mg | ORAL_CAPSULE | Freq: Every day | ORAL | 3 refills | Status: DC

## 2016-11-19 ENCOUNTER — Encounter: Payer: Self-pay | Admitting: Gynecology

## 2016-11-19 MED ORDER — FLUCONAZOLE 150 MG PO TABS: 150.0000 mg | ORAL_TABLET | Freq: Every day | ORAL | 0 refills | Status: DC

## 2016-11-19 MED ORDER — BETAMETHASONE DIPROPIONATE 0.05 % EX CREA: TOPICAL_CREAM | Freq: Every day | CUTANEOUS | 0 refills | Status: DC

## 2016-11-19 NOTE — Telephone Encounter (Signed)
Okay for Diflucan 150 mg #51 by mouth daily 5 days and Diprolene 0.05% cream 30 g tube apply to affected area daily as needed

## 2016-11-20 ENCOUNTER — Telehealth: Payer: Self-pay | Admitting: Physician Assistant

## 2016-11-20 ENCOUNTER — Other Ambulatory Visit: Payer: Self-pay | Admitting: Physician Assistant

## 2016-11-20 DIAGNOSIS — R109 Unspecified abdominal pain: Secondary | ICD-10-CM

## 2016-11-20 MED ORDER — ALBUTEROL SULFATE HFA 108 (90 BASE) MCG/ACT IN AERS
2.0000 | INHALATION_SPRAY | Freq: Four times a day (QID) | RESPIRATORY_TRACT | 5 refills | Status: DC | PRN
Start: 2016-11-20 — End: 2017-05-26

## 2016-11-20 NOTE — Telephone Encounter (Signed)
Received notice from [facility] that they are no longer paying for her Ventolin. This just means that we need to switch to a different brand of albuterol -- likely ProAir. I will send in a script that she can fill the next time she needs her rescue inhaler.

## 2016-11-20 NOTE — Telephone Encounter (Signed)
Okay to leave message per [facility] -   Left detailed message of Cody's notes below.    Let patient know that if she had any questions or concerns she could call our office to discuss.

## 2016-11-21 ENCOUNTER — Other Ambulatory Visit: Payer: Self-pay | Admitting: Physician Assistant

## 2016-11-21 ENCOUNTER — Encounter: Payer: Self-pay | Admitting: Physician Assistant

## 2016-11-21 DIAGNOSIS — R109 Unspecified abdominal pain: Secondary | ICD-10-CM

## 2016-12-02 ENCOUNTER — Ambulatory Visit: Payer: Self-pay | Admitting: Physician Assistant

## 2016-12-04 ENCOUNTER — Ambulatory Visit (INDEPENDENT_AMBULATORY_CARE_PROVIDER_SITE_OTHER): Payer: Medicare HMO | Admitting: Physician Assistant

## 2016-12-04 ENCOUNTER — Encounter: Payer: Self-pay | Admitting: Physician Assistant

## 2016-12-04 ENCOUNTER — Other Ambulatory Visit: Payer: Self-pay | Admitting: Physician Assistant

## 2016-12-04 VITALS — BP 130/64 | HR 78 | Temp 98.7°F | Resp 14 | Ht 66.0 in | Wt 163.0 lb

## 2016-12-04 DIAGNOSIS — R1013 Epigastric pain: Secondary | ICD-10-CM | POA: Diagnosis not present

## 2016-12-04 DIAGNOSIS — G8929 Other chronic pain: Secondary | ICD-10-CM

## 2016-12-04 LAB — COMPREHENSIVE METABOLIC PANEL
ALK PHOS: 116 U/L (ref 39–117)
ALT: 16 U/L (ref 0–35)
AST: 16 U/L (ref 0–37)
Albumin: 4.7 g/dL (ref 3.5–5.2)
BILIRUBIN TOTAL: 0.5 mg/dL (ref 0.2–1.2)
BUN: 7 mg/dL (ref 6–23)
CO2: 26 mEq/L (ref 19–32)
Calcium: 9.6 mg/dL (ref 8.4–10.5)
Chloride: 103 mEq/L (ref 96–112)
Creatinine, Ser: 0.7 mg/dL (ref 0.40–1.20)
GFR: 88.88 mL/min (ref >60.00)
GLUCOSE: 96 mg/dL (ref 70–99)
POTASSIUM: 4.8 meq/L (ref 3.5–5.1)
Sodium: 138 mEq/L (ref 135–145)
TOTAL PROTEIN: 7.7 g/dL (ref 6.0–8.3)

## 2016-12-04 LAB — CBC WITH DIFFERENTIAL/PLATELET
Basophils Absolute: 0.1 10*3/uL (ref 0.0–0.1)
Basophils Relative: 1 % (ref 0.0–3.0)
Eosinophils Absolute: 0.1 10*3/uL (ref 0.0–0.7)
Eosinophils Relative: 2 % (ref 0.0–5.0)
HEMATOCRIT: 44.2 % (ref 36.0–46.0)
Hemoglobin: 14.7 g/dL (ref 12.0–15.0)
LYMPHS ABS: 2.3 10*3/uL (ref 0.7–4.0)
LYMPHS PCT: 35.8 % (ref 12.0–46.0)
MCHC: 33.3 g/dL (ref 30.0–36.0)
MCV: 93.1 fl (ref 78.0–100.0)
MONOS PCT: 9.1 % (ref 3.0–12.0)
Monocytes Absolute: 0.6 10*3/uL (ref 0.1–1.0)
NEUTROS ABS: 3.4 10*3/uL (ref 1.4–7.7)
NEUTROS PCT: 52.1 % (ref 43.0–77.0)
Platelets: 339 10*3/uL (ref 150.0–400.0)
RBC: 4.75 Mil/uL (ref 3.87–5.11)
RDW: 13.1 % (ref 11.5–15.5)
WBC: 6.5 10*3/uL (ref 4.0–10.5)

## 2016-12-04 LAB — LIPASE: LIPASE: 10 U/L — AB (ref 11.0–59.0)

## 2016-12-04 LAB — H. PYLORI ANTIBODY, IGG: H PYLORI IGG: NEGATIVE

## 2016-12-04 MED ORDER — SUCRALFATE 1 G PO TABS: 1.0000 g | ORAL_TABLET | Freq: Three times a day (TID) | ORAL | 1 refills | Status: DC

## 2016-12-04 MED ORDER — DEXLANSOPRAZOLE 60 MG PO CPDR: 60.0000 mg | DELAYED_RELEASE_CAPSULE | Freq: Every day | ORAL | 1 refills | Status: DC

## 2016-12-04 NOTE — Progress Notes (Signed)
Pre visit review using our clinic review tool, if applicable. No additional management support is needed unless otherwise documented below in the visit note. 

## 2016-12-04 NOTE — Patient Instructions (Addendum)
Please go to the lab for blood work. I will call you with your results.  We are trying to get the [place] approved that you will take instead of Protonix. Until we do, take the Protonix as directed along with the Zantac. We are adding on the Carafate for you to take as directed.  I am setting you up with [facility] ASAP since you are not going back to your GI in [place].  Please return stool studies in mail ASAP.   If anything worsens, please go to the ER.

## 2016-12-04 NOTE — Progress Notes (Signed)
Patient presents to clinic today c/o continued chronic RUQ pain. Patient has suffered from a chronic RUQ pain described as aching and sometimes stabbing for several years. Patient endorses aching pain in RUQ. Has noted nausea, constipation. Has noted some melena but has been taking pepto bismol. Notes fatigue.. Denies tenesmus, hematochezia, lightheadedness or dizziness. Has been having significant heart burn over the past month despite use of Protonix as directed. Notes some epigastric pain and LUQ pain over the past few weeks. For the chronic RUQ pain, patient has been assessed multiple times by her previous PCP. Patient is followed by GI, with last office visit 02/2016. At that time EGD and colonoscopy recommended for further assessment. Patient canceled scheduled procedures and has not followed up with them as scheduled. Workup to date for this has included multiple CT abdomen/pelvis , anorectal manometry, gastric emptying study.  Past Medical History:  Diagnosis Date  . 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 Allergic state 12/31/2012  . Anxiety   . Asthma   . Barrett esophagus   . Blind loop syndrome   . C. difficile diarrhea   . Cancer ([place])   . Chest pain, atypical 07/14/2011  . Cold sore 07/17/2016  . Colon polyp   . Contact dermatitis 03/23/2012  . Cystocele, midline   . Depression   . Diarrhea   . Diverticulosis   . Endometrial hyperplasia 02/27/2006   BENIGN ENDO BX ON 02/2007  . Family history of malignant neoplasm of gastrointestinal tract    multiple members  . GERD (gastroesophageal reflux disease)   . Headache 04/16/2015  . Headache(784.0) 04/15/2012  . Hematuria 04/27/2012  . Hepatic cyst   . Hiatal hernia   . Hiatal hernia   . Hyperlipidemia, mixed 12/17/2014  . Hypertension   . IBS (irritable bowel syndrome)   . Insomnia   . Leaky heart valve   . Low back pain 08/15/2013  . Osteoporosis 05/2011   T score-2.5 Lt femoral neck 2012, T score  -2.4 2016 FRAX 12%/2.4%  . 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 Paronychia of great toe, left 06/19/2013  . Preventative health care 09/11/2014  . Rectal bleeding 10/10/2011  . Rectocele   . 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 Sinusitis acute 02/03/2012  . Thrush 07/21/2011  . 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 Unspecified hypothyroidism   . Unspecified menopausal and postmenopausal disorder   . Uterine prolapse without mention of vaginal wall prolapse   . Vitamin B12 deficiency   . Vitamin D deficiency 07/17/2016    Current Outpatient Prescriptions on File Prior to Visit  Medication Sig Dispense Refill  . albuterol (PROVENTIL HFA;VENTOLIN HFA) 108 (90 Base) MCG/ACT inhaler Inhale 2 puffs into the lungs every 6 (six) hours as needed for wheezing or shortness of breath. 1 Inhaler 5  . aspirin EC 81 MG tablet Take 1 tablet (81 mg total) by mouth daily.    Marland Kitchen augmented betamethasone dipropionate (DIPROLENE AF) 0.05 % cream Apply topically 2 (two) times daily. 30 g 0  . azelastine (ASTELIN) 0.1 % nasal spray Place 1 spray into both nostrils 2 (two) times daily as needed for rhinitis. Use in each nostril as directed    . betamethasone dipropionate (DIPROLENE) 0.05 % cream Apply topically daily. Apply to affected area daily as needed 30 g 0  . carvedilol (COREG) 12.5 MG tablet Take 1 tablet (12.5 mg total) by mouth 2 (two) times daily with a meal. 180 tablet 3  . diltiazem (  CARDIZEM CD) 180 MG 24 hr capsule Take 1 capsule (180 mg total) by mouth daily. 90 capsule 3  . fluconazole (DIFLUCAN) 150 MG tablet Take 1 tablet (150 mg total) by mouth daily. 5 tablet 0  . fluticasone (FLONASE) 50 MCG/ACT nasal spray Place 2 sprays into both nostrils daily.    . hyoscyamine (LEVBID) 0.375 MG 12 hr tablet Take 0.375 mg by mouth 3 (three) times daily as needed (colon spasms).    . ondansetron (ZOFRAN) 4 MG tablet Take 4 mg by mouth 2 (two) times daily as needed for nausea/vomiting.    . Probiotic CAPS Take 1 capsule  by mouth daily.    . promethazine (PHENERGAN) 25 MG tablet Take 1 tablet (25 mg total) by mouth 3 (three) times daily as needed for nausea or vomiting. 30 tablet 0  . ranitidine (ZANTAC) 300 MG tablet Take 1 tablet (300 mg total) by mouth at bedtime. 90 tablet 1  . traZODone (DESYREL) 50 MG tablet TAKE 1/2 TO 1 TABLET AT BEDTIME AS NEEDED FOR  SLEEP (Patient not taking: Reported on 12/04/2016) 90 tablet 1   No current facility-administered medications on file prior to visit.     Allergies  Allergen Reactions  . Iodine-131 Other (See Comments)    Other reaction(s): Dizziness (intolerance)  . Sulfamethoxazole Shortness Of Breath    chest tightness  . Amitriptyline Anxiety and Other (See Comments)    Depression, insomnia  . Effexor [Venlafaxine]     palpitations  . Iohexol Other (See Comments)    Passed out  . Lactose Intolerance (Gi) Diarrhea and Nausea And Vomiting  . Lexapro [Escitalopram Oxalate]     nausea  . Iodinated Diagnostic Agents     Pt states at Dr.Grapeys office 15 years ago blacked out for 2 hours during injection for IVP. Pt says she had dye recently with no problems     Family History  Problem Relation Age of Onset  . Colon cancer Mother   . Diabetes Mother   . Hypertension Mother   . Colon polyps Father   . Parkinson's disease Father   . Colon cancer Maternal Grandmother   . Breast cancer Maternal Grandmother 54  . Diabetes Paternal Grandmother   . Breast cancer Paternal Grandmother 52  . Allergies Sister   . Arthritis Brother     b/l hip replace  . Alcohol abuse Daughter   . Arthritis Son   . Hypertension Son   . Allergies Son   . Osteoporosis Son   . Osteoporosis Son   . Arthritis Son     back disease, DDD  . Allergies Son   . Arthritis Son   . Irritable bowel syndrome Son     RSD  . Hypertension Paternal Grandfather   . Heart disease Paternal Grandfather   . Aneurysm Paternal Grandfather   . Colon cancer Maternal Aunt   . Colon cancer Cousin       X3  . Ovarian cancer Cousin     Social History   Social History  . Marital status: Married    Spouse name: N/A  . Number of children: 4  . Years of education: N/A   Occupational History  . Retired    Social History Main Topics  . Smoking status: Former Smoker    Quit date: 08/19/1995  . Smokeless tobacco: Never Used  . Alcohol use No  . Drug use: No  . Sexual activity: Not Currently    Birth control/ protection: Post-menopausal  Comment: 1st intercourse 31 yo-5 partners   Other Topics Concern  . None   Social History Narrative  . None   Review of Systems - See HPI.  All other ROS are negative.  BP 130/64   Pulse 78   Temp 98.7 F (37.1 C) (Oral)   Resp 14   Ht _0  (1.676 m)   Wt 163 lb (73.9 kg)   SpO2 98%   BMI 26.31 kg/m   Physical Exam  Constitutional: She is oriented to person, place, and time and well-developed, well-nourished, and in no distress.  HENT:  Head: Normocephalic and atraumatic.  Eyes: Conjunctivae are normal.  Neck: Neck supple.  Cardiovascular: Normal rate, regular rhythm, normal heart sounds and intact distal pulses.   Pulmonary/Chest: Effort normal and breath sounds normal. No respiratory distress. She has no wheezes. She has no rales. She exhibits no tenderness.  Abdominal: Soft. Bowel sounds are normal. She exhibits no distension and no mass. There is tenderness in the right upper quadrant, epigastric area and left upper quadrant. There is no rebound and no guarding.  Genitourinary: Rectal exam shows guaiac negative stool.  Neurological: She is alert and oriented to person, place, and time.  Skin: Skin is warm. No rash noted.  Psychiatric: Affect normal.  Vitals reviewed.  Assessment/Plan: 1. Chronic epigastric pain Long-standing history of RUQ and epigastric pain in patient with history of significant GERd. Non-adherent to further workup recommended by GI. Now with signs of acute gastritis. Hemoccult negative. Labs today to  include h. Pylori and lipase. Will stop Protonix and begin Dexilant and carafate. Urgent follow-up scheduled with her GI for EGD and further assessment. Alarms signs/symptoms reviewed with patient.  - CBC w/Diff - Comp Met (CMET) - Lipase - H. pylori antibody, IgG - sucralfate (CARAFATE) 1 g tablet; Take 1 tablet (1 g total) by mouth 4 (four) times daily -  with meals and at bedtime.  Dispense: 90 tablet; Refill: 1   Leeanne Rio, [place]

## 2016-12-05 ENCOUNTER — Other Ambulatory Visit: Payer: Self-pay

## 2016-12-08 ENCOUNTER — Other Ambulatory Visit: Payer: Self-pay | Admitting: Physician Assistant

## 2016-12-08 ENCOUNTER — Encounter: Payer: Self-pay | Admitting: Physician Assistant

## 2016-12-08 ENCOUNTER — Telehealth: Payer: Self-pay | Admitting: *Deleted

## 2016-12-08 DIAGNOSIS — G8929 Other chronic pain: Secondary | ICD-10-CM

## 2016-12-08 DIAGNOSIS — R1013 Epigastric pain: Principal | ICD-10-CM

## 2016-12-08 NOTE — Telephone Encounter (Signed)
Attempted Prior Authorization with Cover [employer] Med[employer] for Dexilant - states that no prior authorization is needed for that medication.

## 2016-12-09 MED ORDER — RABEPRAZOLE SODIUM 20 MG PO TBEC: 20.0000 mg | DELAYED_RELEASE_TABLET | Freq: Every day | ORAL | 1 refills | Status: DC

## 2016-12-09 NOTE — Telephone Encounter (Signed)
Patient has been made aware of the new RX

## 2016-12-09 NOTE — Addendum Note (Signed)
Addended by: Brunetta Jeans on: 12/09/2016 11:21 AM   Modules accepted: Orders

## 2016-12-09 NOTE — Telephone Encounter (Signed)
Will try Aciphex since Dexilant was too expensive. Rx sent in.

## 2016-12-11 ENCOUNTER — Other Ambulatory Visit (INDEPENDENT_AMBULATORY_CARE_PROVIDER_SITE_OTHER): Payer: Medicare HMO

## 2016-12-11 ENCOUNTER — Other Ambulatory Visit: Payer: Self-pay | Admitting: Emergency Medicine

## 2016-12-11 DIAGNOSIS — R109 Unspecified abdominal pain: Secondary | ICD-10-CM

## 2016-12-11 LAB — FECAL OCCULT BLOOD, IMMUNOCHEMICAL: FECAL OCCULT BLD: NEGATIVE

## 2016-12-15 ENCOUNTER — Other Ambulatory Visit: Payer: Self-pay | Admitting: Family Medicine

## 2016-12-17 DIAGNOSIS — K219 Gastro-esophageal reflux disease without esophagitis: Secondary | ICD-10-CM | POA: Diagnosis not present

## 2016-12-17 DIAGNOSIS — R12 Heartburn: Secondary | ICD-10-CM | POA: Diagnosis not present

## 2016-12-17 DIAGNOSIS — D3A010 Benign carcinoid tumor of the duodenum: Secondary | ICD-10-CM | POA: Diagnosis not present

## 2016-12-17 DIAGNOSIS — R1013 Epigastric pain: Secondary | ICD-10-CM | POA: Diagnosis not present

## 2016-12-17 DIAGNOSIS — J449 Chronic obstructive pulmonary disease, unspecified: Secondary | ICD-10-CM | POA: Diagnosis not present

## 2016-12-17 DIAGNOSIS — K317 Polyp of stomach and duodenum: Secondary | ICD-10-CM | POA: Diagnosis not present

## 2016-12-18 ENCOUNTER — Other Ambulatory Visit: Payer: Self-pay | Admitting: Physician Assistant

## 2016-12-22 ENCOUNTER — Encounter: Payer: Self-pay | Admitting: Physician Assistant

## 2016-12-23 ENCOUNTER — Ambulatory Visit: Payer: Medicare HMO | Admitting: Physician Assistant

## 2016-12-29 DIAGNOSIS — R0789 Other chest pain: Secondary | ICD-10-CM | POA: Diagnosis not present

## 2016-12-29 DIAGNOSIS — R05 Cough: Secondary | ICD-10-CM | POA: Diagnosis not present

## 2016-12-29 DIAGNOSIS — R079 Chest pain, unspecified: Secondary | ICD-10-CM | POA: Diagnosis not present

## 2017-01-27 ENCOUNTER — Encounter: Payer: Self-pay | Admitting: Physician Assistant

## 2017-01-27 ENCOUNTER — Ambulatory Visit (INDEPENDENT_AMBULATORY_CARE_PROVIDER_SITE_OTHER): Payer: Medicare HMO | Admitting: Physician Assistant

## 2017-01-27 ENCOUNTER — Other Ambulatory Visit: Payer: Self-pay | Admitting: Family Medicine

## 2017-01-27 VITALS — BP 130/72 | HR 73 | Temp 97.9°F | Resp 14 | Ht 66.0 in | Wt 160.0 lb

## 2017-01-27 DIAGNOSIS — M5442 Lumbago with sciatica, left side: Secondary | ICD-10-CM | POA: Diagnosis not present

## 2017-01-27 DIAGNOSIS — R109 Unspecified abdominal pain: Secondary | ICD-10-CM

## 2017-01-27 MED ORDER — METHOCARBAMOL 500 MG PO TABS: 500.0000 mg | ORAL_TABLET | Freq: Three times a day (TID) | ORAL | 0 refills | Status: DC | PRN

## 2017-01-27 MED ORDER — METHYLPREDNISOLONE 4 MG PO TBPK: ORAL_TABLET | ORAL | 0 refills | Status: DC

## 2017-01-27 NOTE — Progress Notes (Signed)
Pre visit review using our clinic review tool, if applicable. No additional management support is needed unless otherwise documented below in the visit note. 

## 2017-01-27 NOTE — Patient Instructions (Signed)
No heavy lifting! Avoid overexertion. Take the steroid as directed for pain and inflammation. Can take Tylenol for breakthrough pain. Take the Robaxin in the evening to help with muscle spasms. This can be taken up to three times daily but can make you drowsy. No driving while on this medication.  Once pain is resolving, start the exercises below. Follow-up if symptoms are not resolving.    Sciatica Rehab Ask your health care provider which exercises are safe for you. Do exercises exactly as told by your health care provider and adjust them as directed. It is normal to feel mild stretching, pulling, tightness, or discomfort as you do these exercises, but you should stop right away if you feel sudden pain or your pain gets worse.Do not begin these exercises until told by your health care provider. Stretching and range of motion exercises These exercises warm up your muscles and joints and improve the movement and flexibility of your hips and your back. These exercises also help to relieve pain, numbness, and tingling. Exercise A: Sciatic nerve glide 1. Sit in a chair with your head facing down toward your chest. Place your hands behind your back. Let your shoulders slump forward. 2. Slowly straighten one of your knees while you tilt your head back as if you are looking toward the ceiling. Only straighten your leg as far as you can without making your symptoms worse. 3. Hold for __________ seconds. 4. Slowly return to the starting position. 5. Repeat with your other leg. Repeat __________ times. Complete this exercise __________ times a day. Exercise B: Knee to chest with hip adduction and internal rotation  1. Lie on your back on a firm surface with both legs straight. 2. Bend one of your knees and move it up toward your chest until you feel a gentle stretch in your lower back and buttock. Then, move your knee toward the shoulder that is on the opposite side from your leg. ? Hold your leg in  this position by holding onto the front of your knee. 3. Hold for __________ seconds. 4. Slowly return to the starting position. 5. Repeat with your other leg. Repeat __________ times. Complete this exercise __________ times a day. Exercise C: Prone extension on elbows  1. Lie on your abdomen on a firm surface. A bed may be too soft for this exercise. 2. Prop yourself up on your elbows. 3. Use your arms to help lift your chest up until you feel a gentle stretch in your abdomen and your lower back. ? This will place some of your body weight on your elbows. If this is uncomfortable, try stacking pillows under your chest. ? Your hips should stay down, against the surface that you are lying on. Keep your hip and back muscles relaxed. 4. Hold for __________ seconds. 5. Slowly relax your upper body and return to the starting position. Repeat __________ times. Complete this exercise __________ times a day. Strengthening exercises These exercises build strength and endurance in your back. Endurance is the ability to use your muscles for a long time, even after they get tired. Exercise D: Pelvic tilt 1. Lie on your back on a firm surface. Bend your knees and keep your feet flat. 2. Tense your abdominal muscles. Tip your pelvis up toward the ceiling and flatten your lower back into the floor. ? To help with this exercise, you may place a small towel under your lower back and try to push your back into the towel. 3. Hold for  __________ seconds. 4. Let your muscles relax completely before you repeat this exercise. Repeat __________ times. Complete this exercise __________ times a day. Exercise E: Alternating arm and leg raises  1. Get on your hands and knees on a firm surface. If you are on a hard floor, you may want to use padding to cushion your knees, such as an exercise mat. 2. Line up your arms and legs. Your hands should be below your shoulders, and your knees should be below your hips. 3. Lift  your left leg behind you. At the same time, raise your right arm and straighten it in front of you. ? Do not lift your leg higher than your hip. ? Do not lift your arm higher than your shoulder. ? Keep your abdominal and back muscles tight. ? Keep your hips facing the ground. ? Do not arch your back. ? Keep your balance carefully, and do not hold your breath. 4. Hold for __________ seconds. 5. Slowly return to the starting position and repeat with your right leg and your left arm. Repeat __________ times. Complete this exercise __________ times a day. Posture and body mechanics  Body mechanics refers to the movements and positions of your body while you do your daily activities. Posture is part of body mechanics. Good posture and healthy body mechanics can help to relieve stress in your body's tissues and joints. Good posture means that your spine is in its natural S-curve position (your spine is neutral), your shoulders are pulled back slightly, and your head is not tipped forward. The following are general guidelines for applying improved posture and body mechanics to your everyday activities. Standing   When standing, keep your spine neutral and your feet about hip-width apart. Keep a slight bend in your knees. Your ears, shoulders, and hips should line up.  When you do a task in which you stand in one place for a long time, place one foot up on a stable object that is 2-4 inches (5-10 cm) high, such as a footstool. This helps keep your spine neutral. Sitting   When sitting, keep your spine neutral and keep your feet flat on the floor. Use a footrest, if necessary, and keep your thighs parallel to the floor. Avoid rounding your shoulders, and avoid tilting your head forward.  When working at a desk or a computer, keep your desk at a height where your hands are slightly lower than your elbows. Slide your chair under your desk so you are close enough to maintain good posture.  When working  at a computer, place your monitor at a height where you are looking straight ahead and you do not have to tilt your head forward or downward to look at the screen. Resting   When lying down and resting, avoid positions that are most painful for you.  If you have pain with activities such as sitting, bending, stooping, or squatting (flexion-based activities), lie in a position in which your body does not bend very much. For example, avoid curling up on your side with your arms and knees near your chest (fetal position).  If you have pain with activities such as standing for a long time or reaching with your arms (extension-based activities), lie with your spine in a neutral position and bend your knees slightly. Try the following positions: ? Lying on your side with a pillow between your knees. ? Lying on your back with a pillow under your knees. Lifting   When lifting objects, keep  your feet at least shoulder-width apart and tighten your abdominal muscles.  Bend your knees and hips and keep your spine neutral. It is important to lift using the strength of your legs, not your back. Do not lock your knees straight out.  Always ask for help to lift heavy or awkward objects. This information is not intended to replace advice given to you by your health care provider. Make sure you discuss any questions you have with your health care provider. Document Released: 08/04/2005 Document Revised: 04/10/2016 Document Reviewed: 04/20/2015 Elsevier Interactive Patient Education  [employer].

## 2017-01-27 NOTE — Progress Notes (Signed)
Patient presents to clinic today c/o back pain starting Saturday. Pain is described as aching/throbbing. Is located in the lower back and is bilateral. Notes occasional radiation of pain into the LLE without numbness, tingling or weakness.  Pain began the day after episode of physical activity -- patient endorses painting the week prior to symptom onset and heavy lifting moving furniture) a couple of days prior to pain flare-up. Patient endorses using [place] and tylenol with little relief of symptoms.   Past Medical History:  Diagnosis Date  . 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 Allergic state 12/31/2012  . Anxiety   . Asthma   . Barrett esophagus   . Blind loop syndrome   . C. difficile diarrhea   . Cancer ([place])   . Chest pain, atypical 07/14/2011  . Cold sore 07/17/2016  . Colon polyp   . Contact dermatitis 03/23/2012  . Cystocele, midline   . Depression   . Diarrhea   . Diverticulosis   . Endometrial hyperplasia 02/27/2006   BENIGN ENDO BX ON 02/2007  . Family history of malignant neoplasm of gastrointestinal tract    multiple members  . GERD (gastroesophageal reflux disease)   . Headache 04/16/2015  . Headache(784.0) 04/15/2012  . Hematuria 04/27/2012  . Hepatic cyst   . Hiatal hernia   . Hiatal hernia   . Hyperlipidemia, mixed 12/17/2014  . Hypertension   . IBS (irritable bowel syndrome)   . Insomnia   . Leaky heart valve   . Low back pain 08/15/2013  . Osteoporosis 05/2011   T score-2.5 Lt femoral neck 2012, T score -2.4 2016 FRAX 12%/2.4%  . 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 Paronychia of great toe, left 06/19/2013  . Preventative health care 09/11/2014  . Rectal bleeding 10/10/2011  . Rectocele   . 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 Sinusitis acute 02/03/2012  . Thrush 07/21/2011  . 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 Unspecified hypothyroidism   . Unspecified menopausal and postmenopausal disorder   . Uterine prolapse without mention  of vaginal wall prolapse   . Vitamin B12 deficiency   . Vitamin D deficiency 07/17/2016    Current Outpatient Prescriptions on File Prior to Visit  Medication Sig Dispense Refill  . albuterol (PROVENTIL HFA;VENTOLIN HFA) 108 (90 Base) MCG/ACT inhaler Inhale 2 puffs into the lungs every 6 (six) hours as needed for wheezing or shortness of breath. 1 Inhaler 5  . aspirin EC 81 MG tablet Take 1 tablet (81 mg total) by mouth daily.    Marland Kitchen azelastine (ASTELIN) 0.1 % nasal spray Place 1 spray into both nostrils 2 (two) times daily as needed for rhinitis. Use in each nostril as directed    . carvedilol (COREG) 12.5 MG tablet Take 1 tablet (12.5 mg total) by mouth 2 (two) times daily with a meal. 180 tablet 3  . diltiazem (CARDIZEM CD) 180 MG 24 hr capsule Take 1 capsule (180 mg total) by mouth daily. 90 capsule 3  . fluticasone (FLONASE) 50 MCG/ACT nasal spray Place 2 sprays into both nostrils daily.    . hyoscyamine (LEVBID) 0.375 MG 12 hr tablet Take 0.375 mg by mouth 3 (three) times daily as needed (colon spasms).    . ondansetron (ZOFRAN) 4 MG tablet Take 4 mg by mouth 2 (two) times daily as needed for nausea/vomiting.    . Probiotic CAPS Take 1 capsule by mouth daily.    . promethazine (PHENERGAN) 25 MG tablet Take 1  tablet (25 mg total) by mouth 3 (three) times daily as needed for nausea or vomiting. 30 tablet 0  . ranitidine (ZANTAC) 300 MG tablet TAKE 1 TABLET (300 MG TOTAL) BY MOUTH AT BEDTIME. 90 tablet 1  . traZODone (DESYREL) 50 MG tablet TAKE 1/2 TO 1 TABLET AT BEDTIME AS NEEDED FOR  SLEEP 90 tablet 1   No current facility-administered medications on file prior to visit.     Allergies  Allergen Reactions  . Iodine-131 Other (See Comments)    Other reaction(s): Dizziness (intolerance)  . Sulfamethoxazole Shortness Of Breath    chest tightness  . Amitriptyline Anxiety and Other (See Comments)    Depression, insomnia  . Effexor [Venlafaxine]     palpitations  . Iohexol Other (See  Comments)    Passed out  . Lactose Intolerance (Gi) Diarrhea and Nausea And Vomiting  . Lexapro [Escitalopram Oxalate]     nausea  . Iodinated Diagnostic Agents     Pt states at Dr.Grapeys office 15 years ago blacked out for 2 hours during injection for IVP. Pt says she had dye recently with no problems     Family History  Problem Relation Age of Onset  . Colon cancer Mother   . Diabetes Mother   . Hypertension Mother   . Colon polyps Father   . Parkinson's disease Father   . Colon cancer Maternal Grandmother   . Breast cancer Maternal Grandmother 24  . Diabetes Paternal Grandmother   . Breast cancer Paternal Grandmother 44  . Allergies Sister   . Arthritis Brother        b/l hip replace  . Alcohol abuse Daughter   . Arthritis Son   . Hypertension Son   . Allergies Son   . Osteoporosis Son   . Osteoporosis Son   . Arthritis Son        back disease, DDD  . Allergies Son   . Arthritis Son   . Irritable bowel syndrome Son        RSD  . Hypertension Paternal Grandfather   . Heart disease Paternal Grandfather   . Aneurysm Paternal Grandfather   . Colon cancer Maternal Aunt   . Colon cancer Cousin        X3  . Ovarian cancer Cousin     Social History   Social History  . Marital status: Married    Spouse name: N/A  . Number of children: 4  . Years of education: N/A   Occupational History  . Retired    Social History Main Topics  . Smoking status: Former Smoker    Quit date: 08/19/1995  . Smokeless tobacco: Never Used  . Alcohol use No  . Drug use: No  . Sexual activity: Not Currently    Birth control/ protection: Post-menopausal     Comment: 1st intercourse 89 yo-5 partners   Other Topics Concern  . None   Social History Narrative  . None   Review of Systems - See HPI.  All other ROS are negative.  BP 130/72   Pulse 73   Temp 97.9 F (36.6 C) (Oral)   Resp 14   Ht '5\' 6"'$  (1.676 m)   Wt 160 lb (72.6 kg)   SpO2 98%   BMI 25.82 kg/m   Physical  Exam  Constitutional: She is oriented to person, place, and time and well-developed, well-nourished, and in no distress.  HENT:  Head: Normocephalic and atraumatic.  Cardiovascular: Normal rate, regular rhythm, normal heart sounds  and intact distal pulses.   Pulmonary/Chest: Effort normal and breath sounds normal. No respiratory distress. She has no wheezes. She has no rales.  Musculoskeletal:       Cervical back: Normal.       Thoracic back: Normal.       Lumbar back: She exhibits tenderness, pain and spasm. She exhibits normal range of motion, no bony tenderness and no swelling.  Neurological: She is alert and oriented to person, place, and time.  Skin: Skin is warm and dry. No rash noted.  Psychiatric: Affect normal.  Vitals reviewed.   Recent Results (from the past 2160 hour(s))  CBC w/Diff     Status: None   Collection Time: 12/04/16 10:42 AM  Result Value Ref Range   WBC 6.5 4.0 - 10.5 K/uL   RBC 4.75 3.87 - 5.11 Mil/uL   Hemoglobin 14.7 12.0 - 15.0 g/dL   HCT 44.2 36.0 - 46.0 %   MCV 93.1 78.0 - 100.0 fl   MCHC 33.3 30.0 - 36.0 g/dL   RDW 13.1 11.5 - 15.5 %   Platelets 339.0 150.0 - 400.0 K/uL   Neutrophils Relative % 52.1 43.0 - 77.0 %   Lymphocytes Relative 35.8 12.0 - 46.0 %   Monocytes Relative 9.1 3.0 - 12.0 %   Eosinophils Relative 2.0 0.0 - 5.0 %   Basophils Relative 1.0 0.0 - 3.0 %   Neutro Abs 3.4 1.4 - 7.7 K/uL   Lymphs Abs 2.3 0.7 - 4.0 K/uL   Monocytes Absolute 0.6 0.1 - 1.0 K/uL   Eosinophils Absolute 0.1 0.0 - 0.7 K/uL   Basophils Absolute 0.1 0.0 - 0.1 K/uL  Comp Met (CMET)     Status: None   Collection Time: 12/04/16 10:42 AM  Result Value Ref Range   Sodium 138 135 - 145 mEq/L   Potassium 4.8 3.5 - 5.1 mEq/L   Chloride 103 96 - 112 mEq/L   CO2 26 19 - 32 mEq/L   Glucose, Bld 96 70 - 99 mg/dL   BUN 7 6 - 23 mg/dL   Creatinine, Ser 0.70 0.40 - 1.20 mg/dL   Total Bilirubin 0.5 0.2 - 1.2 mg/dL   Alkaline Phosphatase 116 39 - 117 U/L   AST 16 0 -  37 U/L   ALT 16 0 - 35 U/L   Total Protein 7.7 6.0 - 8.3 g/dL   Albumin 4.7 3.5 - 5.2 g/dL   Calcium 9.6 8.4 - 10.5 mg/dL   GFR 88.88 >60.00 mL/min  Lipase     Status: Abnormal   Collection Time: 12/04/16 10:42 AM  Result Value Ref Range   Lipase 10.0 (L) 11.0 - 59.0 U/L  H. pylori antibody, IgG     Status: None   Collection Time: 12/04/16 10:42 AM  Result Value Ref Range   H Pylori IgG Negative Negative  Fecal occult blood, imunochemical     Status: None   Collection Time: 12/11/16  4:26 PM  Result Value Ref Range   Fecal Occult Bld Negative Negative    Assessment/Plan: 1. Acute bilateral low back pain with left-sided sciatica Atraumatic. No bony tenderness. Pain of perispinal musculature in lumbosacral region. No alarm signs/symptoms. Start Medrol pack and Robaxin. Supportive measures and OTC medications reviewed. Follow-up if not resolving.   - methylPREDNISolone (MEDROL DOSEPAK) 4 MG TBPK tablet; Take following package directions.  Dispense: 21 tablet; Refill: 0 - methocarbamol (ROBAXIN) 500 MG tablet; Take 1 tablet (500 mg total) by mouth every 8 (eight) hours  as needed for muscle spasms.  Dispense: 15 tablet; Refill: 0   Leeanne Rio, [place]

## 2017-02-12 DIAGNOSIS — K2270 Barrett's esophagus without dysplasia: Secondary | ICD-10-CM | POA: Diagnosis not present

## 2017-02-12 DIAGNOSIS — J45909 Unspecified asthma, uncomplicated: Secondary | ICD-10-CM | POA: Diagnosis not present

## 2017-02-12 DIAGNOSIS — D3A010 Benign carcinoid tumor of the duodenum: Secondary | ICD-10-CM | POA: Diagnosis not present

## 2017-02-12 DIAGNOSIS — M810 Age-related osteoporosis without current pathological fracture: Secondary | ICD-10-CM | POA: Diagnosis not present

## 2017-02-12 DIAGNOSIS — H9190 Unspecified hearing loss, unspecified ear: Secondary | ICD-10-CM | POA: Diagnosis not present

## 2017-02-12 DIAGNOSIS — K3189 Other diseases of stomach and duodenum: Secondary | ICD-10-CM | POA: Diagnosis not present

## 2017-02-12 DIAGNOSIS — F329 Major depressive disorder, single episode, unspecified: Secondary | ICD-10-CM | POA: Diagnosis not present

## 2017-02-12 DIAGNOSIS — K219 Gastro-esophageal reflux disease without esophagitis: Secondary | ICD-10-CM | POA: Diagnosis not present

## 2017-02-12 DIAGNOSIS — K317 Polyp of stomach and duodenum: Secondary | ICD-10-CM | POA: Diagnosis not present

## 2017-02-12 DIAGNOSIS — K589 Irritable bowel syndrome without diarrhea: Secondary | ICD-10-CM | POA: Diagnosis not present

## 2017-02-12 DIAGNOSIS — F419 Anxiety disorder, unspecified: Secondary | ICD-10-CM | POA: Diagnosis not present

## 2017-03-03 ENCOUNTER — Ambulatory Visit (INDEPENDENT_AMBULATORY_CARE_PROVIDER_SITE_OTHER): Payer: Medicare HMO | Admitting: Physician Assistant

## 2017-03-03 ENCOUNTER — Encounter: Payer: Self-pay | Admitting: Physician Assistant

## 2017-03-03 VITALS — BP 132/72 | HR 68 | Temp 98.0°F | Resp 14 | Ht 66.0 in | Wt 161.0 lb

## 2017-03-03 DIAGNOSIS — R5383 Other fatigue: Secondary | ICD-10-CM | POA: Diagnosis not present

## 2017-03-03 DIAGNOSIS — E538 Deficiency of other specified B group vitamins: Secondary | ICD-10-CM

## 2017-03-03 DIAGNOSIS — E559 Vitamin D deficiency, unspecified: Secondary | ICD-10-CM

## 2017-03-03 LAB — CBC WITH DIFFERENTIAL/PLATELET
BASOS PCT: 0 %
Basophils Absolute: 0 cells/uL (ref 0–200)
Eosinophils Absolute: 146 cells/uL (ref 15–500)
Eosinophils Relative: 2 %
HEMATOCRIT: 40.8 % (ref 35.0–45.0)
Hemoglobin: 13.6 g/dL (ref 11.7–15.5)
LYMPHS PCT: 37 %
Lymphs Abs: 2701 cells/uL (ref 850–3900)
MCH: 31.1 pg (ref 27.0–33.0)
MCHC: 33.3 g/dL (ref 32.0–36.0)
MCV: 93.2 fL (ref 80.0–100.0)
MONO ABS: 730 {cells}/uL (ref 200–950)
MONOS PCT: 10 %
MPV: 10.4 fL (ref 7.5–12.5)
Neutro Abs: 3723 cells/uL (ref 1500–7800)
Neutrophils Relative %: 51 %
PLATELETS: 356 10*3/uL (ref 140–400)
RBC: 4.38 MIL/uL (ref 3.80–5.10)
RDW: 13.2 % (ref 11.0–15.0)
WBC: 7.3 10*3/uL (ref 3.8–10.8)

## 2017-03-03 LAB — COMPREHENSIVE METABOLIC PANEL
ALT: 16 U/L (ref 6–29)
AST: 15 U/L (ref 10–35)
Albumin: 4.2 g/dL (ref 3.6–5.1)
Alkaline Phosphatase: 102 U/L (ref 33–130)
BUN: 6 mg/dL — ABNORMAL LOW (ref 7–25)
CALCIUM: 9.5 mg/dL (ref 8.6–10.4)
CO2: 22 mmol/L (ref 20–31)
Chloride: 103 mmol/L (ref 98–110)
Creat: 0.74 mg/dL (ref 0.50–0.99)
GLUCOSE: 94 mg/dL (ref 65–99)
POTASSIUM: 4.1 mmol/L (ref 3.5–5.3)
Sodium: 139 mmol/L (ref 135–146)
Total Bilirubin: 0.3 mg/dL (ref 0.2–1.2)
Total Protein: 6.9 g/dL (ref 6.1–8.1)

## 2017-03-03 MED ORDER — ONDANSETRON HCL 4 MG PO TABS: ORAL_TABLET | ORAL | 0 refills | Status: DC

## 2017-03-03 NOTE — Progress Notes (Signed)
Patient presents to clinic today c/o 3 days of fatigue. States she feels tired and sluggish. Has noted mild headache. States she has been checking BP at home and noted BP at 96/61. States she is hydrating well but has not been eating much over the past couple of days. Notes mild nausea with this. Denies change to bowel habits. Patient with history of chronic nausea and GI upset, followed by Gastroenterology. Is prescribed Zofran which has helped with nausea. Denies fever or chills. Denies diarrhea. States her husband has had similar issues over the past couple of days. State the only meal they have shared was a chicken salad the other day. Of note, patient endorses recent move into a mobile home. State the air conditioning has not been working well over the past few days -- noting the home is very hot and humid.   Past Medical History:  Diagnosis Date  . 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 Allergic state 12/31/2012  . Anxiety   . Asthma   . Barrett esophagus   . Blind loop syndrome   . C. difficile diarrhea   . Cancer ([place])   . Chest pain, atypical 07/14/2011  . Cold sore 07/17/2016  . Colon polyp   . Contact dermatitis 03/23/2012  . Cystocele, midline   . Depression   . Diarrhea   . Diverticulosis   . Endometrial hyperplasia 02/27/2006   BENIGN ENDO BX ON 02/2007  . Family history of malignant neoplasm of gastrointestinal tract    multiple members  . GERD (gastroesophageal reflux disease)   . Headache 04/16/2015  . Headache(784.0) 04/15/2012  . Hematuria 04/27/2012  . Hepatic cyst   . Hiatal hernia   . Hiatal hernia   . Hyperlipidemia, mixed 12/17/2014  . Hypertension   . IBS (irritable bowel syndrome)   . Insomnia   . Leaky heart valve   . Low back pain 08/15/2013  . Osteoporosis 05/2011   T score-2.5 Lt femoral neck 2012, T score -2.4 2016 FRAX 12%/2.4%  . 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 Paronychia of great toe, left 06/19/2013  . Preventative health  care 09/11/2014  . Rectal bleeding 10/10/2011  . Rectocele   . 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 Sinusitis acute 02/03/2012  . Thrush 07/21/2011  . 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 Unspecified hypothyroidism   . Unspecified menopausal and postmenopausal disorder   . Uterine prolapse without mention of vaginal wall prolapse   . Vitamin B12 deficiency   . Vitamin D deficiency 07/17/2016    Current Outpatient Prescriptions on File Prior to Visit  Medication Sig Dispense Refill  . albuterol (PROVENTIL HFA;VENTOLIN HFA) 108 (90 Base) MCG/ACT inhaler Inhale 2 puffs into the lungs every 6 (six) hours as needed for wheezing or shortness of breath. 1 Inhaler 5  . aspirin EC 81 MG tablet Take 1 tablet (81 mg total) by mouth daily.    Marland Kitchen azelastine (ASTELIN) 0.1 % nasal spray Place 1 spray into both nostrils 2 (two) times daily as needed for rhinitis. Use in each nostril as directed    . carvedilol (COREG) 12.5 MG tablet Take 1 tablet (12.5 mg total) by mouth 2 (two) times daily with a meal. 180 tablet 3  . DEXILANT 60 MG capsule Take 1 capsule by mouth daily.  1  . diltiazem (CARDIZEM CD) 180 MG 24 hr capsule Take 1 capsule (180 mg total) by mouth daily. 90 capsule 3  . fluticasone (FLONASE) 50 MCG/ACT nasal  spray Place 2 sprays into both nostrils daily.    . Probiotic CAPS Take 1 capsule by mouth daily.    . ranitidine (ZANTAC) 300 MG tablet TAKE 1 TABLET (300 MG TOTAL) BY MOUTH AT BEDTIME. 90 tablet 1  . traZODone (DESYREL) 50 MG tablet TAKE 1/2 TO 1 TABLET AT BEDTIME AS NEEDED FOR  SLEEP 90 tablet 1  . hyoscyamine (LEVBID) 0.375 MG 12 hr tablet Take 0.375 mg by mouth 3 (three) times daily as needed (colon spasms).    . methocarbamol (ROBAXIN) 500 MG tablet Take 1 tablet (500 mg total) by mouth every 8 (eight) hours as needed for muscle spasms. (Patient not taking: Reported on 03/03/2017) 15 tablet 0  . promethazine (PHENERGAN) 25 MG tablet Take 1 tablet (25 mg total) by mouth 3 (three) times  daily as needed for nausea or vomiting. (Patient not taking: Reported on 03/03/2017) 30 tablet 0   No current facility-administered medications on file prior to visit.     Allergies  Allergen Reactions  . Iodine-131 Other (See Comments)    Other reaction(s): Dizziness (intolerance)  . Sulfamethoxazole Shortness Of Breath    chest tightness  . Amitriptyline Anxiety and Other (See Comments)    Depression, insomnia  . Effexor [Venlafaxine]     palpitations  . Iohexol Other (See Comments)    Passed out  . Lactose Intolerance (Gi) Diarrhea and Nausea And Vomiting  . Lexapro [Escitalopram Oxalate]     nausea  . Iodinated Diagnostic Agents     Pt states at Dr.Grapeys office 15 years ago blacked out for 2 hours during injection for IVP. Pt says she had dye recently with no problems     Family History  Problem Relation Age of Onset  . Colon cancer Mother   . Diabetes Mother   . Hypertension Mother   . Colon polyps Father   . Parkinson's disease Father   . Colon cancer Maternal Grandmother   . Breast cancer Maternal Grandmother 21  . Diabetes Paternal Grandmother   . Breast cancer Paternal Grandmother 81  . Allergies Sister   . Arthritis Brother        b/l hip replace  . Alcohol abuse Daughter   . Arthritis Son   . Hypertension Son   . Allergies Son   . Osteoporosis Son   . Osteoporosis Son   . Arthritis Son        back disease, DDD  . Allergies Son   . Arthritis Son   . Irritable bowel syndrome Son        RSD  . Hypertension Paternal Grandfather   . Heart disease Paternal Grandfather   . Aneurysm Paternal Grandfather   . Colon cancer Maternal Aunt   . Colon cancer Cousin        X3  . Ovarian cancer Cousin     Social History   Social History  . Marital status: Married    Spouse name: N/A  . Number of children: 4  . Years of education: N/A   Occupational History  . Retired    Social History Main Topics  . Smoking status: Former Smoker    Quit date: 08/19/1995   . Smokeless tobacco: Never Used  . Alcohol use No  . Drug use: No  . Sexual activity: Not Currently    Birth control/ protection: Post-menopausal     Comment: 1st intercourse 78 yo-5 partners   Other Topics Concern  . None   Social History Narrative  .  None    Review of Systems - See HPI.  All other ROS are negative.  BP 132/72   Pulse 68   Temp 98 F (36.7 C) (Oral)   Resp 14   Ht '5\' 6"'$  (1.676 m)   Wt 161 lb (73 kg)   SpO2 98%   BMI 25.99 kg/m   Physical Exam  Constitutional: She is oriented to person, place, and time and well-developed, well-nourished, and in no distress.  HENT:  Head: Normocephalic and atraumatic.  Eyes: Conjunctivae are normal.  Neck: Neck supple.  Cardiovascular: Normal rate, regular rhythm, normal heart sounds and intact distal pulses.   Pulmonary/Chest: Breath sounds normal. No respiratory distress. She has no wheezes. She has no rales. She exhibits no tenderness.  Abdominal: Soft. Bowel sounds are normal. She exhibits no distension and no mass. There is no tenderness. There is no rebound and no guarding.  Neurological: She is alert and oriented to person, place, and time.  Skin: Skin is warm and dry. No rash noted.  Psychiatric: Affect normal.  Vitals reviewed.  Recent Results (from the past 2160 hour(s))  CBC w/Diff     Status: None   Collection Time: 12/04/16 10:42 AM  Result Value Ref Range   WBC 6.5 4.0 - 10.5 K/uL   RBC 4.75 3.87 - 5.11 Mil/uL   Hemoglobin 14.7 12.0 - 15.0 g/dL   HCT 44.2 36.0 - 46.0 %   MCV 93.1 78.0 - 100.0 fl   MCHC 33.3 30.0 - 36.0 g/dL   RDW 13.1 11.5 - 15.5 %   Platelets 339.0 150.0 - 400.0 K/uL   Neutrophils Relative % 52.1 43.0 - 77.0 %   Lymphocytes Relative 35.8 12.0 - 46.0 %   Monocytes Relative 9.1 3.0 - 12.0 %   Eosinophils Relative 2.0 0.0 - 5.0 %   Basophils Relative 1.0 0.0 - 3.0 %   Neutro Abs 3.4 1.4 - 7.7 K/uL   Lymphs Abs 2.3 0.7 - 4.0 K/uL   Monocytes Absolute 0.6 0.1 - 1.0 K/uL    Eosinophils Absolute 0.1 0.0 - 0.7 K/uL   Basophils Absolute 0.1 0.0 - 0.1 K/uL  Comp Met (CMET)     Status: None   Collection Time: 12/04/16 10:42 AM  Result Value Ref Range   Sodium 138 135 - 145 mEq/L   Potassium 4.8 3.5 - 5.1 mEq/L   Chloride 103 96 - 112 mEq/L   CO2 26 19 - 32 mEq/L   Glucose, Bld 96 70 - 99 mg/dL   BUN 7 6 - 23 mg/dL   Creatinine, Ser 0.70 0.40 - 1.20 mg/dL   Total Bilirubin 0.5 0.2 - 1.2 mg/dL   Alkaline Phosphatase 116 39 - 117 U/L   AST 16 0 - 37 U/L   ALT 16 0 - 35 U/L   Total Protein 7.7 6.0 - 8.3 g/dL   Albumin 4.7 3.5 - 5.2 g/dL   Calcium 9.6 8.4 - 10.5 mg/dL   GFR 88.88 >60.00 mL/min  Lipase     Status: Abnormal   Collection Time: 12/04/16 10:42 AM  Result Value Ref Range   Lipase 10.0 (L) 11.0 - 59.0 U/L  H. pylori antibody, IgG     Status: None   Collection Time: 12/04/16 10:42 AM  Result Value Ref Range   H Pylori IgG Negative Negative  Fecal occult blood, imunochemical     Status: None   Collection Time: 12/11/16  4:26 PM  Result Value Ref Range   Fecal Occult  Bld Negative Negative  CBC w/Diff     Status: None   Collection Time: 03/03/17  4:42 PM  Result Value Ref Range   WBC 7.3 3.8 - 10.8 K/uL   RBC 4.38 3.80 - 5.10 MIL/uL   Hemoglobin 13.6 11.7 - 15.5 g/dL   HCT 40.8 35.0 - 45.0 %   MCV 93.2 80.0 - 100.0 fL   MCH 31.1 27.0 - 33.0 pg   MCHC 33.3 32.0 - 36.0 g/dL   RDW 13.2 11.0 - 15.0 %   Platelets 356 140 - 400 K/uL   MPV 10.4 7.5 - 12.5 fL   Neutro Abs 3,723 1,500 - 7,800 cells/uL   Lymphs Abs 2,701 850 - 3,900 cells/uL   Monocytes Absolute 730 200 - 950 cells/uL   Eosinophils Absolute 146 15 - 500 cells/uL   Basophils Absolute 0 0 - 200 cells/uL   Neutrophils Relative % 51 %   Lymphocytes Relative 37 %   Monocytes Relative 10 %   Eosinophils Relative 2 %   Basophils Relative 0 %   Smear Review Criteria for review not met   Comp Met (CMET)     Status: Abnormal   Collection Time: 03/03/17  4:42 PM  Result Value Ref Range     Sodium 139 135 - 146 mmol/L   Potassium 4.1 3.5 - 5.3 mmol/L   Chloride 103 98 - 110 mmol/L   CO2 22 20 - 31 mmol/L   Glucose, Bld 94 65 - 99 mg/dL   BUN 6 (L) 7 - 25 mg/dL   Creat 0.74 0.50 - 0.99 mg/dL    Comment:   For patients > or = 67 years of age: The upper reference limit for Creatinine is approximately 13% higher for people identified as African-American.      Total Bilirubin 0.3 0.2 - 1.2 mg/dL   Alkaline Phosphatase 102 33 - 130 U/L   AST 15 10 - 35 U/L   ALT 16 6 - 29 U/L   Total Protein 6.9 6.1 - 8.1 g/dL   Albumin 4.2 3.6 - 5.1 g/dL   Calcium 9.5 8.6 - 10.4 mg/dL  Vitamin D (25 hydroxy)     Status: Abnormal   Collection Time: 03/03/17  4:42 PM  Result Value Ref Range   Vit D, 25-Hydroxy 29 (L) 30 - 100 ng/mL    Comment: Vitamin D Status           25-OH Vitamin D        Deficiency                <20 ng/mL        Insufficiency         20 - 29 ng/mL        Optimal             > or = 30 ng/mL   For 25-OH Vitamin D testing on patients on D2-supplementation and patients for whom quantitation of D2 and D3 fractions is required, the [facility] 25-OH VIT D, (D2,D3), LC/MS/MS is recommended: order code [id number] (patients > 2 yrs).   B12     Status: None   Collection Time: 03/03/17  4:42 PM  Result Value Ref Range   Vitamin B-12 477 200 - 1,100 pg/mL   Assessment/Plan: 1. Vitamin D deficiency + history with current fatigue. Repeat levels today.  - Vitamin D (25 hydroxy)  2. B12 deficiency + history. No supplementation in quite some time. Repeat levels today. - B12  3. Fatigue, unspecified type Suspect viral etiology however patient with history of multiple vitamin deficiencies. Labs today to assess. Discussed supportive measures and OTC medications. Symptoms are likely exacerbated by heat and humidity as air has not been working correctly in house. Discussed potential for staying with a friend or family member until air conditioning is fixed.  - CBC w/Diff -  Comp Met (CMET) S AS NEEDED FOR NAUSEA OR VOMITING  Dispense: 20 tablet; Refill: 0   Leeanne Rio, [place]

## 2017-03-03 NOTE — Progress Notes (Signed)
Pre visit review using our clinic review tool, if applicable. No additional management support is needed unless otherwise documented below in the visit note. 

## 2017-03-03 NOTE — Patient Instructions (Signed)
Please stay well hydrated.  Eat a bland diet. Take zofran as directed. I am checking blood work on you today. I will call with results.  Make sure to keep out of the heat. Please go stay with a relative if possible until the air conditioning is fixed at the house.

## 2017-03-04 ENCOUNTER — Other Ambulatory Visit: Payer: Self-pay | Admitting: Emergency Medicine

## 2017-03-04 DIAGNOSIS — R5383 Other fatigue: Secondary | ICD-10-CM

## 2017-03-04 LAB — VITAMIN D 25 HYDROXY (VIT D DEFICIENCY, FRACTURES): Vit D, 25-Hydroxy: 29 ng/mL — ABNORMAL LOW (ref 30–100)

## 2017-03-04 LAB — VITAMIN B12: Vitamin B-12: 477 pg/mL (ref 200–1100)

## 2017-03-04 MED ORDER — ONDANSETRON HCL 4 MG PO TABS: ORAL_TABLET | ORAL | 0 refills | Status: DC

## 2017-03-04 MED ORDER — VITAMIN D-3 125 MCG (5000 UT) PO TABS: 1.0000 | ORAL_TABLET | Freq: Every day | ORAL | 0 refills | Status: DC

## 2017-03-25 ENCOUNTER — Other Ambulatory Visit: Payer: Self-pay | Admitting: Family Medicine

## 2017-03-27 ENCOUNTER — Ambulatory Visit (INDEPENDENT_AMBULATORY_CARE_PROVIDER_SITE_OTHER): Payer: Medicare HMO | Admitting: Gynecology

## 2017-03-27 ENCOUNTER — Encounter: Payer: Self-pay | Admitting: Gynecology

## 2017-03-27 VITALS — BP 122/72 | Ht 65.0 in | Wt 159.0 lb

## 2017-03-27 DIAGNOSIS — Z01411 Encounter for gynecological examination (general) (routine) with abnormal findings: Secondary | ICD-10-CM

## 2017-03-27 DIAGNOSIS — M810 Age-related osteoporosis without current pathological fracture: Secondary | ICD-10-CM

## 2017-03-27 DIAGNOSIS — N8189 Other female genital prolapse: Secondary | ICD-10-CM

## 2017-03-27 DIAGNOSIS — N952 Postmenopausal atrophic vaginitis: Secondary | ICD-10-CM

## 2017-03-27 NOTE — Progress Notes (Signed)
    Megan [name] May 01, 1951 [id number]        65 y.o.  [id number] for breast and pelvic exam  Past medical history,surgical history, problem list, medications, allergies, family history and social history were all reviewed and documented as reviewed in the EPIC chart.  ROS:  Performed with pertinent positives and negatives included in the history, assessment and plan.   Additional significant findings :  None   Exam: Caryn Bee assistant Vitals:   03/27/17 1410  BP: 122/72  Weight: 159 lb (72.1 kg)  Height: 5\' 5"  (1.651 m)   Body mass index is 26.46 kg/m.  General appearance:  Normal affect, orientation and appearance. Skin: Grossly normal HEENT: Without gross lesions.  No cervical or supraclavicular adenopathy. Thyroid normal.  Lungs:  Clear without wheezing, rales or rhonchi Cardiac: RR, without RMG Abdominal:  Soft, nontender, without masses, guarding, rebound, organomegaly or hernia Breasts:  Examined lying and sitting without masses, retractions, discharge or axillary adenopathy. Pelvic:  Ext, BUS, Vagina: With atrophic changes. Versus second degree cystocele. Mild uterine prolapse. Mild rectocele  Cervix: With atrophic changes  Uterus: Anteverted, normal size, shape and contour, midline and mobile nontender   Adnexa: Without masses or tenderness    Anus and perineum: Normal   Rectovaginal: Normal sphincter tone without palpated masses or tenderness.    Assessment/Plan:  66 y.o. [id number] female for breast and pelvic exam  1. Postmenopausal/atrophic genital changes. No significant hot flushes, night sweats, vaginal dryness or any vaginal bleeding. Continue to monitor report any issues. 2. Pelvic relaxation with cystocele/rectocele/uterine prolapse. Asymptomatic to the patient. Not having any significant pressure symptoms or pain. Not having any bladder symptoms or issues with defecation. We'll continue to monitor and follow with annual exams. Will follow up sooner if  she develops any symptoms. 3. Osteoporosis. DEXA 2016 T score -2.4. FRAX 12%/2.4%. DEXA 2012 T score -2.5.  Recent blood work showed low vitamin D. She is on 2000 units daily. Recommended increasing to 4000 units daily. Plan repeat DEXA now 2 year interval and she will schedule follow up for this. 4. Colonoscopy 2016. Repeat at their recommended interval. 5. Mammography 12/2015. Patient overdue and she's going to call and schedule her mammogram. Breast exam normal today. Self breast awareness. 6. Pap smear/HPV 01/2015. No Pap smear done today. No history of significant abnormal Pap smear. Plan repeat Pap smear at 5 year interval per current screening guidelines. 7. Health maintenance. No routine lab work done as patient reports this done elsewhere. Follow up 1 year, sooner as needed.   Anastasio Auerbach MD, 2:40 PM 03/27/2017

## 2017-03-27 NOTE — Patient Instructions (Signed)
Follow up for the bone density study as scheduled.

## 2017-04-02 ENCOUNTER — Other Ambulatory Visit: Payer: Self-pay | Admitting: Family Medicine

## 2017-04-04 ENCOUNTER — Other Ambulatory Visit: Payer: Self-pay | Admitting: Physician Assistant

## 2017-04-08 ENCOUNTER — Ambulatory Visit (INDEPENDENT_AMBULATORY_CARE_PROVIDER_SITE_OTHER): Payer: Medicare HMO | Admitting: Physician Assistant

## 2017-04-08 ENCOUNTER — Encounter: Payer: Self-pay | Admitting: Physician Assistant

## 2017-04-08 VITALS — BP 138/82 | HR 69 | Temp 98.2°F | Resp 14 | Ht 66.0 in | Wt 160.2 lb

## 2017-04-08 DIAGNOSIS — R110 Nausea: Secondary | ICD-10-CM | POA: Diagnosis not present

## 2017-04-08 DIAGNOSIS — F341 Dysthymic disorder: Secondary | ICD-10-CM | POA: Diagnosis not present

## 2017-04-08 MED ORDER — HYOSCYAMINE SULFATE ER 0.375 MG PO TB12: 0.3750 mg | ORAL_TABLET | Freq: Two times a day (BID) | ORAL | 0 refills | Status: DC

## 2017-04-08 MED ORDER — FLUOXETINE HCL 20 MG PO TABS: 20.0000 mg | ORAL_TABLET | Freq: Every day | ORAL | 1 refills | Status: DC

## 2017-04-08 NOTE — Progress Notes (Signed)
Patient with PMH significant for Barrett Esophagus, GERD, chronic nausea, Benign neuroendocrine tumor presents to clinic today c/o recurrence of abdominal cramping and increased nausea after running out of her hyoscyamine. Is taking Dexilant as directed with significant control over her GERD symptoms.  Denies change in bowel or bladder habits, fever, chills, anorexia.   Patient has noted change in mood lately due to stressors with family. Causing depressed mood and anhedonia with tearfulness. Denies SI/HI. Would like to consider starting medication.   Past Medical History:  Diagnosis Date  . 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 Allergic state 12/31/2012  . Anxiety   . Asthma   . Barrett esophagus   . Blind loop syndrome   . C. difficile diarrhea   . Cancer ([place])   . Chest pain, atypical 07/14/2011  . Cold sore 07/17/2016  . Colon polyp   . Contact dermatitis 03/23/2012  . Cystocele, midline   . Depression   . Diarrhea   . Diverticulosis   . Endometrial hyperplasia 02/27/2006   BENIGN ENDO BX ON 02/2007  . GERD (gastroesophageal reflux disease)   . Headache 04/16/2015  . Headache(784.0) 04/15/2012  . Hematuria 04/27/2012  . Hepatic cyst   . Hiatal hernia   . Hiatal hernia   . Hyperlipidemia, mixed 12/17/2014  . Hypertension   . IBS (irritable bowel syndrome)   . Insomnia   . Leaky heart valve   . Low back pain 08/15/2013  . Osteoporosis 2016   T score-2.5 Lt femoral neck 2012, T score -2.4 2016 FRAX 12%/2.4%  . 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 Paronychia of great toe, left 06/19/2013  . Preventative health care 09/11/2014  . Rectal bleeding 10/10/2011  . Rectocele   . 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 Sinusitis acute 02/03/2012  . Thrush 07/21/2011  . 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 Unspecified hypothyroidism   . Unspecified menopausal and postmenopausal disorder   . Uterine prolapse without mention of vaginal wall prolapse   . Vitamin B12  deficiency   . Vitamin D deficiency 07/17/2016    Current Outpatient Prescriptions on File Prior to Visit  Medication Sig Dispense Refill  . albuterol (PROVENTIL HFA;VENTOLIN HFA) 108 (90 Base) MCG/ACT inhaler Inhale 2 puffs into the lungs every 6 (six) hours as needed for wheezing or shortness of breath. 1 Inhaler 5  . aspirin EC 81 MG tablet Take 1 tablet (81 mg total) by mouth daily.    Marland Kitchen azelastine (ASTELIN) 0.1 % nasal spray Place 1 spray into both nostrils 2 (two) times daily as needed for rhinitis. Use in each nostril as directed    . carvedilol (COREG) 12.5 MG tablet Take 1 tablet (12.5 mg total) by mouth 2 (two) times daily with a meal. 180 tablet 3  . Cholecalciferol (VITAMIN D-3) 5000 units TABS Take 1 tablet by mouth daily. 30 tablet 0  . DEXILANT 60 MG capsule Take 1 capsule by mouth daily.  1  . diltiazem (CARDIZEM CD) 180 MG 24 hr capsule Take 1 capsule (180 mg total) by mouth daily. 90 capsule 3  . fluticasone (FLONASE) 50 MCG/ACT nasal spray Place 2 sprays into both nostrils daily.    . methocarbamol (ROBAXIN) 500 MG tablet Take 1 tablet (500 mg total) by mouth every 8 (eight) hours as needed for muscle spasms. 15 tablet 0  . ondansetron (ZOFRAN) 4 MG tablet TAKE 1 TABLET(4 MG) BY MOUTH EVERY 8 HOURS AS NEEDED FOR NAUSEA OR VOMITING 20  tablet 0  . Probiotic CAPS Take 1 capsule by mouth daily.    . promethazine (PHENERGAN) 25 MG tablet TAKE 1 TABLET(25 MG) BY MOUTH THREE TIMES DAILY AS NEEDED FOR NAUSEA OR VOMITING 30 tablet 0  . ranitidine (ZANTAC) 300 MG tablet TAKE 1 TABLET (300 MG TOTAL) BY MOUTH AT BEDTIME. 90 tablet 1  . traZODone (DESYREL) 50 MG tablet TAKE 1/2 TO 1 TABLET AT BEDTIME AS NEEDED FOR  SLEEP 90 tablet 1   No current facility-administered medications on file prior to visit.     Allergies  Allergen Reactions  . Iodine-131 Other (See Comments)    Other reaction(s): Dizziness (intolerance)  . Sulfamethoxazole Shortness Of Breath    chest tightness  .  Amitriptyline Anxiety and Other (See Comments)    Depression, insomnia  . Effexor [Venlafaxine]     palpitations  . Iohexol Other (See Comments)    Passed out  . Lactose Intolerance (Gi) Diarrhea and Nausea And Vomiting  . Lexapro [Escitalopram Oxalate]     nausea  . Iodinated Diagnostic Agents     Pt states at Dr.Grapeys office 15 years ago blacked out for 2 hours during injection for IVP. Pt says she had dye recently with no problems     Family History  Problem Relation Age of Onset  . Colon cancer Mother   . Diabetes Mother   . Hypertension Mother   . Colon polyps Father   . Parkinson's disease Father   . Colon cancer Maternal Grandmother   . Breast cancer Maternal Grandmother 36  . Diabetes Paternal Grandmother   . Breast cancer Paternal Grandmother 62  . Allergies Sister   . Arthritis Brother        b/l hip replace  . Alcohol abuse Daughter   . Arthritis Son   . Hypertension Son   . Allergies Son   . Osteoporosis Son   . Osteoporosis Son   . Arthritis Son        back disease, DDD  . Allergies Son   . Arthritis Son   . Irritable bowel syndrome Son        RSD  . Hypertension Paternal Grandfather   . Heart disease Paternal Grandfather   . Aneurysm Paternal Grandfather   . Colon cancer Maternal Aunt   . Colon cancer Cousin        X3  . Ovarian cancer Cousin     Social History   Social History  . Marital status: Married    Spouse name: N/A  . Number of children: 4  . Years of education: N/A   Occupational History  . Retired    Social History Main Topics  . Smoking status: Former Smoker    Quit date: 08/19/1995  . Smokeless tobacco: Never Used  . Alcohol use No  . Drug use: No  . Sexual activity: Not Currently    Birth control/ protection: Post-menopausal     Comment: 1st intercourse 5 yo-5 partners   Other Topics Concern  . None   Social History Narrative  . None    Review of Systems - See HPI.  All other ROS are negative.  BP 138/82 (BP  Location: Left Arm, Patient Position: Sitting, Cuff Size: Normal)   Pulse 69   Temp 98.2 F (36.8 C) (Oral)   Resp 14   Ht _0  (1.676 m)   Wt 160 lb 3.2 oz (72.7 kg)   SpO2 98%   BMI 25.86 kg/m   Physical Exam  Constitutional: She is well-developed, well-nourished, and in no distress.  HENT:  Head: Normocephalic and atraumatic.  Eyes: Conjunctivae are normal.  Neck: Neck supple.  Cardiovascular: Normal rate, regular rhythm, normal heart sounds and intact distal pulses.   Pulmonary/Chest: Effort normal and breath sounds normal. No respiratory distress. She has no wheezes. She has no rales. She exhibits no tenderness.  Abdominal: Soft. Bowel sounds are normal. She exhibits no distension and no mass. There is no rebound and no guarding.  Chronic mild, generalized tenderness  Neurological: She is alert.  Skin: Skin is warm and dry. No rash noted.  Psychiatric: Affect normal.  Vitals reviewed.   Recent Results (from the past 2160 hour(s))  CBC w/Diff     Status: None   Collection Time: 03/03/17  4:42 PM  Result Value Ref Range   WBC 7.3 3.8 - 10.8 K/uL   RBC 4.38 3.80 - 5.10 MIL/uL   Hemoglobin 13.6 11.7 - 15.5 g/dL   HCT 40.8 35.0 - 45.0 %   MCV 93.2 80.0 - 100.0 fL   MCH 31.1 27.0 - 33.0 pg   MCHC 33.3 32.0 - 36.0 g/dL   RDW 13.2 11.0 - 15.0 %   Platelets 356 140 - 400 K/uL   MPV 10.4 7.5 - 12.5 fL   Neutro Abs 3,723 1,500 - 7,800 cells/uL   Lymphs Abs 2,701 850 - 3,900 cells/uL   Monocytes Absolute 730 200 - 950 cells/uL   Eosinophils Absolute 146 15 - 500 cells/uL   Basophils Absolute 0 0 - 200 cells/uL   Neutrophils Relative % 51 %   Lymphocytes Relative 37 %   Monocytes Relative 10 %   Eosinophils Relative 2 %   Basophils Relative 0 %   Smear Review Criteria for review not met   Comp Met (CMET)     Status: Abnormal   Collection Time: 03/03/17  4:42 PM  Result Value Ref Range   Sodium 139 135 - 146 mmol/L   Potassium 4.1 3.5 - 5.3 mmol/L   Chloride 103 98 -  110 mmol/L   CO2 22 20 - 31 mmol/L   Glucose, Bld 94 65 - 99 mg/dL   BUN 6 (L) 7 - 25 mg/dL   Creat 0.74 0.50 - 0.99 mg/dL    Comment:   For patients > or = 67 years of age: The upper reference limit for Creatinine is approximately 13% higher for people identified as African-American.      Total Bilirubin 0.3 0.2 - 1.2 mg/dL   Alkaline Phosphatase 102 33 - 130 U/L   AST 15 10 - 35 U/L   ALT 16 6 - 29 U/L   Total Protein 6.9 6.1 - 8.1 g/dL   Albumin 4.2 3.6 - 5.1 g/dL   Calcium 9.5 8.6 - 10.4 mg/dL  Vitamin D (25 hydroxy)     Status: Abnormal   Collection Time: 03/03/17  4:42 PM  Result Value Ref Range   Vit D, 25-Hydroxy 29 (L) 30 - 100 ng/mL    Comment: Vitamin D Status           25-OH Vitamin D        Deficiency                <20 ng/mL        Insufficiency         20 - 29 ng/mL        Optimal             >  or = 30 ng/mL   For 25-OH Vitamin D testing on patients on D2-supplementation and patients for whom quantitation of D2 and D3 fractions is required, the QuestAssureD 25-OH VIT D, (D2,D3), LC/MS/MS is recommended: order code [id number] (patients > 2 yrs).   B12     Status: None   Collection Time: 03/03/17  4:42 PM  Result Value Ref Range   Vitamin B-12 477 200 - 1,100 pg/mL    Assessment/Plan: ANXIETY DEPRESSION Would like to consider restarting Prozac as she felt she did well on this previously. Will start at low dose and titrate according to tolerance of medication. Follow-up scheduled.   Nausea Chronic. Patient with GERD and IBS. Will restart hyoscyamine. Continue PPI. Feel anxiety/depression are major contributors. We are restarting low-dose of SSRI. Follow-up scheduled.     Leeanne Rio, PA-C

## 2017-04-08 NOTE — Progress Notes (Signed)
Pre visit review using our clinic review tool, if applicable. No additional management support is needed unless otherwise documented below in the visit note. 

## 2017-04-08 NOTE — Patient Instructions (Signed)
Please continue Dexilant and Align. Keep well-hydrated and get plenty of rest.  Restart the Hyoscyamine as directed. Let me know if symptoms are not improving.   Please start the Fluoxetine once daily as directed. Follow-up with me in 2 weeks.  If tolerating, will increase dose of medication. Please consider scheduling an appointment with counseling using the handout given.

## 2017-04-09 MED FILL — FLUoxetine HCL 20 MG TABS: 20 | 30 days supply | Qty: 30 | Fill #0

## 2017-04-09 MED FILL — OSCIMIN SR 0.375 MG TABLET: 0.375 | 30 days supply | Qty: 60 | Fill #0

## 2017-04-10 ENCOUNTER — Other Ambulatory Visit: Payer: Self-pay | Admitting: Physician Assistant

## 2017-04-10 DIAGNOSIS — R110 Nausea: Secondary | ICD-10-CM

## 2017-04-10 DIAGNOSIS — F341 Dysthymic disorder: Secondary | ICD-10-CM

## 2017-04-12 DIAGNOSIS — R110 Nausea: Secondary | ICD-10-CM | POA: Insufficient documentation

## 2017-04-12 NOTE — Assessment & Plan Note (Signed)
Chronic. Patient with GERD and IBS. Will restart hyoscyamine. Continue PPI. Feel anxiety/depression are major contributors. We are restarting low-dose of SSRI. Follow-up scheduled.

## 2017-04-12 NOTE — Assessment & Plan Note (Signed)
Would like to consider restarting Prozac as she felt she did well on this previously. Will start at low dose and titrate according to tolerance of medication. Follow-up scheduled.

## 2017-04-14 ENCOUNTER — Ambulatory Visit (INDEPENDENT_AMBULATORY_CARE_PROVIDER_SITE_OTHER): Payer: Medicare HMO

## 2017-04-14 DIAGNOSIS — M810 Age-related osteoporosis without current pathological fracture: Secondary | ICD-10-CM | POA: Diagnosis not present

## 2017-04-15 ENCOUNTER — Telehealth: Payer: Self-pay | Admitting: Gynecology

## 2017-04-15 ENCOUNTER — Other Ambulatory Visit: Payer: Self-pay | Admitting: Emergency Medicine

## 2017-04-15 ENCOUNTER — Encounter: Payer: Self-pay | Admitting: Physician Assistant

## 2017-04-15 ENCOUNTER — Other Ambulatory Visit: Payer: Self-pay | Admitting: Physician Assistant

## 2017-04-15 ENCOUNTER — Encounter: Payer: Self-pay | Admitting: Gynecology

## 2017-04-15 ENCOUNTER — Encounter: Payer: Self-pay | Admitting: *Deleted

## 2017-04-15 ENCOUNTER — Telehealth: Payer: Self-pay

## 2017-04-15 DIAGNOSIS — R5383 Other fatigue: Secondary | ICD-10-CM

## 2017-04-15 NOTE — Telephone Encounter (Signed)
Sent pt mychart message

## 2017-04-15 NOTE — Telephone Encounter (Signed)
Tell patient that her current bone density again shows osteoporosis. Does not appear that she has lost significant bone compared to her prior studies. Based on the diagnosis though she still is considered at increased risk for fracture. She did have some loss at the left hip overall. We discussed possibilities for treatment in the past. If she would like to rediscuss treatment options then recommend office visit. She needs to make sure she is taking her extra vitamin D.

## 2017-04-15 NOTE — Telephone Encounter (Signed)
PA initiated via Covermymeds; KEY: [id number]. Awaiting determination.

## 2017-04-21 NOTE — Telephone Encounter (Signed)
PA Case: [id number], Status: Approved, Coverage Starts on: 04/15/2017 12:00:00 AM, Coverage Ends on: 04/15/2019 12:00:00 AM. Questions? Contact [phone].

## 2017-04-22 ENCOUNTER — Other Ambulatory Visit: Payer: Self-pay | Admitting: Family Medicine

## 2017-04-29 ENCOUNTER — Other Ambulatory Visit: Payer: Self-pay | Admitting: Emergency Medicine

## 2017-04-29 MED ORDER — RANITIDINE HCL 300 MG PO TABS: 300.0000 mg | ORAL_TABLET | Freq: Every day | ORAL | 1 refills | Status: DC

## 2017-05-12 ENCOUNTER — Telehealth: Payer: Self-pay | Admitting: Physician Assistant

## 2017-05-12 ENCOUNTER — Encounter: Payer: Self-pay | Admitting: Physician Assistant

## 2017-05-12 ENCOUNTER — Ambulatory Visit (INDEPENDENT_AMBULATORY_CARE_PROVIDER_SITE_OTHER): Payer: Medicare HMO | Admitting: Physician Assistant

## 2017-05-12 DIAGNOSIS — F341 Dysthymic disorder: Secondary | ICD-10-CM | POA: Diagnosis not present

## 2017-05-12 MED ORDER — ALPRAZOLAM 1 MG PO TABS: 1.0000 mg | ORAL_TABLET | Freq: Two times a day (BID) | ORAL | 0 refills | Status: DC | PRN

## 2017-05-12 NOTE — Telephone Encounter (Signed)
We are faxing in now.

## 2017-05-12 NOTE — Progress Notes (Signed)
Pre visit review using our clinic review tool, if applicable. No additional management support is needed unless otherwise documented below in the visit note. 

## 2017-05-12 NOTE — Telephone Encounter (Signed)
Pt states that she went to pharmacy to pick up Rx for Xanax and it was not there, pt asked that this be called in today, please advise

## 2017-05-12 NOTE — Patient Instructions (Addendum)
Please use the handout given to schedule an appointment for with Megan [name]. I think she will be able to help you with your chronic issues.  Please continue the Xanax as directed. I will work on getting you scheduled with GI for a follow-up.  Follow-up with me in 2 weeks so I can see how you are doing. Call us anytime if you need [place].
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
## 2017-05-14 DIAGNOSIS — T22212A Burn of second degree of left forearm, initial encounter: Secondary | ICD-10-CM | POA: Diagnosis not present

## 2017-05-14 DIAGNOSIS — Z23 Encounter for immunization: Secondary | ICD-10-CM | POA: Diagnosis not present

## 2017-05-15 ENCOUNTER — Encounter: Payer: Self-pay | Admitting: Physician Assistant

## 2017-05-15 DIAGNOSIS — G4700 Insomnia, unspecified: Secondary | ICD-10-CM | POA: Diagnosis not present

## 2017-05-15 DIAGNOSIS — G4733 Obstructive sleep apnea (adult) (pediatric): Secondary | ICD-10-CM | POA: Diagnosis not present

## 2017-05-15 DIAGNOSIS — F329 Major depressive disorder, single episode, unspecified: Secondary | ICD-10-CM | POA: Diagnosis not present
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
## 2017-05-19 ENCOUNTER — Encounter: Payer: Self-pay | Admitting: Physician Assistant

## 2017-05-19 ENCOUNTER — Ambulatory Visit (INDEPENDENT_AMBULATORY_CARE_PROVIDER_SITE_OTHER): Payer: Medicare HMO | Admitting: Physician Assistant

## 2017-05-19 VITALS — BP 126/70 | HR 76 | Temp 98.2°F | Resp 14 | Ht 66.0 in | Wt 161.0 lb

## 2017-05-19 DIAGNOSIS — X102XXA Contact with fats and cooking oils, initial encounter: Secondary | ICD-10-CM

## 2017-05-19 DIAGNOSIS — T22212D Burn of second degree of left forearm, subsequent encounter: Secondary | ICD-10-CM | POA: Diagnosis not present
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
## 2017-05-19 NOTE — Patient Instructions (Signed)
Please keep skin clean and dry. Keep changing dressings three to four times daily. Finish entire course of antibiotics. Follow-up with me in 1 week.

## 2017-05-19 NOTE — Progress Notes (Signed)
Pre visit review using our clinic review tool, if applicable. No additional management support is needed unless otherwise documented below in the visit note. 

## 2017-05-21 ENCOUNTER — Encounter: Payer: Self-pay | Admitting: Physician Assistant

## 2017-05-21 DIAGNOSIS — G4733 Obstructive sleep apnea (adult) (pediatric): Secondary | ICD-10-CM | POA: Insufficient documentation

## 2017-05-25 ENCOUNTER — Ambulatory Visit: Payer: Medicare HMO | Admitting: Psychology

## 2017-05-26 ENCOUNTER — Encounter: Payer: Self-pay | Admitting: Physician Assistant

## 2017-05-26 ENCOUNTER — Ambulatory Visit (INDEPENDENT_AMBULATORY_CARE_PROVIDER_SITE_OTHER): Payer: Medicare HMO | Admitting: Physician Assistant

## 2017-05-26 VITALS — BP 128/70 | HR 71 | Temp 98.3°F | Resp 14 | Ht 66.0 in | Wt 161.0 lb

## 2017-05-26 DIAGNOSIS — T22212A Burn of second degree of left forearm, initial encounter: Secondary | ICD-10-CM

## 2017-05-26 DIAGNOSIS — X102XXA Contact with fats and cooking oils, initial encounter: Secondary | ICD-10-CM | POA: Diagnosis not present

## 2017-05-26 MED ORDER — ALBUTEROL SULFATE HFA 108 (90 BASE) MCG/ACT IN AERS: 2.0000 | INHALATION_SPRAY | Freq: Four times a day (QID) | RESPIRATORY_TRACT | 0 refills | Status: DC | PRN
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
## 2017-05-26 NOTE — Patient Instructions (Signed)
Please keep up the good work with dressing changes. You can cut back to three times daily. This should continue healing over the next several weeks.  If you note any worsening pain any drainage or fever, please return immediately.

## 2017-05-26 NOTE — Progress Notes (Signed)
Pre visit review using our clinic review tool, if applicable. No additional management support is needed unless otherwise documented below in the visit note. 

## 2017-05-27 ENCOUNTER — Encounter: Payer: Self-pay | Admitting: Physician Assistant

## 2017-05-27 MED ORDER — AMOXICILLIN-POT CLAVULANATE 875-125 MG PO TABS: 1.0000 | ORAL_TABLET | Freq: Two times a day (BID) | ORAL | 0 refills | Status: DC

## 2017-05-29 ENCOUNTER — Ambulatory Visit: Payer: Self-pay | Admitting: Physician Assistant

## 2017-05-29 ENCOUNTER — Encounter: Payer: Self-pay | Admitting: Physician Assistant

## 2017-05-29 ENCOUNTER — Ambulatory Visit (INDEPENDENT_AMBULATORY_CARE_PROVIDER_SITE_OTHER): Payer: Medicare HMO | Admitting: Physician Assistant

## 2017-05-29 VITALS — BP 118/70 | HR 70 | Temp 97.9°F | Resp 14 | Ht 66.0 in | Wt 161.0 lb

## 2017-05-29 DIAGNOSIS — R1013 Epigastric pain: Secondary | ICD-10-CM

## 2017-05-29 DIAGNOSIS — W548XXA Other contact with dog, initial encounter: Secondary | ICD-10-CM | POA: Diagnosis not present

## 2017-05-29 DIAGNOSIS — S93492A Sprain of other ligament of left ankle, initial encounter: Secondary | ICD-10-CM | POA: Diagnosis not present

## 2017-05-29 MED ORDER — DOXYCYCLINE HYCLATE 100 MG PO CAPS: 100.0000 mg | ORAL_CAPSULE | Freq: Two times a day (BID) | ORAL | 0 refills | Status: DC
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
## 2017-05-29 NOTE — Progress Notes (Signed)
Pre visit review using our clinic review tool, if applicable. No additional management support is needed unless otherwise documented below in the visit note. 

## 2017-05-29 NOTE — Progress Notes (Signed)
Patient presents to clinic today for follow-up if forearm burn s/p recent trauma from a dog scratch. Patient was started on Augmentin to prevent infection giving high risk due to animal scratch and pre-existing burn. Patient is taking as directed. Has noted increased GERD and some epigastric pain since starting. Denies vomiting, fever, chills. Has kept wound clean and dry. Denies any increased pain, redness. Denies any purulent drainage.  Patient also endorses pain and swelling of lateral left ankle. Denies known trauma or injury. Has been present for a few days. Swelling is mostly resolved but patient notes pain with ambulation.   Past Medical History:  Diagnosis Date  . 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 Allergic state 12/31/2012  . Anxiety   . Asthma   . Barrett esophagus   . Blind loop syndrome   . C. difficile diarrhea   . Cancer ([place])   . Chest pain, atypical 07/14/2011  . Cold sore 07/17/2016  . Colon polyp   . Contact dermatitis 03/23/2012  . Cystocele, midline   . Depression   . Diarrhea   . Diverticulosis   . Endometrial hyperplasia 02/27/2006   BENIGN ENDO BX ON 02/2007  . GERD (gastroesophageal reflux disease)   . Headache 04/16/2015  . Headache(784.0) 04/15/2012  . Hematuria 04/27/2012  . Hepatic cyst   . Hiatal hernia   . Hiatal hernia   . Hyperlipidemia, mixed 12/17/2014  . Hypertension   . IBS (irritable bowel syndrome)   . Insomnia   . Leaky heart valve   . Low back pain 08/15/2013  . Osteoporosis 03/2017   T score -2.5. Without significant loss from prior studies  . 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 Paronychia of great toe, left 06/19/2013  . Preventative health care 09/11/2014  . Rectal bleeding 10/10/2011  . Rectocele   . 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 Sinusitis acute 02/03/2012  . Thrush 07/21/2011  . 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 Unspecified hypothyroidism   . Unspecified menopausal and postmenopausal disorder   .  Uterine prolapse without mention of vaginal wall prolapse   . Vitamin B12 deficiency   . Vitamin D deficiency 07/17/2016    Current Outpatient Prescriptions on File Prior to Visit  Medication Sig Dispense Refill  . albuterol (PROVENTIL HFA;VENTOLIN HFA) 108 (90 Base) MCG/ACT inhaler Inhale 2 puffs into the lungs every 6 (six) hours as needed for wheezing or shortness of breath. 1 Inhaler 0  . ALPRAZolam (XANAX) 1 MG tablet Take 1 tablet (1 mg total) by mouth 2 (two) times daily as needed for anxiety. 60 tablet 0  . aspirin EC 81 MG tablet Take 1 tablet (81 mg total) by mouth daily.    Marland Kitchen azelastine (ASTELIN) 0.1 % nasal spray Place 1 spray into both nostrils 2 (two) times daily as needed for rhinitis. Use in each nostril as directed    . carvedilol (COREG) 12.5 MG tablet Take 1 tablet (12.5 mg total) by mouth 2 (two) times daily with a meal. (Patient taking differently: Take 12.5 mg by mouth daily. ) 180 tablet 3  . Cholecalciferol (VITAMIN D-3) 5000 units TABS Take 1 tablet by mouth daily. 30 tablet 0  . DEXILANT 60 MG capsule Take 1 capsule by mouth daily.  1  . fluticasone (FLONASE) 50 MCG/ACT nasal spray Place 2 sprays into both nostrils daily.    . hyoscyamine (LEVBID) 0.375 MG 12 hr tablet Take 1 tablet (0.375 mg total) by mouth 2 (two) times daily.  60 tablet 0  . ondansetron (ZOFRAN) 4 MG tablet TAKE 1 TABLET BY MOUTH EVERY 8 HOURS AS NEEDED FOR NAUSEA AND VOMITING 20 tablet 3  . Probiotic CAPS Take 1 capsule by mouth daily.    . promethazine (PHENERGAN) 25 MG tablet TAKE 1 TABLET(25 MG) BY MOUTH THREE TIMES DAILY AS NEEDED FOR NAUSEA OR VOMITING 30 tablet 0  . ranitidine (ZANTAC) 300 MG tablet Take 1 tablet (300 mg total) by mouth at bedtime. 90 tablet 1  . sucralfate (CARAFATE) 1 g tablet Take 1 g by mouth 4 (four) times daily.    Marland Kitchen diltiazem (CARDIZEM CD) 180 MG 24 hr capsule Take 1 capsule (180 mg total) by mouth daily. 90 capsule 3   No current facility-administered medications on  file prior to visit.     Allergies  Allergen Reactions  . Iodine-131 Other (See Comments)    Other reaction(s): Dizziness (intolerance)  . Sulfamethoxazole Shortness Of Breath    chest tightness  . Amitriptyline Anxiety and Other (See Comments)    Depression, insomnia  . Effexor [Venlafaxine]     palpitations  . Iohexol Other (See Comments)    Passed out  . Lactose Intolerance (Gi) Diarrhea and Nausea And Vomiting  . Lexapro [Escitalopram Oxalate]     nausea  . Iodinated Diagnostic Agents     Pt states at Dr.Grapeys office 15 years ago blacked out for 2 hours during injection for IVP. Pt says she had dye recently with no problems     Family History  Problem Relation Age of Onset  . Colon cancer Mother   . Diabetes Mother   . Hypertension Mother   . Colon polyps Father   . Parkinson's disease Father   . Colon cancer Maternal Grandmother   . Breast cancer Maternal Grandmother 37  . Diabetes Paternal Grandmother   . Breast cancer Paternal Grandmother 43  . Allergies Sister   . Arthritis Brother        b/l hip replace  . Alcohol abuse Daughter   . Arthritis Son   . Hypertension Son   . Allergies Son   . Osteoporosis Son   . Osteoporosis Son   . Arthritis Son        back disease, DDD  . Allergies Son   . Arthritis Son   . Irritable bowel syndrome Son        RSD  . Hypertension Paternal Grandfather   . Heart disease Paternal Grandfather   . Aneurysm Paternal Grandfather   . Colon cancer Maternal Aunt   . Colon cancer Cousin        X3  . Ovarian cancer Cousin     Social History   Social History  . Marital status: Married    Spouse name: N/A  . Number of children: 4  . Years of education: N/A   Occupational History  . Retired    Social History Main Topics  . Smoking status: Former Smoker    Quit date: 08/19/1995  . Smokeless tobacco: Never Used  . Alcohol use No  . Drug use: No  . Sexual activity: Not Currently    Birth control/ protection:  Post-menopausal     Comment: 1st intercourse 8 yo-5 partners   Other Topics Concern  . None   Social History Narrative  . None    Review of Systems - See HPI.  All other ROS are negative.  BP 118/70   Pulse 70   Temp 97.9 F (36.6 C) (Oral)  Resp 14   Ht '5\' 6"'$  (1.676 m)   Wt 161 lb (73 kg)   SpO2 97%   BMI 25.99 kg/m   Physical Exam  Constitutional: She is oriented to person, place, and time and well-developed, well-nourished, and in no distress.  HENT:  Head: Normocephalic and atraumatic.  Eyes: Conjunctivae are normal.  Neck: Neck supple.  Cardiovascular: Normal rate, regular rhythm, normal heart sounds and intact distal pulses.   Pulmonary/Chest: Effort normal and breath sounds normal. No respiratory distress. She has no wheezes. She has no rales. She exhibits no tenderness.  Abdominal: Soft. Bowel sounds are normal. She exhibits no distension and no mass. There is no tenderness. There is no rebound and no guarding.  Musculoskeletal:       Left ankle: She exhibits swelling. She exhibits normal range of motion. Tenderness. AITFL tenderness found. No lateral malleolus, no medial malleolus, no posterior TFL, no head of 5th metatarsal and no proximal fibula tenderness found.  Neurological: She is alert and oriented to person, place, and time.  Vitals reviewed.   Recent Results (from the past 2160 hour(s))  CBC w/Diff     Status: None   Collection Time: 03/03/17  4:42 PM  Result Value Ref Range   WBC 7.3 3.8 - 10.8 K/uL   RBC 4.38 3.80 - 5.10 MIL/uL   Hemoglobin 13.6 11.7 - 15.5 g/dL   HCT 40.8 35.0 - 45.0 %   MCV 93.2 80.0 - 100.0 fL   MCH 31.1 27.0 - 33.0 pg   MCHC 33.3 32.0 - 36.0 g/dL   RDW 13.2 11.0 - 15.0 %   Platelets 356 140 - 400 K/uL   MPV 10.4 7.5 - 12.5 fL   Neutro Abs 3,723 1,500 - 7,800 cells/uL   Lymphs Abs 2,701 850 - 3,900 cells/uL   Monocytes Absolute 730 200 - 950 cells/uL   Eosinophils Absolute 146 15 - 500 cells/uL   Basophils Absolute 0 0  - 200 cells/uL   Neutrophils Relative % 51 %   Lymphocytes Relative 37 %   Monocytes Relative 10 %   Eosinophils Relative 2 %   Basophils Relative 0 %   Smear Review Criteria for review not met   Comp Met (CMET)     Status: Abnormal   Collection Time: 03/03/17  4:42 PM  Result Value Ref Range   Sodium 139 135 - 146 mmol/L   Potassium 4.1 3.5 - 5.3 mmol/L   Chloride 103 98 - 110 mmol/L   CO2 22 20 - 31 mmol/L   Glucose, Bld 94 65 - 99 mg/dL   BUN 6 (L) 7 - 25 mg/dL   Creat 0.74 0.50 - 0.99 mg/dL    Comment:   For patients > or = 67 years of age: The upper reference limit for Creatinine is approximately 13% higher for people identified as African-American.      Total Bilirubin 0.3 0.2 - 1.2 mg/dL   Alkaline Phosphatase 102 33 - 130 U/L   AST 15 10 - 35 U/L   ALT 16 6 - 29 U/L   Total Protein 6.9 6.1 - 8.1 g/dL   Albumin 4.2 3.6 - 5.1 g/dL   Calcium 9.5 8.6 - 10.4 mg/dL  Vitamin D (25 hydroxy)     Status: Abnormal   Collection Time: 03/03/17  4:42 PM  Result Value Ref Range   Vit D, 25-Hydroxy 29 (L) 30 - 100 ng/mL    Comment: Vitamin D Status  25-OH Vitamin D        Deficiency                <20 ng/mL        Insufficiency         20 - 29 ng/mL        Optimal             > or = 30 ng/mL   For 25-OH Vitamin D testing on patients on D2-supplementation and patients for whom quantitation of D2 and D3 fractions is required, the QuestAssureD 25-OH VIT D, (D2,D3), LC/MS/MS is recommended: order code [id number] (patients > 2 yrs).   B12     Status: None   Collection Time: 03/03/17  4:42 PM  Result Value Ref Range   Vitamin B-12 477 200 - 1,100 pg/mL    Assessment/Plan: 1. Epigastric pain EKG obtained --unchanged from prior tracings. Seems secondary to antibiotic that has flared her GERD. Will stop Augmentin and change to Doxycycline. Supportive measures reviewed. Resume GERD medications - EKG 12-Lead  2. Dog scratch Augmentin stopped due to intolerance. Start Doxy.  Wound care again reviewed. Area looks very good today. Strict return precautions given. - doxycycline (VIBRAMYCIN) 100 MG capsule; Take 1 capsule (100 mg total) by mouth 2 (two) times daily.  Dispense: 14 capsule; Refill: 0  3. Sprain of anterior talofibular ligament of left ankle, initial encounter Brace applied. Start RICE therapy. OTC medications reviewed.   Leeanne Rio, PA-C

## 2017-06-05 ENCOUNTER — Other Ambulatory Visit: Payer: Self-pay | Admitting: Physician Assistant

## 2017-06-16 ENCOUNTER — Other Ambulatory Visit: Payer: Self-pay | Admitting: Emergency Medicine

## 2017-06-16 MED ORDER — ALPRAZOLAM 1 MG PO TABS: 1.0000 mg | ORAL_TABLET | Freq: Two times a day (BID) | ORAL | 0 refills | Status: DC | PRN

## 2017-06-16 NOTE — Telephone Encounter (Signed)
[facility] advising patient the rx is ready at the front desk for pick up. She is due for a UDS.

## 2017-06-16 NOTE — Telephone Encounter (Signed)
Xanax last filled 05/12/17 #60 UDS: 08/26/16 failed screen [facility]: 07/25/16 Last OV: 05/29/17  Please advise

## 2017-06-16 NOTE — Telephone Encounter (Signed)
Will grant 30 tablets. UDS at time of pick up. Rx printed and signed.

## 2017-06-17 ENCOUNTER — Other Ambulatory Visit: Payer: Medicare HMO

## 2017-06-17 DIAGNOSIS — Z79899 Other long term (current) drug therapy: Secondary | ICD-10-CM | POA: Diagnosis not present

## 2017-06-20 LAB — PAIN MGMT, PROFILE 8 W/CONF, U
6 ACETYLMORPHINE: NEGATIVE ng/mL (ref <10)
ALPHAHYDROXYMIDAZOLAM: NEGATIVE ng/mL (ref <50)
ALPHAHYDROXYTRIAZOLAM: NEGATIVE ng/mL (ref <50)
AMPHETAMINES: NEGATIVE ng/mL (ref <500)
Alcohol Metabolites: NEGATIVE ng/mL (ref <500)
Alphahydroxyalprazolam: 841 ng/mL — ABNORMAL HIGH (ref <25)
Aminoclonazepam: NEGATIVE ng/mL (ref <25)
BENZODIAZEPINES: POSITIVE ng/mL — AB (ref <100)
Buprenorphine, Urine: NEGATIVE ng/mL (ref <5)
CREATININE: 94.4 mg/dL
Cocaine Metabolite: NEGATIVE ng/mL (ref <150)
HYDROXYETHYLFLURAZEPAM: NEGATIVE ng/mL (ref <50)
Lorazepam: NEGATIVE ng/mL (ref <50)
MDMA: NEGATIVE ng/mL (ref <500)
Marijuana Metabolite: NEGATIVE ng/mL (ref <20)
NORDIAZEPAM: NEGATIVE ng/mL (ref <50)
OPIATES: NEGATIVE ng/mL (ref <100)
OXAZEPAM: NEGATIVE ng/mL (ref <50)
OXIDANT: NEGATIVE ug/mL (ref <200)
OXYCODONE: NEGATIVE ng/mL (ref <100)
PH: 7.02 (ref 4.5–9.0)
Temazepam: NEGATIVE ng/mL (ref <50)

## 2017-06-22 ENCOUNTER — Ambulatory Visit: Payer: Medicare HMO | Admitting: Physician Assistant

## 2017-06-22 ENCOUNTER — Encounter: Payer: Self-pay | Admitting: Physician Assistant

## 2017-06-22 ENCOUNTER — Other Ambulatory Visit: Payer: Self-pay | Admitting: Physician Assistant

## 2017-06-22 VITALS — BP 140/76 | HR 67 | Temp 97.7°F | Resp 14 | Ht 66.0 in | Wt 164.0 lb

## 2017-06-22 DIAGNOSIS — B9689 Other specified bacterial agents as the cause of diseases classified elsewhere: Secondary | ICD-10-CM

## 2017-06-22 DIAGNOSIS — J019 Acute sinusitis, unspecified: Secondary | ICD-10-CM

## 2017-06-22 DIAGNOSIS — G44209 Tension-type headache, unspecified, not intractable: Secondary | ICD-10-CM

## 2017-06-22 MED ORDER — TIZANIDINE HCL 2 MG PO TABS: 1.0000 mg | ORAL_TABLET | Freq: Four times a day (QID) | ORAL | 0 refills | Status: DC | PRN

## 2017-06-22 MED ORDER — DOXYCYCLINE HYCLATE 100 MG PO CAPS: 100.0000 mg | ORAL_CAPSULE | Freq: Two times a day (BID) | ORAL | 0 refills | Status: DC

## 2017-06-22 MED ORDER — AMOXICILLIN-POT CLAVULANATE 875-125 MG PO TABS: 1.0000 | ORAL_TABLET | Freq: Two times a day (BID) | ORAL | 0 refills | Status: DC

## 2017-06-22 NOTE — Progress Notes (Signed)
Pre visit review using our clinic review tool, if applicable. No additional management support is needed unless otherwise documented below in the visit note. 

## 2017-06-22 NOTE — Patient Instructions (Signed)
Please take antibiotic as directed.  Increase fluid intake.  Use Saline nasal spray.  Take a daily multivitamin. Use Tizanidine to help with muscle tension.  Place a humidifier in the bedroom.  Please call or return clinic if symptoms are not improving.  Sinusitis Sinusitis is redness, soreness, and swelling (inflammation) of the paranasal sinuses. Paranasal sinuses are air pockets within the bones of your face (beneath the eyes, the middle of the forehead, or above the eyes). In healthy paranasal sinuses, mucus is able to drain out, and air is able to circulate through them by way of your nose. However, when your paranasal sinuses are inflamed, mucus and air can become trapped. This can allow bacteria and other germs to grow and cause infection. Sinusitis can develop quickly and last only a short time (acute) or continue over a long period (chronic). Sinusitis that lasts for more than 12 weeks is considered chronic.  CAUSES  Causes of sinusitis include:  Allergies.  Structural abnormalities, such as displacement of the cartilage that separates your nostrils (deviated septum), which can decrease the air flow through your nose and sinuses and affect sinus drainage.  Functional abnormalities, such as when the small hairs (cilia) that line your sinuses and help remove mucus do not work properly or are not present. SYMPTOMS  Symptoms of acute and chronic sinusitis are the same. The primary symptoms are pain and pressure around the affected sinuses. Other symptoms include:  Upper toothache.  Earache.  Headache.  Bad breath.  Decreased sense of smell and taste.  A cough, which worsens when you are lying flat.  Fatigue.  Fever.  Thick drainage from your nose, which often is green and may contain pus (purulent).  Swelling and warmth over the affected sinuses. DIAGNOSIS  Your caregiver will perform a physical exam. During the exam, your caregiver may:  Look in your nose for signs of  abnormal growths in your nostrils (nasal polyps).  Tap over the affected sinus to check for signs of infection.  View the inside of your sinuses (endoscopy) with a special imaging device with a light attached (endoscope), which is inserted into your sinuses. If your caregiver suspects that you have chronic sinusitis, one or more of the following tests may be recommended:  Allergy tests.  Nasal culture A sample of mucus is taken from your nose and sent to a lab and screened for bacteria.  Nasal cytology A sample of mucus is taken from your nose and examined by your caregiver to determine if your sinusitis is related to an allergy. TREATMENT  Most cases of acute sinusitis are related to a viral infection and will resolve on their own within 10 days. Sometimes medicines are prescribed to help relieve symptoms (pain medicine, decongestants, nasal steroid sprays, or saline sprays).  However, for sinusitis related to a bacterial infection, your caregiver will prescribe antibiotic medicines. These are medicines that will help kill the bacteria causing the infection.  Rarely, sinusitis is caused by a fungal infection. In theses cases, your caregiver will prescribe antifungal medicine. For some cases of chronic sinusitis, surgery is needed. Generally, these are cases in which sinusitis recurs more than 3 times per year, despite other treatments. HOME CARE INSTRUCTIONS   Drink plenty of water. Water helps thin the mucus so your sinuses can drain more easily.  Use a humidifier.  Inhale steam 3 to 4 times a day (for example, sit in the bathroom with the shower running).  Apply a warm, moist washcloth to your  face 3 to 4 times a day, or as directed by your caregiver.  Use saline nasal sprays to help moisten and clean your sinuses.  Take over-the-counter or prescription medicines for pain, discomfort, or fever only as directed by your caregiver. SEEK IMMEDIATE MEDICAL CARE IF:  You have increasing  pain or severe headaches.  You have nausea, vomiting, or drowsiness.  You have swelling around your face.  You have vision problems.  You have a stiff neck.  You have difficulty breathing. MAKE SURE YOU:   Understand these instructions.  Will watch your condition.  Will get help right away if you are not doing well or get worse. Document Released: 08/04/2005 Document Revised: 10/27/2011 Document Reviewed: 08/19/2011 [facility] Patient Information 2014 [place], [place].

## 2017-06-22 NOTE — Progress Notes (Signed)
Patient presents to clinic today c/o sinus pressure, sinus pain, sinus headache, ear pressure and nasal congestion. Denies fever, chills. Has noted cough and some wheeze. States symptoms started after husband was cutting a lot of wood and brought sawdust into the house.   Past Medical History:  Diagnosis Date  . 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 Allergic state 12/31/2012  . Anxiety   . Asthma   . Barrett esophagus   . Blind loop syndrome   . C. difficile diarrhea   . Cancer ([place])   . Chest pain, atypical 07/14/2011  . Cold sore 07/17/2016  . Colon polyp   . Contact dermatitis 03/23/2012  . Cystocele, midline   . Depression   . Diarrhea   . Diverticulosis   . Endometrial hyperplasia 02/27/2006   BENIGN ENDO BX ON 02/2007  . GERD (gastroesophageal reflux disease)   . Headache 04/16/2015  . Headache(784.0) 04/15/2012  . Hematuria 04/27/2012  . Hepatic cyst   . Hiatal hernia   . Hiatal hernia   . Hyperlipidemia, mixed 12/17/2014  . Hypertension   . IBS (irritable bowel syndrome)   . Insomnia   . Leaky heart valve   . Low back pain 08/15/2013  . Osteoporosis 03/2017   T score -2.5. Without significant loss from prior studies  . 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 Paronychia of great toe, left 06/19/2013  . Preventative health care 09/11/2014  . Rectal bleeding 10/10/2011  . Rectocele   . 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 Sinusitis acute 02/03/2012  . Thrush 07/21/2011  . 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 Unspecified hypothyroidism   . Unspecified menopausal and postmenopausal disorder   . Uterine prolapse without mention of vaginal wall prolapse   . Vitamin B12 deficiency   . Vitamin D deficiency 07/17/2016    Current Outpatient Medications on File Prior to Visit  Medication Sig Dispense Refill  . albuterol (PROVENTIL HFA;VENTOLIN HFA) 108 (90 Base) MCG/ACT inhaler Inhale 2 puffs into the lungs every 6 (six) hours as needed for wheezing or  shortness of breath. 1 Inhaler 0  . ALPRAZolam (XANAX) 1 MG tablet Take 1 tablet (1 mg total) by mouth 2 (two) times daily as needed for anxiety. 30 tablet 0  . aspirin EC 81 MG tablet Take 1 tablet (81 mg total) by mouth daily.    Marland Kitchen azelastine (ASTELIN) 0.1 % nasal spray Place 1 spray into both nostrils 2 (two) times daily as needed for rhinitis. Use in each nostril as directed    . carvedilol (COREG) 12.5 MG tablet Take 1 tablet (12.5 mg total) by mouth 2 (two) times daily with a meal. (Patient taking differently: Take 12.5 mg by mouth daily. ) 180 tablet 3  . Cholecalciferol (VITAMIN D-3) 5000 units TABS Take 1 tablet by mouth daily. 30 tablet 0  . DEXILANT 60 MG capsule Take 1 capsule by mouth daily.  1  . DEXILANT 60 MG capsule TAKE ONE CAPSULE BY MOUTH DAILY 30 capsule 6  . doxycycline (VIBRAMYCIN) 100 MG capsule Take 1 capsule (100 mg total) by mouth 2 (two) times daily. 14 capsule 0  . fluticasone (FLONASE) 50 MCG/ACT nasal spray Place 2 sprays into both nostrils daily.    . hyoscyamine (LEVBID) 0.375 MG 12 hr tablet Take 1 tablet (0.375 mg total) by mouth 2 (two) times daily. 60 tablet 0  . ondansetron (ZOFRAN) 4 MG tablet TAKE 1 TABLET BY MOUTH EVERY 8 HOURS AS NEEDED FOR NAUSEA  AND VOMITING 20 tablet 3  . Probiotic CAPS Take 1 capsule by mouth daily.    . promethazine (PHENERGAN) 25 MG tablet TAKE 1 TABLET(25 MG) BY MOUTH THREE TIMES DAILY AS NEEDED FOR NAUSEA OR VOMITING 30 tablet 0  . ranitidine (ZANTAC) 300 MG tablet Take 1 tablet (300 mg total) by mouth at bedtime. 90 tablet 1  . sucralfate (CARAFATE) 1 g tablet Take 1 g by mouth 4 (four) times daily.    Marland Kitchen diltiazem (CARDIZEM CD) 180 MG 24 hr capsule Take 1 capsule (180 mg total) by mouth daily. 90 capsule 3   No current facility-administered medications on file prior to visit.     Allergies  Allergen Reactions  . Iodine-131 Other (See Comments)    Other reaction(s): Dizziness (intolerance)  . Sulfamethoxazole Shortness Of  Breath    chest tightness  . Amitriptyline Anxiety and Other (See Comments)    Depression, insomnia  . Effexor [Venlafaxine]     palpitations  . Iohexol Other (See Comments)    Passed out  . Lactose Intolerance (Gi) Diarrhea and Nausea And Vomiting  . Lexapro [Escitalopram Oxalate]     nausea  . Iodinated Diagnostic Agents     Pt states at Dr.Grapeys office 15 years ago blacked out for 2 hours during injection for IVP. Pt says she had dye recently with no problems     Family History  Problem Relation Age of Onset  . Colon cancer Mother   . Diabetes Mother   . Hypertension Mother   . Colon polyps Father   . Parkinson's disease Father   . Colon cancer Maternal Grandmother   . Breast cancer Maternal Grandmother 69  . Diabetes Paternal Grandmother   . Breast cancer Paternal Grandmother 37  . Allergies Sister   . Arthritis Brother        b/l hip replace  . Alcohol abuse Daughter   . Arthritis Son   . Hypertension Son   . Allergies Son   . Osteoporosis Son   . Osteoporosis Son   . Arthritis Son        back disease, DDD  . Allergies Son   . Arthritis Son   . Irritable bowel syndrome Son        RSD  . Hypertension Paternal Grandfather   . Heart disease Paternal Grandfather   . Aneurysm Paternal Grandfather   . Colon cancer Maternal Aunt   . Colon cancer Cousin        X3  . Ovarian cancer Cousin     Social History   Socioeconomic History  . Marital status: Married    Spouse name: None  . Number of children: 4  . Years of education: None  . Highest education level: None  Social Needs  . Financial resource strain: None  . Food insecurity - worry: None  . Food insecurity - inability: None  . Transportation needs - medical: None  . Transportation needs - non-medical: None  Occupational History  . Occupation: Retired  Tobacco Use  . Smoking status: Former Smoker    Last attempt to quit: 08/19/1995    Years since quitting: 21.8  . Smokeless tobacco: Never Used    Substance and Sexual Activity  . Alcohol use: No    Alcohol/week: 0.0 oz  . Drug use: No  . Sexual activity: Not Currently    Birth control/protection: Post-menopausal    Comment: 1st intercourse 56 yo-5 partners  Other Topics Concern  . None  Social History  Narrative  . None   Review of Systems - See HPI.  All other ROS are negative.  BP 140/76   Pulse 67   Temp 97.7 F (36.5 C) (Oral)   Resp 14   Ht 5\' 6"  (1.676 m)   Wt 164 lb (74.4 kg)   SpO2 98%   BMI 26.47 kg/m   Physical Exam  Constitutional: She is oriented to person, place, and time and well-developed, well-nourished, and in no distress.  HENT:  Head: Normocephalic.  Right Ear: Tympanic membrane and external ear normal.  Left Ear: Tympanic membrane and external ear normal.  Nose: Right sinus exhibits maxillary sinus tenderness. Left sinus exhibits maxillary sinus tenderness.  Mouth/Throat: Uvula is midline, oropharynx is clear and moist and mucous membranes are normal.  Eyes: Conjunctivae are normal.  Neck: Neck supple. Muscular tenderness present. No spinous process tenderness present. No neck rigidity. Normal range of motion present.  Cardiovascular: Normal rate, regular rhythm, normal heart sounds and intact distal pulses.  Pulmonary/Chest: Effort normal and breath sounds normal. No respiratory distress. She has no wheezes. She has no rales. She exhibits no tenderness.  Neurological: She is alert and oriented to person, place, and time.  Skin: Skin is warm and dry. No rash noted.  Psychiatric: Affect normal.  Vitals reviewed.   Recent Results (from the past 2160 hour(s))  Pain Mgmt, Profile 8 w/Conf, U     Status: Abnormal   Collection Time: 06/17/17  2:20 PM  Result Value Ref Range   Creatinine 94.4 > or = 20. mg/dL   pH 7.02 4.5 - 9.0   Oxidant NEGATIVE <200 mcg/mL   Amphetamines NEGATIVE <500 ng/mL   medMATCH Amphetamines CONSISTENT    Benzodiazepines POSITIVE (A) <100 ng/mL    Alphahydroxyalprazolam 841 (H) <25 ng/mL    Comment: See Note 1   medMATCH aOH alprazolam INCONSISTENT    Alphahydroxymidazolam NEGATIVE <50 ng/mL    Comment: See Note 1   medMATCH aOH midazolam CONSISTENT    Alphahydroxytriazolam NEGATIVE <50 ng/mL    Comment: See Note 1   medMATCH aOH triazolam CONSISTENT    Aminoclonazepam NEGATIVE <25 ng/mL    Comment: See Note 1   medMATCH Aminoclonazepam CONSISTENT    Hydroxyethylflurazepam NEGATIVE <50 ng/mL    Comment: See Note 1   medMATCH OH,Et flurazepam CONSISTENT    Lorazepam NEGATIVE <50 ng/mL    Comment: See Note 1   medMATCH Lorazepam CONSISTENT    Nordiazepam NEGATIVE <50 ng/mL    Comment: See Note 1   medMATCH Nordiazepam CONSISTENT    Oxazepam NEGATIVE <50 ng/mL    Comment: See Note 1   medMATCH Oxazepam CONSISTENT    Temazepam NEGATIVE <50 ng/mL    Comment: See Note 1   medMATCH Temazepam CONSISTENT    Marijuana Metabolite NEGATIVE <20 ng/mL   medMATCH Marijuana Metab CONSISTENT    Cocaine Metabolite NEGATIVE <150 ng/mL   medMATCH Cocaine Metab CONSISTENT    Opiates NEGATIVE <100 ng/mL   medMATCH Opiates CONSISTENT    Oxycodone NEGATIVE <100 ng/mL   medMATCH Oxycodone CONSISTENT     Comment: Note 1 . This test was developed and its analytical performance  characteristics have been determined by [employer]. It has not been cleared or approved by the FDA. This assay has been validated pursuant to the CLIA  regulations and is used for clinical purposes.    Buprenorphine, Urine NEGATIVE <5 ng/mL   medMATCH Buprenorphine CONSISTENT    MDMA NEGATIVE <500 ng/mL  [facility] MDMA CONSISTENT    Alcohol Metabolites NEGATIVE <500 ng/mL   medMATCH Alcohol Metab CONSISTENT    6 Acetylmorphine NEGATIVE <10 ng/mL   medMATCH 6 Acetylmorphine CONSISTENT     Comment: This drug testing is for medical treatment only.   Analysis was performed as non-forensic testing and  these results should be used only by healthcare   providers to render diagnosis or treatment, or to  monitor progress of medical conditions. Hazel Sams comments are:  - present when drug test results may be the result of     metabolism of one or more drugs or when results are     inconsistent with prescribed medication(s) listed.  - may be blank when drug results are consistent with     prescribed medication(s) listed. . For assistance with interpreting these drug results,  please contact a [employer] Toxicology  Specialist: [phone] [place] [phone]), M-F,  8am-6pm EST. This drug testing is for medical treatment only.   Analysis was performed as non-forensic testing and  these results should be used only by healthcare  providers to render diagnosis or treatment, or to  monitor progress of medical conditions. Hazel Sams comments are:  - present when  drug test results may be the result of     metabolism of one or more drugs or when results are     inconsistent with prescribed medication(s) listed.  - may be blank when drug results are consistent with     prescribed medication(s) listed. . For assistance with interpreting these drug results,  please contact a [employer] Toxicology  Specialist: [phone] [place] [phone]), M-F,  8am-6pm EST. This drug testing is for medical treatment only.   Analysis was performed as non-forensic testing and  these results should be used only by healthcare  providers to render diagnosis or treatment, or to  monitor progress of medical conditions. Hazel Sams comments are:  - present when drug test results may be the result of     metabolism of one or more drugs or when results are     inconsistent with prescribed medication(s) listed.  - may be blank when drug results are consistent with     prescribed medication(s) listed. . For assistance with interpreting these d rug results,  please contact a [employer] Toxicology  Specialist: [phone] [place]  [phone]), M-F,  8am-6pm EST. This drug testing is for medical treatment only.   Analysis was performed as non-forensic testing and  these results should be used only by healthcare  providers to render diagnosis or treatment, or to  monitor progress of medical conditions. Hazel Sams comments are:  - present when drug test results may be the result of     metabolism of one or more drugs or when results are     inconsistent with prescribed medication(s) listed.  - may be blank when drug results are consistent with     prescribed medication(s) listed. . For assistance with interpreting these drug results,  please contact a [employer] Toxicology  Specialist: [phone] [place] [phone]), M-F,  8am-6pm EST. This drug testing is for medical treatment only.   Analysis was performed as non-forensic testing and  these results should be used only by healthcare  provi ders to render diagnosis or treatment, or to  monitor progress of medical conditions. Hazel Sams comments are:  - present when drug test results may be the result of     metabolism of one or more drugs or when results are  inconsistent with prescribed medication(s) listed.  - may be blank when drug results are consistent with     prescribed medication(s) listed. . For assistance with interpreting these drug results,  please contact a [employer] Toxicology  Specialist: [phone] [place] [phone]), M-F,  8am-6pm EST.    Assessment/Plan: 1. Acute bacterial sinusitis Rx Augmentin.  Increase fluids.  Rest.  Saline nasal spray.  Probiotic.  Mucinex as directed.  Humidifier in bedroom. Albuterol as directed.  Call or return to clinic if symptoms are not improving.   2. Tension headache Start tizanidine and supportive measures.  - tiZANidine (ZANAFLEX) 2 MG tablet; Take 0.5-1 tablets (1-2 mg total) every 6 (six) hours as needed by mouth for muscle spasms.  Dispense: 30 tablet; Refill:  0   Leeanne Rio, [place]

## 2017-06-26 DIAGNOSIS — K5904 Chronic idiopathic constipation: Secondary | ICD-10-CM | POA: Diagnosis not present

## 2017-06-26 DIAGNOSIS — Z23 Encounter for immunization: Secondary | ICD-10-CM | POA: Diagnosis not present

## 2017-06-26 DIAGNOSIS — D3A8 Other benign neuroendocrine tumors: Secondary | ICD-10-CM | POA: Diagnosis not present

## 2017-06-26 DIAGNOSIS — K219 Gastro-esophageal reflux disease without esophagitis: Secondary | ICD-10-CM | POA: Diagnosis not present

## 2017-06-26 DIAGNOSIS — M6289 Other specified disorders of muscle: Secondary | ICD-10-CM | POA: Diagnosis not present

## 2017-06-26 DIAGNOSIS — R140 Abdominal distension (gaseous): Secondary | ICD-10-CM | POA: Diagnosis not present

## 2017-06-26 DIAGNOSIS — R5382 Chronic fatigue, unspecified: Secondary | ICD-10-CM | POA: Diagnosis not present

## 2017-06-26 DIAGNOSIS — R1013 Epigastric pain: Secondary | ICD-10-CM | POA: Diagnosis not present

## 2017-07-06 ENCOUNTER — Encounter: Payer: Self-pay | Admitting: Physician Assistant

## 2017-07-06 MED ORDER — FLUCONAZOLE 150 MG PO TABS: 150.0000 mg | ORAL_TABLET | Freq: Once | ORAL | 0 refills | Status: AC

## 2017-07-10 ENCOUNTER — Other Ambulatory Visit: Payer: Self-pay | Admitting: Cardiovascular Disease

## 2017-07-10 ENCOUNTER — Other Ambulatory Visit: Payer: Self-pay | Admitting: Family Medicine

## 2017-07-10 DIAGNOSIS — I10 Essential (primary) hypertension: Secondary | ICD-10-CM

## 2017-07-10 DIAGNOSIS — R002 Palpitations: Secondary | ICD-10-CM

## 2017-07-21 ENCOUNTER — Encounter: Payer: Self-pay | Admitting: Cardiovascular Disease

## 2017-07-21 ENCOUNTER — Other Ambulatory Visit: Payer: Self-pay | Admitting: Cardiovascular Disease

## 2017-07-21 DIAGNOSIS — R002 Palpitations: Secondary | ICD-10-CM

## 2017-07-21 DIAGNOSIS — I10 Essential (primary) hypertension: Secondary | ICD-10-CM

## 2017-07-21 MED ORDER — CARVEDILOL 12.5 MG PO TABS: 12.5000 mg | ORAL_TABLET | Freq: Two times a day (BID) | ORAL | 0 refills | Status: DC

## 2017-07-21 NOTE — Telephone Encounter (Signed)
Pt's medication was sent to pt's pharmacy as requested. Confirmation received.  °

## 2017-07-24 ENCOUNTER — Encounter: Payer: Self-pay | Admitting: Physician Assistant

## 2017-07-27 ENCOUNTER — Encounter: Payer: Self-pay | Admitting: Gynecology

## 2017-07-28 ENCOUNTER — Other Ambulatory Visit: Payer: Self-pay | Admitting: Gynecology

## 2017-07-28 MED ORDER — FLUCONAZOLE 150 MG PO TABS: 150.0000 mg | ORAL_TABLET | Freq: Once | ORAL | 0 refills | Status: AC

## 2017-07-28 NOTE — Telephone Encounter (Signed)
Okay for Diflucan 150 mg x 1 dose 

## 2017-08-03 ENCOUNTER — Other Ambulatory Visit: Payer: Self-pay | Admitting: Physician Assistant

## 2017-08-03 DIAGNOSIS — G44209 Tension-type headache, unspecified, not intractable: Secondary | ICD-10-CM

## 2017-08-04 ENCOUNTER — Other Ambulatory Visit: Payer: Self-pay | Admitting: Emergency Medicine

## 2017-08-04 MED ORDER — PROMETHAZINE HCL 25 MG PO TABS: ORAL_TABLET | ORAL | 0 refills | Status: DC

## 2017-08-05 ENCOUNTER — Other Ambulatory Visit: Payer: Self-pay | Admitting: Physician Assistant

## 2017-08-05 ENCOUNTER — Telehealth: Payer: Self-pay | Admitting: Physician Assistant

## 2017-08-05 DIAGNOSIS — G44209 Tension-type headache, unspecified, not intractable: Secondary | ICD-10-CM

## 2017-08-05 MED ORDER — ALPRAZOLAM 1 MG PO TABS: 1.0000 mg | ORAL_TABLET | Freq: Two times a day (BID) | ORAL | 0 refills | Status: DC | PRN

## 2017-08-05 NOTE — Telephone Encounter (Addendum)
Copied from [place] [id number]. Topic: Quick Communication - See Telephone Encounter >> Aug 05, 2017 12:04 PM Ivar Drape wrote: CRM for notification. See Telephone encounter for:  08/05/17. Patient would like her Xanax 1mg  medication refilled.

## 2017-08-05 NOTE — Telephone Encounter (Signed)
Rx refill printed and signed. Faxed to local pharmacy.

## 2017-08-05 NOTE — Telephone Encounter (Signed)
Last OV 06/22/17 Alprazolam last filled 06/16/17 #30 with 0

## 2017-08-11 ENCOUNTER — Other Ambulatory Visit: Payer: Self-pay | Admitting: Physician Assistant

## 2017-08-13 ENCOUNTER — Telehealth: Payer: Self-pay | Admitting: Physician Assistant

## 2017-08-13 DIAGNOSIS — J45901 Unspecified asthma with (acute) exacerbation: Secondary | ICD-10-CM

## 2017-08-13 NOTE — Telephone Encounter (Signed)
She will need an appointment to discuss. She and I have never discussed at visits with me and her previous PCP does not have Sleep apnea listed in her problem list or medial history. I can set her up with Pulmonology for sleep assessment as well as she will have to have formal diagnosis before she can have a CPAP. Are they getting this confused with a nebulizer machine -- you do not put medicine in a CPAP machine. You put medicine in a nebulizer machine for a breathing treatment.

## 2017-08-13 NOTE — Telephone Encounter (Signed)
Copied from [place] [id number]. Topic: Quick Communication - See Telephone Encounter >> Aug 13, 2017  2:30 PM Ivar Drape wrote: CRM for notification. See Telephone encounter for:  08/13/17. Patient's husband called in and stated the patient needs a C-PAP breathing machine.  He stated it's the one you can put medicine in.  They would like for this to be sent in to the [facility] because they have already checked and the insurance company will pay for for both the machine and the medication that you put inside it.

## 2017-08-14 NOTE — Telephone Encounter (Signed)
Speaking for Megan [name], I would think she still needs an appt in order to write a DME order for a nebulizer machine

## 2017-08-14 NOTE — Telephone Encounter (Signed)
Spoke with patient and she is talking about a nebulizer machine and the medicine.  She states that insurance has agreed that they will pay for it.

## 2017-08-14 NOTE — Telephone Encounter (Signed)
Patient states that she and Einar Pheasant discussed this "a few months ago" and he was supposed to send it in for her but never did.   Advised patient that Einar Pheasant is out of the office until next week and that we will see what needs to be done when he returns.

## 2017-08-17 ENCOUNTER — Ambulatory Visit (HOSPITAL_COMMUNITY): Payer: Medicare HMO | Attending: Cardiovascular Disease

## 2017-08-17 DIAGNOSIS — I071 Rheumatic tricuspid insufficiency: Secondary | ICD-10-CM | POA: Diagnosis not present

## 2017-08-17 DIAGNOSIS — E782 Mixed hyperlipidemia: Secondary | ICD-10-CM | POA: Insufficient documentation

## 2017-08-17 DIAGNOSIS — I119 Hypertensive heart disease without heart failure: Secondary | ICD-10-CM | POA: Diagnosis not present

## 2017-08-17 DIAGNOSIS — I10 Essential (primary) hypertension: Secondary | ICD-10-CM | POA: Diagnosis not present

## 2017-08-17 NOTE — Telephone Encounter (Signed)
Patient uses albuterol during illness when she gets a flare of her reactive airway disease. If she would like a nebulizer, I am ok with this. Ok to send an order for a nebulizer machine to [facility]. Also ok to send in the albuterol neb solution to her pharmacy with 0 refills.

## 2017-08-21 MED ORDER — ALBUTEROL SULFATE (2.5 MG/3ML) 0.083% IN NEBU: 2.5000 mg | INHALATION_SOLUTION | Freq: Four times a day (QID) | RESPIRATORY_TRACT | 1 refills | Status: DC | PRN

## 2017-08-21 NOTE — Telephone Encounter (Signed)
Placed the order for the nebulizer machine rx and medication to local pharmacy. Faxed order to [facility].

## 2017-08-21 NOTE — Addendum Note (Signed)
Addended by: Leonidas Romberg on: 08/21/2017 10:26 AM   Modules accepted: Orders

## 2017-08-24 DIAGNOSIS — J45909 Unspecified asthma, uncomplicated: Secondary | ICD-10-CM | POA: Diagnosis not present

## 2017-08-26 ENCOUNTER — Telehealth: Payer: Self-pay | Admitting: *Deleted

## 2017-08-26 DIAGNOSIS — J45901 Unspecified asthma with (acute) exacerbation: Secondary | ICD-10-CM

## 2017-08-26 MED ORDER — ALBUTEROL SULFATE (2.5 MG/3ML) 0.083% IN NEBU: 2.5000 mg | INHALATION_SOLUTION | Freq: Four times a day (QID) | RESPIRATORY_TRACT | 3 refills | Status: DC | PRN

## 2017-08-26 NOTE — Addendum Note (Signed)
Addended by: Leonidas Romberg on: 08/26/2017 11:47 AM   Modules accepted: Orders

## 2017-08-26 NOTE — Telephone Encounter (Signed)
Copied from [place] [id number]. Topic: Inquiry >> Aug 25, 2017  4:18 PM Conception Chancy, NT wrote: Reason for CRM: patient states she received her nebulizer machine and would like the medication to be called into [facility] that goes inside. Please advise

## 2017-08-26 NOTE — Telephone Encounter (Signed)
Sent albuterol nebulizer solution to the mail order pharmacy for patient

## 2017-08-31 ENCOUNTER — Other Ambulatory Visit: Payer: Self-pay | Admitting: Physician Assistant

## 2017-08-31 ENCOUNTER — Encounter: Payer: Self-pay | Admitting: Cardiovascular Disease

## 2017-08-31 ENCOUNTER — Ambulatory Visit: Payer: Medicare HMO | Admitting: Cardiovascular Disease

## 2017-08-31 VITALS — BP 120/70 | HR 70 | Ht 66.0 in

## 2017-08-31 DIAGNOSIS — G8929 Other chronic pain: Secondary | ICD-10-CM

## 2017-08-31 DIAGNOSIS — R1013 Epigastric pain: Principal | ICD-10-CM

## 2017-08-31 DIAGNOSIS — I071 Rheumatic tricuspid insufficiency: Secondary | ICD-10-CM | POA: Diagnosis not present

## 2017-08-31 DIAGNOSIS — I10 Essential (primary) hypertension: Secondary | ICD-10-CM

## 2017-08-31 MED ORDER — HYDROCHLOROTHIAZIDE 12.5 MG PO CAPS: 12.5000 mg | ORAL_CAPSULE | Freq: Every day | ORAL | 11 refills | Status: DC

## 2017-08-31 NOTE — Patient Instructions (Signed)
Medication Instructions:  1) STOP COREG 2) START HCTZ 12.5 mg daily  Labwork: Your provider recommends that you return for lab work in [place].  Testing/Procedures: None  Follow-Up: Your provider wants you to follow-up in: 1 year with Dr. Burt Knack. You will receive a reminder letter in the mail two months in advance. If you don't receive a letter, please call our office to schedule the follow-up appointment.    Any Other Special Instructions Will Be Listed Below (If Applicable).     If you need a refill on your cardiac medications before your next appointment, please call your pharmacy.

## 2017-08-31 NOTE — Progress Notes (Signed)
Cardiology Office Note Date:  09/02/2017   ID:  Megan [name], Megan [name] 1950/11/28, MRN [id number]  PCP:  Brunetta Jeans, PA-C  Cardiologist:  Sherren Mocha, MD    Chief Complaint  Patient presents with  . Abdominal Pain     History of Present Illness: Megan [name] is a 67 y.o. female who presents for follow-up evaluation. She's been followed for chest pain, palpitations, and moderate tricuspid regurgitation.   The patient is here alone today.  She been doing fine from a cardiac perspective, but is having a lot of problems that she attributes to her stomach.  She complains of weight loss, epigastric discomfort after eating, and near constant abdominal pain.  She has undergone extensive GI evaluation and is followed closely.  She complains of increasing fatigue and depression.  He has some nausea after she takes her morning blood pressure medicines.  She denies chest pain or pressure.  No shortness of breath, orthopnea, or PND.  She does complain of edema.  Past Medical History:  Diagnosis Date  . 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 Allergic state 12/31/2012  . Anxiety   . Asthma   . Barrett esophagus   . Blind loop syndrome   . C. difficile diarrhea   . Cancer ([place])   . Chest pain, atypical 07/14/2011  . Cold sore 07/17/2016  . Colon polyp   . Contact dermatitis 03/23/2012  . Cystocele, midline   . Depression   . Diarrhea   . Diverticulosis   . Endometrial hyperplasia 02/27/2006   BENIGN ENDO BX ON 02/2007  . GERD (gastroesophageal reflux disease)   . Headache 04/16/2015  . Headache(784.0) 04/15/2012  . Hematuria 04/27/2012  . Hepatic cyst   . Hiatal hernia   . Hiatal hernia   . Hyperlipidemia, mixed 12/17/2014  . Hypertension   . IBS (irritable bowel syndrome)   . Insomnia   . Leaky heart valve   . Low back pain 08/15/2013  . Osteoporosis 03/2017   T score -2.5. Without significant loss from prior studies  . Pancreatic cyst  04/19/2013  . Paronychia of great toe, left 06/19/2013  . Preventative health care 09/11/2014  . Rectal bleeding 10/10/2011  . Rectocele   . 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 Sinusitis acute 02/03/2012  . Thrush 07/21/2011  . 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 Unspecified hypothyroidism   . Unspecified menopausal and postmenopausal disorder   . Uterine prolapse without mention of vaginal wall prolapse   . Vitamin B12 deficiency   . Vitamin D deficiency 07/17/2016    Past Surgical History:  Procedure Laterality Date  . APPENDECTOMY    . endoscopic hemoclip    . HYSTEROSCOPY     POLYP  . PERIPHERAL VASCULAR CATHETERIZATION Left 07/24/2016   Procedure: Renal Angiography;  Surgeon: Conrad Fairfield, MD;  Location: [facility];  Service: Cardiovascular;  Laterality: Left;  . UPPER GI ENDOSCOPY    . UTERINE FIBROID SURGERY      Current Outpatient Medications  Medication Sig Dispense Refill  . albuterol (PROVENTIL HFA;VENTOLIN HFA) 108 (90 Base) MCG/ACT inhaler Inhale 2 puffs into the lungs every 6 (six) hours as needed for wheezing or shortness of breath. 1 Inhaler 0  . albuterol (PROVENTIL) (2.5 MG/3ML) 0.083% nebulizer solution Take 3 mLs (2.5 mg total) by nebulization every 6 (six) hours as needed for wheezing or shortness of breath. 150 mL 3  . ALPRAZolam (XANAX) 1 MG tablet Take  1 tablet (1 mg total) by mouth 2 (two) times daily as needed for anxiety. 30 tablet 0  . aspirin EC 81 MG tablet Take 1 tablet (81 mg total) by mouth daily.    Marland Kitchen azelastine (ASTELIN) 0.1 % nasal spray Place 1 spray into both nostrils 2 (two) times daily as needed for rhinitis. Use in each nostril as directed    . CARTIA XT 180 MG 24 hr capsule TAKE 1 CAPSULE (180 MG TOTAL) BY MOUTH DAILY. 90 capsule 3  . Cholecalciferol (VITAMIN D-3) 5000 units TABS Take 1 tablet by mouth daily. 30 tablet 0  . fluticasone (FLONASE) 50 MCG/ACT nasal spray Place 2 sprays into both nostrils daily.    . hyoscyamine (LEVBID)  0.375 MG 12 hr tablet Take 1 tablet (0.375 mg total) by mouth 2 (two) times daily. 60 tablet 0  . omeprazole (PRILOSEC) 40 MG capsule Take by mouth.    . ondansetron (ZOFRAN) 4 MG tablet TAKE 1 TABLET BY MOUTH EVERY 8 HOURS AS NEEDED FOR NAUSEA AND VOMITING 20 tablet 3  . Probiotic CAPS Take 1 capsule by mouth daily.    . promethazine (PHENERGAN) 25 MG tablet TAKE 1 TABLET(25 MG) BY MOUTH THREE TIMES DAILY AS NEEDED FOR NAUSEA OR VOMITING 30 tablet 0  . ranitidine (ZANTAC) 300 MG tablet Take 1 tablet (300 mg total) by mouth at bedtime. 90 tablet 1  . sucralfate (CARAFATE) 1 g tablet Take 1 g by mouth 4 (four) times daily.    Marland Kitchen tiZANidine (ZANAFLEX) 2 MG tablet TAKE 1/2 TO 1 TABLET BY MOUTH EVERY 6 HOURS AS NEEDED FOR MUSCLE SPASMS 30 tablet 0  . traZODone (DESYREL) 50 MG tablet TAKE 1/2 TO 1 TABLET AT BEDTIME AS NEEDED FOR  SLEEP 90 tablet 1  . hydrochlorothiazide (MICROZIDE) 12.5 MG capsule Take 1 capsule (12.5 mg total) by mouth daily. 30 capsule 11   No current facility-administered medications for this visit.     Allergies:   Iodine-131; Sulfamethoxazole; Amitriptyline; Effexor [venlafaxine]; Iohexol; Lactose intolerance (gi); Lexapro [escitalopram oxalate]; and Iodinated diagnostic agents   Social History:  The patient  reports that she quit smoking about 22 years ago. she has never used smokeless tobacco. She reports that she does not drink alcohol or use drugs.   Family History:  The patient's  family history includes Alcohol abuse in her daughter; Allergies in her sister, son, and son; Aneurysm in her paternal grandfather; Arthritis in her brother, son, son, and son; Breast cancer (age of onset: 67) in her paternal grandmother; Breast cancer (age of onset: 30) in her maternal grandmother; Colon cancer in her cousin, maternal aunt, maternal grandmother, and mother; Colon polyps in her father; Diabetes in her mother and paternal grandmother; Heart disease in her paternal grandfather;  Hypertension in her mother, paternal grandfather, and son; Irritable bowel syndrome in her son; Osteoporosis in her son and son; Ovarian cancer in her cousin; Parkinson's disease in her father.    ROS:  Please see the history of present illness.  Otherwise, review of systems is positive for poor appetite, hearing loss, visual disturbance, abdominal pain, depression, excessive sweating, fatigue, nausea, anxiety.  All other systems are reviewed and negative.    PHYSICAL EXAM: VS:  BP 120/70   Pulse 70   Ht 5\' 6"  (1.676 m)   SpO2 97%   BMI 26.47 kg/m  , BMI Body mass index is 26.47 kg/m. GEN: Well nourished, well developed, in no acute distress  HEENT: normal  Neck: no  JVD, no masses. No carotid bruits Cardiac: RRR without murmur or gallop     Respiratory:  clear to auscultation bilaterally, normal work of breathing GI: soft, nontender, nondistended, + BS MS: no deformity or atrophy  Ext: no pretibial edema, pedal pulses 2+= bilaterally Skin: warm and dry, no rash Neuro:  Strength and sensation are intact Psych: euthymic mood, full affect  EKG:  EKG is not ordered today. The ekg rom 05-29-2017 shows NSR with RBBB, no significant change from previous tracing.   Recent Labs: 03/03/2017: ALT 16; BUN 6; Creat 0.74; Hemoglobin 13.6; Platelets 356; Potassium 4.1; Sodium 139   Lipid Panel     Component Value Date/Time   CHOL 218 (H) 07/17/2016 1513   TRIG 115.0 07/17/2016 1513   HDL 49.30 07/17/2016 1513   CHOLHDL 4 07/17/2016 1513   VLDL 23.0 07/17/2016 1513   LDLCALC 146 (H) 07/17/2016 1513   LDLDIRECT 165.0 06/30/2011 1153      Wt Readings from Last 3 Encounters:  06/22/17 164 lb (74.4 kg)  05/29/17 161 lb (73 kg)  05/26/17 161 lb (73 kg)     Cardiac Studies Reviewed: Echo 08-17-2017: Left ventricle:  The cavity size was normal. There was mild focal basal hypertrophy of the septum. Systolic function was normal. Wall motion was normal; there were no regional wall  motion abnormalities. The transmitral flow pattern was normal. The deceleration time of the early transmitral flow velocity was normal. The pulmonary vein flow pattern was normal. The tissue Doppler parameters were normal. Left ventricular diastolic function parameters were normal.  ------------------------------------------------------------------- Aortic valve:   Trileaflet; normal thickness leaflets. Mobility was not restricted.  Doppler:  Transvalvular velocity was within the normal range. There was no stenosis. There was no regurgitation.   ------------------------------------------------------------------- Aorta:  Aortic root: The aortic root was normal in size.  ------------------------------------------------------------------- Mitral valve:   Structurally normal valve.   Mobility was not restricted.  Doppler:  Transvalvular velocity was within the normal range. There was no evidence for stenosis. There was trivial regurgitation.    Peak gradient (D): 3 mm Hg.  ------------------------------------------------------------------- Left atrium:  The atrium was normal in size.  ------------------------------------------------------------------- Atrial septum:  No defect or patent foramen ovale was identified by color flow Doppler.  ------------------------------------------------------------------- Right ventricle:  The cavity size was normal. Wall thickness was normal. Systolic function was normal.  ------------------------------------------------------------------- Pulmonic valve:    Doppler:  Transvalvular velocity was within the normal range. There was no evidence for stenosis.  ------------------------------------------------------------------- Tricuspid valve:   Structurally normal valve.    Doppler: Transvalvular velocity was within the normal range. There was mild regurgitation.  ------------------------------------------------------------------- Pulmonary  artery:   The main pulmonary artery was normal-sized. Systolic pressure was within the normal range.  ------------------------------------------------------------------- Right atrium:  The atrium was normal in size.  ------------------------------------------------------------------- Pericardium:  There was no pericardial effusion.   ASSESSMENT AND PLAN: 1.  Hypertension: She is having trouble tolerating carvedilol.  We will have her stop this and try hydrochlorothiazide 12.5 mg daily.  Continue long-acting diltiazem.  Check a metabolic panel in 2 weeks.  2.  Tricuspid regurgitation: Patient appears asymptomatic.  I reviewed her echo study which demonstrates only mild tricuspid regurgitation, reviewed reduced from moderate on her previous echo.  I do not appreciate a murmur on examination.  I think she can be followed clinically from here forward.  Current medicines are reviewed with the patient today.  The patient does not have concerns regarding medicines.  Labs/ tests ordered today  include:   Orders Placed This Encounter  Procedures  . Basic metabolic panel    Disposition:   FU one year  Signed, Sherren Mocha, MD  09/02/2017 10:50 PM    [facility] [address], [place], [place]  [address] Phone: [phone]; Fax: [phone]

## 2017-09-02 ENCOUNTER — Encounter: Payer: Self-pay | Admitting: Cardiovascular Disease

## 2017-09-03 ENCOUNTER — Other Ambulatory Visit: Payer: Self-pay | Admitting: Physician Assistant

## 2017-09-03 NOTE — Telephone Encounter (Signed)
Copied from [place] [id number]. Topic: Quick Communication - Rx Refill/Question >> Sep 03, 2017  1:22 PM Cleaster Corin, [place] wrote: Medication: xanax   Has the patient contacted their pharmacy? no   (Agent: If no, request that the patient contact the pharmacy for the refill.)   Preferred Pharmacy (with phone number or street name): [facility] [phone], [address] [place] [address] Phone: [phone] Fax: [phone]     Agent: Please be advised that RX refills may take up to 3 business days. We ask that you follow-up with your pharmacy.

## 2017-09-03 NOTE — Telephone Encounter (Signed)
Controlled substance 

## 2017-09-04 ENCOUNTER — Other Ambulatory Visit: Payer: Self-pay | Admitting: Physician Assistant

## 2017-09-04 NOTE — Telephone Encounter (Signed)
Last refill of Xanax on 08/05/17 #30 CSC: 07/25/16  UDS: 06/17/17 positive for benzos Last OV: 06/22/17 Acute visit  Please advise

## 2017-09-04 NOTE — Addendum Note (Signed)
Addended by: Leonidas Romberg on: 09/04/2017 09:29 AM   Modules accepted: Orders

## 2017-09-07 MED ORDER — ALPRAZOLAM 1 MG PO TABS: 1.0000 mg | ORAL_TABLET | Freq: Two times a day (BID) | ORAL | 0 refills | Status: DC | PRN

## 2017-09-07 NOTE — Telephone Encounter (Signed)
Rx sent in electronically.  Please call patient -- she needs updated CSC with me.

## 2017-09-07 NOTE — Addendum Note (Signed)
Addended by: Raiford Noble C on: 09/07/2017 08:00 AM   Modules accepted: Orders

## 2017-09-09 ENCOUNTER — Encounter: Payer: Self-pay | Admitting: Cardiovascular Disease

## 2017-09-09 MED ORDER — CARVEDILOL 12.5 MG PO TABS: 12.5000 mg | ORAL_TABLET | Freq: Every day | ORAL | 11 refills | Status: DC

## 2017-09-09 NOTE — Telephone Encounter (Signed)
Instructed patient to STOP HCTZ and RESTART COREG. She took Coreg 12.5 mg daily. She will call if symptoms persist or do not improve. She will continue to monitor BP. She was grateful for call and agrees with treatment plan.

## 2017-09-14 ENCOUNTER — Other Ambulatory Visit: Payer: Self-pay

## 2017-09-16 ENCOUNTER — Other Ambulatory Visit: Payer: Self-pay | Admitting: Physician Assistant

## 2017-09-16 DIAGNOSIS — R1013 Epigastric pain: Secondary | ICD-10-CM | POA: Diagnosis not present

## 2017-09-16 DIAGNOSIS — K219 Gastro-esophageal reflux disease without esophagitis: Secondary | ICD-10-CM | POA: Diagnosis not present

## 2017-09-16 DIAGNOSIS — K5904 Chronic idiopathic constipation: Secondary | ICD-10-CM | POA: Diagnosis not present

## 2017-09-16 DIAGNOSIS — M6289 Other specified disorders of muscle: Secondary | ICD-10-CM | POA: Diagnosis not present

## 2017-09-16 DIAGNOSIS — R194 Change in bowel habit: Secondary | ICD-10-CM | POA: Diagnosis not present

## 2017-09-16 DIAGNOSIS — D3A8 Other benign neuroendocrine tumors: Secondary | ICD-10-CM | POA: Diagnosis not present

## 2017-09-16 DIAGNOSIS — Z800 Family history of malignant neoplasm of digestive organs: Secondary | ICD-10-CM | POA: Diagnosis not present

## 2017-09-16 DIAGNOSIS — R140 Abdominal distension (gaseous): Secondary | ICD-10-CM | POA: Diagnosis not present

## 2017-09-16 DIAGNOSIS — K581 Irritable bowel syndrome with constipation: Secondary | ICD-10-CM | POA: Diagnosis not present

## 2017-09-16 DIAGNOSIS — Z86010 Personal history of colonic polyps: Secondary | ICD-10-CM | POA: Diagnosis not present

## 2017-09-18 ENCOUNTER — Encounter: Payer: Self-pay | Admitting: Cardiovascular Disease

## 2017-09-23 ENCOUNTER — Other Ambulatory Visit: Payer: Self-pay | Admitting: Cardiovascular Disease

## 2017-09-24 ENCOUNTER — Encounter: Payer: Self-pay | Admitting: Physician Assistant

## 2017-09-24 ENCOUNTER — Other Ambulatory Visit: Payer: Self-pay | Admitting: Physician Assistant

## 2017-09-24 DIAGNOSIS — J45909 Unspecified asthma, uncomplicated: Secondary | ICD-10-CM | POA: Diagnosis not present

## 2017-09-25 MED FILL — OSCIMIN SR 0.375 MG TABLET: 0.375 | 15 days supply | Qty: 30 | Fill #0

## 2017-09-27 ENCOUNTER — Other Ambulatory Visit: Payer: Self-pay | Admitting: Physician Assistant

## 2017-09-27 DIAGNOSIS — R1013 Epigastric pain: Principal | ICD-10-CM

## 2017-09-27 DIAGNOSIS — G8929 Other chronic pain: Secondary | ICD-10-CM

## 2017-09-29 ENCOUNTER — Ambulatory Visit (INDEPENDENT_AMBULATORY_CARE_PROVIDER_SITE_OTHER): Payer: Medicare HMO | Admitting: Physician Assistant

## 2017-09-29 ENCOUNTER — Encounter: Payer: Self-pay | Admitting: Physician Assistant

## 2017-09-29 ENCOUNTER — Other Ambulatory Visit: Payer: Self-pay

## 2017-09-29 VITALS — BP 130/70 | HR 70 | Temp 98.1°F | Resp 16 | Ht 66.0 in | Wt 163.0 lb

## 2017-09-29 DIAGNOSIS — R1013 Epigastric pain: Secondary | ICD-10-CM

## 2017-09-29 DIAGNOSIS — D3A00 Benign carcinoid tumor of unspecified site: Secondary | ICD-10-CM | POA: Diagnosis not present

## 2017-09-29 DIAGNOSIS — K862 Cyst of pancreas: Secondary | ICD-10-CM

## 2017-09-29 DIAGNOSIS — K22719 Barrett's esophagus with dysplasia, unspecified: Secondary | ICD-10-CM | POA: Diagnosis not present

## 2017-09-29 DIAGNOSIS — R82998 Other abnormal findings in urine: Secondary | ICD-10-CM | POA: Diagnosis not present

## 2017-09-29 DIAGNOSIS — K589 Irritable bowel syndrome without diarrhea: Secondary | ICD-10-CM

## 2017-09-29 LAB — COMPREHENSIVE METABOLIC PANEL
ALBUMIN: 4.4 g/dL (ref 3.5–5.2)
ALK PHOS: 101 U/L (ref 39–117)
ALT: 19 U/L (ref 0–35)
AST: 21 U/L (ref 0–37)
BUN: 8 mg/dL (ref 6–23)
CALCIUM: 9.2 mg/dL (ref 8.4–10.5)
CHLORIDE: 104 meq/L (ref 96–112)
CO2: 31 mEq/L (ref 19–32)
Creatinine, Ser: 0.66 mg/dL (ref 0.40–1.20)
GFR: 94.89 mL/min (ref >60.00)
Glucose, Bld: 92 mg/dL (ref 70–99)
POTASSIUM: 4.2 meq/L (ref 3.5–5.1)
SODIUM: 141 meq/L (ref 135–145)
Total Bilirubin: 0.4 mg/dL (ref 0.2–1.2)
Total Protein: 6.9 g/dL (ref 6.0–8.3)

## 2017-09-29 LAB — POCT URINALYSIS DIPSTICK
Bilirubin, UA: NEGATIVE
Blood, UA: NEGATIVE
Glucose, UA: NEGATIVE
Ketones, UA: NEGATIVE
NITRITE UA: NEGATIVE
PH UA: 7 (ref 5.0–8.0)
PROTEIN UA: NEGATIVE
Urobilinogen, UA: 0.2 E.U./dL

## 2017-09-29 LAB — CBC WITH DIFFERENTIAL/PLATELET
BASOS ABS: 0 10*3/uL (ref 0.0–0.1)
Basophils Relative: 0.7 % (ref 0.0–3.0)
Eosinophils Absolute: 0.1 10*3/uL (ref 0.0–0.7)
Eosinophils Relative: 1.9 % (ref 0.0–5.0)
HCT: 42.1 % (ref 36.0–46.0)
Hemoglobin: 14.1 g/dL (ref 12.0–15.0)
LYMPHS ABS: 2.3 10*3/uL (ref 0.7–4.0)
Lymphocytes Relative: 37.2 % (ref 12.0–46.0)
MCHC: 33.5 g/dL (ref 30.0–36.0)
MCV: 93.6 fl (ref 78.0–100.0)
MONO ABS: 0.6 10*3/uL (ref 0.1–1.0)
MONOS PCT: 9.7 % (ref 3.0–12.0)
NEUTROS PCT: 50.5 % (ref 43.0–77.0)
Neutro Abs: 3.1 10*3/uL (ref 1.4–7.7)
Platelets: 301 10*3/uL (ref 150.0–400.0)
RBC: 4.5 Mil/uL (ref 3.87–5.11)
RDW: 12.8 % (ref 11.5–15.5)
WBC: 6 10*3/uL (ref 4.0–10.5)

## 2017-09-29 LAB — LIPASE: LIPASE: 9 U/L — AB (ref 11.0–59.0)

## 2017-09-29 NOTE — Progress Notes (Signed)
Patient with history of GERD, Barrett's Esophagus, Carcinoid Tumor of Esophagus and chronic epigastric pain presents to clinic today c/o continued epigastric pain with request to see a new specialist. Is currently on a regimen of zofran, dexilant and ranitidine. . Is taking as directed. Has been out of her hyoscyamine for a few weeks with worsening of abdomina cramping and discomfort. Is trying to watch her diet. States she has spoken with her Current GI provider recently who recommended repeat EGD and colonoscopy. States this is all they ever do and never look into other avenues or treatments for her symptoms. Is concerned something more serious is going on and would like a second opinion. Notes fatigue with chronic nausea. Denies vomiting. Denies change to bowel or bladder habits. Has had multiple lab workups here and with her GI provider.    Past Medical History:  Diagnosis Date  . 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 Allergic state 12/31/2012  . Anxiety   . Asthma   . Barrett esophagus   . Blind loop syndrome   . C. difficile diarrhea   . Cancer ([place])   . Chest pain, atypical 07/14/2011  . Cold sore 07/17/2016  . Colon polyp   . Contact dermatitis 03/23/2012  . Cystocele, midline   . Depression   . Diarrhea   . Diverticulosis   . Endometrial hyperplasia 02/27/2006   BENIGN ENDO BX ON 02/2007  . GERD (gastroesophageal reflux disease)   . Headache 04/16/2015  . Headache(784.0) 04/15/2012  . Hematuria 04/27/2012  . Hepatic cyst   . Hiatal hernia   . Hiatal hernia   . Hyperlipidemia, mixed 12/17/2014  . Hypertension   . IBS (irritable bowel syndrome)   . Insomnia   . Leaky heart valve   . Low back pain 08/15/2013  . Osteoporosis 03/2017   T score -2.5. Without significant loss from prior studies  . 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 Paronychia of great toe, left 06/19/2013  . Preventative health care 09/11/2014  . Rectal bleeding 10/10/2011  . Rectocele   .  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 Sinusitis acute 02/03/2012  . Thrush 07/21/2011  . 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 Unspecified hypothyroidism   . Unspecified menopausal and postmenopausal disorder   . Uterine prolapse without mention of vaginal wall prolapse   . Vitamin B12 deficiency   . Vitamin D deficiency 07/17/2016    Current Outpatient Medications on File Prior to Visit  Medication Sig Dispense Refill  . albuterol (PROVENTIL HFA;VENTOLIN HFA) 108 (90 Base) MCG/ACT inhaler Inhale 2 puffs into the lungs every 6 (six) hours as needed for wheezing or shortness of breath. 1 Inhaler 0  . albuterol (PROVENTIL) (2.5 MG/3ML) 0.083% nebulizer solution Take 3 mLs (2.5 mg total) by nebulization every 6 (six) hours as needed for wheezing or shortness of breath. 150 mL 3  . ALPRAZolam (XANAX) 1 MG tablet Take 1 tablet (1 mg total) by mouth 2 (two) times daily as needed for anxiety. 30 tablet 0  . aspirin EC 81 MG tablet Take 1 tablet (81 mg total) by mouth daily.    Marland Kitchen azelastine (ASTELIN) 0.1 % nasal spray Place 1 spray into both nostrils 2 (two) times daily as needed for rhinitis. Use in each nostril as directed    . CARTIA XT 180 MG 24 hr capsule TAKE 1 CAPSULE (180 MG TOTAL) BY MOUTH DAILY. 90 capsule 3  . carvedilol (COREG) 12.5 MG tablet Take 1 tablet (12.5  mg total) by mouth daily. 90 tablet 3  . Cholecalciferol (VITAMIN D-3) 5000 units TABS Take 1 tablet by mouth daily. 30 tablet 0  . fluticasone (FLONASE) 50 MCG/ACT nasal spray Place 2 sprays into both nostrils daily.    . ondansetron (ZOFRAN) 4 MG tablet TAKE 1 TABLET BY MOUTH EVERY 8 HOURS AS NEEDED FOR NAUSEA AND VOMITING 20 tablet 3  . OSCIMIN SR 0.375 MG 12 hr tablet TAKE 1 TABLET (0.375 MG TOTAL) BY MOUTH 2 (TWO) TIMES DAILY. 30 tablet 0  . promethazine (PHENERGAN) 25 MG tablet TAKE 1 TABLET(25 MG) BY MOUTH THREE TIMES DAILY AS NEEDED FOR NAUSEA OR VOMITING 30 tablet 0  . ranitidine (ZANTAC) 300 MG tablet TAKE 1 TABLET AT  BEDTIME 90 tablet 1  . sucralfate (CARAFATE) 1 g tablet Take 1 g by mouth 4 (four) times daily.    Marland Kitchen tiZANidine (ZANAFLEX) 2 MG tablet TAKE 1/2 TO 1 TABLET BY MOUTH EVERY 6 HOURS AS NEEDED FOR MUSCLE SPASMS 30 tablet 0  . traZODone (DESYREL) 50 MG tablet TAKE 1/2 TO 1 TABLET AT BEDTIME AS NEEDED FOR  SLEEP 90 tablet 1   No current facility-administered medications on file prior to visit.     Allergies  Allergen Reactions  . Iodine-131 Other (See Comments)    Other reaction(s): Dizziness (intolerance)  . Sulfamethoxazole Shortness Of Breath    chest tightness  . Amitriptyline Anxiety and Other (See Comments)    Depression, insomnia  . Effexor [Venlafaxine]     palpitations  . Iohexol Other (See Comments)    Passed out  . Lactose Intolerance (Gi) Diarrhea and Nausea And Vomiting  . Lexapro [Escitalopram Oxalate]     nausea  . Iodinated Diagnostic Agents     Pt states at Dr.Grapeys office 15 years ago blacked out for 2 hours during injection for IVP. Pt says she had dye recently with no problems     Family History  Problem Relation Age of Onset  . Colon cancer Mother   . Diabetes Mother   . Hypertension Mother   . Colon polyps Father   . Parkinson's disease Father   . Colon cancer Maternal Grandmother   . Breast cancer Maternal Grandmother 63  . Diabetes Paternal Grandmother   . Breast cancer Paternal Grandmother 95  . Allergies Sister   . Arthritis Brother        b/l hip replace  . Alcohol abuse Daughter   . Arthritis Son   . Hypertension Son   . Allergies Son   . Osteoporosis Son   . Osteoporosis Son   . Arthritis Son        back disease, DDD  . Allergies Son   . Arthritis Son   . Irritable bowel syndrome Son        RSD  . Hypertension Paternal Grandfather   . Heart disease Paternal Grandfather   . Aneurysm Paternal Grandfather   . Colon cancer Maternal Aunt   . Colon cancer Cousin        X3  . Ovarian cancer Cousin     Social History   Socioeconomic  History  . Marital status: Married    Spouse name: None  . Number of children: 4  . Years of education: None  . Highest education level: None  Social Needs  . Financial resource strain: None  . Food insecurity - worry: None  . Food insecurity - inability: None  . Transportation needs - medical: None  . Transportation needs -  non-medical: None  Occupational History  . Occupation: Retired  Tobacco Use  . Smoking status: Former Smoker    Last attempt to quit: 08/19/1995    Years since quitting: 22.1  . Smokeless tobacco: Never Used  Substance and Sexual Activity  . Alcohol use: No    Alcohol/week: 0.0 oz  . Drug use: No  . Sexual activity: Not Currently    Birth control/protection: Post-menopausal    Comment: 1st intercourse 3 yo-5 partners  Other Topics Concern  . None  Social History Narrative  . None   Review of Systems - See HPI.  All other ROS are negative.  BP 130/70   Pulse 70   Temp 98.1 F (36.7 C) (Oral)   Resp 16   Ht 5\' 6"  (1.676 m)   Wt 163 lb (73.9 kg)   SpO2 99%   BMI 26.31 kg/m   Physical Exam  Constitutional: She is oriented to person, place, and time and well-developed, well-nourished, and in no distress.  HENT:  Head: Normocephalic and atraumatic.  Eyes: Conjunctivae are normal.  Neck: Neck supple.  Cardiovascular: Normal rate, regular rhythm, normal heart sounds and intact distal pulses.  Pulmonary/Chest: Effort normal and breath sounds normal. No respiratory distress. She has no wheezes. She has no rales. She exhibits no tenderness.  Abdominal: Soft. Bowel sounds are normal. She exhibits no distension. There is no hepatosplenomegaly. There is generalized tenderness. There is no rebound and no CVA tenderness.  Neurological: She is alert and oriented to person, place, and time.  Skin: Skin is warm and dry. No rash noted.  Psychiatric: Affect normal.  Vitals reviewed.  Assessment/Plan: 1. History of Barrett's esophagus with dysplasia 2.  Irritable bowel syndrome, unspecified type 3. Pancreatic cyst 4. History of Carcinoid (except of appendix) Patient is followed by Gastroenterology at [facility] but wants to see a different specialist. Will send to teaching facility like [facility] for assessment. Will repeat CBC today.  - Ambulatory referral to Gastroenterology - CBC with Differential/Platelet  5. Epigastric pain Long-standing. Urine dip with trace LE. No other abnormalities. Repeat CBC today. Will alter her PPI and restart hyoscyamine for pain. Carafate PRN until she can be seen by GI. Discussed imaging. She will consider CT.  - POCT urinalysis dipstick  6. Leukocytes in urine Urine culture sent to assess further.  - Urine Culture   Leeanne Rio, PA-C

## 2017-09-29 NOTE — Patient Instructions (Signed)
Please go to the lab today for blood work.  I will call you with your results. We will alter treatment regimen(s) if indicated by your results.   I am sending urine for further testing.    Please continue the reflux medication as directed. I will give a refill of the Carafate. Continue the Oscimin with a probiotic. I am setting you up ASAP with a new specialist for further assessment.

## 2017-09-30 ENCOUNTER — Encounter: Payer: Self-pay | Admitting: Physician Assistant

## 2017-09-30 LAB — URINE CULTURE
MICRO NUMBER: 90187376
SPECIMEN QUALITY:: ADEQUATE

## 2017-10-01 ENCOUNTER — Other Ambulatory Visit: Payer: Self-pay | Admitting: Physician Assistant

## 2017-10-01 DIAGNOSIS — H11423 Conjunctival edema, bilateral: Secondary | ICD-10-CM | POA: Diagnosis not present

## 2017-10-01 DIAGNOSIS — H40033 Anatomical narrow angle, bilateral: Secondary | ICD-10-CM | POA: Diagnosis not present

## 2017-10-01 DIAGNOSIS — R140 Abdominal distension (gaseous): Secondary | ICD-10-CM

## 2017-10-01 DIAGNOSIS — R109 Unspecified abdominal pain: Principal | ICD-10-CM

## 2017-10-01 DIAGNOSIS — G8929 Other chronic pain: Secondary | ICD-10-CM

## 2017-10-01 DIAGNOSIS — H18413 Arcus senilis, bilateral: Secondary | ICD-10-CM | POA: Diagnosis not present

## 2017-10-01 DIAGNOSIS — H5203 Hypermetropia, bilateral: Secondary | ICD-10-CM | POA: Diagnosis not present

## 2017-10-01 DIAGNOSIS — H2513 Age-related nuclear cataract, bilateral: Secondary | ICD-10-CM | POA: Diagnosis not present

## 2017-10-01 DIAGNOSIS — H11153 Pinguecula, bilateral: Secondary | ICD-10-CM | POA: Diagnosis not present

## 2017-10-01 DIAGNOSIS — H04123 Dry eye syndrome of bilateral lacrimal glands: Secondary | ICD-10-CM | POA: Diagnosis not present

## 2017-10-01 DIAGNOSIS — H40023 Open angle with borderline findings, high risk, bilateral: Secondary | ICD-10-CM | POA: Diagnosis not present

## 2017-10-01 DIAGNOSIS — H25013 Cortical age-related cataract, bilateral: Secondary | ICD-10-CM | POA: Diagnosis not present

## 2017-10-04 ENCOUNTER — Encounter: Payer: Self-pay | Admitting: Physician Assistant

## 2017-10-06 ENCOUNTER — Encounter: Payer: Self-pay | Admitting: Physician Assistant

## 2017-10-07 MED ORDER — PREDNISONE 50 MG PO TABS: ORAL_TABLET | ORAL | 0 refills | Status: DC

## 2017-10-08 ENCOUNTER — Other Ambulatory Visit: Payer: Self-pay | Admitting: Physician Assistant

## 2017-10-09 NOTE — Telephone Encounter (Signed)
Xanax refill request Last rx on 09/07/17 #30 CSC: 07/25/16 [place] UDS: 06/17/17

## 2017-10-09 NOTE — Telephone Encounter (Signed)
Please advise in PCP absence.  

## 2017-10-12 ENCOUNTER — Encounter (HOSPITAL_BASED_OUTPATIENT_CLINIC_OR_DEPARTMENT_OTHER): Payer: Self-pay

## 2017-10-12 ENCOUNTER — Ambulatory Visit (HOSPITAL_BASED_OUTPATIENT_CLINIC_OR_DEPARTMENT_OTHER)
Admission: RE | Admit: 2017-10-12 | Discharge: 2017-10-12 | Disposition: A | Discharge status: Home / Self Care | Payer: Medicare HMO | Source: Ambulatory Visit | Attending: Physician Assistant | Admitting: Physician Assistant

## 2017-10-12 DIAGNOSIS — R109 Unspecified abdominal pain: Secondary | ICD-10-CM | POA: Diagnosis not present

## 2017-10-12 DIAGNOSIS — R197 Diarrhea, unspecified: Secondary | ICD-10-CM | POA: Diagnosis not present

## 2017-10-12 DIAGNOSIS — R140 Abdominal distension (gaseous): Secondary | ICD-10-CM | POA: Diagnosis not present

## 2017-10-12 DIAGNOSIS — K5730 Diverticulosis of large intestine without perforation or abscess without bleeding: Secondary | ICD-10-CM | POA: Insufficient documentation

## 2017-10-12 DIAGNOSIS — I701 Atherosclerosis of renal artery: Secondary | ICD-10-CM | POA: Insufficient documentation

## 2017-10-12 DIAGNOSIS — I700 Atherosclerosis of aorta: Secondary | ICD-10-CM | POA: Insufficient documentation

## 2017-10-12 DIAGNOSIS — G8929 Other chronic pain: Secondary | ICD-10-CM

## 2017-10-12 DIAGNOSIS — M6289 Other specified disorders of muscle: Secondary | ICD-10-CM | POA: Insufficient documentation

## 2017-10-12 MED ORDER — IOPAMIDOL (ISOVUE-300) INJECTION 61%
100.0000 mL | Freq: Once | INTRAVENOUS | Status: AC | PRN
  Administered 2017-10-12: 100 mL via INTRAVENOUS

## 2017-10-14 ENCOUNTER — Encounter: Payer: Self-pay | Admitting: Physician Assistant

## 2017-10-16 ENCOUNTER — Ambulatory Visit: Payer: Medicare HMO | Admitting: Psychology

## 2017-10-22 DIAGNOSIS — J45909 Unspecified asthma, uncomplicated: Secondary | ICD-10-CM | POA: Diagnosis not present

## 2017-10-26 DIAGNOSIS — H40053 Ocular hypertension, bilateral: Secondary | ICD-10-CM | POA: Diagnosis not present

## 2017-10-26 DIAGNOSIS — H2513 Age-related nuclear cataract, bilateral: Secondary | ICD-10-CM | POA: Diagnosis not present

## 2017-10-26 DIAGNOSIS — H40033 Anatomical narrow angle, bilateral: Secondary | ICD-10-CM | POA: Diagnosis not present

## 2017-10-29 ENCOUNTER — Other Ambulatory Visit: Payer: Self-pay

## 2017-10-29 ENCOUNTER — Ambulatory Visit (INDEPENDENT_AMBULATORY_CARE_PROVIDER_SITE_OTHER): Payer: Medicare HMO | Admitting: Physician Assistant

## 2017-10-29 ENCOUNTER — Encounter: Payer: Self-pay | Admitting: Physician Assistant

## 2017-10-29 VITALS — BP 120/70 | HR 70 | Temp 98.3°F | Resp 16 | Ht 66.0 in | Wt 158.0 lb

## 2017-10-29 DIAGNOSIS — R519 Headache, unspecified: Secondary | ICD-10-CM

## 2017-10-29 DIAGNOSIS — R51 Headache: Secondary | ICD-10-CM | POA: Diagnosis not present

## 2017-10-29 DIAGNOSIS — J0130 Acute sphenoidal sinusitis, unspecified: Secondary | ICD-10-CM | POA: Diagnosis not present

## 2017-10-29 MED ORDER — KETOROLAC TROMETHAMINE 30 MG/ML IJ SOLN
30.0000 mg | Freq: Once | INTRAMUSCULAR | Status: AC
  Administered 2017-10-29: 30 mg via INTRAMUSCULAR

## 2017-10-29 MED ORDER — AMOXICILLIN-POT CLAVULANATE 875-125 MG PO TABS
1.0000 | ORAL_TABLET | Freq: Two times a day (BID) | ORAL | 0 refills | Status: DC
Start: 2017-10-29 — End: 2017-11-11

## 2017-10-29 NOTE — Patient Instructions (Signed)
Please take antibiotic as directed.  Increase fluid intake.  Use Saline nasal spray.  Take a daily multivitamin. Tylenol for headache. Start Flonase.  Place a humidifier in the bedroom.  Please call or return clinic if symptoms are not improving.  The shot given in office should help with headache from the sinuses.  If symptoms are not improving with treatment regimen, specifically the intensity of the headache, please go to ER for evaluation.  Sinusitis Sinusitis is redness, soreness, and swelling (inflammation) of the paranasal sinuses. Paranasal sinuses are air pockets within the bones of your face (beneath the eyes, the middle of the forehead, or above the eyes). In healthy paranasal sinuses, mucus is able to drain out, and air is able to circulate through them by way of your nose. However, when your paranasal sinuses are inflamed, mucus and air can become trapped. This can allow bacteria and other germs to grow and cause infection. Sinusitis can develop quickly and last only a short time (acute) or continue over a long period (chronic). Sinusitis that lasts for more than 12 weeks is considered chronic.  CAUSES  Causes of sinusitis include:  Allergies.  Structural abnormalities, such as displacement of the cartilage that separates your nostrils (deviated septum), which can decrease the air flow through your nose and sinuses and affect sinus drainage.  Functional abnormalities, such as when the small hairs (cilia) that line your sinuses and help remove mucus do not work properly or are not present. SYMPTOMS  Symptoms of acute and chronic sinusitis are the same. The primary symptoms are pain and pressure around the affected sinuses. Other symptoms include:  Upper toothache.  Earache.  Headache.  Bad breath.  Decreased sense of smell and taste.  A cough, which worsens when you are lying flat.  Fatigue.  Fever.  Thick drainage from your nose, which often is green and may contain  pus (purulent).  Swelling and warmth over the affected sinuses. DIAGNOSIS  Your caregiver will perform a physical exam. During the exam, your caregiver may:  Look in your nose for signs of abnormal growths in your nostrils (nasal polyps).  Tap over the affected sinus to check for signs of infection.  View the inside of your sinuses (endoscopy) with a special imaging device with a light attached (endoscope), which is inserted into your sinuses. If your caregiver suspects that you have chronic sinusitis, one or more of the following tests may be recommended:  Allergy tests.  Nasal culture A sample of mucus is taken from your nose and sent to a lab and screened for bacteria.  Nasal cytology A sample of mucus is taken from your nose and examined by your caregiver to determine if your sinusitis is related to an allergy. TREATMENT  Most cases of acute sinusitis are related to a viral infection and will resolve on their own within 10 days. Sometimes medicines are prescribed to help relieve symptoms (pain medicine, decongestants, nasal steroid sprays, or saline sprays).  However, for sinusitis related to a bacterial infection, your caregiver will prescribe antibiotic medicines. These are medicines that will help kill the bacteria causing the infection.  Rarely, sinusitis is caused by a fungal infection. In theses cases, your caregiver will prescribe antifungal medicine. For some cases of chronic sinusitis, surgery is needed. Generally, these are cases in which sinusitis recurs more than 3 times per year, despite other treatments. HOME CARE INSTRUCTIONS   Drink plenty of water. Water helps thin the mucus so your sinuses can drain more  easily.  Use a humidifier.  Inhale steam 3 to 4 times a day (for example, sit in the bathroom with the shower running).  Apply a warm, moist washcloth to your face 3 to 4 times a day, or as directed by your caregiver.  Use saline nasal sprays to help moisten and  clean your sinuses.  Take over-the-counter or prescription medicines for pain, discomfort, or fever only as directed by your caregiver. SEEK IMMEDIATE MEDICAL CARE IF:  You have increasing pain or severe headaches.  You have nausea, vomiting, or drowsiness.  You have swelling around your face.  You have vision problems.  You have a stiff neck.  You have difficulty breathing. MAKE SURE YOU:   Understand these instructions.  Will watch your condition.  Will get help right away if you are not doing well or get worse. Document Released: 08/04/2005 Document Revised: 10/27/2011 Document Reviewed: 08/19/2011 [facility] Patient Information 2014 [place], [place].

## 2017-10-29 NOTE — Progress Notes (Signed)
Patient presents to clinic today c/o 2 weeks of intermittent left sided headache, facial pain and tooth pain. Notes some nasal inflammation. Denies cough or PND like she usually does with a sinus issue. Denies vision changes, photophobia, phonophobia. Denies fever, chills. Denies numbness/tingling of face or sensory changes.   Past Medical History:  Diagnosis Date  . 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 Allergic state 12/31/2012  . Anxiety   . Asthma   . Barrett esophagus   . Blind loop syndrome   . C. difficile diarrhea   . Cancer ([place])   . Chest pain, atypical 07/14/2011  . Cold sore 07/17/2016  . Colon polyp   . Contact dermatitis 03/23/2012  . Cystocele, midline   . Depression   . Diarrhea   . Diverticulosis   . Endometrial hyperplasia 02/27/2006   BENIGN ENDO BX ON 02/2007  . GERD (gastroesophageal reflux disease)   . Headache 04/16/2015  . Headache(784.0) 04/15/2012  . Hematuria 04/27/2012  . Hepatic cyst   . Hiatal hernia   . Hiatal hernia   . Hyperlipidemia, mixed 12/17/2014  . Hypertension   . IBS (irritable bowel syndrome)   . Insomnia   . Leaky heart valve   . Low back pain 08/15/2013  . Osteoporosis 03/2017   T score -2.5. Without significant loss from prior studies  . 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 Paronychia of great toe, left 06/19/2013  . Preventative health care 09/11/2014  . Rectal bleeding 10/10/2011  . Rectocele   . 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 Sinusitis acute 02/03/2012  . Thrush 07/21/2011  . 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 Unspecified hypothyroidism   . Unspecified menopausal and postmenopausal disorder   . Uterine prolapse without mention of vaginal wall prolapse   . Vitamin B12 deficiency   . Vitamin D deficiency 07/17/2016    Current Outpatient Medications on File Prior to Visit  Medication Sig Dispense Refill  . albuterol (PROVENTIL HFA;VENTOLIN HFA) 108 (90 Base) MCG/ACT inhaler Inhale 2 puffs into the  lungs every 6 (six) hours as needed for wheezing or shortness of breath. 1 Inhaler 0  . albuterol (PROVENTIL) (2.5 MG/3ML) 0.083% nebulizer solution Take 3 mLs (2.5 mg total) by nebulization every 6 (six) hours as needed for wheezing or shortness of breath. 150 mL 3  . ALPRAZolam (XANAX) 1 MG tablet TAKE 1 TABLET BY MOUTH TWICE DAILY AS NEEDED FOR ANXIETY 30 tablet 0  . aspirin EC 81 MG tablet Take 1 tablet (81 mg total) by mouth daily.    Marland Kitchen azelastine (ASTELIN) 0.1 % nasal spray Place 1 spray into both nostrils 2 (two) times daily as needed for rhinitis. Use in each nostril as directed    . CARTIA XT 180 MG 24 hr capsule TAKE 1 CAPSULE (180 MG TOTAL) BY MOUTH DAILY. 90 capsule 3  . carvedilol (COREG) 12.5 MG tablet Take 1 tablet (12.5 mg total) by mouth daily. 90 tablet 3  . Cholecalciferol (VITAMIN D-3) 5000 units TABS Take 1 tablet by mouth daily. 30 tablet 0  . fluticasone (FLONASE) 50 MCG/ACT nasal spray Place 2 sprays into both nostrils daily.    . ondansetron (ZOFRAN) 4 MG tablet TAKE 1 TABLET BY MOUTH EVERY 8 HOURS AS NEEDED FOR NAUSEA AND VOMITING 20 tablet 3  . OSCIMIN SR 0.375 MG 12 hr tablet TAKE 1 TABLET (0.375 MG TOTAL) BY MOUTH 2 (TWO) TIMES DAILY. 30 tablet 0  . promethazine (PHENERGAN) 25 MG tablet TAKE  1 TABLET(25 MG) BY MOUTH THREE TIMES DAILY AS NEEDED FOR NAUSEA OR VOMITING 30 tablet 0  . ranitidine (ZANTAC) 300 MG tablet TAKE 1 TABLET AT BEDTIME 90 tablet 1  . sucralfate (CARAFATE) 1 g tablet Take 1 g by mouth 4 (four) times daily.    Marland Kitchen tiZANidine (ZANAFLEX) 2 MG tablet TAKE 1/2 TO 1 TABLET BY MOUTH EVERY 6 HOURS AS NEEDED FOR MUSCLE SPASMS 30 tablet 0  . traZODone (DESYREL) 50 MG tablet TAKE 1/2 TO 1 TABLET AT BEDTIME AS NEEDED FOR  SLEEP 90 tablet 1   No current facility-administered medications on file prior to visit.     Allergies  Allergen Reactions  . Iodine-131 Other (See Comments)    Other reaction(s): Dizziness (intolerance)  . Sulfamethoxazole Shortness Of  Breath    chest tightness  . Amitriptyline Anxiety and Other (See Comments)    Depression, insomnia  . Effexor [Venlafaxine]     palpitations  . Iohexol Other (See Comments)    Passed out  . Lactose Intolerance (Gi) Diarrhea and Nausea And Vomiting  . Lexapro [Escitalopram Oxalate]     nausea  . Iodinated Diagnostic Agents     Pt states at Dr.Grapeys office 15 years ago blacked out for 2 hours during injection for IVP. Pt says she had dye recently with no problems     Family History  Problem Relation Age of Onset  . Colon cancer Mother   . Diabetes Mother   . Hypertension Mother   . Colon polyps Father   . Parkinson's disease Father   . Colon cancer Maternal Grandmother   . Breast cancer Maternal Grandmother 45  . Diabetes Paternal Grandmother   . Breast cancer Paternal Grandmother 42  . Allergies Sister   . Arthritis Brother        b/l hip replace  . Alcohol abuse Daughter   . Arthritis Son   . Hypertension Son   . Allergies Son   . Osteoporosis Son   . Osteoporosis Son   . Arthritis Son        back disease, DDD  . Allergies Son   . Arthritis Son   . Irritable bowel syndrome Son        RSD  . Hypertension Paternal Grandfather   . Heart disease Paternal Grandfather   . Aneurysm Paternal Grandfather   . Colon cancer Maternal Aunt   . Colon cancer Cousin        X3  . Ovarian cancer Cousin     Social History   Socioeconomic History  . Marital status: Married    Spouse name: None  . Number of children: 4  . Years of education: None  . Highest education level: None  Social Needs  . Financial resource strain: None  . Food insecurity - worry: None  . Food insecurity - inability: None  . Transportation needs - medical: None  . Transportation needs - non-medical: None  Occupational History  . Occupation: Retired  Tobacco Use  . Smoking status: Former Smoker    Last attempt to quit: 08/19/1995    Years since quitting: 22.2  . Smokeless tobacco: Never Used    Substance and Sexual Activity  . Alcohol use: No    Alcohol/week: 0.0 oz  . Drug use: No  . Sexual activity: Not Currently    Birth control/protection: Post-menopausal    Comment: 1st intercourse 28 yo-5 partners  Other Topics Concern  . None  Social History Narrative  . None  Review of Systems - See HPI.  All other ROS are negative.  BP 120/70   Pulse 70   Temp 98.3 F (36.8 C) (Oral)   Resp 16   Ht 5\' 6"  (1.676 m)   Wt 158 lb (71.7 kg)   SpO2 99%   BMI 25.50 kg/m   Physical Exam  Constitutional: She is oriented to person, place, and time and well-developed, well-nourished, and in no distress.  HENT:  Head: Normocephalic and atraumatic.  Right Ear: Tympanic membrane and external ear normal.  Left Ear: External ear normal. A middle ear effusion (serous) is present.  Nose: Mucosal edema and rhinorrhea present. Left sinus exhibits maxillary sinus tenderness.  Mouth/Throat: Uvula is midline, oropharynx is clear and moist and mucous membranes are normal.  Eyes: Conjunctivae are normal.  Neck: Neck supple.  Cardiovascular: Normal rate, regular rhythm, normal heart sounds and intact distal pulses.  Pulmonary/Chest: Effort normal and breath sounds normal. No respiratory distress. She has no wheezes. She has no rales. She exhibits no tenderness.  Neurological: She is alert and oriented to person, place, and time.  Skin: Skin is warm and dry. No rash noted.  Psychiatric: Affect normal.    Recent Results (from the past 2160 hour(s))  Comprehensive metabolic panel     Status: None   Collection Time: 09/29/17 12:00 PM  Result Value Ref Range   Sodium 141 135 - 145 mEq/L   Potassium 4.2 3.5 - 5.1 mEq/L   Chloride 104 96 - 112 mEq/L   CO2 31 19 - 32 mEq/L   Glucose, Bld 92 70 - 99 mg/dL   BUN 8 6 - 23 mg/dL   Creatinine, Ser 0.66 0.40 - 1.20 mg/dL   Total Bilirubin 0.4 0.2 - 1.2 mg/dL   Alkaline Phosphatase 101 39 - 117 U/L   AST 21 0 - 37 U/L   ALT 19 0 - 35 U/L   Total  Protein 6.9 6.0 - 8.3 g/dL   Albumin 4.4 3.5 - 5.2 g/dL   Calcium 9.2 8.4 - 10.5 mg/dL   GFR 94.89 >60.00 mL/min  Lipase     Status: Abnormal   Collection Time: 09/29/17 12:00 PM  Result Value Ref Range   Lipase 9.0 (L) 11.0 - 59.0 U/L  CBC with Differential/Platelet     Status: None   Collection Time: 09/29/17 12:00 PM  Result Value Ref Range   WBC 6.0 4.0 - 10.5 K/uL   RBC 4.50 3.87 - 5.11 Mil/uL   Hemoglobin 14.1 12.0 - 15.0 g/dL   HCT 42.1 36.0 - 46.0 %   MCV 93.6 78.0 - 100.0 fl   MCHC 33.5 30.0 - 36.0 g/dL   RDW 12.8 11.5 - 15.5 %   Platelets 301.0 150.0 - 400.0 K/uL   Neutrophils Relative % 50.5 43.0 - 77.0 %   Lymphocytes Relative 37.2 12.0 - 46.0 %   Monocytes Relative 9.7 3.0 - 12.0 %   Eosinophils Relative 1.9 0.0 - 5.0 %   Basophils Relative 0.7 0.0 - 3.0 %   Neutro Abs 3.1 1.4 - 7.7 K/uL   Lymphs Abs 2.3 0.7 - 4.0 K/uL   Monocytes Absolute 0.6 0.1 - 1.0 K/uL   Eosinophils Absolute 0.1 0.0 - 0.7 K/uL   Basophils Absolute 0.0 0.0 - 0.1 K/uL  POCT urinalysis dipstick     Status: Abnormal   Collection Time: 09/29/17 12:09 PM  Result Value Ref Range   Color, UA dark yellow    Clarity, UA clear  Glucose, UA negative    Bilirubin, UA negative    Ketones, UA negative    Spec Grav, UA <=1.005 (A) 1.010 - 1.025   Blood, UA negative    pH, UA 7.0 5.0 - 8.0   Protein, UA negative    Urobilinogen, UA 0.2 0.2 or 1.0 E.U./dL   Nitrite, UA negative    Leukocytes, UA Small (1+) (A) Negative   Appearance     Odor    Urine Culture     Status: None   Collection Time: 09/29/17  1:14 PM  Result Value Ref Range   MICRO NUMBER: [id number]    SPECIMEN QUALITY: ADEQUATE    Sample Source URINE    STATUS: FINAL    Result:      Single organism less than 10,000 CFU/mL isolated. These organisms, commonly found on external and internal genitalia, are considered colonizers. No further testing performed.    Assessment/Plan: 1. Acute non-recurrent sphenoidal sinusitis Rx  Augmentin.  Increase fluids.  Rest.  Saline nasal spray.  Probiotic.  Mucinex as directed.  Humidifier in bedroom.  Call or return to clinic if symptoms are not improving.  - ketorolac (TORADOL) 30 MG/ML injection 30 mg - amoxicillin-clavulanate (AUGMENTIN) 875-125 MG tablet; Take 1 tablet by mouth 2 (two) times daily.  Dispense: 14 tablet; Refill: 0  2. Sinus headache IM Toradol given to abort headache giving significant headache. Tolerated well. OTC medications reivewed. - ketorolac (TORADOL) 30 MG/ML injection 30 mg - amoxicillin-clavulanate (AUGMENTIN) 875-125 MG tablet; Take 1 tablet by mouth 2 (two) times daily.  Dispense: 14 tablet; Refill: 0   Leeanne Rio, PA-C

## 2017-11-03 DIAGNOSIS — H40053 Ocular hypertension, bilateral: Secondary | ICD-10-CM | POA: Diagnosis not present

## 2017-11-03 DIAGNOSIS — H2513 Age-related nuclear cataract, bilateral: Secondary | ICD-10-CM | POA: Diagnosis not present

## 2017-11-03 DIAGNOSIS — H40033 Anatomical narrow angle, bilateral: Secondary | ICD-10-CM | POA: Diagnosis not present

## 2017-11-06 ENCOUNTER — Other Ambulatory Visit: Payer: Self-pay | Admitting: Family Medicine

## 2017-11-06 NOTE — Telephone Encounter (Signed)
RF request for alprazolam, Last RX 10/09/17 # 30 No refill Last OV 10/29/17 No upcoming appointment.

## 2017-11-06 NOTE — Telephone Encounter (Signed)
Last OV 08/31/17  Please Advise

## 2017-11-09 MED ORDER — ALPRAZOLAM 1 MG PO TABS: 1.0000 mg | ORAL_TABLET | Freq: Two times a day (BID) | ORAL | 0 refills | Status: DC | PRN

## 2017-11-09 NOTE — Addendum Note (Signed)
Addended by: Brunetta Jeans on: 11/09/2017 07:57 AM   Modules accepted: Orders

## 2017-11-09 NOTE — Telephone Encounter (Signed)
Refill sent.

## 2017-11-11 ENCOUNTER — Encounter: Payer: Self-pay | Admitting: Physician Assistant

## 2017-11-11 ENCOUNTER — Other Ambulatory Visit: Payer: Self-pay

## 2017-11-11 ENCOUNTER — Other Ambulatory Visit: Payer: Self-pay | Admitting: Physician Assistant

## 2017-11-11 ENCOUNTER — Ambulatory Visit (INDEPENDENT_AMBULATORY_CARE_PROVIDER_SITE_OTHER): Payer: Medicare HMO | Admitting: Physician Assistant

## 2017-11-11 VITALS — BP 130/70 | HR 68 | Temp 97.6°F | Resp 14 | Ht 66.0 in | Wt 159.0 lb

## 2017-11-11 DIAGNOSIS — J343 Hypertrophy of nasal turbinates: Secondary | ICD-10-CM

## 2017-11-11 DIAGNOSIS — J3489 Other specified disorders of nose and nasal sinuses: Secondary | ICD-10-CM | POA: Diagnosis not present

## 2017-11-11 NOTE — Progress Notes (Signed)
Patient presents to clinic today c/o recurrence of left nasal pressure and pain since last night. Was recently treated for a sinusitis with Augmentin and noted recurrence of symptoms. States that she feels like something is stuck in her nose. Denies epistaxis. Denies foreign body placement. Denies fever, chills, cough. Denies ear pressure or pain. Is trying to use Flonase and Astelin as directed.   Past Medical History:  Diagnosis Date  . 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 Allergic state 12/31/2012  . Anxiety   . Asthma   . Barrett esophagus   . Blind loop syndrome   . C. difficile diarrhea   . Cancer ([place])   . Chest pain, atypical 07/14/2011  . Cold sore 07/17/2016  . Colon polyp   . Contact dermatitis 03/23/2012  . Cystocele, midline   . Depression   . Diarrhea   . Diverticulosis   . Endometrial hyperplasia 02/27/2006   BENIGN ENDO BX ON 02/2007  . GERD (gastroesophageal reflux disease)   . Headache 04/16/2015  . Headache(784.0) 04/15/2012  . Hematuria 04/27/2012  . Hepatic cyst   . Hiatal hernia   . Hiatal hernia   . Hyperlipidemia, mixed 12/17/2014  . Hypertension   . IBS (irritable bowel syndrome)   . Insomnia   . Leaky heart valve   . Low back pain 08/15/2013  . Osteoporosis 03/2017   T score -2.5. Without significant loss from prior studies  . 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 Paronychia of great toe, left 06/19/2013  . Preventative health care 09/11/2014  . Rectal bleeding 10/10/2011  . Rectocele   . 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 Sinusitis acute 02/03/2012  . Thrush 07/21/2011  . 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 Unspecified hypothyroidism   . Unspecified menopausal and postmenopausal disorder   . Uterine prolapse without mention of vaginal wall prolapse   . Vitamin B12 deficiency   . Vitamin D deficiency 07/17/2016    Current Outpatient Medications on File Prior to Visit  Medication Sig Dispense Refill  . albuterol (PROVENTIL  HFA;VENTOLIN HFA) 108 (90 Base) MCG/ACT inhaler Inhale 2 puffs into the lungs every 6 (six) hours as needed for wheezing or shortness of breath. 1 Inhaler 0  . albuterol (PROVENTIL) (2.5 MG/3ML) 0.083% nebulizer solution Take 3 mLs (2.5 mg total) by nebulization every 6 (six) hours as needed for wheezing or shortness of breath. 150 mL 3  . ALPRAZolam (XANAX) 1 MG tablet Take 1 tablet (1 mg total) by mouth 2 (two) times daily as needed. for anxiety 30 tablet 0  . aspirin EC 81 MG tablet Take 1 tablet (81 mg total) by mouth daily.    Marland Kitchen azelastine (ASTELIN) 0.1 % nasal spray Place 1 spray into both nostrils 2 (two) times daily as needed for rhinitis. Use in each nostril as directed    . CARTIA XT 180 MG 24 hr capsule TAKE 1 CAPSULE (180 MG TOTAL) BY MOUTH DAILY. 90 capsule 3  . carvedilol (COREG) 12.5 MG tablet Take 1 tablet (12.5 mg total) by mouth daily. 90 tablet 3  . Cholecalciferol (VITAMIN D-3) 5000 units TABS Take 1 tablet by mouth daily. 30 tablet 0  . fluticasone (FLONASE) 50 MCG/ACT nasal spray Place 2 sprays into both nostrils daily.    . ondansetron (ZOFRAN) 4 MG tablet TAKE 1 TABLET BY MOUTH EVERY 8 HOURS AS NEEDED FOR NAUSEA AND VOMITING 20 tablet 3  . OSCIMIN SR 0.375 MG 12 hr tablet TAKE 1 TABLET (  0.375 MG TOTAL) BY MOUTH 2 (TWO) TIMES DAILY. 30 tablet 0  . promethazine (PHENERGAN) 25 MG tablet TAKE 1 TABLET(25 MG) BY MOUTH THREE TIMES DAILY AS NEEDED FOR NAUSEA OR VOMITING 30 tablet 0  . ranitidine (ZANTAC) 300 MG tablet TAKE 1 TABLET AT BEDTIME 90 tablet 1  . sucralfate (CARAFATE) 1 g tablet Take 1 g by mouth 4 (four) times daily.    Marland Kitchen tiZANidine (ZANAFLEX) 2 MG tablet TAKE 1/2 TO 1 TABLET BY MOUTH EVERY 6 HOURS AS NEEDED FOR MUSCLE SPASMS 30 tablet 0  . traZODone (DESYREL) 50 MG tablet TAKE 1/2 TO 1 TABLET AT BEDTIME AS NEEDED FOR  SLEEP 90 tablet 1   No current facility-administered medications on file prior to visit.     Allergies  Allergen Reactions  . Iodine-131 Other (See  Comments)    Other reaction(s): Dizziness (intolerance)  . Sulfamethoxazole Shortness Of Breath    chest tightness  . Amitriptyline Anxiety and Other (See Comments)    Depression, insomnia  . Effexor [Venlafaxine]     palpitations  . Iohexol Other (See Comments)    Passed out  . Lactose Intolerance (Gi) Diarrhea and Nausea And Vomiting  . Lexapro [Escitalopram Oxalate]     nausea  . Iodinated Diagnostic Agents     Pt states at Dr.Grapeys office 15 years ago blacked out for 2 hours during injection for IVP. Pt says she had dye recently with no problems     Family History  Problem Relation Age of Onset  . Colon cancer Mother   . Diabetes Mother   . Hypertension Mother   . Colon polyps Father   . Parkinson's disease Father   . Colon cancer Maternal Grandmother   . Breast cancer Maternal Grandmother 79  . Diabetes Paternal Grandmother   . Breast cancer Paternal Grandmother 37  . Allergies Sister   . Arthritis Brother        b/l hip replace  . Alcohol abuse Daughter   . Arthritis Son   . Hypertension Son   . Allergies Son   . Osteoporosis Son   . Osteoporosis Son   . Arthritis Son        back disease, DDD  . Allergies Son   . Arthritis Son   . Irritable bowel syndrome Son        RSD  . Hypertension Paternal Grandfather   . Heart disease Paternal Grandfather   . Aneurysm Paternal Grandfather   . Colon cancer Maternal Aunt   . Colon cancer Cousin        X3  . Ovarian cancer Cousin     Social History   Socioeconomic History  . Marital status: Married    Spouse name: Not on file  . Number of children: 4  . Years of education: Not on file  . Highest education level: Not on file  Occupational History  . Occupation: Retired  Scientific laboratory technician  . Financial resource strain: Not on file  . Food insecurity:    Worry: Not on file    Inability: Not on file  . Transportation needs:    Medical: Not on file    Non-medical: Not on file  Tobacco Use  . Smoking status:  Former Smoker    Last attempt to quit: 08/19/1995    Years since quitting: 22.2  . Smokeless tobacco: Never Used  Substance and Sexual Activity  . Alcohol use: No    Alcohol/week: 0.0 oz  . Drug use: No  Types: Hydrocodone  . Sexual activity: Not Currently    Birth control/protection: Post-menopausal    Comment: 1st intercourse 69 yo-5 partners  Lifestyle  . Physical activity:    Days per week: Not on file    Minutes per session: Not on file  . Stress: Not on file  Relationships  . Social connections:    Talks on phone: Not on file    Gets together: Not on file    Attends religious service: Not on file    Active member of club or organization: Not on file    Attends meetings of clubs or organizations: Not on file    Relationship status: Not on file  Other Topics Concern  . Not on file  Social History Narrative  . Not on file   Review of Systems - See HPI.  All other ROS are negative.  BP 130/70   Pulse 68   Temp 97.6 F (36.4 C) (Oral)   Resp 14   Ht 5\' 6"  (1.676 m)   Wt 159 lb (72.1 kg)   SpO2 99%   BMI 25.66 kg/m   Physical Exam  Constitutional: She is oriented to person, place, and time and well-developed, well-nourished, and in no distress.  HENT:  Head: Normocephalic and atraumatic.  Right Ear: Tympanic membrane normal.  Left Ear: Tympanic membrane normal.  Nose: Mucosal edema and rhinorrhea present. Right sinus exhibits no maxillary sinus tenderness and no frontal sinus tenderness. Left sinus exhibits no maxillary sinus tenderness and no frontal sinus tenderness.  Mouth/Throat: Uvula is midline, oropharynx is clear and moist and mucous membranes are normal.  Cannot visualize more posteriorly due to significant turbinate swelling.   Eyes: Conjunctivae are normal.  Neck: Neck supple.  Cardiovascular: Normal rate, regular rhythm, normal heart sounds and intact distal pulses.  Pulmonary/Chest: Effort normal and breath sounds normal. No respiratory distress.  She has no wheezes. She has no rales. She exhibits no tenderness.  Neurological: She is alert and oriented to person, place, and time.  Skin: Skin is warm and dry. No rash noted.  Psychiatric: Affect normal.  Vitals reviewed.  Recent Results (from the past 2160 hour(s))  Comprehensive metabolic panel     Status: None   Collection Time: 09/29/17 12:00 PM  Result Value Ref Range   Sodium 141 135 - 145 mEq/L   Potassium 4.2 3.5 - 5.1 mEq/L   Chloride 104 96 - 112 mEq/L   CO2 31 19 - 32 mEq/L   Glucose, Bld 92 70 - 99 mg/dL   BUN 8 6 - 23 mg/dL   Creatinine, Ser 0.66 0.40 - 1.20 mg/dL   Total Bilirubin 0.4 0.2 - 1.2 mg/dL   Alkaline Phosphatase 101 39 - 117 U/L   AST 21 0 - 37 U/L   ALT 19 0 - 35 U/L   Total Protein 6.9 6.0 - 8.3 g/dL   Albumin 4.4 3.5 - 5.2 g/dL   Calcium 9.2 8.4 - 10.5 mg/dL   GFR 94.89 >60.00 mL/min  Lipase     Status: Abnormal   Collection Time: 09/29/17 12:00 PM  Result Value Ref Range   Lipase 9.0 (L) 11.0 - 59.0 U/L  CBC with Differential/Platelet     Status: None   Collection Time: 09/29/17 12:00 PM  Result Value Ref Range   WBC 6.0 4.0 - 10.5 K/uL   RBC 4.50 3.87 - 5.11 Mil/uL   Hemoglobin 14.1 12.0 - 15.0 g/dL   HCT 42.1 36.0 - 46.0 %  MCV 93.6 78.0 - 100.0 fl   MCHC 33.5 30.0 - 36.0 g/dL   RDW 12.8 11.5 - 15.5 %   Platelets 301.0 150.0 - 400.0 K/uL   Neutrophils Relative % 50.5 43.0 - 77.0 %   Lymphocytes Relative 37.2 12.0 - 46.0 %   Monocytes Relative 9.7 3.0 - 12.0 %   Eosinophils Relative 1.9 0.0 - 5.0 %   Basophils Relative 0.7 0.0 - 3.0 %   Neutro Abs 3.1 1.4 - 7.7 K/uL   Lymphs Abs 2.3 0.7 - 4.0 K/uL   Monocytes Absolute 0.6 0.1 - 1.0 K/uL   Eosinophils Absolute 0.1 0.0 - 0.7 K/uL   Basophils Absolute 0.0 0.0 - 0.1 K/uL  POCT urinalysis dipstick     Status: Abnormal   Collection Time: 09/29/17 12:09 PM  Result Value Ref Range   Color, UA dark yellow    Clarity, UA clear    Glucose, UA negative    Bilirubin, UA negative     Ketones, UA negative    Spec Grav, UA <=1.005 (A) 1.010 - 1.025   Blood, UA negative    pH, UA 7.0 5.0 - 8.0   Protein, UA negative    Urobilinogen, UA 0.2 0.2 or 1.0 E.U./dL   Nitrite, UA negative    Leukocytes, UA Small (1+) (A) Negative   Appearance     Odor    Urine Culture     Status: None   Collection Time: 09/29/17  1:14 PM  Result Value Ref Range   MICRO NUMBER: [id number]    SPECIMEN QUALITY: ADEQUATE    Sample Source URINE    STATUS: FINAL    Result:      Single organism less than 10,000 CFU/mL isolated. These organisms, commonly found on external and internal genitalia, are considered colonizers. No further testing performed.    Assessment/Plan: 1. Obstruction of nasal valve 2. Nasal turbinate hypertrophy Significant swelling that is blocking a potential obstruction more posterior. Concern for nasal polyp. Patient is followed by ENT. Urgent appointment scheduled for Friday with Dr. Erik Obey. Continue Flonase and Astelin as directed along with antihistamine. OTC sinus medications discussed. Follow-up for any worsening or new symptoms.    Leeanne Rio, PA-C

## 2017-11-11 NOTE — Patient Instructions (Addendum)
Please keep well-hydrated. Continue Flonase and Astelin as directed along with an oral antihistamine.  Can try to use Afrin for a max of 2 days in a row instead of the Flonase/Astelin to help with nasal congestion. Do not use any longer than this.   Can use Tylenol if needed for pain with this.  Should improve with the Afrin until we can get you in with Dr. Erik Obey Friday.

## 2017-11-12 ENCOUNTER — Encounter: Payer: Self-pay | Admitting: Physician Assistant

## 2017-11-12 ENCOUNTER — Other Ambulatory Visit: Payer: Self-pay | Admitting: Physician Assistant

## 2017-11-12 MED ORDER — ONDANSETRON HCL 4 MG PO TABS: 4.0000 mg | ORAL_TABLET | Freq: Three times a day (TID) | ORAL | 0 refills | Status: DC | PRN

## 2017-11-12 MED ORDER — DOXYCYCLINE HYCLATE 100 MG PO CAPS: 100.0000 mg | ORAL_CAPSULE | Freq: Two times a day (BID) | ORAL | 0 refills | Status: DC

## 2017-11-13 ENCOUNTER — Encounter: Payer: Self-pay | Admitting: Physician Assistant

## 2017-11-18 DIAGNOSIS — H68002 Unspecified Eustachian salpingitis, left ear: Secondary | ICD-10-CM | POA: Diagnosis not present

## 2017-11-18 DIAGNOSIS — M26623 Arthralgia of bilateral temporomandibular joint: Secondary | ICD-10-CM | POA: Diagnosis not present

## 2017-11-18 DIAGNOSIS — R51 Headache: Secondary | ICD-10-CM | POA: Diagnosis not present

## 2017-11-18 DIAGNOSIS — F329 Major depressive disorder, single episode, unspecified: Secondary | ICD-10-CM | POA: Diagnosis not present

## 2017-11-18 DIAGNOSIS — H6980 Other specified disorders of Eustachian tube, unspecified ear: Secondary | ICD-10-CM | POA: Diagnosis not present

## 2017-11-22 DIAGNOSIS — J45909 Unspecified asthma, uncomplicated: Secondary | ICD-10-CM | POA: Diagnosis not present

## 2017-11-23 ENCOUNTER — Encounter: Payer: Self-pay | Admitting: Physician Assistant

## 2017-11-26 DIAGNOSIS — H2512 Age-related nuclear cataract, left eye: Secondary | ICD-10-CM | POA: Diagnosis not present

## 2017-12-02 DIAGNOSIS — R51 Headache: Secondary | ICD-10-CM | POA: Diagnosis not present

## 2017-12-02 DIAGNOSIS — H9113 Presbycusis, bilateral: Secondary | ICD-10-CM | POA: Diagnosis not present

## 2017-12-02 DIAGNOSIS — J310 Chronic rhinitis: Secondary | ICD-10-CM | POA: Diagnosis not present

## 2017-12-02 DIAGNOSIS — M26623 Arthralgia of bilateral temporomandibular joint: Secondary | ICD-10-CM | POA: Diagnosis not present

## 2017-12-02 DIAGNOSIS — H903 Sensorineural hearing loss, bilateral: Secondary | ICD-10-CM | POA: Diagnosis not present

## 2017-12-03 DIAGNOSIS — H16223 Keratoconjunctivitis sicca, not specified as Sjogren's, bilateral: Secondary | ICD-10-CM | POA: Diagnosis not present

## 2017-12-10 ENCOUNTER — Other Ambulatory Visit: Payer: Self-pay | Admitting: Physician Assistant

## 2017-12-12 ENCOUNTER — Other Ambulatory Visit: Payer: Self-pay | Admitting: Physician Assistant

## 2017-12-17 ENCOUNTER — Other Ambulatory Visit: Payer: Self-pay | Admitting: Physician Assistant

## 2017-12-17 NOTE — Telephone Encounter (Signed)
Last OV 11/11/17 for Obstruction of Nasal Valve, No future OV  Last filled 08/04/17, #30 with 0 refills  Also had Zofran filled on 11/12/17, #20 with 0 refills  Okay to refill Phenergan? Please advise.

## 2017-12-17 NOTE — Telephone Encounter (Signed)
Please see message. °

## 2017-12-22 DIAGNOSIS — J45909 Unspecified asthma, uncomplicated: Secondary | ICD-10-CM | POA: Diagnosis not present

## 2017-12-24 ENCOUNTER — Other Ambulatory Visit: Payer: Self-pay | Admitting: Physician Assistant

## 2017-12-24 MED FILL — OSCIMIN SR 0.375 MG TABLET: 0.375 | 15 days supply | Qty: 30 | Fill #0

## 2017-12-24 NOTE — Telephone Encounter (Signed)
Last OV 11/11/17 ( Obstruction of Nasal Valve), No future OV  Last filled 09/25/17, # 30 with 0 refills  Is this medication okay to refill? If so is the dosage correct? I see an email about a specialist and not sure if they are to prescribe or if we are.  Please advise.

## 2018-01-02 ENCOUNTER — Other Ambulatory Visit: Payer: Self-pay | Admitting: Physician Assistant

## 2018-01-02 DIAGNOSIS — G8929 Other chronic pain: Secondary | ICD-10-CM

## 2018-01-02 DIAGNOSIS — R1013 Epigastric pain: Principal | ICD-10-CM

## 2018-01-04 NOTE — Telephone Encounter (Signed)
Last OV 11/11/17 (Nasal Valve Obstruction), No future OV  Last filled by historical provider, note stating "discontinued due to completing course"  Just double checking that this should be discontinued?

## 2018-01-06 ENCOUNTER — Other Ambulatory Visit: Payer: Self-pay | Admitting: Physician Assistant

## 2018-01-12 ENCOUNTER — Other Ambulatory Visit: Payer: Self-pay | Admitting: Physician Assistant

## 2018-01-12 NOTE — Telephone Encounter (Signed)
Xanax last filled on 12/11/17 #30 Last OV: 11/11/17 acute CSC: 07/25/16 UDS: 06/17/17   Please advise

## 2018-01-13 ENCOUNTER — Encounter: Payer: Self-pay | Admitting: Emergency Medicine

## 2018-01-13 ENCOUNTER — Other Ambulatory Visit: Payer: Self-pay | Admitting: Physician Assistant

## 2018-01-13 DIAGNOSIS — Z79899 Other long term (current) drug therapy: Secondary | ICD-10-CM

## 2018-01-13 NOTE — Telephone Encounter (Signed)
[facility] advising patient is due for renewal of CSC and UDS. Rx for Xanax is ready at the front desk for pick up after she signes contract and give a UDS. UDS order placed in chart.

## 2018-01-14 ENCOUNTER — Other Ambulatory Visit: Payer: Medicare HMO

## 2018-01-14 DIAGNOSIS — Z79899 Other long term (current) drug therapy: Secondary | ICD-10-CM

## 2018-01-17 LAB — PAIN MGMT, PROFILE 8 W/CONF, U
6 ACETYLMORPHINE: NEGATIVE ng/mL (ref <10)
ALCOHOL METABOLITES: NEGATIVE ng/mL (ref <500)
ALPHAHYDROXYTRIAZOLAM: NEGATIVE ng/mL (ref <50)
Alphahydroxyalprazolam: 151 ng/mL — ABNORMAL HIGH (ref <25)
Alphahydroxymidazolam: NEGATIVE ng/mL (ref <50)
Aminoclonazepam: NEGATIVE ng/mL (ref <25)
Amphetamines: NEGATIVE ng/mL (ref <500)
Benzodiazepines: POSITIVE ng/mL — AB (ref <100)
Buprenorphine, Urine: NEGATIVE ng/mL (ref <5)
COCAINE METABOLITE: NEGATIVE ng/mL (ref <150)
Codeine: NEGATIVE ng/mL (ref <50)
Creatinine: 49.7 mg/dL
HYDROXYETHYLFLURAZEPAM: NEGATIVE ng/mL (ref <50)
Hydrocodone: NEGATIVE ng/mL (ref <50)
Hydromorphone: NEGATIVE ng/mL (ref <50)
LORAZEPAM: NEGATIVE ng/mL (ref <50)
MARIJUANA METABOLITE: NEGATIVE ng/mL (ref <20)
MDMA: NEGATIVE ng/mL (ref <500)
MORPHINE: NEGATIVE ng/mL (ref <50)
NORHYDROCODONE: NEGATIVE ng/mL (ref <50)
Nordiazepam: NEGATIVE ng/mL (ref <50)
OPIATES: NEGATIVE ng/mL (ref <100)
OXAZEPAM: NEGATIVE ng/mL (ref <50)
Oxidant: NEGATIVE ug/mL (ref <200)
Oxycodone: NEGATIVE ng/mL (ref <100)
Temazepam: NEGATIVE ng/mL (ref <50)
pH: 7.19 (ref 4.5–9.0)

## 2018-01-19 ENCOUNTER — Other Ambulatory Visit: Payer: Self-pay

## 2018-01-19 ENCOUNTER — Ambulatory Visit (INDEPENDENT_AMBULATORY_CARE_PROVIDER_SITE_OTHER): Payer: Medicare HMO | Admitting: Physician Assistant

## 2018-01-19 ENCOUNTER — Encounter: Payer: Self-pay | Admitting: Physician Assistant

## 2018-01-19 VITALS — BP 140/70 | HR 76 | Temp 98.0°F | Resp 14 | Ht 66.0 in | Wt 155.0 lb

## 2018-01-19 DIAGNOSIS — J029 Acute pharyngitis, unspecified: Secondary | ICD-10-CM | POA: Insufficient documentation

## 2018-01-19 LAB — POCT RAPID STREP A (OFFICE): RAPID STREP A SCREEN: NEGATIVE

## 2018-01-19 MED ORDER — MAGIC MOUTHWASH W/LIDOCAINE: 5.0000 mL | Freq: Three times a day (TID) | ORAL | 0 refills | Status: DC | PRN

## 2018-01-19 NOTE — Assessment & Plan Note (Signed)
Rapid strep negative. No evidence of significant PND on examination. No lesion of thrush noted. Question recent herpetic stomatitis giving appearance of lesions of roof of mouth. Too late to start anti-viral. Start magic mouthwash with lidocaine. Supportive measures reviewed. ENT if not improving.

## 2018-01-19 NOTE — Progress Notes (Signed)
Patient presents to clinic today c/o sore/irritated mouth and throat over the past week associated some sores noted on the roof of her mouth. Patient denies fevers but notes chills. Has chronic rhinitis but denies new or worsening symptoms. Denies recent steroid use. Denies known trauma or injury. Has not taken anything for symptoms. Denies recent travel or sick contact.   Past Medical History:  Diagnosis Date  . 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 Allergic state 12/31/2012  . Anxiety   . Asthma   . Barrett esophagus   . Blind loop syndrome   . C. difficile diarrhea   . Cancer ([place])   . Chest pain, atypical 07/14/2011  . Cold sore 07/17/2016  . Colon polyp   . Contact dermatitis 03/23/2012  . Cystocele, midline   . Depression   . Diarrhea   . Diverticulosis   . Endometrial hyperplasia 02/27/2006   BENIGN ENDO BX ON 02/2007  . GERD (gastroesophageal reflux disease)   . Headache 04/16/2015  . Headache(784.0) 04/15/2012  . Hematuria 04/27/2012  . Hepatic cyst   . Hiatal hernia   . Hiatal hernia   . Hyperlipidemia, mixed 12/17/2014  . Hypertension   . IBS (irritable bowel syndrome)   . Insomnia   . Leaky heart valve   . Low back pain 08/15/2013  . Osteoporosis 03/2017   T score -2.5. Without significant loss from prior studies  . 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 Paronychia of great toe, left 06/19/2013  . Preventative health care 09/11/2014  . Rectal bleeding 10/10/2011  . Rectocele   . 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 Sinusitis acute 02/03/2012  . Thrush 07/21/2011  . 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 Unspecified hypothyroidism   . Unspecified menopausal and postmenopausal disorder   . Uterine prolapse without mention of vaginal wall prolapse   . Vitamin B12 deficiency   . Vitamin D deficiency 07/17/2016    Current Outpatient Medications on File Prior to Visit  Medication Sig Dispense Refill  . albuterol (PROVENTIL HFA;VENTOLIN HFA) 108  (90 Base) MCG/ACT inhaler Inhale 2 puffs into the lungs every 6 (six) hours as needed for wheezing or shortness of breath. 1 Inhaler 0  . albuterol (PROVENTIL) (2.5 MG/3ML) 0.083% nebulizer solution Take 3 mLs (2.5 mg total) by nebulization every 6 (six) hours as needed for wheezing or shortness of breath. 150 mL 3  . ALPRAZolam (XANAX) 1 MG tablet TAKE 1 TABLET BY MOUTH TWICE DAILY AS NEEDED FOR ANXIETY 30 tablet 0  . aspirin EC 81 MG tablet Take 1 tablet (81 mg total) by mouth daily.    Marland Kitchen azelastine (ASTELIN) 0.1 % nasal spray Place 1 spray into both nostrils 2 (two) times daily as needed for rhinitis. Use in each nostril as directed    . CARTIA XT 180 MG 24 hr capsule TAKE 1 CAPSULE (180 MG TOTAL) BY MOUTH DAILY. 90 capsule 3  . carvedilol (COREG) 12.5 MG tablet Take 1 tablet (12.5 mg total) by mouth daily. 90 tablet 3  . Cholecalciferol (VITAMIN D-3) 5000 units TABS Take 1 tablet by mouth daily. 30 tablet 0  . FLUoxetine (PROZAC) 20 MG capsule Take 20 mg by mouth daily.    . fluticasone (FLONASE) 50 MCG/ACT nasal spray INSTILL 2 SPRAYS IN EACH NOSTRIL EVERY DAY 16 g 0  . ondansetron (ZOFRAN) 4 MG tablet Take 1 tablet (4 mg total) by mouth every 8 (eight) hours as needed for nausea or vomiting. 20 tablet  0  . OSCIMIN SR 0.375 MG 12 hr tablet TAKE 1 TABLET BY MOUTH 2 (TWO) TIMES DAILY. 30 tablet 0  . promethazine (PHENERGAN) 25 MG tablet TAKE 1 TABLET BY MOUTH THREE TIMES DAILY AS NEEDED FOR NAUSEA AND VOMITING 30 tablet 0  . ranitidine (ZANTAC) 300 MG tablet TAKE 1 TABLET AT BEDTIME 90 tablet 1  . sucralfate (CARAFATE) 1 g tablet Take 1 g by mouth 4 (four) times daily.    Marland Kitchen tiZANidine (ZANAFLEX) 2 MG tablet TAKE 1/2 TO 1 TABLET BY MOUTH EVERY 6 HOURS AS NEEDED FOR MUSCLE SPASMS 30 tablet 0   No current facility-administered medications on file prior to visit.     Allergies  Allergen Reactions  . Iodine-131 Other (See Comments)    Other reaction(s): Dizziness (intolerance)  .  Sulfamethoxazole Shortness Of Breath    chest tightness  . Amitriptyline Anxiety and Other (See Comments)    Depression, insomnia  . Effexor [Venlafaxine]     palpitations  . Iohexol Other (See Comments)    Passed out  . Lactose Intolerance (Gi) Diarrhea and Nausea And Vomiting  . Lexapro [Escitalopram Oxalate]     nausea  . Iodinated Diagnostic Agents     Pt states at Dr.Grapeys office 15 years ago blacked out for 2 hours during injection for IVP. Pt says she had dye recently with no problems     Family History  Problem Relation Age of Onset  . Colon cancer Mother   . Diabetes Mother   . Hypertension Mother   . Colon polyps Father   . Parkinson's disease Father   . Colon cancer Maternal Grandmother   . Breast cancer Maternal Grandmother 57  . Diabetes Paternal Grandmother   . Breast cancer Paternal Grandmother 40  . Allergies Sister   . Arthritis Brother        b/l hip replace  . Alcohol abuse Daughter   . Arthritis Son   . Hypertension Son   . Allergies Son   . Osteoporosis Son   . Osteoporosis Son   . Arthritis Son        back disease, DDD  . Allergies Son   . Arthritis Son   . Irritable bowel syndrome Son        RSD  . Hypertension Paternal Grandfather   . Heart disease Paternal Grandfather   . Aneurysm Paternal Grandfather   . Colon cancer Maternal Aunt   . Colon cancer Cousin        X3  . Ovarian cancer Cousin     Social History   Socioeconomic History  . Marital status: Married    Spouse name: Not on file  . Number of children: 4  . Years of education: Not on file  . Highest education level: Not on file  Occupational History  . Occupation: Retired  Scientific laboratory technician  . Financial resource strain: Not on file  . Food insecurity:    Worry: Not on file    Inability: Not on file  . Transportation needs:    Medical: Not on file    Non-medical: Not on file  Tobacco Use  . Smoking status: Former Smoker    Last attempt to quit: 08/19/1995    Years since  quitting: 22.4  . Smokeless tobacco: Never Used  Substance and Sexual Activity  . Alcohol use: No    Alcohol/week: 0.0 oz  . Drug use: No    Types: Hydrocodone  . Sexual activity: Not Currently  Birth control/protection: Post-menopausal    Comment: 1st intercourse 68 yo-5 partners  Lifestyle  . Physical activity:    Days per week: Not on file    Minutes per session: Not on file  . Stress: Not on file  Relationships  . Social connections:    Talks on phone: Not on file    Gets together: Not on file    Attends religious service: Not on file    Active member of club or organization: Not on file    Attends meetings of clubs or organizations: Not on file    Relationship status: Not on file  Other Topics Concern  . Not on file  Social History Narrative  . Not on file    Review of Systems - See HPI.  All other ROS are negative.  BP 140/70   Pulse 76   Temp 98 F (36.7 C) (Oral)   Resp 14   Ht 5\' 6"  (1.676 m)   Wt 155 lb (70.3 kg)   SpO2 99%   BMI 25.02 kg/m   Physical Exam  Constitutional: She appears well-developed and well-nourished.  HENT:  Head: Normocephalic and atraumatic.  Right Ear: Tympanic membrane normal.  Left Ear: Tympanic membrane normal.  Mouth/Throat: Uvula is midline. Mucous membranes are not pale. Oral lesions (some erythematous lesions of anterior roof of mouth, about 1 mm in size, without ulceration) present. No uvula swelling. No posterior oropharyngeal edema or posterior oropharyngeal erythema. Tonsils are 0 on the right. Tonsils are 0 on the left. No tonsillar exudate.  Cardiovascular: Normal rate, regular rhythm and normal heart sounds.  Pulmonary/Chest: Effort normal.  Lymphadenopathy:    She has no cervical adenopathy.  Vitals reviewed.   Recent Results (from the past 2160 hour(s))  Pain Mgmt, Profile 8 w/Conf, U     Status: Abnormal   Collection Time: 01/14/18  9:56 AM  Result Value Ref Range   Creatinine 49.7 > or = 20. mg/dL   pH  7.19 4.5 - 9.0   Oxidant NEGATIVE <200 mcg/mL   Amphetamines NEGATIVE <500 ng/mL   medMATCH Amphetamines CONSISTENT    Benzodiazepines POSITIVE (A) <100 ng/mL   Alphahydroxyalprazolam 151 (H) <25 ng/mL    Comment: See Note 1   medMATCH aOH alprazolam INCONSISTENT    Alphahydroxymidazolam NEGATIVE <50 ng/mL    Comment: See Note 1   medMATCH aOH midazolam CONSISTENT    Alphahydroxytriazolam NEGATIVE <50 ng/mL    Comment: See Note 1   medMATCH aOH triazolam CONSISTENT    Aminoclonazepam NEGATIVE <25 ng/mL    Comment: See Note 1   medMATCH Aminoclonazepam CONSISTENT    Hydroxyethylflurazepam NEGATIVE <50 ng/mL    Comment: See Note 1   medMATCH OH,Et flurazepam CONSISTENT    Lorazepam NEGATIVE <50 ng/mL    Comment: See Note 1   medMATCH Lorazepam CONSISTENT    Nordiazepam NEGATIVE <50 ng/mL    Comment: See Note 1   medMATCH Nordiazepam CONSISTENT    Oxazepam NEGATIVE <50 ng/mL    Comment: See Note 1   medMATCH Oxazepam CONSISTENT    Temazepam NEGATIVE <50 ng/mL    Comment: See Note 1   medMATCH Temazepam CONSISTENT    Marijuana Metabolite NEGATIVE <20 ng/mL   medMATCH Marijuana Metab CONSISTENT    Cocaine Metabolite NEGATIVE <150 ng/mL   medMATCH Cocaine Metab CONSISTENT    Opiates NEGATIVE CONFIRMED <100 ng/mL   Codeine NEGATIVE <50 ng/mL    Comment: See Note 1   medMATCH Codeine CONSISTENT  Hydrocodone NEGATIVE <50 ng/mL    Comment: See Note 1   medMATCH Hydrocodone CONSISTENT    Hydromorphone NEGATIVE <50 ng/mL    Comment: See Note 1   medMATCH Hydromorphone CONSISTENT    Morphine NEGATIVE <50 ng/mL    Comment: See Note 1   medMATCH Morphine CONSISTENT    Norhydrocodone NEGATIVE <50 ng/mL    Comment: See Note 1   medMATCH Norhydrocodone CONSISTENT    Oxycodone NEGATIVE <100 ng/mL   medMATCH Oxycodone CONSISTENT     Comment: Note 1 . This test was developed and its analytical performance  characteristics have been determined by [employer]. It has not  been cleared or approved by the FDA. This assay has been validated pursuant to the CLIA  regulations and is used for clinical purposes.    Buprenorphine, Urine NEGATIVE <5 ng/mL   medMATCH Buprenorphine CONSISTENT    MDMA NEGATIVE <500 ng/mL   [facility] MDMA CONSISTENT    Alcohol Metabolites NEGATIVE <500 ng/mL   medMATCH Alcohol Metab CONSISTENT    6 Acetylmorphine NEGATIVE <10 ng/mL   medMATCH 6 Acetylmorphine CONSISTENT     Comment: This drug testing is for medical treatment only.   Analysis was performed as non-forensic testing and  these results should be used only by healthcare  providers to render diagnosis or treatment, or to  monitor progress of medical conditions. Hazel Sams comments are:  - present when drug test results may be the result of     metabolism of one or more drugs or when results are     inconsistent with prescribed medication(s) listed.  - may be blank when drug results are consistent with     prescribed medication(s) listed. . For assistance with interpreting these drug results,  please contact a [employer] Toxicology  Specialist: [phone] [place] [phone]), M-F,  8am-6pm EST. This drug testing is for medical treatment only.   Analysis was performed as non-forensic testing and  these results should be used only by healthcare  providers to render diagnosis or treatment, or to  monitor progress of medical conditions. Hazel Sams comments are:  - present when  drug test results may be the result of     metabolism of one or more drugs or when results are     inconsistent with prescribed medication(s) listed.  - may be blank when drug results are consistent with     prescribed medication(s) listed. . For assistance with interpreting these drug results,  please contact a [employer] Toxicology  Specialist: [phone] [place] [phone]), M-F,  8am-6pm EST. This drug testing is for medical treatment only.   Analysis was performed  as non-forensic testing and  these results should be used only by healthcare  providers to render diagnosis or treatment, or to  monitor progress of medical conditions. Hazel Sams comments are:  - present when drug test results may be the result of     metabolism of one or more drugs or when results are     inconsistent with prescribed medication(s) listed.  - may be blank when drug results are consistent with     prescribed medication(s) listed. . For assistance with interpreting these d rug results,  please contact a [employer] Toxicology  Specialist: [phone] [place] [phone]), M-F,  8am-6pm EST. This drug testing is for medical treatment only.   Analysis was performed as non-forensic testing and  these results should be used only by healthcare  providers to render diagnosis or treatment, or to  monitor progress of medical conditions. Hazel Sams comments are:  - present when drug test results may be the result of     metabolism of one or more drugs or when results are     inconsistent with prescribed medication(s) listed.  - may be blank when drug results are consistent with     prescribed medication(s) listed. . For assistance with interpreting these drug results,  please contact a [employer] Toxicology  Specialist: [phone] [place] [phone]), M-F,  8am-6pm EST. This drug testing is for medical treatment only.   Analysis was performed as non-forensic testing and  these results should be used only by healthcare  provi ders to render diagnosis or treatment, or to  monitor progress of medical conditions. Hazel Sams comments are:  - present when drug test results may be the result of     metabolism of one or more drugs or when results are     inconsistent with prescribed medication(s) listed.  - may be blank when drug results are consistent with     prescribed medication(s) listed. . For assistance with interpreting these drug results,    please contact a [employer] Toxicology  Specialist: [phone] [place] [phone]), M-F,  8am-6pm EST.   POCT rapid strep A     Status: Normal   Collection Time: 01/19/18 10:41 AM  Result Value Ref Range   Rapid Strep A Screen Negative Negative    Assessment/Plan: Sore throat Rapid strep negative. No evidence of significant PND on examination. No lesion of thrush noted. Question recent herpetic stomatitis giving appearance of lesions of roof of mouth. Too late to start anti-viral. Start magic mouthwash with lidocaine. Supportive measures reviewed. ENT if not improving.     Leeanne Rio, PA-C

## 2018-01-19 NOTE — Patient Instructions (Signed)
Please stay well-hydrated and get plenty of rest.  Use the prescription mouth wash as directed. Avoid eating sharp foods that may cut/scrape the lining of the mouth. If not improving with regimen, please follow-up with ENT.

## 2018-01-21 DIAGNOSIS — Z86018 Personal history of other benign neoplasm: Secondary | ICD-10-CM | POA: Diagnosis not present

## 2018-01-21 DIAGNOSIS — K624 Stenosis of anus and rectum: Secondary | ICD-10-CM | POA: Diagnosis not present

## 2018-01-21 DIAGNOSIS — Z800 Family history of malignant neoplasm of digestive organs: Secondary | ICD-10-CM | POA: Diagnosis not present

## 2018-01-21 DIAGNOSIS — Z1211 Encounter for screening for malignant neoplasm of colon: Secondary | ICD-10-CM | POA: Diagnosis not present

## 2018-01-21 DIAGNOSIS — D3A8 Other benign neuroendocrine tumors: Secondary | ICD-10-CM | POA: Diagnosis not present

## 2018-01-21 DIAGNOSIS — Z87891 Personal history of nicotine dependence: Secondary | ICD-10-CM | POA: Diagnosis not present

## 2018-01-21 DIAGNOSIS — K5730 Diverticulosis of large intestine without perforation or abscess without bleeding: Secondary | ICD-10-CM | POA: Diagnosis not present

## 2018-01-21 DIAGNOSIS — Z8603 Personal history of neoplasm of uncertain behavior: Secondary | ICD-10-CM | POA: Diagnosis not present

## 2018-01-21 DIAGNOSIS — K3189 Other diseases of stomach and duodenum: Secondary | ICD-10-CM | POA: Diagnosis not present

## 2018-01-21 DIAGNOSIS — Z9889 Other specified postprocedural states: Secondary | ICD-10-CM | POA: Diagnosis not present

## 2018-01-21 DIAGNOSIS — R110 Nausea: Secondary | ICD-10-CM | POA: Diagnosis not present

## 2018-01-21 DIAGNOSIS — Z13810 Encounter for screening for upper gastrointestinal disorder: Secondary | ICD-10-CM | POA: Diagnosis not present

## 2018-01-21 DIAGNOSIS — R109 Unspecified abdominal pain: Secondary | ICD-10-CM | POA: Diagnosis not present

## 2018-01-21 LAB — HM COLONOSCOPY

## 2018-01-22 DIAGNOSIS — J45909 Unspecified asthma, uncomplicated: Secondary | ICD-10-CM | POA: Diagnosis not present

## 2018-01-25 ENCOUNTER — Encounter: Payer: Self-pay | Admitting: Physician Assistant

## 2018-01-25 MED ORDER — CEFDINIR 300 MG PO CAPS: 300.0000 mg | ORAL_CAPSULE | Freq: Two times a day (BID) | ORAL | 0 refills | Status: DC

## 2018-02-10 ENCOUNTER — Other Ambulatory Visit: Payer: Self-pay | Admitting: Physician Assistant

## 2018-02-11 ENCOUNTER — Other Ambulatory Visit: Payer: Self-pay | Admitting: Physician Assistant

## 2018-02-11 DIAGNOSIS — F341 Dysthymic disorder: Secondary | ICD-10-CM

## 2018-02-11 NOTE — Telephone Encounter (Signed)
Last OV 01/19/18 (Sore Throat), No future OV  Last filled by historical provider.  I see that this was prescribed in the past but does not look current on list of medications.  Please advise if okay to fill or if this needs to be discontinued. Thank you!

## 2018-02-12 ENCOUNTER — Other Ambulatory Visit: Payer: Self-pay

## 2018-02-12 MED ORDER — FLUOXETINE HCL 20 MG PO CAPS: 20.0000 mg | ORAL_CAPSULE | Freq: Every day | ORAL | 1 refills | Status: DC

## 2018-02-12 NOTE — Telephone Encounter (Signed)
This medication has been sent to the pharmacy for this patient.

## 2018-02-12 NOTE — Telephone Encounter (Signed)
Previously prescribed by Dr. Charlett Blake.  Has not taken in some time.  Please call patient to discuss this refill request.

## 2018-02-12 NOTE — Telephone Encounter (Signed)
Ok to refill with quantity 90 and 1 refill.

## 2018-02-12 NOTE — Telephone Encounter (Signed)
Called patient and she stated that [facility] sent in a paper version of the Prozac 20 mg in capsules because the tablets were much more expensive and the capsules are only $7 for her.  She stated that she is currently taking this medication and asked for a refill with more refills if okay? Please advise.

## 2018-02-17 ENCOUNTER — Other Ambulatory Visit: Payer: Self-pay | Admitting: Physician Assistant

## 2018-02-17 NOTE — Telephone Encounter (Signed)
Xanax refilled on 01/13/18 #30 UDS: 01/14/18

## 2018-02-21 DIAGNOSIS — J45909 Unspecified asthma, uncomplicated: Secondary | ICD-10-CM | POA: Diagnosis not present

## 2018-02-23 ENCOUNTER — Encounter: Payer: Self-pay | Admitting: Emergency Medicine

## 2018-02-26 ENCOUNTER — Other Ambulatory Visit: Payer: Self-pay | Admitting: Family Medicine

## 2018-02-26 MED FILL — HYOSCYAMINE ER 0.375 MG TAB: 0.375 | 15 days supply | Qty: 30 | Fill #0

## 2018-02-26 NOTE — Telephone Encounter (Signed)
Last OV 01/19/2018 (Sore Throat), No future OV  Last filled 12/24/17, #30 with 0 refills  Please advise if okay to refill this medication. I do not see a note/message about it so wanted to check first. Thank you!

## 2018-03-09 ENCOUNTER — Other Ambulatory Visit: Payer: Self-pay | Admitting: Physician Assistant

## 2018-03-09 ENCOUNTER — Other Ambulatory Visit: Payer: Self-pay | Admitting: Emergency Medicine

## 2018-03-09 DIAGNOSIS — G44209 Tension-type headache, unspecified, not intractable: Secondary | ICD-10-CM

## 2018-03-09 MED ORDER — TIZANIDINE HCL 2 MG PO TABS: ORAL_TABLET | ORAL | 0 refills | Status: DC

## 2018-03-09 NOTE — Telephone Encounter (Signed)
Tizanidine was last filled on 08/04/17 unsure of reason for refill of medication   Copied from [place] [id number]. Topic: General - Other >> Mar 09, 2018 11:36 AM Oneta Rack wrote: Caller name: Mr. Pixley  Relation to pt: spouse  Call back number: [phone] Pharmacy:[facility] [place] [phone] (Phone) [phone] (Fax)   Reason for call:  Spouse requesting 1 month supply of tiZANidine (ZANAFLEX) 2 MG tablet, informed please allow 48 to 72 hour turn around time

## 2018-03-15 ENCOUNTER — Telehealth: Payer: Self-pay | Admitting: Cardiovascular Disease

## 2018-03-15 NOTE — Telephone Encounter (Signed)
Pt calling requesting a refill on Cartia XT 180 mg tablet. Dr. Charlett Blake has been refilling this medication. Pt stated that she does not see Dr. Charlett Blake anymore and would like Dr. Burt Knack to refill this medication and send it to [facility] on [address] Please address

## 2018-03-16 MED ORDER — DILTIAZEM HCL ER COATED BEADS 180 MG PO CP24: 180.0000 mg | ORAL_CAPSULE | Freq: Every day | ORAL | 1 refills | Status: DC

## 2018-03-16 NOTE — Telephone Encounter (Signed)
Rx sent 

## 2018-03-22 ENCOUNTER — Other Ambulatory Visit: Payer: Self-pay | Admitting: Physician Assistant

## 2018-03-22 NOTE — Telephone Encounter (Signed)
Last OV 01/19/18, No future OV  Last filled 02/17/18, # 30 with 0 refill

## 2018-03-24 DIAGNOSIS — H2513 Age-related nuclear cataract, bilateral: Secondary | ICD-10-CM | POA: Diagnosis not present

## 2018-03-24 DIAGNOSIS — H40033 Anatomical narrow angle, bilateral: Secondary | ICD-10-CM | POA: Diagnosis not present

## 2018-03-24 DIAGNOSIS — J45909 Unspecified asthma, uncomplicated: Secondary | ICD-10-CM | POA: Diagnosis not present

## 2018-03-24 DIAGNOSIS — Z961 Presence of intraocular lens: Secondary | ICD-10-CM | POA: Diagnosis not present

## 2018-03-24 DIAGNOSIS — H40053 Ocular hypertension, bilateral: Secondary | ICD-10-CM | POA: Diagnosis not present

## 2018-03-24 DIAGNOSIS — R51 Headache: Secondary | ICD-10-CM | POA: Diagnosis not present

## 2018-03-25 DIAGNOSIS — R51 Headache: Secondary | ICD-10-CM | POA: Diagnosis not present

## 2018-03-25 DIAGNOSIS — H6982 Other specified disorders of Eustachian tube, left ear: Secondary | ICD-10-CM | POA: Diagnosis not present

## 2018-03-25 DIAGNOSIS — M316 Other giant cell arteritis: Secondary | ICD-10-CM | POA: Diagnosis not present

## 2018-03-25 DIAGNOSIS — G4452 New daily persistent headache (NDPH): Secondary | ICD-10-CM | POA: Diagnosis not present

## 2018-03-25 DIAGNOSIS — H6992 Unspecified Eustachian tube disorder, left ear: Secondary | ICD-10-CM | POA: Diagnosis not present

## 2018-03-29 ENCOUNTER — Encounter: Payer: Self-pay | Admitting: Physician Assistant

## 2018-03-30 ENCOUNTER — Emergency Department (HOSPITAL_COMMUNITY)
Admission: EM | Admit: 2018-03-30 | Discharge: 2018-03-30 | Disposition: A | Discharge status: Home / Self Care | Payer: Medicare HMO | Attending: Emergency Medicine | Admitting: Emergency Medicine

## 2018-03-30 ENCOUNTER — Other Ambulatory Visit: Payer: Self-pay

## 2018-03-30 DIAGNOSIS — R5383 Other fatigue: Secondary | ICD-10-CM | POA: Diagnosis not present

## 2018-03-30 DIAGNOSIS — L568 Other specified acute skin changes due to ultraviolet radiation: Secondary | ICD-10-CM | POA: Insufficient documentation

## 2018-03-30 DIAGNOSIS — X32XXXA Exposure to sunlight, initial encounter: Secondary | ICD-10-CM | POA: Diagnosis not present

## 2018-03-30 DIAGNOSIS — Z5321 Procedure and treatment not carried out due to patient leaving prior to being seen by health care provider: Secondary | ICD-10-CM | POA: Insufficient documentation

## 2018-03-30 DIAGNOSIS — R110 Nausea: Secondary | ICD-10-CM | POA: Diagnosis not present

## 2018-03-30 DIAGNOSIS — R51 Headache: Secondary | ICD-10-CM | POA: Diagnosis not present

## 2018-03-30 LAB — COMPREHENSIVE METABOLIC PANEL
ALK PHOS: 106 U/L (ref 38–126)
ALT: 16 U/L (ref 0–44)
ANION GAP: 9 (ref 5–15)
AST: 21 U/L (ref 15–41)
Albumin: 3.9 g/dL (ref 3.5–5.0)
BUN: 5 mg/dL — ABNORMAL LOW (ref 8–23)
CALCIUM: 9.4 mg/dL (ref 8.9–10.3)
CO2: 28 mmol/L (ref 22–32)
Chloride: 103 mmol/L (ref 98–111)
Creatinine, Ser: 0.84 mg/dL (ref 0.44–1.00)
GFR calc non Af Amer: >60 mL/min (ref >60)
Glucose, Bld: 104 mg/dL — ABNORMAL HIGH (ref 70–99)
Potassium: 4.2 mmol/L (ref 3.5–5.1)
SODIUM: 140 mmol/L (ref 135–145)
Total Bilirubin: 0.6 mg/dL (ref 0.3–1.2)
Total Protein: 7.3 g/dL (ref 6.5–8.1)

## 2018-03-30 LAB — CBC
HCT: 43.1 % (ref 36.0–46.0)
Hemoglobin: 13.8 g/dL (ref 12.0–15.0)
MCH: 30.4 pg (ref 26.0–34.0)
MCHC: 32 g/dL (ref 30.0–36.0)
MCV: 94.9 fL (ref 78.0–100.0)
PLATELETS: 309 10*3/uL (ref 150–400)
RBC: 4.54 MIL/uL (ref 3.87–5.11)
RDW: 12.1 % (ref 11.5–15.5)
WBC: 7.5 10*3/uL (ref 4.0–10.5)

## 2018-03-30 LAB — DIFFERENTIAL
Abs Immature Granulocytes: 0 10*3/uL (ref 0.0–0.1)
BASOS ABS: 0 10*3/uL (ref 0.0–0.1)
BASOS PCT: 1 %
EOS PCT: 2 %
Eosinophils Absolute: 0.2 10*3/uL (ref 0.0–0.7)
Immature Granulocytes: 0 %
Lymphocytes Relative: 38 %
Lymphs Abs: 2.9 10*3/uL (ref 0.7–4.0)
Monocytes Absolute: 0.7 10*3/uL (ref 0.1–1.0)
Monocytes Relative: 9 %
NEUTROS PCT: 50 %
Neutro Abs: 3.8 10*3/uL (ref 1.7–7.7)

## 2018-03-30 LAB — I-STAT TROPONIN, ED: Troponin i, poc: 0.01 ng/mL (ref 0.00–0.08)

## 2018-03-30 LAB — I-STAT CHEM 8, ED
BUN: 6 mg/dL — ABNORMAL LOW (ref 8–23)
CALCIUM ION: 1.17 mmol/L (ref 1.15–1.40)
CHLORIDE: 102 mmol/L (ref 98–111)
Creatinine, Ser: 0.8 mg/dL (ref 0.44–1.00)
Glucose, Bld: 103 mg/dL — ABNORMAL HIGH (ref 70–99)
HCT: 43 % (ref 36.0–46.0)
HEMOGLOBIN: 14.6 g/dL (ref 12.0–15.0)
Potassium: 4.1 mmol/L (ref 3.5–5.1)
SODIUM: 141 mmol/L (ref 135–145)
TCO2: 28 mmol/L (ref 22–32)

## 2018-03-30 LAB — APTT: APTT: 25 s (ref 24–36)

## 2018-03-30 LAB — PROTIME-INR
INR: 0.98
PROTHROMBIN TIME: 12.9 s (ref 11.4–15.2)

## 2018-03-30 NOTE — ED Triage Notes (Signed)
Patient to ED c/o recurrent, but worse headache upon waking this morning at 6am as well as new "mouth twitching" on the left side. She reports LSN last night when going to bed. She reports hx migraines, this headache is the same in nature, but more intense, is seeing ENT for it. She put eye drops in as usual and headache is currently better than it was at 6am. She reports increasing fatigue over the last couple of weeks as well. No extremity weakness or notable focal symptoms noted in triage. Patient ambulatory in triage. Also endorses nausea and photo-sensitivity.

## 2018-03-30 NOTE — ED Notes (Signed)
Patient spoke with registration before this RN could speak with her regarding leaving without being seen after triage. Per registration, patient ambulated without difficulty through ED entrance and stated that her husband would be driving her to see her doctor instead of waiting here.

## 2018-03-31 ENCOUNTER — Encounter: Payer: Self-pay | Admitting: Physician Assistant

## 2018-03-31 DIAGNOSIS — H40053 Ocular hypertension, bilateral: Secondary | ICD-10-CM | POA: Diagnosis not present

## 2018-03-31 DIAGNOSIS — H2513 Age-related nuclear cataract, bilateral: Secondary | ICD-10-CM | POA: Diagnosis not present

## 2018-03-31 DIAGNOSIS — H40033 Anatomical narrow angle, bilateral: Secondary | ICD-10-CM | POA: Diagnosis not present

## 2018-03-31 DIAGNOSIS — R51 Headache: Secondary | ICD-10-CM | POA: Diagnosis not present

## 2018-03-31 DIAGNOSIS — Z961 Presence of intraocular lens: Secondary | ICD-10-CM | POA: Diagnosis not present

## 2018-03-31 NOTE — ED Notes (Signed)
Follow up call made  No answer  03/31/18  0825  s Carlea Badour rn

## 2018-04-07 ENCOUNTER — Ambulatory Visit: Payer: Medicare HMO | Admitting: Neurology

## 2018-04-07 ENCOUNTER — Encounter: Payer: Self-pay | Admitting: Neurology

## 2018-04-07 VITALS — BP 148/81 | HR 65 | Ht 66.0 in | Wt 156.5 lb

## 2018-04-07 DIAGNOSIS — R799 Abnormal finding of blood chemistry, unspecified: Secondary | ICD-10-CM | POA: Diagnosis not present

## 2018-04-07 DIAGNOSIS — IMO0002 Reserved for concepts with insufficient information to code with codable children: Secondary | ICD-10-CM | POA: Insufficient documentation

## 2018-04-07 DIAGNOSIS — G43709 Chronic migraine without aura, not intractable, without status migrainosus: Secondary | ICD-10-CM | POA: Diagnosis not present

## 2018-04-07 DIAGNOSIS — R51 Headache: Secondary | ICD-10-CM | POA: Diagnosis not present

## 2018-04-07 DIAGNOSIS — R519 Headache, unspecified: Secondary | ICD-10-CM

## 2018-04-07 DIAGNOSIS — R52 Pain, unspecified: Secondary | ICD-10-CM | POA: Diagnosis not present

## 2018-04-07 MED ORDER — ONDANSETRON 4 MG PO TBDP: 4.0000 mg | ORAL_TABLET | Freq: Three times a day (TID) | ORAL | 6 refills | Status: DC | PRN

## 2018-04-07 MED ORDER — INDOMETHACIN 50 MG PO CAPS: 50.0000 mg | ORAL_CAPSULE | Freq: Two times a day (BID) | ORAL | 3 refills | Status: DC | PRN

## 2018-04-07 MED ORDER — GABAPENTIN 100 MG PO CAPS: 100.0000 mg | ORAL_CAPSULE | Freq: Three times a day (TID) | ORAL | 11 refills | Status: DC

## 2018-04-07 MED ORDER — SUMATRIPTAN SUCCINATE 50 MG PO TABS: 50.0000 mg | ORAL_TABLET | ORAL | 6 refills | Status: DC | PRN

## 2018-04-07 NOTE — Progress Notes (Signed)
PATIENT: Megan [name] DOB: Jun 19, 1951  Chief Complaint  Patient presents with  . Headache    Reports daily left-sided headaches that vary in intensity. States she always has a dull pain.  She is using OTC Advil and/or Tylenol everday.    [facility]    Warden Fillers, MD - referring provider  . PCP    Brunetta Jeans, PA-C     HISTORICAL  Megan [name] is a 67 years old female,  seen in request by her primary care physician PA Brunetta Jeans, [place] and ophthalmologist Dr. Warden Fillers for evaluation of headaches, initial evaluation was on April 07, 2018.  She has past medical history of depression anxiety, had intolerance to multiple different agents in the past, including amitriptyline, Effexor, but she denied previous history of headaches, early 2019, she began to have frequent left retro-orbital area pressure headaches, initially thought it was due to sinus issues, was evaluated by ENT Dr. Erik Obey, reported normal CT sinus in 2019.  She had left cataract surgery by Dr. Katy Fitch in April 2019, left vision overall recovered very well, but since her cataract surgery in April, left-sided headache has become worse, constant pressure, 5 out of 10 on a daily basis, often exacerbated to a more severe pounding headache with associated light noise sensitivity, nauseous, she tends to lying in bed for hours to help her headaches, she has tried over-the-counter ibuprofen and Tylenol with limited help,  We personally reviewed MRI of the brain in May 2017 for evaluation of acute onset right facial droop and numbness, there was no acute abnormality mild supratentorium small vessel disease,  Reported normal ESR C-reactive protein by Dr. Zenia Resides office  REVIEW OF SYSTEMS: Full 14 system review of systems performed and notable only for fever, chills, fatigue, murmur, hearing loss, trouble swallowing, blurred vision, eye pain, constipation, feeling hot, cold, memory loss,  confusion, headaches, weakness, insomnia, dizziness, depression, anxiety, decreased energy, disinterested in activities  ALLERGIES: Allergies  Allergen Reactions  . Iodine-131 Other (See Comments)    Other reaction(s): Dizziness (intolerance)  . Sulfamethoxazole Shortness Of Breath    chest tightness  . Amitriptyline Anxiety and Other (See Comments)    Depression, insomnia  . Effexor [Venlafaxine]     palpitations  . Iohexol Other (See Comments)    Passed out  . Lactose Intolerance (Gi) Diarrhea and Nausea And Vomiting  . Lexapro [Escitalopram Oxalate]     nausea  . Iodinated Diagnostic Agents     Pt states at Dr.Grapeys office 15 years ago blacked out for 2 hours during injection for IVP. Pt says she had dye recently with no problems     HOME MEDICATIONS: Current Outpatient Medications  Medication Sig Dispense Refill  . albuterol (PROVENTIL HFA;VENTOLIN HFA) 108 (90 Base) MCG/ACT inhaler Inhale 2 puffs into the lungs every 6 (six) hours as needed for wheezing or shortness of breath. 1 Inhaler 0  . albuterol (PROVENTIL) (2.5 MG/3ML) 0.083% nebulizer solution Take 3 mLs (2.5 mg total) by nebulization every 6 (six) hours as needed for wheezing or shortness of breath. 150 mL 3  . ALPRAZolam (XANAX) 1 MG tablet TAKE 1 TABLET BY MOUTH TWICE DAILY AS NEEDED FOR ANXIETY 30 tablet 0  . aspirin EC 81 MG tablet Take 1 tablet (81 mg total) by mouth daily.    [facility] azelastine (ASTELIN) 0.1 % nasal spray Place 1 spray into both nostrils 2 (two) times daily as needed for rhinitis. Use in each nostril as directed    .  Cholecalciferol (VITAMIN D-3) 5000 units TABS Take 1 tablet by mouth daily. 30 tablet 0  . diltiazem (CARTIA XT) 180 MG 24 hr capsule Take 1 capsule (180 mg total) by mouth daily. 90 capsule 1  . FLUoxetine (PROZAC) 20 MG capsule Take 1 capsule (20 mg total) by mouth daily. 90 capsule 1  . fluticasone (FLONASE) 50 MCG/ACT nasal spray INSTILL 2 SPRAYS IN EACH NOSTRIL EVERY DAY 16 g 0  .  hydrochlorothiazide (MICROZIDE) 12.5 MG capsule TK ONE C PO D  11  . magic mouthwash w/lidocaine SOLN Take 5 mLs by mouth 3 (three) times daily as needed for mouth pain. 100 mL 0  . ondansetron (ZOFRAN) 4 MG tablet TAKE 1 TABLET BY MOUTH EVERY 8 HOURS AS NEEDED FOR NAUSEA AND VOMITING 20 tablet 0  . OSCIMIN SR 0.375 MG 12 hr tablet TAKE 1 TABLET BY MOUTH 2 (TWO) TIMES DAILY. 30 tablet 0  . promethazine (PHENERGAN) 25 MG tablet TAKE 1 TABLET BY MOUTH THREE TIMES DAILY AS NEEDED FOR NAUSEA AND VOMITING 30 tablet 0  . ranitidine (ZANTAC) 300 MG tablet TAKE 1 TABLET AT BEDTIME 90 tablet 1  . sucralfate (CARAFATE) 1 g tablet Take 1 g by mouth 4 (four) times daily.    Marland Kitchen tiZANidine (ZANAFLEX) 2 MG tablet TAKE 1/2 TO 1 TABLET BY MOUTH EVERY 6 HOURS AS NEEDED FOR MUSCLE SPASMS 30 tablet 0   No current facility-administered medications for this visit.     PAST MEDICAL HISTORY: Past Medical History:  Diagnosis Date  . 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 Allergic state 12/31/2012  . Anxiety   . Asthma   . Barrett esophagus   . Blind loop syndrome   . C. difficile diarrhea   . Cancer ([place])   . Chest pain, atypical 07/14/2011  . Cold sore 07/17/2016  . Colon polyp   . Contact dermatitis 03/23/2012  . Cystocele, midline   . Depression   . Diarrhea   . Diverticulosis   . Endometrial hyperplasia 02/27/2006   BENIGN ENDO BX ON 02/2007  . GERD (gastroesophageal reflux disease)   . Headache 04/16/2015  . Headache   . Headache(784.0) 04/15/2012  . Hematuria 04/27/2012  . Hepatic cyst   . Hiatal hernia   . Hiatal hernia   . Hyperlipidemia, mixed 12/17/2014  . Hypertension   . IBS (irritable bowel syndrome)   . Insomnia   . Leaky heart valve   . Low back pain 08/15/2013  . Osteoporosis 03/2017   T score -2.5. Without significant loss from prior studies  . 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 Paronychia of great toe, left 06/19/2013  . Preventative health care 09/11/2014  .  Rectal bleeding 10/10/2011  . Rectocele   . 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 Sinusitis acute 02/03/2012  . Thrush 07/21/2011  . 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 Unspecified hypothyroidism   . Unspecified menopausal and postmenopausal disorder   . Uterine prolapse without mention of vaginal wall prolapse   . Vitamin B12 deficiency   . Vitamin D deficiency 07/17/2016    PAST SURGICAL HISTORY: Past Surgical History:  Procedure Laterality Date  . APPENDECTOMY    . endoscopic hemoclip    . eye surgery Left    lens implant  . HYSTEROSCOPY     POLYP  . PERIPHERAL VASCULAR CATHETERIZATION Left 07/24/2016   Procedure: Renal Angiography;  Surgeon: Conrad Corunna, MD;  Location: [facility];  Service: Cardiovascular;  Laterality:  Left;  . UPPER GI ENDOSCOPY    . UTERINE FIBROID SURGERY      FAMILY HISTORY: Family History  Problem Relation Age of Onset  . Colon cancer Mother   . Diabetes Mother   . Hypertension Mother   . Colon polyps Father   . Parkinson's disease Father   . Colon cancer Maternal Grandmother   . Breast cancer Maternal Grandmother 67  . Diabetes Paternal Grandmother   . Breast cancer Paternal Grandmother 31  . Allergies Sister   . Arthritis Brother        b/l hip replace  . Alcohol abuse Daughter   . Arthritis Son   . Hypertension Son   . Allergies Son   . Osteoporosis Son   . Osteoporosis Son   . Arthritis Son        back disease, DDD  . Allergies Son   . Arthritis Son   . Irritable bowel syndrome Son        RSD  . Hypertension Paternal Grandfather   . Heart disease Paternal Grandfather   . Aneurysm Paternal Grandfather   . Colon cancer Maternal Aunt   . Colon cancer Cousin        X3  . Ovarian cancer Cousin     SOCIAL HISTORY: Social History   Socioeconomic History  . Marital status: Married    Spouse name: Not on file  . Number of children: 4  . Years of education: 65  . Highest education level: High school graduate    Occupational History  . Occupation: Retired  Scientific laboratory technician  . Financial resource strain: Not on file  . Food insecurity:    Worry: Not on file    Inability: Not on file  . Transportation needs:    Medical: Not on file    Non-medical: Not on file  Tobacco Use  . Smoking status: Former Smoker    Last attempt to quit: 08/19/1995    Years since quitting: 22.6  . Smokeless tobacco: Never Used  Substance and Sexual Activity  . Alcohol use: No    Alcohol/week: 0.0 standard drinks  . Drug use: No    Types: Hydrocodone  . Sexual activity: Not Currently    Birth control/protection: Post-menopausal    Comment: 1st intercourse 48 yo-5 partners  Lifestyle  . Physical activity:    Days per week: Not on file    Minutes per session: Not on file  . Stress: Not on file  Relationships  . Social connections:    Talks on phone: Not on file    Gets together: Not on file    Attends religious service: Not on file    Active member of club or organization: Not on file    Attends meetings of clubs or organizations: Not on file    Relationship status: Not on file  . Intimate partner violence:    Fear of current or ex partner: Not on file    Emotionally abused: Not on file    Physically abused: Not on file    Forced sexual activity: Not on file  Other Topics Concern  . Not on file  Social History Narrative   Lives at home with her husband.   Right-handed.   2 cups caffeine per day.     PHYSICAL EXAM   Vitals:   04/07/18 0830  BP: (!) 148/81  Pulse: 65  Weight: 156 lb 8 oz (71 kg)  Height: _0  (1.676 m)    Not recorded  Body mass index is 25.26 kg/m.  PHYSICAL EXAMNIATION:  Gen: NAD, conversant, well nourised, obese, well groomed                     Cardiovascular: Regular rate rhythm, no peripheral edema, warm, nontender. Eyes: Conjunctivae clear without exudates or hemorrhage Neck: Supple, no carotid bruits. Pulmonary: Clear to auscultation bilaterally    NEUROLOGICAL EXAM:  MENTAL STATUS: Speech:    Speech is normal; fluent and spontaneous with normal comprehension.  Cognition:     Orientation to time, place and person     Normal recent and remote memory     Normal Attention span and concentration     Normal Language, naming, repeating,spontaneous speech     Fund of knowledge   CRANIAL NERVES: CN II: Visual fields are full to confrontation. Fundoscopic exam is normal with sharp discs and no vascular changes. Pupils are round equal and briskly reactive to light. CN III, IV, VI: extraocular movement are normal. No ptosis. CN V: Facial sensation is intact to pinprick in all 3 divisions bilaterally. Corneal responses are intact.  CN VII: Face is symmetric with normal eye closure and smile. CN VIII: Hearing is normal to rubbing fingers CN IX, X: Palate elevates symmetrically. Phonation is normal. CN XI: Head turning and shoulder shrug are intact CN XII: Tongue is midline with normal movements and no atrophy.  MOTOR: There is no pronator drift of out-stretched arms. Muscle bulk and tone are normal. Muscle strength is normal.  REFLEXES: Reflexes are 2+ and symmetric at the biceps, triceps, knees, and ankles. Plantar responses are flexor.  SENSORY: Intact to light touch, pinprick, positional sensation and vibratory sensation are intact in fingers and toes.  COORDINATION: Rapid alternating movements and fine finger movements are intact. There is no dysmetria on finger-to-nose and heel-knee-shin.    GAIT/STANCE: Posture is normal. Gait is steady with normal steps, base, arm swing, and turning. Heel and toe walking are normal. Tandem gait is normal.  Romberg is absent.   DIAGNOSTIC DATA (LABS, IMAGING, TESTING) - I reviewed patient records, labs, notes, testing and imaging myself where available.   ASSESSMENT AND PLAN  EMMANI LESUEUR is a 67 y.o. female   New onset persistent left-sided headaches  Has a lot of migraine  features, proceed with Imitrex 50 mg as needed  She has intolerance to many medications including nortriptyline, Effexor in the past, will try gabapentin 100 mg 3 times daily as migraine prevention  May mix Imitrex with indomethacin 50 mg as needed, Zofran, tizanidine as needed  TSH  Get laboratory evaluation ESR C-reactive protein from Dr. Velvet Bathe office   Marcial Pacas, M.D. Ph.D.  [facility] [address] [place], [place] [address] Ph: [phone] Fax: [phone]  CC:  Jefm Bryant, MD

## 2018-04-08 ENCOUNTER — Telehealth: Payer: Self-pay | Admitting: *Deleted

## 2018-04-08 LAB — TSH: TSH: 1.35 u[IU]/mL (ref 0.450–4.500)

## 2018-04-08 NOTE — Telephone Encounter (Signed)
Received labs from Dr. Zenia Resides office (collected on 03/25/18):  CBC w/ diff: wnl  Sed Rate: 7  C-Reactive Protein: <1   Dr. Krista Blue has reviewed results.

## 2018-04-08 NOTE — Progress Notes (Signed)
C-

## 2018-04-12 ENCOUNTER — Other Ambulatory Visit: Payer: Self-pay | Admitting: Otolaryngology

## 2018-04-12 DIAGNOSIS — G4452 New daily persistent headache (NDPH): Secondary | ICD-10-CM

## 2018-04-15 ENCOUNTER — Ambulatory Visit
Admission: RE | Admit: 2018-04-15 | Discharge: 2018-04-15 | Disposition: A | Discharge status: Home / Self Care | Payer: Medicare HMO | Source: Ambulatory Visit | Attending: Otolaryngology | Admitting: Otolaryngology

## 2018-04-15 DIAGNOSIS — G4452 New daily persistent headache (NDPH): Secondary | ICD-10-CM

## 2018-04-15 DIAGNOSIS — R410 Disorientation, unspecified: Secondary | ICD-10-CM | POA: Diagnosis not present

## 2018-04-15 MED ORDER — GADOBENATE DIMEGLUMINE 529 MG/ML IV SOLN
15.0000 mL | Freq: Once | INTRAVENOUS | Status: AC | PRN
  Administered 2018-04-15: 15 mL via INTRAVENOUS

## 2018-04-20 ENCOUNTER — Other Ambulatory Visit: Payer: Self-pay | Admitting: Physician Assistant

## 2018-04-20 NOTE — Telephone Encounter (Signed)
Last OV 01/19/18, No future OV  Last filled 03/23/18, # 30 with 0 refills

## 2018-04-20 NOTE — Telephone Encounter (Signed)
Discussed with Einar Pheasant and UDS was updated on 01/14/18. Prescription has been faxed to the pharmacy for patient. Called patient and let her know. No UDS is needed at this time. Patient verbalized an understanding.

## 2018-04-20 NOTE — Telephone Encounter (Signed)
Called patient and she stated that she had this done recently and her urine test cost her over $300. Patient asked if she needed to have this done again?  Please advise.

## 2018-04-24 DIAGNOSIS — J45909 Unspecified asthma, uncomplicated: Secondary | ICD-10-CM | POA: Diagnosis not present

## 2018-05-05 ENCOUNTER — Other Ambulatory Visit: Payer: Self-pay

## 2018-05-05 ENCOUNTER — Other Ambulatory Visit: Payer: Self-pay | Admitting: *Deleted

## 2018-05-05 ENCOUNTER — Other Ambulatory Visit: Payer: Self-pay | Admitting: Physician Assistant

## 2018-05-05 MED ORDER — HYDROCHLOROTHIAZIDE 12.5 MG PO CAPS: ORAL_CAPSULE | ORAL | 0 refills | Status: DC

## 2018-05-05 MED ORDER — GABAPENTIN 100 MG PO CAPS: 100.0000 mg | ORAL_CAPSULE | Freq: Three times a day (TID) | ORAL | 3 refills | Status: DC

## 2018-05-05 MED ORDER — SUMATRIPTAN SUCCINATE 50 MG PO TABS: ORAL_TABLET | ORAL | 3 refills | Status: DC

## 2018-05-06 NOTE — Telephone Encounter (Signed)
Trazodone refill requested, routed to appropriate PCP.

## 2018-05-07 ENCOUNTER — Other Ambulatory Visit: Payer: Self-pay | Admitting: Cardiovascular Disease

## 2018-05-07 ENCOUNTER — Other Ambulatory Visit: Payer: Self-pay | Admitting: Neurology

## 2018-05-07 MED ORDER — HYDROCHLOROTHIAZIDE 12.5 MG PO CAPS: 12.5000 mg | ORAL_CAPSULE | Freq: Every day | ORAL | 0 refills | Status: DC

## 2018-05-12 ENCOUNTER — Other Ambulatory Visit: Payer: Self-pay | Admitting: Physician Assistant

## 2018-05-12 NOTE — Telephone Encounter (Signed)
Left message to call back and confirm medications she is currently taking.

## 2018-05-13 NOTE — Telephone Encounter (Signed)
Refill request of Xanax last filled 04/20/18 bid prn #30 CSC:01/14/18 UDS:01/14/18  Please advise of refill

## 2018-05-14 ENCOUNTER — Telehealth: Payer: Self-pay | Admitting: Cardiovascular Disease

## 2018-05-14 ENCOUNTER — Other Ambulatory Visit: Payer: Self-pay | Admitting: Cardiovascular Disease

## 2018-05-14 MED ORDER — HYDROCHLOROTHIAZIDE 12.5 MG PO CAPS: 12.5000 mg | ORAL_CAPSULE | Freq: Every day | ORAL | 0 refills | Status: DC

## 2018-05-14 NOTE — Telephone Encounter (Signed)
Left message for the patient that her MyChart message was received and her information was forwarded to Dr. Burt Knack. Left instruction for her to continue taking what she is taking currently and she will be called if Dr. Burt Knack wants to change her medications.

## 2018-05-14 NOTE — Telephone Encounter (Signed)
Follow Up:     Pt said she was returning your call from this week. She said she answered you on My-Chart. She wants to make sure you received her reply.

## 2018-05-18 DIAGNOSIS — Z961 Presence of intraocular lens: Secondary | ICD-10-CM | POA: Diagnosis not present

## 2018-05-18 DIAGNOSIS — H40053 Ocular hypertension, bilateral: Secondary | ICD-10-CM | POA: Diagnosis not present

## 2018-05-18 DIAGNOSIS — R51 Headache: Secondary | ICD-10-CM | POA: Diagnosis not present

## 2018-05-18 DIAGNOSIS — H40033 Anatomical narrow angle, bilateral: Secondary | ICD-10-CM | POA: Diagnosis not present

## 2018-05-18 DIAGNOSIS — H2513 Age-related nuclear cataract, bilateral: Secondary | ICD-10-CM | POA: Diagnosis not present

## 2018-05-18 NOTE — Telephone Encounter (Signed)
See MyChart messages.   Med list updated.

## 2018-05-24 DIAGNOSIS — J45909 Unspecified asthma, uncomplicated: Secondary | ICD-10-CM | POA: Diagnosis not present

## 2018-05-27 DIAGNOSIS — H2511 Age-related nuclear cataract, right eye: Secondary | ICD-10-CM | POA: Diagnosis not present

## 2018-06-03 DIAGNOSIS — H2511 Age-related nuclear cataract, right eye: Secondary | ICD-10-CM | POA: Diagnosis not present

## 2018-06-14 ENCOUNTER — Telehealth: Payer: Self-pay | Admitting: Physician Assistant

## 2018-06-14 NOTE — Telephone Encounter (Signed)
Xanax last filled on 05/14/18 #30 Last OV: 01/29/18

## 2018-06-15 ENCOUNTER — Other Ambulatory Visit: Payer: Self-pay | Admitting: Physician Assistant

## 2018-06-15 MED ORDER — ALPRAZOLAM 1 MG PO TABS: 1.0000 mg | ORAL_TABLET | Freq: Two times a day (BID) | ORAL | 0 refills | Status: DC | PRN

## 2018-06-15 NOTE — Telephone Encounter (Signed)
Rx has been resent 

## 2018-06-15 NOTE — Telephone Encounter (Signed)
Pt husband called stating he saw Einar Pheasant this morning and was advised xanax would be sent in. Appt scheduled for 06/30/18 with pt.  Note: use [facility]   [place], [place]

## 2018-06-15 NOTE — Addendum Note (Signed)
Addended by: Brunetta Jeans on: 06/15/2018 03:08 PM   Modules accepted: Orders

## 2018-06-15 NOTE — Telephone Encounter (Signed)
LM for patient's husband that medication has been called in.

## 2018-06-18 ENCOUNTER — Other Ambulatory Visit: Payer: Self-pay | Admitting: Physician Assistant

## 2018-06-24 DIAGNOSIS — J45909 Unspecified asthma, uncomplicated: Secondary | ICD-10-CM | POA: Diagnosis not present

## 2018-06-29 NOTE — Progress Notes (Signed)
Subjective:   Megan [name] is a 67 y.o. female who presents for an Initial Medicare Annual Wellness Visit.  Review of Systems    No ROS.  Medicare Wellness Visit. Additional risk factors are reflected in the social history.  Cardiac Risk Factors include: advanced age (>65men, >71 women);dyslipidemia;hypertension;sedentary lifestyle;smoking/ tobacco exposure;family history of premature cardiovascular disease   Sleep patterns: Sleeps 4-5 hours, awakens frequently.  Home Safety/Smoke Alarms: Feels safe in home. Smoke alarms in place.  Living environment; residence and Firearm Safety: Lives with husband in 1 story home, rail at steps.  Seat Belt Safety/Bike Helmet: Wears seat belt.   Female:   [id number]       Mammo-12/31/2015, negative. Ordered today, [facility].        Dexa scan-04/14/2017, Osteoporosis.    CCS-01/21/2018, normal. Recall 5 years (fmhx)     Objective:    Today's Vitals   06/30/18 0906  BP: (!) 150/80  Pulse: 64  Resp: 16  Temp: 98.3 F (36.8 C)  TempSrc: Temporal  SpO2: 98%  Weight: 162 lb (73.5 kg)  Height: 5\' 6"  (1.676 m)  PainSc: 6    Body mass index is 26.15 kg/m.  Advanced Directives 06/30/2018 07/24/2016 07/16/2016 05/28/2015 04/26/2015 12/05/2014 11/24/2014  Does Patient Have a Medical Advance Directive? No No No Yes No No No  Type of Advance Directive - - - [place]  Does patient want to make changes to medical advance directive? - - - No - Patient declined - - -  Copy of [place] in Chart? - - - No - copy requested - - -  Would patient like information on creating a medical advance directive? No - Patient declined No - Patient declined - - No - patient declined information No - patient declined information No - patient declined information    Current Medications (verified) Outpatient Encounter Medications as of 06/30/2018  Medication Sig  . albuterol (PROVENTIL) (2.5 MG/3ML) 0.083% nebulizer  solution Take 3 mLs (2.5 mg total) by nebulization every 6 (six) hours as needed for wheezing or shortness of breath.  . ALPRAZolam (XANAX) 1 MG tablet Take 1 tablet (1 mg total) by mouth 2 (two) times daily as needed. for anxiety  . aspirin EC 81 MG tablet Take 1 tablet (81 mg total) by mouth daily.  Marland Kitchen azelastine (ASTELIN) 0.1 % nasal spray Place 1 spray into both nostrils 2 (two) times daily as needed for rhinitis. Use in each nostril as directed  . Biotin 1000 MCG tablet Take 1,000 mcg by mouth 3 (three) times daily.  . carvedilol (COREG) 12.5 MG tablet Take 12.5 mg by mouth daily.  . Cholecalciferol (VITAMIN D-3) 5000 units TABS Take 1 tablet by mouth daily.  Marland Kitchen diltiazem (CARTIA XT) 180 MG 24 hr capsule Take 1 capsule (180 mg total) by mouth daily.  . dorzolamide-timolol (COSOPT) 22.3-6.8 MG/ML ophthalmic solution   . doxycycline (VIBRA-TABS) 100 MG tablet Take 100 mg by mouth daily.  Marland Kitchen FLUoxetine (PROZAC) 20 MG capsule TAKE 1 CAPSULE BY MOUTH DAILY  . fluticasone (FLONASE) 50 MCG/ACT nasal spray INSTILL 2 SPRAYS IN EACH NOSTRIL EVERY DAY  . indomethacin (INDOCIN) 50 MG capsule Take 1 capsule (50 mg total) by mouth 2 (two) times daily as needed.  . ondansetron (ZOFRAN ODT) 4 MG disintegrating tablet Take 1 tablet (4 mg total) by mouth every 8 (eight) hours as needed.  . ondansetron (ZOFRAN) 4 MG tablet TAKE 1 TABLET BY  MOUTH EVERY 8 HOURS AS NEEDED FOR NAUSEA AND VOMITING  . OSCIMIN SR 0.375 MG 12 hr tablet TAKE 1 TABLET BY MOUTH 2 (TWO) TIMES DAILY.  Marland Kitchen promethazine (PHENERGAN) 25 MG tablet TAKE 1 TABLET BY MOUTH THREE TIMES DAILY AS NEEDED FOR NAUSEA AND VOMITING  . sucralfate (CARAFATE) 1 g tablet Take 1 g by mouth 4 (four) times daily.  . SUMAtriptan (IMITREX) 50 MG tablet Take 1 tab at onset of migraine.  May repeat in 2 hrs, if needed.  Max dose: 2 tabs/day. This is a 90 day prescription.  Marland Kitchen tiZANidine (ZANAFLEX) 2 MG tablet TAKE 1/2 TO 1 TABLET BY MOUTH EVERY 6 HOURS AS NEEDED FOR MUSCLE  SPASMS  . traZODone (DESYREL) 50 MG tablet TAKE 1/2 TO 1 TABLET AT BEDTIME AS NEEDED FOR  SLEEP  . vitamin E 400 UNIT capsule Take 400 Units by mouth daily.  Marland Kitchen albuterol (PROVENTIL HFA;VENTOLIN HFA) 108 (90 Base) MCG/ACT inhaler Inhale 2 puffs into the lungs every 6 (six) hours as needed for wheezing or shortness of breath.  . gabapentin (NEURONTIN) 100 MG capsule Take 1 capsule (100 mg total) by mouth 3 (three) times daily.  . hydrochlorothiazide (MICROZIDE) 12.5 MG capsule Take 1 capsule (12.5 mg total) by mouth daily. Please make yearly appt with Dr. Burt Knack for January for future refills. 1st attempt  . magic mouthwash w/lidocaine SOLN Take 5 mLs by mouth 3 (three) times daily as needed for mouth pain.  . ranitidine (ZANTAC) 300 MG tablet TAKE 1 TABLET AT BEDTIME   No facility-administered encounter medications on file as of 06/30/2018.     Allergies (verified) Iodine-131; Sulfamethoxazole; Amitriptyline; Effexor [venlafaxine]; Iohexol; Lactose intolerance (gi); Lexapro [escitalopram oxalate]; and Iodinated diagnostic agents   History: Past Medical History:  Diagnosis Date  . 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 Allergic state 12/31/2012  . Anxiety   . Asthma   . Barrett esophagus   . Blind loop syndrome   . C. difficile diarrhea   . Cancer ([place])   . Chest pain, atypical 07/14/2011  . Cold sore 07/17/2016  . Colon polyp   . Contact dermatitis 03/23/2012  . Cystocele, midline   . Depression   . Diarrhea   . Diverticulosis   . Endometrial hyperplasia 02/27/2006   BENIGN ENDO BX ON 02/2007  . GERD (gastroesophageal reflux disease)   . Headache 04/16/2015  . Headache   . Headache(784.0) 04/15/2012  . Hematuria 04/27/2012  . Hepatic cyst   . Hiatal hernia   . Hiatal hernia   . Hyperlipidemia, mixed 12/17/2014  . Hypertension   . IBS (irritable bowel syndrome)   . Insomnia   . Leaky heart valve   . Low back pain 08/15/2013  . Osteoporosis 03/2017     T score -2.5. Without significant loss from prior studies  . 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 Paronychia of great toe, left 06/19/2013  . Preventative health care 09/11/2014  . Rectal bleeding 10/10/2011  . Rectocele   . 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 Sinusitis acute 02/03/2012  . Thrush 07/21/2011  . 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 Unspecified hypothyroidism   . Unspecified menopausal and postmenopausal disorder   . Uterine prolapse without mention of vaginal wall prolapse   . Vitamin B12 deficiency   . Vitamin D deficiency 07/17/2016   Past Surgical History:  Procedure Laterality Date  . APPENDECTOMY    . endoscopic hemoclip    . eye surgery Left  lens implant  . HYSTEROSCOPY     POLYP  . PERIPHERAL VASCULAR CATHETERIZATION Left 07/24/2016   Procedure: Renal Angiography;  Surgeon: Conrad Whitley, MD;  Location: [facility];  Service: Cardiovascular;  Laterality: Left;  . UPPER GI ENDOSCOPY    . UTERINE FIBROID SURGERY     Family History  Problem Relation Age of Onset  . Colon cancer Mother   . Diabetes Mother   . Hypertension Mother   . Colon polyps Father   . Parkinson's disease Father   . Colon cancer Maternal Grandmother   . Breast cancer Maternal Grandmother 58  . Diabetes Paternal Grandmother   . Breast cancer Paternal Grandmother 42  . Allergies Sister   . Arthritis Brother        b/l hip replace  . Alcohol abuse Daughter   . Arthritis Son   . Hypertension Son   . Allergies Son   . Osteoporosis Son   . Osteoporosis Son   . Arthritis Son        back disease, DDD  . Allergies Son   . Arthritis Son   . Irritable bowel syndrome Son        RSD  . Hypertension Paternal Grandfather   . Heart disease Paternal Grandfather   . Aneurysm Paternal Grandfather   . Colon cancer Maternal Aunt   . Colon cancer Cousin        X3  . Ovarian cancer Cousin    Social History   Socioeconomic History  . Marital status: Married    Spouse name: Not on file   . Number of children: 4  . Years of education: 57  . Highest education level: High school graduate  Occupational History  . Occupation: Retired  Scientific laboratory technician  . Financial resource strain: Not on file  . Food insecurity:    Worry: Not on file    Inability: Not on file  . Transportation needs:    Medical: Not on file    Non-medical: Not on file  Tobacco Use  . Smoking status: Former Smoker    Last attempt to quit: 08/19/1995    Years since quitting: 22.8  . Smokeless tobacco: Never Used  Substance and Sexual Activity  . Alcohol use: No    Alcohol/week: 0.0 standard drinks  . Drug use: No    Types: Hydrocodone  . Sexual activity: Not Currently    Birth control/protection: Post-menopausal    Comment: 1st intercourse 89 yo-5 partners  Lifestyle  . Physical activity:    Days per week: Not on file    Minutes per session: Not on file  . Stress: Not on file  Relationships  . Social connections:    Talks on phone: Not on file    Gets together: Not on file    Attends religious service: Not on file    Active member of club or organization: Not on file    Attends meetings of clubs or organizations: Not on file    Relationship status: Not on file  Other Topics Concern  . Not on file  Social History Narrative   Lives at home with her husband.   Right-handed.   2 cups caffeine per day.    Tobacco Counseling Counseling given: Not Answered    Activities of Daily Living In your present state of health, do you have any difficulty performing the following activities: 06/30/2018 11/11/2017  Hearing? Y N  Vision? N N  Difficulty concentrating or making decisions? N N  Walking or climbing stairs? N N  Dressing or bathing? N N  Doing errands, shopping? N N  Preparing Food and eating ? N -  Using the Toilet? N -  In the past six months, have you accidently leaked urine? N -  Do you have problems with loss of bowel control? N -  Managing your Medications? N -  Managing your  Finances? N -  Housekeeping or managing your Housekeeping? N -  Some recent data might be hidden     Immunizations and Health Maintenance Immunization History  Administered Date(s) Administered  . Influenza Split 07/29/2011, 06/23/2012  . Influenza, High Dose Seasonal PF 06/26/2017, 06/30/2018  . Influenza,inj,Quad PF,6+ Mos 05/16/2013, 05/22/2014, 08/01/2015  . Td 08/18/1992  . Tdap 09/11/2014   Health Maintenance Due  Topic Date Due  . MAMMOGRAM  12/30/2017    Patient Care Team: Delorse Limber as PCP - General (Family Medicine) Sherren Mocha, MD as PCP - Cardiology (Cardiology) [facility], Minta Balsam, NP as Nurse Practitioner (Nurse Practitioner) Jodi Marble, MD as Consulting Physician (Otolaryngology) Phineas Real, Belinda Block, MD as Consulting Physician (Gynecology) Conrad Darke, MD as Consulting Physician (Vascular Surgery) Warden Fillers, MD as Consulting Physician (Ophthalmology) Marcial Pacas, MD as Consulting Physician (Neurology) Aviva Signs, MD as Referring Physician (Gastroenterology)  Indicate any recent Medical Services you may have received from other than [facility] providers in the past year (date may be approximate).     Assessment:   This is a routine wellness examination for Ashelyn.  Hearing/Vision screen Hearing Screening Comments: Reports hearing loss, hearing test 04/18/2018.  Vision Screening Comments: Last exam 06/18/2018, yearly. Dr. Katy Fitch. H/O cataract surgery.   Dietary issues and exercise activities discussed: Current Exercise Habits: The patient does not participate in regular exercise at present, Exercise limited by: None identified   Diet (meal preparation, eat out, water intake, caffeinated beverages, dairy products, fruits and vegetables): Drinks water. Occasional gingerale.   Breakfast: sausage biscuit; egg; grits; coffee Lunch: salad; chicken fingers Dinner: soup; chili; protein; vegetables.   Goals    . Weight (lb) < 155  lb (70.3 kg)     Lose weight by eating better.       Depression Screen PHQ 2/9 Scores 06/30/2018 11/11/2017 09/29/2017 05/12/2017 03/03/2017 11/04/2016 11/04/2016  PHQ - 2 Score 3 0 2 1 0 3 2  PHQ- 9 Score 11 0 8 3 2  - 9    Fall Risk Fall Risk  06/30/2018 11/11/2017 11/04/2016 07/20/2015  Falls in the past year? 0 No No No     Cognitive Function: MMSE - Mini Mental State Exam 06/30/2018  Orientation to time 5  Orientation to Place 5  Registration 3  Attention/ Calculation 5  Recall 3  Language- name 2 objects 2  Language- repeat 1  Language- follow 3 step command 3  Language- read & follow direction 1  Write a sentence 1  Copy design 1  Total score 30        Screening Tests Health Maintenance  Topic Date Due  . MAMMOGRAM  12/30/2017  . Hepatitis C Screening  09/29/2018 (Originally Jun 16, 1951)  . PNA vac Low Risk Adult (1 of 2 - PCV13) 07/01/2019 (Originally 07/17/2015)  . COLONOSCOPY  01/22/2023  . DTaP/Tdap/Td (2 - Td) 09/11/2024  . TETANUS/TDAP  09/11/2024  . INFLUENZA VACCINE  Completed  . DEXA SCAN  Completed      Plan:    Schedule mammogram  Continue doing brain stimulating activities (puzzles, reading, adult coloring books,  staying active) to keep memory sharp.   I have personally reviewed and noted the following in the patient's chart:   . Medical and social history . Use of alcohol, tobacco or illicit drugs  . Current medications and supplements . Functional ability and status . Nutritional status . Physical activity . Advanced directives . List of other physicians . Hospitalizations, surgeries, and ER visits in previous 12 months . Vitals . Screenings to include cognitive, depression, and falls . Referrals and appointments  In addition, I have reviewed and discussed with patient certain preventive protocols, quality metrics, and best practice recommendations. A written personalized care plan for preventive services as well as general preventive  health recommendations were provided to patient.     Gerilyn Nestle, RN   06/30/2018

## 2018-06-30 ENCOUNTER — Ambulatory Visit (INDEPENDENT_AMBULATORY_CARE_PROVIDER_SITE_OTHER): Payer: Medicare HMO

## 2018-06-30 ENCOUNTER — Other Ambulatory Visit: Payer: Self-pay

## 2018-06-30 ENCOUNTER — Ambulatory Visit (INDEPENDENT_AMBULATORY_CARE_PROVIDER_SITE_OTHER): Payer: Medicare HMO | Admitting: Physician Assistant

## 2018-06-30 ENCOUNTER — Encounter: Payer: Self-pay | Admitting: Physician Assistant

## 2018-06-30 VITALS — BP 150/80 | HR 64 | Temp 98.3°F | Resp 16 | Ht 66.0 in | Wt 162.0 lb

## 2018-06-30 VITALS — BP 148/80 | HR 64 | Temp 98.3°F | Resp 16 | Ht 66.0 in | Wt 162.0 lb

## 2018-06-30 DIAGNOSIS — F341 Dysthymic disorder: Secondary | ICD-10-CM

## 2018-06-30 DIAGNOSIS — Z1239 Encounter for other screening for malignant neoplasm of breast: Secondary | ICD-10-CM

## 2018-06-30 DIAGNOSIS — Z0000 Encounter for general adult medical examination without abnormal findings: Secondary | ICD-10-CM | POA: Diagnosis not present

## 2018-06-30 DIAGNOSIS — J329 Chronic sinusitis, unspecified: Secondary | ICD-10-CM

## 2018-06-30 DIAGNOSIS — Z23 Encounter for immunization: Secondary | ICD-10-CM

## 2018-06-30 DIAGNOSIS — J341 Cyst and mucocele of nose and nasal sinus: Secondary | ICD-10-CM | POA: Diagnosis not present

## 2018-06-30 MED ORDER — SERTRALINE HCL 50 MG PO TABS: 50.0000 mg | ORAL_TABLET | Freq: Every day | ORAL | 3 refills | Status: DC

## 2018-06-30 MED ORDER — ALPRAZOLAM 1 MG PO TABS: 1.0000 mg | ORAL_TABLET | Freq: Two times a day (BID) | ORAL | 0 refills | Status: DC | PRN

## 2018-06-30 MED ORDER — DOXYCYCLINE HYCLATE 100 MG PO CAPS: 100.0000 mg | ORAL_CAPSULE | Freq: Two times a day (BID) | ORAL | 0 refills | Status: DC

## 2018-06-30 NOTE — Progress Notes (Signed)
RN medicare wellness note reviewed. Patient has follow-up with me today.  Leeanne Rio, PA-C

## 2018-06-30 NOTE — Patient Instructions (Addendum)
Schedule mammogram  Continue doing brain stimulating activities (puzzles, reading, adult coloring books, staying active) to keep memory sharp.   Health Maintenance, Female Adopting a healthy lifestyle and getting preventive care can go a long way to promote health and wellness. Talk with your health care provider about what schedule of regular examinations is right for you. This is a good chance for you to check in with your provider about disease prevention and staying healthy. In between checkups, there are plenty of things you can do on your own. Experts have done a lot of research about which lifestyle changes and preventive measures are most likely to keep you healthy. Ask your health care provider for more information. Weight and diet Eat a healthy diet  Be sure to include plenty of vegetables, fruits, low-fat dairy products, and lean protein.  Do not eat a lot of foods high in solid fats, added sugars, or salt.  Get regular exercise. This is one of the most important things you can do for your health. ? Most adults should exercise for at least 150 minutes each week. The exercise should increase your heart rate and make you sweat (moderate-intensity exercise). ? Most adults should also do strengthening exercises at least twice a week. This is in addition to the moderate-intensity exercise.  Maintain a healthy weight  Body mass index (BMI) is a measurement that can be used to identify possible weight problems. It estimates body fat based on height and weight. Your health care provider can help determine your BMI and help you achieve or maintain a healthy weight.  For females 41 years of age and older: ? A BMI below 18.5 is considered underweight. ? A BMI of 18.5 to 24.9 is normal. ? A BMI of 25 to 29.9 is considered overweight. ? A BMI of 30 and above is considered obese.  Watch levels of cholesterol and blood lipids  You should start having your blood tested for lipids and  cholesterol at 68 years of age, then have this test every 5 years.  You may need to have your cholesterol levels checked more often if: ? Your lipid or cholesterol levels are high. ? You are older than 68 years of age. ? You are at high risk for heart disease.  Cancer screening Lung Cancer  Lung cancer screening is recommended for adults 68-55 years old who are at high risk for lung cancer because of a history of smoking.  A yearly low-dose CT scan of the lungs is recommended for people who: ? Currently smoke. ? Have quit within the past 15 years. ? Have at least a 30-pack-year history of smoking. A pack year is smoking an average of one pack of cigarettes a day for 1 year.  Yearly screening should continue until it has been 15 years since you quit.  Yearly screening should stop if you develop a health problem that would prevent you from having lung cancer treatment.  Breast Cancer  Practice breast self-awareness. This means understanding how your breasts normally appear and feel.  It also means doing regular breast self-exams. Let your health care provider know about any changes, no matter how small.  If you are in your 68s or 30s, you should have a clinical breast exam (CBE) by a health care provider every 1-3 years as part of a regular health exam.  If you are 68 or older, have a CBE every year. Also consider having a breast X-ray (mammogram) every 68 year.  If you have a  family history of breast cancer, talk to your health care provider about genetic screening.  If you are at high risk for breast cancer, talk to your health care provider about having an MRI and a mammogram every year.  Breast cancer gene (BRCA) assessment is recommended for women who have family members with BRCA-related cancers. BRCA-related cancers include: ? Breast. ? Ovarian. ? Tubal. ? Peritoneal cancers.  Results of the assessment will determine the need for genetic counseling and BRCA1 and BRCA2  testing.  Cervical Cancer Your health care provider may recommend that you be screened regularly for cancer of the pelvic organs (ovaries, uterus, and vagina). This screening involves a pelvic examination, including checking for microscopic changes to the surface of your cervix (Pap test). You may be encouraged to have this screening done every 3 years, beginning at age 68.  For women ages 68-65, health care providers may recommend pelvic exams and Pap testing every 3 years, or they may recommend the Pap and pelvic exam, combined with testing for human papilloma virus (HPV), every 5 years. Some types of HPV increase your risk of cervical cancer. Testing for HPV may also be done on women of any age with unclear Pap test results.  Other health care providers may not recommend any screening for nonpregnant women who are considered low risk for pelvic cancer and who do not have symptoms. Ask your health care provider if a screening pelvic exam is right for you.  If you have had past treatment for cervical cancer or a condition that could lead to cancer, you need Pap tests and screening for cancer for at least 20 years after your treatment. If Pap tests have been discontinued, your risk factors (such as having a new sexual partner) need to be reassessed to determine if screening should resume. Some women have medical problems that increase the chance of getting cervical cancer. In these cases, your health care provider may recommend more frequent screening and Pap tests.  Colorectal Cancer  This type of cancer can be detected and often prevented.  Routine colorectal cancer screening usually begins at 68 years of age and continues through 68 years of age.  Your health care provider may recommend screening at an earlier age if you have risk factors for colon cancer.  Your health care provider may also recommend using home test kits to check for hidden blood in the stool.  A small camera at the end of a  tube can be used to examine your colon directly (sigmoidoscopy or colonoscopy). This is done to check for the earliest forms of colorectal cancer.  Routine screening usually begins at age 68.  Direct examination of the colon should be repeated every 5-10 years through 68 years of age. However, you may need to be screened more often if early forms of precancerous polyps or small growths are found.  Skin Cancer  Check your skin from head to toe regularly.  Tell your health care provider about any new moles or changes in moles, especially if there is a change in a mole's shape or color.  Also tell your health care provider if you have a mole that is larger than the size of a pencil eraser.  Always use sunscreen. Apply sunscreen liberally and repeatedly throughout the day.  Protect yourself by wearing long sleeves, pants, a wide-brimmed hat, and sunglasses whenever you are outside.  Heart disease, diabetes, and high blood pressure  High blood pressure causes heart disease and increases the risk of  stroke. High blood pressure is more likely to develop in: ? People who have blood pressure in the high end of the normal range (130-139/85-89 mm Hg). ? People who are overweight or obese. ? People who are African American.  If you are 32-58 years of age, have your blood pressure checked every 3-5 years. If you are 63 years of age or older, have your blood pressure checked every year. You should have your blood pressure measured twice-once when you are at a hospital or clinic, and once when you are not at a hospital or clinic. Record the average of the two measurements. To check your blood pressure when you are not at a hospital or clinic, you can use: ? An automated blood pressure machine at a pharmacy. ? A home blood pressure monitor.  If you are between 72 years and 56 years old, ask your health care provider if you should take aspirin to prevent strokes.  Have regular diabetes screenings. This  involves taking a blood sample to check your fasting blood sugar level. ? If you are at a normal weight and have a low risk for diabetes, have this test once every three years after 68 years of age. ? If you are overweight and have a high risk for diabetes, consider being tested at a younger age or more often. Preventing infection Hepatitis B  If you have a higher risk for hepatitis B, you should be screened for this virus. You are considered at high risk for hepatitis B if: ? You were born in a country where hepatitis B is common. Ask your health care provider which countries are considered high risk. ? Your parents were born in a high-risk country, and you have not been immunized against hepatitis B (hepatitis B vaccine). ? You have HIV or AIDS. ? You use needles to inject street drugs. ? You live with someone who has hepatitis B. ? You have had sex with someone who has hepatitis B. ? You get hemodialysis treatment. ? You take certain medicines for conditions, including cancer, organ transplantation, and autoimmune conditions.  Hepatitis C  Blood testing is recommended for: ? Everyone born from [birth year] through 1966. ? Anyone with known risk factors for hepatitis C.  Sexually transmitted infections (STIs)  You should be screened for sexually transmitted infections (STIs) including gonorrhea and chlamydia if: ? You are sexually active and are younger than 68 years of age. ? You are older than 68 years of age and your health care provider tells you that you are at risk for this type of infection. ? Your sexual activity has changed since you were last screened and you are at an increased risk for chlamydia or gonorrhea. Ask your health care provider if you are at risk.  If you do not have HIV, but are at risk, it may be recommended that you take a prescription medicine daily to prevent HIV infection. This is called pre-exposure prophylaxis (PrEP). You are considered at risk if: ? You are  sexually active and do not regularly use condoms or know the HIV status of your partner(s). ? You take drugs by injection. ? You are sexually active with a partner who has HIV.  Talk with your health care provider about whether you are at high risk of being infected with HIV. If you choose to begin PrEP, you should first be tested for HIV. You should then be tested every 3 months for as long as you are taking PrEP. Pregnancy  If  you are premenopausal and you may become pregnant, ask your health care provider about preconception counseling.  If you may become pregnant, take 400 to 800 micrograms (mcg) of folic acid every day.  If you want to prevent pregnancy, talk to your health care provider about birth control (contraception). Osteoporosis and menopause  Osteoporosis is a disease in which the bones lose minerals and strength with aging. This can result in serious bone fractures. Your risk for osteoporosis can be identified using a bone density scan.  If you are 29 years of age or older, or if you are at risk for osteoporosis and fractures, ask your health care provider if you should be screened.  Ask your health care provider whether you should take a calcium or vitamin D supplement to lower your risk for osteoporosis.  Menopause may have certain physical symptoms and risks.  Hormone replacement therapy may reduce some of these symptoms and risks. Talk to your health care provider about whether hormone replacement therapy is right for you. Follow these instructions at home:  Schedule regular health, dental, and eye exams.  Stay current with your immunizations.  Do not use any tobacco products including cigarettes, chewing tobacco, or electronic cigarettes.  If you are pregnant, do not drink alcohol.  If you are breastfeeding, limit how much and how often you drink alcohol.  Limit alcohol intake to no more than 1 drink per day for nonpregnant women. One drink equals 12 ounces of  beer, 5 ounces of wine, or 1 ounces of hard liquor.  Do not use street drugs.  Do not share needles.  Ask your health care provider for help if you need support or information about quitting drugs.  Tell your health care provider if you often feel depressed.  Tell your health care provider if you have ever been abused or do not feel safe at home. This information is not intended to replace advice given to you by your health care provider. Make sure you discuss any questions you have with your health care provider. Document Released: 02/17/2011 Document Revised: 01/10/2016 Document Reviewed: 05/08/2015 [employer]vier Interactive Patient Education  [employer].

## 2018-06-30 NOTE — Patient Instructions (Signed)
Please stop the Fluoxetine and start the Sertraline daily.  Xanax as directed when needed for more acute anxiety.   Please take antibiotic as directed.  Increase fluid intake.  Use Saline nasal spray.  Take a daily multivitamin.  Place a humidifier in the bedroom. I am setting you up with a new ENT specialist giving chronic symptoms and mucous cysts noted on recent MRI with Neurology.  Sinusitis Sinusitis is redness, soreness, and swelling (inflammation) of the paranasal sinuses. Paranasal sinuses are air pockets within the bones of your face (beneath the eyes, the middle of the forehead, or above the eyes). In healthy paranasal sinuses, mucus is able to drain out, and air is able to circulate through them by way of your nose. However, when your paranasal sinuses are inflamed, mucus and air can become trapped. This can allow bacteria and other germs to grow and cause infection. Sinusitis can develop quickly and last only a short time (acute) or continue over a long period (chronic). Sinusitis that lasts for more than 12 weeks is considered chronic.  CAUSES  Causes of sinusitis include:  Allergies.  Structural abnormalities, such as displacement of the cartilage that separates your nostrils (deviated septum), which can decrease the air flow through your nose and sinuses and affect sinus drainage.  Functional abnormalities, such as when the small hairs (cilia) that line your sinuses and help remove mucus do not work properly or are not present. SYMPTOMS  Symptoms of acute and chronic sinusitis are the same. The primary symptoms are pain and pressure around the affected sinuses. Other symptoms include:  Upper toothache.  Earache.  Headache.  Bad breath.  Decreased sense of smell and taste.  A cough, which worsens when you are lying flat.  Fatigue.  Fever.  Thick drainage from your nose, which often is green and may contain pus (purulent).  Swelling and warmth over the affected  sinuses. DIAGNOSIS  Your caregiver will perform a physical exam. During the exam, your caregiver may:  Look in your nose for signs of abnormal growths in your nostrils (nasal polyps).  Tap over the affected sinus to check for signs of infection.  View the inside of your sinuses (endoscopy) with a special imaging device with a light attached (endoscope), which is inserted into your sinuses. If your caregiver suspects that you have chronic sinusitis, one or more of the following tests may be recommended:  Allergy tests.  Nasal culture A sample of mucus is taken from your nose and sent to a lab and screened for bacteria.  Nasal cytology A sample of mucus is taken from your nose and examined by your caregiver to determine if your sinusitis is related to an allergy. TREATMENT  Most cases of acute sinusitis are related to a viral infection and will resolve on their own within 10 days. Sometimes medicines are prescribed to help relieve symptoms (pain medicine, decongestants, nasal steroid sprays, or saline sprays).  However, for sinusitis related to a bacterial infection, your caregiver will prescribe antibiotic medicines. These are medicines that will help kill the bacteria causing the infection.  Rarely, sinusitis is caused by a fungal infection. In theses cases, your caregiver will prescribe antifungal medicine. For some cases of chronic sinusitis, surgery is needed. Generally, these are cases in which sinusitis recurs more than 3 times per year, despite other treatments. HOME CARE INSTRUCTIONS   Drink plenty of water. Water helps thin the mucus so your sinuses can drain more easily.  Use a humidifier.  Inhale  steam 3 to 4 times a day (for example, sit in the bathroom with the shower running).  Apply a warm, moist washcloth to your face 3 to 4 times a day, or as directed by your caregiver.  Use saline nasal sprays to help moisten and clean your sinuses.  Take over-the-counter or  prescription medicines for pain, discomfort, or fever only as directed by your caregiver. SEEK IMMEDIATE MEDICAL CARE IF:  You have increasing pain or severe headaches.  You have nausea, vomiting, or drowsiness.  You have swelling around your face.  You have vision problems.  You have a stiff neck.  You have difficulty breathing. MAKE SURE YOU:   Understand these instructions.  Will watch your condition.  Will get help right away if you are not doing well or get worse. Document Released: 08/04/2005 Document Revised: 10/27/2011 Document Reviewed: 08/19/2011 [facility] Patient Information 2014 [place], [place].

## 2018-06-30 NOTE — Progress Notes (Signed)
Patient presents to clinic today for follow-up of anxiety and depression. Patient notes dealing with multiple stressors in her family which are causing a deterioration in her mood. Does not feel her Fluoxetine is helping at all with her symptoms. Is having to take her BZD daily due to acute anxiety. Denies SI/HI.  Patient also notes continued issue with her sinuses. Recently had MRI performed by Neurology for unrelated issue and was known to have a larger mucous retention cyst in her L maxillary sinus where she has . Has been dealing with chronic sinus pressure and headaches and wonders if this could be related. Has previously seen ENT some time ago but states she would want to go to a different practice. Notes 2 weeks of increased sinus pressure and pain with thick rhinorrhea and nasal tenderness. Also notes cough. Denies fever, chills. Does note fatigue.   Past Medical History:  Diagnosis Date  . 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 Allergic state 12/31/2012  . Anxiety   . Asthma   . Barrett esophagus   . Blind loop syndrome   . C. difficile diarrhea   . Cancer ([place])   . Chest pain, atypical 07/14/2011  . Cold sore 07/17/2016  . Colon polyp   . Contact dermatitis 03/23/2012  . Cystocele, midline   . Depression   . Diarrhea   . Diverticulosis   . Endometrial hyperplasia 02/27/2006   BENIGN ENDO BX ON 02/2007  . GERD (gastroesophageal reflux disease)   . Headache 04/16/2015  . Headache   . Headache(784.0) 04/15/2012  . Hematuria 04/27/2012  . Hepatic cyst   . Hiatal hernia   . Hiatal hernia   . Hyperlipidemia, mixed 12/17/2014  . Hypertension   . IBS (irritable bowel syndrome)   . Insomnia   . Leaky heart valve   . Low back pain 08/15/2013  . Osteoporosis 03/2017   T score -2.5. Without significant loss from prior studies  . 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 Paronychia of great toe, left 06/19/2013  . Preventative health care 09/11/2014  . Rectal bleeding  10/10/2011  . Rectocele   . 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 Sinusitis acute 02/03/2012  . Thrush 07/21/2011  . 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 Unspecified hypothyroidism   . Unspecified menopausal and postmenopausal disorder   . Uterine prolapse without mention of vaginal wall prolapse   . Vitamin B12 deficiency   . Vitamin D deficiency 07/17/2016    Current Outpatient Medications on File Prior to Visit  Medication Sig Dispense Refill  . albuterol (PROVENTIL HFA;VENTOLIN HFA) 108 (90 Base) MCG/ACT inhaler Inhale 2 puffs into the lungs every 6 (six) hours as needed for wheezing or shortness of breath. 1 Inhaler 0  . albuterol (PROVENTIL) (2.5 MG/3ML) 0.083% nebulizer solution Take 3 mLs (2.5 mg total) by nebulization every 6 (six) hours as needed for wheezing or shortness of breath. 150 mL 3  . aspirin EC 81 MG tablet Take 1 tablet (81 mg total) by mouth daily.    Marland Kitchen azelastine (ASTELIN) 0.1 % nasal spray Place 1 spray into both nostrils 2 (two) times daily as needed for rhinitis. Use in each nostril as directed    . carvedilol (COREG) 12.5 MG tablet Take 12.5 mg by mouth daily.    . Cholecalciferol (VITAMIN D-3) 5000 units TABS Take 1 tablet by mouth daily. 30 tablet 0  . diltiazem (CARTIA XT) 180 MG 24 hr capsule Take 1 capsule (180 mg total)  by mouth daily. 90 capsule 1  . fluticasone (FLONASE) 50 MCG/ACT nasal spray INSTILL 2 SPRAYS IN EACH NOSTRIL EVERY DAY 16 g 0  . gabapentin (NEURONTIN) 100 MG capsule Take 1 capsule (100 mg total) by mouth 3 (three) times daily. 270 capsule 3  . hydrochlorothiazide (MICROZIDE) 12.5 MG capsule Take 1 capsule (12.5 mg total) by mouth daily. Please make yearly appt with Dr. Burt Knack for January for future refills. 1st attempt 30 capsule 0  . indomethacin (INDOCIN) 50 MG capsule Take 1 capsule (50 mg total) by mouth 2 (two) times daily as needed. 60 capsule 3  . magic mouthwash w/lidocaine SOLN Take 5 mLs by mouth 3 (three) times daily as needed for  mouth pain. 100 mL 0  . ondansetron (ZOFRAN ODT) 4 MG disintegrating tablet Take 1 tablet (4 mg total) by mouth every 8 (eight) hours as needed. 20 tablet 6  . ondansetron (ZOFRAN) 4 MG tablet TAKE 1 TABLET BY MOUTH EVERY 8 HOURS AS NEEDED FOR NAUSEA AND VOMITING 20 tablet 0  . OSCIMIN SR 0.375 MG 12 hr tablet TAKE 1 TABLET BY MOUTH 2 (TWO) TIMES DAILY. 30 tablet 0  . promethazine (PHENERGAN) 25 MG tablet TAKE 1 TABLET BY MOUTH THREE TIMES DAILY AS NEEDED FOR NAUSEA AND VOMITING 30 tablet 0  . ranitidine (ZANTAC) 300 MG tablet TAKE 1 TABLET AT BEDTIME 90 tablet 1  . sucralfate (CARAFATE) 1 g tablet Take 1 g by mouth 4 (four) times daily.    . SUMAtriptan (IMITREX) 50 MG tablet Take 1 tab at onset of migraine.  May repeat in 2 hrs, if needed.  Max dose: 2 tabs/day. This is a 90 day prescription. 45 tablet 3  . tiZANidine (ZANAFLEX) 2 MG tablet TAKE 1/2 TO 1 TABLET BY MOUTH EVERY 6 HOURS AS NEEDED FOR MUSCLE SPASMS 30 tablet 0  . traZODone (DESYREL) 50 MG tablet TAKE 1/2 TO 1 TABLET AT BEDTIME AS NEEDED FOR  SLEEP 90 tablet 0   No current facility-administered medications on file prior to visit.     Allergies  Allergen Reactions  . Iodine-131 Other (See Comments)    Other reaction(s): Dizziness (intolerance)  . Sulfamethoxazole Shortness Of Breath    chest tightness  . Amitriptyline Anxiety and Other (See Comments)    Depression, insomnia  . Effexor [Venlafaxine]     palpitations  . Iohexol Other (See Comments)    Passed out  . Lactose Intolerance (Gi) Diarrhea and Nausea And Vomiting  . Lexapro [Escitalopram Oxalate]     nausea  . Iodinated Diagnostic Agents     Pt states at Dr.Grapeys office 15 years ago blacked out for 2 hours during injection for IVP. Pt says she had dye recently with no problems     Family History  Problem Relation Age of Onset  . Colon cancer Mother   . Diabetes Mother   . Hypertension Mother   . Colon polyps Father   . Parkinson's disease Father   . Colon  cancer Maternal Grandmother   . Breast cancer Maternal Grandmother 84  . Diabetes Paternal Grandmother   . Breast cancer Paternal Grandmother 78  . Allergies Sister   . Arthritis Brother        b/l hip replace  . Alcohol abuse Daughter   . Arthritis Son   . Hypertension Son   . Allergies Son   . Osteoporosis Son   . Osteoporosis Son   . Arthritis Son        back  disease, DDD  . Allergies Son   . Arthritis Son   . Irritable bowel syndrome Son        RSD  . Hypertension Paternal Grandfather   . Heart disease Paternal Grandfather   . Aneurysm Paternal Grandfather   . Colon cancer Maternal Aunt   . Colon cancer Cousin        X3  . Ovarian cancer Cousin     Social History   Socioeconomic History  . Marital status: Married    Spouse name: Not on file  . Number of children: 4  . Years of education: 29  . Highest education level: High school graduate  Occupational History  . Occupation: Retired  Scientific laboratory technician  . Financial resource strain: Not on file  . Food insecurity:    Worry: Not on file    Inability: Not on file  . Transportation needs:    Medical: Not on file    Non-medical: Not on file  Tobacco Use  . Smoking status: Former Smoker    Last attempt to quit: 08/19/1995    Years since quitting: 22.8  . Smokeless tobacco: Never Used  Substance and Sexual Activity  . Alcohol use: No    Alcohol/week: 0.0 standard drinks  . Drug use: No    Types: Hydrocodone  . Sexual activity: Not Currently    Birth control/protection: Post-menopausal    Comment: 1st intercourse 46 yo-5 partners  Lifestyle  . Physical activity:    Days per week: Not on file    Minutes per session: Not on file  . Stress: Not on file  Relationships  . Social connections:    Talks on phone: Not on file    Gets together: Not on file    Attends religious service: Not on file    Active member of club or organization: Not on file    Attends meetings of clubs or organizations: Not on file     Relationship status: Not on file  Other Topics Concern  . Not on file  Social History Narrative   Lives at home with her husband.   Right-handed.   2 cups caffeine per day.   Review of Systems - See HPI.  All other ROS are negative.  BP (!) 150/80   Pulse 64   Temp 98.3 F (36.8 C) (Oral)   Resp 16   Ht 5\' 6"  (1.676 m)   Wt 162 lb (73.5 kg)   SpO2 98%   BMI 26.15 kg/m   Physical Exam  Constitutional: She appears well-developed and well-nourished.  HENT:  Head: Normocephalic and atraumatic.  Right Ear: Tympanic membrane and external ear normal.  Left Ear: Tympanic membrane and external ear normal.  Nose: Mucosal edema and rhinorrhea present. Right sinus exhibits frontal sinus tenderness. Left sinus exhibits maxillary sinus tenderness and frontal sinus tenderness.  Mouth/Throat: Oropharynx is clear and moist.  Eyes: Conjunctivae and EOM are normal.  Neck: Neck supple.  Cardiovascular: Normal rate, regular rhythm and normal heart sounds.  Pulmonary/Chest: Effort normal.  Lymphadenopathy:    She has no cervical adenopathy.  Vitals reviewed.   Recent Results (from the past 2160 hour(s))  TSH     Status: None   Collection Time: 04/07/18  9:14 AM  Result Value Ref Range   TSH 1.350 0.450 - 4.500 uIU/mL   Assessment/Plan: 1. ANXIETY DEPRESSION Stop Fluoxetine. Start Sertraline 50 mg daily. Follow-up 1 month for reassessment. - sertraline (ZOLOFT) 50 MG tablet; Take 1 tablet (50 mg total)  by mouth daily.  Dispense: 30 tablet; Refill: 3  2. Chronic sinusitis, unspecified location With acute flare. Rx Doxycycline.  Increase fluids.  Rest.  Saline nasal spray.  Probiotic.  Mucinex as directed.  Humidifier in bedroom.  Call or return to clinic if symptoms are not improving.  - doxycycline (VIBRAMYCIN) 100 MG capsule; Take 1 capsule (100 mg total) by mouth 2 (two) times daily.  Dispense: 20 capsule; Refill: 0 - Ambulatory referral to ENT  3. Mucous retention cyst of maxillary  sinus - Ambulatory referral to ENT    Leeanne Rio, PA-C

## 2018-07-06 ENCOUNTER — Ambulatory Visit
Admission: RE | Admit: 2018-07-06 | Discharge: 2018-07-06 | Disposition: A | Discharge status: Home / Self Care | Payer: Medicare HMO | Source: Ambulatory Visit | Attending: Physician Assistant | Admitting: Physician Assistant

## 2018-07-06 DIAGNOSIS — Z1239 Encounter for other screening for malignant neoplasm of breast: Secondary | ICD-10-CM

## 2018-07-06 DIAGNOSIS — Z1231 Encounter for screening mammogram for malignant neoplasm of breast: Secondary | ICD-10-CM | POA: Diagnosis not present

## 2018-07-07 DIAGNOSIS — G500 Trigeminal neuralgia: Secondary | ICD-10-CM | POA: Diagnosis not present

## 2018-07-07 DIAGNOSIS — J3489 Other specified disorders of nose and nasal sinuses: Secondary | ICD-10-CM | POA: Diagnosis not present

## 2018-07-08 ENCOUNTER — Encounter: Payer: Self-pay | Admitting: Emergency Medicine

## 2018-07-19 ENCOUNTER — Other Ambulatory Visit: Payer: Self-pay | Admitting: Cardiovascular Disease

## 2018-07-22 ENCOUNTER — Other Ambulatory Visit: Payer: Self-pay | Admitting: Emergency Medicine

## 2018-07-22 NOTE — Telephone Encounter (Signed)
Last rx 02/26/18 #30 Please advise

## 2018-07-24 DIAGNOSIS — J45909 Unspecified asthma, uncomplicated: Secondary | ICD-10-CM | POA: Diagnosis not present

## 2018-07-26 ENCOUNTER — Other Ambulatory Visit: Payer: Self-pay | Admitting: Physician Assistant

## 2018-07-26 MED FILL — OSCIMIN SR 0.375 MG TABLET: 0.375 | 15 days supply | Qty: 30 | Fill #0

## 2018-08-06 ENCOUNTER — Encounter: Payer: Self-pay | Admitting: Physician Assistant

## 2018-08-07 ENCOUNTER — Encounter: Payer: Self-pay | Admitting: Physician Assistant

## 2018-08-12 MED ORDER — SERTRALINE HCL 100 MG PO TABS: 100.0000 mg | ORAL_TABLET | Freq: Every day | ORAL | 3 refills | Status: DC

## 2018-08-12 NOTE — Addendum Note (Signed)
Addended by: Brunetta Jeans on: 08/12/2018 08:09 AM   Modules accepted: Orders

## 2018-08-17 ENCOUNTER — Other Ambulatory Visit: Payer: Self-pay | Admitting: Cardiovascular Disease

## 2018-08-19 MED ORDER — DILTIAZEM HCL ER COATED BEADS 180 MG PO CP24: 180.0000 mg | ORAL_CAPSULE | Freq: Every day | ORAL | 3 refills | Status: DC

## 2018-08-20 ENCOUNTER — Ambulatory Visit: Payer: Self-pay

## 2018-08-20 ENCOUNTER — Other Ambulatory Visit: Payer: Self-pay | Admitting: Physician Assistant

## 2018-08-20 NOTE — Telephone Encounter (Signed)
Last OV 06/30/18 Alprazolam last filled 06/30/18 #30 with 0

## 2018-08-24 DIAGNOSIS — J45909 Unspecified asthma, uncomplicated: Secondary | ICD-10-CM | POA: Diagnosis not present

## 2018-09-01 ENCOUNTER — Encounter: Payer: Self-pay | Admitting: Physician Assistant

## 2018-09-01 DIAGNOSIS — G44209 Tension-type headache, unspecified, not intractable: Secondary | ICD-10-CM

## 2018-09-01 MED ORDER — TIZANIDINE HCL 2 MG PO TABS: ORAL_TABLET | ORAL | 0 refills | Status: DC

## 2018-09-23 ENCOUNTER — Ambulatory Visit (INDEPENDENT_AMBULATORY_CARE_PROVIDER_SITE_OTHER): Payer: Medicare HMO | Admitting: Cardiovascular Disease

## 2018-09-23 ENCOUNTER — Encounter: Payer: Self-pay | Admitting: Cardiovascular Disease

## 2018-09-23 VITALS — BP 128/72 | HR 71 | Ht 66.0 in | Wt 158.0 lb

## 2018-09-23 DIAGNOSIS — I10 Essential (primary) hypertension: Secondary | ICD-10-CM | POA: Diagnosis not present

## 2018-09-23 DIAGNOSIS — I700 Atherosclerosis of aorta: Secondary | ICD-10-CM | POA: Diagnosis not present

## 2018-09-23 DIAGNOSIS — I071 Rheumatic tricuspid insufficiency: Secondary | ICD-10-CM | POA: Diagnosis not present

## 2018-09-23 DIAGNOSIS — E782 Mixed hyperlipidemia: Secondary | ICD-10-CM

## 2018-09-23 NOTE — Patient Instructions (Signed)
Medication Instructions:  Your provider recommends that you continue on your current medications as directed. Please refer to the Current Medication list given to you today.    Labwork: Your provider recommends that you return for FASTING lab work.  Testing/Procedures: No new orders  Follow-Up: Your provider wants you to follow-up in: 1 year with Dr. Burt Knack. You will receive a reminder letter in the mail two months in advance. If you don't receive a letter, please call our office to schedule the follow-up appointment.

## 2018-09-23 NOTE — Progress Notes (Signed)
Cardiology Office Note:    Date:  09/23/2018   ID:  Megan [name], DOB 1951/02/26, MRN [id number]  PCP:  Brunetta Jeans, PA-C  Cardiologist:  Sherren Mocha, MD  Electrophysiologist:  None   Referring MD: Brunetta Jeans, PA-C   Chief Complaint  Patient presents with  . Follow-up    tricuspid regurgitation    History of Present Illness:    Megan [name] is a 68 y.o. female with a hx of chest pain, heart palpitations, and mild to moderate tricuspid regurgitation.  She has undergone serial echo studies, most recently about 1 year ago when she was found to have normal LV and RV function, mild tricuspid regurgitation, and normal pulmonary pressures.  The patient is here alone today.  She has had a lot of stress over the past year.  She is taking care of a 29-month-old great grandchild.  She is had a lot of other issues in her family.  She has had occasional sharp and fleeting left-sided chest pains that also radiate to the left hand.  These have been nonexertional and only last for a few seconds.  They have always occurred at rest.  She otherwise is doing quite well with no shortness of breath, edema, heart palpitations, orthopnea, or PND.  She is compliant with her medications.  Her blood pressure has been well controlled since her last visit.  Past Medical History:  Diagnosis Date  . 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 Allergic state 12/31/2012  . Anxiety   . Asthma   . Barrett esophagus   . Blind loop syndrome   . C. difficile diarrhea   . Cancer ([place])   . Chest pain, atypical 07/14/2011  . Cold sore 07/17/2016  . Colon polyp   . Contact dermatitis 03/23/2012  . Cystocele, midline   . Depression   . Diarrhea   . Diverticulosis   . Endometrial hyperplasia 02/27/2006   BENIGN ENDO BX ON 02/2007  . GERD (gastroesophageal reflux disease)   . Headache 04/16/2015  . Headache   . Headache(784.0) 04/15/2012  . Hematuria 04/27/2012  .  Hepatic cyst   . Hiatal hernia   . Hiatal hernia   . Hyperlipidemia, mixed 12/17/2014  . Hypertension   . IBS (irritable bowel syndrome)   . Insomnia   . Leaky heart valve   . Low back pain 08/15/2013  . Osteoporosis 03/2017   T score -2.5. Without significant loss from prior studies  . 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 Paronychia of great toe, left 06/19/2013  . Preventative health care 09/11/2014  . Rectal bleeding 10/10/2011  . Rectocele   . 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 Sinusitis acute 02/03/2012  . Thrush 07/21/2011  . 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 Unspecified hypothyroidism   . Unspecified menopausal and postmenopausal disorder   . Uterine prolapse without mention of vaginal wall prolapse   . Vitamin B12 deficiency   . Vitamin D deficiency 07/17/2016    Past Surgical History:  Procedure Laterality Date  . APPENDECTOMY    . endoscopic hemoclip    . eye surgery Left    lens implant  . HYSTEROSCOPY     POLYP  . PERIPHERAL VASCULAR CATHETERIZATION Left 07/24/2016   Procedure: Renal Angiography;  Surgeon: Conrad Califon, MD;  Location: [facility];  Service: Cardiovascular;  Laterality: Left;  . UPPER GI ENDOSCOPY    . UTERINE FIBROID SURGERY      Current  Medications: Current Meds  Medication Sig  . ALPRAZolam (XANAX) 1 MG tablet TAKE 1 TABLET(1 MG) BY MOUTH TWICE DAILY AS NEEDED FOR ANXIETY  . aspirin EC 81 MG tablet Take 1 tablet (81 mg total) by mouth daily.  Marland Kitchen azelastine (ASTELIN) 0.1 % nasal spray Place 1 spray into both nostrils 2 (two) times daily as needed for rhinitis. Use in each nostril as directed  . Biotin 1000 MCG tablet Take 1,000 mcg by mouth 3 (three) times daily.  . carvedilol (COREG) 12.5 MG tablet TAKE 1 TABLET EVERY DAY  . Cholecalciferol (VITAMIN D-3) 5000 units TABS Take 1 tablet by mouth daily.  Marland Kitchen diltiazem (CARTIA XT) 180 MG 24 hr capsule Take 1 capsule (180 mg total) by mouth daily.  . dorzolamide-timolol (COSOPT) 22.3-6.8 MG/ML  ophthalmic solution   . fluticasone (FLONASE) 50 MCG/ACT nasal spray INSTILL 2 SPRAYS IN EACH NOSTRIL EVERY DAY  . gabapentin (NEURONTIN) 100 MG capsule Take 1 capsule (100 mg total) by mouth 3 (three) times daily.  . hyoscyamine (LEVBID) 0.375 MG 12 hr tablet TAKE 1 TABLET BY MOUTH 2 TIMES DAILY.  . indomethacin (INDOCIN) 50 MG capsule Take 1 capsule (50 mg total) by mouth 2 (two) times daily as needed.  . ondansetron (ZOFRAN ODT) 4 MG disintegrating tablet Take 1 tablet (4 mg total) by mouth every 8 (eight) hours as needed.  . ondansetron (ZOFRAN) 4 MG tablet TAKE 1 TABLET BY MOUTH EVERY 8 HOURS AS NEEDED FOR NAUSEA AND VOMITING  . promethazine (PHENERGAN) 25 MG tablet TAKE 1 TABLET BY MOUTH THREE TIMES DAILY AS NEEDED FOR NAUSEA AND VOMITING  . sertraline (ZOLOFT) 100 MG tablet Take 1 tablet (100 mg total) by mouth daily.  Marland Kitchen tiZANidine (ZANAFLEX) 2 MG tablet TAKE 1/2 TO 1 TABLET BY MOUTH EVERY 6 HOURS AS NEEDED FOR MUSCLE SPASMS  . traZODone (DESYREL) 50 MG tablet TAKE 1/2 TO 1 TABLET AT BEDTIME AS NEEDED FOR  SLEEP  . vitamin E 400 UNIT capsule Take 400 Units by mouth daily.     Allergies:   Iodine-131; Sulfamethoxazole; Amitriptyline; Effexor [venlafaxine]; Iohexol; Lactose intolerance (gi); Lexapro [escitalopram oxalate]; and Iodinated diagnostic agents   Social History   Socioeconomic History  . Marital status: Married    Spouse name: Not on file  . Number of children: 4  . Years of education: 101  . Highest education level: High school graduate  Occupational History  . Occupation: Retired  Scientific laboratory technician  . Financial resource strain: Not on file  . Food insecurity:    Worry: Not on file    Inability: Not on file  . Transportation needs:    Medical: Not on file    Non-medical: Not on file  Tobacco Use  . Smoking status: Former Smoker    Last attempt to quit: 08/19/1995    Years since quitting: 23.1  . Smokeless tobacco: Never Used  Substance and Sexual Activity  . Alcohol  use: No    Alcohol/week: 0.0 standard drinks  . Drug use: No    Types: Hydrocodone  . Sexual activity: Not Currently    Birth control/protection: Post-menopausal    Comment: 1st intercourse 50 yo-5 partners  Lifestyle  . Physical activity:    Days per week: Not on file    Minutes per session: Not on file  . Stress: Not on file  Relationships  . Social connections:    Talks on phone: Not on file    Gets together: Not on file  Attends religious service: Not on file    Active member of club or organization: Not on file    Attends meetings of clubs or organizations: Not on file    Relationship status: Not on file  Other Topics Concern  . Not on file  Social History Narrative   Lives at home with her husband.   Right-handed.   2 cups caffeine per day.     Family History: The patient's family history includes Alcohol abuse in her daughter; Allergies in her sister, son, and son; Aneurysm in her paternal grandfather; Arthritis in her brother, son, son, and son; Breast cancer (age of onset: 14) in her paternal grandmother; Breast cancer (age of onset: 35) in her maternal grandmother; Colon cancer in her cousin, maternal aunt, maternal grandmother, and mother; Colon polyps in her father; Diabetes in her mother and paternal grandmother; Heart disease in her paternal grandfather; Hypertension in her mother, paternal grandfather, and son; Irritable bowel syndrome in her son; Osteoporosis in her son and son; Ovarian cancer in her cousin; Parkinson's disease in her father.  ROS:   Please see the history of present illness.    Positive for hearing loss, depression, constipation, anxiety.  All other systems reviewed and are negative.  EKGs/Labs/Other Studies Reviewed:    The following studies were reviewed today: 2D echocardiogram 08/17/2017: Study Conclusions  - Left ventricle: The cavity size was normal. There was mild focal   basal hypertrophy of the septum. Systolic function was  normal.   Wall motion was normal; there were no regional wall motion   abnormalities. Left ventricular diastolic function parameters   were normal. - Aortic valve: Transvalvular velocity was within the normal range.   There was no stenosis. There was no regurgitation. - Mitral valve: Transvalvular velocity was within the normal range.   There was no evidence for stenosis. There was trivial   regurgitation. - Right ventricle: The cavity size was normal. Wall thickness was   normal. Systolic function was normal. - Atrial septum: No defect or patent foramen ovale was identified   by color flow Doppler. - Tricuspid valve: There was mild regurgitation. - Pulmonary arteries: Systolic pressure was within the normal   range. PA peak pressure: 34 mm Hg (S).  MRI Brain 04-15-2018: FINDINGS: Brain: There is no evidence of acute infarct, intracranial hemorrhage, mass, midline shift, or extra-axial fluid collection. Mild cerebral atrophy is unchanged. Patchy cerebral white matter T2 hyperintensities are unchanged and nonspecific but compatible with chronic small vessel ischemic disease, mild-to-moderate for age. No abnormal enhancement is identified.  Vascular: Major intracranial vascular flow voids are preserved.  Skull and upper cervical spine: Unremarkable bone marrow signal.  Sinuses/Orbits: Left cataract extraction. Small left maxillary sinus mucous retention cyst. Clear mastoid air cells.  Other: None.  IMPRESSION: 1. No acute intracranial abnormality. 2. Unchanged cerebral atrophy and chronic small vessel ischemic Disease.  EKG:  EKG is not ordered today.   Recent Labs: 03/30/2018: ALT 16; BUN 6; Creatinine, Ser 0.80; Hemoglobin 14.6; Platelets 309; Potassium 4.1; Sodium 141 04/07/2018: TSH 1.350  Recent Lipid Panel    Component Value Date/Time   CHOL 218 (H) 07/17/2016 1513   TRIG 115.0 07/17/2016 1513   HDL 49.30 07/17/2016 1513   CHOLHDL 4 07/17/2016 1513   VLDL  23.0 07/17/2016 1513   LDLCALC 146 (H) 07/17/2016 1513   LDLDIRECT 165.0 06/30/2011 1153    Physical Exam:    VS:  BP 128/72   Pulse 71   Ht 5\' 6"  (  1.676 m)   Wt 158 lb (71.7 kg)   SpO2 96%   BMI 25.50 kg/m     Wt Readings from Last 3 Encounters:  09/23/18 158 lb (71.7 kg)  06/30/18 162 lb (73.5 kg)  06/30/18 162 lb (73.5 kg)     GEN:  Well nourished, well developed in no acute distress HEENT: Normal NECK: No JVD; No carotid bruits LYMPHATICS: No lymphadenopathy CARDIAC: RRR, 1/6 systolic ejection murmur at the right upper sternal border RESPIRATORY:  Clear to auscultation without rales, wheezing or rhonchi  ABDOMEN: Soft, non-tender, non-distended MUSCULOSKELETAL:  No edema; No deformity  SKIN: Warm and dry NEUROLOGIC:  Alert and oriented x 3 PSYCHIATRIC:  Normal affect   ASSESSMENT:    1. Tricuspid valve insufficiency, unspecified etiology   2. Essential hypertension   3. Hyperlipidemia, mixed   4. Aortic atherosclerosis (HCC)    PLAN:    In order of problems listed above:  1. Careful exam reveals no murmur of tricuspid valve insufficiency today.  She seems to be doing quite well with no symptoms of shortness of breath, orthopnea, PND, or leg swelling.  We will continue with clinical follow-up. 2. Blood pressure is well controlled on a combination of carvedilol and long-acting diltiazem. 3. Continue aspirin.  Consider statin drug.  Check lipids.  Overall the patient appears stable from a cardiac perspective.  Her chest pains are highly atypical and will continue to follow this conservatively.  She has not had a lipid panel since 2017 and this should be updated in the presence of incidental aortic atherosclerosis seen on a CT scan last year.   Medication Adjustments/Labs and Tests Ordered: Current medicines are reviewed at length with the patient today.  Concerns regarding medicines are outlined above.  Orders Placed This Encounter  Procedures  . Lipid panel    No orders of the defined types were placed in this encounter.   Patient Instructions  Medication Instructions:  Your provider recommends that you continue on your current medications as directed. Please refer to the Current Medication list given to you today.    Labwork: Your provider recommends that you return for FASTING lab work.  Testing/Procedures: No new orders  Follow-Up: Your provider wants you to follow-up in: 1 year with Dr. Burt Knack. You will receive a reminder letter in the mail two months in advance. If you don't receive a letter, please call our office to schedule the follow-up appointment.      Signed, Sherren Mocha, MD  09/23/2018 1:24 PM    [facility]

## 2018-09-26 ENCOUNTER — Other Ambulatory Visit: Payer: Self-pay | Admitting: Physician Assistant

## 2018-09-27 ENCOUNTER — Other Ambulatory Visit: Payer: Medicare HMO | Admitting: *Deleted

## 2018-09-27 DIAGNOSIS — I071 Rheumatic tricuspid insufficiency: Secondary | ICD-10-CM | POA: Diagnosis not present

## 2018-09-27 DIAGNOSIS — E782 Mixed hyperlipidemia: Secondary | ICD-10-CM

## 2018-09-27 DIAGNOSIS — I10 Essential (primary) hypertension: Secondary | ICD-10-CM | POA: Diagnosis not present

## 2018-09-27 LAB — LIPID PANEL
Chol/HDL Ratio: 4.5 ratio — ABNORMAL HIGH (ref 0.0–4.4)
Cholesterol, Total: 209 mg/dL — ABNORMAL HIGH (ref 100–199)
HDL: 46 mg/dL (ref >39)
LDL Calculated: 136 mg/dL — ABNORMAL HIGH (ref 0–99)
Triglycerides: 137 mg/dL (ref 0–149)
VLDL Cholesterol Cal: 27 mg/dL (ref 5–40)

## 2018-09-27 NOTE — Telephone Encounter (Signed)
Last refill:08/23/18 #30, 0 Last OV:06/30/18

## 2018-10-01 ENCOUNTER — Telehealth: Payer: Self-pay | Admitting: Cardiovascular Disease

## 2018-10-01 DIAGNOSIS — E782 Mixed hyperlipidemia: Secondary | ICD-10-CM

## 2018-10-01 NOTE — Telephone Encounter (Signed)
Per pt not on any cholesterol meds and LDL is 136  Pt aware labs not reviewed by Dr Burt Knack at this time  Will forward to Dr Burt Knack for review and recommendations ./cy

## 2018-10-01 NOTE — Telephone Encounter (Signed)
New Message   PT is calling for labs results,  Please call

## 2018-10-07 ENCOUNTER — Telehealth: Payer: Self-pay | Admitting: Physician Assistant

## 2018-10-07 NOTE — Telephone Encounter (Signed)
Last rx for Promethazine was 12/17/17 #30  Please advise

## 2018-10-08 ENCOUNTER — Telehealth: Payer: Medicare HMO | Admitting: Family

## 2018-10-08 DIAGNOSIS — J019 Acute sinusitis, unspecified: Secondary | ICD-10-CM | POA: Diagnosis not present

## 2018-10-08 DIAGNOSIS — B9689 Other specified bacterial agents as the cause of diseases classified elsewhere: Secondary | ICD-10-CM

## 2018-10-08 MED ORDER — AMOXICILLIN-POT CLAVULANATE 875-125 MG PO TABS: 1.0000 | ORAL_TABLET | Freq: Two times a day (BID) | ORAL | 0 refills | Status: DC

## 2018-10-08 NOTE — Progress Notes (Signed)

## 2018-10-10 NOTE — Telephone Encounter (Signed)
Cholesterol is too high. Recommend a trial of crestor 10 mg daily if she is willing. Repeat labs 3 months. thanks

## 2018-10-11 DIAGNOSIS — Z961 Presence of intraocular lens: Secondary | ICD-10-CM | POA: Diagnosis not present

## 2018-10-11 DIAGNOSIS — H35373 Puckering of macula, bilateral: Secondary | ICD-10-CM | POA: Diagnosis not present

## 2018-10-11 DIAGNOSIS — H40033 Anatomical narrow angle, bilateral: Secondary | ICD-10-CM | POA: Diagnosis not present

## 2018-10-11 DIAGNOSIS — H40023 Open angle with borderline findings, high risk, bilateral: Secondary | ICD-10-CM | POA: Diagnosis not present

## 2018-10-11 DIAGNOSIS — H04123 Dry eye syndrome of bilateral lacrimal glands: Secondary | ICD-10-CM | POA: Diagnosis not present

## 2018-10-11 NOTE — Telephone Encounter (Signed)
Pt husband called in and stated that pt is not feeling well and is having a bad time with her stomach this week.  He setup an appt with for her next week.  He stated they could not come in this week but would like to know if enough phenergan could be called in to make it to her appt?

## 2018-10-11 NOTE — Telephone Encounter (Signed)
Pt stated that she will call her GI specialist to request Phenergan and have GI provider to take over prescription.

## 2018-10-11 NOTE — Telephone Encounter (Signed)
Left message to call back  

## 2018-10-11 NOTE — Telephone Encounter (Signed)
If she is not feeling well she really needs assessment in office. Phenergan should be coming from her GI specialist. I have refilled previously for her with notation that her GI provider needs to be prescribing this and following her for her chronic issues.

## 2018-10-13 MED ORDER — ROSUVASTATIN CALCIUM 10 MG PO TABS: 10.0000 mg | ORAL_TABLET | Freq: Every day | ORAL | 11 refills | Status: DC

## 2018-10-13 NOTE — Telephone Encounter (Signed)
-----   Message from Sherren Mocha, MD sent at 10/12/2018 10:25 PM EST ----- Reviewed. Recommend crestor 10 mg daily

## 2018-10-13 NOTE — Telephone Encounter (Signed)
Reviewed results with patient who verbalized understanding.    Instructed patient to START CRESTOR 10 mg daily. FLP and LFTs scheduled June 1. She was grateful for call and agrees with treatment plan.

## 2018-10-18 ENCOUNTER — Other Ambulatory Visit: Payer: Self-pay | Admitting: Physician Assistant

## 2018-10-18 ENCOUNTER — Ambulatory Visit (INDEPENDENT_AMBULATORY_CARE_PROVIDER_SITE_OTHER): Payer: Medicare HMO | Admitting: Physician Assistant

## 2018-10-18 ENCOUNTER — Other Ambulatory Visit: Payer: Self-pay

## 2018-10-18 ENCOUNTER — Encounter: Payer: Self-pay | Admitting: Physician Assistant

## 2018-10-18 VITALS — BP 130/72 | HR 64 | Temp 98.2°F | Resp 16 | Ht 66.0 in | Wt 156.0 lb

## 2018-10-18 DIAGNOSIS — G8929 Other chronic pain: Secondary | ICD-10-CM | POA: Diagnosis not present

## 2018-10-18 DIAGNOSIS — J019 Acute sinusitis, unspecified: Principal | ICD-10-CM

## 2018-10-18 DIAGNOSIS — M549 Dorsalgia, unspecified: Secondary | ICD-10-CM | POA: Diagnosis not present

## 2018-10-18 DIAGNOSIS — B9689 Other specified bacterial agents as the cause of diseases classified elsewhere: Secondary | ICD-10-CM

## 2018-10-18 DIAGNOSIS — F341 Dysthymic disorder: Secondary | ICD-10-CM

## 2018-10-18 DIAGNOSIS — R350 Frequency of micturition: Secondary | ICD-10-CM

## 2018-10-18 DIAGNOSIS — Z79899 Other long term (current) drug therapy: Secondary | ICD-10-CM | POA: Diagnosis not present

## 2018-10-18 LAB — POCT URINALYSIS DIPSTICK
Bilirubin, UA: NEGATIVE
Blood, UA: NEGATIVE
Glucose, UA: NEGATIVE
Ketones, UA: NEGATIVE
Leukocytes, UA: NEGATIVE
NITRITE UA: NEGATIVE
Protein, UA: NEGATIVE
Spec Grav, UA: 1.01 (ref 1.010–1.025)
Urobilinogen, UA: 0.2 E.U./dL
pH, UA: 6 (ref 5.0–8.0)

## 2018-10-18 MED ORDER — PREDNISONE 10 MG PO TABS: ORAL_TABLET | ORAL | 0 refills | Status: AC

## 2018-10-18 MED ORDER — DOXYCYCLINE HYCLATE 100 MG PO CAPS: 100.0000 mg | ORAL_CAPSULE | Freq: Two times a day (BID) | ORAL | 0 refills | Status: DC

## 2018-10-18 MED ORDER — FLUTICASONE PROPIONATE 50 MCG/ACT NA SUSP: 2.0000 | Freq: Every day | NASAL | 0 refills | Status: DC

## 2018-10-18 MED ORDER — BENZONATATE 100 MG PO CAPS: 100.0000 mg | ORAL_CAPSULE | Freq: Three times a day (TID) | ORAL | 0 refills | Status: DC | PRN

## 2018-10-18 NOTE — Patient Instructions (Signed)
Please take antibiotic as directed.  Increase fluid intake.  Use Saline nasal spray.  Take a daily multivitamin. Take the Tessalon and Prednisone as directed. Restart Flonase.  Place a humidifier in the bedroom.  Please call or return clinic if symptoms are not improving.  Urine is clear today. Will see how symptoms do once we stop the coughing.   Sinusitis Sinusitis is redness, soreness, and swelling (inflammation) of the paranasal sinuses. Paranasal sinuses are air pockets within the bones of your face (beneath the eyes, the middle of the forehead, or above the eyes). In healthy paranasal sinuses, mucus is able to drain out, and air is able to circulate through them by way of your nose. However, when your paranasal sinuses are inflamed, mucus and air can become trapped. This can allow bacteria and other germs to grow and cause infection. Sinusitis can develop quickly and last only a short time (acute) or continue over a long period (chronic). Sinusitis that lasts for more than 12 weeks is considered chronic.  CAUSES  Causes of sinusitis include:  Allergies.  Structural abnormalities, such as displacement of the cartilage that separates your nostrils (deviated septum), which can decrease the air flow through your nose and sinuses and affect sinus drainage.  Functional abnormalities, such as when the small hairs (cilia) that line your sinuses and help remove mucus do not work properly or are not present. SYMPTOMS  Symptoms of acute and chronic sinusitis are the same. The primary symptoms are pain and pressure around the affected sinuses. Other symptoms include:  Upper toothache.  Earache.  Headache.  Bad breath.  Decreased sense of smell and taste.  A cough, which worsens when you are lying flat.  Fatigue.  Fever.  Thick drainage from your nose, which often is green and may contain pus (purulent).  Swelling and warmth over the affected sinuses. DIAGNOSIS  Your caregiver will  perform a physical exam. During the exam, your caregiver may:  Look in your nose for signs of abnormal growths in your nostrils (nasal polyps).  Tap over the affected sinus to check for signs of infection.  View the inside of your sinuses (endoscopy) with a special imaging device with a light attached (endoscope), which is inserted into your sinuses. If your caregiver suspects that you have chronic sinusitis, one or more of the following tests may be recommended:  Allergy tests.  Nasal culture A sample of mucus is taken from your nose and sent to a lab and screened for bacteria.  Nasal cytology A sample of mucus is taken from your nose and examined by your caregiver to determine if your sinusitis is related to an allergy. TREATMENT  Most cases of acute sinusitis are related to a viral infection and will resolve on their own within 10 days. Sometimes medicines are prescribed to help relieve symptoms (pain medicine, decongestants, nasal steroid sprays, or saline sprays).  However, for sinusitis related to a bacterial infection, your caregiver will prescribe antibiotic medicines. These are medicines that will help kill the bacteria causing the infection.  Rarely, sinusitis is caused by a fungal infection. In theses cases, your caregiver will prescribe antifungal medicine. For some cases of chronic sinusitis, surgery is needed. Generally, these are cases in which sinusitis recurs more than 3 times per year, despite other treatments. HOME CARE INSTRUCTIONS   Drink plenty of water. Water helps thin the mucus so your sinuses can drain more easily.  Use a humidifier.  Inhale steam 3 to 4 times a day (  for example, sit in the bathroom with the shower running).  Apply a warm, moist washcloth to your face 3 to 4 times a day, or as directed by your caregiver.  Use saline nasal sprays to help moisten and clean your sinuses.  Take over-the-counter or prescription medicines for pain, discomfort, or  fever only as directed by your caregiver. SEEK IMMEDIATE MEDICAL CARE IF:  You have increasing pain or severe headaches.  You have nausea, vomiting, or drowsiness.  You have swelling around your face.  You have vision problems.  You have a stiff neck.  You have difficulty breathing. MAKE SURE YOU:   Understand these instructions.  Will watch your condition.  Will get help right away if you are not doing well or get worse. Document Released: 08/04/2005 Document Revised: 10/27/2011 Document Reviewed: 08/19/2011 [facility] Patient Information 2014 [place], [place].

## 2018-10-18 NOTE — Progress Notes (Signed)
Patient presents to clinic today c/o 2.5 weeks of chest congestion, nasal congestion and sinus congestion with significant PND that is very thick. Sometimes causing her to get choked or having to spit up the drainage. Did an e-visit 1.5 weeks ago and was given a course of Augmentin which she took without resolution of symptoms. Now with chest congestion and chest tightness. Denies chest pain or SOBOE.   Has notes some bladder spasms worse at night. Has been trying to keep hydrated and keep a well-balanced diet. Denies dysuria or urinary urgency for frequency.  Mucinex, ES Tylenol and Afrin (for 2 days only). Has not been using Flonase.   Past Medical History:  Diagnosis Date  . 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 Allergic state 12/31/2012  . Anxiety   . Asthma   . Barrett esophagus   . Blind loop syndrome   . C. difficile diarrhea   . Cancer ([place])   . Chest pain, atypical 07/14/2011  . Cold sore 07/17/2016  . Colon polyp   . Contact dermatitis 03/23/2012  . Cystocele, midline   . Depression   . Diarrhea   . Diverticulosis   . Endometrial hyperplasia 02/27/2006   BENIGN ENDO BX ON 02/2007  . GERD (gastroesophageal reflux disease)   . Headache 04/16/2015  . Headache   . Headache(784.0) 04/15/2012  . Hematuria 04/27/2012  . Hepatic cyst   . Hiatal hernia   . Hiatal hernia   . Hyperlipidemia, mixed 12/17/2014  . Hypertension   . IBS (irritable bowel syndrome)   . Insomnia   . Leaky heart valve   . Low back pain 08/15/2013  . Osteoporosis 03/2017   T score -2.5. Without significant loss from prior studies  . 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 Paronychia of great toe, left 06/19/2013  . Preventative health care 09/11/2014  . Rectal bleeding 10/10/2011  . Rectocele   . 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 Sinusitis acute 02/03/2012  . Thrush 07/21/2011  . 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 Unspecified hypothyroidism   . Unspecified menopausal and  postmenopausal disorder   . Uterine prolapse without mention of vaginal wall prolapse   . Vitamin B12 deficiency   . Vitamin D deficiency 07/17/2016    Current Outpatient Medications on File Prior to Visit  Medication Sig Dispense Refill  . ALPRAZolam (XANAX) 1 MG tablet TAKE 1 TABLET(1 MG) BY MOUTH TWICE DAILY AS NEEDED FOR ANXIETY 30 tablet 0  . aspirin EC 81 MG tablet Take 1 tablet (81 mg total) by mouth daily.    . Biotin 1000 MCG tablet Take 1,000 mcg by mouth 3 (three) times daily.    . carvedilol (COREG) 12.5 MG tablet TAKE 1 TABLET EVERY DAY 90 tablet 0  . Cholecalciferol (VITAMIN D-3) 5000 units TABS Take 1 tablet by mouth daily. 30 tablet 0  . diltiazem (CARTIA XT) 180 MG 24 hr capsule Take 1 capsule (180 mg total) by mouth daily. 90 capsule 3  . dorzolamide-timolol (COSOPT) 22.3-6.8 MG/ML ophthalmic solution     . gabapentin (NEURONTIN) 100 MG capsule Take 1 capsule (100 mg total) by mouth 3 (three) times daily. 270 capsule 3  . hyoscyamine (LEVBID) 0.375 MG 12 hr tablet TAKE 1 TABLET BY MOUTH 2 TIMES DAILY. 30 tablet 0  . indomethacin (INDOCIN) 50 MG capsule Take 1 capsule (50 mg total) by mouth 2 (two) times daily as needed. 60 capsule 3  . ondansetron (ZOFRAN ODT) 4 MG disintegrating tablet  Take 1 tablet (4 mg total) by mouth every 8 (eight) hours as needed. 20 tablet 6  . ondansetron (ZOFRAN) 4 MG tablet TAKE 1 TABLET BY MOUTH EVERY 8 HOURS AS NEEDED FOR NAUSEA AND VOMITING 20 tablet 0  . promethazine (PHENERGAN) 25 MG tablet TAKE 1 TABLET BY MOUTH THREE TIMES DAILY AS NEEDED FOR NAUSEA AND VOMITING 30 tablet 0  . rosuvastatin (CRESTOR) 10 MG tablet Take 1 tablet (10 mg total) by mouth daily. 30 tablet 11  . sertraline (ZOLOFT) 100 MG tablet Take 1 tablet (100 mg total) by mouth daily. 30 tablet 3  . tiZANidine (ZANAFLEX) 2 MG tablet TAKE 1/2 TO 1 TABLET BY MOUTH EVERY 6 HOURS AS NEEDED FOR MUSCLE SPASMS 30 tablet 0  . traZODone (DESYREL) 50 MG tablet TAKE 1/2 TO 1 TABLET AT  BEDTIME AS NEEDED FOR  SLEEP 90 tablet 0  . vitamin E 400 UNIT capsule Take 400 Units by mouth daily.    Marland Kitchen azelastine (ASTELIN) 0.1 % nasal spray Place 1 spray into both nostrils 2 (two) times daily as needed for rhinitis. Use in each nostril as directed    . fluticasone (FLONASE) 50 MCG/ACT nasal spray INSTILL 2 SPRAYS IN EACH NOSTRIL EVERY DAY (Patient not taking: Reported on 10/18/2018) 16 g 0   No current facility-administered medications on file prior to visit.     Allergies  Allergen Reactions  . Iodine-131 Other (See Comments)    Other reaction(s): Dizziness (intolerance)  . Sulfamethoxazole Shortness Of Breath    chest tightness  . Amitriptyline Anxiety and Other (See Comments)    Depression, insomnia  . Effexor [Venlafaxine]     palpitations  . Iohexol Other (See Comments)    Passed out  . Lactose Intolerance (Gi) Diarrhea and Nausea And Vomiting  . Lexapro [Escitalopram Oxalate]     nausea  . Iodinated Diagnostic Agents     Pt states at Dr.Grapeys office 15 years ago blacked out for 2 hours during injection for IVP. Pt says she had dye recently with no problems     Family History  Problem Relation Age of Onset  . Colon cancer Mother   . Diabetes Mother   . Hypertension Mother   . Colon polyps Father   . Parkinson's disease Father   . Colon cancer Maternal Grandmother   . Breast cancer Maternal Grandmother 42  . Diabetes Paternal Grandmother   . Breast cancer Paternal Grandmother 65  . Allergies Sister   . Arthritis Brother        b/l hip replace  . Alcohol abuse Daughter   . Arthritis Son   . Hypertension Son   . Allergies Son   . Osteoporosis Son   . Osteoporosis Son   . Arthritis Son        back disease, DDD  . Allergies Son   . Arthritis Son   . Irritable bowel syndrome Son        RSD  . Hypertension Paternal Grandfather   . Heart disease Paternal Grandfather   . Aneurysm Paternal Grandfather   . Colon cancer Maternal Aunt   . Colon cancer Cousin          X3  . Ovarian cancer Cousin     Social History   Socioeconomic History  . Marital status: Married    Spouse name: Not on file  . Number of children: 4  . Years of education: 38  . Highest education level: High school graduate  Occupational History  .  Occupation: Retired  Scientific laboratory technician  . Financial resource strain: Not on file  . Food insecurity:    Worry: Not on file    Inability: Not on file  . Transportation needs:    Medical: Not on file    Non-medical: Not on file  Tobacco Use  . Smoking status: Former Smoker    Last attempt to quit: 08/19/1995    Years since quitting: 23.1  . Smokeless tobacco: Never Used  Substance and Sexual Activity  . Alcohol use: No    Alcohol/week: 0.0 standard drinks  . Drug use: No    Types: Hydrocodone  . Sexual activity: Not Currently    Birth control/protection: Post-menopausal    Comment: 1st intercourse 74 yo-5 partners  Lifestyle  . Physical activity:    Days per week: Not on file    Minutes per session: Not on file  . Stress: Not on file  Relationships  . Social connections:    Talks on phone: Not on file    Gets together: Not on file    Attends religious service: Not on file    Active member of club or organization: Not on file    Attends meetings of clubs or organizations: Not on file    Relationship status: Not on file  Other Topics Concern  . Not on file  Social History Narrative   Lives at home with her husband.   Right-handed.   2 cups caffeine per day.   Review of Systems - See HPI.  All other ROS are negative.  BP 130/72   Pulse 64   Temp 98.2 F (36.8 C) (Oral)   Resp 16   Ht 5\' 6"  (1.676 m)   Wt 156 lb (70.8 kg)   SpO2 98%   BMI 25.18 kg/m   Physical Exam Constitutional:      Appearance: Normal appearance.  HENT:     Head: Normocephalic and atraumatic.     Right Ear: Tympanic membrane normal.     Left Ear: Tympanic membrane normal.     Nose: Congestion present.     Right Sinus: Maxillary  sinus tenderness present.     Left Sinus: Maxillary sinus tenderness present.     Mouth/Throat:     Mouth: Mucous membranes are moist.     Pharynx: Oropharynx is clear.  Eyes:     Conjunctiva/sclera: Conjunctivae normal.  Neck:     Musculoskeletal: Neck supple.  Cardiovascular:     Rate and Rhythm: Normal rate and regular rhythm.     Heart sounds: Normal heart sounds.  Pulmonary:     Effort: Pulmonary effort is normal.     Breath sounds: Normal breath sounds.  Abdominal:     General: Bowel sounds are normal.     Palpations: Abdomen is soft.  Lymphadenopathy:     Cervical: No cervical adenopathy.  Neurological:     Mental Status: She is alert.     Recent Results (from the past 2160 hour(s))  Lipid panel     Status: Abnormal   Collection Time: 09/27/18  9:57 AM  Result Value Ref Range   Cholesterol, Total 209 (H) 100 - 199 mg/dL   Triglycerides 137 0 - 149 mg/dL   HDL 46 >39 mg/dL   VLDL Cholesterol Cal 27 5 - 40 mg/dL   LDL Calculated 136 (H) 0 - 99 mg/dL   Chol/HDL Ratio 4.5 (H) 0.0 - 4.4 ratio    Comment:  T. Chol/HDL Ratio                                             Men  Women                               1/2 Avg.Risk  3.4    3.3                                   Avg.Risk  5.0    4.4                                2X Avg.Risk  9.6    7.1                                3X Avg.Risk 23.4   11.0     Assessment/Plan: 1. ANXIETY DEPRESSION Due for UDS. CSC up-to-date. Will obtain today. - Pain Mgmt, Profile 8 w/Conf, U  2. Urinary frequency Noted recently. No other symptoms. Urine dip negative. Seems related to coughing spells. Will monitor to see if this is residual once she is over infection. - POCT urinalysis dipstick  3. Acute bacterial sinusitis Rx Doxycycline.  Increase fluids.  Rest.  Saline nasal spray.  Probiotic.  Mucinex as directed.  Humidifier in bedroom. Prednisone taper -- 9 day given. Restart Flonase (mecdication  refilled).  Call or return to clinic if symptoms are not improving.  - doxycycline (VIBRAMYCIN) 100 MG capsule; Take 1 capsule (100 mg total) by mouth 2 (two) times daily.  Dispense: 20 capsule; Refill: 0 - predniSONE (DELTASONE) 10 MG tablet; Take 3 tablets (30 mg total) by mouth daily with breakfast for 3 days, THEN 2 tablets (20 mg total) daily with breakfast for 3 days, THEN 1 tablet (10 mg total) daily with breakfast for 3 days.  Dispense: 18 tablet; Refill: 0 - fluticasone (FLONASE) 50 MCG/ACT nasal spray; Place 2 sprays into both nostrils daily.  Dispense: 16 g; Refill: 0 - benzonatate (TESSALON) 100 MG capsule; Take 1 capsule (100 mg total) by mouth 3 (three) times daily as needed for cough.  Dispense: 30 capsule; Refill: 0   Leeanne Rio, PA-C

## 2018-10-20 ENCOUNTER — Ambulatory Visit: Payer: Self-pay | Admitting: Physician Assistant

## 2018-10-20 LAB — PAIN MGMT, PROFILE 8 W/CONF, U
6 Acetylmorphine: NEGATIVE ng/mL (ref <10)
Alcohol Metabolites: NEGATIVE ng/mL (ref <500)
Alphahydroxyalprazolam: 47 ng/mL — ABNORMAL HIGH (ref <25)
Alphahydroxymidazolam: NEGATIVE ng/mL (ref <50)
Alphahydroxytriazolam: NEGATIVE ng/mL (ref <50)
Aminoclonazepam: NEGATIVE ng/mL (ref <25)
Amphetamines: NEGATIVE ng/mL (ref <500)
Benzodiazepines: POSITIVE ng/mL — AB (ref <100)
Buprenorphine, Urine: NEGATIVE ng/mL (ref <5)
Cocaine Metabolite: NEGATIVE ng/mL (ref <150)
Creatinine: 17.9 mg/dL — ABNORMAL LOW
Hydroxyethylflurazepam: NEGATIVE ng/mL (ref <50)
Lorazepam: NEGATIVE ng/mL (ref <50)
MDMA: NEGATIVE ng/mL (ref <500)
Marijuana Metabolite: NEGATIVE ng/mL (ref <20)
Nordiazepam: NEGATIVE ng/mL (ref <50)
Opiates: NEGATIVE ng/mL (ref <100)
Oxazepam: NEGATIVE ng/mL (ref <50)
Oxidant: NEGATIVE ug/mL (ref <200)
Oxycodone: NEGATIVE ng/mL (ref <100)
Specific Gravity: 1.002 — ABNORMAL LOW (ref >=1.0)
Temazepam: NEGATIVE ng/mL (ref <50)
pH: 6.59 (ref 4.5–9.0)

## 2018-10-22 ENCOUNTER — Encounter: Payer: Self-pay | Admitting: Physician Assistant

## 2018-10-22 MED ORDER — FLUCONAZOLE 150 MG PO TABS: 150.0000 mg | ORAL_TABLET | Freq: Once | ORAL | 0 refills | Status: AC

## 2018-10-27 ENCOUNTER — Other Ambulatory Visit: Payer: Self-pay | Admitting: Physician Assistant

## 2018-10-27 NOTE — Telephone Encounter (Signed)
Xanax last rx 09/28/18 #30 CSC: 01/14/18 UDS: 10/18/18

## 2018-11-01 ENCOUNTER — Other Ambulatory Visit: Payer: Self-pay | Admitting: Physician Assistant

## 2018-11-01 ENCOUNTER — Encounter: Payer: Self-pay | Admitting: Physician Assistant

## 2018-11-01 ENCOUNTER — Other Ambulatory Visit: Payer: Self-pay | Admitting: Cardiovascular Disease

## 2018-11-08 ENCOUNTER — Other Ambulatory Visit: Payer: Self-pay | Admitting: Physician Assistant

## 2018-11-19 ENCOUNTER — Other Ambulatory Visit: Payer: Self-pay | Admitting: Physician Assistant

## 2018-11-19 DIAGNOSIS — G44209 Tension-type headache, unspecified, not intractable: Secondary | ICD-10-CM

## 2018-12-07 ENCOUNTER — Telehealth: Payer: Medicare HMO | Admitting: Nurse Practitioner

## 2018-12-07 DIAGNOSIS — J0101 Acute recurrent maxillary sinusitis: Secondary | ICD-10-CM | POA: Diagnosis not present

## 2018-12-07 MED ORDER — AMOXICILLIN-POT CLAVULANATE 875-125 MG PO TABS: 1.0000 | ORAL_TABLET | Freq: Two times a day (BID) | ORAL | 0 refills | Status: DC

## 2018-12-07 NOTE — Progress Notes (Signed)

## 2018-12-14 ENCOUNTER — Other Ambulatory Visit: Payer: Self-pay | Admitting: Neurology

## 2018-12-23 ENCOUNTER — Other Ambulatory Visit: Payer: Self-pay | Admitting: Physician Assistant

## 2018-12-23 NOTE — Telephone Encounter (Signed)
Last Filled: 10/27/2018 #30, 1 Last OV: 10/18/2018

## 2018-12-25 NOTE — Progress Notes (Signed)
Greater than 5 minutes, yet less than 10 minutes of time have been spent researching, coordinating, and implementing care for this patient today.  Thank you for the details you included in the comment boxes. Those details are very helpful in determining the best course of treatment for you and help us to provide the best care.  

## 2018-12-27 DIAGNOSIS — H0015 Chalazion left lower eyelid: Secondary | ICD-10-CM | POA: Diagnosis not present

## 2018-12-27 DIAGNOSIS — H0102A Squamous blepharitis right eye, upper and lower eyelids: Secondary | ICD-10-CM | POA: Diagnosis not present

## 2018-12-27 DIAGNOSIS — H0102B Squamous blepharitis left eye, upper and lower eyelids: Secondary | ICD-10-CM | POA: Diagnosis not present

## 2019-01-07 ENCOUNTER — Emergency Department (HOSPITAL_COMMUNITY): Payer: Medicare HMO

## 2019-01-07 ENCOUNTER — Encounter (HOSPITAL_COMMUNITY): Payer: Self-pay | Admitting: Emergency Medicine

## 2019-01-07 ENCOUNTER — Other Ambulatory Visit: Payer: Self-pay

## 2019-01-07 ENCOUNTER — Emergency Department (HOSPITAL_COMMUNITY)
Admission: EM | Admit: 2019-01-07 | Discharge: 2019-01-07 | Disposition: A | Discharge status: Home / Self Care | Payer: Medicare HMO | Attending: Emergency Medicine | Admitting: Emergency Medicine

## 2019-01-07 DIAGNOSIS — S6292XA Unspecified fracture of left wrist and hand, initial encounter for closed fracture: Secondary | ICD-10-CM | POA: Insufficient documentation

## 2019-01-07 DIAGNOSIS — S62102A Fracture of unspecified carpal bone, left wrist, initial encounter for closed fracture: Secondary | ICD-10-CM

## 2019-01-07 DIAGNOSIS — Z87891 Personal history of nicotine dependence: Secondary | ICD-10-CM | POA: Insufficient documentation

## 2019-01-07 DIAGNOSIS — Z79899 Other long term (current) drug therapy: Secondary | ICD-10-CM | POA: Insufficient documentation

## 2019-01-07 DIAGNOSIS — Y999 Unspecified external cause status: Secondary | ICD-10-CM | POA: Diagnosis not present

## 2019-01-07 DIAGNOSIS — S6992XA Unspecified injury of left wrist, hand and finger(s), initial encounter: Secondary | ICD-10-CM | POA: Diagnosis present

## 2019-01-07 DIAGNOSIS — S52502A Unspecified fracture of the lower end of left radius, initial encounter for closed fracture: Secondary | ICD-10-CM | POA: Diagnosis not present

## 2019-01-07 DIAGNOSIS — Y939 Activity, unspecified: Secondary | ICD-10-CM | POA: Insufficient documentation

## 2019-01-07 DIAGNOSIS — Y929 Unspecified place or not applicable: Secondary | ICD-10-CM | POA: Diagnosis not present

## 2019-01-07 DIAGNOSIS — W010XXA Fall on same level from slipping, tripping and stumbling without subsequent striking against object, initial encounter: Secondary | ICD-10-CM | POA: Insufficient documentation

## 2019-01-07 DIAGNOSIS — S52612A Displaced fracture of left ulna styloid process, initial encounter for closed fracture: Secondary | ICD-10-CM | POA: Diagnosis not present

## 2019-01-07 DIAGNOSIS — S52572A Other intraarticular fracture of lower end of left radius, initial encounter for closed fracture: Secondary | ICD-10-CM | POA: Diagnosis not present

## 2019-01-07 MED ORDER — OXYCODONE-ACETAMINOPHEN 5-325 MG PO TABS
1.0000 | ORAL_TABLET | ORAL | Status: DC | PRN
  Administered 2019-01-07: 1 via ORAL
  Filled 2019-01-07: qty 1

## 2019-01-07 MED ORDER — HYDROCODONE-ACETAMINOPHEN 5-325 MG PO TABS: 1.0000 | ORAL_TABLET | Freq: Four times a day (QID) | ORAL | 0 refills | Status: DC | PRN

## 2019-01-07 NOTE — Discharge Instructions (Signed)
Please contact Dr. Brennan Bailey office on Tuesday morning.  Please take pain medication as prescribed.  Your pain medication has been sent to the [facility] at [facility], is the only 24-hour pharmacy nearby.  Please use ice as needed.  Keep the splint dry.

## 2019-01-07 NOTE — ED Provider Notes (Signed)
[facility] DEPT Provider Note   CSN: [id number] Arrival date & time: 01/07/19  2027    History   Chief Complaint Chief Complaint  Patient presents with  . Fall    HPI Megan [name] is a 68 y.o. female.     Patient presents to the emergency department with a chief complaint of slip and fall.  She states that she slipped on her deck after she had cleaned it.  She states that she fell on an outstretched arm, and it bent underneath her.  She complains of severe pain at her left wrist.  She states that she did bump her head on a chest of drawers on the way down, but did not hit it very hard.  She did not pass out.  She is not anticoagulated.  She denies any had any headache now.  Denies any other associated symptoms.  She has taken Percocet in triage with some relief.  Denies any numbness, weakness, or tingling.  Pain is worsened with palpation and movement.  The history is provided by the patient. No language interpreter was used.    Past Medical History:  Diagnosis Date  . 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 Allergic state 12/31/2012  . Anxiety   . Asthma   . Barrett esophagus   . Blind loop syndrome   . C. difficile diarrhea   . Cancer ([place])   . Chest pain, atypical 07/14/2011  . Cold sore 07/17/2016  . Colon polyp   . Contact dermatitis 03/23/2012  . Cystocele, midline   . Depression   . Diarrhea   . Diverticulosis   . Endometrial hyperplasia 02/27/2006   BENIGN ENDO BX ON 02/2007  . GERD (gastroesophageal reflux disease)   . Headache 04/16/2015  . Headache   . Headache(784.0) 04/15/2012  . Hematuria 04/27/2012  . Hepatic cyst   . Hiatal hernia   . Hiatal hernia   . Hyperlipidemia, mixed 12/17/2014  . Hypertension   . IBS (irritable bowel syndrome)   . Insomnia   . Leaky heart valve   . Low back pain 08/15/2013  . Osteoporosis 03/2017   T score -2.5. Without significant loss from prior studies  .  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 Paronychia of great toe, left 06/19/2013  . Preventative health care 09/11/2014  . Rectal bleeding 10/10/2011  . Rectocele   . 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 Sinusitis acute 02/03/2012  . Thrush 07/21/2011  . 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 Unspecified hypothyroidism   . Unspecified menopausal and postmenopausal disorder   . Uterine prolapse without mention of vaginal wall prolapse   . Vitamin B12 deficiency   . Vitamin D deficiency 07/17/2016    Patient Active Problem List   Diagnosis Date Noted  . Chronic migraine 04/07/2018  . New onset headache 04/07/2018  . Sore throat 01/19/2018  . Moderate obstructive sleep apnea 05/21/2017  . Nausea 04/12/2017  . Vitamin D deficiency 07/17/2016  . Abdominal aortic atherosclerosis ([place]) 07/16/2016  . Renal artery stenosis ([place]) 06/12/2016  . Chronic low back pain 08/24/2015  . Memory loss 08/24/2015  . New onset of headaches after age 67 04/26/2015  . Headache 04/16/2015  . Hyperlipidemia, mixed 12/17/2014  . Preventative health care 09/11/2014  . Hepatic cyst 05/22/2014  . Insomnia 03/10/2014  . Low back pain 08/15/2013  . 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 Allergic state 12/31/2012  . Osteoporosis 09/07/2012  . 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 Mitral regurgitation 07/21/2011  . Gastroesophageal reflux disease with hiatal hernia 06/17/2011  . Endometrial hyperplasia   . Diverticulosis   . IBS (irritable bowel syndrome) 04/24/2011  . Barrett's esophagus 03/04/2011  . Hiatal hernia 03/04/2011  . BASAL CELL CARCINOMA, FACE 11/15/2009  . Migraine 11/15/2009  . WEIGHT GAIN 06/05/2009  . Hyperglycemia 06/05/2009  . Vitamin B12 deficiency 02/15/2009  . 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 Essential hypertension 01/04/2009  . ANXIETY DEPRESSION 12/14/2007    Past Surgical History:  Procedure Laterality Date  . APPENDECTOMY    . endoscopic hemoclip    .  eye surgery Left    lens implant  . HYSTEROSCOPY     POLYP  . PERIPHERAL VASCULAR CATHETERIZATION Left 07/24/2016   Procedure: Renal Angiography;  Surgeon: Conrad Sumner, MD;  Location: [facility];  Service: Cardiovascular;  Laterality: Left;  . UPPER GI ENDOSCOPY    . UTERINE FIBROID SURGERY       OB History    Gravida  6   Para  4   Term      Preterm      AB  2   Living  4     SAB      TAB      Ectopic      Multiple      Live Births               Home Medications    Prior to Admission medications   Medication Sig Start Date End Date Taking? Authorizing Provider  albuterol (PROVENTIL) (2.5 MG/3ML) 0.083% nebulizer solution INHALE THE CONTENTS OF 1 VIAL VIA NEBULIZER EVERY 6 HOURS AS NEEDED FOR WHEEZING  OR FOR SHORTNESS OF BREATH 11/01/18   Brunetta Jeans, PA-C  ALPRAZolam Duanne Moron) 1 MG tablet TAKE 1 TABLET(1 MG) BY MOUTH TWICE DAILY AS NEEDED FOR ANXIETY 12/23/18   Brunetta Jeans, PA-C  amoxicillin-clavulanate (AUGMENTIN) 875-125 MG tablet Take 1 tablet by mouth 2 (two) times daily. 12/07/18   Hassell Done Mary-Margaret, FNP  aspirin EC 81 MG tablet Take 1 tablet (81 mg total) by mouth daily. 06/15/16   [name], MD  azelastine (ASTELIN) 0.1 % nasal spray Place 1 spray into both nostrils 2 (two) times daily as needed for rhinitis. Use in each nostril as directed    [provider]  benzonatate (TESSALON) 100 MG capsule Take 1 capsule (100 mg total) by mouth 3 (three) times daily as needed for cough. 10/18/18   Brunetta Jeans, PA-C  Biotin 1000 MCG tablet Take 1,000 mcg by mouth 3 (three) times daily.    [provider]  carvedilol (COREG) 12.5 MG tablet TAKE 1 TABLET EVERY DAY 11/01/18   Sherren Mocha, MD  Cholecalciferol (VITAMIN D-3) 5000 units TABS Take 1 tablet by mouth daily. 03/04/17   Brunetta Jeans, PA-C  diltiazem (CARTIA XT) 180 MG 24 hr capsule Take 1 capsule (180 mg total) by mouth daily. 08/19/18 08/14/19  Sherren Mocha, MD  dorzolamide-timolol (COSOPT) 22.3-6.8 MG/ML ophthalmic solution  06/28/18   [provider]  doxycycline (VIBRAMYCIN) 100 MG capsule Take 1 capsule (100 mg total) by mouth 2 (two) times daily. 10/18/18   Brunetta Jeans, PA-C  fluticasone [facility]) 50 MCG/ACT nasal spray SHAKE LIQUID AND USE 2 SPRAYS IN [facility] NOSTRIL DAILY 10/18/18   Brunetta Jeans, PA-C  gabapentin (NEURONTIN) 100 MG capsule Take 1 capsule (100 mg total) by mouth 3 (three) times  daily. 05/05/18   Marcial Pacas, MD  hyoscyamine (LEVBID) 0.375 MG 12 hr tablet TAKE 1 TABLET BY MOUTH 2 TIMES DAILY. 07/26/18   Brunetta Jeans, PA-C  indomethacin (INDOCIN) 50 MG capsule Take 1 capsule (50 mg total) by mouth 2 (two) times daily as needed. 04/07/18   Marcial Pacas, MD  ondansetron (ZOFRAN ODT) 4 MG disintegrating tablet Take 1 tablet (4 mg total) by mouth every 8 (eight) hours as needed. 04/07/18   Marcial Pacas, MD  ondansetron (ZOFRAN) 4 MG tablet TAKE 1 TABLET BY MOUTH EVERY 8 HOURS AS NEEDED FOR NAUSEA AND VOMITING 02/10/18   Brunetta Jeans, PA-C  promethazine (PHENERGAN) 25 MG tablet TAKE 1 TABLET BY MOUTH THREE TIMES DAILY AS NEEDED FOR NAUSEA AND VOMITING 12/17/17   Brunetta Jeans, PA-C  rosuvastatin (CRESTOR) 10 MG tablet Take 1 tablet (10 mg total) by mouth daily. 10/13/18 10/08/19  Sherren Mocha, MD  sertraline (ZOLOFT) 100 MG tablet TAKE 1 TABLET(100 MG) BY MOUTH DAILY 11/08/18   Brunetta Jeans, PA-C  tiZANidine (ZANAFLEX) 2 MG tablet TAKE 1/2 TO 1 TABLET BY MOUTH EVERY 6 HOURS AS NEEDED FOR MUSCLE SPASMS 11/20/18   Brunetta Jeans, PA-C  traZODone (DESYREL) 50 MG tablet TAKE 1/2 TO 1 TABLET AT BEDTIME AS NEEDED FOR  SLEEP 11/01/18   Brunetta Jeans, PA-C  vitamin E 400 UNIT capsule Take 400 Units by mouth daily.    [provider]    Family History Family History  Problem Relation Age of Onset  . Colon cancer Mother   . Diabetes Mother   . Hypertension Mother   . Colon polyps Father   .  Parkinson's disease Father   . Colon cancer Maternal Grandmother   . Breast cancer Maternal Grandmother 74  . Diabetes Paternal Grandmother   . Breast cancer Paternal Grandmother 72  . Allergies Sister   . Arthritis Brother        b/l hip replace  . Alcohol abuse Daughter   . Arthritis Son   . Hypertension Son   . Allergies Son   . Osteoporosis Son   . Osteoporosis Son   . Arthritis Son        back disease, DDD  . Allergies Son   . Arthritis Son   . Irritable bowel syndrome Son        RSD  . Hypertension Paternal Grandfather   . Heart disease Paternal Grandfather   . Aneurysm Paternal Grandfather   . Colon cancer Maternal Aunt   . Colon cancer Cousin        X3  . Ovarian cancer Cousin     Social History Social History   Tobacco Use  . Smoking status: Former Smoker    Last attempt to quit: 08/19/1995    Years since quitting: 23.4  . Smokeless tobacco: Never Used  Substance Use Topics  . Alcohol use: No    Alcohol/week: 0.0 standard drinks  . Drug use: No    Types: Hydrocodone     Allergies   Iodine-131; Sulfamethoxazole; Amitriptyline; Effexor [venlafaxine]; Iohexol; Lactose intolerance (gi); Lexapro [escitalopram oxalate]; and Iodinated diagnostic agents   Review of Systems Review of Systems  All other systems reviewed and are negative.    Physical Exam Updated Vital Signs BP (!) 183/88 (BP Location: Right Arm)   Pulse 72   Temp 98.4 F (36.9 C) (Oral)   Resp 15   Ht 5\' 5"  (1.651 m)   Wt 68 kg  SpO2 98%   BMI 24.96 kg/m   Physical Exam Nursing note and vitals reviewed.  Constitutional: Pt appears well-developed and well-nourished. No distress.  HENT:  Head: Normocephalic and atraumatic.  Eyes: Conjunctivae are normal.  Neck: Normal range of motion.  Cardiovascular: Normal rate, regular rhythm. Intact distal pulses.   Capillary refill < 3 sec.  Pulmonary/Chest: Effort normal and breath sounds normal.  Musculoskeletal:  Left wrist is  swollen and deformed, moderate tenderness to palpation ROM: deferred 2/2 pain  Strength: deferred 2/2 pain  Neurological: Pt  is alert. Coordination normal.  Sensation: 5/5 Skin: Skin is warm and dry. Pt is not diaphoretic.  No evidence of open wound or skin tenting Psychiatric: Pt has a normal mood and affect.     ED Treatments / Results  Labs (all labs ordered are listed, but only abnormal results are displayed) Labs Reviewed - No data to display  EKG None  Radiology Dg Wrist Complete Left  Result Date: 01/07/2019 CLINICAL DATA:  Pain status post fall EXAM: LEFT WRIST - COMPLETE 3+ VIEW COMPARISON:  None. FINDINGS: There is an acute comminuted intra-articular fracture involving the distal radius. There is a displaced fracture of the ulnar styloid process. There is dorsal angulation of the distal radius. There is surrounding soft tissue swelling. IMPRESSION: 1. Acute comminuted intra-articular fracture involving the distal radius. 2. Displaced fracture of the ulnar styloid process. Electronically Signed   By: Constance Holster M.D.   On: 01/07/2019 21:53    Procedures Procedures (including critical care time)  Medications Ordered in ED Medications  oxyCODONE-acetaminophen (PERCOCET/ROXICET) 5-325 MG per tablet 1 tablet (1 tablet Oral Given 01/07/19 2143)     Initial Impression / Assessment and Plan / ED Course  I have reviewed the triage vital signs and the nursing notes.  Pertinent labs & imaging results that were available during my care of the patient were reviewed by me and considered in my medical decision making (see chart for details).        Patient presents with injury to left wrist.  DDx includes, fracture, strain, or sprain.  Consultants: Dr. Lenon Curt, hand surgery. Call on Tuesday.  Plain films reveal left intra-articular radius fracture and ulnar styloid fx.  Pt advised to follow up with PCP and/or orthopedics. Patient given sugar tong splint while in ED,  conservative therapy such as RICE recommended and discussed.   Patient will be discharged home & is agreeable with above plan. Returns precautions discussed. Pt appears safe for discharge.   Final Clinical Impressions(s) / ED Diagnoses   Final diagnoses:  Closed fracture of left wrist, initial encounter    ED Discharge Orders         Ordered    HYDROcodone-acetaminophen (NORCO/VICODIN) 5-325 MG tablet  Every 6 hours PRN     01/07/19 2320           Montine Circle, PA-C 01/07/19 2323    Sherwood Gambler, MD 01/07/19 2333

## 2019-01-07 NOTE — ED Notes (Signed)
Bed: WTR5 Expected date:  Expected time:  Means of arrival:  Comments: 

## 2019-01-07 NOTE — ED Triage Notes (Signed)
Patient slipped and fell on a slippery deck. Patient hit her head and hurt her left wrist. Patient states her left wrist hurts but her head doesn't.

## 2019-01-12 DIAGNOSIS — K581 Irritable bowel syndrome with constipation: Secondary | ICD-10-CM | POA: Diagnosis not present

## 2019-01-12 DIAGNOSIS — D3A010 Benign carcinoid tumor of the duodenum: Secondary | ICD-10-CM | POA: Diagnosis not present

## 2019-01-12 DIAGNOSIS — S52501A Unspecified fracture of the lower end of right radius, initial encounter for closed fracture: Secondary | ICD-10-CM | POA: Insufficient documentation

## 2019-01-12 DIAGNOSIS — S52591A Other fractures of lower end of right radius, initial encounter for closed fracture: Secondary | ICD-10-CM | POA: Diagnosis not present

## 2019-01-12 DIAGNOSIS — M6289 Other specified disorders of muscle: Secondary | ICD-10-CM | POA: Diagnosis not present

## 2019-01-12 DIAGNOSIS — K219 Gastro-esophageal reflux disease without esophagitis: Secondary | ICD-10-CM | POA: Diagnosis not present

## 2019-01-13 ENCOUNTER — Other Ambulatory Visit: Payer: Self-pay | Admitting: Orthopedic Surgery

## 2019-01-13 ENCOUNTER — Encounter (HOSPITAL_BASED_OUTPATIENT_CLINIC_OR_DEPARTMENT_OTHER): Payer: Self-pay | Admitting: *Deleted

## 2019-01-13 ENCOUNTER — Other Ambulatory Visit (HOSPITAL_COMMUNITY)
Admission: RE | Admit: 2019-01-13 | Discharge: 2019-01-13 | Disposition: A | Discharge status: Home / Self Care | Payer: Medicare HMO | Source: Ambulatory Visit | Attending: Orthopedic Surgery | Admitting: Orthopedic Surgery

## 2019-01-13 ENCOUNTER — Other Ambulatory Visit: Payer: Self-pay

## 2019-01-13 DIAGNOSIS — Z1159 Encounter for screening for other viral diseases: Secondary | ICD-10-CM | POA: Diagnosis not present

## 2019-01-14 LAB — NOVEL CORONAVIRUS, NAA (HOSP ORDER, SEND-OUT TO REF LAB; TAT 18-24 HRS): SARS-CoV-2, NAA: NOT DETECTED

## 2019-01-17 ENCOUNTER — Ambulatory Visit (HOSPITAL_BASED_OUTPATIENT_CLINIC_OR_DEPARTMENT_OTHER): Payer: Medicare HMO | Admitting: Anesthesiology

## 2019-01-17 ENCOUNTER — Encounter (HOSPITAL_BASED_OUTPATIENT_CLINIC_OR_DEPARTMENT_OTHER): Payer: Self-pay | Admitting: *Deleted

## 2019-01-17 ENCOUNTER — Ambulatory Visit (HOSPITAL_BASED_OUTPATIENT_CLINIC_OR_DEPARTMENT_OTHER)
Admission: RE | Admit: 2019-01-17 | Discharge: 2019-01-17 | Disposition: A | Discharge status: Home / Self Care | Payer: Medicare HMO | Attending: Orthopedic Surgery | Admitting: Orthopedic Surgery

## 2019-01-17 ENCOUNTER — Encounter (HOSPITAL_BASED_OUTPATIENT_CLINIC_OR_DEPARTMENT_OTHER)
Admission: RE | Disposition: A | Discharge status: Home / Self Care | Payer: Self-pay | Source: Home / Self Care | Attending: Orthopedic Surgery

## 2019-01-17 ENCOUNTER — Other Ambulatory Visit: Payer: Self-pay

## 2019-01-17 DIAGNOSIS — S52502A Unspecified fracture of the lower end of left radius, initial encounter for closed fracture: Secondary | ICD-10-CM | POA: Diagnosis not present

## 2019-01-17 DIAGNOSIS — J45909 Unspecified asthma, uncomplicated: Secondary | ICD-10-CM | POA: Insufficient documentation

## 2019-01-17 DIAGNOSIS — Z888 Allergy status to other drugs, medicaments and biological substances status: Secondary | ICD-10-CM | POA: Insufficient documentation

## 2019-01-17 DIAGNOSIS — Z91041 Radiographic dye allergy status: Secondary | ICD-10-CM | POA: Insufficient documentation

## 2019-01-17 DIAGNOSIS — K449 Diaphragmatic hernia without obstruction or gangrene: Secondary | ICD-10-CM | POA: Diagnosis not present

## 2019-01-17 DIAGNOSIS — G4730 Sleep apnea, unspecified: Secondary | ICD-10-CM | POA: Insufficient documentation

## 2019-01-17 DIAGNOSIS — W010XXA Fall on same level from slipping, tripping and stumbling without subsequent striking against object, initial encounter: Secondary | ICD-10-CM | POA: Insufficient documentation

## 2019-01-17 DIAGNOSIS — I10 Essential (primary) hypertension: Secondary | ICD-10-CM | POA: Insufficient documentation

## 2019-01-17 DIAGNOSIS — S52572A Other intraarticular fracture of lower end of left radius, initial encounter for closed fracture: Secondary | ICD-10-CM | POA: Diagnosis not present

## 2019-01-17 DIAGNOSIS — Z87891 Personal history of nicotine dependence: Secondary | ICD-10-CM | POA: Insufficient documentation

## 2019-01-17 DIAGNOSIS — G8918 Other acute postprocedural pain: Secondary | ICD-10-CM | POA: Diagnosis not present

## 2019-01-17 DIAGNOSIS — Z882 Allergy status to sulfonamides status: Secondary | ICD-10-CM | POA: Diagnosis not present

## 2019-01-17 HISTORY — PX: OPEN REDUCTION INTERNAL FIXATION (ORIF) DISTAL RADIAL FRACTURE: SHX5989

## 2019-01-17 SURGERY — OPEN REDUCTION INTERNAL FIXATION (ORIF) DISTAL RADIUS FRACTURE
Anesthesia: Monitor Anesthesia Care | Site: Wrist | Laterality: Left

## 2019-01-17 MED ORDER — CLONIDINE HCL (ANALGESIA) 100 MCG/ML EP SOLN
EPIDURAL | Status: DC | PRN
  Administered 2019-01-17: 100 ug

## 2019-01-17 MED ORDER — FENTANYL CITRATE (PF) 100 MCG/2ML IJ SOLN: 25.0000 ug | INTRAMUSCULAR | Status: DC | PRN

## 2019-01-17 MED ORDER — PROPOFOL 500 MG/50ML IV EMUL
INTRAVENOUS | Status: DC | PRN
  Administered 2019-01-17: 50 ug/kg/min via INTRAVENOUS

## 2019-01-17 MED ORDER — BACITRACIN ZINC 500 UNIT/GM EX OINT
TOPICAL_OINTMENT | CUTANEOUS | Status: AC
  Filled 2019-01-17: qty 28.35

## 2019-01-17 MED ORDER — CEFAZOLIN SODIUM-DEXTROSE 2-4 GM/100ML-% IV SOLN
INTRAVENOUS | Status: AC
  Filled 2019-01-17: qty 100

## 2019-01-17 MED ORDER — MIDAZOLAM HCL 2 MG/2ML IJ SOLN
1.0000 mg | INTRAMUSCULAR | Status: DC | PRN
  Administered 2019-01-17: 1 mg via INTRAVENOUS

## 2019-01-17 MED ORDER — HYDROCODONE-ACETAMINOPHEN 5-325 MG PO TABS: 1.0000 | ORAL_TABLET | Freq: Four times a day (QID) | ORAL | 0 refills | Status: DC | PRN

## 2019-01-17 MED ORDER — LIDOCAINE-EPINEPHRINE 1 %-1:100000 IJ SOLN
INTRAMUSCULAR | Status: AC
  Filled 2019-01-17: qty 1

## 2019-01-17 MED ORDER — FENTANYL CITRATE (PF) 100 MCG/2ML IJ SOLN
INTRAMUSCULAR | Status: AC
  Filled 2019-01-17: qty 2

## 2019-01-17 MED ORDER — LACTATED RINGERS IV SOLN
INTRAVENOUS | Status: DC
  Administered 2019-01-17: 13:00:00 via INTRAVENOUS

## 2019-01-17 MED ORDER — FENTANYL CITRATE (PF) 100 MCG/2ML IJ SOLN
50.0000 ug | INTRAMUSCULAR | Status: DC | PRN
  Administered 2019-01-17 (×2): 50 ug via INTRAVENOUS

## 2019-01-17 MED ORDER — LIDOCAINE 2% (20 MG/ML) 5 ML SYRINGE
INTRAMUSCULAR | Status: DC | PRN
  Administered 2019-01-17: 40 mg via INTRAVENOUS

## 2019-01-17 MED ORDER — PROPOFOL 10 MG/ML IV BOLUS
INTRAVENOUS | Status: DC | PRN
  Administered 2019-01-17: 150 mg via INTRAVENOUS

## 2019-01-17 MED ORDER — CHLORHEXIDINE GLUCONATE 4 % EX LIQD: 60.0000 mL | Freq: Once | CUTANEOUS | Status: DC

## 2019-01-17 MED ORDER — BUPIVACAINE HCL (PF) 0.5 % IJ SOLN
INTRAMUSCULAR | Status: DC | PRN
  Administered 2019-01-17: 20 mL via PERINEURAL

## 2019-01-17 MED ORDER — CEFAZOLIN SODIUM-DEXTROSE 2-4 GM/100ML-% IV SOLN
2.0000 g | INTRAVENOUS | Status: AC
  Administered 2019-01-17: 2 g via INTRAVENOUS

## 2019-01-17 MED ORDER — MIDAZOLAM HCL 2 MG/2ML IJ SOLN
INTRAMUSCULAR | Status: AC
  Filled 2019-01-17: qty 2

## 2019-01-17 MED ORDER — ONDANSETRON HCL 4 MG/2ML IJ SOLN
INTRAMUSCULAR | Status: DC | PRN
  Administered 2019-01-17: 4 mg via INTRAVENOUS

## 2019-01-17 MED ORDER — PROMETHAZINE HCL 25 MG/ML IJ SOLN
6.2500 mg | INTRAMUSCULAR | Status: DC | PRN
  Administered 2019-01-17: 6.25 mg via INTRAVENOUS

## 2019-01-17 MED ORDER — PROMETHAZINE HCL 25 MG/ML IJ SOLN
INTRAMUSCULAR | Status: AC
  Filled 2019-01-17: qty 1

## 2019-01-17 MED ORDER — SCOPOLAMINE 1 MG/3DAYS TD PT72: 1.0000 | MEDICATED_PATCH | Freq: Once | TRANSDERMAL | Status: DC | PRN

## 2019-01-17 SURGICAL SUPPLY — 61 items
BIT DRILL 2.0 LNG QUCK RELEASE (BIT) ×1 IMPLANT
BIT DRILL 2.8X5 QR DISP (BIT) ×3 IMPLANT
BLADE MINI RND TIP GREEN BEAV (BLADE) IMPLANT
BLADE SURG 15 STRL LF DISP TIS (BLADE) ×1 IMPLANT
BLADE SURG 15 STRL SS (BLADE) ×2
BNDG COHESIVE 3X5 TAN STRL LF (GAUZE/BANDAGES/DRESSINGS) ×3 IMPLANT
BNDG ESMARK 4X9 LF (GAUZE/BANDAGES/DRESSINGS) ×3 IMPLANT
BNDG GAUZE ELAST 4 BULKY (GAUZE/BANDAGES/DRESSINGS) ×3 IMPLANT
CHLORAPREP W/TINT 26 (MISCELLANEOUS) ×3 IMPLANT
CORD BIPOLAR FORCEPS 12FT (ELECTRODE) ×3 IMPLANT
COVER BACK TABLE REUSABLE LG (DRAPES) ×3 IMPLANT
COVER MAYO STAND REUSABLE (DRAPES) ×3 IMPLANT
COVER WAND RF STERILE (DRAPES) IMPLANT
CUFF TOURN SGL QUICK 18X4 (TOURNIQUET CUFF) ×3 IMPLANT
DECANTER SPIKE VIAL GLASS SM (MISCELLANEOUS) IMPLANT
DRAPE EXTREMITY T 121X128X90 (DISPOSABLE) ×3 IMPLANT
DRAPE OEC MINIVIEW 54X84 (DRAPES) ×3 IMPLANT
DRAPE SURG 17X23 STRL (DRAPES) ×3 IMPLANT
DRILL 2.0 LNG QUICK RELEASE (BIT) ×3
GAUZE SPONGE 4X4 12PLY STRL (GAUZE/BANDAGES/DRESSINGS) ×3 IMPLANT
GAUZE XEROFORM 1X8 LF (GAUZE/BANDAGES/DRESSINGS) ×3 IMPLANT
GLOVE BIO SURGEON STRL SZ 6.5 (GLOVE) ×4 IMPLANT
GLOVE BIO SURGEON STRL SZ7 (GLOVE) ×3 IMPLANT
GLOVE BIO SURGEONS STRL SZ 6.5 (GLOVE) ×2
GLOVE BIOGEL PI IND STRL 7.0 (GLOVE) ×3 IMPLANT
GLOVE BIOGEL PI IND STRL 8 (GLOVE) ×1 IMPLANT
GLOVE BIOGEL PI IND STRL 8.5 (GLOVE) ×1 IMPLANT
GLOVE BIOGEL PI INDICATOR 7.0 (GLOVE) ×6
GLOVE BIOGEL PI INDICATOR 8 (GLOVE) ×2
GLOVE BIOGEL PI INDICATOR 8.5 (GLOVE) ×2
GLOVE SURG ORTHO 8.0 STRL STRW (GLOVE) ×3 IMPLANT
GOWN STRL REUS W/ TWL LRG LVL3 (GOWN DISPOSABLE) ×1 IMPLANT
GOWN STRL REUS W/TWL LRG LVL3 (GOWN DISPOSABLE) ×2
GOWN STRL REUS W/TWL XL LVL3 (GOWN DISPOSABLE) ×9 IMPLANT
GUIDEWIRE ORTHO 0.054X6 (WIRE) ×9 IMPLANT
NEEDLE PRECISIONGLIDE 27X1.5 (NEEDLE) IMPLANT
NS IRRIG 1000ML POUR BTL (IV SOLUTION) ×3 IMPLANT
PACK BASIN DAY SURGERY FS (CUSTOM PROCEDURE TRAY) ×3 IMPLANT
PAD CAST 3X4 CTTN HI CHSV (CAST SUPPLIES) ×1 IMPLANT
PADDING CAST COTTON 3X4 STRL (CAST SUPPLIES) ×2
PLATE PROX NARROW LEFT (Plate) ×3 IMPLANT
SCREW CORT FT 18X2.3XLCK HEX (Screw) ×1 IMPLANT
SCREW CORT FT 20X2.3XLCK HEX (Screw) ×1 IMPLANT
SCREW CORTICAL LOCKING 2.3X18M (Screw) ×6 IMPLANT
SCREW CORTICAL LOCKING 2.3X20M (Screw) ×6 IMPLANT
SCREW FX18X2.3XSMTH LCK NS CRT (Screw) ×2 IMPLANT
SCREW FX20X2.3XSMTH LCK NS CRT (Screw) ×2 IMPLANT
SCREW NLCKG 13 3.5X13 HEXA (Screw) ×1 IMPLANT
SCREW NON LOCK 3.5X10MM (Screw) ×3 IMPLANT
SCREW NON-LOCK 3.5X13 (Screw) ×3 IMPLANT
SCREW NONLOCK HEX 3.5X12 (Screw) ×6 IMPLANT
SLEEVE SCD COMPRESS KNEE MED (MISCELLANEOUS) ×3 IMPLANT
SPLINT PLASTER CAST XFAST 3X15 (CAST SUPPLIES) IMPLANT
SPLINT PLASTER XTRA FASTSET 3X (CAST SUPPLIES)
STOCKINETTE 4X48 STRL (DRAPES) ×3 IMPLANT
SUT ETHILON 4 0 PS 2 18 (SUTURE) ×3 IMPLANT
SUT VICRYL 4-0 PS2 18IN ABS (SUTURE) ×3 IMPLANT
SYR BULB 3OZ (MISCELLANEOUS) ×3 IMPLANT
SYR CONTROL 10ML LL (SYRINGE) IMPLANT
TOWEL GREEN STERILE FF (TOWEL DISPOSABLE) ×3 IMPLANT
UNDERPAD 30X30 (UNDERPADS AND DIAPERS) ×3 IMPLANT

## 2019-01-17 NOTE — Discharge Instructions (Signed)

## 2019-01-17 NOTE — H&P (Signed)
Megan [name] is an 68 y.o. female.   Chief Complaint: distal radius fracture left wrist HPI: Megan [name] is a 67 year old right-hand-dominant female who sustained a fall on 2220. She was slipped on her deck. And landed on her low left arm behind her. States she heard a pop and had immediate pain. She was taken to [facility] long emergency room where x-rays revealed distal radius comminuted fracture. She was placed in a long-arm splint given ago and referred. No prior history of injury to the hand no prior history of fractures. She has no history of diabetes thyroid problems arthritis or gout. Family history is negative for each of these. She complains of a throbbing pain right in nature. She has not complained of any numbness or tingling   Past Medical History:  Diagnosis Date  . 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 Allergic state 12/31/2012  . Anxiety   . Asthma   . Barrett esophagus   . Blind loop syndrome   . C. difficile diarrhea   . Cancer ([place])   . Chest pain, atypical 07/14/2011  . Cold sore 07/17/2016  . Colon polyp   . Contact dermatitis 03/23/2012  . Cystocele, midline   . Depression   . Diarrhea   . Diverticulosis   . Endometrial hyperplasia 02/27/2006   BENIGN ENDO BX ON 02/2007  . GERD (gastroesophageal reflux disease)   . Headache 04/16/2015  . Headache   . Headache(784.0) 04/15/2012  . Hematuria 04/27/2012  . Hepatic cyst   . Hiatal hernia   . Hiatal hernia   . Hyperlipidemia, mixed 12/17/2014  . Hypertension   . IBS (irritable bowel syndrome)   . Insomnia   . Leaky heart valve   . Low back pain 08/15/2013  . Osteoporosis 03/2017   T score -2.5. Without significant loss from prior studies  . 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 Paronychia of great toe, left 06/19/2013  . Preventative health care 09/11/2014  . Rectal bleeding 10/10/2011  . Rectocele   . 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 Sinusitis acute 02/03/2012  . Thrush 07/21/2011  .  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 Unspecified hypothyroidism   . Unspecified menopausal and postmenopausal disorder   . Uterine prolapse without mention of vaginal wall prolapse   . Vitamin B12 deficiency   . Vitamin D deficiency 07/17/2016    Past Surgical History:  Procedure Laterality Date  . APPENDECTOMY    . endoscopic hemoclip    . eye surgery Left    lens implant  . HYSTEROSCOPY     POLYP  . PERIPHERAL VASCULAR CATHETERIZATION Left 07/24/2016   Procedure: Renal Angiography;  Surgeon: Conrad Colon, MD;  Location: [facility];  Service: Cardiovascular;  Laterality: Left;  . UPPER GI ENDOSCOPY    . UTERINE FIBROID SURGERY      Family History  Problem Relation Age of Onset  . Colon cancer Mother   . Diabetes Mother   . Hypertension Mother   . Colon polyps Father   . Parkinson's disease Father   . Colon cancer Maternal Grandmother   . Breast cancer Maternal Grandmother 53  . Diabetes Paternal Grandmother   . Breast cancer Paternal Grandmother 78  . Allergies Sister   . Arthritis Brother        b/l hip replace  . Alcohol abuse Daughter   . Arthritis Son   . Hypertension Son   . Allergies Son   . Osteoporosis Son   .  Osteoporosis Son   . Arthritis Son        back disease, DDD  . Allergies Son   . Arthritis Son   . Irritable bowel syndrome Son        RSD  . Hypertension Paternal Grandfather   . Heart disease Paternal Grandfather   . Aneurysm Paternal Grandfather   . Colon cancer Maternal Aunt   . Colon cancer Cousin        X3  . Ovarian cancer Cousin    Social History:  reports that she quit smoking about 23 years ago. She has never used smokeless tobacco. She reports that she does not drink alcohol or use drugs.  Allergies:  Allergies  Allergen Reactions  . Iodine-131 Other (See Comments)    Other reaction(s): Dizziness (intolerance)  . Sulfamethoxazole Shortness Of Breath    chest tightness  . Amitriptyline Anxiety and Other (See Comments)     Depression, insomnia  . Effexor [Venlafaxine]     palpitations  . Iohexol Other (See Comments)    Passed out  . Lactose Intolerance (Gi) Diarrhea and Nausea And Vomiting  . Lexapro [Escitalopram Oxalate]     nausea  . Iodinated Diagnostic Agents     Pt states at Dr.Grapeys office 15 years ago blacked out for 2 hours during injection for IVP. Pt says she had dye recently with no problems     No medications prior to admission.    No results found for this or any previous visit (from the past 48 hour(s)).  No results found.   Pertinent items are noted in HPI.  Height 5\' 6"  (1.676 m), weight 71.2 kg.  General appearance: alert, cooperative and appears stated age Head: Normocephalic, without obvious abnormality Neck: no JVD Resp: clear to auscultation bilaterally Cardio: regular rate and rhythm, S1, S2 normal, no murmur, click, rub or gallop GI: soft, non-tender; bowel sounds normal; no masses,  no organomegaly Extremities: left wrist pain Pulses: 2+ and symmetric Skin: Skin color, texture, turgor normal. No rashes or lesions Neurologic: Grossly normal Incision/Wound: nad  Assessment/Plan Assessment:  1. Other closed fracture of distal end of right radius, initial encounter    Plan: Have recommended open reduction internal fixation distal radius fracture with possible bone graft pre-peri-postoperative course are discussed along with risks and complications. She is aware there is no guarantee to the surgery possibility of infection recurrence injury to arteries nerves tendons nonunion possibility of allograft being placed possibility of hardware complications. She is scheduled for open reduction internal fixation volar plate fixation left distal radius in outpatient under regional anesthesia. Her [facility] from [facility] are reviewed. He is placed in a splint by Herbie Baltimore desmoid PA-C. She advised the importance of elevation. We will give her a prescription for Celebrex to take for  preoperative pain.   Daryll Brod 01/17/2019, 5:20 AM

## 2019-01-17 NOTE — Brief Op Note (Signed)
01/17/2019  3:30 PM  PATIENT:  Megan [name]  68 y.o. female  PRE-OPERATIVE DIAGNOSIS:  DISTAL RADIUS FRACTURE LEFT  POST-OPERATIVE DIAGNOSIS:  DISTAL RADIUS FRACTURE LEFT  PROCEDURE:  Procedure(s) with comments: LEFT OPEN REDUCTION INTERNAL FIXATION (ORIF) DISTAL RADIUS FRACTURE (Left) - AXILLARY BLOCK  SURGEON:  Surgeon(s) and Role:    * Daryll Brod, MD - Primary    * Leanora Cover, MD - Assisting  PHYSICIAN ASSISTANT:   ASSISTANTS: K Manuela Halbur,MD   ANESTHESIA:   regional and IV sedation  EBL:  10 mL   BLOOD ADMINISTERED:none  DRAINS: none   LOCAL MEDICATIONS USED:  NONE  SPECIMEN:  No Specimen  DISPOSITION OF SPECIMEN:  N/A  COUNTS:  YES  TOURNIQUET:   Total Tourniquet Time Documented: Upper Arm (Left) - 51 minutes Total: Upper Arm (Left) - 51 minutes   DICTATION: .Viviann Spare Dictation  PLAN OF CARE: Discharge to home after PACU  PATIENT DISPOSITION:  PACU - hemodynamically stable.

## 2019-01-17 NOTE — Anesthesia Procedure Notes (Signed)
Procedure Name: LMA Insertion Date/Time: 01/17/2019 2:40 PM Performed by: Eulas Post, Boleslaw Borghi W, CRNA Pre-anesthesia Checklist: Patient identified, Emergency Drugs available, Suction available and Patient being monitored Patient Re-evaluated:Patient Re-evaluated prior to induction Oxygen Delivery Method: Circle system utilized Preoxygenation: Pre-oxygenation with 100% oxygen Induction Type: IV induction Ventilation: Mask ventilation without difficulty LMA: LMA inserted LMA Size: 4.0 Number of attempts: 1 Placement Confirmation: positive ETCO2 and breath sounds checked- equal and bilateral Tube secured with: Tape Dental Injury: Teeth and Oropharynx as per pre-operative assessment

## 2019-01-17 NOTE — Transfer of Care (Signed)
Immediate Anesthesia Transfer of Care Note  Patient: Megan [name]  Procedure(s) Performed: LEFT OPEN REDUCTION INTERNAL FIXATION (ORIF) DISTAL RADIUS FRACTURE (Left Wrist)  Patient Location: PACU  Anesthesia Type:General and Regional  Level of Consciousness: drowsy  Airway & Oxygen Therapy: Patient Spontanous Breathing and Patient connected to nasal cannula oxygen  Post-op Assessment: Report given to RN and Post -op Vital signs reviewed and stable  Post vital signs: Reviewed and stable  Last Vitals:  Vitals Value Taken Time  BP 128/65 01/17/2019  3:30 PM  Temp    Pulse 74 01/17/2019  3:31 PM  Resp 15 01/17/2019  3:31 PM  SpO2 93 % 01/17/2019  3:31 PM  Vitals shown include unvalidated device data.  Last Pain:  Vitals:   01/17/19 1234  PainSc: 7       Patients Stated Pain Goal: 3 (44/61/90 1222)  Complications: No apparent anesthesia complications

## 2019-01-17 NOTE — Progress Notes (Signed)
Assisted Dr. Lanetta Inch with left, ultrasound guided, supraclavicular block. Side rails up, monitors on throughout procedure. See vital signs in flow sheet. Tolerated Procedure well.

## 2019-01-17 NOTE — Op Note (Signed)
NAME: Megan [name] MEDICAL RECORD NO: [id number] DATE OF BIRTH: Jan 15, 1951 FACILITY: [facility] LOCATION: [facility] PHYSICIAN: Wynonia Sours, MD   OPERATIVE REPORT   DATE OF PROCEDURE: 01/17/19    PREOPERATIVE DIAGNOSIS:   Comminuted intra-articular fracture and 3 part left distal radius   POSTOPERATIVE DIAGNOSIS:   Same   PROCEDURE:   Open reduction internal fixation Acumed plate left distal radius   SURGEON: Daryll Brod, M.D.   ASSISTANT: Leanora Cover, MD   ANESTHESIA:  Regional with sedation   INTRAVENOUS FLUIDS:  Per anesthesia flow sheet.   ESTIMATED BLOOD LOSS:  Minimal.   COMPLICATIONS:  None.   SPECIMENS:  none   TOURNIQUET TIME:    Total Tourniquet Time Documented: Upper Arm (Left) - 51 minutes Total: Upper Arm (Left) - 51 minutes    DISPOSITION:  Stable to PACU.   INDICATIONS: Patient is a 68 year old female who sustained a fall and a fracture of her left distal radius comminuted intra-articular in nature with displacement.  This greater than 3 part.  She is admitted for open reduction internal fixation.  Pre-peri-and postoperative course been discussed along with risks and complications.  She is aware there is no guarantee to the surgery possibility of infection recurrence injury to arteries nerves tendons complete relief symptoms distally nonunion hardware failure or complication.  In preoperative area the patient is seen the extremity marked by both patient and surgeon antibiotic given  OPERATIVE COURSE: After a supraclavicular block was carried out in the preoperative area under the direction of the anesthesia department.  She was brought to the operating room where she was placed in the supine position with a left arm free.  She was prepped using ChloraPrep.  A three-minute dry time was taken and timeout taken to confirm patient procedure.  The limb was exsanguinated with an Esmarch bandage turn placed on the arm was inflated to 250 mmHg.   A volar incision was made on the volar radial aspect with a zigzag distally carried down through subcutaneous tissue.  Bleeders were electrocauterized with bipolar.  The dissection was carried through the flexor carpi radialis tendon sheath identifying the volar palmar component of the radial artery.  The brachial radialis was then tenotomized releasing it from the radial styloid.  The fracture was identified after making a longitudinal incision through the pronator quadratus.  The muscle was elevated off from the bone proximal and distal to the fracture fragment a T incision made distally.  The fracture was open debrided and reduced.  A narrow Acumed volar plate was then placed.  This was pinned in position.  Series AP lateral oblique revealed a just beneath the articular surface distally.  A gliding screw hole was filled with a 12 mm screw but not fully tightened.  This allowed the distal fracture to be fixed to maintain the fracture against the plate.  The middle screw hole was filled first.  This was followed radially using pegs on the ulnar aspect screws on the radial aspect these measured between 18 and 20 mm.  X-rays AP lateral and oblique revealed that the right and screws were well placed distally.  The oblique revealed no screws or pegs touching the articular surface.  Remaining proximal screws were then tightened the 2 remaining screws were of and 10 mm.  X-rays revealed the fracture well reduced but the most distal diaphyseal screw was felt to be short was replaced with a 13 mm screw x-rays confirmed reduction of the fracture AP  lateral and oblique directions wound was copiously irrigated with saline.  The pronator was repaired as much as possible with figure-of-eight 4-0 Vicryl sutures.  The subcutaneous tissue closed with interrupted 4-0 Vicryl and skin with interrupted 4-0 nylon sutures.  A sterile compressive dressing and volar splint was applied.  Deflation of the tourniquet all fingers immediately  pink.  She was taken to the recovery room for observation in satisfactory condition.  She will be discharged home to return [place] in 1 week on Norco 12/19/2023 20 no refill.   Daryll Brod, MD Electronically signed, 01/17/19

## 2019-01-17 NOTE — Anesthesia Preprocedure Evaluation (Addendum)
Anesthesia Evaluation  Patient identified by MRN, date of birth, ID band Patient awake    Reviewed: Allergy & Precautions, NPO status , Patient's Chart, lab work & pertinent test results, reviewed documented beta blocker date and time   History of Anesthesia Complications Negative for: history of anesthetic complications  Airway Mallampati: II  TM Distance: >3 FB Neck ROM: Full    Dental no notable dental hx. (+) Teeth Intact, Dental Advisory Given   Pulmonary asthma , sleep apnea , former smoker,    Pulmonary exam normal breath sounds clear to auscultation       Cardiovascular hypertension, Pt. on medications and Pt. on home beta blockers Normal cardiovascular exam Rhythm:Regular Rate:Normal  Stress Test 2017 Nuclear stress EF: 83%. The patient walked for 4 minutes of a Bruce protocol. Peak HR of 185 whicih is 97% PMHR. RBBB. No ST or T wave changes. The study is normal. This is a low risk study. The left ventricular ejection fraction is hyperdynamic (>65%).  TTE 2018 Normal EF, trivial MR   Neuro/Psych PSYCHIATRIC DISORDERS Anxiety Depression negative neurological ROS     GI/Hepatic Neg liver ROS, hiatal hernia, GERD  ,  Endo/Other  Hypothyroidism   Renal/GU negative Renal ROS  negative genitourinary   Musculoskeletal negative musculoskeletal ROS (+)   Abdominal   Peds  Hematology negative hematology ROS (+)   Anesthesia Other Findings   Reproductive/Obstetrics                            Anesthesia Physical Anesthesia Plan  ASA: III  Anesthesia Plan: MAC and Regional   Post-op Pain Management:  Regional for Post-op pain   Induction: Intravenous  PONV Risk Score and Plan: 2 and Propofol infusion, Treatment may vary due to age or medical condition and Ondansetron  Airway Management Planned: Natural Airway and Simple Face Mask  Additional Equipment: None  Intra-op  Plan:   Post-operative Plan:   Informed Consent: I have reviewed the patients History and Physical, chart, labs and discussed the procedure including the risks, benefits and alternatives for the proposed anesthesia with the patient or authorized representative who has indicated his/her understanding and acceptance.     Dental advisory given  Plan Discussed with: CRNA  Anesthesia Plan Comments:        Anesthesia Quick Evaluation

## 2019-01-17 NOTE — Anesthesia Procedure Notes (Signed)
Anesthesia Regional Block: Supraclavicular block   Pre-Anesthetic Checklist: ,, timeout performed, Correct Patient, Correct Site, Correct Laterality, Correct Procedure, Correct Position, site marked, Risks and benefits discussed,  Surgical consent,  Pre-op evaluation,  At surgeon's request and post-op pain management  Laterality: Left  Prep: Maximum Sterile Barrier Precautions used, chloraprep       Needles:  Injection technique: Single-shot  Needle Type: Echogenic Stimulator Needle     Needle Length: 5cm  Needle Gauge: 22     Additional Needles:   Procedures:,,,, ultrasound used (permanent image in chart),,,,  Narrative:  Start time: 01/17/2019 1:45 PM End time: 01/17/2019 1:55 PM Injection made incrementally with aspirations every 5 mL.  Performed by: Personally  Anesthesiologist: Freddrick March, MD  Additional Notes: Monitors applied. No increased pain on injection. No increased resistance to injection. Injection made in 5cc increments. Good needle visualization. Patient tolerated procedure well.

## 2019-01-17 NOTE — Op Note (Signed)
Radiology note: Multiple AP lateral and oblique x-rays using image intensification were taken throughout the case this confirmed reduction of the fracture with the pins screws in good position there were no articular penetration of any of the screws or pegs distally.

## 2019-01-17 NOTE — Op Note (Signed)
I assisted Surgeon(s) and Role:    * Daryll Brod, MD - Primary    Leanora Cover, MD - Assisting on the Procedure(s): LEFT OPEN REDUCTION INTERNAL FIXATION (ORIF) DISTAL RADIUS FRACTURE on 01/17/2019.  I provided assistance on this case as follows: retraction soft tissues, clearing and reduction fracture, placement hardware.  Electronically signed by: Leanora Cover, MD Date: 01/17/2019 Time: 3:30 PM

## 2019-01-18 ENCOUNTER — Encounter (HOSPITAL_BASED_OUTPATIENT_CLINIC_OR_DEPARTMENT_OTHER): Payer: Self-pay | Admitting: Orthopedic Surgery

## 2019-01-18 NOTE — Anesthesia Postprocedure Evaluation (Signed)
Anesthesia Post Note  Patient: Megan [name]  Procedure(s) Performed: LEFT OPEN REDUCTION INTERNAL FIXATION (ORIF) DISTAL RADIUS FRACTURE (Left Wrist)     Patient location during evaluation: PACU Anesthesia Type: Regional and General Level of consciousness: sedated Pain management: pain level controlled Vital Signs Assessment: post-procedure vital signs reviewed and stable Respiratory status: spontaneous breathing and respiratory function stable Cardiovascular status: stable Postop Assessment: no apparent nausea or vomiting Anesthetic complications: no    Last Vitals:  Vitals:   01/17/19 1615 01/17/19 1645  BP: (!) 158/70 (!) 172/71  Pulse: 65 65  Resp: 12 18  Temp:  36.6 C  SpO2: 96% 95%    Last Pain:  Vitals:   01/17/19 1645  PainSc: 0-No pain                 [name] DANIEL

## 2019-01-20 ENCOUNTER — Other Ambulatory Visit: Payer: Self-pay | Admitting: Physician Assistant

## 2019-01-21 NOTE — Telephone Encounter (Signed)
Xanax last rx 12/23/18 # 30 LOV: 11/05/18 CSC: 01/14/18 UDS: 11/05/18

## 2019-01-24 DIAGNOSIS — S52591D Other fractures of lower end of right radius, subsequent encounter for closed fracture with routine healing: Secondary | ICD-10-CM | POA: Diagnosis not present

## 2019-01-24 DIAGNOSIS — M25642 Stiffness of left hand, not elsewhere classified: Secondary | ICD-10-CM | POA: Diagnosis not present

## 2019-01-24 DIAGNOSIS — M25432 Effusion, left wrist: Secondary | ICD-10-CM | POA: Diagnosis not present

## 2019-01-24 DIAGNOSIS — M25532 Pain in left wrist: Secondary | ICD-10-CM | POA: Diagnosis not present

## 2019-01-24 DIAGNOSIS — S52591A Other fractures of lower end of right radius, initial encounter for closed fracture: Secondary | ICD-10-CM | POA: Diagnosis not present

## 2019-02-03 DIAGNOSIS — M25532 Pain in left wrist: Secondary | ICD-10-CM | POA: Diagnosis not present

## 2019-02-03 DIAGNOSIS — M25432 Effusion, left wrist: Secondary | ICD-10-CM | POA: Diagnosis not present

## 2019-02-03 DIAGNOSIS — S52591D Other fractures of lower end of right radius, subsequent encounter for closed fracture with routine healing: Secondary | ICD-10-CM | POA: Diagnosis not present

## 2019-02-03 DIAGNOSIS — M25642 Stiffness of left hand, not elsewhere classified: Secondary | ICD-10-CM | POA: Diagnosis not present

## 2019-02-08 DIAGNOSIS — M6289 Other specified disorders of muscle: Secondary | ICD-10-CM | POA: Diagnosis not present

## 2019-02-08 DIAGNOSIS — Z800 Family history of malignant neoplasm of digestive organs: Secondary | ICD-10-CM | POA: Diagnosis not present

## 2019-02-08 DIAGNOSIS — Z86010 Personal history of colonic polyps: Secondary | ICD-10-CM | POA: Diagnosis not present

## 2019-02-08 DIAGNOSIS — D3A010 Benign carcinoid tumor of the duodenum: Secondary | ICD-10-CM | POA: Diagnosis not present

## 2019-02-08 DIAGNOSIS — K219 Gastro-esophageal reflux disease without esophagitis: Secondary | ICD-10-CM | POA: Diagnosis not present

## 2019-02-08 DIAGNOSIS — K5909 Other constipation: Secondary | ICD-10-CM | POA: Diagnosis not present

## 2019-02-23 DIAGNOSIS — M25532 Pain in left wrist: Secondary | ICD-10-CM | POA: Diagnosis not present

## 2019-02-23 DIAGNOSIS — M25512 Pain in left shoulder: Secondary | ICD-10-CM | POA: Diagnosis not present

## 2019-03-02 DIAGNOSIS — M25532 Pain in left wrist: Secondary | ICD-10-CM | POA: Diagnosis not present

## 2019-03-02 DIAGNOSIS — M25642 Stiffness of left hand, not elsewhere classified: Secondary | ICD-10-CM | POA: Diagnosis not present

## 2019-03-02 DIAGNOSIS — S52591D Other fractures of lower end of right radius, subsequent encounter for closed fracture with routine healing: Secondary | ICD-10-CM | POA: Diagnosis not present

## 2019-03-02 DIAGNOSIS — M25512 Pain in left shoulder: Secondary | ICD-10-CM | POA: Diagnosis not present

## 2019-03-02 DIAGNOSIS — M25432 Effusion, left wrist: Secondary | ICD-10-CM | POA: Diagnosis not present

## 2019-03-16 DIAGNOSIS — M25532 Pain in left wrist: Secondary | ICD-10-CM | POA: Diagnosis not present

## 2019-03-16 DIAGNOSIS — M25642 Stiffness of left hand, not elsewhere classified: Secondary | ICD-10-CM | POA: Diagnosis not present

## 2019-03-16 DIAGNOSIS — S52591D Other fractures of lower end of right radius, subsequent encounter for closed fracture with routine healing: Secondary | ICD-10-CM | POA: Diagnosis not present

## 2019-03-16 DIAGNOSIS — M25512 Pain in left shoulder: Secondary | ICD-10-CM | POA: Diagnosis not present

## 2019-03-16 DIAGNOSIS — M25432 Effusion, left wrist: Secondary | ICD-10-CM | POA: Diagnosis not present

## 2019-03-23 ENCOUNTER — Other Ambulatory Visit: Payer: Self-pay | Admitting: Physician Assistant

## 2019-03-23 DIAGNOSIS — S52591D Other fractures of lower end of right radius, subsequent encounter for closed fracture with routine healing: Secondary | ICD-10-CM | POA: Diagnosis not present

## 2019-03-23 NOTE — Telephone Encounter (Signed)
Xanax last rx 01/21/19 #30 1 RF CSC: 01/14/18 LOV: 11/05/18 Anxiety/Depression  Please advise

## 2019-03-25 DIAGNOSIS — M25532 Pain in left wrist: Secondary | ICD-10-CM | POA: Diagnosis not present

## 2019-03-25 DIAGNOSIS — M25432 Effusion, left wrist: Secondary | ICD-10-CM | POA: Diagnosis not present

## 2019-03-25 DIAGNOSIS — M25642 Stiffness of left hand, not elsewhere classified: Secondary | ICD-10-CM | POA: Diagnosis not present

## 2019-03-25 DIAGNOSIS — M25512 Pain in left shoulder: Secondary | ICD-10-CM | POA: Diagnosis not present

## 2019-03-25 DIAGNOSIS — S52591D Other fractures of lower end of right radius, subsequent encounter for closed fracture with routine healing: Secondary | ICD-10-CM | POA: Diagnosis not present

## 2019-03-30 DIAGNOSIS — M25532 Pain in left wrist: Secondary | ICD-10-CM | POA: Diagnosis not present

## 2019-03-30 DIAGNOSIS — M25512 Pain in left shoulder: Secondary | ICD-10-CM | POA: Diagnosis not present

## 2019-03-30 DIAGNOSIS — M25642 Stiffness of left hand, not elsewhere classified: Secondary | ICD-10-CM | POA: Diagnosis not present

## 2019-03-30 DIAGNOSIS — S52591D Other fractures of lower end of right radius, subsequent encounter for closed fracture with routine healing: Secondary | ICD-10-CM | POA: Diagnosis not present

## 2019-03-30 DIAGNOSIS — M25432 Effusion, left wrist: Secondary | ICD-10-CM | POA: Diagnosis not present

## 2019-04-07 ENCOUNTER — Encounter: Payer: Self-pay | Admitting: Physician Assistant

## 2019-04-07 ENCOUNTER — Other Ambulatory Visit: Payer: Self-pay | Admitting: Physician Assistant

## 2019-04-07 DIAGNOSIS — B9689 Other specified bacterial agents as the cause of diseases classified elsewhere: Secondary | ICD-10-CM

## 2019-04-07 DIAGNOSIS — J019 Acute sinusitis, unspecified: Secondary | ICD-10-CM

## 2019-04-13 ENCOUNTER — Encounter: Payer: Self-pay | Admitting: Physician Assistant

## 2019-04-14 ENCOUNTER — Ambulatory Visit (INDEPENDENT_AMBULATORY_CARE_PROVIDER_SITE_OTHER): Payer: Medicare HMO | Admitting: Physician Assistant

## 2019-04-14 ENCOUNTER — Other Ambulatory Visit: Payer: Self-pay

## 2019-04-14 ENCOUNTER — Encounter: Payer: Self-pay | Admitting: Physician Assistant

## 2019-04-14 VITALS — BP 129/68 | HR 75 | Temp 97.8°F | Wt 150.0 lb

## 2019-04-14 DIAGNOSIS — J020 Streptococcal pharyngitis: Secondary | ICD-10-CM | POA: Diagnosis not present

## 2019-04-14 DIAGNOSIS — G4700 Insomnia, unspecified: Secondary | ICD-10-CM

## 2019-04-14 DIAGNOSIS — R5382 Chronic fatigue, unspecified: Secondary | ICD-10-CM | POA: Diagnosis not present

## 2019-04-14 DIAGNOSIS — F339 Major depressive disorder, recurrent, unspecified: Secondary | ICD-10-CM | POA: Diagnosis not present

## 2019-04-14 DIAGNOSIS — R6889 Other general symptoms and signs: Secondary | ICD-10-CM | POA: Diagnosis not present

## 2019-04-14 DIAGNOSIS — Z20822 Contact with and (suspected) exposure to covid-19: Secondary | ICD-10-CM

## 2019-04-14 MED ORDER — CEFDINIR 300 MG PO CAPS: 300.0000 mg | ORAL_CAPSULE | Freq: Two times a day (BID) | ORAL | 0 refills | Status: DC

## 2019-04-14 MED ORDER — BELSOMRA 10 MG PO TABS: 10.0000 mg | ORAL_TABLET | Freq: Every day | ORAL | 0 refills | Status: DC

## 2019-04-14 NOTE — Patient Instructions (Signed)
Please go to [facility], [place] for COVID testing. They are open M-F 8-3:30.  No appointment is needed. I will call you with your results.   In the meantime, increase fluids and try to get plenty of rest. Use saline nasal rinse to help with any nasal congestion and drainage. Giving the swollen tonsils and questionable pus noted on exam, I am starting you on an antibiotic. I have sent in a prescription for Cefdinir to the pharmacy. Take as directed with food.   Once we get your COVID test results back we can determine when we can get you in for further labs. I want you to contact your GI provider to reschedule your endoscopy.   I am trying to get your insurance to cover Trintellix to help for your mood since it has a low risk of side effects (taking into account your history!). I will let you know as soon as this is improved.   I have sent in a script for a 10-day trial of Belsomra for sleep. Please go to [email or web address] to download a voucher for the free trial. Let me know how this works for you.  I will do my best to keep you update every step of the way.  Hang in there!

## 2019-04-14 NOTE — Progress Notes (Signed)
Virtual Visit via Video   I connected with patient on 04/14/19 at  9:00 AM EDT by a video enabled telemedicine application and verified that I am speaking with the correct person using two identifiers.  Location patient: Home Location provider: [facility] Office Persons participating in the virtual visit: Patient, Provider, [place] (Patina Moore)  I discussed the limitations of evaluation and management by telemedicine and the availability of in person appointments. The patient expressed understanding and agreed to proceed.  Subjective:   HPI:   Patient presents today via doxy.need to discuss multiple issues.  Patient with longstanding history of recurring major depressive disorder, previously on a regimen of sertraline but notes coming off of this medication over the past month as it was causing headaches.  Notes resolution of headache since stopping the medicine.  Patient states she also felt it was not helping with her mood.  Patient endorses depressed mood and anhedonia most days of the week.  Notes some tearfulness and irritability.  Occasional anxiety, but this is more situational.  Notes issue with both sleep onset and sleep maintenance.  Notes she feels exhausted because she is only getting a couple hours of sleep per night.  Denies suicidal thought or ideation.  Does have good support system.  With this, patient notes excessive fatigue.  Notes she will wake up and have some energy but quickly loses it during normal tasks.  Patient does note anorexia.  Has history of chronic nausea for which she is followed by gastroenterology.  Has seen them recently and upper endoscopy was recommended but this was canceled and has not been rescheduled.  Patient denies unexplainable weight loss.  Denies night sweats.  Denies melena, hematochezia or tenesmus.  Denies change in urinary habits.  Denies snoring or apneic episodes mentioned by her spouse.  Patient also noting 2 to 3 days of  significant sore throat with odynophagia.  Denies fever but notes chills.  Notes her tonsils are quite swollen and she can see pus pockets when looking at her throat in the mirror.  Denies recent travel or sick contact, but she is around her grandchildren a lot.  ROS:   See pertinent positives and negatives per HPI.  Patient Active Problem List   Diagnosis Date Noted  . Chronic migraine 04/07/2018  . New onset headache 04/07/2018  . Sore throat 01/19/2018  . Moderate obstructive sleep apnea 05/21/2017  . Nausea 04/12/2017  . Vitamin D deficiency 07/17/2016  . Abdominal aortic atherosclerosis ([place]) 07/16/2016  . Renal artery stenosis ([place]) 06/12/2016  . Chronic low back pain 08/24/2015  . Memory loss 08/24/2015  . New onset of headaches after age 69 04/26/2015  . Headache 04/16/2015  . Hyperlipidemia, mixed 12/17/2014  . Preventative health care 09/11/2014  . Hepatic cyst 05/22/2014  . Insomnia 03/10/2014  . Low back pain 08/15/2013  . 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 Allergic state 12/31/2012  . Osteoporosis 09/07/2012  . 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 Mitral regurgitation 07/21/2011  . Gastroesophageal reflux disease with hiatal hernia 06/17/2011  . Endometrial hyperplasia   . Diverticulosis   . IBS (irritable bowel syndrome) 04/24/2011  . Barrett's esophagus 03/04/2011  . Hiatal hernia 03/04/2011  . BASAL CELL CARCINOMA, FACE 11/15/2009  . Migraine 11/15/2009  . WEIGHT GAIN 06/05/2009  . Hyperglycemia 06/05/2009  . Vitamin B12 deficiency 02/15/2009  . 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 Essential hypertension 01/04/2009  . ANXIETY DEPRESSION 12/14/2007    Social History  Tobacco Use  . Smoking status: Former Smoker    Quit date: 08/19/1995    Years since quitting: 23.6  . Smokeless tobacco: Never Used  Substance Use Topics  . Alcohol use: No    Alcohol/week: 0.0 standard drinks    Current Outpatient Medications:  .   albuterol (PROVENTIL) (2.5 MG/3ML) 0.083% nebulizer solution, INHALE THE CONTENTS OF 1 VIAL VIA NEBULIZER EVERY 6 HOURS AS NEEDED FOR WHEEZING  OR FOR SHORTNESS OF BREATH (Patient taking differently: Take 2.5 mg by nebulization every 4 (four) hours as needed for wheezing or shortness of breath. ), Disp: 90 mL, Rfl: 0 .  ALPRAZolam (XANAX) 1 MG tablet, TAKE 1 TABLET(1 MG) BY MOUTH TWICE DAILY AS NEEDED FOR ANXIETY, Disp: 30 tablet, Rfl: 0 .  aspirin EC 81 MG tablet, Take 1 tablet (81 mg total) by mouth daily., Disp: , Rfl:  .  Biotin 1000 MCG tablet, Take 1,000 mcg by mouth 3 (three) times daily., Disp: , Rfl:  .  carvedilol (COREG) 12.5 MG tablet, TAKE 1 TABLET EVERY DAY (Patient taking differently: Take 12.5 mg by mouth daily. ), Disp: 90 tablet, Rfl: 3 .  Cholecalciferol (VITAMIN D-3) 5000 units TABS, Take 1 tablet by mouth daily. (Patient taking differently: Take 5,000 Units by mouth daily. ), Disp: 30 tablet, Rfl: 0 .  dicyclomine (BENTYL) 10 MG capsule, Take 10 mg by mouth 3 (three) times daily as needed for spasms., Disp: , Rfl:  .  diltiazem (CARTIA XT) 180 MG 24 hr capsule, Take 1 capsule (180 mg total) by mouth daily., Disp: 90 capsule, Rfl: 3 .  dorzolamide-timolol (COSOPT) 22.3-6.8 MG/ML ophthalmic solution, Place 1 drop into both eyes every other day. , Disp: , Rfl:  .  fluticasone (FLONASE) 50 MCG/ACT nasal spray, SHAKE LIQUID AND USE 2 SPRAYS IN EACH NOSTRIL DAILY, Disp: 48 g, Rfl: 1 .  HYDROcodone-acetaminophen (NORCO) 5-325 MG tablet, Take 1 tablet by mouth every 6 (six) hours as needed for moderate pain., Disp: 30 tablet, Rfl: 0 .  HYDROcodone-acetaminophen (NORCO/VICODIN) 5-325 MG tablet, Take 1-2 tablets by mouth every 6 (six) hours as needed., Disp: 12 tablet, Rfl: 0 .  linaclotide (LINZESS) 72 MCG capsule, Take 72 mcg by mouth daily as needed. constipation, Disp: , Rfl:  .  ondansetron (ZOFRAN ODT) 4 MG disintegrating tablet, Take 1 tablet (4 mg total) by mouth every 8 (eight) hours  as needed., Disp: 20 tablet, Rfl: 6 .  promethazine (PHENERGAN) 25 MG tablet, TAKE 1 TABLET BY MOUTH THREE TIMES DAILY AS NEEDED FOR NAUSEA AND VOMITING (Patient taking differently: Take 25 mg by mouth every 8 (eight) hours as needed for nausea or vomiting. TAKE 1 TABLET BY MOUTH THREE TIMES DAILY AS NEEDED FOR NAUSEA AND VOMITING), Disp: 30 tablet, Rfl: 0 .  sertraline (ZOLOFT) 100 MG tablet, TAKE 1 TABLET(100 MG) BY MOUTH DAILY (Patient taking differently: Take 50 mg by mouth daily. ), Disp: 90 tablet, Rfl: 1 .  sucralfate (CARAFATE) 1 g tablet, Take 1 g by mouth 4 (four) times daily -  with meals and at bedtime., Disp: , Rfl:  .  tiZANidine (ZANAFLEX) 2 MG tablet, TAKE 1/2 TO 1 TABLET BY MOUTH EVERY 6 HOURS AS NEEDED FOR MUSCLE SPASMS, Disp: 30 tablet, Rfl: 0 .  vitamin E 400 UNIT capsule, Take 400 Units by mouth daily., Disp: , Rfl:   Allergies  Allergen Reactions  . Iodine-131 Other (See Comments)    Other reaction(s): Dizziness (intolerance)  . Sulfamethoxazole Shortness Of Breath  chest tightness  . Amitriptyline Anxiety and Other (See Comments)    Depression, insomnia  . Effexor [Venlafaxine]     palpitations  . Iohexol Other (See Comments)    Passed out  . Lactose Intolerance (Gi) Diarrhea and Nausea And Vomiting  . Lexapro [Escitalopram Oxalate]     nausea  . Iodinated Diagnostic Agents     Pt states at Dr.Grapeys office 15 years ago blacked out for 2 hours during injection for IVP. Pt says she had dye recently with no problems     Objective:   BP 129/68   Pulse 75   Temp 97.8 F (36.6 C) (Oral)   Wt 150 lb (68 kg)   BMI 24.21 kg/m   Patient is well-developed, well-nourished in no acute distress.  Resting comfortably at home.  Head is normocephalic, atraumatic.  No labored breathing.  Speech is clear and coherent with logical content.  Patient is alert and oriented at baseline.  Limited exam or oropharynx with use of lighting does show bilateral erythema and some  questionable patchiness over R tonsil. No uvular deviation noted on examination.   Assessment and Plan:   1. Pharyngitis due to Streptococcus species Concern for bacterial pharyngitis due to symptoms present.  Will Rx Cefdinir as she notes intolerability to Amoxicillin. Supportive measures and OTC medications reviewed with patient.    2. Chronic fatigue Suspect this is really due to her insomnia and significant recurring depression which we are working on. Giving current sore throat we will have her tested for COVID to make sure this is negative prior to having her come in for labs. Will obtain CBC, Thyroid studies, Vitamin levels and ESR to begin with.   3. Insomnia, unspecified type Sleep hygiene reviewed with patient. Start trial of Belsomra to help with sleep. Close follow-up scheduled.   4. Depression, recurrent ([place]) Has had multi-drug treatment failure due to subtherapeutic effects and side effects. Will attempt to get Trintellix approved to start a trial of this for mood. Handout given to MyChart on our counseling services so she can try to get this set up. Very close follow-up scheduled.     Leeanne Rio, PA-C 04/14/2019

## 2019-04-14 NOTE — Progress Notes (Signed)
I have discussed the procedure for the virtual visit with the patient who has given consent to proceed with assessment and treatment.   Khaleel Beckom S Janeice Stegall, CMA     

## 2019-04-15 ENCOUNTER — Ambulatory Visit: Payer: Medicare HMO | Admitting: Physician Assistant

## 2019-04-16 LAB — NOVEL CORONAVIRUS, NAA: SARS-CoV-2, NAA: NOT DETECTED

## 2019-04-18 ENCOUNTER — Encounter: Payer: Self-pay | Admitting: Physician Assistant

## 2019-04-18 ENCOUNTER — Other Ambulatory Visit: Payer: Self-pay | Admitting: Physician Assistant

## 2019-04-18 MED ORDER — VORTIOXETINE HBR 10 MG PO TABS: 10.0000 mg | ORAL_TABLET | Freq: Every day | ORAL | 2 refills | Status: DC

## 2019-04-20 ENCOUNTER — Encounter: Payer: Self-pay | Admitting: Physician Assistant

## 2019-04-21 ENCOUNTER — Other Ambulatory Visit: Payer: Self-pay | Admitting: Physician Assistant

## 2019-04-22 NOTE — Telephone Encounter (Signed)
Xanax last rx 03/23/19 #30 LOV: 04/14/19 CSC: 01/14/18 UDS: 11/05/18

## 2019-05-03 ENCOUNTER — Encounter: Payer: Self-pay | Admitting: Cardiovascular Disease

## 2019-05-03 NOTE — Telephone Encounter (Signed)
error 

## 2019-05-05 ENCOUNTER — Other Ambulatory Visit: Payer: Medicare HMO | Admitting: *Deleted

## 2019-05-05 ENCOUNTER — Other Ambulatory Visit: Payer: Self-pay

## 2019-05-05 ENCOUNTER — Encounter: Payer: Self-pay | Admitting: Physician Assistant

## 2019-05-05 DIAGNOSIS — E782 Mixed hyperlipidemia: Secondary | ICD-10-CM | POA: Diagnosis not present

## 2019-05-05 LAB — HEPATIC FUNCTION PANEL
ALT: 10 IU/L (ref 0–32)
AST: 14 IU/L (ref 0–40)
Albumin: 4.3 g/dL (ref 3.8–4.8)
Alkaline Phosphatase: 121 IU/L — ABNORMAL HIGH (ref 39–117)
Bilirubin Total: 0.2 mg/dL (ref 0.0–1.2)
Bilirubin, Direct: 0.08 mg/dL (ref 0.00–0.40)
Total Protein: 6.6 g/dL (ref 6.0–8.5)

## 2019-05-05 LAB — LIPID PANEL
Chol/HDL Ratio: 4.1 ratio (ref 0.0–4.4)
Cholesterol, Total: 203 mg/dL — ABNORMAL HIGH (ref 100–199)
HDL: 50 mg/dL (ref >39)
LDL Chol Calc (NIH): 135 mg/dL — ABNORMAL HIGH (ref 0–99)
Triglycerides: 102 mg/dL (ref 0–149)
VLDL Cholesterol Cal: 18 mg/dL (ref 5–40)

## 2019-05-11 DIAGNOSIS — R5383 Other fatigue: Secondary | ICD-10-CM | POA: Diagnosis not present

## 2019-05-16 ENCOUNTER — Telehealth: Payer: Self-pay | Admitting: Cardiovascular Disease

## 2019-05-16 NOTE — Telephone Encounter (Signed)
Spoke with pt for long time was hard to follow pt with symptoms.Per pt has noted for several months  weakness,fatigue, B/P up and down, 102/59 111/58 and 169/? And last few days SOB  Per pt had stopped Crestor back  In February pt thought it caused symptoms but pt is willing to restart and see if helps Pt thinks that the high cholesterol is causing weakness and SOB Will forward to Dr Burt Knack for review and recommendations./cy

## 2019-05-16 NOTE — Telephone Encounter (Signed)
  Pt c/o Shortness Of Breath: STAT if SOB developed within the last 24 hours or pt is noticeably SOB on the phone  1. Are you currently SOB (can you hear that pt is SOB on the phone)? yes  2. How long have you been experiencing SOB? Last 3 days  3. Are you SOB when sitting or when up moving around? Both,   4. Are you currently experiencing any other symptoms? Severe fatigue, fluctuating blood pressure readings, SOB has been coming and going but today it is not going away.

## 2019-05-17 ENCOUNTER — Telehealth: Payer: Self-pay | Admitting: Physician Assistant

## 2019-05-17 NOTE — Telephone Encounter (Signed)
Patient called and asked to go back on the Prozac even if she has to take anti-nausea medication. The new medicine is too expensive. Patient uses [facility] Patient also mentioned that her cardiologist has started her on Crestor.

## 2019-05-18 ENCOUNTER — Other Ambulatory Visit: Payer: Self-pay | Admitting: Emergency Medicine

## 2019-05-18 DIAGNOSIS — F339 Major depressive disorder, recurrent, unspecified: Secondary | ICD-10-CM

## 2019-05-18 MED ORDER — FLUOXETINE HCL 10 MG PO CAPS: 10.0000 mg | ORAL_CAPSULE | Freq: Every day | ORAL | 3 refills | Status: DC

## 2019-05-18 NOTE — Telephone Encounter (Signed)
Advised patient of PCP recommendations. She is agreeable with restarting Prozac 10 mg. Rx sent to the pharmacy. 1 month follow up scheduled.

## 2019-05-18 NOTE — Telephone Encounter (Signed)
Ok to restart Fluoxetine at 10 mg daily for 1 month. Let's make sure she tolerates well before increasing. Want her to follow-up in 3-4 weeks.

## 2019-05-24 ENCOUNTER — Telehealth: Payer: Self-pay | Admitting: Cardiovascular Disease

## 2019-05-24 NOTE — Telephone Encounter (Signed)
New Message   Patient sent a message requesting an appointment with Dr. Burt Knack. I am awaiting her reply to see if she would like to see a PA. In the message she also indicated that she is having severe fatigue. Please call to discuss the patients complaint of fatigue.

## 2019-05-24 NOTE — Telephone Encounter (Signed)
See 10/6 MyChart messages.  Scheduled the patient for evaluation with Buren Kos this Friday for fatigue and SOB. The patient was grateful for assistance.

## 2019-05-24 NOTE — Telephone Encounter (Signed)
See 10/6 MyChart messages.

## 2019-05-25 ENCOUNTER — Encounter: Payer: Self-pay | Admitting: Gynecology

## 2019-05-27 ENCOUNTER — Ambulatory Visit (INDEPENDENT_AMBULATORY_CARE_PROVIDER_SITE_OTHER): Payer: Medicare HMO | Admitting: Cardiology

## 2019-05-27 ENCOUNTER — Encounter: Payer: Self-pay | Admitting: Cardiology

## 2019-05-27 ENCOUNTER — Other Ambulatory Visit: Payer: Self-pay

## 2019-05-27 VITALS — BP 134/82 | HR 64 | Ht 66.0 in | Wt 149.8 lb

## 2019-05-27 DIAGNOSIS — R5383 Other fatigue: Secondary | ICD-10-CM

## 2019-05-27 DIAGNOSIS — I10 Essential (primary) hypertension: Secondary | ICD-10-CM

## 2019-05-27 DIAGNOSIS — E785 Hyperlipidemia, unspecified: Secondary | ICD-10-CM

## 2019-05-27 DIAGNOSIS — I071 Rheumatic tricuspid insufficiency: Secondary | ICD-10-CM | POA: Diagnosis not present

## 2019-05-27 MED ORDER — ROSUVASTATIN CALCIUM 10 MG PO TABS: 10.0000 mg | ORAL_TABLET | Freq: Every day | ORAL | 3 refills | Status: DC

## 2019-05-27 MED ORDER — CARVEDILOL 6.25 MG PO TABS: 6.2500 mg | ORAL_TABLET | Freq: Every day | ORAL | 3 refills | Status: DC

## 2019-05-27 NOTE — Patient Instructions (Signed)
Medication Instructions:  DECREASE: Carvedilol to 6.25 mg taking 1 tablet by mouth once a day   In one week if the top number of your blood pressure is running less than 130 you can decrease carvedilol to half a tablet (3.125) once a day    If you need a refill on your cardiac medications before your next appointment, please call your pharmacy.   Lab work: None   If you have labs (blood work) drawn today and your tests are completely normal, you will receive your results only by: Marland Kitchen MyChart Message (if you have MyChart) OR . A paper copy in the mail If you have any lab test that is abnormal or we need to change your treatment, we will call you to review the results.  Testing/Procedures: None   Follow-Up: You are scheduled to see Pecolia Ades, NP on 06/09/2019 @ 11:00 AM  Any Other Special Instructions Will Be Listed Below (If Applicable).  Monitor your blood pressure and keep a log of your recordings and bring that with you to your appointment on 06/09/2019

## 2019-05-27 NOTE — Progress Notes (Signed)
Cardiology Office Note:    Date:  05/27/2019   ID:  Megan [name], DOB 01-04-1951, MRN [id number]  PCP:  Brunetta Jeans, PA-C  Cardiologist:  Sherren Mocha, MD  Referring MD: Brunetta Jeans, PA-C   Chief Complaint  Patient presents with  . Fatigue    History of Present Illness:    Megan [name] is a 68 y.o. female with a past medical history significant for chest pain, heart palpitations and mild to moderate tricuspid regurgitation.  She has undergone serial echocardiogram studies and found to have normal LV and RV function, mild tricuspid regurgitation and normal pulmonary pressures.  Patient had normal nuclear stress test in 06/2016.  Patient is being seen today for complaints of shortness of breath and fatigue. She says that she is usually very active playing with her grand childrean and painting and other activities. Starting around the first of the year she started getting weak. With the pandemic she has not been getting out much and does not visit her grandchildren. She fell and broke her wrist on 01/17/19. She has continued to get more and more fatigued and has been sitting on the couch watching TV which is not usual for her. She has been stressed and down due to the pandemic. Her PCP felt that she may be depressed and she has been started on antidepressant. She says that she has a long history of "manic depression". She says that she is feeling so weak and this is not like her usual depression.   She gets worn out just walking around the church parking lot when her husband tries to get her to be more active. She says that her pulse is low- 61, and BP gets low 105/60. One night it was 80/52. Sometimes she gets sweaty. No orthopnea, PND or edema. No lightheadedness or syncope.  She says that her hair if falling out "by the handful"  She had an episode of being short of breath walking through the house, for 3 days 2 weeks ago, then it resolved. No current shortness of  breath.  She has mild pain in the epigastric area, tender when I palpate. She is having residual left shoulder pain after getting a nerve block for surgery on her hand in June.   Pt has been on carvedilol for HTN per her PCP from years ago. Pt cut it down from twice a day to once daily last year.   She had been off her Crestor since February but due to recent labs showing elevated cholesterol she restarted taking it on 05/17/2019. She needs a new Rx sent in.   She has recent labs per PCP to investigate her symptoms through [facility]. Hgb 13.3. WBC 7.3. TSH normal, 1.61. K+ 4.2, Na 140, Creat 0.56, albumin 4.2. Iron studies normal.   Cardiac studies   2D echocardiogram 08/17/2017: Study Conclusions  - Left ventricle: The cavity size was normal. There was mild focal basal hypertrophy of the septum. Systolic function was normal. Wall motion was normal; there were no regional wall motion abnormalities. Left ventricular diastolic function parameters were normal. - Aortic valve: Transvalvular velocity was within the normal range. There was no stenosis. There was no regurgitation. - Mitral valve: Transvalvular velocity was within the normal range. There was no evidence for stenosis. There was trivial regurgitation. - Right ventricle: The cavity size was normal. Wall thickness was normal. Systolic function was normal. - Atrial septum: No defect or patent foramen ovale was identified by color flow Doppler. -  Tricuspid valve: There was mild regurgitation. - Pulmonary arteries: Systolic pressure was within the normal range. PA peak pressure: 34 mm Hg (S).  Past Medical History:  Diagnosis Date  . 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 Allergic state 12/31/2012  . Anxiety   . Asthma   . Barrett esophagus   . Blind loop syndrome   . C. difficile diarrhea   . Cancer ([place])   . Chest pain, atypical 07/14/2011  . Cold sore 07/17/2016  . Colon polyp    . Contact dermatitis 03/23/2012  . Cystocele, midline   . Depression   . Diarrhea   . Diverticulosis   . Endometrial hyperplasia 02/27/2006   BENIGN ENDO BX ON 02/2007  . GERD (gastroesophageal reflux disease)   . Headache 04/16/2015  . Headache   . Headache(784.0) 04/15/2012  . Hematuria 04/27/2012  . Hepatic cyst   . Hiatal hernia   . Hiatal hernia   . Hyperlipidemia, mixed 12/17/2014  . Hypertension   . IBS (irritable bowel syndrome)   . Insomnia   . Leaky heart valve   . Low back pain 08/15/2013  . Osteoporosis 03/2017   T score -2.5. Without significant loss from prior studies  . 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 Paronychia of great toe, left 06/19/2013  . Preventative health care 09/11/2014  . Rectal bleeding 10/10/2011  . Rectocele   . 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 Sinusitis acute 02/03/2012  . Thrush 07/21/2011  . 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 Unspecified hypothyroidism   . Unspecified menopausal and postmenopausal disorder   . Uterine prolapse without mention of vaginal wall prolapse   . Vitamin B12 deficiency   . Vitamin D deficiency 07/17/2016    Past Surgical History:  Procedure Laterality Date  . APPENDECTOMY    . endoscopic hemoclip    . eye surgery Left    lens implant  . HYSTEROSCOPY     POLYP  . OPEN REDUCTION INTERNAL FIXATION (ORIF) DISTAL RADIAL FRACTURE Left 01/17/2019   Procedure: LEFT OPEN REDUCTION INTERNAL FIXATION (ORIF) DISTAL RADIUS FRACTURE;  Surgeon: Daryll Brod, MD;  Location: [facility];  Service: Orthopedics;  Laterality: Left;  AXILLARY BLOCK  . PERIPHERAL VASCULAR CATHETERIZATION Left 07/24/2016   Procedure: Renal Angiography;  Surgeon: Conrad Toughkenamon, MD;  Location: [facility];  Service: Cardiovascular;  Laterality: Left;  . UPPER GI ENDOSCOPY    . UTERINE FIBROID SURGERY      Current Medications: Current Meds  Medication Sig  . albuterol (PROVENTIL) (2.5 MG/3ML) 0.083% nebulizer solution INHALE THE  CONTENTS OF 1 VIAL VIA NEBULIZER EVERY 6 HOURS AS NEEDED FOR WHEEZING  OR FOR SHORTNESS OF BREATH  . ALPRAZolam (XANAX) 1 MG tablet TAKE 1 TABLET(1 MG) BY MOUTH TWICE DAILY AS NEEDED FOR ANXIETY  . aspirin EC 81 MG tablet Take 1 tablet (81 mg total) by mouth daily.  . Biotin 1000 MCG tablet Take 1,000 mcg by mouth 3 (three) times daily.  . Cholecalciferol (VITAMIN D-3) 5000 units TABS Take 1 tablet by mouth daily.  . Cyanocobalamin (VITAMIN B-12 PO) Take 1 tablet by mouth daily.  Marland Kitchen diltiazem (CARTIA XT) 180 MG 24 hr capsule Take 1 capsule (180 mg total) by mouth daily.  Marland Kitchen FLUoxetine (PROZAC) 10 MG capsule Take 1 capsule (10 mg total) by mouth daily.  . fluticasone (FLONASE) 50 MCG/ACT nasal spray SHAKE LIQUID AND USE 2 SPRAYS IN EACH NOSTRIL DAILY  . linaclotide (LINZESS) 72 MCG  capsule Take 72 mcg by mouth daily as needed. constipation  . Multiple Minerals (CALCIUM/MAGNESIUM/ZINC PO) Take 1 tablet by mouth daily.  . ondansetron (ZOFRAN ODT) 4 MG disintegrating tablet Take 1 tablet (4 mg total) by mouth every 8 (eight) hours as needed.  . promethazine (PHENERGAN) 25 MG tablet TAKE 1 TABLET BY MOUTH THREE TIMES DAILY AS NEEDED FOR NAUSEA AND VOMITING  . rosuvastatin (CRESTOR) 10 MG tablet Take 1 tablet (10 mg total) by mouth daily.  . sucralfate (CARAFATE) 1 g tablet Take 1 g by mouth 4 (four) times daily -  with meals and at bedtime.  Marland Kitchen tiZANidine (ZANAFLEX) 2 MG tablet TAKE 1/2 TO 1 TABLET BY MOUTH EVERY 6 HOURS AS NEEDED FOR MUSCLE SPASMS  . vitamin E 400 UNIT capsule Take 400 Units by mouth daily.  . [DISCONTINUED] carvedilol (COREG) 12.5 MG tablet TAKE 1 TABLET EVERY DAY  . [DISCONTINUED] rosuvastatin (CRESTOR) 10 MG tablet Take 10 mg by mouth daily.     Allergies:   Iodine-131, Sulfamethoxazole, Amitriptyline, Effexor [venlafaxine], Iohexol, Lactose intolerance (gi), Lexapro [escitalopram oxalate], and Iodinated diagnostic agents   Social History   Socioeconomic History  . Marital  status: Married    Spouse name: Not on file  . Number of children: 4  . Years of education: 29  . Highest education level: High school graduate  Occupational History  . Occupation: Retired  Scientific laboratory technician  . Financial resource strain: Not on file  . Food insecurity    Worry: Not on file    Inability: Not on file  . Transportation needs    Medical: Not on file    Non-medical: Not on file  Tobacco Use  . Smoking status: Former Smoker    Quit date: 08/19/1995    Years since quitting: 23.7  . Smokeless tobacco: Never Used  Substance and Sexual Activity  . Alcohol use: No    Alcohol/week: 0.0 standard drinks  . Drug use: No    Types: Hydrocodone  . Sexual activity: Not Currently    Birth control/protection: Post-menopausal    Comment: 1st intercourse 81 yo-5 partners  Lifestyle  . Physical activity    Days per week: Not on file    Minutes per session: Not on file  . Stress: Not on file  Relationships  . Social Herbalist on phone: Not on file    Gets together: Not on file    Attends religious service: Not on file    Active member of club or organization: Not on file    Attends meetings of clubs or organizations: Not on file    Relationship status: Not on file  Other Topics Concern  . Not on file  Social History Narrative   Lives at home with her husband.   Right-handed.   2 cups caffeine per day.     Family History: The patient's family history includes Alcohol abuse in her daughter; Allergies in her sister, son, and son; Aneurysm in her paternal grandfather; Arthritis in her brother, son, son, and son; Breast cancer (age of onset: 5) in her paternal grandmother; Breast cancer (age of onset: 17) in her maternal grandmother; Colon cancer in her cousin, maternal aunt, maternal grandmother, and mother; Colon polyps in her father; Diabetes in her mother and paternal grandmother; Heart disease in her paternal grandfather; Hypertension in her mother, paternal  grandfather, and son; Irritable bowel syndrome in her son; Osteoporosis in her son and son; Ovarian cancer in her cousin; Parkinson's disease  in her father. ROS:   Please see the history of present illness.     All other systems reviewed and are negative.   EKG:  EKG is ordered today.  The ekg ordered today demonstrates Sinus rhythm, incomplete RBBB, 64 bpm  Recent Labs: 05/05/2019: ALT 10   Recent Lipid Panel    Component Value Date/Time   CHOL 203 (H) 05/05/2019 0829   TRIG 102 05/05/2019 0829   HDL 50 05/05/2019 0829   CHOLHDL 4.1 05/05/2019 0829   CHOLHDL 4 07/17/2016 1513   VLDL 23.0 07/17/2016 1513   LDLCALC 135 (H) 05/05/2019 0829   LDLDIRECT 165.0 06/30/2011 1153    Physical Exam:    VS:  BP 134/82   Pulse 64   Ht 5\' 6"  (1.676 m)   Wt 149 lb 12.8 oz (67.9 kg)   SpO2 97%   BMI 24.18 kg/m     Wt Readings from Last 6 Encounters:  05/27/19 149 lb 12.8 oz (67.9 kg)  04/14/19 150 lb (68 kg)  01/17/19 155 lb 6.8 oz (70.5 kg)  01/07/19 150 lb (68 kg)  10/18/18 156 lb (70.8 kg)  09/23/18 158 lb (71.7 kg)     Physical Exam  Constitutional: She is oriented to person, place, and time. She appears well-developed and well-nourished. No distress.  HENT:  Head: Normocephalic and atraumatic.  Neck: Normal range of motion. Neck supple. No JVD present.  Cardiovascular: Normal rate, regular rhythm, normal heart sounds and intact distal pulses. Exam reveals no gallop and no friction rub.  No murmur heard. Pulmonary/Chest: Effort normal and breath sounds normal. No respiratory distress. She has no wheezes. She has no rales.  Abdominal: Soft. Bowel sounds are normal.  Musculoskeletal: Normal range of motion.        General: No edema.  Neurological: She is alert and oriented to person, place, and time.  Skin: Skin is warm and dry.  Psychiatric: She has a normal mood and affect. Her behavior is normal. Judgment and thought content normal.  Vitals reviewed.    ASSESSMENT:     1. Fatigue, unspecified type   2. Tricuspid valve insufficiency, unspecified etiology   3. Essential (primary) hypertension   4. Hyperlipidemia, unspecified hyperlipidemia type    PLAN:    In order of problems listed above:  Fatigue -Pt with generalized weakness and fatigue since the first of the year.  Patient is also quite down in the setting of the pandemic.  No exertional chest pain.  She had some brief exertional dyspnea for 3 days but this resolved.  Recently started on antidepressant, not yet able to tell if benefit. -PCP has also been investigating. Labs have not revealed any source. Thyroid function normal, not anemic.  Possibly related to depression.  Patient has been started on an antidepressant. -Patient also reports a perceived low heart rate and blood pressure.  She is taking carvedilol 12.5 mg for hypertension, just once a day.  She has been on this for many years.  This may be contributing to her symptoms as may be causing low HR and BP. We will reduce the dose by half and try to wean her off. I will see her back in the office in a few weeks. Hopefully we can stop BB altogether. If BP goes up, can consider another agent.  -If discontinuing beta-blocker does not help her symptoms, will consider repeating a Myoview.  Tricuspid valve insufficiency -mild over serial echos, last in 07/2017  Hypertension -On carvedilol 12.5 mg daily,  diltiazem 180 mg daily. Watch bp with discontinuation of carvedilol.  Hyperlipidemia -Incidental aortic atherosclerosis seen on chest CT -Patient has been off of her statin since February, restarted it recently as her LDL is 135. -Recheck lipid panel 8 weeks after restarting Crestor.  New prescription for Crestor sent in.    Medication Adjustments/Labs and Tests Ordered: Current medicines are reviewed at length with the patient today.  Concerns regarding medicines are outlined above. Labs and tests ordered and medication changes are outlined in  the patient instructions below:  Patient Instructions  Medication Instructions:  DECREASE: Carvedilol to 6.25 mg taking 1 tablet by mouth once a day   In one week if the top number of your blood pressure is running less than 130 you can decrease carvedilol to half a tablet (3.125) once a day    If you need a refill on your cardiac medications before your next appointment, please call your pharmacy.   Lab work: None   If you have labs (blood work) drawn today and your tests are completely normal, you will receive your results only by: Marland Kitchen MyChart Message (if you have MyChart) OR . A paper copy in the mail If you have any lab test that is abnormal or we need to change your treatment, we will call you to review the results.  Testing/Procedures: None   Follow-Up: You are scheduled to see Pecolia Ades, NP on 06/09/2019 @ 11:00 AM  Any Other Special Instructions Will Be Listed Below (If Applicable).  Monitor your blood pressure and keep a log of your recordings and bring that with you to your appointment on 06/09/2019    Signed, Daune Perch, NP  05/27/2019 7:11 PM    [place]

## 2019-05-30 NOTE — Addendum Note (Signed)
Addended by: Jones Broom on: 05/30/2019 10:19 AM   Modules accepted: Orders

## 2019-06-09 ENCOUNTER — Encounter: Payer: Self-pay | Admitting: Cardiology

## 2019-06-09 ENCOUNTER — Ambulatory Visit (INDEPENDENT_AMBULATORY_CARE_PROVIDER_SITE_OTHER): Payer: Medicare HMO | Admitting: Cardiology

## 2019-06-09 ENCOUNTER — Other Ambulatory Visit: Payer: Self-pay

## 2019-06-09 VITALS — BP 138/74 | HR 71 | Ht 66.0 in | Wt 149.1 lb

## 2019-06-09 DIAGNOSIS — R5383 Other fatigue: Secondary | ICD-10-CM

## 2019-06-09 DIAGNOSIS — I071 Rheumatic tricuspid insufficiency: Secondary | ICD-10-CM

## 2019-06-09 DIAGNOSIS — E782 Mixed hyperlipidemia: Secondary | ICD-10-CM

## 2019-06-09 DIAGNOSIS — I10 Essential (primary) hypertension: Secondary | ICD-10-CM | POA: Diagnosis not present

## 2019-06-09 NOTE — Progress Notes (Signed)
Cardiology Office Note:    Date:  06/09/2019   ID:  Megan [name], DOB 15-Apr-1951, MRN [id number]  PCP:  Brunetta Jeans, PA-C  Cardiologist:  Sherren Mocha, MD  Referring MD: Brunetta Jeans, PA-C   Chief Complaint  Patient presents with  . Follow-up    shortness of breath, fatigue    History of Present Illness:    Megan [name] is a 68 y.o. female with a past medical history significant for chest pain, heart palpitations and mild to moderate tricuspid regurgitation.  She has undergone serial echocardiogram studies and found to have normal LV and RV function, mild tricuspid regurgitation and normal pulmonary pressures.  Patient had normal nuclear stress test in 06/2016.  I saw her in the office on 05/27/2019 at which time she complained of shortness of breath and fatigue that began around the first of the year.  She admitted that due to the pandemic she was not getting out much and was not visiting her grandchildren which she usually spent a lot of time with.  She also felt that she was having some depression.  Labs were reviewed and unrevealing as a source of her fatigue.  She was also taking carvedilol for hypertension but was having some episodes of low blood pressure and perceived low heart rate.  The patient was advised to cut her carvedilol in half and wean down to that hopefully she can stop this.  Today Megan [name] is here alone for follow-up.  She feels more energy, not quite back to normal, but much better. BP is better mostly 110's-130. HR in the 60's. She feels like she had been wiped out and is "just now waking up from a coma". She feels like the prozac is also helping.   One of her great grandsons has been diagnosed with autism. This is stressing her.    Cardiac studies   2D echocardiogram 08/17/2017: Study Conclusions  - Left ventricle: The cavity size was normal. There was mild focal basal hypertrophy of the septum. Systolic function was  normal. Wall motion was normal; there were no regional wall motion abnormalities. Left ventricular diastolic function parameters were normal. - Aortic valve: Transvalvular velocity was within the normal range. There was no stenosis. There was no regurgitation. - Mitral valve: Transvalvular velocity was within the normal range. There was no evidence for stenosis. There was trivial regurgitation. - Right ventricle: The cavity size was normal. Wall thickness was normal. Systolic function was normal. - Atrial septum: No defect or patent foramen ovale was identified by color flow Doppler. - Tricuspid valve: There was mild regurgitation. - Pulmonary arteries: Systolic pressure was within the normal range. PA peak pressure: 34 mm Hg (S).   Past Medical History:  Diagnosis Date  . 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 Allergic state 12/31/2012  . Anxiety   . Asthma   . Barrett esophagus   . Blind loop syndrome   . C. difficile diarrhea   . Cancer ([place])   . Chest pain, atypical 07/14/2011  . Cold sore 07/17/2016  . Colon polyp   . Contact dermatitis 03/23/2012  . Cystocele, midline   . Depression   . Diarrhea   . Diverticulosis   . Endometrial hyperplasia 02/27/2006   BENIGN ENDO BX ON 02/2007  . GERD (gastroesophageal reflux disease)   . Headache 04/16/2015  . Headache   . Headache(784.0) 04/15/2012  . Hematuria 04/27/2012  . Hepatic cyst   .  Hiatal hernia   . Hiatal hernia   . Hyperlipidemia, mixed 12/17/2014  . Hypertension   . IBS (irritable bowel syndrome)   . Insomnia   . Leaky heart valve   . Low back pain 08/15/2013  . Osteoporosis 03/2017   T score -2.5. Without significant loss from prior studies  . 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 Paronychia of great toe, left 06/19/2013  . Preventative health care 09/11/2014  . Rectal bleeding 10/10/2011  . Rectocele   . 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 Sinusitis acute 02/03/2012  .  Thrush 07/21/2011  . 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 Unspecified hypothyroidism   . Unspecified menopausal and postmenopausal disorder   . Uterine prolapse without mention of vaginal wall prolapse   . Vitamin B12 deficiency   . Vitamin D deficiency 07/17/2016    Past Surgical History:  Procedure Laterality Date  . APPENDECTOMY    . endoscopic hemoclip    . eye surgery Left    lens implant  . HYSTEROSCOPY     POLYP  . OPEN REDUCTION INTERNAL FIXATION (ORIF) DISTAL RADIAL FRACTURE Left 01/17/2019   Procedure: LEFT OPEN REDUCTION INTERNAL FIXATION (ORIF) DISTAL RADIUS FRACTURE;  Surgeon: Daryll Brod, MD;  Location: [facility];  Service: Orthopedics;  Laterality: Left;  AXILLARY BLOCK  . PERIPHERAL VASCULAR CATHETERIZATION Left 07/24/2016   Procedure: Renal Angiography;  Surgeon: Conrad [place], MD;  Location: [facility];  Service: Cardiovascular;  Laterality: Left;  . UPPER GI ENDOSCOPY    . UTERINE FIBROID SURGERY      Current Medications: Current Meds  Medication Sig  . albuterol (PROVENTIL) (2.5 MG/3ML) 0.083% nebulizer solution INHALE THE CONTENTS OF 1 VIAL VIA NEBULIZER EVERY 6 HOURS AS NEEDED FOR WHEEZING  OR FOR SHORTNESS OF BREATH  . ALPRAZolam (XANAX) 1 MG tablet TAKE 1 TABLET(1 MG) BY MOUTH TWICE DAILY AS NEEDED FOR ANXIETY  . aspirin EC 81 MG tablet Take 1 tablet (81 mg total) by mouth daily.  . Biotin 1000 MCG tablet Take 1,000 mcg by mouth 3 (three) times daily.  . Cholecalciferol (VITAMIN D-3) 5000 units TABS Take 1 tablet by mouth daily.  . Cyanocobalamin (VITAMIN B-12 PO) Take 1 tablet by mouth daily.  Marland Kitchen diltiazem (CARTIA XT) 180 MG 24 hr capsule Take 1 capsule (180 mg total) by mouth daily.  Marland Kitchen FLUoxetine (PROZAC) 10 MG capsule Take 1 capsule (10 mg total) by mouth daily.  . fluticasone (FLONASE) 50 MCG/ACT nasal spray SHAKE LIQUID AND USE 2 SPRAYS IN EACH NOSTRIL DAILY  . linaclotide (LINZESS) 72 MCG capsule Take 72 mcg by mouth daily as  needed. constipation  . Multiple Minerals (CALCIUM/MAGNESIUM/ZINC PO) Take 1 tablet by mouth daily.  . ondansetron (ZOFRAN) 4 MG tablet TAKE 1 TABLET BY MOUTH EVERY 8 HOURS AS NEEDED FOR NAUSEA / VOMITING  . promethazine (PHENERGAN) 25 MG tablet TAKE 1 TABLET BY MOUTH THREE TIMES DAILY AS NEEDED FOR NAUSEA AND VOMITING  . rosuvastatin (CRESTOR) 10 MG tablet Take 1 tablet (10 mg total) by mouth daily.  . sucralfate (CARAFATE) 1 g tablet Take 1 g by mouth 4 (four) times daily -  with meals and at bedtime.  Marland Kitchen tiZANidine (ZANAFLEX) 2 MG tablet TAKE 1/2 TO 1 TABLET BY MOUTH EVERY 6 HOURS AS NEEDED FOR MUSCLE SPASMS  . vitamin E 400 UNIT capsule Take 400 Units by mouth daily.  . [DISCONTINUED] carvedilol (COREG) 6.25 MG tablet Take 1 tablet (6.25 mg total) by mouth daily.  Allergies:   Iodine-131, Sulfamethoxazole, Amitriptyline, Effexor [venlafaxine], Iohexol, Lactose intolerance (gi), Lexapro [escitalopram oxalate], and Iodinated diagnostic agents   Social History   Socioeconomic History  . Marital status: Married    Spouse name: Not on file  . Number of children: 4  . Years of education: 7  . Highest education level: High school graduate  Occupational History  . Occupation: Retired  Scientific laboratory technician  . Financial resource strain: Not on file  . Food insecurity    Worry: Not on file    Inability: Not on file  . Transportation needs    Medical: Not on file    Non-medical: Not on file  Tobacco Use  . Smoking status: Former Smoker    Quit date: 08/19/1995    Years since quitting: 23.8  . Smokeless tobacco: Never Used  Substance and Sexual Activity  . Alcohol use: No    Alcohol/week: 0.0 standard drinks  . Drug use: No    Types: Hydrocodone  . Sexual activity: Not Currently    Birth control/protection: Post-menopausal    Comment: 1st intercourse 39 yo-5 partners  Lifestyle  . Physical activity    Days per week: Not on file    Minutes per session: Not on file  . Stress: Not on  file  Relationships  . Social Herbalist on phone: Not on file    Gets together: Not on file    Attends religious service: Not on file    Active member of club or organization: Not on file    Attends meetings of clubs or organizations: Not on file    Relationship status: Not on file  Other Topics Concern  . Not on file  Social History Narrative   Lives at home with her husband.   Right-handed.   2 cups caffeine per day.     Family History: The patient's family history includes Alcohol abuse in her daughter; Allergies in her sister, son, and son; Aneurysm in her paternal grandfather; Arthritis in her brother, son, son, and son; Breast cancer (age of onset: 94) in her paternal grandmother; Breast cancer (age of onset: 24) in her maternal grandmother; Colon cancer in her cousin, maternal aunt, maternal grandmother, and mother; Colon polyps in her father; Diabetes in her mother and paternal grandmother; Heart disease in her paternal grandfather; Hypertension in her mother, paternal grandfather, and son; Irritable bowel syndrome in her son; Osteoporosis in her son and son; Ovarian cancer in her cousin; Parkinson's disease in her father. ROS:   Please see the history of present illness.     All other systems reviewed and are negative.   EKG:  EKG is not ordered today.   Recent Labs: 05/05/2019: ALT 10   Recent Lipid Panel    Component Value Date/Time   CHOL 203 (H) 05/05/2019 0829   TRIG 102 05/05/2019 0829   HDL 50 05/05/2019 0829   CHOLHDL 4.1 05/05/2019 0829   CHOLHDL 4 07/17/2016 1513   VLDL 23.0 07/17/2016 1513   LDLCALC 135 (H) 05/05/2019 0829   LDLDIRECT 165.0 06/30/2011 1153    Physical Exam:    VS:  BP 138/74   Pulse 71   Ht 5\' 6"  (1.676 m)   Wt 149 lb 1.9 oz (67.6 kg)   SpO2 97%   BMI 24.07 kg/m     Wt Readings from Last 6 Encounters:  06/09/19 149 lb 1.9 oz (67.6 kg)  05/27/19 149 lb 12.8 oz (67.9 kg)  04/14/19 150 lb (  68 kg)  01/17/19 155 lb 6.8  oz (70.5 kg)  01/07/19 150 lb (68 kg)  10/18/18 156 lb (70.8 kg)     Physical Exam  Constitutional: She is oriented to person, place, and time. She appears well-developed and well-nourished. No distress.  HENT:  Head: Normocephalic and atraumatic.  Neck: Normal range of motion. Neck supple. No JVD present.  Cardiovascular: Normal rate, regular rhythm, normal heart sounds and intact distal pulses. Exam reveals no gallop and no friction rub.  No murmur heard. Pulmonary/Chest: Effort normal and breath sounds normal. No respiratory distress. She has no wheezes. She has no rales.  Abdominal: Soft. Bowel sounds are normal.  Musculoskeletal: Normal range of motion.        General: No edema.  Neurological: She is alert and oriented to person, place, and time.  Skin: Skin is warm and dry.  Psychiatric: She has a normal mood and affect. Her behavior is normal. Judgment and thought content normal.  Vitals reviewed.   ASSESSMENT:    1. Fatigue, unspecified type   2. Tricuspid valve insufficiency, unspecified etiology   3. Essential (primary) hypertension   4. Hyperlipidemia, mixed    PLAN:    In order of problems listed above:  Fatigue -Labs reviewed at last visit 2 weeks ago were unrevealing for source of her fatigue.  Her PCP felt that depression may be playing a role and she was started on an antidepressant.  She thinks this is starting to help her. -Patient had been taking carvedilol once daily and had some hypotension and low heart rates.  We have decreased the carvedilol by half she feels much better, not quite back to normal but able to be more active in her daily life. Will continue to wean off, cutting the dose in half again for 4 days and then stopping.   Tricuspid valve insufficiency -mild over serial echos, last in 07/2017  Hypertension -On diltiazem 180 mg daily. Weaning off carvedilol and pt feeling much better. BP's currently well controlled.   Hyperlipidemia  -Incidental aortic atherosclerosis seen on chest CT -Patient had been off of her statin since February, restarted it recently as her LDL is 135. -Recheck lipid panel 8 weeks after restarting Crestor, will be done in early December.  Medication Adjustments/Labs and Tests Ordered: Current medicines are reviewed at length with the patient today.  Concerns regarding medicines are outlined above. Labs and tests ordered and medication changes are outlined in the patient instructions below:  Patient Instructions  Medication Instructions:  Take half a tablet of your carvedilol for 4 days then stop  *If you need a refill on your cardiac medications before your next appointment, please call your pharmacy*  Lab Work: FUTURE: LIPIDS ON 07/21/2019 (Our lab is open from 7:30 AM to 4:30 PM)  If you have labs (blood work) drawn today and your tests are completely normal, you will receive your results only by: Marland Kitchen MyChart Message (if you have MyChart) OR . A paper copy in the mail If you have any lab test that is abnormal or we need to change your treatment, we will call you to review the results.  Testing/Procedures: None  Follow-Up: At [facility], you and your health needs are our priority.  As part of our continuing mission to provide you with exceptional heart care, we have created designated Provider Care Teams.  These Care Teams include your primary Cardiologist (physician) and Advanced Practice Providers (APPs -  Physician Assistants and Nurse Practitioners) who all  work together to provide you with the care you need, when you need it.  Your next appointment:   6 months  The format for your next appointment:   In Person  Provider:   You may see Sherren Mocha, MD or one of the following Advanced Practice Providers on your designated Care Team:    Hitt Dopp, PA-C  Vin [place], [place]  Daune Perch, NP   Other Instructions      Signed, Daune Perch, NP  06/09/2019 5:11 PM     [place]

## 2019-06-09 NOTE — Patient Instructions (Signed)
Medication Instructions:  Take half a tablet of your carvedilol for 4 days then stop  *If you need a refill on your cardiac medications before your next appointment, please call your pharmacy*  Lab Work: FUTURE: LIPIDS ON 07/21/2019 (Our lab is open from 7:30 AM to 4:30 PM)  If you have labs (blood work) drawn today and your tests are completely normal, you will receive your results only by: Marland Kitchen MyChart Message (if you have MyChart) OR . A paper copy in the mail If you have any lab test that is abnormal or we need to change your treatment, we will call you to review the results.  Testing/Procedures: None  Follow-Up: At [facility], you and your health needs are our priority.  As part of our continuing mission to provide you with exceptional heart care, we have created designated Provider Care Teams.  These Care Teams include your primary Cardiologist (physician) and Advanced Practice Providers (APPs -  Physician Assistants and Nurse Practitioners) who all work together to provide you with the care you need, when you need it.  Your next appointment:   6 months  The format for your next appointment:   In Person  Provider:   You may see Sherren Mocha, MD or one of the following Advanced Practice Providers on your designated Care Team:    Sebring Dopp, PA-C  [place], [place]  Daune Perch, NP   Other Instructions

## 2019-06-10 ENCOUNTER — Encounter: Payer: Self-pay | Admitting: Physician Assistant

## 2019-06-13 ENCOUNTER — Encounter: Payer: Self-pay | Admitting: Physician Assistant

## 2019-06-17 ENCOUNTER — Encounter: Payer: Self-pay | Admitting: Physician Assistant

## 2019-06-17 ENCOUNTER — Other Ambulatory Visit: Payer: Self-pay

## 2019-06-17 ENCOUNTER — Ambulatory Visit (INDEPENDENT_AMBULATORY_CARE_PROVIDER_SITE_OTHER): Payer: Medicare HMO | Admitting: Physician Assistant

## 2019-06-17 VITALS — BP 134/73 | HR 71 | Temp 98.4°F

## 2019-06-17 DIAGNOSIS — F341 Dysthymic disorder: Secondary | ICD-10-CM | POA: Diagnosis not present

## 2019-06-17 MED ORDER — FLUOXETINE HCL 20 MG PO TABS: 20.0000 mg | ORAL_TABLET | Freq: Every day | ORAL | 3 refills | Status: DC

## 2019-06-17 MED ORDER — ALPRAZOLAM 1 MG PO TABS: ORAL_TABLET | ORAL | 1 refills | Status: DC

## 2019-06-17 MED ORDER — ALBUTEROL SULFATE HFA 108 (90 BASE) MCG/ACT IN AERS: 2.0000 | INHALATION_SPRAY | Freq: Four times a day (QID) | RESPIRATORY_TRACT | 0 refills | Status: DC | PRN

## 2019-06-17 NOTE — Patient Instructions (Signed)
Instructions sent to MyChart.   Please start the new dose of Fluoxetine (Prozac) once daily.  Continue Alprazolam as directed when needed. Keep being active as this is a good stress reliever.   Follow-up with me in 4-6 weeks (video/phone visit)  We will see you Monday for flu shot.

## 2019-06-17 NOTE — Progress Notes (Signed)
I have discussed the procedure for the virtual visit with the patient who has given consent to proceed with assessment and treatment.   Tamina Cyphers S Dhruti Ghuman, CMA     

## 2019-06-17 NOTE — Progress Notes (Signed)
Virtual Visit via Video   I connected with patient on 06/17/19 at  1:30 PM EDT by a video enabled telemedicine application and verified that I am speaking with the correct person using two identifiers.  Location patient: Home Location provider: [facility], Office Persons participating in the virtual visit: Patient, Provider, PA-Student Drucilla Schmidt),  CMA (Eduard Clos)  I discussed the limitations of evaluation and management by telemedicine and the availability of in person appointments. The patient expressed understanding and agreed to proceed.  Subjective:   HPI:   Patient presents via Doxy[employer]Me today for follow-up of depression and anxiety. At last visit, patient was restarted on her Prozac at 10 mg. Endorses taking as directed and tolerating well without side effect. Notes some improvement in mood but still noting depressive symptoms. Denies SI/HI. Was recently taken off of her Carvedilol by Cardiology with improvement in her energy.   ROS:   See pertinent positives and negatives per HPI.  Patient Active Problem List   Diagnosis Date Noted  . Closed fracture of right distal radius 01/12/2019  . Chronic migraine 04/07/2018  . New onset headache 04/07/2018  . Sore throat 01/19/2018  . Moderate obstructive sleep apnea 05/21/2017  . Nausea 04/12/2017  . Vitamin D deficiency 07/17/2016  . Abdominal aortic atherosclerosis ([place]) 07/16/2016  . Renal artery stenosis ([place]) 06/12/2016  . Chronic low back pain 08/24/2015  . Memory loss 08/24/2015  . New onset of headaches after age 65 04/26/2015  . Headache 04/16/2015  . Hyperlipidemia, mixed 12/17/2014  . Preventative health care 09/11/2014  . Hepatic cyst 05/22/2014  . Insomnia 03/10/2014  . Low back pain 08/15/2013  . 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 Allergic state 12/31/2012  . Osteoporosis 09/07/2012  . 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 Mitral regurgitation 07/21/2011   . Gastroesophageal reflux disease with hiatal hernia 06/17/2011  . Endometrial hyperplasia   . Diverticulosis   . IBS (irritable bowel syndrome) 04/24/2011  . Barrett's esophagus 03/04/2011  . Hiatal hernia 03/04/2011  . BASAL CELL CARCINOMA, FACE 11/15/2009  . Migraine 11/15/2009  . WEIGHT GAIN 06/05/2009  . Hyperglycemia 06/05/2009  . Vitamin B12 deficiency 02/15/2009  . 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 Essential hypertension 01/04/2009  . ANXIETY DEPRESSION 12/14/2007    Social History   Tobacco Use  . Smoking status: Former Smoker    Quit date: 08/19/1995    Years since quitting: 23.8  . Smokeless tobacco: Never Used  Substance Use Topics  . Alcohol use: No    Alcohol/week: 0.0 standard drinks    Current Outpatient Medications:  .  albuterol (PROVENTIL) (2.5 MG/3ML) 0.083% nebulizer solution, INHALE THE CONTENTS OF 1 VIAL VIA NEBULIZER EVERY 6 HOURS AS NEEDED FOR WHEEZING  OR FOR SHORTNESS OF BREATH, Disp: 90 mL, Rfl: 0 .  ALPRAZolam (XANAX) 1 MG tablet, TAKE 1 TABLET(1 MG) BY MOUTH TWICE DAILY AS NEEDED FOR ANXIETY, Disp: 30 tablet, Rfl: 1 .  aspirin EC 81 MG tablet, Take 1 tablet (81 mg total) by mouth daily., Disp: , Rfl:  .  Biotin 1000 MCG tablet, Take 1,000 mcg by mouth 3 (three) times daily., Disp: , Rfl:  .  Cholecalciferol (VITAMIN D-3) 5000 units TABS, Take 1 tablet by mouth daily., Disp: 30 tablet, Rfl: 0 .  Cyanocobalamin (VITAMIN B-12 PO), Take 1 tablet by mouth daily., Disp: , Rfl:  .  diltiazem (CARTIA XT) 180 MG 24 hr capsule, Take 1 capsule (180 mg total) by mouth daily.,  Disp: 90 capsule, Rfl: 3 .  FLUoxetine (PROZAC) 10 MG capsule, Take 1 capsule (10 mg total) by mouth daily., Disp: 30 capsule, Rfl: 3 .  fluticasone (FLONASE) 50 MCG/ACT nasal spray, SHAKE LIQUID AND USE 2 SPRAYS IN EACH NOSTRIL DAILY, Disp: 48 g, Rfl: 1 .  linaclotide (LINZESS) 72 MCG capsule, Take 72 mcg by mouth daily as needed. constipation, Disp: , Rfl:  .  Multiple Minerals  (CALCIUM/MAGNESIUM/ZINC PO), Take 1 tablet by mouth daily., Disp: , Rfl:  .  ondansetron (ZOFRAN) 4 MG tablet, TAKE 1 TABLET BY MOUTH EVERY 8 HOURS AS NEEDED FOR NAUSEA / VOMITING, Disp: , Rfl:  .  promethazine (PHENERGAN) 25 MG tablet, TAKE 1 TABLET BY MOUTH THREE TIMES DAILY AS NEEDED FOR NAUSEA AND VOMITING, Disp: 30 tablet, Rfl: 0 .  rosuvastatin (CRESTOR) 10 MG tablet, Take 1 tablet (10 mg total) by mouth daily., Disp: 90 tablet, Rfl: 3 .  sucralfate (CARAFATE) 1 g tablet, Take 1 g by mouth 4 (four) times daily -  with meals and at bedtime., Disp: , Rfl:  .  tiZANidine (ZANAFLEX) 2 MG tablet, TAKE 1/2 TO 1 TABLET BY MOUTH EVERY 6 HOURS AS NEEDED FOR MUSCLE SPASMS, Disp: 30 tablet, Rfl: 0 .  vitamin E 400 UNIT capsule, Take 400 Units by mouth daily., Disp: , Rfl:   Allergies  Allergen Reactions  . Iodine-131 Other (See Comments)    Other reaction(s): Dizziness (intolerance)  . Sulfamethoxazole Shortness Of Breath    chest tightness  . Amitriptyline Anxiety and Other (See Comments)    Depression, insomnia  . Effexor [Venlafaxine]     palpitations  . Iohexol Other (See Comments)    Passed out  . Lactose Intolerance (Gi) Diarrhea and Nausea And Vomiting  . Lexapro [Escitalopram Oxalate]     nausea  . Iodinated Diagnostic Agents     Pt states at Dr.Grapeys office 15 years ago blacked out for 2 hours during injection for IVP. Pt says she had dye recently with no problems     Objective:   There were no vitals taken for this visit.  Patient is well-developed, well-nourished in no acute distress.  Resting comfortably at home.  Head is normocephalic, atraumatic.  No labored breathing.  Speech is clear and coherent with logical content.  Patient is alert and oriented at baseline.   Assessment and Plan:   1. ANXIETY DEPRESSION Increase Prozac to 20 mg daily. Continue Alprazolam PRN. Follow-up 4-6 weeks via video visit.  - ALPRAZolam (XANAX) 1 MG tablet; TAKE 1 TABLET(1 MG) BY MOUTH  TWICE DAILY AS NEEDED FOR ANXIETY  Dispense: 30 tablet; Refill: 1 - FLUoxetine (PROZAC) 20 MG tablet; Take 1 tablet (20 mg total) by mouth daily.  Dispense: 30 tablet; Refill: 3    Leeanne Rio, [place] 06/17/2019

## 2019-06-20 ENCOUNTER — Other Ambulatory Visit: Payer: Self-pay

## 2019-06-20 ENCOUNTER — Ambulatory Visit (INDEPENDENT_AMBULATORY_CARE_PROVIDER_SITE_OTHER): Payer: Medicare HMO

## 2019-06-20 DIAGNOSIS — Z23 Encounter for immunization: Secondary | ICD-10-CM | POA: Diagnosis not present

## 2019-06-23 ENCOUNTER — Telehealth: Payer: Self-pay | Admitting: Emergency Medicine

## 2019-06-23 NOTE — Telephone Encounter (Signed)
Received a message from patient pharmacy [facility] [address] that her insurance was not covering the Prozac and they gave preferred medications (Celexa, Lexapro and Paxil).   LMOVM advising patient if she was able to pick up her rx for Prozac and that it didn't cost a lot.

## 2019-06-24 ENCOUNTER — Encounter: Payer: Self-pay | Admitting: Physician Assistant

## 2019-06-24 NOTE — Telephone Encounter (Signed)
Spoke with patient via [place]. She has medication and symptoms improving.

## 2019-07-21 ENCOUNTER — Other Ambulatory Visit: Payer: Self-pay | Admitting: Physician Assistant

## 2019-07-21 ENCOUNTER — Other Ambulatory Visit: Payer: Self-pay

## 2019-07-21 ENCOUNTER — Encounter: Payer: Self-pay | Admitting: Physician Assistant

## 2019-07-21 ENCOUNTER — Other Ambulatory Visit: Payer: Medicare HMO | Admitting: *Deleted

## 2019-07-21 DIAGNOSIS — Z1231 Encounter for screening mammogram for malignant neoplasm of breast: Secondary | ICD-10-CM

## 2019-07-21 DIAGNOSIS — E782 Mixed hyperlipidemia: Secondary | ICD-10-CM | POA: Diagnosis not present

## 2019-07-21 LAB — LIPID PANEL
Chol/HDL Ratio: 2.8 ratio (ref 0.0–4.4)
Cholesterol, Total: 142 mg/dL (ref 100–199)
HDL: 50 mg/dL
LDL Chol Calc (NIH): 70 mg/dL (ref 0–99)
Triglycerides: 124 mg/dL (ref 0–149)
VLDL Cholesterol Cal: 22 mg/dL (ref 5–40)

## 2019-07-22 NOTE — Telephone Encounter (Signed)
Please call patient to get scheduled for phone or in-office visit. See notes. Thank you.

## 2019-07-25 ENCOUNTER — Encounter: Payer: Self-pay | Admitting: Physician Assistant

## 2019-07-25 ENCOUNTER — Other Ambulatory Visit: Payer: Self-pay

## 2019-07-25 ENCOUNTER — Ambulatory Visit (INDEPENDENT_AMBULATORY_CARE_PROVIDER_SITE_OTHER): Payer: Medicare HMO | Admitting: Physician Assistant

## 2019-07-25 DIAGNOSIS — N644 Mastodynia: Secondary | ICD-10-CM | POA: Diagnosis not present

## 2019-07-25 NOTE — Progress Notes (Signed)
I have discussed the procedure for the virtual visit with the patient who has given consent to proceed with assessment and treatment.   Megan [name], CMA     

## 2019-07-25 NOTE — Progress Notes (Signed)
Virtual Visit via Video   I connected with patient on 07/25/19 at  3:30 PM EST by a video enabled telemedicine application and verified that I am speaking with the correct person using two identifiers.  Location patient: Home Location provider: [facility] Office Persons participating in the virtual visit: Patient, Provider, [place] (Patina Moore)  I discussed the limitations of evaluation and management by telemedicine and the availability of in person appointments. The patient expressed understanding and agreed to proceed.  Subjective:   HPI:   Patient presents via [facility] today c/o pain in L breast that is ongoing for her but increased in severity over the past couple of weeks. [facility] for her [facility]. On questioning she mentioned her pain and as such they recommended she speak with PCP as she would likely need more in-depth imaging. Notes pain is in the lower outer quadrant of the breast. Denies noting mass. Denies skin changes or tenderness/warmth of skin. Denies change in nipple or discharge from the nipple. Denies symptoms of R breast.  Last mammogram  07/07/2019 - negative.    ROS:   See pertinent positives and negatives per HPI.  Patient Active Problem List   Diagnosis Date Noted  . Closed fracture of right distal radius 01/12/2019  . Chronic migraine 04/07/2018  . New onset headache 04/07/2018  . Sore throat 01/19/2018  . Moderate obstructive sleep apnea 05/21/2017  . Nausea 04/12/2017  . Vitamin D deficiency 07/17/2016  . Abdominal aortic atherosclerosis ([place]) 07/16/2016  . Renal artery stenosis ([place]) 06/12/2016  . Chronic low back pain 08/24/2015  . Memory loss 08/24/2015  . New onset of headaches after age 22 04/26/2015  . Headache 04/16/2015  . Hyperlipidemia, mixed 12/17/2014  . Preventative health care 09/11/2014  . Hepatic cyst 05/22/2014  . Insomnia 03/10/2014  . Low back pain 08/15/2013  .  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 Allergic state 12/31/2012  . Osteoporosis 09/07/2012  . 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 Mitral regurgitation 07/21/2011  . Gastroesophageal reflux disease with hiatal hernia 06/17/2011  . Endometrial hyperplasia   . Diverticulosis   . IBS (irritable bowel syndrome) 04/24/2011  . Barrett's esophagus 03/04/2011  . Hiatal hernia 03/04/2011  . BASAL CELL CARCINOMA, FACE 11/15/2009  . Migraine 11/15/2009  . WEIGHT GAIN 06/05/2009  . Hyperglycemia 06/05/2009  . Vitamin B12 deficiency 02/15/2009  . 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 Essential hypertension 01/04/2009  . ANXIETY DEPRESSION 12/14/2007    Social History   Tobacco Use  . Smoking status: Former Smoker    Quit date: 08/19/1995    Years since quitting: 23.9  . Smokeless tobacco: Never Used  Substance Use Topics  . Alcohol use: No    Alcohol/week: 0.0 standard drinks    Current Outpatient Medications:  .  albuterol (VENTOLIN HFA) 108 (90 Base) MCG/ACT inhaler, Inhale 2 puffs into the lungs every 6 (six) hours as needed for wheezing or shortness of breath., Disp: 8 g, Rfl: 0 .  ALPRAZolam (XANAX) 1 MG tablet, TAKE 1 TABLET(1 MG) BY MOUTH TWICE DAILY AS NEEDED FOR ANXIETY, Disp: 30 tablet, Rfl: 1 .  aspirin EC 81 MG tablet, Take 1 tablet (81 mg total) by mouth daily., Disp: , Rfl:  .  Biotin 1000 MCG tablet, Take 1,000 mcg by mouth 3 (three) times daily., Disp: , Rfl:  .  Cholecalciferol (VITAMIN D-3) 5000 units TABS, Take 1 tablet by mouth daily.,  Disp: 30 tablet, Rfl: 0 .  Cyanocobalamin (VITAMIN B-12 PO), Take 1 tablet by mouth daily., Disp: , Rfl:  .  diltiazem (CARTIA XT) 180 MG 24 hr capsule, Take 1 capsule (180 mg total) by mouth daily., Disp: 90 capsule, Rfl: 3 .  FLUoxetine (PROZAC) 20 MG tablet, Take 1 tablet (20 mg total) by mouth daily., Disp: 30 tablet, Rfl: 3 .  fluticasone (FLONASE) 50 MCG/ACT nasal spray, SHAKE LIQUID AND USE 2 SPRAYS IN  EACH NOSTRIL DAILY, Disp: 48 g, Rfl: 1 .  hyoscyamine (LEVBID) 0.375 MG 12 hr tablet, Take 1 tablet by mouth as needed., Disp: , Rfl:  .  linaclotide (LINZESS) 72 MCG capsule, Take 72 mcg by mouth daily as needed. constipation, Disp: , Rfl:  .  Multiple Minerals (CALCIUM/MAGNESIUM/ZINC PO), Take 1 tablet by mouth daily., Disp: , Rfl:  .  ondansetron (ZOFRAN) 4 MG tablet, TAKE 1 TABLET BY MOUTH EVERY 8 HOURS AS NEEDED FOR NAUSEA / VOMITING, Disp: , Rfl:  .  promethazine (PHENERGAN) 25 MG tablet, TAKE 1 TABLET BY MOUTH THREE TIMES DAILY AS NEEDED FOR NAUSEA AND VOMITING, Disp: 30 tablet, Rfl: 0 .  rosuvastatin (CRESTOR) 10 MG tablet, Take 1 tablet (10 mg total) by mouth daily., Disp: 90 tablet, Rfl: 3 .  sucralfate (CARAFATE) 1 g tablet, Take 1 g by mouth 4 (four) times daily -  with meals and at bedtime., Disp: , Rfl:  .  tiZANidine (ZANAFLEX) 2 MG tablet, TAKE 1/2 TO 1 TABLET BY MOUTH EVERY 6 HOURS AS NEEDED FOR MUSCLE SPASMS, Disp: 30 tablet, Rfl: 0 .  vitamin E 400 UNIT capsule, Take 400 Units by mouth daily., Disp: , Rfl:   Allergies  Allergen Reactions  . Iodine-131 Other (See Comments)    Other reaction(s): Dizziness (intolerance)  . Sulfamethoxazole Shortness Of Breath    chest tightness  . Amitriptyline Anxiety and Other (See Comments)    Depression, insomnia  . Effexor [Venlafaxine]     palpitations  . Iohexol Other (See Comments)    Passed out  . Lactose Intolerance (Gi) Diarrhea and Nausea And Vomiting  . Lexapro [Escitalopram Oxalate]     nausea  . Iodinated Diagnostic Agents     Pt states at Dr.Grapeys office 15 years ago blacked out for 2 hours during injection for IVP. Pt says she had dye recently with no problems     Objective:   There were no vitals taken for this visit.  Patient is well-developed, well-nourished in no acute distress.  Resting comfortably at home.  Head is normocephalic, atraumatic.  No labored breathing.  Speech is clear and coherent with  logical content.  Patient is alert and oriented at baseline.   Assessment and Plan:   1. Breast pain, left Will obtain imaging as noted below to further assess. Tylenol for pain. Avoid caffeine. Needs in-office exam if anything worsens before imaging complete.  - US BREAST COMPLETE UNI RIGHT INC AXILLA; Future - US BREAST COMPLETE UNI LEFT INC AXILLA; Future - MM DIAG BREAST TOMO BILATERAL; Future    Leeanne Rio, PA-C 07/25/2019

## 2019-08-02 ENCOUNTER — Ambulatory Visit: Payer: Medicare HMO

## 2019-08-02 ENCOUNTER — Ambulatory Visit
Admission: RE | Admit: 2019-08-02 | Discharge: 2019-08-02 | Disposition: A | Discharge status: Home / Self Care | Payer: Medicare HMO | Source: Ambulatory Visit | Attending: Physician Assistant | Admitting: Physician Assistant

## 2019-08-02 ENCOUNTER — Other Ambulatory Visit: Payer: Self-pay

## 2019-08-02 DIAGNOSIS — N644 Mastodynia: Secondary | ICD-10-CM

## 2019-08-02 DIAGNOSIS — R928 Other abnormal and inconclusive findings on diagnostic imaging of breast: Secondary | ICD-10-CM | POA: Diagnosis not present

## 2019-08-04 DIAGNOSIS — Z20828 Contact with and (suspected) exposure to other viral communicable diseases: Secondary | ICD-10-CM | POA: Diagnosis not present

## 2019-08-04 MED ORDER — DILTIAZEM HCL ER COATED BEADS 180 MG PO CP24: 180.0000 mg | ORAL_CAPSULE | Freq: Every day | ORAL | 3 refills | Status: DC

## 2019-08-11 DIAGNOSIS — Z86012 Personal history of benign carcinoid tumor: Secondary | ICD-10-CM | POA: Diagnosis not present

## 2019-08-11 DIAGNOSIS — K219 Gastro-esophageal reflux disease without esophagitis: Secondary | ICD-10-CM | POA: Diagnosis not present

## 2019-08-11 DIAGNOSIS — Z8509 Personal history of malignant neoplasm of other digestive organs: Secondary | ICD-10-CM | POA: Diagnosis not present

## 2019-08-11 DIAGNOSIS — D3A010 Benign carcinoid tumor of the duodenum: Secondary | ICD-10-CM | POA: Diagnosis not present

## 2019-08-11 DIAGNOSIS — I361 Nonrheumatic tricuspid (valve) insufficiency: Secondary | ICD-10-CM | POA: Diagnosis not present

## 2019-08-11 DIAGNOSIS — K589 Irritable bowel syndrome without diarrhea: Secondary | ICD-10-CM | POA: Diagnosis not present

## 2019-08-11 DIAGNOSIS — K2950 Unspecified chronic gastritis without bleeding: Secondary | ICD-10-CM | POA: Diagnosis not present

## 2019-08-11 DIAGNOSIS — I10 Essential (primary) hypertension: Secondary | ICD-10-CM | POA: Diagnosis not present

## 2019-08-11 DIAGNOSIS — Z08 Encounter for follow-up examination after completed treatment for malignant neoplasm: Secondary | ICD-10-CM | POA: Diagnosis not present

## 2019-08-11 DIAGNOSIS — K449 Diaphragmatic hernia without obstruction or gangrene: Secondary | ICD-10-CM | POA: Diagnosis not present

## 2019-08-11 DIAGNOSIS — K2960 Other gastritis without bleeding: Secondary | ICD-10-CM | POA: Diagnosis not present

## 2019-08-11 DIAGNOSIS — E785 Hyperlipidemia, unspecified: Secondary | ICD-10-CM | POA: Diagnosis not present

## 2019-08-11 DIAGNOSIS — Z87891 Personal history of nicotine dependence: Secondary | ICD-10-CM | POA: Diagnosis not present

## 2019-08-15 ENCOUNTER — Other Ambulatory Visit: Payer: Self-pay | Admitting: Physician Assistant

## 2019-08-15 DIAGNOSIS — F341 Dysthymic disorder: Secondary | ICD-10-CM

## 2019-08-15 NOTE — Telephone Encounter (Signed)
Xanax last rx 06/17/2019 #30 1 RF LOV: 07/25/19 CSC:01/14/18 UDS:11/05/18

## 2019-08-24 ENCOUNTER — Telehealth: Payer: Medicare HMO | Admitting: Family

## 2019-08-24 DIAGNOSIS — R399 Unspecified symptoms and signs involving the genitourinary system: Secondary | ICD-10-CM

## 2019-08-24 MED ORDER — CEPHALEXIN 500 MG PO CAPS: 500.0000 mg | ORAL_CAPSULE | Freq: Two times a day (BID) | ORAL | 0 refills | Status: DC

## 2019-08-24 NOTE — Progress Notes (Signed)

## 2019-08-25 ENCOUNTER — Encounter: Payer: Self-pay | Admitting: Physician Assistant

## 2019-09-01 ENCOUNTER — Other Ambulatory Visit: Payer: Self-pay | Admitting: Physician Assistant

## 2019-09-01 ENCOUNTER — Other Ambulatory Visit: Payer: Self-pay | Admitting: Emergency Medicine

## 2019-09-01 DIAGNOSIS — F339 Major depressive disorder, recurrent, unspecified: Secondary | ICD-10-CM

## 2019-09-01 DIAGNOSIS — F341 Dysthymic disorder: Secondary | ICD-10-CM

## 2019-09-01 MED ORDER — FLUOXETINE HCL 20 MG PO TABS: 20.0000 mg | ORAL_TABLET | Freq: Every day | ORAL | 1 refills | Status: DC

## 2019-09-05 ENCOUNTER — Telehealth: Payer: Self-pay | Admitting: Cardiovascular Disease

## 2019-09-05 DIAGNOSIS — I071 Rheumatic tricuspid insufficiency: Secondary | ICD-10-CM

## 2019-09-05 NOTE — Telephone Encounter (Signed)
Patient is calling requesting an Echo be performed before her scheduled appointment on 12/12/19. Please advise.

## 2019-09-12 NOTE — Telephone Encounter (Signed)
The patient has scheduled her echo 4/19.

## 2019-09-12 NOTE — Telephone Encounter (Signed)
Per Dr. Burt Knack, echo ordered to reassess TR.  Left message for patient to call back to arrange echo prior to visit.

## 2019-09-14 ENCOUNTER — Other Ambulatory Visit: Payer: Self-pay | Admitting: Physician Assistant

## 2019-09-14 ENCOUNTER — Telehealth: Payer: Self-pay | Admitting: Physician Assistant

## 2019-09-14 DIAGNOSIS — F339 Major depressive disorder, recurrent, unspecified: Secondary | ICD-10-CM

## 2019-09-14 NOTE — Telephone Encounter (Signed)
D/C the 20 mg of Prozac and sent in refill of the Prozac 10 mg daily to the pharamcy

## 2019-09-14 NOTE — Telephone Encounter (Signed)
Pt is unable to tolerate the 20 mg Prozac and has successfully returned to the 10mg . Pharmacy filled 20 mg in error as she is out of 10 mg. Need refill of 10 mg to [facility]

## 2019-09-23 ENCOUNTER — Telehealth: Payer: Medicare HMO | Admitting: Physician Assistant

## 2019-09-23 DIAGNOSIS — J329 Chronic sinusitis, unspecified: Secondary | ICD-10-CM | POA: Diagnosis not present

## 2019-09-23 MED ORDER — AMOXICILLIN-POT CLAVULANATE 875-125 MG PO TABS: 1.0000 | ORAL_TABLET | Freq: Two times a day (BID) | ORAL | 0 refills | Status: DC

## 2019-09-23 NOTE — Progress Notes (Signed)
We are sorry that you are not feeling well.  I see you have a few sinus infections a year.  Please follow-up with your Primary Care Physician if your symptoms fail to improve after treatment of this infection.  And if you develop symptoms of a sinus infection in the next month or two, please see your PCP.  Imaging or further intervention may be required.   Based on what you have shared with me it looks like you have sinusitis.  Sinusitis is inflammation and infection in the sinus cavities of the head.  Based on your presentation I believe you most likely have Acute Bacterial Sinusitis.  This is an infection caused by bacteria and is treated with antibiotics. I have prescribed Augmentin 875mg /125mg  one tablet twice daily with food, for 7 days. You may use an oral decongestant such as Mucinex D or if you have glaucoma or high blood pressure use plain Mucinex. Saline nasal spray help and can safely be used as often as needed for congestion.  If you develop worsening sinus pain, fever or notice severe headache and vision changes, or if symptoms are not better after completion of antibiotic, please schedule an appointment with a health care provider.    Sinus infections are not as easily transmitted as other respiratory infection, however we still recommend that you avoid close contact with loved ones, especially the very young and elderly.  Remember to wash your hands thoroughly throughout the day as this is the number one way to prevent the spread of infection!  Home Care:  Only take medications as instructed by your medical team.  Complete the entire course of an antibiotic.  Do not take these medications with alcohol.  A steam or ultrasonic humidifier can help congestion.  You can place a towel over your head and breathe in the steam from hot water coming from a faucet.  Avoid close contacts especially the very young and the elderly.  Cover your mouth when you cough or sneeze.  Always remember to  wash your hands.  Get Help Right Away If:  You develop worsening fever or sinus pain.  You develop a severe head ache or visual changes.  Your symptoms persist after you have completed your treatment plan.  Make sure you  Understand these instructions.  Will watch your condition.  Will get help right away if you are not doing well or get worse.  Your e-visit answers were reviewed by a board certified advanced clinical practitioner to complete your personal care plan.  Depending on the condition, your plan could have included both over the counter or prescription medications.  If there is a problem please reply  once you have received a response from your provider.  Your safety is important to [place].  If you have drug allergies check your prescription carefully.    You can use MyChart to ask questions about today's visit, request a non-urgent call back, or ask for a work or school excuse for 24 hours related to this e-Visit. If it has been greater than 24 hours you will need to follow up with your provider, or enter a new e-Visit to address those concerns.  You will get an e-mail in the next two days asking about your experience.  I hope that your e-visit has been valuable and will speed your recovery. Thank you for using e-visits.   Greater than 5 minutes, yet less than 10 minutes of time have been spent researching, coordinating and implementing care for this  patient today.

## 2019-09-26 ENCOUNTER — Encounter: Payer: Self-pay | Admitting: Physician Assistant

## 2019-09-26 DIAGNOSIS — R0789 Other chest pain: Secondary | ICD-10-CM

## 2019-09-28 ENCOUNTER — Encounter: Payer: Self-pay | Admitting: Physician Assistant

## 2019-09-28 ENCOUNTER — Other Ambulatory Visit: Payer: Self-pay

## 2019-09-28 ENCOUNTER — Ambulatory Visit (HOSPITAL_BASED_OUTPATIENT_CLINIC_OR_DEPARTMENT_OTHER)
Admission: RE | Admit: 2019-09-28 | Discharge: 2019-09-28 | Disposition: A | Discharge status: Home / Self Care | Payer: Medicare HMO | Source: Ambulatory Visit | Attending: Physician Assistant | Admitting: Physician Assistant

## 2019-09-28 DIAGNOSIS — R079 Chest pain, unspecified: Secondary | ICD-10-CM | POA: Diagnosis not present

## 2019-09-28 DIAGNOSIS — R05 Cough: Secondary | ICD-10-CM | POA: Diagnosis not present

## 2019-09-28 DIAGNOSIS — R0789 Other chest pain: Secondary | ICD-10-CM | POA: Diagnosis not present

## 2019-10-13 ENCOUNTER — Other Ambulatory Visit: Payer: Self-pay | Admitting: Physician Assistant

## 2019-10-13 DIAGNOSIS — B9689 Other specified bacterial agents as the cause of diseases classified elsewhere: Secondary | ICD-10-CM

## 2019-10-13 DIAGNOSIS — F341 Dysthymic disorder: Secondary | ICD-10-CM

## 2019-10-13 NOTE — Telephone Encounter (Signed)
Last refill: 12.30.20 #30, 1 Last OV: 12.7.20 dx. Breast pain

## 2019-10-26 MED ORDER — DILTIAZEM HCL ER COATED BEADS 240 MG PO CP24: 240.0000 mg | ORAL_CAPSULE | Freq: Every day | ORAL | 3 refills | Status: DC

## 2019-11-01 DIAGNOSIS — Z20828 Contact with and (suspected) exposure to other viral communicable diseases: Secondary | ICD-10-CM | POA: Diagnosis not present

## 2019-11-02 DIAGNOSIS — R69 Illness, unspecified: Secondary | ICD-10-CM | POA: Diagnosis not present

## 2019-11-29 ENCOUNTER — Telehealth: Payer: Self-pay | Admitting: Physician Assistant

## 2019-11-29 NOTE — Progress Notes (Signed)
  Chronic Care Management   Outreach Note  11/29/2019 Name: Megan [name] MRN: [id number] DOB: July 08, 1951  Referred by: Brunetta Jeans, PA-C Reason for referral : No chief complaint on file.   An unsuccessful telephone outreach was attempted today. The patient was referred to the pharmacist for assistance with care management and care coordination.   Follow Up Plan:   [name] Upstream Scheduler

## 2019-12-05 ENCOUNTER — Other Ambulatory Visit: Payer: Self-pay

## 2019-12-05 ENCOUNTER — Ambulatory Visit (HOSPITAL_COMMUNITY): Payer: Medicare HMO | Attending: Cardiovascular Disease

## 2019-12-05 DIAGNOSIS — I071 Rheumatic tricuspid insufficiency: Secondary | ICD-10-CM | POA: Diagnosis not present

## 2019-12-06 ENCOUNTER — Telehealth: Payer: Self-pay | Admitting: Physician Assistant

## 2019-12-06 ENCOUNTER — Telehealth: Payer: Self-pay | Admitting: Radiology

## 2019-12-06 DIAGNOSIS — R000 Tachycardia, unspecified: Secondary | ICD-10-CM

## 2019-12-06 NOTE — Progress Notes (Signed)
°  Chronic Care Management   Note  12/06/2019 Name: Megan [name] MRN: [id number] DOB: Jun 30, 1951  Megan [name] is a 68 y.o. year old female who is a primary care patient of Delorse Limber. I reached out to Amie Portland by phone today in response to a referral sent by Megan [name]'s PCP, Brunetta Jeans, PA-C.   Megan [name] was given information about Chronic Care Management services today including:  1. CCM service includes personalized support from designated clinical staff supervised by her physician, including individualized plan of care and coordination with other care providers 2. 24/7 contact phone numbers for assistance for urgent and routine care needs. 3. Service will only be billed when office clinical staff spend 20 minutes or more in a month to coordinate care. 4. Only one practitioner may furnish and bill the service in a calendar month. 5. The patient may stop CCM services at any time (effective at the end of the month) by phone call to the office staff.   Patient agreed to services and verbal consent obtained.   Follow up plan:   [name] Upstream Scheduler

## 2019-12-06 NOTE — Telephone Encounter (Signed)
Enrolled patient for a 7 day Zio monitor to be mailed to patients home.  

## 2019-12-08 ENCOUNTER — Other Ambulatory Visit: Payer: Self-pay | Admitting: Physician Assistant

## 2019-12-08 DIAGNOSIS — B9689 Other specified bacterial agents as the cause of diseases classified elsewhere: Secondary | ICD-10-CM

## 2019-12-09 ENCOUNTER — Telehealth: Payer: Medicare HMO | Admitting: Physician Assistant

## 2019-12-09 ENCOUNTER — Encounter: Payer: Self-pay | Admitting: Physician Assistant

## 2019-12-09 DIAGNOSIS — N390 Urinary tract infection, site not specified: Secondary | ICD-10-CM

## 2019-12-09 DIAGNOSIS — M549 Dorsalgia, unspecified: Secondary | ICD-10-CM

## 2019-12-09 NOTE — Progress Notes (Signed)
Based on what you shared with me, I feel your condition warrants further evaluation and I recommend that you be seen for a face to face office visit.  Ms. Mereness,  Sorry you are not feeling well! Your urinary symptoms associated with back pian could indicate a kidney infection and this warrants a face to face evaluation of your symptoms.   NOTE: If you entered your credit card information for this eVisit, you will not be charged. You may see a "hold" on your card for the $35 but that hold will drop off and you will not have a charge processed.   If you are having a true medical emergency please call 911.      For an urgent face to face visit, [facility] has five urgent care centers for your convenience:      NEW:  [facility] [place] at  Get Driving Directions [id number] [place], [place] [address] . 10 am - 6pm Monday - Friday    [facility]) Get Driving Directions [id number] [address] [place], [place] [address] . 10 am to 8 pm Monday-Friday . 12 pm to 8 pm [facility] at [facility] Get Driving Directions [id number] [place], [place], [place] [address] . 8 am to 8 pm Monday-Friday . 9 am to 6 pm Saturday . 11 am to 6 pm Sunday     [facility] at [facility] Get Driving Directions  [id number] [address], [place] [address] . 8 am to 8 pm Monday-Friday . 8 am to 4 pm [facility] [place] Get Driving Directions [id number] [place], [place] [address] . 12 pm to 6 pm Monday-Friday      Your e-visit answers were reviewed by a board certified advanced clinical practitioner to complete your personal care plan.  Thank you for using e-Visits.    I spent 5-10 minutes on review and completion of this note- Lacy Duverney  [facility]

## 2019-12-12 ENCOUNTER — Other Ambulatory Visit (INDEPENDENT_AMBULATORY_CARE_PROVIDER_SITE_OTHER): Payer: Medicare HMO

## 2019-12-12 ENCOUNTER — Ambulatory Visit: Payer: Medicare HMO | Admitting: Cardiovascular Disease

## 2019-12-12 ENCOUNTER — Encounter: Payer: Self-pay | Admitting: Cardiovascular Disease

## 2019-12-12 ENCOUNTER — Other Ambulatory Visit: Payer: Self-pay

## 2019-12-12 VITALS — BP 140/70 | HR 70 | Ht 66.0 in | Wt 147.0 lb

## 2019-12-12 DIAGNOSIS — R000 Tachycardia, unspecified: Secondary | ICD-10-CM | POA: Diagnosis not present

## 2019-12-12 DIAGNOSIS — E782 Mixed hyperlipidemia: Secondary | ICD-10-CM | POA: Diagnosis not present

## 2019-12-12 DIAGNOSIS — I071 Rheumatic tricuspid insufficiency: Secondary | ICD-10-CM

## 2019-12-12 DIAGNOSIS — I10 Essential (primary) hypertension: Secondary | ICD-10-CM | POA: Diagnosis not present

## 2019-12-12 DIAGNOSIS — R002 Palpitations: Secondary | ICD-10-CM

## 2019-12-12 NOTE — Progress Notes (Signed)
Cardiology Office Note:    Date:  12/12/2019   ID:  Megan [name], DOB [birth year], MRN [id number]  PCP:  Brunetta Jeans, PA-C  Cardiologist:  Sherren Mocha, MD  Electrophysiologist:  None   Referring MD: Brunetta Jeans, PA-C   Chief Complaint  Patient presents with  . Palpitations    History of Present Illness:    Megan [name] is a 69 y.o. female with a hx of chest pain, heart palpitations, and mild to moderate tricuspid regurgitation.  She presents today for follow-up evaluation.  Last nuclear stress test in 2017 showed no ischemia.  LV function has been normal on serial echo studies.  The patient has had increased heart palpitations.  She has been under a lot of stress.  Palpitations occur primarily at rest and at night when she is trying to go to sleep.  There are no associated symptoms.  She denies shortness of breath with activity, orthopnea, PND, lightheadedness, or syncope.  She has occasional sharp chest pains that are fleeting and localized in the left chest.  No exertional chest pain.  She is  Past Medical History:  Diagnosis Date  . 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 Allergic state 12/31/2012  . Anxiety   . Asthma   . Barrett esophagus   . Blind loop syndrome   . C. difficile diarrhea   . Cancer ([place])   . Chest pain, atypical 07/14/2011  . Cold sore 07/17/2016  . Colon polyp   . Contact dermatitis 03/23/2012  . Cystocele, midline   . Depression   . Diarrhea   . Diverticulosis   . Endometrial hyperplasia 02/27/2006   BENIGN ENDO BX ON 02/2007  . GERD (gastroesophageal reflux disease)   . Headache 04/16/2015  . Headache   . Headache(784.0) 04/15/2012  . Hematuria 04/27/2012  . Hepatic cyst   . Hiatal hernia   . Hiatal hernia   . Hyperlipidemia, mixed 12/17/2014  . Hypertension   . IBS (irritable bowel syndrome)   . Insomnia   . Leaky heart valve   . Low back pain 08/15/2013  . Osteoporosis 03/2017   T  score -2.5. Without significant loss from prior studies  . 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 Paronychia of great toe, left 06/19/2013  . Preventative health care 09/11/2014  . Rectal bleeding 10/10/2011  . Rectocele   . 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 Sinusitis acute 02/03/2012  . Thrush 07/21/2011  . 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 Unspecified hypothyroidism   . Unspecified menopausal and postmenopausal disorder   . Uterine prolapse without mention of vaginal wall prolapse   . Vitamin B12 deficiency   . Vitamin D deficiency 07/17/2016    Past Surgical History:  Procedure Laterality Date  . APPENDECTOMY    . endoscopic hemoclip    . eye surgery Left    lens implant  . HYSTEROSCOPY     POLYP  . OPEN REDUCTION INTERNAL FIXATION (ORIF) DISTAL RADIAL FRACTURE Left 01/17/2019   Procedure: LEFT OPEN REDUCTION INTERNAL FIXATION (ORIF) DISTAL RADIUS FRACTURE;  Surgeon: Daryll Brod, MD;  Location: [facility];  Service: Orthopedics;  Laterality: Left;  AXILLARY BLOCK  . PERIPHERAL VASCULAR CATHETERIZATION Left 07/24/2016   Procedure: Renal Angiography;  Surgeon: Conrad Chili, MD;  Location: [facility];  Service: Cardiovascular;  Laterality: Left;  . UPPER GI ENDOSCOPY    . UTERINE FIBROID SURGERY      Current Medications: Current Meds  Medication Sig  . albuterol (VENTOLIN HFA) 108 (90 Base) MCG/ACT inhaler Inhale 2 puffs into the lungs every 6 (six) hours as needed for wheezing or shortness of breath.  . ALPRAZolam (XANAX) 1 MG tablet TAKE 1 TABLET BY MOUTH TWICE A DAY AS NEEDED FOR ANXIETY  . aspirin EC 81 MG tablet Take 1 tablet (81 mg total) by mouth daily.  . Biotin 1000 MCG tablet Take 1,000 mcg by mouth 3 (three) times daily.  . Cholecalciferol (VITAMIN D-3) 5000 units TABS Take 1 tablet by mouth daily.  . Cyanocobalamin (VITAMIN B-12 PO) Take 1 tablet by mouth daily.  Marland Kitchen diltiazem (CARTIA XT) 240 MG 24 hr capsule Take 1 capsule (240 mg total) by mouth daily.   . fluticasone (FLONASE) 50 MCG/ACT nasal spray SHAKE LIQUID AND USE 2 SPRAYS IN EACH NOSTRIL DAILY  . hyoscyamine (LEVBID) 0.375 MG 12 hr tablet Take 1 tablet by mouth as needed.  . linaclotide (LINZESS) 72 MCG capsule Take 72 mcg by mouth daily as needed. constipation  . Multiple Minerals (CALCIUM/MAGNESIUM/ZINC PO) Take 1 tablet by mouth daily.  . ondansetron (ZOFRAN) 4 MG tablet TAKE 1 TABLET BY MOUTH EVERY 8 HOURS AS NEEDED FOR NAUSEA / VOMITING  . promethazine (PHENERGAN) 25 MG tablet TAKE 1 TABLET BY MOUTH THREE TIMES DAILY AS NEEDED FOR NAUSEA AND VOMITING  . sucralfate (CARAFATE) 1 g tablet Take 1 g by mouth 4 (four) times daily -  with meals and at bedtime.  . vitamin E 400 UNIT capsule Take 400 Units by mouth daily.     Allergies:   Iodine-131, Sulfamethoxazole, Amitriptyline, Effexor [venlafaxine], Iohexol, Lactose intolerance (gi), Lexapro [escitalopram oxalate], and Iodinated diagnostic agents   Social History   Socioeconomic History  . Marital status: Married    Spouse name: Not on file  . Number of children: 4  . Years of education: 22  . Highest education level: High school graduate  Occupational History  . Occupation: Retired  Tobacco Use  . Smoking status: Former Smoker    Quit date: 08/19/1995    Years since quitting: 24.3  . Smokeless tobacco: Never Used  Substance and Sexual Activity  . Alcohol use: No    Alcohol/week: 0.0 standard drinks  . Drug use: No    Types: Hydrocodone  . Sexual activity: Not Currently    Birth control/protection: Post-menopausal    Comment: 1st intercourse 7 yo-5 partners  Other Topics Concern  . Not on file  Social History Narrative   Lives at home with her husband.   Right-handed.   2 cups caffeine per day.   Social Determinants of Health   Financial Resource Strain:   . Difficulty of Paying Living Expenses:   Food Insecurity:   . Worried About Charity fundraiser in the Last Year:   . Arboriculturist in the Last Year:     Transportation Needs:   . Film/video editor (Medical):   Marland Kitchen Lack of Transportation (Non-Medical):   Physical Activity:   . Days of Exercise per Week:   . Minutes of Exercise per Session:   Stress:   . Feeling of Stress :   Social Connections:   . Frequency of Communication with Friends and Family:   . Frequency of Social Gatherings with Friends and Family:   . Attends Religious Services:   . Active Member of Clubs or Organizations:   . Attends Archivist Meetings:   Marland Kitchen Marital Status:      Family  History: The patient's family history includes Alcohol abuse in her daughter; Allergies in her sister, son, and son; Aneurysm in her paternal grandfather; Arthritis in her brother, son, son, and son; Breast cancer (age of onset: 91) in her paternal grandmother; Breast cancer (age of onset: 68) in her maternal grandmother; Colon cancer in her cousin, maternal aunt, maternal grandmother, and mother; Colon polyps in her father; Diabetes in her mother and paternal grandmother; Heart disease in her paternal grandfather; Hypertension in her mother, paternal grandfather, and son; Irritable bowel syndrome in her son; Osteoporosis in her son and son; Ovarian cancer in her cousin; Parkinson's disease in her father.  ROS:   Please see the history of present illness.    All other systems reviewed and are negative.  EKGs/Labs/Other Studies Reviewed:    The following studies were reviewed today: Echo 12/05/2019: IMPRESSIONS    1. Left ventricular ejection fraction, by estimation, is 60 to 65%. The  left ventricle has normal function. The left ventricle has no regional  wall motion abnormalities. Left ventricular diastolic parameters were  normal.  2. Right ventricular systolic function is normal. The right ventricular  size is normal. There is mildly elevated pulmonary artery systolic  pressure.  3. Left atrial size was mildly dilated.  4. The mitral valve is normal in structure.  Trivial mitral valve  regurgitation. No evidence of mitral stenosis.  5. Tricuspid valve regurgitation is mild to moderate.  6. The aortic valve is normal in structure. Aortic valve regurgitation is  not visualized. No aortic stenosis is present.  7. The inferior vena cava is normal in size with greater than 50%  respiratory variability, suggesting right atrial pressure of 3 mmHg.   EKG:  EKG is not ordered today.    Recent Labs: 05/05/2019: ALT 10  Recent Lipid Panel    Component Value Date/Time   CHOL 142 07/21/2019 1003   TRIG 124 07/21/2019 1003   HDL 50 07/21/2019 1003   CHOLHDL 2.8 07/21/2019 1003   CHOLHDL 4 07/17/2016 1513   VLDL 23.0 07/17/2016 1513   LDLCALC 70 07/21/2019 1003   LDLDIRECT 165.0 06/30/2011 1153    Physical Exam:    VS:  BP 140/70   Pulse 70   Ht 5\' 6"  (1.676 m)   Wt 147 lb (66.7 kg)   SpO2 98%   BMI 23.73 kg/m     Wt Readings from Last 3 Encounters:  12/12/19 147 lb (66.7 kg)  06/09/19 149 lb 1.9 oz (67.6 kg)  05/27/19 149 lb 12.8 oz (67.9 kg)     GEN:  Well nourished, well developed in no acute distress HEENT: Normal NECK: No JVD; No carotid bruits LYMPHATICS: No lymphadenopathy CARDIAC: RRR, soft ejection murmur at the RUSB RESPIRATORY:  Clear to auscultation without rales, wheezing or rhonchi  ABDOMEN: Soft, non-tender, non-distended MUSCULOSKELETAL:  No edema; No deformity  SKIN: Warm and dry NEUROLOGIC:  Alert and oriented x 3 PSYCHIATRIC:  Normal affect   ASSESSMENT:    1. Tricuspid valve insufficiency, unspecified etiology   2. Palpitations   3. Essential hypertension   4. Mixed hyperlipidemia    PLAN:    In order of problems listed above:  1. The patient's echocardiogram is reviewed and shows mild to moderate tricuspid regurgitation.  These findings are stable.  I think she can be followed clinically and will consider a repeat echocardiogram in 2 to 3 years. 2. I suspect related to increased stress.  LV and RV  function are normal.  We are checking an outpatient monitor.  Might consider changing diltiazem to metoprolol succinate.  Await monitor results. 3. Blood pressure is controlled on current therapy.  Consider antihypertensive medicine change for palpitations as outlined above. 4. The patient is intolerant to statin drugs.  She has tried multiple agents.  We will continue with lifestyle modification.  Most recent labs reviewed.  She is a primary prevention patient with no history of CAD.   Medication Adjustments/Labs and Tests Ordered: Current medicines are reviewed at length with the patient today.  Concerns regarding medicines are outlined above.  No orders of the defined types were placed in this encounter.  No orders of the defined types were placed in this encounter.   There are no Patient Instructions on file for this visit.   Signed, Sherren Mocha, MD  12/12/2019 10:04 AM    [place]

## 2019-12-14 MED ORDER — DILTIAZEM HCL ER COATED BEADS 180 MG PO CP24: 180.0000 mg | ORAL_CAPSULE | Freq: Every day | ORAL | 3 refills | Status: DC

## 2019-12-16 ENCOUNTER — Telehealth: Payer: Medicare HMO | Admitting: Nurse Practitioner

## 2019-12-16 ENCOUNTER — Encounter: Payer: Self-pay | Admitting: Physician Assistant

## 2019-12-16 DIAGNOSIS — J329 Chronic sinusitis, unspecified: Secondary | ICD-10-CM

## 2019-12-16 MED ORDER — AMOXICILLIN-POT CLAVULANATE 875-125 MG PO TABS: 1.0000 | ORAL_TABLET | Freq: Two times a day (BID) | ORAL | 0 refills | Status: DC

## 2019-12-16 NOTE — Progress Notes (Signed)

## 2019-12-18 ENCOUNTER — Other Ambulatory Visit: Payer: Self-pay | Admitting: Physician Assistant

## 2019-12-18 DIAGNOSIS — F341 Dysthymic disorder: Secondary | ICD-10-CM

## 2019-12-19 NOTE — Telephone Encounter (Signed)
Xanax last rx 10/14/19 #30 1 RF LOV: 07/25/19 Breast pain CSC: 01/14/2018 UDS: 11/05/18

## 2019-12-22 ENCOUNTER — Telehealth (INDEPENDENT_AMBULATORY_CARE_PROVIDER_SITE_OTHER): Payer: Medicare HMO | Admitting: Physician Assistant

## 2019-12-22 ENCOUNTER — Encounter: Payer: Self-pay | Admitting: Physician Assistant

## 2019-12-22 ENCOUNTER — Other Ambulatory Visit: Payer: Self-pay

## 2019-12-22 VITALS — BP 139/76 | HR 73 | Temp 97.2°F

## 2019-12-22 DIAGNOSIS — B9689 Other specified bacterial agents as the cause of diseases classified elsewhere: Secondary | ICD-10-CM

## 2019-12-22 DIAGNOSIS — J019 Acute sinusitis, unspecified: Secondary | ICD-10-CM | POA: Diagnosis not present

## 2019-12-22 MED ORDER — CEFDINIR 300 MG PO CAPS: 300.0000 mg | ORAL_CAPSULE | Freq: Two times a day (BID) | ORAL | 0 refills | Status: DC

## 2019-12-22 NOTE — Patient Instructions (Signed)
Instructions sent to MyChart

## 2019-12-22 NOTE — Progress Notes (Signed)
Virtual Visit via Video   I connected with patient on 12/22/19 at 10:30 AM EDT by a video enabled telemedicine application and verified that I am speaking with the correct person using two identifiers.  Location patient: Home Location provider: [facility], Office Persons participating in the virtual visit: Patient, Provider, [place] (Patina Moore)  I discussed the limitations of evaluation and management by telemedicine and the availability of in person appointments. The patient expressed understanding and agreed to proceed.  Subjective:   HPI:   Patient presents via Caregility today complaining of 2+ weeks of sinus pressure, sinus pain, thick nasal drainage and fatigue.  Denies fever or chills.  Has noted some cough secondary to sinus drainage.  Was evaluated via E visit this past Saturday and diagnosed with bacterial sinusitis.  Was started on course of Augmentin which she started taking as directed.  Noted 2 days into antibiotic starting to have significant loose stool and increased frequency of stool.  As such as stop medication.  Has continued her OTC Flonase and saline nasal rinses.  Also taking OTC plain Mucinex.  Noted a nosebleed last night.  No recurrence.  ROS:   See pertinent positives and negatives per HPI.  Patient Active Problem List   Diagnosis Date Noted  . Closed fracture of right distal radius 01/12/2019  . Chronic migraine 04/07/2018  . New onset headache 04/07/2018  . Sore throat 01/19/2018  . Moderate obstructive sleep apnea 05/21/2017  . Nausea 04/12/2017  . Vitamin D deficiency 07/17/2016  . Abdominal aortic atherosclerosis ([place]) 07/16/2016  . Renal artery stenosis ([place]) 06/12/2016  . Chronic low back pain 08/24/2015  . Memory loss 08/24/2015  . New onset of headaches after age 30 04/26/2015  . Headache 04/16/2015  . Hyperlipidemia, mixed 12/17/2014  . Preventative health care 09/11/2014  . Hepatic cyst 05/22/2014  . Insomnia 03/10/2014  . Low  back pain 08/15/2013  . 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 Allergic state 12/31/2012  . Osteoporosis 09/07/2012  . 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 Mitral regurgitation 07/21/2011  . Gastroesophageal reflux disease with hiatal hernia 06/17/2011  . Endometrial hyperplasia   . Diverticulosis   . IBS (irritable bowel syndrome) 04/24/2011  . Barrett's esophagus 03/04/2011  . Hiatal hernia 03/04/2011  . BASAL CELL CARCINOMA, FACE 11/15/2009  . Migraine 11/15/2009  . WEIGHT GAIN 06/05/2009  . Hyperglycemia 06/05/2009  . Vitamin B12 deficiency 02/15/2009  . 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 Essential hypertension 01/04/2009  . ANXIETY DEPRESSION 12/14/2007    Social History   Tobacco Use  . Smoking status: Former Smoker    Quit date: 08/19/1995    Years since quitting: 24.3  . Smokeless tobacco: Never Used  Substance Use Topics  . Alcohol use: No    Alcohol/week: 0.0 standard drinks    Current Outpatient Medications:  .  albuterol (VENTOLIN HFA) 108 (90 Base) MCG/ACT inhaler, Inhale 2 puffs into the lungs every 6 (six) hours as needed for wheezing or shortness of breath., Disp: 8 g, Rfl: 0 .  ALPRAZolam (XANAX) 1 MG tablet, TAKE 1 TABLET BY MOUTH TWICE A DAY AS NEEDED FOR ANXIETY. No further refills without follow-up, Disp: 30 tablet, Rfl: 0 .  amoxicillin-clavulanate (AUGMENTIN) 875-125 MG tablet, Take 1 tablet by mouth 2 (two) times daily., Disp: 14 tablet, Rfl: 0 .  aspirin EC 81 MG tablet, Take 1 tablet (81 mg total) by mouth daily., Disp: , Rfl:  .  Biotin 1000 MCG tablet,  Take 1,000 mcg by mouth 3 (three) times daily., Disp: , Rfl:  .  Cholecalciferol (VITAMIN D-3) 5000 units TABS, Take 1 tablet by mouth daily., Disp: 30 tablet, Rfl: 0 .  Cyanocobalamin (VITAMIN B-12 PO), Take 1 tablet by mouth daily., Disp: , Rfl:  .  diltiazem (CARTIA XT) 180 MG 24 hr capsule, Take 1 capsule (180 mg total) by mouth daily., Disp: 90 capsule, Rfl: 3 .   fluticasone (FLONASE) 50 MCG/ACT nasal spray, SHAKE LIQUID AND USE 2 SPRAYS IN EACH NOSTRIL DAILY, Disp: 16 mL, Rfl: 1 .  hyoscyamine (LEVBID) 0.375 MG 12 hr tablet, Take 1 tablet by mouth as needed., Disp: , Rfl:  .  linaclotide (LINZESS) 72 MCG capsule, Take 72 mcg by mouth daily as needed. constipation, Disp: , Rfl:  .  Multiple Minerals (CALCIUM/MAGNESIUM/ZINC PO), Take 1 tablet by mouth daily., Disp: , Rfl:  .  ondansetron (ZOFRAN) 4 MG tablet, TAKE 1 TABLET BY MOUTH EVERY 8 HOURS AS NEEDED FOR NAUSEA / VOMITING, Disp: , Rfl:  .  promethazine (PHENERGAN) 25 MG tablet, TAKE 1 TABLET BY MOUTH THREE TIMES DAILY AS NEEDED FOR NAUSEA AND VOMITING, Disp: 30 tablet, Rfl: 0 .  sucralfate (CARAFATE) 1 g tablet, Take 1 g by mouth 4 (four) times daily -  with meals and at bedtime., Disp: , Rfl:  .  vitamin E 400 UNIT capsule, Take 400 Units by mouth daily., Disp: , Rfl:   Allergies  Allergen Reactions  . Iodine-131 Other (See Comments)    Other reaction(s): Dizziness (intolerance)  . Sulfamethoxazole Shortness Of Breath    chest tightness  . Amitriptyline Anxiety and Other (See Comments)    Depression, insomnia  . Effexor [Venlafaxine]     palpitations  . Iohexol Other (See Comments)    Passed out  . Lactose Intolerance (Gi) Diarrhea and Nausea And Vomiting  . Lexapro [Escitalopram Oxalate]     nausea  . Iodinated Diagnostic Agents     Pt states at Dr.Grapeys office 15 years ago blacked out for 2 hours during injection for IVP. Pt says she had dye recently with no problems     Objective:   BP 139/76   Pulse 73   Temp (!) 97.2 F (36.2 C)   Patient is well-developed, well-nourished in no acute distress.  Resting comfortably at home.  Head is normocephalic, atraumatic.  No labored breathing.  Speech is clear and coherent with logical content.  Patient is alert and oriented at baseline.  Positive TTP to maxillary sinuses bilaterally.  Assessment and Plan:   1. Acute bacterial  sinusitis Stop Augmentin.  Start daily probiotic.  Rx cefdinir as will be gentler on her intestines and she does not tolerate other antibiotics well.  Increase fluids.  Rest.  Saline nasal spray.  Probiotic.  Mucinex as directed.  Humidifier in bedroom.  We will have her stop Flonase for now as it likely contributed to nosebleed by drying out her nasal passages.  Call or return to clinic if symptoms are not improving.  - cefdinir (OMNICEF) 300 MG capsule; Take 1 capsule (300 mg total) by mouth 2 (two) times daily.  Dispense: 10 capsule; Refill: 0    Leeanne Rio, [place] 12/22/2019

## 2019-12-22 NOTE — Progress Notes (Signed)
I have discussed the procedure for the virtual visit with the patient who has given consent to proceed with assessment and treatment.   Shakyla Nolley N Lyal Husted, LPN    . 

## 2019-12-28 ENCOUNTER — Other Ambulatory Visit: Payer: Self-pay | Admitting: General Practice

## 2019-12-28 DIAGNOSIS — R739 Hyperglycemia, unspecified: Secondary | ICD-10-CM

## 2019-12-28 DIAGNOSIS — I10 Essential (primary) hypertension: Secondary | ICD-10-CM

## 2019-12-28 DIAGNOSIS — E782 Mixed hyperlipidemia: Secondary | ICD-10-CM

## 2019-12-28 DIAGNOSIS — K219 Gastro-esophageal reflux disease without esophagitis: Secondary | ICD-10-CM

## 2020-01-02 DIAGNOSIS — R000 Tachycardia, unspecified: Secondary | ICD-10-CM | POA: Diagnosis not present

## 2020-01-06 ENCOUNTER — Other Ambulatory Visit: Payer: Self-pay

## 2020-01-06 ENCOUNTER — Telehealth (INDEPENDENT_AMBULATORY_CARE_PROVIDER_SITE_OTHER): Payer: Medicare HMO | Admitting: Physician Assistant

## 2020-01-06 ENCOUNTER — Other Ambulatory Visit: Payer: Self-pay | Admitting: Physician Assistant

## 2020-01-06 ENCOUNTER — Encounter: Payer: Self-pay | Admitting: Physician Assistant

## 2020-01-06 VITALS — BP 124/66 | HR 78 | Temp 98.0°F

## 2020-01-06 DIAGNOSIS — F339 Major depressive disorder, recurrent, unspecified: Secondary | ICD-10-CM

## 2020-01-06 DIAGNOSIS — M5442 Lumbago with sciatica, left side: Secondary | ICD-10-CM | POA: Diagnosis not present

## 2020-01-06 MED ORDER — METHYLPREDNISOLONE 4 MG PO TBPK: ORAL_TABLET | ORAL | 0 refills | Status: DC

## 2020-01-06 MED ORDER — METHOCARBAMOL 500 MG PO TABS: 500.0000 mg | ORAL_TABLET | Freq: Every evening | ORAL | 0 refills | Status: DC | PRN

## 2020-01-06 NOTE — Progress Notes (Signed)
Virtual Visit via Video   I connected with patient on 01/06/20 at  3:00 PM EDT by a video enabled telemedicine application and verified that I am speaking with the correct person using two identifiers.  Location patient: Home Location provider: [facility], Office Persons participating in the virtual visit: Patient, Provider, [place] (Patina Moore)  I discussed the limitations of evaluation and management by telemedicine and the availability of in person appointments. The patient expressed understanding and agreed to proceed.  Subjective:   HPI:   Patient presents via Caregility today complaining of a few days of left-sided low back pain with occasional radiation down into her left lower extremity.  Notes symptoms began after she did a lot of heavy lifting and yard work.  Denies any trauma or injury.  Denies symptoms of right side of lumbar spine.  Notes pain worse with movement.  Denies any numbness, tingling or weakness of lower extremity.  Denies saddle anesthesia or change to bowel/bladder habits.  Has taken Tylenol to help ease pain.  ROS:   See pertinent positives and negatives per HPI.  Patient Active Problem List   Diagnosis Date Noted  . Closed fracture of right distal radius 01/12/2019  . Chronic migraine 04/07/2018  . New onset headache 04/07/2018  . Sore throat 01/19/2018  . Moderate obstructive sleep apnea 05/21/2017  . Nausea 04/12/2017  . Vitamin D deficiency 07/17/2016  . Abdominal aortic atherosclerosis ([place]) 07/16/2016  . Renal artery stenosis ([place]) 06/12/2016  . Chronic low back pain 08/24/2015  . Memory loss 08/24/2015  . New onset of headaches after age 52 04/26/2015  . Headache 04/16/2015  . Hyperlipidemia, mixed 12/17/2014  . Preventative health care 09/11/2014  . Hepatic cyst 05/22/2014  . Insomnia 03/10/2014  . Low back pain 08/15/2013  . 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 Allergic state 12/31/2012  . Osteoporosis 09/07/2012  . Sacroiliac joint  disease 04/27/2012  . 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 Mitral regurgitation 07/21/2011  . Gastroesophageal reflux disease with hiatal hernia 06/17/2011  . Endometrial hyperplasia   . Diverticulosis   . IBS (irritable bowel syndrome) 04/24/2011  . Barrett's esophagus 03/04/2011  . Hiatal hernia 03/04/2011  . BASAL CELL CARCINOMA, FACE 11/15/2009  . Migraine 11/15/2009  . WEIGHT GAIN 06/05/2009  . Hyperglycemia 06/05/2009  . Vitamin B12 deficiency 02/15/2009  . 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 Essential hypertension 01/04/2009  . ANXIETY DEPRESSION 12/14/2007    Social History   Tobacco Use  . Smoking status: Former Smoker    Quit date: 08/19/1995    Years since quitting: 24.4  . Smokeless tobacco: Never Used  Substance Use Topics  . Alcohol use: No    Alcohol/week: 0.0 standard drinks    Current Outpatient Medications:  .  albuterol (VENTOLIN HFA) 108 (90 Base) MCG/ACT inhaler, Inhale 2 puffs into the lungs every 6 (six) hours as needed for wheezing or shortness of breath., Disp: 8 g, Rfl: 0 .  ALPRAZolam (XANAX) 1 MG tablet, TAKE 1 TABLET BY MOUTH TWICE A DAY AS NEEDED FOR ANXIETY. No further refills without follow-up, Disp: 30 tablet, Rfl: 0 .  aspirin EC 81 MG tablet, Take 1 tablet (81 mg total) by mouth daily., Disp: , Rfl:  .  Biotin 1000 MCG tablet, Take 1,000 mcg by mouth 3 (three) times daily., Disp: , Rfl:  .  Cholecalciferol (VITAMIN D-3) 5000 units TABS, Take 1 tablet by mouth daily., Disp: 30 tablet, Rfl: 0 .  Cyanocobalamin (VITAMIN B-12 PO), Take 1 tablet  by mouth daily., Disp: , Rfl:  .  diltiazem (CARTIA XT) 180 MG 24 hr capsule, Take 1 capsule (180 mg total) by mouth daily., Disp: 90 capsule, Rfl: 3 .  fluticasone (FLONASE) 50 MCG/ACT nasal spray, SHAKE LIQUID AND USE 2 SPRAYS IN EACH NOSTRIL DAILY, Disp: 16 mL, Rfl: 1 .  hyoscyamine (LEVBID) 0.375 MG 12 hr tablet, Take 1 tablet by mouth as needed., Disp: , Rfl:  .  linaclotide (LINZESS) 72 MCG capsule,  Take 72 mcg by mouth daily as needed. constipation, Disp: , Rfl:  .  Multiple Minerals (CALCIUM/MAGNESIUM/ZINC PO), Take 1 tablet by mouth daily., Disp: , Rfl:  .  ondansetron (ZOFRAN) 4 MG tablet, TAKE 1 TABLET BY MOUTH EVERY 8 HOURS AS NEEDED FOR NAUSEA / VOMITING, Disp: , Rfl:  .  sucralfate (CARAFATE) 1 g tablet, Take 1 g by mouth 4 (four) times daily -  with meals and at bedtime., Disp: , Rfl:  .  vitamin E 400 UNIT capsule, Take 400 Units by mouth daily., Disp: , Rfl:   Allergies  Allergen Reactions  . Iodine-131 Other (See Comments)    Other reaction(s): Dizziness (intolerance)  . Sulfamethoxazole Shortness Of Breath    chest tightness  . Amitriptyline Anxiety and Other (See Comments)    Depression, insomnia  . Effexor [Venlafaxine]     palpitations  . Iohexol Other (See Comments)    Passed out  . Lactose Intolerance (Gi) Diarrhea and Nausea And Vomiting  . Lexapro [Escitalopram Oxalate]     nausea  . Iodinated Diagnostic Agents     Pt states at Dr.Grapeys office 15 years ago blacked out for 2 hours during injection for IVP. Pt says she had dye recently with no problems     Objective:   There were no vitals taken for this visit.  Patient is well-developed, well-nourished in no acute distress.  Resting comfortably at home.  Head is normocephalic, atraumatic.  No labored breathing.  Speech is clear and coherent with logical content.  Patient is alert and oriented at baseline.   Assessment and Plan:   1. Acute left-sided low back pain with left-sided sciatica Atraumatic.  No alarm signs/symptoms present.  Encouraged her to avoid heavy lifting and overexertion.  Stretching exercises reviewed with patient.  Will begin treatment with Medrol Dosepak.  Rx Robaxin to use in the evening.  Can continue Tylenol OTC.  Supportive measures reviewed.  Recommend in office follow-up next week if symptoms or not completely resolved.  Patient voiced understanding and agreement with the  plan.  - methylPREDNISolone (MEDROL DOSEPAK) 4 MG TBPK tablet; Take following package directions  Dispense: 21 tablet; Refill: 0 - methocarbamol (ROBAXIN) 500 MG tablet; Take 1 tablet (500 mg total) by mouth at bedtime as needed for muscle spasms.  Dispense: 15 tablet; Refill: 0    Leeanne Rio, [place] 01/06/2020

## 2020-01-06 NOTE — Progress Notes (Signed)
I have discussed the procedure for the virtual visit with the patient who has given consent to proceed with assessment and treatment.   Martino Tompson S Cade Dashner, CMA     

## 2020-01-09 ENCOUNTER — Encounter: Payer: Self-pay | Admitting: Physician Assistant

## 2020-01-10 MED ORDER — CYCLOBENZAPRINE HCL 5 MG PO TABS: 5.0000 mg | ORAL_TABLET | Freq: Every day | ORAL | 0 refills | Status: DC

## 2020-01-13 ENCOUNTER — Encounter: Payer: Self-pay | Admitting: Physician Assistant

## 2020-01-13 ENCOUNTER — Ambulatory Visit (INDEPENDENT_AMBULATORY_CARE_PROVIDER_SITE_OTHER): Payer: Medicare HMO

## 2020-01-13 ENCOUNTER — Other Ambulatory Visit: Payer: Medicare HMO

## 2020-01-13 ENCOUNTER — Other Ambulatory Visit: Payer: Self-pay

## 2020-01-13 ENCOUNTER — Ambulatory Visit (INDEPENDENT_AMBULATORY_CARE_PROVIDER_SITE_OTHER): Payer: Medicare HMO | Admitting: Physician Assistant

## 2020-01-13 VITALS — BP 140/70 | HR 92 | Temp 98.7°F | Resp 16 | Ht 66.0 in | Wt 145.0 lb

## 2020-01-13 DIAGNOSIS — M545 Low back pain, unspecified: Secondary | ICD-10-CM

## 2020-01-13 DIAGNOSIS — R1032 Left lower quadrant pain: Secondary | ICD-10-CM

## 2020-01-13 LAB — COMPREHENSIVE METABOLIC PANEL
ALT: 14 U/L (ref 0–35)
AST: 17 U/L (ref 0–37)
Albumin: 4.4 g/dL (ref 3.5–5.2)
Alkaline Phosphatase: 89 U/L (ref 39–117)
BUN: 6 mg/dL (ref 6–23)
CO2: 28 mEq/L (ref 19–32)
Calcium: 9.4 mg/dL (ref 8.4–10.5)
Chloride: 102 mEq/L (ref 96–112)
Creatinine, Ser: 0.62 mg/dL (ref 0.40–1.20)
GFR: 95.3 mL/min (ref >60.00)
Glucose, Bld: 108 mg/dL — ABNORMAL HIGH (ref 70–99)
Potassium: 3.9 mEq/L (ref 3.5–5.1)
Sodium: 139 mEq/L (ref 135–145)
Total Bilirubin: 0.3 mg/dL (ref 0.2–1.2)
Total Protein: 7.2 g/dL (ref 6.0–8.3)

## 2020-01-13 LAB — CBC WITH DIFFERENTIAL/PLATELET
Basophils Absolute: 0.1 10*3/uL (ref 0.0–0.1)
Basophils Relative: 0.9 % (ref 0.0–3.0)
Eosinophils Absolute: 0.1 10*3/uL (ref 0.0–0.7)
Eosinophils Relative: 1.1 % (ref 0.0–5.0)
HCT: 42 % (ref 36.0–46.0)
Hemoglobin: 14 g/dL (ref 12.0–15.0)
Lymphocytes Relative: 33.8 % (ref 12.0–46.0)
Lymphs Abs: 2.8 10*3/uL (ref 0.7–4.0)
MCHC: 33.4 g/dL (ref 30.0–36.0)
MCV: 94.5 fl (ref 78.0–100.0)
Monocytes Absolute: 0.6 10*3/uL (ref 0.1–1.0)
Monocytes Relative: 7.8 % (ref 3.0–12.0)
Neutro Abs: 4.6 10*3/uL (ref 1.4–7.7)
Neutrophils Relative %: 56.4 % (ref 43.0–77.0)
Platelets: 329 10*3/uL (ref 150.0–400.0)
RBC: 4.44 Mil/uL (ref 3.87–5.11)
RDW: 12.5 % (ref 11.5–15.5)
WBC: 8.2 10*3/uL (ref 4.0–10.5)

## 2020-01-13 LAB — POCT URINALYSIS DIPSTICK
Bilirubin, UA: NEGATIVE
Blood, UA: NEGATIVE
Glucose, UA: NEGATIVE
Ketones, UA: NEGATIVE
Leukocytes, UA: NEGATIVE
Nitrite, UA: NEGATIVE
Protein, UA: NEGATIVE
Spec Grav, UA: 1.01 (ref 1.010–1.025)
Urobilinogen, UA: 0.2 E.U./dL
pH, UA: 6 (ref 5.0–8.0)

## 2020-01-13 MED ORDER — CIPROFLOXACIN HCL 500 MG PO TABS: 500.0000 mg | ORAL_TABLET | Freq: Two times a day (BID) | ORAL | 0 refills | Status: DC

## 2020-01-13 MED ORDER — METRONIDAZOLE 500 MG PO TABS: 500.0000 mg | ORAL_TABLET | Freq: Three times a day (TID) | ORAL | 0 refills | Status: DC

## 2020-01-13 NOTE — Progress Notes (Signed)
Patient presents to clinic today c/o continued lower back pain, mainly l-sided but can be bilateral. Was seen for this via video visit last week and started on steroid and muscle relaxant. Was unable to take as directed due to side effect. Robaxin was switched to Flexeril as she noted she had tolerated in the past. Has been taking as directed and noting some improvement. Pain in back is with movement, especially bending. Denies any further radiation into lower extremity.  Has also now noted bowel changed with LLQ pain, sometimes cramping and sometimes sharp. Denies fever, chills, nausea or vomiting. Denies change in diet.   Past Medical History:  Diagnosis Date  . 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 Allergic state 12/31/2012  . Anxiety   . Asthma   . Barrett esophagus   . Blind loop syndrome   . C. difficile diarrhea   . Cancer ([place])   . Chest pain, atypical 07/14/2011  . Cold sore 07/17/2016  . Colon polyp   . Contact dermatitis 03/23/2012  . Cystocele, midline   . Depression   . Diarrhea   . Diverticulosis   . Endometrial hyperplasia 02/27/2006   BENIGN ENDO BX ON 02/2007  . GERD (gastroesophageal reflux disease)   . Headache 04/16/2015  . Headache   . Headache(784.0) 04/15/2012  . Hematuria 04/27/2012  . Hepatic cyst   . Hiatal hernia   . Hiatal hernia   . Hyperlipidemia, mixed 12/17/2014  . Hypertension   . IBS (irritable bowel syndrome)   . Insomnia   . Leaky heart valve   . Low back pain 08/15/2013  . Osteoporosis 03/2017   T score -2.5. Without significant loss from prior studies  . 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 Paronychia of great toe, left 06/19/2013  . Preventative health care 09/11/2014  . Rectal bleeding 10/10/2011  . Rectocele   . 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 Sinusitis acute 02/03/2012  . Thrush 07/21/2011  . 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 Unspecified hypothyroidism   . Unspecified menopausal and postmenopausal disorder     . Uterine prolapse without mention of vaginal wall prolapse   . Vitamin B12 deficiency   . Vitamin D deficiency 07/17/2016    Current Outpatient Medications on File Prior to Visit  Medication Sig Dispense Refill  . albuterol (VENTOLIN HFA) 108 (90 Base) MCG/ACT inhaler Inhale 2 puffs into the lungs every 6 (six) hours as needed for wheezing or shortness of breath. 8 g 0  . ALPRAZolam (XANAX) 1 MG tablet TAKE 1 TABLET BY MOUTH TWICE A DAY AS NEEDED FOR ANXIETY. No further refills without follow-up 30 tablet 0  . aspirin EC 81 MG tablet Take 1 tablet (81 mg total) by mouth daily.    . Biotin 1000 MCG tablet Take 1,000 mcg by mouth 3 (three) times daily.    . Cholecalciferol (VITAMIN D-3) 5000 units TABS Take 1 tablet by mouth daily. 30 tablet 0  . Cyanocobalamin (VITAMIN B-12 PO) Take 1 tablet by mouth daily.    . cyclobenzaprine (FLEXERIL) 5 MG tablet Take 1 tablet (5 mg total) by mouth at bedtime. 10 tablet 0  . diltiazem (CARTIA XT) 180 MG 24 hr capsule Take 1 capsule (180 mg total) by mouth daily. 90 capsule 3  . FLUoxetine (PROZAC) 10 MG capsule TAKE 1 CAPSULE BY MOUTH EVERY DAY 30 capsule 3  . fluticasone (FLONASE) 50 MCG/ACT nasal spray SHAKE LIQUID AND USE 2 SPRAYS IN EACH NOSTRIL DAILY 16  mL 1  . hyoscyamine (LEVBID) 0.375 MG 12 hr tablet Take 1 tablet by mouth as needed.    . linaclotide (LINZESS) 72 MCG capsule Take 72 mcg by mouth daily as needed. constipation    . Multiple Minerals (CALCIUM/MAGNESIUM/ZINC PO) Take 1 tablet by mouth daily.    . ondansetron (ZOFRAN) 4 MG tablet TAKE 1 TABLET BY MOUTH EVERY 8 HOURS AS NEEDED FOR NAUSEA / VOMITING    . sucralfate (CARAFATE) 1 g tablet Take 1 g by mouth 4 (four) times daily -  with meals and at bedtime.    . vitamin E 400 UNIT capsule Take 400 Units by mouth daily.     No current facility-administered medications on file prior to visit.    Allergies  Allergen Reactions  . Iodine-131 Other (See Comments)    Other reaction(s):  Dizziness (intolerance)  . Sulfamethoxazole Shortness Of Breath    chest tightness  . Amitriptyline Anxiety and Other (See Comments)    Depression, insomnia  . Effexor [Venlafaxine]     palpitations  . Iohexol Other (See Comments)    Passed out  . Lactose Intolerance (Gi) Diarrhea and Nausea And Vomiting  . Lexapro [Escitalopram Oxalate]     nausea  . Iodinated Diagnostic Agents     Pt states at Dr.Grapeys office 15 years ago blacked out for 2 hours during injection for IVP. Pt says she had dye recently with no problems     Family History  Problem Relation Age of Onset  . Colon cancer Mother   . Diabetes Mother   . Hypertension Mother   . Colon polyps Father   . Parkinson's disease Father   . Colon cancer Maternal Grandmother   . Breast cancer Maternal Grandmother 27  . Diabetes Paternal Grandmother   . Breast cancer Paternal Grandmother 35  . Allergies Sister   . Arthritis Brother        b/l hip replace  . Alcohol abuse Daughter   . Arthritis Son   . Hypertension Son   . Allergies Son   . Osteoporosis Son   . Osteoporosis Son   . Arthritis Son        back disease, DDD  . Allergies Son   . Arthritis Son   . Irritable bowel syndrome Son        RSD  . Hypertension Paternal Grandfather   . Heart disease Paternal Grandfather   . Aneurysm Paternal Grandfather   . Colon cancer Maternal Aunt   . Colon cancer Cousin        X3  . Ovarian cancer Cousin     Social History   Socioeconomic History  . Marital status: Married    Spouse name: Not on file  . Number of children: 4  . Years of education: 40  . Highest education level: High school graduate  Occupational History  . Occupation: Retired  Tobacco Use  . Smoking status: Former Smoker    Quit date: 08/19/1995    Years since quitting: 24.4  . Smokeless tobacco: Never Used  Substance and Sexual Activity  . Alcohol use: No    Alcohol/week: 0.0 standard drinks  . Drug use: No    Types: Hydrocodone  . Sexual  activity: Not Currently    Birth control/protection: Post-menopausal    Comment: 1st intercourse 38 yo-5 partners  Other Topics Concern  . Not on file  Social History Narrative   Lives at home with her husband.   Right-handed.   2 cups  caffeine per day.   Social Determinants of Health   Financial Resource Strain:   . Difficulty of Paying Living Expenses:   Food Insecurity:   . Worried About Charity fundraiser in the Last Year:   . Arboriculturist in the Last Year:   Transportation Needs:   . Film/video editor (Medical):   Marland Kitchen Lack of Transportation (Non-Medical):   Physical Activity:   . Days of Exercise per Week:   . Minutes of Exercise per Session:   Stress:   . Feeling of Stress :   Social Connections:   . Frequency of Communication with Friends and Family:   . Frequency of Social Gatherings with Friends and Family:   . Attends Religious Services:   . Active Member of Clubs or Organizations:   . Attends Archivist Meetings:   Marland Kitchen Marital Status:    Review of Systems - See HPI.  All other ROS are negative.  BP 140/70   Pulse 92   Temp 98.7 F (37.1 C) (Temporal)   Resp 16   Ht '5\' 6"'$  (1.676 m)   Wt 145 lb (65.8 kg)   SpO2 99%   BMI 23.40 kg/m   Physical Exam Vitals reviewed.  Constitutional:      Appearance: Normal appearance.  HENT:     Head: Normocephalic and atraumatic.  Cardiovascular:     Rate and Rhythm: Normal rate and regular rhythm.     Pulses: Normal pulses.     Heart sounds: Normal heart sounds.  Pulmonary:     Effort: Pulmonary effort is normal.     Breath sounds: Normal breath sounds.  Abdominal:     General: Bowel sounds are normal.     Tenderness: There is abdominal tenderness in the left lower quadrant. There is no right CVA tenderness, left CVA tenderness, guarding or rebound.     Hernia: No hernia is present.  Musculoskeletal:     Cervical back: Neck supple.     Lumbar back: Spasms and tenderness (L sacroiliac region)  present. No bony tenderness. Normal range of motion.  Neurological:     General: No focal deficit present.     Mental Status: She is alert and oriented to person, place, and time.    Assessment/Plan: 1. LLQ pain Giving bowel changes, cramping and LLQ tenderness with + history of diverticulosis concern for mild diverticulitis. Ua negative. Will check CBC and CMP today. Start Cipro and Flagyl x 7 days. Close follow-up scheduled. Patient to contact her GI as she is overdue for follow-up.  - CBC w/Diff - Comp Met (CMET) - POCT Urinalysis Dipstick - ciprofloxacin (CIPRO) 500 MG tablet; Take 1 tablet (500 mg total) by mouth 2 (two) times daily.  Dispense: 14 tablet; Refill: 0 - metroNIDAZOLE (FLAGYL) 500 MG tablet; Take 1 tablet (500 mg total) by mouth 3 (three) times daily.  Dispense: 21 tablet; Refill: 0  2. Back pain, lumbosacral Xray today to look at lumbar spine and sacroiliac joints. Increase Flexeril to 10 mg nightly. Supportive measures and OTC medications reviewed.  - DG Lumbar Spine Complete; Future - POCT Urinalysis Dipstick  This visit occurred during the SARS-CoV-2 public health emergency.  Safety protocols were in place, including screening questions prior to the visit, additional usage of staff PPE, and extensive cleaning of exam room while observing appropriate contact time as indicated for disinfecting solutions.     Leeanne Rio, PA-C

## 2020-01-13 NOTE — Patient Instructions (Signed)
Please go to the lab today for blood work.  I will call you with your results. We will alter treatment regimen(s) if indicated by your results.   Please speak to the front desk staff to schedule your x-ray. Please go to the [facility] office for x-ray. We will call you with your results and alter treatment accordingly.   [address]  [place], [place] [id number]  For the back issue, again will alter based on x-ray findings. Avoid heavy lifting and overexertion. Tylenol for pain. Ok to increase the Cyclobenzaprine to 10 mg (2 tablets) nightly. Apply heating pad to the lower back.   I am concerned that some of the pain is referred from your abdomen as well. Giving your GI symptoms and prior colonoscopy findings I am concerned about a mild diverticulitis. Your urine was clear today. Start the antibiotics, taking as directed. I want you to call your GI provider to schedule a follow-up.   Give me a call Monday to let me know how things are going. Marland Kitchen

## 2020-01-16 ENCOUNTER — Other Ambulatory Visit: Payer: Self-pay | Admitting: Physician Assistant

## 2020-01-16 DIAGNOSIS — F341 Dysthymic disorder: Secondary | ICD-10-CM

## 2020-01-17 ENCOUNTER — Other Ambulatory Visit: Payer: Self-pay | Admitting: Emergency Medicine

## 2020-01-17 ENCOUNTER — Encounter: Payer: Self-pay | Admitting: Physician Assistant

## 2020-01-17 ENCOUNTER — Other Ambulatory Visit: Payer: Self-pay | Admitting: Physician Assistant

## 2020-01-17 DIAGNOSIS — M545 Low back pain, unspecified: Secondary | ICD-10-CM

## 2020-01-17 MED ORDER — CYCLOBENZAPRINE HCL 10 MG PO TABS: 10.0000 mg | ORAL_TABLET | Freq: Every day | ORAL | 0 refills | Status: DC

## 2020-01-17 NOTE — Telephone Encounter (Signed)
Pt is aware that the script is going to be sent in and she happy to do the imaging and referral. Pt can be reached at the home #

## 2020-01-17 NOTE — Progress Notes (Unsigned)
Chronic Care Management Pharmacy  Name: Megan [name]  MRN: [id number] DOB: March 20, 1951  Chief Complaint/ HPI  Amie Portland,  69 y.o. , female presents for their Initial CCM visit with the clinical pharmacist via telephone due to COVID-19 Pandemic.  PCP : Brunetta Jeans, PA-C  Their chronic conditions include:  Encounter Diagnoses  Name Primary?  . Irritable bowel syndrome, unspecified type Yes  . Gastroesophageal reflux disease with hiatal hernia   . Hyperlipidemia, mixed   . Depression, recurrent ([place])   . ANXIETY DEPRESSION     Office Visits:  01/13/2020 (PCP): Back pain/LLQ pain. Previously switched from robaxin to flexeril due to back pain. Start Cipro and Flagyl x 7 days. Close follow-up scheduled. Patient to contact her GI as she is overdue for follow-up. X-ray reveals multilevel degenerative disc disease (arthritic changes), worse at L1-2 and worsened from prior imaging. Would like to have her see Ortho and place referral if willing    Consult Visit: 12/12/2019 (Dr. Burt Knack, Cardiology): Checking an outpatient monitor, might consider changing diltiazem to metoprolol succinate pending results.   Patient Active Problem List   Diagnosis Date Noted  . Closed fracture of right distal radius 01/12/2019  . Chronic migraine 04/07/2018  . New onset headache 04/07/2018  . Sore throat 01/19/2018  . Moderate obstructive sleep apnea 05/21/2017  . Nausea 04/12/2017  . Vitamin D deficiency 07/17/2016  . Abdominal aortic atherosclerosis ([place]) 07/16/2016  . Renal artery stenosis ([place]) 06/12/2016  . Chronic low back pain 08/24/2015  . Memory loss 08/24/2015  . New onset of headaches after age 69 04/26/2015  . Headache 04/16/2015  . Hyperlipidemia, mixed 12/17/2014  . Preventative health care 09/11/2014  . Hepatic cyst 05/22/2014  . Insomnia 03/10/2014  . Low back pain 08/15/2013  . 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 Allergic state 12/31/2012  . Osteoporosis 09/07/2012    . 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 Mitral regurgitation 07/21/2011  . Gastroesophageal reflux disease with hiatal hernia 06/17/2011  . Endometrial hyperplasia   . Diverticulosis   . IBS (irritable bowel syndrome) 04/24/2011  . Barrett's esophagus 03/04/2011  . Hiatal hernia 03/04/2011  . BASAL CELL CARCINOMA, FACE 11/15/2009  . Migraine 11/15/2009  . WEIGHT GAIN 06/05/2009  . Hyperglycemia 06/05/2009  . Vitamin B12 deficiency 02/15/2009  . 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 Essential hypertension 01/04/2009  . ANXIETY DEPRESSION 12/14/2007   Social History   Socioeconomic History  . Marital status: Married    Spouse name: Not on file  . Number of children: 4  . Years of education: 55  . Highest education level: High school graduate  Occupational History  . Occupation: Retired  Tobacco Use  . Smoking status: Former Smoker    Quit date: 08/19/1995    Years since quitting: 24.4  . Smokeless tobacco: Never Used  Substance and Sexual Activity  . Alcohol use: No    Alcohol/week: 0.0 standard drinks  . Drug use: No    Types: Hydrocodone  . Sexual activity: Not Currently    Birth control/protection: Post-menopausal    Comment: 1st intercourse 34 yo-5 partners  Other Topics Concern  . Not on file  Social History Narrative   Lives at home with her husband.   Right-handed.   2 cups caffeine per day.   Social Determinants of Health   Financial Resource Strain:   . Difficulty of Paying Living Expenses:   Food Insecurity:   . Worried About Estate manager/land agent  of Food in the Last Year:   . [place] in the Last Year:   Transportation Needs:   . Lack of Transportation (Medical):   Marland Kitchen Lack of Transportation (Non-Medical):   Physical Activity:   . Days of Exercise per Week:   . Minutes of Exercise per Session:   Stress:   . Feeling of Stress :   Social Connections:   . Frequency of Communication with Friends and Family:   .  Frequency of Social Gatherings with Friends and Family:   . Attends Religious Services:   . Active Member of Clubs or Organizations:   . Attends Archivist Meetings:   Marland Kitchen Marital Status:    Family History  Problem Relation Age of Onset  . Colon cancer Mother   . Diabetes Mother   . Hypertension Mother   . Colon polyps Father   . Parkinson's disease Father   . Colon cancer Maternal Grandmother   . Breast cancer Maternal Grandmother 11  . Diabetes Paternal Grandmother   . Breast cancer Paternal Grandmother 3  . Allergies Sister   . Arthritis Brother        b/l hip replace  . Alcohol abuse Daughter   . Arthritis Son   . Hypertension Son   . Allergies Son   . Osteoporosis Son   . Osteoporosis Son   . Arthritis Son        back disease, DDD  . Allergies Son   . Arthritis Son   . Irritable bowel syndrome Son        RSD  . Hypertension Paternal Grandfather   . Heart disease Paternal Grandfather   . Aneurysm Paternal Grandfather   . Colon cancer Maternal Aunt   . Colon cancer Cousin        X3  . Ovarian cancer Cousin    Allergies  Allergen Reactions  . Iodine-131 Other (See Comments)    Other reaction(s): Dizziness (intolerance)  . Sulfamethoxazole Shortness Of Breath    chest tightness  . Amitriptyline Anxiety and Other (See Comments)    Depression, insomnia  . Effexor [Venlafaxine]     palpitations  . Iohexol Other (See Comments)    Passed out  . Lactose Intolerance (Gi) Diarrhea and Nausea And Vomiting  . Lexapro [Escitalopram Oxalate]     nausea  . Iodinated Diagnostic Agents     Pt states at Dr.Grapeys office 15 years ago blacked out for 2 hours during injection for IVP. Pt says she had dye recently with no problems    Outpatient Encounter Medications as of 01/18/2020  Medication Sig  . albuterol (VENTOLIN HFA) 108 (90 Base) MCG/ACT inhaler Inhale 2 puffs into the lungs every 6 (six) hours as needed for wheezing or shortness of breath.  . ALPRAZolam  (XANAX) 1 MG tablet TAKE 1 TABLET BY MOUTH TWICE A DAY AS NEEDED FOR ANXIETY. No further refills without follow-up  . aspirin EC 81 MG tablet Take 1 tablet (81 mg total) by mouth daily.  . Biotin 1000 MCG tablet Take 1,000 mcg by mouth 3 (three) times daily.  . Cholecalciferol (VITAMIN D-3) 5000 units TABS Take 1 tablet by mouth daily.  . ciprofloxacin (CIPRO) 500 MG tablet Take 1 tablet (500 mg total) by mouth 2 (two) times daily.  . Cyanocobalamin (VITAMIN B-12 PO) Take 1 tablet by mouth daily.  . cyclobenzaprine (FLEXERIL) 10 MG tablet Take 1 tablet (10 mg total) by mouth at bedtime.  Marland Kitchen diltiazem (CARTIA XT) 180  MG 24 hr capsule Take 1 capsule (180 mg total) by mouth daily.  Marland Kitchen FLUoxetine (PROZAC) 10 MG capsule TAKE 1 CAPSULE BY MOUTH EVERY DAY  . fluticasone (FLONASE) 50 MCG/ACT nasal spray SHAKE LIQUID AND USE 2 SPRAYS IN EACH NOSTRIL DAILY  . hyoscyamine (LEVBID) 0.375 MG 12 hr tablet Take 1 tablet by mouth as needed.  . linaclotide (LINZESS) 72 MCG capsule Take 72 mcg by mouth daily as needed. constipation  . metroNIDAZOLE (FLAGYL) 500 MG tablet Take 1 tablet (500 mg total) by mouth 3 (three) times daily.  . Multiple Minerals (CALCIUM/MAGNESIUM/ZINC PO) Take 1 tablet by mouth daily.  . ondansetron (ZOFRAN) 4 MG tablet TAKE 1 TABLET BY MOUTH EVERY 8 HOURS AS NEEDED FOR NAUSEA / VOMITING  . sucralfate (CARAFATE) 1 g tablet Take 1 g by mouth 4 (four) times daily -  with meals and at bedtime.  . vitamin E 400 UNIT capsule Take 400 Units by mouth daily.  . [DISCONTINUED] cyclobenzaprine (FLEXERIL) 5 MG tablet Take 1 tablet (5 mg total) by mouth at bedtime.   No facility-administered encounter medications on file as of 01/18/2020.   Patient Care Team    Relationship Specialty Notifications Start End  Brunetta Jeans, [place] PCP - General Family Medicine  11/04/16   Sherren Mocha, MD PCP - Cardiology Cardiology Admissions 09/04/17   Duffey, Minta Balsam, NP Nurse Practitioner Nurse  Practitioner  [phone]   Jodi Marble, MD Consulting Physician Otolaryngology  07/16/16   Anastasio Auerbach, MD (Inactive) Consulting Physician Gynecology  11/04/16   Conrad McGuffey, MD Consulting Physician Vascular Surgery  11/04/16   Warden Fillers, MD Consulting Physician Ophthalmology  04/06/18   Marcial Pacas, MD Consulting Physician Neurology  06/30/18   Aviva Signs, MD Referring Physician Gastroenterology  06/30/18   Madelin Rear, [facility] Pharmacist Pharmacist  12/06/19    Comment: PHONE NUMBER [phone]   Current Diagnosis/Assessment: Goals Addressed   None    Hypertension   BP today is:  {CHL HP UPSTREAM Pharmacist BP ranges:[phone]}  BP Readings from Last 3 Encounters:  01/13/20 140/70  01/06/20 124/66  12/22/19 139/76   Patient has failed these meds in the past: ***  Patient checks BP at home {CHL HP BP Monitoring Frequency:[phone]}  Patient home BP readings are ranging: ***  We discussed {CHL HP Upstream Pharmacy discussion:[phone]}   Patient is currently {CHL Controlled/Uncontrolled:[phone]} on the following medications:   Diltiazem 180 mg xl daily   Plan  Continue {CHL HP Upstream Pharmacy [phone]}    Hyperlipidemia / ASCVD prevention   Lipid Panel     Component Value Date/Time   CHOL 142 07/21/2019 1003   TRIG 124 07/21/2019 1003   HDL 50 07/21/2019 1003   LDLCALC 70 07/21/2019 1003   LDLDIRECT 165.0 06/30/2011 1153     The 10-year ASCVD risk score Mikey Bussing DC Jr., et al., 2013) is: 12.3%   Values used to calculate the score:     Age: 59 years     Sex: Female     Is Non-Hispanic African American: No     Diabetic: No     Tobacco smoker: No     Systolic Blood Pressure: [id number] mmHg     Is BP treated: Yes     HDL Cholesterol: 50 mg/dL     Total Cholesterol: 142 mg/dL   Statin intolerant after multiple trials. Rosuvastatin hx noted. 07/2020 - LDL had improved to 70 from 135. Managed on diet alone.   Primary CVD prevention.  Taking: . Aspirin 81 mg   Plan  Continue {CHL HP Upstream Pharmacy Plans:3053058098}  Depression, recurrent    {denies_reports_expresses:23828::"Reports"} {benefitdcsideffect:23827::"***"}. PHQ 2/9: 0 01/06/2020. Patient is currently {CHL Controlled/Uncontrolled:[phone]} on   Fluoxetine 10 mg daily  Plan  {rxplan:23810::"Continue current medication."}   Anxiety   {denies_reports_expresses:23828::"Reports"} {benefitdcsideffect:23827::"***"}. Previously has tried ***. Patient is currently {CHL Controlled/Uncontrolled:[phone]} on   Xanax 1 mg twice daily as needed for anxiety  Plan  {rxplan:23810::"Continue current medication."}   GERD   Patient {Actions; denies-reports:120008::"denies"} dysphagia, heartburn or nausea.  Expresses understanding to avoid triggers such as:{causes; exacerbators GERD:13199}.  Currently {CHL Controlled/Uncontrolled:[phone]} on {gerdrx:23825}   Sucralfate 1 gram four times daily - with meals and at bedtime   Plan   {rxplan:23810::"Continue current medication."}   Osteopenia / Osteoporosis   Last DEXA Scan: ***   T-Score femoral neck: ***  T-Score total hip: ***  T-Score lumbar spine: ***  T-Score forearm radius: ***  10-year probability of major osteoporotic fracture: ***  10-year probability of hip fracture: ***  Vit D, 25-Hydroxy  Date Value Ref Range Status  03/03/2017 29 (L) 30 - 100 ng/mL Final    Comment:    Vitamin D Status           25-OH Vitamin D        Deficiency                <20 ng/mL        Insufficiency         20 - 29 ng/mL        Optimal             > or = 30 ng/mL   For 25-OH Vitamin D testing on patients on D2-supplementation and patients for whom quantitation of D2 and D3 fractions is required, the QuestAssureD 25-OH VIT D, (D2,D3), LC/MS/MS is recommended: order code [phone] (patients > 2 yrs).     Patient {is;is not an osteoporosis candidate:23886}  Patient has failed these meds in past:  *** Patient is currently {CHL Controlled/Uncontrolled:[phone]} on the following medications: ***  We discussed:  {Osteoporosis Counseling:23892}  Plan  Continue {CHL HP Upstream Pharmacy Plans:3053058098}   IBS   Patient is currently {CHL Controlled/Uncontrolled:[phone]} on the following medications:  . Hyoscyamine 0.375 mg twice daily  . Linzess 72 mcg daily as needed   We discussed:  ***  Plan  Continue {CHL HP Upstream Pharmacy PH:[id number]   ***   Patient has failed these meds in past: *** Patient is currently {CHL Controlled/Uncontrolled:[phone]} on the following medications:  . ***  We discussed:  ***  Plan  Continue {CHL HP Upstream Pharmacy PH:[id number] ***   Patient has failed these meds in past: *** Patient is currently {CHL Controlled/Uncontrolled:[phone]} on the following medications:  . ***  We discussed:  ***  Plan  Continue {CHL HP Upstream Pharmacy PH:[id number]   ***   Patient has failed these meds in past: *** Patient is currently {CHL Controlled/Uncontrolled:[phone]} on the following medications:  . ***  We discussed:  ***  Plan  Continue {CHL HP Upstream Pharmacy PH:[id number]   Vaccines   Reviewed and discussed patient's vaccination history.    Immunization History  Administered Date(s) Administered  . Fluad Quad(high Dose 65+) 06/20/2019  . Influenza Split 07/29/2011, 06/23/2012  . Influenza, High Dose Seasonal PF 06/26/2017, 06/30/2018  . Influenza,inj,Quad PF,6+ Mos 05/16/2013, 05/22/2014, 08/01/2015  . Td 08/18/1992  . Tdap 09/11/2014    Plan  Recommended patient receive *** vaccine in ***  office/pharmacy.  Medication Management   Receives prescription medications from:  [id number] - , [place] - [address] [place] [address] Phone: [phone] Fax: [phone]   ***  Plan  {[facility]  [id number].  Follow up: *** month phone visit.  *** ______________ Visit Information SDOH (Social Determinants of Health) assessments performed: Yes.  Ms. Freytes was given information about Chronic Care Management services today including:  1. CCM service includes personalized support from designated clinical staff supervised by her physician, including individualized plan of care and coordination with other care providers 2. 24/7 contact phone numbers for assistance for urgent and routine care needs. 3. Standard insurance, coinsurance, copays and deductibles apply for chronic care management only during months in which we provide at least 20 minutes of these services. Most insurances cover these services at 100%, however patients may be responsible for any copay, coinsurance and/or deductible if applicable. This service may help you avoid the need for more expensive face-to-face services. 4. Only one practitioner may furnish and bill the service in a calendar month. 5. The patient may stop CCM services at any time (effective at the end of the month) by phone call to the office staff.  Patient agreed to services and verbal consent obtained.   Madelin Rear, Pharm.D., BCGP Clinical Pharmacist [facility] at [facility] [phone]

## 2020-01-17 NOTE — Telephone Encounter (Signed)
Xanax last rx 12/19/19 #30 LOV: 01/13/20 Back pain CSC: 01/14/18

## 2020-01-18 ENCOUNTER — Telehealth: Payer: Self-pay | Admitting: Physician Assistant

## 2020-01-18 ENCOUNTER — Telehealth: Payer: Self-pay

## 2020-01-18 ENCOUNTER — Ambulatory Visit: Payer: Medicare HMO

## 2020-01-18 ENCOUNTER — Other Ambulatory Visit: Payer: Self-pay

## 2020-01-18 NOTE — Progress Notes (Signed)
  Chronic Care Management   Outreach Note  01/18/2020 Name: Megan [name] MRN: [id number] DOB: [birth year]  Referred by: Brunetta Jeans, PA-C Reason for referral : No chief complaint on file.   An unsuccessful telephone outreach was attempted today. The patient was referred to the pharmacist for assistance with care management and care coordination.   This note is not being shared with the patient for the following reason: To respect privacy (The patient or proxy has requested that the information not be shared).  Follow Up Plan:   [name] Upstream Scheduler

## 2020-01-18 NOTE — Telephone Encounter (Signed)
°  Chronic Care Management   Outreach Note  01/18/2020 Name: Megan [name] MRN: [id number] DOB: 1951/04/23  Referred by: Brunetta Jeans, PA-C Reason for referral: Telephone Appointment with [place] Pharmacist, Madelin Rear.    An unsuccessful telephone outreach was attempted today. The patient was referred to the pharmacist for assistance with care management and care coordination.    Telephone appointment with clinical pharmacist today at 830am. If patient returns call immediately please transfer to ext [phone], otherwise please provide number below to reschedule visit.   Madelin Rear, Pharm.D., BCGP Clinical Pharmacist [facility] at [facility] [phone]

## 2020-01-25 ENCOUNTER — Ambulatory Visit: Payer: Medicare HMO | Admitting: Obstetrics and Gynecology

## 2020-01-25 ENCOUNTER — Encounter: Payer: Self-pay | Admitting: Obstetrics and Gynecology

## 2020-01-25 ENCOUNTER — Other Ambulatory Visit: Payer: Self-pay

## 2020-01-25 VITALS — BP 124/82

## 2020-01-25 DIAGNOSIS — R1032 Left lower quadrant pain: Secondary | ICD-10-CM | POA: Diagnosis not present

## 2020-01-25 DIAGNOSIS — N819 Female genital prolapse, unspecified: Secondary | ICD-10-CM | POA: Diagnosis not present

## 2020-01-25 NOTE — Progress Notes (Signed)
SHARANYA TEMPLIN 1951/03/20 [id number]  SUBJECTIVE:  69 y.o. [id number] female presents for evaluation of left lower quadrant pain.  History of diverticulosis and IBS noted.  She has many 'digestive problems' and follows with GI at [facility] for this.  Review of the outside records indicates she has history of severe GERD with esophagitis and benign biopsies in the past.  She does have a cystocele and rectocele with uterine prolapse. Pain started 2 months ago as a dull pain.  By the end of day it is throbbing pain.  She feels the pain in her left groin area and hurts when pushing on it.  At first she was empirically started on prednisone and she had some sort of 'heart racing' reaction.  Treated a few weeks ago empirically for diverticulitis by her primary care provider with minimal effect.  Looks like she had a colonoscopy June 2019 with moderate diverticulosis.   Feels swollen and bloating across her abdomen.  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  She says her stool habits have been running much more towards constipation lately.  She says she now is having very small caliber stools with dark black color.  Used to have back and forth and constipation and diarrhea with her history of IBS. She says she has spinal deterioration on x-ray and she has osteoporosis. Feels pressure like she needs to go to the restroom but voids minimally when she goes. No vaginal bleeding. No vaginal discharge.     Current Outpatient Medications  Medication Sig Dispense Refill  . albuterol (VENTOLIN HFA) 108 (90 Base) MCG/ACT inhaler Inhale 2 puffs into the lungs every 6 (six) hours as needed for wheezing or shortness of breath. 8 g 0  . ALPRAZolam (XANAX) 1 MG tablet TAKE 1  TABLET BY MOUTH TWICE A DAY AS NEEDED FOR ANXIETY NEEDS APPT FOR REFILLS 30 tablet 0  . aspirin EC 81 MG tablet Take 1 tablet (81 mg total) by mouth daily.    . Biotin 1000 MCG tablet Take 1,000 mcg by mouth 3 (three) times daily.    . Cholecalciferol (VITAMIN D-3) 5000 units TABS Take 1 tablet by mouth daily. 30 tablet 0  . Cyanocobalamin (VITAMIN B-12 PO) Take 1 tablet by mouth daily.    . cyclobenzaprine (FLEXERIL) 10 MG tablet Take 1 tablet (10 mg total) by mouth at bedtime. 15 tablet 0  . diltiazem (CARTIA XT) 180 MG 24 hr capsule Take 1 capsule (180 mg total) by mouth daily. 90 capsule 3  . FLUoxetine (PROZAC) 10 MG capsule TAKE 1 CAPSULE BY MOUTH EVERY DAY 30 capsule 3  . fluticasone (FLONASE) 50 MCG/ACT nasal spray SHAKE LIQUID AND USE 2 SPRAYS IN EACH NOSTRIL DAILY 16 mL 1  . hyoscyamine (LEVBID) 0.375 MG 12 hr tablet Take 1 tablet by mouth as needed.    . linaclotide (LINZESS) 72 MCG capsule Take 72 mcg by mouth daily as needed. constipation    . Multiple Minerals (CALCIUM/MAGNESIUM/ZINC PO) Take 1 tablet by mouth daily.    . ondansetron (ZOFRAN) 4 MG tablet TAKE 1 TABLET BY MOUTH EVERY 8 HOURS AS NEEDED FOR NAUSEA / VOMITING    . sucralfate (CARAFATE) 1 g tablet Take 1 g by mouth 4 (four) times daily -  with meals and at bedtime.    . vitamin E 400 UNIT capsule Take 400 Units by mouth daily.     No current facility-administered medications for this visit.   Allergies: Iodine-131, Sulfamethoxazole, Amitriptyline, Effexor [venlafaxine], Iohexol, Lactose intolerance (gi), Lexapro [escitalopram oxalate], and Iodinated diagnostic agents  No LMP recorded. Patient is postmenopausal.  Past medical history,surgical history, problem list, medications, allergies, family history and social history were all reviewed and documented as reviewed in the EPIC chart.  ROS:  Feeling well. No dyspnea or chest pain on exertion.  +left lower abdominal pain, + change in bowel habits, +black stools.  No  urinary tract symptoms. GYN ROS:  no abnormal bleeding, pelvic pain or discharge, no breast pain or new or enlarging lumps on self exam. No neurological complaints.   OBJECTIVE:  BP 124/82  The patient appears well, alert, oriented x 3, in no distress. ABDOMEN: Soft, mildly distended across lower abdomen, no tenderness on the right, moderately tender with point tenderness over the left lower quadrant, voluntary guarding present, no rebound tenderness, moderate tenderness over the left inguinal ligament also noted, no hernia appreciated PELVIC EXAM: VULVA: normal appearing vulva with no masses, tenderness or lesions, atrophic changes, VAGINA: normal appearing vagina with normal color and discharge, no lesions, grade 2 cystocele with Valsalva, grade 1 rectocele (examined in dorsal lithotomy), CERVIX: normal atrophic appearing cervix without discharge or lesions, mild prolapse of uterus and cervix to grade 1-2, UTERUS: uterus is small size, shape, consistency and nontender, ADNEXA: normal adnexa in size, tenderness over left adnexal region but no palpable masses  Chaperone: Caryn Bee present during the examination  ASSESSMENT:  69 y.o. [id number] here for evaluation of left lower quadrant pain  PLAN:  1. LLQ pain.  I do not think a gynecologic etiology is likely but I would like her to get a pelvic ultrasound for further reassurance that her left ovary is normal.  Her pelvic exam was fairly unremarkable in regards to anything that would be associated with causing her left-sided pain.  She does also seem to have some musculoskeletal tenderness with tenderness of her left inguinal ligament just inferior to where she is having most of her pain in her left abdomen.  Perhaps she needs a PT evaluation if that has not been done previously.  I highly encouraged her to seek follow-up with her GI specialist for further work-up of the LLQ pain, particularly with the changes in her bowel movement frequency and  characteristics.  She says that she is planning to do this soon. 2.  Pelvic organ prolapse.  This would account for her midline vaginal and pelvic pressure sensation and her urinary difficulties.  Her prolapse really is not that prominent on the exam today, however.  I think when she gets the left lower quadrant pain figured out and treated, she could revisit the idea of how to address the pelvic organ prolapse, possibly with a pessary versus a hysterectomy and pelvic organ prolapse repair.  I encouraged the patient to schedule a pelvic ultrasound for a more complete evaluation of her pelvic organs.  She will plan to do this at checkout today.   Joseph Pierini MD 01/25/20

## 2020-01-26 ENCOUNTER — Telehealth: Payer: Self-pay | Admitting: Physician Assistant

## 2020-01-26 NOTE — Progress Notes (Signed)
  Chronic Care Management   Outreach Note  01/26/2020 Name: Megan [name] MRN: [id number] DOB: 11-26-1950  Referred by: Brunetta Jeans, PA-C Reason for referral : No chief complaint on file.   An unsuccessful telephone outreach was attempted today. The patient was referred to the pharmacist for assistance with care management and care coordination.   This note is not being shared with the patient for the following reason: To respect privacy (The patient or proxy has requested that the information not be shared).  Follow Up Plan:   [name] Upstream Scheduler

## 2020-01-27 ENCOUNTER — Encounter: Payer: Self-pay | Admitting: Family Medicine

## 2020-01-27 ENCOUNTER — Ambulatory Visit (INDEPENDENT_AMBULATORY_CARE_PROVIDER_SITE_OTHER): Payer: Medicare HMO | Admitting: Family Medicine

## 2020-01-27 ENCOUNTER — Other Ambulatory Visit: Payer: Self-pay

## 2020-01-27 DIAGNOSIS — M5442 Lumbago with sciatica, left side: Secondary | ICD-10-CM

## 2020-01-27 MED ORDER — BACLOFEN 10 MG PO TABS: 5.0000 mg | ORAL_TABLET | Freq: Three times a day (TID) | ORAL | 3 refills | Status: DC | PRN

## 2020-01-27 MED ORDER — NABUMETONE 500 MG PO TABS: 500.0000 mg | ORAL_TABLET | Freq: Two times a day (BID) | ORAL | 3 refills | Status: DC | PRN

## 2020-01-27 NOTE — Progress Notes (Signed)
Office Visit Note   Patient: Megan [name]           Date of Birth: [birth year]           MRN: [id number] Visit Date: 01/27/2020 Requested by: Brunetta Jeans, PA-C [address] [place],  [place] [id number] PCP: Delorse Limber  Subjective: Chief Complaint  Patient presents with  . Lower Back - Pain    Pain in low back, going down left leg to the knee on the medial side, moves knee and feels it click. Had xrays done 01/13/20    HPI: She is here with left-sided low back pain.  Symptoms started about 2 months ago, no injury.  Gradual onset of pain on the posterior lateral hip with radiation toward the knee.  Recently she was given prednisone but it did not help.  She had x-rays done showing degenerative changes and is now here for further evaluation.  She has had some issues with her back in the past but not lasting quite as long.  Her son is a former patient of mine and has had back surgery.  Patient states that she has struggled with colitis for many years.  Presently she is experiencing constipation rather than diarrhea.  Denies any rash, denies any swelling or redness in any of her other joints.              ROS: No fevers or chills.  All other systems were reviewed and are negative.  Objective: Vital Signs: There were no vitals taken for this visit.  Physical Exam:  General:  Alert and oriented, in no acute distress. Pulm:  Breathing unlabored. Psy:  Normal mood, congruent affect.  Low back: She has no rash.  She is tender to palpation in the left gluteus medius area and the sciatic notch.  No pain along the spine or the SI joints.  Negative straight leg raise, no pain with internal hip rotation.  Lower extremity strength and reflexes are normal.  Imaging: No results found.  Assessment & Plan: 1.  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  Neurologic exam is nonfocal. -Elected to try physical  therapy, baclofen and Relafen as needed.  Home exercises given as well.  If she fails to improve, then lumbar MRI scan or possibly epidural injections. - Also gave info on dietary changes for colitis.     Procedures: No procedures performed  No notes on file     PMFS History: Patient Active Problem List   Diagnosis Date Noted  . Closed fracture of right distal radius 01/12/2019  . Chronic migraine 04/07/2018  . New onset headache 04/07/2018  . Sore throat 01/19/2018  . Moderate obstructive sleep apnea 05/21/2017  . Nausea 04/12/2017  . Vitamin D deficiency 07/17/2016  . Abdominal aortic atherosclerosis ([place]) 07/16/2016  . Renal artery stenosis ([facility]) 06/12/2016  . Chronic low back pain 08/24/2015  . Memory loss 08/24/2015  . New onset of headaches after age 89 04/26/2015  . Headache 04/16/2015  . Hyperlipidemia, mixed 12/17/2014  . Preventative health care 09/11/2014  . Hepatic cyst 05/22/2014  . Insomnia 03/10/2014  . Low back pain 08/15/2013  . 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 Allergic state 12/31/2012  . Osteoporosis 09/07/2012  . 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 Mitral regurgitation 07/21/2011  . Gastroesophageal reflux disease with hiatal hernia 06/17/2011  . Endometrial hyperplasia   . Diverticulosis   .  IBS (irritable bowel syndrome) 04/24/2011  . Barrett's esophagus 03/04/2011  . Hiatal hernia 03/04/2011  . BASAL CELL CARCINOMA, FACE 11/15/2009  . Migraine 11/15/2009  . WEIGHT GAIN 06/05/2009  . Hyperglycemia 06/05/2009  . Vitamin B12 deficiency 02/15/2009  . 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 Essential hypertension 01/04/2009  . ANXIETY DEPRESSION 12/14/2007   Past Medical History:  Diagnosis Date  . 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 Adrenal gland cyst ([facility]) 05/22/2014  . Allergic state 12/31/2012  . Anxiety   . Asthma   . Barrett esophagus   . Blind loop syndrome   . C. difficile diarrhea   . Cancer ([employer])   .  Chest pain, atypical 07/14/2011  . Cold sore 07/17/2016  . Colon polyp   . Contact dermatitis 03/23/2012  . Cystocele, midline   . Depression   . Diarrhea   . Diverticulosis   . Endometrial hyperplasia 02/27/2006   BENIGN ENDO BX ON 02/2007  . GERD (gastroesophageal reflux disease)   . Headache 04/16/2015  . Headache   . Headache(784.0) 04/15/2012  . Hematuria 04/27/2012  . Hepatic cyst   . Hiatal hernia   . Hiatal hernia   . Hyperlipidemia, mixed 12/17/2014  . Hypertension   . IBS (irritable bowel syndrome)   . Insomnia   . Leaky heart valve   . Low back pain 08/15/2013  . Osteoporosis 03/2017   T score -2.5. Without significant loss from prior studies  . 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 Paronychia of great toe, left 06/19/2013  . Preventative health care 09/11/2014  . Rectal bleeding 10/10/2011  . Rectocele   . 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 Sinusitis acute 02/03/2012  . Thrush 07/21/2011  . 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 Unspecified hypothyroidism   . Unspecified menopausal and postmenopausal disorder   . Uterine prolapse without mention of vaginal wall prolapse   . Vitamin B12 deficiency   . Vitamin D deficiency 07/17/2016    Family History  Problem Relation Age of Onset  . Colon cancer Mother   . Diabetes Mother   . Hypertension Mother   . Colon polyps Father   . Parkinson's disease Father   . Colon cancer Maternal Grandmother   . Breast cancer Maternal Grandmother 62  . Diabetes Paternal Grandmother   . Breast cancer Paternal Grandmother 95  . Allergies Sister   . Arthritis Brother        b/l hip replace  . Alcohol abuse Daughter   . Arthritis Son   . Hypertension Son   . Allergies Son   . Osteoporosis Son   . Osteoporosis Son   . Arthritis Son        back disease, DDD  . Allergies Son   . Arthritis Son   . Irritable bowel syndrome Son        RSD  . Hypertension Paternal Grandfather   . Heart disease Paternal Grandfather   . Aneurysm Paternal  Grandfather   . Colon cancer Maternal Aunt   . Colon cancer Cousin        X3  . Ovarian cancer Cousin     Past Surgical History:  Procedure Laterality Date  . APPENDECTOMY    . endoscopic hemoclip    . eye surgery Left    lens implant  . HYSTEROSCOPY     POLYP  . OPEN REDUCTION INTERNAL FIXATION (ORIF) DISTAL RADIAL FRACTURE Left 01/17/2019   Procedure: LEFT OPEN REDUCTION INTERNAL FIXATION (ORIF) DISTAL RADIUS FRACTURE;  Surgeon: Daryll Brod, MD;  Location: [facility];  Service: Orthopedics;  Laterality: Left;  AXILLARY BLOCK  . PERIPHERAL VASCULAR CATHETERIZATION Left 07/24/2016   Procedure: Renal Angiography;  Surgeon: Conrad Manchaca, MD;  Location: [facility];  Service: Cardiovascular;  Laterality: Left;  . UPPER GI ENDOSCOPY    . UTERINE FIBROID SURGERY     Social History   Occupational History  . Occupation: Retired  Tobacco Use  . Smoking status: Former Smoker    Quit date: 08/19/1995    Years since quitting: 24.4  . Smokeless tobacco: Never Used  Vaping Use  . Vaping Use: Never used  Substance and Sexual Activity  . Alcohol use: No    Alcohol/week: 0.0 standard drinks  . Drug use: No    Types: Hydrocodone  . Sexual activity: Not Currently    Birth control/protection: Post-menopausal    Comment: 1st intercourse 25 yo-5 partners

## 2020-01-30 DIAGNOSIS — D3A8 Other benign neuroendocrine tumors: Secondary | ICD-10-CM | POA: Diagnosis not present

## 2020-01-30 DIAGNOSIS — K219 Gastro-esophageal reflux disease without esophagitis: Secondary | ICD-10-CM | POA: Diagnosis not present

## 2020-01-30 DIAGNOSIS — Z800 Family history of malignant neoplasm of digestive organs: Secondary | ICD-10-CM | POA: Diagnosis not present

## 2020-01-30 DIAGNOSIS — Z79899 Other long term (current) drug therapy: Secondary | ICD-10-CM | POA: Diagnosis not present

## 2020-01-30 DIAGNOSIS — Z86018 Personal history of other benign neoplasm: Secondary | ICD-10-CM | POA: Diagnosis not present

## 2020-01-30 DIAGNOSIS — Z86010 Personal history of colonic polyps: Secondary | ICD-10-CM | POA: Diagnosis not present

## 2020-01-30 DIAGNOSIS — R140 Abdominal distension (gaseous): Secondary | ICD-10-CM | POA: Diagnosis not present

## 2020-01-30 DIAGNOSIS — K581 Irritable bowel syndrome with constipation: Secondary | ICD-10-CM | POA: Diagnosis not present

## 2020-01-30 DIAGNOSIS — M6289 Other specified disorders of muscle: Secondary | ICD-10-CM | POA: Diagnosis not present

## 2020-02-01 ENCOUNTER — Encounter: Payer: Self-pay | Admitting: Obstetrics and Gynecology

## 2020-02-01 NOTE — Telephone Encounter (Signed)
She can have a prescription for fluconazole 150 mg p.o. x2 tabs, if no improvement she should come in to get checked out

## 2020-02-02 ENCOUNTER — Encounter: Payer: Self-pay | Admitting: Physician Assistant

## 2020-02-02 MED ORDER — FLUCONAZOLE 150 MG PO TABS: 150.0000 mg | ORAL_TABLET | Freq: Every day | ORAL | 0 refills | Status: DC

## 2020-02-02 NOTE — Telephone Encounter (Signed)
She needs in-office visit. ER for any acute worsening symptoms before evaluation.

## 2020-02-03 ENCOUNTER — Other Ambulatory Visit: Payer: Self-pay

## 2020-02-03 ENCOUNTER — Encounter: Payer: Self-pay | Admitting: Physician Assistant

## 2020-02-03 ENCOUNTER — Ambulatory Visit (INDEPENDENT_AMBULATORY_CARE_PROVIDER_SITE_OTHER): Payer: Medicare HMO | Admitting: Physician Assistant

## 2020-02-03 VITALS — BP 136/66 | HR 82 | Temp 98.5°F | Ht 66.0 in | Wt 146.0 lb

## 2020-02-03 DIAGNOSIS — G8929 Other chronic pain: Secondary | ICD-10-CM

## 2020-02-03 DIAGNOSIS — M5442 Lumbago with sciatica, left side: Secondary | ICD-10-CM | POA: Diagnosis not present

## 2020-02-03 DIAGNOSIS — N819 Female genital prolapse, unspecified: Secondary | ICD-10-CM

## 2020-02-03 DIAGNOSIS — R300 Dysuria: Secondary | ICD-10-CM

## 2020-02-03 LAB — POCT URINALYSIS DIPSTICK
Bilirubin, UA: NEGATIVE
Blood, UA: NEGATIVE
Glucose, UA: NEGATIVE
Ketones, UA: NEGATIVE
Nitrite, UA: NEGATIVE
Protein, UA: NEGATIVE
Spec Grav, UA: 1.01 (ref 1.010–1.025)
Urobilinogen, UA: 0.2 E.U./dL
pH, UA: 6.5 (ref 5.0–8.0)

## 2020-02-03 MED ORDER — CEFDINIR 300 MG PO CAPS: 300.0000 mg | ORAL_CAPSULE | Freq: Two times a day (BID) | ORAL | 0 refills | Status: DC

## 2020-02-03 NOTE — Patient Instructions (Addendum)
Please keep well-hydrated and get plenty of rest.   I do feel that your urinary symptoms are most likely stemming from your uterine/vaginal prolapse for which you need to follow-up with your GYN as scheduled for ultrasound and then to get a vaginal pessary placed.   Your urine is questionable for showing signs of infection. Giving dysuria and that it is a weekend, I will start antibiotic while we await your urine culture results.   Continue care as directed by GI.  I will try to keep an eye out for your Ultrasound results once you have this done.   Hang in there!

## 2020-02-03 NOTE — Progress Notes (Signed)
.  Patient presents to clinic today c/o continued left-sided low back pain with occasional left lower quadrant pain and ongoing urinary frequency and urgency.  Has had assessment here in primary care as well as with orthopedics and gynecology.  On recent visit with orthopedics (01/27/20) they felt that her lower back pain was secondary to lumbar degenerative disc disease with left-sided radiculopathy.  Recommended starting a trial of baclofen, Relafen and physical therapy.  Plan for MRI if not improving.  Patient states she feels that she does not need physical therapy as she works around the house.  Does not feel that her back pain is stemming from her lumbar disc disease that has been proven by prior imaging.  Was also recently seen by gynecology on 01/25/2020 regarding her left lower quadrant pain.  Was evaluated and diagnosed with female genital prolapse.  No cause of left lower quadrant pain found at that time.  Was recommended that she follow-up with gastroenterology regarding the left lower quadrant pain to get further assess before following up with gynecology for vaginal pessary.  Was instructed that her urinary symptoms is coming from the pelvic organ prolapse.  No cystitis identified at that time.  Patient endorses still having daily urinary urgency and frequency.  Denies dysuria.  Feels that if she does has another antibiotic it will help with her symptoms.  In regards to left lower quadrant pain, patient had follow-up with gastroenterology.  Notes resolution of her left lower quadrant pain at this time.  Past Medical History:  Diagnosis Date  . 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 Adrenal gland cyst ([facility]) 05/22/2014  . Allergic state 12/31/2012  . Anxiety   . Asthma   . Barrett esophagus   . Blind loop syndrome   . C. difficile diarrhea   . Cancer ([facility])   . Chest pain, atypical 07/14/2011  . Cold sore 07/17/2016  . Colon polyp   . Contact dermatitis 03/23/2012  . Cystocele, midline   . Depression     . Diarrhea   . Diverticulosis   . Endometrial hyperplasia 02/27/2006   BENIGN ENDO BX ON 02/2007  . GERD (gastroesophageal reflux disease)   . Headache 04/16/2015  . Headache   . Headache(784.0) 04/15/2012  . Hematuria 04/27/2012  . Hepatic cyst   . Hiatal hernia   . Hiatal hernia   . Hyperlipidemia, mixed 12/17/2014  . Hypertension   . IBS (irritable bowel syndrome)   . Insomnia   . Leaky heart valve   . Low back pain 08/15/2013  . Osteoporosis 03/2017   T score -2.5. Without significant loss from prior studies  . 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 Paronychia of great toe, left 06/19/2013  . Preventative health care 09/11/2014  . Rectal bleeding 10/10/2011  . Rectocele   . 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 Sinusitis acute 02/03/2012  . Thrush 07/21/2011  . 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 Unspecified hypothyroidism   . Unspecified menopausal and postmenopausal disorder   . Uterine prolapse without mention of vaginal wall prolapse   . Vitamin B12 deficiency   . Vitamin D deficiency 07/17/2016    Current Outpatient Medications on File Prior to Visit  Medication Sig Dispense Refill  . albuterol (VENTOLIN HFA) 108 (90 Base) MCG/ACT inhaler Inhale 2 puffs into the lungs every 6 (six) hours as needed for wheezing or shortness of breath. 8 g 0  . ALPRAZolam (XANAX) 1 MG tablet TAKE 1 TABLET BY MOUTH TWICE A DAY AS NEEDED FOR ANXIETY NEEDS  APPT FOR REFILLS 30 tablet 0  . aspirin EC 81 MG tablet Take 1 tablet (81 mg total) by mouth daily.    . baclofen (LIORESAL) 10 MG tablet Take 0.5-1 tablets (5-10 mg total) by mouth 3 (three) times daily as needed for muscle spasms. 30 each 3  . Biotin 1000 MCG tablet Take 1,000 mcg by mouth 3 (three) times daily.    . Cholecalciferol (VITAMIN D-3) 5000 units TABS Take 1 tablet by mouth daily. 30 tablet 0  . Cyanocobalamin (VITAMIN B-12 PO) Take 1 tablet by mouth daily.    . cyclobenzaprine (FLEXERIL) 10 MG tablet Take 1 tablet (10 mg total) by mouth  at bedtime. 15 tablet 0  . diltiazem (CARTIA XT) 180 MG 24 hr capsule Take 1 capsule (180 mg total) by mouth daily. 90 capsule 3  . fluconazole (DIFLUCAN) 150 MG tablet Take 1 tablet (150 mg total) by mouth daily. 2 tablet 0  . FLUoxetine (PROZAC) 10 MG capsule TAKE 1 CAPSULE BY MOUTH EVERY DAY 30 capsule 3  . hyoscyamine (LEVBID) 0.375 MG 12 hr tablet Take 1 tablet by mouth as needed.    . linaclotide (LINZESS) 72 MCG capsule Take 72 mcg by mouth daily as needed. constipation    . Multiple Minerals (CALCIUM/MAGNESIUM/ZINC PO) Take 1 tablet by mouth daily.    . nabumetone (RELAFEN) 500 MG tablet Take 1 tablet (500 mg total) by mouth 2 (two) times daily as needed. 60 tablet 3  . ondansetron (ZOFRAN) 4 MG tablet TAKE 1 TABLET BY MOUTH EVERY 8 HOURS AS NEEDED FOR NAUSEA / VOMITING    . sucralfate (CARAFATE) 1 g tablet Take 1 g by mouth 4 (four) times daily -  with meals and at bedtime.    . vitamin E 400 UNIT capsule Take 400 Units by mouth daily.     No current facility-administered medications on file prior to visit.    Allergies  Allergen Reactions  . Iodine-131 Other (See Comments)    Other reaction(s): Dizziness (intolerance)  . Sulfamethoxazole Shortness Of Breath    chest tightness  . Amitriptyline Anxiety and Other (See Comments)    Depression, insomnia  . Effexor [Venlafaxine]     palpitations  . Iohexol Other (See Comments)    Passed out  . Lactose Intolerance (Gi) Diarrhea and Nausea And Vomiting  . Lexapro [Escitalopram Oxalate]     nausea  . Iodinated Diagnostic Agents     Pt states at Dr.Grapeys office 15 years ago blacked out for 2 hours during injection for IVP. Pt says she had dye recently with no problems   . Prednisone Palpitations    Family History  Problem Relation Age of Onset  . Colon cancer Mother   . Diabetes Mother   . Hypertension Mother   . Colon polyps Father   . Parkinson's disease Father   . Colon cancer Maternal Grandmother   . Breast cancer  Maternal Grandmother 69  . Diabetes Paternal Grandmother   . Breast cancer Paternal Grandmother 58  . Allergies Sister   . Arthritis Brother        b/l hip replace  . Alcohol abuse Daughter   . Arthritis Son   . Hypertension Son   . Allergies Son   . Osteoporosis Son   . Osteoporosis Son   . Arthritis Son        back disease, DDD  . Allergies Son   . Arthritis Son   . Irritable bowel syndrome Son  RSD  . Hypertension Paternal Grandfather   . Heart disease Paternal Grandfather   . Aneurysm Paternal Grandfather   . Colon cancer Maternal Aunt   . Colon cancer Cousin        X3  . Ovarian cancer Cousin     Social History   Socioeconomic History  . Marital status: Married    Spouse name: Not on file  . Number of children: 4  . Years of education: 69  . Highest education level: High school graduate  Occupational History  . Occupation: Retired  Tobacco Use  . Smoking status: Former Smoker    Quit date: 08/19/1995    Years since quitting: 24.4  . Smokeless tobacco: Never Used  Vaping Use  . Vaping Use: Never used  Substance and Sexual Activity  . Alcohol use: No    Alcohol/week: 0.0 standard drinks  . Drug use: No    Types: Hydrocodone  . Sexual activity: Not Currently    Birth control/protection: Post-menopausal    Comment: 1st intercourse 3 yo-5 partners  Other Topics Concern  . Not on file  Social History Narrative   Lives at home with her husband.   Right-handed.   2 cups caffeine per day.   Social Determinants of Health   Financial Resource Strain:   . Difficulty of Paying Living Expenses:   Food Insecurity:   . Worried About Charity fundraiser in the Last Year:   . Arboriculturist in the Last Year:   Transportation Needs:   . Film/video editor (Medical):   Marland Kitchen Lack of Transportation (Non-Medical):   Physical Activity:   . Days of Exercise per Week:   . Minutes of Exercise per Session:   Stress:   . Feeling of Stress :   Social  Connections:   . Frequency of Communication with Friends and Family:   . Frequency of Social Gatherings with Friends and Family:   . Attends Religious Services:   . Active Member of Clubs or Organizations:   . Attends Archivist Meetings:   Marland Kitchen Marital Status:    Review of Systems - See HPI.  All other ROS are negative.  BP 136/66   Pulse 82   Temp 98.5 F (36.9 C) (Temporal)   Ht _0  (1.676 m)   Wt 146 lb (66.2 kg)   SpO2 98%   BMI 23.57 kg/m   Physical Exam Constitutional:      General: She is not in acute distress.    Appearance: She is not ill-appearing, toxic-appearing or diaphoretic.  HENT:     Head: Normocephalic and atraumatic.  Eyes:     Conjunctiva/sclera: Conjunctivae normal.  Cardiovascular:     Rate and Rhythm: Normal rate and regular rhythm.     Heart sounds: Normal heart sounds.  Pulmonary:     Effort: Pulmonary effort is normal.     Breath sounds: Normal breath sounds.  Abdominal:     General: Bowel sounds are normal. There is no distension.     Palpations: Abdomen is soft. There is no mass.     Tenderness: There is no abdominal tenderness. There is no guarding.  Musculoskeletal:     Cervical back: Neck supple.  Neurological:     General: No focal deficit present.     Mental Status: She is alert and oriented to person, place, and time.  Psychiatric:        Mood and Affect: Mood normal.     Recent  Results (from the past 2160 hour(s))  CBC w/Diff     Status: None   Collection Time: 01/13/20 11:56 AM  Result Value Ref Range   WBC 8.2 4.0 - 10.5 K/uL   RBC 4.44 3.87 - 5.11 Mil/uL   Hemoglobin 14.0 12.0 - 15.0 g/dL   HCT 42.0 36 - 46 %   MCV 94.5 78.0 - 100.0 fl   MCHC 33.4 30.0 - 36.0 g/dL   RDW 12.5 11.5 - 15.5 %   Platelets 329.0 150 - 400 K/uL   Neutrophils Relative % 56.4 43 - 77 %   Lymphocytes Relative 33.8 12 - 46 %   Monocytes Relative 7.8 3 - 12 %   Eosinophils Relative 1.1 0 - 5 %   Basophils Relative 0.9 0 - 3 %    Neutro Abs 4.6 1.4 - 7.7 K/uL   Lymphs Abs 2.8 0.7 - 4.0 K/uL   Monocytes Absolute 0.6 0 - 1 K/uL   Eosinophils Absolute 0.1 0 - 0 K/uL   Basophils Absolute 0.1 0 - 0 K/uL  Comp Met (CMET)     Status: Abnormal   Collection Time: 01/13/20 11:56 AM  Result Value Ref Range   Sodium 139 135 - 145 mEq/L   Potassium 3.9 3.5 - 5.1 mEq/L   Chloride 102 96 - 112 mEq/L   CO2 28 19 - 32 mEq/L   Glucose, Bld 108 (H) 70 - 99 mg/dL   BUN 6 6 - 23 mg/dL   Creatinine, Ser 0.62 0.40 - 1.20 mg/dL   Total Bilirubin 0.3 0.2 - 1.2 mg/dL   Alkaline Phosphatase 89 39 - 117 U/L   AST 17 0 - 37 U/L   ALT 14 0 - 35 U/L   Total Protein 7.2 6.0 - 8.3 g/dL   Albumin 4.4 3.5 - 5.2 g/dL   GFR 95.30 >60.00 mL/min   Calcium 9.4 8.4 - 10.5 mg/dL  POCT Urinalysis Dipstick     Status: Normal   Collection Time: 01/13/20 12:09 PM  Result Value Ref Range   Color, UA yellow    Clarity, UA clear    Glucose, UA Negative Negative   Bilirubin, UA negative    Ketones, UA negative    Spec Grav, UA 1.010 1.010 - 1.025   Blood, UA negative    pH, UA 6.0 5.0 - 8.0   Protein, UA Negative Negative   Urobilinogen, UA 0.2 0.2 or 1.0 E.U./dL   Nitrite, UA negative    Leukocytes, UA Negative Negative   Appearance     Odor      Assessment/Plan: 1. Chronic left-sided low back pain with left-sided sciatica Discussed with patient that I agree with her orthopedics assessment.  Even though she has some other things going on the bulk of her back pains are stemming from likely disc compression.  Recommend she proceed with physical therapy and the treatment recommended by specialist.  Discussed that MRI is a good idea and they will proceed with that if she is not responding to physical therapy.  She will give some thought to this.  2. Dysuria Feel that her symptoms are stemming from her female organ prolapse as discussed by gynecology.  UA today unremarkable.  Given patient concerns of recurrent infection (she does have history of  UTI) and her persistence that this be treated, will Rx cefdinir while waiting for culture results.  Discussed with her if negative she will receive no further antibiotics for these urinary symptoms.  Will need to  follow-up with her gynecologist for further management. - POCT Urinalysis Dipstick - Urine Culture  3. Female genital prolapse, unspecified type Patient to follow-up with gynecology for management.  Pessary recommended.  Agreed she should proceed with this.  Feel it would alleviate a lot of her symptoms.   This visit occurred during the SARS-CoV-2 public health emergency.  Safety protocols were in place, including screening questions prior to the visit, additional usage of staff PPE, and extensive cleaning of exam room while observing appropriate contact time as indicated for disinfecting solutions.     Leeanne Rio, PA-C

## 2020-02-04 LAB — URINE CULTURE
MICRO NUMBER:: 10609724
Result:: NO GROWTH
SPECIMEN QUALITY:: ADEQUATE

## 2020-02-08 ENCOUNTER — Encounter: Payer: Medicare HMO | Admitting: Obstetrics and Gynecology

## 2020-02-21 ENCOUNTER — Other Ambulatory Visit: Payer: Self-pay | Admitting: Physician Assistant

## 2020-02-21 ENCOUNTER — Ambulatory Visit: Payer: Medicare HMO | Admitting: Rehabilitative and Restorative Service Providers"

## 2020-02-21 DIAGNOSIS — F341 Dysthymic disorder: Secondary | ICD-10-CM

## 2020-03-01 ENCOUNTER — Encounter: Payer: Self-pay | Admitting: Obstetrics and Gynecology

## 2020-03-01 ENCOUNTER — Other Ambulatory Visit: Payer: Self-pay

## 2020-03-01 ENCOUNTER — Ambulatory Visit: Payer: Medicare HMO | Admitting: Obstetrics and Gynecology

## 2020-03-01 ENCOUNTER — Ambulatory Visit (INDEPENDENT_AMBULATORY_CARE_PROVIDER_SITE_OTHER): Payer: Medicare HMO

## 2020-03-01 VITALS — BP 128/80

## 2020-03-01 DIAGNOSIS — R1032 Left lower quadrant pain: Secondary | ICD-10-CM | POA: Diagnosis not present

## 2020-03-01 DIAGNOSIS — N854 Malposition of uterus: Secondary | ICD-10-CM

## 2020-03-01 DIAGNOSIS — N819 Female genital prolapse, unspecified: Secondary | ICD-10-CM

## 2020-03-01 DIAGNOSIS — E559 Vitamin D deficiency, unspecified: Secondary | ICD-10-CM

## 2020-03-01 NOTE — Progress Notes (Signed)
Megan [name] Mar 03, 1951 [id number]  SUBJECTIVE:  69 y.o. [id number] female presents for a pelvic ultrasound to rule out gynecologic causes of left lower quadrant pain.  In the interim since her visit 5 weeks ago her pain had resolved.  She does feel it is related to bowel issues as she has had chronic GI problems in the past.  She does have pelvic organ prolapse and has noted increased urinary frequency but no dysuria symptoms.  She is emptying her bladder okay in general.  Current Outpatient Medications  Medication Sig Dispense Refill  . albuterol (VENTOLIN HFA) 108 (90 Base) MCG/ACT inhaler Inhale 2 puffs into the lungs every 6 (six) hours as needed for wheezing or shortness of breath. 8 g 0  . ALPRAZolam (XANAX) 1 MG tablet TAKE 1 TABLET BY MOUTH TWICE A DAY AS NEEDED FOR ANXIETY NEEDS APPT FOR REFILLS 30 tablet 0  . aspirin EC 81 MG tablet Take 1 tablet (81 mg total) by mouth daily.    . baclofen (LIORESAL) 10 MG tablet Take 0.5-1 tablets (5-10 mg total) by mouth 3 (three) times daily as needed for muscle spasms. 30 each 3  . Biotin 1000 MCG tablet Take 1,000 mcg by mouth 3 (three) times daily.    . Cholecalciferol (VITAMIN D-3) 5000 units TABS Take 1 tablet by mouth daily. 30 tablet 0  . Cyanocobalamin (VITAMIN B-12 PO) Take 1 tablet by mouth daily.    Marland Kitchen diltiazem (CARTIA XT) 180 MG 24 hr capsule Take 1 capsule (180 mg total) by mouth daily. 90 capsule 3  . fluconazole (DIFLUCAN) 150 MG tablet Take 1 tablet (150 mg total) by mouth daily. 2 tablet 0  . FLUoxetine (PROZAC) 10 MG capsule TAKE 1 CAPSULE BY MOUTH EVERY DAY 30 capsule 3  . hyoscyamine (LEVBID) 0.375 MG 12 hr tablet Take 1 tablet by mouth as needed.    . linaclotide (LINZESS) 72 MCG capsule Take 72 mcg by mouth daily as needed. constipation    . Multiple Minerals (CALCIUM/MAGNESIUM/ZINC PO) Take 1 tablet by mouth daily.    . ondansetron (ZOFRAN) 4 MG tablet TAKE 1 TABLET BY MOUTH EVERY 8 HOURS AS NEEDED FOR NAUSEA / VOMITING     . sucralfate (CARAFATE) 1 g tablet Take 1 g by mouth 4 (four) times daily -  with meals and at bedtime.    . vitamin E 400 UNIT capsule Take 400 Units by mouth daily.     No current facility-administered medications for this visit.   Allergies: Iodine-131, Sulfamethoxazole, Amitriptyline, Effexor [venlafaxine], Iohexol, Lactose intolerance (gi), Lexapro [escitalopram oxalate], Iodinated diagnostic agents, and Prednisone  No LMP recorded. Patient is postmenopausal.  Past medical history,surgical history, problem list, medications, allergies, family history and social history were all reviewed and documented as reviewed in the EPIC chart.   OBJECTIVE:  BP 128/80  The patient appears well, alert, oriented x 3, in no distress. PELVIC EXAM: Deferred as this was performed at last visit  Pelvic ultrasound Retroverted uterus 5.8 x 4.1 x 3.2 cm No myometrial masses. Thin symmetrical endometrium 1.6 mm. Bilateral ovaries atrophic. No adnexal masses.  No free fluid.  ASSESSMENT:  69 y.o. [id number] here for pelvic ultrasound to evaluate left adnexa, pelvic organ prolapse.  PLAN:  The patient is reassured that her pelvic ultrasound is normal and there are no abnormalities in the adnexa.  Her LLQ pain already had spontaneously resolved.  Would recommend GI follow-up if that pain is recurring.  In regards to the pelvic organ  prolapse we reviewed options of surveillance, trial of pessary, or referral for pelvic organ prolapse surgery.  She would prefer just to watch for worsening of the symptoms for now and may consider a trial of pessary as she would like to avoid surgery.  She will let us know if she wants to proceed.  Joseph Pierini MD 03/01/20

## 2020-03-02 LAB — VITAMIN D 25 HYDROXY (VIT D DEFICIENCY, FRACTURES): Vit D, 25-Hydroxy: 51 ng/mL (ref 30–100)

## 2020-03-12 DIAGNOSIS — R140 Abdominal distension (gaseous): Secondary | ICD-10-CM | POA: Diagnosis not present

## 2020-03-12 DIAGNOSIS — Z85060 Personal history of malignant carcinoid tumor of small intestine: Secondary | ICD-10-CM | POA: Insufficient documentation

## 2020-03-12 DIAGNOSIS — Z800 Family history of malignant neoplasm of digestive organs: Secondary | ICD-10-CM | POA: Diagnosis not present

## 2020-03-12 DIAGNOSIS — Z85068 Personal history of other malignant neoplasm of small intestine: Secondary | ICD-10-CM | POA: Diagnosis not present

## 2020-03-13 ENCOUNTER — Encounter: Payer: Self-pay | Admitting: Obstetrics and Gynecology

## 2020-03-15 ENCOUNTER — Encounter: Payer: Medicare HMO | Admitting: Obstetrics and Gynecology

## 2020-03-21 ENCOUNTER — Ambulatory Visit: Payer: Medicare HMO | Admitting: Obstetrics and Gynecology

## 2020-03-21 ENCOUNTER — Other Ambulatory Visit: Payer: Self-pay

## 2020-03-21 ENCOUNTER — Encounter: Payer: Medicare HMO | Admitting: Obstetrics and Gynecology

## 2020-03-21 ENCOUNTER — Encounter: Payer: Self-pay | Admitting: Obstetrics and Gynecology

## 2020-03-21 VITALS — BP 130/80 | Ht 66.0 in | Wt 144.0 lb

## 2020-03-21 DIAGNOSIS — Z9189 Other specified personal risk factors, not elsewhere classified: Secondary | ICD-10-CM | POA: Diagnosis not present

## 2020-03-21 DIAGNOSIS — N8111 Cystocele, midline: Secondary | ICD-10-CM | POA: Diagnosis not present

## 2020-03-21 DIAGNOSIS — M810 Age-related osteoporosis without current pathological fracture: Secondary | ICD-10-CM | POA: Diagnosis not present

## 2020-03-21 DIAGNOSIS — Z124 Encounter for screening for malignant neoplasm of cervix: Secondary | ICD-10-CM

## 2020-03-21 DIAGNOSIS — E559 Vitamin D deficiency, unspecified: Secondary | ICD-10-CM

## 2020-03-21 DIAGNOSIS — N819 Female genital prolapse, unspecified: Secondary | ICD-10-CM

## 2020-03-21 DIAGNOSIS — Z01419 Encounter for gynecological examination (general) (routine) without abnormal findings: Secondary | ICD-10-CM

## 2020-03-21 DIAGNOSIS — R350 Frequency of micturition: Secondary | ICD-10-CM

## 2020-03-21 NOTE — Progress Notes (Signed)
Megan [name] 1951/03/09 [id number]  SUBJECTIVE:  69 y.o. [id number] female for annual routine breast and pelvic exam and Pap smear.  Also here for pessary fitting as was discussed at her last visit for cystocele/pelvic organ prolapse.  She has had increased urinary frequency and requiring her to get up several times overnight to use the restroom.  No burning/dysuria. She has no other gynecologic concerns.   Current Outpatient Medications  Medication Sig Dispense Refill  . albuterol (VENTOLIN HFA) 108 (90 Base) MCG/ACT inhaler Inhale 2 puffs into the lungs every 6 (six) hours as needed for wheezing or shortness of breath. 8 g 0  . ALPRAZolam (XANAX) 1 MG tablet TAKE 1 TABLET BY MOUTH TWICE A DAY AS NEEDED FOR ANXIETY NEEDS APPT FOR REFILLS 30 tablet 0  . aspirin EC 81 MG tablet Take 1 tablet (81 mg total) by mouth daily.    . baclofen (LIORESAL) 10 MG tablet Take 0.5-1 tablets (5-10 mg total) by mouth 3 (three) times daily as needed for muscle spasms. 30 each 3  . Biotin 1000 MCG tablet Take 1,000 mcg by mouth 3 (three) times daily.    . Cholecalciferol (VITAMIN D-3) 5000 units TABS Take 1 tablet by mouth daily. 30 tablet 0  . Cyanocobalamin (VITAMIN B-12 PO) Take 1 tablet by mouth daily.    Marland Kitchen diltiazem (CARTIA XT) 180 MG 24 hr capsule Take 1 capsule (180 mg total) by mouth daily. 90 capsule 3  . fluconazole (DIFLUCAN) 150 MG tablet Take 1 tablet (150 mg total) by mouth daily. 2 tablet 0  . FLUoxetine (PROZAC) 10 MG capsule TAKE 1 CAPSULE BY MOUTH EVERY DAY 30 capsule 3  . hyoscyamine (LEVBID) 0.375 MG 12 hr tablet Take 1 tablet by mouth as needed.    . linaclotide (LINZESS) 72 MCG capsule Take 72 mcg by mouth daily as needed. constipation    . Multiple Minerals (CALCIUM/MAGNESIUM/ZINC PO) Take 1 tablet by mouth daily.    . ondansetron (ZOFRAN) 4 MG tablet TAKE 1 TABLET BY MOUTH EVERY 8 HOURS AS NEEDED FOR NAUSEA / VOMITING    . sucralfate (CARAFATE) 1 g tablet Take 1 g by mouth 4 (four)  times daily -  with meals and at bedtime.    . vitamin E 400 UNIT capsule Take 400 Units by mouth daily.     No current facility-administered medications for this visit.   Allergies: Iodine-131, Sulfamethoxazole, Amitriptyline, Effexor [venlafaxine], Iohexol, Lactose intolerance (gi), Lexapro [escitalopram oxalate], Iodinated diagnostic agents, and Prednisone  No LMP recorded. Patient is postmenopausal.  Past medical history,surgical history, problem list, medications, allergies, family history and social history were all reviewed and documented as reviewed in the EPIC chart.  ROS:  Feeling well. No dyspnea or chest pain on exertion.  No abdominal pain, change in bowel habits, black or bloody stools.  No urinary tract symptoms. GYN ROS: no abnormal bleeding, pelvic pain or discharge, no breast pain or new or enlarging lumps on self exam.No neurological complaints.    OBJECTIVE:  BP 130/80 (BP Location: Right Arm, Patient Position: Sitting, Cuff Size: Normal)   Ht 5\' 6"  (1.676 m)   Wt 144 lb (65.3 kg)   BMI 23.24 kg/m  The patient appears well, alert, oriented x 3, in no distress. ENT normal.  Neck supple. No cervical or supraclavicular adenopathy or thyromegaly.  Lungs are clear, good air entry, no wheezes, rhonchi or rales. S1 and S2 normal, no murmurs, regular rate and rhythm.  Abdomen soft without tenderness,  guarding, mass or organomegaly.  Neurological is normal, no focal findings.  BREAST EXAM: breasts appear normal, no suspicious masses, no skin or nipple changes or axillary nodes  PELVIC EXAM: VULVA: normal appearing vulva with no masses, tenderness or lesions, atrophic changes, VAGINA: normal appearing vagina with normal color and discharge, no lesions, grade 2 cystocele with Valsalva, grade 1 rectocele (examined in dorsal lithotomy), CERVIX: normal atrophic appearing cervix without discharge or lesions, mild prolapse of uterus and cervix to grade 1-2, UTERUS: uterus is small  size, shape, consistency and nontender, ADNEXA: normal adnexa in size, tenderness over left adnexal region but no palpable masses UA unremarkable   A size 4 ring pessary with support is inserted into the vagina without difficulty.  The patient is allowed to sit up and get up, ambulate around the room, there were no issues with any of that.  The patient did not feel any pain or discomfort with the pessary in place.  Repeat examination indicated the pessary remained in place.  The pessary was then removed and the patient tolerated this well.  Chaperone: KimAlexis Bonham present during the examination  ASSESSMENT:  69 y.o. [id number] here for annual breast and pelvic exam  PLAN:   1. Postmenopausal.  No significant hot flashes or night sweats.  No vaginal bleeding. 2. Pap smear 01/2015.  No history of abnormal Pap smears.  Not comfortable with stopping screening, so we did collect a Pap smear today. 3. Mammogram 11/2018.  Normal breast exam today.  She is reminded to schedule an annual mammogram this year when due. 4.  Pelvic organ prolapse/cystocele.  Patient is reassured that she does not have a UTI based on today's UA.  Increased urinary frequency is likely made worse by incomplete bladder emptying due to the cystocele.  She was fitted for a pessary today, size 4 ring with support, and tolerated this well.  We will order the device and have her return to have it placed when it arrives.  We discussed basic pessary maintenance including visits every 3 to 4 months for pessary maintenance, possible increased vaginal discharge, possibility for vaginal erosions, expulsion of the device.  She is in agreement and happy that the pessary seemed to help her symptoms during today's trial. 5. Colonoscopy 2019.  Recommended that she follow up at the recommended interval.   6. Osteoporosis.  DEXA 03/2017.  T score -2.5 left femoral neck, some BMD loss at left hip noted, but overall stable.  History of esophagitis noted  so bisphosphonates would not be good.  Since it has been 3 years we will plan to recheck the DEXA and if any significant worsening of BMD then we should seriously consider Prolia.  History of vitamin D deficiency noted, vitamin D level 2 weeks ago was well into the normal range, she is on 5000 IU vitamin D per day and will continue at that dose. 7. Health maintenance.  No labs today as she normally has these completed with her primary care doctor.  Additional time was spent in today's encounter above and beyond the normal breast and pelvic exam to perform the pessary fitting.   Joseph Pierini MD 03/21/20

## 2020-03-22 ENCOUNTER — Other Ambulatory Visit: Payer: Self-pay | Admitting: Physician Assistant

## 2020-03-22 DIAGNOSIS — F341 Dysthymic disorder: Secondary | ICD-10-CM

## 2020-03-22 LAB — PAP IG W/ RFLX HPV ASCU

## 2020-03-23 LAB — CULTURE INDICATED

## 2020-03-23 LAB — URINE CULTURE
MICRO NUMBER:: 10786679
Result:: NO GROWTH
SPECIMEN QUALITY:: ADEQUATE

## 2020-03-23 LAB — URINALYSIS, COMPLETE W/RFL CULTURE
Bacteria, UA: NONE SEEN /HPF
Bilirubin Urine: NEGATIVE
Glucose, UA: NEGATIVE
Hgb urine dipstick: NEGATIVE
Hyaline Cast: NONE SEEN /LPF
Ketones, ur: NEGATIVE
Nitrites, Initial: NEGATIVE
Protein, ur: NEGATIVE
RBC / HPF: NONE SEEN /HPF (ref 0–2)
Specific Gravity, Urine: 1.004 (ref 1.001–1.03)
pH: 6.5 (ref 5.0–8.0)

## 2020-03-23 NOTE — Telephone Encounter (Signed)
Xanax last rx 02/21/20 #30 LOV: 02/03/20 Dysuria, back pain, female issues CSC: 01/14/18

## 2020-03-26 ENCOUNTER — Encounter: Payer: Self-pay | Admitting: Gynecology

## 2020-03-29 ENCOUNTER — Telehealth: Payer: Self-pay | Admitting: Physician Assistant

## 2020-03-29 NOTE — Progress Notes (Signed)
°  Chronic Care Management   Outreach Note  03/29/2020 Name: COPPER KIRTLEY MRN: [id number] DOB: [birth year]  Referred by: Brunetta Jeans, PA-C Reason for referral : No chief complaint on file.   An unsuccessful telephone outreach was attempted today. The patient was referred to the pharmacist for assistance with care management and care coordination.   Follow Up Plan:   [name] Upstream Scheduler

## 2020-04-17 MED ORDER — METOPROLOL SUCCINATE ER 25 MG PO TB24
25.0000 mg | ORAL_TABLET | Freq: Every day | ORAL | 1 refills | Status: DC
Start: 2020-04-17 — End: 2020-10-04

## 2020-04-17 NOTE — Telephone Encounter (Signed)
Sherren Mocha, MD  Adron Bene C 1 hour ago (2:26 PM)   Yes what I would recommend is that you stay on the cardizem 180 mg and we start metoprolol succinate 25 mg daily. I will ask my nurse, Valetta Fuller, to call it in for you. thanks   Sent in new prescription for Metoprolol succinate 25 mg po daily to the pts confirmed pharmacy of choice.  Pt made aware to continue cardizem 180 mg and to start new medication metoprolol succinate 25 mg po daily, via [employer].  Pt verbalized understanding and agrees with this plan.

## 2020-04-18 ENCOUNTER — Encounter: Payer: Self-pay | Admitting: Physician Assistant

## 2020-04-18 ENCOUNTER — Other Ambulatory Visit: Payer: Self-pay

## 2020-04-18 ENCOUNTER — Ambulatory Visit (INDEPENDENT_AMBULATORY_CARE_PROVIDER_SITE_OTHER): Payer: Medicare HMO | Admitting: Physician Assistant

## 2020-04-18 VITALS — BP 118/78 | HR 62 | Temp 98.3°F | Resp 16 | Ht 66.0 in | Wt 140.0 lb

## 2020-04-18 DIAGNOSIS — G8929 Other chronic pain: Secondary | ICD-10-CM | POA: Diagnosis not present

## 2020-04-18 DIAGNOSIS — R350 Frequency of micturition: Secondary | ICD-10-CM

## 2020-04-18 DIAGNOSIS — R300 Dysuria: Secondary | ICD-10-CM | POA: Diagnosis not present

## 2020-04-18 DIAGNOSIS — R519 Headache, unspecified: Secondary | ICD-10-CM

## 2020-04-18 LAB — POCT URINALYSIS DIPSTICK
Bilirubin, UA: NEGATIVE
Blood, UA: NEGATIVE
Glucose, UA: NEGATIVE
Ketones, UA: NEGATIVE
Nitrite, UA: NEGATIVE
Protein, UA: NEGATIVE
Spec Grav, UA: 1.01 (ref 1.010–1.025)
Urobilinogen, UA: 0.2 E.U./dL
pH, UA: 7 (ref 5.0–8.0)

## 2020-04-18 LAB — SEDIMENTATION RATE: Sed Rate: 10 mm/hr (ref 0–30)

## 2020-04-18 NOTE — Progress Notes (Signed)
Patient presents to clinic today c/o continued urinary urgency and frequency.  This has been an ongoing issue for patient.  Was most recently evaluated by her gynecologist for this at which time examination revealed signs of a cystocele and pelvic organ prolapse.  Urinalysis and urine culture at that time were negative.  As such symptoms were suspected to stem from prolapse.  Patient was fitted for a pessary to help treat this.  States she told her not to order until she can speak with her primary care about current symptoms.  Patient is wanting to discuss if we feel that these things could be contributing to her symptoms and that there is no sign of infection.  Past Medical History:  Diagnosis Date  . 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 Allergic state 12/31/2012  . Anxiety   . Asthma   . Barrett esophagus   . Blind loop syndrome   . C. difficile diarrhea   . Cancer ([place])   . Chest pain, atypical 07/14/2011  . Cold sore 07/17/2016  . Colon polyp   . Contact dermatitis 03/23/2012  . Cystocele, midline   . Depression   . Diarrhea   . Diverticulosis   . Endometrial hyperplasia 02/27/2006   BENIGN ENDO BX ON 02/2007  . GERD (gastroesophageal reflux disease)   . Headache 04/16/2015  . Headache   . Headache(784.0) 04/15/2012  . Hematuria 04/27/2012  . Hepatic cyst   . Hiatal hernia   . Hiatal hernia   . Hyperlipidemia, mixed 12/17/2014  . Hypertension   . IBS (irritable bowel syndrome)   . Insomnia   . Leaky heart valve   . Low back pain 08/15/2013  . Osteoporosis 03/2017   T score -2.5. Without significant loss from prior studies  . 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 Paronychia of great toe, left 06/19/2013  . Preventative health care 09/11/2014  . Rectal bleeding 10/10/2011  . Rectocele   . 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 Sinusitis acute 02/03/2012  . Thrush 07/21/2011  . 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 Unspecified hypothyroidism   . Unspecified  menopausal and postmenopausal disorder   . Uterine prolapse without mention of vaginal wall prolapse   . Vitamin B12 deficiency   . Vitamin D deficiency 07/17/2016    Current Outpatient Medications on File Prior to Visit  Medication Sig Dispense Refill  . albuterol (VENTOLIN HFA) 108 (90 Base) MCG/ACT inhaler Inhale 2 puffs into the lungs every 6 (six) hours as needed for wheezing or shortness of breath. 8 g 0  . ALPRAZolam (XANAX) 1 MG tablet TAKE 1 TABLET BY MOUTH TWICE A DAY AS NEEDED NEEDS APPT FOR REFILLS 30 tablet 0  . aspirin EC 81 MG tablet Take 1 tablet (81 mg total) by mouth daily.    . baclofen (LIORESAL) 10 MG tablet Take 0.5-1 tablets (5-10 mg total) by mouth 3 (three) times daily as needed for muscle spasms. 30 each 3  . Biotin 1000 MCG tablet Take 1,000 mcg by mouth 3 (three) times daily.    . Cholecalciferol (VITAMIN D-3) 5000 units TABS Take 1 tablet by mouth daily. 30 tablet 0  . Cyanocobalamin (VITAMIN B-12 PO) Take 1 tablet by mouth daily.    Marland Kitchen diltiazem (CARTIA XT) 180 MG 24 hr capsule Take 1 capsule (180 mg total) by mouth daily. 90 capsule 3  . fluconazole (DIFLUCAN) 150 MG tablet Take 1 tablet (150 mg total) by mouth daily. 2 tablet 0  .  FLUoxetine (PROZAC) 10 MG capsule TAKE 1 CAPSULE BY MOUTH EVERY DAY 30 capsule 3  . hyoscyamine (LEVBID) 0.375 MG 12 hr tablet Take 1 tablet by mouth as needed.    . linaclotide (LINZESS) 72 MCG capsule Take 72 mcg by mouth daily as needed. constipation    . metoprolol succinate (TOPROL XL) 25 MG 24 hr tablet Take 1 tablet (25 mg total) by mouth daily. 90 tablet 1  . Multiple Minerals (CALCIUM/MAGNESIUM/ZINC PO) Take 1 tablet by mouth daily.    . ondansetron (ZOFRAN) 4 MG tablet TAKE 1 TABLET BY MOUTH EVERY 8 HOURS AS NEEDED FOR NAUSEA / VOMITING    . sucralfate (CARAFATE) 1 g tablet Take 1 g by mouth 4 (four) times daily -  with meals and at bedtime.    . vitamin E 400 UNIT capsule Take 400 Units by mouth daily.     No current  facility-administered medications on file prior to visit.    Allergies  Allergen Reactions  . Iodine-131 Other (See Comments)    Other reaction(s): Dizziness (intolerance)  . Sulfamethoxazole Shortness Of Breath    chest tightness  . Amitriptyline Anxiety and Other (See Comments)    Depression, insomnia  . Effexor [Venlafaxine]     palpitations  . Iohexol Other (See Comments)    Passed out  . Lactose Intolerance (Gi) Diarrhea and Nausea And Vomiting  . Lexapro [Escitalopram Oxalate]     nausea  . Iodinated Diagnostic Agents     Pt states at Dr.Grapeys office 15 years ago blacked out for 2 hours during injection for IVP. Pt says she had dye recently with no problems   . Prednisone Palpitations    Family History  Problem Relation Age of Onset  . Colon cancer Mother   . Diabetes Mother   . Hypertension Mother   . Colon polyps Father   . Parkinson's disease Father   . Colon cancer Maternal Grandmother   . Breast cancer Maternal Grandmother 34  . Diabetes Paternal Grandmother   . Breast cancer Paternal Grandmother 50  . Allergies Sister   . Arthritis Brother        b/l hip replace  . Alcohol abuse Daughter   . Arthritis Son   . Hypertension Son   . Allergies Son   . Osteoporosis Son   . Osteoporosis Son   . Arthritis Son        back disease, DDD  . Allergies Son   . Arthritis Son   . Irritable bowel syndrome Son        RSD  . Hypertension Paternal Grandfather   . Heart disease Paternal Grandfather   . Aneurysm Paternal Grandfather   . Colon cancer Maternal Aunt   . Colon cancer Cousin        X3  . Ovarian cancer Cousin     Social History   Socioeconomic History  . Marital status: Married    Spouse name: Not on file  . Number of children: 4  . Years of education: 66  . Highest education level: High school graduate  Occupational History  . Occupation: Retired  Tobacco Use  . Smoking status: Former Smoker    Quit date: 08/19/1995    Years since quitting:  24.6  . Smokeless tobacco: Never Used  Vaping Use  . Vaping Use: Never used  Substance and Sexual Activity  . Alcohol use: No    Alcohol/week: 0.0 standard drinks  . Drug use: No    Types: Hydrocodone  .  Sexual activity: Not Currently    Birth control/protection: Post-menopausal    Comment: 1st intercourse 65 yo-5 partners  Other Topics Concern  . Not on file  Social History Narrative   Lives at home with her husband.   Right-handed.   2 cups caffeine per day.   Social Determinants of Health   Financial Resource Strain:   . Difficulty of Paying Living Expenses: Not on file  Food Insecurity:   . Worried About Charity fundraiser in the Last Year: Not on file  . Ran Out of Food in the Last Year: Not on file  Transportation Needs:   . Lack of Transportation (Medical): Not on file  . Lack of Transportation (Non-Medical): Not on file  Physical Activity:   . Days of Exercise per Week: Not on file  . Minutes of Exercise per Session: Not on file  Stress:   . Feeling of Stress : Not on file  Social Connections:   . Frequency of Communication with Friends and Family: Not on file  . Frequency of Social Gatherings with Friends and Family: Not on file  . Attends Religious Services: Not on file  . Active Member of Clubs or Organizations: Not on file  . Attends Archivist Meetings: Not on file  . Marital Status: Not on file    Review of Systems - See HPI.  All other ROS are negative.  BP 118/78   Pulse 62   Temp 98.3 F (36.8 C) (Temporal)   Resp 16   Ht 5\' 6"  (1.676 m)   Wt 140 lb (63.5 kg)   SpO2 99%   BMI 22.60 kg/m   Physical Exam Vitals reviewed.  Constitutional:      Appearance: Normal appearance.  HENT:     Head: Normocephalic and atraumatic.  Cardiovascular:     Rate and Rhythm: Normal rate and regular rhythm.     Pulses: Normal pulses.     Heart sounds: Normal heart sounds.  Pulmonary:     Effort: Pulmonary effort is normal.  Abdominal:      General: Bowel sounds are normal. There is no distension.     Palpations: Abdomen is soft.     Tenderness: There is no abdominal tenderness.  Musculoskeletal:     Cervical back: Neck supple.  Neurological:     Mental Status: She is alert.  Psychiatric:        Mood and Affect: Mood normal.     Recent Results (from the past 2160 hour(s))  POCT Urinalysis Dipstick     Status: Abnormal   Collection Time: 02/03/20 10:53 AM  Result Value Ref Range   Color, UA Light yellow    Clarity, UA clear    Glucose, UA Negative Negative   Bilirubin, UA Negative    Ketones, UA Negative    Spec Grav, UA 1.010 1.010 - 1.025   Blood, UA Negative    pH, UA 6.5 5.0 - 8.0   Protein, UA Negative Negative   Urobilinogen, UA 0.2 0.2 or 1.0 E.U./dL   Nitrite, UA Negative    Leukocytes, UA Large (3+) (A) Negative   Appearance     Odor    Urine Culture     Status: None   Collection Time: 02/03/20 11:06 AM   Specimen: Urine  Result Value Ref Range   MICRO NUMBER: [id number]    SPECIMEN QUALITY: Adequate    Sample Source URINE    STATUS: FINAL    Result: No Growth  VITAMIN D 25 Hydroxy (Vit-D Deficiency, Fractures)     Status: None   Collection Time: 03/01/20 12:28 PM  Result Value Ref Range   Vit D, 25-Hydroxy 51 30 - 100 ng/mL    Comment: Vitamin D Status         25-OH Vitamin D: . Deficiency:                    <20 ng/mL Insufficiency:             20 - 29 ng/mL Optimal:                 > or = 30 ng/mL . For 25-OH Vitamin D testing on patients on  D2-supplementation and patients for whom quantitation  of D2 and D3 fractions is required, the [facility](TM) 25-OH VIT D, (D2,D3), LC/MS/MS is recommended: order  code [id number] (patients >69yrs). See Note 1 . Note 1 . For additional information, please refer to  http://education.[facility].com/faq/FAQ199  (This link is being provided for informational/ educational purposes only.)   Urinalysis,Complete w/RFL Culture     Status: Abnormal     Collection Time: 03/21/20  2:08 PM  Result Value Ref Range   Color, Urine YELLOW YELLOW   APPearance CLEAR CLEAR   Specific Gravity, Urine 1.004 1.001 - 1.03    Comment: Verified by repeat analysis. .    pH 6.5 5.0 - 8.0   Glucose, UA NEGATIVE NEGATIVE   Bilirubin Urine NEGATIVE NEGATIVE   Ketones, ur NEGATIVE NEGATIVE   Hgb urine dipstick NEGATIVE NEGATIVE   Protein, ur NEGATIVE NEGATIVE   Nitrites, Initial NEGATIVE NEGATIVE   Leukocyte Esterase TRACE (A) NEGATIVE   WBC, UA 0-5 0 - 5 /HPF   RBC / HPF NONE SEEN 0 - 2 /HPF   Squamous Epithelial / LPF 0-5 < OR = 5 /HPF   Bacteria, UA NONE SEEN NONE SEEN /HPF   Hyaline Cast NONE SEEN NONE SEEN /LPF   Urine-Other      Comment: URINE CULTURE PENDING   Note      Comment: This urine was analyzed for the presence of WBC,  RBC, bacteria, casts, and other formed elements.  Only those elements seen were reported. . .   Urine Culture     Status: None   Collection Time: 03/21/20  2:08 PM  Result Value Ref Range   MICRO NUMBER: [id number]    SPECIMEN QUALITY: Adequate    Sample Source URINE    STATUS: FINAL    Result: No Growth   REFLEXIVE URINE CULTURE     Status: None   Collection Time: 03/21/20  2:08 PM  Result Value Ref Range   REFLEXIVE URINE CULTURE      Comment: CULTURE INDICATED - RESULTS TO FOLLOW  Pap IG w/ reflex to HPV when ASC-U     Status: None   Collection Time: 03/21/20  3:52 PM  Result Value Ref Range   Clinical Information:      Comment: BRUSH/SPATULA   LMP:      Comment: POST   PREV. PAP:      Comment: NONE GIVEN   PREV. BX:      Comment: NONE GIVEN   HPV DNA Probe-Source      Comment: Endocervix   STATEMENT OF ADEQUACY:      Comment: SATISFACTORY FOR EVALUATION   INTERPRETATION/RESULT:      Comment: Negative for intraepithelial lesion or malignancy. Atrophic pattern; predominantly parabasal cells    Comment:  Comment: This Pap test has been evaluated with computer assisted  technology. Parabasal cells in smears that lack maturation due to atrophy or other hormonal reasons cannot be differentiated from transformation zone cells. Accordingly, presence or absence of endocervical or transformation zone components cannot be reported in this patient.    CYTOTECHNOLOGIST:      Comment: JWW, CT(ASCP) CT screening location: [address], [address], [place], [place] [address] EXPLANATORY NOTE:  . The Pap is a screening test for cervical cancer. It is  not a diagnostic test and is subject to false negative  and false positive results. It is most reliable when a  satisfactory sample, regularly obtained, is submitted  with relevant clinical findings and history, and when  the Pap result is evaluated along with historic and  current clinical information. Marland Kitchen   POCT Urinalysis Dipstick     Status: Abnormal   Collection Time: 04/18/20 10:57 AM  Result Value Ref Range   Color, UA yellow    Clarity, UA clear    Glucose, UA Negative Negative   Bilirubin, UA Negative    Ketones, UA Negative    Spec Grav, UA 1.010 1.010 - 1.025   Blood, UA Negative    pH, UA 7.0 5.0 - 8.0   Protein, UA Negative Negative   Urobilinogen, UA 0.2 0.2 or 1.0 E.U./dL   Nitrite, UA Negative    Leukocytes, UA Trace (A) Negative   Appearance     Odor    Sedimentation rate     Status: None   Collection Time: 04/18/20 11:28 AM  Result Value Ref Range   Sed Rate 10 0 - 30 mm/hr    Assessment/Plan: 1. Urinary frequency Ongoing.  Is already had work-up with gynecology revealing pelvic organ prolapse as cause of symptoms.  Repeat UA today is unremarkable except for trace leukocytes.  No true signs of infection present.  Will obtain repeat urine culture patient request.  No indication for antibiotic unless culture shows otherwise.  Recommend she follow-up with gynecology to get her pessary replaced.  She voiced understanding and agreement with plan. - POCT Urinalysis Dipstick - Urine  Culture  2. Chronic nonintractable headache, unspecified headache type Noted the very end of visit.  Previously followed by neurology but has not seen in some time.  Was initially thought to potentially have multiple sclerosis.  She would like to assess this further.  Completely asymptomatic today.  Headaches are temporal and present.  Will check sed rate today just to make sure no concern for arteritis. If negative, as expected, we will just have her follow-up with neurology as anticipated. - Sedimentation rate  This visit occurred during the SARS-CoV-2 public health emergency.  Safety protocols were in place, including screening questions prior to the visit, additional usage of staff PPE, and extensive cleaning of exam room while observing appropriate contact time as indicated for disinfecting solutions.     Leeanne Rio, PA-C

## 2020-04-18 NOTE — Patient Instructions (Signed)
Please go to the lab today for blood work.  I will call you with your results. We will alter treatment regimen(s) if indicated by your results.   I am not starting antibiotic unless your urine culture comes back positive. I agree with your specialist that these symptoms are related to cystocele and prolapse. The pessary will likely give you relief of these symptoms. Please call them to get this set up.   I am referring you back to Neurology as discussed.  You will receive a phone call from them.

## 2020-04-19 ENCOUNTER — Other Ambulatory Visit: Payer: Self-pay | Admitting: Emergency Medicine

## 2020-04-19 DIAGNOSIS — R519 Headache, unspecified: Secondary | ICD-10-CM

## 2020-04-19 LAB — URINE CULTURE
MICRO NUMBER:: 10899495
SPECIMEN QUALITY:: ADEQUATE

## 2020-04-24 ENCOUNTER — Ambulatory Visit (INDEPENDENT_AMBULATORY_CARE_PROVIDER_SITE_OTHER): Payer: Medicare HMO

## 2020-04-24 ENCOUNTER — Ambulatory Visit: Payer: Medicare HMO | Admitting: Obstetrics and Gynecology

## 2020-04-24 ENCOUNTER — Other Ambulatory Visit: Payer: Self-pay

## 2020-04-24 ENCOUNTER — Other Ambulatory Visit: Payer: Self-pay | Admitting: Obstetrics and Gynecology

## 2020-04-24 ENCOUNTER — Encounter: Payer: Self-pay | Admitting: Obstetrics and Gynecology

## 2020-04-24 VITALS — BP 118/80

## 2020-04-24 DIAGNOSIS — M8589 Other specified disorders of bone density and structure, multiple sites: Secondary | ICD-10-CM

## 2020-04-24 DIAGNOSIS — Z780 Asymptomatic menopausal state: Secondary | ICD-10-CM | POA: Diagnosis not present

## 2020-04-24 DIAGNOSIS — Z4689 Encounter for fitting and adjustment of other specified devices: Secondary | ICD-10-CM

## 2020-04-24 DIAGNOSIS — N819 Female genital prolapse, unspecified: Secondary | ICD-10-CM

## 2020-04-24 DIAGNOSIS — E559 Vitamin D deficiency, unspecified: Secondary | ICD-10-CM

## 2020-04-24 DIAGNOSIS — N8111 Cystocele, midline: Secondary | ICD-10-CM

## 2020-04-24 DIAGNOSIS — Z01419 Encounter for gynecological examination (general) (routine) without abnormal findings: Secondary | ICD-10-CM

## 2020-04-24 DIAGNOSIS — M810 Age-related osteoporosis without current pathological fracture: Secondary | ICD-10-CM

## 2020-04-24 NOTE — Progress Notes (Signed)
   MRY LAMIA 1951/03/08 [id number]  SUBJECTIVE:  69 y.o. [id number] female presents for a pessary fitting.  She was fitted for a size 4 ring pessary with support at her last visit 03/21/2020.  She has been dealing with urinary and bowel symptoms presumably at least partially due to her pelvic organ prolapse.  Allergies: Iodine-131, Sulfamethoxazole, Amitriptyline, Effexor [venlafaxine], Iohexol, Lactose intolerance (gi), Lexapro [escitalopram oxalate], Iodinated diagnostic agents, and Prednisone  No LMP recorded. Patient is postmenopausal.  Past medical history,surgical history, problem list, medications, allergies, family history and social history were all reviewed and documented as reviewed in the EPIC chart.  ROS: Negative other than as reviewed in the HPI.   OBJECTIVE:  BP 118/80 (BP Location: Right Arm, Patient Position: Sitting, Cuff Size: Normal)  PELVIC EXAM: VULVA:normal appearing vulva with no masses, tenderness or lesions,atrophic changes,VAGINA:normal appearing vagina with normal color and discharge, no lesions,grade 2 cystocele with Valsalva, grade 1 rectocele (examined in dorsal lithotomy),CERVIX: normalatrophicappearing cervix without discharge or lesions,mild prolapse of uterus and cervix to grade 1-2 Size 4 ring pessary with support is inserted without difficulty.  The patient feels well after insertion without any pain or pressure.  Chaperone: KimAlexis Bonham present during the examination  ASSESSMENT:  69 y.o. [id number] here for pessary placement  PLAN:  We discussed the need for pessary maintenance and she will plan to return in 3 to 4 months.  If her urinary symptoms are not improving then I would recommend urology evaluation.   Joseph Pierini MD 04/24/20

## 2020-04-26 ENCOUNTER — Other Ambulatory Visit: Payer: Self-pay | Admitting: Physician Assistant

## 2020-04-26 DIAGNOSIS — F341 Dysthymic disorder: Secondary | ICD-10-CM

## 2020-04-26 NOTE — Telephone Encounter (Signed)
Xanax last rx 03/23/20 #30 LOV: 04/18/20 Urinary frequency CSC: 01/14/18

## 2020-04-30 ENCOUNTER — Encounter: Payer: Self-pay | Admitting: Physician Assistant

## 2020-05-03 ENCOUNTER — Other Ambulatory Visit: Payer: Self-pay | Admitting: Physician Assistant

## 2020-05-03 DIAGNOSIS — F339 Major depressive disorder, recurrent, unspecified: Secondary | ICD-10-CM

## 2020-05-15 ENCOUNTER — Encounter: Payer: Self-pay | Admitting: Physician Assistant

## 2020-05-15 MED ORDER — BELSOMRA 10 MG PO TABS: 10.0000 mg | ORAL_TABLET | Freq: Every day | ORAL | 0 refills | Status: DC

## 2020-05-15 NOTE — Addendum Note (Signed)
Addended by: Brunetta Jeans on: 05/15/2020 06:39 PM   Modules accepted: Orders

## 2020-05-22 ENCOUNTER — Telehealth: Payer: Self-pay | Admitting: Physician Assistant

## 2020-05-22 NOTE — Progress Notes (Signed)
  Chronic Care Management   Outreach Note  05/22/2020 Name: Megan [name] MRN: [id number] DOB: [birth year]  Referred by: Brunetta Jeans, PA-C Reason for referral : No chief complaint on file.   A second unsuccessful telephone outreach was attempted today. The patient was referred to pharmacist for assistance with care management and care coordination.  Follow Up Plan:   Lauretta Grill Upstream Scheduler

## 2020-05-23 ENCOUNTER — Encounter: Payer: Self-pay | Admitting: Physician Assistant

## 2020-05-23 NOTE — Telephone Encounter (Signed)
Needs appt to discuss and make changes.

## 2020-05-25 ENCOUNTER — Telehealth (INDEPENDENT_AMBULATORY_CARE_PROVIDER_SITE_OTHER): Payer: Medicare HMO | Admitting: Physician Assistant

## 2020-05-25 ENCOUNTER — Encounter: Payer: Self-pay | Admitting: Physician Assistant

## 2020-05-25 VITALS — BP 127/70 | HR 62 | Temp 97.2°F

## 2020-05-25 DIAGNOSIS — F341 Dysthymic disorder: Secondary | ICD-10-CM | POA: Diagnosis not present

## 2020-05-25 MED ORDER — BUPROPION HCL ER (XL) 150 MG PO TB24: 150.0000 mg | ORAL_TABLET | Freq: Every day | ORAL | 1 refills | Status: DC

## 2020-05-25 NOTE — Progress Notes (Signed)
Virtual Visit via Video   I connected with patient on 05/25/20 at  1:30 PM EDT by a video enabled telemedicine application and verified that I am speaking with the correct person using two identifiers.  Location patient: Home Location provider: [facility] Office Persons participating in the virtual visit: Patient, Provider, [place] (Patina Moore)  I discussed the limitations of evaluation and management by telemedicine and the availability of in person appointments. The patient expressed understanding and agreed to proceed.  Subjective:   HPI:   Patient presents via [place] today to discuss ongoing issue with anxiety/depression, now noting generalized anxiety in addition to depressed mood and anhedonia. Patient with multiple drug allergies (Effexor -- palpitations; Amitriptyline -- worsening mood) and sensitivities (Lexapro -- nausea; Sertraline -- diarrhea). This has made her hesitant of trying other medications. Most recently on fluoxetine which was subtherapeutic. Denies SI/HI. Denies panic attack but does stipp have episodes of acute anxiety on top of her generalized level of stress/anxiety. Is taking Belsomra 10 mg night. Helps her fall asleep but only sleeping a few hours before she is awake and then cannot go back to slepp.   ROS:   See pertinent positives and negatives per HPI.  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   Tricuspid regurgitation 07/21/2011   Mitral regurgitation 07/21/2011   Gastroesophageal reflux disease with hiatal hernia 06/17/2011   Endometrial hyperplasia    Diverticulosis    IBS (irritable bowel syndrome) 04/24/2011   Barrett's esophagus 03/04/2011   Hiatal hernia 03/04/2011   BASAL CELL CARCINOMA, FACE 11/15/2009   Migraine 11/15/2009   Hyperglycemia 06/05/2009   Vitamin B12 deficiency 02/15/2009   BLIND LOOP SYNDROME 02/13/2009   Essential hypertension 01/04/2009   ANXIETY DEPRESSION 12/14/2007    Social History   Tobacco Use   Smoking status: Former Smoker    Quit date: 08/19/1995    Years since quitting: 24.7   Smokeless tobacco: Never Used  Substance Use Topics   Alcohol use: No    Alcohol/week: 0.0 standard drinks    Current Outpatient Medications:    albuterol (VENTOLIN HFA) 108 (90 Base) MCG/ACT inhaler, Inhale 2 puffs into the lungs every 6 (six) hours as needed for wheezing or shortness of breath., Disp: 8 g, Rfl: 0   ALPRAZolam (XANAX) 1 MG tablet, TAKE 1 TABLET BY MOUTH TWICE A DAY AS NEEDED NEEDS APPT FOR REFILLS, Disp: 30 tablet, Rfl: 0   aspirin EC 81 MG tablet, Take 1 tablet (81 mg total) by mouth daily., Disp: , Rfl:    baclofen (LIORESAL) 10 MG tablet, Take 0.5-1 tablets (5-10 mg total) by mouth 3 (three) times daily as needed for muscle spasms., Disp: 30 each, Rfl: 3   Biotin 1000 MCG tablet, Take 1,000 mcg by mouth 3 (three) times daily., Disp: , Rfl:    Cholecalciferol (VITAMIN D-3) 5000 units TABS, Take 1 tablet by mouth daily., Disp: 30 tablet, Rfl: 0  Cyanocobalamin (VITAMIN B-12 PO), Take 1 tablet by mouth daily., Disp: , Rfl:    diltiazem (CARTIA XT) 180 MG 24 hr capsule, Take 1 capsule (180 mg total) by mouth daily., Disp: 90 capsule, Rfl: 3   fluconazole (DIFLUCAN) 150 MG tablet, Take 1 tablet (150 mg total) by mouth daily., Disp: 2 tablet, Rfl: 0   FLUoxetine  (PROZAC) 10 MG capsule, TAKE 1 CAPSULE BY MOUTH EVERY DAY, Disp: 90 capsule, Rfl: 1   hyoscyamine (LEVBID) 0.375 MG 12 hr tablet, Take 1 tablet by mouth as needed., Disp: , Rfl:    linaclotide (LINZESS) 72 MCG capsule, Take 72 mcg by mouth daily as needed. constipation, Disp: , Rfl:    metoprolol succinate (TOPROL XL) 25 MG 24 hr tablet, Take 1 tablet (25 mg total) by mouth daily., Disp: 90 tablet, Rfl: 1   Multiple Minerals (CALCIUM/MAGNESIUM/ZINC PO), Take 1 tablet by mouth daily., Disp: , Rfl:    ondansetron (ZOFRAN) 4 MG tablet, TAKE 1 TABLET BY MOUTH EVERY 8 HOURS AS NEEDED FOR NAUSEA / VOMITING, Disp: , Rfl:    sucralfate (CARAFATE) 1 g tablet, Take 1 g by mouth 4 (four) times daily -  with meals and at bedtime., Disp: , Rfl:    Suvorexant (BELSOMRA) 10 MG TABS, Take 10 mg by mouth at bedtime., Disp: 30 tablet, Rfl: 0   vitamin E 400 UNIT capsule, Take 400 Units by mouth daily., Disp: , Rfl:   Allergies  Allergen Reactions   Iodine-131 Other (See Comments)    Other reaction(s): Dizziness (intolerance)   Sulfamethoxazole Shortness Of Breath    chest tightness   Amitriptyline Anxiety and Other (See Comments)    Depression, insomnia   Effexor [Venlafaxine]     palpitations   Iohexol Other (See Comments)    Passed out   Lactose Intolerance (Gi) Diarrhea and Nausea And Vomiting   Lexapro [Escitalopram Oxalate]     nausea   Iodinated Diagnostic Agents     Pt states at Dr.Grapeys office 15 years ago blacked out for 2 hours during injection for IVP. Pt says she had dye recently with no problems    Prednisone Palpitations    Objective:   There were no vitals taken for this visit.  Patient is well-developed, well-nourished in no acute distress.  Resting comfortably at home.  Head is normocephalic, atraumatic.  No labored breathing.  Speech is clear and coherent with logical content.  Patient is alert and oriented at baseline.   Assessment and Plan:   1.  ANXIETY/ DEPRESSION After lengthy discussion we have decided on trial of Wellbutrin XL 150 mg once daily. Will continue Xanax PRN. Will increase Belsomra to 15 mg nightly for now. She is to consider counseling. Follow-up next week via mychart regarding sleep. Will plan on follow-up regarding mood in 4 weeks (in-office or video).     Leeanne Rio, PA-C 05/25/2020

## 2020-05-25 NOTE — Progress Notes (Signed)
I have discussed the procedure for the virtual visit with the patient who has given consent to proceed with assessment and treatment.   Jeziah Kretschmer L Ginny Loomer, CMA     

## 2020-05-28 ENCOUNTER — Other Ambulatory Visit: Payer: Self-pay | Admitting: Physician Assistant

## 2020-05-28 DIAGNOSIS — F341 Dysthymic disorder: Secondary | ICD-10-CM

## 2020-05-28 NOTE — Telephone Encounter (Signed)
Xanax last rx 04/26/20 #30 LOV: 05/25/20 Anxiety and Depression CSC: 01/14/18

## 2020-05-29 ENCOUNTER — Encounter: Payer: Self-pay | Admitting: Physician Assistant

## 2020-06-01 ENCOUNTER — Encounter: Payer: Self-pay | Admitting: Physician Assistant

## 2020-06-11 ENCOUNTER — Encounter: Payer: Self-pay | Admitting: Physician Assistant

## 2020-06-12 NOTE — Telephone Encounter (Signed)
She needs to contact her GI provider first to see about getting worked in for an appointment with them giving her multiple ongoing GI issues. If unable, then she needs to schedule appointment with me.

## 2020-06-18 DIAGNOSIS — N819 Female genital prolapse, unspecified: Secondary | ICD-10-CM | POA: Diagnosis not present

## 2020-06-18 DIAGNOSIS — N8111 Cystocele, midline: Secondary | ICD-10-CM | POA: Diagnosis not present

## 2020-06-18 DIAGNOSIS — R350 Frequency of micturition: Secondary | ICD-10-CM | POA: Diagnosis not present

## 2020-06-19 ENCOUNTER — Other Ambulatory Visit: Payer: Self-pay | Admitting: Physician Assistant

## 2020-06-19 DIAGNOSIS — Z1231 Encounter for screening mammogram for malignant neoplasm of breast: Secondary | ICD-10-CM

## 2020-06-20 ENCOUNTER — Ambulatory Visit (INDEPENDENT_AMBULATORY_CARE_PROVIDER_SITE_OTHER): Payer: Medicare HMO

## 2020-06-20 ENCOUNTER — Other Ambulatory Visit: Payer: Self-pay

## 2020-06-20 ENCOUNTER — Encounter: Payer: Self-pay | Admitting: Physician Assistant

## 2020-06-20 DIAGNOSIS — Z23 Encounter for immunization: Secondary | ICD-10-CM

## 2020-06-21 ENCOUNTER — Other Ambulatory Visit: Payer: Self-pay | Admitting: Physician Assistant

## 2020-06-21 DIAGNOSIS — R350 Frequency of micturition: Secondary | ICD-10-CM

## 2020-06-21 NOTE — Progress Notes (Signed)
refer

## 2020-06-26 ENCOUNTER — Ambulatory Visit (INDEPENDENT_AMBULATORY_CARE_PROVIDER_SITE_OTHER): Payer: Medicare HMO | Admitting: Physician Assistant

## 2020-06-26 ENCOUNTER — Other Ambulatory Visit: Payer: Self-pay

## 2020-06-26 ENCOUNTER — Encounter: Payer: Self-pay | Admitting: Physician Assistant

## 2020-06-26 VITALS — BP 124/70 | HR 68 | Temp 98.4°F | Resp 14 | Ht 66.0 in | Wt 140.0 lb

## 2020-06-26 DIAGNOSIS — R5382 Chronic fatigue, unspecified: Secondary | ICD-10-CM

## 2020-06-26 DIAGNOSIS — L659 Nonscarring hair loss, unspecified: Secondary | ICD-10-CM | POA: Diagnosis not present

## 2020-06-26 DIAGNOSIS — M542 Cervicalgia: Secondary | ICD-10-CM

## 2020-06-26 DIAGNOSIS — M25512 Pain in left shoulder: Secondary | ICD-10-CM

## 2020-06-26 LAB — CBC WITH DIFFERENTIAL/PLATELET
Basophils Absolute: 0 10*3/uL (ref 0.0–0.1)
Basophils Relative: 0.7 % (ref 0.0–3.0)
Eosinophils Absolute: 0.1 10*3/uL (ref 0.0–0.7)
Eosinophils Relative: 1.7 % (ref 0.0–5.0)
HCT: 39.4 % (ref 36.0–46.0)
Hemoglobin: 13.4 g/dL (ref 12.0–15.0)
Lymphocytes Relative: 40.3 % (ref 12.0–46.0)
Lymphs Abs: 2.3 10*3/uL (ref 0.7–4.0)
MCHC: 34 g/dL (ref 30.0–36.0)
MCV: 93.6 fl (ref 78.0–100.0)
Monocytes Absolute: 0.5 10*3/uL (ref 0.1–1.0)
Monocytes Relative: 9.4 % (ref 3.0–12.0)
Neutro Abs: 2.8 10*3/uL (ref 1.4–7.7)
Neutrophils Relative %: 47.9 % (ref 43.0–77.0)
Platelets: 289 10*3/uL (ref 150.0–400.0)
RBC: 4.21 Mil/uL (ref 3.87–5.11)
RDW: 12.8 % (ref 11.5–15.5)
WBC: 5.8 10*3/uL (ref 4.0–10.5)

## 2020-06-26 LAB — VITAMIN B12: Vitamin B-12: 536 pg/mL (ref 211–911)

## 2020-06-26 LAB — TSH: TSH: 0.79 u[IU]/mL (ref 0.35–4.50)

## 2020-06-26 MED ORDER — TIZANIDINE HCL 4 MG PO TABS: 4.0000 mg | ORAL_TABLET | Freq: Every day | ORAL | 0 refills | Status: DC

## 2020-06-26 NOTE — Progress Notes (Signed)
Patient presents to clinic today to discuss multiple issues.   Patient endorses intermittent tightness and sensation of swelling on the right side.  Collarbone.  Is associated with left shoulder pain but started a few weeks ago after episode of heavy lifting.  Denies decreased range of motion of neck.  Denies decreased range of motion of left shoulder but cannot movements reproduce pain.  Denies fever.  Has not taken anything for symptoms.  Also endorses 1 week of fatigue associated with diffuse hair loss.  Notes when showering her hair comes out in the handfuls.  Denies any scalp pain, rash or itching.  Denies any noted thinning of eyebrows or body hair.  Her substantial history of anxiety and depression, recently stopping her medications.  Denies any abrupt change in mood.  Does note significant stress levels.  Past Medical History:  Diagnosis Date   Adrenal adenoma 05/22/2014   Adrenal gland cyst ([place]) 05/22/2014   Allergic state 12/31/2012   Anxiety    Asthma    Barrett esophagus    Blind loop syndrome    C. difficile diarrhea    Cancer ([facility])    Chest pain, atypical 07/14/2011   Cold sore 07/17/2016   Colon polyp    Contact dermatitis 03/23/2012   Cystocele, midline    Depression    Diarrhea    Diverticulosis    Endometrial hyperplasia 02/27/2006   BENIGN ENDO BX ON 02/2007   GERD (gastroesophageal reflux disease)    Headache 04/16/2015   Headache    Headache(784.0) 04/15/2012   Hematuria 04/27/2012   Hepatic cyst    Hiatal hernia    Hiatal hernia    Hyperlipidemia, mixed 12/17/2014   Hypertension    IBS (irritable bowel syndrome)    Insomnia    Leaky heart valve    Low back pain 08/15/2013   Osteoporosis 03/2017   T score -2.5. 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   Paronychia of great toe, left 06/19/2013   Preventative health care 09/11/2014   Rectal bleeding 10/10/2011   Rectocele    Sacroiliac  joint disease 04/27/2012   Sinusitis acute 02/03/2012   Thrush 07/21/2011   Tricuspid regurgitation 07/21/2011   Unspecified hypothyroidism    Unspecified menopausal and postmenopausal disorder    Uterine prolapse without mention of vaginal wall prolapse    Vitamin B12 deficiency    Vitamin D deficiency 07/17/2016    Current Outpatient Medications on File Prior to Visit  Medication Sig Dispense Refill   albuterol (VENTOLIN HFA) 108 (90 Base) MCG/ACT inhaler Inhale 2 puffs into the lungs every 6 (six) hours as needed for wheezing or shortness of breath. 8 g 0   ALPRAZolam (XANAX) 1 MG tablet TAKE 1 TABLET BY MOUTH TWICE A DAY AS NEEDED NEEDS APPT FOR REFILLS 30 tablet 0   aspirin EC 81 MG tablet Take 1 tablet (81 mg total) by mouth daily.     baclofen (LIORESAL) 10 MG tablet Take 0.5-1 tablets (5-10 mg total) by mouth 3 (three) times daily as needed for muscle spasms. 30 each 3   Biotin 1000 MCG tablet Take 1,000 mcg by mouth 3 (three) times daily.     Cholecalciferol (VITAMIN D-3) 5000 units TABS Take 1 tablet by mouth daily. 30 tablet 0   Cyanocobalamin (VITAMIN B-12 PO) Take 1 tablet by mouth daily.     diltiazem (CARTIA XT) 180 MG 24 hr capsule Take 1 capsule (180 mg total) by mouth daily. Sundown  capsule 3   linaclotide (LINZESS) 72 MCG capsule Take 72 mcg by mouth daily as needed. constipation     metoprolol succinate (TOPROL XL) 25 MG 24 hr tablet Take 1 tablet (25 mg total) by mouth daily. 90 tablet 1   Multiple Minerals (CALCIUM/MAGNESIUM/ZINC PO) Take 1 tablet by mouth daily.     omeprazole (PRILOSEC) 40 MG capsule      ondansetron (ZOFRAN) 4 MG tablet TAKE 1 TABLET BY MOUTH EVERY 8 HOURS AS NEEDED FOR NAUSEA / VOMITING     promethazine (PHENERGAN) 25 MG tablet TAKE 1 TABLET BY MOUTH EVERY 6 HOURS AS NEEDED NAUSEA     sucralfate (CARAFATE) 1 g tablet Take 1 g by mouth 4 (four) times daily -  with meals and at bedtime.     vitamin E 400 UNIT capsule Take 400 Units  by mouth daily.     No current facility-administered medications on file prior to visit.    Allergies  Allergen Reactions   Iodine-131 Other (See Comments)    Other reaction(s): Dizziness (intolerance)   Sulfamethoxazole Shortness Of Breath    chest tightness   Amitriptyline Anxiety and Other (See Comments)    Depression, insomnia   Effexor [Venlafaxine]     palpitations   Iohexol Other (See Comments)    Passed out   Lactose Intolerance (Gi) Diarrhea and Nausea And Vomiting   Lexapro [Escitalopram Oxalate]     nausea   Iodinated Diagnostic Agents     Pt states at Dr.Grapeys office 15 years ago blacked out for 2 hours during injection for IVP. Pt says she had dye recently with no problems    Prednisone Palpitations    Family History  Problem Relation Age of Onset   Colon cancer Mother    Diabetes Mother    Hypertension Mother    Colon polyps Father    Parkinson's disease Father    Colon cancer Maternal Grandmother    Breast cancer Maternal Grandmother 69   Diabetes Paternal Grandmother    Breast cancer Paternal Grandmother 3   Allergies Sister    Arthritis Brother        b/l hip replace   Alcohol abuse Daughter    Arthritis Son    Hypertension Son    Allergies Son    Osteoporosis Son    Osteoporosis Son    Arthritis Son        back disease, DDD   Allergies Son    Arthritis Son    Irritable bowel syndrome Son        RSD   Hypertension Paternal Grandfather    Heart disease Paternal Grandfather    Aneurysm Paternal Grandfather    Colon cancer Maternal Aunt    Colon cancer Cousin        X3   Ovarian cancer Cousin     Social History   Socioeconomic History   Marital status: Married    Spouse name: Not on file   Number of children: 4   Years of education: 12   Highest education level: High school graduate  Occupational History   Occupation: Retired  Tobacco Use   Smoking status: Former Smoker    Quit date:  08/19/1995    Years since quitting: 24.8   Smokeless tobacco: Never Used  Vaping Use   Vaping Use: Never used  Substance and Sexual Activity   Alcohol use: No    Alcohol/week: 0.0 standard drinks   Drug use: No    Types: Hydrocodone  Sexual activity: Not Currently    Birth control/protection: Post-menopausal    Comment: 1st intercourse 51 yo-5 partners  Other Topics Concern   Not on file  Social History Narrative   Lives at home with her husband.   Right-handed.   2 cups caffeine per day.   Social Determinants of Health   Financial Resource Strain:    Difficulty of Paying Living Expenses: Not on file  Food Insecurity:    Worried About Charity fundraiser in the Last Year: Not on file   [employer] of Food in the Last Year: Not on file  Transportation Needs:    Lack of Transportation (Medical): Not on file   Lack of Transportation (Non-Medical): Not on file  Physical Activity:    Days of Exercise per Week: Not on file   Minutes of Exercise per Session: Not on file  Stress:    Feeling of Stress : Not on file  Social Connections:    Frequency of Communication with Friends and Family: Not on file   Frequency of Social Gatherings with Friends and Family: Not on file   Attends Religious Services: Not on file   Active Member of Clubs or Organizations: Not on file   Attends Archivist Meetings: Not on file   Marital Status: Not on file    Review of Systems - See HPI.  All other ROS are negative.  BP 124/70    Pulse 68    Temp 98.4 F (36.9 C) (Temporal)    Resp 14    Ht 5\' 6"  (1.676 m)    Wt 140 lb (63.5 kg)    SpO2 99%    BMI 22.60 kg/m   Physical Exam Vitals reviewed.  Constitutional:      Appearance: Normal appearance.  HENT:     Head: Normocephalic and atraumatic.     Right Ear: Tympanic membrane normal.     Left Ear: Tympanic membrane normal.     Mouth/Throat:     Mouth: Mucous membranes are moist.  Eyes:     Conjunctiva/sclera:  Conjunctivae normal.     Pupils: Pupils are equal, round, and reactive to light.  Cardiovascular:     Rate and Rhythm: Normal rate and regular rhythm.     Pulses: Normal pulses.     Heart sounds: Normal heart sounds.  Pulmonary:     Effort: Pulmonary effort is normal.     Breath sounds: Normal breath sounds.  Abdominal:     General: Bowel sounds are normal.     Palpations: Abdomen is soft.  Musculoskeletal:     Cervical back: Neck supple.  Skin:    Findings: No rash.     Comments: Hair of scalp examined.  No evidence of bacterial or fungal infection.  Some thinning of hair noted in temporal areas bilaterally.  No other substantial hair loss noted.  Neurological:     General: No focal deficit present.     Mental Status: She is alert and oriented to person, place, and time.     Recent Results (from the past 2160 hour(s))  POCT Urinalysis Dipstick     Status: Abnormal   Collection Time: 04/18/20 10:57 AM  Result Value Ref Range   Color, UA yellow    Clarity, UA clear    Glucose, UA Negative Negative   Bilirubin, UA Negative    Ketones, UA Negative    Spec Grav, UA 1.010 1.010 - 1.025   Blood, UA Negative  pH, UA 7.0 5.0 - 8.0   Protein, UA Negative Negative   Urobilinogen, UA 0.2 0.2 or 1.0 E.U./dL   Nitrite, UA Negative    Leukocytes, UA Trace (A) Negative   Appearance     Odor    Urine Culture     Status: None   Collection Time: 04/18/20 10:59 AM   Specimen: Urine  Result Value Ref Range   MICRO NUMBER: [id number]    SPECIMEN QUALITY: Adequate    Sample Source URINE    STATUS: FINAL    ISOLATE 1:      Less than 10,000 CFU/mL of single Gram positive organism isolated. No further testing will be performed. If clinically indicated, recollection using a method to minimize contamination, with prompt transfer to Urine Culture Transport Tube, is recommended.  Sedimentation rate     Status: None   Collection Time: 04/18/20 11:28 AM  Result Value Ref Range   Sed Rate 10 0  - 30 mm/hr    Assessment/Plan: 1. Hair loss Recent onset.  Given location of mild hair loss in temporal region associated with increased stress, question to telogen effluvium.  Will check thyroid studies, blood count and iron panel today.  Discussed stress relief topical.  Patient declines restarting medication for her anxiety and depression. - TSH - CBC w/Diff - Iron, TIBC and Ferritin Panel  2. Chronic fatigue Patient endorses 1 week history but this has been an intermittent and ongoing issue for her with multiple negative work-ups.  Suspect this is related to depression and anxiety but she declines treatment or run low will check B12 in addition to CBC and thyroid. - B12  3. Neck pain on left side 4. Acute pain of left shoulder Mild trapezius strain and tension.  Supportive measures and OTC medications reviewed with patient.  Rx trial of tizanidine 4 mg.  Follow-up scheduled.  This visit occurred during the SARS-CoV-2 public health emergency.  Safety protocols were in place, including screening questions prior to the visit, additional usage of staff PPE, and extensive cleaning of exam room while observing appropriate contact time as indicated for disinfecting solutions.     Leeanne Rio, PA-C

## 2020-06-26 NOTE — Patient Instructions (Signed)
Please go to the lab today for blood work.  I will call you with your results. We will alter treatment regimen(s) if indicated by your results.   Please avoid heavy lifting and overexertion.  Tylenol for pain. Apply topical Voltaren gel to the neck and shoulder. Use the Tizanidine in the evening instead of Baclofen.  Let me know if shoulder symptoms are not improving.

## 2020-06-27 LAB — IRON,TIBC AND FERRITIN PANEL
%SAT: 24 % (calc) (ref 16–45)
Ferritin: 28 ng/mL (ref 16–288)
Iron: 91 ug/dL (ref 45–160)
TIBC: 373 mcg/dL (calc) (ref 250–450)

## 2020-06-29 ENCOUNTER — Other Ambulatory Visit: Payer: Self-pay | Admitting: Physician Assistant

## 2020-06-29 DIAGNOSIS — F341 Dysthymic disorder: Secondary | ICD-10-CM

## 2020-06-29 NOTE — Telephone Encounter (Signed)
Xanax last rx 05/30/20 #30 LOV: 05/25/20 Anxiety/Depression CSC: 01/14/18

## 2020-07-03 ENCOUNTER — Telehealth: Payer: Self-pay | Admitting: Physician Assistant

## 2020-07-03 DIAGNOSIS — N819 Female genital prolapse, unspecified: Secondary | ICD-10-CM | POA: Diagnosis not present

## 2020-07-03 NOTE — Telephone Encounter (Signed)
Left message for patient to schedule Annual Wellness Visit.  Please schedule with Nurse Health Advisor Martha Stanley, RN at [facility]  

## 2020-07-04 ENCOUNTER — Institutional Professional Consult (permissible substitution): Payer: Medicare HMO | Admitting: Neurology

## 2020-07-04 DIAGNOSIS — Z20822 Contact with and (suspected) exposure to covid-19: Secondary | ICD-10-CM | POA: Diagnosis not present

## 2020-07-04 DIAGNOSIS — Z01818 Encounter for other preprocedural examination: Secondary | ICD-10-CM | POA: Diagnosis not present

## 2020-07-10 DIAGNOSIS — K219 Gastro-esophageal reflux disease without esophagitis: Secondary | ICD-10-CM | POA: Diagnosis not present

## 2020-07-10 DIAGNOSIS — J449 Chronic obstructive pulmonary disease, unspecified: Secondary | ICD-10-CM | POA: Diagnosis not present

## 2020-07-10 DIAGNOSIS — Z85060 Personal history of malignant carcinoid tumor of small intestine: Secondary | ICD-10-CM | POA: Diagnosis not present

## 2020-07-10 DIAGNOSIS — R1032 Left lower quadrant pain: Secondary | ICD-10-CM | POA: Diagnosis not present

## 2020-07-10 DIAGNOSIS — K5730 Diverticulosis of large intestine without perforation or abscess without bleeding: Secondary | ICD-10-CM | POA: Diagnosis not present

## 2020-07-10 DIAGNOSIS — Z1211 Encounter for screening for malignant neoplasm of colon: Secondary | ICD-10-CM | POA: Diagnosis not present

## 2020-07-10 DIAGNOSIS — K449 Diaphragmatic hernia without obstruction or gangrene: Secondary | ICD-10-CM | POA: Diagnosis not present

## 2020-07-10 DIAGNOSIS — Z800 Family history of malignant neoplasm of digestive organs: Secondary | ICD-10-CM | POA: Diagnosis not present

## 2020-07-10 DIAGNOSIS — R194 Change in bowel habit: Secondary | ICD-10-CM | POA: Diagnosis not present

## 2020-07-10 DIAGNOSIS — G4730 Sleep apnea, unspecified: Secondary | ICD-10-CM | POA: Diagnosis not present

## 2020-07-10 DIAGNOSIS — Z8509 Personal history of malignant neoplasm of other digestive organs: Secondary | ICD-10-CM | POA: Diagnosis not present

## 2020-07-16 ENCOUNTER — Other Ambulatory Visit: Payer: Self-pay | Admitting: Physician Assistant

## 2020-07-16 DIAGNOSIS — M545 Low back pain, unspecified: Secondary | ICD-10-CM

## 2020-07-17 ENCOUNTER — Other Ambulatory Visit: Payer: Self-pay | Admitting: Physician Assistant

## 2020-07-17 DIAGNOSIS — H0102A Squamous blepharitis right eye, upper and lower eyelids: Secondary | ICD-10-CM | POA: Diagnosis not present

## 2020-07-17 DIAGNOSIS — H40033 Anatomical narrow angle, bilateral: Secondary | ICD-10-CM | POA: Diagnosis not present

## 2020-07-17 DIAGNOSIS — H40023 Open angle with borderline findings, high risk, bilateral: Secondary | ICD-10-CM | POA: Diagnosis not present

## 2020-07-17 DIAGNOSIS — Z961 Presence of intraocular lens: Secondary | ICD-10-CM | POA: Diagnosis not present

## 2020-07-17 DIAGNOSIS — H04123 Dry eye syndrome of bilateral lacrimal glands: Secondary | ICD-10-CM | POA: Diagnosis not present

## 2020-07-17 DIAGNOSIS — R519 Headache, unspecified: Secondary | ICD-10-CM | POA: Diagnosis not present

## 2020-07-17 DIAGNOSIS — H35372 Puckering of macula, left eye: Secondary | ICD-10-CM | POA: Diagnosis not present

## 2020-07-17 DIAGNOSIS — M316 Other giant cell arteritis: Secondary | ICD-10-CM | POA: Diagnosis not present

## 2020-07-17 DIAGNOSIS — H0102B Squamous blepharitis left eye, upper and lower eyelids: Secondary | ICD-10-CM | POA: Diagnosis not present

## 2020-07-18 ENCOUNTER — Other Ambulatory Visit: Payer: Self-pay | Admitting: Physician Assistant

## 2020-07-18 NOTE — Telephone Encounter (Signed)
Tizanidine prescribed for neck and shoulder strain/tension on 06/26/20 No follow up scheduled.

## 2020-07-20 ENCOUNTER — Ambulatory Visit
Admission: RE | Admit: 2020-07-20 | Discharge: 2020-07-20 | Disposition: A | Discharge status: Home / Self Care | Payer: Medicare HMO | Source: Ambulatory Visit | Attending: Physician Assistant | Admitting: Physician Assistant

## 2020-07-20 ENCOUNTER — Other Ambulatory Visit: Payer: Self-pay | Admitting: Physician Assistant

## 2020-07-20 ENCOUNTER — Other Ambulatory Visit: Payer: Self-pay

## 2020-07-20 DIAGNOSIS — Z1231 Encounter for screening mammogram for malignant neoplasm of breast: Secondary | ICD-10-CM

## 2020-07-21 ENCOUNTER — Telehealth: Payer: Medicare HMO | Admitting: Nurse Practitioner

## 2020-07-21 DIAGNOSIS — B9789 Other viral agents as the cause of diseases classified elsewhere: Secondary | ICD-10-CM | POA: Diagnosis not present

## 2020-07-21 DIAGNOSIS — J329 Chronic sinusitis, unspecified: Secondary | ICD-10-CM

## 2020-07-21 DIAGNOSIS — J019 Acute sinusitis, unspecified: Secondary | ICD-10-CM

## 2020-07-21 MED ORDER — AZELASTINE HCL 0.1 % NA SOLN: 2.0000 | Freq: Two times a day (BID) | NASAL | 3 refills | Status: DC

## 2020-07-21 NOTE — Progress Notes (Signed)
We are sorry that you are not feeling well.  Here is how we plan to help!  Based on what you have shared with me it looks like you have sinusitis.  Sinusitis is inflammation and infection in the sinus cavities of the head.  Based on your presentation I believe you most likely have Acute Viral Sinusitis.This is an infection most likely caused by a virus. There is not specific treatment for viral sinusitis other than to help you with the symptoms until the infection runs its course.  You may use an oral decongestant such as Mucinex D or if you have glaucoma or high blood pressure use plain Mucinex. Saline nasal spray help and can safely be used as often as needed for congestion, I have prescribed: Azelastine nasal spray 2 sprays in each nostril twice a day  Some authorities believe that zinc sprays or the use of Echinacea may shorten the course of your symptoms.  Sinus infections are not as easily transmitted as other respiratory infection, however we still recommend that you avoid close contact with loved ones, especially the very young and elderly.  Remember to wash your hands thoroughly throughout the day as this is the number one way to prevent the spread of infection!  Home Care:  Only take medications as instructed by your medical team.  Do not take these medications with alcohol.  A steam or ultrasonic humidifier can help congestion.  You can place a towel over your head and breathe in the steam from hot water coming from a faucet.  Avoid close contacts especially the very young and the elderly.  Cover your mouth when you cough or sneeze.  Always remember to wash your hands.  Get Help Right Away If:  You develop worsening fever or sinus pain.  You develop a severe head ache or visual changes.  Your symptoms persist after you have completed your treatment plan.  Make sure you  Understand these instructions.  Will watch your condition.  Will get help right away if you are not  doing well or get worse.  Your e-visit answers were reviewed by a board certified advanced clinical practitioner to complete your personal care plan.  Depending on the condition, your plan could have included both over the counter or prescription medications.  If there is a problem please reply  once you have received a response from your provider.  Your safety is important to [place].  If you have drug allergies check your prescription carefully.    You can use MyChart to ask questions about today's visit, request a non-urgent call back, or ask for a work or school excuse for 24 hours related to this e-Visit. If it has been greater than 24 hours you will need to follow up with your provider, or enter a new e-Visit to address those concerns.  You will get an e-mail in the next two days asking about your experience.  I hope that your e-visit has been valuable and will speed your recovery. Thank you for using e-visits.  5-10 minutes spent reviewing and documenting in chart.

## 2020-07-24 ENCOUNTER — Emergency Department (HOSPITAL_COMMUNITY): Payer: Medicare HMO

## 2020-07-24 ENCOUNTER — Ambulatory Visit (HOSPITAL_COMMUNITY)
Admission: EM | Admit: 2020-07-24 | Discharge: 2020-07-24 | Disposition: A | Discharge status: Home / Self Care | Payer: Medicare HMO

## 2020-07-24 ENCOUNTER — Emergency Department (HOSPITAL_COMMUNITY)
Admission: EM | Admit: 2020-07-24 | Discharge: 2020-07-24 | Disposition: A | Discharge status: Home / Self Care | Payer: Medicare HMO | Attending: Emergency Medicine | Admitting: Emergency Medicine

## 2020-07-24 DIAGNOSIS — R42 Dizziness and giddiness: Secondary | ICD-10-CM | POA: Diagnosis not present

## 2020-07-24 DIAGNOSIS — I10 Essential (primary) hypertension: Secondary | ICD-10-CM | POA: Insufficient documentation

## 2020-07-24 DIAGNOSIS — R110 Nausea: Secondary | ICD-10-CM | POA: Insufficient documentation

## 2020-07-24 DIAGNOSIS — H538 Other visual disturbances: Secondary | ICD-10-CM | POA: Insufficient documentation

## 2020-07-24 DIAGNOSIS — J45909 Unspecified asthma, uncomplicated: Secondary | ICD-10-CM | POA: Insufficient documentation

## 2020-07-24 DIAGNOSIS — Z7982 Long term (current) use of aspirin: Secondary | ICD-10-CM | POA: Insufficient documentation

## 2020-07-24 DIAGNOSIS — R519 Headache, unspecified: Secondary | ICD-10-CM | POA: Diagnosis not present

## 2020-07-24 DIAGNOSIS — Z79899 Other long term (current) drug therapy: Secondary | ICD-10-CM | POA: Insufficient documentation

## 2020-07-24 DIAGNOSIS — Z87891 Personal history of nicotine dependence: Secondary | ICD-10-CM | POA: Insufficient documentation

## 2020-07-24 DIAGNOSIS — Z85048 Personal history of other malignant neoplasm of rectum, rectosigmoid junction, and anus: Secondary | ICD-10-CM | POA: Diagnosis not present

## 2020-07-24 LAB — DIFFERENTIAL
Abs Immature Granulocytes: 0.02 10*3/uL (ref 0.00–0.07)
Basophils Absolute: 0.1 10*3/uL (ref 0.0–0.1)
Basophils Relative: 1 %
Eosinophils Absolute: 0.1 10*3/uL (ref 0.0–0.5)
Eosinophils Relative: 1 %
Immature Granulocytes: 0 %
Lymphocytes Relative: 28 %
Lymphs Abs: 2.4 10*3/uL (ref 0.7–4.0)
Monocytes Absolute: 0.7 10*3/uL (ref 0.1–1.0)
Monocytes Relative: 9 %
Neutro Abs: 5.2 10*3/uL (ref 1.7–7.7)
Neutrophils Relative %: 61 %

## 2020-07-24 LAB — COMPREHENSIVE METABOLIC PANEL
ALT: 15 U/L (ref 0–44)
AST: 16 U/L (ref 15–41)
Albumin: 3.9 g/dL (ref 3.5–5.0)
Alkaline Phosphatase: 89 U/L (ref 38–126)
Anion gap: 10 (ref 5–15)
BUN: 7 mg/dL — ABNORMAL LOW (ref 8–23)
CO2: 27 mmol/L (ref 22–32)
Calcium: 9.6 mg/dL (ref 8.9–10.3)
Chloride: 103 mmol/L (ref 98–111)
Creatinine, Ser: 0.74 mg/dL (ref 0.44–1.00)
GFR, Estimated: >60 mL/min (ref >60)
Glucose, Bld: 110 mg/dL — ABNORMAL HIGH (ref 70–99)
Potassium: 3.8 mmol/L (ref 3.5–5.1)
Sodium: 140 mmol/L (ref 135–145)
Total Bilirubin: 0.4 mg/dL (ref 0.3–1.2)
Total Protein: 6.9 g/dL (ref 6.5–8.1)

## 2020-07-24 LAB — CBC
HCT: 41.6 % (ref 36.0–46.0)
Hemoglobin: 14.3 g/dL (ref 12.0–15.0)
MCH: 32.5 pg (ref 26.0–34.0)
MCHC: 34.4 g/dL (ref 30.0–36.0)
MCV: 94.5 fL (ref 80.0–100.0)
Platelets: 327 10*3/uL (ref 150–400)
RBC: 4.4 MIL/uL (ref 3.87–5.11)
RDW: 12 % (ref 11.5–15.5)
WBC: 8.5 10*3/uL (ref 4.0–10.5)
nRBC: 0 % (ref 0.0–0.2)

## 2020-07-24 LAB — APTT: aPTT: 28 seconds (ref 24–36)

## 2020-07-24 LAB — PROTIME-INR
INR: 1 (ref 0.8–1.2)
Prothrombin Time: 13.1 seconds (ref 11.4–15.2)

## 2020-07-24 LAB — I-STAT CHEM 8, ED
BUN: 8 mg/dL (ref 8–23)
Calcium, Ion: 1.24 mmol/L (ref 1.15–1.40)
Chloride: 102 mmol/L (ref 98–111)
Creatinine, Ser: 0.7 mg/dL (ref 0.44–1.00)
Glucose, Bld: 105 mg/dL — ABNORMAL HIGH (ref 70–99)
HCT: 42 % (ref 36.0–46.0)
Hemoglobin: 14.3 g/dL (ref 12.0–15.0)
Potassium: 3.8 mmol/L (ref 3.5–5.1)
Sodium: 141 mmol/L (ref 135–145)
TCO2: 27 mmol/L (ref 22–32)

## 2020-07-24 MED ORDER — SODIUM CHLORIDE 0.9% FLUSH: 3.0000 mL | Freq: Once | INTRAVENOUS | Status: DC

## 2020-07-24 NOTE — ED Notes (Signed)
Patient very specific about an area on the left side of her head that has been hurting for 3 days.  Patient states she woke this morning at 6 am with dizziness, lethargy, fatigue, blurred vision.  States she called pcp, and was told to come to [facility].  This nurse spike to provider.  Augusto Gamble, NP recommended ED due to neurological status of complaint and possible CT evaluation

## 2020-07-24 NOTE — ED Triage Notes (Signed)
Pt reports 2 days ago she began to have a left sided headache and woke up this morning feeling off with blurry vision and feeling disoriented, she went to the kitchen and was brushing her teeth over her dishes. She also woke up in the middle of the night with sob and heart fluttering for the last 2 days.

## 2020-07-24 NOTE — ED Notes (Signed)
Patient is being discharged from the Urgent Care and sent to the Emergency Department via private vehicle . Per Augusto Gamble, NP, patient is in need of higher level of care due to neurological complaints. Patient is aware and verbalizes understanding of plan of care. There were no vitals filed for this visit.

## 2020-07-24 NOTE — ED Provider Notes (Signed)
Patient is a 69 year old female, she presents after having a left-sided headache for couple of days, states that the headache is actually getting much better and now she has a residual tenderness to palpation in the left parietal scalp closer to the top of the head, there is no temporal artery tenderness.  She has normal cranial nerves III through XII other than being hard of hearing and states that she has had some difficulty hearing out of her left ear over time.  On exam her right tympanic membrane is obstructed by cerumen however the left tympanic membrane is very clear, translucent, there is no purulent effusion.  She is able to move all 4 extremities with normal strength, no pronator drift, normal finger-nose-finger, normal speech and normal memory.  It is not clear why the patient is having this mild headache, the CT scans are unremarkable, she does have some reproducible tenderness over the scalp, she did have a brief episode of disorientation where she found her self pressure teeth at the sink but at this time does not feel abnormal and her husband at the bedside states that she looks normal to her.  Start ASA and f/u with neuro for CT findings - not new.  Medical screening examination/treatment/procedure(s) were conducted as a shared visit with non-physician practitioner(s) and myself.  I personally evaluated the patient during the encounter.  Clinical Impression:   Final diagnoses:  Bad headache         Noemi Chapel, MD 07/25/20 [phone]

## 2020-07-24 NOTE — ED Provider Notes (Addendum)
[facility] EMERGENCY DEPARTMENT Provider Note   CSN: [id number] Arrival date & time: 07/24/20  1031     History Chief Complaint  Patient presents with  . Headache  . Dizziness    Megan [name] is a 69 y.o. female presenting to the ED with complain of left sided headache that began earlier this week, described as a throbbing pain.  Pain was worse in the last 3 days, throbbing pain from the top of her parietal scalp radiating down towards her ear.  She was having some mild generalized intermittent blurry vision, nausea without vomiting.  She had no diplopia or loss of vision.  This morning she woke up in her headache had resolved, however she felt generalized fatigue.  She had a brief episode of disorientation where she was brushing her teeth over the kitchen sink, however quickly realized that she was doing.  She denies any symptoms of speech difficulties, facial droop, aphasia, unilateral weakness or numbness, gait instability.  She has had no new medications or head trauma.  She states earlier this week she had her eyes dilated at the ophthalmologist, however she had a mild headache prior to onset.  The history is provided by the patient.       Past Medical History:  Diagnosis Date  . 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 Allergic state 12/31/2012  . Anxiety   . Asthma   . Barrett esophagus   . Blind loop syndrome   . C. difficile diarrhea   . Cancer ([place])   . Chest pain, atypical 07/14/2011  . Cold sore 07/17/2016  . Colon polyp   . Contact dermatitis 03/23/2012  . Cystocele, midline   . Depression   . Diarrhea   . Diverticulosis   . Endometrial hyperplasia 02/27/2006   BENIGN ENDO BX ON 02/2007  . GERD (gastroesophageal reflux disease)   . Headache 04/16/2015  . Headache   . Headache(784.0) 04/15/2012  . Hematuria 04/27/2012  . Hepatic cyst   . Hiatal hernia   . Hiatal hernia   . Hyperlipidemia, mixed 12/17/2014  .  Hypertension   . IBS (irritable bowel syndrome)   . Insomnia   . Leaky heart valve   . Low back pain 08/15/2013  . Osteoporosis 03/2017   T score -2.5. Without significant loss from prior studies  . 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 Paronychia of great toe, left 06/19/2013  . Preventative health care 09/11/2014  . Rectal bleeding 10/10/2011  . Rectocele   . 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 Sinusitis acute 02/03/2012  . Thrush 07/21/2011  . 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 Unspecified hypothyroidism   . Unspecified menopausal and postmenopausal disorder   . Uterine prolapse without mention of vaginal wall prolapse   . Vitamin B12 deficiency   . Vitamin D deficiency 07/17/2016    Patient Active Problem List   Diagnosis Date Noted  . 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 Closed fracture of right distal radius 01/12/2019  . Moderate obstructive sleep apnea 05/21/2017  . Nausea 04/12/2017  . Vitamin D deficiency 07/17/2016  . Abdominal aortic atherosclerosis ([place]) 07/16/2016  . Renal artery stenosis ([place]) 06/12/2016  . Chronic low back pain 08/24/2015  . Hyperlipidemia, mixed 12/17/2014  . Preventative health care 09/11/2014  . Hepatic cyst 05/22/2014  . Insomnia 03/10/2014  . Low back pain 08/15/2013  . 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 Osteoporosis 09/07/2012  . 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 Tricuspid  regurgitation 07/21/2011  . Mitral regurgitation 07/21/2011  . Gastroesophageal reflux disease with hiatal hernia 06/17/2011  . Endometrial hyperplasia   . Diverticulosis   . IBS (irritable bowel syndrome) 04/24/2011  . Barrett's esophagus 03/04/2011  . Hiatal hernia 03/04/2011  . BASAL CELL CARCINOMA, FACE 11/15/2009  . Hyperglycemia 06/05/2009  . Vitamin B12 deficiency 02/15/2009  . 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 Essential hypertension 01/04/2009  . ANXIETY DEPRESSION 12/14/2007    Past Surgical History:  Procedure Laterality Date  . APPENDECTOMY     . endoscopic hemoclip    . eye surgery Left    lens implant  . HYSTEROSCOPY     POLYP  . OPEN REDUCTION INTERNAL FIXATION (ORIF) DISTAL RADIAL FRACTURE Left 01/17/2019   Procedure: LEFT OPEN REDUCTION INTERNAL FIXATION (ORIF) DISTAL RADIUS FRACTURE;  Surgeon: Daryll Brod, MD;  Location: [facility];  Service: Orthopedics;  Laterality: Left;  AXILLARY BLOCK  . PERIPHERAL VASCULAR CATHETERIZATION Left 07/24/2016   Procedure: Renal Angiography;  Surgeon: Conrad [place], MD;  Location: [facility];  Service: Cardiovascular;  Laterality: Left;  . UPPER GI ENDOSCOPY    . UTERINE FIBROID SURGERY       OB History    Gravida  6   Para  4   Term      Preterm      AB  2   Living  4     SAB      TAB      Ectopic      Multiple      Live Births              Family History  Problem Relation Age of Onset  . Colon cancer Mother   . Diabetes Mother   . Hypertension Mother   . Colon polyps Father   . Parkinson's disease Father   . Colon cancer Maternal Grandmother   . Breast cancer Maternal Grandmother 32  . Diabetes Paternal Grandmother   . Breast cancer Paternal Grandmother 75  . Allergies Sister   . Arthritis Brother        b/l hip replace  . Alcohol abuse Daughter   . Arthritis Son   . Hypertension Son   . Allergies Son   . Osteoporosis Son   . Osteoporosis Son   . Arthritis Son        back disease, DDD  . Allergies Son   . Arthritis Son   . Irritable bowel syndrome Son        RSD  . Hypertension Paternal Grandfather   . Heart disease Paternal Grandfather   . Aneurysm Paternal Grandfather   . Colon cancer Maternal Aunt   . Colon cancer Cousin        X3  . Ovarian cancer Cousin     Social History   Tobacco Use  . Smoking status: Former Smoker    Quit date: 08/19/1995    Years since quitting: 24.9  . Smokeless tobacco: Never Used  Vaping Use  . Vaping Use: Never used  Substance Use Topics  . Alcohol use: No    Alcohol/week: 0.0  standard drinks  . Drug use: No    Types: Hydrocodone    Home Medications Prior to Admission medications   Medication Sig Start Date End Date Taking? Authorizing Provider  albuterol (VENTOLIN HFA) 108 (90 Base) MCG/ACT inhaler Inhale 2 puffs into the lungs every 6 (six) hours as needed for wheezing or shortness of breath. 06/17/19  Brunetta Jeans, PA-C  ALPRAZolam Duanne Moron) 1 MG tablet TAKE 1 TABLET BY MOUTH TWICE A DAY AS NEEDED NEEDS APPT FOR REFILLS 06/29/20   Brunetta Jeans, PA-C  aspirin EC 81 MG tablet Take 1 tablet (81 mg total) by mouth daily. 06/15/16   [name], MD  azelastine (ASTELIN) 0.1 % nasal spray Place 2 sprays into both nostrils 2 (two) times daily. Use in each nostril as directed 07/21/20   Hassell Done, Mary-Margaret, FNP  baclofen (LIORESAL) 10 MG tablet Take 0.5-1 tablets (5-10 mg total) by mouth 3 (three) times daily as needed for muscle spasms. 01/27/20   Hilts, Legrand Como, MD  Biotin 1000 MCG tablet Take 1,000 mcg by mouth 3 (three) times daily.    [provider]  Cholecalciferol (VITAMIN D-3) 5000 units TABS Take 1 tablet by mouth daily. 03/04/17   Brunetta Jeans, PA-C  Cyanocobalamin (VITAMIN B-12 PO) Take 1 tablet by mouth daily.    [provider]  diltiazem (CARTIA XT) 180 MG 24 hr capsule Take 1 capsule (180 mg total) by mouth daily. 12/14/19 12/08/20  Sherren Mocha, MD  linaclotide  [facility]) 72 MCG capsule Take 72 mcg by mouth daily as needed. constipation 12/16/18   [provider]  metoprolol succinate (TOPROL XL) 25 MG 24 hr tablet Take 1 tablet (25 mg total) by mouth daily. 04/17/20   Sherren Mocha, MD  Multiple Minerals (CALCIUM/MAGNESIUM/ZINC PO) Take 1 tablet by mouth daily.    [provider]  omeprazole (PRILOSEC) 40 MG capsule  04/30/20   [provider]  ondansetron (ZOFRAN) 4 MG tablet TAKE 1 TABLET BY MOUTH EVERY 8 HOURS AS NEEDED FOR NAUSEA / VOMITING 06/06/19   [provider]  promethazine  (PHENERGAN) 25 MG tablet TAKE 1 TABLET BY MOUTH EVERY 6 HOURS AS NEEDED NAUSEA 05/22/20   [provider]  sucralfate (CARAFATE) 1 g tablet Take 1 g by mouth 4 (four) times daily -  with meals and at bedtime.    [provider]  tiZANidine (ZANAFLEX) 4 MG tablet Take 1 tablet (4 mg total) by mouth at bedtime. 06/26/20   Brunetta Jeans, PA-C  vitamin E 400 UNIT capsule Take 400 Units by mouth daily.    [provider]    Allergies    Iodine-131, Sulfamethoxazole, Amitriptyline, Effexor [venlafaxine], Iohexol, Lactose intolerance (gi), Lexapro [escitalopram oxalate], Iodinated diagnostic agents, and Prednisone  Review of Systems   Review of Systems  All other systems reviewed and are negative.   Physical Exam Updated Vital Signs BP 133/62   Pulse (!) 59   Temp 97.7 F (36.5 C) (Oral)   Resp 17   Ht 5\' 6"  (1.676 m)   Wt 65.8 kg   SpO2 99%   BMI 23.40 kg/m   Physical Exam Vitals and nursing note reviewed.  Constitutional:      General: She is not in acute distress.    Appearance: She is well-developed. She is not ill-appearing.  HENT:     Head: Normocephalic and atraumatic.     Comments: No TTP over temporal region    Right Ear: There is impacted cerumen.     Left Ear: Tympanic membrane, ear canal and external ear normal.  Eyes:     Conjunctiva/sclera: Conjunctivae normal.  Cardiovascular:     Rate and Rhythm: Normal rate and regular rhythm.  Pulmonary:     Effort: Pulmonary effort is normal. No respiratory distress.     Breath sounds: Normal breath sounds.  Abdominal:  General: Bowel sounds are normal. There is no distension.     Palpations: Abdomen is soft.  Skin:    General: Skin is warm.  Neurological:     Mental Status: She is alert.     Comments: Mental Status:  Alert, oriented, thought content appropriate, able to give a coherent history. Speech fluent without evidence of aphasia. Able to follow 2 step commands without difficulty.   Cranial Nerves:  II:   pupils equal, round, reactive to light III,IV, VI: ptosis not present, extra-ocular motions intact bilaterally  V,VII: smile symmetric, facial light touch sensation equal VIII: hearing grossly normal to voice  X: uvula elevates symmetrically  XI: bilateral shoulder shrug symmetric and strong XII: midline tongue extension without fassiculations Motor:  Normal tone. 5/5 strength in upper and lower extremities bilaterally including strong and equal grip strength and dorsiflexion/plantar flexion Sensory: grossly normal in all extremities.  Cerebellar: normal finger-to-nose with bilateral upper extremities Gait: normal gait and balance CV: distal pulses palpable throughout    Psychiatric:        Behavior: Behavior normal.     ED Results / Procedures / Treatments   Labs (all labs ordered are listed, but only abnormal results are displayed) Labs Reviewed  COMPREHENSIVE METABOLIC PANEL - Abnormal; Notable for the following components:      Result Value   Glucose, Bld 110 (*)    BUN 7 (*)    All other components within normal limits  I-STAT CHEM 8, ED - Abnormal; Notable for the following components:   Glucose, Bld 105 (*)    All other components within normal limits  PROTIME-INR  APTT  CBC  DIFFERENTIAL  CBG MONITORING, ED    EKG EKG Interpretation  Date/Time:  Tuesday July 24 2020 11:00:59 EST Ventricular Rate:  69 PR Interval:  134 QRS Duration: 110 QT Interval:  388 QTC Calculation: 415 R Axis:   84 Text Interpretation: Normal sinus rhythm Incomplete right bundle branch block Borderline ECG since last tracing no significant change Confirmed by Noemi Chapel [phone]) on 07/24/2020 2:34:32 PM   Radiology CT HEAD WO CONTRAST  Result Date: 07/24/2020 CLINICAL DATA:  Left side headache, blurred vision, altered mental status EXAM: CT HEAD WITHOUT CONTRAST TECHNIQUE: Contiguous axial images were obtained from the base of the skull through the  vertex without intravenous contrast. COMPARISON:  01/14/2016 FINDINGS: Brain: Chronic small vessel disease throughout the deep white matter. Low-density areas seen within the left basal ganglia compatible with age indeterminate lacunar infarct. Physiologic calcifications in the basal ganglia. No hemorrhage or hydrocephalus. Vascular: No hyperdense vessel or unexpected calcification. Skull: No acute calvarial abnormality. Sinuses/Orbits: Visualized paranasal sinuses and mastoids clear. Orbital soft tissues unremarkable. Other: None IMPRESSION: Area of low-density in the left basal ganglia compatible with age indeterminate lacunar infarct. Chronic small vessel disease within the deep white matter. Electronically Signed   By: Rolm Baptise M.D.   On: 07/24/2020 11:39    Procedures Procedures (including critical care time)  Medications Ordered in ED Medications  sodium chloride flush (NS) 0.9 % injection 3 mL (has no administration in time range)    ED Course  I have reviewed the triage vital signs and the nursing notes.  Pertinent labs & imaging results that were available during my care of the patient were reviewed by me and considered in my medical decision making (see chart for details).    MDM Rules/Calculators/A&P  Patient presenting for 3 days of left-sided headache radiating into her ear, which resolved overnight last night.  She had some nausea with her headache, however no other focal neuro deficits.  Today she had a brief episode of disorientation however quickly realized what she was doing, brushing her teeth over the kitchen sink.  She had no other neuro deficits at the time, is asymptomatic currently.  On evaluation she is well-appearing in no distress. No deficits on exam, blood pressure was initially high on arrival, however is improved on evaluation.  She has no tenderness to the left temple region, TM exam is unremarkable.  Imaging and laboratory work-up was  initiated in triage which is overall very reassuring.  No acute laboratory abnormalities, EKG is sinus rhythm.  CT head with findings consistent with age-indeterminate lacunar infarct and chronic small vessel disease.  Patient discussed with and evaluated by attending Dr. Sabra Heck.  Patient is currently asymptomatic, reassuring work-up and examination today, recommend discharge. CT findings are unlikely acute. Recommend Continue low-dose aspirin daily follow closely outpatient.  Return if symptoms recur or worsen in any way.  Patient has agreeable to plan and appropriate for discharge.  Discussed results, findings, treatment and follow up. Patient advised of return precautions. Patient verbalized understanding and agreed with plan.  Final Clinical Impression(s) / ED Diagnoses Final diagnoses:  Bad headache    Rx / DC Orders ED Discharge Orders    None       Izel Eisenhardt, [place] N, PA-C 07/24/20 1615    Jemal Miskell, [place] N, PA-C 07/24/20 1616    Noemi Chapel, MD 07/25/20 [phone]

## 2020-07-24 NOTE — Discharge Instructions (Addendum)
Please follow closely with your primary care provider regarding your visit today. Please return to the ED for any recurrent or worsening symptoms.

## 2020-07-25 ENCOUNTER — Other Ambulatory Visit: Payer: Self-pay | Admitting: Physician Assistant

## 2020-07-25 DIAGNOSIS — F341 Dysthymic disorder: Secondary | ICD-10-CM

## 2020-07-25 NOTE — Telephone Encounter (Signed)
Xanax last rx 06/29/20 #30 LOV:  05/25/20 Anxiety/Depression CSC: 01/14/18

## 2020-07-25 NOTE — Telephone Encounter (Signed)
LFD 06/29/20 #30 with no refills LOV 06/26/20 NOV none

## 2020-08-01 ENCOUNTER — Encounter: Payer: Self-pay | Admitting: Physician Assistant

## 2020-08-01 ENCOUNTER — Ambulatory Visit (INDEPENDENT_AMBULATORY_CARE_PROVIDER_SITE_OTHER): Payer: Medicare HMO | Admitting: Physician Assistant

## 2020-08-01 ENCOUNTER — Other Ambulatory Visit: Payer: Self-pay

## 2020-08-01 VITALS — BP 120/80 | HR 70 | Temp 98.1°F | Resp 14 | Ht 66.0 in | Wt 143.0 lb

## 2020-08-01 DIAGNOSIS — M5481 Occipital neuralgia: Secondary | ICD-10-CM | POA: Diagnosis not present

## 2020-08-01 MED ORDER — GABAPENTIN 100 MG PO CAPS: 100.0000 mg | ORAL_CAPSULE | Freq: Three times a day (TID) | ORAL | 3 refills | Status: DC

## 2020-08-01 NOTE — Progress Notes (Signed)
Patient presents to clinic today for ER follow-up.  Patient presented to [facility] ER after being triaged at urgent care for further evaluation of severe headache on 07/24/2020.  ER evaluation at that time included labs which were overall unremarkable and CT head was revealed evidence of prior small lacunar infarct.  No acute findings.  Patient was instructed to continue daily low-dose aspirin and follow-up with primary care.  Since ER visit, patient endorses daily headache.  Describes this headache as a sensitivity of the skin of scalp in the temporal and occipital region.  States is not really a headache but just a pain in this area.  Sometimes feels like an electric shock.  Denies any decreased range of motion of neck.  Denies any vision changes or change to hearing.  Denies any new or worsening symptoms.  Past Medical History:  Diagnosis Date  . 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 Allergic state 12/31/2012  . Anxiety   . Asthma   . Barrett esophagus   . Blind loop syndrome   . C. difficile diarrhea   . Cancer ([place])   . Chest pain, atypical 07/14/2011  . Cold sore 07/17/2016  . Colon polyp   . Contact dermatitis 03/23/2012  . Cystocele, midline   . Depression   . Diarrhea   . Diverticulosis   . Endometrial hyperplasia 02/27/2006   BENIGN ENDO BX ON 02/2007  . GERD (gastroesophageal reflux disease)   . Headache 04/16/2015  . Headache   . Headache(784.0) 04/15/2012  . Hematuria 04/27/2012  . Hepatic cyst   . Hiatal hernia   . Hiatal hernia   . Hyperlipidemia, mixed 12/17/2014  . Hypertension   . IBS (irritable bowel syndrome)   . Insomnia   . Leaky heart valve   . Low back pain 08/15/2013  . Osteoporosis 03/2017   T score -2.5. Without significant loss from prior studies  . 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 Paronychia of great toe, left 06/19/2013  . Preventative health care 09/11/2014  . Rectal bleeding 10/10/2011  . Rectocele   . Sacroiliac joint  disease 04/27/2012  . Sinusitis acute 02/03/2012  . Thrush 07/21/2011  . 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 Unspecified hypothyroidism   . Unspecified menopausal and postmenopausal disorder   . Uterine prolapse without mention of vaginal wall prolapse   . Vitamin B12 deficiency   . Vitamin D deficiency 07/17/2016    Current Outpatient Medications on File Prior to Visit  Medication Sig Dispense Refill  . albuterol (VENTOLIN HFA) 108 (90 Base) MCG/ACT inhaler Inhale 2 puffs into the lungs every 6 (six) hours as needed for wheezing or shortness of breath. 8 g 0  . ALPRAZolam (XANAX) 1 MG tablet TAKE 1 TABLET BY MOUTH TWICE A DAY AS NEEDED NEEDS APPT FOR REFILLS 30 tablet 0  . aspirin EC 81 MG tablet Take 1 tablet (81 mg total) by mouth daily.    Marland Kitchen azelastine (ASTELIN) 0.1 % nasal spray Place 2 sprays into both nostrils 2 (two) times daily. Use in each nostril as directed 30 mL 3  . baclofen (LIORESAL) 10 MG tablet Take 0.5-1 tablets (5-10 mg total) by mouth 3 (three) times daily as needed for muscle spasms. 30 each 3  . Biotin 1000 MCG tablet Take 1,000 mcg by mouth 3 (three) times daily.    . Cholecalciferol (VITAMIN D-3) 5000 units TABS Take 1 tablet by mouth daily. 30 tablet 0  . Cyanocobalamin (VITAMIN B-12 PO)  Take 1 tablet by mouth daily.    Marland Kitchen diltiazem (CARTIA XT) 180 MG 24 hr capsule Take 1 capsule (180 mg total) by mouth daily. 90 capsule 3  . linaclotide (LINZESS) 72 MCG capsule Take 72 mcg by mouth daily as needed. constipation    . metoprolol succinate (TOPROL XL) 25 MG 24 hr tablet Take 1 tablet (25 mg total) by mouth daily. 90 tablet 1  . Multiple Minerals (CALCIUM/MAGNESIUM/ZINC PO) Take 1 tablet by mouth daily.    Marland Kitchen omeprazole (PRILOSEC) 40 MG capsule     . ondansetron (ZOFRAN) 4 MG tablet TAKE 1 TABLET BY MOUTH EVERY 8 HOURS AS NEEDED FOR NAUSEA / VOMITING    . promethazine (PHENERGAN) 25 MG tablet TAKE 1 TABLET BY MOUTH EVERY 6 HOURS AS NEEDED NAUSEA    . sucralfate  (CARAFATE) 1 g tablet Take 1 g by mouth 4 (four) times daily -  with meals and at bedtime.    Marland Kitchen tiZANidine (ZANAFLEX) 4 MG tablet Take 1 tablet (4 mg total) by mouth at bedtime. 30 tablet 0  . vitamin E 400 UNIT capsule Take 400 Units by mouth daily.     No current facility-administered medications on file prior to visit.    Allergies  Allergen Reactions  . Iodine-131 Other (See Comments)    Other reaction(s): Dizziness (intolerance)  . Sulfamethoxazole Shortness Of Breath    chest tightness  . Amitriptyline Anxiety and Other (See Comments)    Depression, insomnia  . Effexor [Venlafaxine]     palpitations  . Iohexol Other (See Comments)    Passed out  . Lactose Intolerance (Gi) Diarrhea and Nausea And Vomiting  . Lexapro [Escitalopram Oxalate]     nausea  . Iodinated Diagnostic Agents     Pt states at Dr.Grapeys office 15 years ago blacked out for 2 hours during injection for IVP. Pt says she had dye recently with no problems   . Prednisone Palpitations    Family History  Problem Relation Age of Onset  . Colon cancer Mother   . Diabetes Mother   . Hypertension Mother   . Colon polyps Father   . Parkinson's disease Father   . Colon cancer Maternal Grandmother   . Breast cancer Maternal Grandmother 57  . Diabetes Paternal Grandmother   . Breast cancer Paternal Grandmother 26  . Allergies Sister   . Arthritis Brother        b/l hip replace  . Alcohol abuse Daughter   . Arthritis Son   . Hypertension Son   . Allergies Son   . Osteoporosis Son   . Osteoporosis Son   . Arthritis Son        back disease, DDD  . Allergies Son   . Arthritis Son   . Irritable bowel syndrome Son        RSD  . Hypertension Paternal Grandfather   . Heart disease Paternal Grandfather   . Aneurysm Paternal Grandfather   . Colon cancer Maternal Aunt   . Colon cancer Cousin        X3  . Ovarian cancer Cousin     Social History   Socioeconomic History  . Marital status: Married     Spouse name: Not on file  . Number of children: 4  . Years of education: 31  . Highest education level: High school graduate  Occupational History  . Occupation: Retired  Tobacco Use  . Smoking status: Former Smoker    Quit date: 08/19/1995  Years since quitting: 24.9  . Smokeless tobacco: Never Used  Vaping Use  . Vaping Use: Never used  Substance and Sexual Activity  . Alcohol use: No    Alcohol/week: 0.0 standard drinks  . Drug use: No    Types: Hydrocodone  . Sexual activity: Not Currently    Birth control/protection: Post-menopausal    Comment: 1st intercourse 66 yo-5 partners  Other Topics Concern  . Not on file  Social History Narrative   Lives at home with her husband.   Right-handed.   2 cups caffeine per day.   Social Determinants of Health   Financial Resource Strain: Not on file  Food Insecurity: Not on file  Transportation Needs: Not on file  Physical Activity: Not on file  Stress: Not on file  Social Connections: Not on file    Review of Systems - See HPI.  All other ROS are negative.  BP 120/80   Pulse 70   Temp 98.1 F (36.7 C) (Temporal)   Resp 14   Ht 5\' 6"  (1.676 m)   Wt 143 lb (64.9 kg)   SpO2 99%   BMI 23.08 kg/m   Physical Exam  Recent Results (from the past 2160 hour(s))  TSH     Status: None   Collection Time: 06/26/20  1:37 PM  Result Value Ref Range   TSH 0.79 0.35 - 4.50 uIU/mL  CBC w/Diff     Status: None   Collection Time: 06/26/20  1:37 PM  Result Value Ref Range   WBC 5.8 4.0 - 10.5 K/uL   RBC 4.21 3.87 - 5.11 Mil/uL   Hemoglobin 13.4 12.0 - 15.0 g/dL   HCT 39.4 36.0 - 46.0 %   MCV 93.6 78.0 - 100.0 fl   MCHC 34.0 30.0 - 36.0 g/dL   RDW 12.8 11.5 - 15.5 %   Platelets 289.0 150.0 - 400.0 K/uL   Neutrophils Relative % 47.9 43.0 - 77.0 %   Lymphocytes Relative 40.3 12.0 - 46.0 %   Monocytes Relative 9.4 3.0 - 12.0 %   Eosinophils Relative 1.7 0.0 - 5.0 %   Basophils Relative 0.7 0.0 - 3.0 %   Neutro Abs 2.8 1.4 -  7.7 K/uL   Lymphs Abs 2.3 0.7 - 4.0 K/uL   Monocytes Absolute 0.5 0.1 - 1.0 K/uL   Eosinophils Absolute 0.1 0.0 - 0.7 K/uL   Basophils Absolute 0.0 0.0 - 0.1 K/uL  Iron, TIBC and Ferritin Panel     Status: None   Collection Time: 06/26/20  1:37 PM  Result Value Ref Range   Iron 91 45 - 160 mcg/dL   TIBC 373 250 - 450 mcg/dL (calc)   %SAT 24 16 - 45 % (calc)   Ferritin 28 16 - 288 ng/mL  B12     Status: None   Collection Time: 06/26/20  1:37 PM  Result Value Ref Range   Vitamin B-12 536 211 - 911 pg/mL  Protime-INR     Status: None   Collection Time: 07/24/20 10:44 AM  Result Value Ref Range   Prothrombin Time 13.1 11.4 - 15.2 seconds   INR 1.0 0.8 - 1.2    Comment: (NOTE) INR goal varies based on device and disease states. Performed at [facility], [place] [address], [place], [place] [address]   APTT     Status: None   Collection Time: 07/24/20 10:44 AM  Result Value Ref Range   aPTT 28 24 - 36 seconds  Comment: Performed at [facility], [place] [address], [place], [place] [address]  CBC     Status: None   Collection Time: 07/24/20 10:44 AM  Result Value Ref Range   WBC 8.5 4.0 - 10.5 K/uL   RBC 4.40 3.87 - 5.11 MIL/uL   Hemoglobin 14.3 12.0 - 15.0 g/dL   HCT 41.6 36.0 - 46.0 %   MCV 94.5 80.0 - 100.0 fL   MCH 32.5 26.0 - 34.0 pg   MCHC 34.4 30.0 - 36.0 g/dL   RDW 12.0 11.5 - 15.5 %   Platelets 327 150 - 400 K/uL   nRBC 0.0 0.0 - 0.2 %    Comment: Performed at [facility], [place] [address] [place], [place] [address]  Differential     Status: None   Collection Time: 07/24/20 10:44 AM  Result Value Ref Range   Neutrophils Relative % 61 %   Neutro Abs 5.2 1.7 - 7.7 K/uL   Lymphocytes Relative 28 %   Lymphs Abs 2.4 0.7 - 4.0 K/uL   Monocytes Relative 9 %   Monocytes Absolute 0.7 0.1 - 1.0 K/uL   Eosinophils Relative 1 %   Eosinophils Absolute 0.1 0.0 - 0.5 K/uL   Basophils Relative 1 %   Basophils Absolute 0.1 0.0 - 0.1 K/uL   Immature  Granulocytes 0 %   Abs Immature Granulocytes 0.02 0.00 - 0.07 K/uL    Comment: Performed at [facility] [address] [place], [place] [address]  Comprehensive metabolic panel     Status: Abnormal   Collection Time: 07/24/20 10:44 AM  Result Value Ref Range   Sodium 140 135 - 145 mmol/L   Potassium 3.8 3.5 - 5.1 mmol/L   Chloride 103 98 - 111 mmol/L   CO2 27 22 - 32 mmol/L   Glucose, Bld 110 (H) 70 - 99 mg/dL    Comment: Glucose reference range applies only to samples taken after fasting for at least 8 hours.   BUN 7 (L) 8 - 23 mg/dL   Creatinine, Ser 0.74 0.44 - 1.00 mg/dL   Calcium 9.6 8.9 - 10.3 mg/dL   Total Protein 6.9 6.5 - 8.1 g/dL   Albumin 3.9 3.5 - 5.0 g/dL   AST 16 15 - 41 U/L   ALT 15 0 - 44 U/L   Alkaline Phosphatase 89 38 - 126 U/L   Total Bilirubin 0.4 0.3 - 1.2 mg/dL   GFR, Estimated >60 >60 mL/min    Comment: (NOTE) Calculated using the CKD-EPI Creatinine Equation (2021)    Anion gap 10 5 - 15    Comment: Performed at [facility] [address] [place], [place] [address]  I-stat chem 8, ED     Status: Abnormal   Collection Time: 07/24/20 11:05 AM  Result Value Ref Range   Sodium 141 135 - 145 mmol/L   Potassium 3.8 3.5 - 5.1 mmol/L   Chloride 102 98 - 111 mmol/L   BUN 8 8 - 23 mg/dL   Creatinine, Ser 0.70 0.44 - 1.00 mg/dL   Glucose, Bld 105 (H) 70 - 99 mg/dL    Comment: Glucose reference range applies only to samples taken after fasting for at least 8 hours.   Calcium, Ion 1.24 1.15 - 1.40 mmol/L   TCO2 27 22 - 32 mmol/L   Hemoglobin 14.3 12.0 - 15.0 g/dL   HCT 42.0 36.0 - 46.0 %    Assessment/Plan: 1. Occipital neuralgia of  left side CT negative for acute finding during ER evaluation.  Today neurological exam overall within normal limits.  The only thing abnormal noted is hyperesthesia and allodynia of left occipital and temporal region of scalp.  No rash or other skin changes noted.  Concern for simple neuralgia.  Will start  gabapentin 100 mg 3 times daily.  Will adjust dose based off of tolerability and response.  Referral to neurology placed for further evaluation and management giving prior CT findings and ongoing symptoms.  Strict ER precautions discussed with patient who voiced understanding and agreement with the plan. - gabapentin (NEURONTIN) 100 MG capsule; Take 1 capsule (100 mg total) by mouth 3 (three) times daily.  Dispense: 90 capsule; Refill: 3 - Ambulatory referral to Neurology  This visit occurred during the SARS-CoV-2 public health emergency.  Safety protocols were in place, including screening questions prior to the visit, additional usage of staff PPE, and extensive cleaning of exam room while observing appropriate contact time as indicated for disinfecting solutions.     Leeanne Rio, PA-C

## 2020-08-01 NOTE — Patient Instructions (Signed)
-   Your symptoms are concerning for occipital neuralgia. - I am setting you up outpatient with Neurology for further workup of this and chronic CT findings.  - Please start the Gabapentin at 100 mg three times daily.  Want to make sure tolerate well first and then can adjust the dose further to get control of pain.

## 2020-08-07 DIAGNOSIS — D3500 Benign neoplasm of unspecified adrenal gland: Secondary | ICD-10-CM | POA: Diagnosis not present

## 2020-08-07 DIAGNOSIS — I700 Atherosclerosis of aorta: Secondary | ICD-10-CM | POA: Diagnosis not present

## 2020-08-07 DIAGNOSIS — D3502 Benign neoplasm of left adrenal gland: Secondary | ICD-10-CM | POA: Diagnosis not present

## 2020-08-07 DIAGNOSIS — K5730 Diverticulosis of large intestine without perforation or abscess without bleeding: Secondary | ICD-10-CM | POA: Diagnosis not present

## 2020-08-09 ENCOUNTER — Ambulatory Visit: Payer: Medicare HMO | Admitting: Neurology

## 2020-08-09 ENCOUNTER — Encounter: Payer: Self-pay | Admitting: Neurology

## 2020-08-09 VITALS — BP 149/79 | HR 68 | Ht 66.0 in | Wt 141.0 lb

## 2020-08-09 DIAGNOSIS — G8929 Other chronic pain: Secondary | ICD-10-CM | POA: Diagnosis not present

## 2020-08-09 DIAGNOSIS — F418 Other specified anxiety disorders: Secondary | ICD-10-CM | POA: Insufficient documentation

## 2020-08-09 DIAGNOSIS — R519 Headache, unspecified: Secondary | ICD-10-CM | POA: Diagnosis not present

## 2020-08-09 DIAGNOSIS — I679 Cerebrovascular disease, unspecified: Secondary | ICD-10-CM | POA: Diagnosis not present

## 2020-08-09 DIAGNOSIS — F32A Depression, unspecified: Secondary | ICD-10-CM

## 2020-08-09 MED ORDER — VENLAFAXINE HCL ER 37.5 MG PO CP24: 37.5000 mg | ORAL_CAPSULE | Freq: Every day | ORAL | 11 refills | Status: DC

## 2020-08-09 MED ORDER — BUTALBITAL-APAP-CAFFEINE 50-325-40 MG PO TABS: 1.0000 | ORAL_TABLET | Freq: Four times a day (QID) | ORAL | 5 refills | Status: DC | PRN

## 2020-08-09 NOTE — Progress Notes (Signed)
Chief Complaint  Patient presents with  . Headache    She has only been having headaches about once per month. However, she recently had a severe, three day headache that required a visit to the emergency room. Her PCP prescribed her gabapentin 153m, TID. She is only taking it twice daily.     HISTORICAL Megan HIEGELis a 69years old female,  seen in request by her primary care physician PA MBrunetta Jeans PUtahand ophthalmologist Dr. GWarden Fillersfor evaluation of headaches, initial evaluation was on April 07, 2018.  She has past medical history of depression anxiety, had intolerance to multiple different agents in the past, including amitriptyline, Effexor, but she denied previous history of headaches, early 2019, she began to have frequent left retro-orbital area pressure headaches, initially thought it was due to sinus issues, was evaluated by ENT Dr. WErik Obey reported normal CT sinus in 2019.  She had left cataract surgery by Dr. GKaty Fitchin April 2019, left vision overall recovered very well, but since her cataract surgery in April, left-sided headache has become worse, constant pressure, 5 out of 10 on a daily basis, often exacerbated to a more severe pounding headache with associated light noise sensitivity, nauseous, she tends to lying in bed for hours to help her headaches, she has tried over-the-counter ibuprofen and Tylenol with limited help,  We personally reviewed MRI of the brain in May 2017 for evaluation of acute onset right facial droop and numbness, there was no acute abnormality mild supratentorium small vessel disease,   ESR C-reactive protein were normal in April 2019.  UPDATE Aug 09 2020: She lost follow-up since 2019, for left-sided headache quit for about 2 years, began to have frequent headaches again few months ago, early December 2021, she had 3 days of headache on the left parietal region, on December 7, when she woke up, her headache has much  improved, but she felt weak, tired, confused, she brushed her teeth at her kitchen sink, eventually presented to the emergency room,  Personally reviewed CT head without contrast, lacunar infarction at the left head of caudate, which was also present at MRI of the brain in 2019, also supratentorium small vessel disease, she has been taking aspirin 81 mg daily  She complains stress at home, difficulty sleeping, stress, tearful,  Laboratory evaluations in December 2021, normal CMP, CBC, hemoglobin of 14.3.     REVIEW OF SYSTEMS: Full 14 system review of systems performed and notable only for as above All other review of systems were negative.  ALLERGIES: Allergies  Allergen Reactions  . Iodine-131 Other (See Comments)    Other reaction(s): Dizziness (intolerance)  . Sulfamethoxazole Shortness Of Breath    chest tightness  . Amitriptyline Anxiety and Other (See Comments)    Depression, insomnia  . Effexor [Venlafaxine]     palpitations  . Iohexol Other (See Comments)    Passed out  . Lactose Intolerance (Gi) Diarrhea and Nausea And Vomiting  . Lexapro [Escitalopram Oxalate]     nausea  . Iodinated Diagnostic Agents     Pt states at Dr.Grapeys office 15 years ago blacked out for 2 hours during injection for IVP. Pt says she had dye recently with no problems   . Prednisone Palpitations    HOME MEDICATIONS: Current Outpatient Medications  Medication Sig Dispense Refill  . albuterol (VENTOLIN HFA) 108 (90 Base) MCG/ACT inhaler Inhale 2 puffs into the lungs every 6 (six) hours as needed for wheezing or shortness of  breath. 8 g 0  . ALPRAZolam (XANAX) 1 MG tablet TAKE 1 TABLET BY MOUTH TWICE A DAY AS NEEDED NEEDS APPT FOR REFILLS 30 tablet 0  . aspirin EC 81 MG tablet Take 1 tablet (81 mg total) by mouth daily.    Marland Kitchen azelastine (ASTELIN) 0.1 % nasal spray Place 2 sprays into both nostrils 2 (two) times daily. Use in each nostril as directed 30 mL 3  . Biotin 1000 MCG tablet Take 1,000  mcg by mouth 3 (three) times daily.    . Cholecalciferol (VITAMIN D-3) 5000 units TABS Take 1 tablet by mouth daily. 30 tablet 0  . Cyanocobalamin (VITAMIN B-12 PO) Take 1 tablet by mouth daily.    Marland Kitchen diltiazem (CARTIA XT) 180 MG 24 hr capsule Take 1 capsule (180 mg total) by mouth daily. 90 capsule 3  . gabapentin (NEURONTIN) 100 MG capsule Take 1 capsule (100 mg total) by mouth 3 (three) times daily. 90 capsule 3  . linaclotide (LINZESS) 72 MCG capsule Take 72 mcg by mouth daily as needed. constipation    . metoprolol succinate (TOPROL XL) 25 MG 24 hr tablet Take 1 tablet (25 mg total) by mouth daily. 90 tablet 1  . Multiple Minerals (CALCIUM/MAGNESIUM/ZINC PO) Take 1 tablet by mouth daily.    Marland Kitchen omeprazole (PRILOSEC) 40 MG capsule     . ondansetron (ZOFRAN) 4 MG tablet TAKE 1 TABLET BY MOUTH EVERY 8 HOURS AS NEEDED FOR NAUSEA / VOMITING    . promethazine (PHENERGAN) 25 MG tablet TAKE 1 TABLET BY MOUTH EVERY 6 HOURS AS NEEDED NAUSEA    . sucralfate (CARAFATE) 1 g tablet Take 1 g by mouth 4 (four) times daily -  with meals and at bedtime.    Marland Kitchen tiZANidine (ZANAFLEX) 4 MG tablet Take 1 tablet (4 mg total) by mouth at bedtime. 30 tablet 0  . vitamin E 400 UNIT capsule Take 400 Units by mouth daily.     No current facility-administered medications for this visit.    PAST MEDICAL HISTORY: Past Medical History:  Diagnosis Date  . 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 Allergic state 12/31/2012  . Anxiety   . Asthma   . Barrett esophagus   . Blind loop syndrome   . C. difficile diarrhea   . Cancer ([place])   . Chest pain, atypical 07/14/2011  . Cold sore 07/17/2016  . Colon polyp   . Contact dermatitis 03/23/2012  . Cystocele, midline   . Depression   . Diarrhea   . Diverticulosis   . Endometrial hyperplasia 02/27/2006   BENIGN ENDO BX ON 02/2007  . GERD (gastroesophageal reflux disease)   . Headache 04/16/2015  . Headache   . Headache(784.0) 04/15/2012  .  Hematuria 04/27/2012  . Hepatic cyst   . Hiatal hernia   . Hiatal hernia   . Hyperlipidemia, mixed 12/17/2014  . Hypertension   . IBS (irritable bowel syndrome)   . Insomnia   . Leaky heart valve   . Low back pain 08/15/2013  . Osteoporosis 03/2017   T score -2.5. Without significant loss from prior studies  . 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 Paronychia of great toe, left 06/19/2013  . Preventative health care 09/11/2014  . Rectal bleeding 10/10/2011  . Rectocele   . 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 Sinusitis acute 02/03/2012  . Thrush 07/21/2011  . 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 Unspecified hypothyroidism   . Unspecified menopausal and postmenopausal disorder   .  Uterine prolapse without mention of vaginal wall prolapse   . Vitamin B12 deficiency   . Vitamin D deficiency 07/17/2016    PAST SURGICAL HISTORY: Past Surgical History:  Procedure Laterality Date  . APPENDECTOMY    . endoscopic hemoclip    . eye surgery Left    lens implant  . HYSTEROSCOPY     POLYP  . OPEN REDUCTION INTERNAL FIXATION (ORIF) DISTAL RADIAL FRACTURE Left 01/17/2019   Procedure: LEFT OPEN REDUCTION INTERNAL FIXATION (ORIF) DISTAL RADIUS FRACTURE;  Surgeon: Daryll Brod, MD;  Location: [facility];  Service: Orthopedics;  Laterality: Left;  AXILLARY BLOCK  . PERIPHERAL VASCULAR CATHETERIZATION Left 07/24/2016   Procedure: Renal Angiography;  Surgeon: Conrad Lisbon, MD;  Location: [facility];  Service: Cardiovascular;  Laterality: Left;  . UPPER GI ENDOSCOPY    . UTERINE FIBROID SURGERY      FAMILY HISTORY: Family History  Problem Relation Age of Onset  . Colon cancer Mother   . Diabetes Mother   . Hypertension Mother   . Colon polyps Father   . Parkinson's disease Father   . Colon cancer Maternal Grandmother   . Breast cancer Maternal Grandmother 51  . Diabetes Paternal Grandmother   . Breast cancer Paternal Grandmother 54  . Allergies Sister   . Arthritis Brother         b/l hip replace  . Alcohol abuse Daughter   . Arthritis Son   . Hypertension Son   . Allergies Son   . Osteoporosis Son   . Osteoporosis Son   . Arthritis Son        back disease, DDD  . Allergies Son   . Arthritis Son   . Irritable bowel syndrome Son        RSD  . Hypertension Paternal Grandfather   . Heart disease Paternal Grandfather   . Aneurysm Paternal Grandfather   . Colon cancer Maternal Aunt   . Colon cancer Cousin        X3  . Ovarian cancer Cousin     SOCIAL HISTORY: Social History   Socioeconomic History  . Marital status: Married    Spouse name: Not on file  . Number of children: 4  . Years of education: 54  . Highest education level: High school graduate  Occupational History  . Occupation: Retired  Tobacco Use  . Smoking status: Former Smoker    Quit date: 08/19/1995    Years since quitting: 24.9  . Smokeless tobacco: Never Used  Vaping Use  . Vaping Use: Never used  Substance and Sexual Activity  . Alcohol use: No    Alcohol/week: 0.0 standard drinks  . Drug use: No    Types: Hydrocodone  . Sexual activity: Not Currently    Birth control/protection: Post-menopausal    Comment: 1st intercourse 89 yo-5 partners  Other Topics Concern  . Not on file  Social History Narrative   Lives at home with her husband.   Right-handed.   2 cups caffeine per day.   Social Determinants of Health   Financial Resource Strain: Not on file  Food Insecurity: Not on file  Transportation Needs: Not on file  Physical Activity: Not on file  Stress: Not on file  Social Connections: Not on file  Intimate Partner Violence: Not on file     PHYSICAL EXAM   Vitals:   08/09/20 0736  BP: (!) 149/79  Pulse: 68  Weight: 141 lb (64 kg)  Height: _0  (  1.676 m)   Not recorded     Body mass index is 22.76 kg/m.  PHYSICAL EXAMNIATION:  Gen: NAD, conversant, well nourised, well groomed                     Cardiovascular: Regular rate rhythm, no peripheral  edema, warm, nontender. Eyes: Conjunctivae clear without exudates or hemorrhage Neck: Supple, no carotid bruits. Pulmonary: Clear to auscultation bilaterally   NEUROLOGICAL EXAM:  MENTAL STATUS: Speech:    Speech is normal; fluent and spontaneous with normal comprehension.  Cognition:     Orientation to time, place and person     Normal recent and remote memory     Normal Attention span and concentration     Normal Language, naming, repeating,spontaneous speech     Fund of knowledge   CRANIAL NERVES: CN II: Visual fields are full to confrontation. Pupils are round equal and briskly reactive to light. CN III, IV, VI: extraocular movement are normal. No ptosis. CN V: Facial sensation is intact to light touch CN VII: Face is symmetric with normal eye closure  CN VIII: Hearing is normal to causal conversation. CN IX, X: Phonation is normal. CN XI: Head turning and shoulder shrug are intact  MOTOR: There is no pronator drift of out-stretched arms. Muscle bulk and tone are normal. Muscle strength is normal.  REFLEXES: Reflexes are 2+ and symmetric at the biceps, triceps, knees, and ankles. Plantar responses are flexor.  SENSORY: Intact to light touch, pinprick and vibratory sensation are intact in fingers and toes.  COORDINATION: There is no trunk or limb dysmetria noted.  GAIT/STANCE: Posture is normal. Gait is steady with normal steps, base, arm swing, and turning. Heel and toe walking are normal. Tandem gait is normal.  Romberg is absent.   DIAGNOSTIC DATA (LABS, IMAGING, TESTING) - I reviewed patient records, labs, notes, testing and imaging myself where available.   ASSESSMENT AND PLAN  Megan [name] is a 69 y.o. female   Left-sided headaches Lacunar infarction Chronic depression  ESR C-reactive protein to rule out temporal arteritis  Headache has some migraine features, starting Effexor XR 37.5 mg, along with gabapentin 100 mg 3 times daily as migraine  prevention,  Fioricet as needed  Continue aspirin 81 mg daily  Marcial Pacas, M.D. Ph.D.  [facility] [address] [place], [place] [address] Ph: [phone] Fax: [phone]  CC:  Brunetta Jeans, PA-C [address],  [place] [id number]  Brunetta Jeans, PA-C

## 2020-08-12 LAB — C-REACTIVE PROTEIN: CRP: 2 mg/L (ref 0–10)

## 2020-08-12 LAB — ANA W/REFLEX IF POSITIVE: Anti Nuclear Antibody (ANA): NEGATIVE

## 2020-08-12 LAB — SEDIMENTATION RATE: Sed Rate: 2 mm/hr (ref 0–40)

## 2020-08-14 DIAGNOSIS — N819 Female genital prolapse, unspecified: Secondary | ICD-10-CM | POA: Diagnosis not present

## 2020-08-14 DIAGNOSIS — R350 Frequency of micturition: Secondary | ICD-10-CM | POA: Diagnosis not present

## 2020-08-15 ENCOUNTER — Telehealth: Payer: Self-pay | Admitting: *Deleted

## 2020-08-15 NOTE — Progress Notes (Signed)
Kindly inform patient that lab work for sedimentation rate, CRP and blood test for lupus were all normal

## 2020-08-15 NOTE — Telephone Encounter (Signed)
Left message letting her know the lab work is normal. Provided our number to call back with any questions.

## 2020-08-15 NOTE — Telephone Encounter (Signed)
-----   Message from Micki Riley, MD sent at 08/15/2020  4:38 PM EST ----- Kindly inform patient that lab work for sedimentation rate, CRP and blood test for lupus were all normal

## 2020-08-16 ENCOUNTER — Telehealth: Payer: Self-pay | Admitting: Neurology

## 2020-08-16 NOTE — Telephone Encounter (Signed)
Pt called stating that she will not be able to take the venlafaxine XR (EFFEXOR XR) 37.5 MG 24 hr capsule that was prescribed to her due to her BP going up on it. Pt states that she would like to try something else in its place.

## 2020-08-16 NOTE — Telephone Encounter (Signed)
I returned the call to her patient. States her typical systolic BP runs in the 120's. After taking venlafaxine, says her systolic BP was in the 160's. She has stopped the medication. This adverse side effect has been noted in her allergy list.  She has a prescription for gabapentin 100mg , one cap TID. When she came in for her appt on 08/09/20, she was only taking it BID. She was instructed to start TID dosing.  She had a significant left-sided headache last night that even caused her head to be sore to the touch. It self-resolved.  She has Fioricet for pain and we reviewed that prescription.  She will start taking her gabapentin 100mg , one cap TID consistently to see if it will help her headaches. She would like to try this prior to adding another medication.  She did report years of depression. We discussed the importance of having this addressed with her PCP. She was agreeable to make an appointment.

## 2020-08-23 ENCOUNTER — Other Ambulatory Visit: Payer: Self-pay | Admitting: Physician Assistant

## 2020-08-23 DIAGNOSIS — F341 Dysthymic disorder: Secondary | ICD-10-CM

## 2020-08-23 NOTE — Telephone Encounter (Signed)
Xanax last rx 07/25/20 #30 LOV: 08/01/20 Neuralgia, 05/25/20 Anxiety/Depression

## 2020-08-27 ENCOUNTER — Encounter: Payer: Self-pay | Admitting: Physician Assistant

## 2020-08-27 NOTE — Telephone Encounter (Signed)
We have tried multiple medications in the past, with her having taken even more before coming into my care. She seems to have a significant sensitivity to these medications. Would recommend referral to Psychiatry for management or we can schedule a video visit to discuss potential of Gene Site testing.

## 2020-09-05 ENCOUNTER — Encounter: Payer: Self-pay | Admitting: Physician Assistant

## 2020-09-07 ENCOUNTER — Ambulatory Visit: Payer: Medicare HMO

## 2020-09-07 ENCOUNTER — Other Ambulatory Visit: Payer: Self-pay

## 2020-09-07 DIAGNOSIS — F341 Dysthymic disorder: Secondary | ICD-10-CM | POA: Diagnosis not present

## 2020-09-10 ENCOUNTER — Encounter: Payer: Self-pay | Admitting: Physician Assistant

## 2020-09-10 ENCOUNTER — Telehealth: Payer: Self-pay

## 2020-09-10 NOTE — Telephone Encounter (Signed)
Caryl Pina from gene site received a sample from patient on 1/21 and are needing more info can we please call back and provide additional information [phone] Caryl Pina  Thank you

## 2020-09-11 NOTE — Telephone Encounter (Signed)
Spoke with Caryl Pina and corrected the order for patient. Order completed. Corrected

## 2020-09-12 ENCOUNTER — Institutional Professional Consult (permissible substitution): Payer: Medicare HMO | Admitting: Neurology

## 2020-09-13 ENCOUNTER — Telehealth: Payer: Self-pay | Admitting: Physician Assistant

## 2020-09-13 NOTE — Telephone Encounter (Signed)
Can we please call patient to get her scheduled for tomorrow morning -- can be a video visit.  Her GeneSight results are in and I want to review these with her and get her started on treatment.

## 2020-09-13 NOTE — Telephone Encounter (Signed)
Pt has been scheduled for an appt

## 2020-09-14 ENCOUNTER — Other Ambulatory Visit: Payer: Self-pay

## 2020-09-14 ENCOUNTER — Telehealth (INDEPENDENT_AMBULATORY_CARE_PROVIDER_SITE_OTHER): Payer: Medicare HMO | Admitting: Physician Assistant

## 2020-09-14 ENCOUNTER — Encounter: Payer: Self-pay | Admitting: Physician Assistant

## 2020-09-14 DIAGNOSIS — G44019 Episodic cluster headache, not intractable: Secondary | ICD-10-CM | POA: Insufficient documentation

## 2020-09-14 DIAGNOSIS — H9202 Otalgia, left ear: Secondary | ICD-10-CM | POA: Diagnosis not present

## 2020-09-14 DIAGNOSIS — H903 Sensorineural hearing loss, bilateral: Secondary | ICD-10-CM | POA: Diagnosis not present

## 2020-09-14 DIAGNOSIS — J3489 Other specified disorders of nose and nasal sinuses: Secondary | ICD-10-CM | POA: Diagnosis not present

## 2020-09-14 DIAGNOSIS — H6121 Impacted cerumen, right ear: Secondary | ICD-10-CM | POA: Diagnosis not present

## 2020-09-14 DIAGNOSIS — J449 Chronic obstructive pulmonary disease, unspecified: Secondary | ICD-10-CM | POA: Diagnosis not present

## 2020-09-14 DIAGNOSIS — F331 Major depressive disorder, recurrent, moderate: Secondary | ICD-10-CM

## 2020-09-14 MED ORDER — LEVOMILNACIPRAN HCL ER 20 MG PO CP24: 1.0000 | ORAL_CAPSULE | Freq: Every day | ORAL | 1 refills | Status: DC

## 2020-09-14 NOTE — Progress Notes (Signed)
Virtual Visit via Video   I connected with patient on 09/14/20 at 11:00 AM EST by a video enabled telemedicine application and verified that I am speaking with the correct person using two identifiers.  Location patient: Home Location provider: [facility], Office Persons participating in the virtual visit: Patient, Provider, [place] (Patina Moore)  I discussed the limitations of evaluation and management by telemedicine and the availability of in person appointments. The patient expressed understanding and agreed to proceed.  Subjective:   HPI:   Patient presents via [facility] today to review GeneSight test results and to discuss next steps for the treatment of her moderate major depressive disorder.  Patient has been on numerous medications in the past was subtherapeutic affect or severe side effect.  At last visit it was determined to start her GeneSight test.  Patient still noting daily depressed mood and anhedonia.  Denies suicidal thought or ideation.  Takes Xanax up to twice daily for episodes of more acute anxiety..  ROS:   See pertinent positives and negatives per HPI.  Patient Active Problem List   Diagnosis Date Noted  . Chronic left-sided headache 08/09/2020  . Cerebral vascular disease 08/09/2020  . Depression 08/09/2020  . 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 Closed fracture of right distal radius 01/12/2019  . Moderate obstructive sleep apnea 05/21/2017  . Nausea 04/12/2017  . Vitamin D deficiency 07/17/2016  . Abdominal aortic atherosclerosis ([place]) 07/16/2016  . Renal artery stenosis ([place]) 06/12/2016  . Chronic low back pain 08/24/2015  . Hyperlipidemia, mixed 12/17/2014  . Preventative health care 09/11/2014  . Hepatic cyst 05/22/2014  . Insomnia 03/10/2014  . Low back pain 08/15/2013  . 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 Osteoporosis 09/07/2012  . 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 Mitral regurgitation  07/21/2011  . Gastroesophageal reflux disease with hiatal hernia 06/17/2011  . Endometrial hyperplasia   . Diverticulosis   . IBS (irritable bowel syndrome) 04/24/2011  . Barrett's esophagus 03/04/2011  . Hiatal hernia 03/04/2011  . BASAL CELL CARCINOMA, FACE 11/15/2009  . Hyperglycemia 06/05/2009  . Vitamin B12 deficiency 02/15/2009  . 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 Essential hypertension 01/04/2009  . ANXIETY DEPRESSION 12/14/2007    Social History   Tobacco Use  . Smoking status: Former Smoker    Quit date: 08/19/1995    Years since quitting: 25.0  . Smokeless tobacco: Never Used  Substance Use Topics  . Alcohol use: No    Alcohol/week: 0.0 standard drinks    Current Outpatient Medications:  .  albuterol (VENTOLIN HFA) 108 (90 Base) MCG/ACT inhaler, Inhale 2 puffs into the lungs every 6 (six) hours as needed for wheezing or shortness of breath., Disp: 8 g, Rfl: 0 .  ALPRAZolam (XANAX) 1 MG tablet, TAKE 1 TABLET BY MOUTH TWICE A DAY AS NEEDED NEEDS APPT FOR REFILLS, Disp: 30 tablet, Rfl: 0 .  aspirin EC 81 MG tablet, Take 1 tablet (81 mg total) by mouth daily., Disp: , Rfl:  .  azelastine (ASTELIN) 0.1 % nasal spray, Place 2 sprays into both nostrils 2 (two) times daily. Use in each nostril as directed, Disp: 30 mL, Rfl: 3 .  Biotin 1000 MCG tablet, Take 1,000 mcg by mouth 3 (three) times daily., Disp: , Rfl:  .  butalbital-acetaminophen-caffeine (FIORICET) 50-325-40 MG tablet, Take 1 tablet by mouth every 6 (six) hours as needed for headache., Disp: 12 tablet, Rfl: 5 .  Cholecalciferol (VITAMIN D-3) 5000 units TABS, Take 1  tablet by mouth daily., Disp: 30 tablet, Rfl: 0 .  Cyanocobalamin (VITAMIN B-12 PO), Take 1 tablet by mouth daily., Disp: , Rfl:  .  diltiazem (CARTIA XT) 180 MG 24 hr capsule, Take 1 capsule (180 mg total) by mouth daily., Disp: 90 capsule, Rfl: 3 .  gabapentin (NEURONTIN) 100 MG capsule, Take 1 capsule (100 mg total) by mouth 3 (three) times daily., Disp:  90 capsule, Rfl: 3 .  linaclotide (LINZESS) 72 MCG capsule, Take 72 mcg by mouth daily as needed. constipation, Disp: , Rfl:  .  metoprolol succinate (TOPROL XL) 25 MG 24 hr tablet, Take 1 tablet (25 mg total) by mouth daily., Disp: 90 tablet, Rfl: 1 .  Multiple Minerals (CALCIUM/MAGNESIUM/ZINC PO), Take 1 tablet by mouth daily., Disp: , Rfl:  .  omeprazole (PRILOSEC) 40 MG capsule, , Disp: , Rfl:  .  ondansetron (ZOFRAN) 4 MG tablet, TAKE 1 TABLET BY MOUTH EVERY 8 HOURS AS NEEDED FOR NAUSEA / VOMITING, Disp: , Rfl:  .  promethazine (PHENERGAN) 25 MG tablet, TAKE 1 TABLET BY MOUTH EVERY 6 HOURS AS NEEDED NAUSEA, Disp: , Rfl:  .  sucralfate (CARAFATE) 1 g tablet, Take 1 g by mouth 4 (four) times daily -  with meals and at bedtime., Disp: , Rfl:  .  tiZANidine (ZANAFLEX) 4 MG tablet, Take 1 tablet (4 mg total) by mouth at bedtime., Disp: 30 tablet, Rfl: 0 .  vitamin E 400 UNIT capsule, Take 400 Units by mouth daily., Disp: , Rfl:   Allergies  Allergen Reactions  . Iodine-131 Other (See Comments)    Other reaction(s): Dizziness (intolerance)  . Sulfamethoxazole Shortness Of Breath    chest tightness  . Amitriptyline Anxiety and Other (See Comments)    Depression, insomnia  . Effexor [Venlafaxine]     Palpitations, hypertension  . Iohexol Other (See Comments)    Passed out  . Lactose Intolerance (Gi) Diarrhea and Nausea And Vomiting  . Lexapro [Escitalopram Oxalate]     nausea  . Prednisone Palpitations    Objective:   There were no vitals taken for this visit.  Patient is well-developed, well-nourished in no acute distress.  Resting comfortably at home.  Head is normocephalic, atraumatic.  No labored breathing.  Speech is clear and coherent with logical content.  Patient is alert and oriented at baseline.   Assessment and Plan:   1. Moderate episode of recurrent major depressive disorder ([place]) Reviewed GeneSight test results with patient.  Her three option in the near  interactions category are Terie Purser, [facility] and [facility].  Seems that all three require prior authorization.  We will proceed with prior authorization for Fetzima, starting at 20 mg daily.  Patient to continue alprazolam as directed.  Close follow-up with new PCP discussed.  Patient voiced understanding and agreement with plan.    Leeanne Rio, [place] 09/14/2020

## 2020-09-14 NOTE — Progress Notes (Signed)
I have discussed the procedure for the virtual visit with the patient who has given consent to proceed with assessment and treatment.   Damion Kant S Irma Delancey, CMA     

## 2020-09-15 ENCOUNTER — Other Ambulatory Visit: Payer: Self-pay | Admitting: Physician Assistant

## 2020-09-16 MED ORDER — DESVENLAFAXINE SUCCINATE ER 25 MG PO TB24: 1.0000 | ORAL_TABLET | Freq: Every day | ORAL | 1 refills | Status: DC

## 2020-09-24 ENCOUNTER — Other Ambulatory Visit: Payer: Self-pay | Admitting: Urology

## 2020-09-25 ENCOUNTER — Other Ambulatory Visit: Payer: Self-pay | Admitting: Physician Assistant

## 2020-09-25 DIAGNOSIS — F341 Dysthymic disorder: Secondary | ICD-10-CM

## 2020-09-25 NOTE — Telephone Encounter (Signed)
Xanax last rx 08/24/20 #30 LOV: 09/14/20 Depression CSC: 01/14/18

## 2020-10-03 ENCOUNTER — Other Ambulatory Visit: Payer: Self-pay | Admitting: Cardiovascular Disease

## 2020-10-05 ENCOUNTER — Ambulatory Visit: Admission: RE | Admit: 2020-10-05 | Payer: Medicare HMO | Source: Ambulatory Visit

## 2020-10-05 ENCOUNTER — Other Ambulatory Visit: Payer: Self-pay

## 2020-10-08 ENCOUNTER — Other Ambulatory Visit: Payer: Self-pay | Admitting: Physician Assistant

## 2020-10-08 DIAGNOSIS — R599 Enlarged lymph nodes, unspecified: Secondary | ICD-10-CM

## 2020-10-09 ENCOUNTER — Other Ambulatory Visit: Payer: Self-pay | Admitting: Physician Assistant

## 2020-10-23 DIAGNOSIS — N819 Female genital prolapse, unspecified: Secondary | ICD-10-CM | POA: Diagnosis not present

## 2020-10-23 DIAGNOSIS — R8271 Bacteriuria: Secondary | ICD-10-CM | POA: Diagnosis not present

## 2020-10-24 ENCOUNTER — Other Ambulatory Visit: Payer: Self-pay | Admitting: Physician Assistant

## 2020-10-24 DIAGNOSIS — F341 Dysthymic disorder: Secondary | ICD-10-CM

## 2020-10-26 ENCOUNTER — Other Ambulatory Visit: Payer: Medicare HMO

## 2020-10-29 ENCOUNTER — Telehealth: Payer: Self-pay

## 2020-10-29 NOTE — Telephone Encounter (Signed)
Called and spoke with about her Rx Xanax. Patient stated that she has an appt scheduled with Dr.Rudd on 3/21. She is needing her Xanax 1mg  30 tabs 0 refills

## 2020-10-29 NOTE — Telephone Encounter (Signed)
Requesting:Xanax 1mg  Contract: UDS: Last Visit:09/14/20 v/v Next Visit:11/05/20 Dr. Gena Fray Last Refill:09/25/20 30 tabs 0 refills Please Advise

## 2020-10-29 NOTE — Telephone Encounter (Signed)
Patient called back and needs this rx refilled.

## 2020-10-30 NOTE — Telephone Encounter (Signed)
Pt.notified

## 2020-10-30 NOTE — Telephone Encounter (Signed)
Patient called back and was very upset.  She has been out of her xanax since 10/23/2020.  She first requested on 10/24/2020. - Please advise

## 2020-10-30 NOTE — Telephone Encounter (Signed)
Prescription sent for #30.

## 2020-11-02 ENCOUNTER — Encounter: Payer: Medicare HMO | Admitting: Family Medicine

## 2020-11-05 ENCOUNTER — Encounter: Payer: Self-pay | Admitting: Family Medicine

## 2020-11-05 ENCOUNTER — Other Ambulatory Visit: Payer: Self-pay

## 2020-11-05 ENCOUNTER — Ambulatory Visit (INDEPENDENT_AMBULATORY_CARE_PROVIDER_SITE_OTHER): Payer: Medicare HMO | Admitting: Family Medicine

## 2020-11-05 VITALS — BP 140/70 | HR 68 | Temp 97.0°F | Ht 66.0 in | Wt 140.8 lb

## 2020-11-05 DIAGNOSIS — K581 Irritable bowel syndrome with constipation: Secondary | ICD-10-CM

## 2020-11-05 DIAGNOSIS — E782 Mixed hyperlipidemia: Secondary | ICD-10-CM | POA: Diagnosis not present

## 2020-11-05 DIAGNOSIS — I10 Essential (primary) hypertension: Secondary | ICD-10-CM

## 2020-11-05 DIAGNOSIS — I679 Cerebrovascular disease, unspecified: Secondary | ICD-10-CM | POA: Diagnosis not present

## 2020-11-05 DIAGNOSIS — N814 Uterovaginal prolapse, unspecified: Secondary | ICD-10-CM | POA: Insufficient documentation

## 2020-11-05 DIAGNOSIS — K219 Gastro-esophageal reflux disease without esophagitis: Secondary | ICD-10-CM | POA: Diagnosis not present

## 2020-11-05 DIAGNOSIS — R739 Hyperglycemia, unspecified: Secondary | ICD-10-CM

## 2020-11-05 DIAGNOSIS — R109 Unspecified abdominal pain: Secondary | ICD-10-CM | POA: Diagnosis not present

## 2020-11-05 DIAGNOSIS — F418 Other specified anxiety disorders: Secondary | ICD-10-CM | POA: Diagnosis not present

## 2020-11-05 DIAGNOSIS — K449 Diaphragmatic hernia without obstruction or gangrene: Secondary | ICD-10-CM

## 2020-11-05 DIAGNOSIS — J45909 Unspecified asthma, uncomplicated: Secondary | ICD-10-CM | POA: Insufficient documentation

## 2020-11-05 LAB — LIPID PANEL
Cholesterol: 235 mg/dL — ABNORMAL HIGH (ref 0–200)
HDL: 57.9 mg/dL (ref >39.00)
LDL Cholesterol: 154 mg/dL — ABNORMAL HIGH (ref 0–99)
NonHDL: 176.72
Total CHOL/HDL Ratio: 4
Triglycerides: 115 mg/dL (ref 0.0–149.0)
VLDL: 23 mg/dL (ref 0.0–40.0)

## 2020-11-05 LAB — HEMOGLOBIN A1C: Hgb A1c MFr Bld: 5.6 % (ref 4.6–6.5)

## 2020-11-05 MED ORDER — ATORVASTATIN CALCIUM 20 MG PO TABS: 20.0000 mg | ORAL_TABLET | Freq: Every day | ORAL | 3 refills | Status: DC

## 2020-11-05 NOTE — Progress Notes (Signed)
[facility] [address] [place] [address] Dept: [phone] Dept Fax: [phone]  New Patient Office Visit  Subjective:    Patient ID: Megan [name], female    DOB: [birth year], 70 y.o..   MRN: [id number]  Chief Complaint  Patient presents with  . Establish Care    Patient here foe TOC from [facility].  Pt would like to discuss surgery clearance and med refills.  Pt c/o lt side hip/back pain x 68month.    History of Present Illness:  Patient is in today to establish care. She is a former patient of Megan [name], [place]. Megan [name] grew up in [place], [place]. She currently lives in [place] area. She is married. She has 4 children and many grandchildren/great grandchildren. 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 She is a former smoker (quit in 1992) and denies use of alcohol or drugs.  Megan [name] has a history of hypertension. She also has been seen to have mild-moderate tricuspid regurgitation on echocardiography (her last was in  11/2019). Her EF has been normal and there is no sign of cardiac enlargement or heart failure. She is managed on diltiazem and metoprolol. She also takes a daily aspirin. She has been noted to have incidental aortic atherosclerosis and hyperlipidemia. She was intolerant of Crestor in the past.  Megan [name] has a number of GI complaints and sensitivities. She has a history of GERD (on Prilosec and sucralfate) with prior Barrett's esophagus, recurrent nausea (for which she uses phenergan or ondansetron), irritable bowel syndrome primarily with constipation (on Linzess), diverticulosis, and prior pancreatic and hepatic cysts. Her last EGD and colonoscopy were in 06/2020. She is followed by gastroenterology for these issues.  Megan [name] Has a history of low back pain and sciatica. Work up fro sacroiliac disease this past fall did not show  any underlying rheumatic disease. She uses Zanaflex intermittently for her back issues.  Megan [name] has a history of chronic left-sided headaches, which apparently are related to a prior stroke. She is on daily aspirin. Shet takes gabapentin to manage the chronic headache pain and follows with neurology.  Megan [name] has a history of anxiety and depression. She notes she has been intolerant of many prior antidepressants. She currently uses Xanax for the anxiety component, taking 1 every 1-2 days.  Megan [name] is on multiple supplements, including B12, Vitamin D, biotin,a nd Vitamin E.  Megan [name] brings a letter from her urologist (Megan [name], [facility]) indicating she is scheduled for a robotic hysterectomy, bilateral salpingo-oophorectomy and sacrocolpopexy on November 15, 2020. She notes that she has uterine prolapse with urination. She also has frequent nocturia and notes her urine sprays widely when she urinates. Megan [name] notes a 1 month history of left lower quadrant pain radiating to the left flank. She denies dysuriua. She isn't sure if this is related to her chronic bowel issues or  some new issue.  Past Medical History: Patient Active Problem List   Diagnosis Date Noted  . Asthma 11/05/2020  . Uterine prolapse 11/05/2020  . Episodic cluster headache, not intractable 09/14/2020  . Chronic left-sided headache 08/09/2020  . Cerebral vascular disease 08/09/2020  . Depression with anxiety 08/09/2020  . 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 Closed fracture of right distal radius 01/12/2019  . Moderate obstructive sleep apnea 05/21/2017  . Vitamin D deficiency 07/17/2016  . Abdominal aortic atherosclerosis ([place]) 07/16/2016  . Renal artery  stenosis ([place]) 06/12/2016  . Chronic low back pain 08/24/2015  . Hyperlipidemia, mixed 12/17/2014  . Hepatic cyst 05/22/2014  . Insomnia 03/10/2014  . 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 Osteoporosis 09/07/2012  .  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 Tricuspid regurgitation, Mild-Moderate 07/21/2011  . Mitral regurgitation, Trivial 07/21/2011  . Gastroesophageal reflux disease with hiatal hernia 06/17/2011  . Endometrial hyperplasia   . Diverticulosis   . IBS (irritable bowel syndrome) with constipation 04/24/2011  . Barrett's esophagus 03/04/2011  . Hiatal hernia 03/04/2011  . Basal cell carcinoma (BCC) of face 11/15/2009  . Hyperglycemia 06/05/2009  . Vitamin B12 deficiency 02/15/2009  . 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 Essential hypertension 01/04/2009   Past Surgical History:  Procedure Laterality Date  . APPENDECTOMY    . endoscopic hemoclip    . eye surgery Left    lens implant  . HYSTEROSCOPY     POLYP  . OPEN REDUCTION INTERNAL FIXATION (ORIF) DISTAL RADIAL FRACTURE Left 01/17/2019   Procedure: LEFT OPEN REDUCTION INTERNAL FIXATION (ORIF) DISTAL RADIUS FRACTURE;  Surgeon: Daryll Brod, MD;  Location: [facility];  Service: Orthopedics;  Laterality: Left;  AXILLARY BLOCK  . PERIPHERAL VASCULAR CATHETERIZATION Left 07/24/2016   Procedure: Renal Angiography;  Surgeon: Conrad [place], MD;  Location: [facility];  Service: Cardiovascular;  Laterality: Left;  . UPPER GI ENDOSCOPY    . UTERINE FIBROID SURGERY     Family History  Problem Relation Age of Onset  . Colon cancer Mother   . Diabetes Mother   . Hypertension Mother   . Colon polyps Father   . Parkinson's disease Father   . Colon cancer Maternal Grandmother   . Breast cancer Maternal Grandmother 66  . Diabetes Paternal Grandmother   . Breast cancer Paternal Grandmother 90  . Allergies Sister   . Arthritis Brother        b/l hip replace  . Alcohol abuse Daughter   . Arthritis Son   . Hypertension Son   . Allergies Son   . Osteoporosis Son   . Osteoporosis Son   . Arthritis Son        back disease, DDD  . Allergies Son   . Arthritis Son   . Irritable bowel syndrome Son        RSD  . Hypertension  Paternal Grandfather   . Heart disease Paternal Grandfather   . Aneurysm Paternal Grandfather   . Colon cancer Maternal Aunt   . Colon cancer Cousin        X3  . Ovarian cancer Cousin     Outpatient Medications Prior to Visit  Medication Sig Dispense Refill  . albuterol (VENTOLIN HFA) 108 (90 Base) MCG/ACT inhaler Inhale 2 puffs into the lungs every 6 (six) hours as needed for wheezing or shortness of breath. 8 g 0  . ALPRAZolam (XANAX) 1 MG tablet TAKE 1 TABLET BY MOUTH TWICE A DAY AS NEEDED 30 tablet 0  . aspirin EC 81 MG tablet Take 1 tablet (81 mg total) by mouth daily.    Marland Kitchen azelastine (ASTELIN) 0.1 % nasal spray Place 2 sprays into both nostrils 2 (two) times daily. Use in each nostril as directed (Patient taking differently: Place 2 sprays into both nostrils 2 (two) times daily as needed for rhinitis or allergies. Use in each nostril as directed) 30 mL 3  . Biotin 1000 MCG tablet Take 1,000 mcg by mouth daily.    . Cholecalciferol (VITAMIN D-3) 5000 units TABS  Take 1 tablet by mouth daily. (Patient taking differently: Take 5,000 Units by mouth daily.) 30 tablet 0  . Cyanocobalamin (VITAMIN B-12 PO) Take 1 tablet by mouth daily.    Marland Kitchen diltiazem (CARTIA XT) 180 MG 24 hr capsule Take 1 capsule (180 mg total) by mouth daily. 90 capsule 3  . gabapentin (NEURONTIN) 100 MG capsule Take 1 capsule (100 mg total) by mouth 3 (three) times daily. 90 capsule 3  . linaclotide (LINZESS) 72 MCG capsule Take 72 mcg by mouth daily as needed. constipation    . metoprolol succinate (TOPROL-XL) 25 MG 24 hr tablet Take 1 tablet (25 mg total) by mouth daily. Please make yearly appt with Dr. Burt Knack for April 2022 for future refills. Thank you 1st attempt 90 tablet 0  . omeprazole (PRILOSEC) 40 MG capsule Take 40 mg by mouth in the morning and at bedtime.    . ondansetron (ZOFRAN) 4 MG tablet Take 4 mg by mouth every 8 (eight) hours as needed for nausea or vomiting.    . promethazine (PHENERGAN) 25 MG tablet  Take 25 mg by mouth every 8 (eight) hours as needed for nausea or vomiting.    . sucralfate (CARAFATE) 1 g tablet Take 1 g by mouth daily as needed (upset stomach).    Marland Kitchen tiZANidine (ZANAFLEX) 4 MG tablet Take 1 tablet (4 mg total) by mouth at bedtime. 30 tablet 0  . vitamin E 400 UNIT capsule Take 400 Units by mouth daily.    . butalbital-acetaminophen-caffeine (FIORICET) 50-325-40 MG tablet Take 1 tablet by mouth every 6 (six) hours as needed for headache. (Patient not taking: No sig reported) 12 tablet 5  . Desvenlafaxine Succinate ER 25 MG TB24 TAKE 1 TABLET BY MOUTH EVERY DAY (Patient not taking: No sig reported) 90 tablet 0   No facility-administered medications prior to visit.   Allergies  Allergen Reactions  . Sulfamethoxazole Shortness Of Breath    chest tightness  . Amitriptyline Anxiety and Other (See Comments)    Depression, insomnia  . Effexor [Venlafaxine]     Palpitations, hypertension  . Iohexol Other (See Comments)    Passed out  . Lactose Intolerance (Gi) Diarrhea and Nausea And Vomiting  . Lexapro [Escitalopram Oxalate]     nausea  . Prednisone Palpitations   Objective:   Today's Vitals   11/05/20 0822  BP: 140/70  Pulse: 68  Temp: (!) 97 F (36.1 C)  TempSrc: Temporal  SpO2: 99%  Weight: 140 lb 12.8 oz (63.9 kg)  Height: 5\' 6"  (1.676 m)   Body mass index is 22.73 kg/m.   General: Well developed, well nourished. No acute distress. HEENT: Normocephalic, non-traumatic. PERRL, EOMI. Conjunctiva clear. Fundiscopic exam shows normal disc, mild A-V nicking.   External ears normal. EAC and TMs normal bilaterally. Nose clear without congestion or rhinorrhea. Mucous membranes moist.   Oropharynx clear. Fair dentition, with good repair. Neck: Supple. No lymphadenopathy. No thyromegaly. Lungs: Clear to auscultation bilaterally. CV: RRR without murmurs or rubs. Pulses 2+ bilaterally. Abdomen: Soft, non-tender. Bowel sounds positive, normal pitch and frequency. No  hepatosplenomegaly. No rebound or guarding. Back: Straight. No CVA tenderness bilaterally. Extremities: Full ROM. No joint swelling or tenderness. No edema noted. Skin: Warm and dry. No rashes. Psych: Alert and oriented. Normal mood and affect.  Health Maintenance Due  Topic Date Due  . Hepatitis C Screening  Never done  . COVID-19 Vaccine (1) Never done      Assessment & Plan:   1. Essential  hypertension Blood pressure is borderline high today. We will monitor this for now. I did review her prior cardiology notes and echocardiogram results. Based on her exam today  and her prior cardiology assessment, I do feel she can be cleared for surgery. I will send a letter to her urologist concerning this.  2. Cerebral vascular disease No current deficits related to this. On gabapentin for chronic headaches that are apparently related to the old stroke.  3. Gastroesophageal reflux disease with hiatal hernia Stable on Prilosec and carafate. I reviewed the results of her prior EGD. She will continue to follow with GI.  4. Irritable bowel syndrome with constipation On Linzess. She will continue to follow with GI.  5. Hyperglycemia Review of prior labs shows mild hyperglycemia. I will check an HbA1c to screen for diabetes.  - Hemoglobin A1c  6. Hyperlipidemia, mixed Prior hyperlipidemia, intolerant of Crestor. I will check her current lipids to determine need for other lipid-lowering medications.  - Lipid panel  7. Depression with anxiety Apparently stable on limited Xanax. Will continue this approach for now, as she has been intolerant of multiple antidepressants.  8. Left flank pain We will check a UA to rule-ou a UTI. Otherwise, this may be related to her IBS, as there are no other signs of acute illness on exam.  - Urinalysis w microscopic + reflex cultur  9. Uterine prolapse Cleared for surgery as noted above.  Haydee Salter, MD

## 2020-11-05 NOTE — Patient Instructions (Signed)
Stop aspirin 5 days prior to surgery.

## 2020-11-06 ENCOUNTER — Encounter: Payer: Self-pay | Admitting: Family Medicine

## 2020-11-07 ENCOUNTER — Ambulatory Visit
Admission: RE | Admit: 2020-11-07 | Discharge: 2020-11-07 | Disposition: A | Discharge status: Home / Self Care | Payer: Medicare HMO | Source: Ambulatory Visit | Attending: Physician Assistant | Admitting: Physician Assistant

## 2020-11-07 ENCOUNTER — Other Ambulatory Visit: Payer: Self-pay | Admitting: Physician Assistant

## 2020-11-07 ENCOUNTER — Other Ambulatory Visit: Payer: Self-pay

## 2020-11-07 DIAGNOSIS — N644 Mastodynia: Secondary | ICD-10-CM | POA: Diagnosis not present

## 2020-11-07 DIAGNOSIS — R599 Enlarged lymph nodes, unspecified: Secondary | ICD-10-CM

## 2020-11-07 DIAGNOSIS — N6489 Other specified disorders of breast: Secondary | ICD-10-CM | POA: Diagnosis not present

## 2020-11-07 LAB — URINALYSIS W MICROSCOPIC + REFLEX CULTURE
Bacteria, UA: NONE SEEN /HPF
Bilirubin Urine: NEGATIVE
Glucose, UA: NEGATIVE
Hgb urine dipstick: NEGATIVE
Hyaline Cast: NONE SEEN /LPF
Ketones, ur: NEGATIVE
Nitrites, Initial: NEGATIVE
Protein, ur: NEGATIVE
RBC / HPF: NONE SEEN /HPF (ref 0–2)
Specific Gravity, Urine: 1.006 (ref 1.001–1.03)
pH: 6.5 (ref 5.0–8.0)

## 2020-11-07 LAB — URINE CULTURE
MICRO NUMBER:: 11674109
SPECIMEN QUALITY:: ADEQUATE

## 2020-11-07 LAB — CULTURE INDICATED

## 2020-11-07 NOTE — Progress Notes (Addendum)
DUE TO COVID-19 ONLY ONE VISITOR IS ALLOWED TO COME WITH YOU AND STAY IN THE WAITING ROOM ONLY DURING PRE OP AND PROCEDURE DAY OF SURGERY. THE 1 VISITOR  MAY VISIT WITH YOU AFTER SURGERY IN YOUR PRIVATE ROOM DURING VISITING HOURS ONLY!  YOU NEED TO HAVE A COVID 19 TEST ON_3/28/22_____ @_______ , THIS TEST MUST BE DONE BEFORE SURGERY,  COVID TESTING SITE [address] [place] [address], IT IS ON THE RIGHT GOING OUT [address] APPROXIMATELY  2 MINUTES PAST [employer] ON THE RIGHT. ONCE YOUR COVID TEST IS COMPLETED,  PLEASE BEGIN THE QUARANTINE INSTRUCTIONS AS OUTLINED IN YOUR HANDOUT.                Megan [name]  11/07/2020   Your procedure is scheduled on:  11/15/20  Report to [facility] Main  Entrance   Report to admitting at     [place] AM     Call this number if you have problems the morning of surgery [phone]    Remember: Do not eat food , candy gum or mints :After Midnight. You may have clear liquids from midnight until 0430am    CLEAR LIQUID DIET   Foods Allowed                                                                       Coffee and tea, regular and decaf                              Plain Jell-O any favor except red or purple                                            Fruit ices (not with fruit pulp)                                      Iced Popsicles                                     Carbonated beverages, regular and diet                                    Cranberry, grape and apple juices Sports drinks like Gatorade Lightly seasoned clear broth or consume(fat free) Sugar, honey syrup   _____________________________________________________________________    BRUSH YOUR TEETH MORNING OF SURGERY AND RINSE YOUR MOUTH OUT, NO CHEWING GUM CANDY OR MINTS.     Take these medicines the morning of surgery with A SIP OF WATER: Inhalers as usual and bring, , diltiazem., gabapentin, toprol , prilosec  DO NOT TAKE ANY DIABETIC  MEDICATIONS DAY OF YOUR SURGERY                               You  may not have any metal on your body including hair pins and              piercings  Do not wear jewelry, make-up, lotions, powders or perfumes, deodorant             Do not wear nail polish on your fingernails.  Do not shave  48 hours prior to surgery.              Men may shave face and neck.   Do not bring valuables to the hospital. [place].  Contacts, dentures or bridgework may not be worn into surgery.  Leave suitcase in the car. After surgery it may be brought to your room.     Patients discharged the day of surgery will not be allowed to drive home. IF YOU ARE HAVING SURGERY AND GOING HOME THE SAME DAY, YOU MUST HAVE AN ADULT TO DRIVE YOU HOME AND BE WITH YOU FOR 24 HOURS. YOU MAY GO HOME BY TAXI OR UBER OR ORTHERWISE, BUT AN ADULT MUST ACCOMPANY YOU HOME AND STAY WITH YOU FOR 24 HOURS.  Name and phone number of your driver:  Special Instructions: N/A              Please read over the following fact sheets you were given: _____________________________________________________________________  [facility] - Preparing for Surgery Before surgery, you can play an important role.  Because skin is not sterile, your skin needs to be as free of germs as possible.  You can reduce the number of germs on your skin by washing with CHG (chlorahexidine gluconate) soap before surgery.  CHG is an antiseptic cleaner which kills germs and bonds with the skin to continue killing germs even after washing. Please DO NOT use if you have an allergy to CHG or antibacterial soaps.  If your skin becomes reddened/irritated stop using the CHG and inform your nurse when you arrive at Short Stay. Do not shave (including legs and underarms) for at least 48 hours prior to the first CHG shower.  You may shave your face/neck. Please follow these instructions carefully:  1.  Shower with CHG Soap the night  before surgery and the  morning of Surgery.  2.  If you choose to wash your hair, wash your hair first as usual with your  normal  shampoo.  3.  After you shampoo, rinse your hair and body thoroughly to remove the  shampoo.                           4.  Use CHG as you would any other liquid soap.  You can apply chg directly  to the skin and wash                       Gently with a scrungie or clean washcloth.  5.  Apply the CHG Soap to your body ONLY FROM THE NECK DOWN.   Do not use on face/ open                           Wound or open sores. Avoid contact with eyes, ears mouth and genitals (private parts).  Wash face,  Genitals (private parts) with your normal soap.             6.  Wash thoroughly, paying special attention to the area where your surgery  will be performed.  7.  Thoroughly rinse your body with warm water from the neck down.  8.  DO NOT shower/wash with your normal soap after using and rinsing off  the CHG Soap.                9.  Pat yourself dry with a clean towel.            10.  Wear clean pajamas.            11.  Place clean sheets on your bed the night of your first shower and do not  sleep with pets. Day of Surgery : Do not apply any lotions/deodorants the morning of surgery.  Please wear clean clothes to the hospital/surgery center.  FAILURE TO FOLLOW THESE INSTRUCTIONS MAY RESULT IN THE CANCELLATION OF YOUR SURGERY PATIENT SIGNATURE_________________________________  NURSE SIGNATURE__________________________________  ________________________________________________________________________

## 2020-11-09 ENCOUNTER — Encounter (HOSPITAL_COMMUNITY)
Admission: RE | Admit: 2020-11-09 | Discharge: 2020-11-09 | Disposition: A | Discharge status: Home / Self Care | Payer: Medicare HMO | Source: Ambulatory Visit | Attending: Urology | Admitting: Urology

## 2020-11-09 ENCOUNTER — Encounter (HOSPITAL_COMMUNITY): Payer: Self-pay

## 2020-11-09 ENCOUNTER — Other Ambulatory Visit: Payer: Self-pay

## 2020-11-09 DIAGNOSIS — Z01812 Encounter for preprocedural laboratory examination: Secondary | ICD-10-CM | POA: Insufficient documentation

## 2020-11-09 HISTORY — DX: Cardiac murmur, unspecified: R01.1

## 2020-11-09 LAB — BASIC METABOLIC PANEL
Anion gap: 8 (ref 5–15)
BUN: 11 mg/dL (ref 8–23)
CO2: 26 mmol/L (ref 22–32)
Calcium: 9.3 mg/dL (ref 8.9–10.3)
Chloride: 104 mmol/L (ref 98–111)
Creatinine, Ser: 0.72 mg/dL (ref 0.44–1.00)
GFR, Estimated: >60 mL/min (ref >60)
Glucose, Bld: 103 mg/dL — ABNORMAL HIGH (ref 70–99)
Potassium: 4.8 mmol/L (ref 3.5–5.1)
Sodium: 138 mmol/L (ref 135–145)

## 2020-11-09 LAB — CBC
HCT: 45 % (ref 36.0–46.0)
Hemoglobin: 14.9 g/dL (ref 12.0–15.0)
MCH: 31.5 pg (ref 26.0–34.0)
MCHC: 33.1 g/dL (ref 30.0–36.0)
MCV: 95.1 fL (ref 80.0–100.0)
Platelets: 328 10*3/uL (ref 150–400)
RBC: 4.73 MIL/uL (ref 3.87–5.11)
RDW: 11.9 % (ref 11.5–15.5)
WBC: 7.5 10*3/uL (ref 4.0–10.5)
nRBC: 0 % (ref 0.0–0.2)

## 2020-11-09 NOTE — Progress Notes (Addendum)
       Anesthesia Review:  PCP: DR Arlester Marker L[facility] 11/05/20 Clearance on chart dated 11/05/20 Cardiologist : DR Burt Knack L[facility] 12/12/19   Chest x-ray : EKG :07/25/2020  Echo : 11/2019  Stress test: 2017  Cardiac Cath :  Activity level: can do a flight of stairs without difficulty  Sleep Study/ CPAP : no  Fasting Blood Sugar :      / Checks Blood Sugar -- times a day:   Blood Thinner/ Instructions /Last Dose: ASA / Instructions/ Last Dose :  81 mg Aspirin  hgba1c-11/05/20-5.6

## 2020-11-12 ENCOUNTER — Other Ambulatory Visit (HOSPITAL_COMMUNITY)
Admission: RE | Admit: 2020-11-12 | Discharge: 2020-11-12 | Disposition: A | Discharge status: Home / Self Care | Payer: Medicare HMO | Source: Ambulatory Visit | Attending: Urology | Admitting: Urology

## 2020-11-12 DIAGNOSIS — Z01812 Encounter for preprocedural laboratory examination: Secondary | ICD-10-CM | POA: Diagnosis not present

## 2020-11-12 DIAGNOSIS — Z20822 Contact with and (suspected) exposure to covid-19: Secondary | ICD-10-CM | POA: Insufficient documentation

## 2020-11-12 LAB — SARS CORONAVIRUS 2 (TAT 6-24 HRS): SARS Coronavirus 2: NEGATIVE

## 2020-11-14 ENCOUNTER — Other Ambulatory Visit: Payer: Self-pay | Admitting: Physician Assistant

## 2020-11-14 NOTE — Anesthesia Preprocedure Evaluation (Addendum)
Anesthesia Evaluation  Patient identified by MRN, date of birth, ID band Patient awake    Reviewed: Allergy & Precautions, NPO status , Patient's Chart, lab work & pertinent test results, reviewed documented beta blocker date and time   Airway Mallampati: III  TM Distance: >3 FB Neck ROM: Full    Dental  (+) Dental Advisory Given   Pulmonary asthma , sleep apnea , former smoker,    breath sounds clear to auscultation       Cardiovascular hypertension, Pt. on medications and Pt. on home beta blockers + Peripheral Vascular Disease   Rhythm:Regular Rate:Normal     Neuro/Psych  Headaches,    GI/Hepatic Neg liver ROS, hiatal hernia, GERD  ,  Endo/Other  Hypothyroidism   Renal/GU negative Renal ROS     Musculoskeletal   Abdominal   Peds  Hematology negative hematology ROS (+)   Anesthesia Other Findings   Reproductive/Obstetrics                            Lab Results  Component Value Date   WBC 7.5 11/09/2020   HGB 14.9 11/09/2020   HCT 45.0 11/09/2020   MCV 95.1 11/09/2020   PLT 328 11/09/2020   Lab Results  Component Value Date   CREATININE 0.72 11/09/2020   BUN 11 11/09/2020   NA 138 11/09/2020   K 4.8 11/09/2020   CL 104 11/09/2020   CO2 26 11/09/2020   1. Left ventricular ejection fraction, by estimation, is 60 to 65%. The  left ventricle has normal function. The left ventricle has no regional  wall motion abnormalities. Left ventricular diastolic parameters were  normal.  2. Right ventricular systolic function is normal. The right ventricular  size is normal. There is mildly elevated pulmonary artery systolic  pressure.  3. Left atrial size was mildly dilated.  4. The mitral valve is normal in structure. Trivial mitral valve  regurgitation. No evidence of mitral stenosis.  5. Tricuspid valve regurgitation is mild to moderate.  6. The aortic valve is normal in structure.  Aortic valve regurgitation is  not visualized. No aortic stenosis is present.  7. The inferior vena cava is normal in size with greater than 50%  respiratory variability, suggesting right atrial pressure of 3 mmHg.  Anesthesia Physical Anesthesia Plan  ASA: II  Anesthesia Plan: General   Post-op Pain Management:    Induction: Intravenous  PONV Risk Score and Plan: 4 or greater and Propofol infusion, Dexamethasone, Ondansetron and Treatment may vary due to age or medical condition  Airway Management Planned: Oral ETT  Additional Equipment:   Intra-op Plan:   Post-operative Plan: Extubation in OR  Informed Consent: I have reviewed the patients History and Physical, chart, labs and discussed the procedure including the risks, benefits and alternatives for the proposed anesthesia with the patient or authorized representative who has indicated his/her understanding and acceptance.     Dental advisory given  Plan Discussed with: CRNA  Anesthesia Plan Comments:        Anesthesia Quick Evaluation

## 2020-11-15 ENCOUNTER — Encounter (HOSPITAL_COMMUNITY)
Admission: RE | Disposition: A | Discharge status: Home / Self Care | Payer: Self-pay | Source: Other Acute Inpatient Hospital | Attending: Urology

## 2020-11-15 ENCOUNTER — Ambulatory Visit (HOSPITAL_COMMUNITY): Payer: Medicare HMO | Admitting: Certified Registered"

## 2020-11-15 ENCOUNTER — Ambulatory Visit (HOSPITAL_COMMUNITY): Payer: Medicare HMO | Admitting: Physician Assistant

## 2020-11-15 ENCOUNTER — Observation Stay (HOSPITAL_COMMUNITY)
Admission: RE | Admit: 2020-11-15 | Discharge: 2020-11-16 | Disposition: A | Discharge status: Home / Self Care | Payer: Medicare HMO | Source: Other Acute Inpatient Hospital | Attending: Urology | Admitting: Urology

## 2020-11-15 ENCOUNTER — Encounter (HOSPITAL_COMMUNITY): Payer: Self-pay | Admitting: Urology

## 2020-11-15 ENCOUNTER — Other Ambulatory Visit: Payer: Self-pay

## 2020-11-15 DIAGNOSIS — N858 Other specified noninflammatory disorders of uterus: Secondary | ICD-10-CM | POA: Diagnosis not present

## 2020-11-15 DIAGNOSIS — N819 Female genital prolapse, unspecified: Principal | ICD-10-CM | POA: Insufficient documentation

## 2020-11-15 DIAGNOSIS — I739 Peripheral vascular disease, unspecified: Secondary | ICD-10-CM | POA: Diagnosis not present

## 2020-11-15 DIAGNOSIS — J45909 Unspecified asthma, uncomplicated: Secondary | ICD-10-CM | POA: Insufficient documentation

## 2020-11-15 DIAGNOSIS — Z87891 Personal history of nicotine dependence: Secondary | ICD-10-CM | POA: Diagnosis not present

## 2020-11-15 DIAGNOSIS — I10 Essential (primary) hypertension: Secondary | ICD-10-CM | POA: Diagnosis not present

## 2020-11-15 DIAGNOSIS — Z79899 Other long term (current) drug therapy: Secondary | ICD-10-CM | POA: Diagnosis not present

## 2020-11-15 DIAGNOSIS — K219 Gastro-esophageal reflux disease without esophagitis: Secondary | ICD-10-CM | POA: Diagnosis not present

## 2020-11-15 DIAGNOSIS — N814 Uterovaginal prolapse, unspecified: Secondary | ICD-10-CM | POA: Diagnosis present

## 2020-11-15 DIAGNOSIS — E782 Mixed hyperlipidemia: Secondary | ICD-10-CM | POA: Diagnosis not present

## 2020-11-15 DIAGNOSIS — G4733 Obstructive sleep apnea (adult) (pediatric): Secondary | ICD-10-CM | POA: Diagnosis not present

## 2020-11-15 HISTORY — PX: ROBOTIC ASSISTED SUPRACERVICAL HYSTERECTOMY: SHX6083

## 2020-11-15 HISTORY — PX: ROBOTIC ASSISTED LAPAROSCOPIC SACROCOLPOPEXY: SHX5388

## 2020-11-15 HISTORY — PX: ROBOTIC ASSISTED BILATERAL SALPINGO OOPHERECTOMY: SHX6078

## 2020-11-15 LAB — TYPE AND SCREEN
ABO/RH(D): A POS
Antibody Screen: NEGATIVE

## 2020-11-15 LAB — ABO/RH: ABO/RH(D): A POS

## 2020-11-15 LAB — HEMOGLOBIN AND HEMATOCRIT, BLOOD
HCT: 41 % (ref 36.0–46.0)
Hemoglobin: 13.7 g/dL (ref 12.0–15.0)

## 2020-11-15 SURGERY — SACROCOLPOPEXY, ROBOT-ASSISTED, LAPAROSCOPIC
Anesthesia: General | Laterality: Bilateral

## 2020-11-15 MED ORDER — ESTRADIOL 0.1 MG/GM VA CREA
TOPICAL_CREAM | VAGINAL | Status: AC
  Filled 2020-11-15: qty 42.5

## 2020-11-15 MED ORDER — DIPHENHYDRAMINE HCL 50 MG/ML IJ SOLN: 12.5000 mg | Freq: Four times a day (QID) | INTRAMUSCULAR | Status: DC | PRN

## 2020-11-15 MED ORDER — LACTATED RINGERS IR SOLN
Status: DC | PRN
  Administered 2020-11-15: 1

## 2020-11-15 MED ORDER — ACETAMINOPHEN 10 MG/ML IV SOLN
1000.0000 mg | Freq: Four times a day (QID) | INTRAVENOUS | Status: AC
  Administered 2020-11-15 – 2020-11-16 (×4): 1000 mg via INTRAVENOUS
  Filled 2020-11-15 (×3): qty 100

## 2020-11-15 MED ORDER — ONDANSETRON HCL 4 MG/2ML IJ SOLN
INTRAMUSCULAR | Status: DC | PRN
  Administered 2020-11-15: 4 mg via INTRAVENOUS

## 2020-11-15 MED ORDER — KETAMINE HCL 10 MG/ML IJ SOLN
INTRAMUSCULAR | Status: DC | PRN
  Administered 2020-11-15: 20 mg via INTRAVENOUS

## 2020-11-15 MED ORDER — PANTOPRAZOLE SODIUM 40 MG PO TBEC
40.0000 mg | DELAYED_RELEASE_TABLET | Freq: Every day | ORAL | Status: DC
  Administered 2020-11-16: 40 mg via ORAL
  Filled 2020-11-15: qty 1

## 2020-11-15 MED ORDER — ROCURONIUM BROMIDE 10 MG/ML (PF) SYRINGE
PREFILLED_SYRINGE | INTRAVENOUS | Status: DC | PRN
  Administered 2020-11-15 (×2): 20 mg via INTRAVENOUS
  Administered 2020-11-15: 40 mg via INTRAVENOUS
  Administered 2020-11-15: 10 mg via INTRAVENOUS

## 2020-11-15 MED ORDER — FENTANYL CITRATE (PF) 100 MCG/2ML IJ SOLN
INTRAMUSCULAR | Status: AC
  Filled 2020-11-15: qty 2

## 2020-11-15 MED ORDER — FENTANYL CITRATE (PF) 100 MCG/2ML IJ SOLN
25.0000 ug | INTRAMUSCULAR | Status: DC | PRN
  Administered 2020-11-15 (×2): 50 ug via INTRAVENOUS

## 2020-11-15 MED ORDER — LIDOCAINE 2% (20 MG/ML) 5 ML SYRINGE
INTRAMUSCULAR | Status: AC
  Filled 2020-11-15: qty 5

## 2020-11-15 MED ORDER — ROCURONIUM BROMIDE 10 MG/ML (PF) SYRINGE
PREFILLED_SYRINGE | INTRAVENOUS | Status: AC
  Filled 2020-11-15: qty 10

## 2020-11-15 MED ORDER — ESTRADIOL 0.1 MG/GM VA CREA
TOPICAL_CREAM | VAGINAL | Status: DC | PRN
  Administered 2020-11-15: 1 via VAGINAL

## 2020-11-15 MED ORDER — SUGAMMADEX SODIUM 200 MG/2ML IV SOLN
INTRAVENOUS | Status: DC | PRN
  Administered 2020-11-15: 130 mg via INTRAVENOUS

## 2020-11-15 MED ORDER — PROPOFOL 500 MG/50ML IV EMUL
INTRAVENOUS | Status: DC | PRN
  Administered 2020-11-15: 25 ug/kg/min via INTRAVENOUS

## 2020-11-15 MED ORDER — CHLORHEXIDINE GLUCONATE 0.12 % MT SOLN
15.0000 mL | Freq: Once | OROMUCOSAL | Status: AC
  Administered 2020-11-15: 15 mL via OROMUCOSAL

## 2020-11-15 MED ORDER — KETOROLAC TROMETHAMINE 15 MG/ML IJ SOLN
INTRAMUSCULAR | Status: AC
  Filled 2020-11-15: qty 1

## 2020-11-15 MED ORDER — PROPOFOL 10 MG/ML IV BOLUS
INTRAVENOUS | Status: AC
  Filled 2020-11-15: qty 40

## 2020-11-15 MED ORDER — PHENYLEPHRINE HCL-NACL 10-0.9 MG/250ML-% IV SOLN
INTRAVENOUS | Status: DC | PRN
  Administered 2020-11-15: 20 ug/min via INTRAVENOUS

## 2020-11-15 MED ORDER — PHENYLEPHRINE HCL (PRESSORS) 10 MG/ML IV SOLN
INTRAVENOUS | Status: AC
  Filled 2020-11-15: qty 1

## 2020-11-15 MED ORDER — ACETAMINOPHEN 500 MG PO TABS
1000.0000 mg | ORAL_TABLET | Freq: Once | ORAL | Status: AC
  Administered 2020-11-15: 1000 mg via ORAL
  Filled 2020-11-15: qty 2

## 2020-11-15 MED ORDER — GABAPENTIN 100 MG PO CAPS
100.0000 mg | ORAL_CAPSULE | Freq: Three times a day (TID) | ORAL | Status: DC
  Administered 2020-11-15 – 2020-11-16 (×3): 100 mg via ORAL
  Filled 2020-11-15 (×3): qty 1

## 2020-11-15 MED ORDER — SUCRALFATE 1 G PO TABS: 1.0000 g | ORAL_TABLET | Freq: Every day | ORAL | Status: DC | PRN

## 2020-11-15 MED ORDER — STERILE WATER FOR IRRIGATION IR SOLN
Status: DC | PRN
  Administered 2020-11-15: 1000 mL

## 2020-11-15 MED ORDER — METOPROLOL SUCCINATE ER 25 MG PO TB24
25.0000 mg | ORAL_TABLET | Freq: Every day | ORAL | Status: DC
  Administered 2020-11-16: 25 mg via ORAL
  Filled 2020-11-15: qty 1

## 2020-11-15 MED ORDER — AMISULPRIDE (ANTIEMETIC) 5 MG/2ML IV SOLN: 10.0000 mg | Freq: Once | INTRAVENOUS | Status: DC | PRN

## 2020-11-15 MED ORDER — TRAMADOL HCL 50 MG PO TABS: 50.0000 mg | ORAL_TABLET | Freq: Four times a day (QID) | ORAL | Status: DC | PRN

## 2020-11-15 MED ORDER — BUPIVACAINE-EPINEPHRINE 0.5% -1:200000 IJ SOLN
INTRAMUSCULAR | Status: DC | PRN
  Administered 2020-11-15: 35 mL

## 2020-11-15 MED ORDER — ALPRAZOLAM 1 MG PO TABS
1.0000 mg | ORAL_TABLET | Freq: Two times a day (BID) | ORAL | Status: DC | PRN
  Administered 2020-11-15: 1 mg via ORAL
  Filled 2020-11-15: qty 1

## 2020-11-15 MED ORDER — LIDOCAINE 2% (20 MG/ML) 5 ML SYRINGE
INTRAMUSCULAR | Status: DC | PRN
  Administered 2020-11-15: 60 mg via INTRAVENOUS

## 2020-11-15 MED ORDER — HYDROMORPHONE HCL 1 MG/ML IJ SOLN: 0.5000 mg | INTRAMUSCULAR | Status: DC | PRN

## 2020-11-15 MED ORDER — KETOROLAC TROMETHAMINE 15 MG/ML IJ SOLN
15.0000 mg | Freq: Four times a day (QID) | INTRAMUSCULAR | Status: DC
Start: 2020-11-15 — End: 2020-11-16
  Administered 2020-11-15 – 2020-11-16 (×5): 15 mg via INTRAVENOUS
  Filled 2020-11-15 (×4): qty 1

## 2020-11-15 MED ORDER — EPHEDRINE SULFATE-NACL 50-0.9 MG/10ML-% IV SOSY
PREFILLED_SYRINGE | INTRAVENOUS | Status: DC | PRN
  Administered 2020-11-15: 5 mg via INTRAVENOUS

## 2020-11-15 MED ORDER — PROPOFOL 10 MG/ML IV BOLUS
INTRAVENOUS | Status: DC | PRN
  Administered 2020-11-15: 120 mg via INTRAVENOUS

## 2020-11-15 MED ORDER — CEFAZOLIN SODIUM-DEXTROSE 1-4 GM/50ML-% IV SOLN
1.0000 g | Freq: Three times a day (TID) | INTRAVENOUS | Status: AC
  Administered 2020-11-15 – 2020-11-16 (×2): 1 g via INTRAVENOUS
  Filled 2020-11-15 (×2): qty 50

## 2020-11-15 MED ORDER — DILTIAZEM HCL ER COATED BEADS 180 MG PO CP24
180.0000 mg | ORAL_CAPSULE | Freq: Every day | ORAL | Status: DC
  Administered 2020-11-16: 180 mg via ORAL
  Filled 2020-11-15: qty 1

## 2020-11-15 MED ORDER — LACTATED RINGERS IV SOLN: INTRAVENOUS | Status: DC | PRN

## 2020-11-15 MED ORDER — ORAL CARE MOUTH RINSE: 15.0000 mL | Freq: Once | OROMUCOSAL | Status: AC

## 2020-11-15 MED ORDER — SENNOSIDES-DOCUSATE SODIUM 8.6-50 MG PO TABS
2.0000 | ORAL_TABLET | Freq: Every day | ORAL | Status: DC
  Administered 2020-11-15: 2 via ORAL
  Filled 2020-11-15: qty 2

## 2020-11-15 MED ORDER — BELLADONNA ALKALOIDS-OPIUM 16.2-60 MG RE SUPP: 1.0000 | Freq: Four times a day (QID) | RECTAL | Status: DC | PRN

## 2020-11-15 MED ORDER — LIDOCAINE 20MG/ML (2%) 15 ML SYRINGE OPTIME
INTRAMUSCULAR | Status: DC | PRN
  Administered 2020-11-15: 1.5 mg/kg/h via INTRAVENOUS

## 2020-11-15 MED ORDER — GABAPENTIN 300 MG PO CAPS
300.0000 mg | ORAL_CAPSULE | Freq: Once | ORAL | Status: AC
  Administered 2020-11-15: 300 mg via ORAL
  Filled 2020-11-15: qty 1

## 2020-11-15 MED ORDER — ACETAMINOPHEN 10 MG/ML IV SOLN
INTRAVENOUS | Status: AC
  Filled 2020-11-15: qty 100

## 2020-11-15 MED ORDER — ONDANSETRON HCL 4 MG/2ML IJ SOLN: 4.0000 mg | INTRAMUSCULAR | Status: DC | PRN

## 2020-11-15 MED ORDER — KETAMINE HCL 10 MG/ML IJ SOLN
INTRAMUSCULAR | Status: AC
  Filled 2020-11-15: qty 1

## 2020-11-15 MED ORDER — LIDOCAINE HCL 2 % IJ SOLN
INTRAMUSCULAR | Status: AC
  Filled 2020-11-15: qty 40

## 2020-11-15 MED ORDER — DOCUSATE SODIUM 100 MG PO CAPS: 100.0000 mg | ORAL_CAPSULE | Freq: Two times a day (BID) | ORAL | Status: DC

## 2020-11-15 MED ORDER — ONDANSETRON HCL 4 MG/2ML IJ SOLN
INTRAMUSCULAR | Status: AC
  Filled 2020-11-15: qty 2

## 2020-11-15 MED ORDER — LACTATED RINGERS IV SOLN: INTRAVENOUS | Status: DC

## 2020-11-15 MED ORDER — ATORVASTATIN CALCIUM 20 MG PO TABS
20.0000 mg | ORAL_TABLET | Freq: Every day | ORAL | Status: DC
  Administered 2020-11-16: 20 mg via ORAL
  Filled 2020-11-15: qty 1

## 2020-11-15 MED ORDER — DEXAMETHASONE SODIUM PHOSPHATE 10 MG/ML IJ SOLN
INTRAMUSCULAR | Status: AC
  Filled 2020-11-15: qty 1

## 2020-11-15 MED ORDER — FENTANYL CITRATE (PF) 250 MCG/5ML IJ SOLN
INTRAMUSCULAR | Status: DC | PRN
  Administered 2020-11-15 (×2): 50 ug via INTRAVENOUS

## 2020-11-15 MED ORDER — ALBUTEROL SULFATE HFA 108 (90 BASE) MCG/ACT IN AERS
2.0000 | INHALATION_SPRAY | Freq: Four times a day (QID) | RESPIRATORY_TRACT | Status: DC | PRN
  Filled 2020-11-15: qty 6.7

## 2020-11-15 MED ORDER — CEPHALEXIN 500 MG PO CAPS: 500.0000 mg | ORAL_CAPSULE | Freq: Two times a day (BID) | ORAL | 0 refills | Status: DC

## 2020-11-15 MED ORDER — BUPIVACAINE-EPINEPHRINE 0.5% -1:200000 IJ SOLN
INTRAMUSCULAR | Status: AC
  Filled 2020-11-15: qty 1

## 2020-11-15 MED ORDER — MIDAZOLAM HCL 2 MG/2ML IJ SOLN
INTRAMUSCULAR | Status: AC
  Filled 2020-11-15: qty 2

## 2020-11-15 MED ORDER — BUPIVACAINE LIPOSOME 1.3 % IJ SUSP
20.0000 mL | Freq: Once | INTRAMUSCULAR | Status: AC
  Administered 2020-11-15: 20 mL
  Filled 2020-11-15: qty 20

## 2020-11-15 MED ORDER — DIPHENHYDRAMINE HCL 12.5 MG/5ML PO ELIX: 12.5000 mg | ORAL_SOLUTION | Freq: Four times a day (QID) | ORAL | Status: DC | PRN

## 2020-11-15 MED ORDER — CEFAZOLIN SODIUM-DEXTROSE 2-4 GM/100ML-% IV SOLN
2.0000 g | INTRAVENOUS | Status: AC
  Administered 2020-11-15: 2 g via INTRAVENOUS
  Filled 2020-11-15: qty 100

## 2020-11-15 MED ORDER — TRAMADOL HCL 50 MG PO TABS: 50.0000 mg | ORAL_TABLET | Freq: Four times a day (QID) | ORAL | 0 refills | Status: DC | PRN

## 2020-11-15 MED ORDER — DEXAMETHASONE SODIUM PHOSPHATE 10 MG/ML IJ SOLN
INTRAMUSCULAR | Status: DC | PRN
  Administered 2020-11-15: 4 mg via INTRAVENOUS

## 2020-11-15 MED ORDER — DEXTROSE-NACL 5-0.45 % IV SOLN: INTRAVENOUS | Status: DC

## 2020-11-15 MED ORDER — MIDAZOLAM HCL 2 MG/2ML IJ SOLN
INTRAMUSCULAR | Status: DC | PRN
  Administered 2020-11-15: 2 mg via INTRAVENOUS

## 2020-11-15 SURGICAL SUPPLY — 63 items
BAG URINE DRAIN 2000ML AR STRL (UROLOGICAL SUPPLIES) IMPLANT
CATH FOLEY 2WAY SLVR  5CC 16FR (CATHETERS) ×2
CATH FOLEY 2WAY SLVR 5CC 16FR (CATHETERS) ×1 IMPLANT
CHLORAPREP W/TINT 26 (MISCELLANEOUS) ×2 IMPLANT
CLIP VESOLOCK LG 6/CT PURPLE (CLIP) ×2 IMPLANT
CLIP VESOLOCK MED LG 6/CT (CLIP) ×2 IMPLANT
CLIP VESOLOCK XL 6/CT (CLIP) IMPLANT
COVER SURGICAL LIGHT HANDLE (MISCELLANEOUS) ×2 IMPLANT
COVER TIP SHEARS 8 DVNC (MISCELLANEOUS) ×1 IMPLANT
COVER TIP SHEARS 8MM DA VINCI (MISCELLANEOUS) ×2
COVER WAND RF STERILE (DRAPES) IMPLANT
DERMABOND ADVANCED (GAUZE/BANDAGES/DRESSINGS) ×1
DERMABOND ADVANCED .7 DNX12 (GAUZE/BANDAGES/DRESSINGS) ×1 IMPLANT
DRAIN CHANNEL RND F F (WOUND CARE) IMPLANT
DRAPE ARM DVNC X/XI (DISPOSABLE) ×4 IMPLANT
DRAPE COLUMN DVNC XI (DISPOSABLE) ×1 IMPLANT
DRAPE DA VINCI XI ARM (DISPOSABLE) ×8
DRAPE DA VINCI XI COLUMN (DISPOSABLE) ×2
DRAPE INCISE IOBAN 66X45 STRL (DRAPES) ×2 IMPLANT
DRAPE SHEET LG 3/4 BI-LAMINATE (DRAPES) ×4 IMPLANT
DRAPE SURG IRRIG POUCH 19X23 (DRAPES) ×2 IMPLANT
ELECT PENCIL ROCKER SW 15FT (MISCELLANEOUS) ×2 IMPLANT
ELECT REM PT RETURN 15FT ADLT (MISCELLANEOUS) ×2 IMPLANT
GAUZE 4X4 16PLY RFD (DISPOSABLE) IMPLANT
GLOVE SURG ENC MOIS LTX SZ6.5 (GLOVE) ×2 IMPLANT
GLOVE SURG ENC TEXT LTX SZ7.5 (GLOVE) ×6 IMPLANT
GOWN STRL REUS W/TWL LRG LVL3 (GOWN DISPOSABLE) ×2 IMPLANT
GOWN STRL REUS W/TWL XL LVL3 (GOWN DISPOSABLE) ×4 IMPLANT
HOLDER FOLEY CATH W/STRAP (MISCELLANEOUS) ×2 IMPLANT
IRRIG SUCT STRYKERFLOW 2 WTIP (MISCELLANEOUS) ×2
IRRIGATION SUCT STRKRFLW 2 WTP (MISCELLANEOUS) ×1 IMPLANT
KIT BASIN OR (CUSTOM PROCEDURE TRAY) ×2 IMPLANT
KIT TURNOVER KIT A (KITS) ×2 IMPLANT
MANIPULATOR UTERINE 4.5 ZUMI (MISCELLANEOUS) ×2 IMPLANT
MARKER SKIN DUAL TIP RULER LAB (MISCELLANEOUS) ×2 IMPLANT
MESH Y UPSYLON VAGINAL (Mesh General) ×2 IMPLANT
OCCLUDER COLPOPNEUMO (BALLOONS) IMPLANT
PACKING VAGINAL (PACKING) ×2 IMPLANT
PAD POSITIONING PINK XL (MISCELLANEOUS) ×2 IMPLANT
POUCH SPECIMEN RETRIEVAL 10MM (ENDOMECHANICALS) IMPLANT
SEAL CANN UNIV 5-8 DVNC XI (MISCELLANEOUS) ×4 IMPLANT
SEAL XI 5MM-8MM UNIVERSAL (MISCELLANEOUS) ×8
SET IRRIG Y TYPE TUR BLADDER L (SET/KITS/TRAYS/PACK) IMPLANT
SET TUBE SMOKE EVAC HIGH FLOW (TUBING) ×2 IMPLANT
SHEET LAVH (DRAPES) ×2 IMPLANT
SOLUTION ELECTROLUBE (MISCELLANEOUS) ×2 IMPLANT
SUT MNCRL AB 4-0 PS2 18 (SUTURE) ×4 IMPLANT
SUT PROLENE 2 0 CT 1 (SUTURE) ×2 IMPLANT
SUT VIC AB 0 CT1 27 (SUTURE) ×2
SUT VIC AB 0 CT1 27XBRD ANTBC (SUTURE) ×1 IMPLANT
SUT VIC AB 2-0 SH 27 (SUTURE) ×10
SUT VIC AB 2-0 SH 27XBRD (SUTURE) ×5 IMPLANT
SUT VIC AB 3-0 SH 27 (SUTURE) ×6
SUT VIC AB 3-0 SH 27X BRD (SUTURE) ×3 IMPLANT
SUT VICRYL 0 UR6 27IN ABS (SUTURE) ×2 IMPLANT
SYR 50ML LL SCALE MARK (SYRINGE) IMPLANT
SYR BULB IRRIG 60ML STRL (SYRINGE) IMPLANT
TOWEL OR 17X26 10 PK STRL BLUE (TOWEL DISPOSABLE) ×2 IMPLANT
TRAY LAPAROSCOPIC (CUSTOM PROCEDURE TRAY) ×2 IMPLANT
TROCAR ENDOPATH XCEL 12X100 BL (ENDOMECHANICALS) IMPLANT
TROCAR XCEL 12X100 BLDLESS (ENDOMECHANICALS) ×2 IMPLANT
TROCAR XCEL NON-BLD 5MMX100MML (ENDOMECHANICALS) ×2 IMPLANT
WATER STERILE IRR 1000ML POUR (IV SOLUTION) ×2 IMPLANT

## 2020-11-15 NOTE — Discharge Instructions (Signed)

## 2020-11-15 NOTE — Anesthesia Postprocedure Evaluation (Signed)
Anesthesia Post Note  Patient: Megan [name]  Procedure(s) Performed: XI ROBOTIC ASSISTED LAPAROSCOPIC SACROCOLPOPEXY AND SUPRACERVICIAL HYSTERECTOMY BILATERAL SALPINGO-OOPHERECTOMY (Bilateral )     Patient location during evaluation: PACU Anesthesia Type: General Level of consciousness: awake and alert Pain management: pain level controlled Vital Signs Assessment: post-procedure vital signs reviewed and stable Respiratory status: spontaneous breathing, nonlabored ventilation, respiratory function stable and patient connected to nasal cannula oxygen Cardiovascular status: blood pressure returned to baseline and stable Postop Assessment: no apparent nausea or vomiting Anesthetic complications: no   No complications documented.  Last Vitals:  Vitals:   11/15/20 1415 11/15/20 1452  BP:  (!) 124/54  Pulse: 75 70  Resp: 14 18  Temp:  36.7 C  SpO2: 100% 99%    Last Pain:  Vitals:   11/15/20 1452  TempSrc: Oral  PainSc:                  Tiajuana Amass

## 2020-11-15 NOTE — Op Note (Signed)
Preoperative diagnosis:  1. Pelvic organ prolapse  Postoperative diagnosis:  1. Same  Procedure: 1. Robotic-assisted laparoscopic supracervical hysterectomy and salpingo-ectomy 2. Robotic-assisted laparoscopic sacrocolpopexy  Surgeon: Ardis Hughs, MD First assistant: Debbrah Alar, PA-C Resident: Celene Squibb, MD  An assistant was required for this surgical procedure.  The duties of the assistant included but were not limited to suctioning, passing suture, camera manipulation, retraction. This procedure would not be able to be performed without an Environmental consultant.   Anesthesia: General  Complications: None  Intraoperative findings:  [employer] Y mesh used for the sacrocolpopexy.  EBL: 25 mL  Specimens: Supracervical hysterectomy and salpingo-ectomy  Indication: Megan [name] is a 70 y.o. female patient with symptomatic pelvic organ prolapse.   After reviewing the management options for treatment, she elected to proceed with the above surgical procedure(s). We have discussed the potential benefits and risks of the procedure, side effects of the proposed treatment, the likelihood of the patient achieving the goals of the procedure, and any potential problems that might occur during the procedure or recuperation. Informed consent has been obtained.  Description of procedure:  The patient was taken to the operating room and general anesthesia was induced. The patient was placed in the dorsal lithotomy position, prepped and draped in the usual sterile fashion, and preoperative antibiotics were administered. A preoperative time-out was performed.   A Foley catheter was then placed and placed to gravity drainage. I then made a periumbilical incision carrying the dissection down to the patient's fascia with electrocautery. Once to the fascia, the fascia was incised and a small puncture hole made in the peritoneum to allow passage of a 6mm port.  The abdomen  was insufflated and the remaining ports placed under digital guidance. 2 ports were placed lateral to the umbilicus on the right proximally 10 cm apart. The most lateral port was approximately 3 cm above the anterior iliac spine. 2 additional ports were placed in the patient's right side in comparable positions to the most lateral port on the right was a 12 mm port.the robot was then docked at an angle from the leg obliquely along the side of the left leg.  We then began our surgery by cleaning up some of the pelvic adhesions to the small bowel and colon. Once this was completed I started dissecting at the sacral promontory located 3 cm medial to the location where the ureter crosses over the iliac vessels at the pelvic brim. The posterior peritoneum was incised and the sacral prominence cleared off an area taking care to avoid the middle sacral vessels and the iliac branches. I then created a posterior peritoneal tunnel starting at the sacral promontory and tunneling down the right pelvic sidewall down into the pelvis breaking back through the posterior peritoneum around the vesico-vaginal junction posteriorly. I then continued the posterior dissection retracting down on the rectum and finding the avascular plane between the posterior vaginal wall and the rectum. I carried this dissection down as far as I could to along the area of the perineal body.  Then focused my attention to the uterus and hysterectomy. I first started by taking the right round ligament with a series of by polar cautery. I then dissected the anterior leaf of the broad ligament slightly more proximal and then distally down across the anterior mucosa salpinx and the internal cervical os. I then took of the uterine ovarian ligament on the right and dissected free the right salpinx. Once the anterior leaf of the  broad ligament had been completely dissected on the patient's right and a small bladder flap had been created anteriorly  attention was turned to the left side where a similar dissection was carried out. I then turned my attention to the anterior plane between the anterior vaginal wall and the bladder. I was able to obtain access to the avascular plane and with a combination of both monopolar cautery and blunt dissection was able to clean and nice down to the bladder neck. I then turned my attention back to the patient's uterus and skeletonized the right uterine artery and vein and then took this with a series of bipolar moves. I then performed a similar uterus pedicle ligation on the left. This point I was able to identify the patient's cervix and came through supracervical with monopolar cautery once the uterus was freed from all its attachments it was pushed into the left paracolic gutter and our attention was turned to placing the wire mesh.  Mesh was measured at approximately 7.5 cm anteriorly and 7.5 cm posteriorly and I cut this on the back table. The mesh was then placed into the patient's abdomen through the assistant port and the anterior leaf was secured down onto the anterior vaginal wall with the apex at the bladder neck. The posterior leaf was then secured down on the posterior vaginal wall. These were sewn down with 2-0 Vicryl. Between 6 and 8 were done on each side. At this point I then went back to the previously dissected sacral promontory and posterior peritoneal tunnel and inserted a instrument through the tunnel and grasped the end of the mesh at the vaginal cuff and pull it up to the sacrum. I then checked to ensure that the sacral mesh was not too tight by performing a vaginal exam. I then secured the sacral leg of the mesh using a 0 Prolene. I then reapproximated the posterior peritoneum with a 2-0 Vicryl in a running fashion around the sacral promontory. The pelvic peritoneum was closed using a pursestring. A small Endo Catch bag was then gently passed through the assistant port and the uterus  was placed in the bag. The bag was then brought out through the camera port once the trochars were removed. We then made a slightly larger extraction incision to remove the uterus. The fascia was then closed with 0 Vicryl in a figure-of-eight fashion. The skin was closed with 4-0 Monocryl's. Dermabond was applied to the incision and exparel injected into the incisions.  The patient was subsequently extubated and returned to the PACU in excellent condition.

## 2020-11-15 NOTE — Transfer of Care (Signed)
Immediate Anesthesia Transfer of Care Note  Patient: Megan [name]  Procedure(s) Performed: XI ROBOTIC ASSISTED LAPAROSCOPIC SACROCOLPOPEXY AND SUPRACERVICIAL HYSTERECTOMY BILATERAL SALPINGO-OOPHERECTOMY (Bilateral )  Patient Location: PACU  Anesthesia Type:General  Level of Consciousness: drowsy and patient cooperative  Airway & Oxygen Therapy: Patient Spontanous Breathing and Patient connected to face mask oxygen  Post-op Assessment: Report given to RN and Post -op Vital signs reviewed and stable  Post vital signs: Reviewed and stable  Last Vitals:  Vitals Value Taken Time  BP 124/59 11/15/20 1136  Temp    Pulse 72 11/15/20 1137  Resp 25 11/15/20 1137  SpO2 97 % 11/15/20 1137  Vitals shown include unvalidated device data.  Last Pain:  Vitals:   11/15/20 0611  TempSrc:   PainSc: 0-No pain         Complications: No complications documented.

## 2020-11-15 NOTE — Anesthesia Procedure Notes (Signed)
Procedure Name: Intubation Date/Time: 11/15/2020 7:36 AM Performed by: Eben Burow, CRNA Pre-anesthesia Checklist: Patient identified, Emergency Drugs available, Suction available, Patient being monitored and Timeout performed Patient Re-evaluated:Patient Re-evaluated prior to induction Oxygen Delivery Method: Circle system utilized Preoxygenation: Pre-oxygenation with 100% oxygen Induction Type: IV induction Ventilation: Mask ventilation without difficulty Laryngoscope Size: Mac and 4 Grade View: Grade I Tube type: Oral Tube size: 7.0 mm Number of attempts: 1 Airway Equipment and Method: Stylet Placement Confirmation: ETT inserted through vocal cords under direct vision,  positive ETCO2 and breath sounds checked- equal and bilateral Secured at: 21 cm Tube secured with: Tape Dental Injury: Teeth and Oropharynx as per pre-operative assessment

## 2020-11-15 NOTE — H&P (Signed)
Pt presents today for pre-operative history and physical exam in anticipation of robotic assisted lap supracervical hysterectomy, bilateral salping-oophorectomy, and sacrocolpopexy by Dr. Louis Meckel on 11/15/20. She is doing well although she continues to have issues with back pain, nausea, and intermittent diarrhea/constipation. She sees her GI physician on a regular basis.    Pt denies F/C, HA, CP, SOB, V, flank pain, hematuria, and dysuria.   HX:     CC: Pelvic organ prolapse  HPI: Megan [name] is a 70 year-old female established patient who is here for further evaluation of her pelvic prolapse.  Patient with a known history of a uterine prolapse. She has been seen in the past for urinary urge and associated incontinence. She was last seen in 2013. At that time she was using oxybutynin gel.  Patient has gastrointestinal issues. She gets up around 3-5 times, noticed worsening symptoms 4 months ago.   Her GYN placed a pessary in for her causing her pain and issues with bowel movements and she removed this. She does have issues with her bowels and wears pads for this. She goes from constipation to loose stools.   Interval: The patient presented 1 week after I initially evaluated her so that we could replace her pessary because of the pain and discomfort as well as difficulty with bowel movements. We replaced the pessary, and I reiterated the importance of pelvic floor physical therapy. She was to have urodynamics prior to her appointment today so that we could discuss hysterectomy and sacral colpopexy.    The patient had exploratory surgery in her 52s and had her appendix removed. She has not had any intra-abdominal surgeries since that time. 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    Recently the patient has struggled with headaches, she seeing a neurologist for this.   She first noticed her pelvic prolapse approximately 12/17/2019. She does have pelvic pain. Her  symptoms have been worse and stable over the last year.   She does not have an abnormal sensation when she needs to urinate. She is not urinating more frequently now than usual. She does have a good size and strength to her urinary stream. She is having problems with emptying her bladder well.   She is having problems with urinary control or incontinence. She does not wear protective pads. The patient does not wear pads. She can get to the bathroom in time when she gets the urge to urinate. She does not leak urine when she coughs, laughs, sneezes or bears down. The patient does not have a history of recurrent UTIs.   The patient does posture when she voids or defecates. The patient has or has had trouble with constipation.   The patient has no history of pelvic floor surgery - she maintains her uterus.     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 (2017)  left wrist surgery   NON-GU PSH: Appendectomy - 2012 Diagnostic Colonoscopy - 2012  GU PMH: Female genital prolapse, unspecified - 08/14/2020, - 07/03/2020, - 06/18/2020 Urinary Frequency - 08/14/2020, - 06/18/2020, Increased urinary frequency, - 2014 Cystocele, midline - 06/18/2020, Cystocele, midline, - 2014, Cystocele, - 2014 Bladder disorder, Unspec, Bladder disorder - 2014 Mixed incontinence, Urge and stress incontinence - 2014 Personal Hx Oth female genital tract diseases, History of uterine prolapse - 2014 Rectocele, Rectocele, female - 2014,  Rectocele, - 2014 Urinary Urgency, Urinary urgency - 2014      PMH Notes:  1898-08-18 00:00:00 - Note: Normal Routine History And Physical Adult  2011-06-24 13:36:27 - Note: Chronic Reflux Esophagitis   heart murmur sees Dr. [facility] at [facility] cards last evaled early 2021   NON-GU PMH: Anxiety, Anxiety (Symptom) - 2014 Asthma, Asthma - 2014 Encounter for general adult medical examination without abnormal findings, Normal routine physical examination - 2014 Gastric ulcer, unspecified as acute or chronic, without hemorrhage or perforation, Gastric Ulcer - 2014 Personal history of other diseases of the circulatory system, History of hypertension - 2014 Personal history of other diseases of the musculoskeletal system and connective tissue, History of osteopenia - 2014 Personal history of other mental and behavioral disorders, History of depression - 2014 GERD Glaucoma Hypercholesterolemia Hypertension Sleep Apnea    FAMILY HISTORY: 1 Daughter - Daughter 3 Son's - Son Colon Cancer - Mother, Runs In Family Death In The Family Father - Runs In Family Death In The Family Mother - Runs In Family Diabetes - Mother Family Health Status Number - Runs In Family Hypertension - Mother, Runs In Family Parkinson's Disease - Father renal failure - Father   SOCIAL HISTORY: Marital Status: Married Preferred Language: English; Ethnicity: Not Hispanic Or Latino; Race: White Current Smoking Status: Patient does not smoke anymore. Has not smoked since 06/18/2004. Smoked for 15 years. Smoked 3 packs per day.   Tobacco Use Assessment Completed: Used Tobacco in last 30 days? Has never drank.  Does not use drugs. Drinks 1 caffeinated drink per day. Has not had a blood transfusion. Patient's occupation is/was Retired.     Notes: Former smoker, Marital History - Currently Married, Never Drank Alcohol   REVIEW OF SYSTEMS:    GU Review Female:   Patient reports get up at night to urinate. Patient  denies frequent urination, hard to postpone urination, burning /pain with urination, leakage of urine, stream starts and stops, trouble starting your stream, have to strain to urinate, and being pregnant.  Gastrointestinal (Upper):   Patient reports nausea. Patient denies vomiting and indigestion/ heartburn.  Gastrointestinal (Lower):   Patient reports diarrhea and constipation.   Constitutional:   Patient denies night sweats, fever, weight loss, and fatigue.  Skin:   Patient denies skin rash/ lesion and itching.  Eyes:   Patient denies blurred vision and double vision.  Ears/ Nose/ Throat:   Patient denies sore throat and sinus problems.  Hematologic/Lymphatic:   Patient denies swollen glands and easy bruising.  Cardiovascular:   Patient denies leg swelling and chest pains.  Respiratory:   Patient denies cough and shortness of breath.  Endocrine:   Patient denies excessive thirst.  Musculoskeletal:   Patient reports back pain. Patient denies joint pain.  Neurological:   Patient denies headaches and dizziness.  Psychologic:   Patient denies depression and anxiety.   VITAL SIGNS:      10/23/2020 02:06 PM  Weight 143 lb / 64.86 kg  Height 66 in / 167.64 cm  BP 166/87 mmHg  Pulse 74 /min  Temperature 97.6 F / 36.4  C  BMI 23.1 kg/m   MULTI-SYSTEM PHYSICAL EXAMINATION:    Constitutional: Well-nourished. No physical deformities. Normally developed. Good grooming.  Neck: Neck symmetrical, not swollen. Normal tracheal position.  Respiratory: Normal breath sounds. No labored breathing, no use of accessory muscles.   Cardiovascular: Regular rate and rhythm. No gallop. II/VI sys murmur  Lymphatic: No enlargement of neck, axillae, groin.  Skin: No paleness, no jaundice, no cyanosis. No lesion, no ulcer, no rash.  Neurologic / Psychiatric: Oriented to time, oriented to place, oriented to person. No depression, no anxiety, no agitation.  Gastrointestinal: No mass, no tenderness, no rigidity, non  obese abdomen.  Eyes: Normal conjunctivae. Normal eyelids.  Ears, Nose, Mouth, and Throat: Left ear no scars, no lesions, no masses. Right ear no scars, no lesions, no masses. Nose no scars, no lesions, no masses. Normal hearing. Normal lips.  Musculoskeletal: Normal gait and station of head and neck.     Complexity of Data:  Records Review:   Previous Patient Records  Urine Test Review:   Urinalysis   10/23/20  Urinalysis  Urine Appearance Slightly Cloudy   Urine Color Yellow   Urine Glucose Neg mg/dL  Urine Bilirubin Neg mg/dL  Urine Ketones Neg mg/dL  Urine Specific Gravity 1.015   Urine Blood Neg ery/uL  Urine pH 6.0   Urine Protein Neg mg/dL  Urine Urobilinogen 0.2 mg/dL  Urine Nitrites Neg   Urine Leukocyte Esterase Trace leu/uL  Urine WBC/hpf 0 - 5/hpf   Urine RBC/hpf NS (Not Seen)   Urine Epithelial Cells 6 - 10/hpf   Urine Bacteria Few (10-25/hpf)   Urine Mucous Not Present   Urine Yeast NS (Not Seen)   Urine Trichomonas Not Present   Urine Cystals NS (Not Seen)   Urine Casts NS (Not Seen)   Urine Sperm Not Present    PROCEDURES:          Urinalysis w/Scope - 81001 Dipstick Dipstick Cont'd Micro  Color: Yellow Bilirubin: Neg WBC/hpf: 0 - 5/hpf  Appearance: Slightly Cloudy Ketones: Neg RBC/hpf: NS (Not Seen)  Specific Gravity: 1.015 Blood: Neg Bacteria: Few (10-25/hpf)  pH: 6.0 Protein: Neg Cystals: NS (Not Seen)  Glucose: Neg Urobilinogen: 0.2 Casts: NS (Not Seen)    Nitrites: Neg Trichomonas: Not Present    Leukocyte Esterase: Trace Mucous: Not Present      Epithelial Cells: 6 - 10/hpf      Yeast: NS (Not Seen)      Sperm: Not Present    Notes:      ASSESSMENT:      ICD-10 Details  1 GU:   Female genital prolapse, unspecified - N81.9    PLAN:           Orders Labs Urine Culture          Schedule Return Visit/Planned Activity: Keep Scheduled Appointment - Schedule Surgery          Document Letter(s):  Created for Patient: Clinical Summary          Notes:   There are no changes in the patients history or physical exam since last evaluation by Dr. Louis Meckel. Pt is scheduled to undergo RAL supracervical hysterectomy, bilateral SOO, and sacrocolpopexy on 11/15/20.   Send urine for culture due to bacteria on UA. Will call pt with instructions if positive.   All pt's questions were answered to the best of my ability

## 2020-11-15 NOTE — Interval H&P Note (Signed)
History and Physical Interval Note:  11/15/2020 7:24 AM  Megan [name]  has presented today for surgery, with the diagnosis of PELVIC ORGAN PROLAPSE.  The various methods of treatment have been discussed with the patient and family. After consideration of risks, benefits and other options for treatment, the patient has consented to  Procedure(s) with comments: XI ROBOTIC ASSISTED LAPAROSCOPIC SACROCOLPOPEXY AND SUPRACERVICIAL HYSTERECTOMY BILATERAL SALPINGO-OOPHERECTOMY (Bilateral) - REQUESTING 4.5 HRS as a surgical intervention.  The patient's history has been reviewed, patient examined, no change in status, stable for surgery.  I have reviewed the patient's chart and labs.  Questions were answered to the patient's satisfaction.     Ardis Hughs

## 2020-11-16 ENCOUNTER — Encounter (HOSPITAL_COMMUNITY): Payer: Self-pay | Admitting: Urology

## 2020-11-16 DIAGNOSIS — Z87891 Personal history of nicotine dependence: Secondary | ICD-10-CM | POA: Diagnosis not present

## 2020-11-16 DIAGNOSIS — J45909 Unspecified asthma, uncomplicated: Secondary | ICD-10-CM | POA: Diagnosis not present

## 2020-11-16 DIAGNOSIS — N819 Female genital prolapse, unspecified: Secondary | ICD-10-CM | POA: Diagnosis not present

## 2020-11-16 DIAGNOSIS — Z79899 Other long term (current) drug therapy: Secondary | ICD-10-CM | POA: Diagnosis not present

## 2020-11-16 DIAGNOSIS — I10 Essential (primary) hypertension: Secondary | ICD-10-CM | POA: Diagnosis not present

## 2020-11-16 LAB — SURGICAL PATHOLOGY

## 2020-11-16 LAB — BASIC METABOLIC PANEL
Anion gap: 8 (ref 5–15)
BUN: 7 mg/dL — ABNORMAL LOW (ref 8–23)
CO2: 22 mmol/L (ref 22–32)
Calcium: 8.9 mg/dL (ref 8.9–10.3)
Chloride: 107 mmol/L (ref 98–111)
Creatinine, Ser: 0.52 mg/dL (ref 0.44–1.00)
GFR, Estimated: >60 mL/min (ref >60)
Glucose, Bld: 150 mg/dL — ABNORMAL HIGH (ref 70–99)
Potassium: 4.1 mmol/L (ref 3.5–5.1)
Sodium: 137 mmol/L (ref 135–145)

## 2020-11-16 LAB — HEMOGLOBIN AND HEMATOCRIT, BLOOD
HCT: 36.6 % (ref 36.0–46.0)
Hemoglobin: 12.1 g/dL (ref 12.0–15.0)

## 2020-11-16 MED ORDER — DOCUSATE SODIUM 100 MG PO CAPS: 100.0000 mg | ORAL_CAPSULE | Freq: Two times a day (BID) | ORAL | 2 refills | Status: DC

## 2020-11-16 MED ORDER — ESTRADIOL 0.1 MG/GM VA CREA: 1.0000 | TOPICAL_CREAM | VAGINAL | 1 refills | Status: DC

## 2020-11-16 MED ORDER — CHLORHEXIDINE GLUCONATE CLOTH 2 % EX PADS: 6.0000 | MEDICATED_PAD | Freq: Every day | CUTANEOUS | Status: DC

## 2020-11-16 NOTE — Discharge Summary (Signed)
Date of admission: 11/15/2020  Date of discharge: 11/16/2020  Admission diagnosis: Pelvic organ prolapse   Discharge diagnosis: Pelvic organ prolapse   Secondary diagnoses: None  History and Physical: For full details, please see admission history and physical. Briefly, Megan [name] is a 70 y.o. female with pelvic organ prolapse.   Hospital Course:  She underwent robotic supracervical hysterectomy, bilateral salpingo-oophorectomy, and sacrocolpopexy with Dr. Louis Meckel on 11/15/20. She tolerated the procedure well and was transferred to the floor after receiving routine post-operative care. Her diet was gradually advanced, and her pain was controlled with analgesics.    Her vaginal packing and foley catheter were removed on POD1. She subsequently passed a trial of void. By POD1, she was tolerating a regular diet without nausea/emesis, voiding spontaneously with good urine output, ambulating without difficulty, and having pain well-controlled with oral analgesics. She was deemed appropriate for discharge home.    She was prescribed keflex 500mg  BID x 3 days for surgical prophylaxis.   Physical Exam:  General: Alert and oriented, laying in bed in NAD Cardiac: Regular rate Lungs: NWOB on RA Abdomen: Soft, appropriately tender, nondistended. Incisions intact and covered with dermabond; no signs of infection GU: Voiding spontaneously  Ext: Warm and well-perfused  Laboratory values:  Recent Labs    11/15/20 1145 11/16/20 0434  HGB 13.7 12.1  HCT 41.0 36.6   Recent Labs    11/16/20 0434  CREATININE 0.52    Disposition: Home  Discharge instructions: The patient was instructed to be ambulatory but told to refrain from heavy lifting, strenuous activity, or driving while on narcotics.  Discharge medications:  Allergies as of 11/16/2020       Reactions   Sulfamethoxazole Shortness Of Breath   chest tightness   Amitriptyline Anxiety, Other (See Comments)   Depression, insomnia    Effexor [venlafaxine]    Palpitations, hypertension   Iohexol Other (See Comments)   Passed out   Lactose Intolerance (gi) Diarrhea, Nausea And Vomiting   Lexapro [escitalopram Oxalate]    nausea   Prednisone Palpitations        Medication List     STOP taking these medications    aspirin EC 81 MG tablet   Biotin 1000 MCG tablet   VITAMIN B-12 PO   Vitamin D-3 125 MCG (5000 UT) Tabs   vitamin E 180 MG (400 UNITS) capsule       TAKE these medications    albuterol 108 (90 Base) MCG/ACT inhaler Commonly known as: VENTOLIN HFA Inhale 2 puffs into the lungs every 6 (six) hours as needed for wheezing or shortness of breath.   ALPRAZolam 1 MG tablet Commonly known as: XANAX TAKE 1 TABLET BY MOUTH TWICE A DAY AS NEEDED   atorvastatin 20 MG tablet Commonly known as: LIPITOR Take 1 tablet (20 mg total) by mouth daily.   azelastine 0.1 % nasal spray Commonly known as: ASTELIN Place 2 sprays into both nostrils 2 (two) times daily. Use in each nostril as directed What changed:  when to take this reasons to take this   cephALEXin 500 MG capsule Commonly known as: KEFLEX Take 1 capsule (500 mg total) by mouth 2 (two) times daily.   diltiazem 180 MG 24 hr capsule Commonly known as: Cartia XT Take 1 capsule (180 mg total) by mouth daily.   docusate sodium 100 MG capsule Commonly known as: COLACE Take 1 capsule (100 mg total) by mouth 2 (two) times daily.   docusate sodium 100 MG capsule Commonly known  as: Colace Take 1 capsule (100 mg total) by mouth 2 (two) times daily.   estradiol 0.1 MG/GM vaginal cream Commonly known as: ESTRACE VAGINAL Place 1 Applicatorful vaginally 3 (three) times a week. Use 1 small dolyp of cream on tip of index finger and swap the inside of the vagina   gabapentin 100 MG capsule Commonly known as: NEURONTIN Take 1 capsule (100 mg total) by mouth 3 (three) times daily.   linaclotide 72 MCG capsule Commonly known as: LINZESS Take 72  mcg by mouth daily as needed. constipation   metoprolol succinate 25 MG 24 hr tablet Commonly known as: TOPROL-XL Take 1 tablet (25 mg total) by mouth daily. Please make yearly appt with Dr. Burt Knack for April 2022 for future refills. Thank you 1st attempt   omeprazole 40 MG capsule Commonly known as: PRILOSEC Take 40 mg by mouth in the morning and at bedtime.   ondansetron 4 MG tablet Commonly known as: ZOFRAN Take 4 mg by mouth every 8 (eight) hours as needed for nausea or vomiting.   promethazine 25 MG tablet Commonly known as: PHENERGAN Take 25 mg by mouth every 8 (eight) hours as needed for nausea or vomiting.   sucralfate 1 g tablet Commonly known as: CARAFATE Take 1 g by mouth daily as needed (upset stomach).   tiZANidine 4 MG tablet Commonly known as: Zanaflex Take 1 tablet (4 mg total) by mouth at bedtime.   traMADol 50 MG tablet Commonly known as: Ultram Take 1-2 tablets (50-100 mg total) by mouth every 6 (six) hours as needed for moderate pain or severe pain.        Followup:   Follow-up Information     Hollace Hayward, NP On 11/29/2020.   Why: at 9:30 Contact information: [address] [place] [address] [phone]

## 2020-11-16 NOTE — Progress Notes (Signed)
1 Day Post-Op   Subjective: Overall doing well. Tolerating a CLD without nausea/emesis. Passing flatus. Hasn't ambulated. Pain controlled.   Objective: Vital signs in last 24 hours: Temp:  [96.8 F (36 C)-98.2 F (36.8 C)] 97.9 F (36.6 C) (03/31 2349) Pulse Rate:  [61-79] 78 (03/31 2349) Resp:  [14-21] 14 (03/31 2349) BP: (108-135)/(53-62) 127/53 (03/31 2349) SpO2:  [92 %-100 %] 96 % (03/31 2349) Weight:  [69.1 kg] 69.1 kg (03/31 1515)  Intake/Output from previous day: 03/31 0701 - 04/01 0700 In: 1978 [I.V.:1728; IV Piggyback:250] Out: 1975 [Urine:1450; Blood:25] Intake/Output this shift: No intake/output data recorded.  Physical Exam:  General: Alert and oriented CV: Regular rate Lungs: NWOB on RA Abdomen: Soft, nondistended, appropriately tender. Incisions intact and covered with dermabond; mild surrounding ecchymosis, no signs of infection. GU: Foley in place draining yellow urine; foley was removed at bedside. Vaginal packing removed at bedside  Extremities: Warm and well-perfused   Lab Results: Recent Labs    11/15/20 1145 11/16/20 0434  HGB 13.7 12.1  HCT 41.0 36.6      Assessment/Plan: POD# 1 s/p robotic supracervical hysterectomy, bilateral salpingo-oophorectomy and sacrocolpopexy. Overall doing well. Foley and vaginal packing removed this morning.   - Advance diet as tolerated to regular. Stop IVF. Bowel regimen - IS, encourage ambulation, SCDs - Transition to oral analgesics  - Follow up TOV - EDD later today     LOS: 0 days   Megan [name] 11/16/2020, 7:06 AM

## 2020-11-16 NOTE — Progress Notes (Signed)
Explained discharge orders. Patient and husband endorsed understanding. PIV remove . Patient and husband endorsed understanding. Pushed down by Manuela Schwartz NA to front entrance of hospital.

## 2020-11-17 ENCOUNTER — Telehealth: Payer: Self-pay | Admitting: Urology

## 2020-11-17 NOTE — Telephone Encounter (Signed)
Returned patient call. She reports significant incisional pain and right side pain last night that felt muscle spasms. Her pain is currently controlled this morning. She denies fevers/chills. She is tolerating a regular diet without nausea/emesis and passing flatus.   I recommended that she take regular tylenol 1g q6h for the next several days as well as use heating pads PRN. She reports that she is unable to take ibuprofen due to history of gastritis. I discussed that she can take tramadol 50-100mg  q6h PRN for breakthrough pain. She also has a prescription for flexeril at home. I discussed that it is fine to take this medication as prescribed for muscle spasms.  I recommended that she call back and/or go to the ED if her pain isn't controlled with the above regimen.

## 2020-11-21 ENCOUNTER — Other Ambulatory Visit: Payer: Self-pay

## 2020-11-21 ENCOUNTER — Observation Stay (HOSPITAL_COMMUNITY)
Admission: EM | Admit: 2020-11-21 | Discharge: 2020-11-23 | Disposition: A | Discharge status: Home / Self Care | Payer: Medicare HMO | Attending: Urology | Admitting: Urology

## 2020-11-21 ENCOUNTER — Emergency Department (HOSPITAL_COMMUNITY): Payer: Medicare HMO

## 2020-11-21 ENCOUNTER — Encounter (HOSPITAL_COMMUNITY): Payer: Self-pay

## 2020-11-21 DIAGNOSIS — R339 Retention of urine, unspecified: Principal | ICD-10-CM | POA: Diagnosis present

## 2020-11-21 DIAGNOSIS — Z882 Allergy status to sulfonamides status: Secondary | ICD-10-CM | POA: Insufficient documentation

## 2020-11-21 DIAGNOSIS — R29810 Facial weakness: Secondary | ICD-10-CM | POA: Diagnosis not present

## 2020-11-21 DIAGNOSIS — J90 Pleural effusion, not elsewhere classified: Secondary | ICD-10-CM | POA: Insufficient documentation

## 2020-11-21 DIAGNOSIS — R410 Disorientation, unspecified: Secondary | ICD-10-CM | POA: Diagnosis not present

## 2020-11-21 DIAGNOSIS — I700 Atherosclerosis of aorta: Secondary | ICD-10-CM | POA: Insufficient documentation

## 2020-11-21 DIAGNOSIS — Z20822 Contact with and (suspected) exposure to covid-19: Secondary | ICD-10-CM | POA: Insufficient documentation

## 2020-11-21 DIAGNOSIS — R0902 Hypoxemia: Secondary | ICD-10-CM | POA: Diagnosis not present

## 2020-11-21 DIAGNOSIS — Z87891 Personal history of nicotine dependence: Secondary | ICD-10-CM | POA: Insufficient documentation

## 2020-11-21 DIAGNOSIS — N3289 Other specified disorders of bladder: Secondary | ICD-10-CM | POA: Insufficient documentation

## 2020-11-21 DIAGNOSIS — R2689 Other abnormalities of gait and mobility: Secondary | ICD-10-CM | POA: Diagnosis not present

## 2020-11-21 DIAGNOSIS — N1330 Unspecified hydronephrosis: Secondary | ICD-10-CM | POA: Diagnosis not present

## 2020-11-21 DIAGNOSIS — N390 Urinary tract infection, site not specified: Secondary | ICD-10-CM

## 2020-11-21 DIAGNOSIS — R400 Somnolence: Secondary | ICD-10-CM | POA: Diagnosis present

## 2020-11-21 DIAGNOSIS — J45909 Unspecified asthma, uncomplicated: Secondary | ICD-10-CM | POA: Diagnosis not present

## 2020-11-21 DIAGNOSIS — Z888 Allergy status to other drugs, medicaments and biological substances status: Secondary | ICD-10-CM | POA: Diagnosis not present

## 2020-11-21 DIAGNOSIS — I959 Hypotension, unspecified: Secondary | ICD-10-CM | POA: Diagnosis not present

## 2020-11-21 DIAGNOSIS — R4781 Slurred speech: Secondary | ICD-10-CM | POA: Diagnosis not present

## 2020-11-21 DIAGNOSIS — Z79899 Other long term (current) drug therapy: Secondary | ICD-10-CM | POA: Insufficient documentation

## 2020-11-21 DIAGNOSIS — I10 Essential (primary) hypertension: Secondary | ICD-10-CM | POA: Insufficient documentation

## 2020-11-21 DIAGNOSIS — K5730 Diverticulosis of large intestine without perforation or abscess without bleeding: Secondary | ICD-10-CM | POA: Diagnosis not present

## 2020-11-21 DIAGNOSIS — S3720XA Unspecified injury of bladder, initial encounter: Secondary | ICD-10-CM | POA: Diagnosis present

## 2020-11-21 DIAGNOSIS — R109 Unspecified abdominal pain: Secondary | ICD-10-CM | POA: Diagnosis not present

## 2020-11-21 LAB — LACTIC ACID, PLASMA
Lactic Acid, Venous: 1.1 mmol/L (ref 0.5–1.9)
Lactic Acid, Venous: 5.4 mmol/L (ref 0.5–1.9)
Lactic Acid, Venous: 1.2 mmol/L (ref 0.5–1.9)

## 2020-11-21 LAB — CBC WITH DIFFERENTIAL/PLATELET
Abs Immature Granulocytes: 0.52 10*3/uL — ABNORMAL HIGH (ref 0.00–0.07)
Basophils Absolute: 0.1 10*3/uL (ref 0.0–0.1)
Basophils Relative: 1 %
Eosinophils Absolute: 0.1 10*3/uL (ref 0.0–0.5)
Eosinophils Relative: 0 %
HCT: 33.7 % — ABNORMAL LOW (ref 36.0–46.0)
Hemoglobin: 11.7 g/dL — ABNORMAL LOW (ref 12.0–15.0)
Immature Granulocytes: 2 %
Lymphocytes Relative: 8 %
Lymphs Abs: 2 10*3/uL (ref 0.7–4.0)
MCH: 31.8 pg (ref 26.0–34.0)
MCHC: 34.7 g/dL (ref 30.0–36.0)
MCV: 91.6 fL (ref 80.0–100.0)
Monocytes Absolute: 2.2 10*3/uL — ABNORMAL HIGH (ref 0.1–1.0)
Monocytes Relative: 9 %
Neutro Abs: 21 10*3/uL — ABNORMAL HIGH (ref 1.7–7.7)
Neutrophils Relative %: 80 %
Platelets: 286 10*3/uL (ref 150–400)
RBC: 3.68 MIL/uL — ABNORMAL LOW (ref 3.87–5.11)
RDW: 12.3 % (ref 11.5–15.5)
WBC: 26 10*3/uL — ABNORMAL HIGH (ref 4.0–10.5)
nRBC: 0 % (ref 0.0–0.2)

## 2020-11-21 LAB — BASIC METABOLIC PANEL
Anion gap: 9 (ref 5–15)
BUN: 18 mg/dL (ref 8–23)
CO2: 25 mmol/L (ref 22–32)
Calcium: 8.5 mg/dL — ABNORMAL LOW (ref 8.9–10.3)
Chloride: 92 mmol/L — ABNORMAL LOW (ref 98–111)
Creatinine, Ser: 1.01 mg/dL — ABNORMAL HIGH (ref 0.44–1.00)
GFR, Estimated: 60 mL/min — ABNORMAL LOW (ref >60)
Glucose, Bld: 106 mg/dL — ABNORMAL HIGH (ref 70–99)
Potassium: 4.1 mmol/L (ref 3.5–5.1)
Sodium: 126 mmol/L — ABNORMAL LOW (ref 135–145)

## 2020-11-21 LAB — URINALYSIS, ROUTINE W REFLEX MICROSCOPIC
Bilirubin Urine: NEGATIVE
Glucose, UA: NEGATIVE mg/dL
Ketones, ur: NEGATIVE mg/dL
Nitrite: NEGATIVE
Protein, ur: NEGATIVE mg/dL
Specific Gravity, Urine: 1.003 — ABNORMAL LOW (ref 1.005–1.030)
pH: 6 (ref 5.0–8.0)

## 2020-11-21 LAB — RESP PANEL BY RT-PCR (FLU A&B, COVID) ARPGX2
Influenza A by PCR: NEGATIVE
Influenza B by PCR: NEGATIVE
SARS Coronavirus 2 by RT PCR: NEGATIVE

## 2020-11-21 MED ORDER — SODIUM CHLORIDE 0.9 % IV SOLN: 2.0000 g | Freq: Once | INTRAVENOUS | Status: DC

## 2020-11-21 MED ORDER — ALBUTEROL SULFATE HFA 108 (90 BASE) MCG/ACT IN AERS: 2.0000 | INHALATION_SPRAY | Freq: Four times a day (QID) | RESPIRATORY_TRACT | Status: DC | PRN

## 2020-11-21 MED ORDER — GABAPENTIN 100 MG PO CAPS
100.0000 mg | ORAL_CAPSULE | Freq: Three times a day (TID) | ORAL | Status: DC
  Administered 2020-11-22 – 2020-11-23 (×5): 100 mg via ORAL
  Filled 2020-11-21 (×5): qty 1

## 2020-11-21 MED ORDER — POLYETHYLENE GLYCOL 3350 17 G PO PACK
17.0000 g | PACK | Freq: Every day | ORAL | Status: DC
  Administered 2020-11-22 – 2020-11-23 (×2): 17 g via ORAL
  Filled 2020-11-21 (×2): qty 1

## 2020-11-21 MED ORDER — CEPHALEXIN 500 MG PO CAPS: 500.0000 mg | ORAL_CAPSULE | Freq: Four times a day (QID) | ORAL | 0 refills | Status: DC

## 2020-11-21 MED ORDER — SODIUM CHLORIDE 0.9 % IV SOLN: INTRAVENOUS | Status: DC

## 2020-11-21 MED ORDER — LACTATED RINGERS IV SOLN: INTRAVENOUS | Status: DC

## 2020-11-21 MED ORDER — PANTOPRAZOLE SODIUM 40 MG PO TBEC
80.0000 mg | DELAYED_RELEASE_TABLET | Freq: Every day | ORAL | Status: DC
  Administered 2020-11-22 – 2020-11-23 (×2): 80 mg via ORAL
  Filled 2020-11-21 (×2): qty 2

## 2020-11-21 MED ORDER — METOPROLOL SUCCINATE ER 25 MG PO TB24
25.0000 mg | ORAL_TABLET | Freq: Every day | ORAL | Status: DC
  Administered 2020-11-22 – 2020-11-23 (×2): 25 mg via ORAL
  Filled 2020-11-21 (×2): qty 1

## 2020-11-21 MED ORDER — FENTANYL CITRATE (PF) 100 MCG/2ML IJ SOLN
50.0000 ug | Freq: Once | INTRAMUSCULAR | Status: AC
  Administered 2020-11-21: 50 ug via INTRAVENOUS
  Filled 2020-11-21: qty 2

## 2020-11-21 MED ORDER — IOHEXOL 9 MG/ML PO SOLN
500.0000 mL | ORAL | Status: AC
  Administered 2020-11-21 (×2): 500 mL via ORAL

## 2020-11-21 MED ORDER — IOHEXOL 300 MG/ML  SOLN
50.0000 mL | Freq: Once | INTRAMUSCULAR | Status: AC | PRN
  Administered 2020-11-21: 50 mL

## 2020-11-21 MED ORDER — ATORVASTATIN CALCIUM 20 MG PO TABS
20.0000 mg | ORAL_TABLET | Freq: Every day | ORAL | Status: DC
  Filled 2020-11-21: qty 1
  Filled 2020-11-21: qty 2

## 2020-11-21 MED ORDER — IOHEXOL 300 MG/ML  SOLN: 50.0000 mL | Freq: Once | INTRAMUSCULAR | Status: DC | PRN

## 2020-11-21 MED ORDER — LACTATED RINGERS IV BOLUS (SEPSIS)
500.0000 mL | Freq: Once | INTRAVENOUS | Status: AC
  Administered 2020-11-22: 500 mL via INTRAVENOUS

## 2020-11-21 MED ORDER — TRAMADOL HCL 50 MG PO TABS
50.0000 mg | ORAL_TABLET | Freq: Four times a day (QID) | ORAL | Status: DC | PRN
  Administered 2020-11-23: 50 mg via ORAL
  Filled 2020-11-21: qty 1

## 2020-11-21 MED ORDER — METRONIDAZOLE IN NACL 5-0.79 MG/ML-% IV SOLN: 500.0000 mg | Freq: Once | INTRAVENOUS | Status: DC

## 2020-11-21 MED ORDER — FENTANYL CITRATE (PF) 100 MCG/2ML IJ SOLN
50.0000 ug | Freq: Once | INTRAMUSCULAR | Status: AC
Start: 2020-11-21 — End: 2020-11-21
  Administered 2020-11-21: 50 ug via INTRAVENOUS
  Filled 2020-11-21: qty 2

## 2020-11-21 MED ORDER — ACETAMINOPHEN 500 MG PO TABS: 1000.0000 mg | ORAL_TABLET | Freq: Four times a day (QID) | ORAL | Status: DC | PRN

## 2020-11-21 MED ORDER — IOHEXOL 9 MG/ML PO SOLN
ORAL | Status: AC
  Filled 2020-11-21: qty 500

## 2020-11-21 MED ORDER — TIZANIDINE HCL 4 MG PO TABS
4.0000 mg | ORAL_TABLET | Freq: Every day | ORAL | Status: DC
  Administered 2020-11-22: 4 mg via ORAL
  Filled 2020-11-21: qty 1

## 2020-11-21 MED ORDER — HYDROMORPHONE HCL 1 MG/ML IJ SOLN: 0.5000 mg | INTRAMUSCULAR | Status: DC | PRN

## 2020-11-21 MED ORDER — VANCOMYCIN HCL IN DEXTROSE 1-5 GM/200ML-% IV SOLN: 1000.0000 mg | Freq: Once | INTRAVENOUS | Status: DC

## 2020-11-21 MED ORDER — SODIUM CHLORIDE 0.9 % IV SOLN
1.0000 g | INTRAVENOUS | Status: DC
  Administered 2020-11-21: 1 g via INTRAVENOUS
  Filled 2020-11-21: qty 10

## 2020-11-21 MED ORDER — ONDANSETRON HCL 4 MG/2ML IJ SOLN: 4.0000 mg | INTRAMUSCULAR | Status: DC | PRN

## 2020-11-21 MED ORDER — IOHEXOL 300 MG/ML  SOLN
100.0000 mL | Freq: Once | INTRAMUSCULAR | Status: AC | PRN
  Administered 2020-11-21: 100 mL via INTRAVENOUS

## 2020-11-21 MED ORDER — SODIUM CHLORIDE 0.9 % IV BOLUS
1000.0000 mL | Freq: Once | INTRAVENOUS | Status: AC
  Administered 2020-11-21: 1000 mL via INTRAVENOUS

## 2020-11-21 MED ORDER — DILTIAZEM HCL ER COATED BEADS 180 MG PO CP24
180.0000 mg | ORAL_CAPSULE | Freq: Every day | ORAL | Status: DC
  Administered 2020-11-22 – 2020-11-23 (×2): 180 mg via ORAL
  Filled 2020-11-21 (×2): qty 1

## 2020-11-21 NOTE — ED Triage Notes (Signed)
Patient lives at home with husband. Post op lap/hysterectomy with bladder repair approximately 1 week ago. Reports increased weakness with no appetite x 2-3 days. The patient has been taking tramadol for pain. EMS reports that the patient's husband noticed that the patient had slurred speech yesterday and it subsided later yesterday afternoon. The patient answers all questions appropriately but falls back to sleep quickly. The patient has abrasion noted to her right knee from a recent fall.

## 2020-11-21 NOTE — ED Provider Notes (Addendum)
  Physical Exam  BP (!) 132/55 (BP Location: Left Arm)   Pulse 83   Temp 98.6 F (37 C)   Resp 18   Ht 5\' 6"  (1.676 m)   Wt 63.5 kg   SpO2 98%   BMI 22.60 kg/m   Physical Exam  ED Course/Procedures     .Critical Care Performed by: Varney Biles, MD Authorized by: Varney Biles, MD   Critical care provider statement:    Critical care time (minutes):  74   Critical care was necessary to treat or prevent imminent or life-threatening deterioration of the following conditions:  Metabolic crisis, circulatory failure, renal failure and sepsis   Critical care was time spent personally by me on the following activities:  Discussions with consultants, evaluation of patient's response to treatment, examination of patient, ordering and performing treatments and interventions, ordering and review of laboratory studies, ordering and review of radiographic studies, pulse oximetry, re-evaluation of patient's condition, obtaining history from patient or surrogate and review of old charts    MDM   Assuming care of patient from Dr. Raliegh Ip. Horton   Patient in the ED for weakness, abd pain, drowsiness. She recently had TAH and bladder surgery about a week ago.  Concerning findings are as following: elevated WC. Important pending results are : Ct abd and pelvis.  According to Dr. Dr. Fulton Reek, plan is to f/u on CT scan and reassess.    Patient had no complains, no concerns from the nursing side. Will continue to monitor.     Varney Biles, MD 11/21/20 1537  7:26 PM I assessed the patient around 5 PM.  Her labs had revealed elevated white count with left shift.  On my assessment patient was having significant discomfort in the lower quadrants.  Given that she had a recent TAH, I am concerned that there could be some complication from it including intra-abdominal infection.  CT scan results did come back eventually and there is evidence of bladder defect with fluid around the bladder itself  and mild hydronephrosis.  Patient also has enlarged bladder.  Patient clearly has acute urinary retention.  I discussed the case with urology, they recommend that we get a cystogram.  IV Foley catheter order placed. Results of the ER work-up have been discussed with the patient.   Varney Biles, MD 11/21/20 1928  9:26 PM Patient's lactic acid is 5.4.  It changed from 1.1.  She is already received ceftriaxone and 1 L IV fluid. Given that she has urinary retention with hydronephrosis, questionable bladder leak/injury -I will not be ordering further fluids at this time given the potential harm.  Patient's hemodynamic status is stable, we will closely monitor.  IV fluid will be added if needed.  Sepsis hemodynamic reassessment completed.    Varney Biles, MD 11/21/20 2127 11:09 PM CT cystogram reviewed.  Clear extravasation.  Urologist informed.  They will admit the patient. Lactic acid has gone up.  Will order more IV fluid.  Urologist has okayed IV fluid given that she has a Foley catheter.  I will broaden her antibiotic coverage. Results discussed w/ the patient.   Varney Biles, MD 11/21/20 [phone]

## 2020-11-21 NOTE — ED Notes (Signed)
The patient appears very drowsy but having a conversation with her husband, who is at the bedside

## 2020-11-21 NOTE — ED Notes (Signed)
CRITICAL VALUE STICKER  CRITICAL VALUE: Lactic acid 5.4   RECEIVER (on-site recipient of call): Venise Ellingwood L  DATE & TIME NOTIFIED: 4/6/222  MESSENGER (representative from lab): Ubaldo Glassing   MD NOTIFIED: Kathrynn Humble   TIME OF NOTIFICATION: 2118  RESPONSE:

## 2020-11-21 NOTE — H&P (Addendum)
Urology H&P  Chief Complaint: Drowsiness, abdominal Pain   History of Present Illness: Megan [name] is a 70 y.o. female with history of pelvic organ prolapse s/p robotic supracervical hysterectomy, bilateral salpingo-oophorectomy, and sacrocolpopexy with mesh with Dr. Louis Meckel on 11/15/20. She was discharged on POD1 after an uncomplicated hospital course. She passed a TOV prior to discharge.   She presents to the ED today for drowsiness, weakness, and abdominal pain. She reports decreased appetite but is eating/drinking without difficulty. No nausea/emesis. Reports passing flatus, but no BM since surgery. She reports lower abdominal bloating/pressure since surgery that is somewhat relieved with urination. She reports voiding spontaneously but feels that she isn't emptying completely. Reports gross hematuria right after hospital discharge that resolved. Denies dysuria, flank pain, and fevers/chills. Her lower abdominal bloating/pressure has gradually worsened over the past few days.   She has been afebrile and hemodynamically stable since presentation. Labs are significant for new leukocytosis to 26, elevated lactic acid to 5.4, and AKI to Cr 1.01 from normal baseline. UA with large leukocytes, 6-10 RBC, 21-50 WBC, and rare bacteria. Urine and blood cultures x 2 pending. She received ceftriaxone 1g and 1L NS bolus.   CT A/P with IV and oral contrast showed a possible defect along the posterior urinary bladder with intraperitoneal fluid around the bladder, urinary retention, and mild right hydronephrosis.   A 14 Fr foley catheter was placed in the ED. Subsequent CT cystogram showed an extraperitoneal bladder perforation with mid posterior wall defect.   Past Medical History:  Diagnosis Date  . 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 Allergic state 12/31/2012  . Anxiety   . Asthma    triggerred by fragrances   . Barrett esophagus   . Blind loop syndrome   . C.  difficile diarrhea   . Cancer [facility])    pt denies   . Chest pain, atypical 07/14/2011  . Cold sore 07/17/2016  . Colon polyp   . Contact dermatitis 03/23/2012  . Cystocele, midline   . Depression   . Diarrhea   . Diverticulosis   . Endometrial hyperplasia 02/27/2006   BENIGN ENDO BX ON 02/2007  . GERD (gastroesophageal reflux disease)   . Headache 04/16/2015  . Headache   . Headache(784.0) 04/15/2012   pt denies   . Heart murmur   . Hematuria 04/27/2012  . Hepatic cyst   . Hiatal hernia   . Hiatal hernia   . Hyperlipidemia, mixed 12/17/2014  . Hypertension   . IBS (irritable bowel syndrome)   . Insomnia   . Leaky heart valve   . Low back pain 08/15/2013  . Osteoporosis 03/2017   T score -2.5. Without significant loss from prior studies  . 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 Paronychia of great toe, left 06/19/2013  . Preventative health care 09/11/2014  . Rectal bleeding 10/10/2011  . Rectocele   . 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 Sinusitis acute 02/03/2012  . Thrush 07/21/2011  . 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 Unspecified hypothyroidism   . Unspecified menopausal and postmenopausal disorder   . Uterine prolapse without mention of vaginal wall prolapse   . Vitamin B12 deficiency   . Vitamin D deficiency 07/17/2016    Past Surgical History:  Procedure Laterality Date  . APPENDECTOMY    . endoscopic hemoclip    . eye surgery Left    lens implant  . HYSTEROSCOPY     POLYP  . OPEN REDUCTION INTERNAL FIXATION (ORIF) DISTAL  RADIAL FRACTURE Left 01/17/2019   Procedure: LEFT OPEN REDUCTION INTERNAL FIXATION (ORIF) DISTAL RADIUS FRACTURE;  Surgeon: Daryll Brod, MD;  Location: [place];  Service: Orthopedics;  Laterality: Left;  AXILLARY BLOCK  . PERIPHERAL VASCULAR CATHETERIZATION Left 07/24/2016   Procedure: Renal Angiography;  Surgeon: Conrad [place], MD;  Location: [facility];  Service: Cardiovascular;  Laterality: Left;  . ROBOTIC ASSISTED LAPAROSCOPIC  SACROCOLPOPEXY Bilateral 11/15/2020   Procedure: XI ROBOTIC ASSISTED LAPAROSCOPIC SACROCOLPOPEXY AND SUPRACERVICIAL HYSTERECTOMY BILATERAL SALPINGO-OOPHERECTOMY;  Surgeon: Ardis Hughs, MD;  Location: [facility];  Service: Urology;  Laterality: Bilateral;  REQUESTING 4.5 HRS  . UPPER GI ENDOSCOPY    . UTERINE FIBROID SURGERY      Home Medications:  No current facility-administered medications on file prior to encounter.   Current Outpatient Medications on File Prior to Encounter  Medication Sig Dispense Refill  . albuterol (VENTOLIN HFA) 108 (90 Base) MCG/ACT inhaler Inhale 2 puffs into the lungs every 6 (six) hours as needed for wheezing or shortness of breath. 8 g 0  . ALPRAZolam (XANAX) 1 MG tablet TAKE 1 TABLET BY MOUTH TWICE A DAY AS NEEDED 30 tablet 0  . atorvastatin (LIPITOR) 20 MG tablet Take 1 tablet (20 mg total) by mouth daily. 90 tablet 3  . azelastine (ASTELIN) 0.1 % nasal spray Place 2 sprays into both nostrils 2 (two) times daily. Use in each nostril as directed (Patient taking differently: Place 2 sprays into both nostrils 2 (two) times daily as needed for rhinitis or allergies. Use in each nostril as directed) 30 mL 3  . diltiazem (CARTIA XT) 180 MG 24 hr capsule Take 1 capsule (180 mg total) by mouth daily. 90 capsule 3  . docusate sodium (COLACE) 100 MG capsule Take 1 capsule (100 mg total) by mouth 2 (two) times daily.    Marland Kitchen docusate sodium (COLACE) 100 MG capsule Take 1 capsule (100 mg total) by mouth 2 (two) times daily. 60 capsule 2  . estradiol (ESTRACE VAGINAL) 0.1 MG/GM vaginal cream Place 1 Applicatorful vaginally 3 (three) times a week. Use 1 small dolyp of cream on tip of index finger and swap the inside of the vagina 42.5 g 1  . gabapentin (NEURONTIN) 100 MG capsule Take 1 capsule (100 mg total) by mouth 3 (three) times daily. 90 capsule 3  . linaclotide (LINZESS) 72 MCG capsule Take 72 mcg by mouth daily as needed. constipation    . metoprolol succinate  (TOPROL-XL) 25 MG 24 hr tablet Take 1 tablet (25 mg total) by mouth daily. Please make yearly appt with Dr. Burt Knack for April 2022 for future refills. Thank you 1st attempt 90 tablet 0  . omeprazole (PRILOSEC) 40 MG capsule Take 40 mg by mouth in the morning and at bedtime.    . ondansetron (ZOFRAN) 4 MG tablet Take 4 mg by mouth every 8 (eight) hours as needed for nausea or vomiting.    . promethazine (PHENERGAN) 25 MG tablet Take 25 mg by mouth every 8 (eight) hours as needed for nausea or vomiting.    . sucralfate (CARAFATE) 1 g tablet Take 1 g by mouth daily as needed (upset stomach).    Marland Kitchen tiZANidine (ZANAFLEX) 4 MG tablet Take 1 tablet (4 mg total) by mouth at bedtime. 30 tablet 0  . traMADol (ULTRAM) 50 MG tablet Take 1-2 tablets (50-100 mg total) by mouth every 6 (six) hours as needed for moderate pain or severe pain. 20 tablet 0  Allergies:  Allergies  Allergen Reactions  . Sulfamethoxazole Shortness Of Breath    chest tightness  . Amitriptyline Anxiety and Other (See Comments)    Depression, insomnia  . Effexor [Venlafaxine]     Palpitations, hypertension  . Lactose Intolerance (Gi) Diarrhea and Nausea And Vomiting  . Lexapro [Escitalopram Oxalate]     nausea  . Prednisone Palpitations    Family History  Problem Relation Age of Onset  . Colon cancer Mother   . Diabetes Mother   . Hypertension Mother   . Colon polyps Father   . Parkinson's disease Father   . Colon cancer Maternal Grandmother   . Breast cancer Maternal Grandmother 87  . Diabetes Paternal Grandmother   . Breast cancer Paternal Grandmother 79  . Allergies Sister   . Arthritis Brother        b/l hip replace  . Alcohol abuse Daughter   . Arthritis Son   . Hypertension Son   . Allergies Son   . Osteoporosis Son   . Osteoporosis Son   . Arthritis Son        back disease, DDD  . Allergies Son   . Arthritis Son   . Irritable bowel syndrome Son        RSD  . Hypertension Paternal Grandfather   .  Heart disease Paternal Grandfather   . Aneurysm Paternal Grandfather   . Colon cancer Maternal Aunt   . Colon cancer Cousin        X3  . Ovarian cancer Cousin     Social History:  reports that she quit smoking about 25 years ago. She has never used smokeless tobacco. She reports that she does not drink alcohol and does not use drugs.  ROS: A complete review of systems was performed.  All systems are negative except for pertinent findings as noted.  Physical Exam:  Vital signs in last 24 hours: Temp:  [97.3 F (36.3 C)-98.6 F (37 C)] 97.3 F (36.3 C) (04/06 1702) Pulse Rate:  [81-98] 94 (04/06 1830) Resp:  [14-21] 17 (04/06 1830) BP: (112-152)/(54-80) 147/64 (04/06 1830) SpO2:  [91 %-100 %] 99 % (04/06 1830) Weight:  [63.5 kg] 63.5 kg (04/06 1225) Constitutional:  Alert and oriented, no acute distress Cardiovascular: Regular rate  Respiratory: Normal respiratory effort on RA GI: Abdomen is soft, moderate distended, suprapubic tenderness (foley catheter had just been placed and still draining), not peritonitic. Incisions intact with mild surrounding ecchymosis; no signs of infection GU: No CVA tenderness. 14 Fr foley catheter in place draining clear yellow urine Neurologic: Grossly intact, no focal deficits Psychiatric: Normal mood and affect  Laboratory Data:  Recent Labs    11/21/20 1325  WBC 26.0*  HGB 11.7*  HCT 33.7*  PLT 286    Recent Labs    11/21/20 1325  NA 126*  K 4.1  CL 92*  GLUCOSE 106*  BUN 18  CALCIUM 8.5*  CREATININE 1.01*     Results for orders placed or performed during the hospital encounter of 11/21/20 (from the past 24 hour(s))  Urinalysis, Routine w reflex microscopic Urine, Clean Catch     Status: Abnormal   Collection Time: 11/21/20  1:17 PM  Result Value Ref Range   Color, Urine STRAW (A) YELLOW   APPearance HAZY (A) CLEAR   Specific Gravity, Urine 1.003 (L) 1.005 - 1.030   pH 6.0 5.0 - 8.0   Glucose, UA NEGATIVE NEGATIVE mg/dL    Hgb urine dipstick LARGE (A) NEGATIVE  Bilirubin Urine NEGATIVE NEGATIVE   Ketones, ur NEGATIVE NEGATIVE mg/dL   Protein, ur NEGATIVE NEGATIVE mg/dL   Nitrite NEGATIVE NEGATIVE   Leukocytes,Ua LARGE (A) NEGATIVE   RBC / HPF 6-10 0 - 5 RBC/hpf   WBC, UA 21-50 0 - 5 WBC/hpf   Bacteria, UA RARE (A) NONE SEEN   Squamous Epithelial / LPF 0-5 0 - 5  CBC with Differential     Status: Abnormal   Collection Time: 11/21/20  1:25 PM  Result Value Ref Range   WBC 26.0 (H) 4.0 - 10.5 K/uL   RBC 3.68 (L) 3.87 - 5.11 MIL/uL   Hemoglobin 11.7 (L) 12.0 - 15.0 g/dL   HCT 33.7 (L) 36.0 - 46.0 %   MCV 91.6 80.0 - 100.0 fL   MCH 31.8 26.0 - 34.0 pg   MCHC 34.7 30.0 - 36.0 g/dL   RDW 12.3 11.5 - 15.5 %   Platelets 286 150 - 400 K/uL   nRBC 0.0 0.0 - 0.2 %   Neutrophils Relative % 80 %   Neutro Abs 21.0 (H) 1.7 - 7.7 K/uL   Lymphocytes Relative 8 %   Lymphs Abs 2.0 0.7 - 4.0 K/uL   Monocytes Relative 9 %   Monocytes Absolute 2.2 (H) 0.1 - 1.0 K/uL   Eosinophils Relative 0 %   Eosinophils Absolute 0.1 0.0 - 0.5 K/uL   Basophils Relative 1 %   Basophils Absolute 0.1 0.0 - 0.1 K/uL   WBC Morphology MILD LEFT SHIFT (1-5% METAS, OCC MYELO, OCC BANDS)    Immature Granulocytes 2 %   Abs Immature Granulocytes 0.52 (H) 0.00 - 0.07 K/uL  Basic metabolic panel     Status: Abnormal   Collection Time: 11/21/20  1:25 PM  Result Value Ref Range   Sodium 126 (L) 135 - 145 mmol/L   Potassium 4.1 3.5 - 5.1 mmol/L   Chloride 92 (L) 98 - 111 mmol/L   CO2 25 22 - 32 mmol/L   Glucose, Bld 106 (H) 70 - 99 mg/dL   BUN 18 8 - 23 mg/dL   Creatinine, Ser 1.01 (H) 0.44 - 1.00 mg/dL   Calcium 8.5 (L) 8.9 - 10.3 mg/dL   GFR, Estimated 60 (L) >60 mL/min   Anion gap 9 5 - 15  Lactic acid, plasma     Status: None   Collection Time: 11/21/20  4:45 PM  Result Value Ref Range   Lactic Acid, Venous 1.1 0.5 - 1.9 mmol/L   *Note: Due to a large number of results and/or encounters for the requested time period, some results  have not been displayed. A complete set of results can be found in Results Review.   Recent Results (from the past 240 hour(s))  SARS CORONAVIRUS 2 (TAT 6-24 HRS) Nasopharyngeal Nasopharyngeal Swab     Status: None   Collection Time: 11/12/20 10:18 AM   Specimen: Nasopharyngeal Swab  Result Value Ref Range Status   SARS Coronavirus 2 NEGATIVE NEGATIVE Final    Comment: (NOTE) SARS-CoV-2 target nucleic acids are NOT DETECTED.  The SARS-CoV-2 RNA is generally detectable in upper and lower respiratory specimens during the acute phase of infection. Negative results do not preclude SARS-CoV-2 infection, do not rule out co-infections with other pathogens, and should not be used as the sole basis for treatment or other patient management decisions. Negative results must be combined with clinical observations, patient history, and epidemiological information. The expected result is Negative.  Fact Sheet for Patients: SugarRoll.be  Fact Sheet  for Healthcare Providers: https://www.woods-mathews.com/  This test is not yet approved or cleared by the [place] and  has been authorized for detection and/or diagnosis of SARS-CoV-2 by FDA under an Emergency Use Authorization (EUA). This EUA will remain  in effect (meaning this test can be used) for the duration of the COVID-19 declaration under Se ction 564(b)(1) of the Act, 21 U.S.C. section 360bbb-3(b)(1), unless the authorization is terminated or revoked sooner.  Performed at [facility], [place] [address], [place], [place] [address]     Renal Function: Recent Labs    11/16/20 0434 11/21/20 1325  CREATININE 0.52 1.01*   Estimated Creatinine Clearance: 48.5 mL/min (A) (by C-G formula based on SCr of 1.01 mg/dL (H)).  Radiologic Imaging: CT Abdomen Pelvis W Contrast  Result Date: 11/21/2020 CLINICAL DATA:  Abdominal pain, acute abdominal pain in a 70 year old female. EXAM: CT  ABDOMEN AND PELVIS WITH CONTRAST TECHNIQUE: Multidetector CT imaging of the abdomen and pelvis was performed using the standard protocol following bolus administration of intravenous contrast. CONTRAST:  1100mL OMNIPAQUE IOHEXOL 300 MG/ML  SOLN COMPARISON:  August 07, 2020 FINDINGS: Lower chest: Small bilateral effusions and patchy airspace disease, in the dependent chest Hepatobiliary: Stable hepatic cyst. No pericholecystic stranding. No biliary duct dilation. Pancreas: Mild pancreatic atrophy. Spleen: Spleen normal size and contour. Adrenals/Urinary Tract: Adrenal glands with stable well-circumscribed adrenal nodule that measured 146 Hounsfield units on the previous exam post-contrast, 82 Hounsfield units on today's study, 1.4 cm greatest axial dimension. Unchanged. Symmetric renal enhancement. Mild RIGHT ureteral distension and collecting system distension. Minimal LEFT distension. Marked distension of the urinary bladder. Fluid about the bladder base, along the course of the LEFT ureter in the LEFT pelvis measuring approximately 5.4 x 4.9 cm, contiguous with fluid density in the extraperitoneal spaces of the pelvis but tracking towards the ileum and RIGHT lower quadrant and along the top of the urinary bladder. Fascial enhancement in the space of Retzius where fluid extends along approximately 12 cm by 1.5 cm of the anterior lower pelvis. No signs of ascites in the upper abdomen. Enhancement of the urinary bladder wall with subtle areas of limited enhancement seen along the margin of the bladder wall in this patient with reported history of bladder injury at the time of hysterectomy. Defect in bladder wall enhancement seen on image 98 of series 5. Density of fluid is simple fluid density. Urinary bladder is markedly distended with fluid tracking mainly on the RIGHT and posterior of the urinary bladder in addition to anteriorly in the space of Retzius. Stomach/Bowel: Stomach with small hiatal hernia. Large  duodenal diverticulum in the periampullary region of the duodenum. Secondary inflammation of the ileum with fluid adjacent to it displacing the terminal ileum in the RIGHT lower quadrant. This is the only area where the possibility of intraperitoneal extension is entertained though this may be tracking along RIGHT pelvic sidewall and into the lower margin of the posterior pararenal space based on position of adjacent ureter and ovarian vein. The distal ileum was in close proximity to the RIGHT ovary on previous imaging perhaps closely related to the broad ligament. No acute colonic process. No extravasation of ingested contrast media. Vascular/Lymphatic: Calcified atheromatous plaque in the abdominal aorta. No aneurysmal dilation. Smooth contour of the IVC. There is no gastrohepatic or hepatoduodenal ligament lymphadenopathy. No retroperitoneal or mesenteric lymphadenopathy. No pelvic sidewall lymphadenopathy. Reproductive: Post hysterectomy by report, potentially supracervical. Also with bilateral sacral colpopexy by report. Other: No ascites about the  liver or the RIGHT or LEFT paracolic gutter. No free air. Musculoskeletal: No acute bone finding. No destructive bone process. Spinal degenerative changes. IMPRESSION: 1. Suspected defect in the posterior urinary bladder with intraperitoneal fluid about the urinary bladder and signs of urinary retention. Urologic consultation is suggested for decompression of the urinary bladder and further assessment a bladder leak. 2. Position of distal RIGHT ureter with respect to the suspected bladder wall defect does not allow for complete exclusion of distal ureteral injury attention on follow-up is suggested. 3. Mild RIGHT greater than LEFT hydronephrosis likely related to markedly distended urinary bladder. 4. Small bilateral effusions and patchy airspace disease, irregular appearance raises the question of infection. Correlate with any respiratory symptoms. 5. Stable 1.4 cm  well-circumscribed nodule in the right adrenal gland. This measured 146 Hounsfield units on the previous exam post-contrast, 82 Hounsfield units on today's study. Given density on the previous imaging study, follow-up adrenal protocol may be helpful as pheochromocytoma can show increased density on venous phase, greater than 130 Hounsfield units on the prior study, therefore, biochemical correlation may also be warranted. 6. Aortic atherosclerosis. These results were called by telephone at the time of interpretation on 11/21/2020 at 6:46 pm to provider Dr. Kathrynn Humble, who verbally acknowledged these results. Aortic Atherosclerosis (ICD10-I70.0). Electronically Signed   By: Zetta Bills M.D.   On: 11/21/2020 18:40    Impression/Assessment:  DALEYSSA LOISELLE is a 70 y.o. female with history of pelvic organ prolapse s/p uncomplicated robotic supracervical hysterectomy, bilateral salpingo-oophorectomy, and sacrocolpopexy with mesh with Dr. Louis Meckel on 11/15/20 who presents to the ED today with worsening abdominal bloating/pressure and weakness with acute urinary retention and possible bladder injury on imaging, for which a foley catheter was placed. CT cystogram confirmed an extraperitoneal bladder injury with mid posterior wall defect.   She is currently clinically stable and well-appearing on physical exam. Given extraperitoneal injury, we will manage her bladder injury with bladder decompression and broad spectrum antibiotics.   Plan:  - Admit to urology - Continue bladder decompression with foley catheter. Will likely need a foley catheter for 10-14 days with cystogram prior to removal to ensure bladder injury has healed  - Broaden antibiotics to vanc/cefepime.  Follow up urine and blood cultures. Narrow antibiotics accordingly - NPO overnight in case of possible procedure tomorrow, NS 125 cc/hr - Trend lactic acidosis - PRN analgesics and antiemetics - Continue home meds - Daily labs - SCDs, IS,  encourage ambulation  Megan [name] 11/21/2020, 7:42 PM

## 2020-11-21 NOTE — Sepsis Progress Note (Signed)
eLink is monitoring this Code Sepsis. °

## 2020-11-21 NOTE — Progress Notes (Signed)
Pharmacy Antibiotic Note  Megan [name] is a 70 y.o. female admitted on 11/21/2020  with history of pelvic organ prolapse s/p robotic supracervical hysterectomy, bilateral salpingo-oophorectomy, and sacrocolpopexy with mesh with Dr. Louis Meckel on3/31/22.  Pt presented to ED with drowsiness, weakness and abd pain  Pharmacy has been consulted for vancomycin and cefepime dosing.  Plan: Vancomycin 1gm IV x 1 then 1gm q24h (AUC 485.3, Scr 1.0) Cefepime 2gm IV q12h Follow renal function, cultures and clinical course  Height: 5\' 6"  (167.6 cm) Weight: 63.5 kg (140 lb) IBW/kg (Calculated) : 59.3  Temp (24hrs), Avg:98.8 F (37.1 C), Min:97.3 F (36.3 C), Max:99.6 F (37.6 C)  Recent Labs  Lab 11/16/20 0434 11/21/20 1325 11/21/20 1645 11/21/20 2010  WBC  --  26.0*  --   --   CREATININE 0.52 1.01*  --   --   LATICACIDVEN  --   --  1.1 5.4*    Estimated Creatinine Clearance: 48.5 mL/min (A) (by C-G formula based on SCr of 1.01 mg/dL (H)).    Allergies  Allergen Reactions  . Sulfamethoxazole Shortness Of Breath    chest tightness  . Amitriptyline Anxiety and Other (See Comments)    Depression, insomnia  . Effexor [Venlafaxine]     Palpitations, hypertension  . Lactose Intolerance (Gi) Diarrhea and Nausea And Vomiting  . Lexapro [Escitalopram Oxalate]     nausea  . Prednisone Palpitations    Antimicrobials this admission: 4/7 vanc >> 4/7 cefepime >> 4/6 CTX >> 4/6 flagyl x 1  Dose adjustments this admission:   Microbiology results: 4/6 BCx:  4/6 UCx:    Thank you for allowing pharmacy to be a part of this patient's care.  Dolly Rias RPh 11/21/2020, 10:47 PM

## 2020-11-21 NOTE — ED Provider Notes (Addendum)
[place] DEPT Provider Note   CSN: [id number] Arrival date & time: 11/21/20  1200     History Chief Complaint  Patient presents with  . Weakness    Patient lives at home with husband.  Post op lap/hysterectomy with bladder repair approximately 1 week ago.  Reports increased weakness with no appetite x 2-3 days. The patient has been taking tramadol for pain.  EMS reports that the patient's husband noticed that the patient had slurred speech yesterday and it subsided later yesterday afternoon.  The patient answers all questions appropriately but falls back to sleep quickly.  The patient has abrasion noted to her right knee from a recent fall.    Megan [name] is a 70 y.o. female.  HPI   70 year old female presents the emergency department for concern of a drowsiness.  Patient is postop 1 week from robotic laparoscopic sacrocolpopexy and total hysterectomy by Dr. Louis Meckel.  She was discharged home without complication and sent with tramadol for pain control.  Patient states that she has been taking the 100 mg every 3 hours as directed.  Today her children felt like she was "more sleepy than usual".  Patient states that she feels exhausted and does admit that the tramadol medication makes her sleepy.  She has been passing urine without difficulty.  Denies any hematuria.  Denies any fever, nausea/vomiting, diarrhea.  She had a decreased appetite but is eating and drinking without difficulty.  Past Medical History:  Diagnosis Date  . 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 Adrenal gland cyst ([place]) 05/22/2014  . Allergic state 12/31/2012  . Anxiety   . Asthma    triggerred by fragrances   . Barrett esophagus   . Blind loop syndrome   . C. difficile diarrhea   . Cancer [facility])    pt denies   . Chest pain, atypical 07/14/2011  . Cold sore 07/17/2016  . Colon polyp   . Contact dermatitis 03/23/2012  . Cystocele, midline   . Depression   . Diarrhea   . Diverticulosis    . Endometrial hyperplasia 02/27/2006   BENIGN ENDO BX ON 02/2007  . GERD (gastroesophageal reflux disease)   . Headache 04/16/2015  . Headache   . Headache(784.0) 04/15/2012   pt denies   . Heart murmur   . Hematuria 04/27/2012  . Hepatic cyst   . Hiatal hernia   . Hiatal hernia   . Hyperlipidemia, mixed 12/17/2014  . Hypertension   . IBS (irritable bowel syndrome)   . Insomnia   . Leaky heart valve   . Low back pain 08/15/2013  . Osteoporosis 03/2017   T score -2.5. Without significant loss from prior studies  . 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 Paronychia of great toe, left 06/19/2013  . Preventative health care 09/11/2014  . Rectal bleeding 10/10/2011  . Rectocele   . 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 Sinusitis acute 02/03/2012  . Thrush 07/21/2011  . 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 Unspecified hypothyroidism   . Unspecified menopausal and postmenopausal disorder   . Uterine prolapse without mention of vaginal wall prolapse   . Vitamin B12 deficiency   . Vitamin D deficiency 07/17/2016    Patient Active Problem List   Diagnosis Date Noted  . Cystocele with prolapse 11/15/2020  . Asthma 11/05/2020  . Uterine prolapse 11/05/2020  . Episodic cluster headache, not intractable 09/14/2020  . Chronic left-sided headache 08/09/2020  . Cerebral vascular disease 08/09/2020  . Depression with anxiety 08/09/2020  .  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 Closed fracture of right distal radius 01/12/2019  . Moderate obstructive sleep apnea 05/21/2017  . Vitamin D deficiency 07/17/2016  . Abdominal aortic atherosclerosis ([place]) 07/16/2016  . Renal artery stenosis ([place]) 06/12/2016  . Chronic low back pain 08/24/2015  . Hyperlipidemia, mixed 12/17/2014  . Hepatic cyst 05/22/2014  . Insomnia 03/10/2014  . 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 Osteoporosis 09/07/2012  . 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 Tricuspid regurgitation, Mild-Moderate 07/21/2011  . Mitral  regurgitation, Trivial 07/21/2011  . Gastroesophageal reflux disease with hiatal hernia 06/17/2011  . Endometrial hyperplasia   . Diverticulosis   . IBS (irritable bowel syndrome) with constipation 04/24/2011  . Barrett's esophagus 03/04/2011  . Hiatal hernia 03/04/2011  . Basal cell carcinoma (BCC) of face 11/15/2009  . Vitamin B12 deficiency 02/15/2009  . 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 Essential hypertension 01/04/2009    Past Surgical History:  Procedure Laterality Date  . APPENDECTOMY    . endoscopic hemoclip    . eye surgery Left    lens implant  . HYSTEROSCOPY     POLYP  . OPEN REDUCTION INTERNAL FIXATION (ORIF) DISTAL RADIAL FRACTURE Left 01/17/2019   Procedure: LEFT OPEN REDUCTION INTERNAL FIXATION (ORIF) DISTAL RADIUS FRACTURE;  Surgeon: Daryll Brod, MD;  Location: [facility];  Service: Orthopedics;  Laterality: Left;  AXILLARY BLOCK  . PERIPHERAL VASCULAR CATHETERIZATION Left 07/24/2016   Procedure: Renal Angiography;  Surgeon: Conrad [place], MD;  Location: [facility];  Service: Cardiovascular;  Laterality: Left;  . ROBOTIC ASSISTED LAPAROSCOPIC SACROCOLPOPEXY Bilateral 11/15/2020   Procedure: XI ROBOTIC ASSISTED LAPAROSCOPIC SACROCOLPOPEXY AND SUPRACERVICIAL HYSTERECTOMY BILATERAL SALPINGO-OOPHERECTOMY;  Surgeon: Ardis Hughs, MD;  Location: [facility];  Service: Urology;  Laterality: Bilateral;  REQUESTING 4.5 HRS  . UPPER GI ENDOSCOPY    . UTERINE FIBROID SURGERY       OB History    Gravida  6   Para  4   Term      Preterm      AB  2   Living  4     SAB      IAB      Ectopic      Multiple      Live Births              Family History  Problem Relation Age of Onset  . Colon cancer Mother   . Diabetes Mother   . Hypertension Mother   . Colon polyps Father   . Parkinson's disease Father   . Colon cancer Maternal Grandmother   . Breast cancer Maternal Grandmother 43  . Diabetes Paternal Grandmother   . Breast cancer  Paternal Grandmother 66  . Allergies Sister   . Arthritis Brother        b/l hip replace  . Alcohol abuse Daughter   . Arthritis Son   . Hypertension Son   . Allergies Son   . Osteoporosis Son   . Osteoporosis Son   . Arthritis Son        back disease, DDD  . Allergies Son   . Arthritis Son   . Irritable bowel syndrome Son        RSD  . Hypertension Paternal Grandfather   . Heart disease Paternal Grandfather   . Aneurysm Paternal Grandfather   . Colon cancer Maternal Aunt   . Colon cancer Cousin        X3  . Ovarian cancer Cousin  Social History   Tobacco Use  . Smoking status: Former Smoker    Quit date: 08/19/1995    Years since quitting: 25.2  . Smokeless tobacco: Never Used  Vaping Use  . Vaping Use: Never used  Substance Use Topics  . Alcohol use: Never    Alcohol/week: 0.0 standard drinks  . Drug use: Never    Home Medications Prior to Admission medications   Medication Sig Start Date End Date Taking? Authorizing Provider  albuterol (VENTOLIN HFA) 108 (90 Base) MCG/ACT inhaler Inhale 2 puffs into the lungs every 6 (six) hours as needed for wheezing or shortness of breath. 06/17/19   Brunetta Jeans, PA-C  ALPRAZolam Duanne Moron) 1 MG tablet TAKE 1 TABLET BY MOUTH TWICE A DAY AS NEEDED 10/30/20   Midge Minium, MD  atorvastatin (LIPITOR) 20 MG tablet Take 1 tablet (20 mg total) by mouth daily. 11/05/20   Haydee Salter, MD  azelastine (ASTELIN) 0.1 % nasal spray Place 2 sprays into both nostrils 2 (two) times daily. Use in each nostril as directed Patient taking differently: Place 2 sprays into both nostrils 2 (two) times daily as needed for rhinitis or allergies. Use in each nostril as directed 07/21/20   Hassell Done, Mary-Margaret, FNP  cephALEXin (KEFLEX) 500 MG capsule Take 1 capsule (500 mg total) by mouth 2 (two) times daily. 11/15/20   Debbrah Alar, PA-C  diltiazem (CARTIA XT) 180 MG 24 hr capsule Take 1 capsule (180 mg total) by mouth daily. 12/14/19 12/08/20   Sherren Mocha, MD  docusate sodium (COLACE) 100 MG capsule Take 1 capsule (100 mg total) by mouth 2 (two) times daily. 11/15/20   Debbrah Alar, PA-C  docusate sodium (COLACE) 100 MG capsule Take 1 capsule (100 mg total) by mouth 2 (two) times daily. 11/16/20 11/16/21  Ardis Hughs, MD  estradiol (ESTRACE VAGINAL) 0.1 MG/GM vaginal cream Place 1 Applicatorful vaginally 3 (three) times a week. Use 1 small dolyp of cream on tip of index finger and swap the inside of the vagina 11/16/20   Ardis Hughs, MD  gabapentin (NEURONTIN) 100 MG capsule Take 1 capsule (100 mg total) by mouth 3 (three) times daily. 08/01/20   Brunetta Jeans, PA-C  linaclotide [facility]) 72 MCG capsule Take 72 mcg by mouth daily as needed. constipation 12/16/18   [provider]  metoprolol succinate (TOPROL-XL) 25 MG 24 hr tablet Take 1 tablet (25 mg total) by mouth daily. Please make yearly appt with Dr. Burt Knack for April 2022 for future refills. Thank you 1st attempt 10/04/20   Sherren Mocha, MD  omeprazole (PRILOSEC) 40 MG capsule Take 40 mg by mouth in the morning and at bedtime. 04/30/20   [provider]  ondansetron (ZOFRAN) 4 MG tablet Take 4 mg by mouth every 8 (eight) hours as needed for nausea or vomiting. 06/06/19   [provider]  promethazine (PHENERGAN) 25 MG tablet Take 25 mg by mouth every 8 (eight) hours as needed for nausea or vomiting. 05/22/20   [provider]  sucralfate (CARAFATE) 1 g tablet Take 1 g by mouth daily as needed (upset stomach).    [provider]  tiZANidine (ZANAFLEX) 4 MG tablet Take 1 tablet (4 mg total) by mouth at bedtime. 06/26/20   Brunetta Jeans, PA-C  traMADol (ULTRAM) 50 MG tablet Take 1-2 tablets (50-100 mg total) by mouth every 6 (six) hours as needed for moderate pain or severe pain. 11/15/20   Debbrah Alar, PA-C  Allergies    Sulfamethoxazole, Amitriptyline, Effexor [venlafaxine], Iohexol, Lactose intolerance (gi), Lexapro  [escitalopram oxalate], and Prednisone  Review of Systems   Review of Systems  Constitutional: Positive for appetite change and fatigue. Negative for chills and fever.  HENT: Negative for congestion.   Eyes: Negative for visual disturbance.  Respiratory: Negative for shortness of breath.   Cardiovascular: Negative for chest pain.  Gastrointestinal: Negative for abdominal pain, diarrhea and vomiting.  Genitourinary: Negative for difficulty urinating, dysuria, flank pain, hematuria, pelvic pain and vaginal bleeding.  Skin: Negative for rash.  Neurological: Negative for headaches.    Physical Exam Updated Vital Signs BP (!) 121/56   Pulse 83   Temp 98.6 F (37 C)   Resp 14   Ht 5\' 6"  (1.676 m)   Wt 63.5 kg   SpO2 95%   BMI 22.60 kg/m   Physical Exam Vitals and nursing note reviewed.  Constitutional:      Appearance: Normal appearance.  HENT:     Head: Normocephalic.     Mouth/Throat:     Mouth: Mucous membranes are moist.  Cardiovascular:     Rate and Rhythm: Normal rate.  Pulmonary:     Effort: Pulmonary effort is normal. No respiratory distress.  Abdominal:     Palpations: Abdomen is soft.     Tenderness: There is no abdominal tenderness. There is no guarding or rebound.     Comments: Laparoscopic sites appear to be healing well without any signs of cellulitis/drainage  Musculoskeletal:        General: No swelling.  Skin:    General: Skin is warm.  Neurological:     Mental Status: She is alert and oriented to person, place, and time. Mental status is at baseline.  Psychiatric:        Mood and Affect: Mood normal.     ED Results / Procedures / Treatments   Labs (all labs ordered are listed, but only abnormal results are displayed) Labs Reviewed  CBC WITH DIFFERENTIAL/PLATELET  BASIC METABOLIC PANEL  URINALYSIS, ROUTINE W REFLEX MICROSCOPIC    EKG None  Radiology No results found.  Procedures Procedures   Medications Ordered in ED Medications -  No data to display  ED Course  I have reviewed the triage vital signs and the nursing notes.  Pertinent labs & imaging results that were available during my care of the patient were reviewed by me and considered in my medical decision making (see chart for details).    MDM Rules/Calculators/A&P                          70 year old female presents the emergency department increased drowsiness.  She is status postop 1 week from a laparoscopic hysterectomy and sacrocolpopexy.  Patient admits that she has been taking the tramadol medication and this does make her tired/drowsy.  She denies any acute symptoms.  She is afebrile on arrival, neuro intact and oriented.  Plan for lab evaluation.  Urinalysis shows leukocytes and bacteria, nitrate negative.  Suspicious for UTI, urine culture sent.  She has an elevated white count at 26 and a mild hyponatremia.  Given her tender abdomen, borderline UA and high white count plan will be for CT of the abdomen pelvis to rule out any postop complication/infection.    Patient has a documented allergy to iohexol.  Her allergy is marked as passed out.  Patient states this happened to her once when she was in her 42s.  Since then she has had CAT scans with IV contrast without any pretreatment or allergy.  I spoke with the radiology team and they are okay doing a CAT scan with IV contrast, I have contacted pharmacy and the nursing staff to have the iohexol allergy removed.  Patient is pending CAT scan, if this shows no postoperative complication patient would be suitable for discharge and outpatient treatment of UTI.  Final Clinical Impression(s) / ED Diagnoses Final diagnoses:  None    Rx / DC Orders ED Discharge Orders    None       Lorelle Gibbs, DO 11/21/20 1537    Georjean Toya, Alvin Critchley, DO 11/21/20 1540

## 2020-11-22 DIAGNOSIS — S3720XA Unspecified injury of bladder, initial encounter: Secondary | ICD-10-CM | POA: Diagnosis present

## 2020-11-22 LAB — BASIC METABOLIC PANEL
Anion gap: 9 (ref 5–15)
BUN: 9 mg/dL (ref 8–23)
CO2: 23 mmol/L (ref 22–32)
Calcium: 7.9 mg/dL — ABNORMAL LOW (ref 8.9–10.3)
Chloride: 101 mmol/L (ref 98–111)
Creatinine, Ser: 0.73 mg/dL (ref 0.44–1.00)
GFR, Estimated: >60 mL/min (ref >60)
Glucose, Bld: 104 mg/dL — ABNORMAL HIGH (ref 70–99)
Potassium: 3.4 mmol/L — ABNORMAL LOW (ref 3.5–5.1)
Sodium: 133 mmol/L — ABNORMAL LOW (ref 135–145)

## 2020-11-22 LAB — CBC
HCT: 27.6 % — ABNORMAL LOW (ref 36.0–46.0)
Hemoglobin: 9.6 g/dL — ABNORMAL LOW (ref 12.0–15.0)
MCH: 31.9 pg (ref 26.0–34.0)
MCHC: 34.8 g/dL (ref 30.0–36.0)
MCV: 91.7 fL (ref 80.0–100.0)
Platelets: 269 10*3/uL (ref 150–400)
RBC: 3.01 MIL/uL — ABNORMAL LOW (ref 3.87–5.11)
RDW: 12.4 % (ref 11.5–15.5)
WBC: 16.5 10*3/uL — ABNORMAL HIGH (ref 4.0–10.5)
nRBC: 0 % (ref 0.0–0.2)

## 2020-11-22 LAB — URINE CULTURE: Culture: NO GROWTH

## 2020-11-22 LAB — HIV ANTIBODY (ROUTINE TESTING W REFLEX): HIV Screen 4th Generation wRfx: NONREACTIVE

## 2020-11-22 MED ORDER — ACETAMINOPHEN 10 MG/ML IV SOLN
1000.0000 mg | Freq: Four times a day (QID) | INTRAVENOUS | Status: AC
  Administered 2020-11-22 (×4): 1000 mg via INTRAVENOUS
  Filled 2020-11-22 (×6): qty 100

## 2020-11-22 MED ORDER — KETOROLAC TROMETHAMINE 15 MG/ML IJ SOLN
15.0000 mg | Freq: Four times a day (QID) | INTRAMUSCULAR | Status: AC
  Administered 2020-11-22 – 2020-11-23 (×4): 15 mg via INTRAVENOUS
  Filled 2020-11-22 (×5): qty 1

## 2020-11-22 MED ORDER — POTASSIUM CHLORIDE 2 MEQ/ML IV SOLN: INTRAVENOUS | Status: DC

## 2020-11-22 MED ORDER — ESTRADIOL 0.1 MG/GM VA CREA
1.0000 | TOPICAL_CREAM | VAGINAL | Status: DC
  Filled 2020-11-22: qty 42.5

## 2020-11-22 MED ORDER — DOCUSATE SODIUM 100 MG PO CAPS
100.0000 mg | ORAL_CAPSULE | Freq: Two times a day (BID) | ORAL | Status: DC
  Administered 2020-11-22 – 2020-11-23 (×3): 100 mg via ORAL
  Filled 2020-11-22 (×3): qty 1

## 2020-11-22 MED ORDER — KCL IN DEXTROSE-NACL 20-5-0.45 MEQ/L-%-% IV SOLN
INTRAVENOUS | Status: DC
  Filled 2020-11-22 (×2): qty 1000

## 2020-11-22 MED ORDER — ALPRAZOLAM 1 MG PO TABS
1.0000 mg | ORAL_TABLET | Freq: Two times a day (BID) | ORAL | Status: DC | PRN
  Administered 2020-11-22 – 2020-11-23 (×2): 1 mg via ORAL
  Filled 2020-11-22 (×2): qty 1

## 2020-11-22 MED ORDER — BISACODYL 10 MG RE SUPP
10.0000 mg | Freq: Once | RECTAL | Status: DC
  Filled 2020-11-22: qty 1

## 2020-11-22 MED ORDER — SODIUM CHLORIDE 0.9 % IV SOLN
2.0000 g | Freq: Two times a day (BID) | INTRAVENOUS | Status: DC
  Administered 2020-11-22 – 2020-11-23 (×3): 2 g via INTRAVENOUS
  Filled 2020-11-22 (×3): qty 2

## 2020-11-22 MED ORDER — CHLORHEXIDINE GLUCONATE CLOTH 2 % EX PADS
6.0000 | MEDICATED_PAD | Freq: Every day | CUTANEOUS | Status: DC
  Administered 2020-11-22 – 2020-11-23 (×2): 6 via TOPICAL

## 2020-11-22 MED ORDER — BISACODYL 10 MG RE SUPP
10.0000 mg | Freq: Once | RECTAL | Status: AC
  Administered 2020-11-22: 10 mg via RECTAL
  Filled 2020-11-22: qty 1

## 2020-11-22 MED ORDER — VANCOMYCIN HCL IN DEXTROSE 1-5 GM/200ML-% IV SOLN
1000.0000 mg | INTRAVENOUS | Status: DC
  Administered 2020-11-22 – 2020-11-23 (×2): 1000 mg via INTRAVENOUS
  Filled 2020-11-22 (×2): qty 200

## 2020-11-22 NOTE — ED Notes (Signed)
Called transport to take pt to [facility]

## 2020-11-22 NOTE — Progress Notes (Signed)
Subjective: Continues to reports suprapubic/lower abdominal pain/pressure that was partially relieved with foley placement. Now reports new right side/flank pain.   She has remained afebrile and normotensive. Mild tachycardia to 100s overnight that has resolved. Elevated lactic acid to 5.4 appears to have been in error; lactic acid normal on recheck. Continued on broad spectrum antibiotics. Leukocytosis downtrending. Great yellow UOP via foley catheter with normalization of Cr.   Objective: Vital signs in last 24 hours: Temp:  [97.3 F (36.3 C)-99.6 F (37.6 C)] 99.3 F (37.4 C) (04/07 0159) Pulse Rate:  [81-109] 99 (04/07 0800) Resp:  [13-27] 13 (04/07 0800) BP: (112-152)/(54-80) 139/54 (04/07 0800) SpO2:  [91 %-100 %] 95 % (04/07 0800) Weight:  [63.5 kg] 63.5 kg (04/06 1225)  Intake/Output from previous day: 04/06 0701 - 04/07 0700 In: 2131.5 [I.V.:431.5; IV Piggyback:1700] Out: 1900 [Urine:1900] Intake/Output this shift: Total I/O In: -  Out: 900 [Urine:900]  Physical Exam:  Constitutional:  Alert and oriented, appears somewhat uncomfortable Cardiovascular: Regular rate  Respiratory: Normal respiratory effort on RA GI: Abdomen is soft, moderate distended with suprapubic/lower abdominal tenderness. Incisions intact with mild surrounding ecchymosis; no signs of infection GU: Mild right CVA tenderness. 14 Fr foley catheter in place draining clear yellow urine Neurologic: Grossly intact, no focal deficits Psychiatric: Normal mood and affect  Lab Results: Recent Labs    11/21/20 1325 11/22/20 0500  HGB 11.7* 9.6*  HCT 33.7* 27.6*   BMET Recent Labs    11/21/20 1325 11/22/20 0500  NA 126* 133*  K 4.1 3.4*  CL 92* 101  CO2 25 23  GLUCOSE 106* 104*  BUN 18 9  CREATININE 1.01* 0.73  CALCIUM 8.5* 7.9*     Studies/Results: CT Abdomen Pelvis W Contrast  Result Date: 11/21/2020 CLINICAL DATA:  Abdominal pain, acute abdominal pain in a 70 year old female. EXAM: CT  ABDOMEN AND PELVIS WITH CONTRAST TECHNIQUE: Multidetector CT imaging of the abdomen and pelvis was performed using the standard protocol following bolus administration of intravenous contrast. CONTRAST:  11mL OMNIPAQUE IOHEXOL 300 MG/ML  SOLN COMPARISON:  August 07, 2020 FINDINGS: Lower chest: Small bilateral effusions and patchy airspace disease, in the dependent chest Hepatobiliary: Stable hepatic cyst. No pericholecystic stranding. No biliary duct dilation. Pancreas: Mild pancreatic atrophy. Spleen: Spleen normal size and contour. Adrenals/Urinary Tract: Adrenal glands with stable well-circumscribed adrenal nodule that measured 146 Hounsfield units on the previous exam post-contrast, 82 Hounsfield units on today's study, 1.4 cm greatest axial dimension. Unchanged. Symmetric renal enhancement. Mild RIGHT ureteral distension and collecting system distension. Minimal LEFT distension. Marked distension of the urinary bladder. Fluid about the bladder base, along the course of the LEFT ureter in the LEFT pelvis measuring approximately 5.4 x 4.9 cm, contiguous with fluid density in the extraperitoneal spaces of the pelvis but tracking towards the ileum and RIGHT lower quadrant and along the top of the urinary bladder. Fascial enhancement in the space of Retzius where fluid extends along approximately 12 cm by 1.5 cm of the anterior lower pelvis. No signs of ascites in the upper abdomen. Enhancement of the urinary bladder wall with subtle areas of limited enhancement seen along the margin of the bladder wall in this patient with reported history of bladder injury at the time of hysterectomy. Defect in bladder wall enhancement seen on image 98 of series 5. Density of fluid is simple fluid density. Urinary bladder is markedly distended with fluid tracking mainly on the RIGHT and posterior of the urinary bladder in addition to anteriorly in  the space of Retzius. Stomach/Bowel: Stomach with small hiatal hernia. Large  duodenal diverticulum in the periampullary region of the duodenum. Secondary inflammation of the ileum with fluid adjacent to it displacing the terminal ileum in the RIGHT lower quadrant. This is the only area where the possibility of intraperitoneal extension is entertained though this may be tracking along RIGHT pelvic sidewall and into the lower margin of the posterior pararenal space based on position of adjacent ureter and ovarian vein. The distal ileum was in close proximity to the RIGHT ovary on previous imaging perhaps closely related to the broad ligament. No acute colonic process. No extravasation of ingested contrast media. Vascular/Lymphatic: Calcified atheromatous plaque in the abdominal aorta. No aneurysmal dilation. Smooth contour of the IVC. There is no gastrohepatic or hepatoduodenal ligament lymphadenopathy. No retroperitoneal or mesenteric lymphadenopathy. No pelvic sidewall lymphadenopathy. Reproductive: Post hysterectomy by report, potentially supracervical. Also with bilateral sacral colpopexy by report. Other: No ascites about the liver or the RIGHT or LEFT paracolic gutter. No free air. Musculoskeletal: No acute bone finding. No destructive bone process. Spinal degenerative changes. IMPRESSION: 1. Suspected defect in the posterior urinary bladder with intraperitoneal fluid about the urinary bladder and signs of urinary retention. Urologic consultation is suggested for decompression of the urinary bladder and further assessment a bladder leak. 2. Position of distal RIGHT ureter with respect to the suspected bladder wall defect does not allow for complete exclusion of distal ureteral injury attention on follow-up is suggested. 3. Mild RIGHT greater than LEFT hydronephrosis likely related to markedly distended urinary bladder. 4. Small bilateral effusions and patchy airspace disease, irregular appearance raises the question of infection. Correlate with any respiratory symptoms. 5. Stable 1.4 cm  well-circumscribed nodule in the right adrenal gland. This measured 146 Hounsfield units on the previous exam post-contrast, 82 Hounsfield units on today's study. Given density on the previous imaging study, follow-up adrenal protocol may be helpful as pheochromocytoma can show increased density on venous phase, greater than 130 Hounsfield units on the prior study, therefore, biochemical correlation may also be warranted. 6. Aortic atherosclerosis. These results were called by telephone at the time of interpretation on 11/21/2020 at 6:46 pm to provider Dr. Kathrynn Humble, who verbally acknowledged these results. Aortic Atherosclerosis (ICD10-I70.0). Electronically Signed   By: Zetta Bills M.D.   On: 11/21/2020 18:40   CT CYSTOGRAM PELVIS  Result Date: 11/21/2020 CLINICAL DATA:  70 year old female with bladder rupture. EXAM: CT CYSTOGRAM (CT PELVIS WITH CONTRAST) TECHNIQUE: Multidetector CT imaging through the pelvis was performed after dilute contrast had been introduced into the bladder for the purposes of performing CT cystography. CONTRAST:  31mL OMNIPAQUE IOHEXOL 300 MG/ML  SOLN COMPARISON:  CT abdomen pelvis dated 11/21/2020. FINDINGS: Urinary Tract: There is a 16 mm defect in the posterior bladder wall to the right of the midline with extravesical extension of contrast consistent with bladder rupture. The extraluminal contrast appears primarily extraperitoneal and within the pelvis. A Foley catheter is noted with balloon within the bladder. Air within the bladder likely introduced via the catheter. Bowel:  Sigmoid diverticulosis.  No bowel dilatation. Vascular/Lymphatic: Mild atherosclerotic calcification of the visualized aorta. Reproductive:  The uterus is retroverted and grossly unremarkable. Other:  Mild subcutaneous edema. Musculoskeletal: No acute findings. IMPRESSION: 1. Posterior bladder rupture, appears extraperitoneal. 2. Aortic Atherosclerosis (ICD10-I70.0). Electronically Signed   By: Anner Crete M.D.   On: 11/21/2020 22:23    Assessment/Plan: Megan [name] is a 70 y.o. female with history of pelvic organ prolapse s/p  uncomplicated robotic supracervical hysterectomy, bilateral salpingo-oophorectomy, and sacrocolpopexy with mesh with Dr. Louis Meckel on3/31/22 who presented to the ED on 4/6 with worsening abdominal bloating/pressure and weakness. She was found to have acute urinary retention and extraperitoneal bladder injury on imaging, which is currently being managed conservatively with bladder drainage by foley catheter and broad spectrum antibiotics. Overall, she is clinically improving.   Plan:  - Continue bladder decompression with foley catheter. Will likely need a foley catheter for 10-14 days with cystogram prior to removal to ensure bladder injury has healed  - Will obtain renal ultrasound tomorrow to evaluate for right hydronephrosis after bladder decompression. If hydronephrosis still present, then we will tentatively obtain a CT Urogram - Continue broad spectrum antibiotics of vanc/cefepime.  Follow up urine and blood cultures. Narrow antibiotics accordingly - Regular diet, 100cc/hr mIVF, bowel regimen  - PRN analgesics and antiemetics - Continue home meds - Daily labs - SCDs, IS, encourage ambulation - PT consult given minimal ambulation at home    LOS: 0 days   Celene Squibb 11/22/2020, 11:24 AM

## 2020-11-22 NOTE — ED Notes (Signed)
NADN, VSS, side rails up x2, call light in reach, RR e/u.

## 2020-11-22 NOTE — ED Notes (Signed)
Brought pt meal trey

## 2020-11-22 NOTE — ED Notes (Signed)
Pt had small bm, cleaned pt, changed sheets, put pad down.

## 2020-11-22 NOTE — Evaluation (Signed)
Physical Therapy Evaluation Patient Details Name: Megan [name] MRN: [id number] DOB: [birth year] Today's Date: 11/22/2020   History of Present Illness  Megan [name] is a 70 y.o. female presents with worsening abdominal bloating/pressure and weakness with acute urinary retention. CT cystogram: extraperitoneal bladder injury with mid posterior wall defect; foley catheter placed. 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 Pt independent at baseline, but has been slow to ambulate, difficulty standing and getting out of bed since surgery 11/15/20. Pt currently requiring min A with cues for log rolling to protect abdomen/minimize pain with supine to sit. Pt able to stand and ambulate 30 ft in room with IV pole, slow steady steps with trunk slightly flexed due to discomfort and improving with distance. Pt plans to d/c home to daughter's house and has BSC and RW present, daughter will be present 24/7 to assist with recovery. Pt currently with functional limitations due to the deficits listed below (see PT Problem List). Pt will benefit from skilled PT to increase their independence and safety with mobility to allow discharge to the venue listed below.       Follow Up Recommendations No PT follow up    Equipment Recommendations  None recommended by PT    Recommendations for Other Services       Precautions / Restrictions Precautions Precautions: None Restrictions Weight Bearing Restrictions: No      Mobility  Bed Mobility Overal bed mobility: Needs Assistance Bed Mobility: Supine to Sit;Sit to Supine  Supine to sit: Min assist Sit to supine: Min guard   General bed mobility comments: min A and multimodal cues for supine to sit through log rolling technique to protect abdomen and minimize pain, min G for return to  supine with cues for form    Transfers Overall transfer level: Needs assistance Equipment used: None Transfers: Sit to/from [facility] Sit to Stand: Min guard Stand pivot transfers: Min guard  General transfer comment: min G to steady with rising to stand, slow to stand due to initial discomfort, able to pivot to chair at bedside with cues for hand placement  Ambulation/Gait Ambulation/Gait assistance: Supervision Gait Distance (Feet): 30 Feet Assistive device: IV Pole Gait Pattern/deviations: Step-through pattern;Decreased stride length Gait velocity: decreased   General Gait Details: pt takes slow, short steps with hand on IV pole, initially trunk flexed forward due to discomfort in upright position, improves with time, good steadiness with ambulation around the room  Stairs            Wheelchair Mobility    Modified Rankin (Stroke Patients Only)       Balance Overall balance assessment: Mild deficits observed, not formally tested            Pertinent Vitals/Pain Pain Assessment: 0-10 Pain Score: 7  Pain Location: back in supine (improved to 1/10 with standing and ambulation) Pain Descriptors / Indicators: Sore Pain Intervention(s): Limited activity within patient's tolerance;Monitored during session    Home Living Family/patient expects to be discharged to:: Private residence Living Arrangements: Spouse/significant other;Children (Pt reports going to stay with daughter temporarily) Available Help at Discharge: Family;Available 24 hours/day Type of Home: House Home Access: Other (comment) (1 threshold)     Home Layout: One level Home Equipment: Walker - 2 wheels;Bedside commode Additional Comments: Pt reports going to stay with daughter who will be present 24/7 until she recovers.  Prior Function Level of Independence: Independent  Comments: Pt reports independent with ADLs, community ambulation, drives. Last week, pt very weak,  limited to ~50 ft distances.     Hand Dominance   Dominant Hand: Right    Extremity/Trunk Assessment   Upper Extremity Assessment Upper Extremity Assessment: Overall WFL for tasks assessed    Lower Extremity Assessment Lower Extremity Assessment: Overall WFL for tasks assessed (AROM WNL, strength grossly 3+/5, denies numbness/tingling throughout)    Cervical / Trunk Assessment Cervical / Trunk Assessment: Normal  Communication   Communication: No difficulties  Cognition Arousal/Alertness: Awake/alert Behavior During Therapy: WFL for tasks assessed/performed Overall Cognitive Status: Within Functional Limits for tasks assessed        General Comments      Exercises     Assessment/Plan    PT Assessment Patient needs continued PT services  PT Problem List Decreased activity tolerance;Decreased balance;Decreased mobility;Pain       PT Treatment Interventions DME instruction;Gait training;Functional mobility training;Therapeutic activities;Therapeutic exercise;Balance training;Patient/family education    PT Goals (Current goals can be found in the Care Plan section)  Acute Rehab PT Goals Patient Stated Goal: "go home with my daughter" PT Goal Formulation: With patient Time For Goal Achievement: 12/06/20 Potential to Achieve Goals: Good    Frequency Min 3X/week   Barriers to discharge        Co-evaluation               AM-PAC PT "6 Clicks" Mobility  Outcome Measure Help needed turning from your back to your side while in a flat bed without using bedrails?: A Little Help needed moving from lying on your back to sitting on the side of a flat bed without using bedrails?: A Little Help needed moving to and from a bed to a chair (including a wheelchair)?: A Little Help needed standing up from a chair using your arms (e.g., wheelchair or bedside chair)?: A Little Help needed to walk in hospital room?: A Little Help needed climbing 3-5 steps with a railing? : A  Little 6 Click Score: 18    End of Session   Activity Tolerance: Patient tolerated treatment well Patient left: in bed;with call bell/phone within reach Nurse Communication: Mobility status PT Visit Diagnosis: Other abnormalities of gait and mobility (R26.89)    Time: 1694-5038 PT Time Calculation (min) (ACUTE ONLY): 17 min   Charges:   PT Evaluation $PT Eval Low Complexity: 1 Low          Tori Danelly Hassinger PT, DPT 11/22/20, 9:34 AM

## 2020-11-22 NOTE — ED Notes (Signed)
Per Dr. Louis Meckel, pt will be admitted and staying in hospital tonight, possibly tomorrow night as well. Will notify family

## 2020-11-22 NOTE — ED Notes (Signed)
Emptied 900cc from foley bag

## 2020-11-22 NOTE — Sepsis Progress Note (Signed)
Erroneous lactic acid result led to duplicate sepsis call. All sepsis criteria met. Repeat lactic acid in the expected range. Will close sepsis monitoring. 

## 2020-11-22 NOTE — ED Notes (Signed)
Pt asking for xanex, provider made aware

## 2020-11-23 ENCOUNTER — Observation Stay (HOSPITAL_COMMUNITY): Payer: Medicare HMO

## 2020-11-23 DIAGNOSIS — N281 Cyst of kidney, acquired: Secondary | ICD-10-CM | POA: Diagnosis not present

## 2020-11-23 LAB — BLOOD CULTURE ID PANEL (REFLEXED) - BCID2

## 2020-11-23 LAB — BASIC METABOLIC PANEL
Anion gap: 7 (ref 5–15)
BUN: 8 mg/dL (ref 8–23)
CO2: 23 mmol/L (ref 22–32)
Calcium: 8.1 mg/dL — ABNORMAL LOW (ref 8.9–10.3)
Chloride: 105 mmol/L (ref 98–111)
Creatinine, Ser: 0.59 mg/dL (ref 0.44–1.00)
GFR, Estimated: >60 mL/min (ref >60)
Glucose, Bld: 110 mg/dL — ABNORMAL HIGH (ref 70–99)
Potassium: 3.4 mmol/L — ABNORMAL LOW (ref 3.5–5.1)
Sodium: 135 mmol/L (ref 135–145)

## 2020-11-23 LAB — CBC
HCT: 29.2 % — ABNORMAL LOW (ref 36.0–46.0)
Hemoglobin: 9.7 g/dL — ABNORMAL LOW (ref 12.0–15.0)
MCH: 31.7 pg (ref 26.0–34.0)
MCHC: 33.2 g/dL (ref 30.0–36.0)
MCV: 95.4 fL (ref 80.0–100.0)
Platelets: 255 10*3/uL (ref 150–400)
RBC: 3.06 MIL/uL — ABNORMAL LOW (ref 3.87–5.11)
RDW: 12.6 % (ref 11.5–15.5)
WBC: 11.9 10*3/uL — ABNORMAL HIGH (ref 4.0–10.5)
nRBC: 0 % (ref 0.0–0.2)

## 2020-11-23 MED ORDER — AMOXICILLIN-POT CLAVULANATE 875-125 MG PO TABS
1.0000 | ORAL_TABLET | Freq: Two times a day (BID) | ORAL | Status: DC
  Administered 2020-11-23: 1 via ORAL
  Filled 2020-11-23: qty 1

## 2020-11-23 MED ORDER — AMOXICILLIN-POT CLAVULANATE 875-125 MG PO TABS: 1.0000 | ORAL_TABLET | Freq: Two times a day (BID) | ORAL | 0 refills | Status: AC

## 2020-11-23 MED ORDER — POLYETHYLENE GLYCOL 3350 17 G PO PACK: 17.0000 g | PACK | Freq: Every day | ORAL | 0 refills | Status: DC

## 2020-11-23 NOTE — Discharge Instructions (Signed)
Foley Catheter Care A soft, flexible tube (Foley catheter) may have been placed in your bladder to drain urine and fluid. Follow these instructions: Taking Care of the Catheter  Keep the area where the catheter leaves your body clean.   Attach the catheter to the leg so there is no tension on the catheter.   Keep the drainage bag below the level of the bladder, but keep it OFF the floor.   Do not take long soaking baths. Your caregiver will give instructions about showering.   Wash your hands before touching ANYTHING related to the catheter or bag.   Using mild soap and warm water on a washcloth:   Clean the area closest to the catheter insertion site using a circular motion around the catheter.   Clean the catheter itself by wiping AWAY from the insertion site for several inches down the tube.   NEVER wipe upward as this could sweep bacteria up into the urethra (tube in your body that normally drains the bladder) and cause infection.   Place a small amount of sterile lubricant at the tip of the penis where the catheter is entering.  Taking Care of the Drainage Bags  Two drainage bags may be taken home: a large overnight drainage bag, and a smaller leg bag which fits underneath clothing.   It is okay to wear the overnight bag at any time, but NEVER wear the smaller leg bag at night.   Keep the drainage bag well below the level of your bladder. This prevents backflow of urine into the bladder and allows the urine to drain freely.   Anchor the tubing to your leg to prevent pulling or tension on the catheter. Use tape or a leg strap provided by the hospital.   Empty the drainage bag when it is 1/2 to 3/4 full. Wash your hands before and after touching the bag.   Periodically check the tubing for kinks to make sure there is no pressure on the tubing which could restrict the flow of urine.  Changing the Drainage Bags  Cleanse both ends of the clean bag with alcohol before changing.    Pinch off the rubber catheter to avoid urine spillage during the disconnection.   Disconnect the dirty bag and connect the clean one.   Empty the dirty bag carefully to avoid a urine spill.   Attach the new bag to the leg with tape or a leg strap.  Cleaning the Drainage Bags  Whenever a drainage bag is disconnected, it must be cleaned quickly so it is ready for the next use.   Wash the bag in warm, soapy water.   Rinse the bag thoroughly with warm water.   Soak the bag for 30 minutes in a solution of white vinegar and water (1 cup vinegar to 1 quart warm water).   Rinse with warm water.  SEEK MEDICAL CARE IF:   You have chills or night sweats.   You are leaking around your catheter or have problems with your catheter. It is not uncommon to have sporadic leakage around your catheter as a result of bladder spasms. If the leakage stops, there is not much need for concern. If you are uncertain, call your caregiver.   You develop side effects that you think are coming from your medicines.  SEEK IMMEDIATE MEDICAL CARE IF:   You are suddenly unable to urinate. Check to see if there are any kinks in the drainage tubing that may cause this. If you  cannot find any kinks, call your caregiver immediately. This is an emergency.   You develop shortness of breath or chest pains.   Bleeding persists or clots develop in your urine.   You have a fever.   You develop pain in your back or over your lower belly (abdomen).   You develop pain or swelling in your legs.   Any problems you are having get worse rather than better.  MAKE SURE YOU:   Understand these instructions.   Will watch your condition.   Will get help right away if you are not doing well or get worse.     Activity:  You are encouraged to ambulate frequently (about every hour during waking hours) to help prevent blood clots from forming in your legs or lungs.  However, you should not engage in any heavy lifting (>  10-15 lbs), strenuous activity, or straining.   Diet: You should advance your diet as instructed by your physician.  It will be normal to have some bloating, nausea, and abdominal discomfort intermittently.   Prescriptions:  You will be provided a prescription for pain medication to take as needed.  If your pain is not severe enough to require the prescription pain medication, you may take extra strength Tylenol instead which will have less side effects.  You should also take a prescribed stool softener to avoid straining with bowel movements as the prescription pain medication may constipate you.   Incisions: You may remove your dressing bandages 48 hours after surgery if not removed in the hospital.  You will either have some small staples or special tissue glue at each of the incision sites. Once the bandages are removed (if present), the incisions may stay open to air.  You may start showering (but not soaking or bathing in water) the 2nd day after surgery and the incisions simply need to be patted dry after the shower.  No additional care is needed.   What to call us about: You should call the office [phone]) if you develop fever > 101 or develop persistent vomiting. Activity:  You are encouraged to ambulate frequently (about every hour during waking hours) to help prevent blood clots from forming in your legs or lungs.  However, you should not engage in any heavy lifting (> 10-15 lbs), strenuous activity, or straining.

## 2020-11-23 NOTE — Discharge Summary (Signed)
Date of admission: 11/21/2020  Date of discharge: 11/23/2020  Admission diagnosis: urinary retention, bilateral hydronephrosis, extraperitoneal bladder rupture  Discharge diagnosis: same  Secondary diagnoses:  Patient Active Problem List   Diagnosis Date Noted  . Bladder injury 11/22/2020  . Urinary retention 11/21/2020  . Cystocele with prolapse 11/15/2020  . Asthma 11/05/2020  . Uterine prolapse 11/05/2020  . Episodic cluster headache, not intractable 09/14/2020  . Chronic left-sided headache 08/09/2020  . Cerebral vascular disease 08/09/2020  . Depression with anxiety 08/09/2020  . 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 Closed fracture of right distal radius 01/12/2019  . Moderate obstructive sleep apnea 05/21/2017  . Vitamin D deficiency 07/17/2016  . Abdominal aortic atherosclerosis ([place]) 07/16/2016  . Renal artery stenosis ([place]) 06/12/2016  . Chronic low back pain 08/24/2015  . Hyperlipidemia, mixed 12/17/2014  . Hepatic cyst 05/22/2014  . Insomnia 03/10/2014  . 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 Osteoporosis 09/07/2012  . 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 Tricuspid regurgitation, Mild-Moderate 07/21/2011  . Mitral regurgitation, Trivial 07/21/2011  . Gastroesophageal reflux disease with hiatal hernia 06/17/2011  . Endometrial hyperplasia   . Diverticulosis   . IBS (irritable bowel syndrome) with constipation 04/24/2011  . Barrett's esophagus 03/04/2011  . Hiatal hernia 03/04/2011  . Basal cell carcinoma (BCC) of face 11/15/2009  . Vitamin B12 deficiency 02/15/2009  . 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 Essential hypertension 01/04/2009    Procedures performed:   History and Physical: For full details, please see admission history and physical. Briefly, Megan [name] is a 70 y.o. year old patient with history of recent robotic hysterectomy and sacrocolpopexy last week and was readmitted with urinary retention leading hydronephrosis and an  extraperitoneal bladder rupture.   Hospital Course:   In the ED the patient had a foley catheter placed.  She instantly felt better.  Her urine culture was negative.  Her blood culture was also negative.  She was on broad spectrum antibiotics for 48 hours and then transitioned to Augmentin.  She was afebrile throughout her hospitalization.  Her renal ultrasound at discharge was unremarkable. She had several large bowel movements.  She was eating well and was ready for discharge on HD#3.   Laboratory values:  Recent Labs    11/21/20 1325 11/22/20 0500 11/23/20 0434  WBC 26.0* 16.5* 11.9*  HGB 11.7* 9.6* 9.7*  HCT 33.7* 27.6* 29.2*   Recent Labs    11/21/20 1325 11/22/20 0500 11/23/20 0434  NA 126* 133* 135  K 4.1 3.4* 3.4*  CL 92* 101 105  CO2 25 23 23   GLUCOSE 106* 104* 110*  BUN 18 9 8   CREATININE 1.01* 0.73 0.59  CALCIUM 8.5* 7.9* 8.1*   No results for input(s): LABPT, INR in the last 72 hours. No results for input(s): LABURIN in the last 72 hours. Results for orders placed or performed during the hospital encounter of 11/21/20  Urine culture     Status: None   Collection Time: 11/21/20  1:17 PM   Specimen: Urine, Random  Result Value Ref Range Status   Specimen Description   Final    URINE, RANDOM Performed at [facility] [address], [place], [place] [address]    Special Requests   Final    NONE Performed at [facility], [place] [address], [place], [place] [address]    Culture   Final    NO GROWTH Performed at [facility], [place] [address], [place], [place] [address]  Report Status 11/22/2020 FINAL  Final  Blood Culture (routine x 2)     Status: None (Preliminary result)   Collection Time: 11/21/20  4:40 PM   Specimen: BLOOD  Result Value Ref Range Status   Specimen Description   Final    BLOOD LEFT ANTECUBITAL Performed at [facility] [address] [place], [place] [address]    Special  Requests   Final    BOTTLES DRAWN AEROBIC AND ANAEROBIC Blood Culture adequate volume Performed at [facility] [address] [place], [place] [address]    Culture   Final    NO GROWTH 2 DAYS Performed at [facility] [address], [place], [place] [address]    Report Status PENDING  Incomplete  Blood Culture (routine x 2)     Status: None (Preliminary result)   Collection Time: 11/21/20  4:45 PM   Specimen: BLOOD  Result Value Ref Range Status   Specimen Description   Final    BLOOD LEFT ANTECUBITAL Performed at [facility] [address] [place], [place] [address]    Special Requests   Final    BOTTLES DRAWN AEROBIC AND ANAEROBIC Blood Culture adequate volume Performed at [facility] [address], [place], [place] [address]    Culture  Setup Time   Final    GRAM POSITIVE COCCI IN CLUSTERS AEROBIC BOTTLE ONLY CRITICAL RESULT CALLED TO, READ BACK BY AND VERIFIED WITH: Shanon Rosser [id number] FCP Performed at [facility], [place] [address], [place], [place] [address]    Culture GRAM POSITIVE COCCI  Final   Report Status PENDING  Incomplete  Blood Culture ID Panel (Reflexed)     Status: None   Collection Time: 11/21/20  4:45 PM  Result Value Ref Range Status   Enterococcus faecalis NOT DETECTED NOT DETECTED Final   Enterococcus Faecium NOT DETECTED NOT DETECTED Final   Listeria monocytogenes NOT DETECTED NOT DETECTED Final   Staphylococcus species NOT DETECTED NOT DETECTED Final   Staphylococcus aureus (BCID) NOT DETECTED NOT DETECTED Final   Staphylococcus epidermidis NOT DETECTED NOT DETECTED Final   Staphylococcus lugdunensis NOT DETECTED NOT DETECTED Final   Streptococcus species NOT DETECTED NOT DETECTED Final   Streptococcus agalactiae NOT DETECTED NOT DETECTED Final   Streptococcus pneumoniae NOT DETECTED NOT DETECTED Final   Streptococcus pyogenes NOT DETECTED NOT DETECTED Final    A.calcoaceticus-baumannii NOT DETECTED NOT DETECTED Final   Bacteroides fragilis NOT DETECTED NOT DETECTED Final   Enterobacterales NOT DETECTED NOT DETECTED Final   Enterobacter cloacae complex NOT DETECTED NOT DETECTED Final   Escherichia coli NOT DETECTED NOT DETECTED Final   Klebsiella aerogenes NOT DETECTED NOT DETECTED Final   Klebsiella oxytoca NOT DETECTED NOT DETECTED Final   Klebsiella pneumoniae NOT DETECTED NOT DETECTED Final   Proteus species NOT DETECTED NOT DETECTED Final   Salmonella species NOT DETECTED NOT DETECTED Final   Serratia marcescens NOT DETECTED NOT DETECTED Final   Haemophilus influenzae NOT DETECTED NOT DETECTED Final   Neisseria meningitidis NOT DETECTED NOT DETECTED Final   Pseudomonas aeruginosa NOT DETECTED NOT DETECTED Final   Stenotrophomonas maltophilia NOT DETECTED NOT DETECTED Final   Candida albicans NOT DETECTED NOT DETECTED Final   Candida auris NOT DETECTED NOT DETECTED Final   Candida glabrata NOT DETECTED NOT DETECTED Final   Candida krusei NOT DETECTED NOT DETECTED Final   Candida parapsilosis NOT DETECTED NOT DETECTED Final   Candida tropicalis  NOT DETECTED NOT DETECTED Final   Cryptococcus neoformans/gattii NOT DETECTED NOT DETECTED Final    Comment: Performed at [facility], [place] [address], [place], [place] [address]  Resp Panel by RT-PCR (Flu A&B, Covid) Nasopharyngeal Swab     Status: None   Collection Time: 11/21/20  8:10 PM   Specimen: Nasopharyngeal Swab; Nasopharyngeal(NP) swabs in vial transport medium  Result Value Ref Range Status   SARS Coronavirus 2 by RT PCR NEGATIVE NEGATIVE Final    Comment: (NOTE) SARS-CoV-2 target nucleic acids are NOT DETECTED.  The SARS-CoV-2 RNA is generally detectable in upper respiratory specimens during the acute phase of infection. The lowest concentration of SARS-CoV-2 viral copies this assay can detect is 138 copies/mL. A negative result does not preclude SARS-Cov-2 infection and  should not be used as the sole basis for treatment or other patient management decisions. A negative result may occur with  improper specimen collection/handling, submission of specimen other than nasopharyngeal swab, presence of viral mutation(s) within the areas targeted by this assay, and inadequate number of viral copies(<138 copies/mL). A negative result must be combined with clinical observations, patient history, and epidemiological information. The expected result is Negative.  Fact Sheet for Patients:  EntrepreneurPulse.com.au  Fact Sheet for Healthcare Providers:  IncredibleEmployment.be  This test is no t yet approved or cleared by the [place] FDA and  has been authorized for detection and/or diagnosis of SARS-CoV-2 by FDA under an Emergency Use Authorization (EUA). This EUA will remain  in effect (meaning this test can be used) for the duration of the COVID-19 declaration under Section 564(b)(1) of the Act, 21 U.S.C.section 360bbb-3(b)(1), unless the authorization is terminated  or revoked sooner.       Influenza A by PCR NEGATIVE NEGATIVE Final   Influenza B by PCR NEGATIVE NEGATIVE Final    Comment: (NOTE) The Xpert Xpress SARS-CoV-2/FLU/RSV plus assay is intended as an aid in the diagnosis of influenza from Nasopharyngeal swab specimens and should not be used as a sole basis for treatment. Nasal washings and aspirates are unacceptable for Xpert Xpress SARS-CoV-2/FLU/RSV testing.  Fact Sheet for Patients: EntrepreneurPulse.com.au  Fact Sheet for Healthcare Providers: IncredibleEmployment.be  This test is not yet approved or cleared by the [place] FDA and has been authorized for detection and/or diagnosis of SARS-CoV-2 by FDA under an Emergency Use Authorization (EUA). This EUA will remain in effect (meaning this test can be used) for the duration of the COVID-19 declaration  under Section 564(b)(1) of the Act, 21 U.S.C. section 360bbb-3(b)(1), unless the authorization is terminated or revoked.  Performed at [facility], [place] [address] [place], [place] [address]    *Note: Due to a large number of results and/or encounters for the requested time period, some results have not been displayed. A complete set of results can be found in Results Review.    Disposition: Home  Discharge instruction: The patient was instructed to be ambulatory but told to refrain from heavy lifting, strenuous activity, or driving.   Discharge medications:  Allergies as of 11/23/2020      Reactions   Sulfamethoxazole Shortness Of Breath   chest tightness   Amitriptyline Anxiety, Other (See Comments)   Depression, insomnia   Effexor [venlafaxine]    Palpitations, hypertension   Lactose Intolerance (gi) Diarrhea, Nausea And Vomiting   Lexapro [escitalopram Oxalate]    nausea   Prednisone Palpitations      Medication List    STOP taking these medications  cephALEXin 500 MG capsule Commonly known as: KEFLEX     TAKE these medications   albuterol 108 (90 Base) MCG/ACT inhaler Commonly known as: VENTOLIN HFA Inhale 2 puffs into the lungs every 6 (six) hours as needed for wheezing or shortness of breath.   ALPRAZolam 1 MG tablet Commonly known as: XANAX TAKE 1 TABLET BY MOUTH TWICE A DAY AS NEEDED What changed:   how much to take  how to take this  when to take this  reasons to take this  additional instructions   amoxicillin-clavulanate 875-125 MG tablet Commonly known as: AUGMENTIN Take 1 tablet by mouth every 12 (twelve) hours for 10 days.   atorvastatin 20 MG tablet Commonly known as: LIPITOR Take 1 tablet (20 mg total) by mouth daily.   azelastine 0.1 % nasal spray Commonly known as: ASTELIN Place 2 sprays into both nostrils 2 (two) times daily. Use in each nostril as directed   diltiazem 180 MG 24 hr capsule Commonly known as:  Cartia XT Take 1 capsule (180 mg total) by mouth daily.   docusate sodium 100 MG capsule Commonly known as: COLACE Take 1 capsule (100 mg total) by mouth 2 (two) times daily. What changed: Another medication with the same name was removed. Continue taking this medication, and follow the directions you see here.   estradiol 0.1 MG/GM vaginal cream Commonly known as: ESTRACE VAGINAL Place 1 Applicatorful vaginally 3 (three) times a week. Use 1 small dolyp of cream on tip of index finger and swap the inside of the vagina   gabapentin 100 MG capsule Commonly known as: NEURONTIN Take 1 capsule (100 mg total) by mouth 3 (three) times daily.   metoprolol succinate 25 MG 24 hr tablet Commonly known as: TOPROL-XL Take 1 tablet (25 mg total) by mouth daily. Please make yearly appt with Dr. Burt Knack for April 2022 for future refills. Thank you 1st attempt   omeprazole 40 MG capsule Commonly known as: PRILOSEC Take 40 mg by mouth in the morning and at bedtime.   polyethylene glycol 17 g packet Commonly known as: MIRALAX / GLYCOLAX Take 17 g by mouth daily. Start taking on: November 24, 2020   tiZANidine 4 MG tablet Commonly known as: Zanaflex Take 1 tablet (4 mg total) by mouth at bedtime.   traMADol 50 MG tablet Commonly known as: Ultram Take 1-2 tablets (50-100 mg total) by mouth every 6 (six) hours as needed for moderate pain or severe pain.       Followup:   Follow-up Information    Rudd, Lillette Boxer, MD.   Specialty: Family Medicine Contact information: [place] [address] [phone]        Ardis Hughs, MD On 12/03/2020.   Specialty: Urology Why: Catheter removal, For wound re-check Contact information: [place] [address] [phone]

## 2020-11-23 NOTE — Progress Notes (Signed)
AVS given to patient and explained at the bedside. Medications and follow up appointments have been explained with pt verbalizing understanding.  

## 2020-11-23 NOTE — Progress Notes (Signed)
PHARMACY - PHYSICIAN COMMUNICATION CRITICAL VALUE ALERT - BLOOD CULTURE IDENTIFICATION (BCID)  Megan [name] is an 70 y.o. female who presented to [facility] on [facility]/21/2020 with a chief complaint of abdominal pain  Assessment:1/4 aerobic, PTC clusters, no BCID Name of physician (or Provider) Contacted: [facility]  Current antibiotics: vanc and cefepime  Changes to prescribed antibiotics recommended:  none  Results for orders placed or performed during the hospital encounter of 11/21/20  Blood Culture ID Panel (Reflexed) (Collected: 11/21/2020  4:45 PM)  Result Value Ref Range   Enterococcus faecalis NOT DETECTED NOT DETECTED   Enterococcus Faecium NOT DETECTED NOT DETECTED   Listeria monocytogenes NOT DETECTED NOT DETECTED   Staphylococcus species NOT DETECTED NOT DETECTED   Staphylococcus aureus (BCID) NOT DETECTED NOT DETECTED   Staphylococcus epidermidis NOT DETECTED NOT DETECTED   Staphylococcus lugdunensis NOT DETECTED NOT DETECTED   Streptococcus species NOT DETECTED NOT DETECTED   Streptococcus agalactiae NOT DETECTED NOT DETECTED   Streptococcus pneumoniae NOT DETECTED NOT DETECTED   Streptococcus pyogenes NOT DETECTED NOT DETECTED   A.calcoaceticus-baumannii NOT DETECTED NOT DETECTED   Bacteroides fragilis NOT DETECTED NOT DETECTED   Enterobacterales NOT DETECTED NOT DETECTED   Enterobacter cloacae complex NOT DETECTED NOT DETECTED   Escherichia coli NOT DETECTED NOT DETECTED   Klebsiella aerogenes NOT DETECTED NOT DETECTED   Klebsiella oxytoca NOT DETECTED NOT DETECTED   Klebsiella pneumoniae NOT DETECTED NOT DETECTED   Proteus species NOT DETECTED NOT DETECTED   Salmonella species NOT DETECTED NOT DETECTED   Serratia marcescens NOT DETECTED NOT DETECTED   Haemophilus influenzae NOT DETECTED NOT DETECTED   Neisseria meningitidis NOT DETECTED NOT DETECTED   Pseudomonas aeruginosa NOT DETECTED NOT DETECTED   Stenotrophomonas maltophilia NOT DETECTED NOT DETECTED    Candida albicans NOT DETECTED NOT DETECTED   Candida auris NOT DETECTED NOT DETECTED   Candida glabrata NOT DETECTED NOT DETECTED   Candida krusei NOT DETECTED NOT DETECTED   Candida parapsilosis NOT DETECTED NOT DETECTED   Candida tropicalis NOT DETECTED NOT DETECTED   Cryptococcus neoformans/gattii NOT DETECTED NOT DETECTED    Dolly Rias RPh 11/23/2020, 5:10 AM

## 2020-11-23 NOTE — Progress Notes (Signed)
Urology Inpatient Progress Report  Hydronephrosis [N13.30] Urinary retention [R33.9] Bladder injury [S37.20XA] Urinary tract infection without hematuria, site unspecified [N39.0]   Intv/Subj: No acute events overnight. Patient is without complaint. Pain much better Had BM this AM No nausea, eating well.  Active Problems:   Urinary retention   Bladder injury  Current Facility-Administered Medications  Medication Dose Route Frequency Provider Last Rate Last Admin  . albuterol (VENTOLIN HFA) 108 (90 Base) MCG/ACT inhaler 2 puff  2 puff Inhalation Q6H PRN Celene Squibb, MD      . ALPRAZolam Duanne Moron) tablet 1 mg  1 mg Oral BID PRN Celene Squibb, MD   1 mg at 11/22/20 1633  . amoxicillin-clavulanate (AUGMENTIN) 875-125 MG per tablet 1 tablet  1 tablet Oral Q12H Ardis Hughs, MD   1 tablet at 11/23/20 1030  . atorvastatin (LIPITOR) tablet 20 mg  20 mg Oral Daily Celene Squibb, MD      . bisacodyl (DULCOLAX) suppository 10 mg  10 mg Rectal Once Ardis Hughs, MD      . Chlorhexidine Gluconate Cloth 2 % PADS 6 each  6 each Topical Daily Ardis Hughs, MD   6 each at 11/23/20 1032  . diltiazem (CARDIZEM CD) 24 hr capsule 180 mg  180 mg Oral Daily Celene Squibb, MD   180 mg at 11/23/20 1031  . docusate sodium (COLACE) capsule 100 mg  100 mg Oral BID Ardis Hughs, MD   100 mg at 11/23/20 1030  . estradiol (ESTRACE) vaginal cream 1 Applicatorful  1 Applicatorful Vaginal Q M,W,F-2000 Ardis Hughs, MD      . gabapentin (NEURONTIN) capsule 100 mg  100 mg Oral TID Celene Squibb, MD   100 mg at 11/23/20 1030  . HYDROmorphone (DILAUDID) injection 0.5-1 mg  0.5-1 mg Intravenous Q2H PRN Celene Squibb, MD      . ketorolac (TORADOL) 15 MG/ML injection 15 mg  15 mg Intravenous Q6H Ardis Hughs, MD   15 mg at 11/23/20 0543  . metoprolol succinate (TOPROL-XL) 24 hr tablet 25 mg  25 mg Oral Daily Celene Squibb, MD   25 mg at 11/23/20 1030  .  ondansetron (ZOFRAN) injection 4 mg  4 mg Intravenous Q4H PRN Celene Squibb, MD      . pantoprazole (PROTONIX) EC tablet 80 mg  80 mg Oral Daily Celene Squibb, MD   80 mg at 11/23/20 1029  . polyethylene glycol (MIRALAX / GLYCOLAX) packet 17 g  17 g Oral Daily Celene Squibb, MD   17 g at 11/23/20 1031  . tiZANidine (ZANAFLEX) tablet 4 mg  4 mg Oral QHS Celene Squibb, MD   4 mg at 11/22/20 2132  . traMADol (ULTRAM) tablet 50-100 mg  50-100 mg Oral Q6H PRN Celene Squibb, MD         Objective: Vital: Vitals:   11/22/20 1613 11/22/20 2001 11/22/20 2337 11/23/20 0531  BP: (!) 152/68 (!) 119/52 108/82 129/63  Pulse: 91 81 86 78  Resp: 18 20 20 16   Temp: 98.6 F (37 C) 98.2 F (36.8 C) 98 F (36.7 C) 98.3 F (36.8 C)  TempSrc: Oral Oral Oral Oral  SpO2: 96% 98% 97% 95%  Weight:      Height:       I/Os: I/O last 3 completed shifts: In: 3225.7 [P.O.:200; I.V.:1425.7; IV Piggyback:1600] Out: 4750 [Urine:4750]  Physical Exam:  General: Patient is in no apparent distress Lungs: Normal respiratory effort, chest expands symmetrically. GI: Incisions are c/d/i.  The abdomen is soft and nontender without mass.  No CVA tenderness Foley: clear yellow urine  Ext: lower extremities symmetric  Lab Results: Recent Labs    11/21/20 1325 11/22/20 0500 11/23/20 0434  WBC 26.0* 16.5* 11.9*  HGB 11.7* 9.6* 9.7*  HCT 33.7* 27.6* 29.2*   Recent Labs    11/21/20 1325 11/22/20 0500 11/23/20 0434  NA 126* 133* 135  K 4.1 3.4* 3.4*  CL 92* 101 105  CO2 25 23 23   GLUCOSE 106* 104* 110*  BUN 18 9 8   CREATININE 1.01* 0.73 0.59  CALCIUM 8.5* 7.9* 8.1*   No results for input(s): LABPT, INR in the last 72 hours. No results for input(s): LABURIN in the last 72 hours. Results for orders placed or performed during the hospital encounter of 11/21/20  Urine culture     Status: None   Collection Time: 11/21/20  1:17 PM   Specimen: Urine, Random  Result Value Ref Range Status    Specimen Description   Final    URINE, RANDOM Performed at [facility] [address], [place], [place] [address]    Special Requests   Final    NONE Performed at [facility], [place] [address] [place], [place] [address]    Culture   Final    NO GROWTH Performed at [facility], [place] [address], [place], [place] [address]    Report Status 11/22/2020 FINAL  Final  Blood Culture (routine x 2)     Status: None (Preliminary result)   Collection Time: 11/21/20  4:40 PM   Specimen: BLOOD  Result Value Ref Range Status   Specimen Description   Final    BLOOD LEFT ANTECUBITAL Performed at [facility] [address], [place], [place] [address]    Special Requests   Final    BOTTLES DRAWN AEROBIC AND ANAEROBIC Blood Culture adequate volume Performed at [facility] [address], [place], [place] [address]    Culture   Final    NO GROWTH 2 DAYS Performed at [facility] [address] [place], [place] [address]    Report Status PENDING  Incomplete  Blood Culture (routine x 2)     Status: None (Preliminary result)   Collection Time: 11/21/20  4:45 PM   Specimen: BLOOD  Result Value Ref Range Status   Specimen Description   Final    BLOOD LEFT ANTECUBITAL Performed at [facility] [address], [place], [place] [address]    Special Requests   Final    BOTTLES DRAWN AEROBIC AND ANAEROBIC Blood Culture adequate volume Performed at [facility] [address] [place], [place] [address]    Culture  Setup Time   Final    GRAM POSITIVE COCCI IN CLUSTERS AEROBIC BOTTLE ONLY CRITICAL RESULT CALLED TO, READ BACK BY AND VERIFIED WITH: Shanon Rosser [id number] FCP Performed at [facility], [place] [address] [place], [place] [address]    Culture GRAM POSITIVE COCCI  Final   Report Status PENDING  Incomplete  Blood Culture ID Panel (Reflexed)      Status: None   Collection Time: 11/21/20  4:45 PM  Result Value Ref Range Status   Enterococcus faecalis NOT DETECTED NOT DETECTED Final   Enterococcus Faecium NOT DETECTED NOT DETECTED Final   Listeria monocytogenes NOT DETECTED NOT DETECTED Final   Staphylococcus species NOT DETECTED NOT DETECTED Final   Staphylococcus  aureus (BCID) NOT DETECTED NOT DETECTED Final   Staphylococcus epidermidis NOT DETECTED NOT DETECTED Final   Staphylococcus lugdunensis NOT DETECTED NOT DETECTED Final   Streptococcus species NOT DETECTED NOT DETECTED Final   Streptococcus agalactiae NOT DETECTED NOT DETECTED Final   Streptococcus pneumoniae NOT DETECTED NOT DETECTED Final   Streptococcus pyogenes NOT DETECTED NOT DETECTED Final   A.calcoaceticus-baumannii NOT DETECTED NOT DETECTED Final   Bacteroides fragilis NOT DETECTED NOT DETECTED Final   Enterobacterales NOT DETECTED NOT DETECTED Final   Enterobacter cloacae complex NOT DETECTED NOT DETECTED Final   Escherichia coli NOT DETECTED NOT DETECTED Final   Klebsiella aerogenes NOT DETECTED NOT DETECTED Final   Klebsiella oxytoca NOT DETECTED NOT DETECTED Final   Klebsiella pneumoniae NOT DETECTED NOT DETECTED Final   Proteus species NOT DETECTED NOT DETECTED Final   Salmonella species NOT DETECTED NOT DETECTED Final   Serratia marcescens NOT DETECTED NOT DETECTED Final   Haemophilus influenzae NOT DETECTED NOT DETECTED Final   Neisseria meningitidis NOT DETECTED NOT DETECTED Final   Pseudomonas aeruginosa NOT DETECTED NOT DETECTED Final   Stenotrophomonas maltophilia NOT DETECTED NOT DETECTED Final   Candida albicans NOT DETECTED NOT DETECTED Final   Candida auris NOT DETECTED NOT DETECTED Final   Candida glabrata NOT DETECTED NOT DETECTED Final   Candida krusei NOT DETECTED NOT DETECTED Final   Candida parapsilosis NOT DETECTED NOT DETECTED Final   Candida tropicalis NOT DETECTED NOT DETECTED Final   Cryptococcus neoformans/gattii NOT DETECTED NOT  DETECTED Final    Comment: Performed at [facility], [address], [place], [place] [address]  Resp Panel by RT-PCR (Flu A&B, Covid) Nasopharyngeal Swab     Status: None   Collection Time: 11/21/20  8:10 PM   Specimen: Nasopharyngeal Swab; Nasopharyngeal(NP) swabs in vial transport medium  Result Value Ref Range Status   SARS Coronavirus 2 by RT PCR NEGATIVE NEGATIVE Final    Comment: (NOTE) SARS-CoV-2 target nucleic acids are NOT DETECTED.  The SARS-CoV-2 RNA is generally detectable in upper respiratory specimens during the acute phase of infection. The lowest concentration of SARS-CoV-2 viral copies this assay can detect is 138 copies/mL. A negative result does not preclude SARS-Cov-2 infection and should not be used as the sole basis for treatment or other patient management decisions. A negative result may occur with  improper specimen collection/handling, submission of specimen other than nasopharyngeal swab, presence of viral mutation(s) within the areas targeted by this assay, and inadequate number of viral copies(<138 copies/mL). A negative result must be combined with clinical observations, patient history, and epidemiological information. The expected result is Negative.  Fact Sheet for Patients:  EntrepreneurPulse.com.au  Fact Sheet for Healthcare Providers:  IncredibleEmployment.be  This test is no t yet approved or cleared by the [place] FDA and  has been authorized for detection and/or diagnosis of SARS-CoV-2 by FDA under an Emergency Use Authorization (EUA). This EUA will remain  in effect (meaning this test can be used) for the duration of the COVID-19 declaration under Section 564(b)(1) of the Act, 21 U.S.C.section 360bbb-3(b)(1), unless the authorization is terminated  or revoked sooner.       Influenza A by PCR NEGATIVE NEGATIVE Final   Influenza B by PCR NEGATIVE NEGATIVE Final    Comment: (NOTE) The Xpert  Xpress SARS-CoV-2/FLU/RSV plus assay is intended as an aid in the diagnosis of influenza from Nasopharyngeal swab specimens and should not be used as a sole basis for treatment. Nasal washings and aspirates are  unacceptable for Xpert Xpress SARS-CoV-2/FLU/RSV testing.  Fact Sheet for Patients: EntrepreneurPulse.com.au  Fact Sheet for Healthcare Providers: IncredibleEmployment.be  This test is not yet approved or cleared by the [place] FDA and has been authorized for detection and/or diagnosis of SARS-CoV-2 by FDA under an Emergency Use Authorization (EUA). This EUA will remain in effect (meaning this test can be used) for the duration of the COVID-19 declaration under Section 564(b)(1) of the Act, 21 U.S.C. section 360bbb-3(b)(1), unless the authorization is terminated or revoked.  Performed at [facility], [place] [address] [place], [place] [address]    *Note: Due to a large number of results and/or encounters for the requested time period, some results have not been displayed. A complete set of results can be found in Results Review.    Studies/Results: CT Abdomen Pelvis W Contrast  Result Date: 11/21/2020 CLINICAL DATA:  Abdominal pain, acute abdominal pain in a 70 year old female. EXAM: CT ABDOMEN AND PELVIS WITH CONTRAST TECHNIQUE: Multidetector CT imaging of the abdomen and pelvis was performed using the standard protocol following bolus administration of intravenous contrast. CONTRAST:  163mL OMNIPAQUE IOHEXOL 300 MG/ML  SOLN COMPARISON:  August 07, 2020 FINDINGS: Lower chest: Small bilateral effusions and patchy airspace disease, in the dependent chest Hepatobiliary: Stable hepatic cyst. No pericholecystic stranding. No biliary duct dilation. Pancreas: Mild pancreatic atrophy. Spleen: Spleen normal size and contour. Adrenals/Urinary Tract: Adrenal glands with stable well-circumscribed adrenal nodule that measured 146  Hounsfield units on the previous exam post-contrast, 82 Hounsfield units on today's study, 1.4 cm greatest axial dimension. Unchanged. Symmetric renal enhancement. Mild RIGHT ureteral distension and collecting system distension. Minimal LEFT distension. Marked distension of the urinary bladder. Fluid about the bladder base, along the course of the LEFT ureter in the LEFT pelvis measuring approximately 5.4 x 4.9 cm, contiguous with fluid density in the extraperitoneal spaces of the pelvis but tracking towards the ileum and RIGHT lower quadrant and along the top of the urinary bladder. Fascial enhancement in the space of Retzius where fluid extends along approximately 12 cm by 1.5 cm of the anterior lower pelvis. No signs of ascites in the upper abdomen. Enhancement of the urinary bladder wall with subtle areas of limited enhancement seen along the margin of the bladder wall in this patient with reported history of bladder injury at the time of hysterectomy. Defect in bladder wall enhancement seen on image 98 of series 5. Density of fluid is simple fluid density. Urinary bladder is markedly distended with fluid tracking mainly on the RIGHT and posterior of the urinary bladder in addition to anteriorly in the space of Retzius. Stomach/Bowel: Stomach with small hiatal hernia. Large duodenal diverticulum in the periampullary region of the duodenum. Secondary inflammation of the ileum with fluid adjacent to it displacing the terminal ileum in the RIGHT lower quadrant. This is the only area where the possibility of intraperitoneal extension is entertained though this may be tracking along RIGHT pelvic sidewall and into the lower margin of the posterior pararenal space based on position of adjacent ureter and ovarian vein. The distal ileum was in close proximity to the RIGHT ovary on previous imaging perhaps closely related to the broad ligament. No acute colonic process. No extravasation of ingested contrast media.  Vascular/Lymphatic: Calcified atheromatous plaque in the abdominal aorta. No aneurysmal dilation. Smooth contour of the IVC. There is no gastrohepatic or hepatoduodenal ligament lymphadenopathy. No retroperitoneal or mesenteric lymphadenopathy. No pelvic sidewall lymphadenopathy. Reproductive: Post hysterectomy by report, potentially supracervical. Also with  bilateral sacral colpopexy by report. Other: No ascites about the liver or the RIGHT or LEFT paracolic gutter. No free air. Musculoskeletal: No acute bone finding. No destructive bone process. Spinal degenerative changes. IMPRESSION: 1. Suspected defect in the posterior urinary bladder with intraperitoneal fluid about the urinary bladder and signs of urinary retention. Urologic consultation is suggested for decompression of the urinary bladder and further assessment a bladder leak. 2. Position of distal RIGHT ureter with respect to the suspected bladder wall defect does not allow for complete exclusion of distal ureteral injury attention on follow-up is suggested. 3. Mild RIGHT greater than LEFT hydronephrosis likely related to markedly distended urinary bladder. 4. Small bilateral effusions and patchy airspace disease, irregular appearance raises the question of infection. Correlate with any respiratory symptoms. 5. Stable 1.4 cm well-circumscribed nodule in the right adrenal gland. This measured 146 Hounsfield units on the previous exam post-contrast, 82 Hounsfield units on today's study. Given density on the previous imaging study, follow-up adrenal protocol may be helpful as pheochromocytoma can show increased density on venous phase, greater than 130 Hounsfield units on the prior study, therefore, biochemical correlation may also be warranted. 6. Aortic atherosclerosis. These results were called by telephone at the time of interpretation on 11/21/2020 at 6:46 pm to provider Dr. Kathrynn Humble, who verbally acknowledged these results. Aortic Atherosclerosis  (ICD10-I70.0). Electronically Signed   By: Zetta Bills M.D.   On: 11/21/2020 18:40   CT CYSTOGRAM PELVIS  Result Date: 11/21/2020 CLINICAL DATA:  70 year old female with bladder rupture. EXAM: CT CYSTOGRAM (CT PELVIS WITH CONTRAST) TECHNIQUE: Multidetector CT imaging through the pelvis was performed after dilute contrast had been introduced into the bladder for the purposes of performing CT cystography. CONTRAST:  37mL OMNIPAQUE IOHEXOL 300 MG/ML  SOLN COMPARISON:  CT abdomen pelvis dated 11/21/2020. FINDINGS: Urinary Tract: There is a 16 mm defect in the posterior bladder wall to the right of the midline with extravesical extension of contrast consistent with bladder rupture. The extraluminal contrast appears primarily extraperitoneal and within the pelvis. A Foley catheter is noted with balloon within the bladder. Air within the bladder likely introduced via the catheter. Bowel:  Sigmoid diverticulosis.  No bowel dilatation. Vascular/Lymphatic: Mild atherosclerotic calcification of the visualized aorta. Reproductive:  The uterus is retroverted and grossly unremarkable. Other:  Mild subcutaneous edema. Musculoskeletal: No acute findings. IMPRESSION: 1. Posterior bladder rupture, appears extraperitoneal. 2. Aortic Atherosclerosis (ICD10-I70.0). Electronically Signed   By: Anner Crete M.D.   On: 11/21/2020 22:23    Assessment: Readmitted with urinary retention/hydronephrosis and extraperitoneal bladder perforation - significantly better with foley catheter in terms of pain and symptoms.  Hydro resolved on U/S.  Plan: D/c home with catheter  today PO abx.   Louis Meckel, MD Urology 11/23/2020, 10:35 AM

## 2020-11-25 LAB — CULTURE, BLOOD (ROUTINE X 2): Special Requests: ADEQUATE

## 2020-11-26 ENCOUNTER — Telehealth: Payer: Self-pay

## 2020-11-26 LAB — CULTURE, BLOOD (ROUTINE X 2)
Culture: NO GROWTH
Special Requests: ADEQUATE

## 2020-11-26 NOTE — Telephone Encounter (Signed)
Transition Care Management Follow-up Telephone Call  Date of discharge and from where: 11/23/2020[facility]  How have you been since you were released from the hospital? Not too good but feeling better today.. Having some diarrhea from the antibiotics.  Any questions or concerns? No  Items Reviewed:  Did the pt receive and understand the discharge instructions provided? Yes   Medications obtained and verified? Yes   Other? Yes   Any new allergies since your discharge? No   Dietary orders reviewed? Yes  Do you have support at home? Yes   Home Care and Equipment/Supplies: Were home health services ordered? no If so, what is the name of the agency? n/a  Has the agency set up a time to come to the patient's home? not applicable Were any new equipment or medical supplies ordered?  No What is the name of the medical supply agency? n/a Were you able to get the supplies/equipment? not applicable Do you have any questions related to the use of the equipment or supplies? n/a  Functional Questionnaire: (I = Independent and D = Dependent) ADLs: I  Bathing/Dressing- I  Meal Prep- I with assistance  Eating- i  Maintaining continence- I  Transferring/Ambulation- I  Managing Meds- I  Follow up appointments reviewed:   PCP Hospital f/u appt confirmed? Yes  Scheduled to see Dr. Gena Fray on 12/03/20 @ 11:00.  [facility] f/u appt confirmed? Yes  Scheduled to see Dr. Shirley Muscat 12/04/2020 @ 8:00.  Are transportation arrangements needed? No   If their condition worsens, is the pt aware to call PCP or go to the Emergency Dept.? Yes  Was the patient provided with contact information for the PCP's office or ED? Yes  Was to pt encouraged to call back with questions or concerns? Yes

## 2020-12-03 ENCOUNTER — Inpatient Hospital Stay: Payer: Medicare HMO | Admitting: Family Medicine

## 2020-12-04 ENCOUNTER — Other Ambulatory Visit: Payer: Self-pay | Admitting: Family Medicine

## 2020-12-04 DIAGNOSIS — F341 Dysthymic disorder: Secondary | ICD-10-CM

## 2020-12-04 DIAGNOSIS — N819 Female genital prolapse, unspecified: Secondary | ICD-10-CM | POA: Diagnosis not present

## 2020-12-06 ENCOUNTER — Other Ambulatory Visit: Payer: Self-pay | Admitting: Family Medicine

## 2020-12-07 DIAGNOSIS — N819 Female genital prolapse, unspecified: Secondary | ICD-10-CM | POA: Diagnosis not present

## 2020-12-07 DIAGNOSIS — R8271 Bacteriuria: Secondary | ICD-10-CM | POA: Diagnosis not present

## 2020-12-10 ENCOUNTER — Ambulatory Visit (INDEPENDENT_AMBULATORY_CARE_PROVIDER_SITE_OTHER): Payer: Medicare HMO | Admitting: Family Medicine

## 2020-12-10 ENCOUNTER — Encounter: Payer: Self-pay | Admitting: Family Medicine

## 2020-12-10 ENCOUNTER — Other Ambulatory Visit: Payer: Self-pay

## 2020-12-10 VITALS — BP 130/68 | HR 74 | Temp 98.1°F | Ht 66.0 in | Wt 137.4 lb

## 2020-12-10 DIAGNOSIS — J4520 Mild intermittent asthma, uncomplicated: Secondary | ICD-10-CM | POA: Diagnosis not present

## 2020-12-10 DIAGNOSIS — N3289 Other specified disorders of bladder: Secondary | ICD-10-CM

## 2020-12-10 DIAGNOSIS — E782 Mixed hyperlipidemia: Secondary | ICD-10-CM

## 2020-12-10 DIAGNOSIS — K581 Irritable bowel syndrome with constipation: Secondary | ICD-10-CM

## 2020-12-10 MED ORDER — LINACLOTIDE 72 MCG PO CAPS: 72.0000 ug | ORAL_CAPSULE | Freq: Every day | ORAL | 2 refills | Status: DC

## 2020-12-10 MED ORDER — ALBUTEROL SULFATE HFA 108 (90 BASE) MCG/ACT IN AERS
2.0000 | INHALATION_SPRAY | Freq: Four times a day (QID) | RESPIRATORY_TRACT | 0 refills | Status: DC | PRN
Start: 2020-12-10 — End: 2021-01-01

## 2020-12-10 NOTE — Progress Notes (Signed)
[facility] [address] [place] [address] Dept: [phone] Dept Fax: [phone]  Office Visit  Subjective:    Patient ID: Megan [name], female    DOB: 04/29/1951, 70 y.o..   MRN: [id number]  Chief Complaint  Patient presents with  . Hospitalization Follow-up    F/u hospital for UTI on 11/21/20. She states she still feels weak and wants a lower dose of Linzess.  She needs a refill on the Albuterol.      History of Present Illness:  Patient is in today for hospital follow-up. Megan [name] underwent robot-assisted supracervical hysterectomy, salpingo-oophorectomy and sacrocolpopexy on 11/15/2020. She returned to the hospital on 11/21/2020 with an extraperitoneal bladder rupture and bilateral hydronephrosis. This was felt to be a possible complication from her surgery vs. spontaneous rupture. She was treated with antibiotics and a bladder catheter. She had her catheter taken out 7 days ago. She notes that the antibiotics have increased some of her GI distress related to her IBS. She had been using her laxatives to relieve constipation. Last night, she took her Linzess and now has had more profuse diarrhea. She wonders about taking a lower dose.  We tried Megan [name] on atorvastatin, despite past intolerance of rosuvastatin. She notes she did stop taking the Lipitro as it was not agreeing with her.  Megan [name] has a history of asthma. She notes her albuterol is out of date and would like this renewed. She has had no recent wheezing.  Past Medical History: Patient Active Problem List   Diagnosis Date Noted  . Bladder rupture 12/10/2020  . Bladder injury 11/22/2020  . Urinary retention 11/21/2020  . Cystocele with prolapse 11/15/2020  . Asthma 11/05/2020  . Uterine prolapse 11/05/2020  . Episodic cluster headache, not intractable 09/14/2020  . Chronic left-sided headache 08/09/2020  . Cerebral vascular disease  08/09/2020  . Depression with anxiety 08/09/2020  . 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 Closed fracture of right distal radius 01/12/2019  . Moderate obstructive sleep apnea 05/21/2017  . Vitamin D deficiency 07/17/2016  . Abdominal aortic atherosclerosis ([place]) 07/16/2016  . Renal artery stenosis ([place]) 06/12/2016  . Chronic low back pain 08/24/2015  . Hyperlipidemia, mixed 12/17/2014  . Hepatic cyst 05/22/2014  . Insomnia 03/10/2014  . 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 Osteoporosis 09/07/2012  . 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 Tricuspid regurgitation, Mild-Moderate 07/21/2011  . Mitral regurgitation, Trivial 07/21/2011  . Gastroesophageal reflux disease with hiatal hernia 06/17/2011  . Endometrial hyperplasia   . Diverticulosis   . IBS (irritable bowel syndrome) with constipation 04/24/2011  . Barrett's esophagus 03/04/2011  . Hiatal hernia 03/04/2011  . Basal cell carcinoma (BCC) of face 11/15/2009  . Vitamin B12 deficiency 02/15/2009  . 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 Essential hypertension 01/04/2009   Past Surgical History:  Procedure Laterality Date  . APPENDECTOMY    . endoscopic hemoclip    . eye surgery Left    lens implant  . HYSTEROSCOPY     POLYP  . OPEN REDUCTION INTERNAL FIXATION (ORIF) DISTAL RADIAL FRACTURE Left 01/17/2019   Procedure: LEFT OPEN REDUCTION INTERNAL FIXATION (ORIF) DISTAL RADIUS FRACTURE;  Surgeon: Daryll Brod, MD;  Location: [facility];  Service: Orthopedics;  Laterality: Left;  AXILLARY BLOCK  . PERIPHERAL VASCULAR CATHETERIZATION Left 07/24/2016   Procedure: Renal Angiography;  Surgeon: Conrad Fillmore, MD;  Location: [facility];  Service: Cardiovascular;  Laterality: Left;  . ROBOTIC ASSISTED  BILATERAL SALPINGO OOPHERECTOMY  11/15/2020  . ROBOTIC ASSISTED LAPAROSCOPIC SACROCOLPOPEXY Bilateral 11/15/2020   Procedure: XI ROBOTIC ASSISTED LAPAROSCOPIC SACROCOLPOPEXY AND SUPRACERVICIAL HYSTERECTOMY BILATERAL  SALPINGO-OOPHERECTOMY;  Surgeon: Ardis Hughs, MD;  Location: [facility];  Service: Urology;  Laterality: Bilateral;  REQUESTING 4.5 HRS  . ROBOTIC ASSISTED TOTAL HYSTERECTOMY  11/15/2020  . UPPER GI ENDOSCOPY    . UTERINE FIBROID SURGERY     Family History  Problem Relation Age of Onset  . Colon cancer Mother   . Diabetes Mother   . Hypertension Mother   . Colon polyps Father   . Parkinson's disease Father   . Colon cancer Maternal Grandmother   . Breast cancer Maternal Grandmother 31  . Diabetes Paternal Grandmother   . Breast cancer Paternal Grandmother 28  . Allergies Sister   . Arthritis Brother        b/l hip replace  . Alcohol abuse Daughter   . Arthritis Son   . Hypertension Son   . Allergies Son   . Osteoporosis Son   . Osteoporosis Son   . Arthritis Son        back disease, DDD  . Allergies Son   . Arthritis Son   . Irritable bowel syndrome Son        RSD  . Hypertension Paternal Grandfather   . Heart disease Paternal Grandfather   . Aneurysm Paternal Grandfather   . Colon cancer Maternal Aunt   . Colon cancer Cousin        X3  . Ovarian cancer Cousin    Outpatient Medications Prior to Visit  Medication Sig Dispense Refill  . ALPRAZolam (XANAX) 1 MG tablet TAKE 1 TABLET BY MOUTH TWICE A DAY AS NEEDED 30 tablet 2  . azelastine (ASTELIN) 0.1 % nasal spray Place 2 sprays into both nostrils 2 (two) times daily. Use in each nostril as directed 30 mL 3  . diltiazem (CARTIA XT) 180 MG 24 hr capsule Take 1 capsule (180 mg total) by mouth daily. 90 capsule 3  . docusate sodium (COLACE) 100 MG capsule Take 1 capsule (100 mg total) by mouth 2 (two) times daily.    Marland Kitchen estradiol (ESTRACE VAGINAL) 0.1 MG/GM vaginal cream Place 1 Applicatorful vaginally 3 (three) times a week. Use 1 small dolyp of cream on tip of index finger and swap the inside of the vagina 42.5 g 1  . gabapentin (NEURONTIN) 100 MG capsule Take 1 capsule (100 mg total) by mouth 3 (three) times daily. 90  capsule 3  . metoprolol succinate (TOPROL-XL) 25 MG 24 hr tablet Take 1 tablet (25 mg total) by mouth daily. Please make yearly appt with Dr. Burt Knack for April 2022 for future refills. Thank you 1st attempt 90 tablet 0  . omeprazole (PRILOSEC) 40 MG capsule Take 40 mg by mouth in the morning and at bedtime.    . ondansetron (ZOFRAN) 4 MG tablet Take 4 mg by mouth every 8 (eight) hours as needed for nausea or vomiting.    . phenazopyridine (PYRIDIUM) 200 MG tablet Take 200 mg by mouth 3 (three) times daily as needed for pain.    . polyethylene glycol (MIRALAX / GLYCOLAX) 17 g packet Take 17 g by mouth daily. 14 each 0  . promethazine (PHENERGAN) 25 MG tablet Take 25 mg by mouth every 6 (six) hours as needed for nausea or vomiting.    . sucralfate (CARAFATE) 1 g tablet TAKE 1 TABLET BY MOUTH FOUR TIMES A DAY AS NEEDED    .  tiZANidine (ZANAFLEX) 4 MG tablet Take 1 tablet (4 mg total) by mouth at bedtime. 30 tablet 0  . traMADol (ULTRAM) 50 MG tablet Take 1-2 tablets (50-100 mg total) by mouth every 6 (six) hours as needed for moderate pain or severe pain. 20 tablet 0  . albuterol (VENTOLIN HFA) 108 (90 Base) MCG/ACT inhaler Inhale 2 puffs into the lungs every 6 (six) hours as needed for wheezing or shortness of breath. 8 g 0  . linaclotide (LINZESS) 145 MCG CAPS capsule Take 145 mcg by mouth daily before breakfast.    . atorvastatin (LIPITOR) 20 MG tablet Take 1 tablet (20 mg total) by mouth daily. (Patient not taking: No sig reported) 90 tablet 3  . nabumetone (RELAFEN) 500 MG tablet TAKE 1 TABLET BY MOUTH TWICE A DAY AS NEEDED (Patient not taking: Reported on 12/10/2020) 60 tablet 3   No facility-administered medications prior to visit.   Allergies  Allergen Reactions  . Sulfamethoxazole Shortness Of Breath    chest tightness  . Amitriptyline Anxiety and Other (See Comments)    Depression, insomnia  . Effexor [Venlafaxine]     Palpitations, hypertension  . Lactose Intolerance (Gi) Diarrhea and  Nausea And Vomiting  . Lexapro [Escitalopram Oxalate]     nausea  . Prednisone Palpitations      Objective:   Today's Vitals   12/10/20 1311  BP: 130/68  Pulse: 74  Temp: 98.1 F (36.7 C)  TempSrc: Temporal  SpO2: 98%  Weight: 137 lb 6.4 oz (62.3 kg)  Height: 5\' 6"  (1.676 m)   Body mass index is 22.18 kg/m.   General: Well developed, well nourished. No acute distress. Psych: Alert and oriented x3. Normal mood and affect.  Health Maintenance Due  Topic Date Due  . Hepatitis C Screening  Never done  . COVID-19 Vaccine (1) Never done     Assessment & Plan:   1. Bladder rupture Reviewed hospital discharge summaries form both her surgical admission and her readmission related to the bladder rupture. She is under the care of the urologist for the rupture and appears to be healing well at this point.  2. Irritable bowel syndrome with constipation Due to the diarrheal response she had with the Linzess, we will try her on a lower dosage to see if this works to the relieve the constipation without causing diarrhea.  - linaclotide (LINZESS) 72 MCG capsule; Take 1 capsule (72 mcg total) by mouth daily before breakfast.  Dispense: 30 capsule; Refill: 2  3. Mild intermittent asthma without complication Stable. I will renew her albuterol inhaler.  - albuterol (VENTOLIN HFA) 108 (90 Base) MCG/ACT inhaler; Inhale 2 puffs into the lungs every 6 (six) hours as needed for wheezing or shortness of breath.  Dispense: 8 g; Refill: 0  4. Hyperlipidemia, mixed Megan [name] is not tolerating statins. I offered to refer her to [facility]. She was hesitant to try that approach at present. She asked about natural approaches to lower her cholesterol. We discussed red yeast rice as a natural alternative. She will also paln to discuss this further with her cardiologist at an upcoming appointment.  Haydee Salter, MD

## 2020-12-11 NOTE — Progress Notes (Signed)
Cardiology Office Note:    Date:  12/12/2020   ID:  Megan [name], DOB [birth year], MRN [id number]  PCP:  Haydee Salter, MD   [facility]  Cardiologist:  Sherren Mocha, MD   Electrophysiologist:  None       Referring MD: Brunetta Jeans, PA-C   Chief Complaint:  Follow-up (TR, HTN, HLD)    Patient Profile:    Megan [name] is a 70 y.o. female with:   Chest pain   Palpitations   Monitor 5/21: no significant arrhythmias   Mild to mod TR  Hypertension   Hyperlipidemia   Intol of statins   GERD   Hypothyroidism   Prior CV studies: LONG TERM MONITOR (3-7 DAYS) INTERPRETATION 01/03/2020 Narrative 1. The basic rhythm is normal sinus with an average HR of 78 bpm 2. No atrial fibrillation or flutter 3. No high-grade heart block or pathologic pauses 4. There are no PVC's and rare supraventricular beats without sustained arrhythmias 5. There is a short run of PSVT, 9 beats Overall benign monitor result  Echocardiogram 12/05/19 EF 60-65, no RWMA, normal RVSF, mildly elevated PASP, RVSP 34.6, mild LAE, trivial MR, mild-moderate TR  GATED SPECT MYO PERF W/EXERCISE STRESS 1D 07/09/2016 Narrative  Nuclear stress EF: 83%.  The patient walked for 4 minutes of a Bruce protocol. Peak HR of 185 whicih is 97% PMHR. RBBB. No ST or T wave changes.  The study is normal.  This is a low risk study.  The left ventricular ejection fraction is hyperdynamic (>65%).  History of Present Illness: [name] was last seen by Dr. Burt Knack in 4/21.  She was recently hospitalized with bladder rupture after a recent hysterectomy.  She has been home ~ 2 weeks.  She still has a lot of pain but is getting better.  Over the last couple weeks, she has noted some chest pressure from time to time.  This is not related to exertion.  She has not had significant shortness of breath.  She has not had orthopnea or leg edema.  She has not had syncope.  Last  summer, she had a lot of palpitations.  She has been taking metoprolol succinate twice daily since that time.  She has not had any further palpitations.        Past Medical History:  Diagnosis Date  . 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 Adrenal gland cyst ([place]) 05/22/2014  . Allergic state 12/31/2012  . Anxiety   . Asthma    triggerred by fragrances   . Barrett esophagus   . Blind loop syndrome   . C. difficile diarrhea   . Cancer [facility])    pt denies   . Chest pain, atypical 07/14/2011  . Cold sore 07/17/2016  . Colon polyp   . Contact dermatitis 03/23/2012  . Cystocele, midline   . Depression   . Diarrhea   . Diverticulosis   . Endometrial hyperplasia 02/27/2006   BENIGN ENDO BX ON 02/2007  . GERD (gastroesophageal reflux disease)   . Headache 04/16/2015  . Headache   . Headache(784.0) 04/15/2012   pt denies   . Heart murmur   . Hematuria 04/27/2012  . Hepatic cyst   . Hiatal hernia   . Hiatal hernia   . Hyperlipidemia, mixed 12/17/2014  . Hypertension   . IBS (irritable bowel syndrome)   . Insomnia   . Leaky heart valve   . Low back pain 08/15/2013  . Osteoporosis 03/2017   T  score -2.5. Without significant loss from prior studies  . 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 Paronychia of great toe, left 06/19/2013  . Preventative health care 09/11/2014  . Rectal bleeding 10/10/2011  . Rectocele   . 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 Sinusitis acute 02/03/2012  . Thrush 07/21/2011  . 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 Unspecified hypothyroidism   . Unspecified menopausal and postmenopausal disorder   . Uterine prolapse without mention of vaginal wall prolapse   . Vitamin B12 deficiency   . Vitamin D deficiency 07/17/2016    Current Medications: Current Meds  Medication Sig  . albuterol (VENTOLIN HFA) 108 (90 Base) MCG/ACT inhaler Inhale 2 puffs into the lungs every 6 (six) hours as needed for wheezing or shortness of breath.  . ALPRAZolam (XANAX) 1 MG tablet TAKE 1 TABLET BY MOUTH  TWICE A DAY AS NEEDED  . azelastine (ASTELIN) 0.1 % nasal spray Place 2 sprays into both nostrils 2 (two) times daily. Use in each nostril as directed  . diltiazem (CARTIA XT) 180 MG 24 hr capsule Take 1 capsule (180 mg total) by mouth daily.  Marland Kitchen docusate sodium (COLACE) 100 MG capsule Take 1 capsule (100 mg total) by mouth 2 (two) times daily.  Marland Kitchen estradiol (ESTRACE VAGINAL) 0.1 MG/GM vaginal cream Place 1 Applicatorful vaginally 3 (three) times a week. Use 1 small dolyp of cream on tip of index finger and swap the inside of the vagina  . gabapentin (NEURONTIN) 100 MG capsule Take 100 mg by mouth as needed (for pain).  Marland Kitchen linaclotide (LINZESS) 72 MCG capsule Take 1 capsule (72 mcg total) by mouth daily before breakfast.  . metoprolol succinate (TOPROL-XL) 25 MG 24 hr tablet Take 1 tablet (25 mg total) by mouth daily. Please make yearly appt with Dr. Burt Knack for April 2022 for future refills. Thank you 1st attempt  . omeprazole (PRILOSEC) 40 MG capsule Take 40 mg by mouth in the morning and at bedtime.  . ondansetron (ZOFRAN) 4 MG tablet Take 4 mg by mouth every 8 (eight) hours as needed for nausea or vomiting.  . phenazopyridine (PYRIDIUM) 200 MG tablet Take 200 mg by mouth 3 (three) times daily as needed for pain.  . polyethylene glycol (MIRALAX / GLYCOLAX) 17 g packet Take 17 g by mouth daily.  . promethazine (PHENERGAN) 25 MG tablet Take 25 mg by mouth every 6 (six) hours as needed for nausea or vomiting.  . sucralfate (CARAFATE) 1 g tablet TAKE 1 TABLET BY MOUTH FOUR TIMES A DAY AS NEEDED  . tiZANidine (ZANAFLEX) 4 MG tablet Take 1 tablet (4 mg total) by mouth at bedtime.  . traMADol (ULTRAM) 50 MG tablet Take 1-2 tablets (50-100 mg total) by mouth every 6 (six) hours as needed for moderate pain or severe pain.     Allergies:   Sulfamethoxazole, Amitriptyline, Effexor [venlafaxine], Lactose intolerance (gi), Lexapro [escitalopram oxalate], and Prednisone   Social History   Tobacco Use  .  Smoking status: Former Smoker    Quit date: 08/19/1995    Years since quitting: 25.3  . Smokeless tobacco: Never Used  Vaping Use  . Vaping Use: Never used  Substance Use Topics  . Alcohol use: Never    Alcohol/week: 0.0 standard drinks  . Drug use: Never     Family Hx: The patient's family history includes Alcohol abuse in her daughter; Allergies in her sister, son, and son; Aneurysm in her paternal grandfather; Arthritis in her brother, son, son, and son; Breast cancer (age of  onset: 12) in her paternal grandmother; Breast cancer (age of onset: 54) in her maternal grandmother; Colon cancer in her cousin, maternal aunt, maternal grandmother, and mother; Colon polyps in her father; Diabetes in her mother and paternal grandmother; Heart disease in her paternal grandfather; Hypertension in her mother, paternal grandfather, and son; Irritable bowel syndrome in her son; Osteoporosis in her son and son; Ovarian cancer in her cousin; Parkinson's disease in her father.  ROS   EKGs/Labs/Other Test Reviewed:    EKG:  EKG is not ordered today.  The ekg ordered today demonstrates n/a Electrocardiogram from 11/21/2020 personally reviewed and interpreted.  This demonstrated sinus rhythm, heart rate 86, right bundle branch block and nonspecific ST-T wave changes.    Recent Labs: 06/26/2020: TSH 0.79 07/24/2020: ALT 15 11/23/2020: BUN 8; Creatinine, Ser 0.59; Hemoglobin 9.7; Platelets 255; Potassium 3.4; Sodium 135   Recent Lipid Panel Lab Results  Component Value Date/Time   CHOL 235 (H) 11/05/2020 09:28 AM   CHOL 142 07/21/2019 10:03 AM   TRIG 115.0 11/05/2020 09:28 AM   HDL 57.90 11/05/2020 09:28 AM   HDL 50 07/21/2019 10:03 AM   CHOLHDL 4 11/05/2020 09:28 AM   LDLCALC 154 (H) 11/05/2020 09:28 AM   LDLCALC 70 07/21/2019 10:03 AM   LDLDIRECT 165.0 06/30/2011 11:53 AM      Risk Assessment/Calculations:      The 10-year ASCVD risk score Mikey Bussing DC Brooke Bonito., et al., 2013) is: 15.5%   Values used to  calculate the score:     Age: 39 years     Sex: Female     Is Non-Hispanic African American: No     Diabetic: No     Tobacco smoker: No     Systolic Blood Pressure: 035 mmHg     Is BP treated: Yes     HDL Cholesterol: 57.9 mg/dL     Total Cholesterol: 235 mg/dL   Physical Exam:    VS:  BP (!) 140/58   Pulse 87   Ht 5\' 6"  (1.676 m)   Wt 136 lb 3.2 oz (61.8 kg)   SpO2 97%   BMI 21.98 kg/m     Wt Readings from Last 3 Encounters:  12/12/20 136 lb 3.2 oz (61.8 kg)  12/10/20 137 lb 6.4 oz (62.3 kg)  11/21/20 140 lb (63.5 kg)     Constitutional:      Appearance: Healthy appearance. Not in distress.  Neck:     Vascular: JVD normal.  Pulmonary:     Effort: Pulmonary effort is normal.     Breath sounds: No wheezing. No rales.  Cardiovascular:     Normal rate. Regular rhythm. Normal S1. Normal S2.     Murmurs: There is no murmur.  Edema:    Peripheral edema absent.  Abdominal:     Palpations: Abdomen is soft.  Skin:    General: Skin is warm and dry.  Neurological:     Mental Status: Alert and oriented to person, place and time.     Cranial Nerves: Cranial nerves are intact.          ASSESSMENT & PLAN:    1. Tricuspid valve insufficiency, unspecified etiology Mild to moderate tricuspid regurgitation on echocardiogram 4/21.  Plan is to repeat echocardiogram in 2 to 3 years.  I will bring her back next year for routine follow-up with an echocardiogram prior to that appointment.  Continue current management.  2. Palpitations Overall well controlled on beta-blocker therapy.  Continue current dose of diltiazem  and metoprolol succinate.  3. Essential hypertension Blood pressure somewhat elevated.  She just got the hospital after pelvic surgery.  She still has some discomfort.  I have asked her to continue to monitor blood pressure the next couple weeks and send me those readings for review.  For now, continue current dose of diltiazem, metoprolol succinate.  4. Mixed  hyperlipidemia 10-year ASCVD risk is 15.5%.  She has not been able to tolerate statin drugs due to gastrointestinal side effects.  She has a lot of GI issues and is followed at [facility].  I am not certain she would be able to tolerate ezetimibe.  She may be a good candidate for bempedoic acid or PCSK9 inhibitor therapy.  I will refer her to our Pharm.D. lipid clinic for further evaluation and management.  5. Precordial chest pain I suspect her symptoms are noncardiac and related to her gastrointestinal issues in the setting of recent pelvic surgery.  I have asked her to continue to monitor symptoms over the next couple of weeks.  If they resolve, no further testing will be needed.  If her symptoms continue, I will set her up for stress testing.  She would need a [employer] and we did go over risks and benefits today.     Dispo:  Return in about 1 year (around 12/12/2021) for Routine Follow Up, w/ Dr. Burt Knack, in person.   Medication Adjustments/Labs and Tests Ordered: Current medicines are reviewed at length with the patient today.  Concerns regarding medicines are outlined above.  Tests Ordered: Orders Placed This Encounter  Procedures  . AMB Referral to [facility]  . ECHOCARDIOGRAM COMPLETE   Medication Changes: No orders of the defined types were placed in this encounter.   Signed, Dobies Dopp, PA-C  12/12/2020 11:31 AM    [facility] [address], [place], [place]  [address] Phone: [phone]; Fax: [phone]

## 2020-12-12 ENCOUNTER — Other Ambulatory Visit: Payer: Self-pay

## 2020-12-12 ENCOUNTER — Ambulatory Visit: Payer: Medicare HMO | Admitting: Physician Assistant

## 2020-12-12 ENCOUNTER — Encounter: Payer: Self-pay | Admitting: Physician Assistant

## 2020-12-12 VITALS — BP 140/58 | HR 87 | Ht 66.0 in | Wt 136.2 lb

## 2020-12-12 DIAGNOSIS — R002 Palpitations: Secondary | ICD-10-CM

## 2020-12-12 DIAGNOSIS — I071 Rheumatic tricuspid insufficiency: Secondary | ICD-10-CM

## 2020-12-12 DIAGNOSIS — I10 Essential (primary) hypertension: Secondary | ICD-10-CM

## 2020-12-12 DIAGNOSIS — R072 Precordial pain: Secondary | ICD-10-CM | POA: Diagnosis not present

## 2020-12-12 DIAGNOSIS — E782 Mixed hyperlipidemia: Secondary | ICD-10-CM | POA: Diagnosis not present

## 2020-12-12 NOTE — Patient Instructions (Signed)
Medication Instructions:  Your physician recommends that you continue on your current medications as directed. Please refer to the Current Medication list given to you today.  *If you need a refill on your cardiac medications before your next appointment, please call your pharmacy*   Lab Work: NONE  If you have labs (blood work) drawn today and your tests are completely normal, you will receive your results only by: Marland Kitchen MyChart Message (if you have MyChart) OR . A paper copy in the mail If you have any lab test that is abnormal or we need to change your treatment, we will call you to review the results.   Testing/Procedures: Your physician has requested that you have an echocardiogram.Wednesday, December 04 2021 @ 10:30 am.  Echocardiography is a painless test that uses sound waves to create images of your heart. It provides your doctor with information about the size and shape of your heart and how well your heart's chambers and valves are working. This procedure takes approximately one hour. There are no restrictions for this procedure.   Echocardiogram An echocardiogram is a test that uses sound waves (ultrasound) to produce images of the heart. Images from an echocardiogram can provide important information about:  Heart size and shape.  The size and thickness and movement of your heart's walls.  Heart muscle function and strength.  Heart valve function or if you have stenosis. Stenosis is when the heart valves are too narrow.  If blood is flowing backward through the heart valves (regurgitation).  A tumor or infectious growth around the heart valves.  Areas of heart muscle that are not working well because of poor blood flow or injury from a heart attack.  Aneurysm detection. An aneurysm is a weak or damaged part of an artery wall. The wall bulges out from the normal force of blood pumping through the body. Tell a health care provider about:  Any allergies you have.  All  medicines you are taking, including vitamins, herbs, eye drops, creams, and over-the-counter medicines.  Any blood disorders you have.  Any surgeries you have had.  Any medical conditions you have.  Whether you are pregnant or may be pregnant. What are the risks? Generally, this is a safe test. However, problems may occur, including an allergic reaction to dye (contrast) that may be used during the test. What happens before the test? No specific preparation is needed. You may eat and drink normally. What happens during the test?  You will take off your clothes from the waist up and put on a hospital gown.  Electrodes or electrocardiogram (ECG)patches may be placed on your chest. The electrodes or patches are then connected to a device that monitors your heart rate and rhythm.  You will lie down on a table for an ultrasound exam. A gel will be applied to your chest to help sound waves pass through your skin.  A handheld device, called a transducer, will be pressed against your chest and moved over your heart. The transducer produces sound waves that travel to your heart and bounce back (or "echo" back) to the transducer. These sound waves will be captured in real-time and changed into images of your heart that can be viewed on a video monitor. The images will be recorded on a computer and reviewed by your health care provider.  You may be asked to change positions or hold your breath for a short time. This makes it easier to get different views or better views of your  heart.  In some cases, you may receive contrast through an IV in one of your veins. This can improve the quality of the pictures from your heart. The procedure may vary among health care providers and hospitals.   What can I expect after the test? You may return to your normal, everyday life, including diet, activities, and medicines, unless your health care provider tells you not to do that. Follow these instructions at  home:  It is up to you to get the results of your test. Ask your health care provider, or the department that is doing the test, when your results will be ready.  Keep all follow-up visits. This is important. Summary  An echocardiogram is a test that uses sound waves (ultrasound) to produce images of the heart.  Images from an echocardiogram can provide important information about the size and shape of your heart, heart muscle function, heart valve function, and other possible heart problems.  You do not need to do anything to prepare before this test. You may eat and drink normally.  After the echocardiogram is completed, you may return to your normal, everyday life, unless your health care provider tells you not to do that. This information is not intended to replace advice given to you by your health care provider. Make sure you discuss any questions you have with your health care provider. Document Revised: 03/27/2020 Document Reviewed: 03/27/2020 Elsevier Patient Education  2021 [place]: At [facility], you and your health needs are our priority.  As part of our continuing mission to provide you with exceptional heart care, we have created designated Provider Care Teams.  These Care Teams include your primary Cardiologist (physician) and Advanced Practice Providers (APPs -  Physician Assistants and Nurse Practitioners) who all work together to provide you with the care you need, when you need it.  We recommend signing up for the patient portal called "MyChart".  Sign up information is provided on this After Visit Summary.  MyChart is used to connect with patients for Virtual Visits (Telemedicine).  Patients are able to view lab/test results, encounter notes, upcoming appointments, etc.  Non-urgent messages can be sent to your provider as well.   To learn more about what you can do with MyChart, go to [email or web address].    Your next appointment:   1  year(s)  The format for your next appointment:   In Person  Provider:   Sherren Mocha, MD   Other Instructions You have been referred to lipid clinic at [address] office on Tuesday, May 17 @ 11:00 am.   Check bp readings for two weeks and send in to [place].  If you have chest pain please let us know.

## 2020-12-15 ENCOUNTER — Other Ambulatory Visit: Payer: Self-pay | Admitting: Cardiovascular Disease

## 2020-12-17 ENCOUNTER — Encounter: Payer: Self-pay | Admitting: Family Medicine

## 2020-12-28 DIAGNOSIS — N8111 Cystocele, midline: Secondary | ICD-10-CM | POA: Diagnosis not present

## 2020-12-31 ENCOUNTER — Encounter: Payer: Self-pay | Admitting: Family Medicine

## 2020-12-31 ENCOUNTER — Ambulatory Visit (INDEPENDENT_AMBULATORY_CARE_PROVIDER_SITE_OTHER): Payer: Medicare HMO | Admitting: Family Medicine

## 2020-12-31 ENCOUNTER — Other Ambulatory Visit: Payer: Self-pay

## 2020-12-31 VITALS — BP 132/70 | HR 70 | Temp 97.9°F | Ht 66.0 in | Wt 135.2 lb

## 2020-12-31 DIAGNOSIS — R5383 Other fatigue: Secondary | ICD-10-CM

## 2020-12-31 DIAGNOSIS — Z90711 Acquired absence of uterus with remaining cervical stump: Secondary | ICD-10-CM | POA: Diagnosis not present

## 2020-12-31 DIAGNOSIS — N3289 Other specified disorders of bladder: Secondary | ICD-10-CM

## 2020-12-31 DIAGNOSIS — R1030 Lower abdominal pain, unspecified: Secondary | ICD-10-CM | POA: Diagnosis not present

## 2020-12-31 DIAGNOSIS — Z9889 Other specified postprocedural states: Secondary | ICD-10-CM | POA: Diagnosis not present

## 2020-12-31 NOTE — Progress Notes (Signed)
[facility] [address] [place] [address] Dept: [phone] Dept Fax: [phone]  Office Visit  Subjective:    Patient ID: Megan [name], female    DOB: 02/18/1951, 70 y.o..   MRN: [id number]  Chief Complaint  Patient presents with  . Follow-up    C/o feeling very fatigue, no energy, waking up at night sweating.  Having low BP 94/61 in the middle of the night with a pulse of 64, 107/53, 104/54.  2 weeks     History of Present Illness:  Patient is in today for with a 2-3 week history of profound fatigue with lack of energy, drenching night sweats and nocturnal low blood pressures. She feels she is just not her self and is concerned about what may be wrong. She admits her appetite has been poor. Megan [name] underwent robot-assisted supracervical hysterectomy, salpingo-oophorectomy and sacrocolpopexy on 11/15/2020. She returned to the hospital on 11/21/2020 with an extraperitoneal bladder rupture and bilateral hydronephrosis, which was managed with antibiotics and a bladder catheter. She notes she recently saw her urologist back, but they did not pursue further workup for her current symptoms.  Past Medical History: Patient Active Problem List   Diagnosis Date Noted  . Bladder rupture 12/10/2020  . Urinary retention 11/21/2020  . Cystocele with prolapse 11/15/2020  . Asthma 11/05/2020  . Uterine prolapse 11/05/2020  . Episodic cluster headache, not intractable 09/14/2020  . Chronic left-sided headache 08/09/2020  . Cerebral vascular disease 08/09/2020  . Depression with anxiety 08/09/2020  . 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 Closed fracture of right distal radius 01/12/2019  . Moderate obstructive sleep apnea 05/21/2017  . Vitamin D deficiency 07/17/2016  . Abdominal aortic atherosclerosis ([place]) 07/16/2016  . Renal artery stenosis ([place]) 06/12/2016  . Chronic low back pain 08/24/2015  .  Hyperlipidemia, mixed 12/17/2014  . Hepatic cyst 05/22/2014  . Insomnia 03/10/2014  . 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 Osteoporosis 09/07/2012  . 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 Tricuspid regurgitation, Mild-Moderate 07/21/2011  . Mitral regurgitation, Trivial 07/21/2011  . Gastroesophageal reflux disease with hiatal hernia 06/17/2011  . Endometrial hyperplasia   . Diverticulosis   . IBS (irritable bowel syndrome) with constipation 04/24/2011  . Barrett's esophagus 03/04/2011  . Hiatal hernia 03/04/2011  . Basal cell carcinoma (BCC) of face 11/15/2009  . Vitamin B12 deficiency 02/15/2009  . 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 Essential hypertension 01/04/2009   Past Surgical History:  Procedure Laterality Date  . APPENDECTOMY    . endoscopic hemoclip    . eye surgery Left    lens implant  . HYSTEROSCOPY     POLYP  . OPEN REDUCTION INTERNAL FIXATION (ORIF) DISTAL RADIAL FRACTURE Left 01/17/2019   Procedure: LEFT OPEN REDUCTION INTERNAL FIXATION (ORIF) DISTAL RADIUS FRACTURE;  Surgeon: Daryll Brod, MD;  Location: [facility];  Service: Orthopedics;  Laterality: Left;  AXILLARY BLOCK  . PERIPHERAL VASCULAR CATHETERIZATION Left 07/24/2016   Procedure: Renal Angiography;  Surgeon: Conrad Gwynn, MD;  Location: [facility];  Service: Cardiovascular;  Laterality: Left;  . ROBOTIC ASSISTED BILATERAL SALPINGO OOPHERECTOMY  11/15/2020  . ROBOTIC ASSISTED LAPAROSCOPIC SACROCOLPOPEXY Bilateral 11/15/2020   Procedure: XI ROBOTIC ASSISTED LAPAROSCOPIC SACROCOLPOPEXY AND SUPRACERVICIAL HYSTERECTOMY BILATERAL SALPINGO-OOPHERECTOMY;  Surgeon: Ardis Hughs, MD;  Location: [facility];  Service: Urology;  Laterality: Bilateral;  REQUESTING 4.5 HRS  . ROBOTIC ASSISTED SUPRACERVICAL HYSTERECTOMY  11/15/2020  . UPPER GI ENDOSCOPY    . UTERINE  FIBROID SURGERY     Family History  Problem Relation Age of Onset  . Colon cancer Mother   . Diabetes Mother   . Hypertension  Mother   . Colon polyps Father   . Parkinson's disease Father   . Colon cancer Maternal Grandmother   . Breast cancer Maternal Grandmother 26  . Diabetes Paternal Grandmother   . Breast cancer Paternal Grandmother 32  . Allergies Sister   . Arthritis Brother        b/l hip replace  . Alcohol abuse Daughter   . Arthritis Son   . Hypertension Son   . Allergies Son   . Osteoporosis Son   . Osteoporosis Son   . Arthritis Son        back disease, DDD  . Allergies Son   . Arthritis Son   . Irritable bowel syndrome Son        RSD  . Hypertension Paternal Grandfather   . Heart disease Paternal Grandfather   . Aneurysm Paternal Grandfather   . Colon cancer Maternal Aunt   . Colon cancer Cousin        X3  . Ovarian cancer Cousin    Outpatient Medications Prior to Visit  Medication Sig Dispense Refill  . albuterol (VENTOLIN HFA) 108 (90 Base) MCG/ACT inhaler Inhale 2 puffs into the lungs every 6 (six) hours as needed for wheezing or shortness of breath. 8 g 0  . ALPRAZolam (XANAX) 1 MG tablet TAKE 1 TABLET BY MOUTH TWICE A DAY AS NEEDED 30 tablet 2  . azelastine (ASTELIN) 0.1 % nasal spray Place 2 sprays into both nostrils 2 (two) times daily. Use in each nostril as directed 30 mL 3  . diltiazem (CARTIA XT) 180 MG 24 hr capsule Take 1 capsule (180 mg total) by mouth daily. 90 capsule 3  . docusate sodium (COLACE) 100 MG capsule Take 1 capsule (100 mg total) by mouth 2 (two) times daily.    Marland Kitchen gabapentin (NEURONTIN) 100 MG capsule Take 100 mg by mouth as needed (for pain).    Marland Kitchen linaclotide (LINZESS) 72 MCG capsule Take 1 capsule (72 mcg total) by mouth daily before breakfast. 30 capsule 2  . metoprolol succinate (TOPROL-XL) 25 MG 24 hr tablet Take 1 tablet (25 mg total) by mouth daily. 90 tablet 3  . omeprazole (PRILOSEC) 40 MG capsule Take 40 mg by mouth in the morning and at bedtime.    . ondansetron (ZOFRAN) 4 MG tablet Take 4 mg by mouth every 8 (eight) hours as needed for nausea or  vomiting.    . phenazopyridine (PYRIDIUM) 200 MG tablet Take 200 mg by mouth 3 (three) times daily as needed for pain.    . polyethylene glycol (MIRALAX / GLYCOLAX) 17 g packet Take 17 g by mouth daily. 14 each 0  . promethazine (PHENERGAN) 25 MG tablet Take 25 mg by mouth every 6 (six) hours as needed for nausea or vomiting.    . sucralfate (CARAFATE) 1 g tablet TAKE 1 TABLET BY MOUTH FOUR TIMES A DAY AS NEEDED    . estradiol (ESTRACE VAGINAL) 0.1 MG/GM vaginal cream Place 1 Applicatorful vaginally 3 (three) times a week. Use 1 small dolyp of cream on tip of index finger and swap the inside of the vagina (Patient not taking: Reported on 12/31/2020) 42.5 g 1  . tiZANidine (ZANAFLEX) 4 MG tablet Take 1 tablet (4 mg total) by mouth at bedtime. (Patient not taking: Reported on 12/31/2020) 30 tablet 0  .  traMADol (ULTRAM) 50 MG tablet Take 1-2 tablets (50-100 mg total) by mouth every 6 (six) hours as needed for moderate pain or severe pain. 20 tablet 0   No facility-administered medications prior to visit.   Allergies  Allergen Reactions  . Sulfamethoxazole Shortness Of Breath    chest tightness  . Amitriptyline Anxiety and Other (See Comments)    Depression, insomnia  . Effexor [Venlafaxine]     Palpitations, hypertension  . Lactose Intolerance (Gi) Diarrhea and Nausea And Vomiting  . Lexapro [Escitalopram Oxalate]     nausea  . Prednisone Palpitations   Objective:   Today's Vitals   12/31/20 1410  BP: 132/70  Pulse: 70  Temp: 97.9 F (36.6 C)  TempSrc: Temporal  SpO2: 99%  Weight: 135 lb 3.2 oz (61.3 kg)  Height: 5\' 6"  (1.676 m)   Body mass index is 21.82 kg/m.   General: Well developed, well nourished. No acute distress. Abdomen: Soft. Tender over both lower quadrants with mild guarding. Bowel sounds positive, normal pitch and   frequency. No hepatosplenomegaly.  Psych: Alert and oriented. Normal mood and affect.  Health Maintenance Due  Topic Date Due  . COVID-19 Vaccine  (1) Never done  . Hepatitis C Screening  Never done     Assessment & Plan:   1. Fatigue, unspecified type Megan [name] was noted in the hospital to have some anemia. I will recheck her CBC to assess if this is resolving. I will also reassess renal, hepatic function, and electrolytes to make sure that these are not contributing. I will check a TSH to make sure there is not some hypothyroidism that has developed in response to her recent surgery and complications.  - CBC - Comprehensive metabolic panel - TSH  2. Lower abdominal pain 3. Status post laparoscopic supracervical hysterectomy 4. Status post sacrocolpopexy 5. Bladder rupture Megan [name] is having lower abdominal pain. I will assess for potential infectious causes or further issues related to her surgical complication. I will plan to see her back next week, if the work-up does not indicate a need for more urgent referral.  - CBC - Comprehensive metabolic panel - Urinalysis w microscopic + reflex cultur - Sedimentation rate - US Pelvic Complete With Transvaginal; Future  Haydee Salter, MD

## 2020-12-31 NOTE — Progress Notes (Unsigned)
Patient ID: Megan [name]                 DOB: September 29, 1950                    MRN: [id number]     HPI: Megan [name] is a 70 y.o. female patient referred to lipid clinic by Dr. Burt Knack. PMH is significant for abdominal aortic atherosclerosis, cerebral vascular disease, HTN, HLD, OSA, palpitations, GERD, depression/anxiety, and hypothyroidism.   Patient presents in good spirits. Reports currently not taking any lipid-lowering medications. Reports ***  Statins - GI side effects - followed by [facility] May not tolerate ezetimibe  Compliance? Muscle pain? Meds tried in the past and any side effects? Go over labs and goals Lipid therapy options - [employer] medicare -PCSK9i - [place]atha -Bempedoic acid Diet?? Exercise??   Insurance coverage? Need for PAP?  Order Lab F/u appt in 3 months (lipid panel and LFTs)   Current Medications: none Intolerances: atorvastatin 20 mg daily and rosuvastatin 10 mg daily (GI side effects) Risk Factors: abdominal aortic atherosclerosis, CVD, HTN, HLD,  LDL goal: <70 mg/dL  Diet:   Exercise:   Family History: Alcohol abuse in her daughter; Allergies in her sister, son, and son; Aneurysm in her paternal grandfather; Arthritis in her brother, son, son, and son; Breast cancer (age of onset: 4) in her paternal grandmother; Breast cancer (age of onset: 31) in her maternal grandmother; Colon cancer in her cousin, maternal aunt, maternal grandmother, and mother; Colon polyps in her father; Diabetes in her mother and paternal grandmother; Heart disease in her paternal grandfather; Hypertension in her mother, paternal grandfather, and son; Irritable bowel syndrome in her son; Osteoporosis in her son and son; Ovarian cancer in her cousin; Parkinson's disease in her father  Social History: Former Smoker (Quit: 08/19/1995)  Labs: 11/05/20: LDL 154, TC 235, TG 115, HDL 57 (no therapy)  Past Medical History:  Diagnosis Date  . 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  Adrenal gland cyst ([place]) 05/22/2014  . Allergic state 12/31/2012  . Anxiety   . Asthma    triggerred by fragrances   . Barrett esophagus   . Blind loop syndrome   . C. difficile diarrhea   . Cancer [facility])    pt denies   . Chest pain, atypical 07/14/2011  . Cold sore 07/17/2016  . Colon polyp   . Contact dermatitis 03/23/2012  . Cystocele, midline   . Depression   . Diarrhea   . Diverticulosis   . Endometrial hyperplasia 02/27/2006   BENIGN ENDO BX ON 02/2007  . GERD (gastroesophageal reflux disease)   . Headache 04/16/2015  . Headache   . Headache(784.0) 04/15/2012   pt denies   . Heart murmur   . Hematuria 04/27/2012  . Hepatic cyst   . Hiatal hernia   . Hiatal hernia   . Hyperlipidemia, mixed 12/17/2014  . Hypertension   . IBS (irritable bowel syndrome)   . Insomnia   . Leaky heart valve   . Low back pain 08/15/2013  . Osteoporosis 03/2017   T score -2.5. Without significant loss from prior studies  . 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 Paronychia of great toe, left 06/19/2013  . Preventative health care 09/11/2014  . Rectal bleeding 10/10/2011  . Rectocele   . 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 Sinusitis acute 02/03/2012  . Thrush 07/21/2011  . 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 Unspecified hypothyroidism   . Unspecified menopausal and postmenopausal disorder   .  Uterine prolapse without mention of vaginal wall prolapse   . Vitamin B12 deficiency   . Vitamin D deficiency 07/17/2016    Current Outpatient Medications on File Prior to Visit  Medication Sig Dispense Refill  . albuterol (VENTOLIN HFA) 108 (90 Base) MCG/ACT inhaler Inhale 2 puffs into the lungs every 6 (six) hours as needed for wheezing or shortness of breath. 8 g 0  . ALPRAZolam (XANAX) 1 MG tablet TAKE 1 TABLET BY MOUTH TWICE A DAY AS NEEDED 30 tablet 2  . azelastine (ASTELIN) 0.1 % nasal spray Place 2 sprays into both nostrils 2 (two) times daily. Use in each nostril as directed 30 mL 3  . diltiazem (CARTIA  XT) 180 MG 24 hr capsule Take 1 capsule (180 mg total) by mouth daily. 90 capsule 3  . docusate sodium (COLACE) 100 MG capsule Take 1 capsule (100 mg total) by mouth 2 (two) times daily.    Marland Kitchen gabapentin (NEURONTIN) 100 MG capsule Take 100 mg by mouth as needed (for pain).    Marland Kitchen linaclotide (LINZESS) 72 MCG capsule Take 1 capsule (72 mcg total) by mouth daily before breakfast. 30 capsule 2  . metoprolol succinate (TOPROL-XL) 25 MG 24 hr tablet Take 1 tablet (25 mg total) by mouth daily. 90 tablet 3  . omeprazole (PRILOSEC) 40 MG capsule Take 40 mg by mouth in the morning and at bedtime.    . ondansetron (ZOFRAN) 4 MG tablet Take 4 mg by mouth every 8 (eight) hours as needed for nausea or vomiting.    . phenazopyridine (PYRIDIUM) 200 MG tablet Take 200 mg by mouth 3 (three) times daily as needed for pain.    . polyethylene glycol (MIRALAX / GLYCOLAX) 17 g packet Take 17 g by mouth daily. 14 each 0  . promethazine (PHENERGAN) 25 MG tablet Take 25 mg by mouth every 6 (six) hours as needed for nausea or vomiting.    . sucralfate (CARAFATE) 1 g tablet TAKE 1 TABLET BY MOUTH FOUR TIMES A DAY AS NEEDED     No current facility-administered medications on file prior to visit.    Allergies  Allergen Reactions  . Sulfamethoxazole Shortness Of Breath    chest tightness  . Amitriptyline Anxiety and Other (See Comments)    Depression, insomnia  . Effexor [Venlafaxine]     Palpitations, hypertension  . Lactose Intolerance (Gi) Diarrhea and Nausea And Vomiting  . Lexapro [Escitalopram Oxalate]     nausea  . Prednisone Palpitations    Assessment/Plan:  1. Hyperlipidemia -   Lorel Monaco, PharmD, [place]

## 2021-01-01 ENCOUNTER — Other Ambulatory Visit: Payer: Self-pay | Admitting: Family Medicine

## 2021-01-01 ENCOUNTER — Encounter: Payer: Self-pay | Admitting: Family Medicine

## 2021-01-01 ENCOUNTER — Ambulatory Visit: Payer: Medicare HMO

## 2021-01-01 DIAGNOSIS — J4520 Mild intermittent asthma, uncomplicated: Secondary | ICD-10-CM

## 2021-01-01 LAB — CBC
HCT: 40.7 % (ref 36.0–46.0)
Hemoglobin: 12.9 g/dL (ref 12.0–15.0)
MCHC: 31.6 g/dL (ref 30.0–36.0)
MCV: 96.4 fl (ref 78.0–100.0)
Platelets: 285 10*3/uL (ref 150.0–400.0)
RBC: 4.22 Mil/uL (ref 3.87–5.11)
RDW: 14.1 % (ref 11.5–15.5)
WBC: 7.9 10*3/uL (ref 4.0–10.5)

## 2021-01-01 LAB — COMPREHENSIVE METABOLIC PANEL
ALT: 11 U/L (ref 0–35)
AST: 14 U/L (ref 0–37)
Albumin: 4.3 g/dL (ref 3.5–5.2)
Alkaline Phosphatase: 92 U/L (ref 39–117)
BUN: 7 mg/dL (ref 6–23)
CO2: 26 mEq/L (ref 19–32)
Calcium: 9.6 mg/dL (ref 8.4–10.5)
Chloride: 103 mEq/L (ref 96–112)
Creatinine, Ser: 0.6 mg/dL (ref 0.40–1.20)
GFR: 90.92 mL/min (ref >60.00)
Glucose, Bld: 100 mg/dL — ABNORMAL HIGH (ref 70–99)
Potassium: 4 mEq/L (ref 3.5–5.1)
Sodium: 138 mEq/L (ref 135–145)
Total Bilirubin: 0.3 mg/dL (ref 0.2–1.2)
Total Protein: 7 g/dL (ref 6.0–8.3)

## 2021-01-01 LAB — TSH: TSH: 0.6 u[IU]/mL (ref 0.35–4.50)

## 2021-01-01 LAB — SEDIMENTATION RATE: Sed Rate: 11 mm/hr (ref 0–30)

## 2021-01-02 LAB — URINALYSIS W MICROSCOPIC + REFLEX CULTURE
Bacteria, UA: NONE SEEN /HPF
Bilirubin Urine: NEGATIVE
Glucose, UA: NEGATIVE
Hgb urine dipstick: NEGATIVE
Hyaline Cast: NONE SEEN /LPF
Ketones, ur: NEGATIVE
Nitrites, Initial: NEGATIVE
Protein, ur: NEGATIVE
RBC / HPF: NONE SEEN /HPF (ref 0–2)
Specific Gravity, Urine: 1.005 (ref 1.001–1.035)
pH: 6.5 (ref 5.0–8.0)

## 2021-01-02 LAB — URINE CULTURE
MICRO NUMBER:: 11896600
SPECIMEN QUALITY:: ADEQUATE

## 2021-01-02 LAB — CULTURE INDICATED

## 2021-01-03 ENCOUNTER — Other Ambulatory Visit: Payer: Self-pay | Admitting: Family Medicine

## 2021-01-03 DIAGNOSIS — N3090 Cystitis, unspecified without hematuria: Secondary | ICD-10-CM

## 2021-01-03 MED ORDER — CIPROFLOXACIN HCL 250 MG PO TABS: 250.0000 mg | ORAL_TABLET | Freq: Two times a day (BID) | ORAL | 0 refills | Status: AC

## 2021-01-03 NOTE — Progress Notes (Signed)
Patient with [phone] growth of Klebsiella. She is having urinary frequency. She had recent bladder rupture and indwellign foley catheter use. Recommend treatment. Patient has sulfa allergy. Will treat with Cipro based on sensitivity pattern.

## 2021-01-07 ENCOUNTER — Ambulatory Visit: Payer: Medicare HMO | Admitting: Family Medicine

## 2021-01-10 ENCOUNTER — Other Ambulatory Visit: Payer: Self-pay | Admitting: Family Medicine

## 2021-01-10 ENCOUNTER — Ambulatory Visit
Admission: RE | Admit: 2021-01-10 | Discharge: 2021-01-10 | Disposition: A | Discharge status: Home / Self Care | Payer: Medicare HMO | Source: Ambulatory Visit | Attending: Family Medicine | Admitting: Family Medicine

## 2021-01-10 ENCOUNTER — Telehealth: Payer: Self-pay

## 2021-01-10 DIAGNOSIS — R1030 Lower abdominal pain, unspecified: Secondary | ICD-10-CM | POA: Diagnosis not present

## 2021-01-10 DIAGNOSIS — Z90710 Acquired absence of both cervix and uterus: Secondary | ICD-10-CM | POA: Diagnosis not present

## 2021-01-10 NOTE — Telephone Encounter (Signed)
Spoke to [place] tech @ [facility] regarding the order for transvaginal US, they did a Pelvic US only and canceled the Transvaginal one. Spoke to Provider on cell phone, and was advised that he originally ordered an Abdominal and Pelvic US to which was advised to change it.  Tried to call [facility] back and was told that they would get the message to her.   I called the patient @ 3:20 pm and she had already left the facility.  I did apologize for the inconvience.   Advised her that I would reach out to provider and have him order the Abdominal US as a STAT so we can get her scheduled ASAP.   Please advise   Thanks. Dm/cma

## 2021-01-16 DIAGNOSIS — R519 Headache, unspecified: Secondary | ICD-10-CM | POA: Diagnosis not present

## 2021-01-16 DIAGNOSIS — H35372 Puckering of macula, left eye: Secondary | ICD-10-CM | POA: Diagnosis not present

## 2021-01-16 DIAGNOSIS — H40023 Open angle with borderline findings, high risk, bilateral: Secondary | ICD-10-CM | POA: Diagnosis not present

## 2021-01-16 DIAGNOSIS — H0102B Squamous blepharitis left eye, upper and lower eyelids: Secondary | ICD-10-CM | POA: Diagnosis not present

## 2021-01-16 DIAGNOSIS — H0102A Squamous blepharitis right eye, upper and lower eyelids: Secondary | ICD-10-CM | POA: Diagnosis not present

## 2021-01-16 DIAGNOSIS — Z961 Presence of intraocular lens: Secondary | ICD-10-CM | POA: Diagnosis not present

## 2021-01-16 DIAGNOSIS — H40033 Anatomical narrow angle, bilateral: Secondary | ICD-10-CM | POA: Diagnosis not present

## 2021-01-16 DIAGNOSIS — H04123 Dry eye syndrome of bilateral lacrimal glands: Secondary | ICD-10-CM | POA: Diagnosis not present

## 2021-01-19 DIAGNOSIS — I4519 Other right bundle-branch block: Secondary | ICD-10-CM | POA: Diagnosis not present

## 2021-01-19 DIAGNOSIS — N3289 Other specified disorders of bladder: Secondary | ICD-10-CM | POA: Diagnosis not present

## 2021-01-19 DIAGNOSIS — R188 Other ascites: Secondary | ICD-10-CM | POA: Diagnosis not present

## 2021-01-19 DIAGNOSIS — E279 Disorder of adrenal gland, unspecified: Secondary | ICD-10-CM | POA: Diagnosis not present

## 2021-01-19 DIAGNOSIS — R5383 Other fatigue: Secondary | ICD-10-CM | POA: Diagnosis not present

## 2021-01-19 DIAGNOSIS — R531 Weakness: Secondary | ICD-10-CM | POA: Diagnosis not present

## 2021-01-19 DIAGNOSIS — Z90710 Acquired absence of both cervix and uterus: Secondary | ICD-10-CM | POA: Diagnosis not present

## 2021-01-19 DIAGNOSIS — K921 Melena: Secondary | ICD-10-CM | POA: Diagnosis not present

## 2021-01-19 DIAGNOSIS — A09 Infectious gastroenteritis and colitis, unspecified: Secondary | ICD-10-CM | POA: Diagnosis not present

## 2021-01-21 DIAGNOSIS — I4510 Unspecified right bundle-branch block: Secondary | ICD-10-CM | POA: Diagnosis not present

## 2021-01-22 ENCOUNTER — Ambulatory Visit: Payer: Medicare HMO

## 2021-01-22 DIAGNOSIS — K589 Irritable bowel syndrome without diarrhea: Secondary | ICD-10-CM | POA: Diagnosis not present

## 2021-01-22 DIAGNOSIS — C7A010 Malignant carcinoid tumor of the duodenum: Secondary | ICD-10-CM | POA: Diagnosis not present

## 2021-01-22 DIAGNOSIS — K2270 Barrett's esophagus without dysplasia: Secondary | ICD-10-CM | POA: Diagnosis not present

## 2021-01-22 DIAGNOSIS — K581 Irritable bowel syndrome with constipation: Secondary | ICD-10-CM | POA: Diagnosis not present

## 2021-01-22 DIAGNOSIS — K921 Melena: Secondary | ICD-10-CM | POA: Diagnosis not present

## 2021-01-22 DIAGNOSIS — M6289 Other specified disorders of muscle: Secondary | ICD-10-CM | POA: Diagnosis not present

## 2021-01-23 DIAGNOSIS — K921 Melena: Secondary | ICD-10-CM | POA: Diagnosis not present

## 2021-01-25 ENCOUNTER — Other Ambulatory Visit: Payer: Self-pay | Admitting: Family Medicine

## 2021-01-25 DIAGNOSIS — J4520 Mild intermittent asthma, uncomplicated: Secondary | ICD-10-CM

## 2021-02-05 ENCOUNTER — Other Ambulatory Visit: Payer: Self-pay

## 2021-02-05 ENCOUNTER — Ambulatory Visit (INDEPENDENT_AMBULATORY_CARE_PROVIDER_SITE_OTHER): Payer: Medicare HMO | Admitting: Family Medicine

## 2021-02-05 ENCOUNTER — Encounter: Payer: Self-pay | Admitting: Family Medicine

## 2021-02-05 VITALS — BP 138/70 | HR 72 | Temp 96.9°F | Ht 66.0 in | Wt 135.0 lb

## 2021-02-05 DIAGNOSIS — K581 Irritable bowel syndrome with constipation: Secondary | ICD-10-CM

## 2021-02-05 DIAGNOSIS — K219 Gastro-esophageal reflux disease without esophagitis: Secondary | ICD-10-CM | POA: Diagnosis not present

## 2021-02-05 DIAGNOSIS — K449 Diaphragmatic hernia without obstruction or gangrene: Secondary | ICD-10-CM

## 2021-02-05 DIAGNOSIS — F99 Mental disorder, not otherwise specified: Secondary | ICD-10-CM

## 2021-02-05 DIAGNOSIS — F5105 Insomnia due to other mental disorder: Secondary | ICD-10-CM | POA: Diagnosis not present

## 2021-02-05 DIAGNOSIS — I10 Essential (primary) hypertension: Secondary | ICD-10-CM

## 2021-02-05 DIAGNOSIS — J4520 Mild intermittent asthma, uncomplicated: Secondary | ICD-10-CM

## 2021-02-05 DIAGNOSIS — G44019 Episodic cluster headache, not intractable: Secondary | ICD-10-CM

## 2021-02-05 DIAGNOSIS — K529 Noninfective gastroenteritis and colitis, unspecified: Secondary | ICD-10-CM

## 2021-02-05 DIAGNOSIS — E278 Other specified disorders of adrenal gland: Secondary | ICD-10-CM | POA: Insufficient documentation

## 2021-02-05 MED ORDER — ONDANSETRON HCL 4 MG PO TABS: 4.0000 mg | ORAL_TABLET | Freq: Three times a day (TID) | ORAL | 6 refills | Status: DC | PRN

## 2021-02-05 MED ORDER — METOPROLOL SUCCINATE ER 25 MG PO TB24: 25.0000 mg | ORAL_TABLET | Freq: Every day | ORAL | 3 refills | Status: DC

## 2021-02-05 NOTE — Patient Instructions (Signed)
Insomnia Insomnia is a sleep disorder that makes it difficult to fall asleep or stay asleep. Insomnia can cause fatigue, low energy, difficulty concentrating, moodswings, and poor performance at work or school. There are three different ways to classify insomnia: Difficulty falling asleep. Difficulty staying asleep. Waking up too early in the morning. Any type of insomnia can be long-term (chronic) or short-term (acute). Both are common. Short-term insomnia usually lasts for three months or less. Chronic insomnia occurs at least three times a week for longer than threemonths. What are the causes? Insomnia may be caused by another condition, situation, or substance, such as: Anxiety. Certain medicines. Gastroesophageal reflux disease (GERD) or other gastrointestinal conditions. Asthma or other breathing conditions. Restless legs syndrome, sleep apnea, or other sleep disorders. Chronic pain. Menopause. Stroke. Abuse of alcohol, tobacco, or illegal drugs. Mental health conditions, such as depression. Caffeine. Neurological disorders, such as Alzheimer's disease. An overactive thyroid (hyperthyroidism). Sometimes, the cause of insomnia may not be known. What increases the risk? Risk factors for insomnia include: Gender. Women are affected more often than men. Age. Insomnia is more common as you get older. Stress. Lack of exercise. Irregular work schedule or working night shifts. Traveling between different time zones. Certain medical and mental health conditions. What are the signs or symptoms? If you have insomnia, the main symptom is having trouble falling asleep or having trouble staying asleep. This may lead to other symptoms, such as: Feeling fatigued or having low energy. Feeling nervous about going to sleep. Not feeling rested in the morning. Having trouble concentrating. Feeling irritable, anxious, or depressed. How is this diagnosed? This condition may be diagnosed based  on: Your symptoms and medical history. Your health care provider may ask about: Your sleep habits. Any medical conditions you have. Your mental health. A physical exam. How is this treated? Treatment for insomnia depends on the cause. Treatment may focus on treating an underlying condition that is causing insomnia. Treatment may also include: Medicines to help you sleep. Counseling or therapy. Lifestyle adjustments to help you sleep better. Follow these instructions at home: Eating and drinking  Limit or avoid alcohol, caffeinated beverages, and cigarettes, especially close to bedtime. These can disrupt your sleep. Do not eat a large meal or eat spicy foods right before bedtime. This can lead to digestive discomfort that can make it hard for you to sleep.  Sleep habits  Keep a sleep diary to help you and your health care provider figure out what could be causing your insomnia. Write down: When you sleep. When you wake up during the night. How well you sleep. How rested you feel the next day. Any side effects of medicines you are taking. What you eat and drink. Make your bedroom a dark, comfortable place where it is easy to fall asleep. Put up shades or blackout curtains to block light from outside. Use a white noise machine to block noise. Keep the temperature cool. Limit screen use before bedtime. This includes: Watching TV. Using your smartphone, tablet, or computer. Stick to a routine that includes going to bed and waking up at the same times every day and night. This can help you fall asleep faster. Consider making a quiet activity, such as reading, part of your nighttime routine. Try to avoid taking naps during the day so that you sleep better at night. Get out of bed if you are still awake after 15 minutes of trying to sleep. Keep the lights down, but try reading or doing a quiet   activity. When you feel sleepy, go back to bed.  General instructions Take over-the-counter  and prescription medicines only as told by your health care provider. Exercise regularly, as told by your health care provider. Avoid exercise starting several hours before bedtime. Use relaxation techniques to manage stress. Ask your health care provider to suggest some techniques that may work well for you. These may include: Breathing exercises. Routines to release muscle tension. Visualizing peaceful scenes. Make sure that you drive carefully. Avoid driving if you feel very sleepy. Keep all follow-up visits as told by your health care provider. This is important. Contact a health care provider if: You are tired throughout the day. You have trouble in your daily routine due to sleepiness. You continue to have sleep problems, or your sleep problems get worse. Get help right away if: You have serious thoughts about hurting yourself or someone else. If you ever feel like you may hurt yourself or others, or have thoughts about taking your own life, get help right away. You can go to your nearest emergency department or call: Your local emergency services (911 in the [place] A suicide crisis helpline, such as the National Suicide Prevention Lifeline at 1-800-273-8255. This is open 24 hours a day. Summary Insomnia is a sleep disorder that makes it difficult to fall asleep or stay asleep. Insomnia can be long-term (chronic) or short-term (acute). Treatment for insomnia depends on the cause. Treatment may focus on treating an underlying condition that is causing insomnia. Keep a sleep diary to help you and your health care provider figure out what could be causing your insomnia. This information is not intended to replace advice given to you by your health care provider. Make sure you discuss any questions you have with your healthcare provider. Document Revised: 06/14/2020 Document Reviewed: 06/14/2020 Elsevier Patient Education  2022 [employer] Inc.  

## 2021-02-05 NOTE — Progress Notes (Signed)
[facility] [address] [place] [address] Dept: [phone] Dept Fax: [phone]  Chronic Care Office Visit  Subjective:    Patient ID: Megan [name], female    DOB: 21-Dec-1950, 70 y.o..   MRN: [id number]  Chief Complaint  Patient presents with   Follow-up    3 month f/u. She is fasting this am.      History of Present Illness:  Patient is in today for reassessment of chronic medical issues.  Since her last visit, Megan [name] was seen in the ED at [facility] with abdominal pain and hematochezia. She was found to have CT evidence of colitis and was treated with Flagyl and Rocephin. She was followed up by [name], PA-C. Stools cultures and C.difficle were obtained, which have all returned negative. Megan [name] notes that at this point, her bleeding has cleared up. She continues to complain of vague, generalized abdominal discomfort. Megan [name] had her resume using Linzess and had her stop using stool softeners. She also continues to use Miralx, but notes some ongoing constipation. Megan [name] feels her GERD is adequately controlled on omeprazole with PRN sucralfate. She has been using some aloe vera as well, and initially had positive response, but now not so much.  Megan [name] continues to have chronic headaches. She feels her gabapentin is not as effective for these currently. She does have an appointment on Thursday with her neurologist.   Megan [name] notes a chronic issue with insomnia. She goes to be around 10:30 at night. She notes she lies awake for often for several hours, at which point, she will take a 1/2 tab of Xanax (0.5 mg). She then awakens around 3-4 am and lies awake for hours unable to return to sleep. She notes that while lying awake, she often has racing thoughts and worries. She does drink tea with dinner many nights.  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   Closed fracture of right distal radius 01/12/2019   Moderate obstructive sleep apnea 05/21/2017   Vitamin D deficiency 07/17/2016   Abdominal aortic atherosclerosis ([place]) 07/16/2016   Renal artery stenosis ([place]) 06/12/2016   Chronic low back pain 08/24/2015   Hyperlipidemia, mixed 12/17/2014   Hepatic cyst 05/22/2014   Insomnia due to other mental disorder 03/10/2014   Pancreatic cyst 04/19/2013   Osteoporosis 09/07/2012   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   Tricuspid regurgitation, Mild-Moderate 07/21/2011   Mitral regurgitation, Trivial 07/21/2011   Gastroesophageal reflux disease with hiatal hernia 06/17/2011   Endometrial hyperplasia    Diverticulosis    IBS (irritable bowel syndrome) with constipation 04/24/2011   Barrett's esophagus 03/04/2011   Hiatal hernia 03/04/2011   Basal cell carcinoma (BCC) of face 11/15/2009   Vitamin B12 deficiency 02/15/2009   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   Essential hypertension 01/04/2009   Past Surgical History:  Procedure Laterality Date   APPENDECTOMY     endoscopic hemoclip     eye surgery Left    lens implant   HYSTEROSCOPY     POLYP   OPEN REDUCTION INTERNAL FIXATION (ORIF) DISTAL RADIAL  FRACTURE Left 01/17/2019   Procedure: LEFT OPEN REDUCTION INTERNAL FIXATION (ORIF) DISTAL RADIUS FRACTURE;  Surgeon: Daryll Brod, MD;  Location: [facility];  Service: Orthopedics;  Laterality: Left;  AXILLARY BLOCK   PERIPHERAL VASCULAR CATHETERIZATION Left 07/24/2016   Procedure: Renal Angiography;  Surgeon: Conrad [place], MD;  Location: [facility];  Service: Cardiovascular;  Laterality: Left;   ROBOTIC ASSISTED BILATERAL SALPINGO OOPHERECTOMY  11/15/2020   ROBOTIC ASSISTED LAPAROSCOPIC SACROCOLPOPEXY Bilateral 11/15/2020   Procedure: XI ROBOTIC ASSISTED LAPAROSCOPIC SACROCOLPOPEXY AND SUPRACERVICIAL HYSTERECTOMY BILATERAL SALPINGO-OOPHERECTOMY;  Surgeon: Ardis Hughs, MD;  Location: [facility];  Service: Urology;  Laterality: Bilateral;  REQUESTING 4.5 HRS   ROBOTIC ASSISTED SUPRACERVICAL HYSTERECTOMY  11/15/2020   UPPER GI ENDOSCOPY     UTERINE FIBROID SURGERY     Family History  Problem Relation Age of Onset   Colon cancer Mother    Diabetes Mother    Hypertension Mother    Colon polyps Father    Parkinson's disease Father    Colon cancer Maternal Grandmother    Breast cancer Maternal Grandmother 85   Diabetes Paternal Grandmother    Breast cancer Paternal Grandmother 6   Allergies Sister    Arthritis Brother        b/l hip replace   Alcohol abuse Daughter    Arthritis Son    Hypertension Son    Allergies Son    Osteoporosis Son    Osteoporosis Son    Arthritis Son        back disease, DDD   Allergies Son    Arthritis Son    Irritable bowel syndrome Son        RSD   Hypertension Paternal Grandfather    Heart disease Paternal Grandfather    Aneurysm Paternal Grandfather    Colon cancer Maternal Aunt    Colon cancer Cousin        X3   Ovarian cancer Cousin    Outpatient Medications Prior to Visit  Medication Sig Dispense Refill   albuterol (VENTOLIN HFA) 108 (90 Base) MCG/ACT inhaler TAKE 2 PUFFS BY MOUTH EVERY 6 HOURS AS NEEDED FOR WHEEZE OR SHORTNESS OF BREATH 8.5 each 0   ALPRAZolam (XANAX) 1 MG tablet TAKE 1 TABLET BY MOUTH TWICE A DAY AS NEEDED 30 tablet 2   azelastine (ASTELIN) 0.1 % nasal spray Place 2 sprays into both nostrils 2 (two) times daily. Use in each nostril as directed 30 mL 3   diltiazem (CARTIA XT) 180 MG 24 hr capsule Take 1 capsule (180 mg total) by mouth daily. 90 capsule  3   gabapentin (NEURONTIN) 100 MG capsule Take 100 mg by mouth as needed (for pain).     linaclotide (LINZESS) 72 MCG capsule Take 1 capsule (72 mcg total) by mouth daily before breakfast. 30 capsule 2   metoprolol succinate (TOPROL-XL) 25 MG 24 hr tablet Take 1 tablet (25 mg total) by mouth daily. 90 tablet 3   omeprazole (PRILOSEC) 40 MG capsule Take 40 mg by mouth in the morning and at bedtime.     ondansetron (ZOFRAN) 4 MG tablet Take 4 mg by mouth every 8 (eight) hours as needed for nausea or vomiting.     polyethylene glycol (MIRALAX / GLYCOLAX) 17 g packet Take 17 g by mouth daily. 14 each 0   promethazine (PHENERGAN) 25 MG tablet Take 25 mg by mouth every 6 (six) hours as needed for nausea or vomiting.     sucralfate (CARAFATE) 1  g tablet TAKE 1 TABLET BY MOUTH FOUR TIMES A DAY AS NEEDED     dicyclomine (BENTYL) 10 MG capsule      docusate sodium (COLACE) 100 MG capsule Take 1 capsule (100 mg total) by mouth 2 (two) times daily. (Patient not taking: Reported on 02/05/2021)     phenazopyridine (PYRIDIUM) 200 MG tablet Take 200 mg by mouth 3 (three) times daily as needed for pain. (Patient not taking: Reported on 02/05/2021)     No facility-administered medications prior to visit.   Allergies  Allergen Reactions   Sulfamethoxazole Shortness Of Breath    chest tightness   Amitriptyline Anxiety and Other (See Comments)    Depression, insomnia   Effexor [Venlafaxine]     Palpitations, hypertension   Lactose Intolerance (Gi) Diarrhea and Nausea And Vomiting   Lexapro [Escitalopram Oxalate]     nausea   Prednisone Palpitations   Objective:   Today's Vitals   02/05/21 0939  BP: 138/70  Pulse: 72  Temp: (!) 96.9 F (36.1 C)  TempSrc: Temporal  SpO2: 98%  Weight: 135 lb (61.2 kg)  Height: 5\' 6"  (1.676 m)   Body mass index is 21.79 kg/m.   General: Well developed, well nourished. No acute distress. Psych: Alert and oriented x3. Normal mood and affect.  Health Maintenance Due   Topic Date Due   COVID-19 Vaccine (1) Never done   Hepatitis C Screening  Never done   Zoster Vaccines- Shingrix (1 of 2) Never done   PNA vac Low Risk Adult (1 of 2 - PCV13) Never done   Imaging: Pelvic ultrasound (01/11/2020): IMPRESSION: 1. Prior hysterectomy with nonvisualization of the uterus and ovaries. 2. Negative transabdominal pelvic ultrasound with no pelvic or adnexal mass or abnormal free fluid. No findings to explain patient's symptoms identified.  CT of abdomen and pelvis w/o contrast (01/19/21): IMPRESSION:  1. Findings compatible with nonspecific colitis, which may be infectious, inflammatory, or ischemic in nature involving the left descending colon. 2. Small-volume low-density ascites in the pelvis. 3. Distended bladder. 4. Stable indeterminant left adrenal nodule over many examinations. Consider adrenal protocol CT if clinically indicated.     Assessment & Plan:   1. Colitis Reviewed recent ED notes, lab results, CT results, and GI consult note. Ms. Rynders was treated with antibiotics for her colitis. Her bleeding has since resolved. I wonder if this might have been diverticular bleeding and infection. As she is now improved, we will monitor this for now.  2. Essential hypertension BP is adequately controled. Continue metoprolol and diltiazem.  - metoprolol succinate (TOPROL-XL) 25 MG 24 hr tablet; Take 1 tablet (25 mg total) by mouth daily.  Dispense: 90 tablet; Refill: 3  3. Gastroesophageal reflux disease with hiatal hernia Stable on Prilosec with PRN sucralfate.  4. Mild intermittent asthma without complication Stable. Continue to use intermittent albuterol.  5. Irritable bowel syndrome with constipation Ms. Mccannon will continue to follow with GI. She is currently on Linzess and Miralax. Will renew Zofran for intermittent nausea.  - ondansetron (ZOFRAN) 4 MG tablet; Take 1 tablet (4 mg total) by mouth every 8 (eight) hours as needed for nausea or  vomiting.  Dispense: 20 tablet; Refill: 6  6. Episodic cluster headache, not intractable Following up with neurology this week. Continue gabapentin for now.  7. Insomnia due to other mental disorder I suspect her insomnia is related to her chronic anxiety and depression. She has not tolerated SSRIs or SNRIS for her depression. She is being treated  with Xanax as the only current alternative for her. I advised against adding other sleep aids at this point. I recommended she avoid caffeine in the evenings. I also recommended she seek counseling, as this would likely improve her mood disorder and could improve her sleep.    Haydee Salter, MD

## 2021-02-06 NOTE — Progress Notes (Signed)
Chief Complaint  Patient presents with   Follow-up    New rm, alone. Here for HA f/u, states HA have worsen and hears a "clicking" on the left side of head. Beleieved it was coming from her ears, had her ears checked and everyhting looked fine. Pn is now radiating down to neck, has noticed some swelling on her left side of neck. Pt states she has trouble hearing from her left ear.      HISTORICAL Megan [name] is a 70 years old female,  seen in request by her primary care physician PA Brunetta Jeans, [place] and ophthalmologist Dr. Warden Fillers for evaluation of headaches, initial evaluation was on April 07, 2018.   She has past medical history of depression anxiety, had intolerance to multiple different agents in the past, including amitriptyline, Effexor, but she denied previous history of headaches, early 2019, she began to have frequent left retro-orbital area pressure headaches, initially thought it was due to sinus issues, was evaluated by ENT Dr. Erik Obey, reported normal CT sinus in 2019.   She had left cataract surgery by Dr. Katy Fitch in April 2019, left vision overall recovered very well, but since her cataract surgery in April, left-sided headache has become worse, constant pressure, 5 out of 10 on a daily basis, often exacerbated to a more severe pounding headache with associated light noise sensitivity, nauseous, she tends to lying in bed for hours to help her headaches, she has tried over-the-counter ibuprofen and Tylenol with limited help,   We personally reviewed MRI of the brain in May 2017 for evaluation of acute onset right facial droop and numbness, there was no acute abnormality mild supratentorium small vessel disease,    ESR C-reactive protein were normal in April 2019.  UPDATE Aug 09 2020: She lost follow-up since 2019, for left-sided headache quit for about 2 years, began to have frequent headaches again few months ago, early December 2021, she had 3 days of  headache on the left parietal region, on December 7, when she woke up, her headache has much improved, but she felt weak, tired, confused, she brushed her teeth at her kitchen sink, eventually presented to the emergency room,  Personally reviewed CT head without contrast, lacunar infarction at the left head of caudate, which was also present at MRI of the brain in 2019, also supratentorium small vessel disease, she has been taking aspirin 81 mg daily  She complains stress at home, difficulty sleeping, stress, tearful,  Laboratory evaluations in December 2021, normal CMP, CBC, hemoglobin of 14.3.  Update February 07, 2021 SS: Last seen Dec 2021, started Effexor-XR 37.5 mg, gabapentin 100 mg TID for migraine prevention, Fioricet PRN. CRP, sed rate, ANA were normal. Claims couldn't take Effexor, tried for 3 weeks, felt nauseated, palpations. Taking gabapentin 200 mg 2-3 times a day, does help. Reports daily ache to left parietal area, on good days can be dull, bad days is throbbing. When turns head to right sounds to her like click to left parietal area, not audible to anyone else. With headaches has nausea. At times is overwhelming to her, feels like bad earache but in her head. Has IBS, colitis. Remains on aspirin 81 mg daily. Ice and heat help the parietal area. Claims doesn't do well with antidepressants. Admits to anxiety, is the go getter in her family. Didn't take Fioricet.   REVIEW OF SYSTEMS: Full 14 system review of systems   See HPI  ALLERGIES: Allergies  Allergen Reactions   Sulfamethoxazole  Shortness Of Breath    chest tightness   Amitriptyline Anxiety and Other (See Comments)    Depression, insomnia   Effexor [Venlafaxine]     Palpitations, hypertension   Lactose Intolerance (Gi) Diarrhea and Nausea And Vomiting   Lexapro [Escitalopram Oxalate]     nausea   Prednisone Palpitations    HOME MEDICATIONS: Current Outpatient Medications  Medication Sig Dispense Refill   albuterol  (VENTOLIN HFA) 108 (90 Base) MCG/ACT inhaler TAKE 2 PUFFS BY MOUTH EVERY 6 HOURS AS NEEDED FOR WHEEZE OR SHORTNESS OF BREATH 8.5 each 0   ALPRAZolam (XANAX) 1 MG tablet TAKE 1 TABLET BY MOUTH TWICE A DAY AS NEEDED 30 tablet 2   butalbital-acetaminophen-caffeine (FIORICET) 50-325-40 MG tablet Take 1 tablet by mouth every 6 (six) hours as needed for headache. 10 tablet 3   dicyclomine (BENTYL) 10 MG capsule      gabapentin (NEURONTIN) 300 MG capsule Take 1 capsule (300 mg total) by mouth 3 (three) times daily. 90 capsule 5   linaclotide (LINZESS) 72 MCG capsule Take 1 capsule (72 mcg total) by mouth daily before breakfast. 30 capsule 2   metoprolol succinate (TOPROL-XL) 25 MG 24 hr tablet Take 1 tablet (25 mg total) by mouth daily. 90 tablet 3   omeprazole (PRILOSEC) 40 MG capsule Take 40 mg by mouth in the morning and at bedtime.     ondansetron (ZOFRAN) 4 MG tablet Take 1 tablet (4 mg total) by mouth every 8 (eight) hours as needed for nausea or vomiting. 20 tablet 6   polyethylene glycol (MIRALAX / GLYCOLAX) 17 g packet Take 17 g by mouth daily. 14 each 0   promethazine (PHENERGAN) 25 MG tablet Take 25 mg by mouth every 6 (six) hours as needed for nausea or vomiting.     sucralfate (CARAFATE) 1 g tablet TAKE 1 TABLET BY MOUTH FOUR TIMES A DAY AS NEEDED     diltiazem (CARTIA XT) 180 MG 24 hr capsule Take 1 capsule (180 mg total) by mouth daily. 90 capsule 3   No current facility-administered medications for this visit.    PAST MEDICAL HISTORY: Past Medical History:  Diagnosis Date   Adrenal adenoma 05/22/2014   Adrenal gland cyst ([place]) 05/22/2014   Allergic state 12/31/2012   Anxiety    Asthma    triggerred by fragrances    Barrett esophagus    Blind loop syndrome    C. difficile diarrhea    Cancer ([place])    pt denies    Chest pain, atypical 07/14/2011   Cold sore 07/17/2016   Colon polyp    Contact dermatitis 03/23/2012   Cystocele, midline    Depression    Diarrhea    Diverticulosis     Endometrial hyperplasia 02/27/2006   BENIGN ENDO BX ON 02/2007   GERD (gastroesophageal reflux disease)    Headache 04/16/2015   Headache    Headache(784.0) 04/15/2012   pt denies    Heart murmur    Hematuria 04/27/2012   Hepatic cyst    Hiatal hernia    Hiatal hernia    Hyperlipidemia, mixed 12/17/2014   Hypertension    IBS (irritable bowel syndrome)    Insomnia    Leaky heart valve    Low back pain 08/15/2013   Osteoporosis 03/2017   T score -2.5. 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   Sinusitis acute 02/03/2012   Thrush 07/21/2011   Tricuspid regurgitation 07/21/2011   Unspecified hypothyroidism    Unspecified menopausal and postmenopausal disorder    Uterine prolapse without mention of vaginal wall prolapse    Vitamin B12 deficiency    Vitamin D deficiency 07/17/2016    PAST SURGICAL HISTORY: Past Surgical History:  Procedure Laterality Date   APPENDECTOMY     endoscopic hemoclip     eye surgery Left    lens implant   HYSTEROSCOPY     POLYP   OPEN REDUCTION INTERNAL FIXATION (ORIF) DISTAL RADIAL FRACTURE Left 01/17/2019   Procedure: LEFT OPEN REDUCTION INTERNAL FIXATION (ORIF) DISTAL RADIUS FRACTURE;  Surgeon: Daryll Brod, MD;  Location: [facility];  Service: Orthopedics;  Laterality: Left;  AXILLARY BLOCK   PERIPHERAL VASCULAR CATHETERIZATION Left 07/24/2016   Procedure: Renal Angiography;  Surgeon: Conrad Letona, MD;  Location: [facility];  Service: Cardiovascular;  Laterality: Left;   ROBOTIC ASSISTED BILATERAL SALPINGO OOPHERECTOMY  11/15/2020   ROBOTIC ASSISTED LAPAROSCOPIC SACROCOLPOPEXY Bilateral 11/15/2020   Procedure: XI ROBOTIC ASSISTED LAPAROSCOPIC SACROCOLPOPEXY AND SUPRACERVICIAL HYSTERECTOMY BILATERAL SALPINGO-OOPHERECTOMY;  Surgeon: Ardis Hughs, MD;  Location: [facility];  Service: Urology;  Laterality: Bilateral;  REQUESTING 4.5 HRS   ROBOTIC ASSISTED SUPRACERVICAL HYSTERECTOMY  11/15/2020   UPPER GI ENDOSCOPY     UTERINE FIBROID SURGERY      FAMILY HISTORY: Family History  Problem Relation Age of Onset   Colon cancer Mother    Diabetes Mother    Hypertension Mother    Colon polyps Father    Parkinson's disease Father    Colon cancer Maternal Grandmother    Breast cancer Maternal Grandmother 62   Diabetes Paternal Grandmother    Breast cancer Paternal Grandmother 88   Allergies Sister    Arthritis Brother        b/l hip replace   Alcohol abuse Daughter    Arthritis Son    Hypertension Son    Allergies Son    Osteoporosis Son    Osteoporosis Son    Arthritis Son        back disease, DDD   Allergies Son    Arthritis Son    Irritable bowel syndrome Son        RSD   Hypertension Paternal Grandfather    Heart disease Paternal Grandfather    Aneurysm Paternal Grandfather    Colon cancer Maternal Aunt    Colon cancer Cousin        X3   Ovarian cancer Cousin     SOCIAL HISTORY: Social History   Socioeconomic History   Marital status: Married    Spouse name: Not on file   Number of children: 4   Years of education: 12   Highest education level: High school graduate  Occupational History   Occupation: Retired  Tobacco Use   Smoking status: Former    Pack years: 0.00    Types: Cigarettes    Quit date: 08/19/1995    Years since quitting: 25.4   Smokeless tobacco: Never  Vaping Use   Vaping Use: Never used  Substance and Sexual Activity   Alcohol use: Never    Alcohol/week: 0.0 standard drinks   Drug use: Never   Sexual activity: Not Currently    Birth control/protection: Post-menopausal    Comment: 1st intercourse 45 yo-5 partners  Other Topics Concern   Not on file  Social History  Narrative   Lives at home with her husband.   Right-handed.   2 cups caffeine per day.   Social  Determinants of Health   Financial Resource Strain: Not on file  Food Insecurity: Not on file  Transportation Needs: Not on file  Physical Activity: Not on file  Stress: Not on file  Social Connections: Not on file  Intimate Partner Violence: Not on file   PHYSICAL EXAM   Vitals:   02/07/21 0758  BP: (!) 155/81  Pulse: 67  Weight: 135 lb (61.2 kg)  Height: _0  (1.676 m)    Not recorded     Body mass index is 21.79 kg/m.  Physical Exam  General: The patient is alert and cooperative at the time of the examination.  Skin: No significant peripheral edema is noted.  Neurologic Exam  Mental status: The patient is alert and oriented x 3 at the time of the examination. The patient has apparent normal recent and remote memory, with an apparently normal attention span and concentration ability.  Cranial nerves: Facial symmetry is present. Speech is normal, no aphasia or dysarthria is noted. Extraocular movements are full. Visual fields are full.  Motor: The patient has good strength in all 4 extremities.  Sensory examination: Soft touch sensation is symmetric on the face, arms, and legs.  Coordination: The patient has good finger-nose-finger and heel-to-shin bilaterally.  Gait and station: The patient has a normal gait.   Reflexes: Deep tendon reflexes are symmetric.  DIAGNOSTIC DATA (LABS, IMAGING, TESTING) - I reviewed patient records, labs, notes, testing and imaging myself where available.  ASSESSMENT AND PLAN  Megan [name] is a 70 y.o. female   Left-sided headaches 2.   Lacunar infarction 3.   Chronic depression  -Increase gabapentin 300 mg 3 times daily -Try Fioricet for acute headache when severe, otherwise, can use ice/heat -Could not tolerate Effexor, asked to call back in 1 month with progress report, may consider addition of Cymbalta, as there does seem to be an anxiety component, or even a trial of Topamax, left-sided parietal headache does  result in nausea which could be migrainous feature -CRP, sed rate, ANA were normal Dec 2021 -Continue aspirin 81 mg daily, history of lacunar infarction -Follow-up in 6 months or sooner if needed  Butler Denmark, Laqueta Jean, DNP  [facility] [address] [place], [place] [address] [phone]

## 2021-02-07 ENCOUNTER — Encounter: Payer: Self-pay | Admitting: Neurology

## 2021-02-07 ENCOUNTER — Ambulatory Visit: Payer: Medicare HMO | Admitting: Neurology

## 2021-02-07 VITALS — BP 155/81 | HR 67 | Ht 66.0 in | Wt 135.0 lb

## 2021-02-07 DIAGNOSIS — I6381 Other cerebral infarction due to occlusion or stenosis of small artery: Secondary | ICD-10-CM

## 2021-02-07 DIAGNOSIS — G8929 Other chronic pain: Secondary | ICD-10-CM | POA: Diagnosis not present

## 2021-02-07 DIAGNOSIS — R519 Headache, unspecified: Secondary | ICD-10-CM

## 2021-02-07 MED ORDER — BUTALBITAL-APAP-CAFFEINE 50-325-40 MG PO TABS: 1.0000 | ORAL_TABLET | Freq: Four times a day (QID) | ORAL | 3 refills | Status: DC | PRN

## 2021-02-07 MED ORDER — GABAPENTIN 300 MG PO CAPS: 300.0000 mg | ORAL_CAPSULE | Freq: Three times a day (TID) | ORAL | 5 refills | Status: DC

## 2021-02-07 NOTE — Patient Instructions (Addendum)
Increase gabapentin 300 mg 3 times daily for headache prevention  Stay on for 1 month, if no benefit, let me know Take Fioricet for acute headache

## 2021-02-14 DIAGNOSIS — N8111 Cystocele, midline: Secondary | ICD-10-CM | POA: Diagnosis not present

## 2021-02-14 DIAGNOSIS — R3915 Urgency of urination: Secondary | ICD-10-CM | POA: Diagnosis not present

## 2021-02-14 MED ORDER — METOPROLOL SUCCINATE ER 50 MG PO TB24: 50.0000 mg | ORAL_TABLET | Freq: Every day | ORAL | 3 refills | Status: DC

## 2021-02-21 ENCOUNTER — Other Ambulatory Visit: Payer: Self-pay | Admitting: Cardiovascular Disease

## 2021-02-22 NOTE — Telephone Encounter (Signed)
Yes this is fine thank you

## 2021-03-06 ENCOUNTER — Encounter: Payer: Self-pay | Admitting: Neurology

## 2021-03-06 ENCOUNTER — Telehealth: Payer: Self-pay | Admitting: Family Medicine

## 2021-03-06 DIAGNOSIS — F341 Dysthymic disorder: Secondary | ICD-10-CM

## 2021-03-06 NOTE — Telephone Encounter (Signed)
Refill for :  Alprazolam 1 mg LR 4/49/22, #30, 2 rf LOV 02/05/21 FOV 05/09/21  Please review and advise.   Thanks. Dm/cma

## 2021-03-06 NOTE — Telephone Encounter (Signed)
Caller Name: pt Call back phone #: [phone]  MEDICATION(S): ALPRAZolam Duanne Moron) 1 MG tablet Pt has been out since Friday 7/15  Has the patient contacted their pharmacy? Yes - pharmacy advised they've sent multiple requests  Preferred Pharmacy: [id number] - [place], [place]

## 2021-03-07 MED ORDER — ALPRAZOLAM 1 MG PO TABS: 1.0000 mg | ORAL_TABLET | Freq: Two times a day (BID) | ORAL | 2 refills | Status: DC | PRN

## 2021-03-11 ENCOUNTER — Other Ambulatory Visit: Payer: Self-pay | Admitting: *Deleted

## 2021-03-11 MED ORDER — DULOXETINE HCL 30 MG PO CPEP: 30.0000 mg | ORAL_CAPSULE | Freq: Every day | ORAL | 5 refills | Status: DC

## 2021-03-15 ENCOUNTER — Encounter: Payer: Self-pay | Admitting: Family Medicine

## 2021-03-15 DIAGNOSIS — R1084 Generalized abdominal pain: Secondary | ICD-10-CM

## 2021-03-21 ENCOUNTER — Ambulatory Visit
Admission: RE | Admit: 2021-03-21 | Discharge: 2021-03-21 | Disposition: A | Discharge status: Home / Self Care | Payer: Medicare HMO | Source: Ambulatory Visit | Attending: Family Medicine | Admitting: Family Medicine

## 2021-03-21 DIAGNOSIS — K7689 Other specified diseases of liver: Secondary | ICD-10-CM | POA: Diagnosis not present

## 2021-03-21 DIAGNOSIS — R1012 Left upper quadrant pain: Secondary | ICD-10-CM | POA: Diagnosis not present

## 2021-03-21 DIAGNOSIS — R1084 Generalized abdominal pain: Secondary | ICD-10-CM

## 2021-03-21 DIAGNOSIS — R1013 Epigastric pain: Secondary | ICD-10-CM | POA: Diagnosis not present

## 2021-03-22 ENCOUNTER — Encounter: Payer: Self-pay | Admitting: Family Medicine

## 2021-03-22 ENCOUNTER — Other Ambulatory Visit: Payer: Medicare HMO

## 2021-03-27 ENCOUNTER — Other Ambulatory Visit: Payer: Medicare HMO

## 2021-03-27 ENCOUNTER — Other Ambulatory Visit: Payer: Self-pay

## 2021-03-27 ENCOUNTER — Encounter: Payer: Self-pay | Admitting: Family Medicine

## 2021-03-27 ENCOUNTER — Ambulatory Visit (INDEPENDENT_AMBULATORY_CARE_PROVIDER_SITE_OTHER): Payer: Medicare HMO | Admitting: Family Medicine

## 2021-03-27 VITALS — BP 144/76 | HR 70 | Temp 97.9°F | Ht 66.0 in | Wt 133.8 lb

## 2021-03-27 DIAGNOSIS — N3941 Urge incontinence: Secondary | ICD-10-CM | POA: Diagnosis not present

## 2021-03-27 DIAGNOSIS — Z85060 Personal history of malignant carcinoid tumor of small intestine: Secondary | ICD-10-CM | POA: Diagnosis not present

## 2021-03-27 DIAGNOSIS — G8929 Other chronic pain: Secondary | ICD-10-CM | POA: Diagnosis not present

## 2021-03-27 DIAGNOSIS — R1010 Upper abdominal pain, unspecified: Secondary | ICD-10-CM | POA: Diagnosis not present

## 2021-03-27 NOTE — Progress Notes (Signed)
[facility] [address] [place] [address] Dept: [phone] Dept Fax: [phone]  Office Visit  Subjective:    Patient ID: Amie Portland, female    DOB: August 14, 1951, 70 y.o..   MRN: [id number]  Chief Complaint  Patient presents with   Follow-up    F/u US resullts    History of Present Illness:  Patient is in today for reassessment of her upper abdominal pain. Ms. Gogan notes she developed the pain last winter. The pain is in the epigastrium, but then radiates down the left abdomen. The pain issues were exacerbated around the time of her having a hysterectomy and sacrocolpopexy in March, which was complicated by a ruptured bladder. She has had some work-up and evaluation with CT scan and ultrasound which has not determined a source for her pain. Ms. 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 At that time, she had also been experiencing epigastric pain. It appears her last EGD was in 2019. Since her surgery in March, Ms. Butt has been struggling with urinary frequency, noting she now gets up 4 times a night to urinate. She ahs also had some urinary incontinence.  Past Medical History: Patient Active Problem List   Diagnosis Date Noted   Lacunar infarction ([place]) 02/07/2021   Adrenal cortical nodule ([place]) 02/05/2021   Bladder rupture 12/10/2020   Urinary retention 11/21/2020   Cystocele with prolapse 11/15/2020   Asthma 11/05/2020   Uterine prolapse 11/05/2020   Episodic cluster headache, not intractable 09/14/2020   Chronic left-sided headache 08/09/2020   Cerebral vascular disease 08/09/2020   Depression with anxiety 08/09/2020   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   Closed fracture of right distal radius 01/12/2019   Moderate obstructive sleep apnea 05/21/2017   Vitamin D deficiency 07/17/2016   Abdominal  aortic atherosclerosis ([facility]) 07/16/2016   Renal artery stenosis ([facility]) 06/12/2016   Chronic low back pain 08/24/2015   Hyperlipidemia, mixed 12/17/2014   Hepatic cyst 05/22/2014   Insomnia due to other mental disorder 03/10/2014   Pancreatic cyst 04/19/2013   Osteoporosis 09/07/2012   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   Tricuspid regurgitation, Mild-Moderate 07/21/2011   Mitral regurgitation, Trivial 07/21/2011   Gastroesophageal reflux disease with hiatal hernia 06/17/2011   Endometrial hyperplasia    Diverticulosis    IBS (irritable bowel syndrome) with constipation 04/24/2011   Hiatal hernia 03/04/2011   Basal cell carcinoma (BCC) of face 11/15/2009   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   Essential hypertension 01/04/2009   Past Surgical History:  Procedure Laterality Date   APPENDECTOMY     endoscopic hemoclip     eye surgery Left    lens implant   HYSTEROSCOPY     POLYP   OPEN REDUCTION INTERNAL FIXATION (ORIF) DISTAL RADIAL FRACTURE Left 01/17/2019   Procedure: LEFT OPEN REDUCTION INTERNAL FIXATION (ORIF) DISTAL RADIUS FRACTURE;  Surgeon: Daryll Brod, MD;  Location: [facility];  Service: Orthopedics;  Laterality: Left;  AXILLARY BLOCK   PERIPHERAL VASCULAR CATHETERIZATION Left 07/24/2016   Procedure: Renal Angiography;  Surgeon: Conrad Hublersburg, MD;  Location: [facility];  Service: Cardiovascular;  Laterality: Left;   ROBOTIC ASSISTED BILATERAL SALPINGO OOPHERECTOMY  11/15/2020   ROBOTIC ASSISTED LAPAROSCOPIC SACROCOLPOPEXY Bilateral 11/15/2020   Procedure: XI ROBOTIC ASSISTED LAPAROSCOPIC SACROCOLPOPEXY AND SUPRACERVICIAL HYSTERECTOMY BILATERAL SALPINGO-OOPHERECTOMY;  Surgeon: Ardis Hughs, MD;  [facility];  Service: Urology;  Laterality: Bilateral;  REQUESTING 4.5 HRS   ROBOTIC ASSISTED SUPRACERVICAL HYSTERECTOMY  11/15/2020   UPPER GI ENDOSCOPY     UTERINE FIBROID SURGERY     Family History  Problem Relation Age of Onset   Colon cancer  Mother    Diabetes Mother    Hypertension Mother    Colon polyps Father    Parkinson's disease Father    Colon cancer Maternal Grandmother    Breast cancer Maternal Grandmother 54   Diabetes Paternal Grandmother    Breast cancer Paternal Grandmother 49   Allergies Sister    Arthritis Brother        b/l hip replace   Alcohol abuse Daughter    Arthritis Son    Hypertension Son    Allergies Son    Osteoporosis Son    Osteoporosis Son    Arthritis Son        back disease, DDD   Allergies Son    Arthritis Son    Irritable bowel syndrome Son        RSD   Hypertension Paternal Grandfather    Heart disease Paternal Grandfather    Aneurysm Paternal Grandfather    Colon cancer Maternal Aunt    Colon cancer Cousin        X3   Ovarian cancer Cousin    Outpatient Medications Prior to Visit  Medication Sig Dispense Refill   albuterol (VENTOLIN HFA) 108 (90 Base) MCG/ACT inhaler TAKE 2 PUFFS BY MOUTH EVERY 6 HOURS AS NEEDED FOR WHEEZE OR SHORTNESS OF BREATH 8.5 each 0   ALPRAZolam (XANAX) 1 MG tablet Take 1 tablet (1 mg total) by mouth 2 (two) times daily as needed. 30 tablet 2   atorvastatin (LIPITOR) 20 MG tablet      butalbital-acetaminophen-caffeine (FIORICET) 50-325-40 MG tablet Take 1 tablet by mouth every 6 (six) hours as needed for headache. 10 tablet 3   dicyclomine (BENTYL) 10 MG capsule      diltiazem (CARDIZEM CD) 180 MG 24 hr capsule TAKE 1 CAPSULE BY MOUTH EVERY DAY 90 capsule 3   DULoxetine (CYMBALTA) 30 MG capsule Take 1 capsule (30 mg total) by mouth daily. 30 capsule 5   gabapentin (NEURONTIN) 300 MG capsule Take 1 capsule (300 mg total) by mouth 3 (three) times daily. 90 capsule 5   linaclotide (LINZESS) 72 MCG capsule Take 1 capsule (72 mcg total) by mouth daily before breakfast. 30 capsule 2   metoprolol succinate (TOPROL-XL) 50 MG 24 hr tablet Take 1 tablet (50 mg total) by mouth daily. Take with or immediately following a meal. 90 tablet 3   omeprazole (PRILOSEC)  40 MG capsule Take 40 mg by mouth in the morning and at bedtime.     ondansetron (ZOFRAN) 4 MG tablet Take 1 tablet (4 mg total) by mouth every 8 (eight) hours as needed for nausea or vomiting. 20 tablet 6   polyethylene glycol (MIRALAX / GLYCOLAX) 17 g packet Take 17 g by mouth daily. 14 each 0   promethazine (PHENERGAN) 25 MG tablet Take 25 mg by mouth every 6 (six) hours as needed for nausea or vomiting.     sucralfate (CARAFATE) 1 g tablet TAKE 1 TABLET BY MOUTH FOUR TIMES A DAY AS NEEDED     No facility-administered medications prior to visit.   Allergies  Allergen Reactions   Sulfamethoxazole Shortness Of Breath    chest tightness   Amitriptyline Anxiety and Other (See Comments)    Depression, insomnia   Effexor [Venlafaxine]  Palpitations, hypertension   Lactose Intolerance (Gi) Diarrhea and Nausea And Vomiting   Lexapro [Escitalopram Oxalate]     nausea   Prednisone Palpitations    Objective:   Today's Vitals   03/27/21 0926  BP: (!) 144/76  Pulse: 70  Temp: 97.9 F (36.6 C)  TempSrc: Temporal  SpO2: 98%  Weight: 133 lb 12.8 oz (60.7 kg)  Height: '5\' 6"'$  (1.676 m)   Body mass index is 21.6 kg/m.   General: Well developed, well nourished. No acute distress. Abdomen: Soft, mild to moderate tenderness in the epigastrium and towards the LUQ. No rebound or guarding. Psych: Alert and oriented. Normal mood and affect.  Health Maintenance Due  Topic Date Due   COVID-19 Vaccine (1) Never done   Hepatitis C Screening  Never done   Zoster Vaccines- Shingrix (1 of 2) Never done   PNA vac Low Risk Adult (1 of 2 - PCV13) Never done   INFLUENZA VACCINE  03/18/2021   Imaging: Abdominal ultrasound (03/21/2021) IMPRESSION: 1. A cause for the patient's abdominal symptoms is not identified. 2. Incidental left hepatic lobe cyst as seen on prior CT scan.  Pelvic ultrasound (01/10/2021) IMPRESSION: 1. Prior hysterectomy with nonvisualization of the uterus and ovaries. 2.  Negative transabdominal pelvic ultrasound with no pelvic or adnexal mass or abnormal free fluid. No findings to explain patient's symptoms identified.  CT of Abdomen and Pelvis (11/21/2020) IMPRESSION: 1. Suspected defect in the posterior urinary bladder with intraperitoneal fluid about the urinary bladder and signs of urinary retention. Urologic consultation is suggested for decompression of the urinary bladder and further assessment a bladder leak. 2. Position of distal RIGHT ureter with respect to the suspected bladder wall defect does not allow for complete exclusion of distal ureteral injury attention on follow-up is suggested. 3. Mild RIGHT greater than LEFT hydronephrosis likely related to markedly distended urinary bladder. 4. Small bilateral effusions and patchy airspace disease, irregular appearance raises the question of infection. Correlate with any respiratory symptoms. 5. Stable 1.4 cm well-circumscribed nodule in the right adrenal gland. This measured 146 Hounsfield units on the previous exam post-contrast, 82 Hounsfield units on today's study. Given density on the previous imaging study, follow-up adrenal protocol may be helpful as pheochromocytoma can show increased density on venous phase, greater than 130 Hounsfield units on the prior study, therefore, biochemical correlation may also be warranted. 6. Aortic atherosclerosis.    Assessment & Plan:   1. Chronic upper abdominal pain 2. History of malignant carcinoid tumor of duodenum I reviewed old consult notes, EGD report, and imaging studies. I am concerned for possible gastric sources of her current epigastric pain. She has been through considerable stressors over the past year. I am concerned for new pathology, such as an ulcer, or a recurrence of her carcinoid tumor. In review of the EGD report from 01/21/2018, it was noted there was a small nodule at the scar site, which was questionably a polyp. I wonder if this might need  re-examination. I will refer Ms. Natt back to Dr. Jerene Pitch for consideration of this.    Ambulatory referral to Gastroenterology  3. Urge incontinence Since Ms. Snethen underwent hysterectomy with salpingoophorectomy and sacrocolpopexy, she is now having urinary frequency, nocturia, and urge incontinence. She has no evidence of diabetes or infection. She is not interested in returning to her prior urologist in light of her significant post-operative complication.  I will refer her to a different urology practice for assessment of her incontinence, in light of her  complicated surgical history.  - Ambulatory referral to Urology  Haydee Salter, MD

## 2021-03-28 ENCOUNTER — Encounter: Payer: Self-pay | Admitting: Family Medicine

## 2021-04-02 ENCOUNTER — Other Ambulatory Visit: Payer: Self-pay | Admitting: Emergency Medicine

## 2021-04-02 ENCOUNTER — Other Ambulatory Visit: Payer: Self-pay | Admitting: Neurology

## 2021-04-09 ENCOUNTER — Encounter: Payer: Self-pay | Admitting: Family Medicine

## 2021-04-09 ENCOUNTER — Telehealth: Payer: Self-pay

## 2021-04-09 DIAGNOSIS — L659 Nonscarring hair loss, unspecified: Secondary | ICD-10-CM

## 2021-04-09 NOTE — Telephone Encounter (Signed)
Patient called to schedule AEX. SHe wanted [place] to know that Dr. Louis Meckel at [facility]  performed hysterectomy and sling back in March 2022. She questions if she still needs AEX.  Advised well woman exam recommended yearly.  She would like to schedule. Message sent to  appt desk to call and schedule.

## 2021-04-15 ENCOUNTER — Other Ambulatory Visit: Payer: Self-pay

## 2021-04-15 ENCOUNTER — Ambulatory Visit (INDEPENDENT_AMBULATORY_CARE_PROVIDER_SITE_OTHER): Payer: Medicare HMO | Admitting: Family Medicine

## 2021-04-15 ENCOUNTER — Encounter: Payer: Self-pay | Admitting: Family Medicine

## 2021-04-15 VITALS — BP 118/60 | HR 63 | Temp 97.4°F | Ht 66.0 in | Wt 133.4 lb

## 2021-04-15 DIAGNOSIS — M26622 Arthralgia of left temporomandibular joint: Secondary | ICD-10-CM

## 2021-04-15 DIAGNOSIS — L650 Telogen effluvium: Secondary | ICD-10-CM

## 2021-04-15 DIAGNOSIS — Z23 Encounter for immunization: Secondary | ICD-10-CM | POA: Diagnosis not present

## 2021-04-15 DIAGNOSIS — S0083XA Contusion of other part of head, initial encounter: Secondary | ICD-10-CM

## 2021-04-15 NOTE — Patient Instructions (Addendum)
Telogen Effluvium  Telogen effluvium is a condition in which the body sheds much hair. People describe that they are losing hair "from the roots" in excessive amounts. The hair loss is usually 3 to 6 months following an event. The inciting event may be childbirth, a surgery, an illness with a fever, a traumatic psychological event, weight loss, or the start of a new medication. Some people have no known inciting event. For most people, no treatment is necessary, and the hair will stop falling out and begin to regrow with time. Some women develop a chronic form of telogen effluvium in which they continue to lose hair at an accelerated rate.   Use heat over area of bruise. Use Aleve or ibuprofen as needed for pain.

## 2021-04-15 NOTE — Progress Notes (Signed)
[facility] [address] [place] [address] Dept: [phone] Dept Fax: [phone]  Office Visit  Subjective:    Patient ID: Megan [name], female    DOB: August 27, 1950, 70 y.o..   MRN: [id number]  Chief Complaint  Patient presents with   Follow-up    Pt c/o throbbing pain and knot on left side of head after hitting on cabinet. Tried ice and pain relievers, no relief x 5days.    History of Present Illness:  Patient is in today for assessment of a contusion to her left temple area. She notes that 5 days ago, she accidentally turned into a cabinet door, striking the side of her head. She developed a swollen area in fairly quick order with associated pain. She denies LOC. She notes the swelling has since gone down, but this area remains quite touchy.  Megan [name] had reached out recently to complain of hair loss. She has noted hair coming out much more than usual. She denies any areas of baldness. She has noted an overall thinning of the hair. She notes that her hair dresser commented on this as well.   Megan [name] continues to complain of left ear pain that is bothersome. She feels the injury may have exacerbated this.  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   Closed fracture of right distal radius 01/12/2019   Moderate obstructive sleep apnea 05/21/2017   Vitamin D deficiency 07/17/2016   Abdominal aortic  atherosclerosis ([place]) 07/16/2016   Renal artery stenosis ([place]) 06/12/2016   Chronic low back pain 08/24/2015   Hyperlipidemia, mixed 12/17/2014   Hepatic cyst 05/22/2014   Insomnia due to other mental disorder 03/10/2014   Pancreatic cyst 04/19/2013   Osteoporosis 09/07/2012   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   Tricuspid regurgitation, Mild-Moderate 07/21/2011   Mitral regurgitation, Trivial 07/21/2011   Gastroesophageal reflux disease with hiatal hernia 06/17/2011   Endometrial hyperplasia    Diverticulosis    IBS (irritable bowel syndrome) with constipation 04/24/2011   Hiatal hernia 03/04/2011   Basal cell carcinoma (BCC) of face 11/15/2009   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   Essential hypertension 01/04/2009   Past Surgical History:  Procedure Laterality Date   APPENDECTOMY     endoscopic hemoclip     eye surgery Left    lens implant   HYSTEROSCOPY     POLYP   OPEN REDUCTION INTERNAL FIXATION (ORIF) DISTAL RADIAL FRACTURE Left 01/17/2019   Procedure: LEFT OPEN REDUCTION INTERNAL FIXATION (ORIF) DISTAL RADIUS FRACTURE;  Surgeon: Daryll Brod, MD;  Location: [facility];  Service: Orthopedics;  Laterality: Left;  AXILLARY BLOCK   PERIPHERAL VASCULAR CATHETERIZATION Left 07/24/2016   Procedure: Renal Angiography;  Surgeon: Conrad Anton Ruiz, MD;  Location: [facility];  Service: Cardiovascular;  Laterality: Left;   ROBOTIC ASSISTED BILATERAL SALPINGO OOPHERECTOMY  11/15/2020   ROBOTIC ASSISTED LAPAROSCOPIC SACROCOLPOPEXY Bilateral 11/15/2020   Procedure: XI ROBOTIC ASSISTED LAPAROSCOPIC SACROCOLPOPEXY AND SUPRACERVICIAL HYSTERECTOMY BILATERAL SALPINGO-OOPHERECTOMY;  Surgeon: Ardis Hughs, MD;  Location: [facility];  Service: Urology;  Laterality: Bilateral;  REQUESTING 4.5 HRS   ROBOTIC ASSISTED SUPRACERVICAL HYSTERECTOMY  11/15/2020   UPPER GI ENDOSCOPY     UTERINE FIBROID SURGERY     Family History  Problem Relation Age of Onset   Colon cancer Mother     Diabetes Mother    Hypertension Mother    Colon polyps Father    Parkinson's disease Father    Colon cancer Maternal Grandmother    Breast cancer Maternal Grandmother 25   Diabetes Paternal Grandmother    Breast cancer Paternal Grandmother 18   Allergies Sister    Arthritis Brother        b/l hip replace   Alcohol abuse Daughter    Arthritis Son    Hypertension Son    Allergies Son    Osteoporosis Son    Osteoporosis Son    Arthritis Son        back disease, DDD   Allergies Son    Arthritis Son    Irritable bowel syndrome Son        RSD   Hypertension Paternal Grandfather    Heart disease Paternal Grandfather    Aneurysm Paternal Grandfather    Colon cancer Maternal Aunt    Colon cancer Cousin        X3   Ovarian cancer Cousin    Outpatient Medications Prior to Visit  Medication Sig Dispense Refill   albuterol (VENTOLIN HFA) 108 (90 Base) MCG/ACT inhaler TAKE 2 PUFFS BY MOUTH EVERY 6 HOURS AS NEEDED FOR WHEEZE OR SHORTNESS OF BREATH 8.5 each 0   ALPRAZolam (XANAX) 1 MG tablet Take 1 tablet (1 mg total) by mouth 2 (two) times daily as needed. 30 tablet 2   dicyclomine (BENTYL) 10 MG capsule      diltiazem (CARDIZEM CD) 180 MG 24 hr capsule TAKE 1 CAPSULE BY MOUTH EVERY DAY 90 capsule 3   gabapentin (NEURONTIN) 300 MG capsule Take 1 capsule (300 mg total) by mouth 3 (three) times daily. 90 capsule 5   linaclotide (LINZESS) 72 MCG capsule Take 1 capsule (72 mcg total) by mouth daily before breakfast. 30 capsule 2   metoprolol succinate (TOPROL-XL) 50 MG 24 hr tablet Take 1 tablet (50 mg total) by mouth daily. Take with or immediately following a meal. 90 tablet 3   omeprazole (PRILOSEC) 40 MG capsule Take 40 mg by mouth in the morning and at bedtime.     ondansetron (ZOFRAN) 4 MG tablet Take 1 tablet (4 mg total) by mouth every 8 (eight) hours as needed for nausea or vomiting. 20 tablet 6   polyethylene glycol (MIRALAX / GLYCOLAX) 17 g packet Take 17 g by mouth daily. 14  each 0   promethazine (PHENERGAN) 25 MG tablet Take 25 mg by mouth every 6 (six) hours as needed for nausea or vomiting.     sucralfate (CARAFATE) 1 g tablet TAKE 1 TABLET BY MOUTH FOUR TIMES A DAY AS NEEDED     atorvastatin (LIPITOR) 20 MG tablet      DULoxetine (CYMBALTA) 30 MG capsule TAKE 1 CAPSULE BY MOUTH EVERY DAY (Patient not taking: Reported on 04/15/2021) 90 capsule 2   butalbital-acetaminophen-caffeine (FIORICET) 50-325-40 MG tablet Take 1 tablet by mouth every 6 (six) hours as needed for headache. 10 tablet 3   No facility-administered medications prior to visit.   Allergies  Allergen Reactions   Sulfamethoxazole Shortness Of Breath    chest tightness   Amitriptyline Anxiety and Other (  See Comments)    Depression, insomnia   Effexor [Venlafaxine]     Palpitations, hypertension   Lactose Intolerance (Gi) Diarrhea and Nausea And Vomiting   Lexapro [Escitalopram Oxalate]     nausea   Prednisone Palpitations   Objective:   Today's Vitals   04/15/21 1436  BP: 118/60  Pulse: 63  Temp: (!) 97.4 F (36.3 C)  TempSrc: Temporal  SpO2: 99%  Weight: 133 lb 6.4 oz (60.5 kg)  Height: '5\' 6"'$  (1.676 m)   Body mass index is 21.53 kg/m.   General: Well developed, well nourished. No acute distress. HEENT: Normocephalic, non-traumatic. No obvious swelling or bruising at the temple, but mild   tenderness to palpation. PERRL, EOMI. Conjunctiva clear. Fundiscopic exam shows normal disc and   vasculature. Left external ear normal. EAC and TMs normal. There is apparent subluxation of the left   TMJ with opening and closing of the mouth and some clicking. Scalp: No area of baldness noted. All follicles appear to have a hair present. Neuro:No focal neurological signs. Psych: Alert and oriented. Normal mood and affect.  Health Maintenance Due  Topic Date Due   COVID-19 Vaccine (1) Never done   Hepatitis C Screening  Never done   Zoster Vaccines- Shingrix (1 of 2) Never done   PNA  vac Low Risk Adult (1 of 2 - PCV13) Never done   INFLUENZA VACCINE  03/18/2021     Lab results: Lab Results  Component Value Date   TSH 0.60 12/31/2020   Assessment & Plan:   1. Contusion of other part of head, initial encounter Patient appears to have a resolving contusion. I recommend she continue to use heat and use OTC Aleve or ibuprofen as needed.  2. 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 Ms. Buracker had a significant health event earlier this year which is a likely cause for this to have occurred. I reassured her that her hair should grow back normally.  3. Arthralgia of left temporomandibular joint I suspect her ear pain is actually from a TMJ dysfunction. She is undergoing a procedure in 2 weeks, so will delay consideration for maxillofacial surgery referral to this is completed.  4. Need for influenza vaccination  - Flu Vaccine QUAD High Dose(Fluad)  Haydee Salter, MD

## 2021-05-02 DIAGNOSIS — K2970 Gastritis, unspecified, without bleeding: Secondary | ICD-10-CM | POA: Diagnosis not present

## 2021-05-02 DIAGNOSIS — R109 Unspecified abdominal pain: Secondary | ICD-10-CM | POA: Diagnosis not present

## 2021-05-02 DIAGNOSIS — K2950 Unspecified chronic gastritis without bleeding: Secondary | ICD-10-CM | POA: Diagnosis not present

## 2021-05-02 DIAGNOSIS — G4733 Obstructive sleep apnea (adult) (pediatric): Secondary | ICD-10-CM | POA: Diagnosis not present

## 2021-05-03 ENCOUNTER — Encounter: Payer: Self-pay | Admitting: Family Medicine

## 2021-05-07 ENCOUNTER — Ambulatory Visit: Payer: Medicare HMO

## 2021-05-08 ENCOUNTER — Other Ambulatory Visit: Payer: Self-pay | Admitting: Family Medicine

## 2021-05-08 DIAGNOSIS — J4520 Mild intermittent asthma, uncomplicated: Secondary | ICD-10-CM

## 2021-05-09 ENCOUNTER — Ambulatory Visit: Payer: Medicare HMO | Admitting: Family Medicine

## 2021-05-20 NOTE — Progress Notes (Signed)
70 y.o. [id number] Married White or Caucasian Not Hispanic or Latino female here for annual exam.  No vaginal bleeding. Not sexually active currently, hasn't been since surgery in 3/22, she has been afraid to be sexually active.   She had a robotic-assisted laparoscopic supracervical hysterectomy/BSO/sacrocolpopexy in 3/22 with Urology. She was admitted post op with bladder injury, urinary retention, sepsis. A catheter was placed, she had for 2-3 weeks. The injury healed without surgery.   She isn't voiding normally, she has urgency to void, during the day she voids small amounts. Not voiding frequently. At night she wakes up 1-2 x a night to void, then voids large amounts. She doesn't feel she empties well.   She would like to see another Urologist, having trouble getting an appointment.   She has issues with her bowels, goes from hard to loose.   Last year her dexa showed osteopenia with an elevated risk of fracture.  She is on medication for GERD. She is seen by a GI at [facility].     No LMP recorded. Patient is postmenopausal.          Sexually active: No.  The current method of family planning is PMP Exercising: Yes.     Walking  Smoker:  no  Health Maintenance: Pap:  03/21/20 WNL, 02/05/15 normal  History of abnormal Pap:  no MMG:  11/07/20 Bi-rads 1 neg  BMD:   04/24/2020 osteopenia with elevated risk of fracture. FRAX 18/3.4%. She has a h/o a fragility fracture.  Colonoscopy: 01/21/18 f/u 5 years  TDaP:  09/11/14  Gardasil: none    reports that she quit smoking about 25 years ago. Her smoking use included cigarettes. She has never used smokeless tobacco. She reports that she does not drink alcohol and does not use drugs.  Past Medical History:  Diagnosis Date   Adrenal adenoma 05/22/2014   Adrenal gland cyst ([place]) 05/22/2014   Allergic state 12/31/2012   Anxiety    Asthma    triggerred by fragrances    Barrett esophagus    Blind loop syndrome    C. difficile diarrhea    Cancer ([place])     pt denies    Chest pain, atypical 07/14/2011   Cold sore 07/17/2016   Colon polyp    Contact dermatitis 03/23/2012   Cystocele, midline    Depression    Diarrhea    Diverticulosis    Endometrial hyperplasia 02/27/2006   BENIGN ENDO BX ON 02/2007   GERD (gastroesophageal reflux disease)    Headache 04/16/2015   Headache    Headache(784.0) 04/15/2012   pt denies    Heart murmur    Hematuria 04/27/2012   Hepatic cyst    Hiatal hernia    Hiatal hernia    Hyperlipidemia, mixed 12/17/2014   Hypertension    IBS (irritable bowel syndrome)    Insomnia    Leaky heart valve    Low back pain 08/15/2013   Osteoporosis 03/2017   T score -2.5. 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   Sinusitis acute 02/03/2012   Thrush 07/21/2011   Tricuspid regurgitation 07/21/2011   Unspecified hypothyroidism    Unspecified menopausal and postmenopausal disorder    Uterine prolapse without mention of vaginal wall prolapse    Vitamin B12 deficiency    Vitamin D  deficiency 07/17/2016    Past Surgical History:  Procedure Laterality Date   APPENDECTOMY     endoscopic hemoclip     eye surgery Left    lens implant   HYSTEROSCOPY     POLYP   OPEN REDUCTION INTERNAL FIXATION (ORIF) DISTAL RADIAL FRACTURE Left 01/17/2019   Procedure: LEFT OPEN REDUCTION INTERNAL FIXATION (ORIF) DISTAL RADIUS FRACTURE;  Surgeon: Daryll Brod, MD;  Location: [facility];  Service: Orthopedics;  Laterality: Left;  AXILLARY BLOCK   PERIPHERAL VASCULAR CATHETERIZATION Left 07/24/2016   Procedure: Renal Angiography;  Surgeon: Conrad [place], MD;  Location: [facility];  Service: Cardiovascular;  Laterality: Left;   ROBOTIC ASSISTED BILATERAL SALPINGO OOPHERECTOMY  11/15/2020   ROBOTIC ASSISTED LAPAROSCOPIC SACROCOLPOPEXY  Bilateral 11/15/2020   Procedure: XI ROBOTIC ASSISTED LAPAROSCOPIC SACROCOLPOPEXY AND SUPRACERVICIAL HYSTERECTOMY BILATERAL SALPINGO-OOPHERECTOMY;  Surgeon: Ardis Hughs, MD;  Location: [facility];  Service: Urology;  Laterality: Bilateral;  REQUESTING 4.5 HRS   ROBOTIC ASSISTED SUPRACERVICAL HYSTERECTOMY  11/15/2020   UPPER GI ENDOSCOPY     UTERINE FIBROID SURGERY      Current Outpatient Medications  Medication Sig Dispense Refill   albuterol (VENTOLIN HFA) 108 (90 Base) MCG/ACT inhaler TAKE 2 PUFFS BY MOUTH EVERY 6 HOURS AS NEEDED FOR WHEEZE OR SHORTNESS OF BREATH 8.5 each 1   ALPRAZolam (XANAX) 1 MG tablet Take 1 tablet (1 mg total) by mouth 2 (two) times daily as needed. 30 tablet 2   atorvastatin (LIPITOR) 20 MG tablet      dicyclomine (BENTYL) 10 MG capsule      diltiazem (CARDIZEM CD) 180 MG 24 hr capsule TAKE 1 CAPSULE BY MOUTH EVERY DAY 90 capsule 3   DULoxetine (CYMBALTA) 30 MG capsule TAKE 1 CAPSULE BY MOUTH EVERY DAY (Patient not taking: Reported on 04/15/2021) 90 capsule 2   gabapentin (NEURONTIN) 300 MG capsule Take 1 capsule (300 mg total) by mouth 3 (three) times daily. 90 capsule 5   linaclotide (LINZESS) 72 MCG capsule Take 1 capsule (72 mcg total) by mouth daily before breakfast. 30 capsule 2   metoprolol succinate (TOPROL-XL) 50 MG 24 hr tablet Take 1 tablet (50 mg total) by mouth daily. Take with or immediately following a meal. 90 tablet 3   omeprazole (PRILOSEC) 40 MG capsule Take 40 mg by mouth in the morning and at bedtime.     ondansetron (ZOFRAN) 4 MG tablet Take 1 tablet (4 mg total) by mouth every 8 (eight) hours as needed for nausea or vomiting. 20 tablet 6   polyethylene glycol (MIRALAX / GLYCOLAX) 17 g packet Take 17 g by mouth daily. 14 each 0   promethazine (PHENERGAN) 25 MG tablet Take 25 mg by mouth every 6 (six) hours as needed for nausea or vomiting.     sucralfate (CARAFATE) 1 g tablet TAKE 1 TABLET BY MOUTH FOUR TIMES A DAY AS NEEDED     No current  facility-administered medications for this visit.    Family History  Problem Relation Age of Onset   Colon cancer Mother    Diabetes Mother    Hypertension Mother    Colon polyps Father    Parkinson's disease Father    Colon cancer Maternal Grandmother    Breast cancer Maternal Grandmother 52   Diabetes Paternal Grandmother    Breast cancer Paternal Grandmother 105   Allergies Sister    Arthritis Brother        b/l hip replace   Alcohol abuse Daughter  Arthritis Son    Hypertension Son    Allergies Son    Osteoporosis Son    Osteoporosis Son    Arthritis Son        back disease, DDD   Allergies Son    Arthritis Son    Irritable bowel syndrome Son        RSD   Hypertension Paternal Grandfather    Heart disease Paternal Grandfather    Aneurysm Paternal Grandfather    Colon cancer Maternal Aunt    Colon cancer Cousin        X3   Ovarian cancer Cousin     Review of Systems  All other systems reviewed and are negative.  Exam:   There were no vitals taken for this visit.  Weight change: @WEIGHTCHANGE @ Height:      Ht Readings from Last 3 Encounters:  04/15/21 5\' 6"  (1.676 m)  03/27/21 5\' 6"  (1.676 m)  02/07/21 5\' 6"  (1.676 m)    General appearance: alert, cooperative and appears stated age Head: Normocephalic, without obvious abnormality, atraumatic Neck: no adenopathy, supple, symmetrical, trachea midline and thyroid normal to inspection and palpation Breasts: normal appearance, no masses or tenderness Abdomen: soft, non-tender; non distended,  no masses,  no organomegaly Extremities: extremities normal, atraumatic, no cyanosis or edema Skin: Skin color, texture, turgor normal. No rashes or lesions Lymph nodes: Cervical, supraclavicular, and axillary nodes normal. No abnormal inguinal nodes palpated Neurologic: Grossly normal   Pelvic: External genitalia:  no lesions              Urethra:  normal appearing urethra with no masses, tenderness or lesions               Bartholins and Skenes: normal                 Vagina: atrophic appearing vagina with normal color and discharge, no lesions, no prolapse. Able to insert 2 fingers part way.               Cervix: no lesions               Bimanual Exam:  Uterus:  uterus absent              Adnexa: no mass, fullness, tenderness               Rectovaginal: Confirms               Anus:  normal sphincter tone, no lesions  Gae Dry chaperoned for the exam.  1. Encounter for gynecological examination without abnormal finding Discussed breast self exam Discussed calcium and vit D intake No pap this year Mammogram and colonoscopy are UTD  2. Urinary urgency -She has been having issues since her surgery earlier this year -Multiple urinary c/o, will refer to Urogyn (doesn't want to go back to her Urologist) - Urinalysis,Complete w/RFL Culture - Urine Culture  3. Nocturia - Urinalysis,Complete w/RFL Culture - Urine Culture  4. Abnormal urine odor - Urinalysis,Complete w/RFL Culture - Urine Culture  5. Vaginal atrophy Discussed using a lubricant with intercourse, she should control the rate and depth of penetration Vaginal estrogen is an option  6. Age-related osteoporosis without current pathological fracture Discussed calcium/vit d/exercise Recommend treatment. Reviewed the risks and side effects of Fosamax. Will confirm with GI that he is okay with her taking Fosamax.  -Recent normal CBC and CMP - VITAMIN D 25 Hydroxy (Vit-D Deficiency, Fractures) - Magnesium - Phosphorus -Information on osteoporosis  treatment from UTD was given -Repeat DEXA in one year  7. H/O healed fragility fracture See above  8. Vitamin D deficiency - VITAMIN D 25 Hydroxy (Vit-D Deficiency, Fractures)  In addition to the breast and pelvic exam, over 40 minutes was spent in total patient care dealing with her multiple issues (see above).

## 2021-05-21 ENCOUNTER — Other Ambulatory Visit: Payer: Self-pay

## 2021-05-21 ENCOUNTER — Encounter: Payer: Self-pay | Admitting: Obstetrics and Gynecology

## 2021-05-21 ENCOUNTER — Telehealth: Payer: Self-pay

## 2021-05-21 ENCOUNTER — Telehealth: Payer: Self-pay | Admitting: *Deleted

## 2021-05-21 ENCOUNTER — Ambulatory Visit (INDEPENDENT_AMBULATORY_CARE_PROVIDER_SITE_OTHER): Payer: Medicare HMO | Admitting: Obstetrics and Gynecology

## 2021-05-21 VITALS — BP 150/74 | HR 72 | Ht 64.75 in | Wt 136.0 lb

## 2021-05-21 DIAGNOSIS — R351 Nocturia: Secondary | ICD-10-CM | POA: Diagnosis not present

## 2021-05-21 DIAGNOSIS — R8290 Unspecified abnormal findings in urine: Secondary | ICD-10-CM | POA: Diagnosis not present

## 2021-05-21 DIAGNOSIS — E559 Vitamin D deficiency, unspecified: Secondary | ICD-10-CM

## 2021-05-21 DIAGNOSIS — M810 Age-related osteoporosis without current pathological fracture: Secondary | ICD-10-CM

## 2021-05-21 DIAGNOSIS — R3915 Urgency of urination: Secondary | ICD-10-CM | POA: Diagnosis not present

## 2021-05-21 DIAGNOSIS — Z87310 Personal history of (healed) osteoporosis fracture: Secondary | ICD-10-CM

## 2021-05-21 DIAGNOSIS — N952 Postmenopausal atrophic vaginitis: Secondary | ICD-10-CM | POA: Diagnosis not present

## 2021-05-21 DIAGNOSIS — Z01419 Encounter for gynecological examination (general) (routine) without abnormal findings: Secondary | ICD-10-CM

## 2021-05-21 NOTE — Telephone Encounter (Signed)
Called place to Dr. Jerene Pitch at [facility] [phone] to inquire for Dr. Talbert Nan about placing patient on Fosamax. Waiting for call back from office.

## 2021-05-21 NOTE — Telephone Encounter (Signed)
Spoke to Nurse Anderson [place] instructions for Fosamax are as follows:Take with full glass of water. Stay up right for 2 hours after and continue taking omeprazole. Stop if gastritis or esophagitis gets worse.

## 2021-05-21 NOTE — Telephone Encounter (Signed)
Sent referral over. I will close once it is scheduled.

## 2021-05-21 NOTE — Telephone Encounter (Signed)
-----   Message from Salvadore Dom, MD sent at 05/21/2021  9:14 AM EDT ----- Please send a referral to Dr Wannetta Sender in [place] for this patient. She is having urinary urgency, nocturia. She had surgery with Urology with complications and isn't happy with that provider.  Thanks, Sharee Pimple

## 2021-05-21 NOTE — Patient Instructions (Signed)
EXERCISE   We recommended that you start or continue a regular exercise program for good health. Physical activity is anything that gets your body moving, some is better than none. The CDC recommends 150 minutes per week of Moderate-Intensity Aerobic Activity and 2 or more days of Muscle Strengthening Activity.  Benefits of exercise are limitless: helps weight loss/weight maintenance, improves mood and energy, helps with depression and anxiety, improves sleep, tones and strengthens muscles, improves balance, improves bone density, protects from chronic conditions such as heart disease, high blood pressure and diabetes and so much more. To learn more visit: https://www.cdc.gov/physicalactivity/index.html  DIET: Good nutrition starts with a healthy diet of fruits, vegetables, whole grains, and lean protein sources. Drink plenty of water for hydration. Minimize empty calories, sodium, sweets. For more information about dietary recommendations visit: https://health.gov/our-work/nutrition-physical-activity/dietary-guidelines and https://www.myplate.gov/  ALCOHOL:  Women should limit their alcohol intake to no more than 7 drinks/beers/glasses of wine (combined, not each!) per week. Moderation of alcohol intake to this level decreases your risk of breast cancer and liver damage.  If you are concerned that you may have a problem, or your friends have told you they are concerned about your drinking, there are many resources to help. A well-known program that is free, effective, and available to all people all over the nation is Alcoholics Anonymous.  Check out this site to learn more: https://www.aa.org/   CALCIUM AND VITAMIN D:  Adequate intake of calcium and Vitamin D are recommended for bone health.  You should be getting between 1000-1200 mg of calcium and 800 units of Vitamin D daily between diet and supplements  PAP SMEARS:  Pap smears, to check for cervical cancer or precancers,  have traditionally been  done yearly, scientific advances have shown that most women can have pap smears less often.  However, every woman still should have a physical exam from her gynecologist every year. It will include a breast check, inspection of the vulva and vagina to check for abnormal growths or skin changes, a visual exam of the cervix, and then an exam to evaluate the size and shape of the uterus and ovaries. We will also provide age appropriate advice regarding health maintenance, like when you should have certain vaccines, screening for sexually transmitted diseases, bone density testing, colonoscopy, mammograms, etc.   MAMMOGRAMS:  All women over 40 years old should have a routine mammogram.   COLON CANCER SCREENING: Now recommend starting at age 45. At this time colonoscopy is not covered for routine screening until 50. There are take home tests that can be done between 45-49.   COLONOSCOPY:  Colonoscopy to screen for colon cancer is recommended for all women at age 50.  We know, you hate the idea of the prep.  We agree, BUT, having colon cancer and not knowing it is worse!!  Colon cancer so often starts as a polyp that can be seen and removed at colonscopy, which can quite literally save your life!  And if your first colonoscopy is normal and you have no family history of colon cancer, most women don't have to have it again for 10 years.  Once every ten years, you can do something that may end up saving your life, right?  We will be happy to help you get it scheduled when you are ready.  Be sure to check your insurance coverage so you understand how much it will cost.  It may be covered as a preventative service at no cost, but you should check   your particular policy.      Breast Self-Awareness Breast self-awareness means being familiar with how your breasts look and feel. It involves checking your breasts regularly and reporting any changes to your health care provider. Practicing breast self-awareness is  important. A change in your breasts can be a sign of a serious medical problem. Being familiar with how your breasts look and feel allows you to find any problems early, when treatment is more likely to be successful. All women should practice breast self-awareness, including women who have had breast implants. How to do a breast self-exam One way to learn what is normal for your breasts and whether your breasts are changing is to do a breast self-exam. To do a breast self-exam: Look for Changes  Remove all the clothing above your waist. Stand in front of a mirror in a room with good lighting. Put your hands on your hips. Push your hands firmly downward. Compare your breasts in the mirror. Look for differences between them (asymmetry), such as: Differences in shape. Differences in size. Puckers, dips, and bumps in one breast and not the other. Look at each breast for changes in your skin, such as: Redness. Scaly areas. Look for changes in your nipples, such as: Discharge. Bleeding. Dimpling. Redness. A change in position. Feel for Changes Carefully feel your breasts for lumps and changes. It is best to do this while lying on your back on the floor and again while sitting or standing in the shower or tub with soapy water on your skin. Feel each breast in the following way: Place the arm on the side of the breast you are examining above your head. Feel your breast with the other hand. Start in the nipple area and make  inch (2 cm) overlapping circles to feel your breast. Use the pads of your three middle fingers to do this. Apply light pressure, then medium pressure, then firm pressure. The light pressure will allow you to feel the tissue closest to the skin. The medium pressure will allow you to feel the tissue that is a little deeper. The firm pressure will allow you to feel the tissue close to the ribs. Continue the overlapping circles, moving downward over the breast until you feel your  ribs below your breast. Move one finger-width toward the center of the body. Continue to use the  inch (2 cm) overlapping circles to feel your breast as you move slowly up toward your collarbone. Continue the up and down exam using all three pressures until you reach your armpit.  Write Down What You Find  Write down what is normal for each breast and any changes that you find. Keep a written record with breast changes or normal findings for each breast. By writing this information down, you do not need to depend only on memory for size, tenderness, or location. Write down where you are in your menstrual cycle, if you are still menstruating. If you are having trouble noticing differences in your breasts, do not get discouraged. With time you will become more familiar with the variations in your breasts and more comfortable with the exam. How often should I examine my breasts? Examine your breasts every month. If you are breastfeeding, the best time to examine your breasts is after a feeding or after using a breast pump. If you menstruate, the best time to examine your breasts is 5-7 days after your period is over. During your period, your breasts are lumpier, and it may be more   difficult to notice changes. When should I see my health care provider? See your health care provider if you notice: A change in shape or size of your breasts or nipples. A change in the skin of your breast or nipples, such as a reddened or scaly area. Unusual discharge from your nipples. A lump or thick area that was not there before. Pain in your breasts. Anything that concerns you. Osteoporosis Osteoporosis happens when the bones become thin and less dense than normal. Osteoporosis makes bones more brittle and fragile and more likely to break (fracture). Over time, osteoporosis can cause your bones to become so weak that they fracture after a minor fall. Bones in the hip, wrist, and spine are most likely to fracture  due to osteoporosis. What are the causes? The exact cause of this condition is not known. What increases the risk? You are more likely to develop this condition if you: Have family members with this condition. Have poor nutrition. Use the following: Steroid medicines, such as prednisone. Anti-seizure medicines. Nicotine or tobacco, such as cigarettes, e-cigarettes, and chewing tobacco. Are female. Are age 81 or older. Are not physically active (are sedentary). Are of European or Asian descent. Have a small body frame. What are the signs or symptoms? A fracture might be the first sign of osteoporosis, especially if the fracture results from a fall or injury that usually would not cause a bone to break. Other signs and symptoms include: Pain in the neck or low back. Stooped posture. Loss of height. How is this diagnosed? This condition may be diagnosed based on: Your medical history. A physical exam. A bone mineral density test, also called a DXA or DEXA test (dual-energy X-ray absorptiometry test). This test uses X-rays to measure the amount of minerals in your bones. How is this treated? This condition may be treated by: Making lifestyle changes, such as: Including foods with more calcium and vitamin D in your diet. Doing weight-bearing and muscle-strengthening exercises. Stopping tobacco use. Limiting alcohol intake. Taking medicine to slow the process of bone loss or to increase bone density. Taking daily supplements of calcium and vitamin D. Taking hormone replacement medicines, such as estrogen for women and testosterone for men. Monitoring your levels of calcium and vitamin D. The goal of treatment is to strengthen your bones and lower your risk for a fracture. Follow these instructions at home: Eating and drinking Include calcium and vitamin D in your diet. Calcium is important for bone health, and vitamin D helps your body absorb calcium. Good sources of calcium and  vitamin D include: Certain fatty fish, such as salmon and tuna. Products that have calcium and vitamin D added to them (are fortified), such as fortified cereals. Egg yolks. Cheese. Liver.  Activity Do exercises as told by your health care provider. Ask your health care provider what exercises and activities are safe for you. You should do: Exercises that make you work against gravity (weight-bearing exercises), such as tai chi, yoga, or walking. Exercises to strengthen muscles, such as lifting weights. Lifestyle Do not drink alcohol if: Your health care provider tells you not to drink. You are pregnant, may be pregnant, or are planning to become pregnant. If you drink alcohol: Limit how much you use to: 0-1 drink a day for women. 0-2 drinks a day for men. Know how much alcohol is in your drink. In the [place] one drink equals one 12 oz bottle of beer (355 mL), one 5 oz glass of wine (148  mL), or one 1 oz glass of hard liquor (44 mL). Do not use any products that contain nicotine or tobacco, such as cigarettes, e-cigarettes, and chewing tobacco. If you need help quitting, ask your health care provider. Preventing falls Use devices to help you move around (mobility aids) as needed, such as canes, walkers, scooters, or crutches. Keep rooms well-lit and clutter-free. Remove tripping hazards from walkways, including cords and throw rugs. Install grab bars in bathrooms and safety rails on stairs. Use rubber mats in the bathroom and other areas that are often wet or slippery. Wear closed-toe shoes that fit well and support your feet. Wear shoes that have rubber soles or low heels. Review your medicines with your health care provider. Some medicines can cause dizziness or changes in blood pressure, which can increase your risk of falling. General instructions Take over-the-counter and prescription medicines only as told by your health care provider. Keep all follow-up visits. This is  important. Contact a health care provider if: You have never been screened for osteoporosis and you are: A woman who is age 56 or older. A man who is age 45 or older. Get help right away if: You fall or injure yourself. Summary Osteoporosis is thinning and loss of density in your bones. This makes bones more brittle and fragile and more likely to break (fracture),even with minor falls. The goal of treatment is to strengthen your bones and lower your risk for a fracture. Include calcium and vitamin D in your diet. Calcium is important for bone health, and vitamin D helps your body absorb calcium. Talk with your health care provider about screening for osteoporosis if you are a woman who is age 88 or older, or a man who is age 40 or older. This information is not intended to replace advice given to you by your health care provider. Make sure you discuss any questions you have with your health care provider. Document Revised: 01/19/2020 Document Reviewed: 01/19/2020 Elsevier Patient Education  [place].

## 2021-05-22 ENCOUNTER — Other Ambulatory Visit: Payer: Self-pay | Admitting: Obstetrics and Gynecology

## 2021-05-22 DIAGNOSIS — M810 Age-related osteoporosis without current pathological fracture: Secondary | ICD-10-CM

## 2021-05-22 LAB — MAGNESIUM: Magnesium: 2.3 mg/dL (ref 1.5–2.5)

## 2021-05-22 LAB — PHOSPHORUS: Phosphorus: 4 mg/dL (ref 2.1–4.3)

## 2021-05-22 LAB — VITAMIN D 25 HYDROXY (VIT D DEFICIENCY, FRACTURES): Vit D, 25-Hydroxy: 57 ng/mL (ref 30–100)

## 2021-05-22 MED ORDER — ALENDRONATE SODIUM 70 MG PO TABS: 70.0000 mg | ORAL_TABLET | ORAL | 3 refills | Status: DC

## 2021-05-23 LAB — URINALYSIS, COMPLETE W/RFL CULTURE
Bilirubin Urine: NEGATIVE
Crystals: NONE SEEN /HPF
Glucose, UA: NEGATIVE
Hgb urine dipstick: NEGATIVE
Ketones, ur: NEGATIVE
Nitrites, Initial: NEGATIVE
Protein, ur: NEGATIVE
RBC / HPF: NONE SEEN /HPF (ref 0–2)
Specific Gravity, Urine: 1.02 (ref 1.001–1.035)
Yeast: NONE SEEN /HPF
pH: 7.5 (ref 5.0–8.0)

## 2021-05-23 LAB — URINE CULTURE
MICRO NUMBER:: 12457817
SPECIMEN QUALITY:: ADEQUATE

## 2021-05-23 LAB — CULTURE INDICATED

## 2021-06-06 ENCOUNTER — Other Ambulatory Visit: Payer: Self-pay | Admitting: Family Medicine

## 2021-06-06 DIAGNOSIS — F341 Dysthymic disorder: Secondary | ICD-10-CM

## 2021-06-12 NOTE — Telephone Encounter (Signed)
Patient scheduled 07/23/2021

## 2021-06-24 ENCOUNTER — Other Ambulatory Visit: Payer: Self-pay | Admitting: Family Medicine

## 2021-06-24 DIAGNOSIS — R69 Illness, unspecified: Secondary | ICD-10-CM | POA: Diagnosis not present

## 2021-06-24 DIAGNOSIS — J4520 Mild intermittent asthma, uncomplicated: Secondary | ICD-10-CM

## 2021-07-15 DIAGNOSIS — H04123 Dry eye syndrome of bilateral lacrimal glands: Secondary | ICD-10-CM | POA: Diagnosis not present

## 2021-07-15 DIAGNOSIS — R519 Headache, unspecified: Secondary | ICD-10-CM | POA: Diagnosis not present

## 2021-07-15 DIAGNOSIS — Z961 Presence of intraocular lens: Secondary | ICD-10-CM | POA: Diagnosis not present

## 2021-07-15 DIAGNOSIS — H40023 Open angle with borderline findings, high risk, bilateral: Secondary | ICD-10-CM | POA: Diagnosis not present

## 2021-07-15 DIAGNOSIS — H35372 Puckering of macula, left eye: Secondary | ICD-10-CM | POA: Diagnosis not present

## 2021-07-15 DIAGNOSIS — H26493 Other secondary cataract, bilateral: Secondary | ICD-10-CM | POA: Diagnosis not present

## 2021-07-15 DIAGNOSIS — H0102A Squamous blepharitis right eye, upper and lower eyelids: Secondary | ICD-10-CM | POA: Diagnosis not present

## 2021-07-15 DIAGNOSIS — H0102B Squamous blepharitis left eye, upper and lower eyelids: Secondary | ICD-10-CM | POA: Diagnosis not present

## 2021-07-17 ENCOUNTER — Ambulatory Visit: Payer: Medicare HMO

## 2021-07-22 NOTE — Progress Notes (Deleted)
Megan [name] New Patient Evaluation and Consultation  Referring Provider: Salvadore Dom, MD PCP: Megan Salter, MD Date of Service: 07/23/2021  SUBJECTIVE Chief Complaint: No chief complaint on file.  History of Present Illness: Megan [name] is a 70 y.o. {ED SANE DTOI:71245} female seen in consultation at the request of Dr. Talbert Nan for evaluation of bladder urgency.    Review of records from Dr Talbert Nan significant for: She is s/p robotic-assisted laparoscopic supracervical hysterectomy/BSO/sacrocolpopexy in 3/22 with Urology. She was admitted post op with bladder injury, urinary retention, sepsis. A catheter was placed, she had for 2-3 weeks.  Urinary Symptoms: {urine leakage?:24754} Leaks *** time(s) per {days/wks/mos/yrs:310907}.  Pad use: {NUMBERS 1-10:18281} {pad option:24752} per day.   She {ACTION; IS/IS YKD:[id number]} bothered by her UI symptoms.  Day time voids ***.  Nocturia: *** times per night to void. Voiding dysfunction: she {empties:24755} her bladder well.  {DOES NOT does:27190::"does not"} use a catheter to empty bladder.  When urinating, she feels {urine symptoms:24756} Drinks: *** per day  UTIs: {NUMBERS 1-10:18281} UTI's in the last year.   {ACTIONS;DENIES/REPORTS:21021675::"Denies"} history of {urologic concerns:24757}  Pelvic Organ Prolapse Symptoms:                  She {denies/ admits to:24761} a feeling of a bulge the vaginal area. It has been present for {NUMBER 1-10:22536} {days/wks/mos/yrs:310907}.  She {denies/ admits to:24761} seeing a bulge.  This bulge {ACTION; IS/IS NLZ:76734193} bothersome.  Bowel Symptom: Bowel movements: *** time(s) per {Time; day/week/month:13537} Stool consistency: {stool consistency:24758} Straining: {yes/no:19897}.  Splinting: {yes/no:19897}.  Incomplete evacuation: {yes/no:19897}.  She {denies/ admits to:24761} accidental bowel leakage / fecal incontinence  Occurs: *** time(s) per {Time;  day/week/month:13537}  Consistency with leakage: {stool consistency:24758} Bowel regimen: {bowel regimen:24759} Last colonoscopy: Date ***, Results ***  Sexual Function Sexually active: {yes/no:19897}.  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 difficile diarrhea    Cancer (Megan [name])    pt denies    Chest pain, atypical 07/14/2011   Cold sore 07/17/2016   Colon polyp    Contact dermatitis 03/23/2012   Cystocele, midline    Depression    Diarrhea    Diverticulosis    Endometrial hyperplasia 02/27/2006   BENIGN ENDO BX ON 02/2007   GERD (gastroesophageal reflux disease)    Headache 04/16/2015   Headache    Headache(784.0) 04/15/2012   pt denies    Heart murmur    Hematuria 04/27/2012   Hepatic cyst    Hiatal hernia    Hiatal hernia    Hyperlipidemia, mixed 12/17/2014   Hypertension    IBS (irritable bowel syndrome)    Insomnia    Leaky heart valve    Low back pain 08/15/2013   Osteoporosis 03/2017   T score -2.5. 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   Sinusitis  acute 02/03/2012   Thrush 07/21/2011   Tricuspid regurgitation 07/21/2011   Unspecified hypothyroidism    Unspecified menopausal and postmenopausal disorder    Uterine prolapse without mention of vaginal wall prolapse    Vitamin B12 deficiency    Vitamin D deficiency 07/17/2016      Past Surgical History:   Past Surgical History:  Procedure Laterality Date   APPENDECTOMY     endoscopic hemoclip     eye surgery Left    lens implant   HYSTEROSCOPY     POLYP   OPEN REDUCTION INTERNAL FIXATION (ORIF) DISTAL RADIAL FRACTURE Left 01/17/2019   Procedure: LEFT OPEN REDUCTION INTERNAL FIXATION (ORIF) DISTAL RADIUS FRACTURE;  Surgeon: Daryll Brod, MD;  Location: [facility];  Service: Orthopedics;  Laterality: Left;  AXILLARY BLOCK   PERIPHERAL VASCULAR CATHETERIZATION Left 07/24/2016   Procedure: Renal Angiography;  Surgeon: Conrad Allendale, MD;  Location: [facility];  Service: Cardiovascular;  Laterality: Left;   ROBOTIC ASSISTED BILATERAL SALPINGO OOPHERECTOMY  11/15/2020   ROBOTIC ASSISTED LAPAROSCOPIC SACROCOLPOPEXY Bilateral 11/15/2020   Procedure: XI ROBOTIC ASSISTED LAPAROSCOPIC SACROCOLPOPEXY AND SUPRACERVICIAL HYSTERECTOMY BILATERAL SALPINGO-OOPHERECTOMY;  Surgeon: Ardis Hughs, MD;  Location: [facility];  Service: Urology;  Laterality: Bilateral;  REQUESTING 4.5 HRS   ROBOTIC ASSISTED SUPRACERVICAL HYSTERECTOMY  11/15/2020   UPPER GI ENDOSCOPY     UTERINE FIBROID SURGERY       Past OB/GYN History: G{NUMBERS 1-10:18281} P{NUMBERS 1-10:18281} Vaginal deliveries: ***,  Forceps/ Vacuum deliveries: ***, Cesarean section: *** Menopausal: {menopausal:24763} Contraception: ***. Last pap smear was ***.  Any history of abnormal pap smears: {yes/no:19897}.   Medications: She has a current medication list which includes the following prescription(s): albuterol, alendronate, alprazolam, atorvastatin, dicyclomine, diltiazem, duloxetine, gabapentin, linaclotide, metoprolol succinate, omeprazole, ondansetron, polyethylene glycol, promethazine, and sucralfate.   Allergies: Patient is allergic to sulfamethoxazole, amitriptyline, effexor [venlafaxine], lactose intolerance (gi), lexapro [escitalopram oxalate], and prednisone.   Social History:  Social  History   Tobacco Use   Smoking status: Former    Types: Cigarettes    Quit date: 08/19/1995    Years since quitting: 25.9   Smokeless tobacco: Never  Vaping Use   Vaping Use: Never used  Substance Use Topics   Alcohol use: Never    Alcohol/week: 0.0 standard drinks   Drug use: Never    Relationship status: {relationship status:24764} She lives with ***.   She {ACTION; IS/IS WLS:93734287} employed ***. Regular exercise: {Yes/No:304960894} History of abuse: {Yes/No:304960894}  Family History:   Family History  Problem Relation Age of Onset   Colon cancer Mother    Diabetes Mother    Hypertension Mother    Colon polyps Father    Parkinson's disease Father    Colon cancer Maternal Grandmother    Breast cancer Maternal Grandmother 36   Diabetes Paternal Grandmother    Breast cancer Paternal Grandmother 35   Allergies Sister    Arthritis Brother        b/l hip replace   Alcohol abuse Daughter    Arthritis Son    Hypertension Son    Allergies Son    Osteoporosis Son    Osteoporosis Son    Arthritis Son        back disease, DDD   Allergies Son    Arthritis Son    Irritable bowel syndrome Son        RSD   Hypertension Paternal Grandfather    Heart disease Paternal Grandfather    Aneurysm Paternal Grandfather  Colon cancer Maternal Aunt    Colon cancer Cousin        X3   Ovarian cancer Cousin      Review of Systems: ROS   OBJECTIVE Physical Exam: There were no vitals filed for this visit.  Physical Exam   GU / Detailed Urogynecologic Evaluation:  Pelvic Exam: Normal external female genitalia; Bartholin's and Skene's glands normal in appearance; urethral meatus normal in appearance, no urethral masses or discharge.   CST: {gen negative/positive:315881}  Reflexes: bulbocavernosis {DESC; PRESENT/NOT PRESENT:21021351}, anocutaneous {DESC; PRESENT/NOT PRESENT:21021351} ***bilaterally.  Speculum exam reveals normal vaginal mucosa {With/Without:20273}  atrophy. Cervix {exam; gyn cervix:30847}. Uterus {exam; pelvic uterus:30849}. Adnexa {exam; adnexa:12223}.    s/p hysterectomy: Speculum exam reveals normal vaginal mucosa {With/Without:20273}  atrophy and normal vaginal cuff.  Adnexa {exam; adnexa:12223}.    With apex supported, anterior compartment defect was {reduced:24765}  Pelvic floor strength {Roman # I-V:19040}/V, puborectalis {Roman # I-V:19040}/V external anal sphincter {Roman # I-V:19040}/V  Pelvic floor musculature: Right levator {Tender/Non-tender:20250}, Right obturator {Tender/Non-tender:20250}, Left levator {Tender/Non-tender:20250}, Left obturator {Tender/Non-tender:20250}  POP-Q:   POP-Q                                               Aa                                               Ba                                                 C                                                Gh                                               Pb                                               tvl                                                Ap                                               Bp  D     Rectal Exam:  Normal sphincter tone, {rectocele:24766} distal rectocele, enterocoele {DESC; PRESENT/NOT PRESENT:21021351}, no rectal masses, {sign of:24767} dyssynergia when asking the patient to bear down.  Post-Void Residual (PVR) by Bladder Scan: In order to evaluate bladder emptying, we discussed obtaining a postvoid residual and she agreed to this procedure.  Procedure: The ultrasound unit was placed on the patient's abdomen in the suprapubic region after the patient had voided. A PVR of *** ml was obtained by bladder scan.  Laboratory Results: @ENCLABS @   ***I visualized the urine specimen, noting the specimen to be {urine color:24768}  ASSESSMENT AND PLAN Ms. Leisner is a 70 y.o. with: No diagnosis found.    Jaquita Folds, MD   Medical Decision  Making:  - Reviewed/ ordered a clinical laboratory test - Reviewed/ ordered a radiologic study - Reviewed/ ordered medicine test - Decision to obtain old records - Discussion of management of or test interpretation with an external physician / other healthcare professional  - Assessment requiring independent historian - Review and summation of prior records - Independent review of image, tracing or specimen

## 2021-07-23 ENCOUNTER — Ambulatory Visit: Payer: Medicare HMO | Admitting: Obstetrics and Gynecology

## 2021-07-26 ENCOUNTER — Other Ambulatory Visit: Payer: Self-pay

## 2021-07-26 ENCOUNTER — Ambulatory Visit (INDEPENDENT_AMBULATORY_CARE_PROVIDER_SITE_OTHER): Payer: Medicare HMO | Admitting: Family Medicine

## 2021-07-26 VITALS — BP 130/72 | HR 70 | Temp 97.1°F | Ht 64.75 in | Wt 135.6 lb

## 2021-07-26 DIAGNOSIS — G8929 Other chronic pain: Secondary | ICD-10-CM | POA: Diagnosis not present

## 2021-07-26 DIAGNOSIS — K2950 Unspecified chronic gastritis without bleeding: Secondary | ICD-10-CM

## 2021-07-26 DIAGNOSIS — F418 Other specified anxiety disorders: Secondary | ICD-10-CM

## 2021-07-26 DIAGNOSIS — I10 Essential (primary) hypertension: Secondary | ICD-10-CM | POA: Diagnosis not present

## 2021-07-26 DIAGNOSIS — R519 Headache, unspecified: Secondary | ICD-10-CM | POA: Diagnosis not present

## 2021-07-26 DIAGNOSIS — K581 Irritable bowel syndrome with constipation: Secondary | ICD-10-CM

## 2021-07-26 DIAGNOSIS — E782 Mixed hyperlipidemia: Secondary | ICD-10-CM

## 2021-07-26 NOTE — Progress Notes (Signed)
[facility] [address] [place] [address] Dept: [phone] Dept Fax: [phone]  Chronic Care Office Visit  Subjective:    Patient ID: Megan [name], female    DOB: Jul 26, 1951, 70 y.o..   MRN: [id number]  Chief Complaint  Patient presents with   Follow-up    F/u meds.  No new concerns.      History of Present Illness:  Patient is in today for reassessment of chronic medical issues.  Ms. Biskup has  a history of IBS with both diarrhea and constipation. She has been seeing Dr. Jerene Pitch (gastroenterology) who performed an EGD, finding diffuse gastritis. She is currently managed on Prilosec, Gaviscon, Mylanta, Pepcid AC and Carafate. Despite all of this, she continue to complain of some stomach upset and cramping. She also finds that she has to use her Linzess at least once a week due to constipation. She had thought Dr. Jerene Pitch had planned a colonoscopy, but has not heard form him about scheduling for this.  Ms. Whitener has a history of chronic left-sided headaches.  She was tried on gabapentin, without improvements. She is scheduled back with the urologist for evaluation.  Ms. Kuhnle has a history of hypertension. She is managed on diltiazem and metoprolol. She also takes a daily aspirin.   Ms. Wilbourn has aortic atherosclerosis and hyperlipidemia. She is managed on atorvastatin.  Ms. Carducci underwent robotic hysterectomy, bilateral salpingo-oophorectomy and sacrocolpopexy on November 15, 2020. Her surgery was complicated by a ruptured bladder. She is having ongoing urinary urgency. She recently saw Dr. Talbert Nan (GYN) and is going to be evaluated by a urogynecologist to see what more can be done for her urinary urgency issues.  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   Closed fracture of right distal radius 01/12/2019   Moderate obstructive sleep apnea 05/21/2017   Vitamin D deficiency 07/17/2016   Abdominal aortic atherosclerosis ([place]) 07/16/2016   Renal artery stenosis ([place]) 06/12/2016   Chronic low back pain 08/24/2015   Hyperlipidemia, mixed 12/17/2014   Hepatic cyst 05/22/2014   Insomnia due to other mental disorder 03/10/2014   Pancreatic cyst 04/19/2013   Osteoporosis 09/07/2012   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   Tricuspid regurgitation, Mild-Moderate 07/21/2011   Mitral regurgitation, Trivial 07/21/2011   Gastritis 06/17/2011   Endometrial hyperplasia    Diverticulosis    IBS (irritable bowel syndrome) with constipation 04/24/2011   Hiatal hernia 03/04/2011   Basal cell carcinoma (BCC) of face 11/15/2009   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   Essential hypertension 01/04/2009   Past Surgical History:  Procedure Laterality Date   APPENDECTOMY     endoscopic hemoclip     eye surgery Left    lens implant   HYSTEROSCOPY     POLYP   OPEN REDUCTION INTERNAL FIXATION (ORIF) DISTAL RADIAL FRACTURE Left 01/17/2019   Procedure: LEFT OPEN REDUCTION INTERNAL FIXATION (ORIF) DISTAL RADIUS FRACTURE;  Surgeon: Daryll Brod, MD;  Location: [facility];  Service: Orthopedics;  Laterality: Left;  AXILLARY BLOCK   PERIPHERAL VASCULAR CATHETERIZATION Left 07/24/2016   Procedure: Renal Angiography;  Surgeon: Conrad New Llano, MD;  Location: [facility];  Service: Cardiovascular;  Laterality: Left;   ROBOTIC ASSISTED  BILATERAL SALPINGO OOPHERECTOMY  11/15/2020   ROBOTIC ASSISTED LAPAROSCOPIC SACROCOLPOPEXY Bilateral 11/15/2020   Procedure: XI ROBOTIC ASSISTED LAPAROSCOPIC SACROCOLPOPEXY AND SUPRACERVICIAL HYSTERECTOMY BILATERAL SALPINGO-OOPHERECTOMY;  Surgeon: Ardis Hughs, MD;  Location: [facility];  Service: Urology;  Laterality: Bilateral;  REQUESTING 4.5 HRS   ROBOTIC ASSISTED SUPRACERVICAL HYSTERECTOMY  11/15/2020   UPPER GI ENDOSCOPY     UTERINE FIBROID SURGERY     Family History  Problem Relation Age of Onset   Colon cancer Mother    Diabetes Mother    Hypertension Mother    Colon polyps Father    Parkinson's disease Father    Colon cancer Maternal Grandmother    Breast cancer Maternal Grandmother 76   Diabetes Paternal Grandmother    Breast cancer Paternal Grandmother 38   Allergies Sister    Arthritis Brother        b/l hip replace   Alcohol abuse Daughter    Arthritis Son    Hypertension Son    Allergies Son    Osteoporosis Son    Osteoporosis Son    Arthritis Son        back disease, DDD   Allergies Son    Arthritis Son    Irritable bowel syndrome Son        RSD   Hypertension Paternal Grandfather    Heart disease Paternal Grandfather    Aneurysm Paternal Grandfather    Colon cancer Maternal Aunt    Colon cancer Cousin        X3   Ovarian cancer Cousin    Outpatient Medications Prior to Visit  Medication Sig Dispense Refill   albuterol (VENTOLIN HFA) 108 (90 Base) MCG/ACT inhaler TAKE 2 PUFFS BY MOUTH EVERY 6 HOURS AS NEEDED FOR WHEEZE OR SHORTNESS OF BREATH 8.5 each 1   ALPRAZolam (XANAX) 1 MG tablet TAKE 1 TABLET BY MOUTH 2 TIMES DAILY AS NEEDED. 30 tablet 2   Alum & Mag Hydroxide-Simeth (MYLANTA PO) Take by mouth. PRN     Alum Hydroxide-Mag Carbonate (GAVISCON PO) Take by mouth. PRN     aspirin EC 81 MG tablet Take 81 mg by mouth daily. Swallow whole.     atorvastatin (LIPITOR) 20 MG tablet      Biotin 10 MG TABS Take by mouth.     cholecalciferol (VITAMIN D3) 25  MCG (1000 UNIT) tablet Take 5,000 Units by mouth daily.     dicyclomine (BENTYL) 10 MG capsule      diltiazem (CARDIZEM CD) 180 MG 24 hr capsule TAKE 1 CAPSULE BY MOUTH EVERY DAY 90 capsule 3   DULoxetine (CYMBALTA) 30 MG capsule TAKE 1 CAPSULE BY MOUTH EVERY DAY 90 capsule 2   famotidine-calcium carbonate-magnesium hydroxide (PEPCID COMPLETE) 10-800-165 MG chewable tablet Chew 1 tablet by mouth daily as needed.     gabapentin (NEURONTIN) 300 MG capsule Take 1 capsule (300 mg total) by mouth 3 (three) times daily. 90 capsule 5   linaclotide (LINZESS) 72 MCG capsule Take 1 capsule (72 mcg total) by mouth daily before breakfast. 30 capsule 2   metoprolol succinate (TOPROL-XL) 50 MG 24 hr tablet Take 1 tablet (50 mg total) by mouth daily. Take with or immediately following a meal. 90 tablet 3   Multiple Vitamin (MULTIVITAMIN) tablet  Take 1 tablet by mouth daily.     omeprazole (PRILOSEC) 40 MG capsule Take 40 mg by mouth in the morning and at bedtime.     ondansetron (ZOFRAN) 4 MG tablet Take 1 tablet (4 mg total) by mouth every 8 (eight) hours as needed for nausea or vomiting. 20 tablet 6   polyethylene glycol (MIRALAX / GLYCOLAX) 17 g packet Take 17 g by mouth daily. 14 each 0   promethazine (PHENERGAN) 25 MG tablet Take 25 mg by mouth every 6 (six) hours as needed for nausea or vomiting.     sucralfate (CARAFATE) 1 g tablet TAKE 1 TABLET BY MOUTH FOUR TIMES A DAY AS NEEDED     alendronate (FOSAMAX) 70 MG tablet Take 1 tablet (70 mg total) by mouth every 7 (seven) days. Take first thing in am with 8 oz. Water.  Be upright after taking for 2 hours.  Eat nothing for one hour. 12 tablet 3   No facility-administered medications prior to visit.   Allergies  Allergen Reactions   Sulfamethoxazole Shortness Of Breath    chest tightness   Amitriptyline Anxiety and Other (See Comments)    Depression, insomnia   Lactose Intolerance (Gi) Diarrhea and Nausea And Vomiting   Lexapro [Escitalopram Oxalate]      nausea   Prednisone Palpitations     Objective:   Today's Vitals   07/26/21 0951  BP: 130/72  Pulse: 70  Temp: (!) 97.1 F (36.2 C)  TempSrc: Temporal  SpO2: 99%  Weight: 135 lb 9.6 oz (61.5 kg)  Height: 5' 4.75" (1.645 m)   Body mass index is 22.74 kg/m.   General: Well developed, well nourished. No acute distress. Psych: Alert and oriented. Normal mood and affect.  Health Maintenance Due  Topic Date Due   COVID-19 Vaccine (1) Never done   Pneumonia Vaccine 2+ Years old (1 - PCV) Never done   Hepatitis C Screening  Never done   Zoster Vaccines- Shingrix (1 of 2) Never done   Depression screen [facility] 2/9 07/26/2021 02/05/2021 02/05/2021  Decreased Interest 1 0 0  Down, Depressed, Hopeless 1 2 2   PHQ - 2 Score 2 2 2   Altered sleeping 1 1 -  Tired, decreased energy 0 1 -  Change in appetite 0 0 -  Feeling bad or failure about yourself  0 0 -  Trouble concentrating 0 0 -  Moving slowly or fidgety/restless 0 0 -  Suicidal thoughts 0 0 -  PHQ-9 Score 3 4 -  Difficult doing work/chores Not difficult at all Not difficult at all -  Some recent data might be hidden   GAD 7 : Generalized Anxiety Score 07/26/2021 02/05/2021 05/12/2017  Nervous, Anxious, on Edge 1 1 2   Control/stop worrying 1 0 3  Worry too much - different things 1 1 3   Trouble relaxing 0 1 2  Restless 0 0 2  Easily annoyed or irritable 0 0 2  Afraid - awful might happen 0 0 3  Total GAD 7 Score 3 3 17   Anxiety Difficulty Not difficult at all Not difficult at all Very difficult   Assessment & Plan:   1. Essential hypertension Blood pressure is at goal Continue diltiazem and metoprolol.  2. Irritable bowel syndrome with constipation Stable on dicyclomine and linaclotide. Continuing to follow with Dr. Jerene Pitch. I recommended she reach back out to him concerning scheduling for her colonoscopy.  3. Other chronic gastritis without hemorrhage Reviewed the EGD results with Ms. Freda, which  had shown  gastritis. She will continue her regimen pf Prilosec, Gaviscon, Mylanta, Pepcid AC and Carafate.  4. Chronic left-sided headache Continue to follow with neurology.  5. Depression with anxiety PHQ and GAD7 results are remain improve dnad her mood seems much better, despite the health issues. Continue duloxetine and Xanax.  6. Hyperlipidemia, mixed Due for follow-up lipids. Continue atorvastatin.  - Lipid panel; Future  Haydee Salter, MD

## 2021-07-29 ENCOUNTER — Other Ambulatory Visit: Payer: Self-pay

## 2021-07-29 ENCOUNTER — Other Ambulatory Visit (INDEPENDENT_AMBULATORY_CARE_PROVIDER_SITE_OTHER): Payer: Medicare HMO

## 2021-07-29 DIAGNOSIS — E782 Mixed hyperlipidemia: Secondary | ICD-10-CM | POA: Diagnosis not present

## 2021-07-29 LAB — LIPID PANEL
Cholesterol: 145 mg/dL (ref 0–200)
HDL: 55.6 mg/dL (ref >39.00)
LDL Cholesterol: 72 mg/dL (ref 0–99)
NonHDL: 89.47
Total CHOL/HDL Ratio: 3
Triglycerides: 87 mg/dL (ref 0.0–149.0)
VLDL: 17.4 mg/dL (ref 0.0–40.0)

## 2021-07-29 NOTE — Progress Notes (Signed)
Per the orders of Dr. Gena Fray pt is here for labs, pt tolerated draw well.

## 2021-08-02 ENCOUNTER — Other Ambulatory Visit: Payer: Self-pay | Admitting: Neurology

## 2021-08-05 NOTE — Telephone Encounter (Signed)
Rx refilled.

## 2021-08-07 NOTE — Progress Notes (Signed)
Chief Complaint  Patient presents with   Follow-up    Rm 12. Alone. Reports left sided head pain that has been worsening over the past few months. States medications do not seem to be helpful. Pt reports accidentally running into a cabinet and hitting the left side of her head. Followed up with PCP after accident. Reports left sided "vessel" in neck is painful. Pt c/o worsening blurry vision over the last two to three months.   HISTORICAL Megan [name] is a 70 years old female,  seen in request by her primary care physician PA Waldon Merl, [place] and ophthalmologist Dr. Sallye Lat for evaluation of headaches, initial evaluation was on April 07, 2018.   She has past medical history of depression anxiety, had intolerance to multiple different agents in the past, including amitriptyline, Effexor, but she denied previous history of headaches, early 2019, she began to have frequent left retro-orbital area pressure headaches, initially thought it was due to sinus issues, was evaluated by ENT Dr. Lazarus Salines, reported normal CT sinus in 2019.   She had left cataract surgery by Dr. Dione Booze in April 2019, left vision overall recovered very well, but since her cataract surgery in April, left-sided headache has become worse, constant pressure, 5 out of 10 on a daily basis, often exacerbated to a more severe pounding headache with associated light noise sensitivity, nauseous, she tends to lying in bed for hours to help her headaches, she has tried over-the-counter ibuprofen and Tylenol with limited help,   We personally reviewed MRI of the brain in May 2017 for evaluation of acute onset right facial droop and numbness, there was no acute abnormality mild supratentorium small vessel disease,    ESR C-reactive protein were normal in April 2019.  UPDATE Aug 09 2020: She lost follow-up since 2019, for left-sided headache quit for about 2 years, began to have frequent headaches again few months ago,  early December 2021, she had 3 days of headache on the left parietal region, on December 7, when she woke up, her headache has much improved, but she felt weak, tired, confused, she brushed her teeth at her kitchen sink, eventually presented to the emergency room,  Personally reviewed CT head without contrast, lacunar infarction at the left head of caudate, which was also present at MRI of the brain in 2019, also supratentorium small vessel disease, she has been taking aspirin 81 mg daily  She complains stress at home, difficulty sleeping, stress, tearful,  Laboratory evaluations in December 2021, normal CMP, CBC, hemoglobin of 14.3.  Update February 07, 2021 SS: Last seen Dec 2021, started Effexor-XR 37.5 mg, gabapentin 100 mg TID for migraine prevention, Fioricet PRN. CRP, sed rate, ANA were normal. Claims couldn't take Effexor, tried for 3 weeks, felt nauseated, palpations. Taking gabapentin 200 mg 2-3 times a day, does help. Reports daily ache to left parietal area, on good days can be dull, bad days is throbbing. When turns head to right sounds to her like click to left parietal area, not audible to anyone else. With headaches has nausea. At times is overwhelming to her, feels like bad earache but in her head. Has IBS, colitis. Remains on aspirin 81 mg daily. Ice and heat help the parietal area. Claims doesn't do well with antidepressants. Admits to anxiety, is the go getter in her family. Didn't take Fioricet.   Update August 08, 2021 SS: headaches still on left temple/parietal area, everyday, always dull but sometimes sharp pains throbbing. Gets nauseated. 4/7  days are bad headaches. Taking gabapentin 100 mg 3 times daily, the 300 mg twice daily gave her diarrhea with colitis. Claims blurry vision to the left eye, always, blurry vision for last 2 months. The eye doctor has cleared her.  The worse the headache gets the worse it gets. Takes Tylenol without benefit. Cymbalta didn't agree with her,  restarted it again 2 weeks ago to try again, has taken edge off stress. Fioricet made her heart race. A lot of stress with her and her husband, she worries a lot about her family. On aspirin.  REVIEW OF SYSTEMS: Full 14 system review of systems   See HPI  ALLERGIES: Allergies  Allergen Reactions   Sulfamethoxazole Shortness Of Breath    chest tightness   Amitriptyline Anxiety and Other (See Comments)    Depression, insomnia   Lactose Intolerance (Gi) Diarrhea and Nausea And Vomiting   Lexapro [Escitalopram Oxalate]     nausea   Prednisone Palpitations    HOME MEDICATIONS: Current Outpatient Medications  Medication Sig Dispense Refill   albuterol (VENTOLIN HFA) 108 (90 Base) MCG/ACT inhaler TAKE 2 PUFFS BY MOUTH EVERY 6 HOURS AS NEEDED FOR WHEEZE OR SHORTNESS OF BREATH 8.5 each 1   ALPRAZolam (XANAX) 1 MG tablet TAKE 1 TABLET BY MOUTH 2 TIMES DAILY AS NEEDED. 30 tablet 2   Alum & Mag Hydroxide-Simeth (MYLANTA PO) Take by mouth. PRN     Alum Hydroxide-Mag Carbonate (GAVISCON PO) Take by mouth. PRN     aspirin EC 81 MG tablet Take 81 mg by mouth daily. Swallow whole.     atorvastatin (LIPITOR) 20 MG tablet      Biotin 10 MG TABS Take by mouth.     cholecalciferol (VITAMIN D3) 25 MCG (1000 UNIT) tablet Take 5,000 Units by mouth daily.     dicyclomine (BENTYL) 10 MG capsule      diltiazem (CARDIZEM CD) 180 MG 24 hr capsule TAKE 1 CAPSULE BY MOUTH EVERY DAY 90 capsule 3   famotidine-calcium carbonate-magnesium hydroxide (PEPCID COMPLETE) 10-800-165 MG chewable tablet Chew 1 tablet by mouth daily as needed.     gabapentin (NEURONTIN) 300 MG capsule TAKE 1 CAPSULE BY MOUTH THREE TIMES A DAY (Patient taking differently: 100 mg 3 (three) times daily.) 90 capsule 5   linaclotide (LINZESS) 72 MCG capsule Take 1 capsule (72 mcg total) by mouth daily before breakfast. 30 capsule 2   Multiple Vitamin (MULTIVITAMIN) tablet Take 1 tablet by mouth daily.     omeprazole (PRILOSEC) 40 MG capsule Take  40 mg by mouth in the morning and at bedtime.     ondansetron (ZOFRAN) 4 MG tablet Take 1 tablet (4 mg total) by mouth every 8 (eight) hours as needed for nausea or vomiting. 20 tablet 6   polyethylene glycol (MIRALAX / GLYCOLAX) 17 g packet Take 17 g by mouth daily. 14 each 0   promethazine (PHENERGAN) 25 MG tablet Take 25 mg by mouth every 6 (six) hours as needed for nausea or vomiting.     Rimegepant Sulfate (NURTEC) 75 MG TBDP Take 75 mg by mouth as needed (Take 1 tablet at onset of headache, max is 75 mg in 24 hours). 10 tablet 11   sucralfate (CARAFATE) 1 g tablet TAKE 1 TABLET BY MOUTH FOUR TIMES A DAY AS NEEDED     topiramate (TOPAMAX) 25 MG tablet Take 1 tablet (25 mg total) by mouth 2 (two) times daily. Start taking 1 tablet at bedtime for 3 nights, then take 2  tablets at bedtime x 3 nights, then take 3 tablet at bedtime, stay on this dose 120 tablet 5   DULoxetine (CYMBALTA) 30 MG capsule TAKE 1 CAPSULE BY MOUTH EVERY DAY 30 capsule 5   metoprolol succinate (TOPROL-XL) 50 MG 24 hr tablet Take 1 tablet (50 mg total) by mouth daily. Take with or immediately following a meal. 90 tablet 3   No current facility-administered medications for this visit.    PAST MEDICAL HISTORY: Past Medical History:  Diagnosis Date   Adrenal adenoma 05/22/2014   Adrenal gland cyst ([place]) 05/22/2014   Allergic state 12/31/2012   Anxiety    Asthma    triggerred by fragrances    Barrett esophagus    Blind loop syndrome    C. difficile diarrhea    Cancer ([place])    pt denies    Chest pain, atypical 07/14/2011   Cold sore 07/17/2016   Colon polyp    Contact dermatitis 03/23/2012   Cystocele, midline    Depression    Diarrhea    Diverticulosis    Endometrial hyperplasia 02/27/2006   BENIGN ENDO BX ON 02/2007   GERD (gastroesophageal reflux disease)    Headache 04/16/2015   Headache    Headache(784.0) 04/15/2012   pt denies    Heart murmur    Hematuria 04/27/2012   Hepatic cyst    Hiatal hernia     Hiatal hernia    Hyperlipidemia, mixed 12/17/2014   Hypertension    IBS (irritable bowel syndrome)    Insomnia    Leaky heart valve    Low back pain 08/15/2013   Osteoporosis 03/2017   T score -2.5. 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   Sinusitis acute 02/03/2012   Thrush 07/21/2011   Tricuspid regurgitation 07/21/2011   Unspecified hypothyroidism    Unspecified menopausal and postmenopausal disorder    Uterine prolapse without mention of vaginal wall prolapse    Vitamin B12 deficiency    Vitamin D deficiency 07/17/2016    PAST SURGICAL HISTORY: Past Surgical History:  Procedure Laterality Date   APPENDECTOMY     endoscopic hemoclip     eye surgery Left    lens implant   HYSTEROSCOPY     POLYP   OPEN REDUCTION INTERNAL FIXATION (ORIF) DISTAL RADIAL FRACTURE Left 01/17/2019   Procedure: LEFT OPEN REDUCTION INTERNAL FIXATION (ORIF) DISTAL RADIUS FRACTURE;  Surgeon: Daryll Brod, MD;  Location: [facility];  Service: Orthopedics;  Laterality: Left;  AXILLARY BLOCK   PERIPHERAL VASCULAR CATHETERIZATION Left 07/24/2016   Procedure: Renal Angiography;  Surgeon: Conrad Loomis, MD;  Location: [facility];  Service: Cardiovascular;  Laterality: Left;   ROBOTIC ASSISTED BILATERAL SALPINGO OOPHERECTOMY  11/15/2020   ROBOTIC ASSISTED LAPAROSCOPIC SACROCOLPOPEXY Bilateral 11/15/2020   Procedure: XI ROBOTIC ASSISTED LAPAROSCOPIC SACROCOLPOPEXY AND SUPRACERVICIAL HYSTERECTOMY BILATERAL SALPINGO-OOPHERECTOMY;  Surgeon: Ardis Hughs, MD;  Location: [facility];  Service: Urology;  Laterality: Bilateral;  REQUESTING 4.5 HRS   ROBOTIC ASSISTED SUPRACERVICAL HYSTERECTOMY  11/15/2020   UPPER GI ENDOSCOPY     UTERINE FIBROID SURGERY      FAMILY HISTORY: Family History  Problem Relation Age  of Onset   Colon cancer Mother    Diabetes Mother    Hypertension Mother    Colon polyps Father  Parkinson's disease Father    Colon cancer Maternal Grandmother    Breast cancer Maternal Grandmother 26   Diabetes Paternal Grandmother    Breast cancer Paternal Grandmother 80   Allergies Sister    Arthritis Brother        b/l hip replace   Alcohol abuse Daughter    Arthritis Son    Hypertension Son    Allergies Son    Osteoporosis Son    Osteoporosis Son    Arthritis Son        back disease, DDD   Allergies Son    Arthritis Son    Irritable bowel syndrome Son        RSD   Hypertension Paternal Grandfather    Heart disease Paternal Grandfather    Aneurysm Paternal Grandfather    Colon cancer Maternal Aunt    Colon cancer Cousin        X3   Ovarian cancer Cousin     SOCIAL HISTORY: Social History   Socioeconomic History   Marital status: Married    Spouse name: Not on file   Number of children: 4   Years of education: 12   Highest education level: High school graduate  Occupational History   Occupation: Retired  Tobacco Use   Smoking status: Former    Types: Cigarettes    Quit date: 08/19/1995    Years since quitting: 25.9   Smokeless tobacco: Never  Vaping Use   Vaping Use: Never used  Substance and Sexual Activity   Alcohol use: Never    Alcohol/week: 0.0 standard drinks   Drug use: Never   Sexual activity: Not Currently    Birth control/protection: Post-menopausal    Comment: 1st intercourse 27 yo-5 partners  Other Topics Concern   Not on file  Social History Narrative   Lives at home with her husband.   Right-handed.   2 cups caffeine per day.   Social Determinants of Health   Financial Resource Strain: Not on file  Food Insecurity: Not on file  Transportation Needs: Not on file  Physical Activity: Not on file  Stress: Not on file  Social Connections: Not on file  Intimate Partner Violence: Not on file   PHYSICAL EXAM   Vitals:    08/08/21 0840  BP: (!) 161/84  Pulse: 73  Weight: 138 lb 8 oz (62.8 kg)  Height: 5' 4.75" (1.645 m)     Not recorded     Body mass index is 23.23 kg/m.  Physical Exam  General: The patient is alert and cooperative at the time of the examination.  Skin: No significant peripheral edema is noted.  Neurologic Exam  Mental status: The patient is alert and oriented x 3 at the time of the examination. The patient has apparent normal recent and remote memory, with an apparently normal attention span and concentration ability.  Cranial nerves: Facial symmetry is present. Speech is normal, no aphasia or dysarthria is noted. Extraocular movements are full. Visual fields are full.  Motor: The patient has good strength in all 4 extremities.  Sensory examination: Soft touch sensation is symmetric on the face, arms, and legs.  Coordination: The patient has good finger-nose-finger and heel-to-shin bilaterally.  Gait and station: The patient has a normal gait. Tandem gait is normal  Reflexes: Deep tendon reflexes are symmetric and normal.  DIAGNOSTIC DATA (LABS, IMAGING, TESTING) - I reviewed patient records, labs, notes, testing and imaging myself where available.  ASSESSMENT AND PLAN  Amie Portland  is a 70 y.o. female   Left-sided headaches 2.   Lacunar infarction 3.   Chronic depression  -Check MRI of the brain given report of new blurry vision within the last 2 months, headaches to the left side getting worse, history of lacunar infarction -Check ESR, CRP rule out temporal arteritis given new reported blurry vision, has been negative in the past -Add on Topamax working up to 75 mg at bedtime for headache prevention, does have migraine features with nausea -Continue Cymbalta 30 mg daily, a lot of anxiety, this is helpful -Continue gabapentin 100 mg 3 times daily, was unable to tolerate higher doses due to GI upset with colitis -Will try Nurtec for acute headache treatment,  Fioricet made her heart race, will avoid triptan due to history of lacunar infarction -Continue aspirin 81 mg daily, history of infarction -Call for any dose adjustment, we will follow-up in 4 months with Dr . Krista Blue or sooner if needed  Butler Denmark, Laqueta Jean, DNP  [facility] [address], [place], [place] [address] [phone]

## 2021-08-08 ENCOUNTER — Encounter: Payer: Self-pay | Admitting: Neurology

## 2021-08-08 ENCOUNTER — Ambulatory Visit: Payer: Medicare HMO | Admitting: Neurology

## 2021-08-08 ENCOUNTER — Other Ambulatory Visit: Payer: Self-pay | Admitting: *Deleted

## 2021-08-08 ENCOUNTER — Other Ambulatory Visit: Payer: Self-pay | Admitting: Neurology

## 2021-08-08 ENCOUNTER — Other Ambulatory Visit: Payer: Self-pay

## 2021-08-08 VITALS — BP 161/84 | HR 73 | Ht 64.75 in | Wt 138.5 lb

## 2021-08-08 DIAGNOSIS — I6381 Other cerebral infarction due to occlusion or stenosis of small artery: Secondary | ICD-10-CM | POA: Diagnosis not present

## 2021-08-08 DIAGNOSIS — R519 Headache, unspecified: Secondary | ICD-10-CM

## 2021-08-08 DIAGNOSIS — I679 Cerebrovascular disease, unspecified: Secondary | ICD-10-CM | POA: Diagnosis not present

## 2021-08-08 DIAGNOSIS — G8929 Other chronic pain: Secondary | ICD-10-CM

## 2021-08-08 MED ORDER — NURTEC 75 MG PO TBDP: 75.0000 mg | ORAL_TABLET | ORAL | 11 refills | Status: DC | PRN

## 2021-08-08 MED ORDER — DULOXETINE HCL 30 MG PO CPEP: ORAL_CAPSULE | ORAL | 5 refills | Status: DC

## 2021-08-08 MED ORDER — NURTEC 75 MG PO TBDP: 75.0000 mg | ORAL_TABLET | Freq: Every day | ORAL | 11 refills | Status: DC | PRN

## 2021-08-08 MED ORDER — TOPIRAMATE 25 MG PO TABS: ORAL_TABLET | ORAL | 5 refills | Status: DC

## 2021-08-08 MED ORDER — TOPIRAMATE 25 MG PO TABS: 25.0000 mg | ORAL_TABLET | Freq: Two times a day (BID) | ORAL | 5 refills | Status: DC

## 2021-08-08 NOTE — Patient Instructions (Addendum)
Check MRI of the brain  Add on Topamax starting with 25 mg at bedtime x 3 nights, then take 50 mg at bedtime x 3 nights, then take 75 mg at bedtime stay at this dose, this is for headache prevention  Continue Cymbalta for anxiety, also to help headache Will stay on the gabapentin for now Try Nurtec for acute headache, take 1 tablet at onset of headache, max is 75 mg in 24 hours Return in 4 months with see Dr. Krista Blue for headache

## 2021-08-08 NOTE — Progress Notes (Signed)
Chart reviewed, agree above plan ?

## 2021-08-09 LAB — SEDIMENTATION RATE: Sed Rate: 4 mm/hr (ref 0–40)

## 2021-08-09 LAB — C-REACTIVE PROTEIN: CRP: <1 mg/L (ref 0–10)

## 2021-08-13 ENCOUNTER — Telehealth: Payer: Self-pay | Admitting: *Deleted

## 2021-08-13 ENCOUNTER — Encounter: Payer: Self-pay | Admitting: *Deleted

## 2021-08-13 NOTE — Telephone Encounter (Signed)
Mychart message sent to the patient .

## 2021-08-13 NOTE — Telephone Encounter (Signed)
-----   Message from Suzzanne Cloud, NP sent at 08/13/2021  5:51 AM EST ----- Please let the patient know, blood work is normal.

## 2021-08-15 ENCOUNTER — Other Ambulatory Visit: Payer: Self-pay

## 2021-08-15 ENCOUNTER — Ambulatory Visit
Admission: RE | Admit: 2021-08-15 | Discharge: 2021-08-15 | Disposition: A | Discharge status: Home / Self Care | Payer: Medicare HMO | Source: Ambulatory Visit | Attending: Neurology | Admitting: Neurology

## 2021-08-15 DIAGNOSIS — I679 Cerebrovascular disease, unspecified: Secondary | ICD-10-CM

## 2021-08-15 DIAGNOSIS — G8929 Other chronic pain: Secondary | ICD-10-CM

## 2021-08-15 DIAGNOSIS — R519 Headache, unspecified: Secondary | ICD-10-CM | POA: Diagnosis not present

## 2021-08-18 ENCOUNTER — Other Ambulatory Visit: Payer: Self-pay | Admitting: Family Medicine

## 2021-08-18 DIAGNOSIS — J4520 Mild intermittent asthma, uncomplicated: Secondary | ICD-10-CM

## 2021-08-20 ENCOUNTER — Telehealth: Payer: Self-pay | Admitting: *Deleted

## 2021-08-20 NOTE — Telephone Encounter (Signed)
I called patient. Left a detailed message on home phone number, per DPR, with these MRI results. Asked her to call back with any questions or concerns.

## 2021-08-20 NOTE — Telephone Encounter (Signed)
-----   Message from Suzzanne Cloud, NP sent at 08/19/2021  8:50 PM EST ----- MRI of the brain does not show any acute findings or new stroke to explain headache. There is some slight progression of cortical atrophy. I hope the Topamax is helping her headache, please let me know if she has any questions. Dr. Krista Blue will review the film with her at her next office visit.

## 2021-08-29 ENCOUNTER — Other Ambulatory Visit: Payer: Self-pay | Admitting: Neurology

## 2021-08-30 ENCOUNTER — Ambulatory Visit: Payer: Medicare HMO | Admitting: Obstetrics and Gynecology

## 2021-09-03 ENCOUNTER — Other Ambulatory Visit: Payer: Self-pay | Admitting: Family Medicine

## 2021-09-03 DIAGNOSIS — F341 Dysthymic disorder: Secondary | ICD-10-CM

## 2021-10-07 NOTE — Progress Notes (Signed)
[facility] New Patient Evaluation and Consultation  Referring Provider: Salvadore Dom, MD PCP: Haydee Salter, MD Date of Service: 10/08/2021  SUBJECTIVE Chief Complaint: New Patient (Initial Visit) Megan [name] is a 71 y.o. female complains of past bladder problems.)  History of Present Illness: Megan [name] is a 71 y.o. White or Caucasian female seen in consultation at the request of Dr. Talbert Nan for evaluation of urinary urgency.    Review of records from Dr Talbert Nan significant for: S/p robotic supracervical hysterectomy, BSO, sacrocolpopexy in March 2022. Readmitted for bladder injury and sepsis. Does not feel she is voiding normally and has urgency to void.   Urinary Symptoms: Does not leak urine.   Day time voids 5-6.  Nocturia: 2 times per night to void. Urgency has improved recently.  Voiding dysfunction: she empties her bladder well.  does not use a catheter to empty bladder.  When urinating, she feels she has no difficulties Drinks: 1 cup coffee in AM, water, sweet tea with lemon   UTIs: 1 UTI's in the last year.   Denies history of blood in urine and kidney or bladder stones  Pelvic Organ Prolapse Symptoms:                  She Denies a feeling of a bulge the vaginal area.  S/p robotic supracervical hysterectomy, BSO, sacrocolpopexy in March 2022.  Bowel Symptom: Bowel movements: 7-8 days or more Has colitis and diverticulitis Stool consistency: hard Straining: yes.  Splinting: no.  Incomplete evacuation: yes.  She Denies accidental bowel leakage / fecal incontinence Bowel regimen: stool softener and miralax, Linzess, magnesium Last colonoscopy: Date- 2019  Sexual Function Sexually active: no.  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 difficile diarrhea    Cancer ([place])    pt denies    Chest pain, atypical 07/14/2011   Cold sore 07/17/2016   Colon polyp    Contact dermatitis 03/23/2012   Cystocele, midline    Depression    Diarrhea    Diverticulosis    Endometrial hyperplasia 02/27/2006   BENIGN ENDO BX ON 02/2007   GERD (gastroesophageal reflux disease)    Headache 04/16/2015   Headache    Headache(784.0) 04/15/2012   pt denies    Heart murmur    Hematuria 04/27/2012   Hepatic cyst    Hiatal hernia    Hiatal hernia    Hyperlipidemia, mixed 12/17/2014   Hypertension    IBS (irritable bowel syndrome)    Insomnia    Leaky heart valve    Low back pain 08/15/2013   Osteoporosis 03/2017   T score -2.5. 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   Sinusitis acute 02/03/2012   Thrush 07/21/2011   Tricuspid regurgitation 07/21/2011   Unspecified hypothyroidism    Unspecified menopausal and postmenopausal disorder    Uterine prolapse without mention of vaginal wall prolapse    Vitamin  B12 deficiency    Vitamin D deficiency 07/17/2016     Past Surgical History:   Past Surgical History:  Procedure Laterality Date   APPENDECTOMY     endoscopic hemoclip     eye surgery Left    lens implant   HYSTEROSCOPY     POLYP   OPEN REDUCTION INTERNAL FIXATION (ORIF) DISTAL RADIAL FRACTURE Left 01/17/2019   Procedure: LEFT OPEN REDUCTION INTERNAL FIXATION (ORIF) DISTAL RADIUS FRACTURE;  Surgeon: Daryll Brod, MD;  Location: [facility];  Service: Orthopedics;  Laterality: Left;  AXILLARY BLOCK   PERIPHERAL VASCULAR CATHETERIZATION Left 07/24/2016   Procedure: Renal Angiography;  Surgeon: Conrad Meyersdale, MD;   Location: [facility];  Service: Cardiovascular;  Laterality: Left;   ROBOTIC ASSISTED BILATERAL SALPINGO OOPHERECTOMY  11/15/2020   ROBOTIC ASSISTED LAPAROSCOPIC SACROCOLPOPEXY Bilateral 11/15/2020   Procedure: XI ROBOTIC ASSISTED LAPAROSCOPIC SACROCOLPOPEXY AND SUPRACERVICIAL HYSTERECTOMY BILATERAL SALPINGO-OOPHERECTOMY;  Surgeon: Ardis Hughs, MD;  Location: [facility];  Service: Urology;  Laterality: Bilateral;  REQUESTING 4.5 HRS   ROBOTIC ASSISTED SUPRACERVICAL HYSTERECTOMY  11/15/2020   UPPER GI ENDOSCOPY     UTERINE FIBROID SURGERY       Past OB/GYN History: OB History  Gravida Para Term Preterm AB Living  6 4     2 4   SAB IAB Ectopic Multiple Live Births          4    # Outcome Date GA Lbr Len/2nd Weight Sex Delivery Anes PTL Lv  6 AB           5 AB           4 Para           3 Para           2 Para           1 Para             Vaginal deliveries: 4 S/p supracervical hysterectomy Denies vaginal bleeding.    Medications: She has a current medication list which includes the following prescription(s): albuterol, alprazolam, alum & mag hydroxide-simeth, alum hydroxide-mag carbonate, aspirin ec, atorvastatin, biotin, cholecalciferol, dicyclomine, diltiazem, duloxetine, [START ON 10/10/2021] estradiol, famotidine-calcium carbonate-magnesium hydroxide, gabapentin, linaclotide, multivitamin, omeprazole, ondansetron, polyethylene glycol, promethazine, nurtec, sucralfate, and topiramate.   Allergies: Patient is allergic to sulfamethoxazole, amitriptyline, lactose intolerance (gi), lexapro [escitalopram oxalate], and prednisone.   Social History:  Social History   Tobacco Use   Smoking status: Former    Types: Cigarettes    Quit date: 08/19/1995    Years since quitting: 26.1   Smokeless tobacco: Never  Vaping Use   Vaping Use: Never used  Substance Use Topics   Alcohol use: Never    Alcohol/week: 0.0 standard drinks   Drug use: Never    Relationship status:  married She lives with her spouse.   She is not employed. Regular exercise: Yes: walking History of abuse: No  Family History:   Family History  Problem Relation Age of Onset   Colon cancer Mother    Diabetes Mother    Hypertension Mother    Colon polyps Father    Parkinson's disease Father    Colon cancer Maternal Grandmother    Breast cancer Maternal Grandmother 38   Diabetes Paternal Grandmother    Breast cancer Paternal Grandmother 40   Allergies Sister    Arthritis Brother        b/l hip replace   Alcohol abuse Daughter  Arthritis Son    Hypertension Son    Allergies Son    Osteoporosis Son    Osteoporosis Son    Arthritis Son        back disease, DDD   Allergies Son    Arthritis Son    Irritable bowel syndrome Son        RSD   Hypertension Paternal Grandfather    Heart disease Paternal Grandfather    Aneurysm Paternal Grandfather    Colon cancer Maternal Aunt    Colon cancer Cousin        X3   Ovarian cancer Cousin      Review of Systems: Review of Systems  Constitutional:  Positive for malaise/fatigue. Negative for fever and weight loss.  Respiratory:  Negative for cough, shortness of breath and wheezing.   Cardiovascular:  Negative for chest pain, palpitations and leg swelling.  Gastrointestinal:  Positive for abdominal pain. Negative for blood in stool.  Genitourinary:  Negative for dysuria.  Musculoskeletal:  Negative for myalgias.  Skin:  Negative for rash.  Neurological:  Positive for headaches. Negative for dizziness.  Endo/Heme/Allergies:  Bruises/bleeds easily.  Psychiatric/Behavioral:  Positive for depression. The patient is not nervous/anxious.     OBJECTIVE Physical Exam: Vitals:   10/08/21 1052  BP: (!) 147/75  Pulse: 79  Weight: 138 lb (62.6 kg)  Height: 5\' 6"  (1.676 m)    Physical Exam Constitutional:      General: She is not in acute distress. Pulmonary:     Effort: Pulmonary effort is normal.  Abdominal:     General:  There is no distension.     Palpations: Abdomen is soft.     Tenderness: There is no abdominal tenderness. There is no rebound.  Musculoskeletal:        General: No swelling. Normal range of motion.  Skin:    General: Skin is warm and dry.     Findings: No rash.  Neurological:     Mental Status: She is alert and oriented to person, place, and time.  Psychiatric:        Mood and Affect: Mood normal.        Behavior: Behavior normal.     GU / Detailed Urogynecologic Evaluation:  Pelvic Exam: Normal external female genitalia; Bartholin's and Skene's glands normal in appearance; urethral meatus normal in appearance, no urethral masses or discharge.   CST: negative   Speculum exam reveals normal vaginal mucosa with atrophy. Cervix normal appearance. Uterus absent. Adnexa no mass, fullness, tenderness.    Pelvic floor strength I/V  Pelvic floor musculature: Right levator non-tender, Right obturator non-tender, Left levator non-tender, Left obturator non-tender  POP-Q:   POP-Q  -2.5                                            Aa   -2.5                                           Ba  -8                                              C  2                                            Gh  3                                            Pb  9.5                                            tvl   -3                                            Ap  -3                                            Bp  -9                                              D     Rectal Exam:  Normal sphincter tone, no distal rectocele, enterocoele not present, no rectal masses, no sign of dyssynergia when asking the patient to bear down.  Post-Void Residual (PVR) by Bladder Scan: In order to evaluate bladder emptying, we discussed obtaining a postvoid residual and she agreed to this procedure.  Procedure: The ultrasound unit was placed on the patient's abdomen in the suprapubic region after the patient had  voided. A PVR of 16 ml was obtained by bladder scan.  Laboratory Results: POC urine: trace leukocytes   ASSESSMENT AND PLAN Ms. Plack is a 71 y.o. with:  1. Urinary frequency   2. Urinary urgency   3. Constipation, unspecified constipation type   4. Vaginal atrophy    Urinary urgency and frequency - has improved since her surgery, likely exacerbated due to bladder injury.  - discussed avoidance of bladder irritants like caffeine  2. Constipation - currently seeing GI for management - recommended adding in Power pudding recipe for added fiber  3. Vaginal atrophy - start estrace cream 0.5g nightly for two weeks then twice weekly after  Return as needed   Jaquita Folds, MD

## 2021-10-08 ENCOUNTER — Encounter: Payer: Self-pay | Admitting: Obstetrics and Gynecology

## 2021-10-08 ENCOUNTER — Ambulatory Visit: Payer: Medicare HMO | Admitting: Obstetrics and Gynecology

## 2021-10-08 ENCOUNTER — Other Ambulatory Visit: Payer: Self-pay | Admitting: Family Medicine

## 2021-10-08 ENCOUNTER — Other Ambulatory Visit: Payer: Self-pay

## 2021-10-08 VITALS — BP 147/75 | HR 79 | Ht 66.0 in | Wt 138.0 lb

## 2021-10-08 DIAGNOSIS — K5900 Constipation, unspecified: Secondary | ICD-10-CM

## 2021-10-08 DIAGNOSIS — R350 Frequency of micturition: Secondary | ICD-10-CM

## 2021-10-08 DIAGNOSIS — N952 Postmenopausal atrophic vaginitis: Secondary | ICD-10-CM | POA: Diagnosis not present

## 2021-10-08 DIAGNOSIS — R3915 Urgency of urination: Secondary | ICD-10-CM | POA: Diagnosis not present

## 2021-10-08 DIAGNOSIS — J4520 Mild intermittent asthma, uncomplicated: Secondary | ICD-10-CM

## 2021-10-08 LAB — POCT URINALYSIS DIPSTICK
Appearance: NORMAL
Bilirubin, UA: NEGATIVE
Blood, UA: NEGATIVE
Glucose, UA: NEGATIVE
Ketones, UA: NEGATIVE
Nitrite, UA: NEGATIVE
Protein, UA: NEGATIVE
Spec Grav, UA: 1.015 (ref 1.010–1.025)
Urobilinogen, UA: 0.2 E.U./dL
pH, UA: 7 (ref 5.0–8.0)

## 2021-10-08 MED ORDER — ESTRADIOL 0.1 MG/GM VA CREA: 0.5000 g | TOPICAL_CREAM | VAGINAL | 11 refills | Status: DC

## 2021-10-08 NOTE — Patient Instructions (Addendum)
Start vaginal estrogen therapy nightly for two weeks then 2 times weekly at night for treatment of vaginal atrophy (dryness of the vaginal tissues).  Please let us know if the prescription is too expensive and we can look for alternative options.    Today we talked about ways to manage bladder urgency such as altering your diet to avoid irritative beverages and foods (bladder diet) as well as attempting to decrease stress and other exacerbating factors.    The Most Bothersome Foods* The Least Bothersome Foods*  Coffee - Regular & Decaf Tea - caffeinated Carbonated beverages - cola, non-colas, diet & caffeine-free Alcohols - Beer, Red Wine, White Wine, Champagne Fruits - Grapefruit, [place], Orange, [employer] - Cranberry, Grapefruit, Orange, Pineapple Vegetables - Tomato & Tomato Products Flavor Enhancers - Hot peppers, Spicy foods, Chili, Horseradish, Vinegar, Monosodium glutamate (MSG) Artificial Sweeteners - NutraSweet, Sweet 'N Low, Equal (sweetener), Saccharin Ethnic foods - [place], [place], [place] food Express Scripts - low-fat & whole Fruits - Bananas, Blueberries, Honey[place]w melon, Pears, Raisins, Watermelon Vegetables - Broccoli, Brussels Sprouts, [place], Carrots, Cauliflower, [place], Cucumber, Mushrooms, Peas, Radishes, Squash, Zucchini, White potatoes, Sweet potatoes & yams Poultry - Chicken, Eggs, [place], [employer] - Beef, Programmer, multimedia, Lamb Seafood - Shrimp, [place] fish, Salmon Grains - Oat, Rice Snacks - Pretzels, Popcorn  *Lissa Morales et al. Diet and its role in interstitial cystitis/bladder pain syndrome (IC/BPS) and comorbid conditions. [place] 2012 Jan 11.   Constipation: Our goal is to achieve formed bowel movements daily or every-other-day.  You may need to try different combinations of the following options to find what works best for you - everybody's body works differently so feel free to adjust the dosages as needed.  Some options to help maintain bowel  health include:  Dietary changes (more leafy greens, vegetables and fruits; less processed foods) Fiber supplementation (Benefiber, FiberCon, Metamucil or Psyllium). Start slow and increase gradually to full dose. Over-the-counter agents such as: stool softeners (Docusate or Colace) and/or laxatives (Miralax, milk of magnesia)  "Power Pudding" is a natural mixture that may help your constipation.  To make blend 1 cup applesauce, 1 cup wheat bran, and 3/4 cup prune juice, refrigerate and then take 1 tablespoon daily with a large glass of water as needed.

## 2021-10-16 ENCOUNTER — Ambulatory Visit: Payer: Medicare HMO | Admitting: Dermatology

## 2021-10-17 ENCOUNTER — Encounter: Payer: Self-pay | Admitting: Obstetrics and Gynecology

## 2021-10-21 ENCOUNTER — Other Ambulatory Visit: Payer: Self-pay | Admitting: Family Medicine

## 2021-10-21 DIAGNOSIS — R140 Abdominal distension (gaseous): Secondary | ICD-10-CM | POA: Diagnosis not present

## 2021-10-21 DIAGNOSIS — Z79899 Other long term (current) drug therapy: Secondary | ICD-10-CM | POA: Diagnosis not present

## 2021-10-21 DIAGNOSIS — K219 Gastro-esophageal reflux disease without esophagitis: Secondary | ICD-10-CM | POA: Diagnosis not present

## 2021-10-21 DIAGNOSIS — E782 Mixed hyperlipidemia: Secondary | ICD-10-CM

## 2021-10-21 DIAGNOSIS — K5900 Constipation, unspecified: Secondary | ICD-10-CM | POA: Diagnosis not present

## 2021-10-23 ENCOUNTER — Other Ambulatory Visit: Payer: Self-pay

## 2021-10-25 ENCOUNTER — Other Ambulatory Visit: Payer: Self-pay

## 2021-10-25 ENCOUNTER — Ambulatory Visit (INDEPENDENT_AMBULATORY_CARE_PROVIDER_SITE_OTHER): Payer: Medicare HMO | Admitting: Family Medicine

## 2021-10-25 VITALS — BP 130/72 | HR 75 | Temp 97.8°F | Ht 66.0 in | Wt 138.0 lb

## 2021-10-25 DIAGNOSIS — G8929 Other chronic pain: Secondary | ICD-10-CM

## 2021-10-25 DIAGNOSIS — N958 Other specified menopausal and perimenopausal disorders: Secondary | ICD-10-CM | POA: Diagnosis not present

## 2021-10-25 DIAGNOSIS — R519 Headache, unspecified: Secondary | ICD-10-CM | POA: Diagnosis not present

## 2021-10-25 DIAGNOSIS — I10 Essential (primary) hypertension: Secondary | ICD-10-CM

## 2021-10-25 DIAGNOSIS — N644 Mastodynia: Secondary | ICD-10-CM

## 2021-10-25 NOTE — Progress Notes (Signed)
[facility] [address] [place] [address] Dept: [phone] Dept Fax: [phone]  Chronic Care Office Visit  Subjective:    Patient ID: Megan [name], female    DOB: [birth year], 71 y.o..   MRN: [id number]  Chief Complaint  Patient presents with   Follow-up    3 month f/u.  No concerns.  Fasting today.      History of Present Illness:  Patient is in today for reassessment of chronic medical issues.  [name] has a history of chronic left-sided headaches.  She was tried on gabapentin, without improvements. She was seen by [name] (Neurology NP) in Dec. and prescribed Topamax. [name] doesn't like the way the topiramate made her feel, so has not been taking this. She feels her headaches have reduced in frequency more lately, so she feels she is managing without this.   [name] has a history of hypertension. She is managed on diltiazem and metoprolol. She also takes a daily aspirin.    [name] has aortic atherosclerosis and hyperlipidemia. She is managed on atorvastatin.   [name] underwent robotic hysterectomy, bilateral salpingo-oophorectomy and sacrocolpopexy on November 15, 2020. Her surgery was complicated by a ruptured bladder. She had been having ongoing urinary urgency. Dr. Talbert [name] (GYN) referred her to Dr. Wannetta [name] (urogynecology). As [name]'s urinary urgency was improving, Dr. Wannetta [name] did not recommend more immediate interventions. She did start [name] on vaginal estrogen cream for vaginal atrophy.  [name] notes she is due for a follow-up diagnostic mammogram of the left breast. She notes she needs a physicians's order for this.  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   Closed fracture of right distal radius 01/12/2019   Moderate obstructive sleep apnea 05/21/2017   Vitamin D deficiency 07/17/2016   Abdominal aortic atherosclerosis ([place]) 07/16/2016   Renal artery stenosis ([place]) 06/12/2016   Chronic low back pain 08/24/2015   Hyperlipidemia, mixed 12/17/2014   Hepatic cyst 05/22/2014   Insomnia due to other mental disorder 03/10/2014   Pancreatic cyst 04/19/2013   Osteoporosis 09/07/2012   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   Tricuspid regurgitation, Mild-Moderate 07/21/2011   Mitral regurgitation, Trivial 07/21/2011   Gastritis 06/17/2011   Endometrial hyperplasia    Diverticulosis    IBS (irritable bowel syndrome) with constipation 04/24/2011   Hiatal hernia 03/04/2011   Basal cell carcinoma (BCC) of face 11/15/2009   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   Essential hypertension 01/04/2009   Past Surgical History:  Procedure Laterality Date   APPENDECTOMY     endoscopic hemoclip     eye surgery Left    lens implant   HYSTEROSCOPY     POLYP   OPEN REDUCTION INTERNAL FIXATION (ORIF) DISTAL RADIAL FRACTURE Left 01/17/2019   Procedure: LEFT OPEN REDUCTION INTERNAL FIXATION (ORIF) DISTAL RADIUS FRACTURE;  Surgeon: Daryll Brod, MD;  Location: [facility];  Service: Orthopedics;  Laterality:  Left;  AXILLARY BLOCK   PERIPHERAL VASCULAR CATHETERIZATION Left 07/24/2016   Procedure: Renal Angiography;  Surgeon: Conrad Freeport, MD;  Location: [facility];  Service: Cardiovascular;  Laterality: Left;   ROBOTIC  ASSISTED BILATERAL SALPINGO OOPHERECTOMY  11/15/2020   ROBOTIC ASSISTED LAPAROSCOPIC SACROCOLPOPEXY Bilateral 11/15/2020   Procedure: XI ROBOTIC ASSISTED LAPAROSCOPIC SACROCOLPOPEXY AND SUPRACERVICIAL HYSTERECTOMY BILATERAL SALPINGO-OOPHERECTOMY;  Surgeon: Ardis Hughs, MD;  Location: [facility];  Service: Urology;  Laterality: Bilateral;  REQUESTING 4.5 HRS   ROBOTIC ASSISTED SUPRACERVICAL HYSTERECTOMY  11/15/2020   UPPER GI ENDOSCOPY     UTERINE FIBROID SURGERY     Family History  Problem Relation Age of Onset   Colon cancer Mother    Diabetes Mother    Hypertension Mother    Colon polyps Father    Parkinson's disease Father    Colon cancer Maternal Grandmother    Breast cancer Maternal Grandmother 52   Diabetes Paternal Grandmother    Breast cancer Paternal Grandmother 7   Allergies Sister    Arthritis Brother        b/l hip replace   Alcohol abuse Daughter    Arthritis Son    Hypertension Son    Allergies Son    Osteoporosis Son    Osteoporosis Son    Arthritis Son        back disease, DDD   Allergies Son    Arthritis Son    Irritable bowel syndrome Son        RSD   Hypertension Paternal Grandfather    Heart disease Paternal Grandfather    Aneurysm Paternal Grandfather    Colon cancer Maternal Aunt    Colon cancer Cousin        X3   Ovarian cancer Cousin    Outpatient Medications Prior to Visit  Medication Sig Dispense Refill   albuterol (VENTOLIN HFA) 108 (90 Base) MCG/ACT inhaler TAKE 2 PUFFS BY MOUTH EVERY 6 HOURS AS NEEDED FOR WHEEZE OR SHORTNESS OF BREATH 8.5 each 1   ALPRAZolam (XANAX) 1 MG tablet TAKE 1 TABLET BY MOUTH TWICE A DAY AS NEEDED 30 tablet 1   Alum & Mag Hydroxide-Simeth (MYLANTA PO) Take by mouth. PRN     Alum Hydroxide-Mag Carbonate (GAVISCON PO) Take by mouth. PRN     aspirin EC 81 MG tablet Take 81 mg by mouth daily. Swallow whole.     atorvastatin (LIPITOR) 20 MG tablet TAKE 1 TABLET BY MOUTH EVERY DAY 90 tablet 1   Biotin 10 MG TABS Take  by mouth.     cholecalciferol (VITAMIN D3) 25 MCG (1000 UNIT) tablet Take 5,000 Units by mouth daily.     dicyclomine (BENTYL) 10 MG capsule      diltiazem (CARDIZEM CD) 180 MG 24 hr capsule TAKE 1 CAPSULE BY MOUTH EVERY DAY 90 capsule 3   DULoxetine (CYMBALTA) 30 MG capsule TAKE 1 CAPSULE BY MOUTH EVERY DAY 30 capsule 5   estradiol (ESTRACE) 0.1 MG/GM vaginal cream Place 0.5 g vaginally 2 (two) times a week. Place 0.5g nightly for two weeks then twice a week after 30 g 11   famotidine-calcium carbonate-magnesium hydroxide (PEPCID COMPLETE) 10-800-165 MG chewable tablet Chew 1 tablet by mouth daily as needed.     gabapentin (NEURONTIN) 300 MG capsule TAKE 1 CAPSULE BY MOUTH THREE TIMES A DAY (Patient taking differently: 100 mg 3 (three) times daily.) 90 capsule 5   linaclotide (LINZESS) 72 MCG capsule Take 1 capsule (72 mcg total) by mouth daily  before breakfast. 30 capsule 2   Multiple Vitamin (MULTIVITAMIN) tablet Take 1 tablet by mouth daily.     omeprazole (PRILOSEC) 40 MG capsule Take 40 mg by mouth in the morning and at bedtime.     ondansetron (ZOFRAN) 4 MG tablet Take 1 tablet (4 mg total) by mouth every 8 (eight) hours as needed for nausea or vomiting. 20 tablet 6   polyethylene glycol (MIRALAX / GLYCOLAX) 17 g packet Take 17 g by mouth daily. 14 each 0   promethazine (PHENERGAN) 25 MG tablet Take 25 mg by mouth every 6 (six) hours as needed for nausea or vomiting.     Rimegepant Sulfate (NURTEC) 75 MG TBDP Take 75 mg by mouth daily as needed (Take 1 tablet daily at onset of headache, max is 75 mg in 24 hours). 10 tablet 11   sucralfate (CARAFATE) 1 g tablet TAKE 1 TABLET BY MOUTH FOUR TIMES A DAY AS NEEDED     topiramate (TOPAMAX) 25 MG tablet Start taking 1 tablet at bedtime for 3 nights, then take 2 tablets at bedtime x 3 nights, then take 3 tablet at bedtime, stay on this dose 90 tablet 5   No facility-administered medications prior to visit.   Allergies  Allergen Reactions    Sulfamethoxazole Shortness Of Breath    chest tightness   Amitriptyline Anxiety and Other (See Comments)    Depression, insomnia   Lactose Intolerance (Gi) Diarrhea and Nausea And Vomiting   Lexapro [Escitalopram Oxalate]     nausea   Prednisone Palpitations     Objective:   Today's Vitals   10/25/21 0955  BP: 130/72  Pulse: 75  Temp: 97.8 F (36.6 C)  TempSrc: Temporal  SpO2: 98%  Weight: 138 lb (62.6 kg)  Height: '5\' 6"'$  (1.676 m)   Body mass index is 22.27 kg/m.   General: Well developed, well nourished. No acute distress. HEENT: Normocephalic, non-traumatic. External ears normal. EAC and TMs normal bilaterally. Psych: Alert and oriented. Normal mood and affect.  Health Maintenance Due  Topic Date Due   Hepatitis C Screening  Never done   Zoster Vaccines- Shingrix (1 of 2) Never done   Pneumonia Vaccine 63+ Years old (1 - PCV) Never done     Assessment & Plan:   1. Essential hypertension Blood pressure is adequately managed. Continue diltiazem.  2. Chronic left-sided headache I reviewed neurology consult notes. I support Ms. Hodzic's decision to not take Topamax in light of side effects. We will continue to follow this for now.  3. Genitourinary syndrome of menopause Reviewed urogynecology consult notes. Recently started vaginal estrogen cream.  4. Breast pain, left Order palced for follow-up diagnostic mamogram.  - MM Digital Diagnostic Unilat L; Future   Return in about 3 months (around 01/25/2022) for Reassessment.   Haydee Salter, MD

## 2021-10-29 ENCOUNTER — Other Ambulatory Visit: Payer: Self-pay | Admitting: Family Medicine

## 2021-10-29 DIAGNOSIS — N644 Mastodynia: Secondary | ICD-10-CM

## 2021-10-30 ENCOUNTER — Telehealth: Payer: Self-pay | Admitting: Family Medicine

## 2021-10-30 NOTE — Chronic Care Management (AMB) (Signed)
?  Chronic Care Management  ? ?Note ? ?10/30/2021 ?Name: [name] MRN: [id number] DOB: 1951/01/13 ? ?Megan [name] is a 71 y.o. year old female who is a primary care patient of Gena Fray, Lillette Boxer, MD. I reached out to Amie Portland by phone today in response to a referral sent by Ms. Dianah C Maggio's PCP, Rudd, Lillette Boxer, MD.  ? ?[name] was given information about Chronic Care Management services today including:  ?CCM service includes personalized support from designated clinical staff supervised by her physician, including individualized plan of care and coordination with other care providers ?24/7 contact phone numbers for assistance for urgent and routine care needs. ?Service will only be billed when office clinical staff spend 20 minutes or more in a month to coordinate care. ?Only one practitioner may furnish and bill the service in a calendar month. ?The patient may stop CCM services at any time (effective at the end of the month) by phone call to the office staff. ? ? ?Patient agreed to services and verbal consent obtained.  ? ?Follow up plan: ? ? ?Tatjana Dellinger ?[facility] Scheduler  ?

## 2021-10-31 ENCOUNTER — Other Ambulatory Visit: Payer: Self-pay | Admitting: Family Medicine

## 2021-10-31 NOTE — Telephone Encounter (Signed)
Refill request for  ?Alprazolam 1 mg ?LR 09/03/21, #30, 1 rf ?LOV 10/25/21 ?FOV  none scheduled.   ? ?Please review and advise.  ?Thanks.  Dm/cma ? ?

## 2021-11-13 ENCOUNTER — Ambulatory Visit
Admission: RE | Admit: 2021-11-13 | Discharge: 2021-11-13 | Disposition: A | Discharge status: Home / Self Care | Payer: Medicare HMO | Source: Ambulatory Visit | Attending: Family Medicine | Admitting: Family Medicine

## 2021-11-13 ENCOUNTER — Ambulatory Visit: Payer: Medicare HMO

## 2021-11-13 DIAGNOSIS — N644 Mastodynia: Secondary | ICD-10-CM

## 2021-11-13 DIAGNOSIS — R922 Inconclusive mammogram: Secondary | ICD-10-CM | POA: Diagnosis not present

## 2021-11-14 ENCOUNTER — Telehealth: Payer: Medicare HMO

## 2021-11-21 ENCOUNTER — Telehealth: Payer: Medicare HMO | Admitting: Physician Assistant

## 2021-11-21 DIAGNOSIS — J020 Streptococcal pharyngitis: Secondary | ICD-10-CM

## 2021-11-21 MED ORDER — AMOXICILLIN 500 MG PO CAPS: 500.0000 mg | ORAL_CAPSULE | Freq: Two times a day (BID) | ORAL | 0 refills | Status: AC

## 2021-11-21 NOTE — Progress Notes (Signed)

## 2021-12-04 ENCOUNTER — Encounter: Payer: Self-pay | Admitting: Physician Assistant

## 2021-12-04 ENCOUNTER — Telehealth: Payer: Medicare HMO | Admitting: Physician Assistant

## 2021-12-04 ENCOUNTER — Ambulatory Visit (HOSPITAL_COMMUNITY): Payer: Medicare HMO | Attending: Internal Medicine

## 2021-12-04 DIAGNOSIS — J0110 Acute frontal sinusitis, unspecified: Secondary | ICD-10-CM

## 2021-12-04 DIAGNOSIS — I10 Essential (primary) hypertension: Secondary | ICD-10-CM | POA: Insufficient documentation

## 2021-12-04 DIAGNOSIS — I361 Nonrheumatic tricuspid (valve) insufficiency: Secondary | ICD-10-CM | POA: Diagnosis not present

## 2021-12-04 DIAGNOSIS — R072 Precordial pain: Secondary | ICD-10-CM | POA: Insufficient documentation

## 2021-12-04 DIAGNOSIS — I071 Rheumatic tricuspid insufficiency: Secondary | ICD-10-CM | POA: Diagnosis not present

## 2021-12-04 DIAGNOSIS — E782 Mixed hyperlipidemia: Secondary | ICD-10-CM | POA: Insufficient documentation

## 2021-12-04 DIAGNOSIS — R002 Palpitations: Secondary | ICD-10-CM | POA: Diagnosis not present

## 2021-12-04 DIAGNOSIS — Z8709 Personal history of other diseases of the respiratory system: Secondary | ICD-10-CM | POA: Diagnosis not present

## 2021-12-04 LAB — ECHOCARDIOGRAM COMPLETE
Area-P 1/2: 3.91 cm2
S' Lateral: 2.5 cm

## 2021-12-04 MED ORDER — DOXYCYCLINE HYCLATE 100 MG PO TABS: 100.0000 mg | ORAL_TABLET | Freq: Two times a day (BID) | ORAL | 0 refills | Status: DC

## 2021-12-04 NOTE — Progress Notes (Signed)
I have spent 5 minutes in review of e-visit questionnaire, review and updating patient chart, medical decision making and response to patient.   Renezmae Canlas Cody Makeda Peeks, PA-C    

## 2021-12-04 NOTE — Progress Notes (Signed)
E-Visit for Sinus Problems ° °We are sorry that you are not feeling well.  Here is how we plan to help! ° °Based on what you have shared with me it looks like you have sinusitis.  Sinusitis is inflammation and infection in the sinus cavities of the head.  Based on your presentation I believe you most likely have Acute Bacterial Sinusitis.  This is an infection caused by bacteria and is treated with antibiotics. I have prescribed Doxycycline 100mg by mouth twice a day for 10 days. You may use an oral decongestant such as Mucinex D or if you have glaucoma or high blood pressure use plain Mucinex. Saline nasal spray help and can safely be used as often as needed for congestion.  If you develop worsening sinus pain, fever or notice severe headache and vision changes, or if symptoms are not better after completion of antibiotic, please schedule an appointment with a health care provider.   ° °Sinus infections are not as easily transmitted as other respiratory infection, however we still recommend that you avoid close contact with loved ones, especially the very young and elderly.  Remember to wash your hands thoroughly throughout the day as this is the number one way to prevent the spread of infection! ° °Home Care: °Only take medications as instructed by your medical team. °Complete the entire course of an antibiotic. °Do not take these medications with alcohol. °A steam or ultrasonic humidifier can help congestion.  You can place a towel over your head and breathe in the steam from hot water coming from a faucet. °Avoid close contacts especially the very young and the elderly. °Cover your mouth when you cough or sneeze. °Always remember to wash your hands. ° °Get Help Right Away If: °You develop worsening fever or sinus pain. °You develop a severe head ache or visual changes. °Your symptoms persist after you have completed your treatment plan. ° °Make sure you °Understand these instructions. °Will watch your  condition. °Will get help right away if you are not doing well or get worse. ° °Thank you for choosing an e-visit. ° °Your e-visit answers were reviewed by a board certified advanced clinical practitioner to complete your personal care plan. Depending upon the condition, your plan could have included both over the counter or prescription medications. ° °Please review your pharmacy choice. Make sure the pharmacy is open so you can pick up prescription now. If there is a problem, you may contact your provider through MyChart messaging and have the prescription routed to another pharmacy.  Your safety is important to us. If you have drug allergies check your prescription carefully.  ° °For the next 24 hours you can use MyChart to ask questions about today's visit, request a non-urgent call back, or ask for a work or school excuse. °You will get an email in the next two days asking about your experience. I hope that your e-visit has been valuable and will speed your recovery. ° °

## 2021-12-05 ENCOUNTER — Other Ambulatory Visit: Payer: Self-pay | Admitting: Family Medicine

## 2021-12-05 DIAGNOSIS — K581 Irritable bowel syndrome with constipation: Secondary | ICD-10-CM

## 2021-12-05 IMAGING — US US PELVIS COMPLETE
1 series · 14 of 25 positions shown · non-contrast
Comparison: Prior CT from 11/21/2020.

CLINICAL DATA: Initial evaluation for lower abdominal pain. History
of prior hysterectomy and bilateral oophorectomy with bladder
repair.

EXAM:
TRANSABDOMINAL ULTRASOUND OF PELVIS
TECHNIQUE: Transabdominal ultrasound examination of the pelvis was performed
including evaluation of the uterus, ovaries, adnexal regions, and
pelvic cul-de-sac.

[Series 1: us pelvis complete · 0.25mm/px · 14 of 37 slices shown]
[im 1/37]
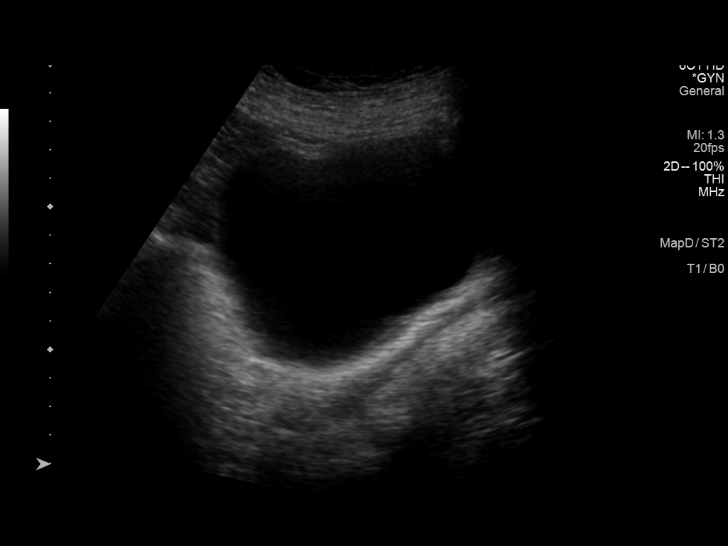
[im 4/37]
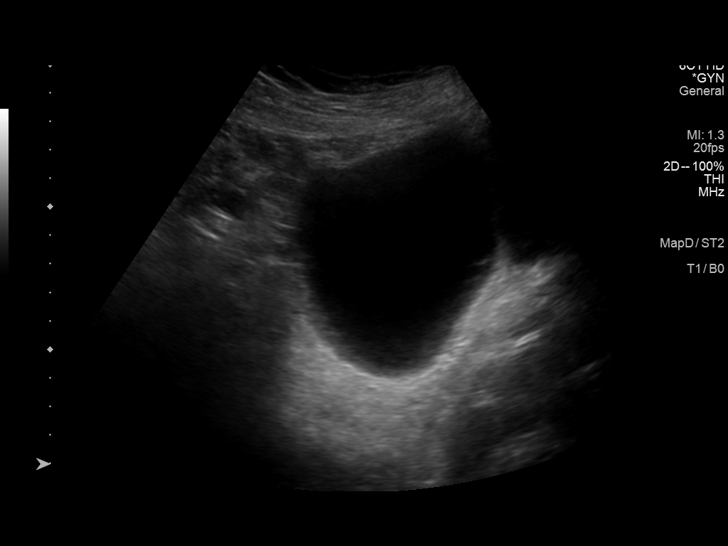
[im 7/37]
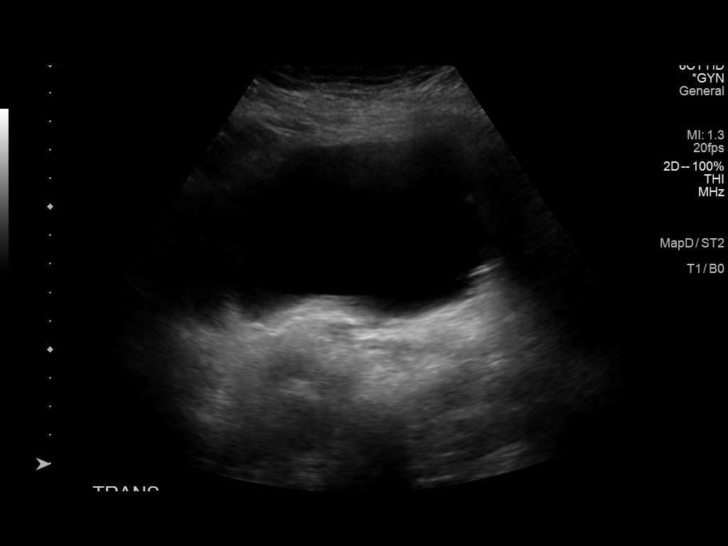
[im 10/37]
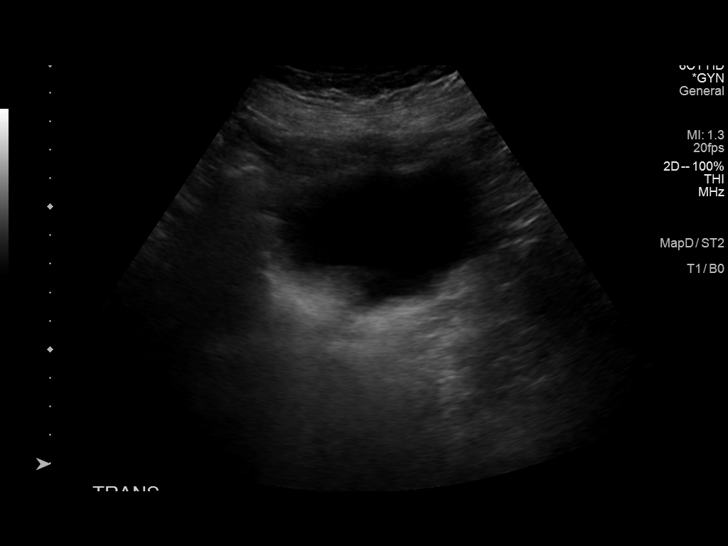
[im 13/37]
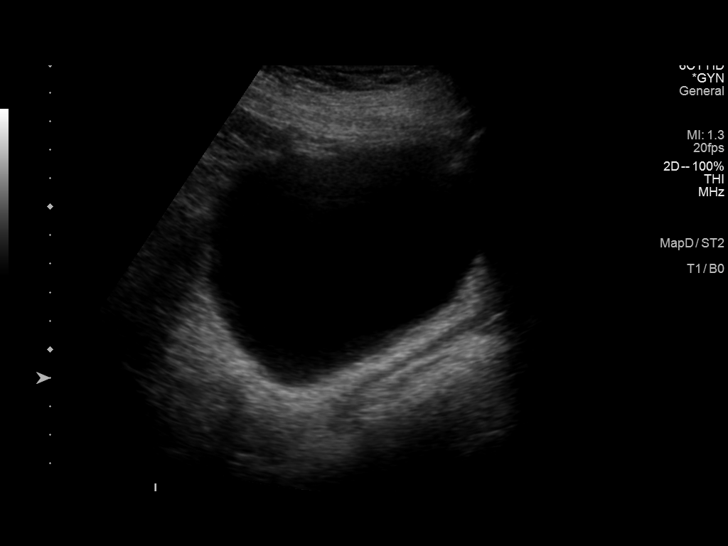
[im 14/37]
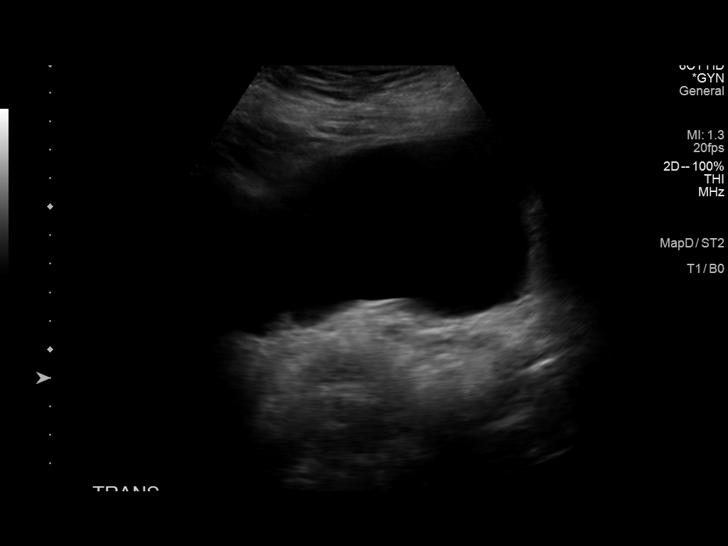
[im 17/37]
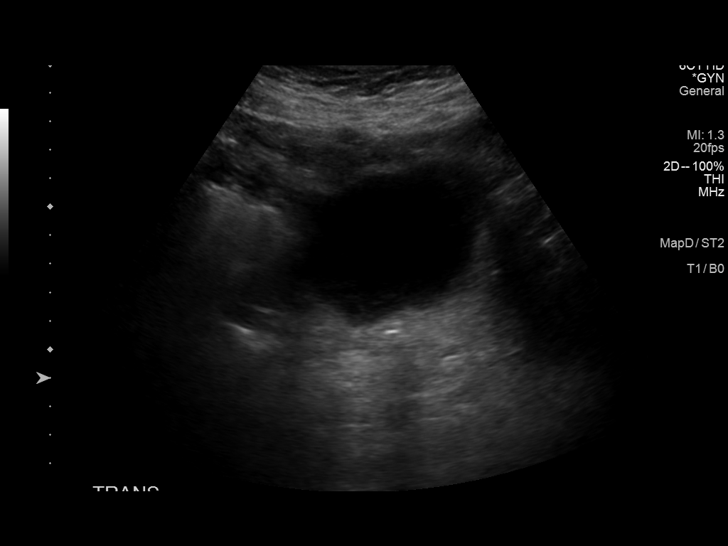
[im 20/37]
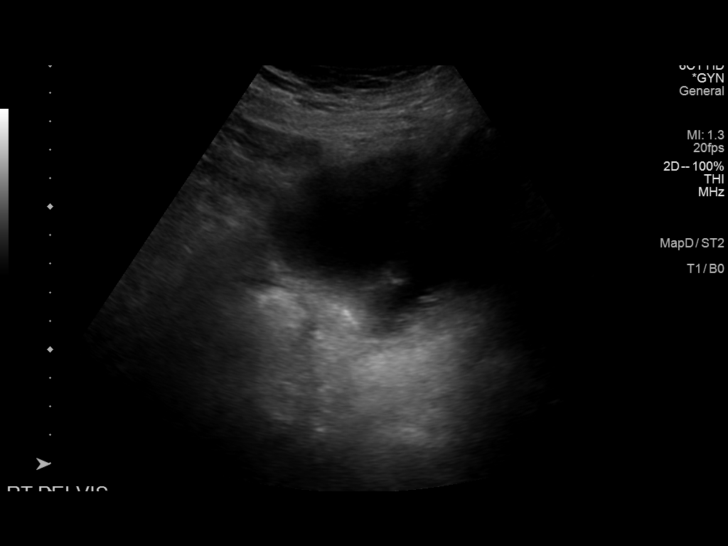
[im 23/37]
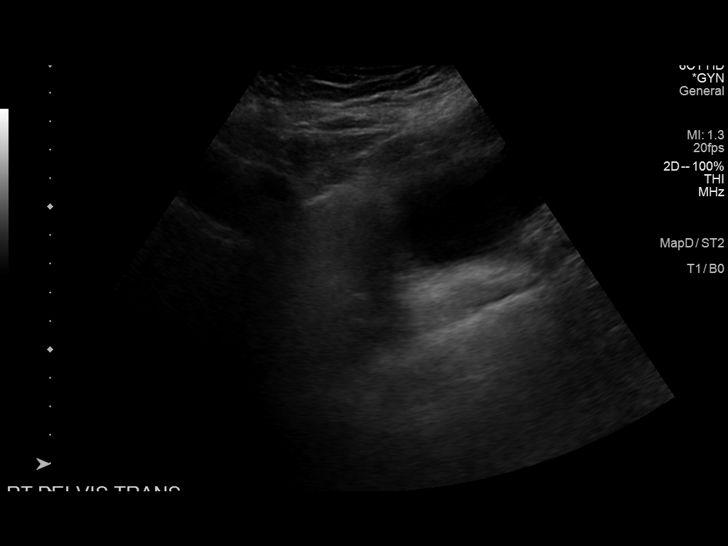
[im 25/37]
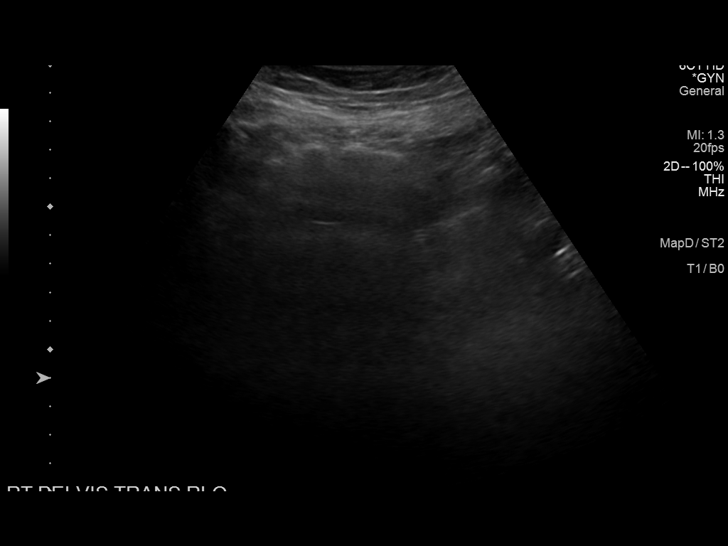
[im 28/37]
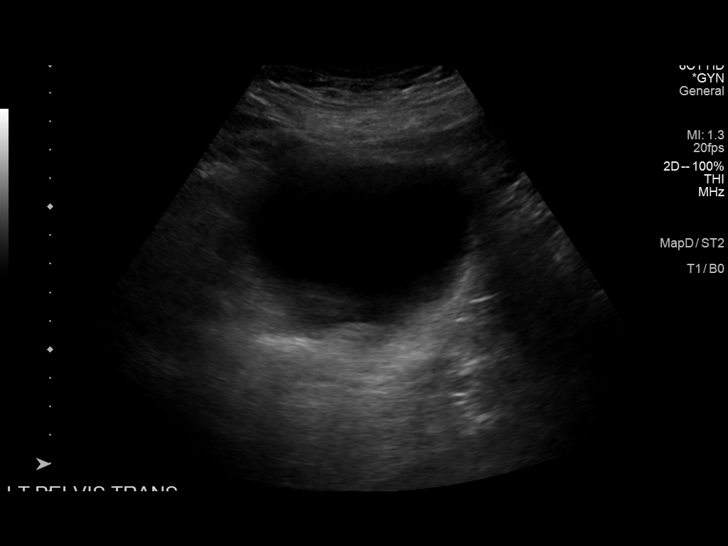
[im 31/37]
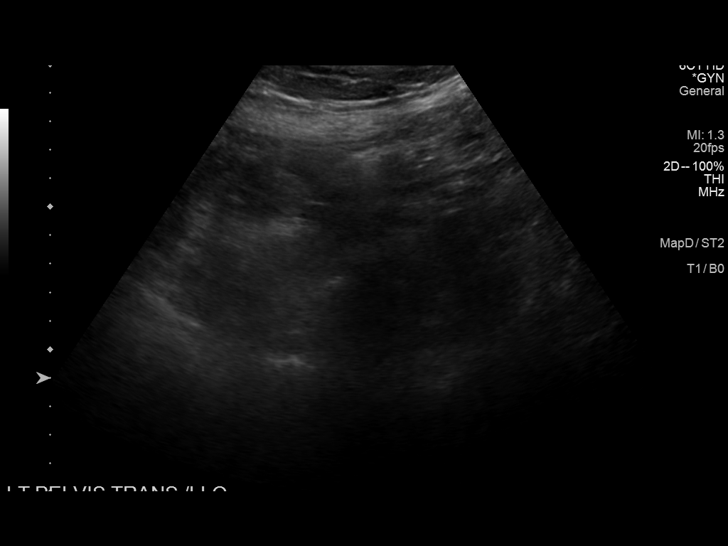
[im 34/37]
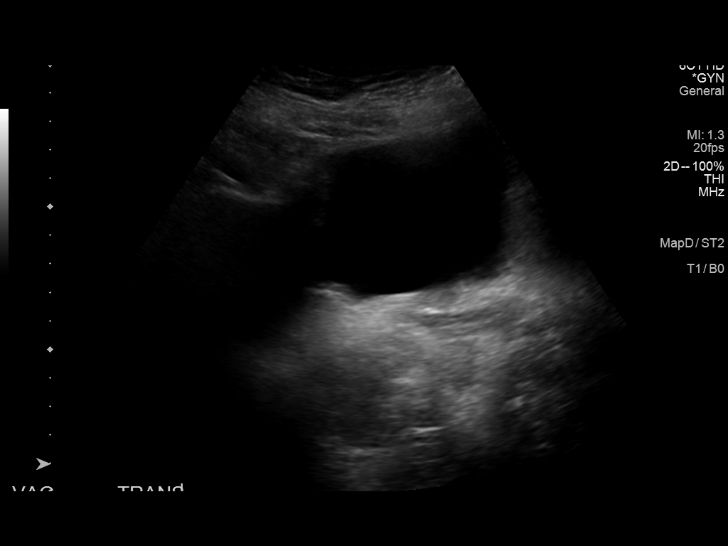
[im 37/37]
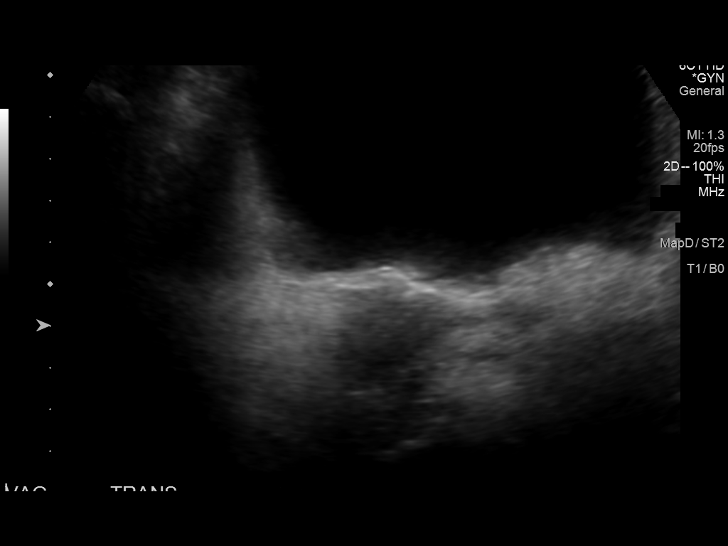

[14 of 25 positions shown; findings below may reference images not displayed]

FINDINGS: Uterus

Prior hysterectomy.  No abnormality about the vaginal cuff.

Endometrium

Surgically absent.

Right ovary

Not visualized.  No adnexal mass.

Left ovary

Not visualized.  No adnexal mass.

Other findings:  No abnormal free fluid.
IMPRESSION: 1. Prior hysterectomy with nonvisualization of the uterus and
ovaries.
2. Negative transabdominal pelvic ultrasound with no pelvic or
adnexal mass or abnormal free fluid. No findings to explain
patient's symptoms identified.

## 2021-12-06 ENCOUNTER — Other Ambulatory Visit: Payer: Self-pay | Admitting: Family Medicine

## 2021-12-09 ENCOUNTER — Ambulatory Visit: Payer: Medicare HMO | Admitting: Neurology

## 2021-12-18 ENCOUNTER — Ambulatory Visit: Payer: Medicare HMO | Admitting: Neurology

## 2021-12-18 ENCOUNTER — Encounter: Payer: Self-pay | Admitting: Neurology

## 2021-12-18 VITALS — BP 159/83 | HR 76 | Ht 66.0 in | Wt 140.0 lb

## 2021-12-18 DIAGNOSIS — R519 Headache, unspecified: Secondary | ICD-10-CM

## 2021-12-18 DIAGNOSIS — G3184 Mild cognitive impairment, so stated: Secondary | ICD-10-CM | POA: Diagnosis not present

## 2021-12-18 DIAGNOSIS — G8929 Other chronic pain: Secondary | ICD-10-CM

## 2021-12-18 DIAGNOSIS — Z113 Encounter for screening for infections with a predominantly sexual mode of transmission: Secondary | ICD-10-CM | POA: Diagnosis not present

## 2021-12-18 MED ORDER — TIZANIDINE HCL 4 MG PO TABS: 4.0000 mg | ORAL_TABLET | Freq: Three times a day (TID) | ORAL | 6 refills | Status: DC | PRN

## 2021-12-18 NOTE — Progress Notes (Signed)
? ?Chief Complaint  ?Patient presents with  ? Follow-up  ?  Rm 14. Alone. ?C/o constant, worsening left sided headache, sensitivity to touch on left side of face and neck. Worsens when laying on left side. Discontinued topamax and cymbalta due to side effects. Has not tried Nurtec.  ? ?HISTORICAL ?Megan [name] is a 71 years old female,  seen in request by her primary care physician PA Brunetta Jeans, [place] and ophthalmologist Dr. Warden Fillers for evaluation of headaches, initial evaluation was on April 07, 2018. ?  ?She has past medical history of depression anxiety, had intolerance to multiple different agents in the past, including amitriptyline, Effexor, but she denied previous history of headaches, early 2019, she began to have frequent left retro-orbital area pressure headaches, initially thought it was due to sinus issues, was evaluated by ENT Dr. Erik Obey, reported normal CT sinus in 2019. ?  ?She had left cataract surgery by Dr. Katy Fitch in April 2019, left vision overall recovered very well, but since her cataract surgery in April, left-sided headache has become worse, constant pressure, 5 out of 10 on a daily basis, often exacerbated to a more severe pounding headache with associated light noise sensitivity, nauseous, she tends to lying in bed for hours to help her headaches, she has tried over-the-counter ibuprofen and Tylenol with limited help, ?  ?We personally reviewed MRI of the brain in May 2017 for evaluation of acute onset right facial droop and numbness, there was no acute abnormality mild supratentorium small vessel disease, ?  ? ESR C-reactive protein were normal in April 2019. ? ?UPDATE Aug 09 2020: ?She lost follow-up since 2019, for left-sided headache quit for about 2 years, began to have frequent headaches again few months ago, early December 2021, she had 3 days of headache on the left parietal region, on December 7, when she woke up, her headache has much improved, but she felt  weak, tired, confused, she brushed her teeth at her kitchen sink, eventually presented to the emergency room, ? 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 ?She complains stress at home, difficulty sleeping, stress, tearful, ? ?Laboratory evaluations in December 2021, normal CMP, CBC, hemoglobin of 14.3. ? ?Update February 07, 2021 SS: Last seen Dec 2021, started Effexor-XR 37.5 mg, gabapentin 100 mg TID for migraine prevention, Fioricet PRN. CRP, sed rate, ANA were normal. Claims couldn't take Effexor, tried for 3 weeks, felt nauseated, palpations. Taking gabapentin 200 mg 2-3 times a day, does help. Reports daily ache to left parietal area, on good days can be dull, bad days is throbbing. When turns head to right sounds to her like click to left parietal area, not audible to anyone else. With headaches has nausea. At times is overwhelming to her, feels like bad earache but in her head. Has IBS, colitis. Remains on aspirin 81 mg daily. Ice and heat help the parietal area. Claims doesn't do well with antidepressants. Admits to anxiety, is the go getter in her family. Didn't take Fioricet.  ? ?Update August 08, 2021 SS: headaches still on left temple/parietal area, everyday, always dull but sometimes sharp pains throbbing. Gets nauseated. 4/7 days are bad headaches. Taking gabapentin 100 mg 3 times daily, the 300 mg twice daily gave her diarrhea with colitis. Claims blurry vision to the left eye, always,  blurry vision for last 2 months. The eye doctor has cleared her.  The worse the headache gets the worse it gets. Takes Tylenol without benefit. Cymbalta didn't agree with her, restarted it again 2 weeks ago to try again, has taken edge off stress. Fioricet made her heart race. A lot of stress with her and her husband, she worries a lot  about her family. On aspirin. ? ?UPDATE Dec 18 2021: ?She continued complaints of significant left-sided headache, tightness sensation, difficulty turning, difficulty lying on her left side, worst at nighttime, she has tried Fioricet, Effexor, Cymbalta, only effective, could not tolerate due to side effect ? ?She is taking gabapentin 300 mg twice daily, last dose was 5 PM, was not sure about the benefit, not a good candidate for NSAIDs due to acid reflux, heating pad always helps, ?  ?She also complains of lifelong history of anxiety, is a Research officer, trade [employer], noticed gradual onset of memory loss, slow reaction time, misplace things, MoCA examination 21/30, ? 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 ?We have personally reviewed MRI of the brain without contrast in December 2022, generalized atrophy, slight progression compared to MRI 2019,  small vessel disease, ? ?Normal ESR C-reactive protein December 2022 ?REVIEW OF SYSTEMS: Full 14 system review of systems  ? ?See HPI ? ?ALLERGIES: ?Allergies  ?Allergen Reactions  ? Sulfamethoxazole Shortness Of Breath  ?  chest tightness  ? Amitriptyline Anxiety and Other (See Comments)  ?  Depression, insomnia  ? Cymbalta [Duloxetine Hcl] Other (See Comments)  ?  Insomnia, constipation  ? Lactose Intolerance (Gi) Diarrhea and Nausea And Vomiting  ? Lexapro [Escitalopram Oxalate]   ?  nausea  ? Topamax [Topiramate] Itching  ? Prednisone Palpitations  ? ? ?HOME MEDICATIONS: ?Current Outpatient Medications  ?Medication Sig Dispense Refill  ? albuterol (VENTOLIN HFA) 108 (90 Base) MCG/ACT inhaler TAKE 2 PUFFS BY MOUTH EVERY 6 HOURS AS NEEDED FOR WHEEZE OR SHORTNESS OF BREATH 8.5 each 1  ? ALPRAZolam (XANAX) 1 MG tablet TAKE 1 TABLET BY MOUTH TWICE A DAY AS NEEDED 30 tablet 1  ? Alum & Mag Hydroxide-Simeth (MYLANTA PO) Take by mouth. PRN    ? Alum Hydroxide-Mag Carbonate (GAVISCON PO) Take by mouth. PRN    ? aspirin EC 81  MG tablet Take 81 mg by mouth daily. Swallow whole.    ? atorvastatin (LIPITOR) 20 MG tablet TAKE 1 TABLET BY MOUTH EVERY DAY 90 tablet 1  ? Biotin 10 MG TABS Take by mouth.    ? cholecalciferol (VITAMIN D3) 25 MCG (1000 UNIT) tablet Take 5,000 Units by mouth daily.    ? dicyclomine (BENTYL) 10 MG capsule     ? diltiazem (CARDIZEM CD) 180 MG 24 hr capsule TAKE 1 CAPSULE BY MOUTH EVERY DAY 90 capsule 3  ? estradiol (ESTRACE) 0.1 MG/GM vaginal cream Place 0.5 g vaginally 2 (two) times a week. Place 0.5g nightly for two weeks then twice a week after 30 g 11  ? famotidine-calcium carbonate-magnesium hydroxide (PEPCID COMPLETE) 10-800-165 MG chewable tablet Chew 1 tablet by mouth daily as needed.    ? gabapentin (NEURONTIN) 300 MG capsule TAKE 1 CAPSULE BY MOUTH THREE TIMES A DAY (Patient taking differently: 100 mg 3 (three) times daily.) 90 capsule 5  ? linaclotide (LINZESS) 72 MCG capsule Take 1 capsule (72 mcg total) by mouth daily before breakfast. 30 capsule 2  ? Multiple Vitamin (MULTIVITAMIN) tablet Take 1  tablet by mouth daily.    ? omeprazole (PRILOSEC) 40 MG capsule Take 40 mg by mouth in the morning and at bedtime.    ? ondansetron (ZOFRAN) 4 MG tablet TAKE 1 TABLET BY MOUTH EVERY 8 HOURS AS NEEDED FOR NAUSEA AND VOMITING 20 tablet 1  ? polyethylene glycol (MIRALAX / GLYCOLAX) 17 g packet Take 17 g by mouth daily. 14 each 0  ? promethazine (PHENERGAN) 25 MG tablet Take 25 mg by mouth every 6 (six) hours as needed for nausea or vomiting.    ? sucralfate (CARAFATE) 1 g tablet TAKE 1 TABLET BY MOUTH FOUR TIMES A DAY AS NEEDED    ? Rimegepant Sulfate (NURTEC) 75 MG TBDP Take 75 mg by mouth daily as needed (Take 1 tablet daily at onset of headache, max is 75 mg in 24 hours). (Patient not taking: Reported on 12/18/2021) 10 tablet 11  ? ?No current facility-administered medications for this visit.  ? ? ?PAST MEDICAL HISTORY: ?Past Medical History:  ?Diagnosis Date  ? Adrenal adenoma 05/22/2014  ? 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 Allergic state 12/31/2012  ? Anxiety   ? Asthma   ? triggerred by fragrances   ? Barrett esophagus   ? Blind loop syndrome   ? C. difficile diarrhea   ? Cancer [facility])   ? pt denies

## 2021-12-18 NOTE — Progress Notes (Signed)
? ?  History: patient complains of persistent left occipital area headache, with significant tenderness on deep palpitation,  ? ?Trigger point injection of left occipital region, use combination of 0.25 bupivacaine (1.5 cc) mixed with 1.5 cc of betamethasone (4.'5mg'$ ) ? ?Injection was placed at left occipital region along left nuchal line, mastoid, where she had most tenderness upon deep palpitation, ? ?The patient tolerated the injections well, no complications of the procedure were noted. Injections were made with a 27-gauge needle. ? ?  ?  ?

## 2021-12-18 NOTE — Patient Instructions (Signed)
Take gabapentin before you go to bed, instead during the day ? ?Also tizanidine 4 mg half to 1 tablets as needed for left side neck pain, muscle spasm, may combine it with Tylenol, gabapentin ? ?You would benefit heating pad, massage of left upper cervical/occipital region, ? ?Please bring family member at next office visit in 6 months. ?

## 2021-12-19 LAB — RPR: RPR Ser Ql: NONREACTIVE

## 2021-12-19 LAB — VITAMIN B12: Vitamin B-12: >2000 pg/mL — ABNORMAL HIGH (ref 232–1245)

## 2021-12-19 LAB — TSH: TSH: 1.69 u[IU]/mL (ref 0.450–4.500)

## 2021-12-22 DIAGNOSIS — I272 Pulmonary hypertension, unspecified: Secondary | ICD-10-CM | POA: Insufficient documentation

## 2021-12-22 NOTE — Progress Notes (Signed)
?Cardiology Office Note:   ? ?Date:  12/23/2021  ? ?ID:  BEAU VANDUZER, DOB 1951/02/18, MRN [id number] ? ?PCP:  Haydee Salter, MD  ?[facility] Providers ?Cardiologist:  Sherren Mocha, MD     ?Referring MD: Haydee Salter, MD  ? ?Chief Complaint:  Follow-up for tricuspid regurgitation, pulmonary hypertensi and Chest Pain ?  ? 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 ?Prior CV Studies: ?ECHO COMPLETE WO IMAGING ENHANCING AGENT 12/04/2021 ?EF 60-65, normal RVSF, moderately elevated PASP, RVSP 52.1, trivial MR, mild to moderate TR, tricuspid regurgitant velocity 3.32 m/s ?   ?LONG TERM MONITOR (3-7 DAYS) INTERPRETATION 01/03/2020 ?Narrative ?1. The basic rhythm is normal sinus with an average HR of 78 bpm ?2. No atrial fibrillation or flutter ?3. No high-grade heart block or pathologic pauses ?4. There are no PVC's and rare supraventricular beats without sustained arrhythmias ?5. There is a short run of PSVT, 9 beats ?Overall benign monitor result ?  ?Echocardiogram 12/05/19 ?EF 60-65, no RWMA, normal RVSF, mildly elevated PASP, RVSP 34.6, mild LAE, trivial MR, mild-moderate TR ?  ?GATED SPECT MYO PERF W/EXERCISE STRESS 1D 07/09/2016 ?Narrative ?? Nuclear stress EF: 83%. ?? The patient walked for 4 minutes of a Bruce protocol. Peak HR of 185 whicih is 97% PMHR. RBBB. No ST or T wave changes. ?? The study is normal. ?? This is a low risk study. ?? The left ventricular ejection fraction is hyperdynamic (>65%). ? ?History of Present Illness:   ?Megan [name] is a 71 y.o. female with the above problem list.  She was last seein in April 2022.  Her last echocardiogram was in April 2021.  Therefore, she was brought back in April 2023 for 2 year f/u echocardiogram.  This demonstrated  mild to mod TR.  Her EF is normal.  She now has mod pulmonary hypertension with RVSP  52.1.  TV regurgitant volume was 2.81 in 2021 and now 3.3 ms.  I asked Dr. Burt Knack to review this who discussed it with Dr. Aundra Dubin.  It was recommended that she have a R cardiac catheterization to evaluate for PAH vs HFpEF.   ? ?She returns to arrange R cardiac catheterization.  She is here alone.  She notes several symptoms over the past month or so.  She has been fatigued.  She has also noted shortness of breath with exertion at times.  She has stopped walking her dog because she is tired.  She has not had orthopnea.  She has slept on 2 pillows secondary to neck pain.  She has not had PND, edema, syncope.  She has had chest discomfort over the past month or so.  She describes substernal sharp type chest pain.  She notes it with emotional stress.  She gets it with exertion at times.  She can also exert herself at times without symptoms.  She has also noted rapid palpitations awakening her from sleep at night with associated diaphoresis.  This may last 5 or 10 minutes before resolving. ?   ?Past Medical History:  ?Diagnosis Date  ? Adrenal adenoma 05/22/2014  ? Adrenal gland cyst ([place]) 05/22/2014  ? Allergic state 12/31/2012  ? Anxiety   ? Asthma   ? triggerred by fragrances   ? Barrett esophagus   ? Blind loop syndrome   ? C. difficile diarrhea   ? Cancer [facility])   ?  pt denies   ? Chest pain, atypical 07/14/2011  ? Cold sore 07/17/2016  ? Colon polyp   ? Contact dermatitis 03/23/2012  ? Cystocele, midline   ? Depression   ? Diarrhea   ? Diverticulosis   ? Endometrial hyperplasia 02/27/2006  ? BENIGN ENDO BX ON 02/2007  ? GERD (gastroesophageal reflux disease)   ? Headache 04/16/2015  ? Headache   ? Headache(784.0) 04/15/2012  ? pt denies   ? Heart murmur   ? Hematuria 04/27/2012  ? Hepatic cyst   ? Hiatal hernia   ? Hiatal hernia   ? Hyperlipidemia, mixed 12/17/2014  ? Hypertension   ? IBS (irritable bowel syndrome)   ? Insomnia   ? Leaky heart valve   ? Low back pain 08/15/2013  ? Osteoporosis 03/2017  ? T score -2.5.  Without significant loss from prior studies  ? Pancreatic cyst 04/19/2013  ? Paronychia of great toe, left 06/19/2013  ? Preventative health care 09/11/2014  ? Rectal bleeding 10/10/2011  ? Rectocele   ? 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 Sinusitis acute 02/03/2012  ? Thrush 07/21/2011  ? Tricuspid regurgitation, Mild-Moderate 07/21/2011  ? Moderate by 2 d echocardiogram Echo 11/2021: EF 60-65, no RWMA, normal RVSF, moderately elevated PASP, RVSP 52.1, trivial MR, mild to moderate TR  ? Unspecified hypothyroidism   ? Unspecified menopausal and postmenopausal disorder   ? Uterine prolapse without mention of vaginal wall prolapse   ? Vitamin B12 deficiency   ? Vitamin D deficiency 07/17/2016  ? ?Current Medications: ?Current Meds  ?Medication Sig  ? albuterol (VENTOLIN HFA) 108 (90 Base) MCG/ACT inhaler TAKE 2 PUFFS BY MOUTH EVERY 6 HOURS AS NEEDED FOR WHEEZE OR SHORTNESS OF BREATH  ? ALPRAZolam (XANAX) 1 MG tablet TAKE 1 TABLET BY MOUTH TWICE A DAY AS NEEDED  ? Alum & Mag Hydroxide-Simeth (MYLANTA PO) Take by mouth. PRN  ? Alum Hydroxide-Mag Carbonate (GAVISCON PO) Take by mouth. PRN  ? aspirin EC 81 MG tablet Take 81 mg by mouth daily. Swallow whole.  ? atorvastatin (LIPITOR) 20 MG tablet TAKE 1 TABLET BY MOUTH EVERY DAY  ? Biotin 10 MG TABS Take by mouth.  ? cholecalciferol (VITAMIN D3) 25 MCG (1000 UNIT) tablet Take 5,000 Units by mouth daily.  ? dicyclomine (BENTYL) 10 MG capsule   ? diltiazem (CARDIZEM CD) 180 MG 24 hr capsule TAKE 1 CAPSULE BY MOUTH EVERY DAY  ? estradiol (ESTRACE) 0.1 MG/GM vaginal cream Place 0.5 g vaginally 2 (two) times a week. Place 0.5g nightly for two weeks then twice a week after  ? famotidine-calcium carbonate-magnesium hydroxide (PEPCID COMPLETE) 10-800-165 MG chewable tablet Chew 1 tablet by mouth daily as needed.  ? gabapentin (NEURONTIN) 300 MG capsule TAKE 1 CAPSULE BY MOUTH THREE TIMES A DAY  ? linaclotide (LINZESS) 72 MCG capsule Take 1 capsule (72 mcg total) by mouth daily  before breakfast.  ? metoprolol succinate (TOPROL-XL) 25 MG 24 hr tablet Take 25 mg by mouth daily.  ? metoprolol tartrate (LOPRESSOR) 100 MG tablet TAKE 1 TABLET BY MOUTH 2 HOURS PRIOR TO THE CARDIAC CT  ? Multiple Vitamin (MULTIVITAMIN) tablet Take 1 tablet by mouth daily.  ? omeprazole (PRILOSEC) 40 MG capsule Take 40 mg by mouth in the morning and at bedtime.  ? ondansetron (ZOFRAN) 4 MG tablet TAKE 1 TABLET BY MOUTH EVERY 8 HOURS AS NEEDED FOR NAUSEA AND VOMITING  ? polyethylene glycol (MIRALAX / GLYCOLAX) 17 g packet Take 17 g by mouth daily.  ?  promethazine (PHENERGAN) 25 MG tablet Take 25 mg by mouth every 6 (six) hours as needed for nausea or vomiting.  ? sucralfate (CARAFATE) 1 g tablet TAKE 1 TABLET BY MOUTH FOUR TIMES A DAY AS NEEDED  ? tiZANidine (ZANAFLEX) 4 MG tablet Take 1 tablet (4 mg total) by mouth every 8 (eight) hours as needed for muscle spasms.  ? valsartan (DIOVAN) 80 MG tablet Take 1 tablet (80 mg total) by mouth daily.  ?  ?Allergies:   Sulfamethoxazole, Amitriptyline, Cymbalta [duloxetine hcl], Lactose intolerance (gi), Lexapro [escitalopram oxalate], Topamax [topiramate], and Prednisone  ? ?Social History  ? ?Tobacco Use  ? Smoking status: Former  ?  Types: Cigarettes  ?  Quit date: 08/19/1995  ?  Years since quitting: 26.3  ? Smokeless tobacco: Never  ?Vaping Use  ? Vaping Use: Never used  ?Substance Use Topics  ? Alcohol use: Never  ?  Alcohol/week: 0.0 standard drinks  ? Drug use: Never  ?  ?Family Hx: ?The patient's family history includes Alcohol abuse in her daughter; Allergies in her sister, son, and son; Aneurysm in her paternal grandfather; Arthritis in her brother, son, son, and son; Breast cancer (age of onset: 17) in her paternal grandmother; Breast cancer (age of onset: 68) in her maternal grandmother; Colon cancer in her cousin, maternal aunt, maternal grandmother, and mother; Colon polyps in her father; Diabetes in her mother and paternal grandmother; Heart disease in her  paternal grandfather; Hypertension in her mother, paternal grandfather, and son; Irritable bowel syndrome in her son; Osteoporosis in her son and son; Ovarian cancer in her cousin; Parkinson's disease in her

## 2021-12-22 NOTE — H&P (View-Only) (Signed)
?Cardiology Office Note:   ? ?Date:  12/23/2021  ? ?ID:  [name], DOB [birth year], MRN [id number] ? ?PCP:  Haydee Salter, MD  ?[facility] Providers ?Cardiologist:  Sherren Mocha, MD     ?Referring MD: Haydee Salter, MD  ? ?Chief Complaint:  Follow-up for tricuspid regurgitation, pulmonary hypertensi and Chest Pain ?  ? 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 ?Prior CV Studies: ?ECHO COMPLETE WO IMAGING ENHANCING AGENT 12/04/2021 ?EF 60-65, normal RVSF, moderately elevated PASP, RVSP 52.1, trivial MR, mild to moderate TR, tricuspid regurgitant velocity 3.32 m/s ?   ?LONG TERM MONITOR (3-7 DAYS) INTERPRETATION 01/03/2020 ?Narrative ?1. The basic rhythm is normal sinus with an average HR of 78 bpm ?2. No atrial fibrillation or flutter ?3. No high-grade heart block or pathologic pauses ?4. There are no PVC's and rare supraventricular beats without sustained arrhythmias ?5. There is a short run of PSVT, 9 beats ?Overall benign monitor result ?  ?Echocardiogram 12/05/19 ?EF 60-65, no RWMA, normal RVSF, mildly elevated PASP, RVSP 34.6, mild LAE, trivial MR, mild-moderate TR ?  ?GATED SPECT MYO PERF W/EXERCISE STRESS 1D 07/09/2016 ?Narrative ?? Nuclear stress EF: 83%. ?? The patient walked for 4 minutes of a Bruce protocol. Peak HR of 185 whicih is 97% PMHR. RBBB. No ST or T wave changes. ?? The study is normal. ?? This is a low risk study. ?? The left ventricular ejection fraction is hyperdynamic (>65%). ? ?History of Present Illness:   ?Megan [name] is a 71 y.o. female with the above problem list.  She was last seein in April 2022.  Her last echocardiogram was in April 2021.  Therefore, she was brought back in April 2023 for 2 year f/u echocardiogram.  This demonstrated  mild to mod TR.  Her EF is normal.  She now has mod pulmonary hypertension with RVSP  52.1.  TV regurgitant volume was 2.81 in 2021 and now 3.3 ms.  I asked Dr. Burt Knack to review this who discussed it with Dr. Aundra Dubin.  It was recommended that she have a R cardiac catheterization to evaluate for PAH vs HFpEF.   ? ?She returns to arrange R cardiac catheterization.  She is here alone.  She notes several symptoms over the past month or so.  She has been fatigued.  She has also noted shortness of breath with exertion at times.  She has stopped walking her dog because she is tired.  She has not had orthopnea.  She has slept on 2 pillows secondary to neck pain.  She has not had PND, edema, syncope.  She has had chest discomfort over the past month or so.  She describes substernal sharp type chest pain.  She notes it with emotional stress.  She gets it with exertion at times.  She can also exert herself at times without symptoms.  She has also noted rapid palpitations awakening her from sleep at night with associated diaphoresis.  This may last 5 or 10 minutes before resolving. ?   ?Past Medical History:  ?Diagnosis Date  ? Adrenal adenoma 05/22/2014  ? 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 Allergic state 12/31/2012  ? Anxiety   ? Asthma   ? triggerred by fragrances   ? Barrett esophagus   ? Blind loop syndrome   ? C. difficile diarrhea   ? Cancer [facility])   ?  pt denies   ? Chest pain, atypical 07/14/2011  ? Cold sore 07/17/2016  ? Colon polyp   ? Contact dermatitis 03/23/2012  ? Cystocele, midline   ? Depression   ? Diarrhea   ? Diverticulosis   ? Endometrial hyperplasia 02/27/2006  ? BENIGN ENDO BX ON 02/2007  ? GERD (gastroesophageal reflux disease)   ? Headache 04/16/2015  ? Headache   ? Headache(784.0) 04/15/2012  ? pt denies   ? Heart murmur   ? Hematuria 04/27/2012  ? Hepatic cyst   ? Hiatal hernia   ? Hiatal hernia   ? Hyperlipidemia, mixed 12/17/2014  ? Hypertension   ? IBS (irritable bowel syndrome)   ? Insomnia   ? Leaky heart valve   ? Low back pain 08/15/2013  ? Osteoporosis 03/2017  ? T score -2.5.  Without significant loss from prior studies  ? Pancreatic cyst 04/19/2013  ? Paronychia of great toe, left 06/19/2013  ? Preventative health care 09/11/2014  ? Rectal bleeding 10/10/2011  ? Rectocele   ? 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 Sinusitis acute 02/03/2012  ? Thrush 07/21/2011  ? Tricuspid regurgitation, Mild-Moderate 07/21/2011  ? Moderate by 2 d echocardiogram Echo 11/2021: EF 60-65, no RWMA, normal RVSF, moderately elevated PASP, RVSP 52.1, trivial MR, mild to moderate TR  ? Unspecified hypothyroidism   ? Unspecified menopausal and postmenopausal disorder   ? Uterine prolapse without mention of vaginal wall prolapse   ? Vitamin B12 deficiency   ? Vitamin D deficiency 07/17/2016  ? ?Current Medications: ?Current Meds  ?Medication Sig  ? albuterol (VENTOLIN HFA) 108 (90 Base) MCG/ACT inhaler TAKE 2 PUFFS BY MOUTH EVERY 6 HOURS AS NEEDED FOR WHEEZE OR SHORTNESS OF BREATH  ? ALPRAZolam (XANAX) 1 MG tablet TAKE 1 TABLET BY MOUTH TWICE A DAY AS NEEDED  ? Alum & Mag Hydroxide-Simeth (MYLANTA PO) Take by mouth. PRN  ? Alum Hydroxide-Mag Carbonate (GAVISCON PO) Take by mouth. PRN  ? aspirin EC 81 MG tablet Take 81 mg by mouth daily. Swallow whole.  ? atorvastatin (LIPITOR) 20 MG tablet TAKE 1 TABLET BY MOUTH EVERY DAY  ? Biotin 10 MG TABS Take by mouth.  ? cholecalciferol (VITAMIN D3) 25 MCG (1000 UNIT) tablet Take 5,000 Units by mouth daily.  ? dicyclomine (BENTYL) 10 MG capsule   ? diltiazem (CARDIZEM CD) 180 MG 24 hr capsule TAKE 1 CAPSULE BY MOUTH EVERY DAY  ? estradiol (ESTRACE) 0.1 MG/GM vaginal cream Place 0.5 g vaginally 2 (two) times a week. Place 0.5g nightly for two weeks then twice a week after  ? famotidine-calcium carbonate-magnesium hydroxide (PEPCID COMPLETE) 10-800-165 MG chewable tablet Chew 1 tablet by mouth daily as needed.  ? gabapentin (NEURONTIN) 300 MG capsule TAKE 1 CAPSULE BY MOUTH THREE TIMES A DAY  ? linaclotide (LINZESS) 72 MCG capsule Take 1 capsule (72 mcg total) by mouth daily  before breakfast.  ? metoprolol succinate (TOPROL-XL) 25 MG 24 hr tablet Take 25 mg by mouth daily.  ? metoprolol tartrate (LOPRESSOR) 100 MG tablet TAKE 1 TABLET BY MOUTH 2 HOURS PRIOR TO THE CARDIAC CT  ? Multiple Vitamin (MULTIVITAMIN) tablet Take 1 tablet by mouth daily.  ? omeprazole (PRILOSEC) 40 MG capsule Take 40 mg by mouth in the morning and at bedtime.  ? ondansetron (ZOFRAN) 4 MG tablet TAKE 1 TABLET BY MOUTH EVERY 8 HOURS AS NEEDED FOR NAUSEA AND VOMITING  ? polyethylene glycol (MIRALAX / GLYCOLAX) 17 g packet Take 17 g by mouth daily.  ?  promethazine (PHENERGAN) 25 MG tablet Take 25 mg by mouth every 6 (six) hours as needed for nausea or vomiting.  ? sucralfate (CARAFATE) 1 g tablet TAKE 1 TABLET BY MOUTH FOUR TIMES A DAY AS NEEDED  ? tiZANidine (ZANAFLEX) 4 MG tablet Take 1 tablet (4 mg total) by mouth every 8 (eight) hours as needed for muscle spasms.  ? valsartan (DIOVAN) 80 MG tablet Take 1 tablet (80 mg total) by mouth daily.  ?  ?Allergies:   Sulfamethoxazole, Amitriptyline, Cymbalta [duloxetine hcl], Lactose intolerance (gi), Lexapro [escitalopram oxalate], Topamax [topiramate], and Prednisone  ? ?Social History  ? ?Tobacco Use  ? Smoking status: Former  ?  Types: Cigarettes  ?  Quit date: 08/19/1995  ?  Years since quitting: 26.3  ? Smokeless tobacco: Never  ?Vaping Use  ? Vaping Use: Never used  ?Substance Use Topics  ? Alcohol use: Never  ?  Alcohol/week: 0.0 standard drinks  ? Drug use: Never  ?  ?Family Hx: ?The patient's family history includes Alcohol abuse in her daughter; Allergies in her sister, son, and son; Aneurysm in her paternal grandfather; Arthritis in her brother, son, son, and son; Breast cancer (age of onset: 64) in her paternal grandmother; Breast cancer (age of onset: 36) in her maternal grandmother; Colon cancer in her cousin, maternal aunt, maternal grandmother, and mother; Colon polyps in her father; Diabetes in her mother and paternal grandmother; Heart disease in her  paternal grandfather; Hypertension in her mother, paternal grandfather, and son; Irritable bowel syndrome in her son; Osteoporosis in her son and son; Ovarian cancer in her cousin; Parkinson's disease in her

## 2021-12-23 ENCOUNTER — Encounter: Payer: Self-pay | Admitting: Physician Assistant

## 2021-12-23 ENCOUNTER — Other Ambulatory Visit: Payer: Self-pay | Admitting: Physician Assistant

## 2021-12-23 ENCOUNTER — Ambulatory Visit (INDEPENDENT_AMBULATORY_CARE_PROVIDER_SITE_OTHER): Payer: Medicare HMO

## 2021-12-23 ENCOUNTER — Telehealth (HOSPITAL_COMMUNITY): Payer: Self-pay | Admitting: Emergency Medicine

## 2021-12-23 ENCOUNTER — Ambulatory Visit: Payer: Medicare HMO | Admitting: Physician Assistant

## 2021-12-23 VITALS — BP 160/80 | HR 76 | Ht 66.0 in | Wt 138.0 lb

## 2021-12-23 DIAGNOSIS — R002 Palpitations: Secondary | ICD-10-CM

## 2021-12-23 DIAGNOSIS — I10 Essential (primary) hypertension: Secondary | ICD-10-CM

## 2021-12-23 DIAGNOSIS — E782 Mixed hyperlipidemia: Secondary | ICD-10-CM

## 2021-12-23 DIAGNOSIS — R072 Precordial pain: Secondary | ICD-10-CM

## 2021-12-23 DIAGNOSIS — I071 Rheumatic tricuspid insufficiency: Secondary | ICD-10-CM

## 2021-12-23 DIAGNOSIS — I272 Pulmonary hypertension, unspecified: Secondary | ICD-10-CM

## 2021-12-23 MED ORDER — VALSARTAN 80 MG PO TABS: 80.0000 mg | ORAL_TABLET | Freq: Every day | ORAL | 1 refills | Status: DC

## 2021-12-23 MED ORDER — METOPROLOL TARTRATE 100 MG PO TABS
ORAL_TABLET | ORAL | 0 refills | Status: DC
Start: 2021-12-23 — End: 2021-12-31

## 2021-12-23 NOTE — Assessment & Plan Note (Signed)
She has recent history of rapid palpitations at night that awaken her from sleep.  Question if she is having atrial fibrillation.  Of note, recent TSH was normal. ?? Arrange 14-day ZIO monitor ?

## 2021-12-23 NOTE — Telephone Encounter (Signed)
Reaching out to patient to offer assistance regarding upcoming cardiac imaging study; pt verbalizes understanding of appt date/time, parking situation and where to check in, pre-test NPO status and medications ordered, and verified current allergies; name and call back number provided for further questions should they arise ?Marchia Bond RN Navigator Cardiac Imaging ?[facility] ?[phone] office ?[phone] cell ? ?'100mg'$  metoprolol tartrate  ?Difficult IV  ?Arrival 400 ? ?

## 2021-12-23 NOTE — Assessment & Plan Note (Signed)
She has mod pulmonary hypertension on echocardiogram.  As noted I reviewed her echocardiogram with Dr. Burt Knack who also spoke with Dr. Aundra Dubin.  We plan a R cardiac catheterization to better evaluate pulmonary hypertension.   ?? Schedule R cardiac catheterization with Dr. Burt Knack May 17 ?

## 2021-12-23 NOTE — Assessment & Plan Note (Signed)
Mild to moderate tricuspid regurgitation with regurgitant volume 3.32 m/s on recent echocardiogram. ?

## 2021-12-23 NOTE — Progress Notes (Unsigned)
Enrolled for [employer] to mail a ZIO AT Live Telemetry monitor to patients address on file. ? ?Patient to apply monitor after CT Coronary. ? ?Dr. Burt Knack to read.  ?

## 2021-12-23 NOTE — Assessment & Plan Note (Signed)
She has had a lot of symptoms of chest pain in the past.  Her last stress test was in 2017.  She has had symptoms of dyspnea on exertion, fatigue and chest pain the last month or two.  She has risk factors of hypertension, hyperlipidemia.  She needs a R cardiac catheterization for evaluation of pulmonary hypertension.  She would like to avoid a L cardiac catheterization if possible.  I think a CCTA is reasonable to evaluate her chest pain.  If this suggests significant coronary artery disease, we can change her R cardiac catheterization to a R and L cardiac catheterization.   ?? Schedule CCTA w FFR tomorrow  ?

## 2021-12-23 NOTE — Patient Instructions (Addendum)
Medication Instructions:  ?Your physician has recommended you make the following change in your medication:  ? START Valsartan 80 mg taking 1 daily  ? ? ?*If you need a refill on your cardiac medications before your next appointment, please call your pharmacy* ? ? ?Lab Work: ?TODAY:  BMET  ? ?12/30/21: COME TO THE OFFICE, AFTER 7:15 A.M FOR:  BMET & CBC ? ?If you have labs (blood work) drawn today and your tests are completely normal, you will receive your results only by: ?MyChart Message (if you have MyChart) OR ?A paper copy in the mail ?If you have any lab test that is abnormal or we need to change your treatment, we will call you to review the results. ? ? ?Testing/Procedures: ? ?Your physician has requested that you have cardiac CT. Cardiac computed tomography (CT) is a painless test that uses an x-ray machine to take clear, detailed pictures of your heart. For further information please visit [email or web address].[email or web address]. Please follow instruction BELOW: ? ? ? ?Your cardiac CT will be scheduled at one of the below locations:  ? ?[facility] ?[address] ?[place], [place] [address] ?[phone] ? ?At [facility], please arrive at the [facility] and Children's Entrance (Entrance C2) of [facility] 30 minutes prior to test start time. ?You can use the FREE valet parking offered at entrance C (encouraged to control the heart rate for the test)  ?Proceed to the [facility] Radiology Department (first floor) to check-in and test prep. ? ?All radiology patients and guests should use entrance C2 at [facility], accessed from [facility], even though the hospital's physical address listed is [address] ? ? ? ?Please follow these instructions carefully (unless otherwise directed): ? ? ?On the Night Before the Test: ?Be sure to Drink plenty of water. ?Do not consume any caffeinated/decaffeinated beverages or chocolate 12 hours prior to your test. ?Do not take any  antihistamines 12 hours prior to your test. ? ?On the Day of the Test: ?Drink plenty of water until 1 hour prior to the test. ?Do not eat any food 4 hours prior to the test. ?You may take your regular medications prior to the test.  ?Take metoprolol (Lopressor) 100 MG two hours prior to test.  THIS HAS BEEN SENT TO [facility]  ?FEMALES- please wear underwire-free bra if available, avoid dresses & tight clothing ? ? ? After the Test: ?Drink plenty of water. ?After receiving IV contrast, you may experience a mild flushed feeling. This is normal. ?On occasion, you may experience a mild rash up to 24 hours after the test. This is not dangerous. If this occurs, you can take Benadryl 25 mg and increase your fluid intake. ?If you experience trouble breathing, this can be serious. If it is severe call 911 IMMEDIATELY. If it is mild, please call our office. ?If you take any of these medications: Glipizide/Metformin, Avandament, Glucavance, please do not take 48 hours after completing test unless otherwise instructed. ? ?We will call to schedule your test 2-4 weeks out understanding that some insurance companies will need an authorization prior to the service being performed.  ? ?For non-scheduling related questions, please contact the cardiac imaging nurse navigator should you have any questions/concerns: ?Marchia Bond, Cardiac Imaging Nurse Navigator ?Gordy Clement, Cardiac Imaging Nurse Navigator ?[facility] ?Direct Office Dial: [phone]  ? ?For scheduling needs, including cancellations and rescheduling, please call [place], [phone]. ? ? ?Your physician has requested  that you have a cardiac catheterization. Cardiac catheterization is used to diagnose and/or treat various heart conditions. Doctors may recommend this procedure for a number of different reasons. The most common reason is to evaluate chest pain. Chest pain can be a symptom of coronary artery disease (CAD), and cardiac  catheterization can show whether plaque is narrowing or blocking your heart?s arteries. This procedure is also used to evaluate the valves, as well as measure the blood flow and oxygen levels in different parts of your heart. For further information please visit [email or web address]. Please follow instruction sheet, BELOW: ? ? ?[place] ?[place] OFFICE ?[place], [address] ?[place] [address] ?Dept: [phone] ?Loc: [phone] ? ?ZANITA MILLMAN  12/23/2021 ? ?You are scheduled for a Cardiac Catheterization on Wednesday, May 17 with Dr. Sherren Mocha. ? ?1. Please arrive at the Main Entrance A at [facility]: [address] [place], [place] [address] at 11:30 A.M. (This time is two hours before your procedure to ensure your preparation). Free valet parking service is available.  ? ?Special note: Every effort is made to have your procedure done on time. Please understand that emergencies sometimes delay scheduled procedures. ? ?2. Diet: Do not eat solid foods after midnight.  You may have clear liquids until 5 AM upon the day of the procedure. ? ?3. Labs: You will need to have blood drawn on , TODAY AND 5/15 ? ?4. Medication instructions in preparation for your procedure: ? ? Contrast Allergy: No ? ? ?On the morning of your procedure, take Aspirin and any morning medicines NOT listed above.  You may use sips of water. ? ?5. Plan to go home the same day, you will only stay overnight if medically necessary. ?6. You MUST have a responsible adult to drive you home. ?7. An adult MUST be with you the first 24 hours after you arrive home. ?8. Bring a current list of your medications, and the last time and date medication taken. ?9. Bring ID and current insurance cards. ?10.Please wear clothes that are easy to get on and off and wear slip-on shoes. ? ?Thank you for allowing [place] to care for you! ?  -- [facility] ? ? ? ?ZIO AT Long term monitor-Live Telemetry ? ?Your physician has requested you wear a ZIO patch monitor for 14 days.   DO NOT PUT ON UNTIL AFTER THE CATH  ?This is a single patch monitor. [employer] supplies one patch monitor per enrollment. Additional  ?stickers are not available.  ?Please do not apply patch if you will be having a Nuclear Stress Test, Echocardiogram, Cardiac CT, MRI,  ?or Chest Xray during the period you would be wearing the monitor. The patch cannot be worn during  ?these tests. You cannot remove and re-apply the ZIO AT patch monitor.  ?Your ZIO patch monitor will be mailed 3 day USPS to your address on file. It may take 3-5 days to  ?receive your monitor after you have been enrolled.  ?Once you have received your monitor, please review the enclosed instructions. Your monitor has  ?already been registered assigning a specific monitor serial # to you.  ? ?Billing and Patient Assistance Program information ? ?[employer] has been supplied with any insurance information on record for billing. ?[employer] offers a sliding scale Patient Assistance Program for patients without insurance, or whose  ?insurance does not completely cover the cost of the ZIO patch monitor.  You must apply for the  ?Patient Assistance Program to qualify for the discounted rate. To apply, call [employer] at [phone],  ?select option 4, select option 2 , ask to apply for the Patient Assistance Program, (you can request an  ?interpreter if needed). [employer] will ask your household income and how many people are in your  ?household. [employer] will quote your out-of-pocket cost based on this information. They will also be able  ?to set up a 12 month interest free payment plan if needed. ? ?Applying the monitor  ? ?Shave hair from upper left chest.  ?Hold the abrader disc by orange tab. Rub the abrader in 40 strokes over left upper chest as indicated in  ?your monitor instructions.  ?Clean area with 4 enclosed  alcohol pads. Use all pads to ensure the area is cleaned thoroughly. Let  ?dry.  ?Apply patch as indicated in monitor instructions. Patch will be placed under collarbone on left side of  ?chest with arrow pointing upward.  ?Rub pa

## 2021-12-23 NOTE — Assessment & Plan Note (Addendum)
BP above target.  Repeat by me 160/80.  She has been seen by neurology recently.  An MRI has demonstrated a small remote lacunar infarct as well as evidence of chronic ischemic changes. ?? Continue metoprolol succinate 25 mg daily, Cardizem CD 180 mg daily ?? Start valsartan 80 mg daily ?? BMET 2 weeks ?? Patient to monitor blood pressures at home and send readings in 2 to 3 weeks ?

## 2021-12-23 NOTE — Assessment & Plan Note (Signed)
Recent LDL optimal at 72.  Continue Lipitor 20 mg daily. ?

## 2021-12-24 ENCOUNTER — Ambulatory Visit (HOSPITAL_COMMUNITY)
Admission: RE | Admit: 2021-12-24 | Discharge: 2021-12-24 | Disposition: A | Discharge status: Home / Self Care | Payer: Medicare HMO | Source: Ambulatory Visit | Attending: Physician Assistant | Admitting: Physician Assistant

## 2021-12-24 DIAGNOSIS — R931 Abnormal findings on diagnostic imaging of heart and coronary circulation: Secondary | ICD-10-CM | POA: Insufficient documentation

## 2021-12-24 DIAGNOSIS — R002 Palpitations: Secondary | ICD-10-CM | POA: Insufficient documentation

## 2021-12-24 DIAGNOSIS — R072 Precordial pain: Secondary | ICD-10-CM | POA: Insufficient documentation

## 2021-12-24 DIAGNOSIS — I2510 Atherosclerotic heart disease of native coronary artery without angina pectoris: Secondary | ICD-10-CM | POA: Diagnosis not present

## 2021-12-24 LAB — BASIC METABOLIC PANEL
BUN/Creatinine Ratio: 10 — ABNORMAL LOW (ref 12–28)
BUN: 7 mg/dL — ABNORMAL LOW (ref 8–27)
CO2: 23 mmol/L (ref 20–29)
Calcium: 9.5 mg/dL (ref 8.7–10.3)
Chloride: 99 mmol/L (ref 96–106)
Creatinine, Ser: 0.68 mg/dL (ref 0.57–1.00)
Glucose: 104 mg/dL — ABNORMAL HIGH (ref 70–99)
Potassium: 4.7 mmol/L (ref 3.5–5.2)
Sodium: 137 mmol/L (ref 134–144)
eGFR: 93 mL/min/{1.73_m2} (ref >59)

## 2021-12-24 MED ORDER — NITROGLYCERIN 0.4 MG SL SUBL
0.8000 mg | SUBLINGUAL_TABLET | Freq: Once | SUBLINGUAL | Status: AC
  Administered 2021-12-24: 0.8 mg via SUBLINGUAL

## 2021-12-24 MED ORDER — IOHEXOL 350 MG/ML SOLN
95.0000 mL | Freq: Once | INTRAVENOUS | Status: AC | PRN
  Administered 2021-12-24: 95 mL via INTRAVENOUS

## 2021-12-24 MED ORDER — NITROGLYCERIN 0.4 MG SL SUBL
SUBLINGUAL_TABLET | SUBLINGUAL | Status: AC
  Filled 2021-12-24: qty 2

## 2021-12-24 NOTE — Progress Notes (Signed)
Nurse at bedside assessing for PIV placement. Notified nurse to reconsult VAST if needing VAST for placement. Fran Lowes, RN VAST ?

## 2021-12-25 ENCOUNTER — Other Ambulatory Visit: Payer: Self-pay | Admitting: Family Medicine

## 2021-12-25 ENCOUNTER — Encounter: Payer: Self-pay | Admitting: Neurology

## 2021-12-25 ENCOUNTER — Other Ambulatory Visit: Payer: Self-pay | Admitting: Physician Assistant

## 2021-12-25 DIAGNOSIS — F341 Dysthymic disorder: Secondary | ICD-10-CM

## 2021-12-26 ENCOUNTER — Ambulatory Visit (HOSPITAL_BASED_OUTPATIENT_CLINIC_OR_DEPARTMENT_OTHER)
Admission: RE | Admit: 2021-12-26 | Discharge: 2021-12-26 | Disposition: A | Discharge status: Home / Self Care | Payer: Medicare HMO | Source: Ambulatory Visit | Attending: Internal Medicine | Admitting: Internal Medicine

## 2021-12-26 ENCOUNTER — Other Ambulatory Visit: Payer: Self-pay | Admitting: Internal Medicine

## 2021-12-26 ENCOUNTER — Telehealth: Payer: Self-pay

## 2021-12-26 ENCOUNTER — Ambulatory Visit (HOSPITAL_COMMUNITY)
Admission: RE | Admit: 2021-12-26 | Discharge: 2021-12-26 | Disposition: A | Discharge status: Home / Self Care | Payer: Medicare HMO | Source: Ambulatory Visit | Attending: Internal Medicine | Admitting: Internal Medicine

## 2021-12-26 DIAGNOSIS — R931 Abnormal findings on diagnostic imaging of heart and coronary circulation: Secondary | ICD-10-CM

## 2021-12-26 DIAGNOSIS — R002 Palpitations: Secondary | ICD-10-CM | POA: Diagnosis not present

## 2021-12-26 DIAGNOSIS — E782 Mixed hyperlipidemia: Secondary | ICD-10-CM

## 2021-12-26 DIAGNOSIS — R072 Precordial pain: Secondary | ICD-10-CM | POA: Diagnosis not present

## 2021-12-26 DIAGNOSIS — I2510 Atherosclerotic heart disease of native coronary artery without angina pectoris: Secondary | ICD-10-CM | POA: Diagnosis not present

## 2021-12-26 MED ORDER — ATORVASTATIN CALCIUM 40 MG PO TABS: 40.0000 mg | ORAL_TABLET | Freq: Every day | ORAL | 3 refills | Status: DC

## 2021-12-26 NOTE — Telephone Encounter (Signed)
The patient has been notified of the result and verbalized understanding.  All questions (if any) were answered. ?Antonieta Iba, RN 12/26/2021 5:11 PM  ?Patient will increase atorvastatin to 40 mg daily. She will let us know if she cannot tolerate it. Repeat labs have been scheduled. She will monitor her BP for the next two weeks and call us with her readings. She is aware to proceed with right heart cath as scheduled.  ?

## 2021-12-26 NOTE — Telephone Encounter (Signed)
-----   Message from Liliane Shi, [place] sent at 12/26/2021  5:03 PM EDT ----- ?CCTA with CAC score 115 (72nd percentile).  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  There was minimal plaque in the RCA.  FFR analysis shows no significant stenosis. ?CT findings suggest chest pain is not related to CAD. ?The patient needs aggressive modification of risk factors given non-obs CAD. ?Goal LDL is now < 70.  Recent LDL was > 70. ?Goal BP < 130/80. ?PLAN:  ?-Proceed with R cardiac catheterization as planned at last office visit. ?-Continue ASA 81 mg once daily  ?-Increase Atorvastatin to 40 mg once daily.  Repeat Lipids, LFTs in 3 mos. ?-Monitor BP with recent med changes and report BP readings in 2 weeks. ?Polak Dopp, PA-C    ?12/26/2021 4:48 PM   ?

## 2021-12-30 ENCOUNTER — Other Ambulatory Visit: Payer: Medicare HMO | Admitting: *Deleted

## 2021-12-30 DIAGNOSIS — I10 Essential (primary) hypertension: Secondary | ICD-10-CM | POA: Diagnosis not present

## 2021-12-30 DIAGNOSIS — R072 Precordial pain: Secondary | ICD-10-CM

## 2021-12-30 DIAGNOSIS — I272 Pulmonary hypertension, unspecified: Secondary | ICD-10-CM | POA: Diagnosis not present

## 2021-12-30 DIAGNOSIS — I071 Rheumatic tricuspid insufficiency: Secondary | ICD-10-CM

## 2021-12-30 DIAGNOSIS — E782 Mixed hyperlipidemia: Secondary | ICD-10-CM | POA: Diagnosis not present

## 2021-12-30 LAB — BASIC METABOLIC PANEL
BUN/Creatinine Ratio: 11 — ABNORMAL LOW (ref 12–28)
BUN: 7 mg/dL — ABNORMAL LOW (ref 8–27)
CO2: 28 mmol/L (ref 20–29)
Calcium: 9.6 mg/dL (ref 8.7–10.3)
Chloride: 100 mmol/L (ref 96–106)
Creatinine, Ser: 0.65 mg/dL (ref 0.57–1.00)
Glucose: 100 mg/dL — ABNORMAL HIGH (ref 70–99)
Potassium: 4.8 mmol/L (ref 3.5–5.2)
Sodium: 135 mmol/L (ref 134–144)
eGFR: 94 mL/min/{1.73_m2} (ref >59)

## 2021-12-30 LAB — CBC
Hematocrit: 38.7 % (ref 34.0–46.6)
Hemoglobin: 13.2 g/dL (ref 11.1–15.9)
MCH: 31.2 pg (ref 26.6–33.0)
MCHC: 34.1 g/dL (ref 31.5–35.7)
MCV: 92 fL (ref 79–97)
Platelets: 340 10*3/uL (ref 150–450)
RBC: 4.23 x10E6/uL (ref 3.77–5.28)
RDW: 12.8 % (ref 11.7–15.4)
WBC: 8.7 10*3/uL (ref 3.4–10.8)

## 2021-12-31 ENCOUNTER — Ambulatory Visit (INDEPENDENT_AMBULATORY_CARE_PROVIDER_SITE_OTHER): Payer: Medicare HMO | Admitting: Family Medicine

## 2021-12-31 ENCOUNTER — Telehealth: Payer: Self-pay | Admitting: *Deleted

## 2021-12-31 VITALS — BP 124/70 | HR 75 | Temp 97.1°F | Ht 66.0 in | Wt 137.4 lb

## 2021-12-31 DIAGNOSIS — F418 Other specified anxiety disorders: Secondary | ICD-10-CM

## 2021-12-31 DIAGNOSIS — I272 Pulmonary hypertension, unspecified: Secondary | ICD-10-CM

## 2021-12-31 DIAGNOSIS — J301 Allergic rhinitis due to pollen: Secondary | ICD-10-CM

## 2021-12-31 DIAGNOSIS — I10 Essential (primary) hypertension: Secondary | ICD-10-CM | POA: Diagnosis not present

## 2021-12-31 DIAGNOSIS — J309 Allergic rhinitis, unspecified: Secondary | ICD-10-CM | POA: Insufficient documentation

## 2021-12-31 MED ORDER — DESVENLAFAXINE SUCCINATE ER 50 MG PO TB24: 50.0000 mg | ORAL_TABLET | Freq: Every day | ORAL | 3 refills | Status: DC

## 2021-12-31 MED ORDER — FLUTICASONE PROPIONATE 50 MCG/ACT NA SUSP: 2.0000 | Freq: Every day | NASAL | 6 refills | Status: DC

## 2021-12-31 NOTE — Progress Notes (Signed)
?[facility] ?[facility] ?[place] ?[place] [address] ?Dept: [phone] ?Dept Fax: [phone] ? ?Office Visit ? ?Subjective:  ? ? Patient ID: Megan [name], female    DOB: 05/13/1951, 71 y.o..   MRN: [id number] ? ?Chief Complaint  ?Patient presents with  ? Follow-up  ?  Wants to discuss health issues and having runny nose/sneezing x 1 month.  Has been taking Mucinex and Claritin.   ? ? ?History of Present Illness: ? ?Patient is in today for several reasons. ? ?Megan [name] is scheduled tomorrow for a right heart catheterization. She states she really doesn't understand why this is being [name]. She is worried about the procedure. ? ?Megan [name] states she has never really had an issue with allergies. However, this spring she has had considerable rhinorrhea and nasal congestion. She did try taking Claritin for about 4 days, but felt it didn't help. She was on Flonase in the past, but not currently. She feels her nose is running all of the time. ? ?Megan [name] has a history of anxiety and depression. She is currently managed on PRN Xanax. She states her frustration level is very high. She relates tis to marital strife after 52 years of marriage. She finds her husband's controlling nature to be a bit unbearable at times. She states she was on antidepressants for many years, but these were either no longer effective or causing her side effects (the record indicates prior reactions to Lexapro and Cymbalta. She states her prior PCP (Megan [name]) had performed a pharmacogenetic assay a determined that she would potentially best respond to desvenlafaxine (Pristiq) or Levomilnacipran (Fetzima). However, she was never started on these before he left practice. ? ?Megan [name] has a history of chronic left-sided headache. At her last visit with Megan [name], she had a trigger point injection. Megan [name] notes this resolved her headache and she is quite  pleased. ? ?Past Medical History: ?Patient Active Problem List  ? Diagnosis Date Noted  ? Allergic rhinitis 12/31/2021  ? Pulmonary hypertension, unspecified ([place]) 12/22/2021  ? Mild cognitive impairment 12/18/2021  ? Genitourinary syndrome of menopause 10/25/2021  ? Arthralgia of left temporomandibular joint 04/15/2021  ? Lacunar infarction ([place]) 02/07/2021  ? Adrenal cortical nodule ([place]) 02/05/2021  ? Bladder rupture 12/10/2020  ? Urinary retention 11/21/2020  ? Cystocele with prolapse 11/15/2020  ? Asthma 11/05/2020  ? Uterine prolapse 11/05/2020  ? Episodic cluster headache, not intractable 09/14/2020  ? Chronic left-sided headache 08/09/2020  ? Cerebral vascular disease 08/09/2020  ? Depression with anxiety 08/09/2020  ? 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 Closed fracture of right distal radius 01/12/2019  ? Moderate obstructive sleep apnea 05/21/2017  ? Abdominal aortic atherosclerosis ([place]) 07/16/2016  ? Renal artery stenosis ([place]) 06/12/2016  ? Chronic low back pain 08/24/2015  ? Precordial pain 06/04/2015  ? Hyperlipidemia, mixed 12/17/2014  ? Hepatic cyst 05/22/2014  ? Insomnia due to other mental disorder 03/10/2014  ? Pancreatic cyst 04/19/2013  ? Osteoporosis 09/07/2012  ? 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 Palpitations 08/29/2011  ? Tricuspid regurgitation, Mild-Moderate 07/21/2011  ? Mitral regurgitation, Trivial 07/21/2011  ? Gastritis 06/17/2011  ? Endometrial hyperplasia   ? Diverticulosis   ? IBS (irritable bowel syndrome) with constipation 04/24/2011  ? Hiatal hernia 03/04/2011  ? Basal cell carcinoma (BCC) of face 11/15/2009  ? 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 Essential hypertension 01/04/2009  ? ?Past Surgical History:  ?Procedure Laterality Date  ?  APPENDECTOMY    ? endoscopic hemoclip    ? eye surgery Left   ? lens implant  ? HYSTEROSCOPY    ? POLYP  ? OPEN REDUCTION INTERNAL FIXATION (ORIF) DISTAL RADIAL FRACTURE Left 01/17/2019  ? Procedure: LEFT OPEN  REDUCTION INTERNAL FIXATION (ORIF) DISTAL RADIUS FRACTURE;  Surgeon: Daryll Brod, MD;  Location: [facility];  Service: Orthopedics;  Laterality: Left;  AXILLARY BLOCK  ? PERIPHERAL VASCULAR CATHETERIZATION Left 07/24/2016  ? Procedure: Renal Angiography;  Surgeon: Conrad Lake Arrowhead, MD;  Location: [facility];  Service: Cardiovascular;  Laterality: Left;  ? ROBOTIC ASSISTED BILATERAL SALPINGO OOPHERECTOMY  11/15/2020  ? ROBOTIC ASSISTED LAPAROSCOPIC SACROCOLPOPEXY Bilateral 11/15/2020  ? Procedure: XI ROBOTIC ASSISTED LAPAROSCOPIC SACROCOLPOPEXY AND SUPRACERVICIAL HYSTERECTOMY BILATERAL SALPINGO-OOPHERECTOMY;  Surgeon: Ardis Hughs, MD;  Location: [facility];  Service: Urology;  Laterality: Bilateral;  REQUESTING 4.5 HRS  ? ROBOTIC ASSISTED SUPRACERVICAL HYSTERECTOMY  11/15/2020  ? UPPER GI ENDOSCOPY    ? UTERINE FIBROID SURGERY    ? ?Family History  ?Problem Relation Age of Onset  ? Colon cancer Mother   ? Diabetes Mother   ? Hypertension Mother   ? Colon polyps Father   ? Parkinson's disease Father   ? Colon cancer Maternal Grandmother   ? Breast cancer Maternal Grandmother 13  ? Diabetes Paternal Grandmother   ? Breast cancer Paternal Grandmother 78  ? Allergies Sister   ? Arthritis Brother   ?     b/l hip replace  ? Alcohol abuse Daughter   ? Arthritis Son   ? Hypertension Son   ? Allergies Son   ? Osteoporosis Son   ? Osteoporosis Son   ? Arthritis Son   ?     back disease, DDD  ? Allergies Son   ? Arthritis Son   ? Irritable bowel syndrome Son   ?     RSD  ? Hypertension Paternal Grandfather   ? Heart disease Paternal Grandfather   ? Aneurysm Paternal Grandfather   ? Colon cancer Maternal Aunt   ? Colon cancer Cousin   ?     X3  ? Ovarian cancer Cousin   ? ?Outpatient Medications Prior to Visit  ?Medication Sig Dispense Refill  ? albuterol (VENTOLIN HFA) 108 (90 Base) MCG/ACT inhaler TAKE 2 PUFFS BY MOUTH EVERY 6 HOURS AS NEEDED FOR WHEEZE OR SHORTNESS OF BREATH 8.5 each 1  ? ALPRAZolam  (XANAX) 1 MG tablet TAKE 1 TABLET BY MOUTH TWICE A DAY AS NEEDED 30 tablet 1  ? Alum & Mag Hydroxide-Simeth (MYLANTA PO) Take by mouth. PRN    ? Alum Hydroxide-Mag Carbonate (GAVISCON PO) Take by mouth. PRN    ? aspirin EC 81 MG tablet Take 81 mg by mouth daily. Swallow whole.    ? atorvastatin (LIPITOR) 40 MG tablet Take 1 tablet (40 mg total) by mouth daily. 90 tablet 3  ? Biotin 10 MG TABS Take by mouth.    ? cholecalciferol (VITAMIN D3) 25 MCG (1000 UNIT) tablet Take 5,000 Units by mouth daily.    ? dicyclomine (BENTYL) 10 MG capsule     ? diltiazem (CARDIZEM CD) 180 MG 24 hr capsule TAKE 1 CAPSULE BY MOUTH EVERY DAY 90 capsule 3  ? estradiol (ESTRACE) 0.1 MG/GM vaginal cream Place 0.5 g vaginally 2 (two) times a week. Place 0.5g nightly for two weeks then twice a week after 30 g 11  ? famotidine-calcium carbonate-magnesium hydroxide (PEPCID COMPLETE) 10-800-165 MG chewable tablet Chew 1  tablet by mouth daily as needed.    ? gabapentin (NEURONTIN) 300 MG capsule TAKE 1 CAPSULE BY MOUTH THREE TIMES A DAY 90 capsule 5  ? linaclotide (LINZESS) 72 MCG capsule Take 1 capsule (72 mcg total) by mouth daily before breakfast. 30 capsule 2  ? metoprolol succinate (TOPROL-XL) 25 MG 24 hr tablet Take 25 mg by mouth daily.    ? Multiple Vitamin (MULTIVITAMIN) tablet Take 1 tablet by mouth daily.    ? omeprazole (PRILOSEC) 40 MG capsule Take 40 mg by mouth in the morning and at bedtime.    ? ondansetron (ZOFRAN) 4 MG tablet TAKE 1 TABLET BY MOUTH EVERY 8 HOURS AS NEEDED FOR NAUSEA AND VOMITING 20 tablet 1  ? polyethylene glycol (MIRALAX / GLYCOLAX) 17 g packet Take 17 g by mouth daily. 14 each 0  ? promethazine (PHENERGAN) 25 MG tablet Take 25 mg by mouth every 6 (six) hours as needed for nausea or vomiting.    ? tiZANidine (ZANAFLEX) 4 MG tablet Take 1 tablet (4 mg total) by mouth every 8 (eight) hours as needed for muscle spasms. 30 tablet 6  ? valsartan (DIOVAN) 80 MG tablet Take 1 tablet (80 mg total) by mouth daily. 90  tablet 1  ? metoprolol tartrate (LOPRESSOR) 100 MG tablet TAKE 1 TABLET BY MOUTH 2 HOURS PRIOR TO THE CARDIAC CT 1 tablet 0  ? sucralfate (CARAFATE) 1 g tablet TAKE 1 TABLET BY MOUTH FOUR TIMES A DAY AS NEEDED    ? ?No

## 2021-12-31 NOTE — Telephone Encounter (Addendum)
Right Heart Cath scheduled at [facility] for: Wednesday Jan 01, 2022 1:30 PM ?Arrival time and place: [facility] Main Entrance A at: 11:30 AM ? ? ?Nothing to eat after midnight prior to procedure, clear liquids until 5 AM day of procedure. ? ?Medication instructions: ?-Usual morning medications can be taken with sips of water. ? ?Confirmed patient has responsible adult to drive home post procedure and be with patient first 24 hours after arriving home. ? ?Patient reports no new symptoms concerning for COVID-19/no exposure to COVID-19 in the past 10 days. ? ?Reviewed procedure instructions with patient.  ?

## 2022-01-01 ENCOUNTER — Other Ambulatory Visit: Payer: Self-pay

## 2022-01-01 ENCOUNTER — Encounter (HOSPITAL_COMMUNITY)
Admission: RE | Disposition: A | Discharge status: Home / Self Care | Payer: Medicare HMO | Source: Home / Self Care | Attending: Cardiovascular Disease

## 2022-01-01 ENCOUNTER — Ambulatory Visit (HOSPITAL_COMMUNITY)
Admission: RE | Admit: 2022-01-01 | Discharge: 2022-01-01 | Disposition: A | Discharge status: Home / Self Care | Payer: Medicare HMO | Attending: Cardiovascular Disease | Admitting: Cardiovascular Disease

## 2022-01-01 DIAGNOSIS — I071 Rheumatic tricuspid insufficiency: Secondary | ICD-10-CM | POA: Diagnosis not present

## 2022-01-01 DIAGNOSIS — Z79899 Other long term (current) drug therapy: Secondary | ICD-10-CM | POA: Diagnosis not present

## 2022-01-01 DIAGNOSIS — R072 Precordial pain: Secondary | ICD-10-CM | POA: Insufficient documentation

## 2022-01-01 DIAGNOSIS — I272 Pulmonary hypertension, unspecified: Secondary | ICD-10-CM | POA: Diagnosis not present

## 2022-01-01 DIAGNOSIS — R002 Palpitations: Secondary | ICD-10-CM | POA: Diagnosis not present

## 2022-01-01 DIAGNOSIS — E785 Hyperlipidemia, unspecified: Secondary | ICD-10-CM | POA: Diagnosis not present

## 2022-01-01 DIAGNOSIS — I10 Essential (primary) hypertension: Secondary | ICD-10-CM | POA: Diagnosis not present

## 2022-01-01 DIAGNOSIS — E782 Mixed hyperlipidemia: Secondary | ICD-10-CM | POA: Diagnosis not present

## 2022-01-01 HISTORY — PX: RIGHT HEART CATH: CATH118263

## 2022-01-01 LAB — POCT I-STAT EG7
Acid-Base Excess: 1 mmol/L (ref 0.0–2.0)
Acid-Base Excess: 2 mmol/L (ref 0.0–2.0)
Bicarbonate: 26.8 mmol/L (ref 20.0–28.0)
Bicarbonate: 27.6 mmol/L (ref 20.0–28.0)
Calcium, Ion: 1.24 mmol/L (ref 1.15–1.40)
Calcium, Ion: 1.25 mmol/L (ref 1.15–1.40)
HCT: 38 % (ref 36.0–46.0)
HCT: 39 % (ref 36.0–46.0)
Hemoglobin: 12.9 g/dL (ref 12.0–15.0)
Hemoglobin: 13.3 g/dL (ref 12.0–15.0)
O2 Saturation: 76 %
O2 Saturation: 76 %
Potassium: 3.9 mmol/L (ref 3.5–5.1)
Potassium: 3.9 mmol/L (ref 3.5–5.1)
Sodium: 141 mmol/L (ref 135–145)
Sodium: 141 mmol/L (ref 135–145)
TCO2: 28 mmol/L (ref 22–32)
TCO2: 29 mmol/L (ref 22–32)
pCO2, Ven: 44.7 mmHg (ref 44–60)
pCO2, Ven: 46.3 mmHg (ref 44–60)
pH, Ven: 7.383 (ref 7.25–7.43)
pH, Ven: 7.386 (ref 7.25–7.43)
pO2, Ven: 42 mmHg (ref 32–45)
pO2, Ven: 42 mmHg (ref 32–45)

## 2022-01-01 SURGERY — RIGHT HEART CATH

## 2022-01-01 MED ORDER — ONDANSETRON HCL 4 MG/2ML IJ SOLN: 4.0000 mg | Freq: Four times a day (QID) | INTRAMUSCULAR | Status: DC | PRN

## 2022-01-01 MED ORDER — HEPARIN (PORCINE) IN NACL 1000-0.9 UT/500ML-% IV SOLN
INTRAVENOUS | Status: AC
  Filled 2022-01-01: qty 500

## 2022-01-01 MED ORDER — LABETALOL HCL 5 MG/ML IV SOLN: 10.0000 mg | INTRAVENOUS | Status: DC | PRN

## 2022-01-01 MED ORDER — MIDAZOLAM HCL 2 MG/2ML IJ SOLN
INTRAMUSCULAR | Status: AC
  Filled 2022-01-01: qty 2

## 2022-01-01 MED ORDER — SODIUM CHLORIDE 0.9% FLUSH: 3.0000 mL | Freq: Two times a day (BID) | INTRAVENOUS | Status: DC

## 2022-01-01 MED ORDER — FENTANYL CITRATE (PF) 100 MCG/2ML IJ SOLN
INTRAMUSCULAR | Status: AC
Start: 2022-01-01 — End: ?
  Filled 2022-01-01: qty 2

## 2022-01-01 MED ORDER — FENTANYL CITRATE (PF) 100 MCG/2ML IJ SOLN
INTRAMUSCULAR | Status: DC | PRN
  Administered 2022-01-01: 25 ug via INTRAVENOUS

## 2022-01-01 MED ORDER — LIDOCAINE HCL (PF) 1 % IJ SOLN
INTRAMUSCULAR | Status: DC | PRN
  Administered 2022-01-01: 2 mL

## 2022-01-01 MED ORDER — LIDOCAINE HCL (PF) 1 % IJ SOLN
INTRAMUSCULAR | Status: AC
  Filled 2022-01-01: qty 30

## 2022-01-01 MED ORDER — SODIUM CHLORIDE 0.9% FLUSH: 3.0000 mL | INTRAVENOUS | Status: DC | PRN

## 2022-01-01 MED ORDER — ACETAMINOPHEN 325 MG PO TABS: 650.0000 mg | ORAL_TABLET | ORAL | Status: DC | PRN

## 2022-01-01 MED ORDER — SODIUM CHLORIDE 0.9 % IV SOLN: 250.0000 mL | INTRAVENOUS | Status: DC | PRN

## 2022-01-01 MED ORDER — HYDRALAZINE HCL 20 MG/ML IJ SOLN: 10.0000 mg | INTRAMUSCULAR | Status: DC | PRN

## 2022-01-01 MED ORDER — HEPARIN (PORCINE) IN NACL 1000-0.9 UT/500ML-% IV SOLN
INTRAVENOUS | Status: DC | PRN
  Administered 2022-01-01: 500 mL

## 2022-01-01 MED ORDER — SODIUM CHLORIDE 0.9 % IV SOLN: INTRAVENOUS | Status: DC

## 2022-01-01 MED ORDER — MIDAZOLAM HCL 2 MG/2ML IJ SOLN
INTRAMUSCULAR | Status: DC | PRN
  Administered 2022-01-01: 1 mg via INTRAVENOUS

## 2022-01-01 SURGICAL SUPPLY — 8 items
CATH BALLN WEDGE 5F 110CM (CATHETERS) ×1 IMPLANT
PACK CARDIAC CATHETERIZATION (CUSTOM PROCEDURE TRAY) ×1 IMPLANT
PROTECTION STATION PRESSURIZED (MISCELLANEOUS) ×2
SHEATH GLIDE SLENDER 4/5FR (SHEATH) ×1 IMPLANT
STATION PROTECTION PRESSURIZED (MISCELLANEOUS) IMPLANT
TRANSDUCER W/STOPCOCK (MISCELLANEOUS) ×1 IMPLANT
TUBING ART PRESS 72  MALE/FEM (TUBING) ×2
TUBING ART PRESS 72 MALE/FEM (TUBING) IMPLANT

## 2022-01-01 NOTE — Interval H&P Note (Signed)
History and Physical Interval Note: ? ?01/01/2022 ?2:16 PM ? ?BETTINA WARN  has presented today for surgery, with the diagnosis of hp.  The various methods of treatment have been discussed with the patient and family. After consideration of risks, benefits and other options for treatment, the patient has consented to  Procedure(s): ?RIGHT HEART CATH (N/A) as a surgical intervention.  The patient's history has been reviewed, patient examined, no change in status, stable for surgery.  I have reviewed the patient's chart and labs.  Questions were answered to the patient's satisfaction.   ? ? ?Sherren Mocha ? ? ?

## 2022-01-02 ENCOUNTER — Encounter (HOSPITAL_COMMUNITY): Payer: Self-pay | Admitting: Cardiovascular Disease

## 2022-01-02 DIAGNOSIS — R002 Palpitations: Secondary | ICD-10-CM | POA: Diagnosis not present

## 2022-01-03 DIAGNOSIS — E782 Mixed hyperlipidemia: Secondary | ICD-10-CM | POA: Diagnosis not present

## 2022-01-03 DIAGNOSIS — I10 Essential (primary) hypertension: Secondary | ICD-10-CM | POA: Diagnosis not present

## 2022-01-03 DIAGNOSIS — I071 Rheumatic tricuspid insufficiency: Secondary | ICD-10-CM | POA: Diagnosis not present

## 2022-01-03 DIAGNOSIS — I272 Pulmonary hypertension, unspecified: Secondary | ICD-10-CM | POA: Diagnosis not present

## 2022-01-04 ENCOUNTER — Telehealth: Payer: Medicare HMO | Admitting: Nurse Practitioner

## 2022-01-04 DIAGNOSIS — R399 Unspecified symptoms and signs involving the genitourinary system: Secondary | ICD-10-CM

## 2022-01-04 MED ORDER — CEPHALEXIN 500 MG PO CAPS
500.0000 mg | ORAL_CAPSULE | Freq: Two times a day (BID) | ORAL | 0 refills | Status: AC
Start: 2022-01-04 — End: 2022-01-11

## 2022-01-04 NOTE — Progress Notes (Signed)

## 2022-01-04 NOTE — Progress Notes (Signed)
I have spent 5 minutes in review of e-visit questionnaire, review and updating patient chart, medical decision making and response to patient.  ° °Wofford Stratton W Woodie Degraffenreid, NP ° °  °

## 2022-01-06 ENCOUNTER — Telehealth: Payer: Self-pay | Admitting: Family Medicine

## 2022-01-06 NOTE — Telephone Encounter (Signed)
Left message for patient to call back and schedule Medicare Annual Wellness Visit (AWV).   Please offer to do virtually or by telephone.  Left office number and my jabber #[phone].  Last AWV:06/30/2018  Please schedule at anytime with Nurse Health Advisor.   

## 2022-01-08 ENCOUNTER — Ambulatory Visit: Payer: Medicare HMO | Admitting: Physician Assistant

## 2022-01-14 MED ORDER — VALSARTAN 80 MG PO TABS: 40.0000 mg | ORAL_TABLET | Freq: Every day | ORAL | 1 refills | Status: DC

## 2022-01-15 DIAGNOSIS — H40023 Open angle with borderline findings, high risk, bilateral: Secondary | ICD-10-CM | POA: Diagnosis not present

## 2022-01-15 DIAGNOSIS — H40033 Anatomical narrow angle, bilateral: Secondary | ICD-10-CM | POA: Diagnosis not present

## 2022-01-15 DIAGNOSIS — Z961 Presence of intraocular lens: Secondary | ICD-10-CM | POA: Diagnosis not present

## 2022-01-15 DIAGNOSIS — H26493 Other secondary cataract, bilateral: Secondary | ICD-10-CM | POA: Diagnosis not present

## 2022-01-15 DIAGNOSIS — H04123 Dry eye syndrome of bilateral lacrimal glands: Secondary | ICD-10-CM | POA: Diagnosis not present

## 2022-01-15 DIAGNOSIS — H35372 Puckering of macula, left eye: Secondary | ICD-10-CM | POA: Diagnosis not present

## 2022-01-23 ENCOUNTER — Other Ambulatory Visit: Payer: Self-pay | Admitting: Family Medicine

## 2022-01-23 DIAGNOSIS — F418 Other specified anxiety disorders: Secondary | ICD-10-CM

## 2022-01-27 ENCOUNTER — Telehealth: Payer: Self-pay | Admitting: Family Medicine

## 2022-01-27 NOTE — Telephone Encounter (Signed)
Left message for patient to call back and schedule Medicare Annual Wellness Visit (AWV).   Please offer to do virtually or by telephone.  Left office number and my jabber #[phone].  Last AWV:06/30/2018  Please schedule at anytime with Nurse Health Advisor.   

## 2022-01-29 ENCOUNTER — Other Ambulatory Visit: Payer: Self-pay | Admitting: Family Medicine

## 2022-01-29 DIAGNOSIS — K581 Irritable bowel syndrome with constipation: Secondary | ICD-10-CM

## 2022-02-09 ENCOUNTER — Other Ambulatory Visit: Payer: Self-pay | Admitting: Cardiovascular Disease

## 2022-02-11 ENCOUNTER — Ambulatory Visit (INDEPENDENT_AMBULATORY_CARE_PROVIDER_SITE_OTHER): Payer: Medicare HMO | Admitting: Family Medicine

## 2022-02-11 VITALS — BP 120/70 | HR 64 | Temp 97.1°F | Ht 66.0 in | Wt 141.0 lb

## 2022-02-11 DIAGNOSIS — F418 Other specified anxiety disorders: Secondary | ICD-10-CM

## 2022-02-11 DIAGNOSIS — E782 Mixed hyperlipidemia: Secondary | ICD-10-CM

## 2022-02-11 DIAGNOSIS — I10 Essential (primary) hypertension: Secondary | ICD-10-CM

## 2022-02-11 DIAGNOSIS — I272 Pulmonary hypertension, unspecified: Secondary | ICD-10-CM | POA: Diagnosis not present

## 2022-02-11 DIAGNOSIS — F99 Mental disorder, not otherwise specified: Secondary | ICD-10-CM

## 2022-02-11 DIAGNOSIS — F5105 Insomnia due to other mental disorder: Secondary | ICD-10-CM

## 2022-02-11 MED ORDER — ATORVASTATIN CALCIUM 20 MG PO TABS: 10.0000 mg | ORAL_TABLET | Freq: Every day | ORAL | 3 refills | Status: DC

## 2022-02-11 MED ORDER — TRAZODONE HCL 50 MG PO TABS: 25.0000 mg | ORAL_TABLET | Freq: Every evening | ORAL | 3 refills | Status: DC | PRN

## 2022-02-19 ENCOUNTER — Other Ambulatory Visit: Payer: Self-pay | Admitting: Family Medicine

## 2022-02-19 ENCOUNTER — Ambulatory Visit: Payer: Medicare HMO | Admitting: Cardiovascular Disease

## 2022-02-19 ENCOUNTER — Encounter: Payer: Self-pay | Admitting: Cardiovascular Disease

## 2022-02-19 VITALS — BP 130/80 | HR 69 | Ht 66.0 in | Wt 141.8 lb

## 2022-02-19 DIAGNOSIS — F341 Dysthymic disorder: Secondary | ICD-10-CM

## 2022-02-19 DIAGNOSIS — E782 Mixed hyperlipidemia: Secondary | ICD-10-CM | POA: Diagnosis not present

## 2022-02-19 DIAGNOSIS — I10 Essential (primary) hypertension: Secondary | ICD-10-CM | POA: Diagnosis not present

## 2022-02-19 DIAGNOSIS — I071 Rheumatic tricuspid insufficiency: Secondary | ICD-10-CM

## 2022-02-19 MED ORDER — EZETIMIBE 10 MG PO TABS: 10.0000 mg | ORAL_TABLET | Freq: Every day | ORAL | 3 refills | Status: DC

## 2022-02-19 MED ORDER — METOPROLOL SUCCINATE ER 25 MG PO TB24: 25.0000 mg | ORAL_TABLET | Freq: Two times a day (BID) | ORAL | 3 refills | Status: DC

## 2022-02-19 NOTE — Progress Notes (Signed)
Cardiology Office Note:    Date:  02/19/2022   ID:  Megan [name], DOB 08/16/1951, MRN [id number]  PCP:  Haydee Salter, MD   [facility] Prov[facility] Cardiologist:  Sherren Mocha, MD     Referring MD: Haydee Salter, MD   Chief Complaint  Patient presents with   Follow-up    Tricuspid regurgitation    History of Present Illness:    Megan [name] is a 71 y.o. female with a hx of: Chest pain  Palpitations  Monitor 5/21: no significant arrhythmias  Mild to mod TR Hypertension  Hyperlipidemia  Intol of statins  GERD  Hypothyroidism   The patient is here alone today.  She has been doing well with no recent symptoms of chest pain or shortness of breath.  She does have some fatigue.  No orthopnea, PND, recent heart palpitations, or edema.  She has recently undergone several cardiac studies which we reviewed today.  She had a coronary CT angiogram demonstrating moderate nonobstructive CAD with negative CT-FFR.  She had an echocardiogram showing stable mild to moderate tricuspid regurgitation.  Right heart catheterization was done to assess for pulmonary hypertension and her intracardiac pressures were completely normal with preserved cardiac output.  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 difficile diarrhea    Cancer ([place])    pt denies    Chest pain, atypical 07/14/2011   Cold sore 07/17/2016   Colon polyp    Contact dermatitis 03/23/2012   Cystocele, midline    Depression    Diarrhea    Diverticulosis    Endometrial hyperplasia 02/27/2006   BENIGN ENDO BX ON 02/2007   GERD (gastroesophageal reflux disease)    Headache 04/16/2015   Headache    Headache(784.0) 04/15/2012   pt denies    Heart murmur    Hematuria 04/27/2012   Hepatic cyst    Hiatal  hernia    Hiatal hernia    Hyperlipidemia, mixed 12/17/2014   Hypertension    IBS (irritable bowel syndrome)    Insomnia    Leaky heart valve    Low back pain 08/15/2013   Osteoporosis 03/2017   T score -2.5. 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   Sinusitis acute 02/03/2012   Thrush 07/21/2011   Tricuspid regurgitation, Mild-Moderate 07/21/2011   Moderate by 2 d echocardiogram Echo 11/2021: EF 60-65, no RWMA, normal RVSF, moderately elevated PASP, RVSP 52.1, trivial MR, mild to moderate TR   Unspecified hypothyroidism    Unspecified menopausal and postmenopausal disorder    Uterine prolapse without mention of vaginal wall prolapse    Vitamin B12 deficiency    Vitamin D deficiency 07/17/2016    Past Surgical History:  Procedure Laterality Date   APPENDECTOMY     endoscopic hemoclip     eye surgery Left    lens implant   HYSTEROSCOPY     POLYP   OPEN REDUCTION INTERNAL FIXATION (ORIF) DISTAL RADIAL FRACTURE Left 01/17/2019   Procedure: LEFT OPEN REDUCTION INTERNAL FIXATION (ORIF) DISTAL RADIUS FRACTURE;  Surgeon: Fredna Dow,  Dominica Severin, MD;  Location: [facility];  Service: Orthopedics;  Laterality: Left;  AXILLARY BLOCK   PERIPHERAL VASCULAR CATHETERIZATION Left 07/24/2016   Procedure: Renal Angiography;  Surgeon: Conrad [place], MD;  Location: [facility];  Service: Cardiovascular;  Laterality: Left;   RIGHT HEART CATH N/A 01/01/2022   Procedure: RIGHT HEART CATH;  Surgeon: Sherren Mocha, MD;  Location: [facility];  Service: Cardiovascular;  Laterality: N/A;   ROBOTIC ASSISTED BILATERAL SALPINGO OOPHERECTOMY  11/15/2020   ROBOTIC ASSISTED LAPAROSCOPIC SACROCOLPOPEXY Bilateral 11/15/2020   Procedure: XI ROBOTIC ASSISTED LAPAROSCOPIC SACROCOLPOPEXY AND SUPRACERVICIAL  HYSTERECTOMY BILATERAL SALPINGO-OOPHERECTOMY;  Surgeon: Ardis Hughs, MD;  Location: [facility];  Service: Urology;  Laterality: Bilateral;  REQUESTING 4.5 HRS   ROBOTIC ASSISTED SUPRACERVICAL HYSTERECTOMY  11/15/2020   UPPER GI ENDOSCOPY     UTERINE FIBROID SURGERY      Current Medications: Current Meds  Medication Sig   albuterol (VENTOLIN HFA) 108 (90 Base) MCG/ACT inhaler TAKE 2 PUFFS BY MOUTH EVERY 6 HOURS AS NEEDED FOR WHEEZE OR SHORTNESS OF BREATH   ALPRAZolam (XANAX) 1 MG tablet TAKE 1 TABLET BY MOUTH TWICE A DAY AS NEEDED   Alum & Mag Hydroxide-Simeth (MYLANTA PO) Take by mouth. PRN   Alum Hydroxide-Mag Carbonate (GAVISCON PO) Take by mouth. PRN   aspirin EC 81 MG tablet Take 81 mg by mouth daily. Swallow whole.   atorvastatin (LIPITOR) 20 MG tablet Take 0.5 tablets (10 mg total) by mouth daily.   Biotin 10 MG TABS Take by mouth.   cholecalciferol (VITAMIN D3) 25 MCG (1000 UNIT) tablet Take 5,000 Units by mouth daily.   dicyclomine (BENTYL) 10 MG capsule    diltiazem (CARDIZEM CD) 180 MG 24 hr capsule TAKE 1 CAPSULE BY MOUTH EVERY DAY   estradiol (ESTRACE) 0.1 MG/GM vaginal cream Place 0.5 g vaginally 2 (two) times a week. Place 0.5g nightly for two weeks then twice a week after   ezetimibe (ZETIA) 10 MG tablet Take 1 tablet (10 mg total) by mouth daily.   famotidine-calcium carbonate-magnesium hydroxide (PEPCID COMPLETE) 10-800-165 MG chewable tablet Chew 1 tablet by mouth daily as needed.   fluticasone (FLONASE) 50 MCG/ACT nasal spray Place 2 sprays into both nostrils daily.   gabapentin (NEURONTIN) 300 MG capsule TAKE 1 CAPSULE BY MOUTH THREE TIMES A DAY   linaclotide (LINZESS) 72 MCG capsule Take 1 capsule (72 mcg total) by mouth daily before breakfast.   Multiple Vitamin (MULTIVITAMIN) tablet Take 1 tablet by mouth daily.   omeprazole (PRILOSEC) 40 MG capsule Take 40 mg by mouth in the morning and at bedtime.   ondansetron (ZOFRAN) 4 MG tablet TAKE 1 TABLET BY MOUTH EVERY  8 HOURS AS NEEDED FOR NAUSEA AND VOMITING   polyethylene glycol (MIRALAX / GLYCOLAX) 17 g packet Take 17 g by mouth daily.   promethazine (PHENERGAN) 25 MG tablet Take 25 mg by mouth every 6 (six) hours as needed for nausea or vomiting.   tiZANidine (ZANAFLEX) 4 MG tablet Take 1 tablet (4 mg total) by mouth every 8 (eight) hours as needed for muscle spasms.   traZODone (DESYREL) 50 MG tablet Take 0.5-1 tablets (25-50 mg total) by mouth at bedtime as needed for sleep.   valsartan (DIOVAN) 80 MG tablet Take 0.5 tablets (40 mg total) by mouth daily.   [DISCONTINUED] metoprolol succinate (TOPROL-XL) 25 MG 24 hr tablet Take 25 mg by mouth in the morning and at bedtime.     Allergies:   Sulfamethoxazole,  Amitriptyline, Cymbalta [duloxetine hcl], Lactose intolerance (gi), Lexapro [escitalopram oxalate], Topamax [topiramate], and Prednisone   Social History   Socioeconomic History   Marital status: Married    Spouse name: Not on file   Number of children: 4   Years of education: 12   Highest education level: High school graduate  Occupational History   Occupation: Retired  Tobacco Use   Smoking status: Former    Types: Cigarettes    Quit date: 08/19/1995    Years since quitting: 26.5   Smokeless tobacco: Never  Vaping Use   Vaping Use: Never used  Substance and Sexual Activity   Alcohol use: Never    Alcohol/week: 0.0 standard drinks of alcohol   Drug use: Never   Sexual activity: Not Currently    Birth control/protection: Post-menopausal    Comment: 1st intercourse 72 yo-5 partners  Other Topics Concern   Not on file  Social History Narrative   Lives at home with her husband.   Right-handed.   2 cups caffeine per day.   Social Determinants of Health   Financial Resource Strain: Not on file  Food Insecurity: Not on file  Transportation Needs: Not on file  Physical Activity: Not on file  Stress: Not on file  Social Connections: Not on file     Family History: The patient's  family history includes Alcohol abuse in her daughter; Allergies in her sister, son, and son; Aneurysm in her paternal grandfather; Arthritis in her brother, son, son, and son; Breast cancer (age of onset: 42) in her paternal grandmother; Breast cancer (age of onset: 63) in her maternal grandmother; Colon cancer in her cousin, maternal aunt, maternal grandmother, and mother; Colon polyps in her father; Diabetes in her mother and paternal grandmother; Heart disease in her paternal grandfather; Hypertension in her mother, paternal grandfather, and son; Irritable bowel syndrome in her son; Osteoporosis in her son and son; Ovarian cancer in her cousin; Parkinson's disease in her father.  ROS:   Please see the history of present illness.    All other systems reviewed and are negative.  EKGs/Labs/Other Studies Reviewed:    The following studies were reviewed today: CT coronary 12/24/21: FINDINGS: Image quality: Good   Noise artifact is: Mild misregistration due to cardiac motion   Coronary calcium score is 115, which places the patient in the 75 percentile for age and sex matched control.   Coronary arteries: Normal coronary origins.  Right dominance.   Right Coronary Artery: Minimal scattered atherosclerotic plaques in mid and distal RCA, <25% stenosis. Patent RPLA and RPDA.   Left Main Coronary Artery: No detectable plaque or stenosis.   Left Anterior Descending Coronary Artery: Mild mixed atherosclerotic plaque in the proximal and mid LAD, 25-49% stenosis. Moderate mixed atherosclerotic plaque in the mid-distal LAD 50-69% stenosis just beyond the bifurcation of the third diagonal artery. D3 has a mild atherosclerotic plaque at the level of the bifurcation.   Left Circumflex Artery: Moderate mixed atherosclerotic plaque in the proximal LCx, 50-69% stenosis. May be subject to blooming artifact from calcium however calcium honing sequence (sharp) used for interpretation.   Aorta: Normal  size, 33 mm at the mid ascending aorta (level of the PA bifurcation) measured double oblique. Mild calcifications. No dissection.   Aortic Valve: No calcifications.   Other findings:   Normal pulmonary vein drainage into the left atrium.   Normal left atrial appendage without thrombus.   Normal size of the pulmonary artery.   IMPRESSION: 1. Moderate CAD in  the mid-distal LAD and proximal LCx, CADRADS = 3. CT FFR will be performed and reported separately.   2. Coronary calcium score is 115, which places the patient in the 72 percentile for age and sex matched control.   3. Normal coronary origins with right dominance.  CT-FFR: 1. Left Main: FFR = 0.98   2. LAD: Proximal FFR = 0.96, mid FFR = 0.86, distal FFR = not mapped, D3 FFR = 0.90 3. LCX: Proximal FFR = 0.94, distal FFR = 0.92 4. RCA: Proximal FFR = 0.98, mid FFR =0.97, Distal FFR = 0.97   IMPRESSION: 1.  CT FFR analysis showed no significant stenosis.   RECOMMENDATIONS: Guideline-directed medical therapy and aggressive risk factor modification for secondary prevention of coronary artery disease.  RHC: Normal right heart pressures including normal PA pressures with no evidence of pulmonary hypertension   RA mean 0 RV 31/1 PA 20/2 mean 11 Pulmonary wedge pressure mean 5   Pulmonary artery oxygen saturation 76% SVC oxygen saturation 76% Aortic oxygen saturation 99%   Cardiac output 5.5 L/min Cardiac index 3.2 L/min/m  Echo 11/24/21: 1. Left ventricular ejection fraction, by estimation, is 60 to 65%. Left  ventricular ejection fraction by 3D volume is 63 %. The left ventricle has  normal function. The left ventricle has no regional wall motion  abnormalities. Left ventricular diastolic   parameters were normal.   2. Right ventricular systolic function is normal. The right ventricular  size is normal. There is moderately elevated pulmonary artery systolic  pressure. The estimated right ventricular systolic  pressure is 97.9 mmHg.   3. The mitral valve is grossly normal. Trivial mitral valve  regurgitation.   4. Tricuspid valve regurgitation is mild to moderate.   5. The aortic valve is tricuspid. Aortic valve regurgitation is not  visualized.   6. The inferior vena cava is normal in size with <50% respiratory  variability, suggesting right atrial pressure of 8 mmHg.   Comparison(s): Changes from prior study are noted. 12/05/2019: LVEF 60-65%,  mild to moderate TR, RVSP 34.6 mmHg.   EKG:  EKG is not ordered today.    Recent Labs: 05/21/2021: Magnesium 2.3 12/18/2021: TSH 1.690 12/30/2021: BUN 7; Creatinine, Ser 0.65; Platelets 340 01/01/2022: Hemoglobin 12.9; Potassium 3.9; Sodium 141  Recent Lipid Panel    Component Value Date/Time   CHOL 145 07/29/2021 0803   CHOL 142 07/21/2019 1003   TRIG 87.0 07/29/2021 0803   HDL 55.60 07/29/2021 0803   HDL 50 07/21/2019 1003   CHOLHDL 3 07/29/2021 0803   VLDL 17.4 07/29/2021 0803   LDLCALC 72 07/29/2021 0803   LDLCALC 70 07/21/2019 1003   LDLDIRECT 165.0 06/30/2011 1153     Risk Assessment/Calculations:           Physical Exam:    VS:  BP 130/80   Pulse 69   Ht '5\' 6"'$  (1.676 m)   Wt 141 lb 12.8 oz (64.3 kg)   SpO2 99%   BMI 22.89 kg/m     Wt Readings from Last 3 Encounters:  02/19/22 141 lb 12.8 oz (64.3 kg)  02/11/22 141 lb (64 kg)  01/01/22 139 lb (63 kg)     GEN:  Well nourished, well developed in no acute distress HEENT: Normal NECK: No JVD; No carotid bruits LYMPHATICS: No lymphadenopathy CARDIAC: RRR, no murmurs, rubs, gallops RESPIRATORY:  Clear to auscultation without rales, wheezing or rhonchi  ABDOMEN: Soft, non-tender, non-distended MUSCULOSKELETAL:  No edema; No deformity  SKIN: Warm and dry  NEUROLOGIC:  Alert and oriented x 3 PSYCHIATRIC:  Normal affect   ASSESSMENT:    1. Tricuspid valve insufficiency, unspecified etiology   2. Mixed hyperlipidemia   3. Essential hypertension    PLAN:    In order  of problems listed above:  Stable findings of mild to moderate tricuspid regurgitation with no other significant valvular disease.  Recommend repeat echocardiogram in 1 year.  This is essentially been stable now for several.  Continue to follow. She is having trouble with statin intolerance.  She is cutting her atorvastatin in half and seems to be tolerating the 10 mg dose reasonably well.  She is not tolerant to even low doses of rosuvastatin.  I have asked her to add Zetia 10 mg daily and we will repeat lipids and LFTs in 3 to 4 months.  Her goal LDL cholesterol is less than '70mg'$ /dL if she has been found to have moderate nonobstructive CAD on CT angiography Blood pressure is well controlled on a combination of metoprolol, valsartan, and diltiazem. Treated with aspirin, statin drug, and a beta-blocker.  No angina.     Medication Adjustments/Labs and Tests Ordered: Current medicines are reviewed at length with the patient today.  Concerns regarding medicines are outlined above.  Orders Placed This Encounter  Procedures   Lipid panel   Hepatic function panel   ECHOCARDIOGRAM COMPLETE   Meds ordered this encounter  Medications   metoprolol succinate (TOPROL-XL) 25 MG 24 hr tablet    Sig: Take 1 tablet (25 mg total) by mouth in the morning and at bedtime.    Dispense:  180 tablet    Refill:  3   ezetimibe (ZETIA) 10 MG tablet    Sig: Take 1 tablet (10 mg total) by mouth daily.    Dispense:  90 tablet    Refill:  3    Patient Instructions  Medication Instructions:  START Zetia '10mg'$  daily *If you need a refill on your cardiac medications before your next appointment, please call your pharmacy*   Lab Work: LIPIDS, LIVER in 3 months If you have labs (blood work) drawn today and your tests are completely normal, you will receive your results only by: [facility] (if you have MyChart) OR A paper copy in the mail If you have any lab test that is abnormal or we need to change your  treatment, we will call you to review the results.   Testing/Procedures: ECHO (prior to next appt in 1 year) Your physician has requested that you have an echocardiogram. Echocardiography is a painless test that uses sound waves to create images of your heart. It provides your doctor with information about the size and shape of your heart and how well your heart's chambers and valves are working. This procedure takes approximately one hour. There are no restrictions for this procedure.  Follow-Up: At [facility], you and your health needs are our priority.  As part of our continuing mission to provide you with exceptional heart care, we have created designated Provider Care Teams.  These Care Teams include your primary Cardiologist (physician) and Advanced Practice Providers (APPs -  Physician Assistants and Nurse Practitioners) who all work together to provide you with the care you need, when you need it.  Your next appointment:   1 year(s)  The format for your next appointment:   In Person  Provider:   Sherren Mocha, MD    Important Information About Sugar         Signed, Legrand Como  Burt Knack, MD  02/19/2022 1:21 PM    [place]

## 2022-02-19 NOTE — Patient Instructions (Signed)
Medication Instructions:  START Zetia '10mg'$  daily *If you need a refill on your cardiac medications before your next appointment, please call your pharmacy*   Lab Work: LIPIDS, LIVER in 3 months If you have labs (blood work) drawn today and your tests are completely normal, you will receive your results only by: [facility] (if you have MyChart) OR A paper copy in the mail If you have any lab test that is abnormal or we need to change your treatment, we will call you to review the results.   Testing/Procedures: ECHO (prior to next appt in 1 year) Your physician has requested that you have an echocardiogram. Echocardiography is a painless test that uses sound waves to create images of your heart. It provides your doctor with information about the size and shape of your heart and how well your heart's chambers and valves are working. This procedure takes approximately one hour. There are no restrictions for this procedure.  Follow-Up: At [facility], you and your health needs are our priority.  As part of our continuing mission to provide you with exceptional heart care, we have created designated Provider Care Teams.  These Care Teams include your primary Cardiologist (physician) and Advanced Practice Providers (APPs -  Physician Assistants and Nurse Practitioners) who all work together to provide you with the care you need, when you need it.  Your next appointment:   1 year(s)  The format for your next appointment:   In Person  Provider:   Sherren Mocha, MD    Important Information About Sugar

## 2022-02-20 NOTE — Telephone Encounter (Signed)
Refill request for  Alprazolam 1 mg LR 12/25/21, #30, 1 rf LOV 02/11/22 FOV  none scheduled    Please review and advise.   Thanks. Dm/cma

## 2022-02-25 ENCOUNTER — Telehealth: Payer: Medicare HMO | Admitting: Family Medicine

## 2022-02-25 DIAGNOSIS — J029 Acute pharyngitis, unspecified: Secondary | ICD-10-CM | POA: Diagnosis not present

## 2022-02-25 DIAGNOSIS — J019 Acute sinusitis, unspecified: Secondary | ICD-10-CM | POA: Diagnosis not present

## 2022-02-25 DIAGNOSIS — B9689 Other specified bacterial agents as the cause of diseases classified elsewhere: Secondary | ICD-10-CM

## 2022-02-25 MED ORDER — LIDOCAINE VISCOUS HCL 2 % MT SOLN: 15.0000 mL | OROMUCOSAL | 0 refills | Status: DC | PRN

## 2022-02-25 MED ORDER — AMOXICILLIN-POT CLAVULANATE 875-125 MG PO TABS: 1.0000 | ORAL_TABLET | Freq: Two times a day (BID) | ORAL | 0 refills | Status: AC

## 2022-02-25 NOTE — Progress Notes (Signed)
E-Visit for Sinus Problems  We are sorry that you are not feeling well.  Here is how we plan to help!  Based on what you have shared with me it looks like you have sinusitis.  Sinusitis is inflammation and infection in the sinus cavities of the head.  Based on your presentation I believe you most likely have Acute Bacterial Sinusitis.  This is an infection caused by bacteria and is treated with antibiotics. I have prescribed Augmentin '875mg'$ /'125mg'$  one tablet twice daily with food, for 7 days. You may use an oral decongestant such as Mucinex D or if you have glaucoma or high blood pressure use plain Mucinex. Saline nasal spray help and can safely be used as often as needed for congestion.  If you develop worsening sinus pain, fever or notice severe headache and vision changes, or if symptoms are not better after completion of antibiotic, please schedule an appointment with a health care provider.    I also ordered a swish to help with pain.  Sinus infections are not as easily transmitted as other respiratory infection, however we still recommend that you avoid close contact with loved ones, especially the very young and elderly.  Remember to wash your hands thoroughly throughout the day as this is the number one way to prevent the spread of infection!  Home Care: Only take medications as instructed by your medical team. Complete the entire course of an antibiotic. Do not take these medications with alcohol. A steam or ultrasonic humidifier can help congestion.  You can place a towel over your head and breathe in the steam from hot water coming from a faucet. Avoid close contacts especially the very young and the elderly. Cover your mouth when you cough or sneeze. Always remember to wash your hands.  Get Help Right Away If: You develop worsening fever or sinus pain. You develop a severe head ache or visual changes. Your symptoms persist after you have completed your treatment plan.  Make sure  you Understand these instructions. Will watch your condition. Will get help right away if you are not doing well or get worse.  Thank you for choosing an e-visit.  Your e-visit answers were reviewed by a board certified advanced clinical practitioner to complete your personal care plan. Depending upon the condition, your plan could have included both over the counter or prescription medications.  Please review your pharmacy choice. Make sure the pharmacy is open so you can pick up prescription now. If there is a problem, you may contact your provider through [employer] and have the prescription routed to another pharmacy.  Your safety is important to [place]. If you have drug allergies check your prescription carefully.   For the next 24 hours you can use MyChart to ask questions about today's visit, request a non-urgent call back, or ask for a work or school excuse. You will get an email in the next two days asking about your experience. I hope that your e-visit has been valuable and will speed your recovery.  I provided 5 minutes of non face-to-face time during this encounter for chart review, medication and order placement, as well as and documentation.

## 2022-03-01 ENCOUNTER — Other Ambulatory Visit: Payer: Self-pay | Admitting: Family Medicine

## 2022-03-01 DIAGNOSIS — I071 Rheumatic tricuspid insufficiency: Secondary | ICD-10-CM

## 2022-03-01 DIAGNOSIS — E782 Mixed hyperlipidemia: Secondary | ICD-10-CM

## 2022-03-01 DIAGNOSIS — I10 Essential (primary) hypertension: Secondary | ICD-10-CM

## 2022-03-06 ENCOUNTER — Other Ambulatory Visit: Payer: Self-pay | Admitting: Family Medicine

## 2022-03-06 ENCOUNTER — Telehealth: Payer: Medicare HMO | Admitting: Physician Assistant

## 2022-03-06 DIAGNOSIS — R3989 Other symptoms and signs involving the genitourinary system: Secondary | ICD-10-CM

## 2022-03-06 DIAGNOSIS — F5105 Insomnia due to other mental disorder: Secondary | ICD-10-CM

## 2022-03-06 MED ORDER — NITROFURANTOIN MONOHYD MACRO 100 MG PO CAPS: 100.0000 mg | ORAL_CAPSULE | Freq: Two times a day (BID) | ORAL | 0 refills | Status: DC

## 2022-03-06 NOTE — Progress Notes (Signed)
E-Visit for Urinary Problems  We are sorry that you are not feeling well.  Here is how we plan to help!  Based on what you shared with me it looks like you most likely have a simple urinary tract infection.  A UTI (Urinary Tract Infection) is a bacterial infection of the bladder.  Most cases of urinary tract infections are simple to treat but a key part of your care is to encourage you to drink plenty of fluids and watch your symptoms carefully.  I have prescribed MacroBid 100 mg twice a day for 5 days.  Your symptoms should gradually improve. Call us if the burning in your urine worsens, you develop worsening fever, back pain or pelvic pain or if your symptoms do not resolve after completing the antibiotic.  Urinary tract infections can be prevented by drinking plenty of water to keep your body hydrated.  Also be sure when you wipe, wipe from front to back and don't hold it in!  If possible, empty your bladder every 4 hours.  HOME CARE Drink plenty of fluids Compete the full course of the antibiotics even if the symptoms resolve Remember, when you need to go.go. Holding in your urine can increase the likelihood of getting a UTI! GET HELP RIGHT AWAY IF: You cannot urinate You get a high fever Worsening back pain occurs You see blood in your urine You feel sick to your stomach or throw up You feel like you are going to pass out  MAKE SURE YOU  Understand these instructions. Will watch your condition. Will get help right away if you are not doing well or get worse.   Thank you for choosing an e-visit.  Your e-visit answers were reviewed by a board certified advanced clinical practitioner to complete your personal care plan. Depending upon the condition, your plan could have included both over the counter or prescription medications.  Please review your pharmacy choice. Make sure the pharmacy is open so you can pick up prescription now. If there is a problem, you may contact your  provider through [employer] and have the prescription routed to another pharmacy.  Your safety is important to [place]. If you have drug allergies check your prescription carefully.   For the next 24 hours you can use MyChart to ask questions about today's visit, request a non-urgent call back, or ask for a work or school excuse. You will get an email in the next two days asking about your experience. I hope that your e-visit has been valuable and will speed your recovery.  I provided 5 minutes of non face-to-face time during this encounter for chart review and documentation.

## 2022-03-18 ENCOUNTER — Other Ambulatory Visit: Payer: Self-pay | Admitting: Family Medicine

## 2022-03-18 DIAGNOSIS — K581 Irritable bowel syndrome with constipation: Secondary | ICD-10-CM

## 2022-03-28 ENCOUNTER — Other Ambulatory Visit: Payer: Medicare HMO

## 2022-03-28 DIAGNOSIS — E782 Mixed hyperlipidemia: Secondary | ICD-10-CM

## 2022-03-28 LAB — ALT: ALT: 11 IU/L (ref 0–32)

## 2022-03-28 LAB — LIPID PANEL
Chol/HDL Ratio: 3 ratio (ref 0.0–4.4)
Cholesterol, Total: 142 mg/dL (ref 100–199)
HDL: 48 mg/dL (ref >39)
LDL Chol Calc (NIH): 77 mg/dL (ref 0–99)
Triglycerides: 93 mg/dL (ref 0–149)
VLDL Cholesterol Cal: 17 mg/dL (ref 5–40)

## 2022-03-31 ENCOUNTER — Telehealth: Payer: Self-pay | Admitting: *Deleted

## 2022-03-31 MED ORDER — ATORVASTATIN CALCIUM 80 MG PO TABS: 80.0000 mg | ORAL_TABLET | Freq: Every day | ORAL | 3 refills | Status: DC

## 2022-03-31 NOTE — Telephone Encounter (Signed)
-----   Message from Liliane Shi, [place] sent at 03/30/2022  8:18 PM EDT ----- LDL still slightly above goal. ALT normal.  PLAN:  -Increase Atorvastatin to 80 mg once daily.  -Lipids, LFTs in 3 mos.  Remington Dopp, PA-C    03/30/2022 8:16 PM

## 2022-04-01 ENCOUNTER — Ambulatory Visit: Payer: Medicare HMO | Admitting: Obstetrics and Gynecology

## 2022-04-09 ENCOUNTER — Other Ambulatory Visit: Payer: Self-pay | Admitting: Family Medicine

## 2022-04-09 DIAGNOSIS — F5105 Insomnia due to other mental disorder: Secondary | ICD-10-CM

## 2022-04-16 ENCOUNTER — Encounter: Payer: Self-pay | Admitting: Obstetrics and Gynecology

## 2022-04-16 ENCOUNTER — Ambulatory Visit (INDEPENDENT_AMBULATORY_CARE_PROVIDER_SITE_OTHER): Payer: Medicare HMO | Admitting: Obstetrics and Gynecology

## 2022-04-16 VITALS — BP 161/74 | HR 97

## 2022-04-16 DIAGNOSIS — M62838 Other muscle spasm: Secondary | ICD-10-CM | POA: Diagnosis not present

## 2022-04-16 DIAGNOSIS — N952 Postmenopausal atrophic vaginitis: Secondary | ICD-10-CM

## 2022-04-16 DIAGNOSIS — N3941 Urge incontinence: Secondary | ICD-10-CM

## 2022-04-16 MED ORDER — CYCLOBENZAPRINE HCL 5 MG PO TABS: 5.0000 mg | ORAL_TABLET | Freq: Three times a day (TID) | ORAL | 1 refills | Status: DC | PRN

## 2022-04-16 NOTE — Patient Instructions (Addendum)
Start vaginal estrogen therapy nightly for two weeks then 2 times weekly at night for treatment of vaginal atrophy (dryness of the vaginal tissues).  Please let us know if the prescription is too expensive and we can look for alternative options.   This was previously prescribed at [facility] in Feb and there should be refills available.   Take Flexeril at night as needed for pelvic pain/ pressure.

## 2022-04-16 NOTE — Progress Notes (Unsigned)
[facility] Return Visit  SUBJECTIVE  History of Present Illness: Megan [name] is a 71 y.o. female seen in follow-up for urinary urgency.   She is having urinary frequency and feels rubbing in the vaginal area with walking. Has not been using vaginal estrogen. Voiding normally through the day then getting up 3-4 times per night. Sometimes when she stands up, then she has urine leak out. Does not happen all the time but will occur occasionally. Has never taken medication for her leakage. She drinks 1 cup coffee, then water, and a glass of tea with lemon at lunch and dinner. She is drinking water up until bedtime.   She was treated for a UTI with an e-visit and was given nitrofurantoin. She just had a strong odor for her urine but with no burning with urination. She did feel better after treatment.   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 difficile diarrhea, Cancer ([place]), Chest pain, atypical (07/14/2011), Cold sore (07/17/2016), Colon polyp, Contact dermatitis (03/23/2012), Cystocele, midline, Depression, Diarrhea, Diverticulosis, Endometrial hyperplasia (02/27/2006), GERD (gastroesophageal reflux disease), Headache (04/16/2015), Headache, Headache(784.0) (04/15/2012), Heart murmur, Hematuria (04/27/2012), Hepatic cyst, Hiatal hernia, Hiatal hernia, Hyperlipidemia, mixed (12/17/2014), Hypertension, IBS (irritable bowel syndrome), Insomnia, Leaky heart valve, Low back pain (08/15/2013), Osteoporosis (03/2017), Pancreatic cyst (04/19/2013), Paronychia of great toe, left (06/19/2013), Preventative health care (09/11/2014), Rectal bleeding (10/10/2011), Rectocele, 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   Past Surgical History: She  has a past surgical history that includes Appendectomy; Uterine fibroid surgery; Hysteroscopy; Cardiac catheterization (Left, 07/24/2016); Upper gi endoscopy; endoscopic hemoclip; eye surgery (Left); Open reduction internal fixation (orif) distal radial fracture (Left, 01/17/2019); Robotic assisted laparoscopic sacrocolpopexy (Bilateral, 11/15/2020); Robotic assisted bilateral salpingo oophorectomy (11/15/2020); Robotic assisted supracervical hysterectomy (11/15/2020); and RIGHT HEART CATH (N/A, 01/01/2022).   Medications: She has a current medication list which includes the following prescription(s): albuterol, alprazolam, calcium & magnesium carbonates, alum hydroxide-mag carbonate, aspirin ec, atorvastatin, biotin, cholecalciferol, cyclobenzaprine, dicyclomine, diltiazem, estradiol, famotidine-calcium carbonate-magnesium hydroxide, fluticasone, gabapentin, metoprolol succinate, multivitamin, omeprazole, ondansetron, polyethylene glycol, promethazine, tizanidine, trazodone, and valsartan.   Allergies: Patient is allergic to sulfamethoxazole, amitriptyline, cymbalta [duloxetine hcl], lactose intolerance (gi), lexapro [escitalopram oxalate], topamax [topiramate], and prednisone.   Social History: Patient  reports that she quit smoking about 26 years ago. Her smoking use included cigarettes. She has never used smokeless tobacco. She reports that she does not drink alcohol and does not use drugs.      OBJECTIVE     Physical Exam: Vitals:   04/16/22 1331  BP: (!) 161/74  Pulse: 97   Gen: No apparent distress, A&O x 3.  Detailed Urogynecologic Evaluation:  Deferred. Prior exam showed:      No  data to display             ASSESSMENT AND PLAN    Megan [name] is a 71 y.o. with:  1. Levator spasm   2. Vaginal atrophy

## 2022-04-17 ENCOUNTER — Encounter: Payer: Self-pay | Admitting: Obstetrics and Gynecology

## 2022-04-19 ENCOUNTER — Other Ambulatory Visit: Payer: Self-pay | Admitting: Obstetrics and Gynecology

## 2022-04-19 DIAGNOSIS — M810 Age-related osteoporosis without current pathological fracture: Secondary | ICD-10-CM

## 2022-04-22 ENCOUNTER — Other Ambulatory Visit: Payer: Self-pay | Admitting: Family Medicine

## 2022-04-22 DIAGNOSIS — F341 Dysthymic disorder: Secondary | ICD-10-CM

## 2022-04-23 NOTE — Telephone Encounter (Signed)
Last AEX 05/21/2021--nothing scheduled. Last DEXA 04/24/2020-Osteopenia (T-Score -2.0)  Per request: Rx was discontinued by a CMA on 07/26/2021 per pt preference. However, also per request, pharmacy states pt's last refill of this medication was on 01/29/2022. Will confirm with pt that she is still taking or not and also remind her to schedule annual appt.

## 2022-04-29 ENCOUNTER — Encounter: Payer: Self-pay | Admitting: Family Medicine

## 2022-04-29 ENCOUNTER — Ambulatory Visit (INDEPENDENT_AMBULATORY_CARE_PROVIDER_SITE_OTHER): Payer: Medicare HMO | Admitting: Family Medicine

## 2022-04-29 ENCOUNTER — Telehealth: Payer: Self-pay | Admitting: Family Medicine

## 2022-04-29 VITALS — BP 150/78 | HR 66 | Temp 97.4°F | Ht 66.0 in | Wt 141.8 lb

## 2022-04-29 DIAGNOSIS — I10 Essential (primary) hypertension: Secondary | ICD-10-CM

## 2022-04-29 DIAGNOSIS — R631 Polydipsia: Secondary | ICD-10-CM | POA: Diagnosis not present

## 2022-04-29 DIAGNOSIS — W57XXXA Bitten or stung by nonvenomous insect and other nonvenomous arthropods, initial encounter: Secondary | ICD-10-CM

## 2022-04-29 DIAGNOSIS — K581 Irritable bowel syndrome with constipation: Secondary | ICD-10-CM | POA: Diagnosis not present

## 2022-04-29 DIAGNOSIS — E782 Mixed hyperlipidemia: Secondary | ICD-10-CM

## 2022-04-29 DIAGNOSIS — S90561A Insect bite (nonvenomous), right ankle, initial encounter: Secondary | ICD-10-CM | POA: Diagnosis not present

## 2022-04-29 DIAGNOSIS — K5909 Other constipation: Secondary | ICD-10-CM | POA: Diagnosis not present

## 2022-04-29 DIAGNOSIS — Z789 Other specified health status: Secondary | ICD-10-CM

## 2022-04-29 DIAGNOSIS — R200 Anesthesia of skin: Secondary | ICD-10-CM

## 2022-04-29 DIAGNOSIS — Z23 Encounter for immunization: Secondary | ICD-10-CM

## 2022-04-29 DIAGNOSIS — R5383 Other fatigue: Secondary | ICD-10-CM | POA: Diagnosis not present

## 2022-04-29 LAB — TSH: TSH: 0.98 u[IU]/mL (ref 0.35–5.50)

## 2022-04-29 LAB — COMPREHENSIVE METABOLIC PANEL
ALT: 15 U/L (ref 0–35)
AST: 17 U/L (ref 0–37)
Albumin: 4.4 g/dL (ref 3.5–5.2)
Alkaline Phosphatase: 100 U/L (ref 39–117)
BUN: 7 mg/dL (ref 6–23)
CO2: 27 mEq/L (ref 19–32)
Calcium: 9.5 mg/dL (ref 8.4–10.5)
Chloride: 102 mEq/L (ref 96–112)
Creatinine, Ser: 0.64 mg/dL (ref 0.40–1.20)
GFR: 88.68 mL/min (ref >60.00)
Glucose, Bld: 92 mg/dL (ref 70–99)
Potassium: 4.1 mEq/L (ref 3.5–5.1)
Sodium: 138 mEq/L (ref 135–145)
Total Bilirubin: 0.4 mg/dL (ref 0.2–1.2)
Total Protein: 7.8 g/dL (ref 6.0–8.3)

## 2022-04-29 LAB — HEMOGLOBIN A1C: Hgb A1c MFr Bld: 5.8 % (ref 4.6–6.5)

## 2022-04-29 LAB — CBC
HCT: 43.3 % (ref 36.0–46.0)
Hemoglobin: 14.6 g/dL (ref 12.0–15.0)
MCHC: 33.7 g/dL (ref 30.0–36.0)
MCV: 94 fl (ref 78.0–100.0)
Platelets: 269 10*3/uL (ref 150.0–400.0)
RBC: 4.61 Mil/uL (ref 3.87–5.11)
RDW: 12.5 % (ref 11.5–15.5)
WBC: 6 10*3/uL (ref 4.0–10.5)

## 2022-04-29 MED ORDER — HYDROCHLOROTHIAZIDE 25 MG PO TABS: 25.0000 mg | ORAL_TABLET | Freq: Every day | ORAL | 3 refills | Status: DC

## 2022-04-29 NOTE — Progress Notes (Signed)
[facility] [address] [place] [address] Dept: [phone] Dept Fax: [phone]  Chronic Care Office Visit  Subjective:    Patient ID: Megan [name], female    DOB: 01-Jun-1951, 71 y.o..   MRN: [id number]  Chief Complaint  Patient presents with   Follow-up    F/u meds.  C/o having tingling in both hands, HA, and just not feeling good x 2 months.       History of Present Illness:  Patient is in today for reassessment of chronic medical issues.  Megan [name] notes that she feels like she is falling apart. She describes multiple symptoms that she finds disconcerting. She overall feels her health is very poor at this point.  She notes any ongoing issues with chronic constipation. She notes that she continues to try and take a number of medicines to resolve this. Despite these efforts, she often goes 2-3 weeks between bowel movements. Her usual regimen involves using Miralax, magnesium citrate, Dulcolax, and linactolide (Linzess). She has tried using Metamucil and increasing dietary fiber, but she feels like this causes her more troubles. She has seen a gastroenterologist about stomach issues and a prior "tumor" in her stomach. She states Dr. Jerene Pitch had discussed a repeat colonoscopy, but never moved forward with this.  Megan [name] complains of ongoing issues with bilateral hand numbness. This involves intermittent tingling in both hands and involves all of the fingers. She has a prior history of neck pain and a history of mild lumbar degenerative disc issues.  Megan [name] has a history of hyperlipidemia. She had seen Dr. Burt Knack (cardiology) in July. She is being managed on atorvastatin 10 mg daily. Dr. Burt Knack recognizes that she has been intolerant of higher doses. However, apparently a covering cardiologist saw her recent elevated cholesterol levels and prescribed 80 mg of atorvastatin. Megan [name] notes that she  cannot tolerate this, so is not taking her meds.  Megan [name] notes complaints of increased hunger, increased thirst, blurry vision, breaking out in sweats at times and generalized fatigue.  Megan [name] notes she stepped into a nest of ants recently while out walking. She says they were small black ants. She received repeated bites around her right ankle.  Megan [name] has a history of hypertension. She notes her blood pressures have been elevated more recently. She states she had been on a higher dose of valsartan in the past, but was developign some orthostasis, so this was lowered to a 1/2 tab daily. She is currently on Cardizem CD 180 mg daily, Toprol XL 25 mg daily, and valsartan 80 mg 1/2 tab (40 mg) daily.  Past Medical History: Patient Active Problem List   Diagnosis Date Noted   Allergic rhinitis 12/31/2021   Pulmonary hypertension, unspecified ([place]) 12/22/2021   Mild cognitive impairment 12/18/2021   Genitourinary syndrome of menopause 10/25/2021   Arthralgia of left temporomandibular joint 04/15/2021   Lacunar infarction ([place]) 02/07/2021   Adrenal cortical nodule ([place]) 02/05/2021   Bladder rupture 12/10/2020   Urinary retention 11/21/2020   Cystocele with prolapse 11/15/2020   Asthma 11/05/2020   Uterine prolapse 11/05/2020   Episodic cluster headache, not intractable 09/14/2020   Chronic left-sided headache 08/09/2020   Cerebral vascular disease 08/09/2020   Depression with anxiety 08/09/2020   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   Closed fracture of right distal radius 01/12/2019   Moderate obstructive sleep apnea 05/21/2017   Abdominal aortic atherosclerosis ([place]) 07/16/2016   Renal  artery stenosis ([place]) 06/12/2016   Chronic low back pain 08/24/2015   Precordial pain 06/04/2015   Hyperlipidemia, mixed 12/17/2014   Hepatic cyst 05/22/2014   Insomnia due to other mental disorder 03/10/2014   Pancreatic cyst 04/19/2013   Osteoporosis  09/07/2012   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   Palpitations 08/29/2011   Tricuspid regurgitation, Mild-Moderate 07/21/2011   Mitral regurgitation, Trivial 07/21/2011   Gastritis 06/17/2011   Endometrial hyperplasia    Diverticulosis    IBS (irritable bowel syndrome) with constipation 04/24/2011   Hiatal hernia 03/04/2011   Basal cell carcinoma (BCC) of face 11/15/2009   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   Essential hypertension 01/04/2009   Past Surgical History:  Procedure Laterality Date   APPENDECTOMY     endoscopic hemoclip     eye surgery Left    lens implant   HYSTEROSCOPY     POLYP   OPEN REDUCTION INTERNAL FIXATION (ORIF) DISTAL RADIAL FRACTURE Left 01/17/2019   Procedure: LEFT OPEN REDUCTION INTERNAL FIXATION (ORIF) DISTAL RADIUS FRACTURE;  Surgeon: Daryll Brod, MD;  Location: [facility];  Service: Orthopedics;  Laterality: Left;  AXILLARY BLOCK   PERIPHERAL VASCULAR CATHETERIZATION Left 07/24/2016   Procedure: Renal Angiography;  Surgeon: Conrad Tamarac, MD;  Location: [facility];  Service: Cardiovascular;  Laterality: Left;   RIGHT HEART CATH N/A 01/01/2022   Procedure: RIGHT HEART CATH;  Surgeon: Sherren Mocha, MD;  Location: [facility];  Service: Cardiovascular;  Laterality: N/A;   ROBOTIC ASSISTED BILATERAL SALPINGO OOPHERECTOMY  11/15/2020   ROBOTIC ASSISTED LAPAROSCOPIC SACROCOLPOPEXY Bilateral 11/15/2020   Procedure: XI ROBOTIC ASSISTED LAPAROSCOPIC SACROCOLPOPEXY AND SUPRACERVICIAL HYSTERECTOMY BILATERAL SALPINGO-OOPHERECTOMY;  Surgeon: Ardis Hughs, MD;  Location: [facility];  Service: Urology;  Laterality: Bilateral;  REQUESTING 4.5 HRS   ROBOTIC ASSISTED SUPRACERVICAL HYSTERECTOMY  11/15/2020   UPPER GI ENDOSCOPY     UTERINE FIBROID SURGERY     Family History  Problem Relation Age of Onset   Colon cancer Mother    Diabetes Mother    Hypertension Mother    Colon polyps Father    Parkinson's disease Father    Colon  cancer Maternal Grandmother    Breast cancer Maternal Grandmother 59   Diabetes Paternal Grandmother    Breast cancer Paternal Grandmother 72   Allergies Sister    Arthritis Brother        b/l hip replace   Alcohol abuse Daughter    Arthritis Son    Hypertension Son    Allergies Son    Osteoporosis Son    Osteoporosis Son    Arthritis Son        back disease, DDD   Allergies Son    Arthritis Son    Irritable bowel syndrome Son        RSD   Hypertension Paternal Grandfather    Heart disease Paternal Grandfather    Aneurysm Paternal Grandfather    Colon cancer Maternal Aunt    Colon cancer Cousin        X3   Ovarian cancer Cousin    Outpatient Medications Prior to Visit  Medication Sig Dispense Refill   albuterol (VENTOLIN HFA) 108 (90 Base) MCG/ACT inhaler TAKE 2 PUFFS BY MOUTH EVERY 6 HOURS AS NEEDED FOR WHEEZE OR SHORTNESS OF BREATH 8.5 each 1   ALPRAZolam (XANAX) 1 MG tablet TAKE 1 TABLET BY MOUTH TWICE A DAY AS NEEDED 30 tablet 1   Alum & Mag Hydroxide-Simeth (MYLANTA PO) Take by mouth.  PRN     Alum Hydroxide-Mag Carbonate (GAVISCON PO) Take by mouth. PRN     aspirin EC 81 MG tablet Take 81 mg by mouth daily. Swallow whole.     Biotin 10 MG TABS Take by mouth.     cholecalciferol (VITAMIN D3) 25 MCG (1000 UNIT) tablet Take 5,000 Units by mouth daily.     cyclobenzaprine (FLEXERIL) 5 MG tablet Take 1 tablet (5 mg total) by mouth 3 (three) times daily as needed for muscle spasms. 60 tablet 1   dicyclomine (BENTYL) 10 MG capsule      diltiazem (CARDIZEM CD) 180 MG 24 hr capsule TAKE 1 CAPSULE BY MOUTH EVERY DAY 90 capsule 3   estradiol (ESTRACE) 0.1 MG/GM vaginal cream Place 0.5 g vaginally 2 (two) times a week. Place 0.5g nightly for two weeks then twice a week after 30 g 11   famotidine-calcium carbonate-magnesium hydroxide (PEPCID COMPLETE) 10-800-165 MG chewable tablet Chew 1 tablet by mouth daily as needed.     fluticasone (FLONASE) 50 MCG/ACT nasal spray Place 2 sprays  into both nostrils daily. 16 g 6   gabapentin (NEURONTIN) 300 MG capsule TAKE 1 CAPSULE BY MOUTH THREE TIMES A DAY 90 capsule 5   metoprolol succinate (TOPROL-XL) 25 MG 24 hr tablet Take 1 tablet (25 mg total) by mouth in the morning and at bedtime. 180 tablet 3   Multiple Vitamin (MULTIVITAMIN) tablet Take 1 tablet by mouth daily.     omeprazole (PRILOSEC) 40 MG capsule Take 40 mg by mouth in the morning and at bedtime.     ondansetron (ZOFRAN) 4 MG tablet TAKE 1 TABLET BY MOUTH EVERY 8 HOURS AS NEEDED FOR NAUSEA AND VOMITING 20 tablet 3   polyethylene glycol (MIRALAX / GLYCOLAX) 17 g packet Take 17 g by mouth daily. 14 each 0   promethazine (PHENERGAN) 25 MG tablet Take 25 mg by mouth every 6 (six) hours as needed for nausea or vomiting.     tiZANidine (ZANAFLEX) 4 MG tablet Take 1 tablet (4 mg total) by mouth every 8 (eight) hours as needed for muscle spasms. 30 tablet 6   traZODone (DESYREL) 50 MG tablet TAKE 0.5-1 TABLETS BY MOUTH AT BEDTIME AS NEEDED FOR SLEEP. 90 tablet 2   valsartan (DIOVAN) 80 MG tablet Take 0.5 tablets (40 mg total) by mouth daily. 90 tablet 1   atorvastatin (LIPITOR) 80 MG tablet Take 1 tablet (80 mg total) by mouth daily. (Patient not taking: Reported on 04/29/2022) 90 tablet 3   No facility-administered medications prior to visit.   Allergies  Allergen Reactions   Sulfamethoxazole Shortness Of Breath    chest tightness   Amitriptyline Anxiety and Other (See Comments)    Depression, insomnia   Cymbalta [Duloxetine Hcl] Other (See Comments)    Insomnia, constipation   Lactose Intolerance (Gi) Diarrhea and Nausea And Vomiting   Lexapro [Escitalopram Oxalate]     nausea   Topamax [Topiramate] Itching   Prednisone Palpitations   Objective:   Today's Vitals   04/29/22 1011 04/29/22 1022  BP: (!) 150/76 (!) 150/78  Pulse: 66   Temp: (!) 97.4 F (36.3 C)   TempSrc: Temporal   SpO2: 96%   Weight: 141 lb 12.8 oz (64.3 kg)   Height: '5\' 6"'$  (1.676 m)    Body  mass index is 22.89 kg/m.   General: Well developed, well nourished. No acute distress. Skin: Warm and dry. There is a ring of small pustules around the right ankle with an  erythematous base for   each one. Neuro: CN II-XII intact. Normal sensation. Tinel's and Phalen's tests are normal. Psych: Alert and oriented. Normal mood and affect.  Health Maintenance Due  Topic Date Due   Hepatitis C Screening  Never done   Zoster Vaccines- Shingrix (1 of 2) Never done   Pneumonia Vaccine 37+ Years old (1 - PCV) Never done   INFLUENZA VACCINE  03/18/2022     Lab Results: Lab Results  Component Value Date   CHOL 142 03/28/2022   HDL 48 03/28/2022   LDLCALC 77 03/28/2022   LDLDIRECT 165.0 06/30/2011   TRIG 93 03/28/2022   CHOLHDL 3.0 03/28/2022   Assessment & Plan:   1. Chronic constipation Megan [name]'s issue is not responding to first-line treatments for constipation. I wonder about some sort of stricture to her bowel in light of fiber seeming to exacerbate the issue. I will refer her back to her gastroenterologist to consider further workup, including a repeat colonoscopy.  - Ambulatory referral to Gastroenterology  2. Irritable bowel syndrome with constipation As above, this may play a role in her issue.  - Ambulatory referral to Gastroenterology  3. Hyperlipidemia, mixed In light of her statin intolerance, I recommend we refer Megan [name] to Dr. Debara Pickett to consider her for [place].  - AMB Referral to [facility]  4. Statin intolerance As above. - AMB Referral to [facility]  5. Essential hypertension Megan [name]'s blood pressure is high today. I will add HCTZ to her current regimen.  - Comprehensive metabolic panel - hydrochlorothiazide (HYDRODIURIL) 25 MG tablet; Take 1 tablet (25 mg total) by mouth daily.  Dispense: 90 tablet; Refill: 3  6. Increased thirst Due to her complaint of polyphagia, polydipsia, and blurred  vision, I will check an A1c and blood panel, primarily to rule out diabetes.  - Hemoglobin A1c - Comprehensive metabolic panel  7. Other fatigue I will check screening labs for causes of fatigue.  - CBC - TSH - Hemoglobin A1c - Comprehensive metabolic panel  8. Insect bite of right ankle, initial encounter The rash around the ankle is consistent with ant bites. I recommend Megan [name] keep the area clean and allow time for healing.  9. Bilateral hand numbness The etiology of this is unclear. I will have her get a plain x-rayu of the neck. If abnoraml, we might proceed with an MRI scan.  - DG Cervical Spine Complete; Future  10. Need for influenza vaccination  - Flu Vaccine QUAD High Dose(Fluad)  I spent 60 minutes reviewing prior consults, taking a history and conducting a focused physical exam, placing orders, and documenting care.  Return in about 4 weeks (around 05/27/2022) for Reassessment.   Haydee Salter, MD

## 2022-04-29 NOTE — Telephone Encounter (Signed)
Caller Name: Edyth Glomb Call back phone #: [phone]  Reason for Call: Pt picked up prescriptions and the fluid pill that Dr. Gena Fray spoke about during visit (9/12) was not sent in and was not included in her AVS.

## 2022-04-30 NOTE — Telephone Encounter (Signed)
Noted. Dm/cma  

## 2022-05-01 ENCOUNTER — Other Ambulatory Visit: Payer: Medicare HMO

## 2022-05-01 ENCOUNTER — Ambulatory Visit (INDEPENDENT_AMBULATORY_CARE_PROVIDER_SITE_OTHER): Payer: Medicare HMO

## 2022-05-01 DIAGNOSIS — M4312 Spondylolisthesis, cervical region: Secondary | ICD-10-CM | POA: Diagnosis not present

## 2022-05-01 DIAGNOSIS — M5030 Other cervical disc degeneration, unspecified cervical region: Secondary | ICD-10-CM

## 2022-05-01 DIAGNOSIS — R200 Anesthesia of skin: Secondary | ICD-10-CM

## 2022-05-01 NOTE — Progress Notes (Unsigned)
Initial visit for bilateral hand numbness and intermittent tingling in both hands .  Denies injury Hx of neck pain and mild lumbar DDD

## 2022-05-03 DIAGNOSIS — M5030 Other cervical disc degeneration, unspecified cervical region: Secondary | ICD-10-CM | POA: Insufficient documentation

## 2022-05-07 ENCOUNTER — Encounter: Payer: Self-pay | Admitting: Cardiovascular Disease

## 2022-05-07 DIAGNOSIS — E782 Mixed hyperlipidemia: Secondary | ICD-10-CM

## 2022-05-07 NOTE — Telephone Encounter (Signed)
Per OV note on 02/19/22: She is having trouble with statin intolerance.  She is cutting her atorvastatin in half and seems to be tolerating the 10 mg dose reasonably well.  She is not tolerant to even low doses of rosuvastatin.  I have asked her to add Zetia 10 mg daily and we will repeat lipids and LFTs in 3 to 4 months.  Her goal LDL cholesterol is less than '70mg'$ /dL if she has been found to have moderate nonobstructive CAD on CT angiography  Patient is now stating that she has quit the Atorvastatin '10mg'$  and can not tolerate it. Per Dr Burt Knack verbal order in clinic, refer to [facility] for management.

## 2022-05-22 ENCOUNTER — Other Ambulatory Visit: Payer: Medicare HMO

## 2022-05-26 ENCOUNTER — Ambulatory Visit (HOSPITAL_COMMUNITY)
Admission: RE | Admit: 2022-05-26 | Discharge: 2022-05-26 | Disposition: A | Discharge status: Home / Self Care | Payer: Medicare HMO | Source: Ambulatory Visit | Attending: Family Medicine | Admitting: Family Medicine

## 2022-05-26 DIAGNOSIS — R200 Anesthesia of skin: Secondary | ICD-10-CM | POA: Insufficient documentation

## 2022-05-26 DIAGNOSIS — D3A010 Benign carcinoid tumor of the duodenum: Secondary | ICD-10-CM | POA: Diagnosis not present

## 2022-05-26 DIAGNOSIS — M5030 Other cervical disc degeneration, unspecified cervical region: Secondary | ICD-10-CM | POA: Diagnosis not present

## 2022-05-26 DIAGNOSIS — M542 Cervicalgia: Secondary | ICD-10-CM | POA: Diagnosis not present

## 2022-05-26 DIAGNOSIS — K5902 Outlet dysfunction constipation: Secondary | ICD-10-CM | POA: Diagnosis not present

## 2022-05-27 ENCOUNTER — Ambulatory Visit: Payer: Medicare HMO | Admitting: Family Medicine

## 2022-05-27 ENCOUNTER — Other Ambulatory Visit: Payer: Self-pay | Admitting: Family Medicine

## 2022-05-27 DIAGNOSIS — K581 Irritable bowel syndrome with constipation: Secondary | ICD-10-CM

## 2022-05-27 NOTE — Telephone Encounter (Signed)
Spoke to patient and she is now on the 145 mg dose of Linzess that was prescribed by GI doctor.  She will contact pharmacy to see if they sent the refill to them.  She will contact us if she has any issues.  Dm/cma

## 2022-05-28 ENCOUNTER — Encounter: Payer: Self-pay | Admitting: Family Medicine

## 2022-05-28 DIAGNOSIS — M47812 Spondylosis without myelopathy or radiculopathy, cervical region: Secondary | ICD-10-CM | POA: Insufficient documentation

## 2022-06-06 DIAGNOSIS — R112 Nausea with vomiting, unspecified: Secondary | ICD-10-CM | POA: Diagnosis not present

## 2022-06-06 DIAGNOSIS — K589 Irritable bowel syndrome without diarrhea: Secondary | ICD-10-CM | POA: Diagnosis not present

## 2022-06-06 DIAGNOSIS — Z9049 Acquired absence of other specified parts of digestive tract: Secondary | ICD-10-CM | POA: Diagnosis not present

## 2022-06-06 DIAGNOSIS — K219 Gastro-esophageal reflux disease without esophagitis: Secondary | ICD-10-CM | POA: Diagnosis not present

## 2022-06-06 DIAGNOSIS — R140 Abdominal distension (gaseous): Secondary | ICD-10-CM | POA: Diagnosis not present

## 2022-06-06 DIAGNOSIS — K529 Noninfective gastroenteritis and colitis, unspecified: Secondary | ICD-10-CM | POA: Diagnosis not present

## 2022-06-06 DIAGNOSIS — Z79899 Other long term (current) drug therapy: Secondary | ICD-10-CM | POA: Diagnosis not present

## 2022-06-06 DIAGNOSIS — R1319 Other dysphagia: Secondary | ICD-10-CM | POA: Diagnosis not present

## 2022-06-06 DIAGNOSIS — K5904 Chronic idiopathic constipation: Secondary | ICD-10-CM | POA: Diagnosis not present

## 2022-06-06 DIAGNOSIS — R1114 Bilious vomiting: Secondary | ICD-10-CM | POA: Diagnosis not present

## 2022-06-06 DIAGNOSIS — K5902 Outlet dysfunction constipation: Secondary | ICD-10-CM | POA: Diagnosis not present

## 2022-06-06 DIAGNOSIS — R1310 Dysphagia, unspecified: Secondary | ICD-10-CM | POA: Diagnosis not present

## 2022-06-09 ENCOUNTER — Encounter: Payer: Self-pay | Admitting: *Deleted

## 2022-06-09 DIAGNOSIS — K5904 Chronic idiopathic constipation: Secondary | ICD-10-CM | POA: Diagnosis not present

## 2022-06-10 ENCOUNTER — Ambulatory Visit: Payer: Medicare HMO | Admitting: Obstetrics and Gynecology

## 2022-06-12 ENCOUNTER — Telehealth: Payer: Self-pay | Admitting: Family Medicine

## 2022-06-12 NOTE — Telephone Encounter (Signed)
Left message for patient to call back and schedule Medicare Annual Wellness Visit (AWV).   Please offer to do virtually or by telephone.  Left office number and my jabber [phone].  Last AWV:06/30/2018  Please schedule at anytime with Nurse Health Advisor.

## 2022-06-15 NOTE — Progress Notes (Signed)
Patient ID: [name]                 DOB: [birth year]                    MRN: [id number]     HPI: Megan [name] is a 71 y.o. female patient referred to lipid clinic by Dr. Burt [name]. PMH is significant for adrenal adenoma, asthma, chest pain, depression, hyperlipidemia, IBS, tricuspid regurgitation, ASCVD, and hypothyroidism. Coronary CT 12/24/21 showed calcium score of 115, placing pt in the 72nd percentile. At last visit with Dr. Burt [name] on 02/19/22, pt reported statin intolerance to 5 mg of rosuvastatin daily, but was tolerating atorvastatin 10 mg daily. Zetia 10 mg daily was added at that visit. During a phone conversation afterwards, the patient reported intolerance to 10 mg of atorvastatin as well. She was then referred to lipid clinic for management in the setting of statin intolerance.   Patient presents today anxious in appearance. She reports chest pain worsening this past weekend. Chest pain begins in her left chest and radiates to her left shoulder blade and the left side of her neck. [place] improves or worsens pain and it is not associated with food or laying down. She also reports dizziness and blurred vision, which led her to drop a casserole dish on her hand this weekend. She mentioned that her husband has noticed that she has been more forgetful this past weekend, noting that she has abnormal words inappropriate for the sentences spoken and has been forgetting her train of thought mid-conversation which is abnormal for her. She has also caught herself doing unusual things like attempting to check her BP with her heating pad. She denies slurred speech She reports issues focusing when she has these spells. Forgetful spells have occurred 3 times over the past week. Pt repots that her BP has been elevated at home normally in the 829-562Z systolic and 30-865H diastolic. She admits to taking 50 mg of metop this AM instead of her usual 25 mg along with her husbands increased dose of  diliazem 240 mg this AM.   Patient reports an extensive history of statin intolerance to many doses of atorvastatin and rosuvastatin.  Current Lipid Medications: none Current HTN Medications: HCTZ 25 mg daily, Metoprolol ER 25 mg BID, Cardizem CD 180 mg daily, and Valsartan 40 mg daily Intolerances: atorvastatin 10-80 mg daily (headache, joint pain, muscle pain, GI symptoms 2/2 UC), rosuvastatin 10 mg daily (headache, joint pain, muscle pain, GI symptoms 2/2 UC), ezetimibe 10 mg daily (unknown) Risk Factors: CAC score in 72nd percentile LDL goal: <70 mg/dL given coronary CT results  Diet: not discussed due to time constraints  Exercise: not discussed due to time constraints  Family History:  Family History  Problem Relation Age of Onset   Colon cancer Mother    Diabetes Mother    Hypertension Mother    Colon polyps Father    Parkinson's disease Father    Colon cancer Maternal Grandmother    Breast cancer Maternal Grandmother 24   Diabetes Paternal Grandmother    Breast cancer Paternal Grandmother 51   Allergies Sister    Arthritis Brother        b/l hip replace   Alcohol abuse Daughter    Arthritis Son    Hypertension Son    Allergies Son    Osteoporosis Son    Osteoporosis Son    Arthritis Son        back disease, DDD  Allergies Son    Arthritis Son    Irritable bowel syndrome Son        RSD   Hypertension Paternal Grandfather    Heart disease Paternal Grandfather    Aneurysm Paternal Grandfather    Colon cancer Maternal Aunt    Colon cancer Cousin        X3   Ovarian cancer Cousin     Social History:  Social History   Socioeconomic History   Marital status: Married    Spouse name: Not on file   Number of children: 4   Years of education: 12   Highest education level: High school graduate  Occupational History   Occupation: Retired  Tobacco Use   Smoking status: Former    Types: Cigarettes    Quit date: 08/19/1995    Years since quitting: 26.8    Smokeless tobacco: Never  Vaping Use   Vaping Use: Never used  Substance and Sexual Activity   Alcohol use: Never    Alcohol/week: 0.0 standard drinks of alcohol   Drug use: Never   Sexual activity: Not Currently    Birth control/protection: Post-menopausal    Comment: 1st intercourse 53 yo-5 partners  Other Topics Concern   Not on file  Social History Narrative   Lives at home with her husband.   Right-handed.   2 cups caffeine per day.   Social Determinants of Health   Financial Resource Strain: Not on file  Food Insecurity: Not on file  Transportation Needs: Not on file  Physical Activity: Not on file  Stress: Not on file  Social Connections: Not on file  Intimate Partner Violence: Not on file    Labs:  Lipid Panel while taking atorvastatin 40 mg daily    Component Value Date/Time   CHOL 142 03/28/2022 0816   TRIG 93 03/28/2022 0816   HDL 48 03/28/2022 0816   CHOLHDL 3.0 03/28/2022 0816   CHOLHDL 3 07/29/2021 0803   VLDL 17.4 07/29/2021 0803   LDLCALC 77 03/28/2022 0816   LDLDIRECT 165.0 06/30/2011 1153   LABVLDL 17 03/28/2022 0816    LDL 154 on 07/29/2021, and LDL 167 on 09/11/2014.  Pt in unsure of the cholesterol regimen for either LDL.   Past Medical History:  Diagnosis Date   Adrenal adenoma 05/22/2014   Adrenal gland cyst ([place]) 05/22/2014   Allergic state 12/31/2012   Anxiety    Asthma    triggerred by fragrances    Barrett esophagus    Blind loop syndrome    C. difficile diarrhea    Cancer ([place])    pt denies    Chest pain, atypical 07/14/2011   Cold sore 07/17/2016   Colon polyp    Contact dermatitis 03/23/2012   Cystocele, midline    Depression    Diarrhea    Diverticulosis    Endometrial hyperplasia 02/27/2006   BENIGN ENDO BX ON 02/2007   GERD (gastroesophageal reflux disease)    Headache 04/16/2015   Headache    Headache(784.0) 04/15/2012   pt denies    Heart murmur    Hematuria 04/27/2012   Hepatic cyst    Hiatal hernia     Hiatal hernia    Hyperlipidemia, mixed 12/17/2014   Hypertension    IBS (irritable bowel syndrome)    Insomnia    Leaky heart valve    Low back pain 08/15/2013   Osteoporosis 03/2017   T score -2.5. 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  Paronychia of great toe, left 06/19/2013   Preventative health care 09/11/2014   Rectal bleeding 10/10/2011   Rectocele    Sacroiliac joint disease 04/27/2012   Sinusitis acute 02/03/2012   Thrush 07/21/2011   Tricuspid regurgitation, Mild-Moderate 07/21/2011   Moderate by 2 d echocardiogram Echo 11/2021: EF 60-65, no RWMA, normal RVSF, moderately elevated PASP, RVSP 52.1, trivial MR, mild to moderate TR   Unspecified hypothyroidism    Unspecified menopausal and postmenopausal disorder    Uterine prolapse without mention of vaginal wall prolapse    Vitamin B12 deficiency    Vitamin D deficiency 07/17/2016    Current Outpatient Medications on File Prior to Visit  Medication Sig Dispense Refill   albuterol (VENTOLIN HFA) 108 (90 Base) MCG/ACT inhaler TAKE 2 PUFFS BY MOUTH EVERY 6 HOURS AS NEEDED FOR WHEEZE OR SHORTNESS OF BREATH 8.5 each 1   ALPRAZolam (XANAX) 1 MG tablet TAKE 1 TABLET BY MOUTH TWICE A DAY AS NEEDED 30 tablet 1   Alum & Mag Hydroxide-Simeth (MYLANTA PO) Take by mouth. PRN     Alum Hydroxide-Mag Carbonate (GAVISCON PO) Take by mouth. PRN     aspirin EC 81 MG tablet Take 81 mg by mouth daily. Swallow whole.     Biotin 10 MG TABS Take by mouth.     cholecalciferol (VITAMIN D3) 25 MCG (1000 UNIT) tablet Take 5,000 Units by mouth daily.     cyclobenzaprine (FLEXERIL) 5 MG tablet Take 1 tablet (5 mg total) by mouth 3 (three) times daily as needed for muscle spasms. 60 tablet 1   dicyclomine (BENTYL) 10 MG capsule      diltiazem (CARDIZEM CD) 180 MG 24 hr capsule TAKE 1 CAPSULE BY MOUTH EVERY DAY 90 capsule 3   estradiol (ESTRACE) 0.1 MG/GM vaginal cream Place 0.5 g vaginally 2 (two) times a week. Place  0.5g nightly for two weeks then twice a week after 30 g 11   famotidine-calcium carbonate-magnesium hydroxide (PEPCID COMPLETE) 10-800-165 MG chewable tablet Chew 1 tablet by mouth daily as needed.     fluticasone (FLONASE) 50 MCG/ACT nasal spray Place 2 sprays into both nostrils daily. 16 g 6   gabapentin (NEURONTIN) 300 MG capsule TAKE 1 CAPSULE BY MOUTH THREE TIMES A DAY 90 capsule 5   hydrochlorothiazide (HYDRODIURIL) 25 MG tablet Take 1 tablet (25 mg total) by mouth daily. 90 tablet 3   linaclotide (LINZESS) 145 MCG CAPS capsule Take 1 capsule (145 mcg total) by mouth daily before breakfast. 30 capsule 2   metoprolol succinate (TOPROL-XL) 25 MG 24 hr tablet Take 1 tablet (25 mg total) by mouth in the morning and at bedtime. 180 tablet 3   Multiple Vitamin (MULTIVITAMIN) tablet Take 1 tablet by mouth daily.     omeprazole (PRILOSEC) 40 MG capsule Take 40 mg by mouth in the morning and at bedtime.     ondansetron (ZOFRAN) 4 MG tablet TAKE 1 TABLET BY MOUTH EVERY 8 HOURS AS NEEDED FOR NAUSEA AND VOMITING 20 tablet 3   polyethylene glycol (MIRALAX / GLYCOLAX) 17 g packet Take 17 g by mouth daily. 14 each 0   promethazine (PHENERGAN) 25 MG tablet Take 25 mg by mouth every 6 (six) hours as needed for nausea or vomiting.     tiZANidine (ZANAFLEX) 4 MG tablet Take 1 tablet (4 mg total) by mouth every 8 (eight) hours as needed for muscle spasms. 30 tablet 6   traZODone (DESYREL) 50 MG tablet TAKE 0.5-1 TABLETS BY MOUTH AT BEDTIME AS  NEEDED FOR SLEEP. 90 tablet 2   valsartan (DIOVAN) 80 MG tablet Take 0.5 tablets (40 mg total) by mouth daily. 90 tablet 1   No current facility-administered medications on file prior to visit.    Allergies  Allergen Reactions   Sulfamethoxazole Shortness Of Breath    chest tightness   Amitriptyline Anxiety and Other (See Comments)    Depression, insomnia   Cymbalta [Duloxetine Hcl] Other (See Comments)    Insomnia, constipation   Lactose Intolerance (Gi) Diarrhea  and Nausea And Vomiting   Lexapro [Escitalopram Oxalate]     nausea   Topamax [Topiramate] Itching   Prednisone Palpitations    Assessment/Plan:  1. Hyperlipidemia - Most recent LDL of 77 on atorvastatin 40 mg daily is above goal of <70, however patient is now on no medications given myalgias and GI symptoms.  Given intolerance to multiple statins and ezetimibe at various doses, will submit prior authorization for Repatha 140 mg every 14 days. Will contact patient upon approval and schedule 2 month lipid panel at that time.   2. Hypertension - BP of 158/78 with similar readings at home. Most recent labs were within normal limits. Instructed patient never to take more metoprolol or diltiazem than prescribed. The risks of bradycardia were described. Will continue HCTZ 25 mg daily, Metoprolol ER 25 mg BID, and Cardizem CD 180 mg daily. Will increase valsartan to 80 mg daily. Will follow up via telephone in 1 week to discuss home BP readings and in 3 weeks for labs. Will delay lipid and hepatic panels scheduled for 06/27/22 with new start of Repatha.   Given the patient's reported symptoms, this case was presented to Dr. Angelena Form (DOD) who ordered an ECG. ECG was normal. With patient's recent coronary CT with flow this year showing no flow-limiting disease, he recommended no further actions at this time. Patient is scheduled to see her PCP this upcoming Wednesday and was encouraged to explain symptoms to her PCP to evaluate non-cardiac causes.  Thank you for allowing pharmacy to participate in this patient's care.  Megan [name], PharmD PGY2 Pharmacy Resident 06/16/2022 5:24 PM

## 2022-06-16 ENCOUNTER — Ambulatory Visit: Payer: Medicare HMO | Attending: Cardiovascular Disease | Admitting: Pharmacist

## 2022-06-16 ENCOUNTER — Ambulatory Visit (INDEPENDENT_AMBULATORY_CARE_PROVIDER_SITE_OTHER): Payer: Medicare HMO | Admitting: Cardiovascular Disease

## 2022-06-16 ENCOUNTER — Encounter: Payer: Self-pay | Admitting: Cardiovascular Disease

## 2022-06-16 VITALS — BP 158/78 | HR 70

## 2022-06-16 DIAGNOSIS — R072 Precordial pain: Secondary | ICD-10-CM | POA: Diagnosis not present

## 2022-06-16 DIAGNOSIS — I10 Essential (primary) hypertension: Secondary | ICD-10-CM

## 2022-06-16 DIAGNOSIS — E782 Mixed hyperlipidemia: Secondary | ICD-10-CM

## 2022-06-16 MED ORDER — VALSARTAN 80 MG PO TABS: 80.0000 mg | ORAL_TABLET | Freq: Every day | ORAL | 1 refills | Status: DC

## 2022-06-16 NOTE — Patient Instructions (Addendum)
I will submit information to your insurance for Repatha and let you know when I hear back.    Repatha is a subcutaneous injection given once every 2 weeks in the fatty tissue of your stomach or upper outer thigh. Store the medication in the fridge. You can let your dose warm up to room temperature for 30 minutes before injecting if you prefer. Repatha will lower your LDL cholesterol by 60% and helps to lower your chance of having a heart attack or stroke.   Take blood pressure readings daily 2 hours after taking medications. Record readings. Will schedule labs in 3 weeks.  Follow up in clinic on 08/04/2022

## 2022-06-16 NOTE — Progress Notes (Signed)
Pt came in for Pharmacy appt and while here c/o chest pain over the weekend EKG done and reviewed by Dr Angelena Form no changes at this time continue current medical therapy ./cy

## 2022-06-17 ENCOUNTER — Telehealth: Payer: Self-pay | Admitting: Pharmacist

## 2022-06-17 ENCOUNTER — Encounter: Payer: Self-pay | Admitting: Pharmacist

## 2022-06-17 DIAGNOSIS — I700 Atherosclerosis of aorta: Secondary | ICD-10-CM

## 2022-06-17 DIAGNOSIS — E782 Mixed hyperlipidemia: Secondary | ICD-10-CM

## 2022-06-17 MED ORDER — REPATHA SURECLICK 140 MG/ML ~~LOC~~ SOAJ: 1.0000 mL | SUBCUTANEOUS | 3 refills | Status: DC

## 2022-06-17 NOTE — Telephone Encounter (Signed)
LVM  Called to informed Repatha PA approval  Approved until 08/18/2023 Lipid lab is due in 2 months after starting Repatha.

## 2022-06-17 NOTE — Telephone Encounter (Signed)
Repatha approved and sent to pharmacy

## 2022-06-18 ENCOUNTER — Encounter: Payer: Self-pay | Admitting: Pharmacist

## 2022-06-18 ENCOUNTER — Other Ambulatory Visit: Payer: Self-pay | Admitting: Family Medicine

## 2022-06-18 ENCOUNTER — Ambulatory Visit (INDEPENDENT_AMBULATORY_CARE_PROVIDER_SITE_OTHER): Payer: Medicare HMO | Admitting: Family Medicine

## 2022-06-18 ENCOUNTER — Encounter: Payer: Self-pay | Admitting: Family Medicine

## 2022-06-18 VITALS — BP 132/76 | HR 76 | Temp 97.6°F | Ht 66.0 in | Wt 144.8 lb

## 2022-06-18 DIAGNOSIS — Z85060 Personal history of malignant carcinoid tumor of small intestine: Secondary | ICD-10-CM | POA: Diagnosis not present

## 2022-06-18 DIAGNOSIS — K2100 Gastro-esophageal reflux disease with esophagitis, without bleeding: Secondary | ICD-10-CM | POA: Diagnosis not present

## 2022-06-18 DIAGNOSIS — E782 Mixed hyperlipidemia: Secondary | ICD-10-CM

## 2022-06-18 DIAGNOSIS — K5909 Other constipation: Secondary | ICD-10-CM | POA: Diagnosis not present

## 2022-06-18 DIAGNOSIS — F341 Dysthymic disorder: Secondary | ICD-10-CM

## 2022-06-18 NOTE — Progress Notes (Signed)
[facility] [address] [place] [address] Dept: [phone] Dept Fax: [phone]  Chronic Care Office Visit  Subjective:    Patient ID: Megan [name], female    DOB: Oct 10, 1950, 71 y.o..   MRN: [id number]  Chief Complaint  Patient presents with   Follow-up    4 week f/u.  Still having constipation issues.      History of Present Illness:  Patient is in today for reassessment of chronic medical issues.  Megan [name] has a history of chronic constipation. She notes that she continues to try and take a number of medicines to resolve this. Despite these efforts, she often goes 2-3 weeks between bowel movements. She has tried taking Miralax, magnesium citrate, Dulcolax, castor oil and linactolide (Linzess). She notes she has taken as much as 290 mg of linactolide without relief. She had recently used an enema. She states she did not have a bowel movement with this, but only saw mucous. Additionally, she has developed issues of severe heartburn with esophageal spasm. She notes this has flared considerably. She is having difficulty with eating and is sleeping propped up. She uses a Pepcid Complete and is now on rabeprazole 20 mg bid and sucralfate. She was seen 2 weeks ago by Mr. Renaldo Fiddler, PA-C with [facility]. Megan [name] has concerns that they are not really listening to her regarding the severity of her symptoms. she has a past history of an duodenal carcinoid tumor. She worries that she may have had a recurrence of this that is contributing to bowel obstruction.   Past Medical History: Patient Active Problem List   Diagnosis Date Noted   Cervical spondylosis 05/28/2022   Constipation by outlet dysfunction 05/26/2022   Degenerative disc disease, cervical 05/03/2022   Allergic rhinitis 12/31/2021   Pulmonary hypertension, unspecified ([place]) 12/22/2021   Mild cognitive impairment  12/18/2021   Genitourinary syndrome of menopause 10/25/2021   Arthralgia of left temporomandibular joint 04/15/2021   Lacunar infarction ([place]) 02/07/2021   Adrenal cortical nodule ([place]) 02/05/2021   Bladder rupture 12/10/2020   Urinary retention 11/21/2020   Cystocele with prolapse 11/15/2020   Asthma 11/05/2020   Uterine prolapse 11/05/2020   Episodic cluster headache, not intractable 09/14/2020   Chronic left-sided headache 08/09/2020   Cerebral vascular disease 08/09/2020   Depression with anxiety 08/09/2020   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   Closed fracture of right distal radius 01/12/2019   Moderate obstructive sleep apnea 05/21/2017   Abdominal aortic atherosclerosis ([place]) 07/16/2016   Renal artery stenosis ([place]) 06/12/2016   Chronic low back pain 08/24/2015   Precordial pain 06/04/2015   Hyperlipidemia, mixed 12/17/2014   Hepatic cyst 05/22/2014   Insomnia due to other mental disorder 03/10/2014   Pancreatic cyst 04/19/2013   Osteoporosis 09/07/2012   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   Palpitations 08/29/2011   Tricuspid regurgitation, Mild-Moderate 07/21/2011   Mitral regurgitation, Trivial 07/21/2011   Gastritis 06/17/2011   Endometrial hyperplasia    Diverticulosis    IBS (irritable bowel syndrome) with constipation 04/24/2011   Hiatal hernia 03/04/2011   Basal cell carcinoma (BCC) of face 11/15/2009   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   Essential hypertension 01/04/2009   Past Surgical History:  Procedure Laterality Date   APPENDECTOMY     endoscopic hemoclip     eye surgery Left    lens implant   HYSTEROSCOPY     POLYP   OPEN REDUCTION INTERNAL FIXATION (  ORIF) DISTAL RADIAL FRACTURE Left 01/17/2019   Procedure: LEFT OPEN REDUCTION INTERNAL FIXATION (ORIF) DISTAL RADIUS FRACTURE;  Surgeon: Daryll Brod, MD;  Location: [facility];  Service: Orthopedics;  Laterality: Left;  AXILLARY BLOCK   PERIPHERAL VASCULAR  CATHETERIZATION Left 07/24/2016   Procedure: Renal Angiography;  Surgeon: Conrad Taft, MD;  Location: [facility];  Service: Cardiovascular;  Laterality: Left;   RIGHT HEART CATH N/A 01/01/2022   Procedure: RIGHT HEART CATH;  Surgeon: Sherren Mocha, MD;  Location: [facility];  Service: Cardiovascular;  Laterality: N/A;   ROBOTIC ASSISTED BILATERAL SALPINGO OOPHERECTOMY  11/15/2020   ROBOTIC ASSISTED LAPAROSCOPIC SACROCOLPOPEXY Bilateral 11/15/2020   Procedure: XI ROBOTIC ASSISTED LAPAROSCOPIC SACROCOLPOPEXY AND SUPRACERVICIAL HYSTERECTOMY BILATERAL SALPINGO-OOPHERECTOMY;  Surgeon: Ardis Hughs, MD;  Location: [facility];  Service: Urology;  Laterality: Bilateral;  REQUESTING 4.5 HRS   ROBOTIC ASSISTED SUPRACERVICAL HYSTERECTOMY  11/15/2020   UPPER GI ENDOSCOPY     UTERINE FIBROID SURGERY     Family History  Problem Relation Age of Onset   Colon cancer Mother    Diabetes Mother    Hypertension Mother    Colon polyps Father    Parkinson's disease Father    Colon cancer Maternal Grandmother    Breast cancer Maternal Grandmother 34   Diabetes Paternal Grandmother    Breast cancer Paternal Grandmother 17   Allergies Sister    Arthritis Brother        b/l hip replace   Alcohol abuse Daughter    Arthritis Son    Hypertension Son    Allergies Son    Osteoporosis Son    Osteoporosis Son    Arthritis Son        back disease, DDD   Allergies Son    Arthritis Son    Irritable bowel syndrome Son        RSD   Hypertension Paternal Grandfather    Heart disease Paternal Grandfather    Aneurysm Paternal Grandfather    Colon cancer Maternal Aunt    Colon cancer Cousin        X3   Ovarian cancer Cousin    Outpatient Medications Prior to Visit  Medication Sig Dispense Refill   albuterol (VENTOLIN HFA) 108 (90 Base) MCG/ACT inhaler TAKE 2 PUFFS BY MOUTH EVERY 6 HOURS AS NEEDED FOR WHEEZE OR SHORTNESS OF BREATH 8.5 each 1   ALPRAZolam (XANAX) 1 MG tablet TAKE 1 TABLET BY  MOUTH TWICE A DAY AS NEEDED 30 tablet 1   Alum & Mag Hydroxide-Simeth (MYLANTA PO) Take by mouth. PRN     Alum Hydroxide-Mag Carbonate (GAVISCON PO) Take by mouth. PRN     aspirin EC 81 MG tablet Take 81 mg by mouth daily. Swallow whole.     Biotin 10 MG TABS Take by mouth.     cholecalciferol (VITAMIN D3) 25 MCG (1000 UNIT) tablet Take 5,000 Units by mouth daily.     cyclobenzaprine (FLEXERIL) 5 MG tablet Take 1 tablet (5 mg total) by mouth 3 (three) times daily as needed for muscle spasms. 60 tablet 1   dicyclomine (BENTYL) 10 MG capsule      diltiazem (CARDIZEM CD) 180 MG 24 hr capsule TAKE 1 CAPSULE BY MOUTH EVERY DAY 90 capsule 3   estradiol (ESTRACE) 0.1 MG/GM vaginal cream Place 0.5 g vaginally 2 (two) times a week. Place 0.5g nightly for two weeks then twice a week after 30 g 11   Evolocumab (REPATHA SURECLICK) 027 MG/ML SOAJ  Inject 140 mg into the skin every 14 (fourteen) days. 6 mL 3   famotidine-calcium carbonate-magnesium hydroxide (PEPCID COMPLETE) 10-800-165 MG chewable tablet Chew 1 tablet by mouth daily as needed.     fluticasone (FLONASE) 50 MCG/ACT nasal spray Place 2 sprays into both nostrils daily. 16 g 6   gabapentin (NEURONTIN) 300 MG capsule TAKE 1 CAPSULE BY MOUTH THREE TIMES A DAY 90 capsule 5   hydrochlorothiazide (HYDRODIURIL) 25 MG tablet Take 1 tablet (25 mg total) by mouth daily. 90 tablet 3   linaclotide (LINZESS) 145 MCG CAPS capsule Take 1 capsule (145 mcg total) by mouth daily before breakfast. 30 capsule 2   metoprolol succinate (TOPROL-XL) 25 MG 24 hr tablet Take 1 tablet (25 mg total) by mouth in the morning and at bedtime. 180 tablet 3   Multiple Vitamin (MULTIVITAMIN) tablet Take 1 tablet by mouth daily.     ondansetron (ZOFRAN) 4 MG tablet TAKE 1 TABLET BY MOUTH EVERY 8 HOURS AS NEEDED FOR NAUSEA AND VOMITING 20 tablet 3   polyethylene glycol (MIRALAX / GLYCOLAX) 17 g packet Take 17 g by mouth daily. 14 each 0   promethazine (PHENERGAN) 25 MG tablet Take 25  mg by mouth every 6 (six) hours as needed for nausea or vomiting.     RABEprazole (ACIPHEX) 20 MG tablet Take 20 mg by mouth 2 (two) times daily.     sucralfate (CARAFATE) 1 g tablet Take 1 g by mouth 4 (four) times daily.     tiZANidine (ZANAFLEX) 4 MG tablet Take 1 tablet (4 mg total) by mouth every 8 (eight) hours as needed for muscle spasms. 30 tablet 6   traZODone (DESYREL) 50 MG tablet TAKE 0.5-1 TABLETS BY MOUTH AT BEDTIME AS NEEDED FOR SLEEP. 90 tablet 2   valsartan (DIOVAN) 80 MG tablet Take 1 tablet (80 mg total) by mouth daily. 90 tablet 1   omeprazole (PRILOSEC) 40 MG capsule Take 40 mg by mouth in the morning and at bedtime.     Vitamin E (VITAMIN E/D-ALPHA NATURAL) 268 MG (400 UNIT) CAPS Take by mouth.     No facility-administered medications prior to visit.   Allergies  Allergen Reactions   Sulfamethoxazole Shortness Of Breath    chest tightness   Amitriptyline Anxiety and Other (See Comments)    Depression, insomnia   Cymbalta [Duloxetine Hcl] Other (See Comments)    Insomnia, constipation   Lactose Intolerance (Gi) Diarrhea and Nausea And Vomiting   Lexapro [Escitalopram Oxalate]     nausea   Topamax [Topiramate] Itching   Prednisone Palpitations      Objective:   Today's Vitals   06/18/22 1324  BP: 132/76  Pulse: 76  Temp: 97.6 F (36.4 C)  TempSrc: Temporal  SpO2: 98%  Weight: 144 lb 12.8 oz (65.7 kg)  Height: '5\' 6"'$  (1.676 m)   Body mass index is 23.37 kg/m.   General: Well developed, well nourished. No acute distress. Abdomen: Soft. Mild to moderate tenderness diffusely in the abdomen. Psych: Alert and oriented. Normal mood and affect.  Health Maintenance Due  Topic Date Due   Hepatitis C Screening  Never done   Zoster Vaccines- Shingrix (1 of 2) Never done   Pneumonia Vaccine 14+ Years old (1 - PCV) Never done   Medicare Annual Wellness (AWV)  07/01/2019     Assessment & Plan:   1. Gastroesophageal reflux disease with esophagitis without  hemorrhage 2. Chronic constipation 3. History of malignant carcinoid tumor of duodenum Megan [name]  feels quite frustrated with her ongoing GI complaints and the lack of progress in resolving her issues. I reviewed her recent consult note and lab results. She will continue linactolide 145 mg, 1-2 tabs daily. She will also continue rabeprazole 20 mg bid, Pepcid complete bid, and sucralfate 1 gm qid. I will try referring her to [facility]. Her symptoms have been severe enough, that she may need repeat endoscopy or other imaging ot make sure she has not experienced a carcinoid recurrence/metastasis.  - Ambulatory referral to Gastroenterology  Return in about 6 weeks (around 07/30/2022) for Reassessment.   Haydee Salter, MD

## 2022-06-20 ENCOUNTER — Encounter: Payer: Self-pay | Admitting: Family Medicine

## 2022-06-23 ENCOUNTER — Encounter: Payer: Self-pay | Admitting: Pharmacist

## 2022-06-23 ENCOUNTER — Telehealth: Payer: Self-pay | Admitting: Pharmacist

## 2022-06-23 NOTE — Telephone Encounter (Signed)
Attempted to call patient for evaluation of home BP readings. Will send message and attempt another call at a later date.

## 2022-06-25 ENCOUNTER — Other Ambulatory Visit: Payer: Self-pay | Admitting: Family Medicine

## 2022-06-25 DIAGNOSIS — J301 Allergic rhinitis due to pollen: Secondary | ICD-10-CM

## 2022-06-25 DIAGNOSIS — K219 Gastro-esophageal reflux disease without esophagitis: Secondary | ICD-10-CM | POA: Diagnosis not present

## 2022-06-25 DIAGNOSIS — R1319 Other dysphagia: Secondary | ICD-10-CM | POA: Diagnosis not present

## 2022-06-26 DIAGNOSIS — R1013 Epigastric pain: Secondary | ICD-10-CM | POA: Diagnosis not present

## 2022-06-26 DIAGNOSIS — R748 Abnormal levels of other serum enzymes: Secondary | ICD-10-CM | POA: Diagnosis not present

## 2022-06-27 ENCOUNTER — Other Ambulatory Visit: Payer: Medicare HMO

## 2022-06-27 ENCOUNTER — Other Ambulatory Visit: Payer: Self-pay | Admitting: Obstetrics and Gynecology

## 2022-06-27 DIAGNOSIS — M62838 Other muscle spasm: Secondary | ICD-10-CM

## 2022-07-01 NOTE — Progress Notes (Unsigned)
Patient: Megan [name] Date of Birth: 1951-04-18  Reason for Visit: Follow up History from: Patient, husband Primary Neurologist: Krista Blue   ASSESSMENT AND PLAN 71 y.o. year old female   1.  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  Left-sided headaches -Improved, continue gabapentin up to 300 mg twice a day, tizanidine as needed, encourage stretching, heat, massage -I offered neuromuscular physical therapy, wishes to hold off for now -Normal ESR, CRP -Previously tried and failed Topamax, Cymbalta -Follow-up with me in 6 months or sooner if needed  HISTORY  Megan [name] is a 71 years old female,  seen in request by her primary care physician PA Brunetta Jeans, [place] and ophthalmologist Dr. Warden Fillers for evaluation of headaches, initial evaluation was on April 07, 2018.   She has past medical history of depression anxiety, had intolerance to multiple different agents in the past, including amitriptyline, Effexor, but she denied previous history of headaches, early 2019, she began to have frequent left retro-orbital area pressure headaches, initially thought it was due to sinus issues, was evaluated by ENT Dr. Erik Obey, reported normal CT sinus in 2019.   She had left cataract surgery by Dr. Katy Fitch in April 2019, left vision overall recovered very well, but since her cataract surgery in April, left-sided headache has become worse, constant pressure, 5 out of 10 on a daily basis, often exacerbated to a more severe pounding headache with associated light noise sensitivity, nauseous, she tends to lying in bed for hours to help her headaches, she has tried  over-the-counter ibuprofen and Tylenol with limited help,   We personally reviewed MRI of the brain in May 2017 for evaluation of acute onset right facial droop and numbness, there was no acute abnormality mild supratentorium small vessel disease,    ESR C-reactive protein were normal in April 2019.   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   Update February 07, 2021 SS: Last seen Dec 2021, started Effexor-XR 37.5 mg, gabapentin 100 mg TID for migraine prevention, Fioricet PRN. CRP, sed rate, ANA were normal. Claims couldn't take Effexor, tried for 3 weeks, felt nauseated, palpations. Taking gabapentin 200 mg 2-3 times a day, does help. Reports daily ache to left parietal area, on good days can be dull, bad days is throbbing. When turns head to right sounds to her like click to left parietal area, not audible to anyone else. With headaches has nausea. At times is overwhelming to her, feels  like bad earache but in her head. Has IBS, colitis. Remains on aspirin 81 mg daily. Ice and heat help the parietal area. Claims doesn't do well with antidepressants. Admits to anxiety, is the go getter in her family. Didn't take Fioricet.    Update August 08, 2021 SS:  headaches still on left temple/parietal area, everyday, always dull but sometimes sharp pains throbbing. Gets nauseated. 4/7 days are bad headaches. Taking gabapentin 100 mg 3 times daily, the 300 mg twice daily gave her diarrhea with colitis. Claims blurry vision to the left eye, always, blurry vision for last 2 months. The eye doctor has cleared her.  The worse the headache gets the worse it gets. Takes Tylenol without benefit. Cymbalta didn't agree with her, restarted it again 2 weeks ago to try again, has taken edge off stress. Fioricet made her heart race. A lot of stress with her and her husband, she worries a lot about her family. On aspirin.   UPDATE Dec 18 2021: She continued complaints of significant left-sided headache, tightness sensation, difficulty turning, difficulty lying on her left side, worst at nighttime, she has tried Fioricet, Effexor, Cymbalta, only effective, could not tolerate due to side effect  She is taking gabapentin 300 mg twice daily, last dose was 5 PM, was not sure about the benefit, not a good candidate for NSAIDs due to acid reflux, heating pad always helps,   She also complains of lifelong history of anxiety, is a Research officer, trade [employer], noticed gradual onset of memory loss, slow reaction time, misplace things, MoCA examination 21/30,   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 Feels headaches are improving, left sided, mostly to the neck area, heating pad helps. Takes gabapentin 300 mg at bedtime if needed, has tizanidine if needed, rarely if neck tension. Doesn't have daily headaches, but has the neck pain. Is feeling harder of hearing.  MOCA 21/30 today. Get words mixed up, poor spelling, will catch herself. B 12 > 2000, TSH 1.690, RPR negative.  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  REVIEW OF SYSTEMS: Out of a complete 14 system review of symptoms, the patient complains only of the following symptoms, and all other reviewed systems are negative.  See HPI  ALLERGIES: Allergies  Allergen Reactions   Sulfamethoxazole Shortness Of Breath    chest tightness   Amitriptyline Anxiety and Other (See Comments)    Depression, insomnia   Cymbalta [Duloxetine Hcl] Other (See Comments)    Insomnia, constipation   Lactose Intolerance (Gi) Diarrhea and Nausea And Vomiting   Lexapro [Escitalopram Oxalate]     nausea   Topamax [Topiramate] Itching   Prednisone Palpitations    HOME MEDICATIONS: Outpatient Medications Prior to Visit  Medication Sig Dispense Refill   albuterol (VENTOLIN HFA) 108 (90 Base) MCG/ACT inhaler TAKE 2 PUFFS BY MOUTH EVERY 6 HOURS AS NEEDED FOR WHEEZE OR SHORTNESS OF BREATH 8.5 each 1   ALPRAZolam (XANAX) 1 MG tablet TAKE 1 TABLET BY MOUTH TWICE A DAY AS NEEDED 30 tablet 1   Alum & Mag Hydroxide-Simeth (MYLANTA PO) Take by mouth. PRN  Alum Hydroxide-Mag Carbonate (GAVISCON PO) Take by mouth. PRN     aspirin EC 81 MG tablet Take 81 mg by mouth daily. Swallow whole.     Biotin 10 MG TABS Take by mouth.     cholecalciferol (VITAMIN D3) 25 MCG (1000 UNIT) tablet Take 5,000 Units by mouth daily.     cyclobenzaprine (FLEXERIL) 5 MG tablet Take 1 tablet (5 mg total) by mouth 3 (three) times daily as needed for muscle spasms. 60 tablet 1   dicyclomine (BENTYL) 10 MG capsule      diltiazem (CARDIZEM CD) 180 MG 24 hr capsule TAKE 1 CAPSULE BY MOUTH EVERY DAY 90 capsule 3   estradiol (ESTRACE) 0.1 MG/GM vaginal cream Place 0.5 g vaginally 2 (two) times a week. Place 0.5g nightly for two weeks then twice a week after 30 g  11   Evolocumab (REPATHA SURECLICK) 409 MG/ML SOAJ Inject 140 mg into the skin every 14 (fourteen) days. 6 mL 3   famotidine-calcium carbonate-magnesium hydroxide (PEPCID COMPLETE) 10-800-165 MG chewable tablet Chew 1 tablet by mouth daily as needed.     fluticasone (FLONASE) 50 MCG/ACT nasal spray SPRAY 2 SPRAYS INTO EACH NOSTRIL EVERY DAY 48 mL 6   gabapentin (NEURONTIN) 300 MG capsule TAKE 1 CAPSULE BY MOUTH THREE TIMES A DAY 90 capsule 5   hydrochlorothiazide (HYDRODIURIL) 25 MG tablet Take 1 tablet (25 mg total) by mouth daily. 90 tablet 3   linaclotide (LINZESS) 145 MCG CAPS capsule Take 1 capsule (145 mcg total) by mouth daily before breakfast. 30 capsule 2   metoprolol succinate (TOPROL-XL) 25 MG 24 hr tablet Take 1 tablet (25 mg total) by mouth in the morning and at bedtime. 180 tablet 3   Multiple Vitamin (MULTIVITAMIN) tablet Take 1 tablet by mouth daily.     ondansetron (ZOFRAN) 4 MG tablet TAKE 1 TABLET BY MOUTH EVERY 8 HOURS AS NEEDED FOR NAUSEA AND VOMITING 20 tablet 3   polyethylene glycol (MIRALAX / GLYCOLAX) 17 g packet Take 17 g by mouth daily. 14 each 0   promethazine (PHENERGAN) 25 MG tablet Take 25 mg by mouth every 6 (six) hours as needed for nausea or vomiting.     RABEprazole (ACIPHEX) 20 MG tablet Take 20 mg by mouth 2 (two) times daily.     sucralfate (CARAFATE) 1 g tablet Take 1 g by mouth 4 (four) times daily.     tiZANidine (ZANAFLEX) 4 MG tablet Take 1 tablet (4 mg total) by mouth every 8 (eight) hours as needed for muscle spasms. 30 tablet 6   traZODone (DESYREL) 50 MG tablet TAKE 0.5-1 TABLETS BY MOUTH AT BEDTIME AS NEEDED FOR SLEEP. 90 tablet 2   valsartan (DIOVAN) 80 MG tablet Take 1 tablet (80 mg total) by mouth daily. 90 tablet 1   Vitamin E (VITAMIN E/D-ALPHA NATURAL) 268 MG (400 UNIT) CAPS Take by mouth.     No facility-administered medications prior to visit.    PAST MEDICAL HISTORY: Past Medical History:  Diagnosis Date   Adrenal adenoma 05/22/2014    Adrenal gland cyst ([place]) 05/22/2014   Allergic state 12/31/2012   Anxiety    Asthma    triggerred by fragrances    Barrett esophagus    Blind loop syndrome    C. difficile diarrhea    Cancer ([place])    pt denies    Chest pain, atypical 07/14/2011   Cold sore 07/17/2016   Colon polyp    Contact dermatitis 03/23/2012   Cystocele, midline  Depression    Diarrhea    Diverticulosis    Endometrial hyperplasia 02/27/2006   BENIGN ENDO BX ON 02/2007   GERD (gastroesophageal reflux disease)    Headache 04/16/2015   Headache    Headache(784.0) 04/15/2012   pt denies    Heart murmur    Hematuria 04/27/2012   Hepatic cyst    Hiatal hernia    Hiatal hernia    Hyperlipidemia, mixed 12/17/2014   Hypertension    IBS (irritable bowel syndrome)    Insomnia    Leaky heart valve    Low back pain 08/15/2013   Osteoporosis 03/2017   T score -2.5. 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   Sinusitis acute 02/03/2012   Thrush 07/21/2011   Tricuspid regurgitation, Mild-Moderate 07/21/2011   Moderate by 2 d echocardiogram Echo 11/2021: EF 60-65, no RWMA, normal RVSF, moderately elevated PASP, RVSP 52.1, trivial MR, mild to moderate TR   Unspecified hypothyroidism    Unspecified menopausal and postmenopausal disorder    Uterine prolapse without mention of vaginal wall prolapse    Vitamin B12 deficiency    Vitamin D deficiency 07/17/2016    PAST SURGICAL HISTORY: Past Surgical History:  Procedure Laterality Date   APPENDECTOMY     endoscopic hemoclip     eye surgery Left    lens implant   HYSTEROSCOPY     POLYP   OPEN REDUCTION INTERNAL FIXATION (ORIF) DISTAL RADIAL FRACTURE Left 01/17/2019   Procedure: LEFT OPEN REDUCTION INTERNAL FIXATION (ORIF) DISTAL RADIUS FRACTURE;  Surgeon: Daryll Brod,  MD;  Location: [facility];  Service: Orthopedics;  Laterality: Left;  AXILLARY BLOCK   PERIPHERAL VASCULAR CATHETERIZATION Left 07/24/2016   Procedure: Renal Angiography;  Surgeon: Conrad Mount Crawford, MD;  Location: [facility];  Service: Cardiovascular;  Laterality: Left;   RIGHT HEART CATH N/A 01/01/2022   Procedure: RIGHT HEART CATH;  Surgeon: Sherren Mocha, MD;  Location: [facility];  Service: Cardiovascular;  Laterality: N/A;   ROBOTIC ASSISTED BILATERAL SALPINGO OOPHERECTOMY  11/15/2020   ROBOTIC ASSISTED LAPAROSCOPIC SACROCOLPOPEXY Bilateral 11/15/2020   Procedure: XI ROBOTIC ASSISTED LAPAROSCOPIC SACROCOLPOPEXY AND SUPRACERVICIAL HYSTERECTOMY BILATERAL SALPINGO-OOPHERECTOMY;  Surgeon: Ardis Hughs, MD;  Location: [facility];  Service: Urology;  Laterality: Bilateral;  REQUESTING 4.5 HRS   ROBOTIC ASSISTED SUPRACERVICAL HYSTERECTOMY  11/15/2020   UPPER GI ENDOSCOPY     UTERINE FIBROID SURGERY      FAMILY HISTORY: Family History  Problem Relation Age of Onset   Colon cancer Mother    Diabetes Mother    Hypertension Mother    Colon polyps Father    Parkinson's disease Father    Colon cancer Maternal Grandmother    Breast cancer Maternal Grandmother 69   Diabetes Paternal Grandmother    Breast cancer Paternal Grandmother 85   Allergies Sister    Arthritis Brother        b/l hip replace   Alcohol abuse Daughter    Arthritis Son    Hypertension Son    Allergies Son    Osteoporosis Son    Osteoporosis Son    Arthritis Son        back disease, DDD   Allergies Son    Arthritis Son    Irritable bowel syndrome Son  RSD   Hypertension Paternal Grandfather    Heart disease Paternal Grandfather    Aneurysm Paternal Grandfather    Colon cancer Maternal Aunt    Colon cancer Cousin        X3   Ovarian cancer Cousin     SOCIAL HISTORY: Social History   Socioeconomic History   Marital status: Married    Spouse name: Not on file   Number of  children: 4   Years of education: 12   Highest education level: High school graduate  Occupational History   Occupation: Retired  Tobacco Use   Smoking status: Former    Types: Cigarettes    Quit date: 08/19/1995    Years since quitting: 26.8   Smokeless tobacco: Never  Vaping Use   Vaping Use: Never used  Substance and Sexual Activity   Alcohol use: Never    Alcohol/week: 0.0 standard drinks of alcohol   Drug use: Never   Sexual activity: Not Currently    Birth control/protection: Post-menopausal    Comment: 1st intercourse 77 yo-5 partners  Other Topics Concern   Not on file  Social History Narrative   Lives at home with her husband.   Right-handed.   2 cups caffeine per day.   Social Determinants of Health   Financial Resource Strain: Not on file  Food Insecurity: Not on file  Transportation Needs: Not on file  Physical Activity: Not on file  Stress: Not on file  Social Connections: Not on file  Intimate Partner Violence: Not on file    PHYSICAL EXAM  Vitals:   07/02/22 0828  BP: 139/65  Pulse: 71  Weight: 143 lb (64.9 kg)  Height: _0  (1.676 m)   Body mass index is 23.08 kg/m.    07/02/2022    8:33 AM 12/18/2021    8:00 AM  Montreal Cognitive Assessment   Visuospatial/ Executive (0/5) 3 3  Naming (0/3) 3 3  Attention: Read list of digits (0/2) 2 1  Attention: Read list of letters (0/1) 1 1  Attention: Serial 7 subtraction starting at 100 (0/3) 1 1  Language: Repeat phrase (0/2) 2 2  Language : Fluency (0/1) 1 1  Abstraction (0/2) 2 2  Delayed Recall (0/5) 0 1  Orientation (0/6) 6 6  Total 21 21   Generalized: Well developed, in no acute distress  Neurological examination  Mentation: Alert oriented to time, place, history taking. Follows all commands speech and language fluent, very nice pleasant Cranial nerve II-XII: Pupils were equal round reactive to light. Extraocular movements were full, visual field were full on confrontational test. Facial  sensation and strength were normal. Head turning and shoulder shrug  were normal and symmetric. Motor: The motor testing reveals 5 over 5 strength of all 4 extremities. Good symmetric motor tone is noted throughout.  Sensory: Sensory testing is intact to soft touch on all 4 extremities. No evidence of extinction is noted. Left nuchal line tenderness with palpation. Coordination: Cerebellar testing reveals good finger-nose-finger and heel-to-shin bilaterally.  Gait and station: Gait is normal.  Reflexes: Deep tendon reflexes are symmetric and normal bilaterally.   DIAGNOSTIC DATA (LABS, IMAGING, TESTING) - I reviewed patient records, labs, notes, testing and imaging myself where available.  Lab Results  Component Value Date   WBC 6.0 04/29/2022   HGB 14.6 04/29/2022   HCT 43.3 04/29/2022   MCV 94.0 04/29/2022   PLT 269.0 04/29/2022      Component Value Date/Time   NA 138 04/29/2022  1102   NA 135 12/30/2021 1441   K 4.1 04/29/2022 1102   CL 102 04/29/2022 1102   CO2 27 04/29/2022 1102   GLUCOSE 92 04/29/2022 1102   BUN 7 04/29/2022 1102   BUN 7 (L) 12/30/2021 1441   CREATININE 0.64 04/29/2022 1102   CREATININE 0.74 03/03/2017 1642   CALCIUM 9.5 04/29/2022 1102   CALCIUM 9.0 06/03/2011 0954   PROT 7.8 04/29/2022 1102   PROT 6.6 05/05/2019 0829   ALBUMIN 4.4 04/29/2022 1102   ALBUMIN 4.3 05/05/2019 0829   AST 17 04/29/2022 1102   ALT 15 04/29/2022 1102   ALKPHOS 100 04/29/2022 1102   BILITOT 0.4 04/29/2022 1102   BILITOT 0.2 05/05/2019 0829   GFRNONAA >60 11/23/2020 0434   GFRNONAA >89 08/01/2013 1646   GFRAA >60 03/30/2018 0953   GFRAA >89 08/01/2013 1646   Lab Results  Component Value Date   CHOL 142 03/28/2022   HDL 48 03/28/2022   LDLCALC 77 03/28/2022   LDLDIRECT 165.0 06/30/2011   TRIG 93 03/28/2022   CHOLHDL 3.0 03/28/2022   Lab Results  Component Value Date   HGBA1C 5.8 04/29/2022   Lab Results  Component Value Date   VITAMINB12 >2000 (H) 12/18/2021    Lab Results  Component Value Date   TSH 0.98 04/29/2022    Butler Denmark, AGNP-C, DNP 07/02/2022, 8:37 AM [facility] [address], [place], [place] [address] [phone]

## 2022-07-02 ENCOUNTER — Encounter: Payer: Self-pay | Admitting: Neurology

## 2022-07-02 ENCOUNTER — Ambulatory Visit: Payer: Medicare HMO | Admitting: Neurology

## 2022-07-02 VITALS — BP 139/65 | HR 71 | Ht 66.0 in | Wt 143.0 lb

## 2022-07-02 DIAGNOSIS — R519 Headache, unspecified: Secondary | ICD-10-CM

## 2022-07-02 DIAGNOSIS — G3184 Mild cognitive impairment, so stated: Secondary | ICD-10-CM

## 2022-07-02 MED ORDER — TIZANIDINE HCL 4 MG PO TABS: 4.0000 mg | ORAL_TABLET | Freq: Three times a day (TID) | ORAL | 2 refills | Status: DC | PRN

## 2022-07-02 MED ORDER — GABAPENTIN 300 MG PO CAPS: ORAL_CAPSULE | ORAL | 5 refills | Status: DC

## 2022-07-07 ENCOUNTER — Ambulatory Visit: Payer: Medicare HMO | Attending: Cardiovascular Disease

## 2022-07-07 DIAGNOSIS — I10 Essential (primary) hypertension: Secondary | ICD-10-CM | POA: Diagnosis not present

## 2022-07-08 LAB — BASIC METABOLIC PANEL
BUN/Creatinine Ratio: 12 (ref 12–28)
BUN: 8 mg/dL (ref 8–27)
CO2: 25 mmol/L (ref 20–29)
Calcium: 9.4 mg/dL (ref 8.7–10.3)
Chloride: 100 mmol/L (ref 96–106)
Creatinine, Ser: 0.68 mg/dL (ref 0.57–1.00)
Glucose: 92 mg/dL (ref 70–99)
Potassium: 4.3 mmol/L (ref 3.5–5.2)
Sodium: 140 mmol/L (ref 134–144)
eGFR: 93 mL/min/{1.73_m2} (ref >59)

## 2022-07-09 ENCOUNTER — Other Ambulatory Visit: Payer: Self-pay | Admitting: Neurology

## 2022-07-16 DIAGNOSIS — H35372 Puckering of macula, left eye: Secondary | ICD-10-CM | POA: Diagnosis not present

## 2022-07-16 DIAGNOSIS — H40023 Open angle with borderline findings, high risk, bilateral: Secondary | ICD-10-CM | POA: Diagnosis not present

## 2022-07-16 DIAGNOSIS — H0102A Squamous blepharitis right eye, upper and lower eyelids: Secondary | ICD-10-CM | POA: Diagnosis not present

## 2022-07-16 DIAGNOSIS — R519 Headache, unspecified: Secondary | ICD-10-CM | POA: Diagnosis not present

## 2022-07-16 DIAGNOSIS — Z961 Presence of intraocular lens: Secondary | ICD-10-CM | POA: Diagnosis not present

## 2022-07-16 DIAGNOSIS — H04123 Dry eye syndrome of bilateral lacrimal glands: Secondary | ICD-10-CM | POA: Diagnosis not present

## 2022-07-16 DIAGNOSIS — H40033 Anatomical narrow angle, bilateral: Secondary | ICD-10-CM | POA: Diagnosis not present

## 2022-07-16 DIAGNOSIS — H0102B Squamous blepharitis left eye, upper and lower eyelids: Secondary | ICD-10-CM | POA: Diagnosis not present

## 2022-07-16 DIAGNOSIS — H26493 Other secondary cataract, bilateral: Secondary | ICD-10-CM | POA: Diagnosis not present

## 2022-07-24 DIAGNOSIS — K2950 Unspecified chronic gastritis without bleeding: Secondary | ICD-10-CM | POA: Diagnosis not present

## 2022-07-24 DIAGNOSIS — R1013 Epigastric pain: Secondary | ICD-10-CM | POA: Diagnosis not present

## 2022-07-24 DIAGNOSIS — J449 Chronic obstructive pulmonary disease, unspecified: Secondary | ICD-10-CM | POA: Diagnosis not present

## 2022-07-24 DIAGNOSIS — K2970 Gastritis, unspecified, without bleeding: Secondary | ICD-10-CM | POA: Diagnosis not present

## 2022-07-24 DIAGNOSIS — Z9989 Dependence on other enabling machines and devices: Secondary | ICD-10-CM | POA: Diagnosis not present

## 2022-07-24 DIAGNOSIS — K449 Diaphragmatic hernia without obstruction or gangrene: Secondary | ICD-10-CM | POA: Diagnosis not present

## 2022-07-24 DIAGNOSIS — G4730 Sleep apnea, unspecified: Secondary | ICD-10-CM | POA: Diagnosis not present

## 2022-07-24 DIAGNOSIS — K2100 Gastro-esophageal reflux disease with esophagitis, without bleeding: Secondary | ICD-10-CM | POA: Diagnosis not present

## 2022-07-24 DIAGNOSIS — K3189 Other diseases of stomach and duodenum: Secondary | ICD-10-CM | POA: Diagnosis not present

## 2022-07-24 DIAGNOSIS — R110 Nausea: Secondary | ICD-10-CM | POA: Diagnosis not present

## 2022-07-24 DIAGNOSIS — R1319 Other dysphagia: Secondary | ICD-10-CM | POA: Diagnosis not present

## 2022-07-24 DIAGNOSIS — K219 Gastro-esophageal reflux disease without esophagitis: Secondary | ICD-10-CM | POA: Diagnosis not present

## 2022-07-24 DIAGNOSIS — K222 Esophageal obstruction: Secondary | ICD-10-CM | POA: Diagnosis not present

## 2022-07-30 ENCOUNTER — Encounter: Payer: Self-pay | Admitting: Family Medicine

## 2022-07-30 ENCOUNTER — Ambulatory Visit (INDEPENDENT_AMBULATORY_CARE_PROVIDER_SITE_OTHER): Payer: Medicare HMO | Admitting: Family Medicine

## 2022-07-30 VITALS — BP 130/72 | HR 92 | Temp 96.9°F | Ht 66.0 in | Wt 141.2 lb

## 2022-07-30 DIAGNOSIS — M5030 Other cervical disc degeneration, unspecified cervical region: Secondary | ICD-10-CM | POA: Diagnosis not present

## 2022-07-30 DIAGNOSIS — F418 Other specified anxiety disorders: Secondary | ICD-10-CM

## 2022-07-30 DIAGNOSIS — K2950 Unspecified chronic gastritis without bleeding: Secondary | ICD-10-CM | POA: Diagnosis not present

## 2022-07-30 DIAGNOSIS — K581 Irritable bowel syndrome with constipation: Secondary | ICD-10-CM | POA: Diagnosis not present

## 2022-07-30 DIAGNOSIS — K449 Diaphragmatic hernia without obstruction or gangrene: Secondary | ICD-10-CM | POA: Diagnosis not present

## 2022-07-30 MED ORDER — ONDANSETRON HCL 4 MG PO TABS: ORAL_TABLET | ORAL | 3 refills | Status: DC

## 2022-07-30 MED ORDER — PROMETHAZINE HCL 25 MG PO TABS: 25.0000 mg | ORAL_TABLET | Freq: Four times a day (QID) | ORAL | 2 refills | Status: DC | PRN

## 2022-07-30 MED ORDER — VILAZODONE HCL 10 MG PO TABS: 10.0000 mg | ORAL_TABLET | Freq: Every day | ORAL | 2 refills | Status: DC

## 2022-07-30 NOTE — Progress Notes (Signed)
[facility] [address] [place] [address] Dept: [phone] Dept Fax: [phone]  Chronic Care Office Visit  Subjective:    Patient ID: Megan [name], female    DOB: 26-Jun-1951, 71 y.o..   MRN: [id number]  Chief Complaint  Patient presents with   Follow-up    6 week f/u.       History of Present Illness:  Patient is in today for reassessment of chronic medical issues.  Megan [name] has a history severe heartburn with esophageal spasm. She notes this has flared considerably. She continues to have difficulty with eating and is sleeping propped up. She uses a Pepcid Complete, rabeprazole 20 mg bid and sucralfate 1 gm qid. She continues to work with Mr. Renaldo Fiddler, PA-C with [facility]. She had a recent EGD which did show a lower esophageal stricture and gastritis. The stricture was dilated. Megan [name] notes that she continues to have significant symptoms despite her medications. She states Mr. Renaldo Fiddler plans further work-up.  Megan [name] has a history of cervical pain. She had an MRI earlier this Fall and notes she has not had a chance to discuss results.   Megan [name] has a history of anxiety with depression. She has been tried on multiple agents in the past. These were either stopped due to ineffectiveness or side effects. This includes failure with escitalopram, duloxetine, and desvenlafaxine. She had a previous genetic panel ([employer]) performed in Jan. 2022.  This showed she would be a good candidate for use of desvenlafaxine, levomilnacipran, or vilazodone.  Megan [name] has a history of IBS with constipation. She notes her Linzess has been increased to 240 mcg tabs. She notes she does finally have bowel movements, but they are not well formed.  Past Medical History: Patient Active Problem List   Diagnosis Date Noted   Cervical spondylosis 05/28/2022    Constipation by outlet dysfunction 05/26/2022   Degenerative disc disease, cervical 05/03/2022   Allergic rhinitis 12/31/2021   Pulmonary hypertension, unspecified ([place]) 12/22/2021   Mild cognitive impairment 12/18/2021   Genitourinary syndrome of menopause 10/25/2021   Arthralgia of left temporomandibular joint 04/15/2021   Lacunar infarction ([place]) 02/07/2021   Adrenal cortical nodule ([place]) 02/05/2021   Bladder rupture 12/10/2020   Urinary retention 11/21/2020   Cystocele with prolapse 11/15/2020   Asthma 11/05/2020   Uterine prolapse 11/05/2020   Episodic cluster headache, not intractable 09/14/2020   Chronic left-sided headache 08/09/2020   Cerebral vascular disease 08/09/2020   Depression with anxiety 08/09/2020   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   Closed fracture of right distal radius 01/12/2019   Moderate obstructive sleep apnea 05/21/2017   Abdominal aortic atherosclerosis ([place]) 07/16/2016   Renal artery stenosis ([place]) 06/12/2016   Chronic low back pain 08/24/2015   Precordial pain 06/04/2015   Hyperlipidemia, mixed 12/17/2014   Hepatic cyst 05/22/2014   Insomnia due to other mental disorder 03/10/2014   Pancreatic cyst 04/19/2013   Osteoporosis 09/07/2012   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   Palpitations 08/29/2011   Tricuspid regurgitation, Mild-Moderate 07/21/2011   Mitral regurgitation, Trivial 07/21/2011   Gastritis 06/17/2011   Endometrial hyperplasia    Diverticulosis    IBS (irritable bowel syndrome) with constipation 04/24/2011   Hiatal hernia 03/04/2011   Basal cell carcinoma (BCC) of face 11/15/2009   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   Essential hypertension 01/04/2009   Past Surgical History:  Procedure Laterality Date   APPENDECTOMY  endoscopic hemoclip     eye surgery Left    lens implant   HYSTEROSCOPY     POLYP   OPEN REDUCTION INTERNAL FIXATION (ORIF) DISTAL RADIAL FRACTURE Left 01/17/2019   Procedure:  LEFT OPEN REDUCTION INTERNAL FIXATION (ORIF) DISTAL RADIUS FRACTURE;  Surgeon: Daryll Brod, MD;  Location: [facility];  Service: Orthopedics;  Laterality: Left;  AXILLARY BLOCK   PERIPHERAL VASCULAR CATHETERIZATION Left 07/24/2016   Procedure: Renal Angiography;  Surgeon: Conrad Duarte, MD;  Location: [facility];  Service: Cardiovascular;  Laterality: Left;   RIGHT HEART CATH N/A 01/01/2022   Procedure: RIGHT HEART CATH;  Surgeon: Sherren Mocha, MD;  Location: [facility];  Service: Cardiovascular;  Laterality: N/A;   ROBOTIC ASSISTED BILATERAL SALPINGO OOPHERECTOMY  11/15/2020   ROBOTIC ASSISTED LAPAROSCOPIC SACROCOLPOPEXY Bilateral 11/15/2020   Procedure: XI ROBOTIC ASSISTED LAPAROSCOPIC SACROCOLPOPEXY AND SUPRACERVICIAL HYSTERECTOMY BILATERAL SALPINGO-OOPHERECTOMY;  Surgeon: Ardis Hughs, MD;  Location: [facility];  Service: Urology;  Laterality: Bilateral;  REQUESTING 4.5 HRS   ROBOTIC ASSISTED SUPRACERVICAL HYSTERECTOMY  11/15/2020   UPPER GI ENDOSCOPY     UTERINE FIBROID SURGERY     Family History  Problem Relation Age of Onset   Colon cancer Mother    Diabetes Mother    Hypertension Mother    Colon polyps Father    Parkinson's disease Father    Colon cancer Maternal Grandmother    Breast cancer Maternal Grandmother 30   Diabetes Paternal Grandmother    Breast cancer Paternal Grandmother 73   Allergies Sister    Arthritis Brother        b/l hip replace   Alcohol abuse Daughter    Arthritis Son    Hypertension Son    Allergies Son    Osteoporosis Son    Osteoporosis Son    Arthritis Son        back disease, DDD   Allergies Son    Arthritis Son    Irritable bowel syndrome Son        RSD   Hypertension Paternal Grandfather    Heart disease Paternal Grandfather    Aneurysm Paternal Grandfather    Colon cancer Maternal Aunt    Colon cancer Cousin        X3   Ovarian cancer Cousin    Outpatient Medications Prior to Visit  Medication Sig  Dispense Refill   albuterol (VENTOLIN HFA) 108 (90 Base) MCG/ACT inhaler TAKE 2 PUFFS BY MOUTH EVERY 6 HOURS AS NEEDED FOR WHEEZE OR SHORTNESS OF BREATH 8.5 each 1   ALPRAZolam (XANAX) 1 MG tablet TAKE 1 TABLET BY MOUTH TWICE A DAY AS NEEDED 30 tablet 1   Alum & Mag Hydroxide-Simeth (MYLANTA PO) Take by mouth. PRN     Alum Hydroxide-Mag Carbonate (GAVISCON PO) Take by mouth. PRN     aspirin EC 81 MG tablet Take 81 mg by mouth daily. Swallow whole.     Biotin 10 MG TABS Take by mouth.     cholecalciferol (VITAMIN D3) 25 MCG (1000 UNIT) tablet Take 5,000 Units by mouth daily.     cyclobenzaprine (FLEXERIL) 5 MG tablet Take 1 tablet (5 mg total) by mouth 3 (three) times daily as needed for muscle spasms. 60 tablet 1   dicyclomine (BENTYL) 10 MG capsule      diltiazem (CARDIZEM CD) 180 MG 24 hr capsule TAKE 1 CAPSULE BY MOUTH EVERY DAY 90 capsule 3   estradiol (ESTRACE) 0.1 MG/GM vaginal cream Place 0.5 g  vaginally 2 (two) times a week. Place 0.5g nightly for two weeks then twice a week after 30 g 11   Evolocumab (REPATHA SURECLICK) 742 MG/ML SOAJ Inject 140 mg into the skin every 14 (fourteen) days. 6 mL 3   famotidine-calcium carbonate-magnesium hydroxide (PEPCID COMPLETE) 10-800-165 MG chewable tablet Chew 1 tablet by mouth daily as needed.     fluticasone (FLONASE) 50 MCG/ACT nasal spray SPRAY 2 SPRAYS INTO EACH NOSTRIL EVERY DAY 48 mL 6   gabapentin (NEURONTIN) 300 MG capsule Take 1 capsule twice daily 60 capsule 5   hydrochlorothiazide (HYDRODIURIL) 25 MG tablet Take 1 tablet (25 mg total) by mouth daily. 90 tablet 3   LINZESS 290 MCG CAPS capsule Take 290 mcg by mouth daily.     metoprolol succinate (TOPROL-XL) 25 MG 24 hr tablet Take 1 tablet (25 mg total) by mouth in the morning and at bedtime. 180 tablet 3   Multiple Vitamin (MULTIVITAMIN) tablet Take 1 tablet by mouth daily.     ondansetron (ZOFRAN) 4 MG tablet TAKE 1 TABLET BY MOUTH EVERY 8 HOURS AS NEEDED FOR NAUSEA AND VOMITING 20  tablet 3   polyethylene glycol (MIRALAX / GLYCOLAX) 17 g packet Take 17 g by mouth daily. 14 each 0   promethazine (PHENERGAN) 25 MG tablet Take 25 mg by mouth every 6 (six) hours as needed for nausea or vomiting.     RABEprazole (ACIPHEX) 20 MG tablet Take 20 mg by mouth 2 (two) times daily.     sucralfate (CARAFATE) 1 g tablet Take 1 g by mouth 4 (four) times daily.     tiZANidine (ZANAFLEX) 4 MG tablet Take 1 tablet (4 mg total) by mouth every 8 (eight) hours as needed for muscle spasms. 30 tablet 2   traZODone (DESYREL) 50 MG tablet TAKE 0.5-1 TABLETS BY MOUTH AT BEDTIME AS NEEDED FOR SLEEP. 90 tablet 2   valsartan (DIOVAN) 80 MG tablet Take 1 tablet (80 mg total) by mouth daily. 90 tablet 1   Vitamin E (VITAMIN E/D-ALPHA NATURAL) 268 MG (400 UNIT) CAPS Take by mouth.     linaclotide (LINZESS) 145 MCG CAPS capsule Take 1 capsule (145 mcg total) by mouth daily before breakfast. 30 capsule 2   butalbital-acetaminophen-caffeine (FIORICET) 50-325-40 MG tablet TAKE 1 TABLET BY MOUTH EVERY 6 HOURS AS NEEDED FOR HEADACHE. (Patient not taking: Reported on 07/30/2022) 10 tablet 3   No facility-administered medications prior to visit.   Allergies  Allergen Reactions   Sulfamethoxazole Shortness Of Breath    chest tightness   Amitriptyline Anxiety and Other (See Comments)    Depression, insomnia   Cymbalta [Duloxetine Hcl] Other (See Comments)    Insomnia, constipation   Lactose Intolerance (Gi) Diarrhea and Nausea And Vomiting   Lexapro [Escitalopram Oxalate]     nausea   Topamax [Topiramate] Itching   Prednisone Palpitations     Objective:   Today's Vitals   07/30/22 1310  BP: 130/72  Pulse: 92  Temp: (!) 96.9 F (36.1 C)  TempSrc: Temporal  SpO2: 99%  Weight: 141 lb 3.2 oz (64 kg)  Height: '5\' 6"'$  (1.676 m)   Body mass index is 22.79 kg/m.   General: Well developed, well nourished. No acute distress. Psych: Alert and oriented. Normal mood and affect.  Health Maintenance Due   Topic Date Due   Hepatitis C Screening  Never done   Pneumonia Vaccine 17+ Years old (1 - PCV) Never done   Medicare Annual Wellness (AWV)  07/01/2019  Imaging: MR Cervical Spine wo contrast (05/26/2022) IMPRESSION: Disc degeneration and spondylosis at C4-5 and C5-6. Mild spinal stenosis and foraminal stenosis bilaterally at both levels.    Assessment & Plan:   1. 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 She will follow-up with GI at [facility]. We had considered referral to [facility], but she wants to stick with her current GI team a bit longer.  2. Hiatal hernia Discussed issues of lifestyle modification to manage with symptoms related to her hiatal hernia.  3. Degenerative disc disease, cervical Reviewed MR results. Symptoms are bothersome, but she prefers to see if she can resolve her reflux issues first before pursuing neurosurgical consultation.  4. Depression with anxiety Ms. Delellis continues to struggle with her depression issues. I reviewed [employer] Psychotropic results from 2022. We will try starting her on vilazodone. I will start low and plan to only gradually titrate, as she has had tolerance issues with other psych meds.  - Vilazodone HCl (VIIBRYD) 10 MG TABS; Take 1 tablet (10 mg total) by mouth daily.  Dispense: 30 tablet; Refill: 2   Return in about 6 weeks (around 09/10/2022) for Reassessment.   Haydee Salter, MD

## 2022-07-31 ENCOUNTER — Other Ambulatory Visit (HOSPITAL_COMMUNITY): Payer: Self-pay

## 2022-07-31 ENCOUNTER — Telehealth: Payer: Self-pay

## 2022-07-31 NOTE — Telephone Encounter (Signed)
Pharmacy Patient Advocate Encounter   Received notification from [facility] that prior authorization for Vilazodone '10mg'$   is required/requested.    PA submitted on 07/31/22 to [facility] via [place] Status is pending

## 2022-08-01 ENCOUNTER — Other Ambulatory Visit (HOSPITAL_COMMUNITY): Payer: Self-pay

## 2022-08-01 DIAGNOSIS — K2970 Gastritis, unspecified, without bleeding: Secondary | ICD-10-CM | POA: Diagnosis not present

## 2022-08-01 NOTE — Telephone Encounter (Signed)
Received notification from [facility]  regarding a prior authorization for Vilazodone '10mg'$   Authorization has been APPROVED from 08/18/21 to 08/18/23.   Authorization #  PA Case ID: [id number]

## 2022-08-01 NOTE — Telephone Encounter (Signed)
Pharmacy notified [place] phone. Dm/cma

## 2022-08-04 ENCOUNTER — Ambulatory Visit: Payer: Medicare HMO

## 2022-08-04 ENCOUNTER — Other Ambulatory Visit (HOSPITAL_COMMUNITY): Payer: Self-pay

## 2022-08-04 ENCOUNTER — Telehealth: Payer: Self-pay | Admitting: Family Medicine

## 2022-08-04 NOTE — Telephone Encounter (Signed)
Spoke to patient after conferring with provider, he states that she should really call the GI doctor and may try to use an enema to see if it helps with the constipation.  Patient agreeable with recommendations.  Dm/cma

## 2022-08-04 NOTE — Telephone Encounter (Signed)
Caller Name: Naira Call back phone #: [phone]  Reason for Call: hurting side & stomach, cannot have a bowel movement x 8 days, went to ER but it too busy so she left, spoke to [place] and pt was advised she may need to go back to ER, Pt states she has taken laxatives, miralax, and stool softeners already. She said pain got worse after CT exam 12/15.  Please call with provider recommendation (pt declined to speak with RN triage)

## 2022-08-07 ENCOUNTER — Telehealth: Payer: Self-pay | Admitting: Family Medicine

## 2022-08-07 ENCOUNTER — Encounter (HOSPITAL_BASED_OUTPATIENT_CLINIC_OR_DEPARTMENT_OTHER): Payer: Self-pay | Admitting: Emergency Medicine

## 2022-08-07 ENCOUNTER — Emergency Department (HOSPITAL_BASED_OUTPATIENT_CLINIC_OR_DEPARTMENT_OTHER)
Admission: EM | Admit: 2022-08-07 | Discharge: 2022-08-07 | Disposition: A | Discharge status: Home / Self Care | Payer: Medicare HMO | Attending: Emergency Medicine | Admitting: Emergency Medicine

## 2022-08-07 ENCOUNTER — Emergency Department (HOSPITAL_BASED_OUTPATIENT_CLINIC_OR_DEPARTMENT_OTHER): Payer: Medicare HMO

## 2022-08-07 ENCOUNTER — Other Ambulatory Visit: Payer: Self-pay

## 2022-08-07 DIAGNOSIS — R109 Unspecified abdominal pain: Secondary | ICD-10-CM | POA: Diagnosis not present

## 2022-08-07 DIAGNOSIS — R1013 Epigastric pain: Secondary | ICD-10-CM

## 2022-08-07 DIAGNOSIS — F418 Other specified anxiety disorders: Secondary | ICD-10-CM

## 2022-08-07 DIAGNOSIS — K5730 Diverticulosis of large intestine without perforation or abscess without bleeding: Secondary | ICD-10-CM | POA: Diagnosis not present

## 2022-08-07 LAB — CBC
HCT: 38.5 % (ref 36.0–46.0)
Hemoglobin: 13 g/dL (ref 12.0–15.0)
MCH: 31.3 pg (ref 26.0–34.0)
MCHC: 33.8 g/dL (ref 30.0–36.0)
MCV: 92.8 fL (ref 80.0–100.0)
Platelets: 329 10*3/uL (ref 150–400)
RBC: 4.15 MIL/uL (ref 3.87–5.11)
RDW: 11.8 % (ref 11.5–15.5)
WBC: 7.7 10*3/uL (ref 4.0–10.5)
nRBC: 0 % (ref 0.0–0.2)

## 2022-08-07 LAB — COMPREHENSIVE METABOLIC PANEL
ALT: 19 U/L (ref 0–44)
AST: 27 U/L (ref 15–41)
Albumin: 3.9 g/dL (ref 3.5–5.0)
Alkaline Phosphatase: 103 U/L (ref 38–126)
Anion gap: 8 (ref 5–15)
BUN: 6 mg/dL — ABNORMAL LOW (ref 8–23)
CO2: 27 mmol/L (ref 22–32)
Calcium: 9.5 mg/dL (ref 8.9–10.3)
Chloride: 99 mmol/L (ref 98–111)
Creatinine, Ser: 0.62 mg/dL (ref 0.44–1.00)
GFR, Estimated: >60 mL/min (ref >60)
Glucose, Bld: 107 mg/dL — ABNORMAL HIGH (ref 70–99)
Potassium: 3.5 mmol/L (ref 3.5–5.1)
Sodium: 134 mmol/L — ABNORMAL LOW (ref 135–145)
Total Bilirubin: 0.3 mg/dL (ref 0.3–1.2)
Total Protein: 7.5 g/dL (ref 6.5–8.1)

## 2022-08-07 LAB — LIPASE, BLOOD: Lipase: 32 U/L (ref 11–51)

## 2022-08-07 LAB — URINALYSIS, MICROSCOPIC (REFLEX)

## 2022-08-07 LAB — URINALYSIS, ROUTINE W REFLEX MICROSCOPIC
Bilirubin Urine: NEGATIVE
Glucose, UA: NEGATIVE mg/dL
Hgb urine dipstick: NEGATIVE
Ketones, ur: NEGATIVE mg/dL
Nitrite: NEGATIVE
Protein, ur: NEGATIVE mg/dL
Specific Gravity, Urine: 1.015 (ref 1.005–1.030)
pH: 7 (ref 5.0–8.0)

## 2022-08-07 MED ORDER — SUCRALFATE 1 GM/10ML PO SUSP: 1.0000 g | Freq: Three times a day (TID) | ORAL | Status: DC

## 2022-08-07 MED ORDER — ALUM & MAG HYDROXIDE-SIMETH 200-200-20 MG/5ML PO SUSP
30.0000 mL | Freq: Once | ORAL | Status: AC
  Administered 2022-08-07: 30 mL via ORAL
  Filled 2022-08-07: qty 30

## 2022-08-07 MED ORDER — SUCRALFATE 1 GM/10ML PO SUSP: 1.0000 g | Freq: Three times a day (TID) | ORAL | 1 refills | Status: DC

## 2022-08-07 NOTE — ED Provider Notes (Addendum)
[facility] DEPARTMENT Provider Note   CSN: [id number] Arrival date & time: 08/07/22  1421     History  Chief Complaint  Patient presents with   Abdominal Pain    Megan [name] is a 71 y.o. female.  HPI Patient has had problems with recurrent abdominal pain.  She had an upper endoscopy 12\7.  She reports some biopsies were done.  She then had a follow-up CT scan with contrast 1215.  Reports after the CT scan with contrast when she really started getting a lot of epigastric pain.  She reports she is also has history of a esophageal dilation.  However, she still feels like things sometimes get stuck when she swallowing.  She has tried multiple different types of medications but continues to have symptoms.  No vomiting, no fever, no chills.  He has been nauseated.  Some loose stool over the past couple of days.    Home Medications Prior to Admission medications   Medication Sig Start Date End Date Taking? Authorizing Provider  sucralfate (CARAFATE) 1 GM/10ML suspension Take 10 mLs (1 g total) by mouth 4 (four) times daily -  with meals and at bedtime. 08/07/22  Yes Charlesetta Shanks, MD  albuterol (VENTOLIN HFA) 108 (90 Base) MCG/ACT inhaler TAKE 2 PUFFS BY MOUTH EVERY 6 HOURS AS NEEDED FOR WHEEZE OR SHORTNESS OF BREATH 10/08/21   Haydee Salter, MD  ALPRAZolam Duanne Moron) 1 MG tablet TAKE 1 TABLET BY MOUTH TWICE A DAY AS NEEDED 06/18/22   Haydee Salter, MD  Alum & Mag Hydroxide-Simeth (MYLANTA PO) Take by mouth. PRN    [provider]  Alum Hydroxide-Mag Carbonate (GAVISCON PO) Take by mouth. PRN    [provider]  aspirin EC 81 MG tablet Take 81 mg by mouth daily. Swallow whole.    [provider]  Biotin 10 MG TABS Take by mouth.    [provider]  butalbital-acetaminophen-caffeine (FIORICET) 50-325-40 MG tablet TAKE 1 TABLET BY MOUTH EVERY 6 HOURS AS NEEDED FOR HEADACHE. Patient not taking: Reported on 07/30/2022 07/14/22    Suzzanne Cloud, NP  cholecalciferol (VITAMIN D3) 25 MCG (1000 UNIT) tablet Take 5,000 Units by mouth daily.    [provider]  cyclobenzaprine (FLEXERIL) 5 MG tablet Take 1 tablet (5 mg total) by mouth 3 (three) times daily as needed for muscle spasms. 04/16/22   Jaquita Folds, MD  dicyclomine (BENTYL) 10 MG capsule  12/14/20   [provider]  diltiazem (CARDIZEM CD) 180 MG 24 hr capsule TAKE 1 CAPSULE BY MOUTH EVERY DAY 02/10/22   Sherren Mocha, MD  estradiol (ESTRACE) 0.1 MG/GM vaginal cream Place 0.5 g vaginally 2 (two) times a week. Place 0.5g nightly for two weeks then twice a week after 10/10/21   Jaquita Folds, MD  Evolocumab (REPATHA SURECLICK) 638 MG/ML SOAJ Inject 140 mg into the skin every 14 (fourteen) days. 06/17/22   Pavero, Harrell Gave, RPH  famotidine-calcium carbonate-magnesium hydroxide (PEPCID COMPLETE) 10-800-165 MG chewable tablet Chew 1 tablet by mouth daily as needed.    [provider]  fluticasone (FLONASE) 50 MCG/ACT nasal spray SPRAY 2 SPRAYS INTO EACH NOSTRIL EVERY DAY 06/25/22   Haydee Salter, MD  gabapentin (NEURONTIN) 300 MG capsule Take 1 capsule twice daily 07/02/22   Suzzanne Cloud, NP  hydrochlorothiazide (HYDRODIURIL) 25 MG tablet Take 1 tablet (25 mg total) by mouth daily. 04/29/22   Haydee Salter, MD  LINZESS 290 MCG CAPS capsule Take  290 mcg by mouth daily.    [provider]  metoprolol succinate (TOPROL-XL) 25 MG 24 hr tablet Take 1 tablet (25 mg total) by mouth in the morning and at bedtime. 02/19/22   Sherren Mocha, MD  Multiple Vitamin (MULTIVITAMIN) tablet Take 1 tablet by mouth daily.    [provider]  ondansetron (ZOFRAN) 4 MG tablet TAKE 1 TABLET BY MOUTH EVERY 8 HOURS AS NEEDED FOR NAUSEA AND VOMITING 07/30/22   Haydee Salter, MD  polyethylene glycol (MIRALAX / GLYCOLAX) 17 g packet Take 17 g by mouth daily. 11/24/20   Ardis Hughs, MD  promethazine (PHENERGAN) 25 MG tablet Take 1  tablet (25 mg total) by mouth every 6 (six) hours as needed for nausea or vomiting. 07/30/22   Haydee Salter, MD  RABEprazole (ACIPHEX) 20 MG tablet Take 20 mg by mouth 2 (two) times daily. 06/07/22   [provider]  sucralfate (CARAFATE) 1 g tablet Take 1 g by mouth 4 (four) times daily. 05/30/22   [provider]  tiZANidine (ZANAFLEX) 4 MG tablet Take 1 tablet (4 mg total) by mouth every 8 (eight) hours as needed for muscle spasms. 07/02/22   Suzzanne Cloud, NP  valsartan (DIOVAN) 80 MG tablet Take 1 tablet (80 mg total) by mouth daily. 06/16/22   Burnell Blanks, MD  Vitamin E (VITAMIN E/D-ALPHA NATURAL) 268 MG (400 UNIT) CAPS Take by mouth.    [provider]      Allergies    Sulfamethoxazole, Amitriptyline, Cymbalta [duloxetine hcl], Lactose intolerance (gi), Lexapro [escitalopram oxalate], Topamax [topiramate], and Prednisone    Review of Systems   Review of Systems  Physical Exam Updated Vital Signs BP 120/62   Pulse 66   Temp 97.8 F (36.6 C)   Resp 16   Ht '5\' 6"'$  (1.676 m)   Wt 65.8 kg   SpO2 100%   BMI 23.40 kg/m  Physical Exam Constitutional:      Comments: Alert nontoxic no acute distress  HENT:     Mouth/Throat:     Pharynx: Oropharynx is clear.  Eyes:     Extraocular Movements: Extraocular movements intact.  Cardiovascular:     Rate and Rhythm: Normal rate and regular rhythm.  Pulmonary:     Effort: Pulmonary effort is normal.     Breath sounds: Normal breath sounds.  Abdominal:     Comments: Abdomen soft but very tender in the epigastrium.  Lower abdomen nontender.  Musculoskeletal:        General: No swelling or tenderness. Normal range of motion.     Right lower leg: No edema.     Left lower leg: No edema.  Skin:    General: Skin is warm and dry.  Neurological:     General: No focal deficit present.     Mental Status: She is oriented to person, place, and time.     Motor: No weakness.     Coordination:  Coordination normal.  Psychiatric:        Mood and Affect: Mood normal.     ED Results / Procedures / Treatments   Labs (all labs ordered are listed, but only abnormal results are displayed) Labs Reviewed  COMPREHENSIVE METABOLIC PANEL - Abnormal; Notable for the following components:      Result Value   Sodium 134 (*)    Glucose, Bld 107 (*)    BUN 6 (*)    All other components within normal limits  URINALYSIS, ROUTINE  W REFLEX MICROSCOPIC - Abnormal; Notable for the following components:   Leukocytes,Ua MODERATE (*)    All other components within normal limits  URINALYSIS, MICROSCOPIC (REFLEX) - Abnormal; Notable for the following components:   Bacteria, UA RARE (*)    All other components within normal limits  LIPASE, BLOOD  CBC    EKG None  Radiology CT ABDOMEN PELVIS WO CONTRAST  Result Date: 08/07/2022 CLINICAL DATA:  Abdominal pain EXAM: CT ABDOMEN AND PELVIS WITHOUT CONTRAST TECHNIQUE: Multidetector CT imaging of the abdomen and pelvis was performed following the standard protocol without IV contrast. RADIATION DOSE REDUCTION: This exam was performed according to the departmental dose-optimization program which includes automated exposure control, adjustment of the mA and/or kV according to patient size and/or use of iterative reconstruction technique. COMPARISON:  CT pelvis 11/21/2020.  CT abdomen and pelvis 11/21/2020 FINDINGS: Lower chest: No acute abnormality. Hepatobiliary: There is a 17 mm cyst in the left lobe of the liver. The liver is otherwise within normal limits. No gallstones, gallbladder wall thickening, or biliary dilatation. Pancreas: Unremarkable. No pancreatic ductal dilatation or surrounding inflammatory changes. Spleen: Normal in size without focal abnormality. Adrenals/Urinary Tract: There is an indeterminate left adrenal nodule measuring 1 cm. Right adrenal gland is within normal limits. Kidneys and bladder are within normal limits. Stomach/Bowel:  Stomach is within normal limits. Appendix is not seen there is sigmoid colon diverticulosis. No evidence of bowel wall thickening, distention, or inflammatory changes. Vascular/Lymphatic: Aortic atherosclerosis. No enlarged abdominal or pelvic lymph nodes. Reproductive: Status post hysterectomy. No adnexal masses. Other: There is a small fat containing umbilical hernia. No free fluid. Musculoskeletal: No acute or significant osseous findings. IMPRESSION: 1. No acute localizing process in the abdomen or pelvis. 2. Sigmoid colon diverticulosis. 3. Indeterminate left adrenal nodule measuring 1 cm. Recommend further evaluation with adrenal CT or MRI. Aortic Atherosclerosis (ICD10-I70.0). Electronically Signed   By: Ronney Asters M.D.   On: 08/07/2022 17:26    Procedures Procedures    Medications Ordered in ED Medications  sucralfate (CARAFATE) 1 GM/10ML suspension 1 g (has no administration in time range)  alum & mag hydroxide-simeth (MAALOX/MYLANTA) 200-200-20 MG/5ML suspension 30 mL (30 mLs Oral Given 08/07/22 1830)    ED Course/ Medical Decision Making/ A&P                           Medical Decision Making Amount and/or Complexity of Data Reviewed Labs: ordered. Radiology: ordered.  Risk OTC drugs. Prescription drug management.   Patient presents with upper abdominal pain.  She has had some problems with recurrent abdominal pain and history of esophageal dilation.  Also recent upper endoscopy 12\7 with biopsies.  However, patient notes severe pain that started after added contrast IV on 1215.  Clinically patient is alert and nontoxic.  Due to severity of pain and change in quality we will proceed with diagnostic evaluation.  Will obtain lab work as well as CT abdomen pelvis.  At this time we will do noncontrast CT.  Labs stable.  No acute changes creatinine are within normal limits GFR greater than 60.  Lipase normal white count normal.  CT scan reviewed by radiology no acute  findings.  At this time with no acute findings on labs or CT and patient clinically well in appearance, will proceed with symptomatic treatment.  Patient had pre-existing somewhat chronic and recurrent abdominal pain.  He has been on multiple therapies for similar symptoms.  With recent upper  endoscopy and GERD risk, will start Carafate and administer GI cocktail.  Have patient continue at home with maximal PPI therapy, oral Carafate and close management with gastroenterology.  She has normal vital signs, stable diagnostic evaluation at this time I feel appropriate for continued home management.        Final Clinical Impression(s) / ED Diagnoses Final diagnoses:  Epigastric pain    Rx / DC Orders ED Discharge Orders          Ordered    sucralfate (CARAFATE) 1 GM/10ML suspension  3 times daily with meals & bedtime        08/07/22 1815              Charlesetta Shanks, MD 08/07/22 1813    Charlesetta Shanks, MD 08/07/22 1858

## 2022-08-07 NOTE — Telephone Encounter (Signed)
Pt stated the depression medication you sent to the pharmacy is to expensive can you prescribe the patient a different medication

## 2022-08-07 NOTE — Telephone Encounter (Signed)
Called patient to advise of providers recommendations.  She states to forget that she called, doesn't want a referral to psychiatry and can't help it that her insurance won't cover medications and she is on a limited budget.  Dm/cma

## 2022-08-07 NOTE — ED Triage Notes (Signed)
Pt had endoscopy on 12/7 and had several biopsies then had a ct scan with contrast on 12/15. Pt states abd pain has been severe abd pain from middle to left side pain every since the imaging. Pt feels nauseated with no vomiting. Diarrhea 2 days ago.

## 2022-08-12 ENCOUNTER — Other Ambulatory Visit: Payer: Self-pay | Admitting: Family Medicine

## 2022-08-12 DIAGNOSIS — F341 Dysthymic disorder: Secondary | ICD-10-CM

## 2022-08-13 ENCOUNTER — Telehealth: Payer: Medicare HMO | Admitting: Physician Assistant

## 2022-08-13 DIAGNOSIS — R3989 Other symptoms and signs involving the genitourinary system: Secondary | ICD-10-CM | POA: Diagnosis not present

## 2022-08-13 MED ORDER — CEPHALEXIN 500 MG PO CAPS: 500.0000 mg | ORAL_CAPSULE | Freq: Two times a day (BID) | ORAL | 0 refills | Status: AC

## 2022-08-13 NOTE — Progress Notes (Signed)
I have spent 5 minutes in review of e-visit questionnaire, review and updating patient chart, medical decision making and response to patient.   Bartt Gonzaga Cody [place] Whedbee, PA-C    

## 2022-08-13 NOTE — Progress Notes (Signed)

## 2022-09-02 ENCOUNTER — Telehealth: Payer: Medicare HMO | Admitting: Internal Medicine

## 2022-09-11 ENCOUNTER — Ambulatory Visit: Payer: Medicare HMO | Attending: Cardiovascular Disease

## 2022-09-11 DIAGNOSIS — I10 Essential (primary) hypertension: Secondary | ICD-10-CM

## 2022-09-11 DIAGNOSIS — I071 Rheumatic tricuspid insufficiency: Secondary | ICD-10-CM

## 2022-09-11 DIAGNOSIS — E782 Mixed hyperlipidemia: Secondary | ICD-10-CM

## 2022-09-12 LAB — HEPATIC FUNCTION PANEL
ALT: 12 IU/L (ref 0–32)
AST: 14 IU/L (ref 0–40)
Albumin: 4.3 g/dL (ref 3.8–4.8)
Alkaline Phosphatase: 123 IU/L — ABNORMAL HIGH (ref 44–121)
Bilirubin Total: 0.3 mg/dL (ref 0.0–1.2)
Bilirubin, Direct: <0.1 mg/dL (ref 0.00–0.40)
Total Protein: 6.6 g/dL (ref 6.0–8.5)

## 2022-09-12 LAB — LIPID PANEL
Chol/HDL Ratio: 2.1 ratio (ref 0.0–4.4)
Cholesterol, Total: 105 mg/dL (ref 100–199)
HDL: 51 mg/dL (ref >39)
LDL Chol Calc (NIH): 32 mg/dL (ref 0–99)
Triglycerides: 125 mg/dL (ref 0–149)
VLDL Cholesterol Cal: 22 mg/dL (ref 5–40)

## 2022-09-12 LAB — APOLIPOPROTEIN B: Apolipoprotein B: 45 mg/dL (ref <90)

## 2022-09-15 ENCOUNTER — Ambulatory Visit: Payer: Medicare HMO | Attending: Cardiology | Admitting: Pharmacist

## 2022-09-15 ENCOUNTER — Encounter: Payer: Self-pay | Admitting: Pharmacist

## 2022-09-15 VITALS — BP 142/62

## 2022-09-15 DIAGNOSIS — E782 Mixed hyperlipidemia: Secondary | ICD-10-CM

## 2022-09-15 DIAGNOSIS — I10 Essential (primary) hypertension: Secondary | ICD-10-CM

## 2022-09-15 NOTE — Progress Notes (Unsigned)
Patient ID: [name]                 DOB: Sep 08, 1950                    MRN: [id number]     HPI: Megan [name] is a 72 y.o. female patient referred to lipid clinic by Dr. Burt Knack. PMH is significant for adrenal adenoma, asthma, chest pain, depression, hyperlipidemia, IBS, tricuspid regurgitation, ASCVD, and hypothyroidism. Coronary CT 12/24/21 showed calcium score of 115, placing pt in the 72nd percentile.   Patient seen in PharmD clinic 06/16/22. She was started on Repatha. Repeat LDL-C 32. Blood pressure was elevated in clinic. Valsartan was increased to '80mg'$  daily. Follow up labs stable.  Patient presents today to clinic. She brings in her home blood pressure cuff. History of medications is somewhat confusing. Patient states she was started on HCTZ in the summer. Only took it for a few weeks. Then forgot about it for a little while and then started back on it. But reports that she hasn't taken in awhile. She also states she started taking valsartan '80mg'$  but experienced nausea and some lightheadedness so she decreased to 1/2 tablet ('40mg'$ ) and has been only taking '40mg'$  daily.  She has been having diarrhea. Has a long hx of GI issues. Thinks it started around the time she started Repatha. GI Dr. Prescribed her Orpah Cobb. Gets a little flushed sometimes when she walks the dog. She is now taking metoprolol succinate '50mg'$  daily instead of '25mg'$  BID.   142/62 clinic cuff 147/76 home cuff  Current Lipid Medications: Repatha '140mg'$  q 14 days Current HTN Medications: HCTZ 25 mg daily (has not been taking), Metoprolol ER 50 mg daily, Cardizem CD 180 mg daily and Valsartan 80 mg daily (only taking '40mg'$ ) Intolerances: atorvastatin 10-80 mg daily (headache, joint pain, muscle pain, GI symptoms 2/2 UC), rosuvastatin 10 mg daily (headache, joint pain, muscle pain, GI symptoms 2/2 UC), ezetimibe 10 mg daily (unknown) Risk Factors: CAC score in 72nd percentile LDL goal: <70 mg/dL given coronary CT  results  Diet: not discussed due to time constraints  Exercise: walks the dog daily  133/75,141/76, 136/88, 136/75, 142/75, 126/69, 136/73, 136/78  Family History:  Family History  Problem Relation Age of Onset   Colon cancer Mother    Diabetes Mother    Hypertension Mother    Colon polyps Father    Parkinson's disease Father    Colon cancer Maternal Grandmother    Breast cancer Maternal Grandmother 65   Diabetes Paternal Grandmother    Breast cancer Paternal Grandmother 69   Allergies Sister    Arthritis Brother        b/l hip replace   Alcohol abuse Daughter    Arthritis Son    Hypertension Son    Allergies Son    Osteoporosis Son    Osteoporosis Son    Arthritis Son        back disease, DDD   Allergies Son    Arthritis Son    Irritable bowel syndrome Son        RSD   Hypertension Paternal Grandfather    Heart disease Paternal Grandfather    Aneurysm Paternal Grandfather    Colon cancer Maternal Aunt    Colon cancer Cousin        X3   Ovarian cancer Cousin     Social History:  Social History   Socioeconomic History   Marital status: Married  Spouse name: Not on file   Number of children: 4   Years of education: 75   Highest education level: High school graduate  Occupational History   Occupation: Retired  Tobacco Use   Smoking status: Former    Types: Cigarettes    Quit date: 08/19/1995    Years since quitting: 27.0   Smokeless tobacco: Never  Vaping Use   Vaping Use: Never used  Substance and Sexual Activity   Alcohol use: Never    Alcohol/week: 0.0 standard drinks of alcohol   Drug use: Never   Sexual activity: Not Currently    Birth control/protection: Post-menopausal    Comment: 1st intercourse 37 yo-5 partners  Other Topics Concern   Not on file  Social History Narrative   Lives at home with her husband.   Right-handed.   2 cups caffeine per day.   Social Determinants of Health   Financial Resource Strain: Not on file  Food  Insecurity: Not on file  Transportation Needs: Not on file  Physical Activity: Not on file  Stress: Not on file  Social Connections: Not on file  Intimate Partner Violence: Not on file    Labs:  Lipid Panel while taking atorvastatin 40 mg daily    Component Value Date/Time   CHOL 105 09/11/2022 0815   TRIG 125 09/11/2022 0815   HDL 51 09/11/2022 0815   CHOLHDL 2.1 09/11/2022 0815   CHOLHDL 3 07/29/2021 0803   VLDL 17.4 07/29/2021 0803   LDLCALC 32 09/11/2022 0815   LDLDIRECT 165.0 06/30/2011 1153   LABVLDL 22 09/11/2022 0815    LDL 154 on 07/29/2021, and LDL 167 on 09/11/2014.  Pt in unsure of the cholesterol regimen for either LDL.   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 difficile diarrhea    Cancer ([place])    pt denies    Chest pain, atypical 07/14/2011   Cold sore 07/17/2016   Colon polyp    Contact dermatitis 03/23/2012   Cystocele, midline    Depression    Diarrhea    Diverticulosis    Endometrial hyperplasia 02/27/2006   BENIGN ENDO BX ON 02/2007   GERD (gastroesophageal reflux disease)    Headache 04/16/2015   Headache    Headache(784.0) 04/15/2012   pt denies    Heart murmur    Hematuria 04/27/2012   Hepatic cyst    Hiatal hernia    Hiatal hernia    Hyperlipidemia, mixed 12/17/2014   Hypertension    IBS (irritable bowel syndrome)    Insomnia    Leaky heart valve    Low back pain 08/15/2013   Osteoporosis 03/2017   T score -2.5. 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   Sinusitis acute 02/03/2012   Thrush 07/21/2011   Tricuspid regurgitation, Mild-Moderate  07/21/2011   Moderate by 2 d echocardiogram Echo 11/2021: EF 60-65, no RWMA, normal RVSF, moderately elevated PASP, RVSP 52.1, trivial MR, mild to moderate TR   Unspecified hypothyroidism    Unspecified menopausal and postmenopausal disorder    Uterine prolapse without  mention of vaginal wall prolapse    Vitamin B12 deficiency    Vitamin D deficiency 07/17/2016    Current Outpatient Medications on File Prior to Visit  Medication Sig Dispense Refill   albuterol (VENTOLIN HFA) 108 (90 Base) MCG/ACT inhaler TAKE 2 PUFFS BY MOUTH EVERY 6 HOURS AS NEEDED FOR WHEEZE OR SHORTNESS OF BREATH 8.5 each 1   ALPRAZolam (XANAX) 1 MG tablet TAKE 1 TABLET BY MOUTH TWICE A DAY AS NEEDED 30 tablet 1   Alum & Mag Hydroxide-Simeth (MYLANTA PO) Take by mouth. PRN     Alum Hydroxide-Mag Carbonate (GAVISCON PO) Take by mouth. PRN     aspirin EC 81 MG tablet Take 81 mg by mouth daily. Swallow whole.     Biotin 10 MG TABS Take by mouth.     butalbital-acetaminophen-caffeine (FIORICET) 50-325-40 MG tablet TAKE 1 TABLET BY MOUTH EVERY 6 HOURS AS NEEDED FOR HEADACHE. (Patient not taking: Reported on 07/30/2022) 10 tablet 3   cholecalciferol (VITAMIN D3) 25 MCG (1000 UNIT) tablet Take 5,000 Units by mouth daily.     cyclobenzaprine (FLEXERIL) 5 MG tablet Take 1 tablet (5 mg total) by mouth 3 (three) times daily as needed for muscle spasms. 60 tablet 1   dicyclomine (BENTYL) 10 MG capsule      diltiazem (CARDIZEM CD) 180 MG 24 hr capsule TAKE 1 CAPSULE BY MOUTH EVERY DAY 90 capsule 3   estradiol (ESTRACE) 0.1 MG/GM vaginal cream Place 0.5 g vaginally 2 (two) times a week. Place 0.5g nightly for two weeks then twice a week after 30 g 11   Evolocumab (REPATHA SURECLICK) 831 MG/ML SOAJ Inject 140 mg into the skin every 14 (fourteen) days. 6 mL 3   famotidine-calcium carbonate-magnesium hydroxide (PEPCID COMPLETE) 10-800-165 MG chewable tablet Chew 1 tablet by mouth daily as needed.     fluticasone (FLONASE) 50 MCG/ACT nasal  spray SPRAY 2 SPRAYS INTO EACH NOSTRIL EVERY DAY 48 mL 6   gabapentin (NEURONTIN) 300 MG capsule Take 1 capsule twice daily 60 capsule 5   hydrochlorothiazide (HYDRODIURIL) 25 MG tablet Take 1 tablet (25 mg total) by mouth daily. 90 tablet 3   LINZESS 290 MCG CAPS capsule Take 290 mcg by mouth daily.     metoprolol succinate (TOPROL-XL) 25 MG 24 hr tablet Take 1 tablet (25 mg total) by mouth in the morning and at bedtime. 180 tablet 3   Multiple Vitamin (MULTIVITAMIN) tablet Take 1 tablet by mouth daily.     ondansetron (ZOFRAN) 4 MG tablet TAKE 1 TABLET BY MOUTH EVERY 8 HOURS AS NEEDED FOR NAUSEA AND VOMITING 20 tablet 3   polyethylene glycol (MIRALAX / GLYCOLAX) 17 g packet Take 17 g by mouth daily. 14 each 0   promethazine (PHENERGAN) 25 MG tablet Take 1 tablet (25 mg total) by mouth every 6 (six) hours as needed for nausea or vomiting. 30 tablet 2   RABEprazole (ACIPHEX) 20 MG tablet Take 20 mg by mouth 2 (two) times daily.     sucralfate (CARAFATE) 1 g tablet Take 1 g by mouth 4 (four) times daily.     sucralfate (CARAFATE) 1 GM/10ML suspension Take 10 mLs (1 g total) by mouth 4 (four) times daily -  with meals and at bedtime. 420 mL 1   tiZANidine (ZANAFLEX) 4 MG tablet Take 1 tablet (4 mg total) by mouth every 8 (eight) hours as needed for muscle spasms. 30 tablet 2   valsartan (DIOVAN) 80 MG tablet Take 1 tablet (80 mg total)  by mouth daily. 90 tablet 1   Vitamin E (VITAMIN E/D-ALPHA NATURAL) 268 MG (400 UNIT) CAPS Take by mouth.     No current facility-administered medications on file prior to visit.    Allergies  Allergen Reactions   Sulfamethoxazole Shortness Of Breath    chest tightness   Amitriptyline Anxiety and Other (See Comments)    Depression, insomnia   Cymbalta [Duloxetine Hcl] Other (See Comments)    Insomnia, constipation   Lactose Intolerance (Gi) Diarrhea and Nausea And Vomiting   Lexapro [Escitalopram Oxalate]     nausea   Topamax [Topiramate] Itching    Prednisone Palpitations    Assessment/Plan:  1. Hyperlipidemia - Most recent LDL of 32 on Repatha. Advised that diarrhea is a rare side effect of Repatha, but if she wanted to skip a dose and see how she felt that would be ok. Patient to let us know  2. Hypertension - BP of 142/62 with similar readings at home. Home cuff was similar for the systolic reading, but was off diastolic. I have asked her to try taking valsartan '40mg'$  BID. Pt is agreeable. Can continue to hold HCTZ. Continue diltiazem '120mg'$  daily and metoprolol succinate '50mg'$  daily. Follow up in 1 month.  Thank you for allowing pharmacy to participate in this patient's care.  Ramond Dial, Pharm.D, BCPS, CPP [facility] [address] [place], [place] [address]  Phone: [phone]; Fax: [phone]   09/15/2022 7:59 AM

## 2022-09-15 NOTE — Patient Instructions (Addendum)
  Summary of today's discussion  1.Start taking valsartan 1/2 tablet ('40mg'$ ) in the AM and '40mg'$  in the PM  2.Continue metoprolol ER '50mg'$  daily and Cardizem CD 180 mg daily  3. Continue checking blood pressure  4.  5.   Your blood pressure goal is <130/80  To check your pressure at home you will need to:  1. Sit up in a chair, with feet flat on the floor and back supported. Do not cross your ankles or legs. 2. Rest your left arm so that the cuff is about heart level. If the cuff goes on your upper arm,  then just relax the arm on the table, arm of the chair or your lap. If you have a wrist cuff, we  suggest relaxing your wrist against your chest (think of it as Pledging the Flag with the  wrong arm).  3. Place the cuff snugly around your arm, about 1 inch above the crook of your elbow. The  cords should be inside the groove of your elbow.  4. Sit quietly, with the cuff in place, for about 5 minutes. After that 5 minutes press the power  button to start a reading. 5. Do not talk or move while the reading is taking place.  6. Record your readings on a sheet of paper. Although most cuffs have a memory, it is often  easier to see a pattern developing when the numbers are all in front of you.  7. You can repeat the reading after 1-3 minutes if it is recommended  Make sure your bladder is empty and you have not had caffeine or tobacco within the last 30 min  Always bring your blood pressure log with you to your appointments. If you have not brought your monitor in to be double checked for accuracy, please bring it to your next appointment.  You can find a list of validated (accurate) blood pressure cuffs at [employer]   Important lifestyle changes to control high blood pressure  Intervention  Effect on the BP  Lose extra pounds and watch your waistline Weight loss is one of the most effective lifestyle changes for controlling blood pressure. If you're overweight or obese, losing even  a small amount of weight can help reduce blood pressure. Blood pressure might go down by about 1 millimeter of mercury (mm Hg) with each kilogram (about 2.2 pounds) of weight lost.  Exercise regularly As a general goal, aim for at least 30 minutes of moderate physical activity every day. Regular physical activity can lower high blood pressure by about 5 to 8 mm Hg.  Eat a healthy diet Eating a diet rich in whole grains, fruits, vegetables, and low-fat dairy products and low in saturated fat and cholesterol. A healthy diet can lower high blood pressure by up to 11 mm Hg.  Reduce salt (sodium) in your diet Even a small reduction of sodium in the diet can improve heart health and reduce high blood pressure by about 5 to 6 mm Hg.  Limit alcohol One drink equals 12 ounces of beer, 5 ounces of wine, or 1.5 ounces of 80-proof liquor.  Limiting alcohol to less than one drink a day for women or two drinks a day for men can help lower blood pressure by about 4 mm Hg.   Please call me at [phone] with any questions.

## 2022-09-18 ENCOUNTER — Other Ambulatory Visit: Payer: Self-pay

## 2022-09-18 DIAGNOSIS — I10 Essential (primary) hypertension: Secondary | ICD-10-CM

## 2022-09-18 DIAGNOSIS — E782 Mixed hyperlipidemia: Secondary | ICD-10-CM

## 2022-09-18 DIAGNOSIS — G3184 Mild cognitive impairment, so stated: Secondary | ICD-10-CM

## 2022-09-19 NOTE — Addendum Note (Signed)
Addended by: Konrad Saha on: 09/19/2022 02:18 PM   Modules accepted: Orders

## 2022-09-26 ENCOUNTER — Telehealth: Payer: Medicare HMO | Admitting: Nurse Practitioner

## 2022-09-26 DIAGNOSIS — J020 Streptococcal pharyngitis: Secondary | ICD-10-CM | POA: Diagnosis not present

## 2022-09-26 MED ORDER — AMOXICILLIN 500 MG PO CAPS: 500.0000 mg | ORAL_CAPSULE | Freq: Two times a day (BID) | ORAL | 0 refills | Status: AC

## 2022-09-26 NOTE — Progress Notes (Signed)
E-Visit for Sore Throat - Strep Symptoms  We are sorry that you are not feeling well.  Here is how we plan to help!  Based on what you have shared with me it is likely that you have strep pharyngitis.  Strep pharyngitis is inflammation and infection in the back of the throat.  This is an infection cause by bacteria and is treated with antibiotics.  I have prescribed Amoxicillin 500 mg twice a day for 10 days. For throat pain, we recommend over the counter oral pain relief medications such as acetaminophen or aspirin, or anti-inflammatory medications such as ibuprofen or naproxen sodium. Topical treatments such as oral throat lozenges or sprays may be used as needed. Strep infections are not as easily transmitted as other respiratory infections, however we still recommend that you avoid close contact with loved ones, especially the very young and elderly.  Remember to wash your hands thoroughly throughout the day as this is the number one way to prevent the spread of infection and wipe down door knobs and counters with disinfectant.   Home Care: Only take medications as instructed by your medical team. Complete the entire course of an antibiotic. Do not take these medications with alcohol. A steam or ultrasonic humidifier can help congestion.  You can place a towel over your head and breathe in the steam from hot water coming from a faucet. Avoid close contacts especially the very young and the elderly. Cover your mouth when you cough or sneeze. Always remember to wash your hands.  Get Help Right Away If: You develop worsening fever or sinus pain. You develop a severe head ache or visual changes. Your symptoms persist after you have completed your treatment plan.  Make sure you Understand these instructions. Will watch your condition. Will get help right away if you are not doing well or get worse.   Thank you for choosing an e-visit.  Your e-visit answers were reviewed by a board  certified advanced clinical practitioner to complete your personal care plan. Depending upon the condition, your plan could have included both over the counter or prescription medications.  Please review your pharmacy choice. Make sure the pharmacy is open so you can pick up prescription now. If there is a problem, you may contact your provider through [employer] and have the prescription routed to another pharmacy.  Your safety is important to [place]. If you have drug allergies check your prescription carefully.   For the next 24 hours you can use MyChart to ask questions about today's visit, request a non-urgent call back, or ask for a work or school excuse. You will get an email in the next two days asking about your experience. I hope that your e-visit has been valuable and will speed your recovery.   Meds ordered this encounter  Medications   amoxicillin (AMOXIL) 500 MG capsule    Sig: Take 1 capsule (500 mg total) by mouth 2 (two) times daily for 10 days.    Dispense:  20 capsule    Refill:  0    I spent approximately 5 minutes reviewing the patient's history, current symptoms and coordinating their care today.

## 2022-09-30 ENCOUNTER — Other Ambulatory Visit: Payer: Self-pay | Admitting: Family Medicine

## 2022-09-30 DIAGNOSIS — F341 Dysthymic disorder: Secondary | ICD-10-CM

## 2022-10-09 ENCOUNTER — Other Ambulatory Visit: Payer: Self-pay | Admitting: Family Medicine

## 2022-10-09 DIAGNOSIS — Z1231 Encounter for screening mammogram for malignant neoplasm of breast: Secondary | ICD-10-CM

## 2022-10-20 ENCOUNTER — Telehealth: Payer: Medicare HMO | Admitting: Physician Assistant

## 2022-10-20 DIAGNOSIS — K047 Periapical abscess without sinus: Secondary | ICD-10-CM

## 2022-10-20 MED ORDER — PENICILLIN V POTASSIUM 500 MG PO TABS: 500.0000 mg | ORAL_TABLET | Freq: Three times a day (TID) | ORAL | 0 refills | Status: AC

## 2022-10-20 MED ORDER — IBUPROFEN 600 MG PO TABS: 600.0000 mg | ORAL_TABLET | Freq: Three times a day (TID) | ORAL | 0 refills | Status: DC | PRN

## 2022-10-20 NOTE — Progress Notes (Signed)

## 2022-10-22 NOTE — Progress Notes (Signed)
Patient ID: Megan [name]                 DOB: 01-Feb-1951                      MRN: [id number]      HPI: Megan [name] is a 72 y.o. female referred by Dr. Burt Knack to HTN clinic. PMH is significant for adrenal adenoma, asthma, chest pain, depression, hyperlipidemia, IBS, tricuspid regurgitation, ASCVD, and hypothyroidism. Coronary CT 12/24/21 showed calcium score of 115, placing pt in the 72nd percentile.   Patient seen in PharmD clinic 06/16/22. She was started on Repatha. Blood pressure was elevated in clinic. Valsartan was increased to '80mg'$  daily. Follow up labs stable.   In visit on 09/09/2022 patient brought in her home blood pressure cuff. History of medications was somewhat confusing. Patient stated she was off and on HCTZ since the summer and was not currently taking it. She also stated she started taking valsartan '80mg'$  but experienced nausea and some lightheadedness so she decreased to 1/2 tablet ('40mg'$ ) and had been only taking '40mg'$  daily. She was also taking metoprolol succinate '50mg'$  daily instead of '25mg'$  BID. Valsartan was increased to '40mg'$  BID, HCTZ held, and metoprolol continued at '50mg'$  daily.   In today's visit, patient states she currently takes Cardizem 240 mg, metoprolol ER '50mg'$  and not valsartan. Stopped taking valsartan on 10/19/22. Reports BP is fluctuating between high and low. Sometimes she gets hot flashes accompanied by nausea. Reports one episode of lower BP reading during the hot flash- 112/65. Other times, it's higher than normal- 160s. Denies dizziness, lightheadedness, chest pain, SOB, just feeling hot and unable to get cool. Home BP ranges are variable. Readings from after increasing Cardizem range from upper [id number] systolic. Patient reports concerns with her left side: hearing loss/aches, swollen glands, head pain, shoulder pain down to the elbow accompanied by nausea - these symptoms are progressively getting worse. Denies numbness, pain does not radiate down the  arm. Contacted the dentist about left tooth pain and swollen gums, prescribed Pen VK x7 days, patient reported not finishing the course due to not feeling relief. Reports some GI issues that are getting worse. Primary care appointment scheduled for June. Neurologist appointment next month.    Current HTN meds: Metoprolol ER 50 mg daily, Cardizem CD 240 mg daily (patient increased on her own) and Valsartan 40 mg BID (patient reports not taking) Previously tried:  BP goal: < 130/80  Family History:       Family History  Problem Relation Age of Onset   Colon cancer Mother     Diabetes Mother     Hypertension Mother     Colon polyps Father     Parkinson's disease Father     Colon cancer Maternal Grandmother     Breast cancer Maternal Grandmother 80   Diabetes Paternal Grandmother     Breast cancer Paternal Grandmother 61   Allergies Sister     Arthritis Brother          b/l hip replace   Alcohol abuse Daughter     Arthritis Son     Hypertension Son     Allergies Son     Osteoporosis Son     Osteoporosis Son     Arthritis Son          back disease, DDD   Allergies Son     Arthritis Son     Irritable bowel syndrome Son  RSD   Hypertension Paternal Grandfather     Heart disease Paternal Grandfather     Aneurysm Paternal Grandfather     Colon cancer Maternal Aunt     Colon cancer Cousin          X3   Ovarian cancer Cousin    Social History: Former smoker, quit 08/19/1995. No smokeless tobacco, no alcohol use, no drug use.   Diet: not talked about due to time   Exercise: walks the dog every other day, but limited due to weather recently   Home BP readings: upper 120s-140s as of 10/19/22   Wt Readings from Last 3 Encounters:  08/07/22 145 lb (65.8 kg)  07/30/22 141 lb 3.2 oz (64 kg)  07/02/22 143 lb (64.9 kg)   BP Readings from Last 3 Encounters:  10/23/22 (!) 152/68  09/15/22 (!) 142/62  08/07/22 138/67   Pulse Readings from Last 3 Encounters:  10/23/22 66   08/07/22 68  07/30/22 92    Renal function: CrCl cannot be calculated (Patient's most recent lab result is older than the maximum 21 days allowed.).  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 difficile diarrhea    Cancer ([place])    pt denies    Chest pain, atypical 07/14/2011   Cold sore 07/17/2016   Colon polyp    Contact dermatitis 03/23/2012   Cystocele, midline    Depression    Diarrhea    Diverticulosis    Endometrial hyperplasia 02/27/2006   BENIGN ENDO BX ON 02/2007   GERD (gastroesophageal reflux disease)    Headache 04/16/2015   Headache    Headache(784.0) 04/15/2012   pt denies    Heart murmur    Hematuria 04/27/2012   Hepatic cyst    Hiatal hernia    Hiatal hernia    Hyperlipidemia, mixed 12/17/2014   Hypertension    IBS (irritable bowel syndrome)    Insomnia    Leaky heart valve    Low back pain 08/15/2013   Osteoporosis 03/2017   T score -2.5. 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   Sinusitis acute 02/03/2012   Thrush 07/21/2011   Tricuspid regurgitation, Mild-Moderate 07/21/2011   Moderate by 2 d echocardiogram Echo 11/2021: EF 60-65, no RWMA, normal RVSF, moderately elevated PASP, RVSP 52.1, trivial MR, mild to moderate TR   Unspecified hypothyroidism    Unspecified menopausal and postmenopausal disorder    Uterine prolapse without mention of vaginal wall prolapse    Vitamin B12 deficiency    Vitamin D deficiency 07/17/2016    Current Outpatient Medications on File Prior to Visit  Medication Sig Dispense Refill   albuterol (VENTOLIN HFA) 108 (90 Base) MCG/ACT  inhaler TAKE 2 PUFFS BY MOUTH EVERY 6 HOURS AS NEEDED FOR WHEEZE OR SHORTNESS OF BREATH 8.5 each 1   ALPRAZolam (XANAX) 1 MG tablet TAKE 1 TABLET BY MOUTH TWICE A DAY AS NEEDED 30 tablet 1   Alum & Mag Hydroxide-Simeth (MYLANTA PO) Take by mouth. PRN     Alum Hydroxide-Mag Carbonate (GAVISCON PO) Take by  mouth. PRN     aspirin EC 81 MG tablet Take 81 mg by mouth daily. Swallow whole.     Biotin 10 MG TABS Take by mouth.     butalbital-acetaminophen-caffeine (FIORICET) 50-325-40 MG tablet TAKE 1 TABLET BY MOUTH EVERY 6 HOURS AS NEEDED FOR HEADACHE. (Patient not taking: Reported on 07/30/2022) 10 tablet 3   cholecalciferol (VITAMIN D3) 25 MCG (1000 UNIT) tablet Take 5,000 Units by mouth daily.     cyclobenzaprine (FLEXERIL) 5 MG tablet Take 1 tablet (5 mg total) by mouth 3 (three) times daily as needed for muscle spasms. 60 tablet 1   dicyclomine (BENTYL) 10 MG capsule  (Patient not taking: Reported on 09/15/2022)     estradiol (ESTRACE) 0.1 MG/GM vaginal cream Place 0.5 g vaginally 2 (two) times a week. Place 0.5g nightly for two weeks then twice a week after 30 g 11   Evolocumab (REPATHA SURECLICK) [id number] MG/ML SOAJ Inject 140 mg into the skin every 14 (fourteen) days. 6 mL 3   famotidine-calcium carbonate-magnesium hydroxide (PEPCID COMPLETE) 10-800-165 MG chewable tablet Chew 1 tablet by mouth daily as needed.     fluticasone (FLONASE) 50 MCG/ACT nasal spray SPRAY 2 SPRAYS INTO EACH NOSTRIL EVERY DAY 48 mL 6   gabapentin (NEURONTIN) 300 MG capsule Take 1 capsule twice daily 60 capsule 5   IBSRELA 50 MG TABS Take 1 tablet by mouth 2 (two) times daily.     ibuprofen (ADVIL) 600 MG tablet Take 1 tablet (600 mg total) by mouth every 8 (eight) hours as needed. 30 tablet 0   Multiple Vitamin (MULTIVITAMIN) tablet Take 1 tablet by mouth daily.     ondansetron (ZOFRAN) 4 MG tablet TAKE 1 TABLET BY MOUTH EVERY 8 HOURS AS NEEDED FOR NAUSEA AND VOMITING 20 tablet 3   penicillin v potassium (VEETID) 500 MG  tablet Take 1 tablet (500 mg total) by mouth 3 (three) times daily for 7 days. 21 tablet 0   promethazine (PHENERGAN) 25 MG tablet Take 1 tablet (25 mg total) by mouth every 6 (six) hours as needed for nausea or vomiting. 30 tablet 2   RABEprazole (ACIPHEX) 20 MG tablet Take 20 mg by mouth 2 (two) times daily.     sucralfate (CARAFATE) 1 GM/10ML suspension Take 10 mLs (1 g total) by mouth 4 (four) times daily -  with meals and at bedtime. 420 mL 1   tiZANidine (ZANAFLEX) 4 MG tablet Take 1 tablet (4 mg total) by mouth every 8 (eight) hours as needed for muscle spasms. 30 tablet 2   valsartan (DIOVAN) 80 MG tablet Take 1 tablet (80 mg total) by mouth daily. 90 tablet 1   Vitamin E (VITAMIN E/D-ALPHA NATURAL) 268 MG (400 UNIT) CAPS Take by mouth.     No current facility-administered medications on file prior to visit.    Allergies  Allergen Reactions   Sulfamethoxazole Shortness Of Breath    chest tightness   Amitriptyline Anxiety and Other (See Comments)    Depression, insomnia   Cymbalta [Duloxetine Hcl] Other (See Comments)    Insomnia, constipation   Lactose Intolerance (Gi) Diarrhea and Nausea And Vomiting   Lexapro [Escitalopram Oxalate]     nausea   Topamax [Topiramate] Itching   Prednisone Palpitations    Blood pressure (!) 152/68, pulse 66, SpO2 99 %.   Assessment/Plan:  1. Hypertension -  Essential hypertension Assessment:  BP in clinic elevated at 152/68. Patient reported taking Cardizem CD '240mg'$  and Metoprolol ER '50mg'$ . Discontinued valsartan 10/19/22.  Home BP readings fluctuate. Since medication change 10/19/22 readings range between upper 120s - 140s. Patient reports hot flashes and left sided pain (ear, head, tooth, shoulder, elbow) accompanied by nausea, and GI issues  Patient denies dizziness, lightheadedness, chest pain, SOB, numbness, pain does not radiate down the arm Patient has primary care appointment scheduled for June. Neurologist appointment next month.    Plan:  Recommended follow-up with primary care - call for earlier apt -and neurology for left sided pain.  Discussed hot flashes may not be due to BP since readings are inconsistent during them.  Advised patient to call if consistently getting home readings in the 140s - 150s.  Continue Cardizem CD '240mg'$  and Metoprolol ER '50mg'$  Follow- up Tuesday, April 16th at 10:30 AM   Thank you,   Wallene Huh, PharmD Candidate   Ramond Dial, Pharm.D, BCPS, CPP [facility] [place] [address] [place], [place] [address]  Phone: [phone]; Fax: [phone]

## 2022-10-23 ENCOUNTER — Ambulatory Visit: Payer: Medicare HMO | Attending: Cardiovascular Disease | Admitting: Pharmacist

## 2022-10-23 ENCOUNTER — Telehealth: Payer: Medicare HMO | Admitting: Physician Assistant

## 2022-10-23 VITALS — BP 152/68 | HR 66

## 2022-10-23 DIAGNOSIS — I10 Essential (primary) hypertension: Secondary | ICD-10-CM

## 2022-10-23 DIAGNOSIS — K644 Residual hemorrhoidal skin tags: Secondary | ICD-10-CM

## 2022-10-23 MED ORDER — DILTIAZEM HCL ER COATED BEADS 240 MG PO CP24: 240.0000 mg | ORAL_CAPSULE | Freq: Every day | ORAL | 3 refills | Status: DC

## 2022-10-23 MED ORDER — METOPROLOL SUCCINATE ER 50 MG PO TB24: 50.0000 mg | ORAL_TABLET | Freq: Every day | ORAL | 3 refills | Status: DC

## 2022-10-23 MED ORDER — HYDROCORTISONE (PERIANAL) 2.5 % EX CREA: 1.0000 | TOPICAL_CREAM | Freq: Two times a day (BID) | CUTANEOUS | 0 refills | Status: DC

## 2022-10-23 NOTE — Progress Notes (Signed)

## 2022-10-23 NOTE — Patient Instructions (Signed)
Summary of today's discussion  1.Continue diltiazem '240mg'$  daily and Metoprolol '50mg'$  daily  2. Please continue checking blood pressure at home  3.Bring readings with you to next appointment  4. Continue walking  5. Please call me if blood pressure is >150 consisently.   Your blood pressure goal is <130/80  To check your pressure at home you will need to:  1. Sit up in a chair, with feet flat on the floor and back supported. Do not cross your ankles or legs. 2. Rest your left arm so that the cuff is about heart level. If the cuff goes on your upper arm,  then just relax the arm on the table, arm of the chair or your lap. If you have a wrist cuff, we  suggest relaxing your wrist against your chest (think of it as Pledging the Flag with the  wrong arm).  3. Place the cuff snugly around your arm, about 1 inch above the crook of your elbow. The  cords should be inside the groove of your elbow.  4. Sit quietly, with the cuff in place, for about 5 minutes. After that 5 minutes press the power  button to start a reading. 5. Do not talk or move while the reading is taking place.  6. Record your readings on a sheet of paper. Although most cuffs have a memory, it is often  easier to see a pattern developing when the numbers are all in front of you.  7. You can repeat the reading after 1-3 minutes if it is recommended  Make sure your bladder is empty and you have not had caffeine or tobacco within the last 30 min  Always bring your blood pressure log with you to your appointments. If you have not brought your monitor in to be double checked for accuracy, please bring it to your next appointment.  You can find a list of validated (accurate) blood pressure cuffs at [employer]   Important lifestyle changes to control high blood pressure  Intervention  Effect on the BP  Lose extra pounds and watch your waistline Weight loss is one of the most effective lifestyle changes for controlling blood  pressure. If you're overweight or obese, losing even a small amount of weight can help reduce blood pressure. Blood pressure might go down by about 1 millimeter of mercury (mm Hg) with each kilogram (about 2.2 pounds) of weight lost.  Exercise regularly As a general goal, aim for at least 30 minutes of moderate physical activity every day. Regular physical activity can lower high blood pressure by about 5 to 8 mm Hg.  Eat a healthy diet Eating a diet rich in whole grains, fruits, vegetables, and low-fat dairy products and low in saturated fat and cholesterol. A healthy diet can lower high blood pressure by up to 11 mm Hg.  Reduce salt (sodium) in your diet Even a small reduction of sodium in the diet can improve heart health and reduce high blood pressure by about 5 to 6 mm Hg.  Limit alcohol One drink equals 12 ounces of beer, 5 ounces of wine, or 1.5 ounces of 80-proof liquor.  Limiting alcohol to less than one drink a day for women or two drinks a day for men can help lower blood pressure by about 4 mm Hg.   Please call me at [phone] with any questions.

## 2022-10-23 NOTE — Assessment & Plan Note (Addendum)
Assessment:  BP in clinic elevated at 152/68. Patient reported taking Cardizem CD '240mg'$  and Metoprolol ER '50mg'$ . Discontinued valsartan 10/19/22.  Home BP readings fluctuate. Since medication change 10/19/22 readings range between upper 120s - 140s. Patient reports hot flashes and left sided pain (ear, head, tooth, shoulder, elbow) accompanied by nausea, and GI issues  Patient denies dizziness, lightheadedness, chest pain, SOB, numbness, pain does not radiate down the arm Patient has primary care appointment scheduled for June. Neurologist appointment next month.   Plan:  Recommended follow-up with primary care - call for earlier apt -and neurology for left sided pain.  Discussed hot flashes may not be due to BP since readings are inconsistent during them.  Advised patient to call if consistently getting home readings in the 140s - 150s.  Continue Cardizem CD '240mg'$  and Metoprolol ER '50mg'$  Follow- up Tuesday, April 16th at 10:30 AM

## 2022-10-23 NOTE — Progress Notes (Signed)
I have spent 5 minutes in review of e-visit questionnaire, review and updating patient chart, medical decision making and response to patient.   Sonia Bromell Cody Lexiana Spindel, PA-C    

## 2022-10-24 ENCOUNTER — Other Ambulatory Visit (HOSPITAL_COMMUNITY): Payer: Self-pay

## 2022-10-24 ENCOUNTER — Telehealth: Payer: Self-pay | Admitting: Family Medicine

## 2022-10-24 NOTE — Telephone Encounter (Signed)
Patient called in stating that her insurance company [employer] sent in a prior authorization for medication  promethazine (PHENERGAN) 25 MG tablet . She said that they sent it  2 weeks ago,she was calling to check on the status regarding this so that she can receive her medication from the pharmacy.

## 2022-10-29 ENCOUNTER — Other Ambulatory Visit (HOSPITAL_COMMUNITY): Payer: Self-pay

## 2022-10-29 ENCOUNTER — Encounter: Payer: Self-pay | Admitting: Neurology

## 2022-10-29 NOTE — Telephone Encounter (Signed)
Patient Advocate Encounter  Prior Authorization for Promethazine '25mg'$  has been approved.    Effective dates: 10/29/22 through 08/18/23    Approval letter attached to charts

## 2022-10-29 NOTE — Telephone Encounter (Signed)
Received fax from [facility] for additional information. Completed and faxed back to [phone]. Marked expedited/urgent.

## 2022-10-30 MED ORDER — DULOXETINE HCL 30 MG PO CPEP: 30.0000 mg | ORAL_CAPSULE | Freq: Every day | ORAL | 3 refills | Status: DC

## 2022-11-11 ENCOUNTER — Ambulatory Visit (INDEPENDENT_AMBULATORY_CARE_PROVIDER_SITE_OTHER): Payer: Medicare HMO | Admitting: Family Medicine

## 2022-11-11 VITALS — BP 134/66 | HR 64 | Temp 98.2°F | Ht 66.0 in | Wt 147.8 lb

## 2022-11-11 DIAGNOSIS — R519 Headache, unspecified: Secondary | ICD-10-CM

## 2022-11-11 DIAGNOSIS — G8929 Other chronic pain: Secondary | ICD-10-CM

## 2022-11-11 DIAGNOSIS — M47812 Spondylosis without myelopathy or radiculopathy, cervical region: Secondary | ICD-10-CM

## 2022-11-11 DIAGNOSIS — H6992 Unspecified Eustachian tube disorder, left ear: Secondary | ICD-10-CM | POA: Diagnosis not present

## 2022-11-11 NOTE — Progress Notes (Signed)
[facility] [address] [address] Dept: [phone] Dept Fax: [phone]  Office Visit  Subjective:    Patient ID: Megan [name], female    DOB: 1951/06/07, 72 y.o..   MRN: [id number]  Chief Complaint  Patient presents with   Ear Pain    C/o having LT ear, neck, shoulder x 1 month.  No OTC meds.    History of Present Illness:  Patient is in today complaining of ongoing issues with intermittent fullness of the left ear. She notes that at times, she has difficulty with muffled hearing. She has a history of chronic rhinitis and reports being told she had a deviated septum. She is prescribed Flonase nasal spray, but notes this causes drying in her nose.  [name] notes that the ear issue is also tied to pain she has in the left side of her head. She has a history of chronic left-sided headaches. She sees Dr. Krista [name] (neurology) concerning this. She describes her ear issues leading to the pain in the head, but also radiating down her neck, into her left shoulder, and radiating down the left arm. She is not sure about inciting or alleviating factors. She uses the shoulder without any trouble. She denies any numbness or weakness in the arms.  Past Medical History: Patient Active Problem List   Diagnosis Date Noted   Dysfunction of left eustachian tube 11/11/2022   Cervical spondylosis 05/28/2022   Constipation by outlet dysfunction 05/26/2022   Degenerative disc disease, cervical 05/03/2022   Allergic rhinitis 12/31/2021   Pulmonary hypertension, unspecified ([place]) 12/22/2021   Mild cognitive impairment 12/18/2021   Genitourinary syndrome of menopause 10/25/2021   Arthralgia of left temporomandibular joint 04/15/2021   Lacunar infarction ([place]) 02/07/2021   Adrenal cortical nodule ([place]) 02/05/2021   Bladder rupture 12/10/2020   Urinary retention 11/21/2020   Cystocele with prolapse 11/15/2020   Asthma  11/05/2020   Uterine prolapse 11/05/2020   Episodic cluster headache, not intractable 09/14/2020   Chronic left-sided headache 08/09/2020   Cerebral vascular disease 08/09/2020   Depression with anxiety 08/09/2020   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   Closed fracture of right distal radius 01/12/2019   Moderate obstructive sleep apnea 05/21/2017   Abdominal aortic atherosclerosis ([place]) 07/16/2016   Renal artery stenosis ([place]) 06/12/2016   Chronic low back pain 08/24/2015   Precordial pain 06/04/2015   Hyperlipidemia, mixed 12/17/2014   Hepatic cyst 05/22/2014   Insomnia due to other mental disorder 03/10/2014   Pancreatic cyst 04/19/2013   Osteoporosis 09/07/2012   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   Palpitations 08/29/2011   Tricuspid regurgitation, Mild-Moderate 07/21/2011   Mitral regurgitation, Trivial 07/21/2011   Gastritis 06/17/2011   Endometrial hyperplasia    Diverticulosis    IBS (irritable bowel syndrome) with constipation 04/24/2011   Hiatal hernia 03/04/2011   Basal cell carcinoma (BCC) of face 11/15/2009   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   Essential hypertension 01/04/2009   Past Surgical History:  Procedure Laterality Date   APPENDECTOMY     endoscopic hemoclip     eye surgery Left    lens implant   HYSTEROSCOPY     POLYP   OPEN REDUCTION INTERNAL FIXATION (ORIF) DISTAL RADIAL FRACTURE Left 01/17/2019   Procedure: LEFT OPEN REDUCTION INTERNAL FIXATION (ORIF) DISTAL RADIUS FRACTURE;  Surgeon: Daryll Brod, MD;  Location: [facility];  Service: Orthopedics;  Laterality: Left;  AXILLARY BLOCK   PERIPHERAL VASCULAR CATHETERIZATION  Left 07/24/2016   Procedure: Renal Angiography;  Surgeon: Conrad [place], MD;  Location: [facility];  Service: Cardiovascular;  Laterality: Left;   RIGHT HEART CATH N/A 01/01/2022   Procedure: RIGHT HEART CATH;  Surgeon: Sherren Mocha, MD;  Location: [facility];  Service:  Cardiovascular;  Laterality: N/A;   ROBOTIC ASSISTED BILATERAL SALPINGO OOPHERECTOMY  11/15/2020   ROBOTIC ASSISTED LAPAROSCOPIC SACROCOLPOPEXY Bilateral 11/15/2020   Procedure: XI ROBOTIC ASSISTED LAPAROSCOPIC SACROCOLPOPEXY AND SUPRACERVICIAL HYSTERECTOMY BILATERAL SALPINGO-OOPHERECTOMY;  Surgeon: Ardis Hughs, MD;  Location: [facility];  Service: Urology;  Laterality: Bilateral;  REQUESTING 4.5 HRS   ROBOTIC ASSISTED SUPRACERVICAL HYSTERECTOMY  11/15/2020   UPPER GI ENDOSCOPY     UTERINE FIBROID SURGERY     Family History  Problem Relation Age of Onset   Colon cancer Mother    Diabetes Mother    Hypertension Mother    Colon polyps Father    Parkinson's disease Father    Colon cancer Maternal Grandmother    Breast cancer Maternal Grandmother 77   Diabetes Paternal Grandmother    Breast cancer Paternal Grandmother 70   Allergies Sister    Arthritis Brother        b/l hip replace   Alcohol abuse Daughter    Arthritis Son    Hypertension Son    Allergies Son    Osteoporosis Son    Osteoporosis Son    Arthritis Son        back disease, DDD   Allergies Son    Arthritis Son    Irritable bowel syndrome Son        RSD   Hypertension Paternal Grandfather    Heart disease Paternal Grandfather    Aneurysm Paternal Grandfather    Colon cancer Maternal Aunt    Colon cancer Cousin        X3   Ovarian cancer Cousin    Outpatient Medications Prior to Visit  Medication Sig Dispense Refill   albuterol (VENTOLIN HFA) 108 (90 Base) MCG/ACT inhaler TAKE 2 PUFFS BY MOUTH EVERY 6 HOURS AS NEEDED FOR WHEEZE OR SHORTNESS OF BREATH 8.5 each 1   ALPRAZolam (XANAX) 1 MG tablet TAKE 1 TABLET BY MOUTH TWICE A DAY AS NEEDED 30 tablet 1   Alum & Mag Hydroxide-Simeth (MYLANTA PO) Take by mouth. PRN     Alum Hydroxide-Mag Carbonate (GAVISCON PO) Take by mouth. PRN     aspirin EC 81 MG tablet Take 81 mg by mouth daily. Swallow whole.     Biotin 10 MG TABS Take by mouth.     cholecalciferol  (VITAMIN D3) 25 MCG (1000 UNIT) tablet Take 5,000 Units by mouth daily.     dicyclomine (BENTYL) 10 MG capsule      diltiazem (CARDIZEM CD) 240 MG 24 hr capsule Take 1 capsule (240 mg total) by mouth daily. 90 capsule 3   DULoxetine (CYMBALTA) 30 MG capsule Take 1 capsule (30 mg total) by mouth daily. 30 capsule 3   Evolocumab (REPATHA SURECLICK) XX123456 MG/ML SOAJ Inject 140 mg into the skin every 14 (fourteen) days. 6 mL 3   famotidine-calcium carbonate-magnesium hydroxide (PEPCID COMPLETE) 10-800-165 MG chewable tablet Chew 1 tablet by mouth daily as needed.     fluticasone (FLONASE) 50 MCG/ACT nasal spray SPRAY 2 SPRAYS INTO EACH NOSTRIL EVERY DAY 48 mL 6   gabapentin (NEURONTIN) 300 MG capsule Take 1 capsule twice daily 60 capsule 5   hydrocortisone (ANUSOL-HC) 2.5 % rectal cream Place 1 Application rectally  2 (two) times daily. 30 g 0   metoprolol succinate (TOPROL-XL) 50 MG 24 hr tablet Take 1 tablet (50 mg total) by mouth daily. Take with or immediately following a meal. 90 tablet 3   Multiple Vitamin (MULTIVITAMIN) tablet Take 1 tablet by mouth daily.     ondansetron (ZOFRAN) 4 MG tablet TAKE 1 TABLET BY MOUTH EVERY 8 HOURS AS NEEDED FOR NAUSEA AND VOMITING 20 tablet 3   promethazine (PHENERGAN) 25 MG tablet Take 1 tablet (25 mg total) by mouth every 6 (six) hours as needed for nausea or vomiting. 30 tablet 2   sucralfate (CARAFATE) 1 GM/10ML suspension Take 10 mLs (1 g total) by mouth 4 (four) times daily -  with meals and at bedtime. 420 mL 1   tiZANidine (ZANAFLEX) 4 MG tablet Take 1 tablet (4 mg total) by mouth every 8 (eight) hours as needed for muscle spasms. 30 tablet 2   Vitamin E (VITAMIN E/D-ALPHA NATURAL) 268 MG (400 UNIT) CAPS Take by mouth.     estradiol (ESTRACE) 0.1 MG/GM vaginal cream Place 0.5 g vaginally 2 (two) times a week. Place 0.5g nightly for two weeks then twice a week after (Patient not taking: Reported on 11/11/2022) 30 g 11   butalbital-acetaminophen-caffeine  (FIORICET) 50-325-40 MG tablet TAKE 1 TABLET BY MOUTH EVERY 6 HOURS AS NEEDED FOR HEADACHE. (Patient not taking: Reported on 07/30/2022) 10 tablet 3   cyclobenzaprine (FLEXERIL) 5 MG tablet Take 1 tablet (5 mg total) by mouth 3 (three) times daily as needed for muscle spasms. 60 tablet 1   IBSRELA 50 MG TABS Take 1 tablet by mouth 2 (two) times daily.     ibuprofen (ADVIL) 600 MG tablet Take 1 tablet (600 mg total) by mouth every 8 (eight) hours as needed. 30 tablet 0   RABEprazole (ACIPHEX) 20 MG tablet Take 20 mg by mouth 2 (two) times daily.     valsartan (DIOVAN) 80 MG tablet Take 1 tablet (80 mg total) by mouth daily. 90 tablet 1   No facility-administered medications prior to visit.   Allergies  Allergen Reactions   Sulfamethoxazole Shortness Of Breath    chest tightness   Amitriptyline Anxiety and Other (See Comments)    Depression, insomnia   Cymbalta [Duloxetine Hcl] Other (See Comments)    Insomnia, constipation   Lactose Intolerance (Gi) Diarrhea and Nausea And Vomiting   Lexapro [Escitalopram Oxalate]     nausea   Topamax [Topiramate] Itching   Prednisone Palpitations     Objective:   Today's Vitals   11/11/22 0800  BP: 134/66  Pulse: 64  Temp: 98.2 F (36.8 C)  TempSrc: Temporal  SpO2: 96%  Weight: 147 lb 12.8 oz (67 kg)  Height: 5\' 6"  (1.676 m)   Body mass index is 23.86 kg/m.   General: Well developed, well nourished. No acute distress. HEENT: Normocephalic, non-traumatic. External ears normal. EAC and TMs normal bilaterally. Nose    clear without congestion or rhinorrhea. Mucous membranes moist. Oropharynx clear. Good dentition. Neck: Supple. No tenderness to palpation. Extremities: Full ROM of the left UE. No joint swelling or tenderness. No edema noted. Neuro: Normal sensation, strength and DTR of the UEs. Psych: Alert and oriented. Normal mood and affect.  Health Maintenance Due  Topic Date Due   Hepatitis C Screening  Never done   Zoster Vaccines-  Shingrix (1 of 2) Never done   Pneumonia Vaccine 42+ Years old (1 of 1 - PCV) Never done   [facility] (  AWV)  07/01/2019   Imaging: MR of the Cervical Spine (05/27/19/23) IMPRESSION: Disc degeneration and spondylosis at C4-5 and C5-6. Mild spinal stenosis and foraminal stenosis bilaterally at both levels.    Assessment & Plan:   Problem List Items Addressed This Visit       Nervous and Auditory   Dysfunction of left eustachian tube    The left ear appears normal. The symptoms are consistent with ETD, likely secondary to chronic rhinitis. Ms. Supinger would like to see an ENT to discuss her nasal issues and septal deviation. I recommend she continue use of her Flonase nasal spray and gentle valsalva to open her Eustachian tube.      Relevant Orders   Ambulatory referral to ENT     Musculoskeletal and Integument   Cervical spondylosis - Primary    The left neck, parietal, and arm pain are likely due to her cervical spondylosis and foraminal stenosis. I will refer her to neurosurgery to consider options for management. In the meantime, I recommend she use heat and take Tylenol as needed for pain.      Relevant Orders   Ambulatory referral to Neurosurgery     Other   Chronic left-sided headache    There is a component of cervical neuritis in this. She should continue to follow with Dr. Krista [name] regarding the headaches.       Return for Follow-up as scheduled.   Haydee Salter, MD

## 2022-11-11 NOTE — Assessment & Plan Note (Signed)
The left ear appears normal. The symptoms are consistent with ETD, likely secondary to chronic rhinitis. Megan [name] would like to see an ENT to discuss her nasal issues and septal deviation. I recommend she continue use of her Flonase nasal spray and gentle valsalva to open her Eustachian tube.

## 2022-11-11 NOTE — Assessment & Plan Note (Signed)
There is a component of cervical neuritis in this. She should continue to follow with Dr. Krista Blue regarding the headaches.

## 2022-11-11 NOTE — Assessment & Plan Note (Signed)
The left neck, parietal, and arm pain are likely due to her cervical spondylosis and foraminal stenosis. I will refer her to neurosurgery to consider options for management. In the meantime, I recommend she use heat and take Tylenol as needed for pain.

## 2022-11-18 ENCOUNTER — Other Ambulatory Visit: Payer: Self-pay | Admitting: Family Medicine

## 2022-11-18 DIAGNOSIS — J4520 Mild intermittent asthma, uncomplicated: Secondary | ICD-10-CM

## 2022-11-20 ENCOUNTER — Ambulatory Visit
Admission: RE | Admit: 2022-11-20 | Discharge: 2022-11-20 | Disposition: A | Discharge status: Home / Self Care | Payer: Medicare HMO | Source: Ambulatory Visit | Attending: Family Medicine | Admitting: Family Medicine

## 2022-11-20 DIAGNOSIS — Z1231 Encounter for screening mammogram for malignant neoplasm of breast: Secondary | ICD-10-CM | POA: Diagnosis not present

## 2022-11-21 ENCOUNTER — Other Ambulatory Visit: Payer: Self-pay | Admitting: Neurology

## 2022-11-26 ENCOUNTER — Telehealth: Payer: Medicare HMO | Admitting: Nurse Practitioner

## 2022-11-26 ENCOUNTER — Other Ambulatory Visit: Payer: Self-pay | Admitting: Family Medicine

## 2022-11-26 DIAGNOSIS — F341 Dysthymic disorder: Secondary | ICD-10-CM

## 2022-11-26 DIAGNOSIS — J0100 Acute maxillary sinusitis, unspecified: Secondary | ICD-10-CM

## 2022-11-26 MED ORDER — AMOXICILLIN-POT CLAVULANATE 875-125 MG PO TABS: 1.0000 | ORAL_TABLET | Freq: Two times a day (BID) | ORAL | 0 refills | Status: DC

## 2022-11-26 NOTE — Progress Notes (Signed)

## 2022-11-26 NOTE — Telephone Encounter (Signed)
Refill request for  Alprazolam 1 mg LR 09/18/22, #30, 1 rf LOV  11/11/22 FOV  01/29/23  Please review and advise.  Thanks. Dm/cma

## 2022-12-02 ENCOUNTER — Ambulatory Visit: Payer: Medicare HMO

## 2022-12-05 ENCOUNTER — Ambulatory Visit: Payer: Medicare HMO | Admitting: Orthopedic Surgery

## 2022-12-10 ENCOUNTER — Other Ambulatory Visit: Payer: Self-pay | Admitting: Family Medicine

## 2022-12-10 DIAGNOSIS — K581 Irritable bowel syndrome with constipation: Secondary | ICD-10-CM

## 2022-12-18 ENCOUNTER — Emergency Department (HOSPITAL_COMMUNITY): Payer: Medicare HMO

## 2022-12-18 ENCOUNTER — Encounter (HOSPITAL_COMMUNITY): Payer: Self-pay

## 2022-12-18 ENCOUNTER — Emergency Department (HOSPITAL_COMMUNITY)
Admission: EM | Admit: 2022-12-18 | Discharge: 2022-12-18 | Disposition: A | Discharge status: Home / Self Care | Payer: Medicare HMO | Attending: Emergency Medicine | Admitting: Emergency Medicine

## 2022-12-18 DIAGNOSIS — E278 Other specified disorders of adrenal gland: Secondary | ICD-10-CM | POA: Diagnosis not present

## 2022-12-18 DIAGNOSIS — Z87891 Personal history of nicotine dependence: Secondary | ICD-10-CM | POA: Diagnosis not present

## 2022-12-18 DIAGNOSIS — E039 Hypothyroidism, unspecified: Secondary | ICD-10-CM | POA: Insufficient documentation

## 2022-12-18 DIAGNOSIS — R1900 Intra-abdominal and pelvic swelling, mass and lump, unspecified site: Secondary | ICD-10-CM | POA: Diagnosis present

## 2022-12-18 DIAGNOSIS — Z7982 Long term (current) use of aspirin: Secondary | ICD-10-CM | POA: Insufficient documentation

## 2022-12-18 DIAGNOSIS — J45909 Unspecified asthma, uncomplicated: Secondary | ICD-10-CM | POA: Diagnosis not present

## 2022-12-18 DIAGNOSIS — R339 Retention of urine, unspecified: Secondary | ICD-10-CM | POA: Insufficient documentation

## 2022-12-18 DIAGNOSIS — Z79899 Other long term (current) drug therapy: Secondary | ICD-10-CM | POA: Insufficient documentation

## 2022-12-18 DIAGNOSIS — K5790 Diverticulosis of intestine, part unspecified, without perforation or abscess without bleeding: Secondary | ICD-10-CM

## 2022-12-18 DIAGNOSIS — K760 Fatty (change of) liver, not elsewhere classified: Secondary | ICD-10-CM | POA: Diagnosis not present

## 2022-12-18 DIAGNOSIS — K5730 Diverticulosis of large intestine without perforation or abscess without bleeding: Secondary | ICD-10-CM | POA: Diagnosis not present

## 2022-12-18 DIAGNOSIS — I10 Essential (primary) hypertension: Secondary | ICD-10-CM | POA: Insufficient documentation

## 2022-12-18 DIAGNOSIS — M7989 Other specified soft tissue disorders: Secondary | ICD-10-CM | POA: Diagnosis not present

## 2022-12-18 DIAGNOSIS — Z85060 Personal history of malignant carcinoid tumor of small intestine: Secondary | ICD-10-CM | POA: Insufficient documentation

## 2022-12-18 LAB — URINALYSIS, ROUTINE W REFLEX MICROSCOPIC
Bacteria, UA: NONE SEEN
Bilirubin Urine: NEGATIVE
Glucose, UA: NEGATIVE mg/dL
Ketones, ur: NEGATIVE mg/dL
Nitrite: NEGATIVE
Protein, ur: NEGATIVE mg/dL
Specific Gravity, Urine: 1.002 — ABNORMAL LOW (ref 1.005–1.030)
pH: 6 (ref 5.0–8.0)

## 2022-12-18 LAB — COMPREHENSIVE METABOLIC PANEL
ALT: 18 U/L (ref 0–44)
AST: 22 U/L (ref 15–41)
Albumin: 4.2 g/dL (ref 3.5–5.0)
Alkaline Phosphatase: 90 U/L (ref 38–126)
Anion gap: 9 (ref 5–15)
BUN: 8 mg/dL (ref 8–23)
CO2: 24 mmol/L (ref 22–32)
Calcium: 8.7 mg/dL — ABNORMAL LOW (ref 8.9–10.3)
Chloride: 100 mmol/L (ref 98–111)
Creatinine, Ser: 0.66 mg/dL (ref 0.44–1.00)
GFR, Estimated: >60 mL/min (ref >60)
Glucose, Bld: 105 mg/dL — ABNORMAL HIGH (ref 70–99)
Potassium: 3.4 mmol/L — ABNORMAL LOW (ref 3.5–5.1)
Sodium: 133 mmol/L — ABNORMAL LOW (ref 135–145)
Total Bilirubin: 0.5 mg/dL (ref 0.3–1.2)
Total Protein: 7.6 g/dL (ref 6.5–8.1)

## 2022-12-18 LAB — CBC WITH DIFFERENTIAL/PLATELET
Abs Immature Granulocytes: 0.02 10*3/uL (ref 0.00–0.07)
Basophils Absolute: 0.1 10*3/uL (ref 0.0–0.1)
Basophils Relative: 1 %
Eosinophils Absolute: 0.3 10*3/uL (ref 0.0–0.5)
Eosinophils Relative: 4 %
HCT: 41.7 % (ref 36.0–46.0)
Hemoglobin: 13.6 g/dL (ref 12.0–15.0)
Immature Granulocytes: 0 %
Lymphocytes Relative: 44 %
Lymphs Abs: 3.9 10*3/uL (ref 0.7–4.0)
MCH: 31.1 pg (ref 26.0–34.0)
MCHC: 32.6 g/dL (ref 30.0–36.0)
MCV: 95.4 fL (ref 80.0–100.0)
Monocytes Absolute: 1 10*3/uL (ref 0.1–1.0)
Monocytes Relative: 12 %
Neutro Abs: 3.4 10*3/uL (ref 1.7–7.7)
Neutrophils Relative %: 39 %
Platelets: 268 10*3/uL (ref 150–400)
RBC: 4.37 MIL/uL (ref 3.87–5.11)
RDW: 12.1 % (ref 11.5–15.5)
WBC: 8.7 10*3/uL (ref 4.0–10.5)
nRBC: 0 % (ref 0.0–0.2)

## 2022-12-18 LAB — LIPASE, BLOOD: Lipase: 36 U/L (ref 11–51)

## 2022-12-18 LAB — BRAIN NATRIURETIC PEPTIDE: B Natriuretic Peptide: 81 pg/mL (ref 0.0–100.0)

## 2022-12-18 MED ORDER — IOHEXOL 300 MG/ML  SOLN
100.0000 mL | Freq: Once | INTRAMUSCULAR | Status: AC | PRN
  Administered 2022-12-18: 100 mL via INTRAVENOUS

## 2022-12-18 MED ORDER — SODIUM CHLORIDE (PF) 0.9 % IJ SOLN
INTRAMUSCULAR | Status: AC
  Filled 2022-12-18: qty 50

## 2022-12-18 MED ORDER — SODIUM CHLORIDE 0.9 % IV BOLUS
1000.0000 mL | Freq: Once | INTRAVENOUS | Status: AC
  Administered 2022-12-18: 1000 mL via INTRAVENOUS

## 2022-12-18 NOTE — Discharge Instructions (Addendum)
It was a pleasure caring for you today in the emergency department.  There is a stable nodule on your adrenal gland from prior imaging that was shown again today.  Grossly unchanged from prior  Please return to the emergency department for any worsening or worrisome symptoms.  Continue rehydration over next 2 days, please follow up with urology as needed.   Unfortunately you have an allergy to sulfa medications which interacts with flomax so unable to prescribe this.

## 2022-12-18 NOTE — ED Triage Notes (Signed)
Pt arrived POV for urinary rentention and bilateral leg swelling around her knees. Pt reports took her hydrochlorothiazide 25 mg around 11 pm tonight and still have not been able to urinate. Pt report has hx of a bladder swing. NAD noted, A&O x4.

## 2022-12-18 NOTE — ED Notes (Signed)
Pt verbalized understanding to discharge instructions. IV removed. Pt ambulated from ed family to drive home

## 2022-12-18 NOTE — ED Notes (Signed)
Pt had one unmeasured urine occurrence pt reports large volume of urine

## 2022-12-18 NOTE — ED Provider Notes (Signed)
[place] EMERGENCY DEPARTMENT AT [facility] Provider Note  CSN: [id number] Arrival date & time: 12/18/22 0101  Chief Complaint(s) Urinary Retention  HPI Megan [name] is a 72 y.o. female with past medical history as below, significant for anxiety, Barrett's esophagus, diverticulosis, GERD, HLD, HTN, tricuspid regurg who presents to the ED with complaint of leg swelling/diff w/ urination.  Ongoing over the last 24 hours, she has been drinking liquids appropriately, taking her typical home medications but having difficulty with urination.  Feels her abdomen is swollen she is having suprapubic discomfort and pressure sensation.  No vomiting.  Reduced bowel movements for the past few days, no melena or BRBPR.  No fevers or chills.  Hematuria or dysuria.  Follow[facility] with [facility]  Past Medical History Past Medical History:  Diagnosis Date   Adrenal adenoma 05/22/2014   Adrenal gland cyst (HCC) 05/22/2014   Allergic state 12/31/2012   Anxiety    Asthma    triggerred by fragrances    Barrett esophagus    Blind loop syndrome    C. difficile diarrhea    Cancer (HCC)    pt denies    Chest pain, atypical 07/14/2011   Cold sore 07/17/2016   Colon polyp    Contact dermatitis 03/23/2012   Cystocele, midline    Depression    Diarrhea    Diverticulosis    Endometrial hyperplasia 02/27/2006   BENIGN ENDO BX ON 02/2007   GERD (gastroesophageal reflux disease)    Headache 04/16/2015   Headache    Headache(784.0) 04/15/2012   pt denies    Heart murmur    Hematuria 04/27/2012   Hepatic cyst    Hiatal hernia    Hiatal hernia    Hyperlipidemia, mixed 12/17/2014   Hypertension    IBS (irritable bowel syndrome)    Insomnia    Leaky heart valve    Low back pain 08/15/2013   Osteoporosis 03/2017   T score -2.5. 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   Sinusitis acute 02/03/2012   Thrush 07/21/2011   Tricuspid regurgitation, Mild-Moderate 07/21/2011   Moderate by 2 d echocardiogram Echo 11/2021: EF 60-65, no RWMA, normal RVSF, moderately elevated PASP, RVSP 52.1, trivial MR, mild to moderate TR   Unspecified hypothyroidism    Unspecified menopausal and postmenopausal disorder    Uterine prolapse without mention of vaginal wall prolapse    Vitamin B12 deficiency    Vitamin D deficiency 07/17/2016   Patient Active Problem List   Diagnosis Date Noted   Dysfunction of left eustachian tube 11/11/2022   Cervical spondylosis 05/28/2022   Constipation by outlet dysfunction 05/26/2022   Degenerative disc disease, cervical 05/03/2022   Allergic rhinitis 12/31/2021   Pulmonary hypertension, unspecified (HCC) 12/22/2021   Mild cognitive impairment 12/18/2021   Genitourinary syndrome of menopause 10/25/2021   Arthralgia of left temporomandibular joint 04/15/2021   Lacunar infarction (HCC) 02/07/2021   Adrenal cortical nodule (HCC) 02/05/2021   Bladder rupture 12/10/2020   Urinary retention 11/21/2020   Cystocele with prolapse 11/15/2020   Asthma 11/05/2020   Uterine prolapse 11/05/2020   Episodic cluster headache, not intractable 09/14/2020   Chronic left-sided headache 08/09/2020   Cerebral vascular disease 08/09/2020   Depression with anxiety 08/09/2020   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  Closed fracture of right distal radius 01/12/2019   Moderate obstructive sleep apnea 05/21/2017   Abdominal aortic atherosclerosis (HCC) 07/16/2016   Renal artery stenosis (HCC) 06/12/2016   Chronic low back pain 08/24/2015   Precordial pain 06/04/2015   Hyperlipidemia, mixed 12/17/2014   Hepatic cyst 05/22/2014   Insomnia due to other mental disorder 03/10/2014   Pancreatic cyst 04/19/2013   Osteoporosis 09/07/2012   Right sacroiliac  joint disease 04/27/2012   Palpitations 08/29/2011   Tricuspid regurgitation, Mild-Moderate 07/21/2011   Mitral regurgitation, Trivial 07/21/2011   Gastritis 06/17/2011   Endometrial hyperplasia    Diverticulosis    IBS (irritable bowel syndrome) with constipation 04/24/2011   Hiatal hernia 03/04/2011   Basal cell carcinoma (BCC) of face 11/15/2009   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 Authorizing Provider  albuterol (VENTOLIN HFA) 108 (90 Base) MCG/ACT inhaler TAKE 2 PUFFS BY MOUTH EVERY 6 HOURS AS NEEDED FOR WHEEZE OR SHORTNESS OF BREATH 11/18/22   Loyola Mast, MD  ALPRAZolam Prudy Feeler) 1 MG tablet TAKE 1 TABLET BY MOUTH TWICE A DAY AS NEEDED 11/26/22   Loyola Mast, MD  Alum & Mag Hydroxide-Simeth (MYLANTA PO) Take by mouth. PRN    [provider]  Alum Hydroxide-Mag Carbonate (GAVISCON PO) Take by mouth. PRN    [provider]  amoxicillin-clavulanate (AUGMENTIN) 875-125 MG tablet Take 1 tablet by mouth 2 (two) times daily. 11/26/22   Daphine Deutscher, Mary-Margaret, FNP  aspirin EC 81 MG tablet Take 81 mg by mouth daily. Swallow whole.    [provider]  Biotin 10 MG TABS Take by mouth.    [provider]  cholecalciferol (VITAMIN D3) 25 MCG (1000 UNIT) tablet Take 5,000 Units by mouth daily.    [provider]  dicyclomine (BENTYL) 10 MG capsule  12/14/20   [provider]  diltiazem (CARDIZEM CD) 240 MG 24 hr capsule Take 1 capsule (240 mg total) by mouth daily. 10/23/22   Tonny Bollman, MD  DULoxetine (CYMBALTA) 30 MG capsule TAKE 1 CAPSULE BY MOUTH EVERY DAY 11/24/22   Glean Salvo, NP  estradiol (ESTRACE) 0.1 MG/GM vaginal cream Place 0.5 g vaginally 2 (two) times a week. Place 0.5g nightly for two weeks then twice a week after Patient not taking: Reported on 11/11/2022 10/10/21   Marguerita Beards, MD   Evolocumab (REPATHA SURECLICK) 140 MG/ML SOAJ Inject 140 mg into the skin every 14 (fourteen) days. 06/17/22   Pavero, Cristal Deer, RPH  famotidine-calcium carbonate-magnesium hydroxide (PEPCID COMPLETE) 10-800-165 MG chewable tablet Chew 1 tablet by mouth daily as needed.    [provider]  fluticasone (FLONASE) 50 MCG/ACT nasal spray SPRAY 2 SPRAYS INTO EACH NOSTRIL EVERY DAY 06/25/22   Loyola Mast, MD  gabapentin (NEURONTIN) 300 MG capsule Take 1 capsule twice daily 07/02/22   Glean Salvo, NP  hydrocortisone (ANUSOL-HC) 2.5 % rectal cream Place 1 Application rectally 2 (two) times daily. 10/23/22   Waldon Merl, PA-C  metoprolol succinate (TOPROL-XL) 50 MG 24 hr tablet Take 1 tablet (50 mg total) by mouth daily. Take with or immediately following a meal. 10/23/22   Tonny Bollman, MD  Multiple Vitamin (MULTIVITAMIN) tablet Take 1 tablet by mouth daily.    [provider]  ondansetron (ZOFRAN) 4 MG tablet TAKE 1 TABLET BY MOUTH EVERY 8 HOURS AS NEEDED FOR NAUSEA AND VOMITING 07/30/22  Loyola Mast, MD  promethazine (PHENERGAN) 25 MG tablet TAKE 1 TABLET BY MOUTH EVERY 6 HOURS AS NEEDED FOR NAUSEA OR VOMITING. 12/11/22   Loyola Mast, MD  sucralfate (CARAFATE) 1 GM/10ML suspension Take 10 mLs (1 g total) by mouth 4 (four) times daily -  with meals and at bedtime. 08/07/22   Arby Barrette, MD  tiZANidine (ZANAFLEX) 4 MG tablet Take 1 tablet (4 mg total) by mouth every 8 (eight) hours as needed for muscle spasms. 07/02/22   Glean Salvo, NP  Vitamin E (VITAMIN E/D-ALPHA NATURAL) 268 MG (400 UNIT) CAPS Take by mouth.    [provider]                                                                                                                                    Past Surgical History Past Surgical History:  Procedure Laterality Date   APPENDECTOMY     endoscopic hemoclip     eye surgery Left    lens implant   HYSTEROSCOPY     POLYP   OPEN  REDUCTION INTERNAL FIXATION (ORIF) DISTAL RADIAL FRACTURE Left 01/17/2019   Procedure: LEFT OPEN REDUCTION INTERNAL FIXATION (ORIF) DISTAL RADIUS FRACTURE;  Surgeon: Cindee Salt, MD;  Location: [facility];  Service: Orthopedics;  Laterality: Left;  AXILLARY BLOCK   PERIPHERAL VASCULAR CATHETERIZATION Left 07/24/2016   Procedure: Renal Angiography;  Surgeon: Fransisco Hertz, MD;  Location: [facility] INVASIVE CV LAB;  Service: Cardiovascular;  Laterality: Left;   RIGHT HEART CATH N/A 01/01/2022   Procedure: RIGHT HEART CATH;  Surgeon: Tonny Bollman, MD;  Location: [facility] INVASIVE CV LAB;  Service: Cardiovascular;  Laterality: N/A;   ROBOTIC ASSISTED BILATERAL SALPINGO OOPHERECTOMY  11/15/2020   ROBOTIC ASSISTED LAPAROSCOPIC SACROCOLPOPEXY Bilateral 11/15/2020   Procedure: XI ROBOTIC ASSISTED LAPAROSCOPIC SACROCOLPOPEXY AND SUPRACERVICIAL HYSTERECTOMY BILATERAL SALPINGO-OOPHERECTOMY;  Surgeon: Crist Fat, MD;  Location: [facility];  Service: Urology;  Laterality: Bilateral;  REQUESTING 4.5 HRS   ROBOTIC ASSISTED SUPRACERVICAL HYSTERECTOMY  11/15/2020   UPPER GI ENDOSCOPY     UTERINE FIBROID SURGERY     Family History Family History  Problem Relation Age of Onset   Colon cancer Mother    Diabetes Mother    Hypertension Mother    Colon polyps Father    Parkinson's disease Father    Colon cancer Maternal Grandmother    Breast cancer Maternal Grandmother 11   Diabetes Paternal Grandmother    Breast cancer Paternal Grandmother 41   Allergies Sister    Arthritis Brother        b/l hip replace   Alcohol abuse Daughter    Arthritis Son    Hypertension Son    Allergies Son    Osteoporosis Son    Osteoporosis Son    Arthritis Son        back disease, DDD   Allergies Son    Arthritis Son  Irritable bowel syndrome Son        RSD   Hypertension Paternal Grandfather    Heart disease Paternal Grandfather    Aneurysm Paternal Grandfather    Colon cancer Maternal Aunt    Colon cancer  Cousin        X3   Ovarian cancer Cousin     Social History Social History   Tobacco Use   Smoking status: Former    Types: Cigarettes    Quit date: 08/19/1995    Years since quitting: 27.3   Smokeless tobacco: Never  Vaping Use   Vaping Use: Never used  Substance Use Topics   Alcohol use: Never    Alcohol/week: 0.0 standard drinks of alcohol   Drug use: Never   Allergies Sulfamethoxazole, Amitriptyline, Cymbalta [duloxetine hcl], Lactose intolerance (gi), Lexapro [escitalopram oxalate], Topamax [topiramate], and Prednisone  Review of Systems Review of Systems  Constitutional:  Negative for activity change and fever.  HENT:  Negative for facial swelling and trouble swallowing.   Eyes:  Negative for discharge and redness.  Respiratory:  Negative for cough and shortness of breath.   Cardiovascular:  Negative for chest pain and palpitations.  Gastrointestinal:  Positive for abdominal distention and abdominal pain. Negative for nausea.  Genitourinary:  Positive for decreased urine volume and difficulty urinating. Negative for dysuria and flank pain.  Musculoskeletal:  Negative for back pain and gait problem.  Skin:  Negative for pallor and rash.  Neurological:  Negative for syncope and headaches.    Physical Exam Vital Signs  I have reviewed the triage vital signs BP (!) 145/66   Pulse 74   Temp 98.1 F (36.7 C) (Oral)   Resp 16   Ht 5\' 6"  (1.676 m)   Wt 63.5 kg   SpO2 98%   BMI 22.60 kg/m  Physical Exam Vitals and nursing note reviewed.  Constitutional:      General: She is not in acute distress.    Appearance: Normal appearance.  HENT:     Head: Normocephalic and atraumatic.     Right Ear: External ear normal.     Left Ear: External ear normal.     Nose: Nose normal.     Mouth/Throat:     Mouth: Mucous membranes are moist.  Eyes:     General: No scleral icterus.       Right eye: No discharge.        Left eye: No discharge.  Cardiovascular:     Rate and  Rhythm: Normal rate and regular rhythm.     Pulses: Normal pulses.     Heart sounds: Normal heart sounds.  Pulmonary:     Effort: Pulmonary effort is normal. No respiratory distress.     Breath sounds: Normal breath sounds.  Abdominal:     General: Abdomen is flat. There is distension.     Palpations: Abdomen is soft.     Tenderness: There is abdominal tenderness. There is no guarding or rebound.       Comments: Nonperitoneal abdomen  Musculoskeletal:     Right lower leg: No edema.     Left lower leg: No edema.  Skin:    General: Skin is warm and dry.     Capillary Refill: Capillary refill takes less than 2 seconds.  Neurological:     Mental Status: She is alert.  Psychiatric:        Mood and Affect: Mood normal.        Behavior: Behavior normal.  ED Results and Treatments Labs (all labs ordered are listed, but only abnormal results are displayed) Labs Reviewed  COMPREHENSIVE METABOLIC PANEL - Abnormal; Notable for the following components:      Result Value   Sodium 133 (*)    Potassium 3.4 (*)    Glucose, Bld 105 (*)    Calcium 8.7 (*)    All other components within normal limits  URINALYSIS, ROUTINE W REFLEX MICROSCOPIC - Abnormal; Notable for the following components:   Color, Urine COLORLESS (*)    Specific Gravity, Urine 1.002 (*)    Hgb urine dipstick SMALL (*)    Leukocytes,Ua TRACE (*)    All other components within normal limits  CBC WITH DIFFERENTIAL/PLATELET  BRAIN NATRIURETIC PEPTIDE  LIPASE, BLOOD                                                                                                                          Radiology CT ABDOMEN PELVIS W CONTRAST  Result Date: 12/18/2022 CLINICAL DATA:  Epigastric abdominal pain, suprapubic pain, anuria EXAM: CT ABDOMEN AND PELVIS WITH CONTRAST TECHNIQUE: Multidetector CT imaging of the abdomen and pelvis was performed using the standard protocol following bolus administration of intravenous contrast.  RADIATION DOSE REDUCTION: This exam was performed according to the departmental dose-optimization program which includes automated exposure control, adjustment of the mA and/or kV according to patient size and/or use of iterative reconstruction technique. CONTRAST:  OMNIPAQUE IOHEXOL 300 MG/ML  SOLN COMPARISON:  None Available. FINDINGS: Lower chest: No acute abnormality. Hepatobiliary: Stable cyst within the left hepatic lobe. Mild hepatic steatosis. No enhancing intrahepatic mass. No intra or extrahepatic biliary ductal dilation. Gallbladder unremarkable. Pancreas: Moderate duodenal diverticulum arising from the third portion of the duodenum demonstrates mass effect upon the pancreatic head. The pancreas is otherwise unremarkable. Spleen: Unremarkable Adrenals/Urinary Tract: Right adrenal gland is unremarkable. 14 mm left adrenal nodule demonstrating brisk enhancement is stable since remote prior examination of 02/11/2011. While indeterminate, its stability over time is in keeping with a benign lesion, likely an adrenal adenoma, and further follow-up imaging is not required. The kidneys are unremarkable. The bladder is unremarkable. Stomach/Bowel: Moderate sigmoid diverticulosis. Stomach, small bowel, and large bowel are otherwise unremarkable. Appendix absent. No free intraperitoneal gas or fluid. Vascular/Lymphatic: Aortic atherosclerosis. Circumaortic left renal vein. No enlarged abdominal or pelvic lymph nodes. Reproductive: Status post hysterectomy. No adnexal masses. Other: Tiny fat containing umbilical hernia. Musculoskeletal: No acute bone abnormality. No lytic or blastic bone lesion. Osseous structures are age-appropriate. IMPRESSION: 1. No acute intra-abdominal pathology identified. No definite radiographic explanation for the patient's reported symptoms. 2. Mild hepatic steatosis. 3. Moderate sigmoid diverticulosis without superimposed acute inflammatory change. 4. 14 mm left adrenal nodule,  stable since remote prior examination of 02/11/2011. While indeterminate, its stability over time is in keeping with a benign lesion, likely an adrenal adenoma, and further follow-up imaging is not required. Aortic Atherosclerosis (ICD10-I70.0). Electronically Signed   By: Lyda Kalata.D.  On: 12/18/2022 03:44   DG Chest Portable 1 View  Result Date: 12/18/2022 CLINICAL DATA:  Leg swelling EXAM: PORTABLE CHEST 1 VIEW COMPARISON:  09/28/2019 FINDINGS: The heart size and mediastinal contours are within normal limits. Aortic atherosclerosis. Both lungs are clear. The visualized skeletal structures are unremarkable. IMPRESSION: No active disease. Electronically Signed   By: Jasmine Pang M.D.   On: 12/18/2022 02:15    Pertinent labs & imaging results that were available during my care of the patient were reviewed by me and considered in my medical decision making (see MDM for details).  Medications Ordered in ED Medications  sodium chloride 0.9 % bolus 1,000 mL (0 mLs Intravenous Stopped 12/18/22 0334)  iohexol (OMNIPAQUE) 300 MG/ML solution 100 mL (100 mLs Intravenous Contrast Given 12/18/22 0309)                                                                                                                                     Procedures Procedures  (including critical care time)  Medical Decision Making / ED Course    Medical Decision Making:    AMBERMARIE HONEYMAN is a 72 y.o. female  with past medical history as below, significant for anxiety, Barrett's esophagus, diverticulosis, GERD, HLD, HTN, tricuspid regurg who presents to the ED with complaint of leg swelling/diff w/ urination. . The complaint involves an extensive differential diagnosis and also carries with it a high risk of complications and morbidity.  Serious etiology was considered. Ddx includes but is not limited to: Differential diagnosis includes but is not exclusive to ectopic pregnancy, ovarian cyst, ovarian torsion, acute  appendicitis, urinary tract infection, endometriosis, bowel obstruction, hernia, colitis, renal colic, gastroenteritis, volvulus etc.   Complete initial physical exam performed, notably the patient  was no acute distress, resting comfortably, abdomen mildly distended, not peritoneal.    Reviewed and confirmed nursing documentation for past medical history, family history, social history.  Vital signs reviewed.    Clinical Course as of 12/18/22 0458  Thu Dec 18, 2022  0354 UA w/o infection, Cr stable, cbc stable. CT and CXR were stable as well. She is voiding spontaneously after IVF, voided around 600 ml [SG]  0454 She is feeling much better, labs and imaging stable.  Voiding spontaneously, tolerating p.o. without difficulty.  Symptoms have resolved, feeling back to baseline. [SG]    Clinical Course User Index [SG] Sloan Leiter, DO   Labs and imaging are stable.  Recheck she is feeling back to normal, she is voiding spontaneously.  No dysuria or hematuria.  Urinalysis without acute infection.  CT imaging without evidence of obstruction.  Unclear etiology or complaints today.  Multiple abdominal/pelvic bladder surgeries in the past, [facility].  Recommend close outpatient follow-up with [facility].  He has sulfa allergy  The patient improved significantly and was discharged in stable condition. Detailed discussions were had with the patient regarding current findings, and need  for close f/u with PCP or on call doctor. The patient has been instructed to return immediately if the symptoms worsen in any way for re-evaluation. Patient verbalized understanding and is in agreement with current care plan. All questions answered prior to discharge.       Additional history obtained: -Additional history obtained from spouse -External records from outside source obtained and reviewed including: Chart review including previous notes, labs, imaging, consultation notes including primary care  documentation, prior ED visit, prior labs and imaging,   Lab Tests: -I ordered, reviewed, and interpreted labs.   The pertinent results include:   Labs Reviewed  COMPREHENSIVE METABOLIC PANEL - Abnormal; Notable for the following components:      Result Value   Sodium 133 (*)    Potassium 3.4 (*)    Glucose, Bld 105 (*)    Calcium 8.7 (*)    All other components within normal limits  URINALYSIS, ROUTINE W REFLEX MICROSCOPIC - Abnormal; Notable for the following components:   Color, Urine COLORLESS (*)    Specific Gravity, Urine 1.002 (*)    Hgb urine dipstick SMALL (*)    Leukocytes,Ua TRACE (*)    All other components within normal limits  CBC WITH DIFFERENTIAL/PLATELET  BRAIN NATRIURETIC PEPTIDE  LIPASE, BLOOD    Notable for stable labs  EKG   EKG Interpretation  Date/Time:    Ventricular Rate:    PR Interval:    QRS Duration:   QT Interval:    QTC Calculation:   R Axis:     Text Interpretation:           Imaging Studies ordered: I ordered imaging studies including CTAP I independently visualized the following imaging with scope of interpretation limited to determining acute life threatening conditions related to emergency care; findings noted above, significant for diverticulosis, stable adrenal lesion, imaging stable I independently visualized and interpreted imaging. I agree with the radiologist interpretation   Medicines ordered and prescription drug management: Meds ordered this encounter  Medications   sodium chloride 0.9 % bolus 1,000 mL   iohexol (OMNIPAQUE) 300 MG/ML solution 100 mL    -I have reviewed the patients home medicines and have made adjustments as needed   Consultations Obtained: na   Cardiac Monitoring: The patient was maintained on a cardiac monitor.  I personally viewed and interpreted the cardiac monitored which showed an underlying rhythm of: NSR  Social Determinants of Health:  Diagnosis or treatment significantly limited  by social determinants of health: former smoker   Reevaluation: After the interventions noted above, I reevaluated the patient and found that they have resolved  Co morbidities that complicate the patient evaluation  Past Medical History:  Diagnosis Date   Adrenal adenoma 05/22/2014   Adrenal gland cyst (HCC) 05/22/2014   Allergic state 12/31/2012   Anxiety    Asthma    triggerred by fragrances    Barrett esophagus    Blind loop syndrome    C. difficile diarrhea    Cancer (HCC)    pt denies    Chest pain, atypical 07/14/2011   Cold sore 07/17/2016   Colon polyp    Contact dermatitis 03/23/2012   Cystocele, midline    Depression    Diarrhea    Diverticulosis    Endometrial hyperplasia 02/27/2006   BENIGN ENDO BX ON 02/2007   GERD (gastroesophageal reflux disease)    Headache 04/16/2015   Headache    Headache(784.0) 04/15/2012   pt denies    Heart murmur  Hematuria 04/27/2012   Hepatic cyst    Hiatal hernia    Hiatal hernia    Hyperlipidemia, mixed 12/17/2014   Hypertension    IBS (irritable bowel syndrome)    Insomnia    Leaky heart valve    Low back pain 08/15/2013   Osteoporosis 03/2017   T score -2.5. 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    Final Clinical Impression(s) / ED Diagnoses Final diagnoses:  Urinary retention  Adrenal nodule (HCC)  Diverticulosis     This chart was dictated using voice recognition software.  Despite best efforts to proofread,  errors can occur which can change the documentation meaning.    Sloan Leiter, DO 12/18/22 [phone]

## 2022-12-19 ENCOUNTER — Ambulatory Visit: Payer: Self-pay

## 2022-12-19 ENCOUNTER — Telehealth: Payer: Self-pay

## 2022-12-19 NOTE — Transitions of Care (Post Inpatient/ED Visit) (Unsigned)
12/19/2022  Name: Megan [name] MRN: [id number] DOB: 1951-06-02  Today's TOC FU Call Status: Today's TOC FU Call Status:: Successful TOC FU Call Competed TOC FU Call Complete Date: 12/19/22  Transition Care Management Follow-up Telephone Call Date of Discharge: 12/18/22 Discharge Facility: [facility]) Type of Discharge: Emergency Department How have you been since you were released from the hospital?: Same Any questions or concerns?: No  Items Reviewed: Did you receive and understand the discharge instructions provided?: Yes Medications obtained,verified, and reconciled?: No Any new allergies since your discharge?: No Dietary orders reviewed?: Yes Do you have support at home?: Yes  Medications Reviewed Today: Medications Reviewed Today     Reviewed by Bennie Pierini, FNP (Family Nurse Practitioner) on 11/26/22 at 1409  Med List Status: <None>   Medication Order Taking? Sig Documenting Provider Last Dose Status Informant  albuterol (VENTOLIN HFA) 108 (90 Base) MCG/ACT inhaler [id number]  TAKE 2 PUFFS BY MOUTH EVERY 6 HOURS AS NEEDED FOR WHEEZE OR SHORTNESS OF BREATH Loyola Mast, MD  Active   ALPRAZolam Prudy Feeler) 1 MG tablet [id number] No TAKE 1 TABLET BY MOUTH TWICE A DAY AS NEEDED Loyola Mast, MD Taking Active   Alum & Mag Hydroxide-Simeth (MYLANTA PO) [id number] No Take by mouth. PRN [provider] Taking Active   Alum Hydroxide-Mag Carbonate (GAVISCON PO) [id number] No Take by mouth. PRN [provider] Taking Active   aspirin EC 81 MG tablet [id number] No Take 81 mg by mouth daily. Swallow whole. [provider] Taking Active   Biotin 10 MG TABS [id number] No Take by mouth. [provider] Taking Active   cholecalciferol (VITAMIN D3) 25 MCG (1000 UNIT) tablet [id number] No Take 5,000 Units by mouth daily. [provider] Taking Active   dicyclomine (BENTYL) 10 MG capsule [id number] No  [provider] Taking  Active   diltiazem (CARDIZEM CD) 240 MG 24 hr capsule [id number] No Take 1 capsule (240 mg total) by mouth daily. Tonny Bollman, MD Taking Active   DULoxetine (CYMBALTA) 30 MG capsule [id number]  TAKE 1 CAPSULE BY MOUTH EVERY DAY Glean Salvo, NP  Active   estradiol (ESTRACE) 0.1 MG/GM vaginal cream [id number] No Place 0.5 g vaginally 2 (two) times a week. Place 0.5g nightly for two weeks then twice a week after  Patient not taking: Reported on 11/11/2022   Marguerita Beards, MD Not Taking Active   Evolocumab [facility] SURECLICK) 140 MG/ML Ivory Broad [id number] No Inject 140 mg into the skin every 14 (fourteen) days. Cheree Ditto, [facility] Taking Active   famotidine-calcium carbonate-magnesium hydroxide (PEPCID COMPLETE) 10-800-165 MG chewable tablet [id number] No Chew 1 tablet by mouth daily as needed. [provider] Taking Active   fluticasone (FLONASE) 50 MCG/ACT nasal spray [id number] No SPRAY 2 SPRAYS INTO EACH NOSTRIL EVERY DAY Loyola Mast, MD Taking Active   gabapentin (NEURONTIN) 300 MG capsule [id number] No Take 1 capsule twice daily Glean Salvo, NP Taking Active   hydrocortisone (ANUSOL-HC) 2.5 % rectal cream [id number] No Place 1 Application rectally 2 (two) times daily. Waldon Merl, PA-C Taking Active   metoprolol succinate (TOPROL-XL) 50 MG 24 hr tablet [id number] No Take 1 tablet (50 mg total) by mouth daily. Take with or immediately following a meal. Tonny Bollman, MD Taking Active   Multiple Vitamin (MULTIVITAMIN) tablet [id number] No Take 1 tablet by mouth daily. [provider] Taking Active   ondansetron (ZOFRAN) 4 MG tablet [id number] No TAKE 1 TABLET BY MOUTH EVERY 8  HOURS AS NEEDED FOR NAUSEA AND VOMITING Loyola Mast, MD Taking Active   promethazine (PHENERGAN) 25 MG tablet [id number] No Take 1 tablet (25 mg total) by mouth every 6 (six) hours as needed for nausea or vomiting. Loyola Mast, MD Taking Active   sucralfate (CARAFATE) 1 GM/10ML  suspension [id number] No Take 10 mLs (1 g total) by mouth 4 (four) times daily -  with meals and at bedtime. Arby Barrette, MD Taking Active   tiZANidine (ZANAFLEX) 4 MG tablet [id number] No Take 1 tablet (4 mg total) by mouth every 8 (eight) hours as needed for muscle spasms. Glean Salvo, NP Taking Active   Vitamin E (VITAMIN E/D-ALPHA NATURAL) 268 MG (400 UNIT) CAPS [id number] No Take by mouth. [provider] Taking Active             Home Care and Equipment/Supplies: Were Home Health Services Ordered?: NA Any new equipment or medical supplies ordered?: NA  Functional Questionnaire: Do you need assistance with bathing/showering or dressing?: No Do you need assistance with meal preparation?: No Do you need assistance with eating?: No Do you have difficulty maintaining continence: No Do you need assistance with getting out of bed/getting out of a chair/moving?: No Do you have difficulty managing or taking your medications?: No  Follow up appointments reviewed: PCP Follow-up appointment confirmed?: Yes Date of PCP follow-up appointment?: 12/30/22 Follow-up Provider: Dr. Veto Kemps Specialist [facility] Follow-up appointment confirmed?: NA Do you need transportation to your follow-up appointment?: No Do you understand care options if your condition(s) worsen?: Yes-patient verbalized understanding    SIGNATURE Arvil Persons, BSN, RN

## 2022-12-19 NOTE — Chronic Care Management (AMB) (Signed)
   12/19/2022  Megan [name] April 14, 1951 [id number]   Reason for Encounter: Patient is not currently enrolled in the CCM program. CCM status changed to previously enrolled  Alto Denver RN, MSN, CCM RN Care Manager  Chronic Care Management Direct Number: [phone]

## 2022-12-25 ENCOUNTER — Telehealth: Payer: Self-pay | Admitting: Cardiovascular Disease

## 2022-12-25 ENCOUNTER — Encounter: Payer: Self-pay | Admitting: Physician Assistant

## 2022-12-25 NOTE — Telephone Encounter (Signed)
Oh sorry, initial message said she was asymptomatic. Only taking diltiazem and metoprolol, neither of which I recommend stopping abruptly. Recommend she contact PCP or go to urgent care

## 2022-12-25 NOTE — Telephone Encounter (Signed)
Error

## 2022-12-25 NOTE — Telephone Encounter (Signed)
Pt husband Fayrene Fearing ( on [place]) called reporting pt blood pressure is hypotensive.  Pt is complain of getters, lightheadedness, and headaches. Patient was seen in the ED on 5/2 for urinary retention she is scheduled for an f/u appointment with PCP on 5/14. Will forward to hypertension clinic for advise.  BP READINGS  135/57 HR 74 ( this morning) 131/71  HR 71 (Yesterday morning) 153/67  HR 62 (Yesterday afternoon)

## 2022-12-25 NOTE — Telephone Encounter (Signed)
.  Pt c/o BP issue: STAT if pt c/o blurred vision, one-sided weakness or slurred speech  1. What are your last 5 BP readings?  118/57   2. Are you having any other symptoms (ex. Dizziness, headache, blurred vision, passed out)? No   3. What is your BP issue? Pt spouse states  pt's bottom number has been low around 57-60 lately. He denies she has any other symptoms right now. He isn't sure what she should do in the meantime of her next appt in July

## 2022-12-25 NOTE — Telephone Encounter (Signed)
If patient is asymptomatic, recommend continuing to monitor and bring readings to PCP appointment next week

## 2022-12-25 NOTE — Telephone Encounter (Signed)
Spoke with the patient, explained PharmD recommendations: she contact PCP or go to urgent care. Patient voiced understanding and  she has a scheduled appointment with her PCP on 5/14. Will forward to MD and nurse.

## 2022-12-29 ENCOUNTER — Other Ambulatory Visit: Payer: Self-pay | Admitting: Physician Assistant

## 2022-12-29 DIAGNOSIS — J449 Chronic obstructive pulmonary disease, unspecified: Secondary | ICD-10-CM | POA: Diagnosis not present

## 2022-12-29 DIAGNOSIS — H6992 Unspecified Eustachian tube disorder, left ear: Secondary | ICD-10-CM | POA: Diagnosis not present

## 2022-12-29 DIAGNOSIS — H9202 Otalgia, left ear: Secondary | ICD-10-CM

## 2022-12-29 DIAGNOSIS — K5190 Ulcerative colitis, unspecified, without complications: Secondary | ICD-10-CM | POA: Diagnosis not present

## 2022-12-29 DIAGNOSIS — H6121 Impacted cerumen, right ear: Secondary | ICD-10-CM | POA: Diagnosis not present

## 2022-12-29 DIAGNOSIS — M5030 Other cervical disc degeneration, unspecified cervical region: Secondary | ICD-10-CM | POA: Diagnosis not present

## 2022-12-30 ENCOUNTER — Ambulatory Visit (INDEPENDENT_AMBULATORY_CARE_PROVIDER_SITE_OTHER): Payer: Medicare HMO | Admitting: Family Medicine

## 2022-12-30 ENCOUNTER — Encounter: Payer: Self-pay | Admitting: Family Medicine

## 2022-12-30 VITALS — BP 136/68 | HR 74 | Temp 97.6°F | Ht 66.0 in | Wt 145.8 lb

## 2022-12-30 DIAGNOSIS — T7491XA Unspecified adult maltreatment, confirmed, initial encounter: Secondary | ICD-10-CM | POA: Insufficient documentation

## 2022-12-30 DIAGNOSIS — F418 Other specified anxiety disorders: Secondary | ICD-10-CM

## 2022-12-30 DIAGNOSIS — I10 Essential (primary) hypertension: Secondary | ICD-10-CM | POA: Diagnosis not present

## 2022-12-30 DIAGNOSIS — R339 Retention of urine, unspecified: Secondary | ICD-10-CM

## 2022-12-30 DIAGNOSIS — T7491XD Unspecified adult maltreatment, confirmed, subsequent encounter: Secondary | ICD-10-CM | POA: Diagnosis not present

## 2022-12-30 MED ORDER — ALPRAZOLAM 1 MG PO TABS
1.0000 mg | ORAL_TABLET | Freq: Two times a day (BID) | ORAL | 1 refills | Status: DC | PRN
Start: 2022-12-30 — End: 2023-03-03

## 2022-12-30 NOTE — Assessment & Plan Note (Signed)
Currently on doxepin. I will increase her alprazolam quantity to #45 per month. This will allow her to continue her hs dose, but have a rescue dose for anxiety attacks. We discussed the importance of regular exercise to help her deal with her mood issues.

## 2022-12-30 NOTE — Assessment & Plan Note (Signed)
Megan [name] is continuing to have some intermittent urinary retention. She denies use of OTC antihistamines. I encouraged her to follow-up with urology, as recommended by the ED physician.

## 2022-12-30 NOTE — Assessment & Plan Note (Addendum)
Blood pressure is adequately controlled. Continue metoprolol succinate 40 mg daily, diltiazem 240 mg daily and HCTZ 25 mg daily.Marland Kitchen
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
## 2022-12-31 NOTE — Progress Notes (Signed)
[facility] Return Visit  SUBJECTIVE  History of Present Illness: Megan [name] is a 72 y.o. female seen in follow-up for recent ER visit regarding acute urinary retention. S/p robotic supracervical hysterectomy, BSO, sacrocolpopexy in March 2022 with Dr Marlou Porch (Urology).   Drinking 5 bottles (16oz) of water daily. She is under a lot of stress as she is the primary caregiver for her husband.   She reports she is able to urinate at night but during the day she feels she is not emptying well. In the evening she takes Xanax 0.5mg  and then she is up every 2-4 hours to urinate.   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 difficile diarrhea, Cancer (HCC), Chest pain, atypical (07/14/2011), Cold sore (07/17/2016), Colon polyp, Contact dermatitis (03/23/2012), Cystocele, midline, Depression, Diarrhea, Diverticulosis, Endometrial hyperplasia (02/27/2006), GERD (gastroesophageal reflux disease), Headache (04/16/2015), Headache, Headache(784.0) (04/15/2012), Heart murmur, Hematuria (04/27/2012), Hepatic cyst, Hiatal hernia, Hiatal hernia, Hyperlipidemia, mixed (12/17/2014), Hypertension, IBS (irritable bowel syndrome), Insomnia, Leaky heart valve, Low back pain (08/15/2013), Osteoporosis (03/2017), Pancreatic cyst (04/19/2013), Paronychia of great toe, left (06/19/2013), Preventative health care (09/11/2014), Rectal bleeding (10/10/2011), Rectocele, 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   Past Surgical History: She   has a past surgical history that includes Appendectomy; Uterine fibroid surgery; Hysteroscopy; Cardiac catheterization (Left, 07/24/2016); Upper gi endoscopy; endoscopic hemoclip; eye surgery (Left); Open reduction internal fixation (orif) distal radial fracture (Left, 01/17/2019); Robotic assisted laparoscopic sacrocolpopexy (Bilateral, 11/15/2020); Robotic assisted bilateral salpingo oophorectomy (11/15/2020); Robotic assisted supracervical hysterectomy (11/15/2020); and RIGHT HEART CATH (N/A, 01/01/2022).   Medications: She has a current medication list which includes the following prescription(s): albuterol, alprazolam, calcium & magnesium carbonates, alum hydroxide-mag carbonate, aspirin ec, biotin, cholecalciferol, cyclobenzaprine, dicyclomine, diltiazem, doxepin, estradiol, repatha sureclick, famotidine-calcium carbonate-magnesium hydroxide, fluticasone, gabapentin, hydrochlorothiazide, metoprolol succinate, multivitamin, ondansetron, promethazine, tenapanor hcl, tizanidine, trazodone, and vitamin e.   Allergies: Patient is allergic to sulfamethoxazole, amitriptyline, cymbalta [duloxetine hcl], lactose intolerance (gi), lexapro [escitalopram oxalate], topamax [topiramate], and prednisone.   Social History: Patient  reports that she quit smoking about 27 years ago. Her smoking use included cigarettes. She has never used smokeless tobacco. She reports that she does not drink alcohol and does not use drugs.      OBJECTIVE   PVR: 2ml  POC Urine: Negative for all components   CT Scan 12/18/22:  1. No acute intra-abdominal pathology identified. No definite radiographic explanation for the patient's reported symptoms. 2. Mild hepatic steatosis. 3. Moderate sigmoid diverticulosis without superimposed acute inflammatory change. 4. 14 mm left adrenal nodule, stable since remote prior examination of 02/11/2011. While indeterminate, its stability over time is in keeping with a benign lesion, likely an  adrenal adenoma, and further follow-up imaging is not required.  Physical Exam: Vitals:   01/01/23 0840 01/01/23 0923  BP: (!) 153/70 (!) 146/79  Pulse: 71 68   Gen: No apparent distress, A&O x 3.  Detailed Urogynecologic Evaluation:  No bleeding, masses or cervical abnormalities. Noted obturator tenderness on the left side with levator spasm. No sign of mesh erosion or urethral diverticulum.     ASSESSMENT AND PLAN    Megan [name] is a 72 y.o. with:  1. Urinary retention  2. Levator spasm    Patient has a large amount of caregiver stress and fatigue. She reports she peed this morning at 0700 and has not urinated again since. A catheterized urine was negative today for any sign of infection and there is no need for culture. PVR today 2ml by bladder scan. We also discussed drinking her fluid intentionally 4-8oz at a time and not sipping, as well as stopping drinking around 7pm at night with her bedtime being 10pm.  Will start patient on Flexeril 5mg  PRN up to 3 times daily for levator spasm. She has significant anxiety and depression which can be contributory to her pelvic floor tension. She reports she is unable to go to pelvic floor PT due to her caregiver obligations to her husband. If she notices that the muscle relaxants are not helping we will plan do to urodynamics to evaluate bladder emptying. She is also planning to follow up with cardiology and neurology about her continued issues.   Patient to follow up in 6 weeks, but was instructed to call the office if she is not improving so we can schedule urodynamic testing. She is aware and repeated back she would call if she is not improving.

## 2023-01-01 ENCOUNTER — Ambulatory Visit: Payer: Medicare HMO | Admitting: Neurology

## 2023-01-01 ENCOUNTER — Encounter: Payer: Self-pay | Admitting: Neurology

## 2023-01-01 ENCOUNTER — Encounter: Payer: Self-pay | Admitting: Obstetrics and Gynecology

## 2023-01-01 ENCOUNTER — Ambulatory Visit: Payer: Medicare HMO | Admitting: Obstetrics and Gynecology

## 2023-01-01 ENCOUNTER — Telehealth: Payer: Self-pay | Admitting: Pharmacy Technician

## 2023-01-01 VITALS — BP 127/66 | HR 79 | Ht 66.0 in | Wt 144.5 lb

## 2023-01-01 VITALS — BP 146/79 | HR 68

## 2023-01-01 DIAGNOSIS — R519 Headache, unspecified: Secondary | ICD-10-CM

## 2023-01-01 DIAGNOSIS — F418 Other specified anxiety disorders: Secondary | ICD-10-CM

## 2023-01-01 DIAGNOSIS — G8929 Other chronic pain: Secondary | ICD-10-CM | POA: Diagnosis not present

## 2023-01-01 DIAGNOSIS — G3184 Mild cognitive impairment, so stated: Secondary | ICD-10-CM

## 2023-01-01 DIAGNOSIS — R339 Retention of urine, unspecified: Secondary | ICD-10-CM | POA: Diagnosis not present

## 2023-01-01 DIAGNOSIS — M62838 Other muscle spasm: Secondary | ICD-10-CM | POA: Diagnosis not present

## 2023-01-01 DIAGNOSIS — R197 Diarrhea, unspecified: Secondary | ICD-10-CM | POA: Diagnosis not present

## 2023-01-01 LAB — POCT URINALYSIS DIPSTICK
Bilirubin, UA: NEGATIVE
Blood, UA: NEGATIVE
Glucose, UA: NEGATIVE
Ketones, UA: NEGATIVE
Leukocytes, UA: NEGATIVE
Nitrite, UA: NEGATIVE
Protein, UA: NEGATIVE
Spec Grav, UA: 1.025 (ref 1.010–1.025)
Urobilinogen, UA: 0.2 E.U./dL
pH, UA: 7 (ref 5.0–8.0)

## 2023-01-01 MED ORDER — DULOXETINE HCL 20 MG PO CPEP: 20.0000 mg | ORAL_CAPSULE | Freq: Every day | ORAL | 5 refills | Status: DC

## 2023-01-01 MED ORDER — CYCLOBENZAPRINE HCL 5 MG PO TABS
5.0000 mg | ORAL_TABLET | Freq: Three times a day (TID) | ORAL | 1 refills | Status: DC | PRN
Start: 2023-01-01 — End: 2024-01-14

## 2023-01-01 MED ORDER — UBRELVY 100 MG PO TABS: 100.0000 mg | ORAL_TABLET | ORAL | 11 refills | Status: DC | PRN

## 2023-01-01 MED ORDER — GABAPENTIN 300 MG PO CAPS: ORAL_CAPSULE | ORAL | 5 refills | Status: DC

## 2023-01-01 NOTE — Patient Instructions (Addendum)
Stop drinking around 7pm at night. Drink about 8 oz at a time (Half your water bottle) and try not to sip.   I will have you follow up in 6 weeks and if you are doing well we will keep that appointment. If you are not emptying well I would like you to call and get set up for bladder testing (Urodynamics)

## 2023-01-01 NOTE — Telephone Encounter (Signed)
Patient Advocate Encounter   Received notification that prior authorization for Ubrelvy 100MG  tablets is required.   PA submitted on 01/01/2023 [id number] Electronic PA Form Status is pending

## 2023-01-01 NOTE — Patient Instructions (Addendum)
Try the Ubrelvy at onset of headache We can retry the Cymbalta, but lower dose 20 mg to see if benefit your mood and your headaches  Can use Flexeril for headaches as well  See you back in 6 months

## 2023-01-01 NOTE — Telephone Encounter (Signed)
Patient Advocate Encounter  Prior Authorization for Megan [name] 100MG  tablets  has been approved.    PA# [id number] Insurance [employer] Electronic PA Form  Effective dates: 01/01/2023 through 08/18/2023

## 2023-01-01 NOTE — Progress Notes (Signed)
Patient: Megan [name] Date of Birth: 03/28/1951  Reason for Visit: Follow up History from: Patient Primary Neurologist: Terrace [facility]   ASSESSMENT AND PLAN 72 y.o. year old female   1.  Mild cognitive impairment -Stable, MoCA 21/30, denies any changes -B12, TSH, RPR were unremarkable -MRI of the brain in December 2022 showed mild generalized atrophy, small vessel disease -Per father passed of Parkinson's disease at 75 -Most likely early central nervous system degenerative disorder impacted by lifelong history of mood disorder  2.  Left-sided headaches 3.  Mood disorder -Are actually improved, on average 1 every 2 weeks -Continue gabapentin up to 300 mg twice daily, just prescribed Flexeril to take as needed -Restart Cymbalta only 20 mg daily, would help her mood, neuromuscular/nerve type neck, head pain.  We restarted in March 30 mg, had constipation, but now on new constipation medication, wants to try again -Try Ubrelvy 100 mg at onset of headache for acute treatment, currently Tylenol is not helping; headaches with some migraine features -Has been referred to neurosurgery, MRI cervical spine has shown disc degeneration and spondylosis at C4-5 and C5-6.  Mild spinal stenosis and foraminal stenosis bilaterally at both levels.  See if potential candidate for injection? -Historically, normal ESR, CRP -Previously tried and failed Topamax, Cymbalta -Next steps: Potential trial of CGRP, encouraged to follow-up with neurosurgery for their consultation -Follow-up with me in 6 months or sooner if needed  Meds ordered this encounter  Medications   Ubrogepant (UBRELVY) 100 MG TABS    Sig: Take 1 tablet (100 mg total) by mouth as needed (Take 1 tablet at onset of headache, may repeat in 2 hours if needed, max is 200 mg in 24 hours).    Dispense:  12 tablet    Refill:  11   gabapentin (NEURONTIN) 300 MG capsule    Sig: Take 1 capsule twice daily    Dispense:  60 capsule    Refill:  5    DULoxetine (CYMBALTA) 20 MG capsule    Sig: Take 1 capsule (20 mg total) by mouth daily.    Dispense:  30 capsule    Refill:  5   HISTORY  Megan [name] is a 73 years old female,  seen in request by her primary care physician PA Waldon Merl, [place] and ophthalmologist Dr. Sallye Lat for evaluation of headaches, initial evaluation was on April 07, 2018.   She has past medical history of depression anxiety, had intolerance to multiple different agents in the past, including amitriptyline, Effexor, but she denied previous history of headaches, early 2019, she began to have frequent left retro-orbital area pressure headaches, initially thought it was due to sinus issues, was evaluated by ENT Dr. Lazarus Salines, reported normal CT sinus in 2019.   She had left cataract surgery by Dr. Dione Booze in April 2019, left vision overall recovered very well, but since her cataract surgery in April, left-sided headache has become worse, constant pressure, 5 out of 10 on a daily basis, often exacerbated to a more severe pounding headache with associated light noise sensitivity, nauseous, she tends to lying in bed for hours to help her headaches, she has tried over-the-counter ibuprofen and Tylenol with limited help,   We personally reviewed MRI of the brain in May 2017 for evaluation of acute onset right facial droop and numbness, there was no acute abnormality mild supratentorium small vessel disease,    ESR C-reactive protein were normal in April 2019.   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   Update February 07, 2021 SS: Last seen Dec 2021, started Effexor-XR 37.5 mg, gabapentin 100 mg TID for migraine prevention, Fioricet PRN. CRP, sed rate, ANA were normal. Claims couldn't take Effexor, tried for 3 weeks, felt nauseated, palpations. Taking gabapentin 200 mg 2-3 times a day, does help. Reports daily ache to left parietal area, on good days can be dull, bad days is throbbing. When turns head to right sounds to her like click to left parietal area, not audible to anyone else. With headaches has nausea. At times is overwhelming to her, feels like bad earache but in her head. Has IBS, colitis. Remains on aspirin 81 mg daily. Ice and heat help the parietal area. Claims doesn't do well with antidepressants. Admits to anxiety, is the go getter in her family. Didn't take Fioricet.    Update August 08, 2021 SS: headaches still on left temple/parietal area, everyday, always dull but sometimes sharp pains throbbing. Gets nauseated. 4/7 days are bad headaches. Taking gabapentin 100 mg 3 times daily, the 300 mg twice daily gave her diarrhea with colitis. Claims blurry vision to the left eye, always, blurry vision for last 2 months. The eye doctor has cleared her.  The worse the headache gets the worse it gets. Takes Tylenol without benefit. Cymbalta didn't agree with her, restarted it again 2 weeks ago to try again, has taken edge off stress. Fioricet made her heart race. A lot of stress with her and her husband, she worries a lot about her family. On aspirin.   UPDATE Dec 18 2021: She continued complaints of significant left-sided headache, tightness sensation,  difficulty turning, difficulty lying on her left side, worst at nighttime, she has tried Fioricet, Effexor, Cymbalta, only effective, could not tolerate due to side effect  She is taking gabapentin 300 mg twice daily, last dose was 5 PM, was not sure about the benefit, not a good candidate for NSAIDs due to acid reflux, heating pad always helps,   She also complains of lifelong history of anxiety, is a Product/process development scientist, noticed gradual onset of memory loss, slow reaction time, misplace things, MoCA examination 21/30,   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 Feels headaches are improving, left sided, mostly to the neck area, heating pad helps. Takes gabapentin 300 mg at bedtime if needed, has tizanidine if needed, rarely if neck tension. Doesn't have daily headaches, but has  the neck pain. Is feeling harder of hearing. MOCA 21/30 today. Get words mixed up, poor spelling, will catch herself. B 12 > 2000, TSH 1.690, RPR negative.  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  Update Jan 01, 2023 SS: MOCA 21/30. Continues with left sided headaches, are better, happens once every 2 weeks, can last a few days. Has had left shoulder pain, reports referred to neurosurgery, ENT ordered CT soft tissue neck. Takes gabapentin 300 mg only once a day. Feels memory is doing okay, its worsens when she gets frustrated. Has been placed on Flexeril today, up to 3 times a day. Tried tizanidine , interacted with her BP med. She tried Cymbalta again in March, it caused constipation, she stopped it.   Stress with her husband.  She looks great today.  With left-sided headaches, is a pressure, throbbing, feels nauseated.  October 2023 MRI cervical spine  IMPRESSION: Disc degeneration and spondylosis at C4-5 and C5-6. Mild spinal stenosis and foraminal stenosis bilaterally at both levels.   REVIEW OF SYSTEMS: Out of a complete 14 system review of symptoms, the patient complains only of the following symptoms, and all other reviewed systems are negative.  See HPI  ALLERGIES: Allergies  Allergen Reactions   Sulfamethoxazole Shortness Of Breath    chest tightness   Amitriptyline Anxiety and Other (See Comments)    Depression, insomnia   Cymbalta [Duloxetine Hcl] Other (See Comments)    Insomnia, constipation   Lactose Intolerance (Gi) Diarrhea and Nausea And Vomiting   Lexapro [Escitalopram Oxalate]     nausea   Topamax [Topiramate] Itching   Prednisone Palpitations    HOME MEDICATIONS: Outpatient Medications Prior to Visit  Medication Sig Dispense Refill   albuterol (VENTOLIN HFA) 108 (90 Base) MCG/ACT inhaler TAKE 2 PUFFS BY MOUTH EVERY 6 HOURS AS NEEDED FOR WHEEZE OR SHORTNESS OF BREATH 8.5 each 1   ALPRAZolam (XANAX) 1 MG tablet Take 1 tablet (1 mg total) by mouth 2 (two) times daily as needed. 45 tablet 1   Alum & Mag Hydroxide-Simeth (MYLANTA PO) Take by mouth. PRN     Alum Hydroxide-Mag Carbonate (GAVISCON PO) Take by mouth. PRN     aspirin EC 81 MG tablet Take 81 mg by mouth daily. Swallow whole.     Biotin 10 MG TABS Take by mouth.     cholecalciferol (VITAMIN D3) 25 MCG (1000 UNIT) tablet Take 5,000 Units by mouth daily.     cyclobenzaprine (FLEXERIL) 5 MG tablet Take 1 tablet (5 mg total) by mouth 3 (three) times daily as needed for muscle spasms. 60 tablet 1   dicyclomine (BENTYL) 10 MG capsule      diltiazem (CARDIZEM CD) 240 MG 24 hr capsule Take 1 capsule (240 mg total) by mouth daily. 90 capsule 3   estradiol (ESTRACE) 0.1 MG/GM vaginal cream Place 0.5 g vaginally  2 (two) times a week. Place 0.5g nightly for two weeks then twice a week after 30 g 11   Evolocumab (REPATHA SURECLICK) 140 MG/ML SOAJ Inject 140 mg into the skin every 14 (fourteen) days. 6 mL 3   famotidine-calcium carbonate-magnesium hydroxide (PEPCID COMPLETE) 10-800-165 MG chewable tablet Chew 1 tablet by mouth daily as needed.     fluticasone (FLONASE) 50 MCG/ACT nasal spray SPRAY 2 SPRAYS INTO EACH NOSTRIL EVERY DAY 48 mL 6   gabapentin (NEURONTIN) 300 MG capsule Take 1 capsule twice daily 60 capsule 5  hydrochlorothiazide (HYDRODIURIL) 25 MG tablet Take 25 mg by mouth daily.     metoprolol succinate (TOPROL-XL) 50 MG 24 hr tablet Take 1 tablet (50 mg total) by mouth daily. Take with or immediately following a meal. 90 tablet 3   Multiple Vitamin (MULTIVITAMIN) tablet Take 1 tablet by mouth daily.     ondansetron (ZOFRAN) 4 MG tablet TAKE 1 TABLET BY MOUTH EVERY 8 HOURS AS NEEDED FOR NAUSEA AND VOMITING 20 tablet 3   promethazine (PHENERGAN) 25 MG tablet TAKE 1 TABLET BY MOUTH EVERY 6 HOURS AS NEEDED FOR NAUSEA OR VOMITING. 30 tablet 2   Tenapanor HCl 50 MG TABS Take 50 mg by mouth 2 (two) times daily.     tiZANidine (ZANAFLEX) 4 MG tablet Take 1 tablet (4 mg total) by mouth every 8 (eight) hours as needed for muscle spasms. 30 tablet 2   traZODone (DESYREL) 50 MG tablet Take 25-50 mg by mouth at bedtime as needed.     Vitamin E (VITAMIN E/D-ALPHA NATURAL) 268 MG (400 UNIT) CAPS Take by mouth.     doxepin (SINEQUAN) 25 MG capsule Take 25 mg by mouth at bedtime.     No facility-administered medications prior to visit.    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 difficile diarrhea    Cancer (HCC)    pt denies    Chest pain, atypical 07/14/2011   Cold sore 07/17/2016    Colon polyp    Contact dermatitis 03/23/2012   Cystocele, midline    Depression    Diarrhea    Diverticulosis    Endometrial hyperplasia 02/27/2006   BENIGN ENDO BX ON 02/2007   GERD (gastroesophageal reflux disease)    Headache 04/16/2015   Headache    Headache(784.0) 04/15/2012   pt denies    Heart murmur    Hematuria 04/27/2012   Hepatic cyst    Hiatal hernia    Hiatal hernia    Hyperlipidemia, mixed 12/17/2014   Hypertension    IBS (irritable bowel syndrome)    Insomnia    Leaky heart valve    Low back pain 08/15/2013   Osteoporosis 03/2017   T score -2.5. 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   Sinusitis acute 02/03/2012   Thrush 07/21/2011   Tricuspid regurgitation, Mild-Moderate 07/21/2011   Moderate by 2 d echocardiogram Echo 11/2021: EF 60-65, no RWMA, normal RVSF, moderately elevated PASP, RVSP 52.1, trivial MR, mild to moderate TR   Unspecified hypothyroidism    Unspecified menopausal and postmenopausal disorder    Uterine prolapse without mention of vaginal wall prolapse    Vitamin B12 deficiency    Vitamin D deficiency 07/17/2016    PAST SURGICAL HISTORY: Past Surgical History:  Procedure Laterality Date   APPENDECTOMY     endoscopic hemoclip     eye surgery Left    lens implant   HYSTEROSCOPY     POLYP   OPEN REDUCTION INTERNAL FIXATION (ORIF) DISTAL RADIAL FRACTURE Left 01/17/2019   Procedure: LEFT OPEN REDUCTION INTERNAL FIXATION (ORIF) DISTAL RADIUS  FRACTURE;  Surgeon: Cindee Salt, MD;  Location: [facility];  Service: Orthopedics;  Laterality: Left;  AXILLARY BLOCK   PERIPHERAL VASCULAR CATHETERIZATION Left 07/24/2016   Procedure: Renal Angiography;  Surgeon: Fransisco Hertz, MD;  Location: [facility] INVASIVE CV LAB;  Service: Cardiovascular;   Laterality: Left;   RIGHT HEART CATH N/A 01/01/2022   Procedure: RIGHT HEART CATH;  Surgeon: Tonny Bollman, MD;  Location: [facility] INVASIVE CV LAB;  Service: Cardiovascular;  Laterality: N/A;   ROBOTIC ASSISTED BILATERAL SALPINGO OOPHERECTOMY  11/15/2020   ROBOTIC ASSISTED LAPAROSCOPIC SACROCOLPOPEXY Bilateral 11/15/2020   Procedure: XI ROBOTIC ASSISTED LAPAROSCOPIC SACROCOLPOPEXY AND SUPRACERVICIAL HYSTERECTOMY BILATERAL SALPINGO-OOPHERECTOMY;  Surgeon: Crist Fat, MD;  Location: [facility];  Service: Urology;  Laterality: Bilateral;  REQUESTING 4.5 HRS   ROBOTIC ASSISTED SUPRACERVICAL HYSTERECTOMY  11/15/2020   UPPER GI ENDOSCOPY     UTERINE FIBROID SURGERY      FAMILY HISTORY: Family History  Problem Relation Age of Onset   Colon cancer Mother    Diabetes Mother    Hypertension Mother    Colon polyps Father    Parkinson's disease Father    Colon cancer Maternal Grandmother    Breast cancer Maternal Grandmother 71   Diabetes Paternal Grandmother    Breast cancer Paternal Grandmother 82   Allergies Sister    Arthritis Brother        b/l hip replace   Alcohol abuse Daughter    Arthritis Son    Hypertension Son    Allergies Son    Osteoporosis Son    Osteoporosis Son    Arthritis Son        back disease, DDD   Allergies Son    Arthritis Son    Irritable bowel syndrome Son        RSD   Hypertension Paternal Grandfather    Heart disease Paternal Grandfather    Aneurysm Paternal Grandfather    Colon cancer Maternal Aunt    Colon cancer Cousin        X3   Ovarian cancer Cousin     SOCIAL HISTORY: Social History   Socioeconomic History   Marital status: Married    Spouse name: Not on file   Number of children: 4   Years of education: 12   Highest education level: 10th grade  Occupational History   Occupation: Retired  Tobacco Use   Smoking status: Former    Types: Cigarettes    Quit date: 08/19/1995    Years since quitting: 27.3   Smokeless tobacco: Never   Vaping Use   Vaping Use: Never used  Substance and Sexual Activity   Alcohol use: Never    Alcohol/week: 0.0 standard drinks of alcohol   Drug use: Never   Sexual activity: Yes    Birth control/protection: Post-menopausal    Comment: 1st intercourse 13 yo-5 partners  Other Topics Concern   Not on file  Social History Narrative   Lives at home with her husband.   Right-handed.   2 cups caffeine per day.   Social Determinants of Health   Financial Resource Strain: Low Risk  (11/11/2022)   Overall Financial Resource Strain (CARDIA)    Difficulty of Paying Living Expenses: Not very hard  Food Insecurity: No Food Insecurity (11/11/2022)   Hunger Vital Sign    Worried About Running Out of Food in the Last Year: Never true    Ran Out of Food in the Last Year: Never true  Transportation Needs: No Transportation Needs (  11/11/2022)   PRAPARE - Administrator, Civil Service (Medical): No    Lack of Transportation (Non-Medical): No  Physical Activity: Sufficiently Active (11/11/2022)   Exercise Vital Sign    Days of Exercise per Week: 4 days    Minutes of Exercise per Session: 40 min  Stress: Not on file  Social Connections: Moderately Integrated (11/11/2022)   Social Connection and Isolation Panel [NHANES]    Frequency of Communication with Friends and Family: More than three times a week    Frequency of Social Gatherings with Friends and Family: Once a week    Attends Religious Services: 1 to 4 times per year    Active Member of Clubs or Organizations: No    Attends Engineer, structural: Not on file    Marital Status: Married  Catering manager Violence: Not on file    PHYSICAL EXAM  Vitals:   01/01/23 0958  BP: 127/66  Pulse: 79  Weight: 144 lb 8 oz (65.5 kg)  Height: 5\' 6"  (1.676 m)    Body mass index is 23.32 kg/m.    01/01/2023    9:59 AM 07/02/2022    8:33 AM 12/18/2021    8:00 AM  Montreal Cognitive Assessment   Visuospatial/ Executive (0/5) 2 3  3   Naming (0/3) 3 3 3   Attention: Read list of digits (0/2) 2 2 1   Attention: Read list of letters (0/1) 0 1 1  Attention: Serial 7 subtraction starting at 100 (0/3) 0 1 1  Language: Repeat phrase (0/2) 1 2 2   Language : Fluency (0/1) 1 1 1   Abstraction (0/2) 2 2 2   Delayed Recall (0/5) 4 0 1  Orientation (0/6) 6 6 6   Total 21 21 21    Generalized: Well developed, in no acute distress  Neurological examination  Mentation: Alert oriented to time, place, history taking. Follows all commands speech and language fluent, very nice pleasant Cranial nerve II-XII: Pupils were equal round reactive to light. Extraocular movements were full, visual field were full on confrontational test. Facial sensation and strength were normal. Head turning and shoulder shrug  were normal and symmetric. Motor: The motor testing reveals 5 over 5 strength of all 4 extremities. Good symmetric motor tone is noted throughout.  Sensory: Sensory testing is intact to soft touch on all 4 extremities. No evidence of extinction is noted. Left nuchal line tenderness with palpation. Coordination: Cerebellar testing reveals good finger-nose-finger and heel-to-shin bilaterally.  Gait and station: Gait is normal.  Reflexes: Deep tendon reflexes are symmetric and normal bilaterally.   DIAGNOSTIC DATA (LABS, IMAGING, TESTING) - I reviewed patient records, labs, notes, testing and imaging myself where available.  Lab Results  Component Value Date   WBC 8.7 12/18/2022   HGB 13.6 12/18/2022   HCT 41.7 12/18/2022   MCV 95.4 12/18/2022   PLT 268 12/18/2022      Component Value Date/Time   NA 133 (L) 12/18/2022 0139   NA 140 07/07/2022 1417   K 3.4 (L) 12/18/2022 0139   CL 100 12/18/2022 0139   CO2 24 12/18/2022 0139   GLUCOSE 105 (H) 12/18/2022 0139   BUN 8 12/18/2022 0139   BUN 8 07/07/2022 1417   CREATININE 0.66 12/18/2022 0139   CREATININE 0.74 03/03/2017 1642   CALCIUM 8.7 (L) 12/18/2022 0139   CALCIUM 9.0  06/03/2011 0954   PROT 7.6 12/18/2022 0139   PROT 6.6 09/11/2022 0815   ALBUMIN 4.2 12/18/2022 0139   ALBUMIN 4.3 09/11/2022 0815  AST 22 12/18/2022 0139   ALT 18 12/18/2022 0139   ALKPHOS 90 12/18/2022 0139   BILITOT 0.5 12/18/2022 0139   BILITOT 0.3 09/11/2022 0815   GFRNONAA >60 12/18/2022 0139   GFRNONAA >89 08/01/2013 1646   GFRAA >60 03/30/2018 0953   GFRAA >89 08/01/2013 1646   Lab Results  Component Value Date   CHOL 105 09/11/2022   HDL 51 09/11/2022   LDLCALC 32 09/11/2022   LDLDIRECT 165.0 06/30/2011   TRIG 125 09/11/2022   CHOLHDL 2.1 09/11/2022   Lab Results  Component Value Date   HGBA1C 5.8 04/29/2022   Lab Results  Component Value Date   VITAMINB12 >2000 (H) 12/18/2021   Lab Results  Component Value Date   TSH 0.98 04/29/2022    Margie Ege, AGNP-C, DNP 01/01/2023, 10:11 AM [facility] [address] [place], [place] [address] [phone]

## 2023-01-02 ENCOUNTER — Encounter: Payer: Self-pay | Admitting: Neurology

## 2023-01-03 ENCOUNTER — Other Ambulatory Visit: Payer: Self-pay | Admitting: Family Medicine

## 2023-01-03 DIAGNOSIS — F5105 Insomnia due to other mental disorder: Secondary | ICD-10-CM

## 2023-01-05 NOTE — Progress Notes (Deleted)
Referring Physician:  Loyola Mast, MD [address] [place],  [place] [address]  Primary Physician:  Loyola Mast, MD  History of Present Illness: 01/05/2023*** Megan [name] has a history of HTN, pulmonary HTN, OSA, asthma, IBS, gastritis, headaches, skin CA, osteoporosis, hyperlipidemia, and history of malignant carcinoid tumor of duodenum.   She has been seeing neurology for mild cognitive impairment and headaches.      Duration: *** Location: *** Quality: *** Severity: ***  Precipitating: aggravated by *** Modifying factors: made better by *** Weakness: none Timing: *** Bowel/Bladder Dysfunction: none  Conservative measures:  Physical therapy: ***  Multimodal medical therapy including regular antiinflammatories: flexeril, cymbalta, neurontin,   Injections: *** epidural steroid injections  Past Surgery: ***  Megan [name] has ***no symptoms of cervical myelopathy.  The symptoms are causing a significant impact on the patient's life.   Review of Systems:  A 10 point review of systems is negative, except for the pertinent positives and negatives detailed in the HPI.  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 difficile diarrhea    Cancer (HCC)    pt denies    Chest pain, atypical 07/14/2011   Cold sore 07/17/2016   Colon polyp    Contact dermatitis 03/23/2012   Cystocele, midline    Depression    Diarrhea    Diverticulosis    Endometrial hyperplasia 02/27/2006   BENIGN ENDO BX ON 02/2007   GERD (gastroesophageal reflux disease)    Headache 04/16/2015   Headache    Headache(784.0) 04/15/2012   pt denies    Heart murmur    Hematuria 04/27/2012   Hepatic cyst    Hiatal hernia    Hiatal hernia     Hyperlipidemia, mixed 12/17/2014   Hypertension    IBS (irritable bowel syndrome)    Insomnia    Leaky heart valve    Low back pain 08/15/2013   Osteoporosis 03/2017   T score -2.5. 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   Sinusitis acute 02/03/2012   Thrush 07/21/2011   Tricuspid regurgitation, Mild-Moderate 07/21/2011   Moderate by 2 d echocardiogram Echo 11/2021: EF 60-65, no RWMA, normal RVSF, moderately elevated PASP, RVSP 52.1, trivial MR, mild to moderate TR   Unspecified hypothyroidism    Unspecified menopausal and postmenopausal disorder    Uterine prolapse without mention of vaginal wall prolapse    Vitamin B12 deficiency    Vitamin D deficiency 07/17/2016    Past Surgical History: Past Surgical History:  Procedure Laterality Date   APPENDECTOMY     endoscopic hemoclip     eye surgery Left    lens implant   HYSTEROSCOPY     POLYP   OPEN REDUCTION INTERNAL FIXATION (ORIF) DISTAL RADIAL FRACTURE Left 01/17/2019   Procedure: LEFT OPEN REDUCTION INTERNAL FIXATION (ORIF) DISTAL RADIUS FRACTURE;  Surgeon: Cindee Salt, MD;  Location: [facility];  Service: Orthopedics;  Laterality: Left;  AXILLARY BLOCK   PERIPHERAL VASCULAR CATHETERIZATION Left 07/24/2016   Procedure: Renal Angiography;  Surgeon: Fransisco Hertz, MD;  Location: [facility] INVASIVE CV LAB;  Service: Cardiovascular;  Laterality: Left;   RIGHT HEART CATH N/A 01/01/2022   Procedure: RIGHT HEART CATH;  Surgeon: Tonny Bollman, MD;  Location: [facility] INVASIVE CV LAB;  Service: Cardiovascular;  Laterality: N/A;   ROBOTIC ASSISTED BILATERAL SALPINGO OOPHERECTOMY  11/15/2020   ROBOTIC ASSISTED LAPAROSCOPIC SACROCOLPOPEXY Bilateral 11/15/2020   Procedure: XI ROBOTIC ASSISTED LAPAROSCOPIC SACROCOLPOPEXY AND SUPRACERVICIAL  HYSTERECTOMY BILATERAL SALPINGO-OOPHERECTOMY;  Surgeon: Crist Fat, MD;  Location: [facility];  Service: Urology;  Laterality: Bilateral;  REQUESTING 4.5 HRS   ROBOTIC ASSISTED SUPRACERVICAL HYSTERECTOMY  11/15/2020   UPPER GI ENDOSCOPY     UTERINE FIBROID SURGERY      Allergies: Allergies as of 01/09/2023 - Review Complete 01/01/2023  Allergen Reaction Noted   Sulfamethoxazole Shortness Of Breath    Amitriptyline Anxiety and Other (See Comments) 04/26/2015   Cymbalta [duloxetine hcl] Other (See Comments) 12/18/2021   Lactose intolerance (gi) Diarrhea and Nausea And Vomiting 01/14/2016   Lexapro [escitalopram oxalate]  06/12/2016   Topamax [topiramate] Itching 12/18/2021   Prednisone Palpitations 02/03/2020    Medications: Outpatient Encounter Medications as of 01/09/2023  Medication Sig   albuterol (VENTOLIN HFA) 108 (90 Base) MCG/ACT inhaler TAKE 2 PUFFS BY MOUTH EVERY 6 HOURS AS NEEDED FOR WHEEZE OR SHORTNESS OF BREATH   ALPRAZolam (XANAX) 1 MG tablet Take 1 tablet (1 mg total) by mouth 2 (two) times daily as needed.   Alum & Mag Hydroxide-Simeth (MYLANTA PO) Take by mouth. PRN   Alum Hydroxide-Mag Carbonate (GAVISCON PO) Take by mouth. PRN   aspirin EC 81 MG tablet Take 81 mg by mouth daily. Swallow whole.   Biotin 10 MG TABS Take by mouth.   cholecalciferol (VITAMIN D3) 25 MCG (1000 UNIT) tablet Take 5,000 Units by mouth daily.   cyclobenzaprine (FLEXERIL) 5 MG tablet Take 1 tablet (5 mg total) by mouth 3 (three) times daily as needed for muscle spasms.   dicyclomine (BENTYL) 10 MG capsule    diltiazem (CARDIZEM CD) 240 MG 24 hr capsule Take 1 capsule (240 mg total) by mouth daily.   DULoxetine (CYMBALTA) 20 MG capsule Take 1 capsule (20 mg total) by mouth daily.   estradiol (ESTRACE) 0.1 MG/GM vaginal cream Place 0.5 g vaginally 2 (two) times a week. Place 0.5g nightly for two weeks then twice a week after   Evolocumab (REPATHA SURECLICK) 140 MG/ML SOAJ Inject 140 mg into  the skin every 14 (fourteen) days.   famotidine-calcium carbonate-magnesium hydroxide (PEPCID COMPLETE) 10-800-165 MG chewable tablet Chew 1 tablet by mouth daily as needed.   fluticasone (FLONASE) 50 MCG/ACT nasal spray SPRAY 2 SPRAYS INTO EACH NOSTRIL EVERY DAY   gabapentin (NEURONTIN) 300 MG capsule Take 1 capsule twice daily   hydrochlorothiazide (HYDRODIURIL) 25 MG tablet Take 25 mg by mouth daily.   metoprolol succinate (TOPROL-XL) 50 MG 24 hr tablet Take 1 tablet (50 mg total) by mouth daily. Take with or immediately following a meal.   Multiple Vitamin (MULTIVITAMIN) tablet Take 1 tablet by mouth daily.   ondansetron (ZOFRAN) 4 MG tablet TAKE 1 TABLET BY MOUTH EVERY 8 HOURS AS NEEDED FOR NAUSEA AND VOMITING   promethazine (PHENERGAN) 25 MG tablet TAKE 1 TABLET BY MOUTH EVERY 6 HOURS AS NEEDED FOR NAUSEA OR VOMITING.   Tenapanor HCl 50 MG TABS Take 50 mg by mouth 2 (two) times daily.   traZODone (DESYREL) 50 MG tablet TAKE 1/2 TO 1 TABLET BY MOUTH AT BEDTIME AS  NEEDED FOR SLEEP   Ubrogepant (UBRELVY) 100 MG TABS Take 1 tablet (100 mg total) by mouth as needed (Take 1 tablet at onset of headache, may repeat in 2 hours if needed, max is 200 mg in 24 hours).   Vitamin E (VITAMIN E/D-ALPHA NATURAL) 268 MG (400 UNIT) CAPS Take by mouth.   No facility-administered encounter medications on file as of 01/09/2023.    Social History: Social History   Tobacco Use   Smoking status: Former    Types: Cigarettes    Quit date: 08/19/1995    Years since quitting: 27.4   Smokeless tobacco: Never  Vaping Use   Vaping Use: Never used  Substance Use Topics   Alcohol use: Never    Alcohol/week: 0.0 standard drinks of alcohol   Drug use: Never    Family Medical History: Family History  Problem Relation Age of Onset   Colon cancer Mother    Diabetes Mother    Hypertension Mother    Colon polyps Father    Parkinson's disease Father    Colon cancer Maternal Grandmother    Breast cancer Maternal  Grandmother 7   Diabetes Paternal Grandmother    Breast cancer Paternal Grandmother 59   Allergies Sister    Arthritis Brother        b/l hip replace   Alcohol abuse Daughter    Arthritis Son    Hypertension Son    Allergies Son    Osteoporosis Son    Osteoporosis Son    Arthritis Son        back disease, DDD   Allergies Son    Arthritis Son    Irritable bowel syndrome Son        RSD   Hypertension Paternal Grandfather    Heart disease Paternal Grandfather    Aneurysm Paternal Grandfather    Colon cancer Maternal Aunt    Colon cancer Cousin        X3   Ovarian cancer Cousin     Physical Examination: There were no vitals filed for this visit.  General: Patient is well developed, well nourished, calm, collected, and in no apparent distress. Attention to examination is appropriate.  Respiratory: Patient is breathing without any difficulty.   NEUROLOGICAL:     Awake, alert, oriented to person, place, and time.  Speech is clear and fluent. Fund of knowledge is appropriate.   Cranial Nerves: Pupils equal round and reactive to light.  Facial tone is symmetric.    *** ROM of cervical spine *** pain *** posterior cervical tenderness. *** tenderness in bilateral trapezial region.   *** ROM of lumbar spine *** pain *** posterior lumbar tenderness.   No abnormal lesions on exposed skin.   Strength: Side Biceps Triceps Deltoid Interossei Grip Wrist Ext. Wrist Flex.  R 5 5 5 5 5 5 5   L 5 5 5 5 5 5 5    Side Iliopsoas Quads Hamstring PF DF EHL  R 5 5 5 5 5 5   L 5 5 5 5 5 5    Reflexes are ***2+ and symmetric at the biceps, triceps, brachioradialis, patella and achilles.   Hoffman's is absent.  Clonus is not present.   Bilateral upper and lower extremity sensation is intact to light touch.     Gait is normal.   ***No difficulty with tandem gait.    Medical Decision Making  Imaging: Cervical MRI dated 05/26/22:  FINDINGS: Alignment: Mild retrolisthesis C4-5 and  C5-6. Straightening of the cervical lordosis.  Vertebrae: Negative for fracture or mass   Cord: Normal signal and morphology   Posterior Fossa, vertebral arteries, paraspinal tissues: Negative   Disc levels:   C2-3: Negative   C3-4: Negative   C4-5: Disc degeneration with diffuse uncinate spurring. Cord flattening with mild spinal stenosis. Mild to moderate foraminal stenosis bilaterally.   C5-6: Disc degeneration with diffuse uncinate spurring. Mild spinal stenosis. Moderate foraminal stenosis bilaterally   C6-7: Mild disc degeneration.  No significant stenosis   C7-T1: Negative   IMPRESSION: Disc degeneration and spondylosis at C4-5 and C5-6. Mild spinal stenosis and foraminal stenosis bilaterally at both levels.     Electronically Signed   By: Marlan Palau M.D.   On: 05/27/2022 20:44   Cervical xrays dated 05/02/22:  FINDINGS: There is no evidence of cervical spine fracture or prevertebral soft tissue swelling. There is mild anterolisthesis at C3-C4 and retrolisthesis at C4-C5. Intervertebral disc space narrowing is noted at C4-C5 and C5-C6, with neural foraminal stenosis at these levels on the right. Evaluation of the left neural foramina are limited due to patient positioning. Facet arthropathy is present bilaterally.   IMPRESSION: Multilevel degenerative changes, most pronounced at C4-C5 and C5-C6.     Electronically Signed   By: Thornell Sartorius M.D.   On: 05/02/2022 20:36    I have personally reviewed the images and agree with the above interpretation.  Assessment and Plan: Megan [name] is a pleasant 72 y.o. female has ***  Treatment options discussed with patient and following plan made:   - Order for physical therapy for *** spine ***. Patient to call to schedule appointment. *** - Continue current medications including ***. Reviewed dosing and side effects.  - Prescription for ***. Reviewed dosing and side effects. Take with food.  -  Prescription for *** to take prn muscle spasms. Reviewed dosing and side effects. Discussed this can cause drowsiness.  - MRI of *** to further evaluate *** radiculopathy. No improvement time or medications (***).  - Referral to PMR at [facility] to discuss possible *** injections.  - Will schedule phone visit to review MRI results once I get them back.   I spent a total of *** minutes in face-to-face and non-face-to-face activities related to this patient's care today including review of outside records, review of imaging, review of symptoms, physical exam, discussion of differential diagnosis, discussion of treatment options, and documentation.   Thank you for involving me in the care of this patient.   Drake Leach PA-C Dept. of Neurosurgery

## 2023-01-06 DIAGNOSIS — H903 Sensorineural hearing loss, bilateral: Secondary | ICD-10-CM | POA: Diagnosis not present

## 2023-01-08 ENCOUNTER — Other Ambulatory Visit: Payer: Self-pay | Admitting: Family Medicine

## 2023-01-08 DIAGNOSIS — J4520 Mild intermittent asthma, uncomplicated: Secondary | ICD-10-CM

## 2023-01-09 ENCOUNTER — Ambulatory Visit: Payer: Medicare HMO | Admitting: Orthopedic Surgery

## 2023-01-19 ENCOUNTER — Ambulatory Visit: Payer: Medicare HMO | Attending: Cardiovascular Disease | Admitting: Pharmacist

## 2023-01-19 VITALS — BP 134/66 | HR 78

## 2023-01-19 DIAGNOSIS — I10 Essential (primary) hypertension: Secondary | ICD-10-CM | POA: Diagnosis not present

## 2023-01-19 DIAGNOSIS — E782 Mixed hyperlipidemia: Secondary | ICD-10-CM | POA: Diagnosis not present

## 2023-01-19 MED ORDER — METOPROLOL SUCCINATE ER 25 MG PO TB24: 25.0000 mg | ORAL_TABLET | Freq: Every day | ORAL | 3 refills | Status: DC

## 2023-01-19 NOTE — Patient Instructions (Addendum)
Your blood pressure goal is < 130/40mmHg   Your home cuff is measuring accurately  Continue taking your current medications  I sent in a refill of the lower metoprolol 25mg  dose for you to take 1 daily   Important lifestyle changes to control high blood pressure  Intervention  Effect on the BP   Weight loss Weight loss is one of the most effective lifestyle changes for controlling blood pressure. If you're overweight or obese, losing even a small amount of weight can help reduce blood pressure.    Blood pressure can decrease by 1 millimeter of mercury (mmHg) with each kilogram (about 2.2 pounds) of weight lost.   Exercise regularly As a general goal, aim for 30 minutes of moderate physical activity every day.    Regular physical activity can lower blood pressure by 5 - 8 mmHg.   Eat a healthy diet Eat a diet rich in whole grains, fruits, vegetables, lean meat, and low-fat dairy products. Limit processed foods, saturated fat, and sweets.    A heart-healthy diet can lower high blood pressure by 10 mmHg.   Reduce salt (sodium) in your diet Aim for 000mg  of sodium each day. Avoid deli meats, canned food, and frozen microwave meals which are high in sodium.     Limiting sodium can reduce blood pressure by 5 mmHg.   Limit alcohol One drink equals 12 ounces of beer, 5 ounces of wine, or 1.5 ounces of 80-proof liquor.    Limiting alcohol to < 1 drink a day for women or < 2 drinks a day for men can help lower blood pressure by about 4 mmHg.   To check your pressure at home you will need to:   Sit up in a chair, with feet flat on the floor and back supported. Do not cross your ankles or legs. Rest your left arm so that the cuff is about heart level. If the cuff goes on your upper arm, then just relax your arm on the table, arm of the chair, or your lap. If you have a wrist cuff, hold your wrist against your chest at heart level. Place the cuff snugly around your arm, about 1 inch  above the crease of your elbow. The cords should be inside the groove of your elbow.  Sit quietly, with the cuff in place, for about 5 minutes. Then press the power button to start a reading. Do not talk or move while the reading is taking place.  Record your readings on a sheet of paper. Although most cuffs have a memory, it is often easier to see a pattern developing when the numbers are all in front of you.  You can repeat the reading after 1-3 minutes if it is recommended.   Make sure your bladder is empty and you have not had caffeine or tobacco within the last 30 minutes   Always bring your blood pressure log with you to your appointments. If you have not brought your monitor in to be double checked for accuracy, please bring it to your next appointment.   You can find a list of validated (accurate) blood pressure cuffs at: validatebp.org

## 2023-01-19 NOTE — Progress Notes (Signed)
Patient ID: Megan [name]                 DOB: Jun 03, 1951                      MRN: [id number]     HPI: Megan [name] is a 72 y.o. female referred by Dr. Excell Seltzer to HTN clinic. PMH is significant for coronary CT 12/24/21 with calcium score of 115 (72nd percentile), stroke, HTN, HLD, adrenal adenoma, asthma, chest pain, depression, IBS, tricuspid regurgitation, and hypothyroidism.    Patient seen in PharmD clinic 06/16/22. She was started on Repatha given prior statin intolerance, with excellent follow up LDL at 32. Blood pressure was elevated in clinic. Valsartan was increased to 80mg  daily. Follow up labs stable.    In visit on 09/09/2022 patient brought in her home blood pressure cuff. History of medications was somewhat confusing. Patient stated she was off and on HCTZ since the summer and was not currently taking it. She also stated she started taking valsartan 80mg  but experienced nausea and some lightheadedness so she decreased to 1/2 tablet (40mg ) and had been only taking 40mg  daily. She was also taking metoprolol succinate 50mg  daily instead of 25mg  BID. Valsartan was increased to 40mg  BID, HCTZ held, and metoprolol continued at 50mg  daily.   Saw PharmD for follow up 10/23/22 where BP remained elevated at 152/68. She was taking diltiazem 240mg  daily and metoprolol succinate 50mg  daily, but had stopped taking her valsartan on 3/3. Reported home BP 120-140 systolic. Reported concerns with her left side: hearing loss/aches, swollen glands, head pain, shoulder pain down to the elbow accompanied by nausea. Contacted her dentist about left tooth pain and swollen gums, prescribed Pen VK x7 days, didn't finish course due to not feeling relief. PCP and neuro follow up were scheduled.   Unclear why pt had stopped taking her valsartan. Still has HCTZ on her med list as well which it does not seem like pt is taking. Pt presents today for follow up. She is not taking HCTZ - took 1 dose the day she went  to the ED a month ago with leg swelling and difficulty urinating. ED recommended f/u with urology, pt following closely with them. Reports she stopped her valsartan secondary to nausea and dizziness. Reports she decreased her metoprolol from 50mg  to 25mg  about a month ago because she was experiencing low BP. Brings in a detailed log today - did have readings of 97/61, 105/69 etc. Feels dizzy and weak when her BP is that low. No falls. BP better on lower dose of metoprolol, occasional low but for the most part very well controlled with SBP 110-130 range. Concerned her HR increased to 70-80 range after decreasing her metoprolol, advised her this is still in the normal range. Checks her BP in the AM upon wakening (before her AM metoprolol and diltiazem) and in the evening using a wrist cuff. Cuff brought to clinic today and is measuring accurately - 137/68 with her cuff, 134/66 with my manual check.   Home BP readings: 119/64, 125/69, 115/59, 107/54, 130/65, 119/62, 112/63, 130/69, 119/66, 117/61, 131/71, 114/60, 128/65, 104/57, 126/69, 127/71, 129/66, 108/52, 122/60.  Has been more fatigued the past 3 weeks or so. Reports increase in fatigue for a few days after Repatha injections. Discussed cholesterol results, pt ok continuing on Repatha given prior statin intolerance. Following with neuro, reports they told her she had hx of mini strokes. Checking CT of her neck given  neck pain, also follows with them for headaches. Avoids NSAIDs.   Current HTN meds:  Metoprolol succinate 25mg  daily - AM with breakfast Diltiazem CD 240mg  daily - AM with breakfast  Previously tried: Valsartan 40 mg BID - dizzy, nauseous HCTZ 25mg  daily - low BP  BP goal: < 130/81mmHg  Social History: Former smoker, quit 08/19/1995. No smokeless tobacco, no alcohol use, no drug use.   Diet: 1 cup of coffee in the AM, tea with dinner, otherwise water, no other caffeine. Doesn't add salt to food.    Exercise: walks the dog every other  day, but limited due to weather recently    Home BP readings:  Labs: 12/18/22: SCr 0.66, K 3.4, Na 133  Wt Readings from Last 3 Encounters:  01/01/23 144 lb 8 oz (65.5 kg)  12/30/22 145 lb 12.8 oz (66.1 kg)  12/18/22 140 lb (63.5 kg)   BP Readings from Last 3 Encounters:  01/01/23 127/66  01/01/23 (!) 146/79  12/30/22 136/68   Pulse Readings from Last 3 Encounters:  01/01/23 79  01/01/23 68  12/30/22 74    Renal function: CrCl cannot be calculated (Patient's most recent lab result is older than the maximum 21 days allowed.).  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 difficile diarrhea    Cancer (HCC)    pt denies    Chest pain, atypical 07/14/2011   Cold sore 07/17/2016   Colon polyp    Contact dermatitis 03/23/2012   Cystocele, midline    Depression    Diarrhea    Diverticulosis    Endometrial hyperplasia 02/27/2006   BENIGN ENDO BX ON 02/2007   GERD (gastroesophageal reflux disease)    Headache 04/16/2015   Headache    Headache(784.0) 04/15/2012   pt denies    Heart murmur    Hematuria 04/27/2012   Hepatic cyst    Hiatal hernia    Hiatal hernia    Hyperlipidemia, mixed 12/17/2014   Hypertension    IBS (irritable bowel syndrome)    Insomnia    Leaky heart valve    Low back pain 08/15/2013   Osteoporosis 03/2017   T score -2.5. 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   Sinusitis acute 02/03/2012   Thrush 07/21/2011   Tricuspid regurgitation, Mild-Moderate 07/21/2011   Moderate by 2 d echocardiogram Echo 11/2021: EF 60-65, no RWMA, normal RVSF, moderately elevated PASP,  RVSP 52.1, trivial MR, mild to moderate TR   Unspecified hypothyroidism    Unspecified menopausal and postmenopausal disorder    Uterine prolapse without mention of vaginal wall prolapse    Vitamin B12 deficiency    Vitamin D deficiency 07/17/2016    Current Outpatient Medications on File Prior to Visit  Medication Sig Dispense Refill   albuterol (VENTOLIN HFA) 108 (90 Base) MCG/ACT inhaler TAKE 2 PUFFS BY MOUTH EVERY 6 HOURS AS NEEDED FOR WHEEZE OR SHORTNESS OF BREATH 8.5 each 1   ALPRAZolam (XANAX) 1 MG tablet Take 1 tablet (1 mg total) by mouth 2 (two) times daily as needed. 45 tablet  1   Alum & Mag Hydroxide-Simeth (MYLANTA PO) Take by mouth. PRN     Alum Hydroxide-Mag Carbonate (GAVISCON PO) Take by mouth. PRN     aspirin EC 81 MG tablet Take 81 mg by mouth daily. Swallow whole.     Biotin 10 MG TABS Take by mouth.     cholecalciferol (VITAMIN D3) 25 MCG (1000 UNIT) tablet Take 5,000 Units by mouth daily.     cyclobenzaprine (FLEXERIL) 5 MG tablet Take 1 tablet (5 mg total) by mouth 3 (three) times daily as needed for muscle spasms. 60 tablet 1   dicyclomine (BENTYL) 10 MG capsule      diltiazem (CARDIZEM CD) 240 MG 24 hr capsule Take 1 capsule (240 mg total) by mouth daily. 90 capsule 3   DULoxetine (CYMBALTA) 20 MG capsule Take 1 capsule (20 mg total) by mouth daily. 30 capsule 5   estradiol (ESTRACE) 0.1 MG/GM vaginal cream Place 0.5 g vaginally 2 (two) times a week. Place 0.5g nightly for two weeks then twice a week after 30 g 11   Evolocumab (REPATHA SURECLICK) 140 MG/ML SOAJ Inject 140 mg into the skin every 14 (fourteen) days. 6 mL 3   famotidine-calcium carbonate-magnesium hydroxide (PEPCID COMPLETE) 10-800-165 MG chewable tablet Chew 1 tablet by mouth daily as needed.     fluticasone (FLONASE) 50 MCG/ACT nasal spray SPRAY 2 SPRAYS INTO EACH NOSTRIL EVERY DAY 48 mL 6   gabapentin (NEURONTIN) 300 MG capsule Take 1 capsule twice daily 60 capsule 5   hydrochlorothiazide  (HYDRODIURIL) 25 MG tablet Take 25 mg by mouth daily.     metoprolol succinate (TOPROL-XL) 50 MG 24 hr tablet Take 1 tablet (50 mg total) by mouth daily. Take with or immediately following a meal. 90 tablet 3   Multiple Vitamin (MULTIVITAMIN) tablet Take 1 tablet by mouth daily.     ondansetron (ZOFRAN) 4 MG tablet TAKE 1 TABLET BY MOUTH EVERY 8 HOURS AS NEEDED FOR NAUSEA AND VOMITING 20 tablet 3   promethazine (PHENERGAN) 25 MG tablet TAKE 1 TABLET BY MOUTH EVERY 6 HOURS AS NEEDED FOR NAUSEA OR VOMITING. 30 tablet 2   Tenapanor HCl 50 MG TABS Take 50 mg by mouth 2 (two) times daily.     traZODone (DESYREL) 50 MG tablet TAKE 1/2 TO 1 TABLET BY MOUTH AT BEDTIME AS NEEDED FOR SLEEP 90 tablet 3   Ubrogepant (UBRELVY) 100 MG TABS Take 1 tablet (100 mg total) by mouth as needed (Take 1 tablet at onset of headache, may repeat in 2 hours if needed, max is 200 mg in 24 hours). 12 tablet 11   Vitamin E (VITAMIN E/D-ALPHA NATURAL) 268 MG (400 UNIT) CAPS Take by mouth.     No current facility-administered medications on file prior to visit.    Allergies  Allergen Reactions   Sulfamethoxazole Shortness Of Breath    chest tightness   Amitriptyline Anxiety and Other (See Comments)    Depression, insomnia   Cymbalta [Duloxetine Hcl] Other (See Comments)    Insomnia, constipation   Lactose Intolerance (Gi) Diarrhea and Nausea And Vomiting   Lexapro [Escitalopram Oxalate]     nausea   Topamax [Topiramate] Itching   Prednisone Palpitations     Assessment/Plan:  1. Hypertension - BP slightly elevated in clinic however home cuff is measuring accurately and home readings are very well controlled at goal < 130/27mmHg. Will continue diltiazem 240mg  daily and metoprolol succinate 25mg  daily, new rx sent in as pt decreased her dose from  50mg  a month ago due to low BP readings. Pt will continue monitoring BP at home and call clinic with any concerns.   2. Hyperlipidemia - Pt with prior intolerance to  atorvastatin, rosuvastatin, and ezetimibe. LDL excellent at 32 since starting Repatha, at goal < 70 given elevated calcium score and hx of stroke. Reports some fatigue for a few days after her injections. Discussed skipping a dose to see if sx improve, them resume to see if sx return. Pt agreeable to continuing on Repatha after reviewing her cholesterol results on therapy.  F/u with PharmD as needed.  Peighton Edgin E. Arlesia Kiel, PharmD, BCACP, CPP [facility] [address] [place], [place] [address] Phone: [phone]; Fax: [phone] 01/19/2023 9:57 AM

## 2023-01-20 ENCOUNTER — Ambulatory Visit: Payer: Medicare HMO | Admitting: Family Medicine

## 2023-01-23 ENCOUNTER — Other Ambulatory Visit: Payer: Self-pay | Admitting: Neurology

## 2023-01-29 ENCOUNTER — Ambulatory Visit: Payer: Medicare HMO | Admitting: Family Medicine

## 2023-01-29 ENCOUNTER — Ambulatory Visit
Admission: RE | Admit: 2023-01-29 | Discharge: 2023-01-29 | Disposition: A | Discharge status: Home / Self Care | Payer: Medicare HMO | Source: Ambulatory Visit | Attending: Physician Assistant | Admitting: Physician Assistant

## 2023-01-29 DIAGNOSIS — H9202 Otalgia, left ear: Secondary | ICD-10-CM

## 2023-01-29 DIAGNOSIS — E041 Nontoxic single thyroid nodule: Secondary | ICD-10-CM | POA: Diagnosis not present

## 2023-01-29 DIAGNOSIS — M542 Cervicalgia: Secondary | ICD-10-CM | POA: Diagnosis not present

## 2023-01-29 MED ORDER — IOPAMIDOL (ISOVUE-300) INJECTION 61%
75.0000 mL | Freq: Once | INTRAVENOUS | Status: AC | PRN
  Administered 2023-01-29: 75 mL via INTRAVENOUS

## 2023-01-30 ENCOUNTER — Ambulatory Visit (INDEPENDENT_AMBULATORY_CARE_PROVIDER_SITE_OTHER): Payer: Medicare HMO

## 2023-01-30 VITALS — Ht 66.0 in | Wt 146.0 lb

## 2023-01-30 DIAGNOSIS — Z0000 Encounter for general adult medical examination without abnormal findings: Secondary | ICD-10-CM | POA: Diagnosis not present

## 2023-01-30 NOTE — Patient Instructions (Signed)
Megan [name] , Thank you for taking time to come for your Medicare Wellness Visit. I appreciate your ongoing commitment to your health goals. Please review the following plan we discussed and let me know if I can assist you in the future.   These are the goals we discussed:  Goals      Patient Stated     01/30/2023, stay healthy     Weight (lb) < 155 lb (70.3 kg)     Lose weight by eating better.         This is a list of the screening recommended for you and due dates:  Health Maintenance  Topic Date Due   Hepatitis C Screening  Never done   COVID-19 Vaccine (1) 02/15/2023*   Zoster (Shingles) Vaccine (1 of 2) 05/02/2023*   Pneumonia Vaccine (1 of 1 - PCV) 01/30/2024*   Flu Shot  03/19/2023   Medicare Annual Wellness Visit  01/30/2024   DTaP/Tdap/Td vaccine (3 - Td or Tdap) 09/11/2024   Mammogram  11/19/2024   Colon Cancer Screening  07/10/2025   DEXA scan (bone density measurement)  Completed   HPV Vaccine  Aged Out  *Topic was postponed. The date shown is not the original due date.    Advanced directives: Advance directive discussed with you today.   Conditions/risks identified: none  Next appointment: Follow up in one year for your annual wellness visit    Preventive Care 65 Years and Older, Female Preventive care refers to lifestyle choices and visits with your health care provider that can promote health and wellness. What does preventive care include? A yearly physical exam. This is also called an annual well check. Dental exams once or twice a year. Routine eye exams. Ask your health care provider how often you should have your eyes checked. Personal lifestyle choices, including: Daily care of your teeth and gums. Regular physical activity. Eating a healthy diet. Avoiding tobacco and drug use. Limiting alcohol use. Practicing safe sex. Taking low-dose aspirin every day. Taking vitamin and mineral supplements as recommended by your health care  provider. What happens during an annual well check? The services and screenings done by your health care provider during your annual well check will depend on your age, overall health, lifestyle risk factors, and family history of disease. Counseling  Your health care provider may ask you questions about your: Alcohol use. Tobacco use. Drug use. Emotional well-being. Home and relationship well-being. Sexual activity. Eating habits. History of falls. Memory and ability to understand (cognition). Work and work Astronomer. Reproductive health. Screening  You may have the following tests or measurements: Height, weight, and BMI. Blood pressure. Lipid and cholesterol levels. These may be checked every 5 years, or more frequently if you are over 77 years old. Skin check. Lung cancer screening. You may have this screening every year starting at age 51 if you have a 30-pack-year history of smoking and currently smoke or have quit within the past 15 years. Fecal occult blood test (FOBT) of the stool. You may have this test every year starting at age 21. Flexible sigmoidoscopy or colonoscopy. You may have a sigmoidoscopy every 5 years or a colonoscopy every 10 years starting at age 34. Hepatitis C blood test. Hepatitis B blood test. Sexually transmitted disease (STD) testing. Diabetes screening. This is done by checking your blood sugar (glucose) after you have not eaten for a while (fasting). You may have this done every 1-3 years. Bone density scan. This is done to screen  for osteoporosis. You may have this done starting at age 49. Mammogram. This may be done every 1-2 years. Talk to your health care provider about how often you should have regular mammograms. Talk with your health care provider about your test results, treatment options, and if necessary, the need for more tests. Vaccines  Your health care provider may recommend certain vaccines, such as: Influenza vaccine. This is  recommended every year. Tetanus, diphtheria, and acellular pertussis (Tdap, Td) vaccine. You may need a Td booster every 10 years. Zoster vaccine. You may need this after age 33. Pneumococcal 13-valent conjugate (PCV13) vaccine. One dose is recommended after age 46. Pneumococcal polysaccharide (PPSV23) vaccine. One dose is recommended after age 75. Talk to your health care provider about which screenings and vaccines you need and how often you need them. This information is not intended to replace advice given to you by your health care provider. Make sure you discuss any questions you have with your health care provider. Document Released: 08/31/2015 Document Revised: 04/23/2016 Document Reviewed: 06/05/2015 Elsevier Interactive Patient Education  2017 [place] Prevention in the Home Falls can cause injuries. They can happen to people of all ages. There are many things you can do to make your home safe and to help prevent falls. What can I do on the outside of my home? Regularly fix the edges of walkways and driveways and fix any cracks. Remove anything that might make you trip as you walk through a door, such as a raised step or threshold. Trim any bushes or trees on the path to your home. Use bright outdoor lighting. Clear any walking paths of anything that might make someone trip, such as rocks or tools. Regularly check to see if handrails are loose or broken. Make sure that both sides of any steps have handrails. Any raised decks and porches should have guardrails on the edges. Have any leaves, snow, or ice cleared regularly. Use sand or salt on walking paths during winter. Clean up any spills in your garage right away. This includes oil or grease spills. What can I do in the bathroom? Use night lights. Install grab bars by the toilet and in the tub and shower. Do not use towel bars as grab bars. Use non-skid mats or decals in the tub or shower. If you need to sit down in  the shower, use a plastic, non-slip stool. Keep the floor dry. Clean up any water that spills on the floor as soon as it happens. Remove soap buildup in the tub or shower regularly. Attach bath mats securely with double-sided non-slip rug tape. Do not have throw rugs and other things on the floor that can make you trip. What can I do in the bedroom? Use night lights. Make sure that you have a light by your bed that is easy to reach. Do not use any sheets or blankets that are too big for your bed. They should not hang down onto the floor. Have a firm chair that has side arms. You can use this for support while you get dressed. Do not have throw rugs and other things on the floor that can make you trip. What can I do in the kitchen? Clean up any spills right away. Avoid walking on wet floors. Keep items that you use a lot in easy-to-reach places. If you need to reach something above you, use a strong step stool that has a grab bar. Keep electrical cords out of the way. Do  not use floor polish or wax that makes floors slippery. If you must use wax, use non-skid floor wax. Do not have throw rugs and other things on the floor that can make you trip. What can I do with my stairs? Do not leave any items on the stairs. Make sure that there are handrails on both sides of the stairs and use them. Fix handrails that are broken or loose. Make sure that handrails are as long as the stairways. Check any carpeting to make sure that it is firmly attached to the stairs. Fix any carpet that is loose or worn. Avoid having throw rugs at the top or bottom of the stairs. If you do have throw rugs, attach them to the floor with carpet tape. Make sure that you have a light switch at the top of the stairs and the bottom of the stairs. If you do not have them, ask someone to add them for you. What else can I do to help prevent falls? Wear shoes that: Do not have high heels. Have rubber bottoms. Are comfortable  and fit you well. Are closed at the toe. Do not wear sandals. If you use a stepladder: Make sure that it is fully opened. Do not climb a closed stepladder. Make sure that both sides of the stepladder are locked into place. Ask someone to hold it for you, if possible. Clearly mark and make sure that you can see: Any grab bars or handrails. First and last steps. Where the edge of each step is. Use tools that help you move around (mobility aids) if they are needed. These include: Canes. Walkers. Scooters. Crutches. Turn on the lights when you go into a dark area. Replace any light bulbs as soon as they burn out. Set up your furniture so you have a clear path. Avoid moving your furniture around. If any of your floors are uneven, fix them. If there are any pets around you, be aware of where they are. Review your medicines with your doctor. Some medicines can make you feel dizzy. This can increase your chance of falling. Ask your doctor what other things that you can do to help prevent falls. This information is not intended to replace advice given to you by your health care provider. Make sure you discuss any questions you have with your health care provider. Document Released: 05/31/2009 Document Revised: 01/10/2016 Document Reviewed: 09/08/2014 Elsevier Interactive Patient Education  2017 [employer].

## 2023-01-30 NOTE — Progress Notes (Signed)
I connected with  Bronwen Betters on 01/30/23 by a audio enabled telemedicine application and verified that I am speaking with the correct person using two identifiers.  Patient Location: Home  Provider Location: Office/Clinic  I discussed the limitations of evaluation and management by telemedicine. The patient expressed understanding and agreed to proceed. Subjective:   Megan [name] is a 72 y.o. female who presents for Medicare Annual (Subsequent) preventive examination.  Review of Systems     Cardiac Risk Factors include: advanced age (>1men, >65 women);hypertension     Objective:    Today's Vitals   01/30/23 1107  Weight: 146 lb (66.2 kg)  Height: 5\' 6"  (1.676 m)  PainSc: 5    Body mass index is 23.57 kg/m.     01/30/2023   11:15 AM 12/18/2022    1:13 AM 08/07/2022    2:32 PM 01/01/2022   12:16 PM 11/21/2020    7:05 PM 11/21/2020   12:33 PM 11/15/2020    6:10 AM  Advanced Directives  Does Patient Have a Medical Advance Directive? No No No No No No No  Would patient like information on creating a medical advance directive?   No - Patient declined No - Patient declined No - Patient declined No - Guardian declined No - Patient declined    Current Medications (verified) Outpatient Encounter Medications as of 01/30/2023  Medication Sig   albuterol (VENTOLIN HFA) 108 (90 Base) MCG/ACT inhaler TAKE 2 PUFFS BY MOUTH EVERY 6 HOURS AS NEEDED FOR WHEEZE OR SHORTNESS OF BREATH   ALPRAZolam (XANAX) 1 MG tablet Take 1 tablet (1 mg total) by mouth 2 (two) times daily as needed.   Alum & Mag Hydroxide-Simeth (MYLANTA PO) Take by mouth. PRN   Alum Hydroxide-Mag Carbonate (GAVISCON PO) Take by mouth. PRN   aspirin EC 81 MG tablet Take 81 mg by mouth daily. Swallow whole.   Biotin 10 MG TABS Take by mouth.   cholecalciferol (VITAMIN D3) 25 MCG (1000 UNIT) tablet Take 5,000 Units by mouth daily.   cyclobenzaprine (FLEXERIL) 5 MG tablet Take 1 tablet (5 mg total) by mouth 3  (three) times daily as needed for muscle spasms.   dicyclomine (BENTYL) 10 MG capsule    diltiazem (CARDIZEM CD) 240 MG 24 hr capsule Take 1 capsule (240 mg total) by mouth daily.   DULoxetine (CYMBALTA) 20 MG capsule TAKE 1 CAPSULE BY MOUTH EVERY DAY   Evolocumab (REPATHA SURECLICK) 140 MG/ML SOAJ Inject 140 mg into the skin every 14 (fourteen) days.   famotidine-calcium carbonate-magnesium hydroxide (PEPCID COMPLETE) 10-800-165 MG chewable tablet Chew 1 tablet by mouth daily as needed.   fluticasone (FLONASE) 50 MCG/ACT nasal spray SPRAY 2 SPRAYS INTO EACH NOSTRIL EVERY DAY   gabapentin (NEURONTIN) 300 MG capsule Take 1 capsule twice daily   metoprolol succinate (TOPROL XL) 25 MG 24 hr tablet Take 1 tablet (25 mg total) by mouth daily.   ondansetron (ZOFRAN) 4 MG tablet TAKE 1 TABLET BY MOUTH EVERY 8 HOURS AS NEEDED FOR NAUSEA AND VOMITING   promethazine (PHENERGAN) 25 MG tablet TAKE 1 TABLET BY MOUTH EVERY 6 HOURS AS NEEDED FOR NAUSEA OR VOMITING.   Tenapanor HCl 50 MG TABS Take 50 mg by mouth 2 (two) times daily.   traZODone (DESYREL) 50 MG tablet TAKE 1/2 TO 1 TABLET BY MOUTH AT BEDTIME AS NEEDED FOR SLEEP   Vitamin E (VITAMIN E/D-ALPHA NATURAL) 268 MG (400 UNIT) CAPS Take by mouth.   estradiol (ESTRACE) 0.1 MG/GM vaginal cream  Place 0.5 g vaginally 2 (two) times a week. Place 0.5g nightly for two weeks then twice a week after (Patient not taking: Reported on 01/30/2023)   Multiple Vitamin (MULTIVITAMIN) tablet Take 1 tablet by mouth daily. (Patient not taking: Reported on 01/30/2023)   Ubrogepant (UBRELVY) 100 MG TABS Take 1 tablet (100 mg total) by mouth as needed (Take 1 tablet at onset of headache, may repeat in 2 hours if needed, max is 200 mg in 24 hours). (Patient not taking: Reported on 01/30/2023)   No facility-administered encounter medications on file as of 01/30/2023.    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 difficile diarrhea    Cancer (HCC)    pt denies    Chest pain, atypical 07/14/2011   Cold sore 07/17/2016   Colon polyp    Contact dermatitis 03/23/2012   Cystocele, midline    Depression    Diarrhea    Diverticulosis    Endometrial hyperplasia 02/27/2006   BENIGN ENDO BX ON 02/2007   GERD (gastroesophageal reflux disease)    Headache 04/16/2015   Headache    Headache(784.0) 04/15/2012   pt denies    Heart murmur    Hematuria 04/27/2012   Hepatic cyst    Hiatal hernia    Hiatal hernia    Hyperlipidemia, mixed 12/17/2014   Hypertension    IBS (irritable bowel syndrome)    Insomnia    Leaky heart valve    Low back pain 08/15/2013   Osteoporosis 03/2017   T score -2.5. 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   Sinusitis acute 02/03/2012   Thrush 07/21/2011   Tricuspid regurgitation, Mild-Moderate 07/21/2011   Moderate by 2 d echocardiogram Echo 11/2021: EF 60-65, no RWMA, normal RVSF, moderately elevated PASP, RVSP 52.1, trivial MR, mild to moderate TR   Unspecified hypothyroidism    Unspecified menopausal and postmenopausal disorder    Uterine prolapse without mention of vaginal wall prolapse    Vitamin B12 deficiency    Vitamin D deficiency 07/17/2016   Past Surgical History:  Procedure Laterality Date   APPENDECTOMY     endoscopic hemoclip     eye surgery Left    lens implant   HYSTEROSCOPY     POLYP    OPEN REDUCTION INTERNAL FIXATION (ORIF) DISTAL RADIAL FRACTURE Left 01/17/2019   Procedure: LEFT OPEN REDUCTION INTERNAL FIXATION (ORIF) DISTAL RADIUS FRACTURE;  Surgeon: Cindee Salt, MD;  Location: [facility];  Service: Orthopedics;  Laterality: Left;  AXILLARY BLOCK   PERIPHERAL VASCULAR CATHETERIZATION Left 07/24/2016   Procedure: Renal Angiography;  Surgeon: Fransisco Hertz, MD;  Location: [facility] INVASIVE CV LAB;  Service: Cardiovascular;  Laterality: Left;   RIGHT HEART CATH N/A 01/01/2022   Procedure: RIGHT HEART CATH;  Surgeon: Tonny Bollman, MD;  Location: [facility] INVASIVE CV LAB;  Service: Cardiovascular;  Laterality: N/A;   ROBOTIC ASSISTED BILATERAL SALPINGO OOPHERECTOMY  11/15/2020  ROBOTIC ASSISTED LAPAROSCOPIC SACROCOLPOPEXY Bilateral 11/15/2020   Procedure: XI ROBOTIC ASSISTED LAPAROSCOPIC SACROCOLPOPEXY AND SUPRACERVICIAL HYSTERECTOMY BILATERAL SALPINGO-OOPHERECTOMY;  Surgeon: Crist Fat, MD;  Location: [facility];  Service: Urology;  Laterality: Bilateral;  REQUESTING 4.5 HRS   ROBOTIC ASSISTED SUPRACERVICAL HYSTERECTOMY  11/15/2020   UPPER GI ENDOSCOPY     UTERINE FIBROID SURGERY     Family History  Problem Relation Age of Onset   Colon cancer Mother    Diabetes Mother    Hypertension Mother    Colon polyps Father    Parkinson's disease Father    Colon cancer Maternal Grandmother    Breast cancer Maternal Grandmother 20   Diabetes Paternal Grandmother    Breast cancer Paternal Grandmother 35   Allergies Sister    Arthritis Brother        b/l hip replace   Alcohol abuse Daughter    Arthritis Son    Hypertension Son    Allergies Son    Osteoporosis Son    Osteoporosis Son    Arthritis Son        back disease, DDD   Allergies Son    Arthritis Son    Irritable bowel syndrome Son        RSD   Hypertension Paternal Grandfather    Heart disease Paternal Grandfather    Aneurysm Paternal Grandfather    Colon cancer Maternal Aunt    Colon cancer Cousin         X3   Ovarian cancer Cousin    Social History   Socioeconomic History   Marital status: Married    Spouse name: Not on file   Number of children: 4   Years of education: 12   Highest education level: 10th grade  Occupational History   Occupation: Retired  Tobacco Use   Smoking status: Former    Types: Cigarettes    Quit date: 08/19/1995    Years since quitting: 27.4   Smokeless tobacco: Never  Vaping Use   Vaping Use: Never used  Substance and Sexual Activity   Alcohol use: Never    Alcohol/week: 0.0 standard drinks of alcohol   Drug use: Never   Sexual activity: Yes    Birth control/protection: Post-menopausal    Comment: 1st intercourse 38 yo-5 partners  Other Topics Concern   Not on file  Social History Narrative   Lives at home with her husband.   Right-handed.   2 cups caffeine per day.   Social Determinants of Health   Financial Resource Strain: Low Risk  (01/30/2023)   Overall Financial Resource Strain (CARDIA)    Difficulty of Paying Living Expenses: Not hard at all  Food Insecurity: No Food Insecurity (01/30/2023)   Hunger Vital Sign    Worried About Running Out of Food in the Last Year: Never true    Ran Out of Food in the Last Year: Never true  Transportation Needs: No Transportation Needs (01/30/2023)   PRAPARE - Administrator, Civil Service (Medical): No    Lack of Transportation (Non-Medical): No  Physical Activity: Sufficiently Active (01/30/2023)   Exercise Vital Sign    Days of Exercise per Week: 7 days    Minutes of Exercise per Session: 30 min  Stress: No Stress Concern Present (01/30/2023)   [employer] of Occupational Health - Occupational Stress Questionnaire    Feeling of Stress : Not at all  Social Connections: Patient Declined (01/30/2023)   Social Connection and Isolation Panel [NHANES]  Frequency of Communication with Friends and Family: Patient declined    Frequency of Social Gatherings with Friends and Family: Patient  declined    Attends Religious Services: Patient declined    Database administrator or Organizations: Patient declined    Attends Engineer, structural: Patient declined    Marital Status: Patient declined    Tobacco Counseling Counseling given: Not Answered   Clinical Intake:  Pre-visit preparation completed: Yes  Pain : 0-10 Pain Score: 5  Pain Type: Chronic pain Pain Location: Neck Pain Orientation: Left Pain Radiating Towards: shoulder Pain Descriptors / Indicators: Aching Pain Onset: More than a month ago Pain Frequency: Constant     Nutritional Status: BMI of 19-24  Normal Nutritional Risks: None Diabetes: No  How often do you need to have someone help you when you read instructions, pamphlets, or other written materials from your doctor or pharmacy?: 1 - Never  Diabetic? no  Interpreter Needed?: No  Information entered by :: NAllen LPN   Activities of Daily Living    01/30/2023   11:16 AM 05/31/2022   11:31 AM  In your present state of health, do you have any difficulty performing the following activities:  Hearing? 1 0  Vision? 0 0  Difficulty concentrating or making decisions? 1 0  Walking or climbing stairs? 0 0  Dressing or bathing? 0 0  Doing errands, shopping? 0 0  Preparing Food and eating ? N N  Using the Toilet? N N  In the past six months, have you accidently leaked urine? Y Y  Do you have problems with loss of bowel control? N N  Managing your Medications? N N  Managing your Finances? N N  Housekeeping or managing your Housekeeping? N N    Patient Care Team: Loyola Mast, MD as PCP - General (Family Medicine) Flo Shanks, MD as Consulting Physician (Otolaryngology) Audie Box, Nadyne Coombes, MD (Inactive) as Consulting Physician (Gynecology) Fransisco Hertz, MD as Consulting Physician (Vascular Surgery) Sallye Lat, MD as Consulting Physician (Ophthalmology) Levert Feinstein, MD as Consulting Physician (Neurology) Truitt Merle, MD as Referring Physician (Gastroenterology) Tonny Bollman, MD as Consulting Physician (Cardiology) Crist Fat, MD as Attending Physician (Urology) Romualdo Bolk, MD as Consulting Physician (Obstetrics and Gynecology) Marguerita Beards, MD as Consulting Physician (Obstetrics and Gynecology) Gaspar Cola, [facility] as Pharmacist (Pharmacist) Marguerita Beards, MD as Consulting Physician (Obstetrics and Gynecology)  Indicate any recent Medical Services you may have received from other than [facility] providers in the past year (date may be approximate).     Assessment:   This is a routine wellness examination for Aubrianna.  Hearing/Vision screen Vision Screening - Comments:: Regular eye exams, [facility]  Dietary issues and exercise activities discussed: Current Exercise Habits: Home exercise routine, Type of exercise: walking, Time (Minutes): 30, Frequency (Times/Week): 7, Weekly Exercise (Minutes/Week): 210   Goals Addressed             This Visit's Progress    Patient Stated       01/30/2023, stay healthy       Depression Screen    01/30/2023   11:16 AM 11/11/2022    8:41 AM 06/18/2022    2:31 PM 02/11/2022    9:43 AM 10/25/2021   10:02 AM 07/26/2021   10:37 AM 02/05/2021   10:16 AM  PHQ 2/9 Scores  PHQ - 2 Score 0 1 4 1  0 2 2  PHQ- 9 Score  4 11 12  2 3 4     Fall Risk    01/30/2023   11:16 AM 11/11/2022    8:13 AM 05/31/2022   11:31 AM 10/25/2021    9:53 AM 02/05/2021    9:34 AM  Fall Risk   Falls in the past year? 0 0 0 0 1  Number falls in past yr: 0 0 0 0 0  Injury with Fall? 0 0 0 0 0  Risk for fall due to : Medication side effect No Fall Risks  No Fall Risks   Follow up Falls prevention discussed;Education provided;Falls evaluation completed Falls evaluation completed  Falls evaluation completed     FALL RISK PREVENTION PERTAINING TO THE HOME:  Any stairs in or around the home? Yes  If so, are there any without handrails? No   Home free of loose throw rugs in walkways, pet beds, electrical cords, etc? Yes  Adequate lighting in your home to reduce risk of falls? Yes   ASSISTIVE DEVICES UTILIZED TO PREVENT FALLS:  Life alert? No  Use of a cane, walker or w/c? No  Grab bars in the bathroom? Yes  Shower chair or bench in shower? No  Elevated toilet seat or a handicapped toilet? Yes   TIMED UP AND GO:  Was the test performed? No .      Cognitive Function:    06/30/2018    9:24 AM  MMSE - Mini Mental State Exam  Orientation to time 5  Orientation to Place 5  Registration 3  Attention/ Calculation 5  Recall 3  Language- name 2 objects 2  Language- repeat 1  Language- follow 3 step command 3  Language- read & follow direction 1  Write a sentence 1  Copy design 1  Total score 30      01/01/2023    9:59 AM 07/02/2022    8:33 AM 12/18/2021    8:00 AM  Montreal Cognitive Assessment   Visuospatial/ Executive (0/5) 2 3 3   Naming (0/3) 3 3 3   Attention: Read list of digits (0/2) 2 2 1   Attention: Read list of letters (0/1) 0 1 1  Attention: Serial 7 subtraction starting at 100 (0/3) 0 1 1  Language: Repeat phrase (0/2) 1 2 2   Language : Fluency (0/1) 1 1 1   Abstraction (0/2) 2 2 2   Delayed Recall (0/5) 4 0 1  Orientation (0/6) 6 6 6   Total 21 21 21       01/30/2023   11:17 AM  6CIT Screen  What Year? 0 points  What month? 0 points  What time? 0 points  Count back from 20 0 points  Months in reverse 0 points  Repeat phrase 0 points  Total Score 0 points    Immunizations Immunization History  Administered Date(s) Administered   Fluad Quad(high Dose 65+) 06/20/2019, 06/20/2020, 04/15/2021, 04/29/2022   Influenza Split 07/29/2011, 06/23/2012   Influenza, High Dose Seasonal PF 06/26/2017, 06/30/2018   Influenza,inj,Quad PF,6+ Mos 05/16/2013, 05/22/2014, 08/01/2015   Td 08/18/1992   Tdap 09/11/2014    TDAP status: Up to date  Flu Vaccine status: Up to date  Pneumococcal vaccine  status: Declined,  Education has been provided regarding the importance of this vaccine but patient still declined. Advised may receive this vaccine at local pharmacy or Health Dept. Aware to provide a copy of the vaccination record if obtained from local pharmacy or Health Dept. Verbalized acceptance and understanding.   Covid-19 vaccine status: Declined, Education has been provided regarding the importance of  this vaccine but patient still declined. Advised may receive this vaccine at local pharmacy or Health Dept.or vaccine clinic. Aware to provide a copy of the vaccination record if obtained from local pharmacy or Health Dept. Verbalized acceptance and understanding.  Qualifies for Shingles Vaccine? Yes   Zostavax completed No   Shingrix Completed?: No.    Education has been provided regarding the importance of this vaccine. Patient has been advised to call insurance company to determine out of pocket expense if they have not yet received this vaccine. Advised may also receive vaccine at local pharmacy or Health Dept. Verbalized acceptance and understanding.  Screening Tests Health Maintenance  Topic Date Due   Hepatitis C Screening  Never done   Medicare Annual Wellness (AWV)  07/01/2019   COVID-19 Vaccine (1) 02/15/2023 (Originally 07/17/1956)   Zoster Vaccines- Shingrix (1 of 2) 05/02/2023 (Originally 07/16/1970)   Pneumonia Vaccine 61+ Years old (1 of 1 - PCV) 01/30/2024 (Originally 07/17/2015)   INFLUENZA VACCINE  03/19/2023   DTaP/Tdap/Td (3 - Td or Tdap) 09/11/2024   MAMMOGRAM  11/19/2024   Colonoscopy  07/10/2025   DEXA SCAN  Completed   HPV VACCINES  Aged Out    Health Maintenance  Health Maintenance Due  Topic Date Due   Hepatitis C Screening  Never done   Medicare Annual Wellness (AWV)  07/01/2019    Colorectal cancer screening: Type of screening: Colonoscopy. Completed 07/10/2020. Repeat every 5 years  Mammogram status: Completed 11/20/2022. Repeat every year  Bone  Density status: Completed 04/24/2020.   Lung Cancer Screening: (Low Dose CT Chest recommended if Age 47-80 years, 30 pack-year currently smoking OR have quit w/in 15years.) does not qualify.   Lung Cancer Screening Referral: no  Additional Screening:  Hepatitis C Screening: does qualify;  Vision Screening: Recommended annual ophthalmology exams for early detection of glaucoma and other disorders of the eye. Is the patient up to date with their annual eye exam?  Yes  Who is the provider or what is the name of the office in which the patient attends annual eye exams? [facility] If pt is not established with a provider, would they like to be referred to a provider to establish care? No .   Dental Screening: Recommended annual dental exams for proper oral hygiene  Community Resource Referral / Chronic Care Management: CRR required this visit?  No   CCM required this visit?  No      Plan:     I have personally reviewed and noted the following in the patient's chart:   Medical and social history Use of alcohol, tobacco or illicit drugs  Current medications and supplements including opioid prescriptions. Patient is not currently taking opioid prescriptions. Functional ability and status Nutritional status Physical activity Advanced directives List of other physicians Hospitalizations, surgeries, and ER visits in previous 12 months Vitals Screenings to include cognitive, depression, and falls Referrals and appointments  In addition, I have reviewed and discussed with patient certain preventive protocols, quality metrics, and best practice recommendations. A written personalized care plan for preventive services as well as general preventive health recommendations were provided to patient.     Barb Merino, LPN   11/24/8117   Nurse Notes: none  Due to this being a virtual visit, the after visit summary with patients personalized plan was offered to patient via mail or  my-chart.  Patient would like to access on my-chart

## 2023-02-04 ENCOUNTER — Encounter: Payer: Self-pay | Admitting: Pharmacist

## 2023-02-04 MED ORDER — DILTIAZEM HCL ER COATED BEADS 180 MG PO CP24: 180.0000 mg | ORAL_CAPSULE | Freq: Every day | ORAL | 5 refills | Status: DC

## 2023-02-11 ENCOUNTER — Other Ambulatory Visit: Payer: Self-pay | Admitting: Physician Assistant

## 2023-02-11 DIAGNOSIS — E041 Nontoxic single thyroid nodule: Secondary | ICD-10-CM

## 2023-02-12 ENCOUNTER — Ambulatory Visit: Payer: Medicare HMO | Admitting: Obstetrics and Gynecology

## 2023-02-18 ENCOUNTER — Ambulatory Visit (HOSPITAL_COMMUNITY): Payer: Medicare HMO | Attending: Cardiovascular Disease

## 2023-02-18 DIAGNOSIS — I071 Rheumatic tricuspid insufficiency: Secondary | ICD-10-CM | POA: Insufficient documentation

## 2023-02-18 DIAGNOSIS — I10 Essential (primary) hypertension: Secondary | ICD-10-CM | POA: Insufficient documentation

## 2023-02-18 DIAGNOSIS — I361 Nonrheumatic tricuspid (valve) insufficiency: Secondary | ICD-10-CM | POA: Diagnosis not present

## 2023-02-18 DIAGNOSIS — E782 Mixed hyperlipidemia: Secondary | ICD-10-CM | POA: Diagnosis not present

## 2023-02-18 LAB — ECHOCARDIOGRAM COMPLETE
Area-P 1/2: 5.97 cm2
S' Lateral: 2.1 cm

## 2023-02-23 ENCOUNTER — Encounter: Payer: Self-pay | Admitting: Pharmacist

## 2023-02-23 ENCOUNTER — Encounter: Payer: Self-pay | Admitting: Obstetrics and Gynecology

## 2023-02-23 ENCOUNTER — Ambulatory Visit: Payer: Medicare HMO | Admitting: Obstetrics and Gynecology

## 2023-02-23 VITALS — BP 151/82 | HR 76

## 2023-02-23 DIAGNOSIS — R339 Retention of urine, unspecified: Secondary | ICD-10-CM | POA: Diagnosis not present

## 2023-02-23 DIAGNOSIS — M62838 Other muscle spasm: Secondary | ICD-10-CM

## 2023-02-23 NOTE — Patient Instructions (Signed)
Use vitamin E cream or coconut oil nightly so you give the vagina some lubrication. You do not want the tissue to thin more and risk mesh erosion.

## 2023-02-23 NOTE — Progress Notes (Signed)
[facility] Return Visit  SUBJECTIVE  History of Present Illness: Megan [name] is a 72 y.o. female seen in follow-up for pelvic floor pain/levator spasm. Plan at last visit was start flexeril 5mg  up to 3 times daily. She has been taking 5mg  at lunch and 5mg  prior to going to sleep. She reports she has not had any further issues with retention. She reports she is still having bowel issues.   Also reports he is having dental pain due to an abscess in the tooth.    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 difficile diarrhea, Cancer (HCC), Chest pain, atypical (07/14/2011), Cold sore (07/17/2016), Colon polyp, Contact dermatitis (03/23/2012), Cystocele, midline, Depression, Diarrhea, Diverticulosis, Endometrial hyperplasia (02/27/2006), GERD (gastroesophageal reflux disease), Headache (04/16/2015), Headache, Headache(784.0) (04/15/2012), Heart murmur, Hematuria (04/27/2012), Hepatic cyst, Hiatal hernia, Hiatal hernia, Hyperlipidemia, mixed (12/17/2014), Hypertension, IBS (irritable bowel syndrome), Insomnia, Leaky heart valve, Low back pain (08/15/2013), Osteoporosis (03/2017), Pancreatic cyst (04/19/2013), Paronychia of great toe, left (06/19/2013), Preventative health care (09/11/2014), Rectal bleeding (10/10/2011), Rectocele, 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   Past Surgical History: She  has a past surgical history that includes Appendectomy; Uterine fibroid surgery; Hysteroscopy; Cardiac  catheterization (Left, 07/24/2016); Upper gi endoscopy; endoscopic hemoclip; eye surgery (Left); Open reduction internal fixation (orif) distal radial fracture (Left, 01/17/2019); Robotic assisted laparoscopic sacrocolpopexy (Bilateral, 11/15/2020); Robotic assisted bilateral salpingo oophorectomy (11/15/2020); Robotic assisted supracervical hysterectomy (11/15/2020); and RIGHT HEART CATH (N/A, 01/01/2022).   Medications: She has a current medication list which includes the following prescription(s): albuterol, alprazolam, calcium & magnesium carbonates, alum hydroxide-mag carbonate, aspirin ec, biotin, cholecalciferol, clindamycin, cyclobenzaprine, dicyclomine, diltiazem, repatha sureclick, famotidine-calcium carbonate-magnesium hydroxide, fluticasone, gabapentin, metoprolol succinate, multivitamin, ondansetron, promethazine, tenapanor hcl, trazodone, ubrelvy, vitamin e, duloxetine, and estradiol.   Allergies: Patient is allergic to sulfamethoxazole, amitriptyline, atorvastatin, cymbalta [duloxetine hcl], ezetimibe, lactose intolerance (gi), lexapro [escitalopram oxalate], rosuvastatin, topamax [topiramate], valsartan, and prednisone.   Social History: Patient  reports that she quit smoking about 27 years ago. Her smoking use included cigarettes. She has never used smokeless tobacco. She reports that she does not drink alcohol and does not use drugs.      OBJECTIVE     Physical Exam: Vitals:   02/23/23 1112  BP: (!) 151/82  Pulse: 76   Gen: No apparent distress, A&O x 3.  Detailed Urogynecologic Evaluation:  Deferred.    ASSESSMENT AND PLAN    Megan [name] is a 72 y.o. with:  1. Levator spasm   2. Urinary retention    Patient is using the Flexeril twice daily with improvement in her muscle pain. She is still unable to do the pelvic floor Pt. She would like to continue on the flexeril.  Has not had any more urinary rentention and reports she has been doing well regarding this.    Patient to follow up in 6 months or sooner if needed.

## 2023-02-24 ENCOUNTER — Ambulatory Visit (INDEPENDENT_AMBULATORY_CARE_PROVIDER_SITE_OTHER): Payer: Medicare HMO

## 2023-02-24 ENCOUNTER — Encounter: Payer: Self-pay | Admitting: Physician Assistant

## 2023-02-24 ENCOUNTER — Ambulatory Visit: Payer: Medicare HMO | Attending: Physician Assistant | Admitting: Physician Assistant

## 2023-02-24 ENCOUNTER — Ambulatory Visit (INDEPENDENT_AMBULATORY_CARE_PROVIDER_SITE_OTHER): Payer: Medicare HMO | Admitting: Family Medicine

## 2023-02-24 VITALS — BP 140/72 | HR 84 | Temp 98.2°F | Ht 66.0 in | Wt 149.8 lb

## 2023-02-24 VITALS — BP 160/80 | HR 82 | Ht 66.0 in | Wt 151.6 lb

## 2023-02-24 DIAGNOSIS — I10 Essential (primary) hypertension: Secondary | ICD-10-CM

## 2023-02-24 DIAGNOSIS — R002 Palpitations: Secondary | ICD-10-CM

## 2023-02-24 DIAGNOSIS — R232 Flushing: Secondary | ICD-10-CM | POA: Insufficient documentation

## 2023-02-24 DIAGNOSIS — I2510 Atherosclerotic heart disease of native coronary artery without angina pectoris: Secondary | ICD-10-CM | POA: Diagnosis not present

## 2023-02-24 DIAGNOSIS — I700 Atherosclerosis of aorta: Secondary | ICD-10-CM

## 2023-02-24 DIAGNOSIS — K2950 Unspecified chronic gastritis without bleeding: Secondary | ICD-10-CM

## 2023-02-24 DIAGNOSIS — I071 Rheumatic tricuspid insufficiency: Secondary | ICD-10-CM | POA: Diagnosis not present

## 2023-02-24 DIAGNOSIS — K047 Periapical abscess without sinus: Secondary | ICD-10-CM

## 2023-02-24 DIAGNOSIS — K581 Irritable bowel syndrome with constipation: Secondary | ICD-10-CM | POA: Diagnosis not present

## 2023-02-24 HISTORY — DX: Atherosclerotic heart disease of native coronary artery without angina pectoris: I25.10

## 2023-02-24 MED ORDER — PANTOPRAZOLE SODIUM 40 MG PO TBEC
40.00 mg | DELAYED_RELEASE_TABLET | Freq: Every day | ORAL | 3 refills | Status: AC
Start: 2023-02-24 — End: ?

## 2023-02-24 MED ORDER — ONDANSETRON HCL 4 MG PO TABS: ORAL_TABLET | ORAL | 3 refills | Status: DC

## 2023-02-24 MED ORDER — LINZESS 290 MCG PO CAPS
290.0000 ug | ORAL_CAPSULE | Freq: Every day | ORAL | 3 refills | Status: DC
Start: 2023-02-24 — End: 2023-08-17

## 2023-02-24 MED ORDER — AMOXICILLIN-POT CLAVULANATE 875-125 MG PO TABS
1.0000 | ORAL_TABLET | Freq: Two times a day (BID) | ORAL | 0 refills | Status: DC
Start: 2023-02-24 — End: 2023-03-31

## 2023-02-24 MED ORDER — SPIRONOLACTONE 25 MG PO TABS
12.5000 mg | ORAL_TABLET | Freq: Every day | ORAL | 3 refills | Status: DC
Start: 2023-02-24 — End: 2023-08-17

## 2023-02-24 NOTE — Progress Notes (Signed)
Cardiology Office Note:    Date:  02/24/2023  ID:  Megan [name], DOB 04/21/1951, MRN [id number] PCP: Loyola Mast, MD  [facility] Providers Cardiologist:  None       Patient Profile:      Coronary artery disease  Myoview 07/09/2016: EF 83, low risk Nonobstructive CAD >> CCTA 12/26/2021: CAC score 115 (72nd percentile); RCA mid and distal <25, LAD proximal and mid 25-49, mid-distal 50-69, LCx proximal 50-69, ascending aorta 33 mm; FFR normal-medical therapy Palpitations  Monitor 01/03/20: no significant arrhythmias  Monitor 12/2021: No A-fib, no PVCs, short SVT runs (11 seconds) Mild to mod TR TTE 12/04/2021: EF 60-65, normal RVSF, moderately elevated PASP, RVSP 52.1, trivial MR, mild to moderate TR, tricuspid regurgitant velocity 3.32 m/s  RHC 01/01/2022: Normal PA pressures, no evidence of pulmonary hypertension, PCWP 5, CO 5.5, CI 3.2 TTE 02/18/2023: EF 60-65, no RWMA, mild LVH, normal RVSF, normal PASP, no MR, mild TR Hypertension  Hyperlipidemia  Intol of statins  GERD  Hypothyroidism       History of Present Illness:   Megan [name] is a 72 y.o. female who returns for follow-up of CAD, tricuspid regurgitation.  She had right heart catheterization last year to evaluate for pulmonary hypertension noted on echocardiogram.  PA pressures were normal on right heart cath.  CCTA demonstrated nonobstructive CAD.  She was last seen by Dr. Excell Seltzer in July 2023.  Follow-up echocardiogram last week demonstrated just mild tricuspid regurgitation.  She is here alone.  Over the past month, she has noted hot flashes.  These occur periodically.  She sometimes notices them at night.  It is associated with significant diaphoresis and palpitations.  They have been more frequent in the past couple of weeks.  She has fluctuating blood pressures at home.  She has had some elevated blood pressures with these hot flashes.  However, it is not always elevated with them.  She has already gone  through menopause.  She did see ENT recently for some left-sided neck and ear pain.  CT scan demonstrated 18 mm left thyroid nodule.  She has a lot of chronic chest discomfort related to GI issues.  She had some lesion excised from her stomach in the past.  She has not had exertional chest discomfort.  She sleeps on 3 pillows secondary to acid reflux.  She has some mild pedal edema.  Review of Systems  Constitutional: Positive for night sweats. Negative for fever and weight gain.  Respiratory:  Positive for cough. Negative for hemoptysis and sputum production.   Gastrointestinal:  Positive for constipation and hematochezia (chronic hx related to constipation).   see the HPI    Studies Reviewed:        Risk Assessment/Calculations:     HYPERTENSION CONTROL Vitals:   02/24/23 1509 02/24/23 1733  BP: (!) 148/61 (!) 160/80    The patient's blood pressure is elevated above target today.  In order to address the patient's elevated BP: A new medication was prescribed today.       Physical Exam:   VS:  BP (!) 160/80   Pulse 82   Ht 5\' 6"  (1.676 m)   Wt 151 lb 9.6 oz (68.8 kg)   SpO2 98%   BMI 24.47 kg/m    Wt Readings from Last 3 Encounters:  02/24/23 151 lb 9.6 oz (68.8 kg)  02/24/23 149 lb 12.8 oz (67.9 kg)  01/30/23 146 lb (66.2 kg)    Constitutional:  Appearance: Healthy appearance. Not in distress.  Neck:     Vascular: JVD normal.  Pulmonary:     Breath sounds: Normal breath sounds. No wheezing. No rales.  Cardiovascular:     Normal rate. Regular rhythm.     Murmurs: There is no murmur.  Edema:    Peripheral edema absent.  Abdominal:     Palpations: Abdomen is soft.       ASSESSMENT AND PLAN:   CAD (coronary artery disease) Nonobstructive CAD by CT in May 2023.  Recent echocardiogram demonstrated normal LV function.  She has not had exertional chest symptoms.  She is not having symptoms consistent with angina.  Continue ASA 81 mg daily, Repatha 140 mg every 14  days.  Tricuspid regurgitation, Mild-Moderate Recent follow-up echocardiogram with mild tricuspid regurgitation.  Continue periodic surveillance.  Abdominal aortic atherosclerosis (HCC) Continue ASA 81 mg daily, Repatha.  Essential hypertension She has had fluctuating blood pressures.  She also notes recent hot flashes, palpitations.  However, her symptoms do not sound consistent with pheochromocytoma symptoms.  If she continues to have symptoms with unremarkable workup in addition to uncontrolled blood pressures, we can certainly consider obtaining catecholamines, metanephrines.  She was seen in the emergency room back in May.  There was a question about urinary retention.  She took 1 dose of hydrochlorothiazide before leaving the house to go to the emergency room.  She had the ability to urinate sometime after arriving to the emergency room.  However, her HCTZ was stopped around that time.  She had a low potassium.  Obtain follow-up CMET today.  Start spironolactone 12.5 mg daily.  Obtain BMET weekly x 2.  Continue Cardizem 180 mg daily, Toprol-XL 25 mg daily follow-up 1 month.  Hot flashes Etiology not entirely clear.  She does have a thyroid nodule noted on CT recently.  She has a thyroid ultrasound pending.  Question if this is all related.  Obtain CMET, CBC, TSH, free T4.  I have encouraged her to follow-up with primary care as well.  Palpitations Since she has a thyroid nodule and palpitations, there is certainly some concern for atrial fibrillation.  She last wore a monitor over a year ago.  Obtain TSH, free T4 as noted.  Arrange 14-day ZIO XT to screen for atrial fibrillation.    Dispo:  Return in about 4 weeks (around 03/24/2023) for Routine Follow Up, w/ Tereso Newcomer, PA-C.  Signed, Tereso Newcomer, PA-C

## 2023-02-24 NOTE — Assessment & Plan Note (Signed)
Continue ASA 81 mg daily, Repatha.

## 2023-02-24 NOTE — Assessment & Plan Note (Signed)
She has had fluctuating blood pressures.  She also notes recent hot flashes, palpitations.  However, her symptoms do not sound consistent with pheochromocytoma symptoms.  If she continues to have symptoms with unremarkable workup in addition to uncontrolled blood pressures, we can certainly consider obtaining catecholamines, metanephrines.  She was seen in the emergency room back in May.  There was a question about urinary retention.  She took 1 dose of hydrochlorothiazide before leaving the house to go to the emergency room.  She had the ability to urinate sometime after arriving to the emergency room.  However, her HCTZ was stopped around that time.  She had a low potassium.  Obtain follow-up CMET today.  Start spironolactone 12.5 mg daily.  Obtain BMET weekly x 2.  Continue Cardizem 180 mg daily, Toprol-XL 25 mg daily follow-up 1 month.

## 2023-02-24 NOTE — Patient Instructions (Addendum)
Medication Instructions:  Your physician has recommended you make the following change in your medication:  START Spironolactone 25mg  taking 1/2 (one half) tablet daily.    *If you need a refill on your cardiac medications before your next appointment, please call your pharmacy*   Lab Work: CMET, CBC, TSH, and FT4 today. BMET in 1 week. BMET in 2 weeks.  If you have labs (blood work) drawn today and your tests are completely normal, you will receive your results only by: MyChart Message (if you have MyChart) OR A paper copy in the mail If you have any lab test that is abnormal or we need to change your treatment, we will call you to review the results.   Testing/Procedures: Christena Deem- Long Term Monitor Instructions  Your physician has requested you wear a ZIO patch monitor for 14 days.  This is a single patch monitor. [employer] supplies one patch monitor per enrollment. Additional stickers are not available. Please do not apply patch if you will be having a Nuclear Stress Test,  Echocardiogram, Cardiac CT, MRI, or Chest Xray during the period you would be wearing the  monitor. The patch cannot be worn during these tests. You cannot remove and re-apply the  ZIO XT patch monitor.  Your ZIO patch monitor will be mailed 3 day USPS to your address on file. It may take 3-5 days  to receive your monitor after you have been enrolled.  Once you have received your monitor, please review the enclosed instructions. Your monitor  has already been registered assigning a specific monitor serial # to you.  Billing and Patient Assistance Program Information  We have supplied [employer] with any of your insurance information on file for billing purposes. [employer] offers a sliding scale Patient Assistance Program for patients that do not have  insurance, or whose insurance does not completely cover the cost of the ZIO monitor.  You must apply for the Patient Assistance Program to qualify for this  discounted rate.  To apply, please call [employer] at [phone], select option 4, select option 2, ask to apply for  Patient Assistance Program. Meredeth Ide will ask your household income, and how many people  are in your household. They will quote your out-of-pocket cost based on that information.  [employer] will also be able to set up a 85-month, interest-free payment plan if needed.  Applying the monitor   Shave hair from upper left chest.  Hold abrader disc by orange tab. Rub abrader in 40 strokes over the upper left chest as  indicated in your monitor instructions.  Clean area with 4 enclosed alcohol pads. Let dry.  Apply patch as indicated in monitor instructions. Patch will be placed under collarbone on left  side of chest with arrow pointing upward.  Rub patch adhesive wings for 2 minutes. Remove white label marked "1". Remove the white  label marked "2". Rub patch adhesive wings for 2 additional minutes.  While looking in a mirror, press and release button in center of patch. A small green light will  flash 3-4 times. This will be your only indicator that the monitor has been turned on.  Do not shower for the first 24 hours. You may shower after the first 24 hours.  Press the button if you feel a symptom. You will hear a small click. Record Date, Time and  Symptom in the Patient Logbook.  When you are ready to remove the patch, follow instructions on the last 2 pages of Patient  Logbook.  Stick patch monitor onto the last page of Patient Logbook.  Place Patient Logbook in the blue and white box. Use locking tab on box and tape box closed  securely. The blue and white box has prepaid postage on it. Please place it in the mailbox as  soon as possible. Your physician should have your test results approximately 7 days after the  monitor has been mailed back to [facility].  Call [facility] Customer Care at [phone] if you have questions regarding  your ZIO XT patch monitor. Call  them immediately if you see an orange light blinking on your  monitor.  If your monitor falls off in less than 4 days, contact our Monitor department at [phone].  If your monitor becomes loose or falls off after 4 days call [employer] at [phone] for  suggestions on securing your monitor    Follow-Up: At [facility], you and your health needs are our priority.  As part of our continuing mission to provide you with exceptional heart care, we have created designated Provider Care Teams.  These Care Teams include your primary Cardiologist (physician) and Advanced Practice Providers (APPs -  Physician Assistants and Nurse Practitioners) who all work together to provide you with the care you need, when you need it.  We recommend signing up for the patient portal called "MyChart".  Sign up information is provided on this After Visit Summary.  MyChart is used to connect with patients for Virtual Visits (Telemedicine).  Patients are able to view lab/test results, encounter notes, upcoming appointments, etc.  Non-urgent messages can be sent to your provider as well.   To learn more about what you can do with MyChart, go to [email or web address].    Your next appointment:   1 month(s)  Provider:   Tereso Newcomer, PA-C         Other Instructions Your physician has requested that you regularly monitor and record your blood pressure readings at home. Please use the same machine at the same time of day to check your readings and record them.    Please monitor blood pressures and keep a log of your readings for 2 weeks. Please send through MyChart.    Make sure to check 2 hours after your medications.    AVOID these things for 30 minutes before checking your blood pressure: No Drinking caffeine. No Drinking alcohol. No Eating. No Smoking. No Exercising.   Five minutes before checking your blood pressure: Pee. Sit in a dining chair. Avoid sitting in a soft couch or armchair. Be  quiet. Do not talk

## 2023-02-24 NOTE — Assessment & Plan Note (Signed)
Recent follow-up echocardiogram with mild tricuspid regurgitation.  Continue periodic surveillance.

## 2023-02-24 NOTE — Assessment & Plan Note (Signed)
Since she has a thyroid nodule and palpitations, there is certainly some concern for atrial fibrillation.  She last wore a monitor over a year ago.  Obtain TSH, free T4 as noted.  Arrange 14-day ZIO XT to screen for atrial fibrillation.

## 2023-02-24 NOTE — Assessment & Plan Note (Signed)
I suspect her chronic cough may be due to GERD issues. I will start her on a PPI and see if this improves.

## 2023-02-24 NOTE — Assessment & Plan Note (Signed)
I will renew her Linzess and Zofran. Megan [name]'s symptoms appear to wax and wane based on her anxiety and stress level. I am tryign to be careful about not over medicating her symptoms.
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
## 2023-02-24 NOTE — Progress Notes (Unsigned)
Enrolled patient for a 14 day Zio XT monitor to be mailed to patients home   Cooper to read 

## 2023-02-24 NOTE — Assessment & Plan Note (Signed)
Etiology not entirely clear.  She does have a thyroid nodule noted on CT recently.  She has a thyroid ultrasound pending.  Question if this is all related.  Obtain CMET, CBC, TSH, free T4.  I have encouraged her to follow-up with primary care as well.

## 2023-02-24 NOTE — Assessment & Plan Note (Signed)
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  Recent echocardiogram demonstrated normal LV function.  She has not had exertional chest symptoms.  She is not having symptoms consistent with angina.  Continue ASA 81 mg daily, Repatha 140 mg every 14 days.

## 2023-02-25 ENCOUNTER — Encounter: Payer: Self-pay | Admitting: Family Medicine

## 2023-02-26 LAB — COMPREHENSIVE METABOLIC PANEL
ALT: 14 IU/L (ref 0–32)
AST: 13 IU/L (ref 0–40)
Albumin: 4.2 g/dL (ref 3.8–4.8)
Alkaline Phosphatase: 116 IU/L (ref 44–121)
BUN/Creatinine Ratio: 11 — ABNORMAL LOW (ref 12–28)
BUN: 7 mg/dL — ABNORMAL LOW (ref 8–27)
Bilirubin Total: <0.2 mg/dL (ref 0.0–1.2)
CO2: 21 mmol/L (ref 20–29)
Calcium: 9.7 mg/dL (ref 8.7–10.3)
Chloride: 98 mmol/L (ref 96–106)
Creatinine, Ser: 0.65 mg/dL (ref 0.57–1.00)
Globulin, Total: 2.9 g/dL (ref 1.5–4.5)
Glucose: 109 mg/dL — ABNORMAL HIGH (ref 70–99)
Potassium: 3.9 mmol/L (ref 3.5–5.2)
Sodium: 138 mmol/L (ref 134–144)
Total Protein: 7.1 g/dL (ref 6.0–8.5)
eGFR: 93 mL/min/{1.73_m2} (ref >59)

## 2023-02-26 LAB — CBC
Hematocrit: 38.1 % (ref 34.0–46.6)
Hemoglobin: 12.9 g/dL (ref 11.1–15.9)
MCH: 31.4 pg (ref 26.6–33.0)
MCHC: 33.9 g/dL (ref 31.5–35.7)
MCV: 93 fL (ref 79–97)
Platelets: 351 10*3/uL (ref 150–450)
RBC: 4.11 x10E6/uL (ref 3.77–5.28)
RDW: 12 % (ref 11.7–15.4)
WBC: 16.2 10*3/uL — ABNORMAL HIGH (ref 3.4–10.8)

## 2023-02-26 LAB — T4, FREE: Free T4: 1.09 ng/dL (ref 0.82–1.77)

## 2023-02-26 LAB — TSH: TSH: 0.873 u[IU]/mL (ref 0.450–4.500)

## 2023-02-27 ENCOUNTER — Ambulatory Visit
Admission: RE | Admit: 2023-02-27 | Discharge: 2023-02-27 | Disposition: A | Discharge status: Home / Self Care | Payer: Medicare HMO | Source: Ambulatory Visit | Attending: Physician Assistant | Admitting: Physician Assistant

## 2023-02-27 DIAGNOSIS — E042 Nontoxic multinodular goiter: Secondary | ICD-10-CM | POA: Diagnosis not present

## 2023-02-27 DIAGNOSIS — E041 Nontoxic single thyroid nodule: Secondary | ICD-10-CM

## 2023-02-28 DIAGNOSIS — I2510 Atherosclerotic heart disease of native coronary artery without angina pectoris: Secondary | ICD-10-CM | POA: Diagnosis not present

## 2023-02-28 DIAGNOSIS — R002 Palpitations: Secondary | ICD-10-CM

## 2023-02-28 DIAGNOSIS — I071 Rheumatic tricuspid insufficiency: Secondary | ICD-10-CM | POA: Diagnosis not present

## 2023-03-03 ENCOUNTER — Other Ambulatory Visit: Payer: Self-pay | Admitting: Family Medicine

## 2023-03-03 ENCOUNTER — Ambulatory Visit: Payer: Medicare HMO | Attending: Physician Assistant

## 2023-03-03 DIAGNOSIS — F418 Other specified anxiety disorders: Secondary | ICD-10-CM

## 2023-03-03 DIAGNOSIS — I2510 Atherosclerotic heart disease of native coronary artery without angina pectoris: Secondary | ICD-10-CM

## 2023-03-03 DIAGNOSIS — I071 Rheumatic tricuspid insufficiency: Secondary | ICD-10-CM | POA: Diagnosis not present

## 2023-03-03 DIAGNOSIS — R002 Palpitations: Secondary | ICD-10-CM

## 2023-03-03 NOTE — Telephone Encounter (Signed)
Refill request for  Alprazolam 1 mg LR  12/30/22, #45, 1 rf LOV  02/24/23 FOV    none scheduled.     Please review and advise.  Thanks. Dm/cma

## 2023-03-04 LAB — BASIC METABOLIC PANEL
BUN/Creatinine Ratio: 10 — ABNORMAL LOW (ref 12–28)
BUN: 7 mg/dL — ABNORMAL LOW (ref 8–27)
CO2: 22 mmol/L (ref 20–29)
Calcium: 10.4 mg/dL — ABNORMAL HIGH (ref 8.7–10.3)
Chloride: 97 mmol/L (ref 96–106)
Creatinine, Ser: 0.73 mg/dL (ref 0.57–1.00)
Glucose: 131 mg/dL — ABNORMAL HIGH (ref 70–99)
Potassium: 4.7 mmol/L (ref 3.5–5.2)
Sodium: 136 mmol/L (ref 134–144)
eGFR: 87 mL/min/{1.73_m2} (ref >59)

## 2023-03-10 ENCOUNTER — Ambulatory Visit: Payer: Medicare HMO | Attending: Physician Assistant

## 2023-03-10 DIAGNOSIS — I2510 Atherosclerotic heart disease of native coronary artery without angina pectoris: Secondary | ICD-10-CM | POA: Diagnosis not present

## 2023-03-10 DIAGNOSIS — I071 Rheumatic tricuspid insufficiency: Secondary | ICD-10-CM | POA: Diagnosis not present

## 2023-03-10 DIAGNOSIS — R002 Palpitations: Secondary | ICD-10-CM

## 2023-03-10 LAB — BASIC METABOLIC PANEL
BUN/Creatinine Ratio: 11 — ABNORMAL LOW (ref 12–28)
BUN: 8 mg/dL (ref 8–27)
CO2: 26 mmol/L (ref 20–29)
Calcium: 9.9 mg/dL (ref 8.7–10.3)
Chloride: 101 mmol/L (ref 96–106)
Creatinine, Ser: 0.7 mg/dL (ref 0.57–1.00)
Glucose: 107 mg/dL — ABNORMAL HIGH (ref 70–99)
Potassium: 4.6 mmol/L (ref 3.5–5.2)
Sodium: 136 mmol/L (ref 134–144)
eGFR: 92 mL/min/{1.73_m2} (ref >59)

## 2023-03-11 ENCOUNTER — Encounter: Payer: Self-pay | Admitting: Family Medicine

## 2023-03-11 DIAGNOSIS — D72829 Elevated white blood cell count, unspecified: Secondary | ICD-10-CM

## 2023-03-12 ENCOUNTER — Other Ambulatory Visit (INDEPENDENT_AMBULATORY_CARE_PROVIDER_SITE_OTHER): Payer: Medicare HMO

## 2023-03-12 DIAGNOSIS — D72829 Elevated white blood cell count, unspecified: Secondary | ICD-10-CM | POA: Diagnosis not present

## 2023-03-12 NOTE — Addendum Note (Signed)
Addended by: Lindley Magnus L on: 03/12/2023 02:44 PM   Modules accepted: Orders

## 2023-03-13 ENCOUNTER — Other Ambulatory Visit: Payer: Self-pay | Admitting: Obstetrics and Gynecology

## 2023-03-13 ENCOUNTER — Encounter: Payer: Self-pay | Admitting: Family Medicine

## 2023-03-13 ENCOUNTER — Encounter: Payer: Self-pay | Admitting: Obstetrics and Gynecology

## 2023-03-13 LAB — CBC WITH DIFFERENTIAL/PLATELET: Hemoglobin: 14.4 g/dL (ref 12.0–15.0)

## 2023-03-17 DIAGNOSIS — R002 Palpitations: Secondary | ICD-10-CM | POA: Diagnosis not present

## 2023-03-17 DIAGNOSIS — I071 Rheumatic tricuspid insufficiency: Secondary | ICD-10-CM | POA: Diagnosis not present

## 2023-03-20 ENCOUNTER — Encounter: Payer: Self-pay | Admitting: Physician Assistant

## 2023-03-23 NOTE — Progress Notes (Deleted)
Cardiology Office Note:    Date:  03/23/2023  ID:  Megan [name], DOB 01/22/1951, MRN [id number] PCP: Loyola Mast, MD  [facility] Providers Cardiologist:  None { Click to update primary MD,subspecialty MD or APP then REFRESH:1}    {Click to Open Review  :1}   Patient Profile:      Coronary artery disease  Myoview 07/09/2016: EF 83, low risk Nonobstructive CAD >> CCTA 12/26/2021: CAC score 115 (72nd percentile); RCA mid and distal <25, LAD proximal and mid 25-49, mid-distal 50-69, LCx proximal 50-69, ascending aorta 33 mm; FFR normal-medical therapy Palpitations  Monitor 01/03/20: no significant arrhythmias  Monitor 12/2021: No A-fib, no PVCs, short SVT runs (11 seconds) Monitor 02/2023: NSR, no atrial fibrillation, PACs 1.6%, 87 brief SVT runs (longest 19 beats) Mild to mod TR TTE 12/04/2021: EF 60-65, normal RVSF, moderately elevated PASP, RVSP 52.1, trivial MR, mild to moderate TR, tricuspid regurgitant velocity 3.32 m/s  RHC 01/01/2022: Normal PA pressures, no evidence of pulmonary hypertension, PCWP 5, CO 5.5, CI 3.2 TTE 02/18/2023: EF 60-65, no RWMA, mild LVH, normal RVSF, normal PASP, no MR, mild TR Hypertension  Hyperlipidemia  Intol of statins  GERD  Hypothyroidism        { Last seen 02/24/23 Pt c/o hot flashes, palpitations, diaphoresis Has recent dx of thyroid nodule Past hx of noncardiac chest pain - prob GI I started on Spiro for BP  Monitor with PACs and brief Supraventricular Tachycardia runs TSH, T4 normal, WBC high - pt noted recent dental infection - poss cause of symptoms     :1}     Discussed the use of AI scribe software for clinical note transcription with the patient, who gave verbal consent to proceed.  History of Present Illness         ROS: ***    Studies Reviewed:       *** Risk Assessment/Calculations:   {Does this patient have ATRIAL FIBRILLATION?:[phone]} No BP recorded.  {Refresh Note OR Click here to enter BP  :1}***        Physical Exam:   VS:  There were no vitals taken for this visit.   Wt Readings from Last 3 Encounters:  02/24/23 151 lb 9.6 oz (68.8 kg)  02/24/23 149 lb 12.8 oz (67.9 kg)  01/30/23 146 lb (66.2 kg)    GEN: Well nourished, well developed in no acute distress*** NECK: No JVD CARDIAC: ***RRR, no murmurs, rubs, gallops RESPIRATORY:  Clear to auscultation without rales, wheezing or rhonchi  ABDOMEN: Soft EXTREMITIES:  No edema     Assessment and Plan:  No problem-specific Assessment & Plan notes found for this encounter. 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  Recent echocardiogram demonstrated normal LV function.  She has not had exertional chest symptoms.  She is not having symptoms consistent with angina.  Continue ASA 81 mg daily, Repatha 140 mg every 14 days.   Tricuspid regurgitation, Mild-Moderate Recent follow-up echocardiogram with mild tricuspid regurgitation.  Continue periodic surveillance.   Abdominal aortic atherosclerosis (HCC) Continue ASA 81 mg daily, Repatha.   Essential hypertension She has had fluctuating blood pressures.  She also notes recent hot flashes, palpitations.  However, her symptoms do not sound consistent with pheochromocytoma symptoms.  If she continues to have symptoms with unremarkable workup in addition to uncontrolled blood pressures, we can certainly consider obtaining catecholamines, metanephrines.  She was seen  in the emergency room back in May.  There was a question about urinary retention.  She took 1 dose of hydrochlorothiazide before leaving the house to go to the emergency room.  She had the ability to urinate sometime after arriving to the emergency room.  However, her HCTZ was stopped around that time.  She had a low potassium.  Obtain follow-up CMET today.  Start spironolactone 12.5 mg daily.  Obtain BMET weekly x 2.  Continue Cardizem 180 mg daily, Toprol-XL 25 mg daily follow-up 1  month.   Hot flashes Etiology not entirely clear.  She does have a thyroid nodule noted on CT recently.  She has a thyroid ultrasound pending.  Question if this is all related.  Obtain CMET, CBC, TSH, free T4.  I have encouraged her to follow-up with primary care as well.   Palpitations Since she has a thyroid nodule and palpitations, there is certainly some concern for atrial fibrillation.  She last wore a monitor over a year ago.  Obtain TSH, free T4 as noted.  Arrange 14-day ZIO XT to screen for atrial fibrillation.      :1}    {Are you ordering a CV Procedure (e.g. stress test, cath, DCCV, TEE, etc)?   Press F2        :[id number]}  Dispo:  No follow-ups on file.  Signed, Tereso Newcomer, PA-C

## 2023-03-24 ENCOUNTER — Ambulatory Visit: Payer: Medicare HMO | Admitting: Physician Assistant

## 2023-03-31 ENCOUNTER — Ambulatory Visit: Payer: Medicare HMO | Admitting: Family Medicine

## 2023-03-31 VITALS — BP 138/76 | HR 86 | Temp 97.9°F | Ht 66.0 in | Wt 146.8 lb

## 2023-03-31 DIAGNOSIS — F418 Other specified anxiety disorders: Secondary | ICD-10-CM | POA: Diagnosis not present

## 2023-03-31 MED ORDER — VENLAFAXINE HCL ER 37.5 MG PO CP24: 37.5000 mg | ORAL_CAPSULE | Freq: Every day | ORAL | 3 refills | Status: DC
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
## 2023-03-31 NOTE — Assessment & Plan Note (Signed)
Continue alprazolam #45 per month. I will try her on venlafaxine 37.5 mg daily. I encouraged her to give any side effects a chance to resolve. I will see her back in 6 weeks for reassessment.

## 2023-04-01 ENCOUNTER — Encounter: Payer: Self-pay | Admitting: Family Medicine

## 2023-04-01 ENCOUNTER — Telehealth: Payer: Self-pay | Admitting: Neurology

## 2023-04-01 DIAGNOSIS — F418 Other specified anxiety disorders: Secondary | ICD-10-CM

## 2023-04-01 MED ORDER — FLUOXETINE HCL 10 MG PO TABS
10.0000 mg | ORAL_TABLET | Freq: Every day | ORAL | 3 refills | Status: DC
Start: 2023-04-01 — End: 2023-12-07

## 2023-04-01 NOTE — Telephone Encounter (Signed)
 LVM and sent mychart msg informing pt of need to reschedule 07/15/23 appt - NP out

## 2023-04-02 ENCOUNTER — Encounter (INDEPENDENT_AMBULATORY_CARE_PROVIDER_SITE_OTHER): Payer: Self-pay

## 2023-04-08 DIAGNOSIS — R1319 Other dysphagia: Secondary | ICD-10-CM | POA: Diagnosis not present

## 2023-04-08 DIAGNOSIS — M7918 Myalgia, other site: Secondary | ICD-10-CM | POA: Diagnosis not present

## 2023-04-08 DIAGNOSIS — K5904 Chronic idiopathic constipation: Secondary | ICD-10-CM | POA: Diagnosis not present

## 2023-04-08 DIAGNOSIS — D490 Neoplasm of unspecified behavior of digestive system: Secondary | ICD-10-CM | POA: Diagnosis not present

## 2023-04-08 DIAGNOSIS — R1013 Epigastric pain: Secondary | ICD-10-CM | POA: Diagnosis not present

## 2023-04-08 DIAGNOSIS — R61 Generalized hyperhidrosis: Secondary | ICD-10-CM | POA: Diagnosis not present

## 2023-04-08 DIAGNOSIS — K219 Gastro-esophageal reflux disease without esophagitis: Secondary | ICD-10-CM | POA: Diagnosis not present

## 2023-04-08 DIAGNOSIS — R531 Weakness: Secondary | ICD-10-CM | POA: Diagnosis not present

## 2023-04-08 DIAGNOSIS — C7A010 Malignant carcinoid tumor of the duodenum: Secondary | ICD-10-CM | POA: Diagnosis not present

## 2023-04-08 DIAGNOSIS — K5710 Diverticulosis of small intestine without perforation or abscess without bleeding: Secondary | ICD-10-CM | POA: Diagnosis not present

## 2023-04-10 DIAGNOSIS — R61 Generalized hyperhidrosis: Secondary | ICD-10-CM | POA: Diagnosis not present

## 2023-04-16 DIAGNOSIS — R1319 Other dysphagia: Secondary | ICD-10-CM | POA: Diagnosis not present

## 2023-04-16 DIAGNOSIS — C7A010 Malignant carcinoid tumor of the duodenum: Secondary | ICD-10-CM | POA: Diagnosis not present

## 2023-04-16 DIAGNOSIS — K3189 Other diseases of stomach and duodenum: Secondary | ICD-10-CM | POA: Diagnosis not present

## 2023-04-16 DIAGNOSIS — K449 Diaphragmatic hernia without obstruction or gangrene: Secondary | ICD-10-CM | POA: Diagnosis not present

## 2023-04-16 DIAGNOSIS — K2100 Gastro-esophageal reflux disease with esophagitis, without bleeding: Secondary | ICD-10-CM | POA: Diagnosis not present

## 2023-04-25 ENCOUNTER — Emergency Department (HOSPITAL_COMMUNITY): Payer: Medicare HMO

## 2023-04-25 ENCOUNTER — Encounter (HOSPITAL_COMMUNITY): Payer: Self-pay

## 2023-04-25 ENCOUNTER — Emergency Department (HOSPITAL_COMMUNITY)
Admission: EM | Admit: 2023-04-25 | Discharge: 2023-04-25 | Disposition: A | Discharge status: Home / Self Care | Payer: Medicare HMO | Attending: Emergency Medicine | Admitting: Emergency Medicine

## 2023-04-25 ENCOUNTER — Other Ambulatory Visit: Payer: Self-pay

## 2023-04-25 DIAGNOSIS — R1084 Generalized abdominal pain: Secondary | ICD-10-CM | POA: Insufficient documentation

## 2023-04-25 DIAGNOSIS — J9811 Atelectasis: Secondary | ICD-10-CM | POA: Diagnosis not present

## 2023-04-25 DIAGNOSIS — Z79899 Other long term (current) drug therapy: Secondary | ICD-10-CM | POA: Insufficient documentation

## 2023-04-25 DIAGNOSIS — I2510 Atherosclerotic heart disease of native coronary artery without angina pectoris: Secondary | ICD-10-CM | POA: Insufficient documentation

## 2023-04-25 DIAGNOSIS — G238 Other specified degenerative diseases of basal ganglia: Secondary | ICD-10-CM | POA: Diagnosis not present

## 2023-04-25 DIAGNOSIS — R0789 Other chest pain: Secondary | ICD-10-CM | POA: Insufficient documentation

## 2023-04-25 DIAGNOSIS — I6523 Occlusion and stenosis of bilateral carotid arteries: Secondary | ICD-10-CM | POA: Diagnosis not present

## 2023-04-25 DIAGNOSIS — I10 Essential (primary) hypertension: Secondary | ICD-10-CM | POA: Diagnosis not present

## 2023-04-25 DIAGNOSIS — I6623 Occlusion and stenosis of bilateral posterior cerebral arteries: Secondary | ICD-10-CM | POA: Diagnosis not present

## 2023-04-25 DIAGNOSIS — R918 Other nonspecific abnormal finding of lung field: Secondary | ICD-10-CM | POA: Diagnosis not present

## 2023-04-25 DIAGNOSIS — Z7982 Long term (current) use of aspirin: Secondary | ICD-10-CM | POA: Diagnosis not present

## 2023-04-25 DIAGNOSIS — I672 Cerebral atherosclerosis: Secondary | ICD-10-CM | POA: Diagnosis not present

## 2023-04-25 DIAGNOSIS — R079 Chest pain, unspecified: Secondary | ICD-10-CM

## 2023-04-25 DIAGNOSIS — Z1152 Encounter for screening for COVID-19: Secondary | ICD-10-CM | POA: Diagnosis not present

## 2023-04-25 DIAGNOSIS — R531 Weakness: Secondary | ICD-10-CM | POA: Diagnosis not present

## 2023-04-25 LAB — TROPONIN I (HIGH SENSITIVITY)
Troponin I (High Sensitivity): 3 ng/L (ref <18)
Troponin I (High Sensitivity): 4 ng/L (ref <18)

## 2023-04-25 LAB — COMPREHENSIVE METABOLIC PANEL
ALT: 15 U/L (ref 0–44)
AST: 18 U/L (ref 15–41)
Albumin: 3.8 g/dL (ref 3.5–5.0)
Alkaline Phosphatase: 108 U/L (ref 38–126)
Anion gap: 10 (ref 5–15)
BUN: 5 mg/dL — ABNORMAL LOW (ref 8–23)
CO2: 24 mmol/L (ref 22–32)
Calcium: 8.9 mg/dL (ref 8.9–10.3)
Chloride: 104 mmol/L (ref 98–111)
Creatinine, Ser: 0.72 mg/dL (ref 0.44–1.00)
GFR, Estimated: >60 mL/min (ref >60)
Glucose, Bld: 118 mg/dL — ABNORMAL HIGH (ref 70–99)
Potassium: 4 mmol/L (ref 3.5–5.1)
Sodium: 138 mmol/L (ref 135–145)
Total Bilirubin: <0.1 mg/dL — ABNORMAL LOW (ref 0.3–1.2)
Total Protein: 7.1 g/dL (ref 6.5–8.1)

## 2023-04-25 LAB — CBC WITH DIFFERENTIAL/PLATELET
Abs Immature Granulocytes: 0.02 10*3/uL (ref 0.00–0.07)
Basophils Absolute: 0 10*3/uL (ref 0.0–0.1)
Basophils Relative: 1 %
Eosinophils Absolute: 0.2 10*3/uL (ref 0.0–0.5)
Eosinophils Relative: 3 %
HCT: 40.6 % (ref 36.0–46.0)
Hemoglobin: 13.3 g/dL (ref 12.0–15.0)
Immature Granulocytes: 0 %
Lymphocytes Relative: 35 %
Lymphs Abs: 2 10*3/uL (ref 0.7–4.0)
MCH: 30.6 pg (ref 26.0–34.0)
MCHC: 32.8 g/dL (ref 30.0–36.0)
MCV: 93.5 fL (ref 80.0–100.0)
Monocytes Absolute: 0.6 10*3/uL (ref 0.1–1.0)
Monocytes Relative: 11 %
Neutro Abs: 2.8 10*3/uL (ref 1.7–7.7)
Neutrophils Relative %: 50 %
Platelets: 339 10*3/uL (ref 150–400)
RBC: 4.34 MIL/uL (ref 3.87–5.11)
RDW: 12.1 % (ref 11.5–15.5)
WBC: 5.6 10*3/uL (ref 4.0–10.5)
nRBC: 0 % (ref 0.0–0.2)

## 2023-04-25 LAB — URINALYSIS, ROUTINE W REFLEX MICROSCOPIC
Bilirubin Urine: NEGATIVE
Glucose, UA: NEGATIVE mg/dL
Hgb urine dipstick: NEGATIVE
Ketones, ur: NEGATIVE mg/dL
Nitrite: NEGATIVE
Protein, ur: NEGATIVE mg/dL
Specific Gravity, Urine: 1.003 — ABNORMAL LOW (ref 1.005–1.030)
pH: 7 (ref 5.0–8.0)

## 2023-04-25 LAB — SARS CORONAVIRUS 2 BY RT PCR: SARS Coronavirus 2 by RT PCR: NEGATIVE

## 2023-04-25 LAB — LIPASE, BLOOD: Lipase: 23 U/L (ref 11–51)

## 2023-04-25 MED ORDER — METHYLPREDNISOLONE SODIUM SUCC 125 MG IJ SOLR
125.0000 mg | Freq: Once | INTRAMUSCULAR | Status: AC
  Administered 2023-04-25: 125 mg via INTRAVENOUS
  Filled 2023-04-25: qty 2

## 2023-04-25 MED ORDER — IOHEXOL 350 MG/ML SOLN
75.0000 mL | Freq: Once | INTRAVENOUS | Status: AC | PRN
  Administered 2023-04-25: 75 mL via INTRAVENOUS

## 2023-04-25 NOTE — Discharge Instructions (Addendum)
It was a pleasure taking care of you today.  As discussed, your workup was reassuring.  Please call your cardiologist to schedule an appointment for further evaluation.  Return to the ER for any worsening symptoms.

## 2023-04-25 NOTE — ED Triage Notes (Signed)
Pt arrived POV from home c/o of not feeling well for several days. Pt states she just does not feel right She has a funny feeling on the left side of her chest that radiates to her left shoulder, up into her neck, into her back Pt also endorses SHOB, nausea and dizziness with the CP.

## 2023-04-25 NOTE — ED Notes (Signed)
O2 was 100% while ambulating in hallway.

## 2023-04-25 NOTE — ED Provider Notes (Signed)
[place] [facility]ERGENCY DEPARTMENT AT [facility] Provider Note   CSN: [id number] Arrival date & time: 04/25/23  8295     History  Chief Complaint  Patient presents with   Chest Pain    Megan [name] is a 72 y.o. female with a past medical history significant for anxiety, GERD, hyperlipidemia, hypertension, tricuspid regurgitation, CAD, IBS who presents to the ED due to generalized weakness for the past 3 days.  Patient states she has had intermittent left-sided headache that radiates to her neck and to left shoulder.  Currently pain-free.  Also admits to bilateral blurry vision, left worse than right.  Patient states she woke up this morning with left-sided chest pressure associated with palpitations.  She also admits to left upper extremity numbness/tingling.  Notes she felt slightly weaker in the left upper extremity.  Numbness/tingling and weakness has completely resolved.  Admits to 1 episode of nonbloody, nonbilious emesis yesterday.  No diarrhea.  No fever or chills. Previous CVA. No speech changes.   History obtained from patient and past medical records. No interpreter used during encounter.       Home Medications Prior to Admission medications   Medication Sig Start Date End Date Taking? Authorizing Provider  albuterol (VENTOLIN HFA) 108 (90 Base) MCG/ACT inhaler TAKE 2 PUFFS BY MOUTH EVERY 6 HOURS AS NEEDED FOR WHEEZE OR SHORTNESS OF BREATH 01/08/23   Loyola Mast, MD  ALPRAZolam Prudy Feeler) 1 MG tablet TAKE 1 TABLET BY MOUTH 2 TIMES DAILY AS NEEDED. 03/03/23   Loyola Mast, MD  Alum & Mag Hydroxide-Simeth (MYLANTA PO) Take by mouth. PRN    [provider]  Alum Hydroxide-Mag Carbonate (GAVISCON PO) Take by mouth. PRN    [provider]  aspirin EC 81 MG tablet Take 81 mg by mouth daily. Swallow whole.    [provider]  Biotin 10 MG TABS Take by mouth.    [provider]  cholecalciferol (VITAMIN D3) 25 MCG (1000 UNIT) tablet  Take 5,000 Units by mouth daily.    [provider]  cyclobenzaprine (FLEXERIL) 5 MG tablet Take 1 tablet (5 mg total) by mouth 3 (three) times daily as needed for muscle spasms. 01/01/23   Selmer Dominion, NP  diltiazem (CARDIZEM CD) 180 MG 24 hr capsule Take 1 capsule (180 mg total) by mouth daily. 02/04/23   Tonny Bollman, MD  estradiol (ESTRACE) 0.1 MG/GM vaginal cream PLACE 0.5 G VAGINALLY 2 (TWO) TIMES A WEEK. PLACE 0.5G NIGHTLY FOR TWO WEEKS THEN TWICE A WEEK AFTER 03/16/23   Selmer Dominion, NP  Evolocumab (REPATHA SURECLICK) 140 MG/ML SOAJ Inject 140 mg into the skin every 14 (fourteen) days. 06/17/22   Pavero, Cristal Deer, RPH  FLUoxetine (PROZAC) 10 MG tablet Take 1 tablet (10 mg total) by mouth daily. 04/01/23   Loyola Mast, MD  fluticasone [facility]) 50 MCG/ACT nasal spray SPRAY 2 SPRAYS INTO EACH NOSTRIL EVERY DAY 06/25/22   Loyola Mast, MD  gabapentin (NEURONTIN) 300 MG capsule Take 1 capsule twice daily 01/01/23   Glean Salvo, NP  LINZESS 290 MCG CAPS capsule Take 1 capsule (290 mcg total) by mouth daily. 02/24/23   Loyola Mast, MD  metoprolol succinate (TOPROL XL) 25 MG 24 hr tablet Take 1 tablet (25 mg total) by mouth daily. 01/19/23   Tonny Bollman, MD  Multiple Vitamin (MULTIVITAMIN) tablet Take 1 tablet by mouth daily.    [provider]  ondansetron (ZOFRAN) 4 MG tablet TAKE  1 TABLET BY MOUTH EVERY 8 HOURS AS NEEDED FOR NAUSEA AND VOMITING 02/24/23   Loyola Mast, MD  pantoprazole (PROTONIX) 40 MG tablet Take 1 tablet (40 mg total) by mouth daily. 02/24/23   Loyola Mast, MD  promethazine (PHENERGAN) 25 MG tablet TAKE 1 TABLET BY MOUTH EVERY 6 HOURS AS NEEDED FOR NAUSEA OR VOMITING. 12/11/22   Loyola Mast, MD  RABEprazole (ACIPHEX) 20 MG tablet Take 20 mg by mouth daily.    [provider]  spironolactone (ALDACTONE) 25 MG tablet Take 0.5 tablets (12.5 mg total) by mouth daily. 02/24/23   Tereso Newcomer T, PA-C  sucralfate (CARAFATE) 1 g  tablet Take 1 g by mouth 4 (four) times daily. 02/23/23   [provider]  traZODone (DESYREL) 50 MG tablet TAKE 1/2 TO 1 TABLET BY MOUTH AT BEDTIME AS NEEDED FOR SLEEP 01/04/23   Loyola Mast, MD  Vitamin E (VITAMIN E/D-ALPHA NATURAL) 268 MG (400 UNIT) CAPS Take by mouth.    [provider]      Allergies    Sulfamethoxazole, Amitriptyline, Atorvastatin, Cymbalta [duloxetine hcl], Ezetimibe, Lactose intolerance (gi), Lexapro [escitalopram oxalate], Rosuvastatin, Topamax [topiramate], Valsartan, and Prednisone    Review of Systems   Review of Systems  Constitutional:  Negative for fever.  Eyes:  Positive for visual disturbance.  Respiratory:  Positive for shortness of breath.   Cardiovascular:  Positive for chest pain. Negative for leg swelling.  Gastrointestinal:  Positive for nausea and vomiting.  Neurological:  Positive for dizziness, weakness, numbness and headaches. Negative for speech difficulty.    Physical Exam Updated Vital Signs BP 129/61   Pulse 80   Temp 98.4 F (36.9 C)   Resp 18   Ht 5\' 6"  (1.676 m)   Wt 66.7 kg   SpO2 100%   BMI 23.73 kg/m  Physical Exam Vitals and nursing note reviewed.  Constitutional:      General: She is not in acute distress.    Appearance: She is not ill-appearing.  HENT:     Head: Normocephalic.  Eyes:     Pupils: Pupils are equal, round, and reactive to light.  Cardiovascular:     Rate and Rhythm: Normal rate and regular rhythm.     Pulses: Normal pulses.     Heart sounds: Normal heart sounds. No murmur heard.    No friction rub. No gallop.  Pulmonary:     Effort: Pulmonary effort is normal.     Breath sounds: Normal breath sounds.  Abdominal:     General: Abdomen is flat. There is no distension.     Palpations: Abdomen is soft.     Tenderness: There is no abdominal tenderness. There is no guarding or rebound.  Musculoskeletal:        General: Normal range of motion.     Cervical back: Neck supple.      Comments: No lower extremity edema  Skin:    General: Skin is warm and dry.  Neurological:     General: No focal deficit present.     Mental Status: She is alert.     Comments: Speech is clear, able to follow commands CN III-XII intact Normal strength in upper and lower extremities bilaterally including dorsiflexion and plantar flexion, strong and equal grip strength Sensation grossly intact throughout Moves extremities without ataxia, coordination intact No pronator drift   Psychiatric:        Mood and Affect: Mood normal.  Behavior: Behavior normal.     ED Results / Procedures / Treatments   Labs (all labs ordered are listed, but only abnormal results are displayed) Labs Reviewed  COMPREHENSIVE METABOLIC PANEL - Abnormal; Notable for the following components:      Result Value   Glucose, Bld 118 (*)    BUN 5 (*)    Total Bilirubin <0.1 (*)    All other components within normal limits  URINALYSIS, ROUTINE W REFLEX MICROSCOPIC - Abnormal; Notable for the following components:   Color, Urine STRAW (*)    Specific Gravity, Urine 1.003 (*)    Leukocytes,Ua SMALL (*)    Bacteria, UA RARE (*)    All other components within normal limits  SARS CORONAVIRUS 2 BY RT PCR  CBC WITH DIFFERENTIAL/PLATELET  LIPASE, BLOOD  TROPONIN I (HIGH SENSITIVITY)  TROPONIN I (HIGH SENSITIVITY)    EKG EKG Interpretation Date/Time:  Saturday April 25 2023 09:23:29 EDT Ventricular Rate:  78 PR Interval:  149 QRS Duration:  120 QT Interval:  405 QTC Calculation: 462 R Axis:   72  Text Interpretation: Sinus rhythm IVCD, consider atypical RBBB Confirmed by Kristine Royal [phone]) on 04/25/2023 9:27:26 AM  Radiology CT ANGIO HEAD NECK W WO CM  Result Date: 04/25/2023 CLINICAL DATA:  72 year old female with malaise. Abnormal sensation left chest radiating to shoulder, neck. EXAM: CT ANGIOGRAPHY HEAD AND NECK WITH AND WITHOUT CONTRAST TECHNIQUE: Multidetector CT imaging of the head and  neck was performed using the standard protocol during bolus administration of intravenous contrast. Multiplanar CT image reconstructions and MIPs were obtained to evaluate the vascular anatomy. Carotid stenosis measurements (when applicable) are obtained utilizing NASCET criteria, using the distal internal carotid diameter as the denominator. RADIATION DOSE REDUCTION: This exam was performed according to the departmental dose-optimization program which includes automated exposure control, adjustment of the mA and/or kV according to patient size and/or use of iterative reconstruction technique. CONTRAST:  75mL OMNIPAQUE IOHEXOL 350 MG/ML SOLN COMPARISON:  Neck CT 01/29/2023. Thyroid ultrasound 02/27/2023. Brain MRI 08/15/2021. Head CT 07/24/2020. FINDINGS: CT HEAD Brain: No midline shift, ventriculomegaly, mass effect, evidence of mass lesion, intracranial hemorrhage or evidence of cortically based acute infarction. Chronic lacunar infarct left basal ganglia is stable since 2021. Stable gray-white matter differentiation throughout the brain. Stable basal ganglia vascular calcifications. Chronic subinsular white matter hypodensity. Calvarium and skull base: No acute osseous abnormality identified. Paranasal sinuses: Visualized paranasal sinuses and mastoids are well aerated. Orbits: No acute orbit or scalp soft tissue finding. CTA NECK Skeleton: Cervical disc and endplate degeneration maximal at C4-C5 and C5-C6. No acute osseous abnormality identified. Upper chest: Negative. Other neck: No acute finding. Thyroid has been evaluated on previous imaging. (ref: J Am Coll Radiol. 2015 Feb;12(2): 143-50). Aortic arch: Calcified aortic atherosclerosis.  3 vessel arch. Right carotid system: Soft and calcified brachiocephalic artery plaque without stenosis. Negative right CCA origin. Minimal plaque at the right carotid bifurcation. No stenosis to the skull base. Left carotid system: Mild left CCA origin plaque without  stenosis. Moderate calcified plaque left ICA origin and bulb. Less than 50 % stenosis with respect to the distal vessel. Vertebral arteries: Right subclavian artery origin and right vertebral artery origin are normal. Right vertebral is patent, within normal limits to the skull base. Soft and calcified proximal left subclavian artery plaque without significant stenosis. Left vertebral artery origin mild calcified plaque without stenosis (series 12, image 97). Fairly codominant left vertebral artery is otherwise patent and normal to the skull base.  CTA HEAD Posterior circulation: Codominant distal vertebral arteries and vertebrobasilar junction are patent without plaque or stenosis. The left V4 segment is diminutive beyond PICA. Right AICA appears to be dominant. Patent basilar artery without stenosis. Fetal type PCA origins. Moderate to severe bilateral PCA P2 segment stenosis, and additional similar P3 segment irregularity and stenosis bilaterally. See series 14, image 19). Distal PCA enhancement is maintained. Anterior circulation: Both ICA siphons are patent. Mild left siphon calcified plaque without stenosis. Normal left ophthalmic and posterior communicating artery origins. Mild right siphon calcified plaque without stenosis. Normal right ophthalmic and posterior communicating artery origins. Patent carotid termini, normal MCA and ACA origins. Normal anterior communicating artery. Median artery of the corpus callosum, normal variant. Bilateral ACA branches are within normal limits but there is moderate to severe stenosis of the median artery as seen on series 16, image 14. Preserved distal enhancement. Left MCA M1 segment and bifurcation are patent without stenosis. Right MCA M1 segment and bifurcation are patent without stenosis. Bilateral MCA branches are patent with mild M3 branch irregularity such as on series 16, image 6. Venous sinuses: Early contrast timing, grossly patent. Anatomic variants: Fetal type  PCA origins. Median artery of the corpus callosum. Review of the MIP images confirms the above findings IMPRESSION: 1. Stable non contrast CT appearance of chronic small vessel disease since 2021. 2. CTA with no large vessel occlusion, and generally mild for age extracranial atherosclerosis. But Positive for Moderate To Severe Intracranial Atherosclerosis and stenoses affecting the bilateral PCAs (P2 and P3 segments) and also a Median Artery Of The Corpus Callosum. 3.  Aortic Atherosclerosis (ICD10-I70.0). Electronically Signed   By: Odessa Fleming M.D.   On: 04/25/2023 12:30   DG Chest Portable 1 View  Result Date: 04/25/2023 CLINICAL DATA:  Chest pain. EXAM: PORTABLE CHEST 1 VIEW COMPARISON:  Dec 18, 2022. FINDINGS: The heart size and mediastinal contours are within normal limits. Hypoinflation of the lungs is noted with minimal bibasilar subsegmental atelectasis. Bilateral peribronchial thickening is noted suggesting some degree of acute or chronic bronchitis. The visualized skeletal structures are unremarkable. IMPRESSION: Bilateral peribronchial thickening is noted suggesting some degree of acute or chronic bronchitis. Hypoinflation of the lungs with minimal bibasilar subsegmental atelectasis. Electronically Signed   By: Lupita Raider M.D.   On: 04/25/2023 10:17    Procedures Procedures    Medications Ordered in ED Medications  methylPREDNISolone sodium succinate (SOLU-MEDROL) 125 mg/2 mL injection 125 mg (has no administration in time range)  iohexol (OMNIPAQUE) 350 MG/ML injection 75 mL (75 mLs Intravenous Contrast Given 04/25/23 1136)    ED Course/ Medical Decision Making/ A&P                                 Medical Decision Making Amount and/or Complexity of Data Reviewed Independent Historian: spouse External Data Reviewed: notes.    Details: cardiology Labs: ordered. Decision-making details documented in ED Course. Radiology: ordered and independent interpretation performed.  Decision-making details documented in ED Course. ECG/medicine tests: ordered and independent interpretation performed. Decision-making details documented in ED Course.  Risk Prescription drug management.   This patient presents to the ED for concern of weakness, CP, dizziness, this involves an extensive number of treatment options, and is a complaint that carries with it a high risk of complications and morbidity.  The differential diagnosis includes ACS, PE, aortic dissection, infection, dehydration, etc  72 year old female presents to the ED due  to generalized weakness, chest pain, dizziness x 3 days.  Also admits to significant left-sided neck pain and headache associated with blurry vision.  History of CAD, HTN, and hyperlipidemia.  Upon arrival, patient afebrile, not tachycardic or hypoxic.  Patient in no acute distress.  Normal neurological exam without any neurological deficits.  No rash on left side of neck/face to suggest shingles.  Lungs clear to auscultation bilaterally.  No lower extremity edema.  Routine labs ordered.  CTA head/neck.  Cardiac to rule out ACS.  Low suspicion for PE/DVT.  Presentation nonconcerning for aortic dissection.  CBC unremarkable.  No leukocytosis.  Normal hemoglobin.  COVID-negative.  UA significant for small leukocytes and rare bacteria.  Low suspicion for acute cystitis.  CMP reassuring.  Normal renal function.  No major electrolyte derangements.  Lipase normal.  Low suspicion for pancreatitis.  Troponin x 2 normal.  EKG demonstrates normal sinus rhythm.  No signs of acute ischemia.  Low suspicion for ACS.  Chest x-ray personally reviewed and interpreted which demonstrates bilateral peribronchial thickening which is suggestive of acute or chronic bronchitis.  Patient has no wheeze on exam. Will give dose of steroids here. Has had palpitations in the past from prednisone, so prefers to hold off on prednisone right now.  CTA head and neck demonstrates stable  appearance of chronic small vessel disease.  No LVO.  Does demonstrate moderate to severe atherosclerosis and stenosis. Patient able to ambulate without difficulty. No neurological deficits on exam. Low suspicion for CVA, so will hold on MRI brain at this time.  Upon reassessment, patient admits to some improvement in symptoms.  Patient stable for discharge.  Advised patient to follow-up with her cardiologist for further evaluation of chest pain and palpitations. Strict ED precautions discussed with patient. Patient states understanding and agrees to plan. Patient discharged home in no acute distress and stable vitals.   Lives at home Hx HTN Has PCP  Discussed with Dr. Rodena Medin who agrees with assessment and plan.       Final Clinical Impression(s) / ED Diagnoses Final diagnoses:  Nonspecific chest pain  Generalized weakness    Rx / DC Orders ED Discharge Orders     None         Jesusita Oka 04/25/23 1429    Wynetta Fines, MD 04/25/23 1616

## 2023-04-26 ENCOUNTER — Encounter: Payer: Self-pay | Admitting: Family Medicine

## 2023-04-27 ENCOUNTER — Ambulatory Visit: Payer: Medicare HMO | Admitting: Obstetrics and Gynecology

## 2023-04-27 ENCOUNTER — Encounter: Payer: Self-pay | Admitting: Obstetrics and Gynecology

## 2023-04-27 VITALS — BP 145/70 | HR 65

## 2023-04-27 DIAGNOSIS — N952 Postmenopausal atrophic vaginitis: Secondary | ICD-10-CM

## 2023-04-27 DIAGNOSIS — R232 Flushing: Secondary | ICD-10-CM | POA: Diagnosis not present

## 2023-04-27 DIAGNOSIS — R350 Frequency of micturition: Secondary | ICD-10-CM

## 2023-04-27 DIAGNOSIS — M62838 Other muscle spasm: Secondary | ICD-10-CM | POA: Diagnosis not present

## 2023-04-27 LAB — POCT URINALYSIS DIPSTICK
Bilirubin, UA: NEGATIVE
Blood, UA: NEGATIVE
Glucose, UA: NEGATIVE
Ketones, UA: NEGATIVE
Leukocytes, UA: NEGATIVE
Nitrite, UA: NEGATIVE
Protein, UA: NEGATIVE
Spec Grav, UA: 1.01 (ref 1.010–1.025)
Urobilinogen, UA: 0.2 U/dL
pH, UA: 6.5 (ref 5.0–8.0)

## 2023-04-27 NOTE — Patient Instructions (Addendum)
Stop the muscle relaxants  Restart use of the estrogen cream.   We will check your urine and call in an antibiotic if you need it.   For your hot flashes you can consider adding in Black Cohosh or [employer] support for hot flashes.   The Most Bothersome Foods* The Least Bothersome Foods*  Coffee - Regular & Decaf Tea - caffeinated Carbonated beverages - cola, non-colas, diet & caffeine-free Alcohols - Beer, Red Wine, White Wine, [address] - Grapefruit, [place], Orange, [employer] - Cranberry, Grapefruit, Orange, Pineapple Vegetables - Tomato & Tomato Products Flavor Enhancers - Hot peppers, Spicy foods, Chili, Horseradish, Vinegar, Monosodium glutamate (MSG) Artificial Sweeteners - NutraSweet, Sweet 'N Low, Equal (sweetener), Saccharin Ethnic foods - [place], [place], [place] food [employer] - low-fat & whole Fruits - Bananas, Blueberries, Honeydew melon, Pears, Raisins, Watermelon Vegetables - Broccoli, [address] Sprouts, [place], Carrots, Cauliflower, [place], Cucumber, Mushrooms, Peas, Radishes, Squash, Zucchini, White potatoes, Sweet potatoes & yams Poultry - Chicken, Eggs, [place], [employer] - Beef, Diplomatic Services operational officer, Lamb Seafood - Shrimp, [place] fish, Salmon Grains - Oat, Rice Snacks - Pretzels, Popcorn  *Lenward Chancellor et al. Diet and its role in interstitial cystitis/bladder pain syndrome (IC/BPS) and comorbid conditions. [employer]. [employer]JU Int. 2012 Jan 11.

## 2023-04-27 NOTE — Progress Notes (Unsigned)
[facility] Return Visit  SUBJECTIVE  History of Present Illness: Megan [name] is a 72 y.o. female seen in follow-up for levator spasm, incomplete bladder emptying, and reported hot flashes. Plan at last visit was re-start estrogen cream, use flexeril as needed for pelvic pain, and let us know of any UTI symptoms.     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 difficile diarrhea, CAD (coronary artery disease) (02/24/2023), Cancer (HCC), Chest pain, atypical (07/14/2011), Cold sore (07/17/2016), Colon polyp, Contact dermatitis (03/23/2012), Cystocele, midline, Depression, Diarrhea, Diverticulosis, Endometrial hyperplasia (02/27/2006), GERD (gastroesophageal reflux disease), Headache (04/16/2015), Headache, Headache(784.0) (04/15/2012), Heart murmur, Hematuria (04/27/2012), Hepatic cyst, Hiatal hernia, Hiatal hernia, Hyperlipidemia, mixed (12/17/2014), Hypertension, IBS (irritable bowel syndrome), Insomnia, Leaky heart valve, Low back pain (08/15/2013), Osteoporosis (03/2017), Palpitations (08/29/2011), Pancreatic cyst (04/19/2013), Paronychia of great toe, left (06/19/2013), Preventative health care (09/11/2014), Rectal bleeding (10/10/2011), Rectocele, 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   Past Surgical History: She  has a past surgical history that includes Appendectomy; Uterine fibroid surgery; Hysteroscopy; Cardiac catheterization (Left, 07/24/2016); Upper gi endoscopy; endoscopic hemoclip; eye surgery  (Left); Open reduction internal fixation (orif) distal radial fracture (Left, 01/17/2019); Robotic assisted laparoscopic sacrocolpopexy (Bilateral, 11/15/2020); Robotic assisted bilateral salpingo oophorectomy (11/15/2020); Robotic assisted supracervical hysterectomy (11/15/2020); and RIGHT HEART CATH (N/A, 01/01/2022).   Medications: She has a current medication list which includes the following prescription(s): albuterol, alprazolam, calcium & magnesium carbonates, alum hydroxide-mag carbonate, aspirin ec, biotin, cholecalciferol, cyclobenzaprine, diltiazem, estradiol, repatha sureclick, fluoxetine, fluticasone, gabapentin, linzess, metoprolol succinate, multivitamin, ondansetron, pantoprazole, promethazine, rabeprazole, spironolactone, sucralfate, trazodone, and vitamin e.   Allergies: Patient is allergic to sulfamethoxazole, amitriptyline, atorvastatin, cymbalta [duloxetine hcl], ezetimibe, lactose intolerance (gi), lexapro [escitalopram oxalate], rosuvastatin, topamax [topiramate], valsartan, and prednisone.   Social History: Patient  reports that she quit smoking about 27 years ago. Her smoking use included cigarettes. She has never used smokeless tobacco. She reports that she does not drink alcohol and does not use drugs.      OBJECTIVE    Patient reported she did not need a chaperone for pelvic exam, she was aware that there were people who could chaperone and deferred for her exam.   Physical Exam: Vitals:   04/27/23 1001 04/27/23 1004  BP: (!) 161/70 (!) 145/70  Pulse: 69 65   Gen: No apparent distress, A&O x 3.  Detailed Urogynecologic Evaluation:  Patient has    ASSESSMENT AND PLAN    Megan [name] is a 72 y.o. with:  1. Urinary frequency   2. Levator spasm   3. Vaginal atrophy

## 2023-04-28 ENCOUNTER — Ambulatory Visit (INDEPENDENT_AMBULATORY_CARE_PROVIDER_SITE_OTHER): Payer: Medicare HMO | Admitting: Family Medicine

## 2023-04-28 ENCOUNTER — Encounter: Payer: Self-pay | Admitting: Family Medicine

## 2023-04-28 VITALS — BP 136/78 | HR 89 | Temp 98.7°F | Ht 66.0 in | Wt 145.8 lb

## 2023-04-28 DIAGNOSIS — J40 Bronchitis, not specified as acute or chronic: Secondary | ICD-10-CM | POA: Diagnosis not present

## 2023-04-28 NOTE — Assessment & Plan Note (Signed)
It is unclear as to whether this was acute vs. chronic bronchitis. She has responded well to a steroid. Megan [name] does have a significant smoking history.  I recommend we have her back in the next 2 weeks for spirometry to assess if she has airflow changes consistent with COPD.
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
## 2023-05-03 DIAGNOSIS — C7A010 Malignant carcinoid tumor of the duodenum: Secondary | ICD-10-CM | POA: Diagnosis not present

## 2023-05-03 DIAGNOSIS — D490 Neoplasm of unspecified behavior of digestive system: Secondary | ICD-10-CM | POA: Diagnosis not present

## 2023-05-05 ENCOUNTER — Other Ambulatory Visit: Payer: Self-pay | Admitting: Family Medicine

## 2023-05-05 ENCOUNTER — Encounter: Payer: Self-pay | Admitting: Family Medicine

## 2023-05-05 DIAGNOSIS — F418 Other specified anxiety disorders: Secondary | ICD-10-CM

## 2023-05-09 ENCOUNTER — Telehealth: Payer: Medicare HMO | Admitting: Nurse Practitioner

## 2023-05-09 DIAGNOSIS — R399 Unspecified symptoms and signs involving the genitourinary system: Secondary | ICD-10-CM | POA: Diagnosis not present

## 2023-05-09 MED ORDER — CEPHALEXIN 500 MG PO CAPS
500.0000 mg | ORAL_CAPSULE | Freq: Two times a day (BID) | ORAL | 0 refills | Status: AC
Start: 2023-05-09 — End: 2023-05-16

## 2023-05-09 NOTE — Progress Notes (Signed)

## 2023-05-09 NOTE — Progress Notes (Signed)
I have spent 5 minutes in review of e-visit questionnaire, review and updating patient chart, medical decision making and response to patient.  ° °Jerrell Mangel W Secilia Apps, NP ° °  °

## 2023-05-12 ENCOUNTER — Ambulatory Visit: Payer: Medicare HMO | Admitting: Family Medicine

## 2023-05-14 ENCOUNTER — Ambulatory Visit: Payer: Medicare HMO

## 2023-05-21 ENCOUNTER — Telehealth: Payer: Self-pay

## 2023-05-21 NOTE — Telephone Encounter (Signed)
Transition Care Management Unsuccessful Follow-up Telephone Call  Date of discharge and from where:  04/25/2023 [facility]  Attempts:  1st Attempt  Reason for unsuccessful TCM follow-up call:  Left voice message  Yanisa Goodgame [facility]  [facility], [facility] Resource Care Guide Direct Dial: [phone]  Website: [email or web address]

## 2023-05-22 ENCOUNTER — Telehealth: Payer: Self-pay

## 2023-05-22 ENCOUNTER — Encounter: Payer: Self-pay | Admitting: Family Medicine

## 2023-05-22 NOTE — Telephone Encounter (Signed)
Transition Care Management Unsuccessful Follow-up Telephone Call  Date of discharge and from where:  04/25/2023 [facility]  Attempts:  2nd Attempt  Reason for unsuccessful TCM follow-up call:  No answer/busy  Joaovictor Krone [facility]  [facility], [facility] Resource Care Guide Direct Dial: [phone]  Website: [email or web address]

## 2023-06-01 ENCOUNTER — Other Ambulatory Visit: Payer: Self-pay | Admitting: Cardiovascular Disease

## 2023-06-02 DIAGNOSIS — C7A010 Malignant carcinoid tumor of the duodenum: Secondary | ICD-10-CM | POA: Diagnosis not present

## 2023-06-03 ENCOUNTER — Other Ambulatory Visit: Payer: Self-pay | Admitting: Obstetrics and Gynecology

## 2023-06-03 DIAGNOSIS — M62838 Other muscle spasm: Secondary | ICD-10-CM

## 2023-06-11 ENCOUNTER — Other Ambulatory Visit: Payer: Self-pay | Admitting: Family Medicine

## 2023-06-11 DIAGNOSIS — K581 Irritable bowel syndrome with constipation: Secondary | ICD-10-CM

## 2023-06-26 MED ORDER — REPATHA SURECLICK 140 MG/ML ~~LOC~~ SOAJ: 140.0000 mg | SUBCUTANEOUS | 11 refills | Status: DC

## 2023-07-01 ENCOUNTER — Telehealth: Payer: Medicare HMO | Admitting: Physician Assistant

## 2023-07-01 DIAGNOSIS — B9689 Other specified bacterial agents as the cause of diseases classified elsewhere: Secondary | ICD-10-CM

## 2023-07-01 DIAGNOSIS — J019 Acute sinusitis, unspecified: Secondary | ICD-10-CM

## 2023-07-01 MED ORDER — DOXYCYCLINE HYCLATE 100 MG PO TABS
100.0000 mg | ORAL_TABLET | Freq: Two times a day (BID) | ORAL | 0 refills | Status: DC
Start: 2023-07-01 — End: 2023-08-12

## 2023-07-01 NOTE — Progress Notes (Signed)
I have spent 5 minutes in review of e-visit questionnaire, review and updating patient chart, medical decision making and response to patient.   Mia Milan Cody Jacklynn Dehaas, PA-C    

## 2023-07-01 NOTE — Progress Notes (Signed)
Message sent to patient requesting further input regarding current symptoms. Awaiting patient response.  

## 2023-07-01 NOTE — Progress Notes (Signed)
E-Visit for Sinus Problems ° °We are sorry that you are not feeling well.  Here is how we plan to help! ° °Based on what you have shared with me it looks like you have sinusitis.  Sinusitis is inflammation and infection in the sinus cavities of the head.  Based on your presentation I believe you most likely have Acute Bacterial Sinusitis.  This is an infection caused by bacteria and is treated with antibiotics. I have prescribed Doxycycline 100mg by mouth twice a day for 10 days. You may use an oral decongestant such as Mucinex D or if you have glaucoma or high blood pressure use plain Mucinex. Saline nasal spray help and can safely be used as often as needed for congestion.  If you develop worsening sinus pain, fever or notice severe headache and vision changes, or if symptoms are not better after completion of antibiotic, please schedule an appointment with a health care provider.   ° °Sinus infections are not as easily transmitted as other respiratory infection, however we still recommend that you avoid close contact with loved ones, especially the very young and elderly.  Remember to wash your hands thoroughly throughout the day as this is the number one way to prevent the spread of infection! ° °Home Care: °Only take medications as instructed by your medical team. °Complete the entire course of an antibiotic. °Do not take these medications with alcohol. °A steam or ultrasonic humidifier can help congestion.  You can place a towel over your head and breathe in the steam from hot water coming from a faucet. °Avoid close contacts especially the very young and the elderly. °Cover your mouth when you cough or sneeze. °Always remember to wash your hands. ° °Get Help Right Away If: °You develop worsening fever or sinus pain. °You develop a severe head ache or visual changes. °Your symptoms persist after you have completed your treatment plan. ° °Make sure you °Understand these instructions. °Will watch your  condition. °Will get help right away if you are not doing well or get worse. ° °Thank you for choosing an e-visit. ° °Your e-visit answers were reviewed by a board certified advanced clinical practitioner to complete your personal care plan. Depending upon the condition, your plan could have included both over the counter or prescription medications. ° °Please review your pharmacy choice. Make sure the pharmacy is open so you can pick up prescription now. If there is a problem, you may contact your provider through MyChart messaging and have the prescription routed to another pharmacy.  Your safety is important to us. If you have drug allergies check your prescription carefully.  ° °For the next 24 hours you can use MyChart to ask questions about today's visit, request a non-urgent call back, or ask for a work or school excuse. °You will get an email in the next two days asking about your experience. I hope that your e-visit has been valuable and will speed your recovery. ° °

## 2023-07-03 ENCOUNTER — Telehealth: Payer: Self-pay | Admitting: Family Medicine

## 2023-07-03 ENCOUNTER — Encounter: Payer: Self-pay | Admitting: Family Medicine

## 2023-07-03 ENCOUNTER — Other Ambulatory Visit: Payer: Self-pay | Admitting: Family Medicine

## 2023-07-03 DIAGNOSIS — F418 Other specified anxiety disorders: Secondary | ICD-10-CM

## 2023-07-03 NOTE — Telephone Encounter (Signed)
Spoke to patient, she states that the fatigue has been going on for a bit and will be okay till her appointment.  Advised her that if she got to feeling worse then please call us to get in with another provider sooner.  No further questions. Dm/cma

## 2023-07-03 NOTE — Telephone Encounter (Signed)
Pt said she is feeling extreme fatigue and would like labs. I was hoping the order could be put in to have them before her appt on the 26th. If order is put in I will call to schedule.

## 2023-07-14 ENCOUNTER — Ambulatory Visit: Payer: Medicare HMO | Admitting: Family Medicine

## 2023-07-14 ENCOUNTER — Ambulatory Visit: Payer: Medicare HMO | Admitting: Obstetrics and Gynecology

## 2023-07-14 ENCOUNTER — Other Ambulatory Visit: Payer: Self-pay | Admitting: Cardiovascular Disease

## 2023-07-14 ENCOUNTER — Encounter: Payer: Self-pay | Admitting: Obstetrics and Gynecology

## 2023-07-14 VITALS — BP 155/72 | HR 85

## 2023-07-14 DIAGNOSIS — R102 Pelvic and perineal pain: Secondary | ICD-10-CM | POA: Diagnosis not present

## 2023-07-14 DIAGNOSIS — M62838 Other muscle spasm: Secondary | ICD-10-CM

## 2023-07-14 DIAGNOSIS — R350 Frequency of micturition: Secondary | ICD-10-CM

## 2023-07-14 LAB — POCT URINALYSIS DIPSTICK
Bilirubin, UA: NEGATIVE
Blood, UA: NEGATIVE
Glucose, UA: NEGATIVE
Ketones, UA: NEGATIVE
Leukocytes, UA: NEGATIVE
Nitrite, UA: NEGATIVE
Protein, UA: NEGATIVE
Spec Grav, UA: 1.01 (ref 1.010–1.025)
Urobilinogen, UA: 0.2 U/dL
pH, UA: 6.5 (ref 5.0–8.0)

## 2023-07-14 MED ORDER — DIAZEPAM 5 MG PO TABS
ORAL_TABLET | ORAL | 0 refills | Status: DC
Start: 2023-07-14 — End: 2023-08-17

## 2023-07-14 NOTE — Patient Instructions (Addendum)
On your lower abdomen please use a heating pad and topical medications to assist in the muscles (Voltaren gel or Icyhot)  For the muscle spasms in the pelvic floor, please use the Vaginal valium at night to assist in decreasing the muscle tension that has occurred. There are only 5 of these because it is not a long term medication to use, just short term due to the acute nature of your pain.

## 2023-07-14 NOTE — Progress Notes (Signed)
[facility] Return Visit  SUBJECTIVE  History of Present Illness: Megan [name] is a 72 y.o. female seen for pelvic pain and pressure following lifting a 250lb box on her own. She is concerned about her prolapse reoccurring and is worried as she has significant lower abdominal and pelvic pain.    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 difficile diarrhea, CAD (coronary artery disease) (02/24/2023), Cancer (HCC), Chest pain, atypical (07/14/2011), Cold sore (07/17/2016), Colon polyp, Contact dermatitis (03/23/2012), Cystocele, midline, Depression, Diarrhea, Diverticulosis, Endometrial hyperplasia (02/27/2006), GERD (gastroesophageal reflux disease), Headache (04/16/2015), Headache, Headache(784.0) (04/15/2012), Heart murmur, Hematuria (04/27/2012), Hepatic cyst, Hiatal hernia, Hiatal hernia, Hyperlipidemia, mixed (12/17/2014), Hypertension, IBS (irritable bowel syndrome), Insomnia, Leaky heart valve, Low back pain (08/15/2013), Osteoporosis (03/2017), Palpitations (08/29/2011), Pancreatic cyst (04/19/2013), Paronychia of great toe, left (06/19/2013), Preventative health care (09/11/2014), Rectal bleeding (10/10/2011), Rectocele, 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   Past Surgical History: She  has a past surgical history that includes Appendectomy; Uterine fibroid surgery; Hysteroscopy; Cardiac catheterization (Left, 07/24/2016); Upper gi endoscopy; endoscopic hemoclip; eye surgery (Left); Open reduction  internal fixation (orif) distal radial fracture (Left, 01/17/2019); Robotic assisted laparoscopic sacrocolpopexy (Bilateral, 11/15/2020); Robotic assisted bilateral salpingo oophorectomy (11/15/2020); Robotic assisted supracervical hysterectomy (11/15/2020); and RIGHT HEART CATH (N/A, 01/01/2022).   Medications: She has a current medication list which includes the following prescription(s): albuterol, alprazolam, calcium & magnesium carbonates, alum hydroxide-mag carbonate, aspirin ec, biotin, cholecalciferol, cyclobenzaprine, diazepam, diltiazem, doxycycline, estradiol, repatha sureclick, fluoxetine, fluticasone, gabapentin, metoprolol succinate, multivitamin, ondansetron, pantoprazole, promethazine, rabeprazole, sucralfate, trazodone, vitamin e, linzess, and spironolactone.   Allergies: Patient is allergic to sulfamethoxazole, amitriptyline, atorvastatin, cymbalta [duloxetine hcl], ezetimibe, lactose intolerance (gi), lexapro [escitalopram oxalate], rosuvastatin, topamax [topiramate], valsartan, and prednisone.   Social History: Patient  reports that she quit smoking about 27 years ago. Her smoking use included cigarettes. She has never used smokeless tobacco. She reports that she does not drink alcohol and does not use drugs.      OBJECTIVE    Lab Results  Component Value Date   COLORU yellow 07/14/2023   CLARITYU clear 07/14/2023   GLUCOSEUR Negative 07/14/2023   BILIRUBINUR negative 07/14/2023   KETONESU negative 07/14/2023   SPECGRAV 1.010 07/14/2023   RBCUR negative 07/14/2023   PHUR 6.5 07/14/2023   PROTEINUR Negative 07/14/2023   UROBILINOGEN 0.2 07/14/2023   LEUKOCYTESUR Negative 07/14/2023    Physical Exam: Vitals:   07/14/23 0918 07/14/23 0956  BP: (!) 151/75 (!) 155/72  Pulse: 76 85   Gen: No apparent distress, A&O x 3.  Detailed Urogynecologic Evaluation: No sign of prolapse reoccurrence.  No sign of bleeding, mass, or other area of concern. Vaginal atrophy present on  exam.        ASSESSMENT AND PLAN    Ms. Hillery is a 72 y.o. with:  1. Pelvic pain   2. Urinary frequency   3. Levator spasm     Pelvic pain:  Patient has acute on chronic pelvic floor pain after lifting a very heavy object without assistance. She reported symptoms have been having pain that has not improved much with topical heat and cold therapy.   Urinary frequency -     POCT  urinalysis dipstick: negative for infection  Levator spasm -Will treat this as an acute irritation of her underlying pelvic pain. We discussed using vaginal valium over the next few nights. We discussed that this is not a long term medication and that is has sedating effects. Patient reports understanding.   Other orders -     diazePAM; Place 1 tablet vaginally nightly as needed for muscle spasm/ pelvic pain.  Dispense: 5 tablet; Refill: 0     Selmer Dominion, NP

## 2023-07-15 ENCOUNTER — Ambulatory Visit: Payer: Medicare HMO | Admitting: Neurology

## 2023-07-22 ENCOUNTER — Encounter: Payer: Self-pay | Admitting: Family Medicine

## 2023-07-22 DIAGNOSIS — N644 Mastodynia: Secondary | ICD-10-CM

## 2023-07-24 NOTE — Addendum Note (Signed)
Addended by: Loyola Mast on: 07/24/2023 05:01 PM   Modules accepted: Orders

## 2023-07-27 ENCOUNTER — Other Ambulatory Visit: Payer: Self-pay | Admitting: Family Medicine

## 2023-07-27 DIAGNOSIS — N644 Mastodynia: Secondary | ICD-10-CM

## 2023-07-29 ENCOUNTER — Ambulatory Visit: Payer: Medicare HMO | Admitting: Obstetrics and Gynecology

## 2023-07-30 ENCOUNTER — Ambulatory Visit: Payer: Medicare HMO | Admitting: Family Medicine

## 2023-07-30 ENCOUNTER — Other Ambulatory Visit: Payer: Medicare HMO

## 2023-08-05 DIAGNOSIS — H26493 Other secondary cataract, bilateral: Secondary | ICD-10-CM | POA: Diagnosis not present

## 2023-08-05 DIAGNOSIS — H40023 Open angle with borderline findings, high risk, bilateral: Secondary | ICD-10-CM | POA: Diagnosis not present

## 2023-08-05 DIAGNOSIS — H0102B Squamous blepharitis left eye, upper and lower eyelids: Secondary | ICD-10-CM | POA: Diagnosis not present

## 2023-08-05 DIAGNOSIS — H35372 Puckering of macula, left eye: Secondary | ICD-10-CM | POA: Diagnosis not present

## 2023-08-05 DIAGNOSIS — H04123 Dry eye syndrome of bilateral lacrimal glands: Secondary | ICD-10-CM | POA: Diagnosis not present

## 2023-08-05 DIAGNOSIS — Z961 Presence of intraocular lens: Secondary | ICD-10-CM | POA: Diagnosis not present

## 2023-08-05 DIAGNOSIS — H0102A Squamous blepharitis right eye, upper and lower eyelids: Secondary | ICD-10-CM | POA: Diagnosis not present

## 2023-08-05 DIAGNOSIS — H40033 Anatomical narrow angle, bilateral: Secondary | ICD-10-CM | POA: Diagnosis not present

## 2023-08-07 ENCOUNTER — Telehealth: Payer: Medicare HMO

## 2023-08-07 ENCOUNTER — Telehealth: Payer: Self-pay | Admitting: Cardiovascular Disease

## 2023-08-07 DIAGNOSIS — H9209 Otalgia, unspecified ear: Secondary | ICD-10-CM

## 2023-08-07 NOTE — Telephone Encounter (Signed)
  Pt sent a message through MyChart patient schedule request.   Comment: "Having irregular heartbeats at night and hot flashes , sweats, headaches"  I advised that Tereso Newcomer and Dr. Excell Seltzer don't have any availability until 09/18/23. I tried to ask for more information about her symptoms using dot phrases for palpitaions, but the patient responded with this:   "I will just try to rest and take my higher dose of Metoprolol. I was on 25 so I took .50 mg today . If I don't get better or worse I will go to an urgent care. Thanks for checking on the appointment thing for me anyways. Hopefully I get better. Sincerely, Megan [name]."

## 2023-08-07 NOTE — Telephone Encounter (Signed)
Left message for patient to call back  

## 2023-08-08 NOTE — Progress Notes (Signed)
Because of your symptoms, I feel your condition warrants further evaluation and I recommend that you be seen in a face to face visit.   NOTE: There will be NO CHARGE for this eVisit   If you are having a true medical emergency please call 911.      For an urgent face to face visit, [facility] has eight urgent care centers for your convenience:   NEW!! [facility] Health Urgent Care Center at [facility] Get Driving Directions [phone] [address], [address] [place], [address]    [facility] Health Urgent Care Center at [facility] Get Driving Directions [phone] [address] [place], [place] [address]   [facility] Health Urgent Care Center [facility]) Get Driving Directions [phone] [address] [place], [place] [address]  [facility] Health Urgent Care Center [facility]) Get Driving Directions [phone] [address] [place],  [place]  [address]  [facility] Health Urgent Care Center [facility] - at [employer]  [phone] [address] [place],  [place] [address]   [facility] Health Urgent Care at [facility] Get Driving Directions [phone] [address], [address] [place], [place] [address]   [facility] Health Urgent Care at [facility] Get Driving Directions  [phone] [address] [place], [place] [address]   [facility] Health Urgent Care at [facility] Directions [phone] [address], [address] [place], [place] [address]  Your MyChart E-visit questionnaire answers were reviewed by a board certified advanced clinical practitioner to complete your personal care plan based on your specific symptoms.  Thank you for using e-Visits.

## 2023-08-09 ENCOUNTER — Other Ambulatory Visit: Payer: Self-pay | Admitting: Family Medicine

## 2023-08-09 DIAGNOSIS — K581 Irritable bowel syndrome with constipation: Secondary | ICD-10-CM

## 2023-08-10 NOTE — Telephone Encounter (Signed)
Left detailed message per DPR asking patient to call us back if still having issues or symptoms. Chart doesn't show that she sought care at [facility] facility for her c/o night sweats, hot flashes, palpitations. Will await her call back to office.

## 2023-08-12 ENCOUNTER — Telehealth: Payer: Medicare HMO | Admitting: Family Medicine

## 2023-08-12 DIAGNOSIS — B9689 Other specified bacterial agents as the cause of diseases classified elsewhere: Secondary | ICD-10-CM

## 2023-08-12 DIAGNOSIS — J019 Acute sinusitis, unspecified: Secondary | ICD-10-CM

## 2023-08-12 MED ORDER — AMOXICILLIN-POT CLAVULANATE 875-125 MG PO TABS
1.0000 | ORAL_TABLET | Freq: Two times a day (BID) | ORAL | 0 refills | Status: DC
Start: 2023-08-12 — End: 2023-08-17

## 2023-08-12 NOTE — Progress Notes (Signed)

## 2023-08-13 ENCOUNTER — Ambulatory Visit: Payer: Medicare HMO

## 2023-08-13 ENCOUNTER — Ambulatory Visit
Admission: RE | Admit: 2023-08-13 | Discharge: 2023-08-13 | Disposition: A | Discharge status: Home / Self Care | Payer: Medicare HMO | Source: Ambulatory Visit | Attending: Family Medicine | Admitting: Family Medicine

## 2023-08-13 ENCOUNTER — Other Ambulatory Visit: Payer: Self-pay | Admitting: Family Medicine

## 2023-08-13 DIAGNOSIS — N644 Mastodynia: Secondary | ICD-10-CM | POA: Diagnosis not present

## 2023-08-13 DIAGNOSIS — I10 Essential (primary) hypertension: Secondary | ICD-10-CM

## 2023-08-14 ENCOUNTER — Other Ambulatory Visit: Payer: Self-pay | Admitting: Family Medicine

## 2023-08-14 DIAGNOSIS — F418 Other specified anxiety disorders: Secondary | ICD-10-CM

## 2023-08-17 ENCOUNTER — Telehealth: Payer: Medicare HMO | Admitting: Physician Assistant

## 2023-08-17 DIAGNOSIS — K047 Periapical abscess without sinus: Secondary | ICD-10-CM

## 2023-08-17 MED ORDER — CLINDAMYCIN HCL 300 MG PO CAPS
300.0000 mg | ORAL_CAPSULE | Freq: Three times a day (TID) | ORAL | 0 refills | Status: DC
Start: 2023-08-17 — End: 2023-08-24

## 2023-08-17 MED ORDER — IBUPROFEN 600 MG PO TABS
600.0000 mg | ORAL_TABLET | Freq: Three times a day (TID) | ORAL | 0 refills | Status: DC | PRN
Start: 2023-08-17 — End: 2023-12-07

## 2023-08-17 NOTE — Progress Notes (Signed)
E-Visit for Dental Pain  We are sorry that you are not feeling well.  Here is how we plan to help!  Based on what you have shared with me in the questionnaire, it sounds like you have a dental infection.  Clindamycin 300mg  3 times a day for 7 days and Ibuprofen 600mg  3 times a day for 7 days for discomfort  It is imperative that you see a dentist within 10 days of this eVisit to determine the cause of the dental pain and be sure it is adequately treated  A toothache or tooth pain is caused when the nerve in the root of a tooth or surrounding a tooth is irritated. Dental (tooth) infection, decay, injury, or loss of a tooth are the most common causes of dental pain. Pain may also occur after an extraction (tooth is pulled out). Pain sometimes originates from other areas and radiates to the jaw, thus appearing to be tooth pain.Bacteria growing inside your mouth can contribute to gum disease and dental decay, both of which can cause pain. A toothache occurs from inflammation of the central portion of the tooth called pulp. The pulp contains nerve endings that are very sensitive to pain. Inflammation to the pulp or pulpitis may be caused by dental cavities, trauma, and infection.    HOME CARE:   For toothaches: Over-the-counter pain medications such as acetaminophen or ibuprofen may be used. Take these as directed on the package while you arrange for a dental appointment. Avoid very cold or hot foods, because they may make the pain worse. You may get relief from biting on a cotton ball soaked in oil of cloves. You can get oil of cloves at most drug stores.  For jaw pain:  Aspirin may be helpful for problems in the joint of the jaw in adults. If pain happens every time you open your mouth widely, the temporomandibular joint (TMJ) may be the source of the pain. Yawning or taking a large bite of food may worsen the pain. An appointment with your doctor or dentist will help you find the cause.      GET HELP RIGHT AWAY IF:  You have a high fever or chills If you have had a recent head or face injury and develop headache, light headedness, nausea, vomiting, or other symptoms that concern you after an injury to your face or mouth, you could have a more serious injury in addition to your dental injury. A facial rash associated with a toothache: This condition may improve with medication. Contact your doctor for them to decide what is appropriate. Any jaw pain occurring with chest pain: Although jaw pain is most commonly caused by dental disease, it is sometimes referred pain from other areas. People with heart disease, especially people who have had stents placed, people with diabetes, or those who have had heart surgery may have jaw pain as a symptom of heart attack or angina. If your jaw or tooth pain is associated with lightheadedness, sweating, or shortness of breath, you should see a doctor as soon as possible. Trouble swallowing or excessive pain or bleeding from gums: If you have a history of a weakened immune system, diabetes, or steroid use, you may be more susceptible to infections. Infections can often be more severe and extensive or caused by unusual organisms. Dental and gum infections in people with these conditions may require more aggressive treatment. An abscess may need draining or IV antibiotics, for example.  MAKE SURE YOU   Understand these  instructions. Will watch your condition. Will get help right away if you are not doing well or get worse.  Thank you for choosing an e-visit.  Your e-visit answers were reviewed by a board certified advanced clinical practitioner to complete your personal care plan. Depending upon the condition, your plan could have included both over the counter or prescription medications.  Please review your pharmacy choice. Make sure the pharmacy is open so you can pick up prescription now. If there is a problem, you may contact your provider  through [employer] and have the prescription routed to another pharmacy.  Your safety is important to [place]. If you have drug allergies check your prescription carefully.   For the next 24 hours you can use MyChart to ask questions about today's visit, request a non-urgent call back, or ask for a work or school excuse. You will get an email in the next two days asking about your experience. I hope that your e-visit has been valuable and will speed your recovery.  I have spent 5 minutes in review of e-visit questionnaire, review and updating patient chart, medical decision making and response to patient.   Margaretann Loveless, PA-C

## 2023-08-20 ENCOUNTER — Ambulatory Visit: Payer: Medicare HMO | Admitting: Family Medicine

## 2023-08-20 VITALS — BP 136/70 | HR 72 | Temp 98.2°F | Ht 66.0 in | Wt 150.4 lb

## 2023-08-20 DIAGNOSIS — I10 Essential (primary) hypertension: Secondary | ICD-10-CM

## 2023-08-20 DIAGNOSIS — E042 Nontoxic multinodular goiter: Secondary | ICD-10-CM | POA: Diagnosis not present

## 2023-08-20 DIAGNOSIS — L84 Corns and callosities: Secondary | ICD-10-CM | POA: Diagnosis not present

## 2023-08-20 NOTE — Assessment & Plan Note (Signed)
 BP has been fluctuating. Today, it is in reasonable control. She has had past work-up for other secondary causes, esp. in light of other symptoms like palpitations and hot flashes. So far, no other etiology has been noted. I feel her stress level and anxiety likely play a role in this. we will continue her om metoprolol  succinate 25 mg daily and spironolactone  25 mg daily for now. She will follow up with cardiology.

## 2023-08-20 NOTE — Assessment & Plan Note (Signed)
 Reassured Megan [name]. I recommend she start by using corn pads to try and resolve this. If not improving, she could consider seeing podiatry.

## 2023-08-20 NOTE — Assessment & Plan Note (Signed)
 In light of the recommendation for biopsy of one of these lesions, I will place a referral to radiology to have this done.
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
## 2023-08-24 ENCOUNTER — Encounter: Payer: Self-pay | Admitting: Cardiovascular Disease

## 2023-08-24 ENCOUNTER — Ambulatory Visit: Payer: Medicare HMO | Attending: Cardiovascular Disease | Admitting: Cardiovascular Disease

## 2023-08-24 ENCOUNTER — Telehealth: Payer: Medicare HMO | Admitting: Family Medicine

## 2023-08-24 VITALS — BP 140/80 | HR 73 | Ht 66.0 in | Wt 150.4 lb

## 2023-08-24 DIAGNOSIS — R002 Palpitations: Secondary | ICD-10-CM

## 2023-08-24 DIAGNOSIS — I10 Essential (primary) hypertension: Secondary | ICD-10-CM | POA: Diagnosis not present

## 2023-08-24 DIAGNOSIS — I2510 Atherosclerotic heart disease of native coronary artery without angina pectoris: Secondary | ICD-10-CM | POA: Diagnosis not present

## 2023-08-24 DIAGNOSIS — E782 Mixed hyperlipidemia: Secondary | ICD-10-CM

## 2023-08-24 DIAGNOSIS — R399 Unspecified symptoms and signs involving the genitourinary system: Secondary | ICD-10-CM | POA: Diagnosis not present

## 2023-08-24 DIAGNOSIS — I071 Rheumatic tricuspid insufficiency: Secondary | ICD-10-CM

## 2023-08-24 MED ORDER — CEPHALEXIN 500 MG PO CAPS: 500.0000 mg | ORAL_CAPSULE | Freq: Two times a day (BID) | ORAL | 0 refills | Status: AC

## 2023-08-24 NOTE — Patient Instructions (Signed)
  Follow-Up: At [facility], you and your health needs are our priority.  As part of our continuing mission to provide you with exceptional heart care, we have created designated Provider Care Teams.  These Care Teams include your primary Cardiologist (physician) and Advanced Practice Providers (APPs -  Physician Assistants and Nurse Practitioners) who all work together to provide you with the care you need, when you need it.  We recommend signing up for the patient portal called MyChart.  Sign up information is provided on this After Visit Summary.  MyChart is used to connect with patients for Virtual Visits (Telemedicine).  Patients are able to view lab/test results, encounter notes, upcoming appointments, etc.  Non-urgent messages can be sent to your provider as well.   To learn more about what you can do with MyChart, go to [email or web address].    Your next appointment:   1 year(s)  Provider:   Ozell Fell

## 2023-08-24 NOTE — Assessment & Plan Note (Signed)
 BP somewhat labile but no wide swings. Continue metoprolol and diltiazem at current doses. Stress/anxiety play a significant role.

## 2023-08-24 NOTE — Progress Notes (Signed)

## 2023-08-24 NOTE — Assessment & Plan Note (Signed)
 Nonobstructive CAD on CTA study. No angina. Continue ASA and repatha. LDL < 55 mg/dL.

## 2023-08-24 NOTE — Progress Notes (Signed)
 Cardiology Office Note:    Date:  08/24/2023   ID:  Megan [name], DOB 1950-10-04, MRN [id number]  PCP:  Megan Garnette HERO, MD   [facility] Providers Cardiologist:  None     Referring MD: Megan Garnette HERO, MD   Chief Complaint  Patient presents with   Follow-up   Hypertension    History of Present Illness:    Megan [name] is a 73 y.o. female with a hx of:  Coronary artery disease  Myoview  07/09/2016: EF 83, low risk Nonobstructive CAD >> CCTA 12/26/2021: CAC score 115 (72nd percentile); RCA mid and distal <25, LAD proximal and mid 25-49, mid-distal 50-69, LCx proximal 50-69, ascending aorta 33 mm; FFR normal-medical therapy Palpitations  Monitor 01/03/20: no significant arrhythmias  Monitor 12/2021: No A-fib, no PVCs, short SVT runs (11 seconds) Mild to mod TR TTE 12/04/2021: EF 60-65, normal RVSF, moderately elevated PASP, RVSP 52.1, trivial MR, mild to moderate TR, tricuspid regurgitant velocity 3.32 m/s  RHC 01/01/2022: Normal PA pressures, no evidence of pulmonary hypertension, PCWP 5, CO 5.5, CI 3.2 TTE 02/18/2023: EF 60-65, no RWMA, mild LVH, normal RVSF, normal PASP, no MR, mild TR Hypertension  Hyperlipidemia  Intol of statins  GERD  Hypothyroidism   The patient was seen in July 2024 with complaints of hot flashes and heart palpitations.  She wore an event monitor that showed some short supraventricular runs but no sustained arrhythmias, no atrial fibrillation, and no high-grade AV block.  Her average heart rate was 77 bpm. 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 She has developed hot flashes again. She's been evaluated but there has been no cause of the hot flashes discovered. There are some associated heart palpitations as well. No CP or dyspnea. She brings in home BP readings and they are fluctuating between normal range and some elevated readings as high as 160's/80's.    Current  Medications: Current Meds  Medication Sig   albuterol  (VENTOLIN  HFA) 108 (90 Base) MCG/ACT inhaler TAKE 2 PUFFS BY MOUTH EVERY 6 HOURS AS NEEDED FOR WHEEZE OR SHORTNESS OF BREATH   ALPRAZolam  (XANAX ) 1 MG tablet TAKE 1 TABLET BY MOUTH TWICE A DAY AS NEEDED   Alum & Mag Hydroxide-Simeth (MYLANTA PO) Take by mouth. PRN   Alum Hydroxide-Mag Carbonate (GAVISCON PO) Take by mouth. PRN   aspirin  EC 81 MG tablet Take 81 mg by mouth daily. Swallow whole.   Biotin 10 MG TABS Take by mouth.   cephALEXin  (KEFLEX ) 500 MG capsule Take 1 capsule (500 mg total) by mouth 2 (two) times daily for 7 days.   cholecalciferol (VITAMIN D3) 25 MCG (1000 UNIT) tablet Take 5,000 Units by mouth daily.   cyclobenzaprine  (FLEXERIL ) 5 MG tablet Take 1 tablet (5 mg total) by mouth 3 (three) times daily as needed for muscle spasms.   diltiazem  (CARDIZEM  CD) 180 MG 24 hr capsule Take 180 mg by mouth daily.   estradiol  (ESTRACE ) 0.1 MG/GM vaginal cream PLACE 0.5 G VAGINALLY 2 (TWO) TIMES A WEEK. PLACE 0.5G NIGHTLY FOR TWO WEEKS THEN TWICE A WEEK AFTER   Evolocumab  (REPATHA  SURECLICK) 140 MG/ML SOAJ Inject 140 mg into the skin every 14 (fourteen) days.   FLUoxetine  (PROZAC ) 10 MG tablet Take 1 tablet (10 mg total) by mouth daily.   fluticasone  (FLONASE ) 50 MCG/ACT nasal spray SPRAY 2 SPRAYS INTO EACH NOSTRIL EVERY DAY   gabapentin  (NEURONTIN ) 300 MG capsule Take 1 capsule twice daily  ibuprofen  (ADVIL ) 600 MG tablet Take 1 tablet (600 mg total) by mouth every 8 (eight) hours as needed.   metoprolol  succinate (TOPROL  XL) 25 MG 24 hr tablet Take 1 tablet (25 mg total) by mouth daily.   Multiple Vitamin (MULTIVITAMIN) tablet Take 1 tablet by mouth daily.   ondansetron  (ZOFRAN ) 4 MG tablet TAKE 1 TABLET BY MOUTH EVERY 8 HOURS AS NEEDED FOR NAUSEA AND VOMITING   pantoprazole  (PROTONIX ) 40 MG tablet Take 1 tablet (40 mg total) by mouth daily.   promethazine  (PHENERGAN ) 25 MG tablet TAKE 1 TABLET BY MOUTH EVERY 6 HOURS AS NEEDED FOR  NAUSEA OR VOMITING.   RABEprazole  (ACIPHEX ) 20 MG tablet Take 20 mg by mouth daily.   sucralfate  (CARAFATE ) 1 g tablet Take 1 g by mouth 4 (four) times daily.   traZODone  (DESYREL ) 50 MG tablet TAKE 1/2 TO 1 TABLET BY MOUTH AT BEDTIME AS NEEDED FOR SLEEP   Vitamin E (VITAMIN E/D-ALPHA NATURAL) 268 MG (400 UNIT) CAPS Take by mouth.     Allergies:   Sulfamethoxazole, Amitriptyline , Atorvastatin , Cymbalta  [duloxetine  hcl], Ezetimibe , Lactose intolerance (gi), Lexapro  [escitalopram  oxalate], Rosuvastatin , Topamax  [topiramate ], Valsartan , and Prednisone    ROS:   Please see the history of present illness.    All other systems reviewed and are negative.  EKGs/Labs/Other Studies Reviewed:    The following studies were reviewed today: Cardiac Studies & Procedures   CARDIAC CATHETERIZATION  CARDIAC CATHETERIZATION 01/01/2022  Narrative Normal right heart pressures including normal PA pressures with no evidence of pulmonary hypertension  RA mean 0 RV 31/1 PA 20/2 mean 11 Pulmonary wedge pressure mean 5  Pulmonary artery oxygen saturation 76% SVC oxygen saturation 76% Aortic oxygen saturation 99%  Cardiac output 5.5 L/min Cardiac index 3.2 L/min/m  Findings Coronary Findings Diagnostic  Dominance: Right  No diagnostic findings have been documented. Intervention  No interventions have been documented.   STRESS TESTS  MYOCARDIAL PERFUSION IMAGING 07/09/2016  Narrative  Nuclear stress EF: 83%.  The patient walked for 4 minutes of a Bruce protocol. Peak HR of 185 whicih is 97% PMHR. RBBB. No ST or T wave changes.  The study is normal.  This is a low risk study.  The left ventricular ejection fraction is hyperdynamic (>65%).  ECHOCARDIOGRAM  ECHOCARDIOGRAM COMPLETE 02/18/2023  Narrative ECHOCARDIOGRAM REPORT    Patient Name:   Megan [name] Date of Exam: 02/18/2023 Medical Rec #:  [id number]           Height:       66.0 in Accession #:    [id number]           Weight:       146.0 lb Date of Birth:  [birth year]          BSA:          1.749 m Patient Age:    71 years            BP:           161/89 mmHg Patient Gender: F                   HR:           80 bpm. 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 Signs/Symptoms:Murmur; Risk Factors:Hypertension and HLD.  Sonographer:    Waldo Guadalajara RCS Referring Phys: [phone] Anniebelle Devore  IMPRESSIONS   1. Left ventricular ejection fraction, by estimation, is 60 to 65%. The left ventricle has normal function. The left ventricle has no regional wall motion abnormalities. There is mild left ventricular hypertrophy. Left ventricular diastolic parameters were normal. 2. Right ventricular systolic function is normal. The right ventricular size is normal. There is normal pulmonary artery systolic pressure. 3. The mitral valve is normal in structure. No evidence of mitral valve regurgitation. 4. The aortic valve was not well visualized. Aortic valve regurgitation is not visualized.  FINDINGS Left Ventricle: Left ventricular ejection fraction, by estimation, is 60 to 65%. The left ventricle has normal function. The left ventricle has no regional wall motion abnormalities. The left ventricular internal cavity size was normal in size. There is mild left ventricular hypertrophy. Left ventricular diastolic parameters were normal.  Right Ventricle: The right ventricular size is normal. Right ventricular systolic function is normal. There is normal pulmonary artery systolic pressure. The tricuspid regurgitant velocity is 2.77 m/s, and with an assumed right atrial pressure of 3 mmHg, the estimated right ventricular systolic pressure is 33.7 mmHg.  Left Atrium: Left atrial size was normal in size.  Right Atrium: Right  atrial size was normal in size.  Pericardium: There is no evidence of pericardial effusion.  Mitral Valve: The mitral valve is normal in structure. No evidence of mitral valve regurgitation.  Tricuspid Valve: The tricuspid valve is normal in structure. Tricuspid valve regurgitation is mild.  Aortic Valve: The aortic valve was not well visualized. Aortic valve regurgitation is not visualized.  Pulmonic Valve: The pulmonic valve was normal in structure. Pulmonic valve regurgitation is not visualized.  Aorta: The aortic root and ascending aorta are structurally normal, with no evidence of dilitation.  IAS/Shunts: No atrial level shunt detected by color flow Doppler.   LEFT VENTRICLE PLAX 2D LVIDd:         3.60 cm   Diastology LVIDs:         2.10 cm   LV e' medial:    8.81 cm/s LV PW:         1.00 cm   LV E/e' medial:  10.3 LV IVS:        1.10 cm   LV e' lateral:   8.38 cm/s LVOT diam:     1.60 cm   LV E/e' lateral: 10.8 LV SV:         52 LV SV Index:   30 LVOT Area:     2.01 cm  3D Volume EF: 3D EF:        58 % LV EDV:       95 ml LV ESV:       40 ml LV SV:        55 ml  RIGHT VENTRICLE RV Basal diam:  3.10 cm RV S prime:     14.10 cm/s TAPSE (M-mode): 2.1 cm RVSP:           33.7 mmHg  LEFT ATRIUM             Index        RIGHT ATRIUM           Index LA diam:        4.00 cm 2.29 cm/m   RA Pressure: 3.00 mmHg LA Vol (A2C):   33.4 ml 19.09 ml/m  RA Area:     8.12 cm LA Vol (A4C):   26.2  ml 14.98 ml/m  RA Volume:   12.80 ml  7.32 ml/m LA Biplane Vol: 30.6 ml 17.49 ml/m AORTIC VALVE LVOT Vmax:   106.00 cm/s LVOT Vmean:  73.900 cm/s LVOT VTI:    0.259 m  AORTA Ao Root diam: 3.00 cm Ao Asc diam:  2.90 cm  MITRAL VALVE               TRICUSPID VALVE MV Area (PHT):             TR Peak grad:   30.7 mmHg MV Decel Time:             TR Vmax:        277.00 cm/s MV E velocity: 90.70 cm/s  Estimated RAP:  3.00 mmHg MV A velocity: 98.50 cm/s  RVSP:           33.7 mmHg MV  E/A ratio:  0.92 SHUNTS Systemic VTI:  0.26 m Systemic Diam: 1.60 cm  Ronal Ross Electronically signed by Ronal Ross Signature Date/Time: 02/18/2023/10:37:59 AM    Final   MONITORS  LONG TERM MONITOR (3-14 DAYS) 03/17/2023  Narrative Patch Wear Time:  11 days and 9 hours (2024-07-13T09:13:37-0400 to 2024-07-24T19:07:25-0400)  Patient had a min HR of 52 bpm, max HR of 167 bpm, and avg HR of 77 bpm. Predominant underlying rhythm was Sinus Rhythm. Slight P wave morphology changes were noted. 87 Supraventricular Tachycardia runs occurred, the run with the fastest interval lasting 5 beats with a max rate of 167 bpm, the longest lasting 19 beats with an avg rate of 103 bpm. Isolated SVEs were occasional (1.6%, 20101), SVE Couplets were rare (<1.0%, 551), and SVE Triplets were rare (<1.0%, 197). Isolated VEs were rare (<1.0%), VE Couplets were rare (<1.0%), and no VE Triplets were present.  SUMMARY: as above, normal sinus rhythm with average HR of 77 bpm. Occasional supraventrcular ectopics and supraventricular runs as long as 19 beats. Rare ventricular ectopics. No sustained arrhythmia. No afib or flutter.  CT SCANS  CT CORONARY FRACTIONAL FLOW RESERVE DATA PREP 12/26/2021  Narrative EXAM: CT FFR ANALYSIS  CLINICAL DATA:  abnormal coronary CT  FINDINGS: FFRct analysis was performed on the original cardiac CT angiogram dataset. Diagrammatic representation of the FFRct analysis is provided in a separate PDF document in PACS. This dictation was created using the PDF document and an interactive 3D model of the results. 3D model is not available in the EMR/PACS. Normal FFR range is >0.80. Indeterminate (grey) zone is 0.76-0.80. FFR delta of 0.13 is considered significant.  1. Left Main: FFR = 0.98  2. LAD: Proximal FFR = 0.96, mid FFR = 0.86, distal FFR = not mapped, D3 FFR = 0.90 3. LCX: Proximal FFR = 0.94, distal FFR = 0.92 4. RCA: Proximal FFR = 0.98, mid FFR =0.97, Distal FFR  = 0.97  IMPRESSION: 1.  CT FFR analysis showed no significant stenosis.  RECOMMENDATIONS: Guideline-directed medical therapy and aggressive risk factor modification for secondary prevention of coronary artery disease.   Electronically Signed By: Soyla Merck M.D. On: 12/26/2021 10:30   CT SCANS  CT CORONARY MORPH W/CTA COR W/SCORE 12/24/2021  Addendum 12/26/2021 10:28 AM ADDENDUM REPORT: 12/26/2021 10:26  HISTORY: Chest pain, nonspecific  EXAM: Cardiac/Coronary  CT  TECHNIQUE: The patient was scanned on a [employer].  PROTOCOL: A 120 kV prospective scan was triggered in the descending thoracic aorta at 111 HU's. Axial non-contrast 3 mm slices were carried out through the heart. The data set was  analyzed on a dedicated work station and scored using the Agatston method. Gantry rotation speed was 250 msecs and collimation was .6 mm. Beta blockade and 0.8 mg of sl NTG was given. The 3D data set was reconstructed in 5% intervals of the 35-75 % of the R-R cycle. Systolic and diastolic phases were analyzed on a dedicated work station using MPR, MIP and VRT modes. The patient received 95mL OMNIPAQUE  IOHEXOL  350 MG/ML SOLN contrast.  FINDINGS: Image quality: Good  Noise artifact is: Mild misregistration due to cardiac motion  Coronary calcium  score is 115, which places the patient in the 72 percentile for age and sex matched control.  Coronary arteries: Normal coronary origins.  Right dominance.  Right Coronary Artery: Minimal scattered atherosclerotic plaques in mid and distal RCA, <25% stenosis. Patent RPLA and RPDA.  Left Main Coronary Artery: No detectable plaque or stenosis.  Left Anterior Descending Coronary Artery: Mild mixed atherosclerotic plaque in the proximal and mid LAD, 25-49% stenosis. Moderate mixed atherosclerotic plaque in the mid-distal LAD 50-69% stenosis just beyond the bifurcation of the third diagonal artery. D3 has a  mild atherosclerotic plaque at the level of the bifurcation.  Left Circumflex Artery: Moderate mixed atherosclerotic plaque in the proximal LCx, 50-69% stenosis. May be subject to blooming artifact from calcium  however calcium  honing sequence (sharp) used for interpretation.  Aorta: Normal size, 33 mm at the mid ascending aorta (level of the PA bifurcation) measured double oblique. Mild calcifications. No dissection.  Aortic Valve: No calcifications.  Other findings:  Normal pulmonary vein drainage into the left atrium.  Normal left atrial appendage without thrombus.  Normal size of the pulmonary artery.  IMPRESSION: 1. Moderate CAD in the mid-distal LAD and proximal LCx, CADRADS = 3. CT FFR will be performed and reported separately.  2. Coronary calcium  score is 115, which places the patient in the 72 percentile for age and sex matched control.  3. Normal coronary origins with right dominance.   Electronically Signed By: Soyla Merck M.D. On: 12/26/2021 10:26  Narrative EXAM: OVER-READ INTERPRETATION  CT CHEST  The following report is an over-read performed by radiologist Dr. Franky Crease of [facility] on 12/24/2021. This over-read does not include interpretation of cardiac or coronary anatomy or pathology. The coronary CTA interpretation by the cardiologist is attached.  COMPARISON:  None Available.  FINDINGS: Vascular: Heart is normal size. Scattered calcifications in the descending thoracic aorta. No aneurysm.  Mediastinum/Nodes: No adenopathy  Lungs/Pleura: No confluent opacities or effusions.  Upper Abdomen: No acute findings  Musculoskeletal: Chest wall soft tissues are unremarkable. No acute bony abnormality.  IMPRESSION: Scattered calcifications in the descending thoracic aorta.  No acute extra cardiac abnormality.  Electronically Signed: By: Franky Crease M.D. On: 12/24/2021 19:05          EKG:   EKG  Interpretation Date/Time:  Monday August 24 2023 13:46:07 EST Ventricular Rate:  73 PR Interval:  162 QRS Duration:  110 QT Interval:  416 QTC Calculation: 458 R Axis:   83  Text Interpretation: Normal sinus rhythm with sinus arrhythmia Right bundle branch block When compared with ECG of 25-Apr-2023 09:23, PREVIOUS ECG IS PRESENT No significant change was found Confirmed by Wonda Sharper [id number]) on 08/24/2023 1:52:58 PM    Recent Labs: 12/18/2022: B Natriuretic Peptide 81.0 02/24/2023: TSH 0.873 04/25/2023: ALT 15; BUN 5; Creatinine, Ser 0.72; Hemoglobin 13.3; Platelets 339; Potassium 4.0; Sodium 138  Recent Lipid Panel    Component Value Date/Time   CHOL  105 09/11/2022 0815   TRIG 125 09/11/2022 0815   HDL 51 09/11/2022 0815   CHOLHDL 2.1 09/11/2022 0815   CHOLHDL 3 07/29/2021 0803   VLDL 17.4 07/29/2021 0803   LDLCALC 32 09/11/2022 0815   LDLDIRECT 165.0 06/30/2011 1153           Physical Exam:    VS:  BP (!) 140/80   Pulse 73   Ht 5' 6 (1.676 m)   Wt 150 lb 6.4 oz (68.2 kg)   SpO2 97%   BMI 24.28 kg/m     Wt Readings from Last 3 Encounters:  08/24/23 150 lb 6.4 oz (68.2 kg)  08/20/23 150 lb 6.4 oz (68.2 kg)  04/28/23 145 lb 12.8 oz (66.1 kg)     GEN:  Well nourished, well developed in no acute distress HEENT: Normal NECK: No JVD; No carotid bruits LYMPHATICS: No lymphadenopathy CARDIAC: RRR, no murmurs, rubs, gallops RESPIRATORY:  Clear to auscultation without rales, wheezing or rhonchi  ABDOMEN: Soft, non-tender, non-distended MUSCULOSKELETAL:  No edema; No deformity  SKIN: Warm and dry NEUROLOGIC:  Alert and oriented x 3 PSYCHIATRIC:  Normal affect   Assessment & Plan Tricuspid valve insufficiency, unspecified etiology Only mild TR on recent echo - I reviewed this today and reassured the patient. Plan clinical follow-up in one year.  Coronary artery disease involving native coronary artery of native heart without angina pectoris Nonobstructive CAD on  CTA study. No angina. Continue ASA and repatha . LDL < 55 mg/dL.  Palpitations Stable, monitor reviewed with no afib or sustained arrhythmia.  Essential hypertension BP somewhat labile but no wide swings. Continue metoprolol  and diltiazem  at current doses. Stress/anxiety play a significant role.  Hyperlipidemia, mixed Continue repatha . Tolerating well. LDL at goal < 55 mg/dL.       Medication Adjustments/Labs and Tests Ordered: Current medicines are reviewed at length with the patient today.  Concerns regarding medicines are outlined above.  Orders Placed This Encounter  Procedures   EKG 12-Lead   No orders of the defined types were placed in this encounter.   Patient Instructions   Follow-Up: At [facility], you and your health needs are our priority.  As part of our continuing mission to provide you with exceptional heart care, we have created designated Provider Care Teams.  These Care Teams include your primary Cardiologist (physician) and Advanced Practice Providers (APPs -  Physician Assistants and Nurse Practitioners) who all work together to provide you with the care you need, when you need it.  We recommend signing up for the patient portal called MyChart.  Sign up information is provided on this After Visit Summary.  MyChart is used to connect with patients for Virtual Visits (Telemedicine).  Patients are able to view lab/test results, encounter notes, upcoming appointments, etc.  Non-urgent messages can be sent to your provider as well.   To learn more about what you can do with MyChart, go to [email or web address].    Your next appointment:   1 year(s)  Provider:   Ozell Fell      Signed, Ozell Fell, MD  08/24/2023 4:23 PM    [facility]

## 2023-08-24 NOTE — Assessment & Plan Note (Signed)
 Only mild TR on recent echo - I reviewed this today and reassured the patient. Plan clinical follow-up in one year.

## 2023-08-24 NOTE — Addendum Note (Signed)
 Addended by: Freddy Finner on: 08/24/2023 12:17 PM   Modules accepted: Orders

## 2023-08-24 NOTE — Assessment & Plan Note (Signed)
 Stable, monitor reviewed with no afib or sustained arrhythmia.

## 2023-08-27 ENCOUNTER — Encounter: Payer: Self-pay | Admitting: Family Medicine

## 2023-08-27 DIAGNOSIS — K649 Unspecified hemorrhoids: Secondary | ICD-10-CM

## 2023-08-28 MED ORDER — HYDROCORTISONE (PERIANAL) 2.5 % EX CREA: 1.0000 | TOPICAL_CREAM | Freq: Two times a day (BID) | CUTANEOUS | 0 refills | Status: AC

## 2023-08-31 ENCOUNTER — Other Ambulatory Visit: Payer: Self-pay | Admitting: Family Medicine

## 2023-08-31 DIAGNOSIS — F418 Other specified anxiety disorders: Secondary | ICD-10-CM

## 2023-09-07 ENCOUNTER — Other Ambulatory Visit (HOSPITAL_COMMUNITY)
Admission: RE | Admit: 2023-09-07 | Discharge: 2023-09-07 | Disposition: A | Discharge status: Home / Self Care | Payer: Medicare HMO | Source: Ambulatory Visit | Attending: Obstetrics and Gynecology | Admitting: Obstetrics and Gynecology

## 2023-09-07 ENCOUNTER — Ambulatory Visit: Payer: Medicare HMO | Admitting: Obstetrics and Gynecology

## 2023-09-07 ENCOUNTER — Other Ambulatory Visit (HOSPITAL_COMMUNITY)
Admission: RE | Admit: 2023-09-07 | Discharge: 2023-09-07 | Disposition: A | Discharge status: Home / Self Care | Payer: Medicare HMO | Source: Other Acute Inpatient Hospital | Attending: Obstetrics and Gynecology | Admitting: Obstetrics and Gynecology

## 2023-09-07 ENCOUNTER — Encounter: Payer: Self-pay | Admitting: Obstetrics and Gynecology

## 2023-09-07 VITALS — BP 138/74 | HR 80

## 2023-09-07 DIAGNOSIS — R350 Frequency of micturition: Secondary | ICD-10-CM

## 2023-09-07 DIAGNOSIS — R102 Pelvic and perineal pain: Secondary | ICD-10-CM

## 2023-09-07 DIAGNOSIS — N898 Other specified noninflammatory disorders of vagina: Secondary | ICD-10-CM | POA: Diagnosis not present

## 2023-09-07 DIAGNOSIS — B3731 Acute candidiasis of vulva and vagina: Secondary | ICD-10-CM | POA: Diagnosis not present

## 2023-09-07 LAB — POCT URINALYSIS DIPSTICK
Bilirubin, UA: NEGATIVE
Blood, UA: NEGATIVE
Glucose, UA: NEGATIVE
Ketones, UA: NEGATIVE
Leukocytes, UA: NEGATIVE
Nitrite, UA: NEGATIVE
Protein, UA: NEGATIVE
Spec Grav, UA: 1.01 (ref 1.010–1.025)
Urobilinogen, UA: 0.2 U/dL
pH, UA: 5.5 (ref 5.0–8.0)

## 2023-09-07 MED ORDER — DIAZEPAM 5 MG PO TABS: ORAL_TABLET | ORAL | 0 refills | Status: DC
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
## 2023-09-07 NOTE — Patient Instructions (Signed)
Use the vaginal estrogen inside the vagina when you feel like you are having the spasms. You want to try to limit the use of this medication as it can be habit forming.   Use it in the vagina when you have spasms, are having trouble relaxing your pelvic floor or have trouble urinating.   We will call you with the results of your urine test and the vaginal swab. If you need treatment I will send it to the pharmacy.   You are such a good person and I hope your day is better.

## 2023-09-08 ENCOUNTER — Other Ambulatory Visit: Payer: Self-pay | Admitting: Obstetrics and Gynecology

## 2023-09-08 ENCOUNTER — Encounter: Payer: Self-pay | Admitting: Obstetrics and Gynecology

## 2023-09-08 LAB — URINE CULTURE: Culture: NO GROWTH

## 2023-09-08 LAB — CERVICOVAGINAL ANCILLARY ONLY
Bacterial Vaginitis (gardnerella): NEGATIVE
Candida Glabrata: NEGATIVE
Candida Vaginitis: POSITIVE — AB
Chlamydia: NEGATIVE
Comment: NEGATIVE
Comment: NEGATIVE
Comment: NEGATIVE
Comment: NEGATIVE
Comment: NEGATIVE
Comment: NORMAL
Neisseria Gonorrhea: NEGATIVE
Trichomonas: NEGATIVE

## 2023-09-08 MED ORDER — FLUCONAZOLE 150 MG PO TABS: 150.0000 mg | ORAL_TABLET | Freq: Once | ORAL | 0 refills | Status: AC

## 2023-09-08 NOTE — Progress Notes (Signed)
Please call and inform patient she has a yeast infection but no UTI. Will send in a dose of diflucan.

## 2023-09-11 ENCOUNTER — Inpatient Hospital Stay
Admission: RE | Admit: 2023-09-11 | Discharge: 2023-09-11 | Disposition: A | Discharge status: Home / Self Care | Payer: Medicare HMO | Source: Ambulatory Visit | Attending: Family Medicine | Admitting: Family Medicine

## 2023-09-14 ENCOUNTER — Other Ambulatory Visit: Payer: Self-pay | Admitting: Family Medicine

## 2023-09-14 DIAGNOSIS — J4520 Mild intermittent asthma, uncomplicated: Secondary | ICD-10-CM

## 2023-10-05 ENCOUNTER — Other Ambulatory Visit: Payer: Self-pay | Admitting: Neurology

## 2023-10-05 ENCOUNTER — Other Ambulatory Visit: Payer: Self-pay | Admitting: Family Medicine

## 2023-10-05 DIAGNOSIS — K581 Irritable bowel syndrome with constipation: Secondary | ICD-10-CM

## 2023-10-05 NOTE — Telephone Encounter (Signed)
Megan [name],  Your last note left me and Haleigh a little confused. Is she supposed to be on 20 or 30mg ? There isn't a rx for cymbalta currently on med list.   Please clarify what you meant in the note.  Last note stated: Restart Cymbalta only 20 mg daily, would help her mood, neuromuscular/nerve type neck, head pain.  We restarted in March 30 mg, had constipation, but now on new constipation medication, wants to try again

## 2023-10-12 ENCOUNTER — Inpatient Hospital Stay: Admission: RE | Admit: 2023-10-12 | Payer: Medicare HMO | Source: Ambulatory Visit

## 2023-10-13 ENCOUNTER — Encounter: Payer: Self-pay | Admitting: Family Medicine

## 2023-10-13 ENCOUNTER — Ambulatory Visit (INDEPENDENT_AMBULATORY_CARE_PROVIDER_SITE_OTHER): Payer: Medicare HMO | Admitting: Family Medicine

## 2023-10-13 VITALS — BP 139/68 | HR 89 | Temp 98.6°F | Resp 18 | Wt 153.8 lb

## 2023-10-13 DIAGNOSIS — E042 Nontoxic multinodular goiter: Secondary | ICD-10-CM | POA: Diagnosis not present

## 2023-10-13 DIAGNOSIS — J4521 Mild intermittent asthma with (acute) exacerbation: Secondary | ICD-10-CM

## 2023-10-13 DIAGNOSIS — J101 Influenza due to other identified influenza virus with other respiratory manifestations: Secondary | ICD-10-CM | POA: Insufficient documentation

## 2023-10-13 DIAGNOSIS — I10 Essential (primary) hypertension: Secondary | ICD-10-CM

## 2023-10-13 LAB — POCT INFLUENZA A/B
Influenza A, POC: POSITIVE — AB
Influenza B, POC: NEGATIVE

## 2023-10-13 LAB — POC COVID19 BINAXNOW: SARS Coronavirus 2 Ag: NEGATIVE

## 2023-10-13 MED ORDER — OSELTAMIVIR PHOSPHATE 75 MG PO CAPS
75.0000 mg | ORAL_CAPSULE | Freq: Two times a day (BID) | ORAL | 0 refills | Status: DC
Start: 2023-10-13 — End: 2023-12-07

## 2023-10-13 MED ORDER — PREDNISONE 20 MG PO TABS: 20.0000 mg | ORAL_TABLET | Freq: Every day | ORAL | 0 refills | Status: DC

## 2023-10-13 NOTE — Assessment & Plan Note (Signed)
 I recommend Megan [name] use her albuterol inhaler every 4 hours while awake for 48 hours, then resume PRN. I will add a 5-day course of prednisone, with caution, as she has had tachycardia with prednisone int he past.

## 2023-10-13 NOTE — Assessment & Plan Note (Signed)
 BP is acceptable. Continue diltiazem CD 180 mg daily and metoprolol succinate 25 mg daily

## 2023-10-13 NOTE — Assessment & Plan Note (Signed)
 Discussed home care for viral illness, including rest, pushing fluids, and OTC medications as needed for symptom relief. Recommend hot tea with honey for sore throat symptoms. I will prescribe a 5-day course of Tamiflu. Follow-up if needed for worsening or persistent symptoms.

## 2023-10-13 NOTE — Assessment & Plan Note (Signed)
 Megan [name] will reschedule her biopsy when she is feeling better.
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
## 2023-10-15 ENCOUNTER — Encounter: Payer: Self-pay | Admitting: Family Medicine

## 2023-10-15 DIAGNOSIS — K581 Irritable bowel syndrome with constipation: Secondary | ICD-10-CM

## 2023-10-16 ENCOUNTER — Emergency Department (HOSPITAL_COMMUNITY): Payer: Medicare (Managed Care)

## 2023-10-16 ENCOUNTER — Encounter: Payer: Self-pay | Admitting: Family Medicine

## 2023-10-16 ENCOUNTER — Emergency Department (HOSPITAL_COMMUNITY)
Admission: EM | Admit: 2023-10-16 | Discharge: 2023-10-16 | Disposition: A | Discharge status: Home / Self Care | Payer: Medicare (Managed Care) | Attending: Emergency Medicine | Admitting: Emergency Medicine

## 2023-10-16 ENCOUNTER — Encounter (HOSPITAL_COMMUNITY): Payer: Self-pay

## 2023-10-16 ENCOUNTER — Other Ambulatory Visit: Payer: Self-pay

## 2023-10-16 DIAGNOSIS — Z7982 Long term (current) use of aspirin: Secondary | ICD-10-CM | POA: Diagnosis not present

## 2023-10-16 DIAGNOSIS — J45901 Unspecified asthma with (acute) exacerbation: Secondary | ICD-10-CM | POA: Insufficient documentation

## 2023-10-16 DIAGNOSIS — J101 Influenza due to other identified influenza virus with other respiratory manifestations: Secondary | ICD-10-CM | POA: Insufficient documentation

## 2023-10-16 DIAGNOSIS — J45909 Unspecified asthma, uncomplicated: Secondary | ICD-10-CM | POA: Diagnosis not present

## 2023-10-16 DIAGNOSIS — J4521 Mild intermittent asthma with (acute) exacerbation: Secondary | ICD-10-CM

## 2023-10-16 DIAGNOSIS — R079 Chest pain, unspecified: Secondary | ICD-10-CM | POA: Diagnosis present

## 2023-10-16 DIAGNOSIS — R0789 Other chest pain: Secondary | ICD-10-CM | POA: Diagnosis not present

## 2023-10-16 LAB — BASIC METABOLIC PANEL
Anion gap: 15 (ref 5–15)
BUN: 5 mg/dL — ABNORMAL LOW (ref 8–23)
CO2: 25 mmol/L (ref 22–32)
Calcium: 9.5 mg/dL (ref 8.9–10.3)
Chloride: 95 mmol/L — ABNORMAL LOW (ref 98–111)
Creatinine, Ser: 0.6 mg/dL (ref 0.44–1.00)
GFR, Estimated: >60 mL/min (ref >60)
Glucose, Bld: 121 mg/dL — ABNORMAL HIGH (ref 70–99)
Potassium: 3 mmol/L — ABNORMAL LOW (ref 3.5–5.1)
Sodium: 135 mmol/L (ref 135–145)

## 2023-10-16 LAB — CBC
HCT: 39.1 % (ref 36.0–46.0)
Hemoglobin: 13.2 g/dL (ref 12.0–15.0)
MCH: 31.4 pg (ref 26.0–34.0)
MCHC: 33.8 g/dL (ref 30.0–36.0)
MCV: 92.9 fL (ref 80.0–100.0)
Platelets: 284 10*3/uL (ref 150–400)
RBC: 4.21 MIL/uL (ref 3.87–5.11)
RDW: 12.2 % (ref 11.5–15.5)
WBC: 14.6 10*3/uL — ABNORMAL HIGH (ref 4.0–10.5)
nRBC: 0 % (ref 0.0–0.2)

## 2023-10-16 LAB — TROPONIN I (HIGH SENSITIVITY): Troponin I (High Sensitivity): 6 ng/L (ref <18)

## 2023-10-16 MED ORDER — IPRATROPIUM-ALBUTEROL 0.5-2.5 (3) MG/3ML IN SOLN
3.0000 mL | Freq: Once | RESPIRATORY_TRACT | Status: AC
  Administered 2023-10-16: 3 mL via RESPIRATORY_TRACT
  Filled 2023-10-16: qty 3

## 2023-10-16 MED ORDER — DEXTROMETHORPHAN HBR 15 MG/5ML PO SYRP: 10.0000 mL | ORAL_SOLUTION | Freq: Four times a day (QID) | ORAL | 0 refills | Status: DC | PRN

## 2023-10-16 MED ORDER — ONDANSETRON HCL 4 MG PO TABS
ORAL_TABLET | ORAL | 3 refills | Status: DC
Start: 2023-10-16 — End: 2024-04-11

## 2023-10-16 MED ORDER — ACETAMINOPHEN 500 MG PO TABS
1000.0000 mg | ORAL_TABLET | Freq: Once | ORAL | Status: AC
  Administered 2023-10-16: 1000 mg via ORAL
  Filled 2023-10-16: qty 2

## 2023-10-16 MED ORDER — MAGNESIUM SULFATE 2 GM/50ML IV SOLN
2.0000 g | Freq: Once | INTRAVENOUS | Status: AC
  Administered 2023-10-16: 2 g via INTRAVENOUS
  Filled 2023-10-16: qty 50

## 2023-10-16 MED ORDER — SODIUM CHLORIDE 0.9 % IV BOLUS
1000.0000 mL | Freq: Once | INTRAVENOUS | Status: AC
  Administered 2023-10-16: 1000 mL via INTRAVENOUS

## 2023-10-16 MED ORDER — BENZONATATE 100 MG PO CAPS: 100.0000 mg | ORAL_CAPSULE | Freq: Three times a day (TID) | ORAL | 0 refills | Status: DC

## 2023-10-16 MED ORDER — BENZONATATE 100 MG PO CAPS
100.0000 mg | ORAL_CAPSULE | Freq: Once | ORAL | Status: AC
  Administered 2023-10-16: 100 mg via ORAL
  Filled 2023-10-16: qty 1

## 2023-10-16 MED ORDER — GUAIFENESIN ER 600 MG PO TB12: 600.0000 mg | ORAL_TABLET | Freq: Two times a day (BID) | ORAL | 0 refills | Status: DC

## 2023-10-16 MED ORDER — PREDNISONE 20 MG PO TABS: 20.0000 mg | ORAL_TABLET | Freq: Every day | ORAL | 0 refills | Status: DC

## 2023-10-16 NOTE — Discharge Instructions (Signed)
 Your asthma was likely a irritated by the flu, please continue your albuterol inhaler every 6 hours, as needed for your shortness of breath.  Continue the prednisone, and take the Mucinex, to help loosen your cough, make sure you are drinking lots of fluids, and take the Tessalon Perles, or dextromethorphan cough syrup, as needed for your rattling cough.  If you have severe shortness of breath, difficulty in speaking full sentences, or severe chest pain or intractable nausea, vomiting please return to the ER

## 2023-10-16 NOTE — ED Triage Notes (Addendum)
 Pt c/o chest pain across entire chest started yesterday. Pt tested positive for flu a on Tuesday. Pt c/o moist coughx5d. Pt states her cough got worse last night. Pt states took tylenol 650 mg at 1100 today
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
## 2023-10-16 NOTE — ED Notes (Signed)
 Spoke with lab and they are running blood now. They state they are running behind
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
## 2023-10-18 ENCOUNTER — Encounter: Payer: Self-pay | Admitting: Family Medicine

## 2023-10-19 ENCOUNTER — Ambulatory Visit: Payer: Self-pay | Admitting: Family Medicine

## 2023-10-19 ENCOUNTER — Telehealth: Payer: Self-pay

## 2023-10-19 NOTE — Transitions of Care (Post Inpatient/ED Visit) (Signed)
   10/19/2023  Name: Megan [name] MRN: [id number] DOB: 08-10-1951  Today's TOC FU Call Status: Today's TOC FU Call Status:: Unsuccessful Call (1st Attempt) Unsuccessful Call (1st Attempt) Date: 10/19/23  Attempted to reach the patient regarding the most recent Inpatient/ED visit.  Follow Up Plan: Additional outreach attempts will be made to reach the patient to complete the Transitions of Care (Post Inpatient/ED visit) call.   Signature   Agnes Lawrence, CMA (AAMA)  [facility] [phone]

## 2023-10-19 NOTE — Telephone Encounter (Signed)
 Copied from CRM [id number]. Topic: Clinical - Red Word Triage >> Oct 19, 2023  9:07 AM Theodis Sato wrote: Red Word that prompted transfer to Nurse Triage:  Left side of face is swollen, patient calling to schedule her hospital follow up appointment.  *Patient hospitalized for the flu*

## 2023-10-19 NOTE — Telephone Encounter (Signed)
 Reason for Disposition . Face swelling is painful to touch  Answer Assessment - Initial Assessment Questions 1. ONSET: "When did the swelling start?" (e.g., minutes, hours, days)     This morning 2. LOCATION: "What part of the face is swollen?"     Left side of face 3. SEVERITY: "How swollen is it?"     Eye was almost closed 4. ITCHING: "Is there any itching?" If Yes, ask: "How much?"   (Scale 1-10; mild, moderate or severe)     no 5. PAIN: "Is the swelling painful to touch?" If Yes, ask: "How painful is it?"   (Scale 1-10; mild, moderate or severe)   - NONE (0): no pain   - MILD (1-3): doesn't interfere with normal activities    - MODERATE (4-7): interferes with normal activities or awakens from sleep    - SEVERE (8-10): excruciating pain, unable to do any normal activities      Hurts in to left side of head, nostril and neck 6. FEVER: "Do you have a fever?" If Yes, ask: "What is it, how was it measured, and when did it start?"      no 7. CAUSE: "What do you think is causing the face swelling?"     Recent flu diagnosis 8. RECURRENT SYMPTOM: "Have you had face swelling before?" If Yes, ask: "When was the last time?" "What happened that time?"     Not this bad 9. OTHER SYMPTOMS: "Do you have any other symptoms?" (e.g., toothache, leg swelling)     Left ear hurting , left side teeth hurt  Protocols used: Face Swelling-A-AH

## 2023-10-19 NOTE — Telephone Encounter (Signed)
 Pt not on the line when agent attempted to merge call

## 2023-10-19 NOTE — Telephone Encounter (Signed)
 Scheduled to see Dr Doreene Burke on 10/20/23. Dm/cma

## 2023-10-19 NOTE — Telephone Encounter (Signed)
  Chief Complaint: facial swelling, sinus pain Symptoms: facial swelling - left side of face, sinus pain/pressure, ear pain, teeth pain Frequency: recent diagnosis of flu- woke today with facial swelling Pertinent Negatives: Patient denies fever Disposition: [] ED /[] Urgent Care (no appt availability in office) / [x] Appointment(In office/virtual)/ []  [facility]/ [] Home Care/ [] Refused Recommended Disposition /[] [facility]/ []  Follow-up with PCP Additional Notes: Patient has been scheduled- first available appointment in office- tomorrow- will send alert to office to see if patient can be seen today

## 2023-10-20 ENCOUNTER — Encounter: Payer: Self-pay | Admitting: Family Medicine

## 2023-10-20 ENCOUNTER — Ambulatory Visit (INDEPENDENT_AMBULATORY_CARE_PROVIDER_SITE_OTHER): Payer: Medicare (Managed Care) | Admitting: Family Medicine

## 2023-10-20 VITALS — BP 142/78 | HR 71 | Temp 98.1°F | Ht 66.0 in | Wt 151.2 lb

## 2023-10-20 DIAGNOSIS — J0140 Acute pansinusitis, unspecified: Secondary | ICD-10-CM

## 2023-10-20 DIAGNOSIS — H6502 Acute serous otitis media, left ear: Secondary | ICD-10-CM | POA: Diagnosis not present

## 2023-10-20 DIAGNOSIS — J4520 Mild intermittent asthma, uncomplicated: Secondary | ICD-10-CM

## 2023-10-20 MED ORDER — AMOXICILLIN-POT CLAVULANATE 875-125 MG PO TABS: 1.0000 | ORAL_TABLET | Freq: Two times a day (BID) | ORAL | 0 refills | Status: DC

## 2023-10-20 MED ORDER — PREDNISONE 10 MG PO TABS: 10.0000 mg | ORAL_TABLET | Freq: Two times a day (BID) | ORAL | 0 refills | Status: AC

## 2023-10-20 NOTE — Progress Notes (Signed)
 Established Patient Office Visit   Subjective:  Patient ID: Megan [name], female    DOB: October 14, 1950  Age: 73 y.o. MRN: [id number]  Chief Complaint  Patient presents with   Facial Pain    Pt complains of sinus pain and pressure along with facial edema and ear pain x 1 day. Pt was diagnosed with Flu on 2/28. Currently taking otc Mucinex and tylenol.     HPI Encounter Diagnoses  Name Primary?   Acute non-recurrent pansinusitis Yes   Non-recurrent acute serous otitis media of left ear    Mild intermittent reactive airway disease without complication    1 day history of pressure pain in her face, on top of her head and behind her eyes.  There is postnasal drip.  There has been no rhinorrhea.  No fevers or chills.  She is having teeth pain.  She has been taking Mucinex and nasal saline.  The symptoms preceded by an 8-day history of URI signs and symptoms that included a diagnosis of flu and reactive airway disease treated in the emergency room.  Breathing is improved but she continues to wheeze some.  Left ear has been congested.  Hearing is decreased.  There is some discomfort.   Review of Systems  Constitutional: Negative.  Negative for chills and fever.  HENT:  Positive for congestion, ear pain, hearing loss and sinus pain. Negative for ear discharge.   Eyes:  Negative for blurred vision, discharge and redness.  Respiratory:  Positive for cough. Negative for shortness of breath.   Cardiovascular: Negative.   Gastrointestinal:  Negative for abdominal pain.  Genitourinary: Negative.   Musculoskeletal: Negative.  Negative for myalgias.  Skin:  Negative for rash.  Neurological:  Positive for headaches. Negative for tingling, loss of consciousness and weakness.  Endo/Heme/Allergies:  Negative for polydipsia.     Current Outpatient Medications:    albuterol (VENTOLIN HFA) 108 (90 Base) MCG/ACT inhaler, TAKE 2 PUFFS BY MOUTH EVERY 6 HOURS AS NEEDED FOR WHEEZE OR SHORTNESS OF  BREATH, Disp: 18 each, Rfl: 5   ALPRAZolam (XANAX) 1 MG tablet, TAKE 1 TABLET BY MOUTH TWICE A DAY AS NEEDED, Disp: 45 tablet, Rfl: 1   Alum & Mag Hydroxide-Simeth (MYLANTA PO), Take by mouth. PRN, Disp: , Rfl:    Alum Hydroxide-Mag Carbonate (GAVISCON PO), Take by mouth. PRN, Disp: , Rfl:    amoxicillin-clavulanate (AUGMENTIN) 875-125 MG tablet, Take 1 tablet by mouth 2 (two) times daily., Disp: 20 tablet, Rfl: 0   aspirin EC 81 MG tablet, Take 81 mg by mouth daily. Swallow whole., Disp: , Rfl:    Biotin 10 MG TABS, Take by mouth., Disp: , Rfl:    cholecalciferol (VITAMIN D3) 25 MCG (1000 UNIT) tablet, Take 5,000 Units by mouth daily., Disp: , Rfl:    cyclobenzaprine (FLEXERIL) 5 MG tablet, Take 1 tablet (5 mg total) by mouth 3 (three) times daily as needed for muscle spasms., Disp: 60 tablet, Rfl: 1   diazepam (VALIUM) 5 MG tablet, Place 1 tablet vaginally nightly as needed for muscle spasm/ pelvic pain., Disp: 15 tablet, Rfl: 0   diltiazem (CARDIZEM CD) 180 MG 24 hr capsule, Take 180 mg by mouth daily., Disp: , Rfl:    estradiol (ESTRACE) 0.1 MG/GM vaginal cream, PLACE 0.5 G VAGINALLY 2 (TWO) TIMES A WEEK. PLACE 0.5G NIGHTLY FOR TWO WEEKS THEN TWICE A WEEK AFTER, Disp: 42.5 g, Rfl: 11   Evolocumab (REPATHA SURECLICK) 140 MG/ML SOAJ, Inject 140 mg into the skin every  14 (fourteen) days., Disp: 2 mL, Rfl: 11   famotidine (PEPCID) 40 MG tablet, Take 40 mg by mouth daily., Disp: , Rfl:    fluticasone (FLONASE) 50 MCG/ACT nasal spray, SPRAY 2 SPRAYS INTO EACH NOSTRIL EVERY DAY, Disp: 48 mL, Rfl: 6   gabapentin (NEURONTIN) 300 MG capsule, Take 1 capsule twice daily, Disp: 60 capsule, Rfl: 5   guaiFENesin (MUCINEX) 600 MG 12 hr tablet, Take 1 tablet (600 mg total) by mouth 2 (two) times daily., Disp: 14 tablet, Rfl: 0   hydrocortisone (ANUSOL-HC) 2.5 % rectal cream, Place 1 Application rectally 2 (two) times daily., Disp: 30 g, Rfl: 0   ibuprofen (ADVIL) 600 MG tablet, Take 1 tablet (600 mg total) by  mouth every 8 (eight) hours as needed., Disp: 30 tablet, Rfl: 0   metoprolol succinate (TOPROL XL) 25 MG 24 hr tablet, Take 1 tablet (25 mg total) by mouth daily., Disp: 90 tablet, Rfl: 3   Multiple Vitamin (MULTIVITAMIN) tablet, Take 1 tablet by mouth daily., Disp: , Rfl:    ondansetron (ZOFRAN) 4 MG tablet, TAKE 1 TABLET BY MOUTH EVERY 8 HOURS AS NEEDED FOR NAUSEA AND VOMITING, Disp: 20 tablet, Rfl: 3   pantoprazole (PROTONIX) 40 MG tablet, Take 1 tablet (40 mg total) by mouth daily., Disp: 30 tablet, Rfl: 3   predniSONE (DELTASONE) 10 MG tablet, Take 1 tablet (10 mg total) by mouth 2 (two) times daily with a meal for 7 days., Disp: 14 tablet, Rfl: 0   promethazine (PHENERGAN) 25 MG tablet, TAKE 1 TABLET BY MOUTH EVERY 6 HOURS AS NEEDED FOR NAUSEA OR VOMITING., Disp: 30 tablet, Rfl: 0   RABEprazole (ACIPHEX) 20 MG tablet, Take 20 mg by mouth daily., Disp: , Rfl:    sucralfate (CARAFATE) 1 g tablet, Take 1 g by mouth 4 (four) times daily., Disp: , Rfl:    traZODone (DESYREL) 50 MG tablet, TAKE 1/2 TO 1 TABLET BY MOUTH AT BEDTIME AS NEEDED FOR SLEEP, Disp: 90 tablet, Rfl: 3   Vitamin E (VITAMIN E/D-ALPHA NATURAL) 268 MG (400 UNIT) CAPS, Take by mouth., Disp: , Rfl:    benzonatate (TESSALON) 100 MG capsule, Take 1 capsule (100 mg total) by mouth every 8 (eight) hours. (Patient not taking: Reported on 10/20/2023), Disp: 21 capsule, Rfl: 0   dextromethorphan 15 MG/5ML syrup, Take 10 mLs (30 mg total) by mouth 4 (four) times daily as needed for cough. (Patient not taking: Reported on 10/20/2023), Disp: 120 mL, Rfl: 0   DULoxetine (CYMBALTA) 30 MG capsule, Take 1 capsule (30 mg total) by mouth daily., Disp: 90 capsule, Rfl: 0   fluconazole (DIFLUCAN) 150 MG tablet, Take 150 mg by mouth once., Disp: , Rfl:    FLUoxetine (PROZAC) 10 MG tablet, Take 1 tablet (10 mg total) by mouth daily., Disp: 90 tablet, Rfl: 3   oseltamivir (TAMIFLU) 75 MG capsule, Take 1 capsule (75 mg total) by mouth 2 (two) times daily.  (Patient not taking: Reported on 10/20/2023), Disp: 10 capsule, Rfl: 0   Objective:     BP (!) 142/78   Pulse 71   Temp 98.1 F (36.7 C)   Ht 5\' 6"  (1.676 m)   Wt 151 lb 3.2 oz (68.6 kg)   SpO2 97%   BMI 24.40 kg/m    Physical Exam Constitutional:      General: She is not in acute distress.    Appearance: Normal appearance. She is not ill-appearing, toxic-appearing or diaphoretic.  HENT:     Head: Normocephalic  and atraumatic.     Right Ear: Tympanic membrane, ear canal and external ear normal.     Left Ear: Ear canal and external ear normal. A middle ear effusion is present. Tympanic membrane is not injected or erythematous.     Mouth/Throat:     Mouth: Mucous membranes are moist.     Pharynx: Oropharynx is clear. No oropharyngeal exudate or posterior oropharyngeal erythema.  Eyes:     General: No scleral icterus.       Right eye: No discharge.        Left eye: No discharge.     Extraocular Movements: Extraocular movements intact.     Conjunctiva/sclera: Conjunctivae normal.     Pupils: Pupils are equal, round, and reactive to light.  Cardiovascular:     Rate and Rhythm: Normal rate and regular rhythm.  Pulmonary:     Effort: Pulmonary effort is normal. No respiratory distress.     Breath sounds: Wheezing present. No rhonchi or rales.  Musculoskeletal:     Cervical back: No rigidity or tenderness.  Lymphadenopathy:     Cervical: No cervical adenopathy.  Skin:    General: Skin is warm and dry.  Neurological:     Mental Status: She is alert and oriented to person, place, and time.  Psychiatric:        Mood and Affect: Mood normal.        Behavior: Behavior normal.      No results found for any visits on 10/20/23.    The ASCVD Risk score (Arnett DK, et al., 2019) failed to calculate for the following reasons:   The valid total cholesterol range is 130 to 320 mg/dL    Assessment & Plan:   Acute non-recurrent pansinusitis -     Amoxicillin-Pot Clavulanate;  Take 1 tablet by mouth 2 (two) times daily.  Dispense: 20 tablet; Refill: 0  Non-recurrent acute serous otitis media of left ear -     predniSONE; Take 1 tablet (10 mg total) by mouth 2 (two) times daily with a meal for 7 days.  Dispense: 14 tablet; Refill: 0  Mild intermittent reactive airway disease without complication -     predniSONE; Take 1 tablet (10 mg total) by mouth 2 (two) times daily with a meal for 7 days.  Dispense: 14 tablet; Refill: 0    Return Continue nasal saline and Mucinex.  Follow-up with Dr. Veto Kemps if not improving.Mliss Sax, MD

## 2023-10-21 ENCOUNTER — Other Ambulatory Visit: Payer: Self-pay | Admitting: Family Medicine

## 2023-10-21 DIAGNOSIS — F418 Other specified anxiety disorders: Secondary | ICD-10-CM

## 2023-10-22 ENCOUNTER — Encounter: Payer: Self-pay | Admitting: Family Medicine

## 2023-10-23 ENCOUNTER — Other Ambulatory Visit (HOSPITAL_COMMUNITY): Payer: Self-pay

## 2023-10-23 ENCOUNTER — Telehealth: Payer: Self-pay | Admitting: Pharmacy Technician

## 2023-10-23 NOTE — Telephone Encounter (Signed)
 Pharmacy Patient Advocate Encounter   Received notification from Fax that prior authorization for repatha is required/requested.   Insurance verification completed.   The patient is insured through [employer] .   Per test claim: PA required; PA submitted to above mentioned insurance via CoverMyMeds Key/confirmation #/EOC [id number] Status is pending   Rx was filled 10/21/23 as transition

## 2023-10-26 NOTE — Telephone Encounter (Signed)
 Pharmacy Patient Advocate Encounter  Received notification from [employer] that Prior Authorization for repatha has been APPROVED from 10/16/23 to 10/21/24. Spoke to pharmacy to process.Copay is $141.00.    PA #/Case ID/Reference #: [id number]

## 2023-10-27 ENCOUNTER — Other Ambulatory Visit: Payer: Medicare (Managed Care)

## 2023-10-30 ENCOUNTER — Encounter: Payer: Self-pay | Admitting: Family Medicine

## 2023-11-02 ENCOUNTER — Other Ambulatory Visit: Payer: Self-pay | Admitting: Family Medicine

## 2023-11-02 ENCOUNTER — Inpatient Hospital Stay
Admission: RE | Admit: 2023-11-02 | Discharge: 2023-11-02 | Disposition: A | Discharge status: Home / Self Care | Payer: Medicare (Managed Care) | Source: Ambulatory Visit | Attending: Family Medicine | Admitting: Family Medicine

## 2023-11-02 ENCOUNTER — Telehealth: Payer: Self-pay | Admitting: Neurology

## 2023-11-02 DIAGNOSIS — K581 Irritable bowel syndrome with constipation: Secondary | ICD-10-CM

## 2023-11-02 NOTE — Telephone Encounter (Signed)
 Pt said having a headache on left side of head. Since having the flu February and since then have worsening headaches. Have been taking Tylenol and Gabapentin ( that is not working) Would like a call from the nurse for a earlier appointment if available.

## 2023-11-02 NOTE — Telephone Encounter (Signed)
 Call to patient, no answer. Left voicemail and sent mychart message to offer visit

## 2023-11-03 NOTE — Progress Notes (Unsigned)
 Patient: Megan [name] Date of Birth: 11-16-1950  Reason for Visit: Follow up History from: Patient Primary Neurologist: Terrace Arabia   Virtual Visit via Video Note  I connected with Megan [name] on 11/04/23 at  1:00 PM EDT by a video enabled telemedicine application and verified that I am speaking with the correct person using two identifiers.  Location: Patient: at her home Provider: in the office    I discussed the limitations of evaluation and management by telemedicine and the availability of in person appointments. The patient expressed understanding and agreed to proceed.  ASSESSMENT AND PLAN 73 y.o. year old female   1.  Mild cognitive impairment (did not address today 11/04/23) -Stable, MoCA 21/30, denies any changes -B12, TSH, RPR were unremarkable -MRI of the brain in December 2022 showed mild generalized atrophy, small vessel disease -Per father passed of Parkinson's disease at 96 -Most likely early central nervous system degenerative disorder impacted by lifelong history of mood disorder  2.  Left-sided headaches 3.  Mood disorder -Reports worsening headache, left-sided, left occipital, left neck -Increase gabapentin 300 mg AM, 600 mg p.m. -Try Nurtec 75 mg tablet as needed for acute headache treatment, headaches with migraine features -Previously tried and failed: Cymbalta (GI upset), I ordered Ubrelvy but doesn't look like preferred with insurance, Topamax -Has been referred to neurosurgery, recommend follow-up on referral,, MRI cervical spine has shown disc degeneration and spondylosis at C4-5 and C5-6.  Mild spinal stenosis and foraminal stenosis bilaterally at both levels.  See if potential candidate for injection? -Historically, normal ESR, CRP -Next steps: Potential trial of CGRP, encouraged to follow-up with neurosurgery for their consultation -This encounter was done via video visit, if her headaches worsen call to be seen in office, for now keep  appointment with Dr. Terrace Arabia in May  Meds ordered this encounter  Medications   gabapentin (NEURONTIN) 300 MG capsule    Sig: Take 1 in the morning, take 2 at night    Dispense:  90 capsule    Refill:  5   Rimegepant Sulfate (NURTEC) 75 MG TBDP    Sig: Take 1 tablet (75 mg total) by mouth as needed. Take 1 tablet at onset of headache, max is 1 tablet in 24 hours.    Dispense:  8 tablet    Refill:  11   HISTORY  Megan [name] is a 73 years old female,  seen in request by her primary care physician PA Waldon Merl, [place] and ophthalmologist Dr. Sallye Lat for evaluation of headaches, initial evaluation was on April 07, 2018.   She has past medical history of depression anxiety, had intolerance to multiple different agents in the past, including amitriptyline, Effexor, but she denied previous history of headaches, early 2019, she began to have frequent left retro-orbital area pressure headaches, initially thought it was due to sinus issues, was evaluated by ENT Dr. Lazarus Salines, reported normal CT sinus in 2019.   She had left cataract surgery by Dr. Dione Booze in April 2019, left vision overall recovered very well, but since her cataract surgery in April, left-sided headache has become worse, constant pressure, 5 out of 10 on a daily basis, often exacerbated to a more severe pounding headache with associated light noise sensitivity, nauseous, she tends to lying in bed for hours to help her headaches, she has tried over-the-counter ibuprofen and Tylenol with limited help,   We personally reviewed MRI of the brain in May 2017 for evaluation of acute onset right facial  droop and numbness, there was no acute abnormality mild supratentorium small vessel disease,    ESR C-reactive protein were normal in April 2019.   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   Update February 07, 2021 SS: Last seen Dec 2021, started Effexor-XR 37.5 mg, gabapentin 100 mg TID for migraine prevention, Fioricet PRN. CRP, sed rate, ANA were normal. Claims couldn't take Effexor, tried for 3 weeks, felt nauseated, palpations. Taking gabapentin 200 mg 2-3 times a day, does help. Reports daily ache to left parietal area, on good days can be dull, bad days is throbbing. When turns head to right sounds to her like click to left parietal area, not audible to anyone else. With headaches has nausea. At times is overwhelming to her, feels like bad earache but in her head. Has IBS, colitis. Remains on aspirin 81 mg daily. Ice and heat help the parietal area. Claims doesn't do well with antidepressants. Admits to anxiety, is the go getter in her family. Didn't take Fioricet.    Update August 08, 2021 SS: headaches still on left temple/parietal area, everyday, always dull but sometimes sharp pains throbbing. Gets nauseated. 4/7 days are bad headaches. Taking gabapentin 100 mg 3 times daily, the 300 mg twice daily gave her diarrhea with colitis. Claims blurry vision to the left eye, always, blurry vision for last 2 months. The eye doctor has cleared her.  The worse the headache gets the worse it gets. Takes Tylenol without benefit. Cymbalta didn't agree with her, restarted it again 2 weeks ago  to try again, has taken edge off stress. Fioricet made her heart race. A lot of stress with her and her husband, she worries a lot about her family. On aspirin.   UPDATE Dec 18 2021: She continued complaints of significant left-sided headache, tightness sensation, difficulty turning, difficulty lying on her left side, worst at nighttime, she has tried Fioricet, Effexor, Cymbalta, only effective, could not tolerate due to side effect  She is taking gabapentin 300 mg twice daily, last dose was 5 PM, was not sure about the benefit, not a good candidate for NSAIDs due to acid reflux, heating pad always helps,   She also complains of lifelong history of anxiety, is a Product/process development scientist, noticed gradual onset of memory loss, slow reaction time, misplace things, MoCA examination 21/30,   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 Feels headaches are improving, left  sided, mostly to the neck area, heating pad helps. Takes gabapentin 300 mg at bedtime if needed, has tizanidine if needed, rarely if neck tension. Doesn't have daily headaches, but has the neck pain. Is feeling harder of hearing. MOCA 21/30 today. Get words mixed up, poor spelling, will catch herself. B 12 > 2000, TSH 1.690, RPR negative.  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  Update Jan 01, 2023 SS: MOCA 21/30. Continues with left sided headaches, are better, happens once every 2 weeks, can last a few days. Has had left shoulder pain, reports referred to neurosurgery, ENT ordered CT soft tissue neck. Takes gabapentin 300 mg  only once a day. Feels memory is doing okay, its worsens when she gets frustrated. Has been placed on Flexeril today, up to 3 times a day. Tried tizanidine , interacted with her BP med. She tried Cymbalta again in March, it caused constipation, she stopped it.  Stress with her husband.  She looks great today.  With left-sided headaches, is a pressure, throbbing, feels nauseated.  October 2023 MRI cervical spine  IMPRESSION: Disc degeneration and spondylosis at C4-5 and C5-6. Mild spinal stenosis and foraminal stenosis bilaterally at both levels.  Update 11/04/23 SS: Here today for VV reporting worsening left sided headache, left neck, left ear. Taking gabapentin 300 mg BID, stopped Cymbalta due to GI issues 3 months ago. Saw ENT today, reported hearing loss to left ear. Uses ice or heat. Hurts to lay on pillow, feels like throbbing. Claims tried lots of antidepressants, a lot of side effects. Nausea with headache, almost daily.    REVIEW OF SYSTEMS: Out of a complete 14 system review of symptoms, the patient complains only of the following symptoms, and all other reviewed systems are negative.  See HPI  ALLERGIES: Allergies  Allergen Reactions   Sulfamethoxazole Shortness Of Breath    chest tightness   Amitriptyline Anxiety and Other (See Comments)    Depression, insomnia   Atorvastatin     10-80 mg daily - headache, joint pain, muscle pain, GI symptoms   Cymbalta [Duloxetine Hcl] Other (See Comments)    Insomnia, constipation   Ezetimibe     unknown   Lactose Intolerance (Gi) Diarrhea and Nausea And Vomiting   Lexapro [Escitalopram Oxalate]     nausea   Rosuvastatin     10 mg daily - headache, joint pain, muscle pain, GI symptoms   Topamax [Topiramate] Itching   Valsartan     Nausea and dizziness   Prednisone Palpitations    HOME MEDICATIONS: Outpatient Medications Prior to Visit  Medication Sig Dispense Refill   albuterol (VENTOLIN HFA) 108 (90 Base) MCG/ACT inhaler TAKE 2  PUFFS BY MOUTH EVERY 6 HOURS AS NEEDED FOR WHEEZE OR SHORTNESS OF BREATH 18 each 5   ALPRAZolam (XANAX) 1 MG tablet Take 1 tablet (1 mg total) by mouth 2 (two) times daily as needed. 45 tablet 1   Alum & Mag Hydroxide-Simeth (MYLANTA PO) Take by mouth. PRN     Alum Hydroxide-Mag Carbonate (GAVISCON PO) Take by mouth. PRN     amoxicillin-clavulanate (AUGMENTIN) 875-125 MG tablet Take 1 tablet by mouth 2 (two) times daily. 20 tablet 0   aspirin EC 81 MG tablet Take 81 mg by mouth daily. Swallow whole.     benzonatate (TESSALON) 100 MG capsule Take 1 capsule (100 mg total) by mouth every 8 (eight) hours. (Patient not taking: Reported  on 10/20/2023) 21 capsule 0   Biotin 10 MG TABS Take by mouth.     cholecalciferol (VITAMIN D3) 25 MCG (1000 UNIT) tablet Take 5,000 Units by mouth daily.     cyclobenzaprine (FLEXERIL) 5 MG tablet Take 1 tablet (5 mg total) by mouth 3 (three) times daily as needed for muscle spasms. 60 tablet 1   dextromethorphan 15 MG/5ML syrup Take 10 mLs (30 mg total) by mouth 4 (four) times daily as needed for cough. (Patient not taking: Reported on 10/20/2023) 120 mL 0   diazepam (VALIUM) 5 MG tablet Place 1 tablet vaginally nightly as needed for muscle spasm/ pelvic pain. 15 tablet 0   diltiazem (CARDIZEM CD) 180 MG 24 hr capsule Take 180 mg by mouth daily.     estradiol (ESTRACE) 0.1 MG/GM vaginal cream PLACE 0.5 G VAGINALLY 2 (TWO) TIMES A WEEK. PLACE 0.5G NIGHTLY FOR TWO WEEKS THEN TWICE A WEEK AFTER 42.5 g 11   Evolocumab (REPATHA SURECLICK) 140 MG/ML SOAJ Inject 140 mg into the skin every 14 (fourteen) days. 2 mL 11   famotidine (PEPCID) 40 MG tablet Take 40 mg by mouth daily.     fluconazole (DIFLUCAN) 150 MG tablet Take 150 mg by mouth once.     FLUoxetine (PROZAC) 10 MG tablet Take 1 tablet (10 mg total) by mouth daily. 90 tablet 3   fluticasone (FLONASE) 50 MCG/ACT nasal spray SPRAY 2 SPRAYS INTO EACH NOSTRIL EVERY DAY 48 mL 6   gabapentin (NEURONTIN) 300 MG capsule Take 1  capsule twice daily 60 capsule 5   guaiFENesin (MUCINEX) 600 MG 12 hr tablet Take 1 tablet (600 mg total) by mouth 2 (two) times daily. 14 tablet 0   hydrocortisone (ANUSOL-HC) 2.5 % rectal cream Place 1 Application rectally 2 (two) times daily. 30 g 0   ibuprofen (ADVIL) 600 MG tablet Take 1 tablet (600 mg total) by mouth every 8 (eight) hours as needed. 30 tablet 0   metoprolol succinate (TOPROL XL) 25 MG 24 hr tablet Take 1 tablet (25 mg total) by mouth daily. 90 tablet 3   Multiple Vitamin (MULTIVITAMIN) tablet Take 1 tablet by mouth daily.     ondansetron (ZOFRAN) 4 MG tablet TAKE 1 TABLET BY MOUTH EVERY 8 HOURS AS NEEDED FOR NAUSEA AND VOMITING 20 tablet 3   oseltamivir (TAMIFLU) 75 MG capsule Take 1 capsule (75 mg total) by mouth 2 (two) times daily. (Patient not taking: Reported on 10/20/2023) 10 capsule 0   pantoprazole (PROTONIX) 40 MG tablet Take 1 tablet (40 mg total) by mouth daily. 30 tablet 3   promethazine (PHENERGAN) 25 MG tablet TAKE 1 TABLET BY MOUTH EVERY 6 HOURS AS NEEDED FOR NAUSEA OR VOMITING. 30 tablet 0   RABEprazole (ACIPHEX) 20 MG tablet Take 20 mg by mouth daily.     sucralfate (CARAFATE) 1 g tablet Take 1 g by mouth 4 (four) times daily.     traZODone (DESYREL) 50 MG tablet TAKE 1/2 TO 1 TABLET BY MOUTH AT BEDTIME AS NEEDED FOR SLEEP 90 tablet 3   Vitamin E (VITAMIN E/D-ALPHA NATURAL) 268 MG (400 UNIT) CAPS Take by mouth.     DULoxetine (CYMBALTA) 30 MG capsule Take 1 capsule (30 mg total) by mouth daily. 90 capsule 0   No facility-administered medications prior to visit.    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 difficile diarrhea    CAD (coronary artery disease) 02/24/2023   Nonobstructive CAD >> CCTA 12/26/2021: CAC score 115 (72nd percentile);  RCA mid and distal <25, LAD proximal and mid 25-49, mid-distal 50-69, LCx proximal 50-69, ascending aorta 33 mm; FFR normal-medical therapy   Cancer (HCC)    pt denies    Chest pain, atypical 07/14/2011   Cold sore 07/17/2016   Colon polyp    Contact dermatitis 03/23/2012   Cystocele, midline    Depression    Diarrhea    Diverticulosis    Endometrial hyperplasia 02/27/2006   BENIGN ENDO BX ON 02/2007   GERD (gastroesophageal reflux disease)    Headache 04/16/2015   Headache    Headache(784.0) 04/15/2012   pt denies    Heart murmur    Hematuria 04/27/2012   Hepatic cyst    Hiatal hernia    Hiatal hernia    Hyperlipidemia, mixed 12/17/2014   Hypertension    IBS (irritable bowel syndrome)    Insomnia    Leaky heart valve    Low back pain 08/15/2013   Osteoporosis 03/2017   T score -2.5. 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   Sinusitis acute 02/03/2012   Thrush 07/21/2011   Tricuspid regurgitation, Mild-Moderate 07/21/2011   Moderate by 2 d echocardiogram Echo 11/2021: EF 60-65, no RWMA, normal RVSF, moderately elevated PASP, RVSP 52.1, trivial MR, mild to moderate TR   Unspecified hypothyroidism    Unspecified menopausal and postmenopausal disorder    Uterine prolapse without mention of vaginal wall prolapse    Vitamin B12 deficiency    Vitamin D deficiency 07/17/2016    PAST SURGICAL HISTORY: Past Surgical History:  Procedure Laterality Date   APPENDECTOMY     endoscopic hemoclip     eye surgery Left    lens implant   HYSTEROSCOPY     POLYP   OPEN REDUCTION INTERNAL FIXATION (ORIF) DISTAL RADIAL FRACTURE Left 01/17/2019   Procedure: LEFT OPEN  REDUCTION INTERNAL FIXATION (ORIF) DISTAL RADIUS FRACTURE;  Surgeon: Cindee Salt, MD;  Location: [facility];  Service: Orthopedics;  Laterality: Left;  AXILLARY BLOCK   PERIPHERAL VASCULAR CATHETERIZATION Left 07/24/2016   Procedure: Renal Angiography;  Surgeon: Fransisco Hertz, MD;  Location: [facility] INVASIVE CV LAB;  Service: Cardiovascular;  Laterality: Left;   RIGHT HEART CATH N/A 01/01/2022   Procedure: RIGHT HEART CATH;  Surgeon: Tonny Bollman, MD;  Location: [facility] INVASIVE CV LAB;  Service: Cardiovascular;  Laterality: N/A;   ROBOTIC ASSISTED BILATERAL SALPINGO OOPHERECTOMY  11/15/2020   ROBOTIC ASSISTED LAPAROSCOPIC SACROCOLPOPEXY Bilateral 11/15/2020   Procedure: XI ROBOTIC ASSISTED LAPAROSCOPIC SACROCOLPOPEXY AND SUPRACERVICIAL HYSTERECTOMY BILATERAL SALPINGO-OOPHERECTOMY;  Surgeon: Crist Fat, MD;  Location: [facility];  Service: Urology;  Laterality: Bilateral;  REQUESTING 4.5 HRS   ROBOTIC ASSISTED SUPRACERVICAL HYSTERECTOMY  11/15/2020   UPPER GI ENDOSCOPY     UTERINE FIBROID SURGERY      FAMILY HISTORY: Family History  Problem Relation Age of Onset   Colon cancer Mother    Diabetes Mother  Hypertension Mother    Colon polyps Father    Parkinson's disease Father    Colon cancer Maternal Grandmother    Breast cancer Maternal Grandmother 72   Diabetes Paternal Grandmother    Breast cancer Paternal Grandmother 26   Allergies Sister    Arthritis Brother        b/l hip replace   Alcohol abuse Daughter    Arthritis Son    Hypertension Son    Allergies Son    Osteoporosis Son    Osteoporosis Son    Arthritis Son        back disease, DDD   Allergies Son    Arthritis Son    Irritable bowel syndrome Son        RSD   Hypertension Paternal Grandfather    Heart disease Paternal Grandfather    Aneurysm Paternal Grandfather    Colon cancer Maternal Aunt    Colon cancer Cousin        X3   Ovarian cancer Cousin     SOCIAL HISTORY: Social History    Socioeconomic History   Marital status: Married    Spouse name: Not on file   Number of children: 4   Years of education: 12   Highest education level: 11th grade  Occupational History   Occupation: Retired  Tobacco Use   Smoking status: Former    Current packs/day: 0.00    Types: Cigarettes    Quit date: 08/19/1995    Years since quitting: 28.2   Smokeless tobacco: Never  Vaping Use   Vaping status: Never Used  Substance and Sexual Activity   Alcohol use: Never    Alcohol/week: 0.0 standard drinks of alcohol   Drug use: Never   Sexual activity: Yes    Birth control/protection: Post-menopausal    Comment: 1st intercourse 73 yo-5 partners  Other Topics Concern   Not on file  Social History Narrative   Lives at home with her husband.   Right-handed.   2 cups caffeine per day.   Social Drivers of Corporate investment banker Strain: Low Risk  (07/27/2023)   Overall Financial Resource Strain (CARDIA)    Difficulty of Paying Living Expenses: Not very hard  Food Insecurity: No Food Insecurity (07/27/2023)   Hunger Vital Sign    Worried About Running Out of Food in the Last Year: Never true    Ran Out of Food in the Last Year: Never true  Transportation Needs: No Transportation Needs (07/27/2023)   PRAPARE - Administrator, Civil Service (Medical): No    Lack of Transportation (Non-Medical): No  Physical Activity: Insufficiently Active (07/27/2023)   Exercise Vital Sign    Days of Exercise per Week: 1 day    Minutes of Exercise per Session: 30 min  Stress: No Stress Concern Present (07/27/2023)   [employer] of Occupational Health - Occupational Stress Questionnaire    Feeling of Stress : Only a little  Social Connections: Moderately Isolated (07/27/2023)   Social Connection and Isolation Panel [NHANES]    Frequency of Communication with Friends and Family: Three times a week    Frequency of Social Gatherings with Friends and Family: Once a week    Attends  Religious Services: Never    Database administrator or Organizations: No    Attends Banker Meetings: Patient declined    Marital Status: Married  Catering manager Violence: Not At Risk (01/30/2023)   Humiliation, Afraid, Rape, and Kick questionnaire  Fear of Current or Ex-Partner: No    Emotionally Abused: No    Physically Abused: No    Sexually Abused: No    PHYSICAL EXAM  There were no vitals filed for this visit.   There is no height or weight on file to calculate BMI.    01/01/2023    9:59 AM 07/02/2022    8:33 AM 12/18/2021    8:00 AM  Montreal Cognitive Assessment   Visuospatial/ Executive (0/5) 2 3 3   Naming (0/3) 3 3 3   Attention: Read list of digits (0/2) 2 2 1   Attention: Read list of letters (0/1) 0 1 1  Attention: Serial 7 subtraction starting at 100 (0/3) 0 1 1  Language: Repeat phrase (0/2) 1 2 2   Language : Fluency (0/1) 1 1 1   Abstraction (0/2) 2 2 2   Delayed Recall (0/5) 4 0 1  Orientation (0/6) 6 6 6   Total 21 21 21    Generalized: Well developed, in no acute distress  Via video visit, is alert and oriented, speech is clear and concise, facial symmetry noted, moves about freely.  Looks well via video visit.  DIAGNOSTIC DATA (LABS, IMAGING, TESTING) - I reviewed patient records, labs, notes, testing and imaging myself where available.  Lab Results  Component Value Date   WBC 14.6 (H) 10/16/2023   HGB 13.2 10/16/2023   HCT 39.1 10/16/2023   MCV 92.9 10/16/2023   PLT 284 10/16/2023      Component Value Date/Time   NA 135 10/16/2023 1640   NA 136 03/10/2023 0913   K 3.0 (L) 10/16/2023 1640   CL 95 (L) 10/16/2023 1640   CO2 25 10/16/2023 1640   GLUCOSE 121 (H) 10/16/2023 1640   BUN 5 (L) 10/16/2023 1640   BUN 8 03/10/2023 0913   CREATININE 0.60 10/16/2023 1640   CREATININE 0.74 03/03/2017 1642   CALCIUM 9.5 10/16/2023 1640   CALCIUM 9.0 06/03/2011 0954   PROT 7.1 04/25/2023 0958   PROT 7.1 02/24/2023 1651   ALBUMIN 3.8  04/25/2023 0958   ALBUMIN 4.2 02/24/2023 1651   AST 18 04/25/2023 0958   ALT 15 04/25/2023 0958   ALKPHOS 108 04/25/2023 0958   BILITOT <0.1 (L) 04/25/2023 0958   BILITOT <0.2 02/24/2023 1651   GFRNONAA >60 10/16/2023 1640   GFRNONAA >89 08/01/2013 1646   GFRAA >60 03/30/2018 0953   GFRAA >89 08/01/2013 1646   Lab Results  Component Value Date   CHOL 105 09/11/2022   HDL 51 09/11/2022   LDLCALC 32 09/11/2022   LDLDIRECT 165.0 06/30/2011   TRIG 125 09/11/2022   CHOLHDL 2.1 09/11/2022   Lab Results  Component Value Date   HGBA1C 5.8 04/29/2022   Lab Results  Component Value Date   VITAMINB12 >2000 (H) 12/18/2021   Lab Results  Component Value Date   TSH 0.873 02/24/2023    Margie Ege, AGNP-C, DNP 11/04/2023, 12:58 PM [facility] [address] [place], [place] [address] [phone]

## 2023-11-04 ENCOUNTER — Telehealth (INDEPENDENT_AMBULATORY_CARE_PROVIDER_SITE_OTHER): Payer: Medicare (Managed Care) | Admitting: Neurology

## 2023-11-04 DIAGNOSIS — G44019 Episodic cluster headache, not intractable: Secondary | ICD-10-CM | POA: Diagnosis not present

## 2023-11-04 DIAGNOSIS — R519 Headache, unspecified: Secondary | ICD-10-CM

## 2023-11-04 DIAGNOSIS — H903 Sensorineural hearing loss, bilateral: Secondary | ICD-10-CM | POA: Insufficient documentation

## 2023-11-04 MED ORDER — GABAPENTIN 300 MG PO CAPS: ORAL_CAPSULE | ORAL | 5 refills | Status: AC

## 2023-11-04 MED ORDER — NURTEC 75 MG PO TBDP: 75.0000 mg | ORAL_TABLET | ORAL | 11 refills | Status: DC | PRN

## 2023-11-04 NOTE — Patient Instructions (Signed)
 Increase gabapentin 300 mg a.m., 600 mg PM. Try Nurtec 75 mg tablet at onset of headache. Take 1 tablet at onset of headache, max is 1 tablet in 24 hours.  Follow-up with primary care about referral to neurosurgery, candidate for injection? Keep appointment in May with Dr. Terrace Arabia at our office, call for sooner visit if headache does not improve

## 2023-11-06 DIAGNOSIS — R519 Headache, unspecified: Secondary | ICD-10-CM | POA: Diagnosis not present

## 2023-11-06 DIAGNOSIS — G8929 Other chronic pain: Secondary | ICD-10-CM | POA: Diagnosis not present

## 2023-11-24 ENCOUNTER — Ambulatory Visit: Payer: Medicare (Managed Care) | Admitting: Family Medicine

## 2023-11-26 ENCOUNTER — Other Ambulatory Visit: Payer: Self-pay | Admitting: Family Medicine

## 2023-11-26 DIAGNOSIS — K581 Irritable bowel syndrome with constipation: Secondary | ICD-10-CM

## 2023-11-27 DIAGNOSIS — K921 Melena: Secondary | ICD-10-CM | POA: Diagnosis not present

## 2023-11-27 MED ORDER — PROMETHAZINE HCL 25 MG PO TABS: 25.0000 mg | ORAL_TABLET | Freq: Four times a day (QID) | ORAL | 5 refills | Status: DC | PRN

## 2023-11-27 NOTE — Telephone Encounter (Signed)
 Requesting: promethazine (PHENERGAN) 25 MG tablet  Last Visit: 10/13/2023 Next Visit: Visit date not found Last Refill: 11/02/2023  Please Advise

## 2023-12-01 ENCOUNTER — Telehealth: Payer: Self-pay | Admitting: Family Medicine

## 2023-12-01 NOTE — Telephone Encounter (Signed)
 01/20/2023 same day cancel 11/24/2023 same day cancel  Final warning sent via mychart and mail

## 2023-12-07 ENCOUNTER — Ambulatory Visit: Payer: Medicare (Managed Care) | Admitting: Obstetrics and Gynecology

## 2023-12-07 ENCOUNTER — Encounter: Payer: Self-pay | Admitting: Obstetrics and Gynecology

## 2023-12-07 VITALS — BP 170/95 | HR 80

## 2023-12-07 DIAGNOSIS — R350 Frequency of micturition: Secondary | ICD-10-CM

## 2023-12-07 DIAGNOSIS — M62838 Other muscle spasm: Secondary | ICD-10-CM

## 2023-12-07 DIAGNOSIS — N3941 Urge incontinence: Secondary | ICD-10-CM

## 2023-12-07 DIAGNOSIS — N3281 Overactive bladder: Secondary | ICD-10-CM | POA: Diagnosis not present

## 2023-12-07 LAB — POCT URINALYSIS DIPSTICK
Bilirubin, UA: NEGATIVE
Blood, UA: NEGATIVE
Glucose, UA: NEGATIVE
Ketones, UA: NEGATIVE
Leukocytes, UA: NEGATIVE
Nitrite, UA: NEGATIVE
Protein, UA: NEGATIVE
Spec Grav, UA: 1.01 (ref 1.010–1.025)
Urobilinogen, UA: 0.2 U/dL
pH, UA: 7 (ref 5.0–8.0)

## 2023-12-07 MED ORDER — TROSPIUM CHLORIDE 20 MG PO TABS: 20.0000 mg | ORAL_TABLET | Freq: Two times a day (BID) | ORAL | 5 refills | Status: DC

## 2023-12-07 NOTE — Progress Notes (Signed)
 [facility] Return Visit  SUBJECTIVE  History of Present Illness: Megan [name] is a 73 y.o. female seen in follow-up for pelvic floor pain and OAB. Patient reports she has been having some pelvic floor pain as well as significant OAB. She reports she is up 6 times at night to pee and 6-10 times during the day.     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 difficile diarrhea, CAD (coronary artery disease) (02/24/2023), Cancer (HCC), Chest pain, atypical (07/14/2011), Cold sore (07/17/2016), Colon polyp, Contact dermatitis (03/23/2012), Cystocele, midline, Depression, Diarrhea, Diverticulosis, Endometrial hyperplasia (02/27/2006), GERD (gastroesophageal reflux disease), Headache (04/16/2015), Headache, Headache(784.0) (04/15/2012), Heart murmur, Hematuria (04/27/2012), Hepatic cyst, Hiatal hernia, Hiatal hernia, Hyperlipidemia, mixed (12/17/2014), Hypertension, IBS (irritable bowel syndrome), Insomnia, Leaky heart valve, Low back pain (08/15/2013), Osteoporosis (03/2017), Palpitations (08/29/2011), Pancreatic cyst (04/19/2013), Paronychia of great toe, left (06/19/2013), Preventative health care (09/11/2014), Rectal bleeding (10/10/2011), Rectocele, 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   Past Surgical History: She  has a past surgical history that includes Appendectomy; Uterine fibroid surgery; Hysteroscopy; Cardiac catheterization (Left, 07/24/2016); Upper gi endoscopy; endoscopic hemoclip; eye surgery  (Left); Open reduction internal fixation (orif) distal radial fracture (Left, 01/17/2019); Robotic assisted laparoscopic sacrocolpopexy (Bilateral, 11/15/2020); Robotic assisted bilateral salpingo oophorectomy (11/15/2020); Robotic assisted supracervical hysterectomy (11/15/2020); and RIGHT HEART CATH (N/A, 01/01/2022).   Medications: She has a current medication list which includes the following prescription(s): albuterol , calcium  & magnesium  carbonates, alum hydroxide-mag carbonate, aspirin  ec, biotin, cholecalciferol, cyclobenzaprine , diltiazem , estradiol , repatha  sureclick, famotidine , fluticasone , gabapentin , hydrocortisone , metoprolol  succinate, multivitamin, ondansetron , pantoprazole , promethazine , rabeprazole , sucralfate , trazodone , trospium , and vitamin e.   Allergies: Patient is allergic to sulfamethoxazole, amitriptyline , atorvastatin , cymbalta  [duloxetine  hcl], ezetimibe , lactose intolerance (gi), lexapro  [escitalopram  oxalate], rosuvastatin , topamax  [topiramate ], valsartan , and prednisone .   Social History: Patient  reports that she quit smoking about 28 years ago. Her smoking use included cigarettes. She has never used smokeless tobacco. She reports that she does not drink alcohol and does not use drugs.     OBJECTIVE    Lab Results  Component Value Date   COLORU yellow 12/07/2023   CLARITYU clear 12/07/2023   GLUCOSEUR Negative 12/07/2023   BILIRUBINUR negative 12/07/2023   KETONESU negative 12/07/2023   SPECGRAV 1.010 12/07/2023   RBCUR negative 12/07/2023   PHUR 7.0 12/07/2023   PROTEINUR Negative 12/07/2023   UROBILINOGEN 0.2 12/07/2023   LEUKOCYTESUR Negative 12/07/2023    Physical Exam: Vitals:   12/07/23 1319  BP: (!) 170/95  Pulse: 80   Gen: No apparent distress, A&O x 3.  Detailed Urogynecologic Evaluation:  Pelvic exam normal. No sign of abnormal discharge, bleeding, mass, or mesh erosion.    ASSESSMENT AND PLAN    [name] is a 72 y.o. with:  1.  OAB (overactive bladder)   2. Urinary frequency   3. Urge incontinence   4. Levator spasm    Urine negative for infection today.  We discussed the symptoms of overactive bladder (OAB), which include urinary urgency, urinary frequency, nocturia, with or without urge incontinence.  While we do not know the exact etiology of OAB, several treatment options exist. We discussed management including behavioral therapy (decreasing bladder irritants, urge suppression strategies,  timed voids, bladder retraining), physical therapy, medication; for refractory cases posterior tibial nerve stimulation, sacral neuromodulation, and intravesical botulinum toxin injection. For anticholinergic medications, we discussed the potential side effects of anticholinergics including dry eyes, dry mouth, constipation, cognitive impairment and urinary retention.For Beta-3 agonist medication, we discussed the potential side effect of elevated blood pressure which is more likely to occur in individuals with uncontrolled hypertension. Patient's insurance does not cover Gemtesa and her BP is too elevated for Myrebtriq. Will prescribe Trospium  20mg  x2 daily for her OAB.  Patient's levator spasms are intermittent in nature. She is taking flexeril  PRN and for more intense spasms she does a valium  PRN at nighttime. She does not need a refill today.   Patient to return in 7 weeks to see how she is doing on Trospium  20mg  x2 daily.   Shawnmichael Parenteau G Lota Leamer, NP

## 2023-12-07 NOTE — Patient Instructions (Addendum)
 Start the Trospium  20mg  x2 daily. Take it once in the morning and once at night after your colonoscopy.   Watch for symptoms of dry eyes, dry mouth and constipation

## 2023-12-09 DIAGNOSIS — J4489 Other specified chronic obstructive pulmonary disease: Secondary | ICD-10-CM | POA: Diagnosis not present

## 2023-12-09 DIAGNOSIS — Z79899 Other long term (current) drug therapy: Secondary | ICD-10-CM | POA: Diagnosis not present

## 2023-12-09 DIAGNOSIS — I10 Essential (primary) hypertension: Secondary | ICD-10-CM | POA: Diagnosis not present

## 2023-12-09 DIAGNOSIS — I2510 Atherosclerotic heart disease of native coronary artery without angina pectoris: Secondary | ICD-10-CM | POA: Diagnosis not present

## 2023-12-09 DIAGNOSIS — K2100 Gastro-esophageal reflux disease with esophagitis, without bleeding: Secondary | ICD-10-CM | POA: Diagnosis not present

## 2023-12-09 DIAGNOSIS — K5730 Diverticulosis of large intestine without perforation or abscess without bleeding: Secondary | ICD-10-CM | POA: Diagnosis not present

## 2023-12-09 DIAGNOSIS — K449 Diaphragmatic hernia without obstruction or gangrene: Secondary | ICD-10-CM | POA: Diagnosis not present

## 2023-12-09 DIAGNOSIS — K3189 Other diseases of stomach and duodenum: Secondary | ICD-10-CM | POA: Diagnosis not present

## 2023-12-09 DIAGNOSIS — Z800 Family history of malignant neoplasm of digestive organs: Secondary | ICD-10-CM | POA: Diagnosis not present

## 2023-12-09 DIAGNOSIS — Z87891 Personal history of nicotine dependence: Secondary | ICD-10-CM | POA: Diagnosis not present

## 2023-12-09 DIAGNOSIS — R194 Change in bowel habit: Secondary | ICD-10-CM | POA: Diagnosis not present

## 2023-12-09 DIAGNOSIS — K921 Melena: Secondary | ICD-10-CM | POA: Diagnosis not present

## 2023-12-09 DIAGNOSIS — Z860100 Personal history of colon polyps, unspecified: Secondary | ICD-10-CM | POA: Diagnosis not present

## 2023-12-09 DIAGNOSIS — K222 Esophageal obstruction: Secondary | ICD-10-CM | POA: Diagnosis not present

## 2023-12-10 ENCOUNTER — Other Ambulatory Visit: Payer: Self-pay | Admitting: Family Medicine

## 2023-12-10 DIAGNOSIS — F418 Other specified anxiety disorders: Secondary | ICD-10-CM

## 2023-12-10 NOTE — Telephone Encounter (Signed)
 Patient had stopped taking it back in January and states that she feels as if she needs to take it again.   Please review and advise.  Thanks. Dm/cma

## 2023-12-11 ENCOUNTER — Other Ambulatory Visit: Payer: Medicare (Managed Care)

## 2023-12-14 ENCOUNTER — Ambulatory Visit
Admission: RE | Admit: 2023-12-14 | Discharge: 2023-12-14 | Disposition: A | Discharge status: Home / Self Care | Payer: Medicare (Managed Care) | Source: Ambulatory Visit | Attending: Family Medicine | Admitting: Family Medicine

## 2023-12-14 ENCOUNTER — Other Ambulatory Visit (HOSPITAL_COMMUNITY)
Admission: RE | Admit: 2023-12-14 | Discharge: 2023-12-14 | Disposition: A | Discharge status: Home / Self Care | Payer: Medicare (Managed Care) | Source: Ambulatory Visit | Attending: Student | Admitting: Student

## 2023-12-14 DIAGNOSIS — E042 Nontoxic multinodular goiter: Secondary | ICD-10-CM

## 2023-12-14 DIAGNOSIS — E041 Nontoxic single thyroid nodule: Secondary | ICD-10-CM | POA: Diagnosis not present

## 2023-12-16 LAB — CYTOLOGY - NON PAP

## 2023-12-20 ENCOUNTER — Other Ambulatory Visit: Payer: Self-pay | Admitting: Family Medicine

## 2023-12-20 DIAGNOSIS — F5105 Insomnia due to other mental disorder: Secondary | ICD-10-CM

## 2023-12-28 ENCOUNTER — Encounter: Payer: Self-pay | Admitting: Family Medicine

## 2023-12-29 ENCOUNTER — Other Ambulatory Visit: Payer: Self-pay | Admitting: Family Medicine

## 2023-12-29 DIAGNOSIS — F418 Other specified anxiety disorders: Secondary | ICD-10-CM

## 2023-12-31 ENCOUNTER — Other Ambulatory Visit: Payer: Self-pay | Admitting: Cardiovascular Disease

## 2024-01-06 ENCOUNTER — Encounter: Payer: Self-pay | Admitting: Family Medicine

## 2024-01-06 ENCOUNTER — Ambulatory Visit (INDEPENDENT_AMBULATORY_CARE_PROVIDER_SITE_OTHER): Payer: Medicare (Managed Care) | Admitting: Family Medicine

## 2024-01-06 VITALS — BP 142/76 | HR 77 | Temp 97.5°F | Ht 64.0 in | Wt 154.4 lb

## 2024-01-06 DIAGNOSIS — R221 Localized swelling, mass and lump, neck: Secondary | ICD-10-CM | POA: Diagnosis not present

## 2024-01-06 DIAGNOSIS — J320 Chronic maxillary sinusitis: Secondary | ICD-10-CM

## 2024-01-06 DIAGNOSIS — I10 Essential (primary) hypertension: Secondary | ICD-10-CM | POA: Diagnosis not present

## 2024-01-06 DIAGNOSIS — N644 Mastodynia: Secondary | ICD-10-CM | POA: Diagnosis not present

## 2024-01-06 MED ORDER — LISINOPRIL 5 MG PO TABS: 5.0000 mg | ORAL_TABLET | Freq: Every day | ORAL | 3 refills | Status: AC
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
## 2024-01-06 NOTE — Assessment & Plan Note (Signed)
 BP has been elevated over the past several visits. Continue diltiazem  CD 180 mg daily and metoprolol  succinate 25 mg daily. I will add lisinopril  5 mg daily to her regimen.

## 2024-01-07 ENCOUNTER — Other Ambulatory Visit: Payer: Self-pay | Admitting: Family Medicine

## 2024-01-07 ENCOUNTER — Ambulatory Visit
Admission: RE | Admit: 2024-01-07 | Discharge: 2024-01-07 | Disposition: A | Discharge status: Home / Self Care | Payer: Medicare (Managed Care) | Source: Ambulatory Visit | Attending: Family Medicine | Admitting: Family Medicine

## 2024-01-07 DIAGNOSIS — N644 Mastodynia: Secondary | ICD-10-CM

## 2024-01-07 DIAGNOSIS — R221 Localized swelling, mass and lump, neck: Secondary | ICD-10-CM

## 2024-01-08 ENCOUNTER — Ambulatory Visit: Payer: Self-pay | Admitting: Family Medicine

## 2024-01-14 ENCOUNTER — Encounter: Payer: Self-pay | Admitting: Neurology

## 2024-01-14 ENCOUNTER — Ambulatory Visit: Payer: Medicare (Managed Care) | Admitting: Neurology

## 2024-01-14 VITALS — BP 152/69 | HR 85 | Ht 66.0 in | Wt 153.0 lb

## 2024-01-14 DIAGNOSIS — R519 Headache, unspecified: Secondary | ICD-10-CM

## 2024-01-14 DIAGNOSIS — G3184 Mild cognitive impairment, so stated: Secondary | ICD-10-CM | POA: Diagnosis not present

## 2024-01-14 DIAGNOSIS — G8929 Other chronic pain: Secondary | ICD-10-CM

## 2024-01-14 DIAGNOSIS — M62838 Other muscle spasm: Secondary | ICD-10-CM | POA: Diagnosis not present

## 2024-01-14 MED ORDER — CYCLOBENZAPRINE HCL 5 MG PO TABS: 5.0000 mg | ORAL_TABLET | Freq: Three times a day (TID) | ORAL | 3 refills | Status: DC | PRN

## 2024-01-14 NOTE — Progress Notes (Unsigned)
 ASSESSMENT AND PLAN 73 y.o. year old female   1.  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  Left-sided headaches Significant tenderness along left nuchal line, I performed left greater occipital nerve block and trigger point injection,  also on gabapentin  300 mg 1 in the morning 2 at night Previously tried and failed: Cymbalta  (GI upset), could not tolerate NSAIDs due to GI issues, Suggested warm compression, neck massage, stretching, as needed Flexeril  with Tylenol     DIAGNOSTIC DATA (LABS, IMAGING, TESTING) - I reviewed patient records, labs, notes, testing and imaging myself where available.   HISTORY  Megan [name] is a 73 years old female,  seen in request by her primary care physician PA Farris Hong, [place] and ophthalmologist Dr. Devin Foerster for evaluation of headaches, initial evaluation was on April 07, 2018.   She has past medical history of depression anxiety, had intolerance to multiple different agents in the past, including amitriptyline , Effexor , but she denied previous history of headaches, since early 2019, she began to have frequent left retro-orbital area pressure headaches, initially thought it was due to sinus issues, was evaluated by ENT Dr. Archer Kobs, reported normal CT sinus in 2019.   She had left cataract surgery by Dr. Candi Chafe in April 2019, left vision overall recovered very well, but since her cataract surgery in April, left-sided headache has become worse, constant pressure, 5 out of 10 on a daily basis, often exacerbated to a more severe pounding headache with associated light noise sensitivity, nauseous, she tends to lying in bed for hours to help her headaches, she has tried  over-the-counter ibuprofen  and Tylenol  with limited help,   MRI of the brain in May 2017 for evaluation of acute onset right facial droop and numbness, there was no acute abnormality mild supratentorium small vessel disease,    ESR C-reactive protein were normal in April 2019.   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  UPDATE May 29th 2025: She still complains of left side headache, radiating pain from left neck to left parietal occipital region, triggered by neck movement, it happens at least every other day, she cannot tolerate NSAIDs due to GI issues,  CT of neck soft tissue in June 2024:  1. No specific cause for symptoms. No mass or inflammation seen throughout the neck. 2. 18 mm left thyroid  nodule.   Personally reviewed CT head 1. Stable non contrast CT appearance of chronic small vessel disease since 2021.   CTA of  head and neck in Sept 2024, 2. CTA with no large vessel occlusion, and generally mild for age extracranial atherosclerosis. But Positive for Moderate To Severe Intracranial Atherosclerosis and stenoses affecting the bilateral PCAs (P2 and P3 segments) and also a Median Artery Of The Corpus Callosum.    MRI of the cervical in  October 2023, mild multilevel degenerative changes no significant canal foraminal narrowing  PHYSICAL EXAM  Today's Vitals   01/14/24 0838 01/14/24 0843  BP: (!) 169/85 (!) 152/69  Pulse: 85   Weight: 153 lb (69.4 kg)   Height: 5\' 6"  (1.676 m)    Body mass index is 24.69 kg/m.   Gen: NAD, conversant, well nourised, well groomed                     Cardiovascular: Regular rate rhythm, no peripheral edema, warm, nontender. Eyes: Conjunctivae clear without exudates or hemorrhage Neck: Supple, no carotid bruits. Pulmonary: Clear to auscultation bilaterally   NEUROLOGICAL EXAM:  MENTAL STATUS: Speech/cognition: Awake, alert oriented to history taking and casual conversation  CRANIAL NERVES: CN II: Visual fields are full to confrontation.  Pupils are round equal and briskly reactive to light. CN III, IV, VI: extraocular movement are normal. No ptosis. CN V: Facial sensation is intact to pinprick in all 3 divisions bilaterally. Corneal responses are intact.  CN VII: Face is symmetric with normal eye closure and smile. CN VIII: Hearing is normal to casual conversation CN IX, X: Palate elevates symmetrically. Phonation is normal. CN XI: Head turning and shoulder shrug are intact CN XII: Tongue is midline with normal movements and no atrophy.  MOTOR: There is no pronator drift of out-stretched arms. Muscle bulk and tone are normal. Muscle strength is normal.  REFLEXES: Reflexes are 2+ and symmetric at the biceps, triceps, knees, and ankles. Plantar responses are flexor.  SENSORY: Intact to light touch, pinprick, positional and vibratory sensation are intact in fingers and toes.  COORDINATION: Rapid alternating movements and fine finger movements are intact. There is no dysmetria on finger-to-nose and heel-knee-shin.    GAIT/STANCE: Posture is normal. Gait is steady   REVIEW OF SYSTEMS: Out of a complete 14 system review of symptoms, the patient complains only of the following  symptoms, and all other reviewed systems are negative.  See HPI  ALLERGIES: Allergies  Allergen Reactions   Sulfamethoxazole Shortness Of Breath    chest tightness   Amitriptyline  Anxiety and Other (See Comments)    Depression, insomnia   Atorvastatin      10-80 mg daily - headache, joint pain, muscle pain, GI symptoms   Cymbalta  [Duloxetine  Hcl] Other (See Comments)    Insomnia, constipation   Ezetimibe      unknown   Lactose Intolerance (Gi) Diarrhea and Nausea And Vomiting   Lexapro  [Escitalopram  Oxalate]     nausea   Rosuvastatin      10 mg daily - headache, joint pain, muscle pain, GI symptoms   Topamax  [Topiramate ] Itching   Valsartan      Nausea and dizziness   Prednisone  Palpitations    HOME MEDICATIONS: Outpatient Medications Prior to Visit  Medication Sig Dispense Refill   albuterol  (VENTOLIN  HFA) 108 (90 Base) MCG/ACT inhaler TAKE 2 PUFFS BY MOUTH EVERY 6 HOURS AS NEEDED FOR WHEEZE OR SHORTNESS OF BREATH 18 each 5   ALPRAZolam  (XANAX ) 1 MG tablet TAKE 1 TABLET BY MOUTH 2 TIMES DAILY AS NEEDED. 45 tablet 1   Alum & Mag Hydroxide-Simeth (MYLANTA PO) Take by mouth. PRN  Alum Hydroxide-Mag Carbonate (GAVISCON PO) Take by mouth. PRN     aspirin  EC 81 MG tablet Take 81 mg by mouth daily. Swallow whole.     Biotin 10 MG TABS Take by mouth.     cholecalciferol (VITAMIN D3) 25 MCG (1000 UNIT) tablet Take 5,000 Units by mouth daily.     cyclobenzaprine  (FLEXERIL ) 5 MG tablet Take 1 tablet (5 mg total) by mouth 3 (three) times daily as needed for muscle spasms. 60 tablet 1   diltiazem  (CARDIZEM  CD) 180 MG 24 hr capsule Take 180 mg by mouth daily.     estradiol  (ESTRACE ) 0.1 MG/GM vaginal cream PLACE 0.5 G VAGINALLY 2 (TWO) TIMES A WEEK. PLACE 0.5G NIGHTLY FOR TWO WEEKS THEN TWICE A WEEK AFTER 42.5 g 11   Evolocumab  (REPATHA  SURECLICK) 140 MG/ML SOAJ Inject 140 mg into the skin every 14 (fourteen) days. 2 mL 11   famotidine  (PEPCID ) 40 MG tablet Take 40 mg by mouth daily.      FLUoxetine  (PROZAC ) 10 MG capsule TAKE 1 CAPSULE BY MOUTH EVERY DAY 90 capsule 0   fluticasone  (FLONASE ) 50 MCG/ACT nasal spray SPRAY 2 SPRAYS INTO EACH NOSTRIL EVERY DAY 48 mL 6   gabapentin  (NEURONTIN ) 300 MG capsule Take 1 in the morning, take 2 at night 90 capsule 5   hydrocortisone  (ANUSOL -HC) 2.5 % rectal cream Place 1 Application rectally 2 (two) times daily. 30 g 0   lisinopril  (ZESTRIL ) 5 MG tablet Take 1 tablet (5 mg total) by mouth daily. 90 tablet 3   metoprolol  succinate (TOPROL -XL) 25 MG 24 hr tablet TAKE 1 TABLET (25 MG TOTAL) BY MOUTH DAILY. 90 tablet 2   Multiple Vitamin (MULTIVITAMIN) tablet Take 1 tablet by mouth daily.     ondansetron  (ZOFRAN ) 4 MG tablet TAKE 1 TABLET BY MOUTH EVERY 8 HOURS AS NEEDED FOR NAUSEA AND VOMITING 20 tablet 3   pantoprazole  (PROTONIX ) 40 MG tablet Take 1 tablet (40 mg total) by mouth daily. 30 tablet 3   promethazine  (PHENERGAN ) 25 MG tablet Take 1 tablet (25 mg total) by mouth every 6 (six) hours as needed. 30 tablet 5   sucralfate  (CARAFATE ) 1 g tablet Take 1 g by mouth 4 (four) times daily.     traZODone  (DESYREL ) 50 MG tablet TAKE 1/2 TO 1 TABLET BY MOUTH AT BEDTIME AS NEEDED FOR SLEEP 90 tablet 3   Vitamin E (VITAMIN E/D-ALPHA NATURAL) 268 MG (400 UNIT) CAPS Take by mouth.     amoxicillin  (AMOXIL ) 500 MG capsule Take 500 mg by mouth every 8 (eight) hours.     clindamycin  (CLEOCIN ) 300 MG capsule Take 300 mg by mouth every 12 (twelve) hours.     RABEprazole  (ACIPHEX ) 20 MG tablet Take 20 mg by mouth daily.     trospium  (SANCTURA ) 20 MG tablet Take 1 tablet (20 mg total) by mouth 2 (two) times daily. 60 tablet 5   No facility-administered medications prior to visit.    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 difficile  diarrhea    CAD (coronary artery disease) 02/24/2023   Nonobstructive CAD >> CCTA 12/26/2021: CAC score 115 (72nd percentile); RCA mid and distal <25, LAD proximal and mid 25-49, mid-distal 50-69, LCx proximal 50-69,  ascending aorta 33 mm; FFR normal-medical therapy   Cancer (HCC)    pt denies    Chest pain, atypical 07/14/2011   Cold sore 07/17/2016   Colon polyp    Contact dermatitis 03/23/2012   Cystocele, midline    Depression    Diarrhea    Diverticulosis    Endometrial hyperplasia 02/27/2006   BENIGN ENDO BX ON 02/2007   GERD (gastroesophageal reflux disease)    Headache 04/16/2015   Headache    Headache(784.0) 04/15/2012   pt denies    Heart murmur    Hematuria 04/27/2012   Hepatic cyst    Hiatal hernia    Hiatal hernia    Hyperlipidemia, mixed 12/17/2014   Hypertension    IBS (irritable bowel syndrome)    Insomnia    Leaky heart valve    Low back pain 08/15/2013   Osteoporosis 03/2017   T score -2.5. 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   Sinusitis acute 02/03/2012   Thrush 07/21/2011   Tricuspid regurgitation, Mild-Moderate 07/21/2011   Moderate by 2 d echocardiogram Echo 11/2021: EF 60-65, no RWMA, normal RVSF, moderately elevated PASP, RVSP 52.1, trivial MR, mild to moderate TR   Unspecified hypothyroidism    Unspecified menopausal and postmenopausal disorder    Uterine prolapse without mention of vaginal wall prolapse    Vitamin B12 deficiency    Vitamin D  deficiency 07/17/2016    PAST SURGICAL HISTORY: Past Surgical History:  Procedure Laterality Date   APPENDECTOMY     endoscopic hemoclip     eye surgery Left    lens implant    HYSTEROSCOPY     POLYP   OPEN REDUCTION INTERNAL FIXATION (ORIF) DISTAL RADIAL FRACTURE Left 01/17/2019   Procedure: LEFT OPEN REDUCTION INTERNAL FIXATION (ORIF) DISTAL RADIUS FRACTURE;  Surgeon: Lyanne Sample, MD;  Location:  SURGERY CENTER;  Service: Orthopedics;  Laterality: Left;  AXILLARY BLOCK   PERIPHERAL VASCULAR CATHETERIZATION Left 07/24/2016   Procedure: Renal Angiography;  Surgeon: Arvil Lauber, MD;  Location: [facility] INVASIVE CV LAB;  Service: Cardiovascular;  Laterality: Left;   RIGHT HEART CATH N/A 01/01/2022   Procedure: RIGHT HEART CATH;  Surgeon: Arnoldo Lapping, MD;  Location: [facility] INVASIVE CV LAB;  Service: Cardiovascular;  Laterality: N/A;   ROBOTIC ASSISTED BILATERAL SALPINGO OOPHERECTOMY  11/15/2020   ROBOTIC ASSISTED LAPAROSCOPIC SACROCOLPOPEXY Bilateral 11/15/2020   Procedure: XI ROBOTIC ASSISTED LAPAROSCOPIC SACROCOLPOPEXY AND SUPRACERVICIAL HYSTERECTOMY BILATERAL SALPINGO-OOPHERECTOMY;  Surgeon: Andrez Banker, MD;  Location: [facility];  Service: Urology;  Laterality: Bilateral;  REQUESTING 4.5 HRS   ROBOTIC ASSISTED SUPRACERVICAL HYSTERECTOMY  11/15/2020   UPPER GI ENDOSCOPY     UTERINE FIBROID SURGERY      FAMILY HISTORY: Family History  Problem Relation Age of Onset   Colon cancer Mother    Diabetes Mother    Hypertension Mother    Colon polyps Father    Parkinson's disease Father    Colon cancer Maternal Grandmother    Breast cancer Maternal Grandmother 28   Diabetes Paternal Grandmother    Breast cancer Paternal Grandmother 80   Allergies Sister    Arthritis Brother        b/l  hip replace   Alcohol abuse Daughter    Arthritis Son    Hypertension Son    Allergies Son    Osteoporosis Son    Osteoporosis Son    Arthritis Son        back disease, DDD   Allergies Son    Arthritis Son    Irritable bowel syndrome Son        RSD   Hypertension Paternal Grandfather    Heart disease Paternal Grandfather    Aneurysm Paternal Grandfather    Colon cancer  Maternal Aunt    Colon cancer Cousin        X3   Ovarian cancer Cousin     SOCIAL HISTORY: Social History   Socioeconomic History   Marital status: Married    Spouse name: Not on file   Number of children: 4   Years of education: 12   Highest education level: 11th grade  Occupational History   Occupation: Retired  Tobacco Use   Smoking status: Former    Current packs/day: 0.00    Types: Cigarettes    Quit date: 08/19/1995    Years since quitting: 28.4   Smokeless tobacco: Never  Vaping Use   Vaping status: Never Used  Substance and Sexual Activity   Alcohol use: Never    Alcohol/week: 0.0 standard drinks of alcohol   Drug use: Never   Sexual activity: Yes    Birth control/protection: Post-menopausal    Comment: 1st intercourse 34 yo-5 partners  Other Topics Concern   Not on file  Social History Narrative   Lives at home with her husband.   Right-handed.   2 cups caffeine  per day.   Social Drivers of Corporate investment banker Strain: Low Risk  (07/27/2023)   Overall Financial Resource Strain (CARDIA)    Difficulty of Paying Living Expenses: Not very hard  Food Insecurity: No Food Insecurity (07/27/2023)   Hunger Vital Sign    Worried About Running Out of Food in the Last Year: Never true    Ran Out of Food in the Last Year: Never true  Transportation Needs: No Transportation Needs (07/27/2023)   PRAPARE - Administrator, Civil Service (Medical): No    Lack of Transportation (Non-Medical): No  Physical Activity: Insufficiently Active (07/27/2023)   Exercise Vital Sign    Days of Exercise per Week: 1 day    Minutes of Exercise per Session: 30 min  Stress: No Stress Concern Present (07/27/2023)   [employer] of Occupational Health - Occupational Stress Questionnaire    Feeling of Stress : Only a little  Social Connections: Moderately Isolated (07/27/2023)   Social Connection and Isolation Panel [NHANES]    Frequency of Communication with Friends  and Family: Three times a week    Frequency of Social Gatherings with Friends and Family: Once a week    Attends Religious Services: Never    Database administrator or Organizations: No    Attends Banker Meetings: Patient declined    Marital Status: Married  Catering manager Violence: Not At Risk (01/30/2023)   Humiliation, Afraid, Rape, and Kick questionnaire    Fear of Current or Ex-Partner: No    Emotionally Abused: No    Physically Abused: No    Sexually Abused: No    Phebe Brasil. M.D. Ph.D.

## 2024-01-14 NOTE — Progress Notes (Unsigned)
 Procedure: Left Occipital Nerve injection/Trigger point injection   Location: bilateral occiput   The risks, benefits and anticipated outcomes of the procedure, the risks and benefits of the alternatives to the procedure, and the roles and tasks of the personnel to be involved, were discussed with the patient, and the patient consents to the procedure and agrees to proceed.     A combination of 1.5 cc betamethasone  6 mg and 1.5 cc of 0.5% bupivacaine  were prepared.    The left  greater occipital nerve was injected 3cm caudal and 1.5 cm lateral to the inion where the main trunk of the occipital nerve penetrates the semispinalis muscle.  The needle was placed perpendicular and the needle advanced 1.5 cm. After aspiration to ensure no obstruction or presence of blood, the area was injected.   The needle was repositioned in a fan-like manner and the entire area was injected, also left levator scapular

## 2024-01-14 NOTE — Progress Notes (Unsigned)
 1.5 ml betamethasone  (30mg /35ml) mixed with 1.5 ml  0.5% Bupivicaine (250/50ml) for total 3 ml for trigger point injection  Bupivicaine 0.5% NDC 55150-250-50 Lot [id number] Ex 09/2025  Betamethasone  [id number] Lot [id number] Ex 09/17/2024

## 2024-01-15 MED ORDER — BUPIVACAINE HCL (PF) 0.5 % IJ SOLN: 1.5000 mL | Freq: Once | INTRAMUSCULAR | Status: DC

## 2024-01-15 MED ORDER — BETAMETHASONE SOD PHOS & ACET 6 (3-3) MG/ML IJ SUSP: 9.0000 mg | Freq: Once | INTRAMUSCULAR | Status: DC

## 2024-01-18 ENCOUNTER — Telehealth: Payer: Self-pay

## 2024-01-18 ENCOUNTER — Other Ambulatory Visit (HOSPITAL_COMMUNITY): Payer: Self-pay

## 2024-01-18 NOTE — Telephone Encounter (Signed)
 Pharmacy Patient Advocate Encounter   Received notification from CoverMyMeds that prior authorization for Cyclobenzaprine  HCl 5MG  tablets is required/requested.   Insurance verification completed.   The patient is insured through [employer] .   Per test claim: PA required; PA submitted to above mentioned insurance via CoverMyMeds Key/confirmation #/EOC [id number] Status is pending

## 2024-01-19 ENCOUNTER — Ambulatory Visit
Admission: RE | Admit: 2024-01-19 | Discharge: 2024-01-19 | Disposition: A | Discharge status: Home / Self Care | Payer: Medicare (Managed Care) | Source: Ambulatory Visit | Attending: Family Medicine | Admitting: Family Medicine

## 2024-01-19 ENCOUNTER — Ambulatory Visit: Payer: Self-pay | Admitting: Family Medicine

## 2024-01-19 DIAGNOSIS — N644 Mastodynia: Secondary | ICD-10-CM

## 2024-01-19 NOTE — Telephone Encounter (Signed)
 Pharmacy Patient Advocate Encounter  Received notification from [employer] that Prior Authorization for Cyclobenzaprine  HCl 5MG  tablets has been DENIED.  Full denial letter will be uploaded to the media tab. See denial reason below.   PA #/Case ID/Reference #: PA Case ID #: [id number]

## 2024-01-20 ENCOUNTER — Other Ambulatory Visit (HOSPITAL_COMMUNITY): Payer: Self-pay

## 2024-01-20 ENCOUNTER — Telehealth: Payer: Self-pay

## 2024-01-20 MED ORDER — TIZANIDINE HCL 4 MG PO TABS: 4.0000 mg | ORAL_TABLET | Freq: Two times a day (BID) | ORAL | 3 refills | Status: DC | PRN

## 2024-01-20 NOTE — Telephone Encounter (Signed)
 Pharmacy Patient Advocate Encounter   Received notification from CoverMyMeds that prior authorization for Promethazine  HCl 25MG  tablets  is required/requested.   Insurance verification completed.   The patient is insured through [employer] .   Per test claim: PA required; PA submitted to above mentioned insurance via CoverMyMeds Key/confirmation #/EOC (Key: [id number])   Status is pending

## 2024-01-20 NOTE — Telephone Encounter (Signed)
 Meds ordered this encounter  Medications   tiZANidine  (ZANAFLEX ) 4 MG tablet    Sig: Take 1 tablet (4 mg total) by mouth 2 (two) times daily as needed for muscle spasms.    Dispense:  30 tablet    Refill:  3

## 2024-01-22 ENCOUNTER — Telehealth: Payer: Self-pay | Admitting: Cardiovascular Disease

## 2024-01-22 MED ORDER — DILTIAZEM HCL ER COATED BEADS 180 MG PO CP24: 180.0000 mg | ORAL_CAPSULE | Freq: Every day | ORAL | 2 refills | Status: DC

## 2024-01-22 NOTE — Telephone Encounter (Signed)
*  STAT* If patient is at the pharmacy, call can be transferred to refill team.   1. Which medications need to be refilled? (please list name of each medication and dose if known) diltiazem  (CARDIZEM  CD) 180 MG 24 hr capsule    2. Would you like to learn more about the convenience, safety, & potential cost savings by using the [facility]?    3. Are you open to using the [facility] (Type [facility].  ).   4. Which pharmacy/location (including street and city if local pharmacy) is medication to be sent to?  [id number] - [place], [place] - [address] AT [facility] OF [address]     5. Do they need a 30 day or 90 day supply? 90 day

## 2024-01-22 NOTE — Telephone Encounter (Signed)
 RX sent to requested Pharmacy

## 2024-01-25 ENCOUNTER — Ambulatory Visit: Payer: Medicare (Managed Care) | Admitting: Obstetrics and Gynecology

## 2024-01-25 VITALS — BP 130/74 | HR 76

## 2024-01-25 DIAGNOSIS — R350 Frequency of micturition: Secondary | ICD-10-CM | POA: Diagnosis not present

## 2024-01-25 DIAGNOSIS — M62838 Other muscle spasm: Secondary | ICD-10-CM | POA: Diagnosis not present

## 2024-01-25 DIAGNOSIS — R102 Pelvic and perineal pain: Secondary | ICD-10-CM

## 2024-01-25 LAB — POCT URINALYSIS DIP (CLINITEK)
Bilirubin, UA: NEGATIVE
Blood, UA: NEGATIVE
Glucose, UA: NEGATIVE mg/dL
Ketones, POC UA: NEGATIVE mg/dL
Leukocytes, UA: NEGATIVE
Nitrite, UA: NEGATIVE
POC PROTEIN,UA: NEGATIVE
Spec Grav, UA: 1.01 (ref 1.010–1.025)
Urobilinogen, UA: 0.2 U/dL
pH, UA: 6.5 (ref 5.0–8.0)

## 2024-01-25 NOTE — Patient Instructions (Addendum)
 [facility] [address] [place], [place] [address] [phone] [email or web address]  Other Proofreader Services: [facility]- [address] [place], [place], [place], [address], [phone], Ext. [phone], 2nd and 4th Thursday of the month at 6:30am.  10 clients each day by appointment, can sometimes see walk-in patients if someone does not show for an appointment. [facility]- [address] [place], [place], [address], [phone] [facility] [address] [place], [place], [address], [phone] [facility]- [phone] [facility] Department- [phone] [facility] [phone]   You can continue the muscle relaxer's at nighttime.

## 2024-01-25 NOTE — Progress Notes (Signed)
 [facility] Return Visit  SUBJECTIVE  History of Present Illness: Megan [name] is a 73 y.o. female seen in follow-up for pelvic floor dysfunction and OAB. Plan at last visit was to start Trospium  and continue on her muscle relaxant regimen.   Patient is concerned as she has significant "tooth infection" in her left upper teeth. She reports this is causing her pain and difficulty sleeping.   Patient reports she did not take the Trospium  long as she felt she was having trouble emptying completely.   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 difficile diarrhea, CAD (coronary artery disease) (02/24/2023), Cancer (HCC), Chest pain, atypical (07/14/2011), Cold sore (07/17/2016), Colon polyp, Contact dermatitis (03/23/2012), Cystocele, midline, Depression, Diarrhea, Diverticulosis, Endometrial hyperplasia (02/27/2006), GERD (gastroesophageal reflux disease), Headache (04/16/2015), Headache, Headache(784.0) (04/15/2012), Heart murmur, Hematuria (04/27/2012), Hepatic cyst, Hiatal hernia, Hiatal hernia, Hyperlipidemia, mixed (12/17/2014), Hypertension, IBS (irritable bowel syndrome), Insomnia, Leaky heart valve, Low back pain (08/15/2013), Osteoporosis (03/2017), Palpitations (08/29/2011), Pancreatic cyst (04/19/2013), Paronychia of great toe, left (06/19/2013), Preventative health care (09/11/2014), Rectal bleeding (10/10/2011), Rectocele, 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   Past Surgical History: She  has a  past surgical history that includes Appendectomy; Uterine fibroid surgery; Hysteroscopy; Cardiac catheterization (Left, 07/24/2016); Upper gi endoscopy; endoscopic hemoclip; eye surgery (Left); Open reduction internal fixation (orif) distal radial fracture (Left, 01/17/2019); Robotic assisted laparoscopic sacrocolpopexy (Bilateral, 11/15/2020); Robotic assisted bilateral salpingo oophorectomy (11/15/2020); Robotic assisted supracervical hysterectomy (11/15/2020); and RIGHT HEART CATH (N/A, 01/01/2022).   Medications: She has a current medication list which includes the following prescription(s): albuterol , alprazolam , calcium  & magnesium  carbonates, alum hydroxide-mag carbonate, aspirin  ec, biotin, cholecalciferol, diltiazem , estradiol , repatha  sureclick, famotidine , fluoxetine , fluticasone , gabapentin , hydrocortisone , lisinopril , metoprolol  succinate, multivitamin, ondansetron , pantoprazole , promethazine , sucralfate , tizanidine , trazodone , and vitamin e, and the following Facility-Administered Medications: betamethasone  acetate-betamethasone  sodium phosphate  and bupivacaine (pf).   Allergies: Patient is allergic to sulfamethoxazole, amitriptyline , atorvastatin , cymbalta  [duloxetine  hcl], ezetimibe , lactose intolerance (gi), lexapro  [escitalopram  oxalate], rosuvastatin , topamax  [topiramate ], valsartan , and prednisone .   Social History: Patient  reports that she quit smoking about 28 years ago. Her smoking use included cigarettes. She has never used smokeless tobacco. She reports that she does not drink alcohol and does not use drugs.     OBJECTIVE    Lab Results  Component Value Date   COLORU yellow 01/25/2024   CLARITYU clear 01/25/2024   GLUCOSEUR negative 01/25/2024   BILIRUBINUR negative 01/25/2024   KETONESU negative 12/07/2023   SPECGRAV 1.010 01/25/2024   RBCUR negative 01/25/2024   PHUR 6.5 01/25/2024   PROTEINUR Negative 12/07/2023   UROBILINOGEN 0.2 01/25/2024   LEUKOCYTESUR Negative  01/25/2024     Physical Exam: Vitals:   01/25/24 1325  BP: 130/74  Pulse: 76   Gen: No apparent distress, A&O x 3.  Detailed Urogynecologic Evaluation:  No mesh erosion and vaginal tract appears well supported. No sign of abnormal discharge or other concerning symptoms. Mesh sling feels to be in appropriate space.    ASSESSMENT AND PLAN    [name] is a 73 y.o. with:  1. Urinary frequency   2. Levator spasm   3. Pelvic pain    Patient's urine did not show any signs  of infection today. Patient's pelvic exam was not concerning for sling abnormalities.  Patient to continue her PRN muscle relaxants and on significant pain nights can use the vaginal valium , she is using this sparingly.   Encouraged patient to try and get her tooth infections handled as it can become a much more serious problem if left without treatment. She reports she is on antibiotics for her infections but has obvious swelling to the left cheek and up to the orbital socket. Patient to return in 6 months for pelvic floor check or sooner if needed.       Megan [name] Megan Merlin, NP

## 2024-01-26 ENCOUNTER — Encounter: Payer: Self-pay | Admitting: Obstetrics and Gynecology

## 2024-01-26 ENCOUNTER — Other Ambulatory Visit: Payer: Self-pay

## 2024-01-26 MED ORDER — ESTRADIOL 0.1 MG/GM VA CREA: 0.5000 g | TOPICAL_CREAM | VAGINAL | 11 refills | Status: DC

## 2024-01-27 ENCOUNTER — Encounter: Payer: Self-pay | Admitting: Obstetrics and Gynecology

## 2024-01-27 ENCOUNTER — Ambulatory Visit: Payer: Self-pay | Admitting: Obstetrics

## 2024-01-27 ENCOUNTER — Ambulatory Visit (INDEPENDENT_AMBULATORY_CARE_PROVIDER_SITE_OTHER): Payer: Medicare (Managed Care)

## 2024-01-27 ENCOUNTER — Other Ambulatory Visit: Payer: Self-pay | Admitting: Obstetrics and Gynecology

## 2024-01-27 ENCOUNTER — Other Ambulatory Visit (HOSPITAL_COMMUNITY)
Admission: RE | Admit: 2024-01-27 | Discharge: 2024-01-27 | Disposition: A | Discharge status: Home / Self Care | Payer: Medicare (Managed Care) | Source: Other Acute Inpatient Hospital | Attending: Obstetrics and Gynecology | Admitting: Obstetrics and Gynecology

## 2024-01-27 VITALS — BP 159/66 | HR 72 | Temp 97.8°F

## 2024-01-27 DIAGNOSIS — R8290 Unspecified abnormal findings in urine: Secondary | ICD-10-CM | POA: Diagnosis not present

## 2024-01-27 DIAGNOSIS — R300 Dysuria: Secondary | ICD-10-CM | POA: Diagnosis not present

## 2024-01-27 DIAGNOSIS — N952 Postmenopausal atrophic vaginitis: Secondary | ICD-10-CM

## 2024-01-27 DIAGNOSIS — R82998 Other abnormal findings in urine: Secondary | ICD-10-CM | POA: Insufficient documentation

## 2024-01-27 DIAGNOSIS — R319 Hematuria, unspecified: Secondary | ICD-10-CM

## 2024-01-27 DIAGNOSIS — R350 Frequency of micturition: Secondary | ICD-10-CM

## 2024-01-27 LAB — POCT URINALYSIS DIPSTICK
Bilirubin, UA: NEGATIVE
Glucose, UA: NEGATIVE
Ketones, UA: NEGATIVE
Nitrite, UA: POSITIVE
Protein, UA: NEGATIVE
Spec Grav, UA: 1.02 (ref 1.010–1.025)
Urobilinogen, UA: 0.2 U/dL
pH, UA: 6 (ref 5.0–8.0)

## 2024-01-27 LAB — URINALYSIS, ROUTINE W REFLEX MICROSCOPIC
Bilirubin Urine: NEGATIVE
Glucose, UA: NEGATIVE mg/dL
Ketones, ur: NEGATIVE mg/dL
Nitrite: NEGATIVE
Protein, ur: NEGATIVE mg/dL
Specific Gravity, Urine: 1.004 — ABNORMAL LOW (ref 1.005–1.030)
WBC, UA: >50 WBC/hpf (ref 0–5)
pH: 8 (ref 5.0–8.0)

## 2024-01-27 MED ORDER — ESTRADIOL 0.1 MG/GM VA CREA: 0.5000 g | TOPICAL_CREAM | VAGINAL | 11 refills | Status: AC

## 2024-01-27 MED ORDER — NITROFURANTOIN MONOHYD MACRO 100 MG PO CAPS
100.0000 mg | ORAL_CAPSULE | Freq: Two times a day (BID) | ORAL | 0 refills | Status: AC
Start: 2024-01-27 — End: 2024-02-01

## 2024-01-27 NOTE — Progress Notes (Signed)
 Megan [name] arrived today with cloudy urine, dysuria, flank pain on the right, nausea, urinary incontinence, and urinary urgency. Patient is notexperiencing fever, unstable vitals and/or one-sided back flank pain. Patient has not had had a recent hospitalization due to UTI.  Last visit in the office was 01/25/2024.  Per protocol:   The most recent Urinalysis completed on 01-25-2024 and wasnormal.  Last Creatinine level  Lab Results  Component Value Date   CREATININE 0.60 10/16/2023    An urine specimen was collected and POCT urinalysis completed. [] A cath specimen was collected due to patient's current condition, symptoms or post-procedural state.  Total urine output by catheter is  Output by Drain (mL) 01/25/24 0701 - 01/25/24 1900 01/25/24 1901 - 01/26/24 0700 01/26/24 0701 - 01/26/24 1900 01/26/24 1901 - 01/27/24 0700 01/27/24 0701 - 01/27/24 1046  Patient has no LDAs of requested type attached.    Aaron Aas    POCT Urine results is not normal.  Urine micro sent per protocol for abnormal urinalysis.  Urine culture was sent per protocol for abnormal urinalysis.     [x] Pt was notified of positive urine results and plan for additional urine testing. We will contact you within the next 3-4 days with these results.  [] No Prescription was sent to your pharmacy.  The additional testing will indicate if a prescription is needed.   [] Patient was notified of abnormal urine results. The following prescription is sent to your preferred pharmacy.  []  Macrobid  100mg  #10 1 tablet by mouth twice daily with food for 5 days      []  Bactrim DS 800-160mg  #6 1 tablet by mouth twice daily for 3 days        []  Due to your current medication allergies, an alternate prescription was discussed with your provider and will be prescribed and sent to your pharmacy.  [] You can take over the counter AZO two tablets up to three times a day for two days.  Take AZO tablets with a full glass of water . AZO will turn your urine orange,  this is normal.   [] The patient was notified of negative urine results.  If symptoms persist, you may take over the counter AZO two tablets up to three times a day for two days.  AZO will turn your urine orange, this is normal.  Contact the office back to schedule an appointment if your symptoms persist or worsen or you develop additional symptoms.       CC'd note to patient's provider.

## 2024-01-28 LAB — URINE CULTURE: Culture: <10000 — AB

## 2024-02-01 ENCOUNTER — Other Ambulatory Visit (HOSPITAL_COMMUNITY): Payer: Self-pay

## 2024-02-01 NOTE — Telephone Encounter (Signed)
 Pharmacy Patient Advocate Encounter  Received notification from [employer] that Prior Authorization for [id number] has been APPROVED from 5.5.25 to 6.4.26. Ran test claim, Copay is $1.63. This test claim was processed through [facility]- copay amounts may vary at other pharmacies due to pharmacy/plan contracts, or as the patient moves through the different stages of their insurance plan.   PA #/Case ID/Reference #:  (Key: [id number])

## 2024-02-03 ENCOUNTER — Ambulatory Visit (INDEPENDENT_AMBULATORY_CARE_PROVIDER_SITE_OTHER): Payer: Medicare (Managed Care) | Admitting: Family Medicine

## 2024-02-03 ENCOUNTER — Ambulatory Visit: Payer: Self-pay | Admitting: Family Medicine

## 2024-02-03 ENCOUNTER — Encounter: Payer: Self-pay | Admitting: Family Medicine

## 2024-02-03 VITALS — BP 130/70 | HR 82 | Temp 97.1°F | Ht 66.0 in | Wt 151.4 lb

## 2024-02-03 DIAGNOSIS — I10 Essential (primary) hypertension: Secondary | ICD-10-CM

## 2024-02-03 DIAGNOSIS — K047 Periapical abscess without sinus: Secondary | ICD-10-CM | POA: Diagnosis not present

## 2024-02-03 DIAGNOSIS — R350 Frequency of micturition: Secondary | ICD-10-CM

## 2024-02-03 LAB — CBC
HCT: 41.6 % (ref 36.0–46.0)
Hemoglobin: 13.9 g/dL (ref 12.0–15.0)
MCHC: 33.4 g/dL (ref 30.0–36.0)
MCV: 91.8 fl (ref 78.0–100.0)
Platelets: 325 10*3/uL (ref 150.0–400.0)
RBC: 4.54 Mil/uL (ref 3.87–5.11)
RDW: 13.4 % (ref 11.5–15.5)
WBC: 7.1 10*3/uL (ref 4.0–10.5)

## 2024-02-03 NOTE — Assessment & Plan Note (Signed)
 Appears to be healing. As she had some initial relief of symptoms, recommend giving this more time to resolve. Could consider taking antibiotics provided by dental.
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
## 2024-02-03 NOTE — Assessment & Plan Note (Signed)
 BP at goal today. Continue diltiazem  CD 180 mg daily, metoprolol  succinate 25 mg daily, and lisinopril  5 mg daily.

## 2024-02-04 ENCOUNTER — Encounter: Payer: Self-pay | Admitting: Family Medicine

## 2024-02-05 LAB — URINALYSIS W MICROSCOPIC + REFLEX CULTURE
Bilirubin Urine: NEGATIVE
Glucose, UA: NEGATIVE
Hgb urine dipstick: NEGATIVE
Hyaline Cast: NONE SEEN /LPF
Ketones, ur: NEGATIVE
Nitrites, Initial: NEGATIVE
Protein, ur: NEGATIVE
RBC / HPF: NONE SEEN /HPF (ref 0–2)
Specific Gravity, Urine: 1.007 (ref 1.001–1.035)
pH: 7.5 (ref 5.0–8.0)

## 2024-02-05 LAB — CULTURE INDICATED

## 2024-02-05 LAB — URINE CULTURE
MICRO NUMBER:: 16599338
Result:: NO GROWTH
SPECIMEN QUALITY:: ADEQUATE

## 2024-02-12 ENCOUNTER — Ambulatory Visit: Payer: Medicare (Managed Care)

## 2024-02-17 ENCOUNTER — Encounter: Payer: Self-pay | Admitting: Family Medicine

## 2024-02-17 DIAGNOSIS — F418 Other specified anxiety disorders: Secondary | ICD-10-CM

## 2024-02-18 ENCOUNTER — Other Ambulatory Visit: Payer: Self-pay | Admitting: Family Medicine

## 2024-02-18 DIAGNOSIS — F418 Other specified anxiety disorders: Secondary | ICD-10-CM

## 2024-02-18 MED ORDER — ALPRAZOLAM 1 MG PO TABS: 1.0000 mg | ORAL_TABLET | Freq: Two times a day (BID) | ORAL | 1 refills | Status: DC | PRN

## 2024-02-18 NOTE — Telephone Encounter (Signed)
 12/29/23 45 tabs/1 RF

## 2024-02-18 NOTE — Telephone Encounter (Signed)
 Copied from CRM [id number]. Topic: Clinical - Medication Refill >> Feb 18, 2024  9:21 AM [name] wrote: Medication: ALPRAZolam  (XANAX ) 1 MG tablet [[id number]]  Has the patient contacted their pharmacy? Yes, pharmacy is calling on patient's behalf. (Agent: If no, request that the patient contact the pharmacy for the refill. If patient does not wish to contact the pharmacy document the reason why and proceed with request.) (Agent: If yes, when and what did the pharmacy advise?)  This is the patient's preferred pharmacy:  [id number] - [place], [place] - [address] AT [facility] [address] [place] [address] Phone: [phone] Fax: [phone]  Is this the correct pharmacy for this prescription? Yes If no, delete pharmacy and type the correct one.   Has the prescription been filled recently? No  Is the patient out of the medication?Not sure  Has the patient been seen for an appointment in the last year OR does the patient have an upcoming appointment? Yes  Can we respond through MyChart? Yes  Agent: Please be advised that Rx refills may take up to 3 business days. We ask that you follow-up with your pharmacy.

## 2024-02-29 ENCOUNTER — Encounter: Payer: Self-pay | Admitting: Cardiovascular Disease

## 2024-02-29 DIAGNOSIS — R002 Palpitations: Secondary | ICD-10-CM

## 2024-02-29 DIAGNOSIS — I10 Essential (primary) hypertension: Secondary | ICD-10-CM

## 2024-02-29 DIAGNOSIS — I2510 Atherosclerotic heart disease of native coronary artery without angina pectoris: Secondary | ICD-10-CM

## 2024-03-07 ENCOUNTER — Other Ambulatory Visit: Payer: Self-pay | Admitting: Family Medicine

## 2024-03-07 DIAGNOSIS — F418 Other specified anxiety disorders: Secondary | ICD-10-CM

## 2024-03-08 LAB — CBC

## 2024-03-09 ENCOUNTER — Encounter: Payer: Self-pay | Admitting: Obstetrics and Gynecology

## 2024-03-09 ENCOUNTER — Encounter: Payer: Self-pay | Admitting: Cardiovascular Disease

## 2024-03-09 ENCOUNTER — Ambulatory Visit: Payer: Self-pay | Admitting: Cardiovascular Disease

## 2024-03-09 DIAGNOSIS — R232 Flushing: Secondary | ICD-10-CM

## 2024-03-09 DIAGNOSIS — N952 Postmenopausal atrophic vaginitis: Secondary | ICD-10-CM

## 2024-03-09 DIAGNOSIS — R002 Palpitations: Secondary | ICD-10-CM

## 2024-03-09 LAB — COMPREHENSIVE METABOLIC PANEL WITH GFR
ALT: 17 IU/L (ref 0–32)
AST: 23 IU/L (ref 0–40)
Albumin: 4.5 g/dL (ref 3.8–4.8)
Alkaline Phosphatase: 119 IU/L (ref 44–121)
BUN/Creatinine Ratio: 10 — AB (ref 12–28)
BUN: 6 mg/dL — ABNORMAL LOW (ref 8–27)
Bilirubin Total: 0.3 mg/dL (ref 0.0–1.2)
CO2: 20 mmol/L (ref 20–29)
Calcium: 9.5 mg/dL (ref 8.7–10.3)
Chloride: 103 mmol/L (ref 96–106)
Creatinine, Ser: 0.62 mg/dL (ref 0.57–1.00)
Globulin, Total: 2.8 g/dL (ref 1.5–4.5)
Glucose: 99 mg/dL (ref 70–99)
Potassium: 4.5 mmol/L (ref 3.5–5.2)
Sodium: 141 mmol/L (ref 134–144)
Total Protein: 7.3 g/dL (ref 6.0–8.5)
eGFR: 94 mL/min/1.73 (ref >59)

## 2024-03-09 LAB — CBC
Hematocrit: 43.7 (ref 34.0–46.6)
Hemoglobin: 14.5 g/dL (ref 11.1–15.9)
MCH: 31.2 pg (ref 26.6–33.0)
MCHC: 33.2 g/dL (ref 31.5–35.7)
MCV: 94 fL (ref 79–97)
Platelets: 350 x10E3/uL (ref 150–450)
RBC: 4.65 x10E6/uL (ref 3.77–5.28)
RDW: 12.4 (ref 11.7–15.4)
WBC: 7.2 x10E3/uL (ref 3.4–10.8)

## 2024-03-09 LAB — TSH: TSH: 1.64 u[IU]/mL (ref 0.450–4.500)

## 2024-03-10 ENCOUNTER — Ambulatory Visit: Payer: Medicare (Managed Care) | Attending: Cardiovascular Disease

## 2024-03-10 DIAGNOSIS — R002 Palpitations: Secondary | ICD-10-CM

## 2024-03-10 DIAGNOSIS — R232 Flushing: Secondary | ICD-10-CM

## 2024-03-10 NOTE — Progress Notes (Unsigned)
 Enrolled patient for 14 day Zio XT monitor to be mailed to patients home

## 2024-03-14 ENCOUNTER — Emergency Department (HOSPITAL_COMMUNITY): Payer: Medicare (Managed Care)

## 2024-03-14 ENCOUNTER — Other Ambulatory Visit: Payer: Self-pay

## 2024-03-14 ENCOUNTER — Emergency Department (HOSPITAL_COMMUNITY)
Admission: EM | Admit: 2024-03-14 | Discharge: 2024-03-14 | Disposition: A | Discharge status: Home / Self Care | Payer: Medicare (Managed Care) | Attending: Emergency Medicine | Admitting: Emergency Medicine

## 2024-03-14 DIAGNOSIS — Z7982 Long term (current) use of aspirin: Secondary | ICD-10-CM | POA: Diagnosis not present

## 2024-03-14 DIAGNOSIS — R232 Flushing: Secondary | ICD-10-CM

## 2024-03-14 DIAGNOSIS — R079 Chest pain, unspecified: Secondary | ICD-10-CM | POA: Insufficient documentation

## 2024-03-14 DIAGNOSIS — R0789 Other chest pain: Secondary | ICD-10-CM | POA: Diagnosis not present

## 2024-03-14 DIAGNOSIS — N951 Menopausal and female climacteric states: Secondary | ICD-10-CM | POA: Diagnosis not present

## 2024-03-14 LAB — BASIC METABOLIC PANEL WITH GFR
Anion gap: 11 (ref 5–15)
BUN: 6 mg/dL — ABNORMAL LOW (ref 8–23)
CO2: 21 mmol/L — ABNORMAL LOW (ref 22–32)
Calcium: 9 mg/dL (ref 8.9–10.3)
Chloride: 102 mmol/L (ref 98–111)
Creatinine, Ser: 0.62 mg/dL (ref 0.44–1.00)
GFR, Estimated: >60 mL/min (ref >60)
Glucose, Bld: 116 mg/dL — ABNORMAL HIGH (ref 70–99)
Potassium: 4.3 mmol/L (ref 3.5–5.1)
Sodium: 134 mmol/L — ABNORMAL LOW (ref 135–145)

## 2024-03-14 LAB — CBC WITH DIFFERENTIAL/PLATELET
Abs Immature Granulocytes: 0.03 K/uL (ref 0.00–0.07)
Basophils Absolute: 0.1 K/uL (ref 0.0–0.1)
Basophils Relative: 1 %
Eosinophils Absolute: 0.3 K/uL (ref 0.0–0.5)
Eosinophils Relative: 3 %
HCT: 40.7 % (ref 36.0–46.0)
Hemoglobin: 13.5 g/dL (ref 12.0–15.0)
Immature Granulocytes: 0 %
Lymphocytes Relative: 35 %
Lymphs Abs: 3.1 K/uL (ref 0.7–4.0)
MCH: 31.1 pg (ref 26.0–34.0)
MCHC: 33.2 g/dL (ref 30.0–36.0)
MCV: 93.8 fL (ref 80.0–100.0)
Monocytes Absolute: 0.9 K/uL (ref 0.1–1.0)
Monocytes Relative: 10 %
Neutro Abs: 4.4 K/uL (ref 1.7–7.7)
Neutrophils Relative %: 51 %
Platelets: 354 K/uL (ref 150–400)
RBC: 4.34 MIL/uL (ref 3.87–5.11)
RDW: 12.2 % (ref 11.5–15.5)
WBC: 8.7 K/uL (ref 4.0–10.5)
nRBC: 0.3 % — ABNORMAL HIGH (ref 0.0–0.2)

## 2024-03-14 LAB — TROPONIN I (HIGH SENSITIVITY)
Troponin I (High Sensitivity): 3 ng/L (ref <18)
Troponin I (High Sensitivity): 4 ng/L (ref <18)

## 2024-03-14 LAB — MAGNESIUM: Magnesium: 2.3 mg/dL (ref 1.7–2.4)

## 2024-03-14 NOTE — ED Provider Triage Note (Signed)
 Emergency Medicine Provider Triage Evaluation Note  CIEL CHERVENAK , a 73 y.o. female  was evaluated in triage.  Pt complains of palpitations, hot flashes.  She is wearing a Zio patch.  She started this yesterday.  Review of Systems  Positive: As above Negative: As above  Physical Exam  BP (!) 151/67 (BP Location: Right Arm)   Pulse 73   Temp 98.1 F (36.7 C)   Resp 17   SpO2 98%  Gen:   Awake, no distress   Resp:  Normal effort  MSK:   Moves extremities without difficulty  Other:    Medical Decision Making  Medically screening exam initiated at 3:08 PM.  Appropriate orders placed.  UNA YEOMANS was informed that the remainder of the evaluation will be completed by another provider, this initial triage assessment does not replace that evaluation, and the importance of remaining in the ED until their evaluation is complete.    Hildegard Loge, PA-C 03/14/24 [phone]

## 2024-03-14 NOTE — ED Triage Notes (Signed)
 POV/ ambulatory, brought in by husband/ wearing ZIO patch/ c/o CP and palpitations starting last night/ pt also reports sweating and not feeling well/ A&OX4

## 2024-03-14 NOTE — ED Provider Notes (Signed)
 [place] EMERGENCY DEPARTMENT AT [facility] Provider Note   CSN: [id number] Arrival date & time: 03/14/24  1438     Patient presents with: Chest Pain   Megan [name] is a 73 y.o. female here with hot flashes, night sweats, chest pain.  Episode again last night.  This has been going on for several weeks but the chest pain was new.  This occurred at rest.  Reports her hair has been falling out.  She is post-menopausal. Cardiology set her up with a zio heart monitor recently.   HPI     Prior to Admission medications   Medication Sig Start Date End Date Taking? Authorizing Provider  albuterol  (VENTOLIN  HFA) 108 (90 Base) MCG/ACT inhaler TAKE 2 PUFFS BY MOUTH EVERY 6 HOURS AS NEEDED FOR WHEEZE OR SHORTNESS OF BREATH 09/14/23   Thedora Garnette HERO, MD  ALPRAZolam  (XANAX ) 1 MG tablet Take 1 tablet (1 mg total) by mouth 2 (two) times daily as needed for anxiety. 02/18/24   Thedora Garnette HERO, MD  Alum & Mag Hydroxide-Simeth (MYLANTA PO) Take by mouth. PRN    [provider]  Alum Hydroxide-Mag Carbonate (GAVISCON PO) Take by mouth. PRN    [provider]  aspirin  EC 81 MG tablet Take 81 mg by mouth daily. Swallow whole.    [provider]  Biotin 10 MG TABS Take by mouth.    [provider]  cholecalciferol (VITAMIN D3) 25 MCG (1000 UNIT) tablet Take 5,000 Units by mouth daily.    [provider]  diltiazem  (CARDIZEM  CD) 180 MG 24 hr capsule Take 1 capsule (180 mg total) by mouth daily. 01/22/24   Wonda Sharper, MD  estradiol  (ESTRACE ) 0.1 MG/GM vaginal cream Place 0.5 g vaginally 2 (two) times a week. Place 0.5g nightly for two weeks then twice a week after 01/28/24   Zuleta, Kaitlin G, NP  Evolocumab  (REPATHA  SURECLICK) 140 MG/ML SOAJ Inject 140 mg into the skin every 14 (fourteen) days. 06/26/23   Wonda Sharper, MD  famotidine  (PEPCID ) 40 MG tablet Take 40 mg by mouth daily. 06/28/23   [provider]  FLUoxetine  (PROZAC ) 10 MG  capsule TAKE 1 CAPSULE BY MOUTH EVERY DAY 03/08/24   Thedora Garnette HERO, MD  fluticasone  (FLONASE ) 50 MCG/ACT nasal spray SPRAY 2 SPRAYS INTO EACH NOSTRIL EVERY DAY 06/25/22   Thedora Garnette HERO, MD  gabapentin  (NEURONTIN ) 300 MG capsule Take 1 in the morning, take 2 at night 11/04/23   Gayland Lauraine PARAS, NP  hydrocortisone  (ANUSOL -HC) 2.5 % rectal cream Place 1 Application rectally 2 (two) times daily. 08/28/23   Thedora Garnette HERO, MD  lisinopril  (ZESTRIL ) 5 MG tablet Take 1 tablet (5 mg total) by mouth daily. 01/06/24   Thedora Garnette HERO, MD  metoprolol  succinate (TOPROL -XL) 25 MG 24 hr tablet TAKE 1 TABLET (25 MG TOTAL) BY MOUTH DAILY. 12/31/23   Cooper, Michael, MD  Multiple Vitamin (MULTIVITAMIN) tablet Take 1 tablet by mouth daily.    [provider]  ondansetron  (ZOFRAN ) 4 MG tablet TAKE 1 TABLET BY MOUTH EVERY 8 HOURS AS NEEDED FOR NAUSEA AND VOMITING 10/16/23   Thedora Garnette HERO, MD  pantoprazole  (PROTONIX ) 40 MG tablet Take 1 tablet (40 mg total) by mouth daily. 02/24/23   Thedora Garnette HERO, MD  promethazine  (PHENERGAN ) 25 MG tablet Take 1 tablet (25 mg total) by mouth every 6 (six) hours as needed. 11/27/23   Thedora Garnette HERO, MD  sucralfate  (CARAFATE ) 1 g tablet Take 1 g  by mouth 4 (four) times daily. 02/23/23   [provider]  tiZANidine  (ZANAFLEX ) 4 MG tablet Take 1 tablet (4 mg total) by mouth 2 (two) times daily as needed for muscle spasms. 01/20/24   Onita Duos, MD  traZODone  (DESYREL ) 50 MG tablet TAKE 1/2 TO 1 TABLET BY MOUTH AT BEDTIME AS NEEDED FOR SLEEP 12/20/23   Webb, Padonda B, FNP  Vitamin E (VITAMIN E/D-ALPHA NATURAL) 268 MG (400 UNIT) CAPS Take by mouth.    [provider]    Allergies: Sulfamethoxazole, Amitriptyline , Atorvastatin , Cymbalta  [duloxetine  hcl], Ezetimibe , Lactose intolerance (gi), Lexapro  [escitalopram  oxalate], Rosuvastatin , Topamax  [topiramate ], Valsartan , and Prednisone     Review of Systems  Updated Vital Signs BP (!) 161/82   Pulse 66   Temp 98.1 F  (36.7 C) (Oral)   Resp 16   SpO2 100%   Physical Exam Constitutional:      General: She is not in acute distress. HENT:     Head: Normocephalic and atraumatic.  Eyes:     Conjunctiva/sclera: Conjunctivae normal.     Pupils: Pupils are equal, round, and reactive to light.  Cardiovascular:     Rate and Rhythm: Normal rate and regular rhythm.  Pulmonary:     Effort: Pulmonary effort is normal. No respiratory distress.  Abdominal:     General: There is no distension.     Tenderness: There is no abdominal tenderness.  Skin:    General: Skin is warm and dry.  Neurological:     General: No focal deficit present.     Mental Status: She is alert. Mental status is at baseline.  Psychiatric:        Mood and Affect: Mood normal.        Behavior: Behavior normal.     (all labs ordered are listed, but only abnormal results are displayed) Labs Reviewed  BASIC METABOLIC PANEL WITH GFR - Abnormal; Notable for the following components:      Result Value   Sodium 134 (*)    CO2 21 (*)    Glucose, Bld 116 (*)    BUN 6 (*)    All other components within normal limits  CBC WITH DIFFERENTIAL/PLATELET - Abnormal; Notable for the following components:   nRBC 0.3 (*)    All other components within normal limits  MAGNESIUM   TROPONIN I (HIGH SENSITIVITY)  TROPONIN I (HIGH SENSITIVITY)    EKG: EKG Interpretation Date/Time:  Monday March 14 2024 14:53:54 EDT Ventricular Rate:  74 PR Interval:  170 QRS Duration:  106 QT Interval:  400 QTC Calculation: 444 R Axis:   64  Text Interpretation: Normal sinus rhythm When compared with ECG of 16-Oct-2023 15:54, PREVIOUS ECG IS PRESENT Confirmed by Cottie Cough [phone]) on 03/14/2024 5:24:48 PM  Radiology: [facility] Chest 2 View Result Date: 03/14/2024 CLINICAL DATA:  Chest pain and palpitations starting last night EXAM: CHEST - 2 VIEW COMPARISON:  10/16/2023 FINDINGS: Normal heart size and pulmonary vascularity. No focal airspace disease or  consolidation in the lungs. No blunting of costophrenic angles. No pneumothorax. Mediastinal contours appear intact. Calcification of the aorta. Degenerative changes in the spine and shoulders. IMPRESSION: No active cardiopulmonary disease. Electronically Signed   By: Elsie Gravely M.D.   On: 03/14/2024 15:53     Procedures   Medications Ordered in the ED - No data to display  Medical Decision Making  This patient presents to the ED with concern for hot flashes, chest and neck pain. This involves an extensive number of treatment options, and is a complaint that carries with it a high risk of complications and morbidity.  The differential diagnosis includes hormonal dysfunction vs atypical ACS vs arrhythmia vs thyroid  disorder vs other   I ordered and personally interpreted labs.  The pertinent results include:  no emergent findings  I ordered imaging studies including dg chest I independently visualized and interpreted imaging which showed no emergent findings I agree with the radiologist interpretation  The patient was maintained on a cardiac monitor.  I personally viewed and interpreted the cardiac monitored which showed an underlying rhythm of: NSR  Per my interpretation the patient's ECG shows NSR no acute ischemic findings  I have reviewed the patients home medicines and have made adjustments as needed  Test Considered: doubt acute PE, meningitis, ICH, CVA, sepsis, thyroid  storm  Disposition:  After consideration of the diagnostic results and the patients response to treatment, I feel that the patent would benefit from close outpatient follow up.  My suspicion for ACS, aortic dissection, or life threatening emergency is quite low.  This has been an ongoing issue for some time which does need PCP follow up - potential outpatient workup for thyroid  dysfunction, hormone dysfunction, pheochromocytoma, etc - but she is stable for discharge and  comfortable leaving.      Final diagnoses:  Chest pain, unspecified type  Hot flashes    ED Discharge Orders     None          Cottie Donnice PARAS, MD 03/14/24 2017

## 2024-03-16 ENCOUNTER — Encounter: Payer: Self-pay | Admitting: Cardiovascular Disease

## 2024-03-16 ENCOUNTER — Ambulatory Visit: Payer: Medicare (Managed Care) | Attending: Cardiology | Admitting: Cardiovascular Disease

## 2024-03-16 VITALS — BP 150/81 | HR 77 | Ht 66.0 in | Wt 150.0 lb

## 2024-03-16 DIAGNOSIS — E782 Mixed hyperlipidemia: Secondary | ICD-10-CM

## 2024-03-16 DIAGNOSIS — I25119 Atherosclerotic heart disease of native coronary artery with unspecified angina pectoris: Secondary | ICD-10-CM

## 2024-03-16 DIAGNOSIS — I700 Atherosclerosis of aorta: Secondary | ICD-10-CM

## 2024-03-16 DIAGNOSIS — R002 Palpitations: Secondary | ICD-10-CM | POA: Diagnosis not present

## 2024-03-16 NOTE — Patient Instructions (Signed)
 Medication Instructions:  No Changes *If you need a refill on your cardiac medications before your next appointment, please call your pharmacy*  Lab Work: None  Testing/Procedures: Your physician has requested that you have a lexiscan myoview .  Please follow instruction sheet, as given.   How to prepare for your Myocardial Perfusion Test: Do not eat or drink 3 hours prior to your test, except you may have water . Do not consume products containing caffeine  (regular or decaffeinated) 12 hours prior to your test. (ex: coffee, chocolate, sodas, tea). Do bring a list of your current medications with you.  If not listed below, you may take your medications as normal. Do wear comfortable clothes (no dresses or overalls) and walking shoes, tennis shoes preferred (No heels or open toe shoes are allowed). Do NOT wear cologne, perfume, aftershave, or lotions (deodorant is allowed). The test will take approximately 3 to 4 hours to complete If these instructions are not followed, your test will have to be rescheduled.   Follow-Up: At [facility], you and your health needs are our priority.  As part of our continuing mission to provide you with exceptional heart care, our providers are all part of one team.  This team includes your primary Cardiologist (physician) and Advanced Practice Providers or APPs (Physician Assistants and Nurse Practitioners) who all work together to provide you with the care you need, when you need it.  Your next appointment:   1 year(s)  Provider:   Dr. Ozell Fell

## 2024-03-16 NOTE — Assessment & Plan Note (Signed)
 Patient treated with diltiazem  and metoprolol  succinate.  14-day ZIO in process.  Will review when completed.  Continue current management.  Reassess LV function with her Myoview  stress test

## 2024-03-16 NOTE — Progress Notes (Signed)
 Cardiology Office Note:    Date:  03/16/2024   ID:  Megan [name], DOB [birth year], MRN [id number]  PCP:  Wonda Sharper, MD   [facility] Providers Cardiologist:  None     Referring MD: Wonda Sharper, MD   Chief Complaint  Patient presents with   Palpitations    History of Present Illness:    Megan [name] is a 73 y.o. female with a hx of:  Coronary artery disease  Myoview  07/09/2016: EF 83, low risk Nonobstructive CAD >> CCTA 12/26/2021: CAC score 115 (72nd percentile); RCA mid and distal <25, LAD proximal and mid 25-49, mid-distal 50-69, LCx proximal 50-69, ascending aorta 33 mm; FFR normal-medical therapy Palpitations  Monitor 01/03/20: no significant arrhythmias  Monitor 12/2021: No A-fib, no PVCs, short SVT runs (11 seconds) Mild to mod TR TTE 12/04/2021: EF 60-65, normal RVSF, moderately elevated PASP, RVSP 52.1, trivial MR, mild to moderate TR, tricuspid regurgitant velocity 3.32 m/s  RHC 01/01/2022: Normal PA pressures, no evidence of pulmonary hypertension, PCWP 5, CO 5.5, CI 3.2 TTE 02/18/2023: EF 60-65, no RWMA, mild LVH, normal RVSF, normal PASP, no MR, mild TR Hypertension  Hyperlipidemia  Intol of statins  GERD  Hypothyroidism   The patient was seen in the emergency room on July 28 for evaluation of chest pain.  She has been having a lot of problems with hot flashes, night sweats, and fatigue.The patient is here alone today.  She called in recently with progressive heart palpitations and after reviewing her records I advised that she wear a 14-day ZIO monitor.  This is now in place and her symptoms have stabilized.  She continues to have periodic heart palpitations, more so at night.  She has also noticed that her resting heart rate is higher than baseline.  She has been compliant with metoprolol  and diltiazem .  The patient has had a variety of symptoms.  She has been most concerned about hot flashes and night sweats.  She went to the emergency  room just a few days ago for evaluation of chest pain where she experienced an aching pain in the left chest into the left arm.  She tried it and analgesics patch but this did not improve her symptoms.  Her emergency room evaluation showed a stable EKG and she had 2 sets of negative high-sensitivity troponins checked.  She has had no recurrence of chest discomfort.  Current Medications: Current Meds  Medication Sig   albuterol  (VENTOLIN  HFA) 108 (90 Base) MCG/ACT inhaler TAKE 2 PUFFS BY MOUTH EVERY 6 HOURS AS NEEDED FOR WHEEZE OR SHORTNESS OF BREATH   ALPRAZolam  (XANAX ) 1 MG tablet Take 1 tablet (1 mg total) by mouth 2 (two) times daily as needed for anxiety.   Alum & Mag Hydroxide-Simeth (MYLANTA PO) Take by mouth. PRN   Alum Hydroxide-Mag Carbonate (GAVISCON PO) Take by mouth. PRN   aspirin  EC 81 MG tablet Take 81 mg by mouth daily. Swallow whole.   Biotin 10 MG TABS Take by mouth.   cholecalciferol (VITAMIN D3) 25 MCG (1000 UNIT) tablet Take 5,000 Units by mouth daily.   diltiazem  (CARDIZEM  CD) 180 MG 24 hr capsule Take 1 capsule (180 mg total) by mouth daily.   estradiol  (ESTRACE ) 0.1 MG/GM vaginal cream Place 0.5 g vaginally 2 (two) times a week. Place 0.5g nightly for two weeks then twice a week after   Evolocumab  (REPATHA  SURECLICK) 140 MG/ML SOAJ Inject 140 mg into the skin every 14 (fourteen) days.   famotidine  (  PEPCID ) 40 MG tablet Take 40 mg by mouth daily.   fluticasone  (FLONASE ) 50 MCG/ACT nasal spray SPRAY 2 SPRAYS INTO EACH NOSTRIL EVERY DAY   gabapentin  (NEURONTIN ) 300 MG capsule Take 1 in the morning, take 2 at night   hydrocortisone  (ANUSOL -HC) 2.5 % rectal cream Place 1 Application rectally 2 (two) times daily.   lisinopril  (ZESTRIL ) 5 MG tablet Take 1 tablet (5 mg total) by mouth daily.   metoprolol  succinate (TOPROL -XL) 25 MG 24 hr tablet TAKE 1 TABLET (25 MG TOTAL) BY MOUTH DAILY.   ondansetron  (ZOFRAN ) 4 MG tablet TAKE 1 TABLET BY MOUTH EVERY 8 HOURS AS NEEDED FOR NAUSEA  AND VOMITING   pantoprazole  (PROTONIX ) 40 MG tablet Take 1 tablet (40 mg total) by mouth daily.   promethazine  (PHENERGAN ) 25 MG tablet Take 1 tablet (25 mg total) by mouth every 6 (six) hours as needed.   sucralfate  (CARAFATE ) 1 g tablet Take 1 g by mouth 4 (four) times daily.   tiZANidine  (ZANAFLEX ) 4 MG tablet Take 1 tablet (4 mg total) by mouth 2 (two) times daily as needed for muscle spasms.   traZODone  (DESYREL ) 50 MG tablet TAKE 1/2 TO 1 TABLET BY MOUTH AT BEDTIME AS NEEDED FOR SLEEP   Vitamin E (VITAMIN E/D-ALPHA NATURAL) 268 MG (400 UNIT) CAPS Take by mouth.   Current Facility-Administered Medications for the 03/16/24 encounter (Office Visit) with Wonda Sharper, MD  Medication   betamethasone  acetate-betamethasone  sodium phosphate  (CELESTONE ) injection 9 mg   bupivacaine (PF) (MARCAINE ) 0.5 % injection 1.5 mL     Allergies:   Sulfamethoxazole, Amitriptyline , Atorvastatin , Cymbalta  [duloxetine  hcl], Ezetimibe , Lactose intolerance (gi), Lexapro  [escitalopram  oxalate], Rosuvastatin , Topamax  [topiramate ], Valsartan , and Prednisone    ROS:   Please see the history of present illness.    All other systems reviewed and are negative.  EKGs/Labs/Other Studies Reviewed:    The following studies were reviewed today: Cardiac Studies & Procedures   ______________________________________________________________________________________________ CARDIAC CATHETERIZATION  CARDIAC CATHETERIZATION 01/01/2022  Conclusion Normal right heart pressures including normal PA pressures with no evidence of pulmonary hypertension  RA mean 0 RV 31/1 PA 20/2 mean 11 Pulmonary wedge pressure mean 5  Pulmonary artery oxygen saturation 76% SVC oxygen saturation 76% Aortic oxygen saturation 99%  Cardiac output 5.5 L/min Cardiac index 3.2 L/min/m  Findings Coronary Findings Diagnostic  Dominance: Right  No diagnostic findings have been documented. Intervention  No interventions have been  documented.   STRESS TESTS  MYOCARDIAL PERFUSION IMAGING 07/09/2016  Interpretation Summary  Nuclear stress EF: 83%.  The patient walked for 4 minutes of a Bruce protocol. Peak HR of 185 whicih is 97% PMHR. RBBB. No ST or T wave changes.  The study is normal.  This is a low risk study.  The left ventricular ejection fraction is hyperdynamic (>65%).   ECHOCARDIOGRAM  ECHOCARDIOGRAM COMPLETE 02/18/2023  Narrative ECHOCARDIOGRAM REPORT    Patient Name:   Megan [name] Date of Exam: 02/18/2023 Medical Rec #:  [id number]           Height:       66.0 in Accession #:    [id number]          Weight:       146.0 lb Date of Birth:  [birth year]          BSA:          1.749 m Patient Age:    71 years            BP:  161/89 mmHg Patient Gender: F                   HR:           80 bpm. 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 Signs/Symptoms:Murmur; Risk Factors:Hypertension and HLD.  Sonographer:    Waldo Guadalajara RCS Referring Phys: [phone] Orva Gwaltney  IMPRESSIONS   1. Left ventricular ejection fraction, by estimation, is 60 to 65%. The left ventricle has normal function. The left ventricle has no regional wall motion abnormalities. There is mild left ventricular hypertrophy. Left ventricular diastolic parameters were normal. 2. Right ventricular systolic function is normal. The right ventricular size is normal. There is normal pulmonary artery systolic pressure. 3. The mitral valve is normal in structure. No evidence of mitral valve regurgitation. 4. The aortic valve was not well visualized. Aortic valve regurgitation is not visualized.  FINDINGS Left Ventricle: Left ventricular ejection fraction, by estimation, is 60 to 65%. The left ventricle has normal  function. The left ventricle has no regional wall motion abnormalities. The left ventricular internal cavity size was normal in size. There is mild left ventricular hypertrophy. Left ventricular diastolic parameters were normal.  Right Ventricle: The right ventricular size is normal. Right ventricular systolic function is normal. There is normal pulmonary artery systolic pressure. The tricuspid regurgitant velocity is 2.77 m/s, and with an assumed right atrial pressure of 3 mmHg, the estimated right ventricular systolic pressure is 33.7 mmHg.  Left Atrium: Left atrial size was normal in size.  Right Atrium: Right atrial size was normal in size.  Pericardium: There is no evidence of pericardial effusion.  Mitral Valve: The mitral valve is normal in structure. No evidence of mitral valve regurgitation.  Tricuspid Valve: The tricuspid valve is normal in structure. Tricuspid valve regurgitation is mild.  Aortic Valve: The aortic valve was not well visualized. Aortic valve regurgitation is not visualized.  Pulmonic Valve: The pulmonic valve was normal in structure. Pulmonic valve regurgitation is not visualized.  Aorta: The aortic root and ascending aorta are structurally normal, with no evidence of dilitation.  IAS/Shunts: No atrial level shunt detected by color flow Doppler.   LEFT VENTRICLE PLAX 2D LVIDd:         3.60 cm   Diastology LVIDs:         2.10 cm   LV e' medial:    8.81 cm/s LV PW:         1.00 cm   LV E/e' medial:  10.3 LV IVS:        1.10 cm   LV e' lateral:   8.38 cm/s LVOT diam:     1.60 cm   LV E/e' lateral: 10.8 LV SV:         52 LV SV Index:   30 LVOT Area:     2.01 cm  3D Volume EF: 3D EF:        58 % LV EDV:       95 ml LV ESV:       40 ml LV SV:        55 ml  RIGHT VENTRICLE RV Basal diam:  3.10 cm RV S prime:     14.10 cm/s TAPSE (M-mode): 2.1 cm RVSP:  33.7 mmHg  LEFT ATRIUM             Index        RIGHT ATRIUM           Index LA diam:         4.00 cm 2.29 cm/m   RA Pressure: 3.00 mmHg LA Vol (A2C):   33.4 ml 19.09 ml/m  RA Area:     8.12 cm LA Vol (A4C):   26.2 ml 14.98 ml/m  RA Volume:   12.80 ml  7.32 ml/m LA Biplane Vol: 30.6 ml 17.49 ml/m AORTIC VALVE LVOT Vmax:   106.00 cm/s LVOT Vmean:  73.900 cm/s LVOT VTI:    0.259 m  AORTA Ao Root diam: 3.00 cm Ao Asc diam:  2.90 cm  MITRAL VALVE               TRICUSPID VALVE MV Area (PHT):             TR Peak grad:   30.7 mmHg MV Decel Time:             TR Vmax:        277.00 cm/s MV E velocity: 90.70 cm/s  Estimated RAP:  3.00 mmHg MV A velocity: 98.50 cm/s  RVSP:           33.7 mmHg MV E/A ratio:  0.92 SHUNTS Systemic VTI:  0.26 m Systemic Diam: 1.60 cm  Ronal Ross Electronically signed by Ronal Ross Signature Date/Time: 02/18/2023/10:37:59 AM    Final    MONITORS  LONG TERM MONITOR (3-14 DAYS) 03/17/2023  Narrative Patch Wear Time:  11 days and 9 hours (2024-07-13T09:13:37-0400 to 2024-07-24T19:07:25-0400)  Patient had a min HR of 52 bpm, max HR of 167 bpm, and avg HR of 77 bpm. Predominant underlying rhythm was Sinus Rhythm. Slight P wave morphology changes were noted. 87 Supraventricular Tachycardia runs occurred, the run with the fastest interval lasting 5 beats with a max rate of 167 bpm, the longest lasting 19 beats with an avg rate of 103 bpm. Isolated SVEs were occasional (1.6%, 20101), SVE Couplets were rare (<1.0%, 551), and SVE Triplets were rare (<1.0%, 197). Isolated VEs were rare (<1.0%), VE Couplets were rare (<1.0%), and no VE Triplets were present.  SUMMARY: as above, normal sinus rhythm with average HR of 77 bpm. Occasional supraventrcular ectopics and supraventricular runs as long as 19 beats. Rare ventricular ectopics. No sustained arrhythmia. No afib or flutter.   CT SCANS  CT CORONARY FRACTIONAL FLOW RESERVE DATA PREP 12/26/2021  Narrative EXAM: CT FFR ANALYSIS  CLINICAL DATA:  abnormal coronary CT  FINDINGS: FFRct analysis  was performed on the original cardiac CT angiogram dataset. Diagrammatic representation of the FFRct analysis is provided in a separate PDF document in PACS. This dictation was created using the PDF document and an interactive 3D model of the results. 3D model is not available in the EMR/PACS. Normal FFR range is >0.80. Indeterminate (grey) zone is 0.76-0.80. FFR delta of 0.13 is considered significant.  1. Left Main: FFR = 0.98  2. LAD: Proximal FFR = 0.96, mid FFR = 0.86, distal FFR = not mapped, D3 FFR = 0.90 3. LCX: Proximal FFR = 0.94, distal FFR = 0.92 4. RCA: Proximal FFR = 0.98, mid FFR =0.97, Distal FFR = 0.97  IMPRESSION: 1.  CT FFR analysis showed no significant stenosis.  RECOMMENDATIONS: Guideline-directed medical therapy and aggressive risk factor modification for secondary prevention of coronary artery disease.   Electronically  Signed By: Gayatri  Acharya M.D. On: 12/26/2021 10:30   CT SCANS  CT CORONARY MORPH W/CTA COR W/SCORE 12/24/2021  Addendum 12/26/2021 10:28 AM ADDENDUM REPORT: 12/26/2021 10:26  HISTORY: Chest pain, nonspecific  EXAM: Cardiac/Coronary  CT  TECHNIQUE: The patient was scanned on a [employer].  PROTOCOL: A 120 kV prospective scan was triggered in the descending thoracic aorta at 111 HU's. Axial non-contrast 3 mm slices were carried out through the heart. The data set was analyzed on a dedicated work station and scored using the Agatston method. Gantry rotation speed was 250 msecs and collimation was .6 mm. Beta blockade and 0.8 mg of sl NTG was given. The 3D data set was reconstructed in 5% intervals of the 35-75 % of the R-R cycle. Systolic and diastolic phases were analyzed on a dedicated work station using MPR, MIP and VRT modes. The patient received 95mL OMNIPAQUE  IOHEXOL  350 MG/ML SOLN contrast.  FINDINGS: Image quality: Good  Noise artifact is: Mild misregistration due to cardiac motion  Coronary calcium   score is 115, which places the patient in the 72 percentile for age and sex matched control.  Coronary arteries: Normal coronary origins.  Right dominance.  Right Coronary Artery: Minimal scattered atherosclerotic plaques in mid and distal RCA, <25% stenosis. Patent RPLA and RPDA.  Left Main Coronary Artery: No detectable plaque or stenosis.  Left Anterior Descending Coronary Artery: Mild mixed atherosclerotic plaque in the proximal and mid LAD, 25-49% stenosis. Moderate mixed atherosclerotic plaque in the mid-distal LAD 50-69% stenosis just beyond the bifurcation of the third diagonal artery. D3 has a mild atherosclerotic plaque at the level of the bifurcation.  Left Circumflex Artery: Moderate mixed atherosclerotic plaque in the proximal LCx, 50-69% stenosis. May be subject to blooming artifact from calcium  however calcium  honing sequence (sharp) used for interpretation.  Aorta: Normal size, 33 mm at the mid ascending aorta (level of the PA bifurcation) measured double oblique. Mild calcifications. No dissection.  Aortic Valve: No calcifications.  Other findings:  Normal pulmonary vein drainage into the left atrium.  Normal left atrial appendage without thrombus.  Normal size of the pulmonary artery.  IMPRESSION: 1. Moderate CAD in the mid-distal LAD and proximal LCx, CADRADS = 3. CT FFR will be performed and reported separately.  2. Coronary calcium  score is 115, which places the patient in the 72 percentile for age and sex matched control.  3. Normal coronary origins with right dominance.   Electronically Signed By: Soyla Merck M.D. On: 12/26/2021 10:26  Narrative EXAM: OVER-READ INTERPRETATION  CT CHEST  The following report is an over-read performed by radiologist Dr. Franky Crease of [facility] on 12/24/2021. This over-read does not include interpretation of cardiac or coronary anatomy or pathology. The coronary CTA interpretation by the  cardiologist is attached.  COMPARISON:  None Available.  FINDINGS: Vascular: Heart is normal size. Scattered calcifications in the descending thoracic aorta. No aneurysm.  Mediastinum/Nodes: No adenopathy  Lungs/Pleura: No confluent opacities or effusions.  Upper Abdomen: No acute findings  Musculoskeletal: Chest wall soft tissues are unremarkable. No acute bony abnormality.  IMPRESSION: Scattered calcifications in the descending thoracic aorta.  No acute extra cardiac abnormality.  Electronically Signed: By: Franky Crease M.D. On: 12/24/2021 19:05     ______________________________________________________________________________________________      EKG:        Recent Labs: 03/08/2024: ALT 17; TSH 1.640 03/14/2024: BUN 6; Creatinine, Ser 0.62; Hemoglobin 13.5; Magnesium  2.3; Platelets 354; Potassium 4.3; Sodium 134  Recent  Lipid Panel    Component Value Date/Time   CHOL 105 09/11/2022 0815   TRIG 125 09/11/2022 0815   HDL 51 09/11/2022 0815   CHOLHDL 2.1 09/11/2022 0815   CHOLHDL 3 07/29/2021 0803   VLDL 17.4 07/29/2021 0803   LDLCALC 32 09/11/2022 0815   LDLDIRECT 165.0 06/30/2011 1153     Risk Assessment/Calculations:            Physical Exam:    VS:  BP (!) 150/81   Pulse 77   Ht 5' 6 (1.676 m)   Wt 150 lb (68 kg)   SpO2 98%   BMI 24.21 kg/m     Wt Readings from Last 3 Encounters:  03/16/24 150 lb (68 kg)  02/03/24 151 lb 6.4 oz (68.7 kg)  01/14/24 153 lb (69.4 kg)     GEN:  Well nourished, well developed in no acute distress HEENT: Normal NECK: No JVD; No carotid bruits LYMPHATICS: No lymphadenopathy CARDIAC: RRR, no murmurs, rubs, gallops RESPIRATORY:  Clear to auscultation without rales, wheezing or rhonchi  ABDOMEN: Soft, non-tender, non-distended MUSCULOSKELETAL:  No edema; No deformity  SKIN: Warm and dry NEUROLOGIC:  Alert and oriented x 3 PSYCHIATRIC:  Normal affect   Assessment & Plan Coronary artery 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  She had a gated coronary CTA in 2023, demonstrating moderate mixed plaque in the mid LAD.  She has been statin intolerant.  She is currently taking Repatha .  I recommended a Lexiscan Myoview  stress study for further evaluation.  She remains on aspirin  81 mg daily. Mixed hyperlipidemia Treated with  Repatha .  Statin intolerant.  Last lipid showed an LDL cholesterol of 32. Abdominal aortic atherosclerosis (HCC) Treated with aspirin  and Repatha  Palpitations Patient treated with diltiazem  and metoprolol  succinate.  14-day ZIO in process.  Will review when completed.  Continue current management.  Reassess LV function with her Myoview  stress test       Informed Consent   Shared Decision Making/Informed Consent The risks [chest pain, shortness of breath, cardiac arrhythmias, dizziness, blood pressure fluctuations, myocardial infarction, stroke/transient ischemic attack, nausea, vomiting, allergic reaction, radiation exposure, metallic taste sensation and life-threatening complications (estimated to be 1 in 10,000)], benefits (risk stratification, diagnosing coronary artery disease, treatment guidance) and alternatives of a nuclear stress test were discussed in detail with Megan [name] and she agrees to proceed.       Medication Adjustments/Labs and Tests Ordered: Current medicines are reviewed at length with the patient today.  Concerns regarding medicines are outlined above.  Orders Placed This Encounter  Procedures   Cardiac Stress Test: Informed Consent Details: Physician/Practitioner Attestation; Transcribe to consent form and obtain patient signature   MYOCARDIAL PERFUSION IMAGING   No orders of the defined types were placed in this encounter.   Patient Instructions  Medication Instructions:  No Changes *If you need a refill on your cardiac medications  before your next appointment, please call your pharmacy*  Lab Work: None  Testing/Procedures: Your physician has requested that you have a lexiscan myoview .  Please follow instruction sheet, as given.   How to prepare for your Myocardial Perfusion Test: Do not eat or drink 3 hours prior to your test, except you may have water . Do not consume products containing caffeine  (regular or decaffeinated) 12 hours prior to your test. (ex: coffee, chocolate, sodas, tea). Do bring a list of your current medications with you.  If not listed below, you may take your medications as normal. Do wear comfortable clothes (no dresses or overalls) and walking shoes, tennis shoes preferred (No heels or open toe shoes are allowed). Do NOT wear cologne, perfume, aftershave, or lotions (deodorant is allowed). The test will take approximately 3 to 4 hours to complete If these instructions are not followed, your test will have to be rescheduled.   Follow-Up: At [facility], you and your health needs are our priority.  As part of our continuing mission to provide you with exceptional heart care, our providers are all part of one team.  This team includes your primary Cardiologist (physician) and Advanced Practice Providers or APPs (Physician Assistants and Nurse Practitioners) who all work together to provide you with the care you need, when you need it.  Your next appointment:   1 year(s)  Provider:   Dr. Ozell Fell    Signed, Ozell Fell, MD  03/16/2024 1:06 PM    [facility]

## 2024-03-16 NOTE — Assessment & Plan Note (Signed)
 Treated with  Repatha .  Statin intolerant.  Last lipid showed an LDL cholesterol of 32.

## 2024-03-16 NOTE — Assessment & Plan Note (Signed)
 The patient has chest pain with typical and atypical features.  She had a gated coronary CTA in 2023, demonstrating moderate mixed plaque in the mid LAD.  She has been statin intolerant.  She is currently taking Repatha .  I recommended a Lexiscan Myoview  stress study for further evaluation.  She remains on aspirin  81 mg daily.

## 2024-03-16 NOTE — Assessment & Plan Note (Signed)
 Treated with aspirin  and Repatha 

## 2024-03-17 ENCOUNTER — Telehealth (HOSPITAL_COMMUNITY): Payer: Self-pay | Admitting: Cardiovascular Disease

## 2024-03-17 ENCOUNTER — Telehealth: Payer: Self-pay | Admitting: Neurology

## 2024-03-17 ENCOUNTER — Encounter: Payer: Self-pay | Admitting: Family Medicine

## 2024-03-17 MED ORDER — METOPROLOL SUCCINATE ER 25 MG PO TB24
25.0000 mg | ORAL_TABLET | Freq: Every day | ORAL | 3 refills | Status: DC
Start: 2024-03-17 — End: 2024-05-24

## 2024-03-17 MED ORDER — DULOXETINE HCL 30 MG PO CPEP: 30.0000 mg | ORAL_CAPSULE | Freq: Every day | ORAL | 3 refills | Status: DC

## 2024-03-17 NOTE — Addendum Note (Signed)
 Addended by: JOSHUA IZETTA CROME on: 03/17/2024 04:47 PM   Modules accepted: Orders

## 2024-03-17 NOTE — Telephone Encounter (Signed)
 Pt called wanting to know if she can get another Rx for her to get back on Cymbalta . Please advise.

## 2024-03-17 NOTE — Telephone Encounter (Signed)
 Meds ordered this encounter  Medications   DULoxetine  (CYMBALTA ) 30 MG capsule    Sig: Take 1 capsule (30 mg total) by mouth daily.    Dispense:  30 capsule    Refill:  3

## 2024-03-17 NOTE — Telephone Encounter (Signed)
 03/17/24 Patient decided not to have and if she decides to have she will call us  back. Order will be removed from the [facility]. If patient calls back we will reinstate the order. Thank you.

## 2024-03-17 NOTE — Addendum Note (Signed)
 Addended by: Colleen Kotlarz on: 03/17/2024 04:51 PM   Modules accepted: Orders

## 2024-03-24 ENCOUNTER — Encounter: Payer: Self-pay | Admitting: Radiology

## 2024-03-24 ENCOUNTER — Ambulatory Visit: Payer: Medicare (Managed Care) | Admitting: Radiology

## 2024-03-24 VITALS — BP 136/74 | HR 68 | Wt 152.0 lb

## 2024-03-24 DIAGNOSIS — R232 Flushing: Secondary | ICD-10-CM | POA: Diagnosis not present

## 2024-03-24 DIAGNOSIS — R82998 Other abnormal findings in urine: Secondary | ICD-10-CM | POA: Diagnosis not present

## 2024-03-24 LAB — URINALYSIS, COMPLETE W/RFL CULTURE
Bacteria, UA: NONE SEEN /HPF
Bilirubin Urine: NEGATIVE
Glucose, UA: NEGATIVE
Hgb urine dipstick: NEGATIVE
Hyaline Cast: NONE SEEN /LPF
Ketones, ur: NEGATIVE
Leukocyte Esterase: NEGATIVE
Nitrites, Initial: NEGATIVE
Protein, ur: NEGATIVE
RBC / HPF: NONE SEEN /HPF (ref 0–2)
Specific Gravity, Urine: 1.01 (ref 1.001–1.035)
WBC, UA: NONE SEEN /HPF (ref 0–5)
pH: 6 (ref 5.0–8.0)

## 2024-03-24 LAB — NO CULTURE INDICATED

## 2024-03-24 NOTE — Progress Notes (Signed)
   Megan [name] 1950-11-02 [id number]   History: Postmenopausal 73 y.o. presents as a new patient with c/o new onset hot flashes for a few months. Went through menopause 20 years ago, had hot flashes for only a couple years at that point.  Hot flashes are happening every two hours at night accompanied by heart pounding and irregular palpitations. She is being worked up by cardiology currently wearing a heart monitor.  She has been srtuggling with recurrent UTIs, followed by urogyn. Recently started on vaginal estrogen. C/o dark urine today. Would like a u/a run.  Gynecologic History Postmenopausal S/p hyst  Obstetric History OB History  Gravida Para Term Preterm AB Living  6 4   2 4   SAB IAB Ectopic Multiple Live Births      4    # Outcome Date GA Lbr Len/2nd Weight Sex Type Anes PTL Lv  6 AB           5 AB           4 Para           3 Para           2 Para           1 Para                The following portions of the patient's history were reviewed and updated as appropriate: allergies, current medications, past family history, past medical history, past social history, past surgical history, and problem list.  Review of Systems Pertinent items noted in HPI and remainder of comprehensive ROS otherwise negative.  Past medical history, past surgical history, family history and social history were all reviewed and documented in the EPIC chart.  Exam:  Vitals:   03/24/24 1329  BP: 136/74  Pulse: 68  SpO2: 96%  Weight: 152 lb (68.9 kg)   Body mass index is 24.53 kg/m.  Physical Exam Constitutional:      Appearance: Normal appearance. She is normal weight.  Pulmonary:     Effort: Pulmonary effort is normal.  Neurological:     Mental Status: She is alert.  Psychiatric:        Mood and Affect: Mood is anxious.        Thought Content: Thought content normal.        Judgment: Judgment normal.     Urine dipstick shows negative for all components.  Micro exam:  negative for WBC's or RBC's.   Assessment/Plan:   1. Hot flashes (Primary) Reassured unlikely related to hormones this late after menopause. Normal thyroid  testing last month. May be related to anxiety or cardiac condition. Recommend she discuss changing anxiety meds to effexor  or paxil to help with the anxiety and the hot flashes  2. Dark urine Reassured negative Continue vaginal estrogen - Urinalysis,Complete w/RFL Culture   Megan [name] B WHNP-BC, 2:21 PM 03/24/2024

## 2024-03-30 ENCOUNTER — Encounter (HOSPITAL_COMMUNITY): Payer: Medicare (Managed Care)

## 2024-04-06 ENCOUNTER — Ambulatory Visit: Payer: Self-pay

## 2024-04-06 NOTE — Telephone Encounter (Signed)
 Pt will be using UC.   FYI Only or Action Required?: FYI only for provider.  Patient was last seen in primary care on 02/03/2024 by Thedora Garnette HERO, MD.  Called Nurse Triage reporting Back Pain.  Symptoms began a week ago.  Interventions attempted: Nothing.  Symptoms are: gradually worsening.  Triage Disposition: See HCP Within 4 Hours (Or PCP Triage)  Patient/caregiver understands and will follow disposition?: Yes, will follow disposition  Copied from CRM #[id number]. Topic: Clinical - Red Word Triage >> Apr 06, 2024 10:05 AM Revonda D wrote: Red Word that prompted transfer to Nurse Triage: pain  Pt stated that she is experiencing pain in her lower left back and in the front. Pt stated that she is also still having hot flashes which she was recently seen for with the provider. Pt is wanting to scheduled an appt with provider.    ----------------------------------------------------------------------- From previous Reason for Contact - Scheduling: Patient/patient representative is calling to schedule an appointment. Refer to attachments for appointment information. Reason for Disposition  [1] Pain or burning with passing urine (urination) AND [2] flank (e.g., in side of back, below ribs and above hip)  Answer Assessment - Initial Assessment Questions 1. ONSET: When did the pain begin? (e.g., minutes, hours, days)     About a week 2. LOCATION: Where does it hurt? (upper, mid or lower back)     Lower, L 3. SEVERITY: How bad is the pain?  (e.g., Scale 1-10; mild, moderate, or severe)     Mild this  AM 4. PATTERN: Is the pain constant? (e.g., yes, no; constant, intermittent)      intermittent 5. RADIATION: Does the pain shoot into your legs or somewhere else?     To front 6. CAUSE:  What do you think is causing the back pain?      unsure 8. MEDICINES: What have you taken so far for the pain? (e.g., nothing, acetaminophen , NSAIDS)     denies 9. NEUROLOGIC SYMPTOMS: Do  you have any weakness, numbness, or problems with bowel/bladder control?     denies 10. OTHER SYMPTOMS: Do you have any other symptoms? (e.g., fever, abdomen pain, burning with urination, blood in urine)       Urge to go but cannot, increase urination at noc,  Protocols used: Back Pain-A-AH

## 2024-04-07 ENCOUNTER — Encounter (HOSPITAL_COMMUNITY): Payer: Self-pay

## 2024-04-07 ENCOUNTER — Emergency Department (HOSPITAL_COMMUNITY)
Admission: EM | Admit: 2024-04-07 | Discharge: 2024-04-07 | Disposition: A | Discharge status: Home / Self Care | Payer: Medicare (Managed Care) | Attending: Emergency Medicine | Admitting: Emergency Medicine

## 2024-04-07 ENCOUNTER — Other Ambulatory Visit: Payer: Self-pay

## 2024-04-07 ENCOUNTER — Other Ambulatory Visit: Payer: Self-pay | Admitting: Family Medicine

## 2024-04-07 ENCOUNTER — Emergency Department (HOSPITAL_COMMUNITY): Payer: Medicare (Managed Care)

## 2024-04-07 DIAGNOSIS — Z7982 Long term (current) use of aspirin: Secondary | ICD-10-CM | POA: Diagnosis not present

## 2024-04-07 DIAGNOSIS — K5900 Constipation, unspecified: Secondary | ICD-10-CM | POA: Insufficient documentation

## 2024-04-07 DIAGNOSIS — R109 Unspecified abdominal pain: Secondary | ICD-10-CM

## 2024-04-07 DIAGNOSIS — F418 Other specified anxiety disorders: Secondary | ICD-10-CM

## 2024-04-07 LAB — CBC WITH DIFFERENTIAL/PLATELET
Abs Immature Granulocytes: 0.02 K/uL (ref 0.00–0.07)
Basophils Absolute: 0 K/uL (ref 0.0–0.1)
Basophils Relative: 0 %
Eosinophils Absolute: 0.2 K/uL (ref 0.0–0.5)
Eosinophils Relative: 2 %
HCT: 45.7 % (ref 36.0–46.0)
Hemoglobin: 14.9 g/dL (ref 12.0–15.0)
Immature Granulocytes: 0 %
Lymphocytes Relative: 29 %
Lymphs Abs: 2.8 K/uL (ref 0.7–4.0)
MCH: 31.3 pg (ref 26.0–34.0)
MCHC: 32.6 g/dL (ref 30.0–36.0)
MCV: 96 fL (ref 80.0–100.0)
Monocytes Absolute: 0.9 K/uL (ref 0.1–1.0)
Monocytes Relative: 9 %
Neutro Abs: 6 K/uL (ref 1.7–7.7)
Neutrophils Relative %: 60 %
Platelets: 303 K/uL (ref 150–400)
RBC: 4.76 MIL/uL (ref 3.87–5.11)
RDW: 12.3 % (ref 11.5–15.5)
WBC: 10 K/uL (ref 4.0–10.5)
nRBC: 0 % (ref 0.0–0.2)

## 2024-04-07 LAB — URINALYSIS, ROUTINE W REFLEX MICROSCOPIC
Bilirubin Urine: NEGATIVE
Glucose, UA: NEGATIVE mg/dL
Ketones, ur: NEGATIVE mg/dL
Nitrite: NEGATIVE
Protein, ur: NEGATIVE mg/dL
Specific Gravity, Urine: 1.01 (ref 1.005–1.030)
pH: 5 (ref 5.0–8.0)

## 2024-04-07 LAB — COMPREHENSIVE METABOLIC PANEL WITH GFR
ALT: 19 U/L (ref 0–44)
AST: 24 U/L (ref 15–41)
Albumin: 4.1 g/dL (ref 3.5–5.0)
Alkaline Phosphatase: 84 U/L (ref 38–126)
Anion gap: 11 (ref 5–15)
BUN: 9 mg/dL (ref 8–23)
CO2: 20 mmol/L — ABNORMAL LOW (ref 22–32)
Calcium: 9.1 mg/dL (ref 8.9–10.3)
Chloride: 104 mmol/L (ref 98–111)
Creatinine, Ser: 0.62 mg/dL (ref 0.44–1.00)
GFR, Estimated: >60 mL/min (ref >60)
Glucose, Bld: 110 mg/dL — ABNORMAL HIGH (ref 70–99)
Potassium: 4 mmol/L (ref 3.5–5.1)
Sodium: 135 mmol/L (ref 135–145)
Total Bilirubin: 0.3 mg/dL (ref 0.0–1.2)
Total Protein: 7.7 g/dL (ref 6.5–8.1)

## 2024-04-07 LAB — LIPASE, BLOOD: Lipase: 24 U/L (ref 11–51)

## 2024-04-07 MED ORDER — IOHEXOL 300 MG/ML  SOLN
100.0000 mL | Freq: Once | INTRAMUSCULAR | Status: AC | PRN
  Administered 2024-04-07: 100 mL via INTRAVENOUS

## 2024-04-07 MED ORDER — MAGNESIUM CITRATE PO SOLN: 1.0000 | Freq: Once | ORAL | 0 refills | Status: AC

## 2024-04-07 MED ORDER — DICYCLOMINE HCL 20 MG PO TABS: 20.0000 mg | ORAL_TABLET | Freq: Two times a day (BID) | ORAL | 0 refills | Status: AC

## 2024-04-07 MED ORDER — FENTANYL CITRATE PF 50 MCG/ML IJ SOSY
50.0000 ug | PREFILLED_SYRINGE | Freq: Once | INTRAMUSCULAR | Status: AC
  Administered 2024-04-07: 50 ug via INTRAVENOUS
  Filled 2024-04-07: qty 1

## 2024-04-07 MED ORDER — KETOROLAC TROMETHAMINE 15 MG/ML IJ SOLN
15.0000 mg | Freq: Once | INTRAMUSCULAR | Status: AC
  Administered 2024-04-07: 15 mg via INTRAVENOUS
  Filled 2024-04-07: qty 1

## 2024-04-07 MED ORDER — AMOXICILLIN-POT CLAVULANATE 875-125 MG PO TABS: 1.0000 | ORAL_TABLET | Freq: Two times a day (BID) | ORAL | 0 refills | Status: DC

## 2024-04-07 MED ORDER — ONDANSETRON HCL 4 MG/2ML IJ SOLN
4.0000 mg | Freq: Once | INTRAMUSCULAR | Status: AC
  Administered 2024-04-07: 4 mg via INTRAVENOUS
  Filled 2024-04-07: qty 2

## 2024-04-07 MED ORDER — MILK AND MOLASSES ENEMA
1.0000 | Freq: Once | RECTAL | Status: AC
  Administered 2024-04-07: 150 mL via RECTAL
  Filled 2024-04-07: qty 150

## 2024-04-07 NOTE — Discharge Instructions (Addendum)
 You were seen in the ER for abdominal discomfort, bladder discomfort.  CT scan shows constipation, but no other complications.  We are prescribing you mag citrate, that should help with constipation.  Additionally, Augmentin  prescription has been provided.  This is an antibiotic, that you can opt to start taking if you have suspected diverticulitis might also be related to this event.  Additionally, please call urology at the number provided to set up an urgent evaluation by them.  Take Bentyl  for spasms along with round-the-clock Tylenol  or ibuprofen  for pain control.

## 2024-04-07 NOTE — ED Notes (Addendum)
 Writer administered enema. Pt advised she felt pressure, but once enema was complete it started pouring out. Pt advised this keeps happening when she administers enemas at home.

## 2024-04-07 NOTE — ED Triage Notes (Signed)
 Pt reports hx of intestinal problems and is seen by GI with [facility]. Pt reports frequent bladder infections recently. No BM in past week, gave self enema last night with no relief. Pt reports episode of dizziness last night and she lost control of bladder at the same time. Abdominal distention noted. Pt reports significant mid/lower abdominal cramping since taking enema that has remained constant. Associated nausea. Denies vomiting, fever.

## 2024-04-07 NOTE — ED Provider Notes (Addendum)
 [place] [facility] AT [facility] Provider Note   CSN: [id number] Arrival date & time: 04/07/24  9366     Patient presents with: Abdominal Pain and Constipation   Megan [name] is a 74 y.o. female.   HPI     73 year old patient comes in with chief complaint of abdominal discomfort and also urinary discomfort.  Patient has history of chronic constipation, diverticulitis, IBS, gastritis, hernia and has urinary sling in place.  She reports chronic UTI as well.  Patient indicates that she has not had a bowel movements in 2 weeks. In the last few days, she has noted some urinary discomfort as well.  Patient's UTI typically manifest as urinary retention followed by frequent urination.  She is experiencing that right now.  There is no burning with urination.  She also has lower quadrant abdominal pressure and intermittent episodes of cramping abdominal pain.  Patient has used enema without any relief at home.  Yesterday she had an episode of urinary incontinence at 4 PM after enema.  Subsequently she has voided normally.  Patient has been having hot flashes for several months.  PCP evaluating for carcinoid tumor. GI doctors are at [facility].  Prior to Admission medications   Medication Sig Start Date End Date Taking? Authorizing Provider  amoxicillin -clavulanate (AUGMENTIN ) 875-125 MG tablet Take 1 tablet by mouth every 12 (twelve) hours. 04/07/24  Yes Charlyn Sora, MD  dicyclomine  (BENTYL ) 20 MG tablet Take 1 tablet (20 mg total) by mouth 2 (two) times daily. 04/07/24  Yes Charlyn Sora, MD  magnesium  citrate SOLN Take 296 mLs (1 Bottle total) by mouth once for 1 dose. 04/07/24 04/07/24 Yes Charlyn Sora, MD  albuterol  (VENTOLIN  HFA) 108 (90 Base) MCG/ACT inhaler TAKE 2 PUFFS BY MOUTH EVERY 6 HOURS AS NEEDED FOR WHEEZE OR SHORTNESS OF BREATH 09/14/23   Thedora Garnette HERO, MD  ALPRAZolam  (XANAX ) 1 MG tablet Take 1 tablet (1 mg total) by mouth 2 (two) times  daily as needed for anxiety. 02/18/24   Thedora Garnette HERO, MD  Alum & Mag Hydroxide-Simeth (MYLANTA PO) Take by mouth. PRN    [provider]  Alum Hydroxide-Mag Carbonate (GAVISCON PO) Take by mouth. PRN    [provider]  aspirin  EC 81 MG tablet Take 81 mg by mouth daily. Swallow whole.    [provider]  Biotin 10 MG TABS Take by mouth.    [provider]  cholecalciferol (VITAMIN D3) 25 MCG (1000 UNIT) tablet Take 5,000 Units by mouth daily.    [provider]  diltiazem  (CARDIZEM  CD) 180 MG 24 hr capsule Take 1 capsule (180 mg total) by mouth daily. 01/22/24   Wonda Sharper, MD  DULoxetine  (CYMBALTA ) 30 MG capsule Take 1 capsule (30 mg total) by mouth daily. Patient not taking: Reported on 03/24/2024 03/17/24   Onita Duos, MD  estradiol  (ESTRACE ) 0.1 MG/GM vaginal cream Place 0.5 g vaginally 2 (two) times a week. Place 0.5g nightly for two weeks then twice a week after 01/28/24   Zuleta, Kaitlin G, NP  Evolocumab  (REPATHA  SURECLICK) 140 MG/ML SOAJ Inject 140 mg into the skin every 14 (fourteen) days. 06/26/23   Wonda Sharper, MD  famotidine  (PEPCID ) 40 MG tablet Take 40 mg by mouth daily. 06/28/23   [provider]  fluticasone  (FLONASE ) 50 MCG/ACT nasal spray SPRAY 2 SPRAYS INTO EACH NOSTRIL EVERY DAY 06/25/22   Thedora Garnette HERO, MD  gabapentin  (NEURONTIN ) 300 MG capsule Take 1 in the morning, take  2 at night 11/04/23   Gayland Lauraine PARAS, NP  hydrocortisone  (ANUSOL -HC) 2.5 % rectal cream Place 1 Application rectally 2 (two) times daily. 08/28/23   Thedora Garnette HERO, MD  lisinopril  (ZESTRIL ) 5 MG tablet Take 1 tablet (5 mg total) by mouth daily. Patient not taking: Reported on 03/24/2024 01/06/24   Thedora Garnette HERO, MD  metoprolol  succinate (TOPROL -XL) 25 MG 24 hr tablet Take 1 tablet (25 mg total) by mouth daily. 03/17/24   Wonda Sharper, MD  ondansetron  (ZOFRAN ) 4 MG tablet TAKE 1 TABLET BY MOUTH EVERY 8 HOURS AS NEEDED FOR NAUSEA AND VOMITING 10/16/23    Thedora Garnette HERO, MD  pantoprazole  (PROTONIX ) 40 MG tablet Take 1 tablet (40 mg total) by mouth daily. 02/24/23   Thedora Garnette HERO, MD  promethazine  (PHENERGAN ) 25 MG tablet Take 1 tablet (25 mg total) by mouth every 6 (six) hours as needed. 11/27/23   Thedora Garnette HERO, MD  sucralfate  (CARAFATE ) 1 g tablet Take 1 g by mouth 4 (four) times daily. 02/23/23   [provider]  Tenapanor HCl 50 MG TABS Take by mouth.    [provider]  tiZANidine  (ZANAFLEX ) 4 MG tablet Take 1 tablet (4 mg total) by mouth 2 (two) times daily as needed for muscle spasms. 01/20/24   Onita Duos, MD  traZODone  (DESYREL ) 50 MG tablet TAKE 1/2 TO 1 TABLET BY MOUTH AT BEDTIME AS NEEDED FOR SLEEP 12/20/23   Webb, Padonda B, FNP  Vitamin E (VITAMIN E/D-ALPHA NATURAL) 268 MG (400 UNIT) CAPS Take by mouth.    [provider]    Allergies: Sulfamethoxazole, Amitriptyline , Atorvastatin , Cymbalta  [duloxetine  hcl], Ezetimibe , Lactose intolerance (gi), Lexapro  [escitalopram  oxalate], Rosuvastatin , Topamax  [topiramate ], Valsartan , and Prednisone     Review of Systems  All other systems reviewed and are negative.   Updated Vital Signs BP 131/62   Pulse 69   Temp 98.9 F (37.2 C) (Oral)   Resp 16   Ht 5' 6 (1.676 m)   Wt 68 kg   SpO2 100%   BMI 24.21 kg/m   Physical Exam Vitals and nursing note reviewed.  Constitutional:      Appearance: She is well-developed.  HENT:     Head: Atraumatic.  Cardiovascular:     Rate and Rhythm: Normal rate.  Pulmonary:     Effort: Pulmonary effort is normal.  Abdominal:     Tenderness: There is abdominal tenderness in the right lower quadrant, periumbilical area, suprapubic area and left lower quadrant. There is no guarding.  Musculoskeletal:     Cervical back: Normal range of motion and neck supple.  Skin:    General: Skin is warm and dry.  Neurological:     Mental Status: She is alert and oriented to person, place, and time.     (all labs ordered are listed,  but only abnormal results are displayed) Labs Reviewed  COMPREHENSIVE METABOLIC PANEL WITH GFR - Abnormal; Notable for the following components:      Result Value   CO2 20 (*)    Glucose, Bld 110 (*)    All other components within normal limits  URINALYSIS, ROUTINE W REFLEX MICROSCOPIC - Abnormal; Notable for the following components:   APPearance HAZY (*)    Hgb urine dipstick SMALL (*)    Leukocytes,Ua SMALL (*)    Bacteria, UA RARE (*)    All other components within normal limits  LIPASE, BLOOD  CBC WITH DIFFERENTIAL/PLATELET    EKG: None  Radiology: CT ABDOMEN PELVIS W  CONTRAST Result Date: 04/07/2024 CLINICAL DATA:  Abdominal pain, acute, nonlocalized. EXAM: CT ABDOMEN AND PELVIS WITH CONTRAST TECHNIQUE: Multidetector CT imaging of the abdomen and pelvis was performed using the standard protocol following bolus administration of intravenous contrast. RADIATION DOSE REDUCTION: This exam was performed according to the departmental dose-optimization program which includes automated exposure control, adjustment of the mA and/or kV according to patient size and/or use of iterative reconstruction technique. CONTRAST:  OMNIPAQUE  IOHEXOL  300 MG/ML  SOLN COMPARISON:  CT 02/11/2011 and 12/18/2022.  MRI 05/03/2023 FINDINGS: Lower chest: Lung bases are clear.  No pleural or pericardial fluid. Hepatobiliary: No change in a 1.7 cm cyst in the lateral segment of the left lobe of the liver. No other liver parenchymal finding. No calcified gallstones or CT evidence of gallbladder inflammation. Pancreas: Normal Spleen: Normal Adrenals/Urinary Tract: Chronic 1.5 cm enhancing adrenal nodule on the left. Stability since 2012 ensures benign nature. No additional follow-up recommended. No evidence of renal stone or obstruction. No renal mass. 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 Small right renal cyst for which no follow-up is recommended. Bladder is normal. Stomach/Bowel: Stomach is  normal. Small bowel is normal. No visible appendix. Fluid contents of the colon. Diverticulosis of the left colon. No diverticulitis is identified. Low level diverticulitis can be inapparent at imaging. Vascular/Lymphatic: Aortic atherosclerosis. No aneurysm. IVC is normal. No adenopathy. Reproductive: Previous hysterectomy.  No pelvic mass. Other: No free fluid or air. Musculoskeletal: Ordinary mild spinal degenerative changes. IMPRESSION: 1. No acute finding to explain the clinical presentation. 2. Diverticulosis of the left colon without evidence of diverticulitis. Low level diverticulitis can be inapparent at imaging. Aortic Atherosclerosis (ICD10-I70.0). Electronically Signed   By: Oneil Officer M.D.   On: 04/07/2024 09:20     Procedures   Medications Ordered in the ED  iohexol  (OMNIPAQUE ) 300 MG/ML solution 100 mL (100 mLs Intravenous Contrast Given 04/07/24 0903)  fentaNYL  (SUBLIMAZE ) injection 50 mcg (50 mcg Intravenous Given 04/07/24 0918)  ondansetron  (ZOFRAN ) injection 4 mg (4 mg Intravenous Given 04/07/24 0919)  milk and molasses enema (150 mLs Rectal Given 04/07/24 1025)  ketorolac  (TORADOL ) 15 MG/ML injection 15 mg (15 mg Intravenous Given 04/07/24 1102)    Clinical Course as of 04/07/24 1247  Thu Apr 07, 2024  0745 Old MRI abd IMPRESSION:   1. 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  2.  Duodenal diverticulum, unchanged and without evidence of discrete mass lesion or biliary obstruction.  3.  Additional findings as above.   [AN]    Clinical Course User Index [AN] Charlyn Sora, MD                                 Medical Decision Making Amount and/or Complexity of Data Reviewed Labs: ordered. Radiology: ordered.  Risk OTC drugs. Prescription drug management.   73 year old patient with pertinent history of diverticulosis, chronic UTI, constipation comes in with chief complaint of abdominal pain,  distention.  She also states that she has been having hot flashes, and that the PCP has been evaluating her for carcinoid tumor.  Differential diagnosis considered for this patient includes UTI, kidney stones, hydronephrosis, bladder spasms, constipation, ileus, bowel spasms, small bowel obstruction, fecal impaction, possibly abdominal or pelvic tumor.  Based on history, there is lower clinical suspicion for UTI.  Will get UA, if it shows clear markers for infection then we will treat  as UTI.  Patient will need CT scan of the abdomen for further morphology identification and to rule out concerning pathology.  12:47 PM CT scan is reassuring. Patient's lab workup is reassuring.  UA does not show any concerns for infection.  I discussed this finding with the patient.  We gave her enema.  Patient did have stool, and it comprised of small, hard balls of stool.  Patient concerned about her sling given that she had some discomfort yesterday in the suprapubic region.  I consulted urology.  They looked at the CT scan, and do not see anything overtly concerning.  The nurse practitioner did advise that patient should follow-up with Dr. Cam, and the urgent follow-up clinic.  This information has been discussed with the patient.  I suspect right now that she is having some spasmatic pain and it is possible that her bladder is also irritated from constipation.  Will focus on constipation management.  She will get mag citrate prescription.  She will take as needed enema at home.      Final diagnoses:  Abdominal cramping  Constipation, unspecified constipation type    ED Discharge Orders          Ordered    magnesium  citrate SOLN   Once        04/07/24 1244    dicyclomine  (BENTYL ) 20 MG tablet  2 times daily        04/07/24 1244    amoxicillin -clavulanate (AUGMENTIN ) 875-125 MG tablet  Every 12 hours        04/07/24 1246               Charlyn Sora, MD 04/07/24 1208    Charlyn Sora, MD 04/07/24 1250

## 2024-04-08 ENCOUNTER — Encounter: Payer: Self-pay | Admitting: Family Medicine

## 2024-04-08 ENCOUNTER — Encounter: Payer: Self-pay | Admitting: Cardiovascular Disease

## 2024-04-10 ENCOUNTER — Other Ambulatory Visit: Payer: Self-pay | Admitting: Family Medicine

## 2024-04-10 DIAGNOSIS — K581 Irritable bowel syndrome with constipation: Secondary | ICD-10-CM

## 2024-04-11 ENCOUNTER — Ambulatory Visit: Payer: Self-pay | Admitting: Cardiovascular Disease

## 2024-04-11 DIAGNOSIS — R002 Palpitations: Secondary | ICD-10-CM | POA: Diagnosis not present

## 2024-04-12 ENCOUNTER — Ambulatory Visit: Payer: Medicare (Managed Care) | Admitting: Radiology

## 2024-04-15 ENCOUNTER — Encounter: Payer: Self-pay | Admitting: Obstetrics and Gynecology

## 2024-04-15 DIAGNOSIS — N898 Other specified noninflammatory disorders of vagina: Secondary | ICD-10-CM

## 2024-04-15 MED ORDER — FLUCONAZOLE 150 MG PO TABS: 150.0000 mg | ORAL_TABLET | Freq: Once | ORAL | 0 refills | Status: AC

## 2024-04-15 NOTE — Telephone Encounter (Signed)
 Patient has been notified

## 2024-04-19 ENCOUNTER — Encounter: Payer: Self-pay | Admitting: Cardiovascular Disease

## 2024-04-19 ENCOUNTER — Other Ambulatory Visit: Payer: Self-pay | Admitting: Pharmacist

## 2024-04-19 DIAGNOSIS — I25119 Atherosclerotic heart disease of native coronary artery with unspecified angina pectoris: Secondary | ICD-10-CM

## 2024-04-19 DIAGNOSIS — E782 Mixed hyperlipidemia: Secondary | ICD-10-CM

## 2024-04-19 NOTE — Telephone Encounter (Signed)
Thx; agree 

## 2024-04-21 ENCOUNTER — Ambulatory Visit: Payer: Self-pay | Admitting: Cardiovascular Disease

## 2024-04-21 LAB — LIPID PANEL
Chol/HDL Ratio: 2.3 ratio (ref 0.0–4.4)
Cholesterol, Total: 113 mg/dL (ref 100–199)
HDL: 50 mg/dL (ref >39)
LDL Chol Calc (NIH): 42 mg/dL (ref 0–99)
Triglycerides: 118 mg/dL (ref 0–149)
VLDL Cholesterol Cal: 21 mg/dL (ref 5–40)

## 2024-04-22 ENCOUNTER — Telehealth: Payer: Medicare (Managed Care) | Admitting: Family Medicine

## 2024-04-22 ENCOUNTER — Other Ambulatory Visit: Payer: Self-pay | Admitting: Neurology

## 2024-04-22 DIAGNOSIS — B3731 Acute candidiasis of vulva and vagina: Secondary | ICD-10-CM | POA: Diagnosis not present

## 2024-04-22 DIAGNOSIS — R399 Unspecified symptoms and signs involving the genitourinary system: Secondary | ICD-10-CM

## 2024-04-22 MED ORDER — CEPHALEXIN 500 MG PO CAPS
500.0000 mg | ORAL_CAPSULE | Freq: Two times a day (BID) | ORAL | 0 refills | Status: AC
Start: 2024-04-22 — End: 2024-04-29

## 2024-04-22 MED ORDER — FLUCONAZOLE 150 MG PO TABS
150.0000 mg | ORAL_TABLET | Freq: Every day | ORAL | 0 refills | Status: AC
Start: 2024-04-22 — End: 2024-04-23

## 2024-04-22 NOTE — Progress Notes (Signed)

## 2024-04-26 ENCOUNTER — Ambulatory Visit: Payer: Medicare (Managed Care) | Admitting: Obstetrics and Gynecology

## 2024-04-26 ENCOUNTER — Encounter: Payer: Self-pay | Admitting: Cardiovascular Disease

## 2024-04-26 ENCOUNTER — Other Ambulatory Visit (HOSPITAL_COMMUNITY)
Admission: RE | Admit: 2024-04-26 | Discharge: 2024-04-26 | Disposition: A | Discharge status: Home / Self Care | Payer: Medicare (Managed Care) | Source: Ambulatory Visit | Attending: Obstetrics and Gynecology | Admitting: Obstetrics and Gynecology

## 2024-04-26 VITALS — BP 147/78 | HR 80

## 2024-04-26 DIAGNOSIS — R319 Hematuria, unspecified: Secondary | ICD-10-CM

## 2024-04-26 DIAGNOSIS — I340 Nonrheumatic mitral (valve) insufficiency: Secondary | ICD-10-CM

## 2024-04-26 DIAGNOSIS — R82998 Other abnormal findings in urine: Secondary | ICD-10-CM | POA: Insufficient documentation

## 2024-04-26 DIAGNOSIS — F419 Anxiety disorder, unspecified: Secondary | ICD-10-CM | POA: Diagnosis not present

## 2024-04-26 DIAGNOSIS — N898 Other specified noninflammatory disorders of vagina: Secondary | ICD-10-CM

## 2024-04-26 DIAGNOSIS — I071 Rheumatic tricuspid insufficiency: Secondary | ICD-10-CM

## 2024-04-26 DIAGNOSIS — I700 Atherosclerosis of aorta: Secondary | ICD-10-CM

## 2024-04-26 DIAGNOSIS — R350 Frequency of micturition: Secondary | ICD-10-CM

## 2024-04-26 DIAGNOSIS — R399 Unspecified symptoms and signs involving the genitourinary system: Secondary | ICD-10-CM

## 2024-04-26 DIAGNOSIS — R232 Flushing: Secondary | ICD-10-CM | POA: Diagnosis not present

## 2024-04-26 DIAGNOSIS — N3289 Other specified disorders of bladder: Secondary | ICD-10-CM

## 2024-04-26 LAB — URINALYSIS, ROUTINE W REFLEX MICROSCOPIC
Bilirubin Urine: NEGATIVE
Glucose, UA: NEGATIVE mg/dL
Ketones, ur: NEGATIVE mg/dL
Nitrite: NEGATIVE
Protein, ur: NEGATIVE mg/dL
Specific Gravity, Urine: 1.011 (ref 1.005–1.030)
pH: 6 (ref 5.0–8.0)

## 2024-04-26 LAB — POCT URINALYSIS DIP (CLINITEK)
Bilirubin, UA: NEGATIVE
Glucose, UA: NEGATIVE mg/dL
Ketones, POC UA: NEGATIVE mg/dL
Nitrite, UA: NEGATIVE
POC PROTEIN,UA: NEGATIVE
Spec Grav, UA: 1.02 (ref 1.010–1.025)
Urobilinogen, UA: 0.2 U/dL
pH, UA: 6 (ref 5.0–8.0)

## 2024-04-26 MED ORDER — BUPIVACAINE HCL 0.25 % IJ SOLN
20.0000 mL | Freq: Once | INTRAMUSCULAR | Status: AC
  Administered 2024-04-26: 20 mL

## 2024-04-26 MED ORDER — HEPARIN SODIUM (PORCINE) 10000 UNIT/ML IJ SOLN
10000.0000 [IU] | Freq: Once | INTRAMUSCULAR | Status: AC
  Administered 2024-04-26: 10000 [IU] via INTRAVESICAL

## 2024-04-26 MED ORDER — HYDROXYZINE HCL 25 MG PO TABS: 25.0000 mg | ORAL_TABLET | Freq: Every evening | ORAL | 5 refills | Status: AC

## 2024-04-26 MED ORDER — LIDOCAINE HCL 2 % IJ SOLN
20.0000 mL | Freq: Once | INTRAMUSCULAR | Status: AC
  Administered 2024-04-26: 400 mg

## 2024-04-26 MED ORDER — PHENAZOPYRIDINE HCL 95 MG PO TABS: 95.0000 mg | ORAL_TABLET | Freq: Three times a day (TID) | ORAL | 1 refills | Status: AC | PRN

## 2024-04-26 MED ORDER — SODIUM BICARBONATE 8.4 % IV SOLN
5.0000 mL | Freq: Once | INTRAVENOUS | Status: AC
  Administered 2024-04-26: 5 mL

## 2024-04-26 NOTE — Progress Notes (Signed)
 [facility] Return Visit  SUBJECTIVE  History of Present Illness: Megan [name] is a 73 y.o. female seen in follow-up for bladder irritation and concern for UTI. Patient also reports she has been seen in the ER and was concerned with UTI, constipation, and now has white spots around her urethra.   Patient is significantly distressed. She states she is having heart palpations, hot flashes, and is not sleeping well. She reports her vaginal tissue feels like it is on fire and she has not been doing her estrogen cream as frequently as she should.   Patient also has a lot of significant anxiety/depression. She reports she has been planning her funeral and recently picked out her grave plot.   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 difficile diarrhea, CAD (coronary artery disease) (02/24/2023), Cancer (HCC), Chest pain, atypical (07/14/2011), Cold sore (07/17/2016), Colon polyp, Contact dermatitis (03/23/2012), Cystocele, midline, Depression, Diarrhea, Diverticulosis, Endometrial hyperplasia (02/27/2006), GERD (gastroesophageal reflux disease), Headache (04/16/2015), Headache, Headache(784.0) (04/15/2012), Heart murmur, Hematuria (04/27/2012), Hepatic cyst, Hiatal hernia, Hiatal hernia, Hyperlipidemia, mixed (12/17/2014), Hypertension, IBS (irritable bowel syndrome), Insomnia, Leaky heart valve, Low back pain (08/15/2013), Osteoporosis (03/2017), Palpitations (08/29/2011), Pancreatic cyst (04/19/2013), Paronychia of great toe, left (06/19/2013), Preventative health care (09/11/2014), Rectal bleeding (10/10/2011), Rectocele, 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   Past Surgical History: She  has a past surgical history that includes Appendectomy; Uterine fibroid surgery; Hysteroscopy; Cardiac catheterization (Left, 07/24/2016); Upper gi endoscopy; endoscopic hemoclip; eye surgery (Left); Open reduction internal fixation (orif) distal radial fracture (Left, 01/17/2019); Robotic assisted laparoscopic sacrocolpopexy (Bilateral, 11/15/2020); Robotic assisted bilateral salpingo oophorectomy (11/15/2020); Robotic assisted supracervical hysterectomy (11/15/2020); and RIGHT HEART CATH (N/A, 01/01/2022).   Medications: She has a current medication list which includes the following prescription(s): albuterol , alprazolam , calcium  & magnesium  carbonates, alum hydroxide-mag carbonate, aspirin  ec, biotin, cephalexin , cholecalciferol, dicyclomine , diltiazem , duloxetine , estradiol , repatha  sureclick, famotidine , fluticasone , gabapentin , hydrocortisone , hydroxyzine , lisinopril , metoprolol  succinate, ondansetron , pantoprazole , phenazopyridine , promethazine , sucralfate , tenapanor hcl, tizanidine , trazodone , vitamin e, and amoxicillin -clavulanate.   Allergies: Patient is allergic to sulfamethoxazole, amitriptyline , atorvastatin , cymbalta  [duloxetine  hcl], ezetimibe , lactose intolerance (gi), lexapro  [escitalopram  oxalate], rosuvastatin , topamax  [topiramate ], valsartan , and prednisone .   Social History: Patient  reports that she quit smoking about 28 years ago. Her smoking use included cigarettes. She has never used smokeless tobacco. She reports that she does not drink alcohol and does not use drugs.     OBJECTIVE     Physical Exam: Vitals:   04/26/24 1054 04/26/24 1056  BP: (!) 172/83 (!) 147/78  Pulse: 80 80   Gen: No apparent distress, A&O x 3.  Detailed Urogynecologic Evaluation:  Patient has a discharge around her  clitoral hood that it thick, white, and chunky in nature. Licocaine applied to the region and the discharge was gently wiped with a cotton tipped applicator to remove the irritation around the clitoris.  Aptima swab obtained. Patient has no mesh erosion noted on speculum exam, no bleeding, other discharge or areas of concern.   Lab Results  Component Value Date   COLORU yellow 04/26/2024   CLARITYU cloudy (A) 04/26/2024   GLUCOSEUR negative 04/26/2024   BILIRUBINUR negative 04/26/2024   KETONESU negative 01/27/2024  SPECGRAV 1.020 04/26/2024   RBCUR small (A) 04/26/2024   PHUR 6.0 04/26/2024   PROTEINUR NEGATIVE 04/07/2024   UROBILINOGEN 0.2 04/26/2024   LEUKOCYTESUR Small (1+) (A) 04/26/2024   Bladder Instillation: The patient was identified and verbally consented for the procedure.  The urethra was prepped with Betadine x 3. A 16 Fr foley catheter was inserted the bladder and drained for 120 cc. The foley was then attached to a 60 mL syringe with the plunger removed.  The medication was slowly poured into the bladder via the syringe and foley.  The medication consisted of: 20ml of Lidocaine  2%, 20mL of Bupivicaine 0.25%, 10,000 units/mL Heparin , 5mL Sodium Bicarbonate  8.4%.  The foley was removed and the patient was asked to hold the liquids in her bladder for 30-60 minutes if possible.     ASSESSMENT AND PLAN    Megan [name] is a 73 y.o. with:  1. Urinary frequency   2. Leukocytes in urine   3. Hematuria, unspecified type   4. Lower urinary tract symptoms (LUTS)   5. Anxiety   6. Hot flashes   7. Vaginal irritation    Patient has had multiple negative urine cultures in the last few months and did a virtual visit in which she got antibioitcs with no culture or POC urine sample done.  Will send urine for culture, but will not treat unless there is a positive culture. I believe her symptoms are stemming more from LUTS/IC that true infection based on her combination of symptoms  with anxiety and depression.  Will send urine for micro. Patient given a bladder instillation today. She tolerated the procedure well. We also discussed starting Hydorxyzine 25mg  at night to decrease nighttime spasms, as this is when it seems to hurt her the most. Patient has also been straining to urinate at times due to frequency. We discussed, again, that straining is the opposite of what we want to do and practiced deep breathing exercises. We also discussed getting a squatty potty to assist in positioning for bowel movements.  Patient's anxiety is not adequately controlled. She reports hot flashes associated with palpitations and has contacted her cardiology team. I attempted to assess SI/HI and she declined having a plan or being suicidal. I do have significant concerns about her anxiety and depression along with her current inhospitable living environment and social isolation.  She saw GYN who encouraged patient to seek other avenues of potential reasons for hot flashes such as anxiety or cardiology.  Aptima swab obtained from under clitoral hood to rule out yeast or BV.  I also instructed patient to use estrogen cream nightly for the next two weeks and then use it twice a week after.   Patient to return in 4 weeks or sooner if needed.   Alvita Fana G Micky Overturf, NP

## 2024-04-26 NOTE — Addendum Note (Signed)
 Addended by: Matheau Orona N on: 04/26/2024 04:29 PM   Modules accepted: Orders

## 2024-04-26 NOTE — Patient Instructions (Addendum)
 Take the Hydroxyzine  at nighttime to see if this helps with the bladder pain.   You can also take the pyridium  for bladder mucosa irritation. This may also help with the bladder spasms.   Please if you are struggling to urinate, I need you not to strain. I want you to breathe through urinating and bowel movements. Take a deep breath in and blow out like you are blowing through a straw.   You need a stool to assist in a bowel movement. Called a Training and development officer. It brings your knees closer to your belly button and puts you in an easier position to poop.

## 2024-04-27 ENCOUNTER — Ambulatory Visit: Payer: Self-pay | Admitting: Obstetrics and Gynecology

## 2024-04-27 ENCOUNTER — Encounter: Payer: Self-pay | Admitting: Obstetrics and Gynecology

## 2024-04-27 LAB — CERVICOVAGINAL ANCILLARY ONLY
Bacterial Vaginitis (gardnerella): NEGATIVE
Candida Glabrata: NEGATIVE
Candida Vaginitis: POSITIVE — AB
Comment: NEGATIVE
Comment: NEGATIVE
Comment: NEGATIVE

## 2024-04-27 LAB — URINE CULTURE: Culture: NO GROWTH

## 2024-04-27 MED ORDER — FLUCONAZOLE 150 MG PO TABS: 150.0000 mg | ORAL_TABLET | Freq: Once | ORAL | 0 refills | Status: AC

## 2024-05-02 ENCOUNTER — Encounter: Payer: Self-pay | Admitting: Cardiovascular Disease

## 2024-05-03 DIAGNOSIS — D490 Neoplasm of unspecified behavior of digestive system: Secondary | ICD-10-CM | POA: Diagnosis not present

## 2024-05-03 DIAGNOSIS — K581 Irritable bowel syndrome with constipation: Secondary | ICD-10-CM | POA: Diagnosis not present

## 2024-05-03 DIAGNOSIS — K449 Diaphragmatic hernia without obstruction or gangrene: Secondary | ICD-10-CM | POA: Diagnosis not present

## 2024-05-03 DIAGNOSIS — K602 Anal fissure, unspecified: Secondary | ICD-10-CM | POA: Diagnosis not present

## 2024-05-03 DIAGNOSIS — R159 Full incontinence of feces: Secondary | ICD-10-CM | POA: Diagnosis not present

## 2024-05-03 DIAGNOSIS — R1319 Other dysphagia: Secondary | ICD-10-CM | POA: Diagnosis not present

## 2024-05-03 DIAGNOSIS — K5904 Chronic idiopathic constipation: Secondary | ICD-10-CM | POA: Diagnosis not present

## 2024-05-03 DIAGNOSIS — K219 Gastro-esophageal reflux disease without esophagitis: Secondary | ICD-10-CM | POA: Diagnosis not present

## 2024-05-03 DIAGNOSIS — R1032 Left lower quadrant pain: Secondary | ICD-10-CM | POA: Diagnosis not present

## 2024-05-03 DIAGNOSIS — R152 Fecal urgency: Secondary | ICD-10-CM | POA: Diagnosis not present

## 2024-05-04 ENCOUNTER — Ambulatory Visit (HOSPITAL_COMMUNITY)
Admission: RE | Admit: 2024-05-04 | Discharge: 2024-05-04 | Disposition: A | Discharge status: Home / Self Care | Payer: Medicare (Managed Care) | Source: Ambulatory Visit | Attending: Cardiovascular Disease | Admitting: Cardiovascular Disease

## 2024-05-04 DIAGNOSIS — I700 Atherosclerosis of aorta: Secondary | ICD-10-CM | POA: Insufficient documentation

## 2024-05-04 DIAGNOSIS — I071 Rheumatic tricuspid insufficiency: Secondary | ICD-10-CM | POA: Insufficient documentation

## 2024-05-04 DIAGNOSIS — I340 Nonrheumatic mitral (valve) insufficiency: Secondary | ICD-10-CM | POA: Insufficient documentation

## 2024-05-04 DIAGNOSIS — I361 Nonrheumatic tricuspid (valve) insufficiency: Secondary | ICD-10-CM

## 2024-05-05 LAB — ECHOCARDIOGRAM COMPLETE
Area-P 1/2: 3.85 cm2
S' Lateral: 2.44 cm

## 2024-05-06 ENCOUNTER — Ambulatory Visit: Payer: Self-pay | Admitting: Cardiovascular Disease

## 2024-05-18 ENCOUNTER — Encounter: Payer: Self-pay | Admitting: Cardiovascular Disease

## 2024-05-22 ENCOUNTER — Encounter: Payer: Self-pay | Admitting: Obstetrics and Gynecology

## 2024-05-23 ENCOUNTER — Other Ambulatory Visit: Payer: Self-pay

## 2024-05-23 MED ORDER — TROSPIUM CHLORIDE 20 MG PO TABS: 20.0000 mg | ORAL_TABLET | Freq: Two times a day (BID) | ORAL | 1 refills | Status: AC

## 2024-05-24 MED ORDER — METOPROLOL SUCCINATE ER 50 MG PO TB24: 50.0000 mg | ORAL_TABLET | Freq: Every day | ORAL | 3 refills | Status: AC

## 2024-05-24 NOTE — Progress Notes (Signed)
 Hydroxyzine  was denied.;CaseId:[id number];Status:;Appeal Information: Attention:ATTN: MEDICARE CLINICAL APPEALS [facility] [phone] WebAddress:[email or web address]

## 2024-05-26 ENCOUNTER — Encounter: Payer: Self-pay | Admitting: Cardiovascular Disease

## 2024-05-26 ENCOUNTER — Telehealth: Payer: Self-pay

## 2024-05-26 DIAGNOSIS — I700 Atherosclerosis of aorta: Secondary | ICD-10-CM

## 2024-05-26 DIAGNOSIS — E782 Mixed hyperlipidemia: Secondary | ICD-10-CM

## 2024-05-26 DIAGNOSIS — I25119 Atherosclerotic heart disease of native coronary artery with unspecified angina pectoris: Secondary | ICD-10-CM

## 2024-05-27 ENCOUNTER — Other Ambulatory Visit: Payer: Self-pay | Admitting: Pharmacist

## 2024-05-27 DIAGNOSIS — I700 Atherosclerosis of aorta: Secondary | ICD-10-CM

## 2024-05-27 DIAGNOSIS — I25119 Atherosclerotic heart disease of native coronary artery with unspecified angina pectoris: Secondary | ICD-10-CM

## 2024-05-27 DIAGNOSIS — E782 Mixed hyperlipidemia: Secondary | ICD-10-CM

## 2024-05-27 MED ORDER — REPATHA SURECLICK 140 MG/ML ~~LOC~~ SOAJ: 140.0000 mg | SUBCUTANEOUS | 1 refills | Status: DC

## 2024-05-27 MED ORDER — REPATHA SURECLICK 140 MG/ML ~~LOC~~ SOAJ: 140.0000 mg | SUBCUTANEOUS | 3 refills | Status: AC

## 2024-06-01 ENCOUNTER — Ambulatory Visit: Payer: Medicare (Managed Care) | Admitting: Family Medicine

## 2024-06-07 ENCOUNTER — Encounter: Payer: Self-pay | Admitting: Family Medicine

## 2024-06-13 ENCOUNTER — Ambulatory Visit: Payer: Medicare (Managed Care)

## 2024-06-13 ENCOUNTER — Ambulatory Visit: Payer: Medicare (Managed Care) | Admitting: Obstetrics and Gynecology

## 2024-06-14 ENCOUNTER — Other Ambulatory Visit: Payer: Self-pay | Admitting: Family Medicine

## 2024-06-14 DIAGNOSIS — J4520 Mild intermittent asthma, uncomplicated: Secondary | ICD-10-CM

## 2024-06-14 DIAGNOSIS — K921 Melena: Secondary | ICD-10-CM | POA: Diagnosis not present

## 2024-06-16 ENCOUNTER — Encounter: Payer: Self-pay | Admitting: Family Medicine

## 2024-06-16 ENCOUNTER — Ambulatory Visit: Payer: Medicare (Managed Care) | Admitting: Family Medicine

## 2024-06-16 VITALS — BP 138/76 | HR 77 | Temp 97.1°F | Ht 66.0 in | Wt 154.0 lb

## 2024-06-16 DIAGNOSIS — R1319 Other dysphagia: Secondary | ICD-10-CM

## 2024-06-16 DIAGNOSIS — F418 Other specified anxiety disorders: Secondary | ICD-10-CM

## 2024-06-16 DIAGNOSIS — J301 Allergic rhinitis due to pollen: Secondary | ICD-10-CM | POA: Diagnosis not present

## 2024-06-16 DIAGNOSIS — J3489 Other specified disorders of nose and nasal sinuses: Secondary | ICD-10-CM | POA: Diagnosis not present

## 2024-06-16 DIAGNOSIS — I10 Essential (primary) hypertension: Secondary | ICD-10-CM | POA: Diagnosis not present

## 2024-06-16 DIAGNOSIS — K047 Periapical abscess without sinus: Secondary | ICD-10-CM

## 2024-06-16 DIAGNOSIS — F450 Somatization disorder: Secondary | ICD-10-CM | POA: Insufficient documentation

## 2024-06-16 DIAGNOSIS — R339 Retention of urine, unspecified: Secondary | ICD-10-CM | POA: Diagnosis not present

## 2024-06-16 MED ORDER — FLUTICASONE PROPIONATE 50 MCG/ACT NA SUSP
2.0000 | Freq: Every day | NASAL | 6 refills | Status: DC
Start: 2024-06-16 — End: 2024-06-17
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
## 2024-06-16 NOTE — Telephone Encounter (Signed)
 Sent the Flonase  into the pharmacy for patient. Please review the other part of patients message and advise.  Thanks. Dm/cma

## 2024-06-16 NOTE — Telephone Encounter (Signed)
 Copied from CRM [id number]. Topic: Clinical - Prescription Issue >> Jun 16, 2024 12:32 PM Dedra B wrote: Reason for CRM: Pt was seen in office this morning and was supposed to have Flonase  sent to [facility]. She checked with the pharmacy, and they don't have it.

## 2024-06-17 ENCOUNTER — Telehealth: Payer: Medicare (Managed Care) | Admitting: Physician Assistant

## 2024-06-17 ENCOUNTER — Encounter: Payer: Self-pay | Admitting: Family Medicine

## 2024-06-17 DIAGNOSIS — B9689 Other specified bacterial agents as the cause of diseases classified elsewhere: Secondary | ICD-10-CM

## 2024-06-17 DIAGNOSIS — J3489 Other specified disorders of nose and nasal sinuses: Secondary | ICD-10-CM | POA: Insufficient documentation

## 2024-06-17 DIAGNOSIS — C7A010 Malignant carcinoid tumor of the duodenum: Secondary | ICD-10-CM | POA: Insufficient documentation

## 2024-06-17 MED ORDER — FLUTICASONE PROPIONATE 50 MCG/ACT NA SUSP: 2.0000 | Freq: Every day | NASAL | 6 refills | Status: AC

## 2024-06-17 MED ORDER — LIDOCAINE VISCOUS HCL 2 % MT SOLN: 5.0000 mL | Freq: Three times a day (TID) | OROMUCOSAL | 0 refills | Status: DC | PRN

## 2024-06-17 NOTE — Assessment & Plan Note (Signed)
 Strongly advised her to stop regular use of Afrin. I will have her resume use of fluticasone  spray. This may help her sinus pressure and better control her allergies.

## 2024-06-17 NOTE — Telephone Encounter (Signed)
 Copied from CRM [id number]. Topic: Clinical - Medical Advice >> Jun 17, 2024  2:26 PM Rea ORN wrote: Reason for CRM: pt was seen by PCP yesterday and she sent Mychart message that she is getting worst. Pt would like abx for sinus infection sent to [id number] [place] - [address] [place]) [place] [address]  Please call back to advise once rx is sent [phone]

## 2024-06-17 NOTE — Telephone Encounter (Signed)
 I strongly recommend we obtain the sinus CT first before considering any further antibiotics.

## 2024-06-17 NOTE — Assessment & Plan Note (Signed)
 Megan [name] complains of symptoms of urinary retention. She has had prior hysterectomy with sacrocolpopexy. To try and facilitate her care with urogynecology, I will order a pelvic ultrasound to assess for any sign of significant urinary retention or bladder issues that could cause this.

## 2024-06-17 NOTE — Telephone Encounter (Signed)
 Patient notified VIA phone that she will need to wait till we get the CT done.  Dm/cma

## 2024-06-17 NOTE — Telephone Encounter (Signed)
 Pt was seen yesterday but her symptoms are not improving patient req rx be sent in for her sinuses. Please advise

## 2024-06-17 NOTE — Assessment & Plan Note (Signed)
 Ms. Maberry's mood issues are not well controlled. I am concerned that this exhibits as frequent physical symptoms or magnification of her experience of these symptoms. She will continue duloxetine  30 mg daily. I will try to reinstitute more frequent visits to allow discussion of stressors and how she is managing these. We did discuss her trying to resume walking on a regular basis.

## 2024-06-17 NOTE — Assessment & Plan Note (Signed)
 Megan [name] has had ongoing complaints of left sinus pressure and pain. She has been on multiple courses of antibiotics over the past 6-9 months. It is not clear that he current complaints represent actual sinus disease vs. her experience of symptoms. I recommend we do a sinus CT scan to assess this further.

## 2024-06-17 NOTE — Progress Notes (Signed)
  Because of your symptoms and recurrent sinus infections, I feel your condition warrants further evaluation and I recommend that you be seen in a face-to-face visit.   NOTE: There will be NO CHARGE for this E-Visit   If you are having a true medical emergency, please call 911.     For an urgent face to face visit, [place] has multiple urgent care centers for your convenience.  Click the link below for the full list of locations and hours, walk-in wait times, appointment scheduling options and driving directions:  Urgent Care - [place], [place], [place], [place], [place], [place]  [place]     Your MyChart E-visit questionnaire answers were reviewed by a board certified advanced clinical practitioner to complete your personal care plan based on your specific symptoms.    Thank you for using e-Visits.

## 2024-06-17 NOTE — Assessment & Plan Note (Signed)
 BP at acceptable today. Continue diltiazem  CD 180 mg daily, metoprolol  succinate 50 mg daily, and lisinopril  5 mg daily.

## 2024-06-21 ENCOUNTER — Ambulatory Visit
Admission: RE | Admit: 2024-06-21 | Discharge: 2024-06-21 | Disposition: A | Discharge status: Home / Self Care | Payer: Medicare (Managed Care) | Source: Ambulatory Visit | Attending: Family Medicine | Admitting: Family Medicine

## 2024-06-21 DIAGNOSIS — R339 Retention of urine, unspecified: Secondary | ICD-10-CM | POA: Diagnosis not present

## 2024-06-22 ENCOUNTER — Ambulatory Visit: Payer: Self-pay | Admitting: Family Medicine

## 2024-06-22 ENCOUNTER — Ambulatory Visit
Admission: RE | Admit: 2024-06-22 | Discharge: 2024-06-22 | Disposition: A | Discharge status: Home / Self Care | Payer: Medicare (Managed Care) | Source: Ambulatory Visit | Attending: Family Medicine | Admitting: Family Medicine

## 2024-06-22 DIAGNOSIS — J342 Deviated nasal septum: Secondary | ICD-10-CM

## 2024-06-22 DIAGNOSIS — J3489 Other specified disorders of nose and nasal sinuses: Secondary | ICD-10-CM

## 2024-06-22 DIAGNOSIS — J329 Chronic sinusitis, unspecified: Secondary | ICD-10-CM

## 2024-06-23 ENCOUNTER — Encounter: Payer: Self-pay | Admitting: Family Medicine

## 2024-06-26 ENCOUNTER — Telehealth: Payer: Medicare (Managed Care) | Admitting: Physician Assistant

## 2024-06-26 DIAGNOSIS — K0889 Other specified disorders of teeth and supporting structures: Secondary | ICD-10-CM

## 2024-06-26 NOTE — Progress Notes (Signed)
  Because of the severity of symtpoms, I feel your condition warrants further evaluation and I recommend that you be seen in a face-to-face visit.   NOTE: There will be NO CHARGE for this E-Visit   If you are having a true medical emergency, please call 911.     For an urgent face to face visit, [place] has multiple urgent care centers for your convenience.  Click the link below for the full list of locations and hours, walk-in wait times, appointment scheduling options and driving directions:  Urgent Care - [place], [place], [place], [place], [place], [place]  [place]     Your MyChart E-visit questionnaire answers were reviewed by a board certified advanced clinical practitioner to complete your personal care plan based on your specific symptoms.    Thank you for using e-Visits.

## 2024-06-27 ENCOUNTER — Other Ambulatory Visit: Payer: Self-pay | Admitting: Family Medicine

## 2024-06-27 DIAGNOSIS — K581 Irritable bowel syndrome with constipation: Secondary | ICD-10-CM

## 2024-06-28 ENCOUNTER — Telehealth: Payer: Self-pay | Admitting: Pharmacist

## 2024-06-28 NOTE — Telephone Encounter (Signed)
 Pharmacy Patient Advocate Encounter  Received notification from [employer] that Prior Authorization for Nurtec 75MG  dispersible tablets  has been APPROVED from 05/29/2024 to 06/27/2025   PA #/Case ID/Reference #: [id number]

## 2024-06-28 NOTE — Telephone Encounter (Signed)
 Pharmacy Patient Advocate Encounter   Received notification from Patient Pharmacy that prior authorization for Nurtec 75MG  dispersible tablets is required/requested.   Insurance verification completed.   The patient is insured through [employer].   Per test claim: PA required; PA submitted to above mentioned insurance via Latent Key/confirmation #/EOC [id number] Status is pending

## 2024-06-29 ENCOUNTER — Other Ambulatory Visit: Payer: Self-pay | Admitting: Family Medicine

## 2024-06-29 DIAGNOSIS — F418 Other specified anxiety disorders: Secondary | ICD-10-CM

## 2024-06-29 NOTE — Telephone Encounter (Signed)
 Refill request for \\Alprazolam  1 mg LR  04/08/24, #45, 2 rf LOV  06/16/24 FOV 08/16/24  Please review and advise.  Thanks. Dm/cma

## 2024-07-05 ENCOUNTER — Other Ambulatory Visit: Payer: Self-pay | Admitting: Family Medicine

## 2024-07-05 DIAGNOSIS — J4520 Mild intermittent asthma, uncomplicated: Secondary | ICD-10-CM

## 2024-07-05 DIAGNOSIS — K581 Irritable bowel syndrome with constipation: Secondary | ICD-10-CM

## 2024-07-13 ENCOUNTER — Encounter (INDEPENDENT_AMBULATORY_CARE_PROVIDER_SITE_OTHER): Payer: Self-pay

## 2024-07-13 ENCOUNTER — Telehealth: Payer: Self-pay

## 2024-07-13 NOTE — Telephone Encounter (Signed)
 Patient called and left a voicemail stating she has left lower back pain. Patient states she woke up around 4 am to urinate which went every where, she thinks it could be her kidneys.

## 2024-07-18 ENCOUNTER — Telehealth: Payer: Self-pay

## 2024-07-18 NOTE — Telephone Encounter (Signed)
 Spoke to patient, she will let her OB-GYN check it out when she sees them or if she wants to come to see us  sooner than 08/16/24 she will call back to schedule an appointment.  Dm/cma

## 2024-07-18 NOTE — Telephone Encounter (Signed)
 Copied from CRM #[id number]. Topic: General - Other >> Jul 18, 2024  3:19 PM Thersia C wrote: Reason for RMF:Ejupzwu called in stated she has called  [facility] And they stated she needs a order put in, Patient would like for Dr.Rudd to put in order

## 2024-07-19 ENCOUNTER — Ambulatory Visit: Payer: Medicare (Managed Care) | Admitting: Family Medicine

## 2024-07-19 ENCOUNTER — Other Ambulatory Visit: Payer: Self-pay | Admitting: Family Medicine

## 2024-07-19 DIAGNOSIS — K581 Irritable bowel syndrome with constipation: Secondary | ICD-10-CM

## 2024-07-22 ENCOUNTER — Other Ambulatory Visit: Payer: Self-pay | Admitting: Neurology

## 2024-07-28 ENCOUNTER — Telehealth: Payer: Medicare (Managed Care) | Admitting: Physician Assistant

## 2024-07-28 DIAGNOSIS — R3989 Other symptoms and signs involving the genitourinary system: Secondary | ICD-10-CM | POA: Diagnosis not present

## 2024-07-28 MED ORDER — CEPHALEXIN 500 MG PO CAPS: 500.0000 mg | ORAL_CAPSULE | Freq: Two times a day (BID) | ORAL | 0 refills | Status: DC

## 2024-07-28 NOTE — Progress Notes (Signed)

## 2024-07-29 ENCOUNTER — Encounter (INDEPENDENT_AMBULATORY_CARE_PROVIDER_SITE_OTHER): Payer: Self-pay

## 2024-08-06 ENCOUNTER — Telehealth: Payer: Medicare (Managed Care) | Admitting: Family Medicine

## 2024-08-06 DIAGNOSIS — K5790 Diverticulosis of intestine, part unspecified, without perforation or abscess without bleeding: Secondary | ICD-10-CM

## 2024-08-06 DIAGNOSIS — K5792 Diverticulitis of intestine, part unspecified, without perforation or abscess without bleeding: Secondary | ICD-10-CM

## 2024-08-06 NOTE — Progress Notes (Signed)
" ° °  Thank you for the details you included in the comment boxes. Those details are very helpful in determining the best course of treatment for you and help us  to provide the best care.Because M. Estelle, we recommend that you schedule a Virtual Urgent Care video visit in order for the provider to better assess what is going on.  The provider will be able to give you a more accurate diagnosis and treatment plan if we can more freely discuss your symptoms and with the addition of a virtual examination.   If you change your visit to a video visit, we will bill your insurance (similar to an office visit) and you will not be charged for this e-Visit. You will be able to stay at home and speak with the first available [facility] advanced practice provider. The link to do a video visit is in the drop down Menu tab of your Welcome screen in MyChart.    "

## 2024-08-07 ENCOUNTER — Telehealth: Payer: Medicare (Managed Care) | Admitting: Physician Assistant

## 2024-08-07 DIAGNOSIS — H9209 Otalgia, unspecified ear: Secondary | ICD-10-CM

## 2024-08-07 NOTE — Progress Notes (Signed)
" °  Because of multiple e-visits for different infections in the past week, including yesterday for concerns of diverticulitis, I feel your condition warrants further evaluation and I recommend that you be seen in a face-to-face visit.   NOTE: There will be NO CHARGE for this E-Visit   If you are having a true medical emergency, please call 911.     For an urgent face to face visit, [place] has multiple urgent care centers for your convenience.  Click the link below for the full list of locations and hours, walk-in wait times, appointment scheduling options and driving directions:  Urgent Care - [place], [place], [place], [place], [place], [place]  [place]     Your MyChart E-visit questionnaire answers were reviewed by a board certified advanced clinical practitioner to complete your personal care plan based on your specific symptoms.    Thank you for using e-Visits.    "

## 2024-08-10 ENCOUNTER — Telehealth: Payer: Self-pay

## 2024-08-10 NOTE — Progress Notes (Signed)
" ° °  08/10/2024  Patient ID: Megan [name], female   DOB: [birth year], 73 y.o.   MRN: [id number]  This patient is appearing on a report for being at risk of failing the adherence measure for hypertension (ACEi/ARB) medications this calendar year.   Medication: lisinopril  5mg  Last fill date: 06/27/24 for 90 day supply  Insurance report was not up to date. No action needed at this time.   Channing DELENA Mealing, PharmD, DPLA   "

## 2024-08-13 ENCOUNTER — Other Ambulatory Visit: Payer: Self-pay | Admitting: Obstetrics and Gynecology

## 2024-08-14 ENCOUNTER — Other Ambulatory Visit: Payer: Self-pay | Admitting: Obstetrics and Gynecology

## 2024-08-15 ENCOUNTER — Other Ambulatory Visit: Payer: Self-pay | Admitting: Obstetrics and Gynecology

## 2024-08-16 ENCOUNTER — Ambulatory Visit: Payer: Medicare (Managed Care) | Admitting: Family Medicine

## 2024-08-16 ENCOUNTER — Ambulatory Visit: Payer: Self-pay | Admitting: Family Medicine

## 2024-08-16 ENCOUNTER — Encounter: Payer: Self-pay | Admitting: Family Medicine

## 2024-08-16 VITALS — BP 136/84 | HR 74 | Temp 97.9°F | Ht 66.0 in | Wt 157.8 lb

## 2024-08-16 DIAGNOSIS — F331 Major depressive disorder, recurrent, moderate: Secondary | ICD-10-CM | POA: Diagnosis not present

## 2024-08-16 DIAGNOSIS — K148 Other diseases of tongue: Secondary | ICD-10-CM

## 2024-08-16 DIAGNOSIS — I10 Essential (primary) hypertension: Secondary | ICD-10-CM

## 2024-08-16 LAB — TSH: TSH: 1.41 u[IU]/mL (ref 0.35–5.50)

## 2024-08-16 NOTE — Assessment & Plan Note (Signed)
 Megan [name]'s depression and anxiety are not effectively managed. She is currently taking duloxetine  30 mg daily. She is not willing to engage in counseling. She has had multiple intolerances to psychotherapeutics. I also suspect a degree of somatization related to many of her physical complaints, as testing has not been able to demonstrate organic disease as a cause.
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
## 2024-08-16 NOTE — Assessment & Plan Note (Addendum)
 BP is in acceptable control today. Continue diltiazem  CD 180 mg daily, metoprolol  succinate 50 mg daily, and lisinopril  5 mg daily.

## 2024-08-22 ENCOUNTER — Encounter (INDEPENDENT_AMBULATORY_CARE_PROVIDER_SITE_OTHER): Payer: Self-pay

## 2024-08-22 ENCOUNTER — Institutional Professional Consult (permissible substitution) (INDEPENDENT_AMBULATORY_CARE_PROVIDER_SITE_OTHER): Payer: Medicare (Managed Care)

## 2024-08-22 ENCOUNTER — Other Ambulatory Visit: Payer: Self-pay | Admitting: Family Medicine

## 2024-08-22 VITALS — BP 147/82 | HR 72 | Ht 66.0 in | Wt 157.8 lb

## 2024-08-22 DIAGNOSIS — F418 Other specified anxiety disorders: Secondary | ICD-10-CM

## 2024-08-22 DIAGNOSIS — K117 Disturbances of salivary secretion: Secondary | ICD-10-CM | POA: Diagnosis not present

## 2024-08-22 DIAGNOSIS — H9192 Unspecified hearing loss, left ear: Secondary | ICD-10-CM | POA: Diagnosis not present

## 2024-08-22 DIAGNOSIS — G501 Atypical facial pain: Secondary | ICD-10-CM

## 2024-08-22 DIAGNOSIS — J342 Deviated nasal septum: Secondary | ICD-10-CM

## 2024-08-22 MED ORDER — PILOCARPINE HCL 5 MG PO TABS: 5.0000 mg | ORAL_TABLET | Freq: Two times a day (BID) | ORAL | 0 refills | Status: AC

## 2024-08-22 NOTE — Progress Notes (Signed)
 Dear Dr. Thedora, Here is my assessment for our mutual patient, Megan [name]. Thank you for allowing me the opportunity to care for your patient. Please do not hesitate to contact me should you have any other questions. Sincerely, Dr. Hadassah Parody  Otolaryngology Clinic Note Referring provider: Dr. Thedora HPI:   Initial HPI (08/22/2024) 74 year old female with chronic left-sided facial pain who presents for evaluation of persistent unilateral pain and associated symptoms.  Wondering if this could be due to her sinuses.  She underwent a recent sinus CT scan which showed clear sinuses but she does have a rightward nasal septal deviation.  She has a chronic left-sided facial pain and neck pain for many years, characterized by a throbbing on her face and tearing of her eye.  This occurs multiple times a day and is extremely severe.  Pain is exacerbated at night requiring frequent repositioning for comfort.  Reports what she thinks are frequent sinus infections although her only symptom during these periods is facial pain. She does have a deviated septum but reports no nasal obstruction.  Reports that previous ENT and neurology evaluations have not established a definitive diagnosis. She was started gabapentin  at 1 time and this seemed to help somewhat but she continues to have daily severe headache. She is currently an established patient with [facility] and was told to follow-up as needed with their office.  Patient is also reporting left-sided hearing loss and otalgia.  Left-sided hearing loss for 1 to 2 years.  Reporting constant aural pressure similar to altitude changes.  Valsalva maneuver provides only temporary relief.  She has had her hearing tested in the past and was told she could benefit from a hearing aid however she declined this.  Hearing loss is progressively bothersome to her.  Also reporting xerostomia.  Uses lozenges and this helps only minimally.  She has not  tried Salagen .  Feels like her tongue is adhering to the oral mucosa.  Requires constant water  intake without any relief.    Independent Review of Additional Tests or Records:  CT sinus 06/22/2024 independently reviewed showing clear sinuses throughout with the exception of small left maxillary mucous retention cyst.  Bilateral inferior turbinate hypertrophy.  Rightward septal deviation.    PMH/Meds/All/SocHx/FamHx/ROS:   Past Medical History:  Diagnosis Date   Adrenal adenoma 05/22/2014   Adrenal gland cyst 05/22/2014   Allergic state 12/31/2012   Anxiety    Asthma    triggerred by fragrances    Barrett esophagus    Blind loop syndrome    C. difficile diarrhea    CAD (coronary artery disease) 02/24/2023   Nonobstructive CAD >> CCTA 12/26/2021: CAC score 115 (72nd percentile); RCA mid and distal <25, LAD proximal and mid 25-49, mid-distal 50-69, LCx proximal 50-69, ascending aorta 33 mm; FFR normal-medical therapy   Cancer (HCC)    pt denies    Chest pain, atypical 07/14/2011   Cold sore 07/17/2016   Colon polyp    Contact dermatitis 03/23/2012   Cystocele, midline    Depression    Diarrhea    Diverticulosis    Endometrial hyperplasia 02/27/2006   BENIGN ENDO BX ON 02/2007   GERD (gastroesophageal reflux disease)    Headache 04/16/2015   Headache    Headache(784.0) 04/15/2012   pt denies    Heart murmur    Hematuria 04/27/2012   Hepatic cyst    Hiatal hernia    Hiatal hernia    Hyperlipidemia, mixed 12/17/2014   Hypertension  IBS (irritable bowel syndrome)    Insomnia    Leaky heart valve    Low back pain 08/15/2013   Osteoporosis 03/2017   T score -2.5. 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   Sinusitis acute 02/03/2012   Thrush 07/21/2011   Tricuspid regurgitation, Mild-Moderate 07/21/2011   Moderate by 2 d echocardiogram Echo 11/2021: EF 60-65, no RWMA, normal RVSF, moderately elevated PASP, RVSP 52.1, trivial MR, mild to moderate TR   Unspecified hypothyroidism    Unspecified menopausal and postmenopausal disorder    Uterine prolapse without mention of vaginal wall prolapse    Vitamin B12 deficiency    Vitamin D  deficiency 07/17/2016     Past Surgical History:  Procedure Laterality Date   APPENDECTOMY     endoscopic hemoclip     eye surgery Left    lens implant   HYSTEROSCOPY     POLYP   OPEN REDUCTION INTERNAL FIXATION (ORIF) DISTAL RADIAL FRACTURE Left 01/17/2019   Procedure: LEFT OPEN REDUCTION INTERNAL FIXATION (ORIF) DISTAL RADIUS FRACTURE;  Surgeon: Murrell Kuba, MD;  Location: [facility];  Service: Orthopedics;  Laterality: Left;  AXILLARY BLOCK   PERIPHERAL VASCULAR CATHETERIZATION Left 07/24/2016   Procedure: Renal Angiography;  Surgeon: Redell LITTIE Door, MD;  Location: [facility] INVASIVE CV LAB;  Service: Cardiovascular;  Laterality: Left;   RIGHT HEART CATH N/A 01/01/2022   Procedure: RIGHT HEART CATH;  Surgeon: Wonda Sharper, MD;  Location: [facility] INVASIVE CV LAB;  Service: Cardiovascular;  Laterality: N/A;   ROBOTIC ASSISTED BILATERAL SALPINGO OOPHERECTOMY  11/15/2020   ROBOTIC ASSISTED LAPAROSCOPIC SACROCOLPOPEXY Bilateral 11/15/2020   Procedure: XI ROBOTIC ASSISTED LAPAROSCOPIC SACROCOLPOPEXY AND SUPRACERVICIAL HYSTERECTOMY BILATERAL SALPINGO-OOPHERECTOMY;  Surgeon: Cam Morene ORN, MD;  Location: [facility];  Service: Urology;  Laterality: Bilateral;  REQUESTING 4.5 HRS   ROBOTIC ASSISTED SUPRACERVICAL HYSTERECTOMY  11/15/2020   UPPER GI ENDOSCOPY     UTERINE FIBROID SURGERY      Family History  Problem Relation Age of Onset   Colon cancer Mother    Diabetes Mother    Hypertension Mother    Colon  polyps Father    Parkinson's disease Father    Colon cancer Maternal Grandmother    Breast cancer Maternal Grandmother 57   Diabetes Paternal Grandmother    Breast cancer Paternal Grandmother 65   Allergies Sister    Arthritis Brother        b/l hip replace   Alcohol abuse Daughter    Arthritis Son    Hypertension Son    Allergies Son    Osteoporosis Son    Osteoporosis Son    Arthritis Son        back disease, DDD   Allergies Son    Arthritis Son    Irritable bowel syndrome Son        RSD   Hypertension Paternal Grandfather    Heart disease Paternal Grandfather    Aneurysm Paternal Grandfather    Colon cancer Maternal Aunt    Colon cancer Cousin        X3   Ovarian cancer Cousin      Social Connections: Socially Isolated (06/15/2024)   Social Connection  and Isolation Panel    Frequency of Communication with Friends and Family: Twice a week    Frequency of Social Gatherings with Friends and Family: Never    Attends Religious Services: Never    Database Administrator or Organizations: No    Attends Engineer, Structural: Not on file    Marital Status: Married     Current Outpatient Medications  Medication Instructions   albuterol  (VENTOLIN  HFA) 108 (90 Base) MCG/ACT inhaler INHALE 2 PUFFS BY MOUTH EVERY 6 HOURS AS NEEDED FOR WHEEZING OR SHORTNESS OF BREATH   ALPRAZolam  (XANAX ) 1 MG tablet TAKE 1 TABLET(1 MG) BY MOUTH TWICE DAILY AS NEEDED FOR ANXIETY   Alum & Mag Hydroxide-Simeth (MYLANTA PO) Take by mouth. PRN   Alum Hydroxide-Mag Carbonate (GAVISCON PO) Take by mouth. PRN   aspirin  EC 81 mg, Daily   Biotin 10 MG TABS Take by mouth.   cholecalciferol (VITAMIN D3) 5,000 Units, Daily   cyclobenzaprine  (FLEXERIL ) 5 mg, 3 times daily PRN   dicyclomine  (BENTYL ) 20 mg, Oral, 2 times daily   diltiazem  (CARDIZEM  CD) 180 mg, Oral, Daily   DULoxetine  (CYMBALTA ) 30 mg, Oral, Daily   estradiol  (ESTRACE ) 0.5 g, Vaginal, 2 times weekly, Place 0.5g nightly for two weeks  then twice a week after   famotidine  (PEPCID ) 40 mg, Daily   fluticasone  (FLONASE ) 50 MCG/ACT nasal spray 2 sprays, Each Nare, Daily   gabapentin  (NEURONTIN ) 300 MG capsule Take 1 in the morning, take 2 at night   hydrocortisone  (ANUSOL -HC) 2.5 % rectal cream 1 Application, Rectal, 2 times daily   hydrOXYzine  (ATARAX ) 25 mg, Oral, Nightly   linaclotide  (LINZESS ) 290 mcg, Daily before breakfast   lisinopril  (ZESTRIL ) 5 mg, Oral, Daily   metoprolol  succinate (TOPROL -XL) 50 mg, Oral, Daily   ondansetron  (ZOFRAN ) 4 MG tablet TAKE 1 TABLET BY MOUTH EVERY 8 HOURS AS NEEDED FOR NAUSEA AND VOMITING   pantoprazole  (PROTONIX ) 40 mg, Oral, Daily   phenazopyridine  (PYRIDIUM ) 95 mg, Oral, 3 times daily PRN   pilocarpine  (SALAGEN ) 5 mg, Oral, 2 times daily   promethazine  (PHENERGAN ) 25 mg, Oral, Every 6 hours PRN   Prucalopride Succinate 2 MG TABS 1 tablet, Daily   Repatha  SureClick 140 mg, Subcutaneous, Every 14 days   sucralfate  (CARAFATE ) 1 g, 4 times daily   tiZANidine  (ZANAFLEX ) 4 MG tablet TAKE 1 TABLET(4 MG) BY MOUTH TWICE DAILY AS NEEDED FOR MUSCLE SPASMS   traZODone  (DESYREL ) 25-50 mg, Oral, At bedtime PRN, for sleep   trospium  (SANCTURA ) 20 mg, Oral, 2 times daily   Vitamin E (VITAMIN E/D-ALPHA NATURAL) 268 MG (400 UNIT) CAPS Take by mouth.     Physical Exam:   BP (!) 147/82 (BP Location: Right Arm, Patient Position: Sitting)   Pulse 72   Ht 5' 6 (1.676 m)   Wt 157 lb 12.8 oz (71.6 kg)   SpO2 97%   BMI 25.47 kg/m   Salient findings:  CN II-XII intact with the exception of CNVIII as above   Bilateral EAC clear and TM intact with well pneumatized middle ear spaces  Anterior rhinoscopy: Septum rightward deviated; bilateral inferior turbinates with hypertrophy  Nasal endoscopy was indicated to better evaluate the nose and paranasal sinuses, given the patient's history and exam findings, and is detailed below.  No lesions of oral cavity/oropharynx  No obviously palpable neck  masses/lymphadenopathy/thyromegaly  No respiratory distress or stridor  Seprately Identifiable Procedures:  Prior to initiating any procedures, risks/benefits/alternatives were explained to the patient  and verbal consent obtained.  PROCEDURE (08/22/2024): Bilateral Diagnostic Rigid Nasal Endoscopy Pre-procedure diagnosis: Concern for chronic sinusitis  Post-procedure diagnosis: same, atypical facial pain Indication: See pre-procedure diagnosis and physical exam above Complications: None apparent EBL: 0 mL Anesthesia: Lidocaine  4% and topical decongestant was topically sprayed in each nasal cavity  Description of Procedure:  Patient was identified. A rigid 30 degree endoscope was utilized to evaluate the sinonasal cavities, mucosa, sinus ostia and turbinates and septum.  Overall, signs of mucosal inflammation are not noted.  No mucopurulence, polyps, or masses noted.   Right Middle meatus: clear Right SE Recess: clear Left MM: clear Left SE Recess: clear Photodocumentation was obtained.  CPT CODE -- 68768 - Mod 25   Impression & Plans:  Megan [name] is a 74 y.o. female with   1. Hearing loss of left ear, unspecified hearing loss type   2. Atypical facial pain   3. 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 Sinus CT was unremarkable, and symptoms do not suggest sinusitis. Prior neurology evaluation she reports was not revealing. Gabapentin  and cervical injections have provided partial relief, but pain persists and significantly impairs her quality of life.  She is interested in a second opinion. - Referred to [facility] for second opinion and further evaluation of atypical facial pain and migraine. - Provided education regarding the likely the absence of sinus pathology on imaging.  Provided  education on her sinus symptoms of facial pain without any other sinus symptoms and with a clear CT scan indicates this pain is not due to recurrent sinus infections.   Nasal septal deviation - Rightward nasal septal deviation on CT scan.  No nasal obstruction at this time.  Continue to monitor and call if she begins to notice worsening nasal obstruction.  Left-sided hearing loss She has chronic left-sided hearing loss with intermittent aural pressure and pain. Otoscopic examination was unremarkable. Hearing loss significantly affects daily function. Symptoms are not attributed to sinus disease.  Would recommend we get a new audiogram to evaluate the severity of hearing loss. - Ordered audiogram to reassess hearing loss. - Scheduled follow-up visit to review audiogram results and discuss further management.  Xerostomia She reports new onset persistent xerostomia and decreased salivary production, which is bothersome and impacts her quality of life. No history of head and neck radiation.  Lozenges are only partially helpful. -Trial Salagen    See below regarding exact medications prescribed this encounter including dosages and route: Meds ordered this encounter  Medications   pilocarpine  (SALAGEN ) 5 MG tablet    Sig: Take 1 tablet (5 mg total) by mouth 2 (two) times daily.    Dispense:  60 tablet    Refill:  0    Thank you for allowing me the opportunity to care for your patient. Please do not hesitate to contact me should you have any other questions.  Sincerely, Hadassah Parody, MD Otolaryngologist (ENT), [facility] Phone: [phone] Fax: [phone]  MDM:  Level 4 Complexity/Problems addressed: 5-multiple chronic problems with severe progression Data complexity: 4-  independent review of CT scan - Morbidity: Prescription drug management - Prescription Drug prescribed or managed: y

## 2024-08-23 ENCOUNTER — Encounter: Payer: Self-pay | Admitting: Family Medicine

## 2024-08-24 ENCOUNTER — Telehealth (INDEPENDENT_AMBULATORY_CARE_PROVIDER_SITE_OTHER): Payer: Self-pay

## 2024-08-24 NOTE — Telephone Encounter (Signed)
 Patient stated she does not need the referral to [facility] she will just stay where she is at. She stated if anything changes she will let us  know

## 2024-08-29 ENCOUNTER — Encounter: Payer: Self-pay | Admitting: *Deleted

## 2024-08-29 ENCOUNTER — Telehealth: Admitting: Emergency Medicine

## 2024-08-29 DIAGNOSIS — R300 Dysuria: Secondary | ICD-10-CM

## 2024-08-29 NOTE — Progress Notes (Signed)
" °  Because of your age, I feel your condition warrants further evaluation and I recommend that you be seen in a face-to-face visit. I am very limited in what I can treat by evisit and I am not permitted to treat people over 65 by evisit. Often urinary tract infections in this age group are more complicated, and urine testing is needed. I am sorry I cannot help you by evisit.    NOTE: There will be NO CHARGE for this E-Visit   If you are having a true medical emergency, please call 911.     For an urgent face to face visit, [facility] has multiple urgent care centers for your convenience.  Click the link below for the full list of locations and hours, walk-in wait times, appointment scheduling options and driving directions:  Urgent Care - [place], [place], [place], [place], [place], [place]  [place]     Your MyChart E-visit questionnaire answers were reviewed by a board certified advanced clinical practitioner to complete your personal care plan based on your specific symptoms.    Thank you for using e-Visits.    "

## 2024-08-30 ENCOUNTER — Telehealth (INDEPENDENT_AMBULATORY_CARE_PROVIDER_SITE_OTHER): Payer: Self-pay

## 2024-08-30 NOTE — Telephone Encounter (Signed)
 Called patient and left a voicemail requesting a call back to reschedule the hearing evaluation appointment to same day, 09/26/2024 at 1:45pm with Dr. Lacinda.  Dr. Tiney will be out of office that afternoon. If that is ok, patient can keep follow up after with Dr. Masciello at 3pm.  Also sent a MyChart message.

## 2024-09-01 NOTE — Telephone Encounter (Signed)
 Has she tried all that was recommended: Breast pain may be improved by wearing adequate well-fitting support, over-the-counter topical and oral NSAID medication, low-fat diet, and ice/heat as needed. Studies have shown an improvement in cyclic pain with use of evening primrose oil, vitamin D  and vitamin E 400iu BID x 2 weeks.

## 2024-09-01 NOTE — Telephone Encounter (Signed)
 OV 03/24/24-JC  01/19/24 Bilateral Dx MMG and L BR US  reviewed, clinical f/u of left breast pain recommended.   Jami -please review and advise if ok to proceed with OV

## 2024-09-02 ENCOUNTER — Other Ambulatory Visit: Payer: Self-pay | Admitting: Cardiovascular Disease

## 2024-09-05 ENCOUNTER — Ambulatory Visit: Payer: Medicare (Managed Care)

## 2024-09-12 ENCOUNTER — Telehealth: Admitting: Physician Assistant

## 2024-09-12 DIAGNOSIS — H9202 Otalgia, left ear: Secondary | ICD-10-CM

## 2024-09-12 DIAGNOSIS — J029 Acute pharyngitis, unspecified: Secondary | ICD-10-CM | POA: Diagnosis not present

## 2024-09-12 MED ORDER — AMOXICILLIN 500 MG PO CAPS: 500.0000 mg | ORAL_CAPSULE | Freq: Two times a day (BID) | ORAL | 0 refills | Status: AC

## 2024-09-12 NOTE — Progress Notes (Signed)
 We are sorry that you are not feeling well.  Here is how we plan to help!  Based on what you have shared with me it is likely that you have strep pharyngitis.  Strep pharyngitis is a bacterial infection in the back of the throat causing inflammation, and is treated with antibiotics.  I have prescribed Amoxicillin 500 mg twice a day for 10 days. For throat pain, we recommend over the counter oral pain relief medications such as acetaminophen  or aspirin, or anti-inflammatory medications such as ibuprofen or naproxen sodium. Topical treatments such as oral throat lozenges or sprays may be used as needed. Strep infections are not as easily transmitted as other respiratory infections, however we still recommend that you avoid close contact with loved ones, especially the very young and elderly.  Remember to wash your hands thoroughly throughout the day as this is the number one way to prevent the spread of infection! We also recommend that you periodically wipe down door knobs and counters with disinfectant.   Home Care: Only take medications as instructed by your medical team. Complete the entire course of an antibiotic. Do not take these medications with alcohol. A steam or ultrasonic humidifier can help congestion.  You can place a towel over your head and breathe in the steam from hot water coming from a faucet. Avoid close contact with others, especially the very young and the elderly. Cover your mouth when you cough or sneeze. Always remember to wash your hands.  Get Help Right Away If: You develop worsening fever or sinus pain. You develop a severe head ache or visual changes. Your symptoms persist after you have completed your treatment plan.  Make sure you Understand these instructions. Will watch your condition. Will get help right away if you are not doing well or get worse.  Your e-visit answers were reviewed by a board certified advanced clinical practitioner to complete your personal  care plan.  Depending on the condition, your plan could have included both over the counter or prescription medications.  If there is a problem, please reply once you have received a response from your provider.  Your safety is important to us .  If you have drug allergies check your prescription carefully.    You can use MyChart to ask questions about today's visit, request a non-urgent call back, or ask for a work or school excuse for 24 hours related to this e-Visit. If it has been greater than 24 hours you will need to follow up with your provider, or enter a new e-Visit to address those concerns.  You will get an e-mail in the next two days asking about your experience.  I hope that your e-visit has been valuable and will speed your recovery. Thank you for using e-visits.   I have spent 5 minutes in review of e-visit questionnaire, review and updating patient chart, medical decision making and response to patient.   Delon CHRISTELLA Dickinson, PA-C

## 2024-09-19 ENCOUNTER — Other Ambulatory Visit: Payer: Self-pay | Admitting: Family Medicine

## 2024-09-19 DIAGNOSIS — F418 Other specified anxiety disorders: Secondary | ICD-10-CM

## 2024-09-26 ENCOUNTER — Ambulatory Visit (INDEPENDENT_AMBULATORY_CARE_PROVIDER_SITE_OTHER)

## 2024-09-26 ENCOUNTER — Ambulatory Visit (INDEPENDENT_AMBULATORY_CARE_PROVIDER_SITE_OTHER): Admitting: Audiology

## 2024-10-24 ENCOUNTER — Ambulatory Visit
